# Patient Record
Sex: Female | Born: 1935 | Race: White | Hispanic: No | Smoking: Former smoker
Health system: Southern US, Community
[De-identification: ages and names within clinical notes are randomized; demographics above are authoritative.]

## PROBLEM LIST (undated history)

## (undated) DIAGNOSIS — K219 Gastro-esophageal reflux disease without esophagitis: Secondary | ICD-10-CM

## (undated) DIAGNOSIS — I700 Atherosclerosis of aorta: Secondary | ICD-10-CM

## (undated) DIAGNOSIS — Z7901 Long term (current) use of anticoagulants: Secondary | ICD-10-CM

## (undated) DIAGNOSIS — C3490 Malignant neoplasm of unspecified part of unspecified bronchus or lung: Secondary | ICD-10-CM

## (undated) DIAGNOSIS — I5022 Chronic systolic (congestive) heart failure: Secondary | ICD-10-CM

## (undated) DIAGNOSIS — M810 Age-related osteoporosis without current pathological fracture: Secondary | ICD-10-CM

## (undated) DIAGNOSIS — Z86718 Personal history of other venous thrombosis and embolism: Secondary | ICD-10-CM

## (undated) DIAGNOSIS — I484 Atypical atrial flutter: Secondary | ICD-10-CM

## (undated) DIAGNOSIS — J449 Chronic obstructive pulmonary disease, unspecified: Secondary | ICD-10-CM

## (undated) DIAGNOSIS — S329XXA Fracture of unspecified parts of lumbosacral spine and pelvis, initial encounter for closed fracture: Secondary | ICD-10-CM

## (undated) DIAGNOSIS — M4850XA Collapsed vertebra, not elsewhere classified, site unspecified, initial encounter for fracture: Secondary | ICD-10-CM

## (undated) DIAGNOSIS — G629 Polyneuropathy, unspecified: Secondary | ICD-10-CM

## (undated) DIAGNOSIS — Z9889 Other specified postprocedural states: Secondary | ICD-10-CM

## (undated) DIAGNOSIS — T7840XA Allergy, unspecified, initial encounter: Secondary | ICD-10-CM

## (undated) DIAGNOSIS — Z9289 Personal history of other medical treatment: Secondary | ICD-10-CM

## (undated) DIAGNOSIS — C3491 Malignant neoplasm of unspecified part of right bronchus or lung: Secondary | ICD-10-CM

## (undated) DIAGNOSIS — I255 Ischemic cardiomyopathy: Secondary | ICD-10-CM

## (undated) DIAGNOSIS — I10 Essential (primary) hypertension: Secondary | ICD-10-CM

## (undated) DIAGNOSIS — Z9221 Personal history of antineoplastic chemotherapy: Secondary | ICD-10-CM

## (undated) DIAGNOSIS — J9601 Acute respiratory failure with hypoxia: Secondary | ICD-10-CM

## (undated) DIAGNOSIS — R2689 Other abnormalities of gait and mobility: Secondary | ICD-10-CM

## (undated) DIAGNOSIS — T4145XA Adverse effect of unspecified anesthetic, initial encounter: Secondary | ICD-10-CM

## (undated) DIAGNOSIS — I2510 Atherosclerotic heart disease of native coronary artery without angina pectoris: Secondary | ICD-10-CM

## (undated) DIAGNOSIS — I219 Acute myocardial infarction, unspecified: Secondary | ICD-10-CM

## (undated) DIAGNOSIS — M1990 Unspecified osteoarthritis, unspecified site: Secondary | ICD-10-CM

## (undated) DIAGNOSIS — Z9981 Dependence on supplemental oxygen: Secondary | ICD-10-CM

## (undated) DIAGNOSIS — J189 Pneumonia, unspecified organism: Secondary | ICD-10-CM

## (undated) DIAGNOSIS — I471 Supraventricular tachycardia, unspecified: Secondary | ICD-10-CM

## (undated) DIAGNOSIS — Z923 Personal history of irradiation: Secondary | ICD-10-CM

## (undated) DIAGNOSIS — E039 Hypothyroidism, unspecified: Secondary | ICD-10-CM

## (undated) DIAGNOSIS — I480 Paroxysmal atrial fibrillation: Secondary | ICD-10-CM

## (undated) DIAGNOSIS — E785 Hyperlipidemia, unspecified: Secondary | ICD-10-CM

## (undated) DIAGNOSIS — Z950 Presence of cardiac pacemaker: Secondary | ICD-10-CM

## (undated) DIAGNOSIS — I340 Nonrheumatic mitral (valve) insufficiency: Secondary | ICD-10-CM

## (undated) DIAGNOSIS — T8859XA Other complications of anesthesia, initial encounter: Secondary | ICD-10-CM

## (undated) HISTORY — DX: Malignant neoplasm of unspecified part of right bronchus or lung: C34.91

## (undated) HISTORY — PX: APPENDECTOMY: SHX54

## (undated) HISTORY — PX: ECTOPIC PREGNANCY SURGERY: SHX613

## (undated) HISTORY — DX: Chronic systolic (congestive) heart failure: I50.22

## (undated) HISTORY — DX: Collapsed vertebra, not elsewhere classified, site unspecified, initial encounter for fracture: M48.50XA

## (undated) HISTORY — DX: Malignant neoplasm of unspecified part of unspecified bronchus or lung: C34.90

## (undated) HISTORY — DX: Presence of cardiac pacemaker: Z95.0

## (undated) HISTORY — DX: Hyperlipidemia, unspecified: E78.5

## (undated) HISTORY — DX: Fracture of unspecified parts of lumbosacral spine and pelvis, initial encounter for closed fracture: S32.9XXA

## (undated) HISTORY — PX: VAGINAL HYSTERECTOMY: SUR661

## (undated) HISTORY — DX: Allergy, unspecified, initial encounter: T78.40XA

## (undated) HISTORY — DX: Essential (primary) hypertension: I10

## (undated) HISTORY — DX: Acute respiratory failure with hypoxia: J96.01

## (undated) HISTORY — DX: Personal history of other medical treatment: Z92.89

## (undated) HISTORY — DX: Atherosclerotic heart disease of native coronary artery without angina pectoris: I25.10

## (undated) HISTORY — PX: EYE SURGERY: SHX253

## (undated) HISTORY — DX: Other specified postprocedural states: Z98.890

---

## 2000-08-30 DIAGNOSIS — Z951 Presence of aortocoronary bypass graft: Secondary | ICD-10-CM

## 2000-08-30 DIAGNOSIS — I219 Acute myocardial infarction, unspecified: Secondary | ICD-10-CM

## 2000-08-30 HISTORY — DX: Acute myocardial infarction, unspecified: I21.9

## 2000-08-30 HISTORY — DX: Presence of aortocoronary bypass graft: Z95.1

## 2000-08-30 HISTORY — PX: CORONARY ANGIOPLASTY WITH STENT PLACEMENT: SHX49

## 2000-09-30 HISTORY — PX: CORONARY ARTERY BYPASS GRAFT: SHX141

## 2001-12-28 DIAGNOSIS — I82402 Acute embolism and thrombosis of unspecified deep veins of left lower extremity: Secondary | ICD-10-CM

## 2001-12-28 HISTORY — DX: Acute embolism and thrombosis of unspecified deep veins of left lower extremity: I82.402

## 2009-01-30 ENCOUNTER — Ambulatory Visit: Payer: Self-pay | Admitting: Family Medicine

## 2009-12-28 HISTORY — PX: COLONOSCOPY: SHX174

## 2010-02-12 ENCOUNTER — Ambulatory Visit: Payer: Self-pay | Admitting: Family Medicine

## 2011-03-29 ENCOUNTER — Ambulatory Visit: Payer: Self-pay | Admitting: Family Medicine

## 2011-08-03 DIAGNOSIS — Z950 Presence of cardiac pacemaker: Secondary | ICD-10-CM | POA: Insufficient documentation

## 2011-08-03 DIAGNOSIS — I10 Essential (primary) hypertension: Secondary | ICD-10-CM | POA: Insufficient documentation

## 2011-08-31 DIAGNOSIS — Z9889 Other specified postprocedural states: Secondary | ICD-10-CM

## 2011-08-31 DIAGNOSIS — Z9289 Personal history of other medical treatment: Secondary | ICD-10-CM

## 2011-08-31 HISTORY — DX: Other specified postprocedural states: Z98.890

## 2011-08-31 HISTORY — DX: Personal history of other medical treatment: Z92.89

## 2012-03-30 HISTORY — PX: PACEMAKER INSERTION: SHX728

## 2012-04-07 ENCOUNTER — Ambulatory Visit: Payer: Self-pay | Admitting: Family Medicine

## 2012-04-24 DIAGNOSIS — I219 Acute myocardial infarction, unspecified: Secondary | ICD-10-CM | POA: Insufficient documentation

## 2012-04-24 DIAGNOSIS — J449 Chronic obstructive pulmonary disease, unspecified: Secondary | ICD-10-CM | POA: Insufficient documentation

## 2012-04-24 DIAGNOSIS — I459 Conduction disorder, unspecified: Secondary | ICD-10-CM | POA: Insufficient documentation

## 2013-05-17 ENCOUNTER — Ambulatory Visit: Payer: Self-pay | Admitting: Family Medicine

## 2013-06-07 ENCOUNTER — Ambulatory Visit: Payer: Self-pay | Admitting: Family Medicine

## 2013-06-18 ENCOUNTER — Ambulatory Visit: Payer: Self-pay | Admitting: Physician Assistant

## 2013-06-29 ENCOUNTER — Ambulatory Visit: Payer: Self-pay | Admitting: Physician Assistant

## 2013-07-02 ENCOUNTER — Ambulatory Visit: Payer: Self-pay | Admitting: Physician Assistant

## 2013-07-27 HISTORY — PX: SVT ABLATION: EP1225

## 2013-08-30 DIAGNOSIS — Z9289 Personal history of other medical treatment: Secondary | ICD-10-CM

## 2013-08-30 HISTORY — DX: Personal history of other medical treatment: Z92.89

## 2013-11-16 DIAGNOSIS — I4892 Unspecified atrial flutter: Secondary | ICD-10-CM | POA: Insufficient documentation

## 2014-08-28 ENCOUNTER — Ambulatory Visit: Payer: Self-pay | Admitting: Family Medicine

## 2014-09-09 ENCOUNTER — Ambulatory Visit (INDEPENDENT_AMBULATORY_CARE_PROVIDER_SITE_OTHER): Payer: Medicare Other | Admitting: Internal Medicine

## 2014-09-09 ENCOUNTER — Encounter: Payer: Self-pay | Admitting: Internal Medicine

## 2014-09-09 VITALS — BP 144/80 | HR 84 | Temp 97.8°F | Ht 67.0 in | Wt 168.0 lb

## 2014-09-09 DIAGNOSIS — R0600 Dyspnea, unspecified: Secondary | ICD-10-CM

## 2014-09-09 DIAGNOSIS — R059 Cough, unspecified: Secondary | ICD-10-CM | POA: Insufficient documentation

## 2014-09-09 DIAGNOSIS — R05 Cough: Secondary | ICD-10-CM

## 2014-09-09 MED ORDER — HYDROCODONE-ACETAMINOPHEN 5-325 MG PO TABS: 1.0000 | ORAL_TABLET | Freq: Four times a day (QID) | ORAL | Status: DC | PRN

## 2014-09-09 NOTE — Progress Notes (Signed)
Date: 09/09/2014  MRN# [id number] Jenny Cooper [birth year]  Referring Physician: [name] PMD - Dr. Gayland Curry Jenny Cooper is a 79 y.o. old female seen in consultation for chronic cough  CC:  Chief Complaint  Jenny Cooper presents with  . Pulmonary Consult    Referred by [name] for persistent cough.    HPI:  Jenny Cooper is a pleasant 79 year old female presents today for a evaluation of chronic cough. She is accompanied by her daughter. Previous records, images, labs reviewed. History of cough as stated below: Cough is productive, clear thick to foamy at times, accompanied with coughing spells. There are No known exacerbating factors, not worse in the mornings or laying flat. Cough started - 05/2013 treated with some OTC med, got worst in Summer 2015 (daily), [name] started PPI, with little improvement. Then had allergy skin testing, by [name], which showed no significant allergens, was placed on Astelin and Benzonate, qvar, albuterol. Self referred to [name] in November 2015, had 2 laryngoscopies (1st scope - posterior laryngeal erythema, taken off pantoprazole and place on prilosec, 2nd scope on 08/26/14- resolution of laryngeal erythema, but Jenny Cooper still had cough, even while on prilosec). Jenny Cooper describes the cough as "liquid feeling in thorat" and "throat clearing feeling." Has experienced post tussive enemsis, especially after a coughing spell. Currently only taking Prilosec and Astelin.  No wheezing, only after a coughing spell, and then its more in the throat area per the Jenny Cooper. Jenny Cooper also endorses some dyspnea, mostly after a coughing spell.  PMHX:   Past Medical History  Diagnosis Date  . HTN (hypertension)   . Pacemaker   . Coronary artery disease   . HLD (hyperlipidemia)    Surgical Hx:  Past Surgical History  Procedure Laterality Date  . Appendectomy    . Vaginal hysterectomy    . Cardiac bypass     Family Hx:  Family  History  Problem Relation Age of Onset  . COPD Mother     sister, and brother   Social Hx:   History  Substance Use Topics  . Smoking status: Former Smoker -- 2.00 packs/day for 42 years    Types: Cigarettes    Quit date: 08/30/2000  . Smokeless tobacco: Not on file  . Alcohol Use: 6.0 oz/week    10 Glasses of wine per week     Comment: 2 glasses of wine per day   Medication:   Current Outpatient Rx  Name  Route  Sig  Dispense  Refill  . aspirin 81 MG tablet   Oral   Take 81 mg by mouth daily.         Marland Kitchen atenolol (TENORMIN) 25 MG tablet   Oral   Take 25 mg by mouth daily.         Marland Kitchen atorvastatin (LIPITOR) 20 MG tablet   Oral   Take 20 mg by mouth daily.         Marland Kitchen azelastine (ASTELIN) 0.1 % nasal spray   Each Nare   Place 2 sprays into both nostrils 2 (two) times daily. Use in each nostril as directed         . benzonatate (TESSALON) 200 MG capsule   Oral   Take 200 mg by mouth 3 (three) times daily as needed for cough.         . calcium carbonate (OS-CAL) 600 MG TABS tablet   Oral   Take 600 mg by mouth 2 (two) times daily with a meal.         .  ELIQUIS 5 MG TABS tablet   Oral   Take 1 tablet by mouth daily.      11   . hydrochlorothiazide (MICROZIDE) 12.5 MG capsule   Oral   Take 12.5 mg by mouth daily.         Marland Kitchen levothyroxine (SYNTHROID, LEVOTHROID) 75 MCG tablet   Oral   Take 75 mcg by mouth daily before breakfast.         . losartan (COZAAR) 25 MG tablet   Oral   Take 25 mg by mouth daily.         . Multiple Vitamin (MULTIVITAMIN) tablet   Oral   Take 1 tablet by mouth daily.         Marland Kitchen omeprazole (PRILOSEC) 20 MG capsule   Oral   Take 20 mg by mouth daily.             Allergies:  Lovenox  Review of Systems: Gen:  Denies  fever, sweats, chills HEENT: Denies blurred vision, double vision, ear pain, eye pain, hearing loss, nose bleeds, sore throat Cvc:  No dizziness, chest pain or heaviness Resp:   Cough (mild  production, foamy at times) Gi: Denies swallowing difficulty, stomach pain, nausea or vomiting, diarrhea, constipation, bowel incontinence Gu:  Denies bladder incontinence, burning urine Ext:   No Joint pain, stiffness or swelling Skin: No skin rash, easy bruising or bleeding or hives Endoc:  No polyuria, polydipsia , polyphagia or weight change Psych: No depression, insomnia or hallucinations  Other:  All other systems negative  Physical Examination:   VS: BP 144/80 mmHg  Pulse 84  Temp(Src) 97.8 F (36.6 C) (Oral)  Ht 5\' 7"  (1.702 m)  Wt 168 lb (76.204 kg)  BMI 26.31 kg/m2  SpO2 95%  General Appearance: No distress  Neuro:without focal findings, mental status, speech normal, alert and oriented, cranial nerves 2-12 intact, reflexes normal and symmetric, sensation grossly normal  HEENT: PERRLA, EOM intact, no ptosis, no other lesions noticed; Mallampati 2 Pulmonary: normal breath sounds., diaphragmatic excursion normal.No wheezing, No rales;   Sputum Production:  none CardiovascularNormal S1,S2.  No m/r/g.  Abdominal aorta pulsation normal.    Abdomen: Benign, Soft, non-tender, No masses, hepatosplenomegaly, No lymphadenopathy Renal:  No costovertebral tenderness  GU:  No performed at this time. Endoc: No evident thyromegaly, no signs of acromegaly or Cushing features Skin:   warm, no rashes, no ecchymosis  Extremities: normal, no cyanosis, clubbing, no edema, warm with normal capillary refill. Other findings:   Rad results: (The following images and results were reviewed by Dr. Stevenson Clinch). CXR 02/18/14 1. Stable heart and mediastinum post median sternotomy and mitral valve repair. There is a left-sided pacemaker with right atrial and right ventricular leads. The pacemaker generator has flipped position in comparison to prior. Recommend correlation for possible pacemaker pocket infection. 2. Right apical pleural thickening is unchanged. Lungs and pleural spaces are otherwise within  normal limits without acute abnormality.   ECHO 03/2014 MILD LV DYSFUNCTION  NORMAL RIGHT VENTRICULAR SYSTOLIC FUNCTION VALVULAR REGURGITATION: MILD MR, TRIVIAL PR, MILD TR PROSTHETIC VALVE(S): PROSTHETIC MV RING    Assessment and Plan: Cough The standardized cough guidelines published in Chest by Lissa Morales in 2006 are still the best available and consist of a multiple step process (up to 12!) , not a single office visit,  and are intended  to address this problem logically,  with an alogrithm dependent on response to empiric treatment at  each progressive step  to determine a  specific diagnosis with  minimal addtional testing needed. Therefore if adherence is an issue or can't be accurately verified,  it's very unlikely the standard evaluation and treatment will be successful here.    Furthermore, response to therapy (other than acute cough suppression, which should only be used short term with avoidance of narcotic containing cough syrups if possible), can be a gradual process for which the Jenny Cooper may perceive immediate benefit.  Unlike going to an eye doctor where the best perscription is almost always the first one and is immediately effective, this is almost never the case in the management of chronic cough syndromes. Therefore the Jenny Cooper needs to commit up front to consistently adhere to recommendations  for up to 6 weeks of therapy directed at the likely underlying problem(s) before the response can be reasonably evaluated.  Differential diagnosis includes: COPD, nonasthmatic eosinophilic bronchitis, asthma, recurrent allergy exposure Given her clinical symptoms and prolonged smoking history, COPD is high in the differential. However, will further evaluate obstructive, restrictive disease with pulmonary function testing. Given her recurrence of infections will also further evaluate lung parenchyma with CT chest with contrast. Will also check pulmonary function testing and 6 minute  walk test. For her coughing spells, will give a prescription for hydrocodone (Jenny Cooper educated and advise on usage, dosage, administration of this medication). Jenny Cooper advised to continue with Prilosec and Astelin.     Updated Medication List Outpatient Encounter Prescriptions as of 09/09/2014  Medication Sig  . aspirin 81 MG tablet Take 81 mg by mouth daily.  Marland Kitchen atenolol (TENORMIN) 25 MG tablet Take 25 mg by mouth daily.  Marland Kitchen atorvastatin (LIPITOR) 20 MG tablet Take 20 mg by mouth daily.  Marland Kitchen azelastine (ASTELIN) 0.1 % nasal spray Place 2 sprays into both nostrils 2 (two) times daily. Use in each nostril as directed  . benzonatate (TESSALON) 200 MG capsule Take 200 mg by mouth 3 (three) times daily as needed for cough.  . calcium carbonate (OS-CAL) 600 MG TABS tablet Take 600 mg by mouth 2 (two) times daily with a meal.  . ELIQUIS 5 MG TABS tablet Take 1 tablet by mouth daily.  . hydrochlorothiazide (MICROZIDE) 12.5 MG capsule Take 12.5 mg by mouth daily.  Marland Kitchen levothyroxine (SYNTHROID, LEVOTHROID) 75 MCG tablet Take 75 mcg by mouth daily before breakfast.  . losartan (COZAAR) 25 MG tablet Take 25 mg by mouth daily.  . Multiple Vitamin (MULTIVITAMIN) tablet Take 1 tablet by mouth daily.  Marland Kitchen omeprazole (PRILOSEC) 20 MG capsule Take 20 mg by mouth daily.  Marland Kitchen HYDROcodone-acetaminophen (NORCO/VICODIN) 5-325 MG per tablet Take 1 tablet by mouth every 6 (six) hours as needed (for cough).    Orders for this visit: Orders Placed This Encounter  Procedures  . CT Chest W Contrast    Standing Status: Future     Number of Occurrences:      Standing Expiration Date: 11/08/2015    Order Specific Question:  Reason for Exam (SYMPTOM  OR DIAGNOSIS REQUIRED)    Answer:  cough, shortness of breath    Order Specific Question:  Preferred imaging location?    Answer:  [facility]  . Basic Metabolic Panel (BMET)    Standing Status: Future     Number of Occurrences:      Standing Expiration Date: 09/09/2015     Order Specific Question:  Has the Jenny Cooper fasted?    Answer:  No  . Pulmonary function test    Standing Status: Future     Number of  Occurrences:      Standing Expiration Date: 09/10/2015    Order Specific Question:  Where should this test be performed?    Answer:  [facility]    Order Specific Question:  Full PFT: includes the following: basic spirometry, spirometry pre & post bronchodilator, diffusion capacity (DLCO), lung volumes    Answer:  Full PFT    Order Specific Question:  MIP/MEP    Answer:  No    Order Specific Question:  6 minute walk    Answer:  No    Order Specific Question:  ABG    Answer:  No    Order Specific Question:  Diffusion capacity (DLCO)    Answer:  Yes    Order Specific Question:  Lung volumes    Answer:  Yes    Order Specific Question:  Methacholine challenge    Answer:  No     Thank  you for the consultation and for allowing [facility] to assist in the care of your Jenny Cooper. Our recommendations are noted above.  Please contact us if we can be of further service.   Vilinda Boehringer, MD [facility] Office Number: [phone]

## 2014-09-09 NOTE — Patient Instructions (Signed)
We will get you scheduled for a CT scan, Pulmonary Function Test and a Walk Test. Rx will be given to you for Hydrocodone to help with your cough. Follow up with Dr. Stevenson Clinch in 2 weeks.

## 2014-09-10 NOTE — Assessment & Plan Note (Signed)
The standardized cough guidelines published in Chest by Lissa Morales in 2006 are still the best available and consist of a multiple step process (up to 12!) , not a single office visit,  and are intended  to address this problem logically,  with an alogrithm dependent on response to empiric treatment at  each progressive step  to determine a specific diagnosis with  minimal addtional testing needed. Therefore if adherence is an issue or can't be accurately verified,  it's very unlikely the standard evaluation and treatment will be successful here.    Furthermore, response to therapy (other than acute cough suppression, which should only be used short term with avoidance of narcotic containing cough syrups if possible), can be a gradual process for which the patient may perceive immediate benefit.  Unlike going to an eye doctor where the best perscription is almost always the first one and is immediately effective, this is almost never the case in the management of chronic cough syndromes. Therefore the patient needs to commit up front to consistently adhere to recommendations  for up to 6 weeks of therapy directed at the likely underlying problem(s) before the response can be reasonably evaluated.  Differential diagnosis includes: COPD, nonasthmatic eosinophilic bronchitis, asthma, recurrent allergy exposure Given her clinical symptoms and prolonged smoking history, COPD is high in the differential. However, will further evaluate obstructive, restrictive disease with pulmonary function testing. Given her recurrence of infections will also further evaluate lung parenchyma with CT chest with contrast. Will also check pulmonary function testing and 6 minute walk test. For her coughing spells, will give a prescription for hydrocodone (patient educated and advise on usage, dosage, administration of this medication). Patient advised to continue with Prilosec and Astelin.

## 2014-09-11 ENCOUNTER — Encounter: Payer: Self-pay | Admitting: *Deleted

## 2014-09-12 ENCOUNTER — Telehealth: Payer: Self-pay | Admitting: Internal Medicine

## 2014-09-13 NOTE — Telephone Encounter (Signed)
Called [facility] schedulers and they changed appt to the [facility] office same day 09/17/14@9am  pt aware Joellen Jersey

## 2014-09-17 ENCOUNTER — Other Ambulatory Visit: Payer: Self-pay | Admitting: Internal Medicine

## 2014-09-17 ENCOUNTER — Ambulatory Visit: Payer: Self-pay | Admitting: Internal Medicine

## 2014-09-17 ENCOUNTER — Telehealth: Payer: Self-pay | Admitting: Internal Medicine

## 2014-09-17 DIAGNOSIS — R918 Other nonspecific abnormal finding of lung field: Secondary | ICD-10-CM

## 2014-09-17 NOTE — Telephone Encounter (Signed)
Called and spoke with patient informed her of the results of her CT Scan - Right lung mass, with possible endobronchial lesion. Plan to perform follow up PET-CT scan. Patient in agreement with above plan.

## 2014-09-17 NOTE — Telephone Encounter (Signed)
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  This causes post obstructive  Inflammation changes in the right upper lobe and superior segment of right lower lobe."  Will forward message to VM

## 2014-09-17 NOTE — Telephone Encounter (Signed)
Called and spoke with patient.  See phone note.

## 2014-09-19 ENCOUNTER — Ambulatory Visit: Payer: Self-pay | Admitting: Oncology

## 2014-09-19 LAB — COMPREHENSIVE METABOLIC PANEL
ALBUMIN: 3.6 g/dL (ref 3.4–5.0)
AST: 29 U/L (ref 15–37)
Alkaline Phosphatase: 79 U/L
Anion Gap: 6 — ABNORMAL LOW (ref 7–16)
BUN: 18 mg/dL (ref 7–18)
Bilirubin,Total: 0.3 mg/dL (ref 0.2–1.0)
CO2: 31 mmol/L (ref 21–32)
Calcium, Total: 9.2 mg/dL (ref 8.5–10.1)
Chloride: 100 mmol/L (ref 98–107)
Creatinine: 0.95 mg/dL (ref 0.60–1.30)
EGFR (Non-African Amer.): >60
GLUCOSE: 104 mg/dL — AB (ref 65–99)
Osmolality: 276 (ref 275–301)
Potassium: 3.6 mmol/L (ref 3.5–5.1)
SGPT (ALT): 19 U/L
Sodium: 137 mmol/L (ref 136–145)
Total Protein: 7.9 g/dL (ref 6.4–8.2)

## 2014-09-19 LAB — CBC CANCER CENTER
BASOS ABS: 0 x10 3/mm (ref 0.0–0.1)
Basophil %: 0.6 %
Eosinophil #: 0.6 x10 3/mm (ref 0.0–0.7)
Eosinophil %: 8.7 %
HCT: 42.2 % (ref 35.0–47.0)
HGB: 13.8 g/dL (ref 12.0–16.0)
Lymphocyte #: 1.4 x10 3/mm (ref 1.0–3.6)
Lymphocyte %: 20.6 %
MCH: 29.4 pg (ref 26.0–34.0)
MCHC: 32.8 g/dL (ref 32.0–36.0)
MCV: 90 fL (ref 80–100)
MONO ABS: 0.9 x10 3/mm (ref 0.2–0.9)
Monocyte %: 13.3 %
NEUTROS ABS: 3.9 x10 3/mm (ref 1.4–6.5)
Neutrophil %: 56.8 %
PLATELETS: 229 x10 3/mm (ref 150–440)
RBC: 4.7 10*6/uL (ref 3.80–5.20)
RDW: 13.7 % (ref 11.5–14.5)
WBC: 6.9 x10 3/mm (ref 3.6–11.0)

## 2014-09-19 LAB — APTT: Activated PTT: 42.6 secs — ABNORMAL HIGH (ref 23.6–35.9)

## 2014-09-19 LAB — PROTIME-INR
INR: 1.1
PROTHROMBIN TIME: 14.2 s (ref 11.5–14.7)

## 2014-09-23 ENCOUNTER — Ambulatory Visit: Payer: Self-pay | Admitting: Internal Medicine

## 2014-09-23 ENCOUNTER — Ambulatory Visit: Payer: Self-pay | Admitting: Oncology

## 2014-09-23 DIAGNOSIS — I213 ST elevation (STEMI) myocardial infarction of unspecified site: Secondary | ICD-10-CM

## 2014-09-24 ENCOUNTER — Encounter: Payer: Self-pay | Admitting: Internal Medicine

## 2014-09-24 DIAGNOSIS — J9601 Acute respiratory failure with hypoxia: Secondary | ICD-10-CM

## 2014-09-24 DIAGNOSIS — R918 Other nonspecific abnormal finding of lung field: Secondary | ICD-10-CM

## 2014-09-25 ENCOUNTER — Ambulatory Visit: Payer: PRIVATE HEALTH INSURANCE

## 2014-09-25 ENCOUNTER — Inpatient Hospital Stay: Payer: Self-pay | Admitting: Specialist

## 2014-09-25 ENCOUNTER — Other Ambulatory Visit: Payer: Self-pay | Admitting: *Deleted

## 2014-09-25 DIAGNOSIS — R059 Cough, unspecified: Secondary | ICD-10-CM

## 2014-09-25 DIAGNOSIS — R0600 Dyspnea, unspecified: Secondary | ICD-10-CM

## 2014-09-25 DIAGNOSIS — R918 Other nonspecific abnormal finding of lung field: Secondary | ICD-10-CM | POA: Diagnosis not present

## 2014-09-25 DIAGNOSIS — J9601 Acute respiratory failure with hypoxia: Secondary | ICD-10-CM | POA: Diagnosis not present

## 2014-09-25 DIAGNOSIS — R05 Cough: Secondary | ICD-10-CM

## 2014-09-26 ENCOUNTER — Ambulatory Visit: Payer: PRIVATE HEALTH INSURANCE | Admitting: Internal Medicine

## 2014-09-29 LAB — BRONCHIAL WASH CULTURE

## 2014-09-30 ENCOUNTER — Ambulatory Visit: Payer: Self-pay | Admitting: Oncology

## 2014-10-02 ENCOUNTER — Telehealth: Payer: Self-pay | Admitting: *Deleted

## 2014-10-02 NOTE — Telephone Encounter (Signed)
Pt needs to reschedule 6 min walk and pft, the f/u appt with Dr. Stevenson Clinch next week.

## 2014-10-04 ENCOUNTER — Telehealth: Payer: Self-pay | Admitting: Internal Medicine

## 2014-10-04 DIAGNOSIS — C3490 Malignant neoplasm of unspecified part of unspecified bronchus or lung: Secondary | ICD-10-CM | POA: Insufficient documentation

## 2014-10-04 DIAGNOSIS — G64 Other disorders of peripheral nervous system: Secondary | ICD-10-CM | POA: Insufficient documentation

## 2014-10-04 DIAGNOSIS — M5412 Radiculopathy, cervical region: Secondary | ICD-10-CM | POA: Insufficient documentation

## 2014-10-04 NOTE — Telephone Encounter (Signed)
atc pt's daughter Celene Skeen, line busy.  Wcb.

## 2014-10-07 NOTE — Telephone Encounter (Signed)
lmtcb x1 

## 2014-10-08 NOTE — Telephone Encounter (Signed)
We will have to schedule for the next available. The 15th is already booked for PFT and SMW.

## 2014-10-08 NOTE — Telephone Encounter (Signed)
lmtcb for daughter 

## 2014-10-08 NOTE — Telephone Encounter (Signed)
° °  Jenny Cooper returned call [phone]

## 2014-10-08 NOTE — Telephone Encounter (Signed)
Per Dr. Stevenson Clinch, he would still like patient to have PFT, SMW sooner the better. I spoke with the patient's daughter and she informed me that her mother will start chemo on 10/15/14. There is scheduling conflict as they would prefer to have it done on Monday 10/14/14. Ria Comment please help me figure this one out. Thanks.

## 2014-10-10 ENCOUNTER — Ambulatory Visit: Payer: Self-pay | Admitting: Vascular Surgery

## 2014-10-10 NOTE — Telephone Encounter (Signed)
Pt has been scheduled for Mon. Feb 15th, 2016.

## 2014-10-14 ENCOUNTER — Ambulatory Visit: Payer: PRIVATE HEALTH INSURANCE

## 2014-10-16 ENCOUNTER — Other Ambulatory Visit: Payer: Self-pay | Admitting: Internal Medicine

## 2014-10-16 ENCOUNTER — Telehealth: Payer: Self-pay | Admitting: *Deleted

## 2014-10-16 DIAGNOSIS — R0602 Shortness of breath: Secondary | ICD-10-CM

## 2014-10-16 NOTE — Telephone Encounter (Signed)
Due to our PFT machine being down in [place]. Patient is scheduled for PFT, SMW at [facility] 10/17/14 10:30. Pts daughter aware of change and will inform her mother.

## 2014-10-17 ENCOUNTER — Ambulatory Visit: Payer: PRIVATE HEALTH INSURANCE

## 2014-10-17 ENCOUNTER — Ambulatory Visit: Payer: Self-pay | Admitting: Internal Medicine

## 2014-10-21 LAB — PULMONARY FUNCTION TEST

## 2014-10-29 ENCOUNTER — Ambulatory Visit
Admit: 2014-10-29 | Disposition: A | Discharge status: Home / Self Care | Payer: Self-pay | Attending: Oncology | Admitting: Oncology

## 2014-11-26 LAB — CBC CANCER CENTER
BASOS ABS: 0 x10 3/mm (ref 0.0–0.1)
Basophil %: 0.6 %
EOS ABS: 0 x10 3/mm (ref 0.0–0.7)
Eosinophil %: 1.3 %
HCT: 34.4 % — ABNORMAL LOW (ref 35.0–47.0)
HGB: 11.4 g/dL — ABNORMAL LOW (ref 12.0–16.0)
Lymphocyte #: 0.4 x10 3/mm — ABNORMAL LOW (ref 1.0–3.6)
Lymphocyte %: 20.4 %
MCH: 29.2 pg (ref 26.0–34.0)
MCHC: 33.1 g/dL (ref 32.0–36.0)
MCV: 88 fL (ref 80–100)
Monocyte #: 0.3 x10 3/mm (ref 0.2–0.9)
Monocyte %: 16.5 %
NEUTROS ABS: 1.2 x10 3/mm — AB (ref 1.4–6.5)
Neutrophil %: 61.2 %
PLATELETS: 155 x10 3/mm (ref 150–440)
RBC: 3.91 10*6/uL (ref 3.80–5.20)
RDW: 15.9 % — ABNORMAL HIGH (ref 11.5–14.5)
WBC: 2 x10 3/mm — CL (ref 3.6–11.0)

## 2014-11-26 LAB — BASIC METABOLIC PANEL
Anion Gap: 2 — ABNORMAL LOW (ref 7–16)
BUN: 20 mg/dL
CREATININE: 0.89 mg/dL
Calcium, Total: 8.5 mg/dL — ABNORMAL LOW
Chloride: 101 mmol/L
Co2: 31 mmol/L
EGFR (African American): >60
EGFR (Non-African Amer.): >60
GLUCOSE: 122 mg/dL — AB
POTASSIUM: 3.4 mmol/L — AB
SODIUM: 134 mmol/L — AB

## 2014-11-29 ENCOUNTER — Ambulatory Visit
Admit: 2014-11-29 | Disposition: A | Discharge status: Home / Self Care | Payer: Self-pay | Attending: Oncology | Admitting: Oncology

## 2014-11-30 LAB — CREATININE, SERUM: Creatine, Serum: 0.89

## 2014-12-02 LAB — BASIC METABOLIC PANEL
Anion Gap: 10 (ref 7–16)
BUN: 37 mg/dL — ABNORMAL HIGH
CHLORIDE: 95 mmol/L — AB
Calcium, Total: 9.7 mg/dL
Co2: 27 mmol/L
Creatinine: 1.37 mg/dL — ABNORMAL HIGH
EGFR (African American): 43 — ABNORMAL LOW
EGFR (Non-African Amer.): 37 — ABNORMAL LOW
Glucose: 167 mg/dL — ABNORMAL HIGH
POTASSIUM: 3.7 mmol/L
SODIUM: 132 mmol/L — AB

## 2014-12-02 LAB — CBC CANCER CENTER
Basophil #: 0 x10 3/mm (ref 0.0–0.1)
Basophil %: 0.8 %
EOS PCT: 0.9 %
Eosinophil #: 0 x10 3/mm (ref 0.0–0.7)
HCT: 37.5 % (ref 35.0–47.0)
HGB: 12.6 g/dL (ref 12.0–16.0)
LYMPHS PCT: 16.4 %
Lymphocyte #: 0.4 x10 3/mm — ABNORMAL LOW (ref 1.0–3.6)
MCH: 29.6 pg (ref 26.0–34.0)
MCHC: 33.6 g/dL (ref 32.0–36.0)
MCV: 88 fL (ref 80–100)
MONO ABS: 0.4 x10 3/mm (ref 0.2–0.9)
Monocyte %: 15.3 %
NEUTROS PCT: 66.6 %
Neutrophil #: 1.8 x10 3/mm (ref 1.4–6.5)
PLATELETS: 166 x10 3/mm (ref 150–440)
RBC: 4.26 10*6/uL (ref 3.80–5.20)
RDW: 16.9 % — ABNORMAL HIGH (ref 11.5–14.5)
WBC: 2.7 x10 3/mm — ABNORMAL LOW (ref 3.6–11.0)

## 2014-12-23 LAB — CYTOLOGY - NON PAP

## 2014-12-24 LAB — COMPREHENSIVE METABOLIC PANEL
ALBUMIN: 3.9 g/dL
ALK PHOS: 65 U/L
Anion Gap: 6 — ABNORMAL LOW (ref 7–16)
BUN: 17 mg/dL
Bilirubin,Total: 0.4 mg/dL
CALCIUM: 9.5 mg/dL
CREATININE: 1 mg/dL
Chloride: 98 mmol/L — ABNORMAL LOW
Co2: 32 mmol/L
EGFR (African American): >60
EGFR (Non-African Amer.): 55 — ABNORMAL LOW
Glucose: 107 mg/dL — ABNORMAL HIGH
POTASSIUM: 3.6 mmol/L
SGOT(AST): 22 U/L
SGPT (ALT): 19 U/L
Sodium: 136 mmol/L
Total Protein: 7.4 g/dL

## 2014-12-24 LAB — CBC CANCER CENTER
BASOS PCT: 0.5 %
Basophil #: 0 x10 3/mm (ref 0.0–0.1)
EOS PCT: 5.7 %
Eosinophil #: 0.2 x10 3/mm (ref 0.0–0.7)
HCT: 36.8 % (ref 35.0–47.0)
HGB: 12.2 g/dL (ref 12.0–16.0)
LYMPHS ABS: 1 x10 3/mm (ref 1.0–3.6)
Lymphocyte %: 28.4 %
MCH: 30.5 pg (ref 26.0–34.0)
MCHC: 33.2 g/dL (ref 32.0–36.0)
MCV: 92 fL (ref 80–100)
MONO ABS: 0.8 x10 3/mm (ref 0.2–0.9)
MONOS PCT: 21.6 %
Neutrophil #: 1.6 x10 3/mm (ref 1.4–6.5)
Neutrophil %: 43.8 %
PLATELETS: 191 x10 3/mm (ref 150–440)
RBC: 4.01 10*6/uL (ref 3.80–5.20)
RDW: 21.9 % — ABNORMAL HIGH (ref 11.5–14.5)
WBC: 3.6 x10 3/mm (ref 3.6–11.0)

## 2014-12-25 ENCOUNTER — Other Ambulatory Visit: Payer: Self-pay | Admitting: Oncology

## 2014-12-25 DIAGNOSIS — C3490 Malignant neoplasm of unspecified part of unspecified bronchus or lung: Secondary | ICD-10-CM

## 2014-12-29 NOTE — Discharge Summary (Signed)
PATIENT NAME:  Jenny Cooper, Jenny Cooper MR#:  [id number] DATE OF BIRTH:  01-Aug-1936  DATE OF ADMISSION:  09/25/2014 DATE OF DISCHARGE:  09/25/2014  DISCHARGE DIAGNOSES: 1. Hypoxia after bronchoscopy.  2. Chronic obstructive pulmonary disease.  3. Coronary artery disease with coronary artery bypass graft.  4. Hypertension.  5. permanent  atrial fibrillation.  6. Gastroesophageal reflux disease.  7. Hyperlipidemia.   DISCHARGE MEDICATIONS: Aspirin 81 mg p.o. daily,atenolol '25mg'$   p.o. daily, atorvastatin 20 mg p.o. at bedtime, calcium carbonate 600 mg p.o. b.i.d., Eliquis 5 mg p.o. daily, hydrochlorothiazide 12.5 mg p.o. daily, levothyroxine 75 mcg p.o. daily, losartan 25 mg p.o. daily, Norco 5/325 one tablet every 6 hours as needed, omeprazole 20 mg p.o. daily.   HOME OXYGEN: Discharged home with 2 liters of oxygen.   FOLLOWUP APPOINTMENTS: Follow up with Dr. Stevenson Clinch in about 10 days, regarding biopsy results. Also, her primary doctor is Dr. Gayland Curry, advised her to follow up with her in 10 days.   CONSULTATIONS: Dr. Stevenson Clinch.  HOSPITAL COURSE: This patient is a 79 year old female patient, who came to the hospital for elective bronchoscopy. The patient has a right upper lobe lung mass, and she had a bronchoscopy done by Dr. Stevenson Clinch. After the bronchoscopy, she had hypoxia, where saturations dropped 70s to 80s on room air. The patient was not on oxygen before. At the same time, she became cyanotic with low oxygenation, so hospitalist was consulted for admission. The patient's was put on 3 liters with saturations of 97%. The patient thought to have acute hypoxia, probably related to mild atelectasis versus chronic obstructive pulmonary disease, and the patient was continued on incentive spirometer, DuoNeb, Combivent. The patient seen by Dr. Stevenson Clinch. She had a lot of sore throats due to bronchoscopy, so she wanted Cepacol lozenges, which we started. The patient was seen by physical therapy. On ambulation,  her saturation dropped, so she continued to require oxygen 2 liters support. On room air, saturations were 84%, and they quickly went up to 92% on 2 liters.   Concerning her hypoxia, we did a CT angiogram of the chest to evaluate for pulmonary embolism and CT angiogram chest was negative for pulmonary embolus. The patient did not have any cough or wheezing. The patient was seen by Dr. Stevenson Clinch. He said he will take care of her biopsy results and also weaning off oxygen in the clinic. The patient discharged home with oxygen 2 liters. The patient did not have any fever. Given the patient's CT chest showed pneumonia, she did not have any fever, and it could be post obstructive pneumonia.   PHYSICAL EXAMINATION ON DISCHARGE: DISCHARGE VITAL SIGNS: Temperature 97.8, heart rate 63, blood pressure 128/68, saturations 94% on 2 liters and 84% on room air with exertion. Discharged home with oxygen.  CARDIOVASCULAR: S1, S2.  LUNGS: Clear to auscultation. No wheeze. No rales.  ABDOMEN: Soft, nontender, nondistended. Bowel sounds present.   The patient was taking Eliquis before for atrial fibrillation, so we advised her to continue Eliquis.   TIME SPENT: More than 30 minutes    ____________________________ Epifanio Lesches, MD sk:mw D: 09/28/2014 09:54:02 ET T: 09/28/2014 19:51:28 ET JOB#: [id number]  cc: Epifanio Lesches, MD, <Dictator> Vilinda Boehringer, MD Floria Raveling. Astrid Divine, MD Epifanio Lesches MD ELECTRONICALLY SIGNED 10/01/2014 13:40

## 2014-12-29 NOTE — Consult Note (Signed)
Reason for Visit: This 79 year old Female patient presents to the clinic for initial evaluation of  lung cancer .   Referred by Dr. Oliva Bustard.  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 Tumor invaded the right mainstem bronchus with narrowing of vascular structures. PET CT confirmed a 1-7/5 cm hyperbolic upper hypermetabolic mass in the right perihilar area contiguous with adjacent hilar structures with underlying right hilar malignant adenopathy.patient underwent and bronchial ultrasound demonstrated a squamous cell carcinoma involving 4R station lymph nodes with extrinsic compression of the right mainstem bronchus. She was [facility] after her bronchitis secondary to hypoxia and currently is on oxygen therapy. She has a past medical history significant for significant coronary artery disease, DVT and pulmonary embolism. She's been seen by medical oncology and surgical oncology and is now referred to radiation oncology for opinion. She specifically denies hemoptysis at this time.  Past Hx:    CAD:    CAD:    HTN:    CABG (Coronary Artery Bypass Graft):   Past, Family and Social History:  Past Medical History positive   Cardiovascular CABG performed; coronary artery disease; hypertension   Respiratory COPD; pulmonary embolism   Past Surgical History appendectomy; fallopian tube removal, hysterectomy   Past Medical History Comments DVT   Family History positive    Family History Comments family history of brother with colon cancer multiple family members with COPD   Social History positive   Social History Comments 40 pack year smoking history quit smoking 15 years prior no EtOH use history   Additional Past Medical and Surgical History seen today accompanied by 2 daughters   Allergies:   Lovenox: Rash  Demerol HCl: Other  Home Meds:  Home Medications: Medication Instructions Status  albuterol-ipratropium 2.5 mg-0.5 mg/3 mL inhalation solution 3 milliliter(s) inhaled 2 times a day Active  Norco 325 mg-5 mg oral tablet 1 tab(s) orally every 6 hours, As Needed Active  Multivitamin - once a day 1   once a day Active  azelastine nasal 2 spray(s) nasal 2 times a day Active  omeprazole 20 mg oral delayed release capsule 1 cap(s) orally once a day (at bedtime) Active  aspirin 81 mg oral tablet 1 tab(s) orally once a day Active  atenolol 25 mg oral tablet 1 tab(s) orally once a day Active  atorvastatin 20 mg oral tablet 1 tab(s) orally once a day (at bedtime) Active  calcium carbonate 600 mg oral tablet  orally 2 times a day with a meal Active  Eliquis 5 mg oral tablet  orally once a day Active  hydrochlorothiazide 12.5 mg oral capsule 1 cap(s) orally once a day Active  levothyroxine 75 mcg (0.075 mg) oral tablet 1 tab(s) orally once a day Active  losartan 25 mg oral tablet 1 tab(s) orally once a day Active   Review of Systems:  General negative   Performance Status (ECOG) 0   Skin negative   Breast negative   Ophthalmologic negative   ENMT negative  Respiratory and Thorax see HPI   Cardiovascular see HPI   Gastrointestinal negative   Genitourinary negative   Musculoskeletal negative   Neurological negative   Psychiatric negative   Hematology/Lymphatics negative   Endocrine negative   Allergic/Immunologic negative   Nursing Notes:  Nursing Vital Signs and Chemo Nursing Nursing Notes: *CC Vital Signs Flowsheet:    29-Jan-16 13:21  Temp Temperature 96.8  Pulse Pulse 83  Respirations Respirations 18  SBP SBP 114  DBP DBP 65  Pain Scale (0-10)  0  Current Weight (kg) (kg) 71.9  Height (cm) centimeters 170  BSA (m2) 1.8   Physical Exam:  General/Skin/HEENT:  General normal   Skin normal   Eyes normal   ENMT normal   Head and Neck normal   Additional PE thin well-developed female on nasal oxygen in NAD. No cervical or subclavicular adenopathy is identified lungs are clear to A&P cardiac examination shows regular rate and rhythm. Abdomen is benign.   Breasts/Resp/CV/GI/GU:  Respiratory and Thorax normal   Cardiovascular normal   Gastrointestinal normal   Genitourinary normal   MS/Neuro/Psych/Lymph:  Musculoskeletal normal   Neurological normal   Lymphatics normal   Other Results:  Radiology Results: CT:    19-Jan-16 09:58, CT Chest With Contrast  CT Chest With Contrast   REASON FOR EXAM:    SOB Cough  COMMENTS:       PROCEDURE: MCT - MCT CHEST WITH CONTRAST  - Sep 17 2014  9:58AM     CLINICAL DATA:  79 year old female with productive cough for the  past year worsening over the past 6 months with shortness of breath.  Prior smoker, quit in 2001. Post CABG and pacemaker placement.  Initial encounter.    EXAM:  CT CHEST WITH CONTRAST    TECHNIQUE:  Multidetector CT imaging of the chest was performed during  intravenous contrast administration.    CONTRAST:  75 cc Isovue 300.    COMPARISON:  None.    FINDINGS:  7.5 x 2.7 x 4 cm mass right hilar region with findings highly  suspicious for malignancy possibly small cell lung cancer invading  the right mainstem bronchus and narrowing right-sided pulmonary  arteries.Postobstructive right upper lobe bronchi consolidation and  peripheral lung parenchymal changes right upper lobe at less so  within the superior segment of the right lower lobe most likely  represent postobstructive inflammation rather than  primary  peripheral lung cancer.  With narrowing of the pulmonary arteries, patient is at risk for  development of pulmonary emboli. Current exam was not performed  utilized pulmonary embolus technique. A small pulmonary embolus  involving early branch of the right lower lobe pulmonary cannot be  excluded. The right upper lobe tubular filling defects have an  appearance more suggestive of secretion filled obstructive bronchi  rather pulmonary emboli.    Bilateral hilar matted lymph nodes. Several small mediastinal lymph  nodes. Spread of tumor to these normal size lymph nodes cannot be  excluded.    Esophagus with mild circumferential thickening may be related to  changes of esophagitis/dysmotility without focal lesion identified.  Post CABG with pacemaker in place with leads in the region of the  right atrium and right ventricle. Marked thinning lateral wall of  the left ventricle calcification consistent with prior infarct.  Coronary artery calcifications.    Atherosclerotic type changes thoracicaorta with ascending thoracic  aorta ectatic measuring up to 3.5 cm.    No osseous destructive lesion.    Visualized upper abdominal  structures without findings to suggest  metastatic disease.     IMPRESSION:  Primary lung malignancy right hilar region with invasion of right  mainstem bronchus and narrowing of vascular structures as detailed  above. This causes postobstructive inflammatory changes in right  upper lobe and superior segment of the right lower lobe.    Matted bilateral hilar lymph nodes. Increased number of normal-sized  mediastinal lymph nodes. Spread of tumor to these normal-sized lymph  nodes is a distinct possibility.    Narrowed right pulmonary artery. A tiny pulmonary embolus within  early branch of right lower lobe pulmonary artery cannot be  excluded.    Prior myocardial infarction with thinning of the lateral wall of the  left ventricle. Pacemaker is in  place.  Atherosclerotic type changes including coronary artery  calcifications and ectatic thoracic aorta.    These results will be called to the ordering clinician or  representative by the Radiologist Assistant, and communication  documented in the PACS or zVision Dashboard.      Electronically Signed    By: Chauncey Cruel M.D.    On: 09/17/2014 12:47         Verified By: Doug Sou, M.D.,  Nuclear Med:    25-Jan-16 12:12, PET/CT Scan Lung Cancer Diagnosis  PET/CT Scan Lung Cancer Diagnosis   REASON FOR EXAM:    Lung mass  COMMENTS:       PROCEDURE: PET - PET/CT DX LUNG CA  - Sep 23 2014 12:12PM     CLINICAL DATA:  Initial treatment strategy for lung mass.    EXAM:  NUCLEAR MEDICINE PET SKULL BASE TO THIGH    TECHNIQUE:  12.5 mCi F-18 FDGwas injected intravenously. Full-ring PET imaging  was performed from the skull base to thigh after the radiotracer. CT  data was obtained and used for attenuation correction and anatomic  localization.  FASTING BLOOD GLUCOSE:  Value: 99 mg/dl    COMPARISON:  Chest CT 09/17/2013.    FINDINGS:  NECK    No hypermetabolic lymph nodes in the neck.    CHEST    In the perihilar aspect of the right upper lobe there is a pleural  based 5.1 x 2.4 cm hypermetabolic (SUVmax = 11.9)JYNW, which appears  to occlude the right upper lobe bronchus. This is associated with  extensive postobstructive changes in the right upper lobe,  predominantly mucous plugging and postobstructive pneumonitis. This  is contiguous with right hilar structures such that underlying right  hilar adenopathy is not excluded. Several nonenlarged mediastinal  and left hilar lymph nodes are noted, which do not demonstrate  hypermetabolism (highly nonspecific). Small amount of  peribronchovascular ground-glass attenuation in the superior segment  of the right lower lobe likely represents some  infection/inflammation. No other suspicious appearing pulmonary  nodules  or masses are noted. Esophagus is unremarkable in  appearance. No pleural effusions. Heart is mildly enlarged left left  atrial dilatation. There is atherosclerosis of the thoracic aorta,  the great vessels of the mediastinum and the coronary arteries,  including calcified atherosclerotic plaque in the left anterior  descending, left circumflex and right coronary arteries. Status post  median sternotomy for mitral annuloplasty, and for CABG. Extensive  myocardial calcifications in the lateral, inferolateral and inferior  wall segments from the mid ventricle to the base, compatible with  old left circumflex coronary artery territory myocardial infarction  with associated dystrophic myocardial calcifications. Some  pericardial calcifications are also noted anterior to the right  ventricle. Left-sided  pacemaker in position with lead tips  terminating in the right atrium and right ventricular apex. Mild  diffuse bronchial thickening with mild centrilobular and paraseptal  emphysema.    ABDOMEN/PELVIS    No abnormal hypermetabolic activity within the liver, pancreas,  adrenal glands, or spleen. No hypermetabolic lymph nodes in the  abdomen or pelvis. Extensive atherosclerosis throughout the  abdominal and pelvic vasculature, without definite aneurysm.  Numerous colonic diverticulae are present, particularly in the  sigmoid colon and descending colon, without surrounding inflammatory  changes to suggest an acute diverticulitis at this time. Status post  hysterectomy.    SKELETON    No focal hypermetabolic activity to suggest skeletal metastasis.     IMPRESSION:  1. 5.1 x 2.4 cm hypermetabolic mass inthe perihilar aspect of the  right upper lobe, contiguous with adjacent hilar structures, with  probable underlying right hilar malignant adenopathy, causing  obstruction of the right upper lobe bronchus with mild  postobstructive changes in the right upper lobe. No other signs  of  metastatic disease elsewhere in the chest, abdomen or pelvis. Based  on these findings, this is favored to reflect T2b, N1, Mx lung  cancer (i.e., likely stage IIB).  2. Atherosclerosis, including 3 vessel coronary artery disease, with  evidence of old left circumflex coronary artery territory myocardial  infarction, as above. Status post median sternotomy for CABG and  mitral annuloplasty.  3. Colonic diverticulosis without evidence of acute diverticulitis  at this time.  4. Additional incidental findings, as above.      Electronically Signed    By: Vinnie Langton M.D.    On: 09/23/2014 13:17         Verified By: Etheleen Mayhew, M.D.,   Relevent Results:   Relevant Scans and Labs PET/CT scan and CT scan reviewed   Assessment and Plan: Impression:   stage IIIa squamous cell carcinoma the right lung hilum in 79 year old female with underlying COPD and multiple cardiac pathology.For concurrent chemoradiation with curative intent. Plan:   this time I have recommended radiation therapy along with concurrent chemotherapy. I will plan on delivering 6000 cGy over 6 weeks using IM RT treatment planning and delivery to spare critical structures such as adjacent esophagus and her heart based on her extensive cardiac history. I believe I'm RT would offer my best dose constraints to normal tissue such as the heart and esophagus. Risks and benefits of treatment including exacerbation of cough, fatigue, alteration of blood counts, possible dysphasia secondary to radiation esophagitis, and possible alteration of blood counts all were explained in detail to the patient and her daughters. They both seem to comprehend my treatment plan well. They do have a second opinion scheduled at [facility] for next week and I have tentatively set up CT simulation the following week.  I would like to take this opportunity for allowing me to participate in the care of your patient..  Fax to  Physician:  Physicians To Recieve Fax: Gayland Curry, MD - [phone] Vilinda Boehringer, MD - [phone].  Electronic Signatures: Armstead Peaks (MD)  (Signed 29-Jan-16 14:01)  Authored: HPI, Diagnosis, Past Hx, PFSH, Allergies, Home Meds, ROS, Nursing Notes, Physical Exam, Other Results, Relevent Results, Encounter Assessment and Plan, Fax to Physician   Last Updated: 29-Jan-16 14:01 by Armstead Peaks (MD)

## 2014-12-29 NOTE — Op Note (Signed)
PATIENT NAME:  Jenny Cooper, Jenny Cooper MR#:  [id number] DATE OF BIRTH:  1936/05/30  DATE OF PROCEDURE:  10/10/2014  PREOPERATIVE DIAGNOSIS:  Lung cancer with poor venous access.   POSTOPERATIVE DIAGNOSIS:  Lung cancer with poor venous access.   PROCEDURES:  1.  Ultrasound guidance for vascular access, right internal jugular vein.  2.  Fluoroscopic guidance for placement of catheter.  3.  Placement of CT compatible Port-A-Cath, right internal jugular vein.   SURGEON:  Algernon Huxley, MD   ANESTHESIA:  Local with moderate conscious sedation.   FLUOROSCOPY TIME:  Less than 1 minute.   CONTRAST:  Zero.   ESTIMATED BLOOD LOSS:  25 mL.  INDICATION FOR PROCEDURE:  A 79 year old female with lung cancer. She needs a Port-A-Cath for chemotherapy and dural venous access. Risks and benefits were discussed. Informed consent was obtained.   DESCRIPTION OF THE PROCEDURE:  The patient was brought to the vascular and interventional radiology suite. The right neck and chest were sterilely prepped and draped, and a sterile surgical field was created. Ultrasound was used to help visualize a patent right internal jugular vein. This was then accessed under direct ultrasound guidance without difficulty with a Seldinger needle and a permanent image was recorded. A J-wire was placed. After skin nick and dilatation, the peel-away sheath was then placed over the wire. I then anesthetized an area under the clavicle approximately 2 fingerbreadths. A transverse incision was created and an inferior pocket was created with electrocautery and blunt dissection. The port was then brought onto the field, placed into the pocket and secured to the chest wall with 2 Prolene sutures. The catheter was connected to the port and tunneled from the subclavicular incision to the access site. Fluoroscopic guidance was used to cut the catheter to an appropriate length. The catheter was then placed through the peel-away sheath and the peel-away  sheath was removed. The catheter tip was parked in excellent location in the cavoatrial junction.  The pocket was then irrigated with antibiotic-impregnated saline and the wound was closed with a running 3-0 Vicryl and a 4-0 Monocryl. The access incision was closed with a single 4-0 Monocryl. The Huber needle was used to withdraw blood and flush the port with heparinized saline. Dermabond was then placed as a dressing. The patient tolerated the procedure well and was taken to the recovery room in stable condition.    ____________________________ Algernon Huxley, MD jsd:mw D: 10/10/2014 12:20:22 ET T: 10/10/2014 16:08:12 ET JOB#: [id number]  cc: Algernon Huxley, MD, <Dictator> Algernon Huxley MD ELECTRONICALLY SIGNED 10/10/2014 16:43

## 2014-12-29 NOTE — H&P (Signed)
PATIENT NAME:  Jenny Cooper, POUNCEY MR#:  [id number] DATE OF BIRTH:  06-10-1936  DATE OF ADMISSION:  09/24/2014  PRIMARY CARE PHYSICIAN: Aquinnah Devin. Astrid Divine, MD  PULMONOLOGIST: Vilinda Boehringer, MD  CHIEF COMPLAINT: Hypoxia, shortness of breath.   HISTORY OF PRESENT ILLNESS: This is a 79 year old female who was brought into the hospital for elective bronchoscopy after being diagnosed a right upper lobe lung mass. Post bronchoscopy, the patient was noted to be quite hypoxic with oxygen saturation in the mid 70s and also up to the low 80s. The patient on ambulation became a little bit cyanotic and became short of breath, and hospitalist services were contacted for further treatment and evaluation.   The patient denies any chest pain. She does admit to shortness of breath, which has progressively gotten worse. She admits to a cough, but no hemoptysis. No nausea. No vomiting. No other associated symptoms.   Due to her worsening hypoxemia and her not being on oxygen at home, hospitalist services were contacted for treatment and evaluation.   REVIEW OF SYSTEMS:  CONSTITUTIONAL: No documented fever. No weight gain. No weight loss.  EYES: No blurred or double vision.  EARS, NOSE, AND THROAT: No tinnitus. No postnasal drip. No redness of the oropharynx.  RESPIRATORY: No cough. No wheeze. No hemoptysis. Positive dyspnea.  CARDIOVASCULAR: No chest pain, no orthopnea, no palpitations, no syncope.  GASTROINTESTINAL: No nausea, no vomiting, no diarrhea, no abdominal pain. No melena or hematochezia.  GENITOURINARY: No dysuria or hematuria.  ENDOCRINE: No polyuria or nocturia. No heat or cold intolerance.  HEMATOLOGIC: No anemia, no bruising, no bleeding.  INTEGUMENTARY: No rashes. No lesions.  MUSCULOSKELETAL: No arthritis. No swelling. No gout.  NEUROLOGIC: No numbness or tingling. No ataxia. No seizure activity.  NEUROPSYCHIATRIC: No anxiety. No insomnia. No ADD.   PAST MEDICAL HISTORY: Consistent with a  right upper lobe lung mass, history of chronic AFib/AFlutter, history of coronary artery disease status post bypass, history of pacemaker, COPD, hypertension, hyperlipidemia, GERD.   ALLERGIES: DEMEROL AND LOVENOX.   SOCIAL HISTORY: Used to be a smoker; quit about 10 years ago. Does have a 40 pack-year smoking history. Also drinks 2 glasses of wine daily. No illicit drug abuse. Lives by herself.   FAMILY HISTORY: The patient's mother and father are both deceased. She cannot recall what her father died from. Mother died from complications of a stroke.   CURRENT MEDICATIONS: Aspirin 81 mg daily, atenolol 25 mg daily, atorvastatin 20 mg daily, azelastine 2 sprays to each nostril daily, calcium carbonate 2 times daily with a meal 600 mg, Eliquis 5 mg daily, hydrochlorothiazide 12.5 mg daily, Synthroid 75 mcg daily, losartan 25 mg daily, multivitamin daily, Norco 5/325 1 tablet q. 6 hours as needed, omeprazole 20 mg daily.   PHYSICAL EXAMINATION: Presently is as follows:  VITAL SIGNS: She is afebrile. Pulse in the 70s, respirations 18, blood pressure 97/76. Saturations are 97% on 3 L nasal cannula.  GENERAL: She is a pleasant -appearing female in no apparent distress.  HEAD, EYES, EARS, NOSE AND THROAT: Atraumatic, normocephalic. Extraocular muscles are intact. Pupils react to light. Sclerae anicteric. No conjunctival injection. No pharyngeal erythema.  NECK: Supple. There is no jugular venous distention. No bruits. No lymphadenopathy or thyromegaly.  HEART: Regular rate and rhythm. No murmurs, no rubs, no clicks.  LUNGS: Clear to auscultation bilaterally. No rales or rhonchi. No wheezes. Prolonged inspiratory and expiratory phase, negative use of accessory muscles. No dullness to percussion.  ABDOMEN: Soft, flat, nontender, nondistended.  Has good bowel sounds. No hepatosplenomegaly appreciated.  EXTREMITIES: No evidence of any cyanosis, clubbing, or peripheral edema. Has +2 pedal and radial pulses  bilaterally.  NEUROLOGICAL: The patient is alert, awake and oriented x 3 with no focal motor or sensory deficits appreciated bilaterally.  SKIN: Moist and warm, with no rashes appreciated.  LYMPHATIC: There is no cervical or axillary lymphadenopathy.   LABORATORY DATA: There are no significant laboratory exams presently.   ASSESSMENT AND PLAN: This is a 79 year old female with history of chronic obstructive pulmonary disease, hypertension, history of coronary artery disease status post bypass, hyperlipidemia, gastroesophageal reflux disease, history of atrial fibrillation, hypothyroidism, recently diagnosed with a right lung mass, came into the hospital or bronchoscopy and biopsy and postprocedure noted to be hypoxic.  1.  Acute hypoxia. The exact etiology of this is unclear; probably related to underlying mild atelectasis versus chronic obstructive pulmonary disease. The patient apparently is not on oxygen at home. I will get a chest x-ray presently, and also repeat one in the morning, start her on some DuoNebs and Combivent, get her an incentive spirometer. We will consult pulmonary. The patient is known to Dr. Stevenson Clinch.  2.  Hypertension, presently hemodynamically stable. Continue atenolol, losartan, hydrochlorothiazide.  3.  Hyperlipidemia. Continue atorvastatin.  4.  Hypothyroidism. Continue Synthroid.  5.  Gastroesophageal reflux disease. Continue Protonix.  6.  History of chronic atrial fibrillation. The patient is on Eliquis. We can resume that in the morning and she had the bronchoscopy done today.   CODE STATUS: The patient is a Full Code.   TIME SPENT: 50 minutes.   ____________________________ Belia Heman. Verdell Carmine, MD vjs:MT D: 09/24/2014 16:14:11 ET T: 09/24/2014 16:33:48 ET JOB#: [id number]  cc: Belia Heman. Verdell Carmine, MD, <Dictator> Henreitta Leber MD ELECTRONICALLY SIGNED 10/12/2014 12:47

## 2015-01-03 ENCOUNTER — Other Ambulatory Visit: Payer: Self-pay | Admitting: *Deleted

## 2015-01-03 DIAGNOSIS — C3490 Malignant neoplasm of unspecified part of unspecified bronchus or lung: Secondary | ICD-10-CM

## 2015-01-03 DIAGNOSIS — C3491 Malignant neoplasm of unspecified part of right bronchus or lung: Secondary | ICD-10-CM

## 2015-01-03 HISTORY — DX: Malignant neoplasm of unspecified part of right bronchus or lung: C34.91

## 2015-01-06 ENCOUNTER — Ambulatory Visit
Admission: RE | Admit: 2015-01-06 | Discharge: 2015-01-06 | Disposition: A | Discharge status: Home / Self Care | Payer: Medicare Other | Source: Ambulatory Visit | Attending: Oncology | Admitting: Oncology

## 2015-01-06 DIAGNOSIS — C3411 Malignant neoplasm of upper lobe, right bronchus or lung: Secondary | ICD-10-CM | POA: Diagnosis present

## 2015-01-06 DIAGNOSIS — C3490 Malignant neoplasm of unspecified part of unspecified bronchus or lung: Secondary | ICD-10-CM

## 2015-01-06 DIAGNOSIS — I2510 Atherosclerotic heart disease of native coronary artery without angina pectoris: Secondary | ICD-10-CM | POA: Insufficient documentation

## 2015-01-06 DIAGNOSIS — I10 Essential (primary) hypertension: Secondary | ICD-10-CM | POA: Diagnosis not present

## 2015-01-06 DIAGNOSIS — J449 Chronic obstructive pulmonary disease, unspecified: Secondary | ICD-10-CM | POA: Insufficient documentation

## 2015-01-06 DIAGNOSIS — Z79899 Other long term (current) drug therapy: Secondary | ICD-10-CM | POA: Diagnosis not present

## 2015-01-06 LAB — GLUCOSE, CAPILLARY: GLUCOSE-CAPILLARY: 114 mg/dL — AB (ref 70–99)

## 2015-01-06 MED ORDER — FLUDEOXYGLUCOSE F - 18 (FDG) INJECTION
12.3800 | Freq: Once | INTRAVENOUS | Status: AC | PRN
  Administered 2015-01-06: 12.38 via INTRAVENOUS

## 2015-01-08 ENCOUNTER — Encounter: Payer: Self-pay | Admitting: Oncology

## 2015-01-08 ENCOUNTER — Inpatient Hospital Stay: Payer: Medicare Other | Attending: Oncology

## 2015-01-08 ENCOUNTER — Inpatient Hospital Stay (HOSPITAL_BASED_OUTPATIENT_CLINIC_OR_DEPARTMENT_OTHER): Payer: Medicare Other | Admitting: Oncology

## 2015-01-08 VITALS — BP 128/66 | HR 64 | Temp 98.2°F | Resp 20 | Ht 67.0 in | Wt 156.0 lb

## 2015-01-08 DIAGNOSIS — Z9221 Personal history of antineoplastic chemotherapy: Secondary | ICD-10-CM | POA: Insufficient documentation

## 2015-01-08 DIAGNOSIS — Z79899 Other long term (current) drug therapy: Secondary | ICD-10-CM | POA: Insufficient documentation

## 2015-01-08 DIAGNOSIS — C3401 Malignant neoplasm of right main bronchus: Secondary | ICD-10-CM

## 2015-01-08 DIAGNOSIS — Z87891 Personal history of nicotine dependence: Secondary | ICD-10-CM

## 2015-01-08 DIAGNOSIS — Z923 Personal history of irradiation: Secondary | ICD-10-CM

## 2015-01-08 DIAGNOSIS — C3411 Malignant neoplasm of upper lobe, right bronchus or lung: Secondary | ICD-10-CM

## 2015-01-08 DIAGNOSIS — I2510 Atherosclerotic heart disease of native coronary artery without angina pectoris: Secondary | ICD-10-CM | POA: Diagnosis not present

## 2015-01-08 DIAGNOSIS — I10 Essential (primary) hypertension: Secondary | ICD-10-CM

## 2015-01-08 DIAGNOSIS — C3490 Malignant neoplasm of unspecified part of unspecified bronchus or lung: Secondary | ICD-10-CM

## 2015-01-08 DIAGNOSIS — G629 Polyneuropathy, unspecified: Secondary | ICD-10-CM

## 2015-01-08 DIAGNOSIS — Z7982 Long term (current) use of aspirin: Secondary | ICD-10-CM | POA: Insufficient documentation

## 2015-01-08 DIAGNOSIS — Z950 Presence of cardiac pacemaker: Secondary | ICD-10-CM | POA: Insufficient documentation

## 2015-01-08 DIAGNOSIS — E785 Hyperlipidemia, unspecified: Secondary | ICD-10-CM | POA: Insufficient documentation

## 2015-01-08 LAB — COMPREHENSIVE METABOLIC PANEL
ALBUMIN: 3.9 g/dL (ref 3.5–5.0)
ALK PHOS: 66 U/L (ref 38–126)
ALT: 15 U/L (ref 14–54)
ANION GAP: 4 — AB (ref 5–15)
AST: 21 U/L (ref 15–41)
BUN: 13 mg/dL (ref 6–20)
CALCIUM: 8.9 mg/dL (ref 8.9–10.3)
CO2: 31 mmol/L (ref 22–32)
CREATININE: 0.8 mg/dL (ref 0.44–1.00)
Chloride: 101 mmol/L (ref 101–111)
GFR calc non Af Amer: >60 mL/min (ref >60)
Glucose, Bld: 96 mg/dL (ref 65–99)
Potassium: 3.5 mmol/L (ref 3.5–5.1)
Sodium: 136 mmol/L (ref 135–145)
Total Bilirubin: 0.3 mg/dL (ref 0.3–1.2)
Total Protein: 7.3 g/dL (ref 6.5–8.1)

## 2015-01-08 LAB — CBC WITH DIFFERENTIAL/PLATELET
BASOS ABS: 0 10*3/uL (ref 0–0.1)
Basophils Relative: 1 %
Eosinophils Absolute: 0.4 10*3/uL (ref 0–0.7)
Eosinophils Relative: 10 %
HCT: 37.8 % (ref 35.0–47.0)
HEMOGLOBIN: 12.3 g/dL (ref 12.0–16.0)
LYMPHS PCT: 26 %
Lymphs Abs: 1 10*3/uL (ref 1.0–3.6)
MCH: 30.5 pg (ref 26.0–34.0)
MCHC: 32.6 g/dL (ref 32.0–36.0)
MCV: 93.6 fL (ref 80.0–100.0)
MONO ABS: 0.6 10*3/uL (ref 0.2–0.9)
Monocytes Relative: 17 %
NEUTROS ABS: 1.7 10*3/uL (ref 1.4–6.5)
NEUTROS PCT: 46 %
PLATELETS: 196 10*3/uL (ref 150–440)
RBC: 4.04 MIL/uL (ref 3.80–5.20)
RDW: 19.5 % — ABNORMAL HIGH (ref 11.5–14.5)
WBC: 3.6 10*3/uL (ref 3.6–11.0)

## 2015-01-11 ENCOUNTER — Encounter: Payer: Self-pay | Admitting: Oncology

## 2015-01-11 DIAGNOSIS — E039 Hypothyroidism, unspecified: Secondary | ICD-10-CM | POA: Insufficient documentation

## 2015-01-11 DIAGNOSIS — I2510 Atherosclerotic heart disease of native coronary artery without angina pectoris: Secondary | ICD-10-CM | POA: Insufficient documentation

## 2015-01-11 NOTE — Progress Notes (Signed)
[place] @ [facility] Telephone:([phone]  Fax:(336) [phone]     Casia Corti OB: [birth year]  MR#: [id number]  ACZ#:[id number]  Patient Care Team: Gayland Curry, MD as PCP - General (Family Medicine)  CHIEF COMPLAINT:  Chief Complaint  Patient presents with  . Follow-up    lung cancer     [place] @ [facility] Telephone:(336) [phone]  Fax:(336) [phone]     Eleftheria Taborn OB: 1936/01/19  MR#: [id number]  SEG#:[id number]  Patient Care Team: Gayland Curry, MD as PCP - General (Family Medicine)  CHIEF COMPLAINT:  Chief Complaint  Patient presents with  . 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   Patient had compression of the right mainstem bronchus because of enlarged lymph node and a mass. Stage is T4 N1 M0 tumor stage IIIa diagnosis in January of 2016 2.started on radiation and chemotherapy from October 21, 2014 3.finished 6 cycles of carboplatinum and Taxol  in March  29 th of 2016 HPI:        Carcinoma of right lung   01/03/2015 Initial Diagnosis Carcinoma of right lung    No flowsheet data found.  INTERVAL HISTORY: 79 year old lady was finished chemoradiation therapy came today further follow-up regarding carcinoma of lung.  Patient has repeat PET scan which shows significant improvement.  Patient did not have any significant side effect of chemotherapy here to discuss further treatment planning.  Cough is improved.  REVIEW OF SYSTEMS:   Gen. status: Patient is a alert.  Not in any acute distress.  Lungs: Dry hacking cough is improved.  No hemoptysis or chest pain HEENT: No soreness in the mouth.  No difficulty swallowing.  Cardiac: Patient had number of cardiac issues including atrial flutter coronary artery disease and pacemaker.  GU: No dysuria or  hematuria.  Skin: No rash.  Neurological system no tingling numbness in upper extremity.  Lower extremity neuropathy has not changed.  Lower extremity no edema musculoskeletal system no bony pain.  Skin no rash or ecchymosis.  As per HPI. Otherwise, a complete review of systems is negatve.  PAST MEDICAL HISTORY: Past Medical History  Diagnosis Date  . HTN (hypertension)   . Pacemaker   . Coronary artery disease   . HLD (hyperlipidemia)   . CAD (coronary artery disease) unknown  . Lung cancer   . History of colonoscopy 2013  . History of mammography, screening 2015  . History of Papanicolaou smear of cervix 2013  . Carcinoma of right lung 01/03/2015    PAST SURGICAL HISTORY: Past Surgical History  Procedure Laterality Date  . Appendectomy    . Vaginal hysterectomy    . Cardiac bypass    . Pacemaker insertion N/A     FAMILY HISTORY Family History  Problem Relation Age of Onset  . COPD Mother     sister, and brother    GYNECOLOGIC HISTORY:  No LMP recorded. Patient is postmenopausal.     ADVANCED DIRECTIVES: Patient does have advanced directive   HEALTH MAINTENANCE: History  Substance Use Topics  . Smoking status: Former Smoker -- 2.00 packs/day for 42 years    Types: Cigarettes    Quit date: 08/30/2000  . Smokeless tobacco: Not on file     Comment: quit smoking in 08/28/2000  . Alcohol Use: 6.0 oz/week  10 Glasses of wine per week     Comment: 2 glasses of wine per day     Allergies  Allergen Reactions  . Lovenox [Enoxaparin Sodium] Itching  . Meperidine Other (See Comments)    Other Reaction: pt does not like how it makes her feel  . Enoxaparin Rash    Other Reaction: OTHER REACTION    Current Outpatient Prescriptions  Medication Sig Dispense Refill  . aspirin 81 MG tablet Take 81 mg by mouth daily.    Marland Kitchen atenolol (TENORMIN) 25 MG tablet Take 25 mg by mouth daily.    Marland Kitchen atorvastatin (LIPITOR) 20 MG tablet Take 1 tablet by mouth daily.    Marland Kitchen azelastine  (ASTELIN) 0.1 % nasal spray Place 2 sprays into both nostrils 2 (two) times daily. Use in each nostril as directed    . calcium carbonate (OS-CAL) 600 MG TABS tablet Take 600 mg by mouth 2 (two) times daily with a meal.    . ELIQUIS 5 MG TABS tablet Take 1 tablet by mouth 2 (two) times daily.   11  . hydrochlorothiazide (MICROZIDE) 12.5 MG capsule Take 12.5 mg by mouth daily.    Marland Kitchen levothyroxine (SYNTHROID, LEVOTHROID) 75 MCG tablet Take 75 mcg by mouth daily before breakfast.    . losartan (COZAAR) 25 MG tablet Take 25 mg by mouth daily.    . Multiple Vitamin (MULTI-VITAMINS) TABS Take 1 tablet by mouth daily.    . [employer] ALLERGY RELIEF 180 MG tablet Take 180 mg by mouth daily.  5  . HYDROcodone-acetaminophen (NORCO/VICODIN) 5-325 MG per tablet Take 1 tablet by mouth every 6 (six) hours as needed (for cough). (Patient not taking: Reported on 01/08/2015) 30 tablet 0  . ipratropium-albuterol (DUONEB) 0.5-2.5 (3) MG/3ML SOLN Inhale 3 mLs into the lungs 2 (two) times daily.  3  . losartan (COZAAR) 25 MG tablet Take 1 tablet by mouth daily.    . Multiple Vitamin (MULTIVITAMIN) tablet Take 1 tablet by mouth daily.    Marland Kitchen omeprazole (PRILOSEC) 20 MG capsule Take 20 mg by mouth daily.    Marland Kitchen PRILOSEC OTC 20 MG tablet Take 2 tablets by mouth daily before supper. 30 minutes before supper  12   No current facility-administered medications for this visit.    OBJECTIVE:  Filed Vitals:   01/08/15 1145  BP: 128/66  Pulse: 64  Temp: 98.2 F (36.8 C)  Resp: 20     Body mass index is 24.43 kg/(m^2).    ECOG FS:1 - Symptomatic but completely ambulatory  PHYSICAL EXAM: GENERAL:  Well developed, well nourished, sitting comfortably in the exam room in no acute distress. MENTAL STATUS:  Alert and oriented to person, place and time. HEAD:   Normocephalic, atraumatic, face symmetric, no Cushingoid features. EYES: eyes.  Pupils equal round and reactive to light and accomodation.  No conjunctivitis or scleral  icterus. ENT:  Oropharynx clear without lesion.  Tongue normal. Mucous membranes moist.  RESPIRATORY:  Clear to auscultation without rales, wheezes or rhonchi. CARDIOVASCULAR:  Regular rate and rhythm without murmur, rub or gallop. . ABDOMEN:  Soft, non-tender, with active bowel sounds, and no hepatosplenomegaly.  No masses. BACK:  No CVA tenderness.  No tenderness on percussion of the back or rib cage. SKIN:  No rashes, ulcers or lesions. EXTREMITIES: No edema, no skin discoloration or tenderness.  No palpable cords. LYMPH NODES: No palpable cervical, supraclavicular, axillary or inguinal adenopathy  NEUROLOGICAL: Unremarkable. PSYCH:  Appropriate.   LAB RESULTS:  Appointment on  01/08/2015  Component Date Value Ref Range Status  . WBC 01/08/2015 3.6  3.6 - 11.0 K/uL Final  . RBC 01/08/2015 4.04  3.80 - 5.20 MIL/uL Final  . Hemoglobin 01/08/2015 12.3  12.0 - 16.0 g/dL Final  . HCT 01/08/2015 37.8  35.0 - 47.0 % Final  . MCV 01/08/2015 93.6  80.0 - 100.0 fL Final  . MCH 01/08/2015 30.5  26.0 - 34.0 pg Final  . MCHC 01/08/2015 32.6  32.0 - 36.0 g/dL Final  . RDW 01/08/2015 19.5* 11.5 - 14.5 % Final  . Platelets 01/08/2015 196  150 - 440 K/uL Final  . Neutrophils Relative % 01/08/2015 46   Final  . Neutro Abs 01/08/2015 1.7  1.4 - 6.5 K/uL Final  . Lymphocytes Relative 01/08/2015 26   Final  . Lymphs Abs 01/08/2015 1.0  1.0 - 3.6 K/uL Final  . Monocytes Relative 01/08/2015 17   Final  . Monocytes Absolute 01/08/2015 0.6  0.2 - 0.9 K/uL Final  . Eosinophils Relative 01/08/2015 10   Final  . Eosinophils Absolute 01/08/2015 0.4  0 - 0.7 K/uL Final  . Basophils Relative 01/08/2015 1   Final  . Basophils Absolute 01/08/2015 0.0  0 - 0.1 K/uL Final  . Sodium 01/08/2015 136  135 - 145 mmol/L Final  . Potassium 01/08/2015 3.5  3.5 - 5.1 mmol/L Final  . Chloride 01/08/2015 101  101 - 111 mmol/L Final  . CO2 01/08/2015 31  22 - 32 mmol/L Final  . Glucose, Bld 01/08/2015 96  65 - 99 mg/dL  Final  . BUN 01/08/2015 13  6 - 20 mg/dL Final  . Creatinine, Ser 01/08/2015 0.80  0.44 - 1.00 mg/dL Final  . Calcium 01/08/2015 8.9  8.9 - 10.3 mg/dL Final  . Total Protein 01/08/2015 7.3  6.5 - 8.1 g/dL Final  . Albumin 01/08/2015 3.9  3.5 - 5.0 g/dL Final  . AST 01/08/2015 21  15 - 41 U/L Final  . ALT 01/08/2015 15  14 - 54 U/L Final  . Alkaline Phosphatase 01/08/2015 66  38 - 126 U/L Final  . Total Bilirubin 01/08/2015 0.3  0.3 - 1.2 mg/dL Final  . GFR calc non Af Amer 01/08/2015 >60  >60 mL/min Final  . GFR calc Af Amer 01/08/2015 >60  >60 mL/min Final   Comment: (NOTE) The eGFR has been calculated using the CKD EPI equation. This calculation has not been validated in all clinical situations. eGFR's persistently <60 mL/min signify possible Chronic Kidney Disease.   Georgiann Hahn gap 01/08/2015 4* 5 - 15 Final  Hospital Outpatient Visit on 01/06/2015  Component Date Value Ref Range Status  . Glucose-Capillary 01/06/2015 114* 70 - 99 mg/dL Final    No results found for: LABCA2 No results found for: CA199 No results found for: CEA No results found for: PSA No results found for: CA125   STUDIES: Nm Pet Image Restag (ps) Skull Base To Thigh  01/06/2015   CLINICAL DATA:  Subsequent treatment strategy for restaging of lung cancer. Right-sided lung biopsy 3/16. Chemotherapy 3/16. Radiation therapy 4/16.  EXAM: NUCLEAR MEDICINE PET SKULL BASE TO THIGH  TECHNIQUE: 12.4 MCi F-18 FDG was injected intravenously. Full-ring PET imaging was performed from the skull base to thigh after the radiotracer. CT data was obtained and used for attenuation correction and anatomic localization.  FASTING BLOOD GLUCOSE:  Value: 114 mg/dl  COMPARISON:  Chest CT of 09/25/2014.  Prior PET of 09/23/2014.  FINDINGS: NECK  No areas of abnormal  hypermetabolism.  CHEST  Marked improvement in appearance of the right perihilar upper lobe and right hilum. At the site of the previously described 5.1 x 2.4 cm mass, there is  hypermetabolism and soft tissue thickening about the medial aspect of the recannulized right upper lobe bronchus and superomedial right hilum. This measures a S.U.V. max of 7.1, including on image 90 of series 3. No well-defined residual measurable mass identified.  No new hypermetabolism within the mediastinum or left hilum.  ABDOMEN/PELVIS  No areas of abnormal hypermetabolism.  SKELETON  No suspicious osseous hypermetabolism. Hypermetabolism about the right rotator cuff is likely degenerative.  CT IMAGES PERFORMED FOR ATTENUATION CORRECTION  No cervical adenopathy.  Left carotid atherosclerosis.  Prior median sternotomy. Pacer. Mild cardiomegaly. Resolved right upper lobe postobstructive pneumonitis.  Normal adrenal glands.  Right greater than left hip osteoarthritis.  IMPRESSION: 1. Marked response to therapy. Mild residual hypermetabolism and soft tissue thickening about the central right upper lobe and right hilum. Although this could be treatment related, residual low volume disease cannot be excluded. 2. No evidence of distant metastasis.   Electronically Signed   By: Abigail Miyamoto M.D.   On: 01/06/2015 12:11       Patient expressed understanding and was in agreement with this plan. She also understands that She can call clinic at any time with any questions, concerns, or complaints.    Carcinoma of right lung   Staging form: Lung, AJCC 7th Edition     Clinical: Stage IIIA (T4, N1, M0) - Unsigned   Forest Gleason, MD   01/11/2015 11:25 AM      No flowsheet data found.  INTERVAL HISTORY: 79 year old lady with a history of right upper lobe squamous cell carcinoma stage IIIa disease status post chemoradiation therapy here for further follow-up revealed CT scan and further planning of treatment.  Dry hacking cough is improved.  No tingling numbness in upper extremity.  Lower extremity tingling numbness is unchanged.  No hemoptysis.  Appetite is gradually improving.  REVIEW OF SYSTEMS:   Gen.  status: Patient is feeling stronger.  HEENT:   As p no soreness in the mouth.  No difficulty in swallowing.  Cardiac: History of atrial flutter pacemaker no significant shortness of breath lungs: History of COPD.  Patient has quit smoking.  Shortness of breath on exertion.  No hemoptysis.  GI: No nausea no vomiting no rectal bleeding.  GU: No dysuria or hematuria.  Musculoskeletal system no bony pain.  [place]: No rash or ecchymosis.  Lower extremity no swelling.er HPI. Otherwise, a complete review of systems is negatve.  PAST MEDICAL HISTORY: Past Medical History  Diagnosis Date  . HTN (hypertension)   . Pacemaker   . Coronary artery disease   . HLD (hyperlipidemia)   . CAD (coronary artery disease) unknown  . Lung cancer   . History of colonoscopy 2013  . History of mammography, screening 2015  . History of Papanicolaou smear of cervix 2013  . Carcinoma of right lung 01/03/2015    PAST SURGICAL HISTORY: Past Surgical History  Procedure Laterality Date  . Appendectomy    . Vaginal hysterectomy    . Cardiac bypass    . Pacemaker insertion N/A     FAMILY HISTORY Family History  Problem Relation Age of Onset  . COPD Mother     sister, and brother    GYNECOLOGIC HISTORY:  No LMP recorded. Patient is postmenopausal.     ADVANCED DIRECTIVES:    HEALTH MAINTENANCE: History  Substance Use Topics  . Smoking status: Former Smoker -- 2.00 packs/day for 42 years    Types: Cigarettes    Quit date: 08/30/2000  . Smokeless tobacco: Not on file     Comment: quit smoking in 08/28/2000  . Alcohol Use: 6.0 oz/week    10 Glasses of wine per week     Comment: 2 glasses of wine per day     Colonoscopy:  PAP:  Bone density:  Lipid panel:  Allergies  Allergen Reactions  . Lovenox [Enoxaparin Sodium] Itching  . Meperidine Other (See Comments)    Other Reaction: pt does not like how it makes her feel  . Enoxaparin Rash    Other Reaction: OTHER REACTION    Current Outpatient  Prescriptions  Medication Sig Dispense Refill  . aspirin 81 MG tablet Take 81 mg by mouth daily.    Marland Kitchen atenolol (TENORMIN) 25 MG tablet Take 25 mg by mouth daily.    Marland Kitchen atorvastatin (LIPITOR) 20 MG tablet Take 1 tablet by mouth daily.    Marland Kitchen azelastine (ASTELIN) 0.1 % nasal spray Place 2 sprays into both nostrils 2 (two) times daily. Use in each nostril as directed    . calcium carbonate (OS-CAL) 600 MG TABS tablet Take 600 mg by mouth 2 (two) times daily with a meal.    . ELIQUIS 5 MG TABS tablet Take 1 tablet by mouth 2 (two) times daily.   11  . hydrochlorothiazide (MICROZIDE) 12.5 MG capsule Take 12.5 mg by mouth daily.    Marland Kitchen levothyroxine (SYNTHROID, LEVOTHROID) 75 MCG tablet Take 75 mcg by mouth daily before breakfast.    . losartan (COZAAR) 25 MG tablet Take 25 mg by mouth daily.    . Multiple Vitamin (MULTI-VITAMINS) TABS Take 1 tablet by mouth daily.    . [employer] ALLERGY RELIEF 180 MG tablet Take 180 mg by mouth daily.  5  . HYDROcodone-acetaminophen (NORCO/VICODIN) 5-325 MG per tablet Take 1 tablet by mouth every 6 (six) hours as needed (for cough). (Patient not taking: Reported on 01/08/2015) 30 tablet 0  . ipratropium-albuterol (DUONEB) 0.5-2.5 (3) MG/3ML SOLN Inhale 3 mLs into the lungs 2 (two) times daily.  3  . losartan (COZAAR) 25 MG tablet Take 1 tablet by mouth daily.    . Multiple Vitamin (MULTIVITAMIN) tablet Take 1 tablet by mouth daily.    Marland Kitchen omeprazole (PRILOSEC) 20 MG capsule Take 20 mg by mouth daily.    Marland Kitchen PRILOSEC OTC 20 MG tablet Take 2 tablets by mouth daily before supper. 30 minutes before supper  12   No current facility-administered medications for this visit.    OBJECTIVE:  Filed Vitals:   01/08/15 1145  BP: 128/66  Pulse: 64  Temp: 98.2 F (36.8 C)  Resp: 20     Body mass index is 24.43 kg/(m^2).    ECOG FS:1 - Symptomatic but completely ambulatory  PHYSICAL EXAM: GENERAL:  Well developed, well nourished, sitting comfortably in the exam room in no acute  distress. MENTAL STATUS:  Alert and oriented to person, place and time. HEAD:  Normocephalic, atraumatic, face symmetric, no Cushingoid features. EYES: .  Pupils equal round and reactive to light and accomodation.  No conjunctivitis or scleral icterus. ENT:  Oropharynx clear without lesion.  Tongue normal. Mucous membranes moist.  RESPIRATORY:  Clear to auscultation without rales, wheezes or rhonchi. CARDIOVASCULAR:  Regular rate and rhythm without murmur, rub or gallop. BREAST:  Right breast without masses, skin changes or nipple discharge.  Left  breast without masses, e. ABDOMEN:  Soft, non-tender, with active bowel sounds, and no hepatosplenomegaly.  No masses. BACK:  No CVA tenderness.  No tenderness on percussion of the back or rib cage. SKIN:  No rashes, ulcers or lesions. EXTREMITIES: No edema, no skin discoloration or tenderness.  No palpable cords. LYMPH NODES: No palpable cervical, supraclavicular, axillary or inguinal adenopathy  NEUROLOGICAL: Unremarkable. PSYCH:  Appropriate.   LAB RESULTS:  Appointment on 01/08/2015  Component Date Value Ref Range Status  . WBC 01/08/2015 3.6  3.6 - 11.0 K/uL Final  . RBC 01/08/2015 4.04  3.80 - 5.20 MIL/uL Final  . Hemoglobin 01/08/2015 12.3  12.0 - 16.0 g/dL Final  . HCT 01/08/2015 37.8  35.0 - 47.0 % Final  . MCV 01/08/2015 93.6  80.0 - 100.0 fL Final  . MCH 01/08/2015 30.5  26.0 - 34.0 pg Final  . MCHC 01/08/2015 32.6  32.0 - 36.0 g/dL Final  . RDW 01/08/2015 19.5* 11.5 - 14.5 % Final  . Platelets 01/08/2015 196  150 - 440 K/uL Final  . Neutrophils Relative % 01/08/2015 46   Final  . Neutro Abs 01/08/2015 1.7  1.4 - 6.5 K/uL Final  . Lymphocytes Relative 01/08/2015 26   Final  . Lymphs Abs 01/08/2015 1.0  1.0 - 3.6 K/uL Final  . Monocytes Relative 01/08/2015 17   Final  . Monocytes Absolute 01/08/2015 0.6  0.2 - 0.9 K/uL Final  . Eosinophils Relative 01/08/2015 10   Final  . Eosinophils Absolute 01/08/2015 0.4  0 - 0.7 K/uL  Final  . Basophils Relative 01/08/2015 1   Final  . Basophils Absolute 01/08/2015 0.0  0 - 0.1 K/uL Final  . Sodium 01/08/2015 136  135 - 145 mmol/L Final  . Potassium 01/08/2015 3.5  3.5 - 5.1 mmol/L Final  . Chloride 01/08/2015 101  101 - 111 mmol/L Final  . CO2 01/08/2015 31  22 - 32 mmol/L Final  . Glucose, Bld 01/08/2015 96  65 - 99 mg/dL Final  . BUN 01/08/2015 13  6 - 20 mg/dL Final  . Creatinine, Ser 01/08/2015 0.80  0.44 - 1.00 mg/dL Final  . Calcium 01/08/2015 8.9  8.9 - 10.3 mg/dL Final  . Total Protein 01/08/2015 7.3  6.5 - 8.1 g/dL Final  . Albumin 01/08/2015 3.9  3.5 - 5.0 g/dL Final  . AST 01/08/2015 21  15 - 41 U/L Final  . ALT 01/08/2015 15  14 - 54 U/L Final  . Alkaline Phosphatase 01/08/2015 66  38 - 126 U/L Final  . Total Bilirubin 01/08/2015 0.3  0.3 - 1.2 mg/dL Final  . GFR calc non Af Amer 01/08/2015 >60  >60 mL/min Final  . GFR calc Af Amer 01/08/2015 >60  >60 mL/min Final   Comment: (NOTE) The eGFR has been calculated using the CKD EPI equation. This calculation has not been validated in all clinical situations. eGFR's persistently <60 mL/min signify possible Chronic Kidney Disease.   Georgiann Hahn gap 01/08/2015 4* 5 - 15 Final  Hospital Outpatient Visit on 01/06/2015  Component Date Value Ref Range Status  . Glucose-Capillary 01/06/2015 114* 70 - 99 mg/dL Final    No results found for: LABCA2 No results found for: CA199 No results found for: CEA No results found for: PSA No results found for: CA125   STUDIES: Nm Pet Image Restag (ps) Skull Base To Thigh  01/06/2015   CLINICAL DATA:  Subsequent treatment strategy for restaging of lung cancer. Right-sided lung biopsy 3/16. Chemotherapy  3/16. Radiation therapy 4/16.  EXAM: NUCLEAR MEDICINE PET SKULL BASE TO THIGH  TECHNIQUE: 12.4 MCi F-18 FDG was injected intravenously. Full-ring PET imaging was performed from the skull base to thigh after the radiotracer. CT data was obtained and used for attenuation  correction and anatomic localization.  FASTING BLOOD GLUCOSE:  Value: 114 mg/dl  COMPARISON:  Chest CT of 09/25/2014.  Prior PET of 09/23/2014.  FINDINGS: NECK  No areas of abnormal hypermetabolism.  CHEST  Marked improvement in appearance of the right perihilar upper lobe and right hilum. At the site of the previously described 5.1 x 2.4 cm mass, there is hypermetabolism and soft tissue thickening about the medial aspect of the recannulized right upper lobe bronchus and superomedial right hilum. This measures a S.U.V. max of 7.1, including on image 90 of series 3. No well-defined residual measurable mass identified.  No new hypermetabolism within the mediastinum or left hilum.  ABDOMEN/PELVIS  No areas of abnormal hypermetabolism.  SKELETON  No suspicious osseous hypermetabolism. Hypermetabolism about the right rotator cuff is likely degenerative.  CT IMAGES PERFORMED FOR ATTENUATION CORRECTION  No cervical adenopathy.  Left carotid atherosclerosis.  Prior median sternotomy. Pacer. Mild cardiomegaly. Resolved right upper lobe postobstructive pneumonitis.  Normal adrenal glands.  Right greater than left hip osteoarthritis.  IMPRESSION: 1. Marked response to therapy. Mild residual hypermetabolism and soft tissue thickening about the central right upper lobe and right hilum. Although this could be treatment related, residual low volume disease cannot be excluded. 2. No evidence of distant metastasis.   Electronically Signed   By: Abigail Miyamoto M.D.   On: 01/06/2015 12:11    ASSESSSquamous cell carcinoma of right upper lobe review of follow-up PET scan so significant response to the treatment. MEDICAL DECISION MAKINGPET SCAN HAS BEEN REVIEWED INDEPENDENTLY AND REVIEWED WITH THE PATIENT.  ALL LAB DATA HAS BEEN REVIEWED.  PATIENT HAD MINIMAL SIDE EFFECT OF CHEMOTHERAPY.  I DISCUSSED PROS AND COns of  2 CYCLES OF CONSULTATION THERAPY PATIENT AND FAMILY WOULD LIKE TO CONSIDER THAT.  Patient expressed understanding  and was in agreement with this plan. She also understands that She can call clinic at any time with any questions, concerns, or complaints.    Carcinoma of right lung   Staging form: Lung, AJCC 7th Edition     Clinical: Stage IIIA (T4, N1, M0) - Unsigned   Forest Gleason, MD   01/11/2015 11:24 AM

## 2015-01-24 ENCOUNTER — Other Ambulatory Visit: Payer: Self-pay | Admitting: *Deleted

## 2015-01-24 DIAGNOSIS — C3491 Malignant neoplasm of unspecified part of right bronchus or lung: Secondary | ICD-10-CM

## 2015-01-29 ENCOUNTER — Inpatient Hospital Stay (HOSPITAL_BASED_OUTPATIENT_CLINIC_OR_DEPARTMENT_OTHER): Payer: Medicare Other | Admitting: Oncology

## 2015-01-29 ENCOUNTER — Inpatient Hospital Stay: Payer: Medicare Other | Attending: Oncology

## 2015-01-29 ENCOUNTER — Inpatient Hospital Stay: Payer: Medicare Other

## 2015-01-29 VITALS — BP 150/90 | HR 87 | Temp 95.5°F | Wt 159.2 lb

## 2015-01-29 DIAGNOSIS — C3401 Malignant neoplasm of right main bronchus: Secondary | ICD-10-CM | POA: Insufficient documentation

## 2015-01-29 DIAGNOSIS — I10 Essential (primary) hypertension: Secondary | ICD-10-CM

## 2015-01-29 DIAGNOSIS — I2510 Atherosclerotic heart disease of native coronary artery without angina pectoris: Secondary | ICD-10-CM

## 2015-01-29 DIAGNOSIS — Z951 Presence of aortocoronary bypass graft: Secondary | ICD-10-CM

## 2015-01-29 DIAGNOSIS — Z79899 Other long term (current) drug therapy: Secondary | ICD-10-CM | POA: Diagnosis not present

## 2015-01-29 DIAGNOSIS — D701 Agranulocytosis secondary to cancer chemotherapy: Secondary | ICD-10-CM | POA: Diagnosis not present

## 2015-01-29 DIAGNOSIS — Z9221 Personal history of antineoplastic chemotherapy: Secondary | ICD-10-CM | POA: Diagnosis not present

## 2015-01-29 DIAGNOSIS — E785 Hyperlipidemia, unspecified: Secondary | ICD-10-CM | POA: Diagnosis not present

## 2015-01-29 DIAGNOSIS — R5383 Other fatigue: Secondary | ICD-10-CM | POA: Diagnosis not present

## 2015-01-29 DIAGNOSIS — R531 Weakness: Secondary | ICD-10-CM | POA: Insufficient documentation

## 2015-01-29 DIAGNOSIS — Z5111 Encounter for antineoplastic chemotherapy: Secondary | ICD-10-CM | POA: Diagnosis not present

## 2015-01-29 DIAGNOSIS — T451X5S Adverse effect of antineoplastic and immunosuppressive drugs, sequela: Secondary | ICD-10-CM | POA: Diagnosis not present

## 2015-01-29 DIAGNOSIS — Z950 Presence of cardiac pacemaker: Secondary | ICD-10-CM | POA: Insufficient documentation

## 2015-01-29 DIAGNOSIS — Z7982 Long term (current) use of aspirin: Secondary | ICD-10-CM | POA: Diagnosis not present

## 2015-01-29 DIAGNOSIS — Z7901 Long term (current) use of anticoagulants: Secondary | ICD-10-CM

## 2015-01-29 DIAGNOSIS — C3491 Malignant neoplasm of unspecified part of right bronchus or lung: Secondary | ICD-10-CM

## 2015-01-29 DIAGNOSIS — G629 Polyneuropathy, unspecified: Secondary | ICD-10-CM | POA: Diagnosis not present

## 2015-01-29 DIAGNOSIS — Z923 Personal history of irradiation: Secondary | ICD-10-CM | POA: Insufficient documentation

## 2015-01-29 DIAGNOSIS — Z87891 Personal history of nicotine dependence: Secondary | ICD-10-CM | POA: Diagnosis not present

## 2015-01-29 DIAGNOSIS — Z9223 Personal history of estrogen therapy: Secondary | ICD-10-CM

## 2015-01-29 LAB — CBC WITH DIFFERENTIAL/PLATELET
Basophils Absolute: 0 10*3/uL (ref 0–0.1)
Basophils Relative: 1 %
Eosinophils Absolute: 0.5 10*3/uL (ref 0–0.7)
Eosinophils Relative: 11 %
HCT: 37.6 % (ref 35.0–47.0)
HEMOGLOBIN: 12.3 g/dL (ref 12.0–16.0)
LYMPHS ABS: 0.9 10*3/uL — AB (ref 1.0–3.6)
Lymphocytes Relative: 22 %
MCH: 30.7 pg (ref 26.0–34.0)
MCHC: 32.7 g/dL (ref 32.0–36.0)
MCV: 93.8 fL (ref 80.0–100.0)
MONO ABS: 0.5 10*3/uL (ref 0.2–0.9)
Monocytes Relative: 13 %
Neutro Abs: 2.2 10*3/uL (ref 1.4–6.5)
Neutrophils Relative %: 53 %
PLATELETS: 163 10*3/uL (ref 150–440)
RBC: 4.01 MIL/uL (ref 3.80–5.20)
RDW: 15.8 % — ABNORMAL HIGH (ref 11.5–14.5)
WBC: 4.1 10*3/uL (ref 3.6–11.0)

## 2015-01-29 LAB — COMPREHENSIVE METABOLIC PANEL
ALBUMIN: 4 g/dL (ref 3.5–5.0)
ALT: 13 U/L — ABNORMAL LOW (ref 14–54)
AST: 21 U/L (ref 15–41)
Alkaline Phosphatase: 65 U/L (ref 38–126)
Anion gap: 5 (ref 5–15)
BUN: 18 mg/dL (ref 6–20)
CO2: 31 mmol/L (ref 22–32)
Calcium: 9.3 mg/dL (ref 8.9–10.3)
Chloride: 103 mmol/L (ref 101–111)
Creatinine, Ser: 0.81 mg/dL (ref 0.44–1.00)
GLUCOSE: 107 mg/dL — AB (ref 65–99)
POTASSIUM: 3.7 mmol/L (ref 3.5–5.1)
SODIUM: 139 mmol/L (ref 135–145)
Total Bilirubin: 0.6 mg/dL (ref 0.3–1.2)
Total Protein: 7.2 g/dL (ref 6.5–8.1)

## 2015-01-29 MED ORDER — SODIUM CHLORIDE 0.9 % IJ SOLN
10.0000 mL | Freq: Once | INTRAMUSCULAR | Status: AC
  Administered 2015-01-29: 10 mL via INTRAVENOUS
  Filled 2015-01-29: qty 10

## 2015-01-29 MED ORDER — HEPARIN SOD (PORK) LOCK FLUSH 100 UNIT/ML IV SOLN
500.0000 [IU] | Freq: Once | INTRAVENOUS | Status: AC
  Administered 2015-01-29: 500 [IU] via INTRAVENOUS
  Filled 2015-01-29: qty 5

## 2015-01-29 MED ORDER — SODIUM CHLORIDE 0.9 % IV SOLN: Freq: Once | INTRAVENOUS | Status: DC

## 2015-01-29 MED ORDER — PALONOSETRON HCL INJECTION 0.25 MG/5ML
0.2500 mg | Freq: Once | INTRAVENOUS | Status: AC
  Administered 2015-01-29: 0.25 mg via INTRAVENOUS
  Filled 2015-01-29: qty 5

## 2015-01-29 MED ORDER — DIPHENHYDRAMINE HCL 50 MG/ML IJ SOLN
50.0000 mg | Freq: Once | INTRAMUSCULAR | Status: AC
  Administered 2015-01-29: 50 mg via INTRAVENOUS
  Filled 2015-01-29: qty 1

## 2015-01-29 MED ORDER — FAMOTIDINE IN NACL 20-0.9 MG/50ML-% IV SOLN
20.0000 mg | Freq: Once | INTRAVENOUS | Status: AC
  Administered 2015-01-29: 20 mg via INTRAVENOUS
  Filled 2015-01-29: qty 50

## 2015-01-29 MED ORDER — SODIUM CHLORIDE 0.9 % IV SOLN
300.0000 mg | Freq: Once | INTRAVENOUS | Status: AC
  Administered 2015-01-29: 300 mg via INTRAVENOUS
  Filled 2015-01-29: qty 30

## 2015-01-29 MED ORDER — PACLITAXEL CHEMO INJECTION 300 MG/50ML
80.0000 mg/m2 | Freq: Once | INTRAVENOUS | Status: AC
  Administered 2015-01-29: 144 mg via INTRAVENOUS
  Filled 2015-01-29: qty 24

## 2015-01-29 MED ORDER — SODIUM CHLORIDE 0.9 % IV SOLN
Freq: Once | INTRAVENOUS | Status: AC
  Administered 2015-01-29: 12:00:00 via INTRAVENOUS
  Filled 2015-01-29: qty 1000

## 2015-01-29 MED ORDER — SODIUM CHLORIDE 0.9 % IV SOLN
Freq: Once | INTRAVENOUS | Status: AC
  Administered 2015-01-29: 13:00:00 via INTRAVENOUS
  Filled 2015-01-29: qty 5

## 2015-01-29 NOTE — Progress Notes (Signed)
Pt former smoker - quit 2002.  Does not have living will.

## 2015-01-31 ENCOUNTER — Other Ambulatory Visit: Payer: Self-pay | Admitting: Oncology

## 2015-01-31 ENCOUNTER — Encounter: Payer: Self-pay | Admitting: Oncology

## 2015-01-31 NOTE — Progress Notes (Signed)
[place] @ [facility] Telephone:(336) [phone]  Fax:(336) [phone]     Jenny Cooper OB: Dec 20, 1935  MR#: [id number]  PYP#:[id number]  Patient Care Team: Gayland Curry, MD as PCP - General (Family Medicine)  CHIEF COMPLAINT:  Chief Complaint  Patient presents with  . 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   Patient had compression of the right mainstem bronchus because of enlarged lymph node and a mass. Stage is T4 N1 M0 tumor stage IIIa diagnosis in January of 2016 2.started on radiation and chemotherapy from October 21, 2014 3.finished 6 cycles of carboplatinum and Taxol  in March  29 th of 2016, As finished concurrent chemoradiation therapy and PET scan shows significant response 4.basins started on consultation chemotherapy with carboplatin and Taxol       Carcinoma of right lung   01/03/2015 Initial Diagnosis Carcinoma of right lung    Epidermoid carcinoma of lung   10/04/2014 Initial Diagnosis Epidermoid carcinoma of lung    Oncology Flowsheet 01/29/2015  Day, Cycle Day 1, Cycle 1  CARBOplatin (PARAPLATIN) IV 300 mg  dexamethasone (DECADRON) IV [ 12 mg ]  fosaprepitant (EMEND) IV [ 150 mg ]  PACLitaxel (TAXOL) IV 80 mg/m2  palonosetron (ALOXI) IV 0.25 mg    INTERVAL HISTORY: 79 year old lady was finished chemoradiation therapy came today further follow-up regarding carcinoma of lung.  Patient has repeat PET scan which shows significant improvement.  Patient did not have any significant side effect of chemotherapy here to discuss further treatment planning.  Cough is improved. January 29, 2015 Patient is here to discuss consolidation chemotherapy.  Overall feeling well.  No chills.  No fever.  She hadnumber of questions which were answered REVIEW OF SYSTEMS:   Gen. status: Patient is a  alert.  Not in any acute distress.  Lungs: Dry hacking cough is improved.  No hemoptysis or chest pain HEENT: No soreness in the mouth.  No difficulty swallowing.  Cardiac: Patient had number of cardiac issues including atrial flutter coronary artery disease and pacemaker.  GU: No dysuria or hematuria.  Skin: No rash.  Neurological system no tingling numbness in upper extremity.  Lower extremity neuropathy has not changed.  Lower extremity no edema musculoskeletal system no bony pain.  Skin no rash or ecchymosis.  As per HPI. Otherwise, a complete review of systems is negatve.  PAST MEDICAL HISTORY: Past Medical History  Diagnosis Date  . HTN (hypertension)   . Pacemaker   . Coronary artery disease   . HLD (hyperlipidemia)   . CAD (coronary artery disease) unknown  . Lung cancer   . History of colonoscopy 2013  . History of mammography, screening 2015  . History of Papanicolaou smear of cervix 2013  . Carcinoma of right lung 01/03/2015    PAST SURGICAL HISTORY: Past Surgical History  Procedure Laterality Date  . Appendectomy    . Vaginal hysterectomy    . Cardiac bypass    . Pacemaker insertion N/A     FAMILY HISTORY Family History  Problem Relation Age of Onset  . COPD Mother     sister, and brother        ADVANCED DIRECTIVES: Patient does have advanced directive   HEALTH MAINTENANCE: History  Substance Use Topics  . Smoking status: Former Smoker -- 2.00 packs/day for 42  years    Types: Cigarettes    Quit date: 08/30/2000  . Smokeless tobacco: Not on file     Comment: quit smoking in 08/28/2000  . Alcohol Use: 6.0 oz/week    10 Glasses of wine per week     Comment: 2 glasses of wine per day     Allergies  Allergen Reactions  . Lovenox [Enoxaparin Sodium] Itching  . Meperidine Other (See Comments)    Other Reaction: pt does not like how it makes her feel  . Enoxaparin Rash    Other Reaction: OTHER REACTION    Current Outpatient Prescriptions  Medication  Sig Dispense Refill  . aspirin 81 MG tablet Take 81 mg by mouth daily.    Marland Kitchen atenolol (TENORMIN) 25 MG tablet Take 25 mg by mouth daily.    Marland Kitchen atorvastatin (LIPITOR) 20 MG tablet Take 1 tablet by mouth daily.    Marland Kitchen azelastine (ASTELIN) 0.1 % nasal spray Place 2 sprays into both nostrils 2 (two) times daily. Use in each nostril as directed    . calcium carbonate (OS-CAL) 600 MG TABS tablet Take 600 mg by mouth 2 (two) times daily with a meal.    . [employer] ALLERGY RELIEF 180 MG tablet Take 180 mg by mouth daily.  5  . ELIQUIS 5 MG TABS tablet Take 1 tablet by mouth 2 (two) times daily.   11  . hydrochlorothiazide (MICROZIDE) 12.5 MG capsule Take 12.5 mg by mouth daily.    Marland Kitchen levothyroxine (SYNTHROID, LEVOTHROID) 75 MCG tablet Take 75 mcg by mouth daily before breakfast.    . losartan (COZAAR) 25 MG tablet Take 25 mg by mouth daily.    Marland Kitchen losartan (COZAAR) 25 MG tablet Take 1 tablet by mouth daily.    . Multiple Vitamin (MULTI-VITAMINS) TABS Take 1 tablet by mouth daily.    Marland Kitchen HYDROcodone-acetaminophen (NORCO/VICODIN) 5-325 MG per tablet Take 1 tablet by mouth every 6 (six) hours as needed (for cough). (Patient not taking: Reported on 01/29/2015) 30 tablet 0  . ipratropium-albuterol (DUONEB) 0.5-2.5 (3) MG/3ML SOLN Inhale 3 mLs into the lungs 2 (two) times daily.  3  . Multiple Vitamin (MULTIVITAMIN) tablet Take 1 tablet by mouth daily.    Marland Kitchen omeprazole (PRILOSEC) 20 MG capsule Take 20 mg by mouth daily.    Marland Kitchen PRILOSEC OTC 20 MG tablet Take 2 tablets by mouth daily before supper. 30 minutes before supper  12   No current facility-administered medications for this visit.    OBJECTIVE:  Filed Vitals:   01/29/15 1043  BP: 150/90  Pulse: 87  Temp: 95.5 F (35.3 C)     Body mass index is 24.92 kg/(m^2).    ECOG FS:1 - Symptomatic but completely ambulatory  PHYSICAL EXAM: GENERAL:  Well developed, well nourished, sitting comfortably in the exam room in no acute distress. MENTAL STATUS:  Alert and oriented  to person, place and time. HEAD:   Normocephalic, atraumatic, face symmetric, no Cushingoid features. EYES: eyes.  Pupils equal round and reactive to light and accomodation.  No conjunctivitis or scleral icterus. ENT:  Oropharynx clear without lesion.  Tongue normal. Mucous membranes moist.  RESPIRATORY:  Clear to auscultation without rales, wheezes or rhonchi. CARDIOVASCULAR:  Regular rate and rhythm without murmur, rub or gallop. . ABDOMEN:  Soft, non-tender, with active bowel sounds, and no hepatosplenomegaly.  No masses. BACK:  No CVA tenderness.  No tenderness on percussion of the back or rib cage. SKIN:  No rashes, ulcers or lesions. EXTREMITIES:  No edema, no skin discoloration or tenderness.  No palpable cords. LYMPH NODES: No palpable cervical, supraclavicular, axillary or inguinal adenopathy  NEUROLOGICAL: Unremarkable. PSYCH:  Appropriate.   LAB RESULTS:  Infusion on 01/29/2015  Component Date Value Ref Range Status  . WBC 01/29/2015 4.1  3.6 - 11.0 K/uL Final   A-LINE DRAW  . RBC 01/29/2015 4.01  3.80 - 5.20 MIL/uL Final  . Hemoglobin 01/29/2015 12.3  12.0 - 16.0 g/dL Final  . HCT 01/29/2015 37.6  35.0 - 47.0 % Final  . MCV 01/29/2015 93.8  80.0 - 100.0 fL Final  . MCH 01/29/2015 30.7  26.0 - 34.0 pg Final  . MCHC 01/29/2015 32.7  32.0 - 36.0 g/dL Final  . RDW 01/29/2015 15.8* 11.5 - 14.5 % Final  . Platelets 01/29/2015 163  150 - 440 K/uL Final  . Neutrophils Relative % 01/29/2015 53   Final  . Neutro Abs 01/29/2015 2.2  1.4 - 6.5 K/uL Final  . Lymphocytes Relative 01/29/2015 22   Final  . Lymphs Abs 01/29/2015 0.9* 1.0 - 3.6 K/uL Final  . Monocytes Relative 01/29/2015 13   Final  . Monocytes Absolute 01/29/2015 0.5  0.2 - 0.9 K/uL Final  . Eosinophils Relative 01/29/2015 11   Final  . Eosinophils Absolute 01/29/2015 0.5  0 - 0.7 K/uL Final  . Basophils Relative 01/29/2015 1   Final  . Basophils Absolute 01/29/2015 0.0  0 - 0.1 K/uL Final  . Sodium 01/29/2015 139   135 - 145 mmol/L Final  . Potassium 01/29/2015 3.7  3.5 - 5.1 mmol/L Final  . Chloride 01/29/2015 103  101 - 111 mmol/L Final  . CO2 01/29/2015 31  22 - 32 mmol/L Final  . Glucose, Bld 01/29/2015 107* 65 - 99 mg/dL Final  . BUN 01/29/2015 18  6 - 20 mg/dL Final  . Creatinine, Ser 01/29/2015 0.81  0.44 - 1.00 mg/dL Final  . Calcium 01/29/2015 9.3  8.9 - 10.3 mg/dL Final  . Total Protein 01/29/2015 7.2  6.5 - 8.1 g/dL Final  . Albumin 01/29/2015 4.0  3.5 - 5.0 g/dL Final  . AST 01/29/2015 21  15 - 41 U/L Final  . ALT 01/29/2015 13* 14 - 54 U/L Final  . Alkaline Phosphatase 01/29/2015 65  38 - 126 U/L Final  . Total Bilirubin 01/29/2015 0.6  0.3 - 1.2 mg/dL Final  . GFR calc non Af Amer 01/29/2015 >60  >60 mL/min Final  . GFR calc Af Amer 01/29/2015 >60  >60 mL/min Final   Comment: (NOTE) The eGFR has been calculated using the CKD EPI equation. This calculation has not been validated in all clinical situations. eGFR's persistently <60 mL/min signify possible Chronic Kidney Disease.   . Anion gap 01/29/2015 5  5 - 15 Final     STUDIES: Nm Pet Image Restag (ps) Skull Base To Thigh  01/06/2015   CLINICAL DATA:  Subsequent treatment strategy for restaging of lung cancer. Right-sided lung biopsy 3/16. Chemotherapy 3/16. Radiation therapy 4/16.  EXAM: NUCLEAR MEDICINE PET SKULL BASE TO THIGH  TECHNIQUE: 12.4 MCi F-18 FDG was injected intravenously. Full-ring PET imaging was performed from the skull base to thigh after the radiotracer. CT data was obtained and used for attenuation correction and anatomic localization.  FASTING BLOOD GLUCOSE:  Value: 114 mg/dl  COMPARISON:  Chest CT of 09/25/2014.  Prior PET of 09/23/2014.  FINDINGS: NECK  No areas of abnormal hypermetabolism.  CHEST  Marked improvement in appearance of the right perihilar upper  lobe and right hilum. At the site of the previously described 5.1 x 2.4 cm mass, there is hypermetabolism and soft tissue thickening about the medial aspect  of the recannulized right upper lobe bronchus and superomedial right hilum. This measures a S.U.V. max of 7.1, including on image 90 of series 3. No well-defined residual measurable mass identified.  No new hypermetabolism within the mediastinum or left hilum.  ABDOMEN/PELVIS  No areas of abnormal hypermetabolism.  SKELETON  No suspicious osseous hypermetabolism. Hypermetabolism about the right rotator cuff is likely degenerative.  CT IMAGES PERFORMED FOR ATTENUATION CORRECTION  No cervical adenopathy.  Left carotid atherosclerosis.  Prior median sternotomy. Pacer. Mild cardiomegaly. Resolved right upper lobe postobstructive pneumonitis.  Normal adrenal glands.  Right greater than left hip osteoarthritis.  IMPRESSION: 1. Marked response to therapy. Mild residual hypermetabolism and soft tissue thickening about the central right upper lobe and right hilum. Although this could be treatment related, residual low volume disease cannot be excluded. 2. No evidence of distant metastasis.   Electronically Signed   By: Abigail Miyamoto M.D.   On: 01/06/2015 12:11      Assessment and plan I reviewed role of consolidation chemotherapy All the side effects of chemotherapy including myelosuppression, alopecia, nausea vomiting fatigue weaInformed consent has been optingkness.  Secondary infection, and   peripheral neuropathy .  Has been discussed in details. Informal consent has been obtained and will be documented by nurses in the chart Intent of chemotherapy   is   Cure Proceed with carboplatinum and Taxol day 1 and 8 schedule x2  All lab data has been reviewed Informed consent has been opting     Forest Gleason, MD   01/31/2015 10:35 AM

## 2015-02-03 ENCOUNTER — Ambulatory Visit: Payer: Medicare Other | Admitting: Radiation Oncology

## 2015-02-05 ENCOUNTER — Ambulatory Visit: Payer: Medicare Other | Admitting: Radiation Oncology

## 2015-02-05 ENCOUNTER — Inpatient Hospital Stay: Payer: Medicare Other

## 2015-02-05 VITALS — BP 112/66 | HR 60 | Temp 96.5°F | Resp 20

## 2015-02-05 DIAGNOSIS — Z5111 Encounter for antineoplastic chemotherapy: Secondary | ICD-10-CM | POA: Diagnosis not present

## 2015-02-05 DIAGNOSIS — C3491 Malignant neoplasm of unspecified part of right bronchus or lung: Secondary | ICD-10-CM

## 2015-02-05 LAB — CBC WITH DIFFERENTIAL/PLATELET
Basophils Absolute: 0 10*3/uL (ref 0–0.1)
Basophils Relative: 1 %
Eosinophils Absolute: 0.3 10*3/uL (ref 0–0.7)
Eosinophils Relative: 12 %
HCT: 36.7 % (ref 35.0–47.0)
Hemoglobin: 11.9 g/dL — ABNORMAL LOW (ref 12.0–16.0)
Lymphocytes Relative: 34 %
Lymphs Abs: 0.9 10*3/uL — ABNORMAL LOW (ref 1.0–3.6)
MCH: 30.4 pg (ref 26.0–34.0)
MCHC: 32.4 g/dL (ref 32.0–36.0)
MCV: 93.8 fL (ref 80.0–100.0)
Monocytes Absolute: 0.2 10*3/uL (ref 0.2–0.9)
Monocytes Relative: 6 %
NEUTROS PCT: 47 %
Neutro Abs: 1.3 10*3/uL — ABNORMAL LOW (ref 1.4–6.5)
PLATELETS: 170 10*3/uL (ref 150–440)
RBC: 3.91 MIL/uL (ref 3.80–5.20)
RDW: 14.6 % — AB (ref 11.5–14.5)
WBC: 2.7 10*3/uL — ABNORMAL LOW (ref 3.6–11.0)

## 2015-02-05 MED ORDER — PACLITAXEL CHEMO INJECTION 300 MG/50ML
80.0000 mg/m2 | Freq: Once | INTRAVENOUS | Status: AC
  Administered 2015-02-05: 144 mg via INTRAVENOUS
  Filled 2015-02-05: qty 24

## 2015-02-05 MED ORDER — DIPHENHYDRAMINE HCL 50 MG/ML IJ SOLN
50.0000 mg | Freq: Once | INTRAMUSCULAR | Status: AC
  Administered 2015-02-05: 50 mg via INTRAVENOUS
  Filled 2015-02-05: qty 1

## 2015-02-05 MED ORDER — SODIUM CHLORIDE 0.9 % IV SOLN
Freq: Once | INTRAVENOUS | Status: AC
  Administered 2015-02-05: 15:00:00 via INTRAVENOUS
  Filled 2015-02-05: qty 8

## 2015-02-05 MED ORDER — SODIUM CHLORIDE 0.9 % IJ SOLN
10.0000 mL | INTRAMUSCULAR | Status: DC | PRN
  Administered 2015-02-05: 10 mL via INTRAVENOUS
  Filled 2015-02-05: qty 10

## 2015-02-05 MED ORDER — FAMOTIDINE IN NACL 20-0.9 MG/50ML-% IV SOLN
20.0000 mg | Freq: Once | INTRAVENOUS | Status: AC
  Administered 2015-02-05: 20 mg via INTRAVENOUS
  Filled 2015-02-05: qty 50

## 2015-02-05 MED ORDER — HEPARIN SOD (PORK) LOCK FLUSH 100 UNIT/ML IV SOLN
500.0000 [IU] | Freq: Once | INTRAVENOUS | Status: AC
  Administered 2015-02-05: 500 [IU] via INTRAVENOUS
  Filled 2015-02-05: qty 5

## 2015-02-05 MED ORDER — SODIUM CHLORIDE 0.9 % IV SOLN
Freq: Once | INTRAVENOUS | Status: AC
  Administered 2015-02-05: 15:00:00 via INTRAVENOUS
  Filled 2015-02-05: qty 250

## 2015-02-12 ENCOUNTER — Encounter: Payer: Self-pay | Admitting: Radiation Oncology

## 2015-02-12 ENCOUNTER — Ambulatory Visit
Admission: RE | Admit: 2015-02-12 | Discharge: 2015-02-12 | Disposition: A | Discharge status: Home / Self Care | Payer: Medicare Other | Source: Ambulatory Visit | Attending: Radiation Oncology | Admitting: Radiation Oncology

## 2015-02-12 VITALS — BP 132/78 | HR 86 | Temp 96.6°F | Resp 18 | Wt 158.7 lb

## 2015-02-12 DIAGNOSIS — C3491 Malignant neoplasm of unspecified part of right bronchus or lung: Secondary | ICD-10-CM

## 2015-02-12 NOTE — Progress Notes (Signed)
Radiation Oncology Follow up Note  Name: Jenny Cooper   Date:   02/12/2015 MRN:  [id number] DOB: 07/31/1936    This 79 y.o. female presents to the clinic today for follow-up for lung cancer stage IIIa (T4 N1 M0) squamous cell carcinoma status post combined modality radiation and chemotherapy now 1 month out completing radiation.  REFERRING PROVIDER: Gayland Curry, MD  HPI: Patient is a 79 year old female now 1 month out having completed radiation therapy to her right chest for stage IIIa squamous cell carcinoma the right lung hilum. She is seen today in routine follow-up and is doing well she is currently on consolidative chemotherapy with carboplatin and Taxol. After completion of radiation approximately one month ago she had a repeat PET CT scan showed excellent response to therapy with only minimal hypermetabolic activity in the right hilum remaining. She symptomatically is doing well specifically denies cough hemoptysis or chest tightness. She is having no dysphasia at this time.  COMPLICATIONS OF TREATMENT: none  FOLLOW UP COMPLIANCE: keeps appointments   PHYSICAL EXAM:  BP 132/78 mmHg  Pulse 86  Temp(Src) 96.6 F (35.9 C)  Resp 18  Wt 158 lb 11.7 oz (72 kg) Well-developed well-nourished patient in NAD. HEENT reveals PERLA, EOMI, discs not visualized.  Oral cavity is clear. No oral mucosal lesions are identified. Neck is clear without evidence of cervical or supraclavicular adenopathy. Lungs are clear to A&P. Cardiac examination is essentially unremarkable with regular rate and rhythm without murmur rub or thrill. Abdomen is benign with no organomegaly or masses noted. Motor sensory and DTR levels are equal and symmetric in the upper and lower extremities. Cranial nerves II through XII are grossly intact. Proprioception is intact. No peripheral adenopathy or edema is identified. No motor or sensory levels are noted. Crude visual fields are within normal range.   RADIOLOGY  RESULTS: PET CT scan is reviewed   PLAN: At the present time she's had a excellent response to chemotherapy and radiation therapy. She's currently on consolidative chemotherapy. I am please were overall progress. I have asked to see her back in 4-5 months for follow-up. She continues close follow-up care with medical oncology and continues with 2 more cycles of chemotherapy. Patient knows to contact my office with any concerns.  I would like to take this opportunity for allowing me to participate in the care of your patient.Armstead Peaks., MD

## 2015-02-19 ENCOUNTER — Inpatient Hospital Stay: Payer: Medicare Other

## 2015-02-19 ENCOUNTER — Inpatient Hospital Stay (HOSPITAL_BASED_OUTPATIENT_CLINIC_OR_DEPARTMENT_OTHER): Payer: Medicare Other | Admitting: Oncology

## 2015-02-19 VITALS — BP 130/83 | HR 88 | Temp 98.8°F | Wt 160.9 lb

## 2015-02-19 DIAGNOSIS — Z9221 Personal history of antineoplastic chemotherapy: Secondary | ICD-10-CM | POA: Diagnosis not present

## 2015-02-19 DIAGNOSIS — Z79899 Other long term (current) drug therapy: Secondary | ICD-10-CM

## 2015-02-19 DIAGNOSIS — C3401 Malignant neoplasm of right main bronchus: Secondary | ICD-10-CM | POA: Diagnosis not present

## 2015-02-19 DIAGNOSIS — C3491 Malignant neoplasm of unspecified part of right bronchus or lung: Secondary | ICD-10-CM

## 2015-02-19 DIAGNOSIS — R531 Weakness: Secondary | ICD-10-CM

## 2015-02-19 DIAGNOSIS — Z923 Personal history of irradiation: Secondary | ICD-10-CM | POA: Diagnosis not present

## 2015-02-19 DIAGNOSIS — Z950 Presence of cardiac pacemaker: Secondary | ICD-10-CM

## 2015-02-19 DIAGNOSIS — D701 Agranulocytosis secondary to cancer chemotherapy: Secondary | ICD-10-CM | POA: Diagnosis not present

## 2015-02-19 DIAGNOSIS — E785 Hyperlipidemia, unspecified: Secondary | ICD-10-CM

## 2015-02-19 DIAGNOSIS — T451X5S Adverse effect of antineoplastic and immunosuppressive drugs, sequela: Secondary | ICD-10-CM

## 2015-02-19 DIAGNOSIS — R5383 Other fatigue: Secondary | ICD-10-CM

## 2015-02-19 DIAGNOSIS — I2510 Atherosclerotic heart disease of native coronary artery without angina pectoris: Secondary | ICD-10-CM

## 2015-02-19 DIAGNOSIS — I10 Essential (primary) hypertension: Secondary | ICD-10-CM

## 2015-02-19 DIAGNOSIS — Z5111 Encounter for antineoplastic chemotherapy: Secondary | ICD-10-CM | POA: Diagnosis not present

## 2015-02-19 LAB — COMPREHENSIVE METABOLIC PANEL
ALBUMIN: 3.7 g/dL (ref 3.5–5.0)
ALK PHOS: 61 U/L (ref 38–126)
ALT: 17 U/L (ref 14–54)
AST: 26 U/L (ref 15–41)
Anion gap: 5 (ref 5–15)
BILIRUBIN TOTAL: 0.5 mg/dL (ref 0.3–1.2)
BUN: 17 mg/dL (ref 6–20)
CO2: 30 mmol/L (ref 22–32)
Calcium: 8.5 mg/dL — ABNORMAL LOW (ref 8.9–10.3)
Chloride: 102 mmol/L (ref 101–111)
Creatinine, Ser: 0.83 mg/dL (ref 0.44–1.00)
GFR calc Af Amer: >60 mL/min (ref >60)
GFR calc non Af Amer: >60 mL/min (ref >60)
Glucose, Bld: 107 mg/dL — ABNORMAL HIGH (ref 65–99)
Potassium: 3.7 mmol/L (ref 3.5–5.1)
Sodium: 137 mmol/L (ref 135–145)
Total Protein: 6.9 g/dL (ref 6.5–8.1)

## 2015-02-19 LAB — CBC WITH DIFFERENTIAL/PLATELET
Basophils Absolute: 0 10*3/uL (ref 0–0.1)
Basophils Relative: 1 %
EOS ABS: 0.1 10*3/uL (ref 0–0.7)
EOS PCT: 2 %
HEMATOCRIT: 35.4 % (ref 35.0–47.0)
Hemoglobin: 11.5 g/dL — ABNORMAL LOW (ref 12.0–16.0)
LYMPHS ABS: 0.6 10*3/uL — AB (ref 1.0–3.6)
LYMPHS PCT: 27 %
MCH: 30.6 pg (ref 26.0–34.0)
MCHC: 32.4 g/dL (ref 32.0–36.0)
MCV: 94.3 fL (ref 80.0–100.0)
Monocytes Absolute: 0.5 10*3/uL (ref 0.2–0.9)
Monocytes Relative: 25 %
Neutro Abs: 0.9 10*3/uL — ABNORMAL LOW (ref 1.4–6.5)
Neutrophils Relative %: 45 %
Platelets: 171 10*3/uL (ref 150–440)
RBC: 3.75 MIL/uL — AB (ref 3.80–5.20)
RDW: 15.3 % — ABNORMAL HIGH (ref 11.5–14.5)
WBC: 2.1 10*3/uL — AB (ref 3.6–11.0)

## 2015-02-19 MED ORDER — HEPARIN SOD (PORK) LOCK FLUSH 100 UNIT/ML IV SOLN
500.0000 [IU] | Freq: Once | INTRAVENOUS | Status: AC
  Administered 2015-02-19: 500 [IU] via INTRAVENOUS
  Filled 2015-02-19: qty 5

## 2015-02-19 MED ORDER — SODIUM CHLORIDE 0.9 % IJ SOLN
10.0000 mL | INTRAMUSCULAR | Status: AC | PRN
  Administered 2015-02-19: 10 mL via INTRAVENOUS
  Filled 2015-02-19: qty 10

## 2015-02-19 NOTE — Progress Notes (Signed)
Patient does not have living will.  Former smoker. 

## 2015-02-26 ENCOUNTER — Inpatient Hospital Stay: Payer: Medicare Other

## 2015-02-26 ENCOUNTER — Encounter: Payer: Self-pay | Admitting: Oncology

## 2015-02-26 ENCOUNTER — Inpatient Hospital Stay (HOSPITAL_BASED_OUTPATIENT_CLINIC_OR_DEPARTMENT_OTHER): Payer: Medicare Other | Admitting: Oncology

## 2015-02-26 ENCOUNTER — Telehealth: Payer: Self-pay

## 2015-02-26 VITALS — BP 124/81 | HR 88 | Temp 94.9°F | Wt 158.5 lb

## 2015-02-26 VITALS — BP 142/74 | HR 86 | Resp 20

## 2015-02-26 DIAGNOSIS — Z79899 Other long term (current) drug therapy: Secondary | ICD-10-CM

## 2015-02-26 DIAGNOSIS — C3401 Malignant neoplasm of right main bronchus: Secondary | ICD-10-CM

## 2015-02-26 DIAGNOSIS — Z9223 Personal history of estrogen therapy: Secondary | ICD-10-CM

## 2015-02-26 DIAGNOSIS — Z9221 Personal history of antineoplastic chemotherapy: Secondary | ICD-10-CM | POA: Diagnosis not present

## 2015-02-26 DIAGNOSIS — I2510 Atherosclerotic heart disease of native coronary artery without angina pectoris: Secondary | ICD-10-CM

## 2015-02-26 DIAGNOSIS — I10 Essential (primary) hypertension: Secondary | ICD-10-CM

## 2015-02-26 DIAGNOSIS — G629 Polyneuropathy, unspecified: Secondary | ICD-10-CM | POA: Diagnosis not present

## 2015-02-26 DIAGNOSIS — C3491 Malignant neoplasm of unspecified part of right bronchus or lung: Secondary | ICD-10-CM

## 2015-02-26 DIAGNOSIS — E785 Hyperlipidemia, unspecified: Secondary | ICD-10-CM

## 2015-02-26 DIAGNOSIS — T451X5S Adverse effect of antineoplastic and immunosuppressive drugs, sequela: Secondary | ICD-10-CM

## 2015-02-26 DIAGNOSIS — Z950 Presence of cardiac pacemaker: Secondary | ICD-10-CM

## 2015-02-26 DIAGNOSIS — Z87891 Personal history of nicotine dependence: Secondary | ICD-10-CM

## 2015-02-26 DIAGNOSIS — Z5111 Encounter for antineoplastic chemotherapy: Secondary | ICD-10-CM | POA: Diagnosis not present

## 2015-02-26 DIAGNOSIS — Z7982 Long term (current) use of aspirin: Secondary | ICD-10-CM

## 2015-02-26 LAB — CBC WITH DIFFERENTIAL/PLATELET
BASOS PCT: 1 %
Basophils Absolute: 0 10*3/uL (ref 0–0.1)
EOS ABS: 0.3 10*3/uL (ref 0–0.7)
Eosinophils Relative: 8 %
HEMATOCRIT: 35.7 % (ref 35.0–47.0)
Hemoglobin: 11.7 g/dL — ABNORMAL LOW (ref 12.0–16.0)
Lymphocytes Relative: 26 %
Lymphs Abs: 0.8 10*3/uL — ABNORMAL LOW (ref 1.0–3.6)
MCH: 30.7 pg (ref 26.0–34.0)
MCHC: 32.9 g/dL (ref 32.0–36.0)
MCV: 93.4 fL (ref 80.0–100.0)
MONO ABS: 0.5 10*3/uL (ref 0.2–0.9)
MONOS PCT: 18 %
Neutro Abs: 1.5 10*3/uL (ref 1.4–6.5)
Neutrophils Relative %: 47 %
Platelets: 198 10*3/uL (ref 150–440)
RBC: 3.82 MIL/uL (ref 3.80–5.20)
RDW: 14.7 % — ABNORMAL HIGH (ref 11.5–14.5)
WBC: 3.1 10*3/uL — ABNORMAL LOW (ref 3.6–11.0)

## 2015-02-26 LAB — CREATININE, SERUM
Creatinine, Ser: 0.98 mg/dL (ref 0.44–1.00)
GFR calc Af Amer: >60 mL/min (ref >60)
GFR, EST NON AFRICAN AMERICAN: 54 mL/min — AB (ref >60)

## 2015-02-26 MED ORDER — HEPARIN SOD (PORK) LOCK FLUSH 100 UNIT/ML IV SOLN
500.0000 [IU] | Freq: Once | INTRAVENOUS | Status: AC | PRN
  Administered 2015-02-26: 500 [IU]
  Filled 2015-02-26: qty 5

## 2015-02-26 MED ORDER — DIPHENHYDRAMINE HCL 50 MG/ML IJ SOLN
50.0000 mg | Freq: Once | INTRAMUSCULAR | Status: AC
  Administered 2015-02-26: 50 mg via INTRAVENOUS
  Filled 2015-02-26: qty 1

## 2015-02-26 MED ORDER — CARBOPLATIN CHEMO INJECTION 450 MG/45ML
390.0000 mg | Freq: Once | INTRAVENOUS | Status: AC
  Administered 2015-02-26: 390 mg via INTRAVENOUS
  Filled 2015-02-26: qty 39

## 2015-02-26 MED ORDER — FAMOTIDINE IN NACL 20-0.9 MG/50ML-% IV SOLN
20.0000 mg | Freq: Once | INTRAVENOUS | Status: AC
  Administered 2015-02-26: 20 mg via INTRAVENOUS
  Filled 2015-02-26: qty 50

## 2015-02-26 MED ORDER — PALONOSETRON HCL INJECTION 0.25 MG/5ML
0.2500 mg | Freq: Once | INTRAVENOUS | Status: AC
  Administered 2015-02-26: 0.25 mg via INTRAVENOUS
  Filled 2015-02-26: qty 5

## 2015-02-26 MED ORDER — PACLITAXEL CHEMO INJECTION 300 MG/50ML
144.0000 mg | Freq: Once | INTRAVENOUS | Status: AC
  Administered 2015-02-26: 144 mg via INTRAVENOUS
  Filled 2015-02-26: qty 24

## 2015-02-26 MED ORDER — SODIUM CHLORIDE 0.9 % IJ SOLN
10.0000 mL | INTRAMUSCULAR | Status: DC | PRN
  Administered 2015-02-26: 10 mL via INTRAVENOUS
  Filled 2015-02-26: qty 10

## 2015-02-26 MED ORDER — SODIUM CHLORIDE 0.9 % IV SOLN
Freq: Once | INTRAVENOUS | Status: AC
  Administered 2015-02-26: 10:00:00 via INTRAVENOUS
  Filled 2015-02-26: qty 1000

## 2015-02-26 MED ORDER — SODIUM CHLORIDE 0.9 % IV SOLN
Freq: Once | INTRAVENOUS | Status: AC
  Administered 2015-02-26: 11:00:00 via INTRAVENOUS
  Filled 2015-02-26: qty 5

## 2015-02-26 NOTE — Progress Notes (Signed)
Patient does not have living will.  Former smoker. 

## 2015-02-26 NOTE — Progress Notes (Signed)
[place] @ [facility] Telephone:(336) [phone]  Fax:(336) [phone]     Jenny Cooper OB: 1936/05/07  MR#: [id number]  WVP#:[id number]  Patient Care Team: Gayland Curry, MD as PCP - General (Family Medicine)  CHIEF COMPLAINT:  Chief Complaint  Patient presents with  . 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   Patient had compression of the right mainstem bronchus because of enlarged lymph node and a mass. Stage is T4 N1 M0 tumor stage IIIa diagnosis in January of 2016 2.started on radiation and chemotherapy from October 21, 2014 3.finished 6 cycles of carboplatinum and Taxol  in March  29 th of 2016, As finished concurrent chemoradiation therapy and PET scan shows significant response 4.basins started on consultation chemotherapy with carboplatin and Taxol       Carcinoma of right lung   01/03/2015 Initial Diagnosis Carcinoma of right lung    Epidermoid carcinoma of lung   10/04/2014 Initial Diagnosis Epidermoid carcinoma of lung    Oncology Flowsheet 01/29/2015 02/05/2015 02/26/2015  Day, Cycle Day 1, Cycle 1 Day 1, Cycle 2 Day 1, Cycle 1  CARBOplatin (PARAPLATIN) IV 300 mg - 390 mg  dexamethasone (DECADRON) IV [ 12 mg ] [ 20 mg ] [ 12 mg ]  fosaprepitant (EMEND) IV [ 150 mg ] - [ 150 mg ]  ondansetron (ZOFRAN) IV - [ 16 mg ] -  PACLitaxel (TAXOL) IV 80 mg/m2 80 mg/m2 144 mg  palonosetron (ALOXI) IV 0.25 mg - 0.25 mg    INTERVAL HISTORY: 79 year old lady was finished chemoradiation therapy came today further follow-up regarding carcinoma of lung.  Patient has repeat PET scan which shows significant improvement.  Patient did not have any significant side effect of chemotherapy here to discuss further treatment planning.  Cough is improved. January 29, 2015 Patient is here to discuss consolidation  chemotherapy.  Overall feeling well.  No chills.  No fever.  She hadnumber of questions which were answered February 26, 2015 Patient is here to initiate second cycle of chemotherapy.  Tolerating treatment very well.  No chills.  No fever.  No nausea.  No vomiting.  No diarrhea.  At his grade 1 neuropathy REVIEW OF SYSTEMS:   Gen. status: Patient is a alert.  Not in any acute distress.  Lungs: Dry hacking cough is improved.  No hemoptysis or chest pain HEENT: No soreness in the mouth.  No difficulty swallowing.  Cardiac: Patient had number of cardiac issues including atrial flutter coronary artery disease and pacemaker.  GU: No dysuria or hematuria.  Skin: No rash.  Neurological system no tingling numbness in upper extremity.  Lower extremity neuropathy has not changed.  Lower extremity no edema musculoskeletal system no bony pain.  Skin no rash or ecchymosis.  As per HPI. Otherwise, a complete review of systems is negatve.  PAST MEDICAL HISTORY: Past Medical History  Diagnosis Date  . HTN (hypertension)   . Pacemaker   . Coronary artery disease   . HLD (hyperlipidemia)   . CAD (coronary artery disease) unknown  . Lung cancer   . History of colonoscopy 2013  . History of mammography, screening 2015  . History of Papanicolaou smear of cervix 2013  . Carcinoma of right lung 01/03/2015    PAST SURGICAL HISTORY: Past Surgical History  Procedure Laterality Date  .  Appendectomy    . Vaginal hysterectomy    . Cardiac bypass    . Pacemaker insertion N/A     FAMILY HISTORY Family History  Problem Relation Age of Onset  . COPD Mother     sister, and brother        ADVANCED DIRECTIVES:Patient does not have any advanced healthcare directive. Information has been given. HEALTH MAINTENANCE: History  Substance Use Topics  . Smoking status: Former Smoker -- 2.00 packs/day for 42 years    Types: Cigarettes    Quit date: 08/30/2000  . Smokeless tobacco: Not on file     Comment: quit  smoking in 08/28/2000  . Alcohol Use: 6.0 oz/week    10 Glasses of wine per week     Comment: 2 glasses of wine per day     Allergies  Allergen Reactions  . Lovenox [Enoxaparin Sodium] Itching  . Meperidine Other (See Comments)    Other Reaction: pt does not like how it makes her feel  . Enoxaparin Rash    Other Reaction: OTHER REACTION    Current Outpatient Prescriptions  Medication Sig Dispense Refill  . aspirin 81 MG tablet Take 81 mg by mouth daily.    Marland Kitchen atenolol (TENORMIN) 25 MG tablet Take 25 mg by mouth daily.    Marland Kitchen atorvastatin (LIPITOR) 20 MG tablet Take by mouth.    Marland Kitchen azelastine (ASTELIN) 0.1 % nasal spray Place 2 sprays into both nostrils 2 (two) times daily. Use in each nostril as directed    . calcium carbonate (OS-CAL) 600 MG TABS tablet Take 600 mg by mouth 2 (two) times daily with a meal.    . [employer] ALLERGY RELIEF 180 MG tablet Take 180 mg by mouth daily.  5  . ELIQUIS 5 MG TABS tablet Take 1 tablet by mouth 2 (two) times daily.   11  . hydrochlorothiazide (MICROZIDE) 12.5 MG capsule Take 12.5 mg by mouth daily.    Marland Kitchen HYDROcodone-acetaminophen (NORCO/VICODIN) 5-325 MG per tablet Take 1 tablet by mouth every 6 (six) hours as needed (for cough). 30 tablet 0  . ipratropium-albuterol (DUONEB) 0.5-2.5 (3) MG/3ML SOLN Inhale 3 mLs into the lungs 2 (two) times daily.  3  . levothyroxine (SYNTHROID, LEVOTHROID) 75 MCG tablet Take 75 mcg by mouth daily before breakfast.    . losartan (COZAAR) 25 MG tablet Take 25 mg by mouth daily.    Marland Kitchen losartan (COZAAR) 25 MG tablet Take 1 tablet by mouth daily.    . Multiple Vitamin (MULTI-VITAMINS) TABS Take 1 tablet by mouth daily.    . Multiple Vitamin (MULTIVITAMIN) tablet Take 1 tablet by mouth daily.    Marland Kitchen omeprazole (PRILOSEC) 20 MG capsule Take 20 mg by mouth daily.    Marland Kitchen PRILOSEC OTC 20 MG tablet Take 2 tablets by mouth daily before supper. 30 minutes before supper  12  . atorvastatin (LIPITOR) 20 MG tablet Take 1 tablet by mouth daily.      No current facility-administered medications for this visit.   Facility-Administered Medications Ordered in Other Visits  Medication Dose Route Frequency Provider Last Rate Last Dose  . sodium chloride 0.9 % injection 10 mL  10 mL Intravenous PRN Forest Gleason, MD   10 mL at 02/19/15 1000    OBJECTIVE:  Filed Vitals:   02/26/15 0927  BP: 124/81  Pulse: 88  Temp: 94.9 F (34.9 C)     Body mass index is 24.82 kg/(m^2).    ECOG FS:1 - Symptomatic but completely ambulatory  PHYSICAL EXAM: GENERAL:  Well developed, well nourished, sitting comfortably in the exam room in no acute distress. MENTAL STATUS:  Alert and oriented to person, place and time. HEAD:   Normocephalic, atraumatic, face symmetric, no Cushingoid features. EYES: eyes.  Pupils equal round and reactive to light and accomodation.  No conjunctivitis or scleral icterus. ENT:  Oropharynx clear without lesion.  Tongue normal. Mucous membranes moist.  RESPIRATORY:  Clear to auscultation without rales, wheezes or rhonchi. CARDIOVASCULAR:  Regular rate and rhythm without murmur, rub or gallop. . ABDOMEN:  Soft, non-tender, with active bowel sounds, and no hepatosplenomegaly.  No masses. BACK:  No CVA tenderness.  No tenderness on percussion of the back or rib cage. SKIN:  No rashes, ulcers or lesions. EXTREMITIES: No edema, no skin discoloration or tenderness.  No palpable cords. LYMPH NODES: No palpable cervical, supraclavicular, axillary or inguinal adenopathy  NEUROLOGICAL: Unremarkable. PSYCH:  Appropriate.   LAB RESULTS:  Infusion on 02/26/2015  Component Date Value Ref Range Status  . WBC 02/26/2015 3.1* 3.6 - 11.0 K/uL Final   A-LINE DRAW  . RBC 02/26/2015 3.82  3.80 - 5.20 MIL/uL Final  . Hemoglobin 02/26/2015 11.7* 12.0 - 16.0 g/dL Final  . HCT 02/26/2015 35.7  35.0 - 47.0 % Final  . MCV 02/26/2015 93.4  80.0 - 100.0 fL Final  . MCH 02/26/2015 30.7  26.0 - 34.0 pg Final  . MCHC 02/26/2015 32.9  32.0 -  36.0 g/dL Final  . RDW 02/26/2015 14.7* 11.5 - 14.5 % Final  . Platelets 02/26/2015 198  150 - 440 K/uL Final  . Neutrophils Relative % 02/26/2015 47   Final  . Neutro Abs 02/26/2015 1.5  1.4 - 6.5 K/uL Final  . Lymphocytes Relative 02/26/2015 26   Final  . Lymphs Abs 02/26/2015 0.8* 1.0 - 3.6 K/uL Final  . Monocytes Relative 02/26/2015 18   Final  . Monocytes Absolute 02/26/2015 0.5  0.2 - 0.9 K/uL Final  . Eosinophils Relative 02/26/2015 8   Final  . Eosinophils Absolute 02/26/2015 0.3  0 - 0.7 K/uL Final  . Basophils Relative 02/26/2015 1   Final  . Basophils Absolute 02/26/2015 0.0  0 - 0.1 K/uL Final  . Creatinine, Ser 02/26/2015 0.98  0.44 - 1.00 mg/dL Final  . GFR calc non Af Amer 02/26/2015 54* >60 mL/min Final  . GFR calc Af Amer 02/26/2015 >60  >60 mL/min Final   Comment: (NOTE) The eGFR has been calculated using the CKD EPI equation. This calculation has not been validated in all clinical situations. eGFR's persistently <60 mL/min signify possible Chronic Kidney Disease.      STUDIES: No results found.    Assessment and plan Proceed with second cycle of chemotherapy Stage III carcinoma of lung with good response to initial induction chemotherapy and radiation therapy On consolidation treatment tolerating treatment very well Grade 1 neuropathy Patient will get day 8 Taxol treatment And then will be reevaluated with PET scan in next few months     Forest Gleason, MD   02/26/2015 7:52 PM

## 2015-02-26 NOTE — Telephone Encounter (Signed)
Carbo AUC changed from AUC 4 to 5

## 2015-03-04 ENCOUNTER — Encounter: Payer: Self-pay | Admitting: Oncology

## 2015-03-04 NOTE — Progress Notes (Signed)
[facility] [phone]  [phone]     Charnae Lill OB: 1935/11/18  MR#: [id number]  RTM#:[id number]  Patient Care Team: Gayland Curry, MD as PCP - General (Family Medicine)  CHIEF COMPLAINT:  Chief Complaint  Patient presents with  . 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   Patient had compression of the right mainstem bronchus because of enlarged lymph node and a mass. Stage is T4 N1 M0 tumor stage IIIa diagnosis in January of 2016 2.started on radiation and chemotherapy from October 21, 2014 3.finished 6 cycles of carboplatinum and Taxol  in March  29 th of 2016, As finished concurrent chemoradiation therapy and PET scan shows significant response 4. started on consolidation chemotherapy with carboplatinum and Taxol 2 cycle        Carcinoma of right lung   01/03/2015 Initial Diagnosis Carcinoma of right lung    Epidermoid carcinoma of lung   10/04/2014 Initial Diagnosis Epidermoid carcinoma of lung    Oncology Flowsheet 01/29/2015 02/05/2015 02/26/2015  Day, Cycle Day 1, Cycle 1 Day 1, Cycle 2 Day 1, Cycle 1  CARBOplatin (PARAPLATIN) IV 300 mg - 390 mg  dexamethasone (DECADRON) IV [ 12 mg ] [ 20 mg ] [ 12 mg ]  fosaprepitant (EMEND) IV [ 150 mg ] - [ 150 mg ]  ondansetron (ZOFRAN) IV - [ 16 mg ] -  PACLitaxel (TAXOL) IV 80 mg/m2 80 mg/m2 144 mg  palonosetron (ALOXI) IV 0.25 mg - 0.25 mg    INTERVAL HISTORY:  79 year old lady came today for initiating second cycle of chemotherapy however patient is myelosuppressive.  No chills.  No fever.  No tingling numbness.   Alopecia no evidence of stomatitis.  No cough or hemoptysis or chest pain REVIEW OF SYSTEMS:    general status: Patient is feeling weak and tired.  No change in a performance status.  No chills.  No  fever. HEENT: Alopecia.  No evidence of stomatitis Lungs: No cough or shortness of breath Cardiac: No chest pain or paroxysmal nocturnal dyspnea GI: No nausea no vomiting no diarrhea no abdominal pain Skin: No rash Lower extremity no swelling Neurological system: No tingling.  No numbness.  No other focal signs Musculoskeletal system no bony pains  PAST MEDICAL HISTORY: Past Medical History  Diagnosis Date  . HTN (hypertension)   . Pacemaker   . Coronary artery disease   . HLD (hyperlipidemia)   . CAD (coronary artery disease) unknown  . Lung cancer   . History of colonoscopy 2013  . History of mammography, screening 2015  . History of Papanicolaou smear of cervix 2013  . Carcinoma of right lung 01/03/2015    PAST SURGICAL HISTORY: Past Surgical History  Procedure Laterality Date  . Appendectomy    . Vaginal hysterectomy    . Cardiac bypass    . Pacemaker insertion N/A     FAMILY HISTORY Family History  Problem Relation Age of Onset  . COPD Mother     sister, and brother    ADVANCED DIRECTIVES:  Patient does not have living will HEALTH MAINTENANCE: History  Substance Use Topics  . Smoking status: Former Smoker -- 2.00 packs/day for 42 years    Types: Cigarettes    Quit date: 08/30/2000  . Smokeless tobacco: Not on file  Comment: quit smoking in 08/28/2000  . Alcohol Use: 6.0 oz/week    10 Glasses of wine per week     Comment: 2 glasses of wine per day      Allergies  Allergen Reactions  . Lovenox [Enoxaparin Sodium] Itching  . Meperidine Other (See Comments)    Other Reaction: pt does not like how it makes her feel  . Enoxaparin Rash    Other Reaction: OTHER REACTION    Current Outpatient Prescriptions  Medication Sig Dispense Refill  . aspirin 81 MG tablet Take 81 mg by mouth daily.    Marland Kitchen atenolol (TENORMIN) 25 MG tablet Take 25 mg by mouth daily.    Marland Kitchen atorvastatin (LIPITOR) 20 MG tablet Take 1 tablet by mouth daily.    Marland Kitchen azelastine (ASTELIN)  0.1 % nasal spray Place 2 sprays into both nostrils 2 (two) times daily. Use in each nostril as directed    . calcium carbonate (OS-CAL) 600 MG TABS tablet Take 600 mg by mouth 2 (two) times daily with a meal.    . [employer] ALLERGY RELIEF 180 MG tablet Take 180 mg by mouth daily.  5  . ELIQUIS 5 MG TABS tablet Take 1 tablet by mouth 2 (two) times daily.   11  . hydrochlorothiazide (MICROZIDE) 12.5 MG capsule Take 12.5 mg by mouth daily.    Marland Kitchen levothyroxine (SYNTHROID, LEVOTHROID) 75 MCG tablet Take 75 mcg by mouth daily before breakfast.    . losartan (COZAAR) 25 MG tablet Take 25 mg by mouth daily.    . Multiple Vitamin (MULTI-VITAMINS) TABS Take 1 tablet by mouth daily.    Marland Kitchen atorvastatin (LIPITOR) 20 MG tablet Take by mouth.    Marland Kitchen HYDROcodone-acetaminophen (NORCO/VICODIN) 5-325 MG per tablet Take 1 tablet by mouth every 6 (six) hours as needed (for cough). 30 tablet 0  . ipratropium-albuterol (DUONEB) 0.5-2.5 (3) MG/3ML SOLN Inhale 3 mLs into the lungs 2 (two) times daily.  3  . losartan (COZAAR) 25 MG tablet Take 1 tablet by mouth daily.    . Multiple Vitamin (MULTIVITAMIN) tablet Take 1 tablet by mouth daily.    Marland Kitchen omeprazole (PRILOSEC) 20 MG capsule Take 20 mg by mouth daily.    Marland Kitchen PRILOSEC OTC 20 MG tablet Take 2 tablets by mouth daily before supper. 30 minutes before supper  12   No current facility-administered medications for this visit.   Facility-Administered Medications Ordered in Other Visits  Medication Dose Route Frequency Provider Last Rate Last Dose  . sodium chloride 0.9 % injection 10 mL  10 mL Intravenous PRN Forest Gleason, MD   10 mL at 02/19/15 1000    OBJECTIVE:  Filed Vitals:   02/19/15 1117  BP: 130/83  Pulse: 88  Temp: 98.8 F (37.1 C)     Body mass index is 25.2 kg/(m^2).    ECOG FS:1 - Symptomatic but completely ambulatory  PHYSICAL EXAM: GENERAL:  Well developed, well nourished, sitting comfortably in the exam room in no acute distress. MENTAL STATUS:  Alert and  oriented to person, place and time. HEAD:alopecia,   Normocephalic, atraumatic, face symmetric, no Cushingoid features. EYES.  Pupils equal round and reactive to light and accomodation.  No conjunctivitis or scleral icterus. ENT:  Oropharynx clear without lesion.  Tongue normal. Mucous membranes moist.  RESPIRATORY:  Clear to auscultation without rales, wheezes or rhonchi. CARDIOVASCULAR:  Regular rate and rhythm without murmur, rub or gallop. BREAST:  Right breast without masses, skin changes or nipple discharge.  Left breast  without masses, skin changes or nipple discharge. ABDOMEN:  Soft, non-tender, with active bowel sounds, and no hepatosplenomegaly.  No masses. BACK:  No CVA tenderness.  No tenderness on percussion of the back or rib cage. SKIN:  No rashes, ulcers or lesions. EXTREMITIES: No edema, no skin discoloration or tenderness.  No palpable cords. LYMPH NODES: No palpable cervical, supraclavicular, axillary or inguinal adenopathy  NEUROLOGICAL: Unremarkable. PSYCH:  Appropriate.   LAB RESULTS:  Infusion on 02/19/2015  Component Date Value Ref Range Status  . WBC 02/19/2015 2.1* 3.6 - 11.0 K/uL Final   A-LINE DRAW  . RBC 02/19/2015 3.75* 3.80 - 5.20 MIL/uL Final  . Hemoglobin 02/19/2015 11.5* 12.0 - 16.0 g/dL Final  . HCT 02/19/2015 35.4  35.0 - 47.0 % Final  . MCV 02/19/2015 94.3  80.0 - 100.0 fL Final  . MCH 02/19/2015 30.6  26.0 - 34.0 pg Final  . MCHC 02/19/2015 32.4  32.0 - 36.0 g/dL Final  . RDW 02/19/2015 15.3* 11.5 - 14.5 % Final  . Platelets 02/19/2015 171  150 - 440 K/uL Final  . Neutrophils Relative % 02/19/2015 45   Final  . Neutro Abs 02/19/2015 0.9* 1.4 - 6.5 K/uL Final  . Lymphocytes Relative 02/19/2015 27   Final  . Lymphs Abs 02/19/2015 0.6* 1.0 - 3.6 K/uL Final  . Monocytes Relative 02/19/2015 25   Final  . Monocytes Absolute 02/19/2015 0.5  0.2 - 0.9 K/uL Final  . Eosinophils Relative 02/19/2015 2   Final  . Eosinophils Absolute 02/19/2015 0.1  0 -  0.7 K/uL Final  . Basophils Relative 02/19/2015 1   Final  . Basophils Absolute 02/19/2015 0.0  0 - 0.1 K/uL Final  . Sodium 02/19/2015 137  135 - 145 mmol/L Final  . Potassium 02/19/2015 3.7  3.5 - 5.1 mmol/L Final  . Chloride 02/19/2015 102  101 - 111 mmol/L Final  . CO2 02/19/2015 30  22 - 32 mmol/L Final  . Glucose, Bld 02/19/2015 107* 65 - 99 mg/dL Final  . BUN 02/19/2015 17  6 - 20 mg/dL Final  . Creatinine, Ser 02/19/2015 0.83  0.44 - 1.00 mg/dL Final  . Calcium 02/19/2015 8.5* 8.9 - 10.3 mg/dL Final  . Total Protein 02/19/2015 6.9  6.5 - 8.1 g/dL Final  . Albumin 02/19/2015 3.7  3.5 - 5.0 g/dL Final  . AST 02/19/2015 26  15 - 41 U/L Final  . ALT 02/19/2015 17  14 - 54 U/L Final  . Alkaline Phosphatase 02/19/2015 61  38 - 126 U/L Final  . Total Bilirubin 02/19/2015 0.5  0.3 - 1.2 mg/dL Final  . GFR calc non Af Amer 02/19/2015 >60  >60 mL/min Final  . GFR calc Af Amer 02/19/2015 >60  >60 mL/min Final   Comment: (NOTE) The eGFR has been calculated using the CKD EPI equation. This calculation has not been validated in all clinical situations. eGFR's persistently <60 mL/min signify possible Chronic Kidney Disease.   . Anion gap 02/19/2015 5  5 - 15 Final       ASSESSMENT: myelo suppression without fevers secondary to chemotherapy Carcinoma of lung stage III status post chemoradiation therapy on consultation therapy  MEDICAL DECISION MAKING:  Patient was advised to call me if spikes I fever or chills Hold chemotherapy Reevaluate patient next week for consideration of second cycle of chemotherapy blood count improves Patient expressed understanding and was in agreement with this plan. She also understands that She can call clinic at any time with any  questions, concerns, or complaints.    Carcinoma of right lung   Staging form: Lung, AJCC 7th Edition     Clinical: Stage IIIA (T4, N1, M0) - Unsigned   Forest Gleason, MD   03/04/2015 8:20 AM

## 2015-03-05 ENCOUNTER — Inpatient Hospital Stay: Payer: Medicare Other | Attending: Oncology

## 2015-03-05 ENCOUNTER — Inpatient Hospital Stay: Payer: Medicare Other

## 2015-03-05 ENCOUNTER — Inpatient Hospital Stay (HOSPITAL_BASED_OUTPATIENT_CLINIC_OR_DEPARTMENT_OTHER): Payer: Medicare Other | Admitting: Oncology

## 2015-03-05 ENCOUNTER — Encounter: Payer: Self-pay | Admitting: Oncology

## 2015-03-05 VITALS — BP 100/63 | HR 90 | Temp 96.7°F | Wt 159.8 lb

## 2015-03-05 DIAGNOSIS — E785 Hyperlipidemia, unspecified: Secondary | ICD-10-CM | POA: Insufficient documentation

## 2015-03-05 DIAGNOSIS — R05 Cough: Secondary | ICD-10-CM

## 2015-03-05 DIAGNOSIS — C3491 Malignant neoplasm of unspecified part of right bronchus or lung: Secondary | ICD-10-CM

## 2015-03-05 DIAGNOSIS — Z5111 Encounter for antineoplastic chemotherapy: Secondary | ICD-10-CM | POA: Diagnosis not present

## 2015-03-05 DIAGNOSIS — Z87891 Personal history of nicotine dependence: Secondary | ICD-10-CM | POA: Diagnosis not present

## 2015-03-05 DIAGNOSIS — C3401 Malignant neoplasm of right main bronchus: Secondary | ICD-10-CM | POA: Insufficient documentation

## 2015-03-05 DIAGNOSIS — Z9221 Personal history of antineoplastic chemotherapy: Secondary | ICD-10-CM

## 2015-03-05 DIAGNOSIS — Z7982 Long term (current) use of aspirin: Secondary | ICD-10-CM | POA: Diagnosis not present

## 2015-03-05 DIAGNOSIS — I2510 Atherosclerotic heart disease of native coronary artery without angina pectoris: Secondary | ICD-10-CM | POA: Diagnosis not present

## 2015-03-05 DIAGNOSIS — G620 Drug-induced polyneuropathy: Secondary | ICD-10-CM | POA: Diagnosis not present

## 2015-03-05 DIAGNOSIS — R0600 Dyspnea, unspecified: Secondary | ICD-10-CM

## 2015-03-05 DIAGNOSIS — Z923 Personal history of irradiation: Secondary | ICD-10-CM

## 2015-03-05 DIAGNOSIS — Z79899 Other long term (current) drug therapy: Secondary | ICD-10-CM | POA: Diagnosis not present

## 2015-03-05 DIAGNOSIS — T451X5S Adverse effect of antineoplastic and immunosuppressive drugs, sequela: Secondary | ICD-10-CM | POA: Diagnosis not present

## 2015-03-05 DIAGNOSIS — Z950 Presence of cardiac pacemaker: Secondary | ICD-10-CM

## 2015-03-05 DIAGNOSIS — I10 Essential (primary) hypertension: Secondary | ICD-10-CM | POA: Diagnosis not present

## 2015-03-05 DIAGNOSIS — R059 Cough, unspecified: Secondary | ICD-10-CM

## 2015-03-05 DIAGNOSIS — G64 Other disorders of peripheral nervous system: Secondary | ICD-10-CM

## 2015-03-05 DIAGNOSIS — E079 Disorder of thyroid, unspecified: Secondary | ICD-10-CM

## 2015-03-05 DIAGNOSIS — I459 Conduction disorder, unspecified: Secondary | ICD-10-CM

## 2015-03-05 DIAGNOSIS — C801 Malignant (primary) neoplasm, unspecified: Secondary | ICD-10-CM | POA: Insufficient documentation

## 2015-03-05 LAB — CBC WITH DIFFERENTIAL/PLATELET
BASOS ABS: 0 10*3/uL (ref 0–0.1)
BASOS PCT: 1 %
EOS ABS: 0.2 10*3/uL (ref 0–0.7)
Eosinophils Relative: 7 %
HEMATOCRIT: 34.5 % — AB (ref 35.0–47.0)
HEMOGLOBIN: 11.4 g/dL — AB (ref 12.0–16.0)
Lymphocytes Relative: 34 %
Lymphs Abs: 1 10*3/uL (ref 1.0–3.6)
MCH: 30.9 pg (ref 26.0–34.0)
MCHC: 33.1 g/dL (ref 32.0–36.0)
MCV: 93.4 fL (ref 80.0–100.0)
Monocytes Absolute: 0.2 10*3/uL (ref 0.2–0.9)
Monocytes Relative: 7 %
Neutro Abs: 1.5 10*3/uL (ref 1.4–6.5)
Neutrophils Relative %: 51 %
Platelets: 224 10*3/uL (ref 150–440)
RBC: 3.7 MIL/uL — ABNORMAL LOW (ref 3.80–5.20)
RDW: 15 % — AB (ref 11.5–14.5)
WBC: 2.9 10*3/uL — ABNORMAL LOW (ref 3.6–11.0)

## 2015-03-05 MED ORDER — DEXAMETHASONE SODIUM PHOSPHATE 100 MG/10ML IJ SOLN
Freq: Once | INTRAMUSCULAR | Status: AC
  Administered 2015-03-05: 17:00:00 via INTRAVENOUS
  Filled 2015-03-05: qty 8

## 2015-03-05 MED ORDER — PACLITAXEL CHEMO INJECTION 300 MG/50ML
144.0000 mg | Freq: Once | INTRAVENOUS | Status: AC
  Administered 2015-03-05: 144 mg via INTRAVENOUS
  Filled 2015-03-05: qty 24

## 2015-03-05 MED ORDER — SODIUM CHLORIDE 0.9 % IJ SOLN
10.0000 mL | INTRAMUSCULAR | Status: DC | PRN
  Administered 2015-03-05: 10 mL via INTRAVENOUS
  Filled 2015-03-05: qty 10

## 2015-03-05 MED ORDER — SODIUM CHLORIDE 0.9 % IV SOLN
Freq: Once | INTRAVENOUS | Status: AC
  Administered 2015-03-05: 16:00:00 via INTRAVENOUS
  Filled 2015-03-05: qty 1000

## 2015-03-05 MED ORDER — DIPHENHYDRAMINE HCL 50 MG/ML IJ SOLN
50.0000 mg | Freq: Once | INTRAMUSCULAR | Status: AC
  Administered 2015-03-05: 50 mg via INTRAVENOUS
  Filled 2015-03-05: qty 1

## 2015-03-05 MED ORDER — FAMOTIDINE IN NACL 20-0.9 MG/50ML-% IV SOLN
20.0000 mg | Freq: Once | INTRAVENOUS | Status: AC
  Administered 2015-03-05: 20 mg via INTRAVENOUS
  Filled 2015-03-05: qty 50

## 2015-03-05 MED ORDER — HEPARIN SOD (PORK) LOCK FLUSH 100 UNIT/ML IV SOLN
500.0000 [IU] | Freq: Once | INTRAVENOUS | Status: AC
  Administered 2015-03-05: 500 [IU] via INTRAVENOUS
  Filled 2015-03-05: qty 5

## 2015-03-05 NOTE — Progress Notes (Signed)
[facility] @ [facility] Telephone:(336) [phone]  Fax:(336) [phone]     Jenny Cooper OB: 07/28/1936  MR#: [id number]  SPQ#:[id number]  Patient Care Team: Gayland Curry, MD as PCP - General (Family Medicine)  CHIEF COMPLAINT:  Chief Complaint  Patient presents with  . 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   Patient had compression of the right mainstem bronchus because of enlarged lymph node and a mass. Stage is T4 N1 M0 tumor stage IIIa diagnosis in January of 2016 2.started on radiation and chemotherapy from October 21, 2014 3.finished 6 cycles of carboplatinum and Taxol  in March  29 th of 2016, As finished concurrent chemoradiation therapy and PET scan shows significant response 4. started on consolidation chemotherapy with carboplatinum and Taxol 2 cycle   Patient finished 2 cycles of consultation therapy on March 06, 2015     Carcinoma of right lung    Epidermoid carcinoma of lung   10/04/2014 Initial Diagnosis Epidermoid carcinoma of lung    Oncology Flowsheet 01/29/2015 02/05/2015 02/26/2015 03/05/2015  Day, Cycle Day 1, Cycle 1 Day 1, Cycle 2 Day 1, Cycle 1 Day 1, Cycle 1  CARBOplatin (PARAPLATIN) IV 300 mg - 390 mg -  dexamethasone (DECADRON) IV [ 12 mg ] [ 20 mg ] [ 12 mg ] [ 20 mg ]  fosaprepitant (EMEND) IV [ 150 mg ] - [ 150 mg ] -  ondansetron (ZOFRAN) IV - [ 16 mg ] - [ 16 mg ]  PACLitaxel (TAXOL) IV 80 mg/m2 80 mg/m2 144 mg 144 mg  palonosetron (ALOXI) IV 0.25 mg - 0.25 mg -    INTERVAL HISTORY: 79 year old lady was finished chemoradiation therapy came today further follow-up regarding carcinoma of lung.  Patient has repeat PET scan which shows significant improvement.  Patient did not have any significant side effect of chemotherapy here to discuss further treatment planning.   Cough is improved. January 29, 2015 Patient is here to discuss consolidation chemotherapy.  Overall feeling well.  No chills.  No fever.  She hadnumber of questions which were answered February 26, 2015 Patient is here to initiate second cycle of chemotherapy.  Tolerating treatment very well.  No chills.  No fever.  No nausea.  No vomiting.  No diarrhea.  At his grade 1 neuropathy July 6 th 2016 Patient is here for day   2 nd  chemotherapy second cycle.  No chills no fever.  No nausea.  No vomiting. Tingling numbness persists. REVIEW OF SYSTEMS:    general status: Patient is feeling weak and tired.  No change in a performance status.  No chills.  No fever. HEENT: Alopecia.  No evidence of stomatitis Lungs: No cough or shortness of breath Cardiac: No chest pain or paroxysmal nocturnal dyspnea GI: No nausea no vomiting no diarrhea no abdominal pain Skin: No rash Lower extremity no swelling Neurological system: Grade 1 neuropathy  No other focal signs  Musculoskeletal system no bony pains  As per HPI. Otherwise, a complete review of systems is negatve.  PAST MEDICAL HISTORY: Past Medical History  Diagnosis Date  . HTN (hypertension)   . Pacemaker   . Coronary artery disease   . HLD (hyperlipidemia)   . CAD (coronary artery disease) unknown  . Lung cancer   . History of colonoscopy  2013  . History of mammography, screening 2015  . History of Papanicolaou smear of cervix 2013  . Carcinoma of right lung 01/03/2015    PAST SURGICAL HISTORY: Past Surgical History  Procedure Laterality Date  . Appendectomy    . Vaginal hysterectomy    . Cardiac bypass    . Pacemaker insertion N/A     FAMILY HISTORY Family History  Problem Relation Age of Onset  . COPD Mother     sister, and brother        ADVANCED DIRECTIVES:Patient does not have any advanced healthcare directive. Information has been given. HEALTH MAINTENANCE: History  Substance Use Topics  . Smoking status: Former Smoker  -- 2.00 packs/day for 42 years    Types: Cigarettes    Quit date: 08/30/2000  . Smokeless tobacco: Not on file     Comment: quit smoking in 08/28/2000  . Alcohol Use: 6.0 oz/week    10 Glasses of wine per week     Comment: 2 glasses of wine per day     Allergies  Allergen Reactions  . Lovenox [Enoxaparin Sodium] Itching  . Meperidine Other (See Comments)    Other Reaction: pt does not like how it makes her feel  . Enoxaparin Rash    Other Reaction: OTHER REACTION    Current Outpatient Prescriptions  Medication Sig Dispense Refill  . aspirin 81 MG tablet Take 81 mg by mouth daily.    Marland Kitchen atenolol (TENORMIN) 25 MG tablet Take 25 mg by mouth daily.    Marland Kitchen atorvastatin (LIPITOR) 20 MG tablet Take by mouth.    Marland Kitchen azelastine (ASTELIN) 0.1 % nasal spray Place 2 sprays into both nostrils 2 (two) times daily. Use in each nostril as directed    . calcium carbonate (OS-CAL) 600 MG TABS tablet Take 600 mg by mouth 2 (two) times daily with a meal.    . [employer] ALLERGY RELIEF 180 MG tablet Take 180 mg by mouth daily.  5  . ELIQUIS 5 MG TABS tablet Take 1 tablet by mouth 2 (two) times daily.   11  . hydrochlorothiazide (MICROZIDE) 12.5 MG capsule Take 12.5 mg by mouth daily.    Marland Kitchen HYDROcodone-acetaminophen (NORCO/VICODIN) 5-325 MG per tablet Take 1 tablet by mouth every 6 (six) hours as needed (for cough). 30 tablet 0  . ipratropium-albuterol (DUONEB) 0.5-2.5 (3) MG/3ML SOLN Inhale 3 mLs into the lungs 2 (two) times daily.  3  . levothyroxine (SYNTHROID, LEVOTHROID) 75 MCG tablet Take 75 mcg by mouth daily before breakfast.    . losartan (COZAAR) 25 MG tablet Take 25 mg by mouth daily.    Marland Kitchen losartan (COZAAR) 25 MG tablet Take 1 tablet by mouth daily.    . Multiple Vitamin (MULTI-VITAMINS) TABS Take 1 tablet by mouth daily.    . Multiple Vitamin (MULTIVITAMIN) tablet Take 1 tablet by mouth daily.    Marland Kitchen omeprazole (PRILOSEC) 20 MG capsule Take 20 mg by mouth daily.    Marland Kitchen PRILOSEC OTC 20 MG tablet Take 2  tablets by mouth daily before supper. 30 minutes before supper  12  . atorvastatin (LIPITOR) 20 MG tablet Take 1 tablet by mouth daily.     No current facility-administered medications for this visit.   Facility-Administered Medications Ordered in Other Visits  Medication Dose Route Frequency Provider Last Rate Last Dose  . heparin lock flush 100 unit/mL  500 Units Intravenous Once Forest Gleason, MD      . PACLitaxel (TAXOL) 144 mg in dextrose 5 %  250 mL chemo infusion (</= '80mg'$ /m2)  144 mg Intravenous Once Forest Gleason, MD 274 mL/hr at 03/05/15 1641 144 mg at 03/05/15 1641  . sodium chloride 0.9 % injection 10 mL  10 mL Intravenous PRN Forest Gleason, MD   10 mL at 02/19/15 1000  . sodium chloride 0.9 % injection 10 mL  10 mL Intravenous PRN Forest Gleason, MD   10 mL at 03/05/15 1351    OBJECTIVE:  Filed Vitals:   03/05/15 1441  BP: 100/63  Pulse: 90  Temp: 96.7 F (35.9 C)     Body mass index is 25.03 kg/(m^2).    ECOG FS:1 - Symptomatic but completely ambulatory  PHYSICAL EXAM: GENERAL:  Well developed, well nourished, sitting comfortably in the exam room in no acute distress. MENTAL STATUS:  Alert and oriented to person, place and time. HEAD: alopecia Normocephalic, atraumatic, face symmetric, no Cushingoid features. EYES: eyes.  Pupils equal round and reactive to light and accomodation.  No conjunctivitis or scleral icterus. ENT:  Oropharynx clear without lesion.  Tongue normal. Mucous membranes moist.  RESPIRATORY:  Clear to auscultation without rales, wheezes or rhonchi. CARDIOVASCULAR:  Regular rate and rhythm without murmur, rub or gallop. . ABDOMEN:  Soft, non-tender, with active bowel sounds, and no hepatosplenomegaly.  No masses. BACK:  No CVA tenderness.  No tenderness on percussion of the back or rib cage. SKIN:  No rashes, ulcers or lesions. EXTREMITIES: No edema, no skin discoloration or tenderness.  No palpable cords. LYMPH NODES: No palpable cervical,  supraclavicular, axillary or inguinal adenopathy  NEUROLOGICAL: Unremarkable. PSYCH:  Appropriate.   LAB RESULTS:  Infusion on 03/05/2015  Component Date Value Ref Range Status  . WBC 03/05/2015 2.9* 3.6 - 11.0 K/uL Final   A-LINE DRAW  . RBC 03/05/2015 3.70* 3.80 - 5.20 MIL/uL Final  . Hemoglobin 03/05/2015 11.4* 12.0 - 16.0 g/dL Final  . HCT 03/05/2015 34.5* 35.0 - 47.0 % Final  . MCV 03/05/2015 93.4  80.0 - 100.0 fL Final  . MCH 03/05/2015 30.9  26.0 - 34.0 pg Final  . MCHC 03/05/2015 33.1  32.0 - 36.0 g/dL Final  . RDW 03/05/2015 15.0* 11.5 - 14.5 % Final  . Platelets 03/05/2015 224  150 - 440 K/uL Final  . Neutrophils Relative % 03/05/2015 51   Final  . Neutro Abs 03/05/2015 1.5  1.4 - 6.5 K/uL Final  . Lymphocytes Relative 03/05/2015 34   Final  . Lymphs Abs 03/05/2015 1.0  1.0 - 3.6 K/uL Final  . Monocytes Relative 03/05/2015 7   Final  . Monocytes Absolute 03/05/2015 0.2  0.2 - 0.9 K/uL Final  . Eosinophils Relative 03/05/2015 7   Final  . Eosinophils Absolute 03/05/2015 0.2  0 - 0.7 K/uL Final  . Basophils Relative 03/05/2015 1   Final  . Basophils Absolute 03/05/2015 0.0  0 - 0.1 K/uL Final       Assessment and plan Proceed with second cycle of chemotherapy Stage III carcinoma of lung with good response to initial induction chemotherapy and radiation therapy Will finish consultation chemotherapy with day 8 Taxol chemotherapy. All lab data has been reviewed. The side effect was  grade 1 neuropathy Patient would be evaluated by our survivorship clinic.  Plan would be to repeat PET scan in next few months and follow patient accordingly   To nccn guide lines 1.  History physical and chest CT scan with contrast 6-12 months for 2 years then history physical and noncontrast enhanced CT scan annually 2.  Smoking cesarean assessment each visit 3.  Immunization with influenza vaccine annually.  Herpes zoster vaccine.  And pneumococcal vaccination with appropriate  revaccination 4.  Maintain healthy weight and of physical activity consume healthy diet limit consumption of alcohol Routine blood pressure checkup cholesterol checkup and glucose monitoring.   Forest Gleason, MD   03/05/2015 5:25 PM

## 2015-03-05 NOTE — Progress Notes (Signed)
Patient does not have living will.  Former smoker. 

## 2015-04-01 ENCOUNTER — Encounter: Payer: Self-pay | Admitting: *Deleted

## 2015-04-01 ENCOUNTER — Inpatient Hospital Stay (HOSPITAL_COMMUNITY)
Admit: 2015-04-01 | Discharge: 2015-04-01 | Disposition: A | Discharge status: Home / Self Care | Payer: Medicare Other | Attending: Internal Medicine | Admitting: Internal Medicine

## 2015-04-01 ENCOUNTER — Inpatient Hospital Stay
Admission: EM | Admit: 2015-04-01 | Discharge: 2015-04-07 | DRG: 871 | Disposition: A | Discharge status: Home Health | Payer: Medicare Other | Attending: Internal Medicine | Admitting: Internal Medicine

## 2015-04-01 ENCOUNTER — Emergency Department: Payer: Medicare Other

## 2015-04-01 DIAGNOSIS — Z9861 Coronary angioplasty status: Secondary | ICD-10-CM | POA: Diagnosis not present

## 2015-04-01 DIAGNOSIS — J449 Chronic obstructive pulmonary disease, unspecified: Secondary | ICD-10-CM | POA: Diagnosis present

## 2015-04-01 DIAGNOSIS — Z86718 Personal history of other venous thrombosis and embolism: Secondary | ICD-10-CM

## 2015-04-01 DIAGNOSIS — I25810 Atherosclerosis of coronary artery bypass graft(s) without angina pectoris: Secondary | ICD-10-CM | POA: Diagnosis present

## 2015-04-01 DIAGNOSIS — I429 Cardiomyopathy, unspecified: Secondary | ICD-10-CM | POA: Diagnosis present

## 2015-04-01 DIAGNOSIS — E039 Hypothyroidism, unspecified: Secondary | ICD-10-CM | POA: Diagnosis present

## 2015-04-01 DIAGNOSIS — IMO0001 Reserved for inherently not codable concepts without codable children: Secondary | ICD-10-CM | POA: Insufficient documentation

## 2015-04-01 DIAGNOSIS — I481 Persistent atrial fibrillation: Secondary | ICD-10-CM | POA: Diagnosis not present

## 2015-04-01 DIAGNOSIS — Z79899 Other long term (current) drug therapy: Secondary | ICD-10-CM | POA: Diagnosis not present

## 2015-04-01 DIAGNOSIS — I10 Essential (primary) hypertension: Secondary | ICD-10-CM | POA: Diagnosis present

## 2015-04-01 DIAGNOSIS — Z7901 Long term (current) use of anticoagulants: Secondary | ICD-10-CM

## 2015-04-01 DIAGNOSIS — Z885 Allergy status to narcotic agent status: Secondary | ICD-10-CM | POA: Diagnosis not present

## 2015-04-01 DIAGNOSIS — E785 Hyperlipidemia, unspecified: Secondary | ICD-10-CM | POA: Diagnosis present

## 2015-04-01 DIAGNOSIS — R0602 Shortness of breath: Secondary | ICD-10-CM

## 2015-04-01 DIAGNOSIS — J9602 Acute respiratory failure with hypercapnia: Secondary | ICD-10-CM | POA: Diagnosis present

## 2015-04-01 DIAGNOSIS — I252 Old myocardial infarction: Secondary | ICD-10-CM

## 2015-04-01 DIAGNOSIS — I471 Supraventricular tachycardia: Secondary | ICD-10-CM | POA: Diagnosis present

## 2015-04-01 DIAGNOSIS — C3491 Malignant neoplasm of unspecified part of right bronchus or lung: Secondary | ICD-10-CM | POA: Diagnosis present

## 2015-04-01 DIAGNOSIS — T7840XA Allergy, unspecified, initial encounter: Secondary | ICD-10-CM | POA: Diagnosis not present

## 2015-04-01 DIAGNOSIS — J9601 Acute respiratory failure with hypoxia: Secondary | ICD-10-CM | POA: Diagnosis present

## 2015-04-01 DIAGNOSIS — I480 Paroxysmal atrial fibrillation: Secondary | ICD-10-CM | POA: Diagnosis present

## 2015-04-01 DIAGNOSIS — J189 Pneumonia, unspecified organism: Secondary | ICD-10-CM | POA: Diagnosis present

## 2015-04-01 DIAGNOSIS — F419 Anxiety disorder, unspecified: Secondary | ICD-10-CM | POA: Diagnosis present

## 2015-04-01 DIAGNOSIS — Z888 Allergy status to other drugs, medicaments and biological substances status: Secondary | ICD-10-CM

## 2015-04-01 DIAGNOSIS — Z951 Presence of aortocoronary bypass graft: Secondary | ICD-10-CM

## 2015-04-01 DIAGNOSIS — Z87891 Personal history of nicotine dependence: Secondary | ICD-10-CM | POA: Diagnosis not present

## 2015-04-01 DIAGNOSIS — I248 Other forms of acute ischemic heart disease: Secondary | ICD-10-CM | POA: Diagnosis present

## 2015-04-01 DIAGNOSIS — C3401 Malignant neoplasm of right main bronchus: Secondary | ICD-10-CM | POA: Diagnosis not present

## 2015-04-01 DIAGNOSIS — I214 Non-ST elevation (NSTEMI) myocardial infarction: Secondary | ICD-10-CM | POA: Insufficient documentation

## 2015-04-01 DIAGNOSIS — I340 Nonrheumatic mitral (valve) insufficiency: Secondary | ICD-10-CM | POA: Diagnosis not present

## 2015-04-01 DIAGNOSIS — Z952 Presence of prosthetic heart valve: Secondary | ICD-10-CM

## 2015-04-01 DIAGNOSIS — A419 Sepsis, unspecified organism: Secondary | ICD-10-CM | POA: Diagnosis present

## 2015-04-01 DIAGNOSIS — Z7982 Long term (current) use of aspirin: Secondary | ICD-10-CM | POA: Diagnosis not present

## 2015-04-01 DIAGNOSIS — G64 Other disorders of peripheral nervous system: Secondary | ICD-10-CM | POA: Diagnosis present

## 2015-04-01 DIAGNOSIS — I482 Chronic atrial fibrillation: Secondary | ICD-10-CM | POA: Diagnosis not present

## 2015-04-01 DIAGNOSIS — Z950 Presence of cardiac pacemaker: Secondary | ICD-10-CM

## 2015-04-01 HISTORY — DX: Personal history of other venous thrombosis and embolism: Z86.718

## 2015-04-01 HISTORY — DX: Chronic obstructive pulmonary disease, unspecified: J44.9

## 2015-04-01 HISTORY — DX: Nonrheumatic mitral (valve) insufficiency: I34.0

## 2015-04-01 HISTORY — DX: Paroxysmal atrial fibrillation: I48.0

## 2015-04-01 HISTORY — DX: Atypical atrial flutter: I48.4

## 2015-04-01 LAB — COMPREHENSIVE METABOLIC PANEL
ALT: 16 U/L (ref 14–54)
ANION GAP: 13 (ref 5–15)
AST: 30 U/L (ref 15–41)
Albumin: 4 g/dL (ref 3.5–5.0)
Alkaline Phosphatase: 72 U/L (ref 38–126)
BUN: 22 mg/dL — AB (ref 6–20)
CALCIUM: 9 mg/dL (ref 8.9–10.3)
CHLORIDE: 98 mmol/L — AB (ref 101–111)
CO2: 28 mmol/L (ref 22–32)
Creatinine, Ser: 1.11 mg/dL — ABNORMAL HIGH (ref 0.44–1.00)
GFR calc Af Amer: 54 mL/min — ABNORMAL LOW (ref >60)
GFR, EST NON AFRICAN AMERICAN: 47 mL/min — AB (ref >60)
Glucose, Bld: 327 mg/dL — ABNORMAL HIGH (ref 65–99)
Potassium: 3.8 mmol/L (ref 3.5–5.1)
Sodium: 139 mmol/L (ref 135–145)
Total Bilirubin: 0.6 mg/dL (ref 0.3–1.2)
Total Protein: 7.4 g/dL (ref 6.5–8.1)

## 2015-04-01 LAB — BLOOD GAS, VENOUS
ACID-BASE EXCESS: 1 mmol/L (ref 0.0–3.0)
Acid-base deficit: 2.8 mmol/L — ABNORMAL HIGH (ref 0.0–2.0)
Bicarbonate: 31.7 mEq/L — ABNORMAL HIGH (ref 21.0–28.0)
Bicarbonate: 24.1 mEq/L (ref 21.0–28.0)
FIO2: 1
FIO2: 0.75
MODE: POSITIVE
PATIENT TEMPERATURE: 37
PCO2 VEN: 49 mmHg (ref 44.0–60.0)
PEEP/CPAP: 5 cmH2O
PO2 VEN: 19 mmHg — AB (ref 30.0–45.0)
Patient temperature: 37
RATE: 8 resp/min
pCO2, Ven: 83 mmHg (ref 44.0–60.0)
pH, Ven: 7.19 — CL (ref 7.320–7.430)
pH, Ven: 7.3 — ABNORMAL LOW (ref 7.320–7.430)

## 2015-04-01 LAB — CBC WITH DIFFERENTIAL/PLATELET
BASOS PCT: 0 %
Basophils Absolute: 0 10*3/uL (ref 0–0.1)
Eosinophils Absolute: 0.3 10*3/uL (ref 0–0.7)
Eosinophils Relative: 3 %
HEMATOCRIT: 38.1 % (ref 35.0–47.0)
Hemoglobin: 12.2 g/dL (ref 12.0–16.0)
LYMPHS PCT: 16 %
Lymphs Abs: 1.6 10*3/uL (ref 1.0–3.6)
MCH: 30.5 pg (ref 26.0–34.0)
MCHC: 32.1 g/dL (ref 32.0–36.0)
MCV: 94.9 fL (ref 80.0–100.0)
MONO ABS: 1 10*3/uL — AB (ref 0.2–0.9)
Monocytes Relative: 10 %
Neutro Abs: 7.1 10*3/uL — ABNORMAL HIGH (ref 1.4–6.5)
Neutrophils Relative %: 71 %
Platelets: 296 10*3/uL (ref 150–440)
RBC: 4.01 MIL/uL (ref 3.80–5.20)
RDW: 17.3 % — ABNORMAL HIGH (ref 11.5–14.5)
WBC: 10.1 10*3/uL (ref 3.6–11.0)

## 2015-04-01 LAB — TROPONIN I
Troponin I: 0.48 ng/mL — ABNORMAL HIGH (ref <0.031)
Troponin I: 1.83 ng/mL — ABNORMAL HIGH (ref <0.031)

## 2015-04-01 LAB — PROTIME-INR
INR: 1.54
Prothrombin Time: 18.7 seconds — ABNORMAL HIGH (ref 11.4–15.0)

## 2015-04-01 LAB — URINALYSIS COMPLETE WITH MICROSCOPIC (ARMC ONLY)
BILIRUBIN URINE: NEGATIVE
Glucose, UA: 50 mg/dL — AB
Hgb urine dipstick: NEGATIVE
Ketones, ur: NEGATIVE mg/dL
Leukocytes, UA: NEGATIVE
Nitrite: NEGATIVE
PH: 5 (ref 5.0–8.0)
PROTEIN: 30 mg/dL — AB
SPECIFIC GRAVITY, URINE: 1.018 (ref 1.005–1.030)

## 2015-04-01 LAB — CULTURE, BLOOD (ROUTINE X 2)

## 2015-04-01 LAB — LACTIC ACID, PLASMA
LACTIC ACID, VENOUS: 4.1 mmol/L — AB (ref 0.5–2.0)
LACTIC ACID, VENOUS: 4.2 mmol/L — AB (ref 0.5–2.0)
LACTIC ACID, VENOUS: 5.6 mmol/L — AB (ref 0.5–2.0)
LACTIC ACID, VENOUS: 5.1 mmol/L — AB (ref 0.5–2.0)
Lactic Acid, Venous: 3.9 mmol/L (ref 0.5–2.0)

## 2015-04-01 LAB — LIPASE, BLOOD: Lipase: 18 U/L — ABNORMAL LOW (ref 22–51)

## 2015-04-01 LAB — MRSA PCR SCREENING: MRSA by PCR: NEGATIVE

## 2015-04-01 LAB — APTT: aPTT: 32 seconds (ref 24–36)

## 2015-04-01 LAB — GLUCOSE, CAPILLARY: Glucose-Capillary: 200 mg/dL — ABNORMAL HIGH (ref 65–99)

## 2015-04-01 MED ORDER — ONDANSETRON HCL 4 MG/2ML IJ SOLN
INTRAMUSCULAR | Status: AC
  Administered 2015-04-01: 4 mg
  Filled 2015-04-01: qty 4

## 2015-04-01 MED ORDER — HEPARIN (PORCINE) IN NACL 100-0.45 UNIT/ML-% IJ SOLN
950.0000 [IU]/h | INTRAMUSCULAR | Status: DC
  Administered 2015-04-02: 850 [IU]/h via INTRAVENOUS
  Filled 2015-04-01 (×2): qty 250

## 2015-04-01 MED ORDER — LEVOFLOXACIN IN D5W 750 MG/150ML IV SOLN
750.0000 mg | Freq: Once | INTRAVENOUS | Status: AC
  Administered 2015-04-01: 750 mg via INTRAVENOUS
  Filled 2015-04-01: qty 150

## 2015-04-01 MED ORDER — NOREPINEPHRINE BITARTRATE 1 MG/ML IV SOLN: 5.0000 ug/min | INTRAVENOUS | Status: DC

## 2015-04-01 MED ORDER — SODIUM CHLORIDE 0.9 % IV BOLUS (SEPSIS)
1000.0000 mL | Freq: Once | INTRAVENOUS | Status: AC
  Administered 2015-04-01: 1000 mL via INTRAVENOUS

## 2015-04-01 MED ORDER — VANCOMYCIN HCL IN DEXTROSE 1-5 GM/200ML-% IV SOLN: 1000.0000 mg | INTRAVENOUS | Status: DC

## 2015-04-01 MED ORDER — AZELASTINE HCL 0.1 % NA SOLN
2.0000 | Freq: Two times a day (BID) | NASAL | Status: DC
  Administered 2015-04-01 – 2015-04-07 (×11): 2 via NASAL
  Filled 2015-04-01: qty 30

## 2015-04-01 MED ORDER — LORATADINE 10 MG PO TABS
10.0000 mg | ORAL_TABLET | Freq: Every day | ORAL | Status: DC
  Administered 2015-04-01 – 2015-04-07 (×7): 10 mg via ORAL
  Filled 2015-04-01 (×7): qty 1

## 2015-04-01 MED ORDER — HYDROCHLOROTHIAZIDE 12.5 MG PO CAPS
12.5000 mg | ORAL_CAPSULE | Freq: Every day | ORAL | Status: DC
  Filled 2015-04-01 (×2): qty 1

## 2015-04-01 MED ORDER — CHLORHEXIDINE GLUCONATE 0.12 % MT SOLN
15.0000 mL | Freq: Two times a day (BID) | OROMUCOSAL | Status: DC
  Administered 2015-04-01 – 2015-04-07 (×8): 15 mL via OROMUCOSAL
  Filled 2015-04-01: qty 15

## 2015-04-01 MED ORDER — IPRATROPIUM-ALBUTEROL 0.5-2.5 (3) MG/3ML IN SOLN
3.0000 mL | RESPIRATORY_TRACT | Status: DC
  Administered 2015-04-01 – 2015-04-04 (×17): 3 mL via RESPIRATORY_TRACT
  Filled 2015-04-01 (×17): qty 3

## 2015-04-01 MED ORDER — SODIUM CHLORIDE 0.9 % IV SOLN
INTRAVENOUS | Status: DC
  Administered 2015-04-01 – 2015-04-02 (×2): via INTRAVENOUS

## 2015-04-01 MED ORDER — CETYLPYRIDINIUM CHLORIDE 0.05 % MT LIQD
7.0000 mL | Freq: Two times a day (BID) | OROMUCOSAL | Status: DC
  Administered 2015-04-01 – 2015-04-07 (×4): 7 mL via OROMUCOSAL

## 2015-04-01 MED ORDER — PANTOPRAZOLE SODIUM 40 MG PO TBEC
40.0000 mg | DELAYED_RELEASE_TABLET | Freq: Every day | ORAL | Status: DC
  Administered 2015-04-01 – 2015-04-07 (×7): 40 mg via ORAL
  Filled 2015-04-01 (×7): qty 1

## 2015-04-01 MED ORDER — PIPERACILLIN-TAZOBACTAM 3.375 G IVPB
3.3750 g | Freq: Once | INTRAVENOUS | Status: AC
  Administered 2015-04-01: 3.375 g via INTRAVENOUS
  Filled 2015-04-01: qty 50

## 2015-04-01 MED ORDER — METHYLPREDNISOLONE SODIUM SUCC 40 MG IJ SOLR
40.0000 mg | INTRAMUSCULAR | Status: DC
  Administered 2015-04-01 – 2015-04-03 (×3): 40 mg via INTRAVENOUS
  Filled 2015-04-01 (×3): qty 1

## 2015-04-01 MED ORDER — VANCOMYCIN HCL IN DEXTROSE 1-5 GM/200ML-% IV SOLN
1000.0000 mg | INTRAVENOUS | Status: DC
  Administered 2015-04-02: 1000 mg via INTRAVENOUS
  Filled 2015-04-01: qty 200

## 2015-04-01 MED ORDER — BUDESONIDE 0.5 MG/2ML IN SUSP
0.5000 mg | Freq: Two times a day (BID) | RESPIRATORY_TRACT | Status: DC
  Administered 2015-04-01 – 2015-04-07 (×10): 0.5 mg via RESPIRATORY_TRACT
  Filled 2015-04-01 (×12): qty 2

## 2015-04-01 MED ORDER — ATENOLOL 25 MG PO TABS
25.0000 mg | ORAL_TABLET | Freq: Every day | ORAL | Status: DC
  Filled 2015-04-01 (×2): qty 1

## 2015-04-01 MED ORDER — PIPERACILLIN-TAZOBACTAM 3.375 G IVPB
3.3750 g | Freq: Three times a day (TID) | INTRAVENOUS | Status: DC
Start: 2015-04-01 — End: 2015-04-07
  Administered 2015-04-01 – 2015-04-07 (×18): 3.375 g via INTRAVENOUS
  Filled 2015-04-01 (×25): qty 50

## 2015-04-01 MED ORDER — LOSARTAN POTASSIUM 50 MG PO TABS
25.0000 mg | ORAL_TABLET | Freq: Every day | ORAL | Status: DC
  Filled 2015-04-01: qty 2
  Filled 2015-04-01: qty 1

## 2015-04-01 MED ORDER — ATORVASTATIN CALCIUM 20 MG PO TABS
20.0000 mg | ORAL_TABLET | Freq: Every day | ORAL | Status: DC
  Administered 2015-04-01 – 2015-04-07 (×7): 20 mg via ORAL
  Filled 2015-04-01 (×7): qty 1

## 2015-04-01 MED ORDER — IPRATROPIUM-ALBUTEROL 0.5-2.5 (3) MG/3ML IN SOLN
RESPIRATORY_TRACT | Status: AC
  Administered 2015-04-01: 3 mL via RESPIRATORY_TRACT
  Filled 2015-04-01: qty 9

## 2015-04-01 MED ORDER — APIXABAN 5 MG PO TABS
5.0000 mg | ORAL_TABLET | Freq: Two times a day (BID) | ORAL | Status: DC
  Administered 2015-04-01 (×2): 5 mg via ORAL
  Filled 2015-04-01 (×2): qty 1

## 2015-04-01 MED ORDER — IPRATROPIUM-ALBUTEROL 0.5-2.5 (3) MG/3ML IN SOLN: 3.0000 mL | Freq: Two times a day (BID) | RESPIRATORY_TRACT | Status: DC

## 2015-04-01 MED ORDER — IPRATROPIUM-ALBUTEROL 0.5-2.5 (3) MG/3ML IN SOLN
3.0000 mL | RESPIRATORY_TRACT | Status: AC
  Administered 2015-04-01 (×3): 3 mL via RESPIRATORY_TRACT

## 2015-04-01 MED ORDER — DEXTROSE 5 % IV SOLN
1.0000 g | INTRAVENOUS | Status: DC
  Administered 2015-04-01: 1 g via INTRAVENOUS
  Filled 2015-04-01: qty 10

## 2015-04-01 MED ORDER — VANCOMYCIN HCL IN DEXTROSE 1-5 GM/200ML-% IV SOLN
1000.0000 mg | Freq: Once | INTRAVENOUS | Status: AC
  Administered 2015-04-01: 1000 mg via INTRAVENOUS
  Filled 2015-04-01 (×2): qty 200

## 2015-04-01 MED ORDER — VANCOMYCIN HCL IN DEXTROSE 1-5 GM/200ML-% IV SOLN
1000.0000 mg | Freq: Once | INTRAVENOUS | Status: AC
  Administered 2015-04-01: 1000 mg via INTRAVENOUS
  Filled 2015-04-01: qty 200

## 2015-04-01 MED ORDER — LEVOTHYROXINE SODIUM 75 MCG PO TABS
75.0000 ug | ORAL_TABLET | Freq: Every day | ORAL | Status: DC
  Administered 2015-04-02 – 2015-04-07 (×6): 75 ug via ORAL
  Filled 2015-04-01 (×6): qty 1

## 2015-04-01 MED ORDER — CALCIUM CARBONATE-VITAMIN D 500-200 MG-UNIT PO TABS
1.0000 | ORAL_TABLET | Freq: Two times a day (BID) | ORAL | Status: DC
  Administered 2015-04-01 – 2015-04-07 (×12): 1 via ORAL
  Filled 2015-04-01 (×12): qty 1

## 2015-04-01 MED ORDER — DEXTROSE 5 % IV SOLN
500.0000 mg | INTRAVENOUS | Status: DC
  Filled 2015-04-01: qty 500

## 2015-04-01 MED ORDER — PIPERACILLIN SOD-TAZOBACTAM SO 3.375 (3-0.375) G IV SOLR
INTRAVENOUS | Status: AC
  Filled 2015-04-01: qty 3.38

## 2015-04-01 MED ORDER — NOREPINEPHRINE 4 MG/250ML-% IV SOLN: 5.0000 ug/min | INTRAVENOUS | Status: DC

## 2015-04-01 MED ORDER — ASPIRIN 81 MG PO CHEW
81.0000 mg | CHEWABLE_TABLET | Freq: Every day | ORAL | Status: DC
  Administered 2015-04-01 – 2015-04-07 (×7): 81 mg via ORAL
  Filled 2015-04-01 (×7): qty 1

## 2015-04-01 NOTE — H&P (Addendum)
[place] [facility] [place] NAME: Jenny Cooper    MR#:  [id number]  DATE OF BIRTH:  Dec 25, 1935  DATE OF ADMISSION:  04/01/2015  PRIMARY CARE PHYSICIAN: Gayland Curry, MD   REQUESTING/REFERRING PHYSICIAN: Brenton Grills, MD  CHIEF COMPLAINT:   Chief Complaint  Patient presents with  . Shortness of Breath  Cough HISTORY OF PRESENT ILLNESS:  Jenny Cooper  is a 79 y.o. female with a known history of Lung cancer followed by Dr. Oliva Bustard is being admitted for post obstructive pneumonia with possible sepsis. Patient has been having shortness of breath and coughing for last 2-3 weeks.  She went to see Dr. Elvera Bicker at [facility] and was given prescription for Z-Pak about 2 weeks ago, she responded well initially, but last Friday she started feeling worse.she emailed her primary care physician but did not get any response till now and was feeling worse so decided to come here to the emergency department. While in the ED, she underwent chest x-ray which showed bibasilar pneumonia. she is being admitted for further evaluation and management. She was feeling cold and clammy over last couple days.  She had a cough with lots of clear mucus, also has nausea but no vomiting.  Last chemotherapy was in July 6 in next one is due on August 15.  She woke up around 4 AM with not able to catch breath, tried all her inhalers without much benefit.  Called her daughter at 74 AM, who brought her here to the emergency department. While in the emergency department, she was placed on BiPAP for respiratory failure. PAST MEDICAL HISTORY:   Past Medical History  Diagnosis Date  . HTN (hypertension)   . Pacemaker   . Coronary artery disease   . HLD (hyperlipidemia)   . CAD (coronary artery disease) unknown  . Lung cancer   . History of colonoscopy 2013  . History of mammography, screening 2015  . History of Papanicolaou smear of cervix 2013  . Carcinoma of right  lung 01/03/2015   PAST SURGICAL HISTORY:   Past Surgical History  Procedure Laterality Date  . Appendectomy    . Vaginal hysterectomy    . Cardiac bypass    . Pacemaker insertion N/A    SOCIAL HISTORY:   History  Substance Use Topics  . Smoking status: Former Smoker -- 2.00 packs/day for 42 years    Types: Cigarettes    Quit date: 08/30/2000  . Smokeless tobacco: Not on file     Comment: quit smoking in 08/28/2000  . Alcohol Use: 6.0 oz/week    10 Glasses of wine per week     Comment: 2 glasses of wine per day   FAMILY HISTORY:   Family History  Problem Relation Age of Onset  . COPD Mother     sister, and brother   DRUG ALLERGIES:   Allergies  Allergen Reactions  . Lovenox [Enoxaparin Sodium] Itching  . Meperidine Other (See Comments)    Other Reaction: pt does not like how it makes her feel   REVIEW OF SYSTEMS:   Review of Systems  Constitutional: Positive for malaise/fatigue. Negative for fever, weight loss and diaphoresis.  HENT: Negative for ear discharge, ear pain, hearing loss, nosebleeds, sore throat and tinnitus.   Eyes: Negative for blurred vision and pain.  Respiratory: Positive for cough, sputum production and shortness of breath. Negative for hemoptysis and wheezing.   Cardiovascular: Negative for chest pain, palpitations, orthopnea and leg swelling.  Gastrointestinal: Negative for heartburn, nausea, vomiting, abdominal pain, diarrhea, constipation and blood in stool.  Genitourinary: Negative for dysuria, urgency and frequency.  Musculoskeletal: Negative for myalgias and back pain.  Skin: Negative for itching and rash.  Neurological: Positive for weakness. Negative for dizziness, tingling, tremors, focal weakness, seizures and headaches.  Psychiatric/Behavioral: Negative for depression. The patient is not nervous/anxious.    MEDICATIONS AT HOME:   Prior to Admission medications   Medication Sig Start Date End Date Taking? Authorizing Provider   apixaban (ELIQUIS) 5 MG TABS tablet Take 5 mg by mouth 2 (two) times daily.   Yes Historical Provider, MD  aspirin 81 MG tablet Take 81 mg by mouth daily.   Yes Historical Provider, MD  atenolol (TENORMIN) 25 MG tablet Take 25 mg by mouth daily.   Yes Historical Provider, MD  atorvastatin (LIPITOR) 20 MG tablet Take 20 mg by mouth daily.   Yes Historical Provider, MD  azelastine (ASTELIN) 0.1 % nasal spray Place 2 sprays into both nostrils 2 (two) times daily. Use in each nostril as directed   Yes Historical Provider, MD  calcium carbonate (OS-CAL) 600 MG TABS tablet Take 600 mg by mouth 2 (two) times daily with a meal.   Yes Historical Provider, MD  fexofenadine ([employer] ALLERGY RELIEF) 180 MG tablet Take 180 mg by mouth daily.   Yes Historical Provider, MD  hydrochlorothiazide (MICROZIDE) 12.5 MG capsule Take 12.5 mg by mouth daily.   Yes Historical Provider, MD  ipratropium-albuterol (DUONEB) 0.5-2.5 (3) MG/3ML SOLN Take 3 mLs by nebulization 2 (two) times daily.   Yes Historical Provider, MD  levothyroxine (SYNTHROID, LEVOTHROID) 75 MCG tablet Take 75 mcg by mouth daily before breakfast.   Yes Historical Provider, MD  losartan (COZAAR) 25 MG tablet Take 25 mg by mouth daily.   Yes Historical Provider, MD  omeprazole (PRILOSEC) 20 MG capsule Take 20 mg by mouth daily.   Yes Historical Provider, MD   VITAL SIGNS:  Blood pressure 106/73, pulse 87, temperature 98 F (36.7 C), temperature source Oral, resp. rate 22, height '5\' 7"'$  (1.702 m), weight 70.308 kg (155 lb), SpO2 100 %. PHYSICAL EXAMINATION:  Physical Exam  Constitutional: She is oriented to person, place, and time. She appears unhealthy. She appears toxic. She has a sickly appearance.  acute respiratory distress acquiring BiPAP  HENT:  Head: Normocephalic and atraumatic.  Eyes: Conjunctivae and EOM are normal. Pupils are equal, round, and reactive to light.  Neck: Normal range of motion. Neck supple. No tracheal deviation present. No  thyromegaly present.  Cardiovascular: Normal rate, regular rhythm and normal heart sounds.   Pulmonary/Chest: She is in respiratory distress (rhonchi at the bases bilaterally). She has no wheezes. She has rales. She exhibits no tenderness.  Requiring BiPAP  Abdominal: Soft. Bowel sounds are normal. She exhibits no distension. There is no tenderness.  Musculoskeletal: Normal range of motion.  Neurological: She is alert and oriented to person, place, and time. No cranial nerve deficit.  Skin: Skin is warm and dry. No rash noted.  Psychiatric: Mood and affect normal.   LABORATORY PANEL:   CBC  Recent Labs Lab 04/01/15 0822  WBC 10.1  HGB 12.2  HCT 38.1  PLT 296   ------------------------------------------------------------------------------------------------------------------  Chemistries   Recent Labs Lab 04/01/15 0822  NA 139  K 3.8  CL 98*  CO2 28  GLUCOSE 327*  BUN 22*  CREATININE 1.11*  CALCIUM 9.0  AST 30  ALT 16  ALKPHOS 72  BILITOT 0.6  Cardiac Enzymes  Recent Labs Lab 04/01/15 1210  TROPONINI 1.83*   RADIOLOGY:  Dg Chest Port 1 View  04/01/2015   CLINICAL DATA:  Respiratory distress. History of lung cancer on chemotherapy  EXAM: PORTABLE CHEST - 1 VIEW  COMPARISON:  09/25/2014  FINDINGS: Port-A-Cath tip in the SVC. 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 Dual lead pacemaker unchanged.  Progression of bilateral airspace disease in the lower lobes, right greater than left. There is extensive airspace disease on the right. This is concerning for recurrent neoplasm. Differential does include pneumonia. Small bilateral effusions.  IMPRESSION: Bibasilar airspace disease right greater than left has progressed significantly from the prior study. This is worrisome for recurrent carcinoma however pneumonia could also have this appearance. If the patient does not have current symptoms of pneumonia, CT chest with contrast recommended for further evaluation.    Electronically Signed   By: Franchot Gallo M.D.   On: 04/01/2015 08:40   IMPRESSION AND PLAN:   * Acute hypoxic respiratory failure: blood gas showing pH of 7.19 requiring BiPAP in the emergency department with a good response and pH improving to 7.30.  This is likely due to underlying pneumonia.  We will admit her to stepdown unit  * Suspected sepsis: with hypotension, elevated lactic acid, tachypnea and fever.  Broad-spectrum IV antibiotics, blood and sputum culture.  * Post-obstructive Pneumonia: Start IV Vanco + Zosyn + Levaquin. Blood c/s, Sputum c/s. Consider Pulmo c/s. We will obtain CT scan of the chest in the morning  * Non-ST elevation MI: troponin peak at 1.83, likely due to supply demand ischemia, consult cardiology, obtain 2-D echo, start her on heparin drip and aspirin.  * lung cancer: followed by Dr. Oliva Bustard.  Last chemotherapy on July 6.  Next one is August 15.  Case discussed with Dr. Oliva Bustard who will see her while here.  * hypothyroidism: continue Synthroid   All the records are reviewed and case discussed with ED provider. Management plans discussed with the patient, family (her daughter Coreen at bedside, her cell number is [phone]) and they are in agreement.  CODE STATUS: Full Code  TOTAL TIME (Critical Care) TAKING CARE OF THIS PATIENT: 55 minutes.    [facility], Damoni Causby M.D on 04/01/2015 at 4:04 PM  Between 7am to 6pm - Pager - [phone]  After 6pm go to [email or web address] - password [email or web address] [place] [facility]  Office  [phone]  CC: Primary care physician; Gayland Curry, MD

## 2015-04-01 NOTE — ED Notes (Signed)
Pt is undergoing chemotherapy for lung cancer, EMS called out for increased shortness of breath, O2 sat on RA 84%, pt arrived via EMS with NRB O2 sat 95 %, pt pale in color, complaining of nausea and air hunger

## 2015-04-01 NOTE — ED Notes (Signed)
Pt placed on BiPAP by [facility], pt tolerating well, O2 sat 100%

## 2015-04-01 NOTE — ED Notes (Signed)
CRITICAL VALUE ALERT  Critical value received:  Troponin 0.48  Date of notification:  04/01/15  Time of notification:  0913  Critical value read back:Yes.    Nurse who received alert:  Sharee Pimple  MD notified (1st page):  [phone]  Time of first page:    MD notified (2nd page):  Time of second page:  Responding MD:  Joni Fears  Time MD responded:

## 2015-04-01 NOTE — ED Provider Notes (Signed)
[facility] Emergency Department Provider Note  ____________________________________________  Time seen: 8:05 AM on arrival by EMS  I have reviewed the triage vital signs and the nursing notes.   HISTORY  Chief Complaint Shortness of Breath    HPI Jenny Cooper is a 79 y.o. female undergoing chemotherapy for lung cancer who called EMS for worsening shortness of breath. Her oxygen saturation was 84% on room air and she was placed on nonrebreather by EMS which increased the oxygen saturation to 93-95%. Patient complains of nausea and severe shortness of breath. Has a cough but nonproductive. Denies fever or chills. Last chemotherapy 1 week ago. Denies chest pain.     Past Medical History  Diagnosis Date  . HTN (hypertension)   . Pacemaker   . Coronary artery disease   . HLD (hyperlipidemia)   . CAD (coronary artery disease) unknown  . Lung cancer   . History of colonoscopy 2013  . History of mammography, screening 2015  . History of Papanicolaou smear of cervix 2013  . Carcinoma of right lung 01/03/2015    Patient Active Problem List   Diagnosis Date Noted  . Cancer 03/05/2015  . Arteriosclerosis of coronary artery 01/11/2015  . Disease of thyroid gland 01/11/2015  . Carcinoma of right lung 01/03/2015  . Disorder of peripheral nervous system 10/04/2014  . 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 Cough 09/09/2014  . Dyspnea 09/09/2014  . Atrial flutter 11/16/2013  . CAFL (chronic airflow limitation) 04/24/2012  . Cardiac conduction disorder 04/24/2012  . Heart attack 04/24/2012  . BP (high blood pressure) 08/03/2011  . Artificial cardiac pacemaker 08/03/2011    Past Surgical History  Procedure Laterality Date  . Appendectomy    . Vaginal hysterectomy    . Cardiac bypass    . Pacemaker insertion N/A     Current Outpatient Rx  Name  Route  Sig  Dispense  Refill  . apixaban (ELIQUIS) 5 MG TABS tablet   Oral   Take 5 mg by  mouth 2 (two) times daily.         Marland Kitchen aspirin 81 MG tablet   Oral   Take 81 mg by mouth daily.         Marland Kitchen atenolol (TENORMIN) 25 MG tablet   Oral   Take 25 mg by mouth daily.         Marland Kitchen atorvastatin (LIPITOR) 20 MG tablet   Oral   Take 20 mg by mouth daily.         Marland Kitchen azelastine (ASTELIN) 0.1 % nasal spray   Each Nare   Place 2 sprays into both nostrils 2 (two) times daily. Use in each nostril as directed         . calcium carbonate (OS-CAL) 600 MG TABS tablet   Oral   Take 600 mg by mouth 2 (two) times daily with a meal.         . fexofenadine ([employer] ALLERGY RELIEF) 180 MG tablet   Oral   Take 180 mg by mouth daily.         . hydrochlorothiazide (MICROZIDE) 12.5 MG capsule   Oral   Take 12.5 mg by mouth daily.         Marland Kitchen ipratropium-albuterol (DUONEB) 0.5-2.5 (3) MG/3ML SOLN   Nebulization   Take 3 mLs by nebulization 2 (two) times daily.         Marland Kitchen levothyroxine (SYNTHROID, LEVOTHROID) 75 MCG tablet   Oral   Take 75  mcg by mouth daily before breakfast.         . losartan (COZAAR) 25 MG tablet   Oral   Take 25 mg by mouth daily.         Marland Kitchen omeprazole (PRILOSEC) 20 MG capsule   Oral   Take 20 mg by mouth daily.           Allergies Lovenox and Meperidine  Family History  Problem Relation Age of Onset  . COPD Mother     sister, and brother    Social History History  Substance Use Topics  . Smoking status: Former Smoker -- 2.00 packs/day for 42 years    Types: Cigarettes    Quit date: 08/30/2000  . Smokeless tobacco: Not on file     Comment: quit smoking in 08/28/2000  . Alcohol Use: 6.0 oz/week    10 Glasses of wine per week     Comment: 2 glasses of wine per day    Review of Systems  Constitutional: No fever or chills. No weight changes Eyes:No blurry vision or double vision.  ENT: No sore throat. Cardiovascular: No chest pain. Respiratory: positive dyspnea and nonproductive coughgh. Gastrointestinal: Negative for abdominal  pain, vomiting and diarrhea.  No BRBPR or melena.positive nausea Genitourinary: Negative for dysuria, urinary retention, bloody urine, or difficulty urinating. Musculoskeletal: Negative for back pain. No joint swelling or pain. Skin: Negative for rash. Neurological: Negative for headaches, focal weakness or numbness. Psychiatric:No anxiety or depression.   Endocrine:No hot/cold intolerance, changes in energy, or sleep difficulty.  10-point ROS otherwise negative.  ____________________________________________   PHYSICAL EXAM:  VITAL SIGNS: ED Triage Vitals  Enc Vitals Group     BP --      Pulse --      Resp --      Temp --      Temp src --      SpO2 --      Weight --      Height --      Head Cir --      Peak Flow --      Pain Score --      Pain Loc --      Pain Edu? --      Excl. in [place]? --      Constitutional: Alert and oriented. Severe respiratorydistress Eyes: No scleral icterus. positiveconjunctival pallor. PERRL. EOMI ENT   Head: Normocephalic and atraumatic.   Nose: No congestion/rhinnorhea. No septal hematoma   Mouth/Throat: dry mucous membranes, no pharyngeal erythema. No peritonsillar mass. No uvula shift.   Neck: No stridor. No SubQ emphysema. No meningismus.no JVD Hematological/Lymphatic/Immunilogical: No cervical lymphadenopathy. Cardiovascular: RRR. Normal and symmetric distal pulses are present in all extremities. No murmurs, rubs, or gallops. Respiratory: tachypnea with coarse wheezy breath sounds in all fields, diminished at the right base, accessory muscle use, splinting sitting upright. Gastrointestinal: Soft and nontender. No distention. There is no CVA tenderness.  No rebound, rigidity, or guarding. Genitourinary: deferred Musculoskeletal: Nontender with normal range of motion in all extremities. No joint effusions.  No lower extremity tenderness.  No edema. Neurologic:   Normal speech and language.  CN 2-10 normal. Motor grossly  intact. No pronator drift.  Normal gait. No gross focal neurologic deficits are appreciated.  Skin:  Skin is warm, dry and intact. No rash noted.  No petechiae, purpura, or bullae.pallor,poor skin turgor Psychiatric: Mood and affect are normal. Speech and behavior are normal. Patient exhibits appropriate insight and judgment.  ____________________________________________  LABS (pertinent positives/negatives) (all labs ordered are listed, but only abnormal results are displayed) Labs Reviewed  COMPREHENSIVE METABOLIC PANEL - Abnormal; Notable for the following:    Chloride 98 (*)    Glucose, Bld 327 (*)    BUN 22 (*)    Creatinine, Ser 1.11 (*)    GFR calc non Af Amer 47 (*)    GFR calc Af Amer 54 (*)    All other components within normal limits  LIPASE, BLOOD - Abnormal; Notable for the following:    Lipase 18 (*)    All other components within normal limits  TROPONIN I - Abnormal; Notable for the following:    Troponin I 0.48 (*)    All other components within normal limits  CBC WITH DIFFERENTIAL/PLATELET - Abnormal; Notable for the following:    RDW 17.3 (*)    Neutro Abs 7.1 (*)    Monocytes Absolute 1.0 (*)    All other components within normal limits  LACTIC ACID, PLASMA - Abnormal; Notable for the following:    Lactic Acid, Venous 3.9 (*)    All other components within normal limits  BLOOD GAS, VENOUS - Abnormal; Notable for the following:    pH, Ven 7.19 (*)    pCO2, Ven 83 (*)    pO2, Ven 19.0 (*)    Bicarbonate 31.7 (*)    All other components within normal limits  CULTURE, BLOOD (ROUTINE X 2)  CULTURE, BLOOD (ROUTINE X 2)  LACTIC ACID, PLASMA  URINALYSIS COMPLETEWITH MICROSCOPIC ([facility] ONLY)   ____________________________________________   EKG  Interpreted by me Sinus tachycardia rate 110, normal axis and intervals, normal QRS and ST segments and T-wave  ____________________________________________    RADIOLOGY  Chest x-ray interpreted by me and  radiology report reviewed, reveals bilateral opacities consistent with pneumonia right greater than left.  ____________________________________________   PROCEDURES CRITICAL CARE Performed by: Joni Fears, Deserai Cansler   Total critical care time: 35 minutes  Critical care time was exclusive of separately billable procedures and treating other patients.  Critical care was necessary to treat or prevent imminent or life-threatening deterioration.  Critical care was time spent personally by me on the following activities: development of treatment plan with patient and/or surrogate as well as nursing, discussions with consultants, evaluation of patient's response to treatment, examination of patient, obtaining history from patient or surrogate, ordering and performing treatments and interventions, ordering and review of laboratory studies, ordering and review of radiographic studies, pulse oximetry and re-evaluation of patient's condition.  ____________________________________________   INITIAL IMPRESSION / ASSESSMENT AND PLAN / ED COURSE  Pertinent labs & imaging results that were available during my care of the patient were reviewed by me and considered in my medical decision making (see chart for details).  On initial evaluation the patient had severe nausea and adequate oxygenation with nonrebreather, so we kept on a nonrebreather and decided not to use BiPAP at that time pending workup.  At 8:35 AM, oxygen saturation was 88% on the nonrebreather despite nebulizer treatments. The nausea was improved and the patient no longer felt like she would vomit, so BiPAP was initiated.  ----------------------------------------- 10:08 AM on 04/01/2015 ----------------------------------------- Chest x-ray is consistent with bilateral pneumonia which explains hypercapnia and hypercarbia.  Additionally the patient has a lactic acid of 3.9, so the pH is 7.19 is likely a mixed acidosis and respiratory and  metabolic.she does have an elevated troponin of 0.48, however I have low suspicion for ACS based on her symptoms. This appears to  be due to the acidosis and stress of sepsis from bilateral pneumonia. Her breathing feels much better and she looks much more comfortable on BiPAP, and with the findings so far a low suspicion for submassive PE as a cause for her symptoms was suspicion for carditis mediastinitis or thoracic aortic dissection. No evidence of pneumothorax. Hospitalist paged for admission.   ____________________________________________   FINAL CLINICAL IMPRESSION(S) / ED DIAGNOSES  Final diagnoses:  Bilateral pneumonia  Sepsis, due to unspecified organism  Acute respiratory failure with hypoxia and hypercapnia      Carrie Mew, MD 04/01/15 1010

## 2015-04-01 NOTE — Progress Notes (Signed)
ANTIBIOTIC CONSULT NOTE - INITIAL  Pharmacy Consult for Vancomycin Indication: pneumonia  Allergies  Allergen Reactions  . Lovenox [Enoxaparin Sodium] Itching  . Meperidine Other (See Comments)    Other Reaction: pt does not like how it makes her feel    Patient Measurements: Height: '5\' 7"'$  (170.2 cm) Weight: 155 lb (70.308 kg) IBW/kg (Calculated) : 61.6 Adjusted Body Weight: 65.1 kg  Vital Signs: Temp: 98 F (36.7 C) (08/02 1350) Temp Source: Oral (08/02 1350) BP: 106/73 mmHg (08/02 1400) Pulse Rate: 87 (08/02 1400) Intake/Output from previous day:   Intake/Output from this shift:    Labs:  Recent Labs  04/01/15 0822  WBC 10.1  HGB 12.2  PLT 296  CREATININE 1.11*   Estimated Creatinine Clearance: 41.3 mL/min (by C-G formula based on Cr of 1.11). No results for input(s): VANCOTROUGH, VANCOPEAK, VANCORANDOM, GENTTROUGH, GENTPEAK, GENTRANDOM, TOBRATROUGH, TOBRAPEAK, TOBRARND, AMIKACINPEAK, AMIKACINTROU, AMIKACIN in the last 72 hours.   Microbiology: No results found for this or any previous visit (from the past 720 hour(s)).  Medical History: Past Medical History  Diagnosis Date  . HTN (hypertension)   . Pacemaker   . Coronary artery disease   . HLD (hyperlipidemia)   . CAD (coronary artery disease) unknown  . Lung cancer   . History of colonoscopy 2013  . History of mammography, screening 2015  . History of Papanicolaou smear of cervix 2013  . Carcinoma of right lung 01/03/2015    Medications:  Scheduled:  . apixaban  5 mg Oral BID  . aspirin  81 mg Oral Daily  . atenolol  25 mg Oral Daily  . atorvastatin  20 mg Oral Daily  . azelastine  2 spray Each Nare BID  . [START ON 04/02/2015] azithromycin  500 mg Intravenous Q24H  . budesonide (PULMICORT) nebulizer solution  0.5 mg Nebulization BID  . calcium-vitamin D  1 tablet Oral BID WC  . cefTRIAXone (ROCEPHIN)  IV  1 g Intravenous Q24H  . hydrochlorothiazide  12.5 mg Oral Daily  . ipratropium-albuterol   3 mL Nebulization Q4H  . [START ON 04/02/2015] levothyroxine  75 mcg Oral QAC breakfast  . loratadine  10 mg Oral Daily  . losartan  25 mg Oral Daily  . methylPREDNISolone (SOLU-MEDROL) injection  40 mg Intravenous Q24H  . pantoprazole  40 mg Oral Daily  . vancomycin  1,000 mg Intravenous Once  . [START ON 04/02/2015] vancomycin  1,000 mg Intravenous Q24H   Infusions:  . sodium chloride    . norepinephrine     Assessment: Patient is a 79 yo female admitted with sepsis/pneumonia.  No H/P at this time.   SCr: 1.11, est CrCl~41.3 mL/min, ke: 0.039, t1/2: 17.8 h, Vd: 45.5 L  Goal of Therapy:  Vancomycin trough level 15-20 mcg/ml  Plan:  Patient with history of breast cancer receiving chemo.  Patient has orders for Rochepin 1 gm IV q24h and Azithromycin 500 mg IV q24h.  Will start patient on Vancomycin 1 gm IV q24h with stacked dose ~11 hours after first dose for stacked dosing.Trough ordered prior to 4th dose on 8/5. Measure antibiotic drug levels at steady state Follow up culture results   Pharmacy will continue to follow.  Michiah Masse G 04/01/2015,3:09 PM

## 2015-04-01 NOTE — Progress Notes (Signed)
ANTIBIOTIC CONSULT NOTE - INITIAL  Pharmacy Consult for Vancomycin Indication: pneumonia  Allergies  Allergen Reactions  . Lovenox [Enoxaparin Sodium] Itching  . Meperidine Other (See Comments)    Other Reaction: pt does not like how it makes her feel    Patient Measurements: Height: '5\' 7"'$  (170.2 cm) Weight: 155 lb (70.308 kg) IBW/kg (Calculated) : 61.6 Adjusted Body Weight: 65.1 kg  Vital Signs: Temp: 98 F (36.7 C) (08/02 1350) Temp Source: Oral (08/02 1350) BP: 106/73 mmHg (08/02 1400) Pulse Rate: 87 (08/02 1400) Intake/Output from previous day:   Intake/Output from this shift:    Labs:  Recent Labs  04/01/15 0822  WBC 10.1  HGB 12.2  PLT 296  CREATININE 1.11*   Estimated Creatinine Clearance: 41.3 mL/min (by C-G formula based on Cr of 1.11). No results for input(s): VANCOTROUGH, VANCOPEAK, VANCORANDOM, GENTTROUGH, GENTPEAK, GENTRANDOM, TOBRATROUGH, TOBRAPEAK, TOBRARND, AMIKACINPEAK, AMIKACINTROU, AMIKACIN in the last 72 hours.   Microbiology: Recent Results (from the past 720 hour(s))  Culture, blood (routine x 2) Call MD if unable to obtain prior to antibiotics being given     Status: None   Collection Time: 04/01/15  2:25 PM  Result Value Ref Range Status   Specimen Description Blood  Final   Special Requests NONE  Final   Report Status 04/01/2015 FINAL  Final  Culture, blood (routine x 2) Call MD if unable to obtain prior to antibiotics being given     Status: None   Collection Time: 04/01/15  2:51 PM  Result Value Ref Range Status   Specimen Description Blood  Final   Special Requests NONE  Final   Report Status 04/01/2015 FINAL  Final    Medical History: Past Medical History  Diagnosis Date  . HTN (hypertension)   . Pacemaker   . Coronary artery disease   . HLD (hyperlipidemia)   . CAD (coronary artery disease) unknown  . Lung cancer   . History of colonoscopy 2013  . History of mammography, screening 2015  . History of Papanicolaou smear  of cervix 2013  . Carcinoma of right lung 01/03/2015    Medications:  Scheduled:  . apixaban  5 mg Oral BID  . aspirin  81 mg Oral Daily  . atenolol  25 mg Oral Daily  . atorvastatin  20 mg Oral Daily  . azelastine  2 spray Each Nare BID  . budesonide (PULMICORT) nebulizer solution  0.5 mg Nebulization BID  . calcium-vitamin D  1 tablet Oral BID WC  . hydrochlorothiazide  12.5 mg Oral Daily  . ipratropium-albuterol  3 mL Nebulization Q4H  . [START ON 04/02/2015] levothyroxine  75 mcg Oral QAC breakfast  . loratadine  10 mg Oral Daily  . losartan  25 mg Oral Daily  . methylPREDNISolone (SOLU-MEDROL) injection  40 mg Intravenous Q24H  . pantoprazole  40 mg Oral Daily  . piperacillin-tazobactam (ZOSYN)  IV  3.375 g Intravenous 3 times per day  . vancomycin  1,000 mg Intravenous Once  . [START ON 04/02/2015] vancomycin  1,000 mg Intravenous Q24H   Infusions:  . sodium chloride    . norepinephrine     Assessment: Patient is a 79 yo female admitted with sepsis/pneumonia.  No H/P at this time.   SCr: 1.11, est CrCl~41.3 mL/min, ke: 0.039, t1/2: 17.8 h, Vd: 45.5 L  Goal of Therapy:  Vancomycin trough level 15-20 mcg/ml  Plan:  Patient with history of breast cancer receiving chemo.     1. Vancomycin 1 gm  IV q24h with stacked dose ~11 hours after first dose for stacked dosing.Trough ordered prior to 4th dose on 8/5.  2. Will start Zosyn 3.375 gm IV q8h per EI protocol based on renal function.   Measure antibiotic drug levels at steady state Follow up culture results   Pharmacy will continue to follow.  Johnathyn Viscomi G 04/01/2015,3:41 PM

## 2015-04-01 NOTE — Progress Notes (Signed)
Pt currently off bipap and on 3LNC.  Sats are sustained in mid 90's.  Will continue to monitor.

## 2015-04-01 NOTE — Progress Notes (Signed)
ANTICOAGULATION CONSULT NOTE - Initial Consult  Pharmacy Consult for Heparin Indication: chest pain/ACS  Allergies  Allergen Reactions  . Lovenox [Enoxaparin Sodium] Itching  . Meperidine Other (See Comments)    Other Reaction: pt does not like how it makes her feel    Patient Measurements: Height: '5\' 7"'$  (170.2 cm) Weight: 155 lb (70.308 kg) IBW/kg (Calculated) : 61.6 Heparin Dosing Weight: 70.3 kg  Vital Signs: Temp: 98.6 F (37 C) (08/02 1700) Temp Source: Oral (08/02 1700) BP: 106/73 mmHg (08/02 1400) Pulse Rate: 87 (08/02 1400)  Labs:  Recent Labs  04/01/15 0822 04/01/15 1210  HGB 12.2  --   HCT 38.1  --   PLT 296  --   CREATININE 1.11*  --   TROPONINI 0.48* 1.83*    Estimated Creatinine Clearance: 41.3 mL/min (by C-G formula based on Cr of 1.11).   Medical History: Past Medical History  Diagnosis Date  . HTN (hypertension)   . Pacemaker   . Coronary artery disease   . HLD (hyperlipidemia)   . CAD (coronary artery disease) unknown  . Lung cancer   . History of colonoscopy 2013  . History of mammography, screening 2015  . History of Papanicolaou smear of cervix 2013  . Carcinoma of right lung 01/03/2015    Assessment: 79 yo female with NSTEMI starting heparin drip. Pt last took dose of apixaban at 1649. Spoke with admitting MD, pt is fully anticoagulated at this time with apixaban, will start heparin at 5 am (~12 h after last apixaban dose).  Hgb 12.2, Plt 296 INR 1.54, aPTT 32  Goal of Therapy:  Heparin level 0.3-0.7 units/ml Monitor platelets by anticoagulation protocol: Yes   Plan:  Will start heparin drip with no bolus, start drip at 850 units/hr (=8.5 ml/hr) at 8/3 0500 APTT, heparin level in 8h - 8/3 1300 CBC tomorrow    Rocky Morel 04/01/2015,6:02 PM

## 2015-04-01 NOTE — ED Notes (Signed)
O2 sat 83% on 6L , Non-rebreather placed on pt, MD notified,

## 2015-04-02 ENCOUNTER — Inpatient Hospital Stay: Payer: Medicare Other

## 2015-04-02 ENCOUNTER — Encounter: Payer: Self-pay | Admitting: Physician Assistant

## 2015-04-02 DIAGNOSIS — J189 Pneumonia, unspecified organism: Secondary | ICD-10-CM | POA: Insufficient documentation

## 2015-04-02 DIAGNOSIS — J449 Chronic obstructive pulmonary disease, unspecified: Secondary | ICD-10-CM

## 2015-04-02 DIAGNOSIS — J9602 Acute respiratory failure with hypercapnia: Secondary | ICD-10-CM

## 2015-04-02 DIAGNOSIS — Z9221 Personal history of antineoplastic chemotherapy: Secondary | ICD-10-CM

## 2015-04-02 DIAGNOSIS — Z950 Presence of cardiac pacemaker: Secondary | ICD-10-CM

## 2015-04-02 DIAGNOSIS — C3401 Malignant neoplasm of right main bronchus: Secondary | ICD-10-CM

## 2015-04-02 DIAGNOSIS — I10 Essential (primary) hypertension: Secondary | ICD-10-CM

## 2015-04-02 DIAGNOSIS — A419 Sepsis, unspecified organism: Principal | ICD-10-CM

## 2015-04-02 DIAGNOSIS — Z923 Personal history of irradiation: Secondary | ICD-10-CM

## 2015-04-02 DIAGNOSIS — J9601 Acute respiratory failure with hypoxia: Secondary | ICD-10-CM

## 2015-04-02 DIAGNOSIS — I25810 Atherosclerosis of coronary artery bypass graft(s) without angina pectoris: Secondary | ICD-10-CM

## 2015-04-02 DIAGNOSIS — I2510 Atherosclerotic heart disease of native coronary artery without angina pectoris: Secondary | ICD-10-CM

## 2015-04-02 DIAGNOSIS — I214 Non-ST elevation (NSTEMI) myocardial infarction: Secondary | ICD-10-CM

## 2015-04-02 DIAGNOSIS — R5383 Other fatigue: Secondary | ICD-10-CM

## 2015-04-02 DIAGNOSIS — R531 Weakness: Secondary | ICD-10-CM

## 2015-04-02 LAB — CBC
HCT: 30.8 % — ABNORMAL LOW (ref 35.0–47.0)
Hemoglobin: 10.3 g/dL — ABNORMAL LOW (ref 12.0–16.0)
MCH: 31.2 pg (ref 26.0–34.0)
MCHC: 33.5 g/dL (ref 32.0–36.0)
MCV: 93.4 fL (ref 80.0–100.0)
PLATELETS: 210 10*3/uL (ref 150–440)
RBC: 3.3 MIL/uL — ABNORMAL LOW (ref 3.80–5.20)
RDW: 17 % — AB (ref 11.5–14.5)
WBC: 9.9 10*3/uL (ref 3.6–11.0)

## 2015-04-02 LAB — GLUCOSE, CAPILLARY: Glucose-Capillary: 204 mg/dL — ABNORMAL HIGH (ref 65–99)

## 2015-04-02 LAB — APTT
APTT: 52 s — AB (ref 24–36)
APTT: 60 s — AB (ref 24–36)

## 2015-04-02 LAB — HIV ANTIBODY (ROUTINE TESTING W REFLEX): HIV Screen 4th Generation wRfx: NONREACTIVE

## 2015-04-02 LAB — HEPARIN LEVEL (UNFRACTIONATED)
HEPARIN UNFRACTIONATED: 2.86 [IU]/mL — AB (ref 0.30–0.70)
Heparin Unfractionated: >3.6 IU/mL — ABNORMAL HIGH (ref 0.30–0.70)

## 2015-04-02 MED ORDER — HEPARIN (PORCINE) IN NACL 100-0.45 UNIT/ML-% IJ SOLN
950.0000 [IU]/h | INTRAMUSCULAR | Status: DC
  Administered 2015-04-03: 950 [IU]/h via INTRAVENOUS
  Filled 2015-04-02 (×2): qty 250

## 2015-04-02 NOTE — Progress Notes (Signed)
Rechecked FSBS.  Found to be at 204.  Informed Dr Lavetta Nielsen of Blood glucose readings throughout the day.  No new orders received.  Will continue to monitor.

## 2015-04-02 NOTE — Progress Notes (Signed)
[facility]                                                                                                                                                                                            Patient Demographics   Jenny Cooper, is a 79 y.o. female, DOB - 1936-08-21, YIF:[id number]  Admit date - 04/01/2015   Admitting Physician Max Sane, MD  Outpatient Primary MD for the patient is ALDRIDGE,Reyana, MD   LOS - 1  Subjective: Patient's shortness of breath is improved off BiPAP currently on 4 L of oxygen     Review of Systems:   CONSTITUTIONAL: No documented fever. Positive fatigue, weakness. No weight gain, no weight loss.  EYES: No blurry or double vision.  ENT: No tinnitus. No postnasal drip. No redness of the oropharynx.  RESPIRATORY: Dry cough, no wheeze, no hemoptysis. Positive dyspnea.  CARDIOVASCULAR: No chest pain. No orthopnea. No palpitations. No syncope.  GASTROINTESTINAL: No nausea, no vomiting or diarrhea. No abdominal pain. No melena or hematochezia.  GENITOURINARY: No dysuria or hematuria.  ENDOCRINE: No polyuria or nocturia. No heat or cold intolerance.  HEMATOLOGY: No anemia. No bruising. No bleeding.  INTEGUMENTARY: No rashes. No lesions.  MUSCULOSKELETAL: No arthritis. No swelling. No gout.  NEUROLOGIC: No numbness, tingling, or ataxia. No seizure-type activity.  PSYCHIATRIC: No anxiety. No insomnia. No ADD.    Vitals:   Filed Vitals:   04/02/15 0800 04/02/15 0900 04/02/15 1000 04/02/15 1100  BP: 110/61 108/51 114/66 113/92  Pulse: 80 89 86 90  Temp:      TempSrc:      Resp: '20 21 17 26  '$ Height:      Weight:      SpO2: 96% 97% 97% 99%    Wt Readings from Last 3 Encounters:  04/01/15 70.308 kg (155 lb)  03/05/15 72.5 kg (159 lb 13.3 oz)  02/26/15 71.9 kg (158 lb 8.2 oz)     Intake/Output Summary (Last 24 hours) at 04/02/15 1346 Last data filed at 04/02/15 1100  Gross per 24 hour  Intake  451.81 ml  Output   1300 ml  Net -848.19 ml    Physical Exam:   GENERAL: Pleasant-appearing in no apparent distress.  HEAD, EYES, EARS, NOSE AND THROAT: Atraumatic, normocephalic. Extraocular muscles are intact. Pupils equal and reactive to light. Sclerae anicteric. No conjunctival injection. No oro-pharyngeal erythema.  NECK: Supple. There is no jugular venous distention. No bruits, no lymphadenopathy, no thyromegaly.  HEART: Regular rate and rhythm,. No murmurs, no rubs, no clicks.  LUNGS: Bilateral rhonchi, no wheezing  ABDOMEN: Soft, flat,  nontender, nondistended. Has good bowel sounds. No hepatosplenomegaly appreciated.  EXTREMITIES: No evidence of any cyanosis, clubbing, or peripheral edema.  +2 pedal and radial pulses bilaterally.  NEUROLOGIC: The patient is alert, awake, and oriented x3 with no focal motor or sensory deficits appreciated bilaterally.  SKIN: Moist and warm with no rashes appreciated.  Psych: Not anxious, depressed LN: No inguinal LN enlargement    Antibiotics   Anti-infectives    Start     Dose/Rate Route Frequency Ordered Stop   04/02/15 2030  vancomycin (VANCOCIN) IVPB 1000 mg/200 mL premix  Status:  Discontinued     1,000 mg 200 mL/hr over 60 Minutes Intravenous Every 24 hours 04/01/15 1448 04/01/15 1448   04/02/15 2030  vancomycin (VANCOCIN) IVPB 1000 mg/200 mL premix     1,000 mg 200 mL/hr over 60 Minutes Intravenous Every 24 hours 04/01/15 1448     04/02/15 0800  azithromycin (ZITHROMAX) 500 mg in dextrose 5 % 250 mL IVPB  Status:  Discontinued     500 mg 250 mL/hr over 60 Minutes Intravenous Every 24 hours 04/01/15 1419 04/01/15 1540   04/01/15 2030  vancomycin (VANCOCIN) IVPB 1000 mg/200 mL premix     1,000 mg 200 mL/hr over 60 Minutes Intravenous  Once 04/01/15 1448 04/01/15 2345   04/01/15 1545  piperacillin-tazobactam (ZOSYN) IVPB 3.375 g     3.375 g 12.5 mL/hr over 240 Minutes Intravenous 3 times per day 04/01/15 1540     04/01/15 1500   cefTRIAXone (ROCEPHIN) 1 g in dextrose 5 % 50 mL IVPB  Status:  Discontinued     1 g 100 mL/hr over 30 Minutes Intravenous Every 24 hours 04/01/15 1419 04/01/15 1540   04/01/15 1033  piperacillin-tazobactam (ZOSYN) 3.375 (3-0.375) G injection    Comments:  COTRONE, JILL: cabinet override      04/01/15 1033 04/01/15 1033   04/01/15 0930  vancomycin (VANCOCIN) IVPB 1000 mg/200 mL premix     1,000 mg 200 mL/hr over 60 Minutes Intravenous  Once 04/01/15 0918 04/01/15 1026   04/01/15 0930  levofloxacin (LEVAQUIN) IVPB 750 mg     750 mg 100 mL/hr over 90 Minutes Intravenous  Once 04/01/15 0918 04/01/15 1051   04/01/15 0930  piperacillin-tazobactam (ZOSYN) IVPB 3.375 g     3.375 g 100 mL/hr over 30 Minutes Intravenous  Once 04/01/15 0918 04/01/15 1102      Medications   Scheduled Meds: . antiseptic oral rinse  7 mL Mouth Rinse q12n4p  . aspirin  81 mg Oral Daily  . atorvastatin  20 mg Oral Daily  . azelastine  2 spray Each Nare BID  . budesonide (PULMICORT) nebulizer solution  0.5 mg Nebulization BID  . calcium-vitamin D  1 tablet Oral BID WC  . chlorhexidine  15 mL Mouth Rinse BID  . ipratropium-albuterol  3 mL Nebulization Q4H  . levothyroxine  75 mcg Oral QAC breakfast  . loratadine  10 mg Oral Daily  . methylPREDNISolone (SOLU-MEDROL) injection  40 mg Intravenous Q24H  . pantoprazole  40 mg Oral Daily  . piperacillin-tazobactam (ZOSYN)  IV  3.375 g Intravenous 3 times per day  . vancomycin  1,000 mg Intravenous Q24H   Continuous Infusions: . heparin 850 Units/hr (04/02/15 0509)  . norepinephrine     PRN Meds:.   Data Review:   Micro Results Recent Results (from the past 240 hour(s))  Culture, blood (routine x 2)     Status: None (Preliminary result)   Collection Time: 04/01/15  8:22  AM  Result Value Ref Range Status   Specimen Description BLOOD RIGHT HAND  Final   Special Requests   Final    BOTTLES DRAWN AEROBIC AND ANAEROBIC 2CC ANAEROBIC, 5CC AEROBIC   Culture NO  GROWTH 1 DAY  Final   Report Status PENDING  Incomplete  Culture, blood (routine x 2)     Status: None (Preliminary result)   Collection Time: 04/01/15  8:27 AM  Result Value Ref Range Status   Specimen Description BLOOD RIGHT HAND  Final   Special Requests   Final    BOTTLES DRAWN AEROBIC AND ANAEROBIC 3CC ANAEROBIC,5CC AEROBIC   Culture NO GROWTH 1 DAY  Final   Report Status PENDING  Incomplete  MRSA PCR Screening     Status: None   Collection Time: 04/01/15  2:04 PM  Result Value Ref Range Status   MRSA by PCR NEGATIVE NEGATIVE Final    Comment:        The GeneXpert MRSA Assay (FDA approved for NASAL specimens only), is one component of a comprehensive MRSA colonization surveillance program. It is not intended to diagnose MRSA infection nor to guide or monitor treatment for MRSA infections.   Culture, blood (routine x 2) Call MD if unable to obtain prior to antibiotics being given     Status: None   Collection Time: 04/01/15  2:25 PM  Result Value Ref Range Status   Specimen Description Blood  Final   Special Requests NONE  Final   Report Status 04/01/2015 FINAL  Final  Culture, blood (routine x 2) Call MD if unable to obtain prior to antibiotics being given     Status: None   Collection Time: 04/01/15  2:51 PM  Result Value Ref Range Status   Specimen Description Blood  Final   Special Requests NONE  Final   Report Status 04/01/2015 FINAL  Final    Radiology Reports Ct Chest Wo Contrast  04/02/2015   CLINICAL DATA:  History of lung cancer. Postobstructive pneumonia, possible sepsis. Shortness of breath and cough for last 2-3 weeks.  EXAM: CT CHEST WITHOUT CONTRAST  TECHNIQUE: Multidetector CT imaging of the chest was performed following the standard protocol without IV contrast.  COMPARISON:  Chest CT 09/25/2014, 09/17/2014.  FINDINGS: There are small bilateral pleural effusions. Previously seen central right lung mass involving the right mainstem bronchus is difficult to  separate from adjacent vasculature on this noncontrast study. There are right perihilar airspace opacities. This could reflect pneumonia. There is mild narrowing of the right mainstem bronchus and adjacent airways without complete obstruction. Ground-glass opacities are noted in the left lower lobe and lingula, also likely inflammatory.  Mild underlying COPD. Heart is mildly enlarged. Pacer wires noted in the right heart. Prior mitral valve replacement. Coronary artery and aortic calcifications. No evidence of aortic aneurysm.  No visible mediastinal or axillary adenopathy.  Chest wall soft tissues are unremarkable. Imaging into the upper abdomen shows no acute findings.  IMPRESSION: Mild narrowing of the right mainstem bronchus distally and adjacent airways. It is difficult to determine scratch head is difficult to evaluate central mass seen on prior CT given the lack of intravenous contrast. There are airspace opacities in the right perihilar region including right lower lobe and right middle lobe most compatible with pneumonia.  Patchy ground-glass opacities throughout the left lung, also likely infectious/ inflammatory.  Small bilateral pleural effusions.   Electronically Signed   By: Rolm Baptise M.D.   On: 04/02/2015 10:02   Dg  Chest Port 1 View  04/01/2015   CLINICAL DATA:  Respiratory distress. History of lung cancer on chemotherapy  EXAM: PORTABLE CHEST - 1 VIEW  COMPARISON:  09/25/2014  FINDINGS: Port-A-Cath tip in the SVC. 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 Dual lead pacemaker unchanged.  Progression of bilateral airspace disease in the lower lobes, right greater than left. There is extensive airspace disease on the right. This is concerning for recurrent neoplasm. Differential does include pneumonia. Small bilateral effusions.  IMPRESSION: Bibasilar airspace disease right greater than left has progressed significantly from the prior study. This is worrisome for recurrent carcinoma however  pneumonia could also have this appearance. If the patient does not have current symptoms of pneumonia, CT chest with contrast recommended for further evaluation.   Electronically Signed   By: Franchot Gallo M.D.   On: 04/01/2015 08:40     CBC  Recent Labs Lab 04/01/15 0822 04/02/15 1254  WBC 10.1 9.9  HGB 12.2 10.3*  HCT 38.1 30.8*  PLT 296 210  MCV 94.9 93.4  MCH 30.5 31.2  MCHC 32.1 33.5  RDW 17.3* 17.0*  LYMPHSABS 1.6  --   MONOABS 1.0*  --   EOSABS 0.3  --   BASOSABS 0.0  --     Chemistries   Recent Labs Lab 04/01/15 0822  NA 139  K 3.8  CL 98*  CO2 28  GLUCOSE 327*  BUN 22*  CREATININE 1.11*  CALCIUM 9.0  AST 30  ALT 16  ALKPHOS 72  BILITOT 0.6   ------------------------------------------------------------------------------------------------------------------ estimated creatinine clearance is 41.3 mL/min (by C-G formula based on Cr of 1.11). ------------------------------------------------------------------------------------------------------------------ No results for input(s): HGBA1C in the last 72 hours. ------------------------------------------------------------------------------------------------------------------ No results for input(s): CHOL, HDL, LDLCALC, TRIG, CHOLHDL, LDLDIRECT in the last 72 hours. ------------------------------------------------------------------------------------------------------------------ No results for input(s): TSH, T4TOTAL, T3FREE, THYROIDAB in the last 72 hours.  Invalid input(s): FREET3 ------------------------------------------------------------------------------------------------------------------ No results for input(s): VITAMINB12, FOLATE, FERRITIN, TIBC, IRON, RETICCTPCT in the last 72 hours.  Coagulation profile  Recent Labs Lab 04/01/15 2044  INR 1.54    No results for input(s): DDIMER in the last 72 hours.  Cardiac Enzymes  Recent Labs Lab 04/01/15 0822 04/01/15 1210  TROPONINI 0.48* 1.83*    ------------------------------------------------------------------------------------------------------------------ Invalid input(s): POCBNP    Assessment & Plan   Active Problems:   * Acute hypoxic respiratory failure:  Due to bilateral as well as postobstructive pneumonia continue vancomycin and Zosyn  * Suspected sepsis: with hypotension, elevated lactic acid, tachypnea and fever. Broad-spectrum IV antibiotics, blood and sputum culture.  * Post-obstructive Pneumonia: Continue IV Vanco and Zosyn  *Elevated troponin possible demand ischemia appreciate cardiology input continue heparin drip, patient chronically on Eliquis history of DVT  * lung cancer: followed by Dr. Oliva Bustard.He is consult that he'll come evaluate the patient  * hypothyroidism: continue Synthroid  *Hyperlipidemia continue atorvastatin  * Hypothyroidism continue Synthroid     Code Status Orders        Start     Ordered   04/01/15 1420  Full code   Continuous     04/01/15 1419           Consults  cardiology DVT Prophylaxis  heparin  Lab Results  Component Value Date   PLT 210 04/02/2015     Time Spent in minutes   35 minutes   Transfer patient to the floor   Dustin Flock M.D on 04/02/2015 at 1:46 PM  Between 7am to 6pm - Pager - [phone]  After  6pm go to [email or web address] - password [email or web address] [facility]   Office  [phone]

## 2015-04-02 NOTE — Consult Note (Signed)
[place] @ [facility] [phone]  Fax:(336) [place] OB: June 15, 1936  MR#: [id number]  SHF#:[id number]  Patient Care Team: Gayland Curry, MD as PCP - General (Family Medicine)  CHIEF COMPLAINT:  Chief Complaint  Patient presents with  . Shortness of Breath    VISIT DIAGNOSIS:     ICD-9-CM ICD-10-CM   1. Bilateral pneumonia 486 J18.9   2. Sepsis, due to unspecified organism 038.9 A41.9    995.91    3. Acute respiratory failure with hypoxia and hypercapnia 518.81 J96.01   4. 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   Patient had compression of the right mainstem bronchus because of enlarged lymph node and a mass. Stage is T4 N1 M0 tumor stage IIIa diagnosis in January of 2016 2.started on radiation and chemotherapy from October 21, 2014 3.finished 6 cycles of carboplatinum and Taxol  in March  29 th of 2016, As finished concurrent chemoradiation therapy and PET scan shows significant response 4. started on consolidation chemotherapy with carboplatinum and Taxol 2 cycle   Patient finished 2 cycles of consultation therapy on March 06, 2015     Carcinoma of right lung    Epidermoid carcinoma of lung   10/04/2014 Initial Diagnosis Epidermoid carcinoma of lung    Oncology Flowsheet 01/29/2015 02/05/2015 02/26/2015 03/05/2015 04/01/2015 04/02/2015  Day, Cycle Day 1, Cycle 1 Day 1, Cycle 2 Day 1, Cycle 1 Day 1, Cycle 1 - -  CARBOplatin (PARAPLATIN) IV 300 mg - 390 mg - - -  dexamethasone (DECADRON) IV [ 12 mg ] [ 20 mg ] [ 12 mg ] [ 20 mg ] - -  fosaprepitant (EMEND) IV [ 150 mg ] - [ 150 mg ] - - -  methylPREDNISolone sodium succinate 40 mg/mL (SOLU-MEDROL) IV - - - - 40 mg 40 mg  ondansetron (ZOFRAN) IJ - - - - - -   ondansetron (ZOFRAN) IV - [ 16 mg ] - [ 16 mg ] - -  PACLitaxel (TAXOL) IV 80 mg/m2 80 mg/m2 144 mg 144 mg - -  palonosetron (ALOXI) IV 0.25 mg - 0.25 mg - - -    INTERVAL HISTORY:  79 year old lady started having chills fever cough increasing shortness of breath or pedal of 2 weeks.  Patient tried to cut it get in touch with primary care physician.  Gradually patient's condition got worse and patient was admitted in the hospital through emergency room. After IV antibiotics and bronchodilator therapy patient has been doing reasonably good this morning. CT scan was done which has been reviewed independently sows right lower lobe infiltrate,  no lung nodules Ace and has been seen by cardiologist has patient was on chronic anticoagulation therapy for atrial fibrillation REVIEW OF SYSTEMS:   Gen. status: Patient is feeling weak and tired. Lungs: Increasing cough yellowish expectoration fever for last 2 weeks off and on. Cardiac: History of atrial flutter GI: No nausea no vomiting no diarrhea appetite has been stable. Neurological system: No headache no dizziness. Skin: No rash Lower extremity no edema GU: No dysuria hematuria.  As per HPI. Otherwise, a complete review of systems is negatve.  PAST MEDICAL HISTORY: Past Medical History  Diagnosis Date  . HTN (  hypertension)   . Pacemaker     a. MDT 2002; b. generator replacement 2013; c. followed by Dr. Omelia Blackwater, MD  . HLD (hyperlipidemia)   . CAD (coronary artery disease)     a. s/p MI x 2 in 2002 s/p PCI x 2 in 2002; b. s/p 2v CABG 2002; c. stress echo 07/2004 w/ evidence of pos & inf infarct & no evidence of ischemia; d. 4/08 dipyridamold scan w/ multiple areas of infarct, no ischemia, EF 49%     . Lung cancer   . History of colonoscopy 2013  . History of mammography, screening 2015  . History of Papanicolaou smear of cervix 2013  . Carcinoma of right lung 01/03/2015    a. followed by Dr. Oliva Bustard  . Mitral regurgitation     a. s/p  mitral ring placement 09/2000; b. echo 09/2010: EF 50%, inf HK, post AK, mild MR, prosthetic mitral valve ring w/ peak gradient of 10 mmHg; b. echo 2/13: EF 50%, mild MR/TR     . History of blood clots     12/2001  . Atypical atrial flutter     a. s/p ablation 07/27/2013  . PAF (paroxysmal atrial fibrillation)     a. on Eliquis   . COPD (chronic obstructive pulmonary disease)     PAST SURGICAL HISTORY: Past Surgical History  Procedure Laterality Date  . Appendectomy    . Vaginal hysterectomy    . Cardiac bypass    . Pacemaker insertion N/A     FAMILY HISTORY Family History  Problem Relation Age of Onset  . COPD Mother     sister, and brother        ADVANCED DIRECTIVES:    HEALTH MAINTENANCE: History  Substance Use Topics  . Smoking status: Former Smoker -- 2.00 packs/day for 42 years    Types: Cigarettes    Quit date: 08/30/2000  . Smokeless tobacco: Not on file     Comment: quit smoking in 08/28/2000  . Alcohol Use: 6.0 oz/week    10 Glasses of wine per week     Comment: 2 glasses of wine per day      Allergies  Allergen Reactions  . Lovenox [Enoxaparin Sodium] Itching  . Meperidine Other (See Comments)    Other Reaction: pt does not like how it makes her feel    Current Facility-Administered Medications  Medication Dose Route Frequency Provider Last Rate Last Dose  . antiseptic oral rinse (CPC / CETYLPYRIDINIUM CHLORIDE 0.05%) solution 7 mL  7 mL Mouth Rinse q12n4p Vipul Shah, MD   7 mL at 04/02/15 1200  . aspirin chewable tablet 81 mg  81 mg Oral Daily Max Sane, MD   81 mg at 04/02/15 1047  . atorvastatin (LIPITOR) tablet 20 mg  20 mg Oral Daily Max Sane, MD   20 mg at 04/02/15 1047  . azelastine (ASTELIN) 0.1 % nasal spray 2 spray  2 spray Each Nare BID Max Sane, MD   2 spray at 04/02/15 1046  . budesonide (PULMICORT) nebulizer solution 0.5 mg  0.5 mg Nebulization BID Flora Lipps, MD   0.5 mg at 04/02/15 0832  . calcium-vitamin D (OSCAL WITH D)  500-200 MG-UNIT per tablet 1 tablet  1 tablet Oral BID WC Max Sane, MD   1 tablet at 04/02/15 0851  . chlorhexidine (PERIDEX) 0.12 % solution 15 mL  15 mL Mouth Rinse BID Vipul Shah, MD   15 mL at 04/02/15 0800  . heparin ADULT infusion 100 units/mL (  25000 units/250 mL)  950 Units/hr Intravenous Continuous Dustin Flock, MD 9.5 mL/hr at 04/02/15 1517 950 Units/hr at 04/02/15 1517  . ipratropium-albuterol (DUONEB) 0.5-2.5 (3) MG/3ML nebulizer solution 3 mL  3 mL Nebulization Q4H Flora Lipps, MD   3 mL at 04/02/15 1608  . levothyroxine (SYNTHROID, LEVOTHROID) tablet 75 mcg  75 mcg Oral QAC breakfast Max Sane, MD   75 mcg at 04/02/15 0851  . loratadine (CLARITIN) tablet 10 mg  10 mg Oral Daily Max Sane, MD   10 mg at 04/02/15 1047  . methylPREDNISolone sodium succinate (SOLU-MEDROL) 40 mg/mL injection 40 mg  40 mg Intravenous Q24H Flora Lipps, MD   40 mg at 04/02/15 1517  . norepinephrine (LEVOPHED) 72m in D5W 2569mpremix infusion  5-50 mcg/min Intravenous Titrated ViMax SaneMD   5 mcg/min at 04/01/15 1633  . pantoprazole (PROTONIX) EC tablet 40 mg  40 mg Oral Daily ViMax SaneMD   40 mg at 04/02/15 1047  . piperacillin-tazobactam (ZOSYN) IVPB 3.375 g  3.375 g Intravenous 3 times per day ViMax SaneMD   3.375 g at 04/02/15 1306  . vancomycin (VANCOCIN) IVPB 1000 mg/200 mL premix  1,000 mg Intravenous Q24H ViMax SaneMD       Facility-Administered Medications Ordered in Other Encounters  Medication Dose Route Frequency Provider Last Rate Last Dose  . sodium chloride 0.9 % injection 10 mL  10 mL Intravenous PRN JaForest GleasonMD   10 mL at 02/19/15 1000    OBJECTIVE: PHYSICAL EXAM:   Filed Vitals:   04/02/15 1600  BP: 96/64  Pulse: 81  Temp:   Resp: 21     Body mass index is 24.27 kg/(m^2).    ECOG FS:2 - Symptomatic, <50% confined to bed  LAB RESULTS:  Admission on 04/01/2015  Component Date Value Ref Range Status  . Sodium 04/01/2015 139  135 - 145 mmol/L Final  . Potassium  04/01/2015 3.8  3.5 - 5.1 mmol/L Final  . Chloride 04/01/2015 98* 101 - 111 mmol/L Final  . CO2 04/01/2015 28  22 - 32 mmol/L Final  . Glucose, Bld 04/01/2015 327* 65 - 99 mg/dL Final  . BUN 04/01/2015 22* 6 - 20 mg/dL Final  . Creatinine, Ser 04/01/2015 1.11* 0.44 - 1.00 mg/dL Final  . Calcium 04/01/2015 9.0  8.9 - 10.3 mg/dL Final  . Total Protein 04/01/2015 7.4  6.5 - 8.1 g/dL Final  . Albumin 04/01/2015 4.0  3.5 - 5.0 g/dL Final  . AST 04/01/2015 30  15 - 41 U/L Final  . ALT 04/01/2015 16  14 - 54 U/L Final  . Alkaline Phosphatase 04/01/2015 72  38 - 126 U/L Final  . Total Bilirubin 04/01/2015 0.6  0.3 - 1.2 mg/dL Final  . GFR calc non Af Amer 04/01/2015 47* >60 mL/min Final  . GFR calc Af Amer 04/01/2015 54* >60 mL/min Final   Comment: (NOTE) The eGFR has been calculated using the CKD EPI equation. This calculation has not been validated in all clinical situations. eGFR's persistently <60 mL/min signify possible Chronic Kidney Disease.   . Anion gap 04/01/2015 13  5 - 15 Final  . Lipase 04/01/2015 18* 22 - 51 U/L Final  . Troponin I 04/01/2015 0.48* <0.031 ng/mL Final   Comment: READ BACK AND VERIFIED TO JILL COTRONE ON 04/01/15 AT 0910 BY QSD        PERSISTENTLY INCREASED TROPONIN VALUES IN THE RANGE OF 0.04-0.49 ng/mL CAN BE SEEN IN:       -  UNSTABLE ANGINA       -CONGESTIVE HEART FAILURE       -MYOCARDITIS       -CHEST TRAUMA       -ARRYHTHMIAS       -LATE PRESENTING MYOCARDIAL INFARCTION       -COPD   CLINICAL FOLLOW-UP RECOMMENDED.   . WBC 04/01/2015 10.1  3.6 - 11.0 K/uL Final  . RBC 04/01/2015 4.01  3.80 - 5.20 MIL/uL Final  . Hemoglobin 04/01/2015 12.2  12.0 - 16.0 g/dL Final  . HCT 04/01/2015 38.1  35.0 - 47.0 % Final  . MCV 04/01/2015 94.9  80.0 - 100.0 fL Final  . MCH 04/01/2015 30.5  26.0 - 34.0 pg Final  . MCHC 04/01/2015 32.1  32.0 - 36.0 g/dL Final  . RDW 04/01/2015 17.3* 11.5 - 14.5 % Final  . Platelets 04/01/2015 296  150 - 440 K/uL Final  .  Neutrophils Relative % 04/01/2015 71   Final  . Neutro Abs 04/01/2015 7.1* 1.4 - 6.5 K/uL Final  . Lymphocytes Relative 04/01/2015 16   Final  . Lymphs Abs 04/01/2015 1.6  1.0 - 3.6 K/uL Final  . Monocytes Relative 04/01/2015 10   Final  . Monocytes Absolute 04/01/2015 1.0* 0.2 - 0.9 K/uL Final  . Eosinophils Relative 04/01/2015 3   Final  . Eosinophils Absolute 04/01/2015 0.3  0 - 0.7 K/uL Final  . Basophils Relative 04/01/2015 0   Final  . Basophils Absolute 04/01/2015 0.0  0 - 0.1 K/uL Final  . Lactic Acid, Venous 04/01/2015 3.9* 0.5 - 2.0 mmol/L Final   Comment: CRITICAL RESULT CALLED TO, READ BACK BY AND VERIFIED WITH JILL COTRANE ON 04/01/15 AT 0916 BY QSD   . Lactic Acid, Venous 04/01/2015 4.1* 0.5 - 2.0 mmol/L Final   Comment: CRITICAL RESULT CALLED TO, READ BACK BY AND VERIFIED WITH JILL COTRONE AT 1255 ON 04/01/15 BY QSD RESULTS VERIFIED BY REPEAT TESTING   . Specimen Description 04/01/2015 BLOOD RIGHT HAND   Final  . Special Requests 04/01/2015 BOTTLES DRAWN AEROBIC AND ANAEROBIC 2CC ANAEROBIC, 5CC AEROBIC   Final  . Culture 04/01/2015 NO GROWTH 1 DAY   Final  . Report Status 04/01/2015 PENDING   Incomplete  . Specimen Description 04/01/2015 BLOOD RIGHT HAND   Final  . Special Requests 04/01/2015 BOTTLES DRAWN AEROBIC AND ANAEROBIC 3CC ANAEROBIC,5CC AEROBIC   Final  . Culture 04/01/2015 NO GROWTH 1 DAY   Final  . Report Status 04/01/2015 PENDING   Incomplete  . FIO2 04/01/2015 1.00   Final  . Delivery systems 04/01/2015 NON-REBREATHER OXYGEN MASK   Final  . pH, Ven 04/01/2015 7.19* 7.320 - 7.430 Final   Comment: CRITICAL RESULT CALLED TO, READ BACK BY AND VERIFIED WITH:  critical value called to dr Joni Fears at 0900, 04/01/2015   . pCO2, Ven 04/01/2015 83* 44.0 - 60.0 mmHg Final   Comment: CRITICAL RESULT CALLED TO, READ BACK BY AND VERIFIED WITH:  critical value called to dr Joni Fears at 0900, 04/01/2015   . pO2, Ven 04/01/2015 19.0* 30.0 - 45.0 mmHg Final  . Bicarbonate  04/01/2015 31.7* 21.0 - 28.0 mEq/L Final  . Acid-Base Excess 04/01/2015 1.0  0.0 - 3.0 mmol/L Final  . Patient temperature 04/01/2015 37.0   Final  . Collection site 04/01/2015 VENOUS   Final  . Sample type 04/01/2015 VENOUS   Final  . Color, Urine 04/01/2015 YELLOW* YELLOW Final  . APPearance 04/01/2015 HAZY* CLEAR Final  . Glucose, UA 04/01/2015 50* NEGATIVE mg/dL Final  .  Bilirubin Urine 04/01/2015 NEGATIVE  NEGATIVE Final  . Ketones, ur 04/01/2015 NEGATIVE  NEGATIVE mg/dL Final  . Specific Gravity, Urine 04/01/2015 1.018  1.005 - 1.030 Final  . Hgb urine dipstick 04/01/2015 NEGATIVE  NEGATIVE Final  . pH 04/01/2015 5.0  5.0 - 8.0 Final  . Protein, ur 04/01/2015 30* NEGATIVE mg/dL Final  . Nitrite 04/01/2015 NEGATIVE  NEGATIVE Final  . Leukocytes, UA 04/01/2015 NEGATIVE  NEGATIVE Final  . RBC / HPF 04/01/2015 0-5  0 - 5 RBC/hpf Final  . WBC, UA 04/01/2015 0-5  0 - 5 WBC/hpf Final  . Bacteria, UA 04/01/2015 RARE* NONE SEEN Final  . Squamous Epithelial / LPF 04/01/2015 6-30* NONE SEEN Final  . Mucous 04/01/2015 PRESENT   Final  . Hyaline Casts, UA 04/01/2015 PRESENT   Final  . FIO2 04/01/2015 0.75   Final  . Mode 04/01/2015 BILEVEL POSITIVE AIRWAY PRESSURE   Final  . LHR 04/01/2015 8   Final  . Peep/cpap 04/01/2015 5.0   Final  . pH, Ven 04/01/2015 7.30* 7.320 - 7.430 Final  . pCO2, Ven 04/01/2015 49  44.0 - 60.0 mmHg Final  . Bicarbonate 04/01/2015 24.1  21.0 - 28.0 mEq/L Final  . Acid-base deficit 04/01/2015 2.8* 0.0 - 2.0 mmol/L Final  . Patient temperature 04/01/2015 37.0   Final  . Collection site 04/01/2015 VEIN   Final  . Sample type 04/01/2015 VEIN   Final  . Lactic Acid, Venous 04/01/2015 4.2* 0.5 - 2.0 mmol/L Final   Comment: CRITICAL RESULT CALLED TO, READ BACK BY AND VERIFIED WITH  CHERYL SMITH AT 5621 04/01/15 SDR RESULTS VERIFIED BY REPEAT TESTING   . Troponin I 04/01/2015 1.83* <0.031 ng/mL Final   Comment: RESULTS PREVIOUSLY CALLED TO JILL COTRONE AT 0910 ON  04/01/15 BY QSD...MLZ        POSSIBLE MYOCARDIAL ISCHEMIA. SERIAL TESTING RECOMMENDED.   Marland Kitchen MRSA by PCR 04/01/2015 NEGATIVE  NEGATIVE Final   Comment:        The GeneXpert MRSA Assay (FDA approved for NASAL specimens only), is one component of a comprehensive MRSA colonization surveillance program. It is not intended to diagnose MRSA infection nor to guide or monitor treatment for MRSA infections.   Marland Kitchen HIV Screen 4th Generation wRfx 04/01/2015 Non Reactive  Non Reactive Final   Comment: (NOTE) Performed At: [facility] [place], [place] [id number] Lindon Romp MD NG:[id number]   . Specimen Description 04/01/2015 Blood   Final  . Special Requests 04/01/2015 NONE   Final  . Report Status 04/01/2015 04/01/2015 FINAL   Final  . Specimen Description 04/01/2015 Blood   Final  . Special Requests 04/01/2015 NONE   Final  . Report Status 04/01/2015 04/01/2015 FINAL   Final  . Lactic Acid, Venous 04/01/2015 5.6* 0.5 - 2.0 mmol/L Final   Comment: CRITICAL RESULT CALLED TO, READ BACK BY AND VERIFIED WITH  DELL HOPKINS AT 2040 04/01/15 SDR RESULTS VERIFIED BY REPEAT TESTING   . Lactic Acid, Venous 04/01/2015 5.1* 0.5 - 2.0 mmol/L Final   Comment: CRITICAL RESULT CALLED TO, READ BACK BY AND VERIFIED WITH MICHELLE WILLIAMS _0  04/01/15 BY AJO   . Glucose-Capillary 04/01/2015 200* 65 - 99 mg/dL Final  . aPTT 04/01/2015 32  24 - 36 seconds Final  . Prothrombin Time 04/01/2015 18.7* 11.4 - 15.0 seconds Final  . INR 04/01/2015 1.54   Final  . Heparin Unfractionated 04/01/2015 >3.60* 0.30 - 0.70 IU/mL Final   Comment: RESULTS CONFIRMED BY MANUAL DILUTION  IF HEPARIN RESULTS ARE BELOW EXPECTED VALUES, AND PATIENT DOSAGE HAS BEEN CONFIRMED, SUGGEST FOLLOW UP TESTING OF ANTITHROMBIN III LEVELS.   Marland Kitchen aPTT 04/02/2015 52* 24 - 36 seconds Final   Comment:        IF BASELINE aPTT IS ELEVATED, SUGGEST PATIENT RISK ASSESSMENT BE USED TO DETERMINE  APPROPRIATE ANTICOAGULANT THERAPY.   . Heparin Unfractionated 04/02/2015 2.86* 0.30 - 0.70 IU/mL Final   Comment: RESULTS CONFIRMED BY MANUAL DILUTION        IF HEPARIN RESULTS ARE BELOW EXPECTED VALUES, AND PATIENT DOSAGE HAS BEEN CONFIRMED, SUGGEST FOLLOW UP TESTING OF ANTITHROMBIN III LEVELS.   . WBC 04/02/2015 9.9  3.6 - 11.0 K/uL Final  . RBC 04/02/2015 3.30* 3.80 - 5.20 MIL/uL Final  . Hemoglobin 04/02/2015 10.3* 12.0 - 16.0 g/dL Final  . HCT 04/02/2015 30.8* 35.0 - 47.0 % Final  . MCV 04/02/2015 93.4  80.0 - 100.0 fL Final  . MCH 04/02/2015 31.2  26.0 - 34.0 pg Final  . MCHC 04/02/2015 33.5  32.0 - 36.0 g/dL Final  . RDW 04/02/2015 17.0* 11.5 - 14.5 % Final  . Platelets 04/02/2015 210  150 - 440 K/uL Final  . Glucose-Capillary 04/02/2015 204* 65 - 99 mg/dL Final     STUDIES: Ct Chest Wo Contrast  04/02/2015   CLINICAL DATA:  History of lung cancer. Postobstructive pneumonia, possible sepsis. Shortness of breath and cough for last 2-3 weeks.  EXAM: CT CHEST WITHOUT CONTRAST  TECHNIQUE: Multidetector CT imaging of the chest was performed following the standard protocol without IV contrast.  COMPARISON:  Chest CT 09/25/2014, 09/17/2014.  FINDINGS: There are small bilateral pleural effusions. Previously seen central right lung mass involving the right mainstem bronchus is difficult to separate from adjacent vasculature on this noncontrast study. There are right perihilar airspace opacities. This could reflect pneumonia. There is mild narrowing of the right mainstem bronchus and adjacent airways without complete obstruction. Ground-glass opacities are noted in the left lower lobe and lingula, also likely inflammatory.  Mild underlying COPD. Heart is mildly enlarged. Pacer wires noted in the right heart. Prior mitral valve replacement. Coronary artery and aortic calcifications. No evidence of aortic aneurysm.  No visible mediastinal or axillary adenopathy.  Chest wall soft tissues are  unremarkable. Imaging into the upper abdomen shows no acute findings.  IMPRESSION: Mild narrowing of the right mainstem bronchus distally and adjacent airways. It is difficult to determine scratch head is difficult to evaluate central mass seen on prior CT given the lack of intravenous contrast. There are airspace opacities in the right perihilar region including right lower lobe and right middle lobe most compatible with pneumonia.  Patchy ground-glass opacities throughout the left lung, also likely infectious/ inflammatory.  Small bilateral pleural effusions.   Electronically Signed   By: Rolm Baptise M.D.   On: 04/02/2015 10:02   Dg Chest Port 1 View  04/01/2015   CLINICAL DATA:  Respiratory distress. History of lung cancer on chemotherapy  EXAM: PORTABLE CHEST - 1 VIEW  COMPARISON:  09/25/2014  FINDINGS: Port-A-Cath tip in the SVC. 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 Dual lead pacemaker unchanged.  Progression of bilateral airspace disease in the lower lobes, right greater than left. There is extensive airspace disease on the right. This is concerning for recurrent neoplasm. Differential does include pneumonia. Small bilateral effusions.  IMPRESSION: Bibasilar airspace disease right greater than left has progressed significantly from the prior study. This is worrisome for recurrent carcinoma however pneumonia could also  have this appearance. If the patient does not have current symptoms of pneumonia, CT chest with contrast recommended for further evaluation.   Electronically Signed   By: Franchot Gallo M.D.   On: 04/01/2015 08:40    ASSESSMENT:  Acute right lower lobe infiltrate with symptoms suggestive of pneumonia Patient is being treated with IV antibiotics CT scan has been reviewed there is no evidence of recurrent disease Patient has a previous history of carcinoma of lung T4 N1 M0 tumor stage IIIA treated with radiation and chemotherapy.  Last chemotherapy was in June of 2016 no further  chemotherapy has been planned at present time Continue intravenous antibiotics and broncho  dilator therapy  Plan  Continue IV antibiotics. .  Reevaluate patient in next few weeks Carcinoma of right lung   Staging form: Lung, AJCC 7th Edition     Clinical: Stage IIIA (T4, N1, M0) - Unsigned   Forest Gleason, MD   04/02/2015 4:16 PM

## 2015-04-02 NOTE — Progress Notes (Signed)
   04/02/15 1124  Clinical Encounter Type  Visited With Patient and family together  Visit Type Initial  Consult/Referral To Chaplain  Spiritual Encounters  Spiritual Needs Emotional  Stress Factors  Patient Stress Factors None identified  Family Stress Factors None identified  Chaplain rounded in the unit and offered a compassionate presence and introduction to patient and family member. Patient did not have a request or need at the time.  Chaplain Sharene Krikorian A.Clorene Nerio Ext. [phone]

## 2015-04-02 NOTE — Progress Notes (Signed)
ANTICOAGULATION CONSULT NOTE - Follow Up Consult  Pharmacy Consult for Heparin Indication: chest pain/ACS  Allergies  Allergen Reactions  . Lovenox [Enoxaparin Sodium] Itching  . Meperidine Other (See Comments)    Other Reaction: pt does not like how it makes her feel    Patient Measurements: Height: '5\' 7"'$  (170.2 cm) Weight: 155 lb (70.308 kg) IBW/kg (Calculated) : 61.6 Heparin Dosing Weight: 70.3 kg  Vital Signs: Temp: 98.5 F (36.9 C) (08/03 0730) Temp Source: Oral (08/03 0730) BP: 114/63 mmHg (08/03 1200) Pulse Rate: 87 (08/03 1200)  Labs:  Recent Labs  04/01/15 0822 04/01/15 1210 04/01/15 2042 04/01/15 2044 04/02/15 1254  HGB 12.2  --   --   --  10.3*  HCT 38.1  --   --   --  30.8*  PLT 296  --   --   --  210  APTT  --   --   --  32 52*  LABPROT  --   --   --  18.7*  --   INR  --   --   --  1.54  --   HEPARINUNFRC  --   --  >3.60*  --  2.86*  CREATININE 1.11*  --   --   --   --   TROPONINI 0.48* 1.83*  --   --   --     Estimated Creatinine Clearance: 41.3 mL/min (by C-G formula based on Cr of 1.11).   Medical History: Past Medical History  Diagnosis Date  . HTN (hypertension)   . Pacemaker     a. MDT 2002; b. generator replacement 2013; c. followed by Dr. Omelia Blackwater, MD  . HLD (hyperlipidemia)   . CAD (coronary artery disease)     a. s/p MI x 2 in 2002 s/p PCI x 2 in 2002; b. s/p 2v CABG 2002; c. stress echo 07/2004 w/ evidence of pos & inf infarct & no evidence of ischemia; d. 4/08 dipyridamold scan w/ multiple areas of infarct, no ischemia, EF 49%     . Lung cancer   . History of colonoscopy 2013  . History of mammography, screening 2015  . History of Papanicolaou smear of cervix 2013  . Carcinoma of right lung 01/03/2015    a. followed by Dr. Oliva Bustard  . Mitral regurgitation     a. s/p mitral ring placement 09/2000; b. echo 09/2010: EF 50%, inf HK, post AK, mild MR, prosthetic mitral valve ring w/ peak gradient of 10 mmHg; b. echo 2/13: EF 50%, mild MR/TR      . History of blood clots     12/2001  . Atypical atrial flutter     a. s/p ablation 07/27/2013  . PAF (paroxysmal atrial fibrillation)     a. on Eliquis   . COPD (chronic obstructive pulmonary disease)     Assessment: 79 yo female with NSTEMI starting heparin drip. Pt last took dose of apixaban at 1649. Spoke with admitting MD, pt is fully anticoagulated at this time with apixaban, will start heparin at 5 am (~12 h after last apixaban dose).  Hgb 12.2, Plt 296 INR 1.54, aPTT 32  APTT at 1254: 52 HL at 1254: 2.86   Goal of Therapy:  Heparin level 0.3-0.7 units/ml Monitor platelets by anticoagulation protocol: Yes  APTT goal: 68-109   Plan:  HL elevated due to prior apixaban use.  Will dose heparin drip using aPTT levels.  Will increase drip rate to 9.5 mL/hr based on aPTT of 52 and  recheck aPTT in 6 hours.  Will recheck HL and CBC in AM.  Once HL and aPTT correlate, may start using HL to assess dosing.   Pharmacy will continue to follow.  Ademola Vert G 04/02/2015,3:01 PM

## 2015-04-02 NOTE — Progress Notes (Signed)
RN notified Dr Ulysees Barns that patient is to go for CT today, MD states that patient can go to [facility] without RN.

## 2015-04-02 NOTE — Progress Notes (Signed)
RN notify Dr Ulysees Barns to clarify if MD wants patient to be on off unit telemetry as patient has cardiology consulted.  MD states "yes, order off unit telemetry"

## 2015-04-02 NOTE — Consult Note (Signed)
Cardiology Consultation Note  Patient ID: Jenny Cooper, MRN: [id number], DOB/AGE: 1936/02/19 79 y.o. Admit date: 04/01/2015   Date of Consult: 04/02/2015 Primary Physician: Gayland Curry, MD Primary Cardiologist: [facility] Primary Electrophysiologist: [facility]   Chief Complaint: SOB Reason for Consult: Elevated troponin   HPI: 79 y.o. female with h/o CAD s/p remote history of MI in 2002 s/p 2 vessel CABG post stenting in 2002, history of mitral valve repair in 2002 secondary to mitral regurgitation, history of PAF s/p prior TEE/DCCV on Eliquis, atypical atrial flutter s/p ablation on 07/27/2013 s/p MDT PPM, carcinoma of right lung, COPD, HTN, and HLD who presented to [facility] on 8/2 with increased SOB and was found to have PNA, possibly post obstructive and elevated troponin.   She has known CAD as above with history of MI x 2 in January of 2002. She underwent PCI x 2 at this time. Per her report she did not tolerate this well which led to her 2 vessel CABG at that time. She underwent stress echo in 2005 that showed evidence of posterior and inferior infarct and no evidence of ischemia. She last underwent ischemic evaluation in 11/2006 via dipyridamole stress test that showed multiple areas of infarct without ischemia. EF 49%. She has undergone multiple echoes over the years that have shown stable EF. Echo in 2012 showed an EF 50%, inferior HK, posteriro AK, mild MR with prosthetic mitral valve ring with peak gradient of 10 mm Hg. Echo in 2013 showed an EF of 50% with mild MR/TR. She has known PAF, previously on warfarin, but has been changed to Eliquis by per primary treating team. She has undergone TEE/DCCV in 2013 for her PAF. In 2014 she was diagnosed with atypical atrial flutter and underwent ablation. She most recently underwent PPM generator change in 2013.   She was diagnosed with stage IIIa squamous cell lung cancer of the right lung hilum in January 2016. At her last follow up with Dr. Oliva Bustard on  03/05/2015 she had finished concurrent chemoradiation with carboplatinum and Taxol and PET scan showed significant response. She was to proceed with 2nd cycle of therapy, be evaluated by suvivorship clinic and have a follow up PET scan in the next few months.   She had seen her PCP for possible bronchitis and was treated with a Zpack. Unfortunately, her symptoms persisted and ultimately got worse causing her to present to [facility] on 8/2. She complained of increase SOB, cough that was productive of green to yellow sputum, nausea, and vomiting. No chest pain, diaphoresis, presyncope, or syncope. No paresthesias. No edema. Symptoms are similar to her prior MI in 2002, except in 2002 she had bilateral upper extremity paraesthesias.   Upon her arrival she was found to be hypoxic with O2 sats in the low 90s, ultimately requiring BiPAP. CXR showed bibasilar airspace disease right greater than left has progressed significantly from the prior study, worrisome for recurrent carcinoma however pneumonia could also have this appearance. CT chest has been ordered. Troponin was found to be 0.48-->1.83. Blood cultures negative to date. Lactic acid peaked at 5.6-->5.1. WBC is quite elevated for her at 10.1 (baseline around 2.9). Echo showed EF 30-35%, severe anterior and infero/posterior wall HK. Left ventricular function parameters were normal. mild to moderate MR. Left atrium was mildly dilated. RV systolic function was normal. Mild to moderate TR. PASP was moderately to severely elevated at 60 mm Hg. She was started on heparin gtt, vancomycin, and Zosyn. She is currently of BiPAP and on nasal  cannula.     Past Medical History  Diagnosis Date  . HTN (hypertension)   . Pacemaker     a. MDT 2002; b. generator replacement 2013; c. followed by Dr. Omelia Blackwater, MD  . HLD (hyperlipidemia)   . CAD (coronary artery disease)     a. s/p MI x 2 in 2002 s/p PCI x 2 in 2002; b. s/p 2v CABG 2002; c. stress echo 07/2004 w/ evidence of pos &  inf infarct & no evidence of ischemia; d. 4/08 dipyridamold scan w/ multiple areas of infarct, no ischemia, EF 49%     . Lung cancer   . History of colonoscopy 2013  . History of mammography, screening 2015  . History of Papanicolaou smear of cervix 2013  . Carcinoma of right lung 01/03/2015    a. followed by Dr. Oliva Bustard  . Mitral regurgitation     a. s/p mitral ring placement 09/2000; b. echo 09/2010: EF 50%, inf HK, post AK, mild MR, prosthetic mitral valve ring w/ peak gradient of 10 mmHg; b. echo 2/13: EF 50%, mild MR/TR     . History of blood clots     12/2001  . Atypical atrial flutter     a. s/p ablation 07/27/2013  . PAF (paroxysmal atrial fibrillation)     a. on Eliquis   . COPD (chronic obstructive pulmonary disease)       Most Recent Cardiac Studies: Echo 03/2014:  ECHOCARDIOGRAPHIC DESCRIPTIONS ----------------------------------------------- AORTIC ROOT Size: DILATED Dissection: INDETERM FOR DISSECTION AORTIC VALVE Leaflets: Tricuspid Morphology: Normal Mobility: Fully Mobile LEFT VENTRICLE Anterior: Normal Size: Normal Lateral: Normal Contraction: REGIONALLY IMPAIRED Septal: Normal Closest EF: 50% (Estimated) Apical: Normal LV masses: No Masses Inferior: HYPOCONTRACTILE LVH: None Posterior: AKINETIC Dias.FxClass: N/A MITRAL VALVE Leaflets: Normal Mobility: PARTIALLY MOBILE Morphology: PROSTHETIC RING LEFT ATRIUM Size: Normal LA masses: No masses Normal IAS MAIN PA Size: Not seen PULMONIC VALVE Morphology: Normal Mobility: Fully Mobile RIGHT VENTRICLE Size: Normal Free wall: Normal Contraction: Normal RV masses: CATHETER IN RV TRICUSPID VALVE Leaflets: Normal Mobility: Fully mobile Morphology: Normal RIGHT ATRIUM Size: Normal RA Other: None RA masses: CATHETER IN RA PERICARDIUM Fluid: No effusion INFERIOR VENACAVA Size: Normal Normal respiratory collapse DOPPLER ECHO and OTHER SPECIAL PROCEDURES ------------------------------------ Aortic: No AR No  AS Mitral: MILD MR PROSTHETIC MV RING 1.6 m/s peak vel 10 mmHg peak grad 4 mmHg mean grad MV Inflow E Vel.= nm* cm/s MV Annulus E'Vel.= nm* cm/s E/E'Ratio= nm* Tricuspid: MILD TR No TS 2.5 m/s peak TR vel 28 mmHg peak RV pressure Pulmonary: TRIVIAL PR No PS Other:  INTERPRETATION --------------------------------------------------------------- MILD LV DYSFUNCTION (See above) NORMAL RIGHT VENTRICULAR SYSTOLIC FUNCTION VALVULAR REGURGITATION: MILD MR, TRIVIAL PR, MILD TR PROSTHETIC VALVE(S): PROSTHETIC MV RING Compared with prior Echo study on 10/04/2011: LVEF APPEARS SIMILAR   Echo 04/01/2015:  Study Conclusions  - Left ventricle: The cavity size was normal. Systolic function was moderately to severely reduced. The estimated ejection fraction was in the range of 30% to 35%. Severe anterior and infero/posterior wall hypokinesis. Left ventricular diastolic function parameters were normal. - Mitral valve: There was mild to moderate regurgitation. - Left atrium: The atrium was mildly dilated. - Right ventricle: Systolic function was normal. - Tricuspid valve: There was mild-moderate regurgitation. - Pulmonary arteries: Systolic pressure was moderate to severely elevated. PA peak pressure: 60 mm Hg (S).    Surgical History:  Past Surgical History  Procedure Laterality Date  . Appendectomy    . Vaginal hysterectomy    . Cardiac  bypass    . Pacemaker insertion N/A      Home Meds: Prior to Admission medications   Medication Sig Start Date End Date Taking? Authorizing Provider  apixaban (ELIQUIS) 5 MG TABS tablet Take 5 mg by mouth 2 (two) times daily.   Yes Historical Provider, MD  aspirin 81 MG tablet Take 81 mg by mouth daily.   Yes Historical Provider, MD  atenolol (TENORMIN) 25 MG tablet Take 25 mg by mouth daily.   Yes Historical Provider, MD  atorvastatin (LIPITOR) 20 MG tablet Take 20 mg by mouth daily.   Yes Historical Provider, MD  azelastine (ASTELIN) 0.1 %  nasal spray Place 2 sprays into both nostrils 2 (two) times daily. Use in each nostril as directed   Yes Historical Provider, MD  calcium carbonate (OS-CAL) 600 MG TABS tablet Take 600 mg by mouth 2 (two) times daily with a meal.   Yes Historical Provider, MD  fexofenadine ([employer] ALLERGY RELIEF) 180 MG tablet Take 180 mg by mouth daily.   Yes Historical Provider, MD  hydrochlorothiazide (MICROZIDE) 12.5 MG capsule Take 12.5 mg by mouth daily.   Yes Historical Provider, MD  ipratropium-albuterol (DUONEB) 0.5-2.5 (3) MG/3ML SOLN Take 3 mLs by nebulization 2 (two) times daily.   Yes Historical Provider, MD  levothyroxine (SYNTHROID, LEVOTHROID) 75 MCG tablet Take 75 mcg by mouth daily before breakfast.   Yes Historical Provider, MD  losartan (COZAAR) 25 MG tablet Take 25 mg by mouth daily.   Yes Historical Provider, MD  omeprazole (PRILOSEC) 20 MG capsule Take 20 mg by mouth daily.   Yes Historical Provider, MD    Inpatient Medications:  . antiseptic oral rinse  7 mL Mouth Rinse q12n4p  . aspirin  81 mg Oral Daily  . atenolol  25 mg Oral Daily  . atorvastatin  20 mg Oral Daily  . azelastine  2 spray Each Nare BID  . budesonide (PULMICORT) nebulizer solution  0.5 mg Nebulization BID  . calcium-vitamin D  1 tablet Oral BID WC  . chlorhexidine  15 mL Mouth Rinse BID  . hydrochlorothiazide  12.5 mg Oral Daily  . ipratropium-albuterol  3 mL Nebulization Q4H  . levothyroxine  75 mcg Oral QAC breakfast  . loratadine  10 mg Oral Daily  . losartan  25 mg Oral Daily  . methylPREDNISolone (SOLU-MEDROL) injection  40 mg Intravenous Q24H  . pantoprazole  40 mg Oral Daily  . piperacillin-tazobactam (ZOSYN)  IV  3.375 g Intravenous 3 times per day  . vancomycin  1,000 mg Intravenous Q24H   . sodium chloride 125 mL/hr at 04/02/15 0605  . heparin 850 Units/hr (04/02/15 0509)  . norepinephrine      Allergies:  Allergies  Allergen Reactions  . Lovenox [Enoxaparin Sodium] Itching  . Meperidine Other  (See Comments)    Other Reaction: pt does not like how it makes her feel    History   Social History  . Marital Status: Widowed    Spouse Name: N/A  . Number of Children: N/A  . Years of Education: N/A   Occupational History  . store clerk   . book-keeper    Social History Main Topics  . Smoking status: Former Smoker -- 2.00 packs/day for 42 years    Types: Cigarettes    Quit date: 08/30/2000  . Smokeless tobacco: Not on file     Comment: quit smoking in 08/28/2000  . Alcohol Use: 6.0 oz/week    10 Glasses of wine per week  Comment: 2 glasses of wine per day  . Drug Use: No  . Sexual Activity: Not on file   Other Topics Concern  . Not on file   Social History Narrative     Family History  Problem Relation Age of Onset  . COPD Mother     sister, and brother     Review of Systems: Review of Systems  Constitutional: Positive for weight loss and malaise/fatigue. Negative for fever, chills and diaphoresis.  HENT: Negative for congestion.   Eyes: Negative for blurred vision, discharge and redness.  Respiratory: Positive for cough, sputum production, shortness of breath and wheezing. Negative for hemoptysis.        Green to yellow sputum   Cardiovascular: Negative for chest pain, palpitations, orthopnea, claudication, leg swelling and PND.  Gastrointestinal: Positive for nausea and vomiting. Negative for heartburn, abdominal pain, blood in stool and melena.  Genitourinary: Negative for hematuria.  Musculoskeletal: Positive for myalgias. Negative for falls.  Skin: Negative for itching and rash.  Neurological: Positive for weakness. Negative for dizziness, sensory change, speech change, focal weakness and headaches.  Endo/Heme/Allergies: Does not bruise/bleed easily.  Psychiatric/Behavioral: The patient is not nervous/anxious.   All other systems reviewed and are negative.   Labs:  Recent Labs  04/01/15 0822 04/01/15 1210  TROPONINI 0.48* 1.83*   Lab  Results  Component Value Date   WBC 10.1 04/01/2015   HGB 12.2 04/01/2015   HCT 38.1 04/01/2015   MCV 94.9 04/01/2015   PLT 296 04/01/2015     Recent Labs Lab 04/01/15 0822  NA 139  K 3.8  CL 98*  CO2 28  BUN 22*  CREATININE 1.11*  CALCIUM 9.0  PROT 7.4  BILITOT 0.6  ALKPHOS 72  ALT 16  AST 30  GLUCOSE 327*   No results found for: CHOL, HDL, LDLCALC, TRIG No results found for: DDIMER  Radiology/Studies:  Dg Chest Port 1 View  04/01/2015   CLINICAL DATA:  Respiratory distress. History of lung cancer on chemotherapy  EXAM: PORTABLE CHEST - 1 VIEW  COMPARISON:  09/25/2014  FINDINGS: Port-A-Cath tip in the SVC. 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 Dual lead pacemaker unchanged.  Progression of bilateral airspace disease in the lower lobes, right greater than left. There is extensive airspace disease on the right. This is concerning for recurrent neoplasm. Differential does include pneumonia. Small bilateral effusions.  IMPRESSION: Bibasilar airspace disease right greater than left has progressed significantly from the prior study. This is worrisome for recurrent carcinoma however pneumonia could also have this appearance. If the patient does not have current symptoms of pneumonia, CT chest with contrast recommended for further evaluation.   Electronically Signed   By: Franchot Gallo M.D.   On: 04/01/2015 08:40    EKG: sinus tachycardia, 110 bpm, wandering baseline, inferolateral TWI   Weights: Filed Weights   04/01/15 0810  Weight: 155 lb (70.308 kg)     Physical Exam: Blood pressure 109/59, pulse 83, temperature 98.5 F (36.9 C), temperature source Oral, resp. rate 16, height '5\' 7"'$  (1.702 m), weight 155 lb (70.308 kg), SpO2 95 %. Body mass index is 24.27 kg/(m^2). General: Well developed, well nourished, in no acute distress. Head: Normocephalic, atraumatic, sclera non-icteric, no xanthomas, nares are without discharge.  Neck: Negative for carotid bruits. JVD not  elevated. Lungs: Coarse breath sounds bilaterally. Breathing is unlabored. Heart: RRR with S1 S2. II/VI systolic murmurs at apex. No rubs, or gallops appreciated. Abdomen: Soft, non-tender, non-distended with normoactive  bowel sounds. No hepatomegaly. No rebound/guarding. No obvious abdominal masses. Msk:  Strength and tone appear normal for age. Extremities: No clubbing or cyanosis. No edema.  Distal pedal pulses are 2+ and equal bilaterally. Neuro: Alert and oriented X 3. No facial asymmetry. No focal deficit. Moves all extremities spontaneously. Psych:  Responds to questions appropriately with a normal affect.    Assessment and Plan:  79 y.o. female with h/o CAD s/p remote history of MI in 2002 s/p 2 vessel CABG post stenting in 2002, history of mitral valve repair in 2002 secondary to mitral regurgitation, history of PAF s/p prior TEE/DCCV on Eliquis, atypical atrial flutter s/p ablation on 07/27/2013 s/p MDT PPM, carcinoma of right lung, COPD, HTN, and HLD who presented to [facility] on 8/2 with increased SOB and was found to have PNA, possibly post obstructive and elevated troponin.  1. Elevated troponin: -0.48-->1.83, continue to trend until peaks and trends down -Uncertain if this elevation is secondary to acute ACS vs supply demand ischemia in the setting of her acute respiratory failure 2/2 possible post obstructive pneumonia. Though given changes in echo with newly depressed EF to 30-35% with new wall motion abnormalities along anterior wall when compared to prior studies would have to strongly consider ischemia  -Continue heparin gtt at this time until troponin trend down/48-72 hour treatment  -Would need ischemic evaluation by cardiac catheterization once stable from infection standpoint, perhaps this could be done as an outpatient standpoint, pending how she does, by her primary team  -She is hesitant to move forward with PCI, should this be needed, attempted to explain this to her -Never  with angina, currently without angina   2. CAD s/p CABG: -Stress test in 2008 without ischemia  -Echo as above -Continue heparin gtt as above -Continue aspirin 81 -Once heparin can be stopped would restart Eliquis for her PAF -Continue atenolol 25 mg daily, losartan 25 mg, and Lipitor 20 mg  3. PAF s/p remote TEE/DCCV and atypical atrial flutter s/p ablation 07/27/2013: -Currently in sinus -On heparin gtt as above -Restart Eliquis once heparin gtt has been discontinued -Continue atenolol  -K+ 3.8 -Check TSH  4. Acute respiratory failure with hypoxia and sepsis: -She is tachypneic, tachycardic, and WBC baseline is 2.9-3.1 (currently at 10.1, which is a significant leukocytosis for her given her chemoradiation)   -Secondary to likely postobstructive pneumonia  -Off BiPAP -Continue inhalers, steroids, and ABX per IM -CT chest pending to further evaluate for recurrence of mass  5. HTN:  -Continue meds as above  6. HLD: -Lipitor   Signed, Christell Faith, PA-C Pager: [phone] 04/02/2015, 9:32 AM

## 2015-04-02 NOTE — Progress Notes (Signed)
ANTIBIOTIC CONSULT NOTE - FOLLOW UP  Pharmacy Consult for Vancomycin/Zosyn Dosing Indication: pneumonia  Allergies  Allergen Reactions  . Lovenox [Enoxaparin Sodium] Itching  . Meperidine Other (See Comments)    Other Reaction: pt does not like how it makes her feel    Patient Measurements: Height: '5\' 7"'$  (170.2 cm) Weight: 155 lb (70.308 kg) IBW/kg (Calculated) : 61.6 Adjusted Body Weight: 65.1 kg  Vital Signs: Temp: 98.6 F (37 C) (08/03 1953) Temp Source: Oral (08/03 1953) BP: 96/64 mmHg (08/03 1600) Pulse Rate: 81 (08/03 1600) Intake/Output from previous day: 08/02 0701 - 08/03 0700 In: 402.1 [I.V.:302.1; IV Piggyback:100] Out: 1300 [Urine:1300] Intake/Output from this shift:    Labs:  Recent Labs  04/01/15 0822 04/02/15 1254  WBC 10.1 9.9  HGB 12.2 10.3*  PLT 296 210  CREATININE 1.11*  --    Estimated Creatinine Clearance: 41.3 mL/min (by C-G formula based on Cr of 1.11). No results for input(s): VANCOTROUGH, VANCOPEAK, VANCORANDOM, GENTTROUGH, GENTPEAK, GENTRANDOM, TOBRATROUGH, TOBRAPEAK, TOBRARND, AMIKACINPEAK, AMIKACINTROU, AMIKACIN in the last 72 hours.   Microbiology: Recent Results (from the past 720 hour(s))  Culture, blood (routine x 2)     Status: None (Preliminary result)   Collection Time: 04/01/15  8:22 AM  Result Value Ref Range Status   Specimen Description BLOOD RIGHT HAND  Final   Special Requests   Final    BOTTLES DRAWN AEROBIC AND ANAEROBIC 2CC ANAEROBIC, 5CC AEROBIC   Culture NO GROWTH 1 DAY  Final   Report Status PENDING  Incomplete  Culture, blood (routine x 2)     Status: None (Preliminary result)   Collection Time: 04/01/15  8:27 AM  Result Value Ref Range Status   Specimen Description BLOOD RIGHT HAND  Final   Special Requests   Final    BOTTLES DRAWN AEROBIC AND ANAEROBIC 3CC ANAEROBIC,5CC AEROBIC   Culture NO GROWTH 1 DAY  Final   Report Status PENDING  Incomplete  MRSA PCR Screening     Status: None   Collection Time:  04/01/15  2:04 PM  Result Value Ref Range Status   MRSA by PCR NEGATIVE NEGATIVE Final    Comment:        The GeneXpert MRSA Assay (FDA approved for NASAL specimens only), is one component of a comprehensive MRSA colonization surveillance program. It is not intended to diagnose MRSA infection nor to guide or monitor treatment for MRSA infections.   Culture, blood (routine x 2) Call MD if unable to obtain prior to antibiotics being given     Status: None   Collection Time: 04/01/15  2:25 PM  Result Value Ref Range Status   Specimen Description Blood  Final   Special Requests NONE  Final   Report Status 04/01/2015 FINAL  Final  Culture, blood (routine x 2) Call MD if unable to obtain prior to antibiotics being given     Status: None   Collection Time: 04/01/15  2:51 PM  Result Value Ref Range Status   Specimen Description Blood  Final   Special Requests NONE  Final   Report Status 04/01/2015 FINAL  Final    Medical History: Past Medical History  Diagnosis Date  . HTN (hypertension)   . Pacemaker     a. MDT 2002; b. generator replacement 2013; c. followed by Dr. Omelia Blackwater, MD  . HLD (hyperlipidemia)   . CAD (coronary artery disease)     a. s/p MI x 2 in 2002 s/p PCI x 2 in 2002; b. s/p  2v CABG 2002; c. stress echo 07/2004 w/ evidence of pos & inf infarct & no evidence of ischemia; d. 4/08 dipyridamold scan w/ multiple areas of infarct, no ischemia, EF 49%     . Lung cancer   . History of colonoscopy 2013  . History of mammography, screening 2015  . History of Papanicolaou smear of cervix 2013  . Carcinoma of right lung 01/03/2015    a. followed by Dr. Oliva Bustard  . Mitral regurgitation     a. s/p mitral ring placement 09/2000; b. echo 09/2010: EF 50%, inf HK, post AK, mild MR, prosthetic mitral valve ring w/ peak gradient of 10 mmHg; b. echo 2/13: EF 50%, mild MR/TR     . History of blood clots     12/2001  . Atypical atrial flutter     a. s/p ablation 07/27/2013  . PAF (paroxysmal  atrial fibrillation)     a. on Eliquis   . COPD (chronic obstructive pulmonary disease)     Medications:  Scheduled:  . antiseptic oral rinse  7 mL Mouth Rinse q12n4p  . aspirin  81 mg Oral Daily  . atorvastatin  20 mg Oral Daily  . azelastine  2 spray Each Nare BID  . budesonide (PULMICORT) nebulizer solution  0.5 mg Nebulization BID  . calcium-vitamin D  1 tablet Oral BID WC  . chlorhexidine  15 mL Mouth Rinse BID  . ipratropium-albuterol  3 mL Nebulization Q4H  . levothyroxine  75 mcg Oral QAC breakfast  . loratadine  10 mg Oral Daily  . methylPREDNISolone (SOLU-MEDROL) injection  40 mg Intravenous Q24H  . pantoprazole  40 mg Oral Daily  . piperacillin-tazobactam (ZOSYN)  IV  3.375 g Intravenous 3 times per day  . vancomycin  1,000 mg Intravenous Q24H   Infusions:  . heparin 950 Units/hr (04/02/15 1517)  . norepinephrine     Assessment: Patient is a 79 yo female admitted with sepsis/pneumonia.  No H/P at this time.   SCr: 1.11, est CrCl~41.3 mL/min, ke: 0.039, t1/2: 17.8 h, Vd: 45.5 L  Goal of Therapy:  Vancomycin trough level 15-20 mcg/ml  Plan:  Patient with history of breast cancer receiving chemo.     1. Continue Vancomycin 1 gm IV q24h.Trough ordered prior to 4th dose on 8/5.  2. Will continue Zosyn 3.375 gm IV q8h per EI protocol based on renal function.   Will order SCr in AM to monitor for toxicity and assess need for dosing change.   Measure antibiotic drug levels at steady state Follow up culture results   Pharmacy will continue to follow.  Randye Treichler G 04/02/2015,9:29 PM

## 2015-04-02 NOTE — Progress Notes (Addendum)
ANTICOAGULATION CONSULT NOTE - Follow Up Consult  Pharmacy Consult for Heparin Indication: chest pain/ACS  Allergies  Allergen Reactions  . Lovenox [Enoxaparin Sodium] Itching  . Meperidine Other (See Comments)    Other Reaction: pt does not like how it makes her feel    Patient Measurements: Height: '5\' 7"'$  (170.2 cm) Weight: 155 lb (70.308 kg) IBW/kg (Calculated) : 61.6 Heparin Dosing Weight: 70.3 kg  Vital Signs: Temp: 98.6 F (37 C) (08/03 1953) Temp Source: Oral (08/03 1953) BP: 96/64 mmHg (08/03 1600) Pulse Rate: 81 (08/03 1600)  Labs:  Recent Labs  04/01/15 0822 04/01/15 1210 04/01/15 2042 04/01/15 2044 04/02/15 1254 04/02/15 2110  HGB 12.2  --   --   --  10.3*  --   HCT 38.1  --   --   --  30.8*  --   PLT 296  --   --   --  210  --   APTT  --   --   --  32 52* 60*  LABPROT  --   --   --  18.7*  --   --   INR  --   --   --  1.54  --   --   HEPARINUNFRC  --   --  >3.60*  --  2.86*  --   CREATININE 1.11*  --   --   --   --   --   TROPONINI 0.48* 1.83*  --   --   --   --     Estimated Creatinine Clearance: 41.3 mL/min (by C-G formula based on Cr of 1.11).   Medical History: Past Medical History  Diagnosis Date  . HTN (hypertension)   . Pacemaker     a. MDT 2002; b. generator replacement 2013; c. followed by Dr. Omelia Blackwater, MD  . HLD (hyperlipidemia)   . CAD (coronary artery disease)     a. s/p MI x 2 in 2002 s/p PCI x 2 in 2002; b. s/p 2v CABG 2002; c. stress echo 07/2004 w/ evidence of pos & inf infarct & no evidence of ischemia; d. 4/08 dipyridamold scan w/ multiple areas of infarct, no ischemia, EF 49%     . Lung cancer   . History of colonoscopy 2013  . History of mammography, screening 2015  . History of Papanicolaou smear of cervix 2013  . Carcinoma of right lung 01/03/2015    a. followed by Dr. Oliva Bustard  . Mitral regurgitation     a. s/p mitral ring placement 09/2000; b. echo 09/2010: EF 50%, inf HK, post AK, mild MR, prosthetic mitral valve ring w/  peak gradient of 10 mmHg; b. echo 2/13: EF 50%, mild MR/TR     . History of blood clots     12/2001  . Atypical atrial flutter     a. s/p ablation 07/27/2013  . PAF (paroxysmal atrial fibrillation)     a. on Eliquis   . COPD (chronic obstructive pulmonary disease)     Assessment: 79 yo female with NSTEMI starting heparin drip. Pt last took dose of apixaban at 1649. Spoke with admitting MD, pt is fully anticoagulated at this time with apixaban, will start heparin at 5 am (~12 h after last apixaban dose).  Hgb 12.2, Plt 296 INR 1.54, aPTT 32  APTT at 1254: 52 HL at 1254: 2.86   Goal of Therapy:  Heparin level 0.3-0.7 units/ml Monitor platelets by anticoagulation protocol: Yes  APTT goal: 68-109   Plan:  HL elevated  due to prior apixaban use.  Will dose heparin drip using aPTT levels.  Will increase drip rate to 9.5 mL/hr based on aPTT of 52 and recheck aPTT in 6 hours.  Will recheck HL and CBC in AM.  Once HL and aPTT correlate, may start using HL to assess dosing.   8/3:  APTT @ 21:30 = 60 Will increase Heparin drip rate to 1050 units/hr and recheck aPTT 6 hrs after rate change on 8/4 @ 4:00.   Maritta Kief D 04/02/2015,9:59 PM

## 2015-04-03 DIAGNOSIS — I471 Supraventricular tachycardia: Secondary | ICD-10-CM | POA: Insufficient documentation

## 2015-04-03 LAB — STREPTOCOCCUS PNEUMONIAE AG (CSF): Streptococcus Pneumoniae Ag: NEGATIVE

## 2015-04-03 LAB — LEGIONELLA PNEUMOPHILA SEROGP 1 UR AG: L. pneumophila Serogp 1 Ur Ag: NEGATIVE

## 2015-04-03 LAB — CBC
HCT: 30.5 % — ABNORMAL LOW (ref 35.0–47.0)
Hemoglobin: 9.9 g/dL — ABNORMAL LOW (ref 12.0–16.0)
MCH: 30.5 pg (ref 26.0–34.0)
MCHC: 32.5 g/dL (ref 32.0–36.0)
MCV: 93.8 fL (ref 80.0–100.0)
PLATELETS: 204 10*3/uL (ref 150–440)
RBC: 3.26 MIL/uL — AB (ref 3.80–5.20)
RDW: 17.7 % — AB (ref 11.5–14.5)
WBC: 9 10*3/uL (ref 3.6–11.0)

## 2015-04-03 LAB — APTT
aPTT: 114 seconds — ABNORMAL HIGH (ref 24–36)
aPTT: 25 seconds (ref 24–36)

## 2015-04-03 LAB — CREATININE, SERUM
Creatinine, Ser: 0.87 mg/dL (ref 0.44–1.00)
GFR calc Af Amer: >60 mL/min (ref >60)

## 2015-04-03 LAB — TROPONIN I: TROPONIN I: 0.47 ng/mL — AB (ref <0.031)

## 2015-04-03 LAB — HEPARIN LEVEL (UNFRACTIONATED): HEPARIN UNFRACTIONATED: 1.4 [IU]/mL — AB (ref 0.30–0.70)

## 2015-04-03 LAB — TSH: TSH: 0.679 u[IU]/mL (ref 0.350–4.500)

## 2015-04-03 MED ORDER — VANCOMYCIN HCL IN DEXTROSE 1-5 GM/200ML-% IV SOLN
1000.0000 mg | INTRAVENOUS | Status: DC
  Administered 2015-04-03 – 2015-04-05 (×3): 1000 mg via INTRAVENOUS
  Filled 2015-04-03 (×5): qty 200

## 2015-04-03 MED ORDER — APIXABAN 5 MG PO TABS
5.0000 mg | ORAL_TABLET | Freq: Two times a day (BID) | ORAL | Status: DC
  Administered 2015-04-03 – 2015-04-07 (×9): 5 mg via ORAL
  Filled 2015-04-03 (×9): qty 1

## 2015-04-03 MED ORDER — ATENOLOL 25 MG PO TABS
25.0000 mg | ORAL_TABLET | Freq: Every day | ORAL | Status: DC
  Administered 2015-04-03 – 2015-04-05 (×3): 25 mg via ORAL
  Filled 2015-04-03 (×3): qty 1

## 2015-04-03 MED ORDER — ACETAMINOPHEN 325 MG PO TABS
650.0000 mg | ORAL_TABLET | Freq: Four times a day (QID) | ORAL | Status: DC | PRN
Start: 2015-04-03 — End: 2015-04-07
  Administered 2015-04-03 – 2015-04-06 (×6): 650 mg via ORAL
  Filled 2015-04-03 (×6): qty 2

## 2015-04-03 MED ORDER — DOCUSATE SODIUM 100 MG PO CAPS
100.0000 mg | ORAL_CAPSULE | Freq: Two times a day (BID) | ORAL | Status: DC
  Administered 2015-04-03 – 2015-04-06 (×7): 100 mg via ORAL
  Filled 2015-04-03 (×8): qty 1

## 2015-04-03 NOTE — Progress Notes (Signed)
   04/03/15 0930  Clinical Encounter Type  Visited With Patient and family together  Visit Type Follow-up  Consult/Referral To Chaplain  Spiritual Encounters  Spiritual Needs Emotional  Visited patient & daughter. Provided pastoral care & prayer. Patient in good spirits and appreciates visits. Chap. Monterio Bob G. De Motte

## 2015-04-03 NOTE — Progress Notes (Signed)
Patient with back pain, and complaints of not having bowel movement in 2 days. Page MD, who stated he will enter orders for both.

## 2015-04-03 NOTE — Progress Notes (Signed)
ANTIBIOTIC CONSULT NOTE - FOLLOW UP  Pharmacy Consult for Vancomycin/Zosyn Dosing Indication: pneumonia  Allergies  Allergen Reactions  . Lovenox [Enoxaparin Sodium] Itching  . Meperidine Other (See Comments)    Other Reaction: pt does not like how it makes her feel    Patient Measurements: Height: '5\' 7"'$  (170.2 cm) Weight: 168 lb 10.4 oz (76.5 kg) IBW/kg (Calculated) : 61.6 Adjusted Body Weight: 67.5  kg  Vital Signs: BP: 107/66 mmHg (08/04 0000) Pulse Rate: 81 (08/04 0700) Intake/Output from previous day: 08/03 0701 - 08/04 0700 In: 49.7 [I.V.:49.7] Out: 1700 [Urine:1700] Intake/Output from this shift:    Labs:  Recent Labs  04/01/15 0822 04/02/15 1254 04/03/15 0643  WBC 10.1 9.9 9.0  HGB 12.2 10.3* 9.9*  PLT 296 210 204  CREATININE 1.11*  --  0.87   Estimated Creatinine Clearance: 57.8 mL/min (by C-G formula based on Cr of 0.87). No results for input(s): VANCOTROUGH, VANCOPEAK, VANCORANDOM, GENTTROUGH, GENTPEAK, GENTRANDOM, TOBRATROUGH, TOBRAPEAK, TOBRARND, AMIKACINPEAK, AMIKACINTROU, AMIKACIN in the last 72 hours.   Microbiology: Recent Results (from the past 720 hour(s))  Culture, blood (routine x 2)     Status: None (Preliminary result)   Collection Time: 04/01/15  8:22 AM  Result Value Ref Range Status   Specimen Description BLOOD RIGHT HAND  Final   Special Requests   Final    BOTTLES DRAWN AEROBIC AND ANAEROBIC 2CC ANAEROBIC, 5CC AEROBIC   Culture NO GROWTH 1 DAY  Final   Report Status PENDING  Incomplete  Culture, blood (routine x 2)     Status: None (Preliminary result)   Collection Time: 04/01/15  8:27 AM  Result Value Ref Range Status   Specimen Description BLOOD RIGHT HAND  Final   Special Requests   Final    BOTTLES DRAWN AEROBIC AND ANAEROBIC 3CC ANAEROBIC,5CC AEROBIC   Culture NO GROWTH 1 DAY  Final   Report Status PENDING  Incomplete  MRSA PCR Screening     Status: None   Collection Time: 04/01/15  2:04 PM  Result Value Ref Range Status    MRSA by PCR NEGATIVE NEGATIVE Final    Comment:        The GeneXpert MRSA Assay (FDA approved for NASAL specimens only), is one component of a comprehensive MRSA colonization surveillance program. It is not intended to diagnose MRSA infection nor to guide or monitor treatment for MRSA infections.   Culture, blood (routine x 2) Call MD if unable to obtain prior to antibiotics being given     Status: None   Collection Time: 04/01/15  2:25 PM  Result Value Ref Range Status   Specimen Description Blood  Final   Special Requests NONE  Final   Report Status 04/01/2015 FINAL  Final  Culture, blood (routine x 2) Call MD if unable to obtain prior to antibiotics being given     Status: None   Collection Time: 04/01/15  2:51 PM  Result Value Ref Range Status   Specimen Description Blood  Final   Special Requests NONE  Final   Report Status 04/01/2015 FINAL  Final    Medical History: Past Medical History  Diagnosis Date  . HTN (hypertension)   . Pacemaker     a. MDT 2002; b. generator replacement 2013; c. followed by Dr. Omelia Blackwater, MD  . HLD (hyperlipidemia)   . CAD (coronary artery disease)     a. s/p MI x 2 in 2002 s/p PCI x 2 in 2002; b. s/p 2v CABG 2002; c. stress  echo 07/2004 w/ evidence of pos & inf infarct & no evidence of ischemia; d. 4/08 dipyridamold scan w/ multiple areas of infarct, no ischemia, EF 49%     . Lung cancer   . History of colonoscopy 2013  . History of mammography, screening 2015  . History of Papanicolaou smear of cervix 2013  . Carcinoma of right lung 01/03/2015    a. followed by Dr. Oliva Bustard  . Mitral regurgitation     a. s/p mitral ring placement 09/2000; b. echo 09/2010: EF 50%, inf HK, post AK, mild MR, prosthetic mitral valve ring w/ peak gradient of 10 mmHg; b. echo 2/13: EF 50%, mild MR/TR     . History of blood clots     12/2001  . Atypical atrial flutter     a. s/p ablation 07/27/2013  . PAF (paroxysmal atrial fibrillation)     a. on Eliquis   . COPD  (chronic obstructive pulmonary disease)     Medications:  Scheduled:  . antiseptic oral rinse  7 mL Mouth Rinse q12n4p  . aspirin  81 mg Oral Daily  . atorvastatin  20 mg Oral Daily  . azelastine  2 spray Each Nare BID  . budesonide (PULMICORT) nebulizer solution  0.5 mg Nebulization BID  . calcium-vitamin D  1 tablet Oral BID WC  . chlorhexidine  15 mL Mouth Rinse BID  . ipratropium-albuterol  3 mL Nebulization Q4H  . levothyroxine  75 mcg Oral QAC breakfast  . loratadine  10 mg Oral Daily  . methylPREDNISolone (SOLU-MEDROL) injection  40 mg Intravenous Q24H  . pantoprazole  40 mg Oral Daily  . piperacillin-tazobactam (ZOSYN)  IV  3.375 g Intravenous 3 times per day  . vancomycin  1,000 mg Intravenous Q18H   Infusions:  . heparin 1,050 Units/hr (04/02/15 2202)  . norepinephrine     Assessment: Patient is a 79 yo female admitted with sepsis/pneumonia.  Patient started empirically on Vancomycin and Zosyn IV for bilateral as well as postobstructive pneumonia. Renal function improved from admission  SCr: 0.87, est CrCl~57.8 mL/min, ke: 0.052, t1/2: 13.3 h, Vd: 47.6 L  Goal of Therapy:  Vancomycin trough level 15-20 mcg/ml  Plan:  Patient with history of breast cancer receiving chemo.     1. Will transition patient to Vancomycin 1 gm IV q18h based on improvement in renal function.  Trough ordered prior to 4th dose on 8/6 at 0130.  2. Will continue Zosyn 3.375 gm IV q8h per EI protocol based on renal function.   Follow cultures/sensitivities  Pharmacy will continue to follow.  Chadd Tollison G 04/03/2015,8:04 AM

## 2015-04-03 NOTE — Care Management Important Message (Signed)
Important Message  Patient Details  Name: Jenny Cooper MRN: [id number] Date of Birth: August 12, 1936   Medicare Important Message Given:  Yes-second notification given    Juliann Pulse A Allmond 04/03/2015, 10:46 AM

## 2015-04-03 NOTE — Progress Notes (Signed)
ANTICOAGULATION CONSULT NOTE - Follow Up Consult  Pharmacy Consult for Heparin Indication: chest pain/ACS  Allergies  Allergen Reactions  . Lovenox [Enoxaparin Sodium] Itching  . Meperidine Other (See Comments)    Other Reaction: pt does not like how it makes her feel    Patient Measurements: Height: '5\' 7"'$  (170.2 cm) Weight: 168 lb 10.4 oz (76.5 kg) IBW/kg (Calculated) : 61.6 Heparin Dosing Weight: 70.3 kg  Vital Signs: BP: 107/66 mmHg (08/04 0000) Pulse Rate: 81 (08/04 0700)  Labs:  Recent Labs  04/01/15 0822 04/01/15 1210 04/01/15 2042  04/01/15 2044 04/02/15 1254 04/02/15 2110 04/03/15 0643  HGB 12.2  --   --   --   --  10.3*  --  9.9*  HCT 38.1  --   --   --   --  30.8*  --  30.5*  PLT 296  --   --   --   --  210  --  204  APTT  --   --   --   < > 32 52* 60* 114*  LABPROT  --   --   --   --  18.7*  --   --   --   INR  --   --   --   --  1.54  --   --   --   HEPARINUNFRC  --   --  >3.60*  --   --  2.86*  --   --   CREATININE 1.11*  --   --   --   --   --   --  0.87  TROPONINI 0.48* 1.83*  --   --   --   --   --   --   < > = values in this interval not displayed.  Estimated Creatinine Clearance: 57.8 mL/min (by C-G formula based on Cr of 0.87).   Medical History: Past Medical History  Diagnosis Date  . HTN (hypertension)   . Pacemaker     a. MDT 2002; b. generator replacement 2013; c. followed by Dr. Omelia Blackwater, MD  . HLD (hyperlipidemia)   . CAD (coronary artery disease)     a. s/p MI x 2 in 2002 s/p PCI x 2 in 2002; b. s/p 2v CABG 2002; c. stress echo 07/2004 w/ evidence of pos & inf infarct & no evidence of ischemia; d. 4/08 dipyridamold scan w/ multiple areas of infarct, no ischemia, EF 49%     . Lung cancer   . History of colonoscopy 2013  . History of mammography, screening 2015  . History of Papanicolaou smear of cervix 2013  . Carcinoma of right lung 01/03/2015    a. followed by Dr. Oliva Bustard  . Mitral regurgitation     a. s/p mitral ring placement  09/2000; b. echo 09/2010: EF 50%, inf HK, post AK, mild MR, prosthetic mitral valve ring w/ peak gradient of 10 mmHg; b. echo 2/13: EF 50%, mild MR/TR     . History of blood clots     12/2001  . Atypical atrial flutter     a. s/p ablation 07/27/2013  . PAF (paroxysmal atrial fibrillation)     a. on Eliquis   . COPD (chronic obstructive pulmonary disease)     Assessment: 79 yo female with NSTEMI starting heparin drip. Pt last took dose of apixaban at 1649. Spoke with admitting MD, pt is fully anticoagulated at this time with apixaban, will start heparin at 5 am (~12 h after last apixaban  dose).  Hgb 12.2, Plt 296 INR 1.54, aPTT 32  8/3:APTT at 1254: 52 8/3: HL at 1254: 2.86  8/4: aPTT at 0643: 114 8/4: HL pending    Goal of Therapy:  Heparin level 0.3-0.7 units/ml Monitor platelets by anticoagulation protocol: Yes  APTT goal: 68-109   Plan:  HL elevated due to prior apixaban use.  Will dose heparin drip using aPTT levels.  Will increase drip rate to 9.5 mL/hr based on aPTT of 52 and recheck aPTT in 6 hours.  Will recheck HL and CBC in AM.  Once HL and aPTT correlate, may start using HL to assess dosing.   8/4: aPTT at 0643 of 114.  APTT outside of goal range.  Will decrease heparin drip rate to 950 units/hr (9.5 mL/hr) and recheck an aPTT at 1400.  HL pending for today.  Will order CBC in AM   Alajah Witman G 04/03/2015,7:57 AM

## 2015-04-03 NOTE — Progress Notes (Signed)
Patient: Jenny Cooper / Admit Date: 04/01/2015 / Date of Encounter: 04/03/2015, 8:08 AM   Subjective: Feels well this morning. Troponin pending this AM. CBC trending down to 9.0. SCr 0.87.   Review of Systems: Review of Systems  Constitutional: Positive for weight loss and malaise/fatigue. Negative for fever, chills and diaphoresis.  HENT: Positive for congestion.   Eyes: Negative for blurred vision, discharge and redness.  Respiratory: Positive for cough, sputum production, shortness of breath and wheezing. Negative for hemoptysis.   Cardiovascular: Negative for chest pain, palpitations, orthopnea, claudication, leg swelling and PND.  Gastrointestinal: Negative for heartburn, nausea, vomiting, blood in stool and melena.  Genitourinary: Negative for hematuria.  Musculoskeletal: Negative for myalgias and falls.  Skin: Negative for rash.  Neurological: Positive for weakness. Negative for sensory change, speech change, focal weakness and headaches.  Endo/Heme/Allergies: Does not bruise/bleed easily.  Psychiatric/Behavioral: The patient is nervous/anxious.   All other systems reviewed and are negative.    Objective: Telemetry: NSR, 80's to 90's  Physical Exam: Blood pressure 107/66, pulse 81, temperature 98.6 F (37 C), temperature source Oral, resp. rate 17, height '5\' 7"'$  (1.702 m), weight 168 lb 10.4 oz (76.5 kg), SpO2 96 %. Body mass index is 26.41 kg/(m^2). General: Well developed, well nourished, in no acute distress. Head: Normocephalic, atraumatic, sclera non-icteric, no xanthomas, nares are without discharge. Neck: Negative for carotid bruits. JVP not elevated. Lungs: Improved breath sounds bilaterally. Breathing is unlabored. Heart: RRR S1 S2, II/VI systolic murmurs. No rubs, or gallops.  Abdomen: Soft, non-tender, non-distended with normoactive bowel sounds. No rebound/guarding. Extremities: No clubbing or cyanosis. No edema. Distal pedal pulses are 2+ and equal  bilaterally. Neuro: Alert and oriented X 3. Moves all extremities spontaneously. Psych:  Responds to questions appropriately with a normal affect.   Intake/Output Summary (Last 24 hours) at 04/03/15 0808 Last data filed at 04/03/15 0533  Gross per 24 hour  Intake  49.73 ml  Output   1700 ml  Net -1650.27 ml    Inpatient Medications:  . antiseptic oral rinse  7 mL Mouth Rinse q12n4p  . aspirin  81 mg Oral Daily  . atorvastatin  20 mg Oral Daily  . azelastine  2 spray Each Nare BID  . budesonide (PULMICORT) nebulizer solution  0.5 mg Nebulization BID  . calcium-vitamin D  1 tablet Oral BID WC  . chlorhexidine  15 mL Mouth Rinse BID  . ipratropium-albuterol  3 mL Nebulization Q4H  . levothyroxine  75 mcg Oral QAC breakfast  . loratadine  10 mg Oral Daily  . methylPREDNISolone (SOLU-MEDROL) injection  40 mg Intravenous Q24H  . pantoprazole  40 mg Oral Daily  . piperacillin-tazobactam (ZOSYN)  IV  3.375 g Intravenous 3 times per day  . vancomycin  1,000 mg Intravenous Q18H   Infusions:  . heparin 1,050 Units/hr (04/02/15 2202)  . norepinephrine      Labs:  Recent Labs  04/01/15 0822 04/03/15 0643  NA 139  --   K 3.8  --   CL 98*  --   CO2 28  --   GLUCOSE 327*  --   BUN 22*  --   CREATININE 1.11* 0.87  CALCIUM 9.0  --     Recent Labs  04/01/15 0822  AST 30  ALT 16  ALKPHOS 72  BILITOT 0.6  PROT 7.4  ALBUMIN 4.0    Recent Labs  04/01/15 0822 04/02/15 1254 04/03/15 0643  WBC 10.1 9.9 9.0  NEUTROABS 7.1*  --   --  HGB 12.2 10.3* 9.9*  HCT 38.1 30.8* 30.5*  MCV 94.9 93.4 93.8  PLT 296 210 204    Recent Labs  04/01/15 0822 04/01/15 1210  TROPONINI 0.48* 1.83*   Invalid input(s): POCBNP No results for input(s): HGBA1C in the last 72 hours.   Weights: Filed Weights   04/01/15 0810 04/03/15 0532  Weight: 155 lb (70.308 kg) 168 lb 10.4 oz (76.5 kg)     Radiology/Studies:  Ct Chest Wo Contrast  04/02/2015   CLINICAL DATA:  History of lung  cancer. Postobstructive pneumonia, possible sepsis. Shortness of breath and cough for last 2-3 weeks.  EXAM: CT CHEST WITHOUT CONTRAST  TECHNIQUE: Multidetector CT imaging of the chest was performed following the standard protocol without IV contrast.  COMPARISON:  Chest CT 09/25/2014, 09/17/2014.  FINDINGS: There are small bilateral pleural effusions. Previously seen central right lung mass involving the right mainstem bronchus is difficult to separate from adjacent vasculature on this noncontrast study. There are right perihilar airspace opacities. This could reflect pneumonia. There is mild narrowing of the right mainstem bronchus and adjacent airways without complete obstruction. Ground-glass opacities are noted in the left lower lobe and lingula, also likely inflammatory.  Mild underlying COPD. Heart is mildly enlarged. Pacer wires noted in the right heart. Prior mitral valve replacement. Coronary artery and aortic calcifications. No evidence of aortic aneurysm.  No visible mediastinal or axillary adenopathy.  Chest wall soft tissues are unremarkable. Imaging into the upper abdomen shows no acute findings.  IMPRESSION: Mild narrowing of the right mainstem bronchus distally and adjacent airways. It is difficult to determine scratch head is difficult to evaluate central mass seen on prior CT given the lack of intravenous contrast. There are airspace opacities in the right perihilar region including right lower lobe and right middle lobe most compatible with pneumonia.  Patchy ground-glass opacities throughout the left lung, also likely infectious/ inflammatory.  Small bilateral pleural effusions.   Electronically Signed   By: Rolm Baptise M.D.   On: 04/02/2015 10:02   Dg Chest Port 1 View  04/01/2015   CLINICAL DATA:  Respiratory distress. History of lung cancer on chemotherapy  EXAM: PORTABLE CHEST - 1 VIEW  COMPARISON:  09/25/2014  FINDINGS: Port-A-Cath tip in the SVC. Changes of CABG and mitral valve  replacement. Dual lead pacemaker unchanged.  Progression of bilateral airspace disease in the lower lobes, right greater than left. There is extensive airspace disease on the right. This is concerning for recurrent neoplasm. Differential does include pneumonia. Small bilateral effusions.  IMPRESSION: Bibasilar airspace disease right greater than left has progressed significantly from the prior study. This is worrisome for recurrent carcinoma however pneumonia could also have this appearance. If the patient does not have current symptoms of pneumonia, CT chest with contrast recommended for further evaluation.   Electronically Signed   By: Franchot Gallo M.D.   On: 04/01/2015 08:40     Assessment and Plan  79 y.o. female with h/o CAD s/p remote history of MI in 2002 s/p 2 vessel CABG post stenting in 2002, history of mitral valve repair in 2002 secondary to mitral regurgitation, history of PAF s/p prior TEE/DCCV on Eliquis, atypical atrial flutter s/p ablation on 07/27/2013 s/p MDT PPM, carcinoma of right lung, COPD, HTN, and HLD who presented to [facility] on 8/2 with increased SOB and was found to have PNA, possibly post obstructive and elevated troponin.  1. Elevated troponin: -0.48-->1.83, pending this morning -Uncertain if this elevation is secondary to acute  ACS vs supply demand ischemia in the setting of her acute respiratory failure 2/2 possible post obstructive pneumonia. Though given changes in echo with newly depressed EF to 30-35% with new wall motion abnormalities along anterior wall when compared to prior studies would have to strongly consider ischemia  -Continue heparin gtt at this time until troponin trend down/48-72 hour treatment  -Would need ischemic evaluation by cardiac catheterization once stable from infection standpoint, perhaps this could be done as an outpatient standpoint, pending how she does, by her primary team  -She is hesitant to move forward with PCI, should this be needed,  attempted to explain this to her -Never with angina, currently without angina   2. CAD s/p CABG: -Stress test in 2008 without ischemia  -Echo as above -Continue heparin gtt as above -Continue aspirin 81 -Once heparin can be stopped would restart Eliquis for her PAF, await troponin this AM -Continue atenolol 25 mg daily, losartan 25 mg, and Lipitor 20 mg  3. PAF s/p remote TEE/DCCV and atypical atrial flutter s/p ablation 07/27/2013: -Currently in sinus -On heparin gtt as above -Restart Eliquis once heparin gtt has been discontinued -Continue atenolol  -K+ 3.8 -Check TSH  4. Acute respiratory failure with hypoxia and sepsis: -She is tachypneic, tachycardic, and WBC baseline is 2.9-3.1 (currently at 10.1, which is a significant leukocytosis for her given her chemoradiation)  -Secondary to likely postobstructive pneumonia  -Off BiPAP -Continue inhalers, steroids, and ABX per IM -CT chest pending to further evaluate for recurrence of mass  5. HTN:  -Continue meds as above  6. HLD: -Lipitor    Signed, Christell Faith, PA-C Pager: [phone] 04/03/2015, 8:08 AM

## 2015-04-03 NOTE — Progress Notes (Signed)
[place] [facility] [place]                                                                                                                                                                                            Patient Demographics   Saroya Riccobono, is a 79 y.o. female, DOB - 1935-12-29, CHE:[id number]  Admit date - 04/01/2015   Admitting Physician Max Sane, MD  Outpatient Primary MD for the patient is ALDRIDGE,Brynne, MD   LOS - 2  Subjective: Shortness of breath continues to improve. Denies any chest pains   Review of Systems:   CONSTITUTIONAL: No documented fever. Positive fatigue, weakness. No weight gain, no weight loss.  EYES: No blurry or double vision.  ENT: No tinnitus. No postnasal drip. No redness of the oropharynx.  RESPIRATORY: Dry cough, no wheeze, no hemoptysis. Positive dyspnea.  CARDIOVASCULAR: No chest pain. No orthopnea. No palpitations. No syncope.  GASTROINTESTINAL: No nausea, no vomiting or diarrhea. No abdominal pain. No melena or hematochezia.  GENITOURINARY: No dysuria or hematuria.  ENDOCRINE: No polyuria or nocturia. No heat or cold intolerance.  HEMATOLOGY: No anemia. No bruising. No bleeding.  INTEGUMENTARY: No rashes. No lesions.  MUSCULOSKELETAL: No arthritis. No swelling. No gout.  NEUROLOGIC: No numbness, tingling, or ataxia. No seizure-type activity.  PSYCHIATRIC: No anxiety. No insomnia. No ADD.    Vitals:   Filed Vitals:   04/03/15 1000 04/03/15 1015 04/03/15 1100 04/03/15 1142  BP: 130/77 130/77 134/88   Pulse: 79 93 81   Temp:      TempSrc:      Resp: 20  19   Height:      Weight:      SpO2: 97%  97% 98%    Wt Readings from Last 3 Encounters:  04/03/15 76.5 kg (168 lb 10.4 oz)  03/05/15 72.5 kg (159 lb 13.3 oz)  02/26/15 71.9 kg (158 lb 8.2 oz)     Intake/Output Summary (Last 24 hours) at 04/03/15 1215 Last data filed at 04/03/15 1014  Gross per 24 hour  Intake 572.67 ml  Output   1700  ml  Net -1127.33 ml    Physical Exam:   GENERAL: Pleasant-appearing in no apparent distress.  HEAD, EYES, EARS, NOSE AND THROAT: Atraumatic, normocephalic. Extraocular muscles are intact. Pupils equal and reactive to light. Sclerae anicteric. No conjunctival injection. No oro-pharyngeal erythema.  NECK: Supple. There is no jugular venous distention. No bruits, no lymphadenopathy, no thyromegaly.  HEART: Regular rate and rhythm,. No murmurs, no rubs, no clicks.  LUNGS: Bilateral rhonchi, no wheezing  ABDOMEN: Soft, flat, nontender, nondistended. Has good  bowel sounds. No hepatosplenomegaly appreciated.  EXTREMITIES: No evidence of any cyanosis, clubbing, or peripheral edema.  +2 pedal and radial pulses bilaterally.  NEUROLOGIC: The patient is alert, awake, and oriented x3 with no focal motor or sensory deficits appreciated bilaterally.  SKIN: Moist and warm with no rashes appreciated.  Psych: Not anxious, depressed LN: No inguinal LN enlargement    Antibiotics   Anti-infectives    Start     Dose/Rate Route Frequency Ordered Stop   04/03/15 1400  vancomycin (VANCOCIN) IVPB 1000 mg/200 mL premix     1,000 mg 200 mL/hr over 60 Minutes Intravenous Every 18 hours 04/03/15 0804     04/02/15 2030  vancomycin (VANCOCIN) IVPB 1000 mg/200 mL premix  Status:  Discontinued     1,000 mg 200 mL/hr over 60 Minutes Intravenous Every 24 hours 04/01/15 1448 04/01/15 1448   04/02/15 2030  vancomycin (VANCOCIN) IVPB 1000 mg/200 mL premix  Status:  Discontinued     1,000 mg 200 mL/hr over 60 Minutes Intravenous Every 24 hours 04/01/15 1448 04/03/15 0804   04/02/15 0800  azithromycin (ZITHROMAX) 500 mg in dextrose 5 % 250 mL IVPB  Status:  Discontinued     500 mg 250 mL/hr over 60 Minutes Intravenous Every 24 hours 04/01/15 1419 04/01/15 1540   04/01/15 2030  vancomycin (VANCOCIN) IVPB 1000 mg/200 mL premix     1,000 mg 200 mL/hr over 60 Minutes Intravenous  Once 04/01/15 1448 04/01/15 2345   04/01/15  1545  piperacillin-tazobactam (ZOSYN) IVPB 3.375 g     3.375 g 12.5 mL/hr over 240 Minutes Intravenous 3 times per day 04/01/15 1540     04/01/15 1500  cefTRIAXone (ROCEPHIN) 1 g in dextrose 5 % 50 mL IVPB  Status:  Discontinued     1 g 100 mL/hr over 30 Minutes Intravenous Every 24 hours 04/01/15 1419 04/01/15 1540   04/01/15 1033  piperacillin-tazobactam (ZOSYN) 3.375 (3-0.375) G injection    Comments:  COTRONE, JILL: cabinet override      04/01/15 1033 04/01/15 1033   04/01/15 0930  vancomycin (VANCOCIN) IVPB 1000 mg/200 mL premix     1,000 mg 200 mL/hr over 60 Minutes Intravenous  Once 04/01/15 0918 04/01/15 1026   04/01/15 0930  levofloxacin (LEVAQUIN) IVPB 750 mg     750 mg 100 mL/hr over 90 Minutes Intravenous  Once 04/01/15 0918 04/01/15 1051   04/01/15 0930  piperacillin-tazobactam (ZOSYN) IVPB 3.375 g     3.375 g 100 mL/hr over 30 Minutes Intravenous  Once 04/01/15 0918 04/01/15 1102      Medications   Scheduled Meds: . antiseptic oral rinse  7 mL Mouth Rinse q12n4p  . apixaban  5 mg Oral BID  . aspirin  81 mg Oral Daily  . atenolol  25 mg Oral Daily  . atorvastatin  20 mg Oral Daily  . azelastine  2 spray Each Nare BID  . budesonide (PULMICORT) nebulizer solution  0.5 mg Nebulization BID  . calcium-vitamin D  1 tablet Oral BID WC  . chlorhexidine  15 mL Mouth Rinse BID  . ipratropium-albuterol  3 mL Nebulization Q4H  . levothyroxine  75 mcg Oral QAC breakfast  . loratadine  10 mg Oral Daily  . methylPREDNISolone (SOLU-MEDROL) injection  40 mg Intravenous Q24H  . pantoprazole  40 mg Oral Daily  . piperacillin-tazobactam (ZOSYN)  IV  3.375 g Intravenous 3 times per day  . vancomycin  1,000 mg Intravenous Q18H   Continuous Infusions:   PRN Meds:.  Data Review:   Micro Results Recent Results (from the past 240 hour(s))  Culture, blood (routine x 2)     Status: None (Preliminary result)   Collection Time: 04/01/15  8:22 AM  Result Value Ref Range Status    Specimen Description BLOOD RIGHT HAND  Final   Special Requests   Final    BOTTLES DRAWN AEROBIC AND ANAEROBIC 2CC ANAEROBIC, 5CC AEROBIC   Culture NO GROWTH 2 DAYS  Final   Report Status PENDING  Incomplete  Culture, blood (routine x 2)     Status: None (Preliminary result)   Collection Time: 04/01/15  8:27 AM  Result Value Ref Range Status   Specimen Description BLOOD RIGHT HAND  Final   Special Requests   Final    BOTTLES DRAWN AEROBIC AND ANAEROBIC 3CC ANAEROBIC,5CC AEROBIC   Culture NO GROWTH 2 DAYS  Final   Report Status PENDING  Incomplete  MRSA PCR Screening     Status: None   Collection Time: 04/01/15  2:04 PM  Result Value Ref Range Status   MRSA by PCR NEGATIVE NEGATIVE Final    Comment:        The GeneXpert MRSA Assay (FDA approved for NASAL specimens only), is one component of a comprehensive MRSA colonization surveillance program. It is not intended to diagnose MRSA infection nor to guide or monitor treatment for MRSA infections.   Culture, blood (routine x 2) Call MD if unable to obtain prior to antibiotics being given     Status: None   Collection Time: 04/01/15  2:25 PM  Result Value Ref Range Status   Specimen Description Blood  Final   Special Requests NONE  Final   Report Status 04/01/2015 FINAL  Final  Culture, blood (routine x 2) Call MD if unable to obtain prior to antibiotics being given     Status: None   Collection Time: 04/01/15  2:51 PM  Result Value Ref Range Status   Specimen Description Blood  Final   Special Requests NONE  Final   Report Status 04/01/2015 FINAL  Final    Radiology Reports Ct Chest Wo Contrast  04/02/2015   CLINICAL DATA:  History of lung cancer. Postobstructive pneumonia, possible sepsis. Shortness of breath and cough for last 2-3 weeks.  EXAM: CT CHEST WITHOUT CONTRAST  TECHNIQUE: Multidetector CT imaging of the chest was performed following the standard protocol without IV contrast.  COMPARISON:  Chest CT 09/25/2014,  09/17/2014.  FINDINGS: There are small bilateral pleural effusions. Previously seen central right lung mass involving the right mainstem bronchus is difficult to separate from adjacent vasculature on this noncontrast study. There are right perihilar airspace opacities. This could reflect pneumonia. There is mild narrowing of the right mainstem bronchus and adjacent airways without complete obstruction. Ground-glass opacities are noted in the left lower lobe and lingula, also likely inflammatory.  Mild underlying COPD. Heart is mildly enlarged. Pacer wires noted in the right heart. Prior mitral valve replacement. Coronary artery and aortic calcifications. No evidence of aortic aneurysm.  No visible mediastinal or axillary adenopathy.  Chest wall soft tissues are unremarkable. Imaging into the upper abdomen shows no acute findings.  IMPRESSION: Mild narrowing of the right mainstem bronchus distally and adjacent airways. It is difficult to determine scratch head is difficult to evaluate central mass seen on prior CT given the lack of intravenous contrast. There are airspace opacities in the right perihilar region including right lower lobe and right middle lobe most compatible with pneumonia.  Patchy  ground-glass opacities throughout the left lung, also likely infectious/ inflammatory.  Small bilateral pleural effusions.   Electronically Signed   By: Rolm Baptise M.D.   On: 04/02/2015 10:02   Dg Chest Port 1 View  04/01/2015   CLINICAL DATA:  Respiratory distress. History of lung cancer on chemotherapy  EXAM: PORTABLE CHEST - 1 VIEW  COMPARISON:  09/25/2014  FINDINGS: Port-A-Cath tip in the SVC. 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 Dual lead pacemaker unchanged.  Progression of bilateral airspace disease in the lower lobes, right greater than left. There is extensive airspace disease on the right. This is concerning for recurrent neoplasm. Differential does include pneumonia. Small bilateral effusions.   IMPRESSION: Bibasilar airspace disease right greater than left has progressed significantly from the prior study. This is worrisome for recurrent carcinoma however pneumonia could also have this appearance. If the patient does not have current symptoms of pneumonia, CT chest with contrast recommended for further evaluation.   Electronically Signed   By: Franchot Gallo M.D.   On: 04/01/2015 08:40     CBC  Recent Labs Lab 04/01/15 0822 04/02/15 1254 04/03/15 0643  WBC 10.1 9.9 9.0  HGB 12.2 10.3* 9.9*  HCT 38.1 30.8* 30.5*  PLT 296 210 204  MCV 94.9 93.4 93.8  MCH 30.5 31.2 30.5  MCHC 32.1 33.5 32.5  RDW 17.3* 17.0* 17.7*  LYMPHSABS 1.6  --   --   MONOABS 1.0*  --   --   EOSABS 0.3  --   --   BASOSABS 0.0  --   --     Chemistries   Recent Labs Lab 04/01/15 0822 04/03/15 0643  NA 139  --   K 3.8  --   CL 98*  --   CO2 28  --   GLUCOSE 327*  --   BUN 22*  --   CREATININE 1.11* 0.87  CALCIUM 9.0  --   AST 30  --   ALT 16  --   ALKPHOS 72  --   BILITOT 0.6  --    ------------------------------------------------------------------------------------------------------------------ estimated creatinine clearance is 57.8 mL/min (by C-G formula based on Cr of 0.87). ------------------------------------------------------------------------------------------------------------------ No results for input(s): HGBA1C in the last 72 hours. ------------------------------------------------------------------------------------------------------------------ No results for input(s): CHOL, HDL, LDLCALC, TRIG, CHOLHDL, LDLDIRECT in the last 72 hours. ------------------------------------------------------------------------------------------------------------------  Recent Labs  04/03/15 0643  TSH 0.679   ------------------------------------------------------------------------------------------------------------------ No results for input(s): VITAMINB12, FOLATE, FERRITIN, TIBC, IRON,  RETICCTPCT in the last 72 hours.  Coagulation profile  Recent Labs Lab 04/01/15 2044  INR 1.54    No results for input(s): DDIMER in the last 72 hours.  Cardiac Enzymes  Recent Labs Lab 04/01/15 0822 04/01/15 1210 04/03/15 0643  TROPONINI 0.48* 1.83* 0.47*   ------------------------------------------------------------------------------------------------------------------ Invalid input(s): POCBNP    Assessment & Plan   Active Problems:   * Acute hypoxic respiratory failure:  Due to bilateral  pneumonia continue vancomycin and Zosyn  * Suspected sepsis: with hypotension, elevated lactic acid, tachypnea and fever. Broad-spectrum IV antibiotics, blood cultures negative  *Possible non-ST MI; due to suppressed EF on echocardiogram will need ischemic workup per cardiology  * History of DVT heparin has been discontinued Eliquis restarted  * lung cancer: followed by Dr. Oliva Bustard. Seen by Dr. Oliva Bustard who feels that there is no recurrence of the cancer  * hypothyroidism: continue Synthroid  *Hyperlipidemia continue atorvastatin  * Hypothyroidism continue Synthroid     Code Status Orders        Start  Ordered   04/01/15 1420  Full code   Continuous     04/01/15 1419           Consults  cardiology DVT Prophylaxis  heparin  Lab Results  Component Value Date   PLT 204 04/03/2015     Time Spent in minutes   35 minutes   Transfer patient to the floor   Dustin Flock M.D on 04/03/2015 at 12:15 PM  Between 7am to 6pm - Pager - [phone]  After 6pm go to [email or web address] - password [email or web address] [facility]   Office  [phone]

## 2015-04-04 DIAGNOSIS — IMO0001 Reserved for inherently not codable concepts without codable children: Secondary | ICD-10-CM | POA: Insufficient documentation

## 2015-04-04 LAB — CBC
HCT: 30.8 % — ABNORMAL LOW (ref 35.0–47.0)
Hemoglobin: 10.2 g/dL — ABNORMAL LOW (ref 12.0–16.0)
MCH: 31.1 pg (ref 26.0–34.0)
MCHC: 33 g/dL (ref 32.0–36.0)
MCV: 94 fL (ref 80.0–100.0)
Platelets: 192 10*3/uL (ref 150–440)
RBC: 3.28 MIL/uL — ABNORMAL LOW (ref 3.80–5.20)
RDW: 17.7 % — ABNORMAL HIGH (ref 11.5–14.5)
WBC: 6.7 10*3/uL (ref 3.6–11.0)

## 2015-04-04 LAB — GLUCOSE, CAPILLARY
GLUCOSE-CAPILLARY: 133 mg/dL — AB (ref 65–99)
Glucose-Capillary: 113 mg/dL — ABNORMAL HIGH (ref 65–99)
Glucose-Capillary: 150 mg/dL — ABNORMAL HIGH (ref 65–99)

## 2015-04-04 MED ORDER — INSULIN ASPART 100 UNIT/ML ~~LOC~~ SOLN
0.0000 [IU] | Freq: Three times a day (TID) | SUBCUTANEOUS | Status: DC
  Administered 2015-04-04: 1 [IU] via SUBCUTANEOUS

## 2015-04-04 MED ORDER — INSULIN ASPART 100 UNIT/ML ~~LOC~~ SOLN
0.0000 [IU] | Freq: Three times a day (TID) | SUBCUTANEOUS | Status: DC
  Filled 2015-04-04: qty 2

## 2015-04-04 MED ORDER — IPRATROPIUM-ALBUTEROL 0.5-2.5 (3) MG/3ML IN SOLN
3.0000 mL | Freq: Four times a day (QID) | RESPIRATORY_TRACT | Status: DC
  Administered 2015-04-04 – 2015-04-07 (×10): 3 mL via RESPIRATORY_TRACT
  Filled 2015-04-04 (×11): qty 3

## 2015-04-04 MED ORDER — PREDNISONE 20 MG PO TABS
20.0000 mg | ORAL_TABLET | Freq: Once | ORAL | Status: AC
  Administered 2015-04-04: 20 mg via ORAL
  Filled 2015-04-04: qty 1

## 2015-04-04 MED ORDER — SODIUM CHLORIDE 0.9 % IJ SOLN
3.0000 mL | INTRAMUSCULAR | Status: DC | PRN
  Administered 2015-04-05 – 2015-04-07 (×3): 3 mL via INTRAVENOUS
  Filled 2015-04-04 (×3): qty 10

## 2015-04-04 NOTE — Progress Notes (Signed)
[place] at [facility]                                                                                                                                                                                            Patient Demographics   Jenny Cooper, is a 79 y.o. female, DOB - 08/12/1936, ZWC:[id number]  Admit date - 04/01/2015   Admitting Physician Max Sane, MD  Outpatient Primary MD for the patient is ALDRIDGE,Kela, MD   LOS - 3  Subjective:  Patient's breathing slowly continues to improve. She complains of some erythema in her right cheek   Review of Systems:   CONSTITUTIONAL: No documented fever. Positive fatigue, weakness. No weight gain, no weight loss.  EYES: No blurry or double vision.  ENT: No tinnitus. No postnasal drip. No redness of the oropharynx.  RESPIRATORY: Dry cough, no wheeze, no hemoptysis. Positive dyspnea.  CARDIOVASCULAR: No chest pain. No orthopnea. No palpitations. No syncope.  GASTROINTESTINAL: No nausea, no vomiting or diarrhea. No abdominal pain. No melena or hematochezia.  GENITOURINARY: No dysuria or hematuria.  ENDOCRINE: No polyuria or nocturia. No heat or cold intolerance.  HEMATOLOGY: No anemia. No bruising. No bleeding.  INTEGUMENTARY: No rashes. No lesions.  MUSCULOSKELETAL: No arthritis. No swelling. No gout.  NEUROLOGIC: No numbness, tingling, or ataxia. No seizure-type activity.  PSYCHIATRIC: No anxiety. No insomnia. No ADD.    Vitals:   Filed Vitals:   04/04/15 1000 04/04/15 1100 04/04/15 1155 04/04/15 1206  BP:    121/79  Pulse: 76 149  74  Temp:    98.2 F (36.8 C)  TempSrc:      Resp: '16 19  19  '$ Height:      Weight:      SpO2: 99% 93% 94% 97%    Wt Readings from Last 3 Encounters:  04/03/15 76.5 kg (168 lb 10.4 oz)  03/05/15 72.5 kg (159 lb 13.3 oz)  02/26/15 71.9 kg (158 lb 8.2 oz)     Intake/Output Summary (Last 24 hours) at 04/04/15 1219 Last data filed at 04/04/15 1100  Gross  per 24 hour  Intake    450 ml  Output   1780 ml  Net  -1330 ml    Physical Exam:   GENERAL: Pleasant-appearing in no apparent distress.  HEAD, EYES, EARS, NOSE AND THROAT: Atraumatic, normocephalic. Extraocular muscles are intact. Pupils equal and reactive to light. Sclerae anicteric. No conjunctival injection. No oro-pharyngeal erythema.  NECK: Supple. There is no jugular venous distention. No bruits, no lymphadenopathy, no thyromegaly.  HEART: Regular rate and rhythm,. No murmurs, no rubs, no clicks.  LUNGS: Bilateral rhonchi, no wheezing  ABDOMEN: Soft, flat, nontender, nondistended. Has good bowel sounds. No hepatosplenomegaly appreciated.  EXTREMITIES: No evidence of any cyanosis, clubbing, or peripheral edema.  +2 pedal and radial pulses bilaterally.  NEUROLOGIC: The patient is alert, awake, and oriented x3 with no focal motor or sensory deficits appreciated bilaterally.  SKIN: Moist and warm with no rashes appreciated. Right sided cheek erythema Psych: Not anxious, depressed LN: No inguinal LN enlargement    Antibiotics   Anti-infectives    Start     Dose/Rate Route Frequency Ordered Stop   04/03/15 1400  vancomycin (VANCOCIN) IVPB 1000 mg/200 mL premix     1,000 mg 200 mL/hr over 60 Minutes Intravenous Every 18 hours 04/03/15 0804     04/02/15 2030  vancomycin (VANCOCIN) IVPB 1000 mg/200 mL premix  Status:  Discontinued     1,000 mg 200 mL/hr over 60 Minutes Intravenous Every 24 hours 04/01/15 1448 04/01/15 1448   04/02/15 2030  vancomycin (VANCOCIN) IVPB 1000 mg/200 mL premix  Status:  Discontinued     1,000 mg 200 mL/hr over 60 Minutes Intravenous Every 24 hours 04/01/15 1448 04/03/15 0804   04/02/15 0800  azithromycin (ZITHROMAX) 500 mg in dextrose 5 % 250 mL IVPB  Status:  Discontinued     500 mg 250 mL/hr over 60 Minutes Intravenous Every 24 hours 04/01/15 1419 04/01/15 1540   04/01/15 2030  vancomycin (VANCOCIN) IVPB 1000 mg/200 mL premix     1,000 mg 200 mL/hr  over 60 Minutes Intravenous  Once 04/01/15 1448 04/01/15 2345   04/01/15 1545  piperacillin-tazobactam (ZOSYN) IVPB 3.375 g     3.375 g 12.5 mL/hr over 240 Minutes Intravenous 3 times per day 04/01/15 1540     04/01/15 1500  cefTRIAXone (ROCEPHIN) 1 g in dextrose 5 % 50 mL IVPB  Status:  Discontinued     1 g 100 mL/hr over 30 Minutes Intravenous Every 24 hours 04/01/15 1419 04/01/15 1540   04/01/15 1033  piperacillin-tazobactam (ZOSYN) 3.375 (3-0.375) G injection    Comments:  COTRONE, JILL: cabinet override      04/01/15 1033 04/01/15 1033   04/01/15 0930  vancomycin (VANCOCIN) IVPB 1000 mg/200 mL premix     1,000 mg 200 mL/hr over 60 Minutes Intravenous  Once 04/01/15 0918 04/01/15 1026   04/01/15 0930  levofloxacin (LEVAQUIN) IVPB 750 mg     750 mg 100 mL/hr over 90 Minutes Intravenous  Once 04/01/15 0918 04/01/15 1051   04/01/15 0930  piperacillin-tazobactam (ZOSYN) IVPB 3.375 g     3.375 g 100 mL/hr over 30 Minutes Intravenous  Once 04/01/15 0918 04/01/15 1102      Medications   Scheduled Meds: . antiseptic oral rinse  7 mL Mouth Rinse q12n4p  . apixaban  5 mg Oral BID  . aspirin  81 mg Oral Daily  . atenolol  25 mg Oral Daily  . atorvastatin  20 mg Oral Daily  . azelastine  2 spray Each Nare BID  . budesonide (PULMICORT) nebulizer solution  0.5 mg Nebulization BID  . calcium-vitamin D  1 tablet Oral BID WC  . chlorhexidine  15 mL Mouth Rinse BID  . docusate sodium  100 mg Oral BID  . insulin aspart  0-15 Units Subcutaneous TID WC  . insulin aspart  0-9 Units Subcutaneous TID WC  . ipratropium-albuterol  3 mL Nebulization Q4H  . levothyroxine  75 mcg Oral QAC breakfast  . loratadine  10 mg Oral Daily  . methylPREDNISolone (  SOLU-MEDROL) injection  40 mg Intravenous Q24H  . pantoprazole  40 mg Oral Daily  . piperacillin-tazobactam (ZOSYN)  IV  3.375 g Intravenous 3 times per day  . vancomycin  1,000 mg Intravenous Q18H   Continuous Infusions:   PRN Meds:.   Data  Review:   Micro Results Recent Results (from the past 240 hour(s))  Culture, blood (routine x 2)     Status: None (Preliminary result)   Collection Time: 04/01/15  8:22 AM  Result Value Ref Range Status   Specimen Description BLOOD RIGHT HAND  Final   Special Requests   Final    BOTTLES DRAWN AEROBIC AND ANAEROBIC 2CC ANAEROBIC, 5CC AEROBIC   Culture NO GROWTH 3 DAYS  Final   Report Status PENDING  Incomplete  Culture, blood (routine x 2)     Status: None (Preliminary result)   Collection Time: 04/01/15  8:27 AM  Result Value Ref Range Status   Specimen Description BLOOD RIGHT HAND  Final   Special Requests   Final    BOTTLES DRAWN AEROBIC AND ANAEROBIC 3CC ANAEROBIC,5CC AEROBIC   Culture NO GROWTH 3 DAYS  Final   Report Status PENDING  Incomplete  MRSA PCR Screening     Status: None   Collection Time: 04/01/15  2:04 PM  Result Value Ref Range Status   MRSA by PCR NEGATIVE NEGATIVE Final    Comment:        The GeneXpert MRSA Assay (FDA approved for NASAL specimens only), is one component of a comprehensive MRSA colonization surveillance program. It is not intended to diagnose MRSA infection nor to guide or monitor treatment for MRSA infections.   Culture, blood (routine x 2) Call MD if unable to obtain prior to antibiotics being given     Status: None   Collection Time: 04/01/15  2:25 PM  Result Value Ref Range Status   Specimen Description Blood  Final   Special Requests NONE  Final   Report Status 04/01/2015 FINAL  Final  Culture, blood (routine x 2) Call MD if unable to obtain prior to antibiotics being given     Status: None   Collection Time: 04/01/15  2:51 PM  Result Value Ref Range Status   Specimen Description Blood  Final   Special Requests NONE  Final   Report Status 04/01/2015 FINAL  Final  Streptococcus Pneumoniae Ag     Status: None   Collection Time: 04/02/15 12:21 AM  Result Value Ref Range Status   Specimen Source Urine  Final   Streptococcus  Pneumoniae Ag Negative Negative Final   Body Fld Culture, Sterile Not Indicated  Final   Org ID Not indicated.  Final   Please Note: Comment  Final    Comment: (NOTE) College of American Pathologists guidelines require a culture to be performed on CSF specimens negative by bacterial antigen testing (CAP MIC.22550). Performed At: [facility] [place], [place] [id number] Lindon Romp MD YW:[id number]    Source of Sample URINE, RANDOM  Final    Radiology Reports Ct Chest Wo Contrast  04/02/2015   CLINICAL DATA:  History of lung cancer. Postobstructive pneumonia, possible sepsis. Shortness of breath and cough for last 2-3 weeks.  EXAM: CT CHEST WITHOUT CONTRAST  TECHNIQUE: Multidetector CT imaging of the chest was performed following the standard protocol without IV contrast.  COMPARISON:  Chest CT 09/25/2014, 09/17/2014.  FINDINGS: There are small bilateral pleural effusions. Previously seen central right lung mass involving the right mainstem  bronchus is difficult to separate from adjacent vasculature on this noncontrast study. There are right perihilar airspace opacities. This could reflect pneumonia. There is mild narrowing of the right mainstem bronchus and adjacent airways without complete obstruction. Ground-glass opacities are noted in the left lower lobe and lingula, also likely inflammatory.  Mild underlying COPD. Heart is mildly enlarged. Pacer wires noted in the right heart. Prior mitral valve replacement. Coronary artery and aortic calcifications. No evidence of aortic aneurysm.  No visible mediastinal or axillary adenopathy.  Chest wall soft tissues are unremarkable. Imaging into the upper abdomen shows no acute findings.  IMPRESSION: Mild narrowing of the right mainstem bronchus distally and adjacent airways. It is difficult to determine scratch head is difficult to evaluate central mass seen on prior CT given the lack of intravenous contrast. There are airspace  opacities in the right perihilar region including right lower lobe and right middle lobe most compatible with pneumonia.  Patchy ground-glass opacities throughout the left lung, also likely infectious/ inflammatory.  Small bilateral pleural effusions.   Electronically Signed   By: Rolm Baptise M.D.   On: 04/02/2015 10:02   Dg Chest Port 1 View  04/01/2015   CLINICAL DATA:  Respiratory distress. History of lung cancer on chemotherapy  EXAM: PORTABLE CHEST - 1 VIEW  COMPARISON:  09/25/2014  FINDINGS: Port-A-Cath tip in the SVC. 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 Dual lead pacemaker unchanged.  Progression of bilateral airspace disease in the lower lobes, right greater than left. There is extensive airspace disease on the right. This is concerning for recurrent neoplasm. Differential does include pneumonia. Small bilateral effusions.  IMPRESSION: Bibasilar airspace disease right greater than left has progressed significantly from the prior study. This is worrisome for recurrent carcinoma however pneumonia could also have this appearance. If the patient does not have current symptoms of pneumonia, CT chest with contrast recommended for further evaluation.   Electronically Signed   By: Franchot Gallo M.D.   On: 04/01/2015 08:40     CBC  Recent Labs Lab 04/01/15 0822 04/02/15 1254 04/03/15 0643 04/04/15 0309  WBC 10.1 9.9 9.0 6.7  HGB 12.2 10.3* 9.9* 10.2*  HCT 38.1 30.8* 30.5* 30.8*  PLT 296 210 204 192  MCV 94.9 93.4 93.8 94.0  MCH 30.5 31.2 30.5 31.1  MCHC 32.1 33.5 32.5 33.0  RDW 17.3* 17.0* 17.7* 17.7*  LYMPHSABS 1.6  --   --   --   MONOABS 1.0*  --   --   --   EOSABS 0.3  --   --   --   BASOSABS 0.0  --   --   --     Chemistries   Recent Labs Lab 04/01/15 0822 04/03/15 0643  NA 139  --   K 3.8  --   CL 98*  --   CO2 28  --   GLUCOSE 327*  --   BUN 22*  --   CREATININE 1.11* 0.87  CALCIUM 9.0  --   AST 30  --   ALT 16  --   ALKPHOS 72  --   BILITOT 0.6  --     ------------------------------------------------------------------------------------------------------------------ estimated creatinine clearance is 57.8 mL/min (by C-G formula based on Cr of 0.87). ------------------------------------------------------------------------------------------------------------------ No results for input(s): HGBA1C in the last 72 hours. ------------------------------------------------------------------------------------------------------------------ No results for input(s): CHOL, HDL, LDLCALC, TRIG, CHOLHDL, LDLDIRECT in the last 72 hours. ------------------------------------------------------------------------------------------------------------------  Recent Labs  04/03/15 0643  TSH 0.679   ------------------------------------------------------------------------------------------------------------------ No results for  input(s): VITAMINB12, FOLATE, FERRITIN, TIBC, IRON, RETICCTPCT in the last 72 hours.  Coagulation profile  Recent Labs Lab 04/01/15 2044  INR 1.54    No results for input(s): DDIMER in the last 72 hours.  Cardiac Enzymes  Recent Labs Lab 04/01/15 0822 04/01/15 1210 04/03/15 0643  TROPONINI 0.48* 1.83* 0.47*   ------------------------------------------------------------------------------------------------------------------ Invalid input(s): POCBNP    Assessment & Plan   Active Problems:   * Acute hypoxic respiratory failure:  Due to bilateral  pneumonia continue vancomycin and Zosyn repeat chest x-ray in the a.m.  * Suspected sepsis: with hypotension, elevated lactic acid, tachypnea and fever. Broad-spectrum IV antibiotics, blood cultures negative  *Possible non-ST MI; due to suppressed EF on echocardiogram will need ischemic workup per cardiology likely a as outpatient  * History of DVT heparin has been discontinued Eliquis restarted  * lung cancer: followed by Dr. Oliva Bustard. Seen by Dr. Oliva Bustard who feels that there is  no recurrence of the cancer  * hypothyroidism: continue Synthroid  *Hyperlipidemia continue atorvastatin  * Erythema involving her cheek possible allergic reaction by mouth prednisone 1,      Code Status Orders        Start     Ordered   04/01/15 1420  Full code   Continuous     04/01/15 1419           Consults  cardiology DVT Prophylaxis  heparin  Lab Results  Component Value Date   PLT 192 04/04/2015     Time Spent in minutes   35 minutes   Transfer patient to the floor   Dustin Flock M.D on 04/04/2015 at 12:19 PM  Between 7am to 6pm - Pager - [phone]  After 6pm go to [email or web address] - password [email or web address] [place] [facility]   Office  [phone]

## 2015-04-04 NOTE — Care Management Note (Signed)
Case Management Note  Patient Details  Name: Verne Cove MRN: [id number] Date of Birth: [birth year]  Subjective/Objective:                  Met with patient and her daughter Asencion Partridge regarding O2 and discharge planning. Prior to this admission patient was driving and still works. She is currently weak but better than she was when she came in per [place]. She listed Dr. Jeb Levering as her primary physician. She is new to O2 but had it after lung surgery through [facility]. She agrees to home O2 if needed again through [facility].  Action/Plan: List of home health care agencies provided to daughter. Referral called to Will with [facility] to monitor for home O2. O2 assessment needed to see if patient qualifies for home O2. Patient considering home health.   Expected Discharge Date:                  Expected Discharge Plan:     In-House Referral:     Discharge planning Services  CM Consult  Post Acute Care Choice:  Home Health Choice offered to:  Patient, Adult Children  DME Arranged:  Oxygen DME Agency:     HH Arranged:    [place] Agency:     Status of Service:  In process, will continue to follow  Medicare Important Message Given:  Yes-second notification given Date Medicare IM Given:    Medicare IM give by:    Date Additional Medicare IM Given:    Additional Medicare Important Message give by:     If discussed at [place] of Stay Meetings, dates discussed:    Additional Comments:  Marshell Garfinkel, RN 04/04/2015, 11:07 AM

## 2015-04-04 NOTE — Progress Notes (Signed)
Patient: Jenny Cooper / Admit Date: 04/01/2015 / Date of Encounter: 04/04/2015, 8:58 AM   Subjective: Feels well this morning, once to ambulate, On low-flow nasal cannula oxygen. Still with cough," nothing has broken up yet" Back is hurting, wants to get out of bed. Troponin 0.48 she prefers cardiac catheterization as an outpatient.    Review of Systems: Review of Systems  Constitutional: Positive for malaise/fatigue. Negative for fever, chills and diaphoresis.  Eyes: Negative for blurred vision, discharge and redness.  Respiratory: Positive for cough, sputum production, shortness of breath and wheezing. Negative for hemoptysis.   Cardiovascular: Negative for chest pain, palpitations, orthopnea, claudication, leg swelling and PND.  Gastrointestinal: Negative for heartburn, nausea, vomiting, blood in stool and melena.  Genitourinary: Negative for hematuria.  Musculoskeletal: Negative for myalgias and falls.  Skin: Negative for rash.  Neurological: Positive for weakness. Negative for sensory change, speech change, focal weakness and headaches.  Endo/Heme/Allergies: Does not bruise/bleed easily.  Psychiatric/Behavioral: Negative.   All other systems reviewed and are negative.    Objective: Telemetry: NSR, 80's to 90's  Physical Exam: Blood pressure 99/81, pulse 67, temperature 97.7 F (36.5 C), temperature source Oral, resp. rate 16, height '5\' 7"'$  (1.702 m), weight 168 lb 10.4 oz (76.5 kg), SpO2 99 %. Body mass index is 26.41 kg/(m^2). General: Well developed, well nourished, in no acute distress. Head: Normocephalic, atraumatic, sclera non-icteric, no xanthomas, nares are without discharge. Neck: Negative for carotid bruits. JVP not elevated. Lungs: Expiratory wheezing,  Heart: RRR S1 S2, II/VI systolic murmurs. No rubs, or gallops.  Abdomen: Soft, non-tender, non-distended with normoactive bowel sounds. No rebound/guarding. Extremities: No clubbing or cyanosis. No edema.  Distal pedal pulses are 2+ and equal bilaterally. Neuro: Alert and oriented X 3. Moves all extremities spontaneously. Psych:  Responds to questions appropriately with a normal affect.   Intake/Output Summary (Last 24 hours) at 04/04/15 0858 Last data filed at 04/04/15 0600  Gross per 24 hour  Intake  532.8 ml  Output   1280 ml  Net -747.2 ml    Inpatient Medications:  . antiseptic oral rinse  7 mL Mouth Rinse q12n4p  . apixaban  5 mg Oral BID  . aspirin  81 mg Oral Daily  . atenolol  25 mg Oral Daily  . atorvastatin  20 mg Oral Daily  . azelastine  2 spray Each Nare BID  . budesonide (PULMICORT) nebulizer solution  0.5 mg Nebulization BID  . calcium-vitamin D  1 tablet Oral BID WC  . chlorhexidine  15 mL Mouth Rinse BID  . docusate sodium  100 mg Oral BID  . ipratropium-albuterol  3 mL Nebulization Q4H  . levothyroxine  75 mcg Oral QAC breakfast  . loratadine  10 mg Oral Daily  . methylPREDNISolone (SOLU-MEDROL) injection  40 mg Intravenous Q24H  . pantoprazole  40 mg Oral Daily  . piperacillin-tazobactam (ZOSYN)  IV  3.375 g Intravenous 3 times per day  . vancomycin  1,000 mg Intravenous Q18H   Infusions:     Labs:  Recent Labs  04/03/15 0643  CREATININE 0.87   No results for input(s): AST, ALT, ALKPHOS, BILITOT, PROT, ALBUMIN in the last 72 hours.  Recent Labs  04/03/15 0643 04/04/15 0309  WBC 9.0 6.7  HGB 9.9* 10.2*  HCT 30.5* 30.8*  MCV 93.8 94.0  PLT 204 192    Recent Labs  04/01/15 1210 04/03/15 0643  TROPONINI 1.83* 0.47*   Invalid input(s): POCBNP No results for input(s): HGBA1C  in the last 72 hours.   Weights: Filed Weights   04/01/15 0810 04/03/15 0532  Weight: 155 lb (70.308 kg) 168 lb 10.4 oz (76.5 kg)     Radiology/Studies:  Ct Chest Wo Contrast  04/02/2015     IMPRESSION: Mild narrowing of the right mainstem bronchus distally and adjacent airways. It is difficult to determine scratch head is difficult to evaluate central mass seen  on prior CT given the lack of intravenous contrast. There are airspace opacities in the right perihilar region including right lower lobe and right middle lobe most compatible with pneumonia.  Patchy ground-glass opacities throughout the left lung, also likely infectious/ inflammatory.  Small bilateral pleural effusions.   Electronically Signed   By: Rolm Baptise M.D.   On: 04/02/2015 10:02   Dg Chest Port 1 View  04/01/2015    IMPRESSION: Bibasilar airspace disease right greater than left has progressed significantly from the prior study. This is worrisome for recurrent carcinoma however pneumonia could also have this appearance. If the patient does not have current symptoms of pneumonia, CT chest with contrast recommended for further evaluation.   Electronically Signed   By: Franchot Gallo M.D.   On: 04/01/2015 08:40     Assessment and Plan  79 y.o. female with h/o CAD s/p remote history of MI in 2002 s/p 2 vessel CABG post stenting in 2002, history of mitral valve repair in 2002 secondary to mitral regurgitation, history of PAF s/p prior TEE/DCCV on Eliquis, atypical atrial flutter s/p ablation on 07/27/2013 s/p MDT PPM, carcinoma of right lung, COPD, HTN, and HLD who presented to [facility] 8[facility]2 with increased SOB and was found to have PNA, possibly post obstructive and elevated troponin.  1. Elevated troponin: -0.48-->1.83--->   >2, now 3.61 -Uncertain if this elevation is secondary to acute ACS vs supply demand ischemia in the setting of her acute respiratory failure 2/2 possible post obstructive pneumonia.   -- changes in echo with newly depressed EF to 30-35% with new wall motion abnormalities in  anterior wall when compared to prior studies   -off heparin gtt  -Will plan for  ischemic evaluation by cardiac catheterization, patient prefers to wait until she is able to lay flat, improved coughing, likely as an outpatient in one or 2 weeks    2. CAD s/p CABG: -Stress test in 2008 without ischemia   -Echo as above -Continue aspirin 81 -back on Eliquis for her PAF, h/o DVT -Continue atenolol 25 mg daily, and Lipitor 20 mg  3. PAF s/p remote TEE/DCCV and atypical atrial flutter s/p ablation 07/27/2013: -Currently in sinus, tele with no arrhythmia Eliquis  -Continue atenolol as BP tolerates  4. Acute respiratory failure with hypoxia and sepsis: -She is tachypneic, tachycardic, and WBC baseline is 2.9-3.1 (currently at 10.1, which is a significant leukocytosis for her given her chemoradiation)  -Secondary to likely postobstructive pneumonia  -Off BiPAP -Continue inhalers, steroids, and ABX per IM  5. HTN:  -Continue meds as above, hold arb for low BP  6. HLD: -Lipitor    Signed, Esmond Plants, [facility] 04/04/2015, 8:58 AM

## 2015-04-04 NOTE — Progress Notes (Signed)
Patient alert and oriented, rested comfortably during night. 3L n/c, SATs WNL. Stool softener and Tylenol administered for back pain. Up to [facility], good UOP. PIV x 2, abx infusing.

## 2015-04-05 ENCOUNTER — Inpatient Hospital Stay: Payer: Medicare Other

## 2015-04-05 DIAGNOSIS — I481 Persistent atrial fibrillation: Secondary | ICD-10-CM

## 2015-04-05 LAB — BASIC METABOLIC PANEL
Anion gap: 7 (ref 5–15)
BUN: 23 mg/dL — ABNORMAL HIGH (ref 6–20)
CALCIUM: 8.8 mg/dL — AB (ref 8.9–10.3)
CO2: 29 mmol/L (ref 22–32)
Chloride: 105 mmol/L (ref 101–111)
Creatinine, Ser: 0.79 mg/dL (ref 0.44–1.00)
GFR calc Af Amer: >60 mL/min (ref >60)
GFR calc non Af Amer: >60 mL/min (ref >60)
Glucose, Bld: 137 mg/dL — ABNORMAL HIGH (ref 65–99)
Potassium: 4.1 mmol/L (ref 3.5–5.1)
Sodium: 141 mmol/L (ref 135–145)

## 2015-04-05 LAB — CBC
HCT: 32.6 % — ABNORMAL LOW (ref 35.0–47.0)
HEMOGLOBIN: 11 g/dL — AB (ref 12.0–16.0)
MCH: 31.6 pg (ref 26.0–34.0)
MCHC: 33.7 g/dL (ref 32.0–36.0)
MCV: 93.7 fL (ref 80.0–100.0)
Platelets: 214 10*3/uL (ref 150–440)
RBC: 3.48 MIL/uL — AB (ref 3.80–5.20)
RDW: 18.1 % — ABNORMAL HIGH (ref 11.5–14.5)
WBC: 5.8 10*3/uL (ref 3.6–11.0)

## 2015-04-05 LAB — GLUCOSE, CAPILLARY
GLUCOSE-CAPILLARY: 100 mg/dL — AB (ref 65–99)
GLUCOSE-CAPILLARY: 109 mg/dL — AB (ref 65–99)
GLUCOSE-CAPILLARY: 118 mg/dL — AB (ref 65–99)
Glucose-Capillary: 110 mg/dL — ABNORMAL HIGH (ref 65–99)

## 2015-04-05 LAB — VANCOMYCIN, TROUGH: Vancomycin Tr: 13 ug/mL (ref 10–20)

## 2015-04-05 MED ORDER — LEVOFLOXACIN 500 MG PO TABS
500.0000 mg | ORAL_TABLET | Freq: Every day | ORAL | Status: DC
  Administered 2015-04-05 – 2015-04-07 (×3): 500 mg via ORAL
  Filled 2015-04-05 (×3): qty 1

## 2015-04-05 MED ORDER — METOPROLOL SUCCINATE ER 25 MG PO TB24
25.0000 mg | ORAL_TABLET | Freq: Two times a day (BID) | ORAL | Status: DC
  Administered 2015-04-05 – 2015-04-07 (×5): 25 mg via ORAL
  Filled 2015-04-05 (×5): qty 1

## 2015-04-05 NOTE — Progress Notes (Signed)
Patient Name: Jenny Cooper      SUBJECTIVE:>>79 y.o. female with h/o CAD s/p remote history of MI in 2002 s/p 2 vessel CABG post stenting in 2002, history of mitral valve repair in 2002 secondary to mitral regurgitation, history of PAF s/p prior TEE/DCCV on Eliquis, atypical atrial flutter s/p ablation on 07/27/2013 s/p MDT PPM, carcinoma of right lung, COPD, HTN, and HLD who presented to [facility] on 8/2 with increased SOB and was found to have PNA, possibly post obstructive and elevated troponin.  Past Medical History  Diagnosis Date  . HTN (hypertension)   . Pacemaker     a. MDT 2002; b. generator replacement 2013; c. followed by Dr. Omelia Blackwater, MD  . HLD (hyperlipidemia)   . CAD (coronary artery disease)     a. s/p MI x 2 in 2002 s/p PCI x 2 in 2002; b. s/p 2v CABG 2002; c. stress echo 07/2004 w/ evidence of pos & inf infarct & no evidence of ischemia; d. 4/08 dipyridamold scan w/ multiple areas of infarct, no ischemia, EF 49%     . Lung cancer   . History of colonoscopy 2013  . History of mammography, screening 2015  . History of Papanicolaou smear of cervix 2013  . Carcinoma of right lung 01/03/2015    a. followed by Dr. Oliva Bustard  . Mitral regurgitation     a. s/p mitral ring placement 09/2000; b. echo 09/2010: EF 50%, inf HK, post AK, mild MR, prosthetic mitral valve ring w/ peak gradient of 10 mmHg; b. echo 2/13: EF 50%, mild MR/TR     . History of blood clots     12/2001  . Atypical atrial flutter     a. s/p ablation 07/27/2013  . PAF (paroxysmal atrial fibrillation)     a. on Eliquis   . COPD (chronic obstructive pulmonary disease)     Scheduled Meds:  Scheduled Meds: . antiseptic oral rinse  7 mL Mouth Rinse q12n4p  . apixaban  5 mg Oral BID  . aspirin  81 mg Oral Daily  . atenolol  25 mg Oral Daily  . atorvastatin  20 mg Oral Daily  . azelastine  2 spray Each Nare BID  . budesonide (PULMICORT) nebulizer solution  0.5 mg Nebulization BID  . calcium-vitamin D   1 tablet Oral BID WC  . chlorhexidine  15 mL Mouth Rinse BID  . docusate sodium  100 mg Oral BID  . insulin aspart  0-15 Units Subcutaneous TID WC  . ipratropium-albuterol  3 mL Nebulization Q6H  . levofloxacin  500 mg Oral Daily  . levothyroxine  75 mcg Oral QAC breakfast  . loratadine  10 mg Oral Daily  . pantoprazole  40 mg Oral Daily  . piperacillin-tazobactam (ZOSYN)  IV  3.375 g Intravenous 3 times per day   Continuous Infusions:  acetaminophen, sodium chloride    PHYSICAL EXAM Filed Vitals:   04/05/15 0642 04/05/15 0759 04/05/15 1141 04/05/15 1349  BP: 135/92  121/96   Pulse: 104  102   Temp: 97.5 F (36.4 C)  98.7 F (37.1 C)   TempSrc: Oral     Resp: 23  20   Height:      Weight:      SpO2: 96% 95% 95% 94%    Well developed and nourished in no acute distress HENT normal Neck supple with JVP-flat Carotids brisk and full without bruits Clear Irregularly irregular rate and rhythm with controlled ventricular response,  no murmurs or gallops Abd-soft with active BS without hepatomegaly No Clubbing cyanosis edema Skin-warm and dry A & Oriented  Grossly normal sensory and motor function  TELEMETRY: Reviewed telemetry pt in *afib     Intake/Output Summary (Last 24 hours) at 04/05/15 1427 Last data filed at 04/05/15 1409  Gross per 24 hour  Intake    950 ml  Output    800 ml  Net    150 ml    LABS: Basic Metabolic Panel:  Recent Labs Lab 04/01/15 0822 04/03/15 0643 04/05/15 0135  NA 139  --  141  K 3.8  --  4.1  CL 98*  --  105  CO2 28  --  29  GLUCOSE 327*  --  137*  BUN 22*  --  23*  CREATININE 1.11* 0.87 0.79  CALCIUM 9.0  --  8.8*   Cardiac Enzymes:  Recent Labs  04/03/15 0643  TROPONINI 0.47*   CBC:  Recent Labs Lab 04/01/15 0822 04/02/15 1254 04/03/15 0643 04/04/15 0309 04/05/15 0135  WBC 10.1 9.9 9.0 6.7 5.8  NEUTROABS 7.1*  --   --   --   --   HGB 12.2 10.3* 9.9* 10.2* 11.0*  HCT 38.1 30.8* 30.5* 30.8* 32.6*  MCV 94.9  93.4 93.8 94.0 93.7  PLT 296 210 204 192 214    Recent Labs  04/03/15 0643  TSH 0.679   Anemia Panel:    ASSESSMENT AND PLAN:  +  Tn CAD prior CABG LV dysfucntion new PAF with hx of ablation of atypical flutter Pneumonia and sepsis post obstructive pattern Atrial fibrilaltion  Tel looks like afib  Will do ECG   Plan is outpt cath Tn is hard to interpret, and would be reasonable with sepsis syndrome to reevaluate LV function once infection is resolved as this could also be due to sepsis  Continue apixoban and reasonable for short term to use ASA presuming that this was ACS which is not at all clear as outlined by Dr Gilda Crease and TG  Would use metoprolol with LV dusfunction  And as BP allows resume ARB for low EF ( prob tomorrow)  Think Pulm consult would be valuable with hx of postobstructive process     Signed, Virl Axe MD  04/05/2015

## 2015-04-05 NOTE — Progress Notes (Signed)
Patient ID: Jenny Cooper, female   DOB: [birth year], 79 y.o.   MRN: [id number] [facility] PROGRESS NOTE  PCP: Gayland Curry, MD  HPI/Subjective: Patient not feeling well at all. She is having shortness of breath with limited activity. She does not wear oxygen at home. Still with cough nonproductive.  Objective: Filed Vitals:   04/05/15 0642  BP: 135/92  Pulse: 104  Temp: 97.5 F (36.4 C)  Resp: 23    Intake/Output Summary (Last 24 hours) at 04/05/15 1027 Last data filed at 04/05/15 1002  Gross per 24 hour  Intake   1140 ml  Output    500 ml  Net    640 ml   Filed Weights   04/01/15 0810 04/03/15 0532  Weight: 70.308 kg (155 lb) 76.5 kg (168 lb 10.4 oz)    ROS: Review of Systems  Constitutional: Negative for fever and chills.  Eyes: Negative for blurred vision.  Respiratory: Positive for cough and shortness of breath. Negative for sputum production.   Cardiovascular: Negative for chest pain.  Gastrointestinal: Negative for nausea, vomiting, abdominal pain, diarrhea and constipation.  Genitourinary: Negative for dysuria.  Musculoskeletal: Positive for back pain. Negative for joint pain.  Neurological: Negative for dizziness and headaches.   Exam: Physical Exam  Constitutional: She is oriented to person, place, and time.  HENT:  Nose: No mucosal edema.  Mouth/Throat: No oropharyngeal exudate or posterior oropharyngeal edema.  Eyes: Conjunctivae, EOM and lids are normal. Pupils are equal, round, and reactive to light.  Neck: No JVD present. Carotid bruit is not present. No edema present. No thyroid mass and no thyromegaly present.  Cardiovascular: S1 normal and S2 normal.  An irregularly irregular rhythm present. Tachycardia present.  Exam reveals no gallop.   Murmur heard.  Systolic murmur is present with a grade of 2/6  Pulses:      Dorsalis pedis pulses are 2+ on the right side, and 2+ on the left side.  Respiratory: No respiratory  distress. She has wheezes in the left upper field, the left middle field and the left lower field. She has no rhonchi. She has no rales.  GI: Soft. Bowel sounds are normal. There is no tenderness.  Musculoskeletal:       Right ankle: She exhibits no swelling.       Left ankle: She exhibits no swelling.  Lymphadenopathy:    She has no cervical adenopathy.  Neurological: She is alert and oriented to person, place, and time. No cranial nerve deficit.  Skin: Skin is warm. No rash noted. Nails show no clubbing.  Psychiatric: She has a normal mood and affect.    Data Reviewed: Basic Metabolic Panel:  Recent Labs Lab 04/01/15 0822 04/03/15 0643 04/05/15 0135  NA 139  --  141  K 3.8  --  4.1  CL 98*  --  105  CO2 28  --  29  GLUCOSE 327*  --  137*  BUN 22*  --  23*  CREATININE 1.11* 0.87 0.79  CALCIUM 9.0  --  8.8*   Liver Function Tests:  Recent Labs Lab 04/01/15 0822  AST 30  ALT 16  ALKPHOS 72  BILITOT 0.6  PROT 7.4  ALBUMIN 4.0    Recent Labs Lab 04/01/15 0822  LIPASE 18*   CBC:  Recent Labs Lab 04/01/15 0822 04/02/15 1254 04/03/15 0643 04/04/15 0309 04/05/15 0135  WBC 10.1 9.9 9.0 6.7 5.8  NEUTROABS 7.1*  --   --   --   --  HGB 12.2 10.3* 9.9* 10.2* 11.0*  HCT 38.1 30.8* 30.5* 30.8* 32.6*  MCV 94.9 93.4 93.8 94.0 93.7  PLT 296 210 204 192 214   Cardiac Enzymes:  Recent Labs Lab 04/01/15 0822 04/01/15 1210 04/03/15 0643  TROPONINI 0.48* 1.83* 0.47*    CBG:  Recent Labs Lab 04/02/15 0038 04/04/15 1210 04/04/15 1638 04/04/15 2030 04/05/15 0751  GLUCAP 204* 113* 133* 150* 100*    Studies: Dg Chest 2 View  04/05/2015   CLINICAL DATA:  79 year old female with history of cough and shortness of breath.  EXAM: CHEST  2 VIEW  COMPARISON:  Chest x-ray 04/01/2015.  FINDINGS: Right internal jugular single-lumen porta cath with tip terminating in the distal superior vena cava. Left-sided pacemaker in position with lead tips projecting over the  expected location of the right atrium and right ventricular apex. Status post median sternotomy for CABG and mitral valve annuloplasty. Moderate right and small left pleural effusion. Patchy areas of airspace consolidation are noted throughout the right mid to lower lung and in the left lower lung, concerning for multilobar pneumonia. Overall, these are improved compared to prior study 04/01/2015. Small bilateral pleural effusions. No evidence of pulmonary edema. Heart size is normal. Right hilar fullness is similar to the prior examination. Upper mediastinal contours are slightly distorted by patient's rotation to the left. Atherosclerosis in the thoracic aorta.  IMPRESSION: 1. Improving multilobar pneumonia. 2. Small bilateral pleural effusions. 3. Persistent right hilar fullness, similar to the prior study, which may relate in part to scarring from prior radiation therapy. 4. Postoperative changes and support apparatus, as above.   Electronically Signed   By: Vinnie Langton M.D.   On: 04/05/2015 10:07    Scheduled Meds: . antiseptic oral rinse  7 mL Mouth Rinse q12n4p  . apixaban  5 mg Oral BID  . aspirin  81 mg Oral Daily  . atenolol  25 mg Oral Daily  . atorvastatin  20 mg Oral Daily  . azelastine  2 spray Each Nare BID  . budesonide (PULMICORT) nebulizer solution  0.5 mg Nebulization BID  . calcium-vitamin D  1 tablet Oral BID WC  . chlorhexidine  15 mL Mouth Rinse BID  . docusate sodium  100 mg Oral BID  . insulin aspart  0-15 Units Subcutaneous TID WC  . ipratropium-albuterol  3 mL Nebulization Q6H  . levofloxacin  500 mg Oral Daily  . levothyroxine  75 mcg Oral QAC breakfast  . loratadine  10 mg Oral Daily  . pantoprazole  40 mg Oral Daily  . piperacillin-tazobactam (ZOSYN)  IV  3.375 g Intravenous 3 times per day    Assessment/Plan:  1. Acute respiratory failure with hypoxia. Patient currently on 2 L of oxygen,  she does not wear oxygen at home. I will check pulse ox on room air  in the a.m. 2. Clinical sepsis, multi lobar pneumonia bilaterally. Patient on Zosyn and vancomycin. Since blood cultures are negative I will DC the vancomycin. We do need atypical coverage so I will start Levaquin. 3. Elevated troponin- likely demand ischemia secondary to acute respiratory failure sepsis and multi lobar pneumonia. Cardiology to do outpatient testing. 4. History of lung cancer.- Patient states that she is in remission. Last chemotherapy July 6 of last month. 5. Atrial fibrillation- rate controlled on atenolol. Anticoagulated with Eliquis. 6. History of CHF no signs currently. 7. Hyperlipidemia unspecified on atorvastatin. 8. Hypothyroidism unspecified continue levothyroxine.  Code Status:     Code Status Orders  Start     Ordered   04/01/15 1420  Full code   Continuous     04/01/15 1419     Disposition Plan: Home once breathing better  Consultants:  Dr. Rockey Situ cardiology  Time spent: 28 minutes  Loletha Grayer  [facility]

## 2015-04-05 NOTE — Progress Notes (Signed)
Pt is refusing Bipap at this time. She states that she doesn't need it.

## 2015-04-05 NOTE — Progress Notes (Signed)
Pt request not to wear Bipap tonight.

## 2015-04-05 NOTE — Plan of Care (Signed)
Problem: Phase I Progression Outcomes Goal: Pain controlled with appropriate interventions Outcome: Progressing Reports chronic back pain with relief offered with prn Tylenol. Goal: OOB as tolerated unless otherwise ordered Outcome: Progressing Up to bathroom, DOE on room air.  BSC encouraged while on oxygen.

## 2015-04-06 DIAGNOSIS — I482 Chronic atrial fibrillation: Secondary | ICD-10-CM

## 2015-04-06 LAB — GLUCOSE, CAPILLARY
GLUCOSE-CAPILLARY: 114 mg/dL — AB (ref 65–99)
GLUCOSE-CAPILLARY: 113 mg/dL — AB (ref 65–99)
Glucose-Capillary: 110 mg/dL — ABNORMAL HIGH (ref 65–99)
Glucose-Capillary: 124 mg/dL — ABNORMAL HIGH (ref 65–99)

## 2015-04-06 LAB — EXPECTORATED SPUTUM ASSESSMENT W REFEX TO RESP CULTURE

## 2015-04-06 LAB — EXPECTORATED SPUTUM ASSESSMENT W GRAM STAIN, RFLX TO RESP C

## 2015-04-06 MED ORDER — ALPRAZOLAM 0.25 MG PO TABS: 0.2500 mg | ORAL_TABLET | Freq: Three times a day (TID) | ORAL | Status: DC | PRN

## 2015-04-06 MED ORDER — LOSARTAN POTASSIUM 25 MG PO TABS
25.0000 mg | ORAL_TABLET | Freq: Every day | ORAL | Status: DC
  Administered 2015-04-06 – 2015-04-07 (×2): 25 mg via ORAL
  Filled 2015-04-06 (×2): qty 1

## 2015-04-06 NOTE — Progress Notes (Signed)
SATURATION QUALIFICATIONS: (This note is used to comply with regulatory documentation for home oxygen)  Patient Saturations on Room Air at Rest = 88%  Patient Saturations on Room Air while Ambulating = 00%  Patient Saturations on 2 Liters of oxygen while Ambulating = 95%  Please briefly explain why patient needs home oxygen: metastatic lung cancer ( unable to assess room air While ambulating per pt unable to tolerate)

## 2015-04-06 NOTE — Progress Notes (Signed)
Patient Name: Jenny Cooper      SUBJECTIVE:>>79 y.o. female with h/o CAD s/p remote history of MI in 2002 s/p 2 vessel CABG post stenting in 2002, history of mitral valve repair in 2002 secondary to mitral regurgitation, history of PAF s/p prior TEE/DCCV on Eliquis, atypical atrial flutter s/p ablation on 07/27/2013 s/p MDT PPM, carcinoma of right lung, COPD, HTN, and HLD who presented to [facility] on 8/2 with increased SOB and was found to have PNA, possibly post obstructive and elevated troponin.   still sob butfeels much better this afternoon  Past Medical History  Diagnosis Date  . HTN (hypertension)   . Pacemaker     a. MDT 2002; b. generator replacement 2013; c. followed by Dr. Omelia Blackwater, MD  . HLD (hyperlipidemia)   . CAD (coronary artery disease)     a. s/p MI x 2 in 2002 s/p PCI x 2 in 2002; b. s/p 2v CABG 2002; c. stress echo 07/2004 w/ evidence of pos & inf infarct & no evidence of ischemia; d. 4/08 dipyridamold scan w/ multiple areas of infarct, no ischemia, EF 49%     . Lung cancer   . History of colonoscopy 2013  . History of mammography, screening 2015  . History of Papanicolaou smear of cervix 2013  . Carcinoma of right lung 01/03/2015    a. followed by Dr. Oliva Bustard  . Mitral regurgitation     a. s/p mitral ring placement 09/2000; b. echo 09/2010: EF 50%, inf HK, post AK, mild MR, prosthetic mitral valve ring w/ peak gradient of 10 mmHg; b. echo 2/13: EF 50%, mild MR/TR     . History of blood clots     12/2001  . Atypical atrial flutter     a. s/p ablation 07/27/2013  . PAF (paroxysmal atrial fibrillation)     a. on Eliquis   . COPD (chronic obstructive pulmonary disease)     Scheduled Meds:  Scheduled Meds: . antiseptic oral rinse  7 mL Mouth Rinse q12n4p  . apixaban  5 mg Oral BID  . aspirin  81 mg Oral Daily  . atorvastatin  20 mg Oral Daily  . azelastine  2 spray Each Nare BID  . budesonide (PULMICORT) nebulizer solution  0.5 mg Nebulization BID  .  calcium-vitamin D  1 tablet Oral BID WC  . chlorhexidine  15 mL Mouth Rinse BID  . docusate sodium  100 mg Oral BID  . insulin aspart  0-15 Units Subcutaneous TID WC  . ipratropium-albuterol  3 mL Nebulization Q6H  . levofloxacin  500 mg Oral Daily  . levothyroxine  75 mcg Oral QAC breakfast  . loratadine  10 mg Oral Daily  . metoprolol succinate  25 mg Oral BID  . pantoprazole  40 mg Oral Daily  . piperacillin-tazobactam (ZOSYN)  IV  3.375 g Intravenous 3 times per day   Continuous Infusions:  acetaminophen, ALPRAZolam, sodium chloride    PHYSICAL EXAM Filed Vitals:   04/06/15 0506 04/06/15 0754 04/06/15 1038 04/06/15 1157  BP: 129/92   115/88  Pulse: 101   106  Temp: 98 F (36.7 C)   98.4 F (36.9 C)  TempSrc: Oral   Oral  Resp: 28   16  Height:      Weight:      SpO2: 94% 88% 95% 96%    Well developed and nourished in no acute distress HENT normal Neck supple with JVP-flat Carotids brisk and full without  bruits Clear Irregularly irregular rate and rhythm with controlled ventricular response, no murmurs or gallops Abd-soft with active BS without hepatomegaly No Clubbing cyanosis edema Skin-warm and dry A & Oriented  Grossly normal sensory and motor function  TELEMETRY: Reviewed telemetry pt in *afib     Intake/Output Summary (Last 24 hours) at 04/06/15 1349 Last data filed at 04/06/15 1321  Gross per 24 hour  Intake    870 ml  Output   2200 ml  Net  -1330 ml    LABS: Basic Metabolic Panel:  Recent Labs Lab 04/01/15 0822 04/03/15 0643 04/05/15 0135  NA 139  --  141  K 3.8  --  4.1  CL 98*  --  105  CO2 28  --  29  GLUCOSE 327*  --  137*  BUN 22*  --  23*  CREATININE 1.11* 0.87 0.79  CALCIUM 9.0  --  8.8*   Cardiac Enzymes: No results for input(s): CKTOTAL, CKMB, CKMBINDEX, TROPONINI in the last 72 hours. CBC:  Recent Labs Lab 04/01/15 0822 04/02/15 1254 04/03/15 0643 04/04/15 0309 04/05/15 0135  WBC 10.1 9.9 9.0 6.7 5.8  NEUTROABS  7.1*  --   --   --   --   HGB 12.2 10.3* 9.9* 10.2* 11.0*  HCT 38.1 30.8* 30.5* 30.8* 32.6*  MCV 94.9 93.4 93.8 94.0 93.7  PLT 296 210 204 192 214   No results for input(s): TSH, T4TOTAL, T3FREE, THYROIDAB in the last 72 hours.  Invalid input(s): FREET3 Anemia Panel:    ASSESSMENT AND PLAN:  +  Tn CAD prior CABG LV dysfucntion new PAF with hx of ablation of atypical flutter Pneumonia and sepsis post obstructive pattern Atrial fibrilaltion  Tel looks like afib  Will do ECG   Plan is outpt cath>  Tn is hard to interpret, and would be reasonable with sepsis syndrome to reevaluate LV function once infection is resolved as both + Tn and LV dysfunction could also be due to sepsis  Continue apixoban and reasonable for short term to use ASA presuming that this was ACS which is not at all clear as outlined by Dr Gilda Crease and TG  Using  metoprolol with LV dusfunction  will resume losartan 25  Think Pulm consult would be valuable with hx of postobstructive process esp given hx of lung CA  Would call on Monday  I was not able to find [place] on call over the weekend     Signed, Virl Axe MD  04/06/2015

## 2015-04-06 NOTE — Plan of Care (Signed)
Problem: Phase II Progression Outcomes Goal: Wean O2 if indicated Outcome: Not Progressing Pt cont to be short of breath

## 2015-04-06 NOTE — Progress Notes (Signed)
Patient ID: Jenny Cooper, female   DOB: 1935-09-15, 79 y.o.   MRN: [id number] [facility] PROGRESS NOTE  PCP: Gayland Curry, MD  HPI/Subjective: Patient still with shortness of breath and cough. States that she wakes up in the middle of night with shortness of breath which then eases off. She feels like she needs more oxygen.  Objective: Filed Vitals:   04/06/15 1157  BP: 115/88  Pulse: 106  Temp: 98.4 F (36.9 C)  Resp: 16    Filed Weights   04/01/15 0810 04/03/15 0532  Weight: 70.308 kg (155 lb) 76.5 kg (168 lb 10.4 oz)    ROS: Review of Systems  Constitutional: Negative for fever and chills.  Eyes: Negative for blurred vision.  Respiratory: Positive for cough and shortness of breath. Negative for sputum production.   Cardiovascular: Negative for chest pain.  Gastrointestinal: Positive for constipation. Negative for nausea, vomiting, abdominal pain and diarrhea.  Genitourinary: Negative for dysuria.  Musculoskeletal: Positive for back pain. Negative for joint pain.  Neurological: Negative for dizziness and headaches.   Exam: Physical Exam  Constitutional: She is oriented to person, place, and time.  HENT:  Nose: No mucosal edema.  Mouth/Throat: No oropharyngeal exudate or posterior oropharyngeal edema.  Eyes: Conjunctivae, EOM and lids are normal. Pupils are equal, round, and reactive to light.  Neck: No JVD present. Carotid bruit is not present. No edema present. No thyroid mass and no thyromegaly present.  Cardiovascular: S1 normal and S2 normal.  An irregularly irregular rhythm present. Exam reveals no gallop.   Murmur heard.  Systolic murmur is present with a grade of 2/6  Pulses:      Dorsalis pedis pulses are 2+ on the right side, and 2+ on the left side.  Respiratory: No respiratory distress. She has wheezes in the right lower field and the left lower field. She has no rhonchi. She has no rales.  GI: Soft. Bowel sounds are normal.  There is no tenderness.  Musculoskeletal:       Right ankle: She exhibits no swelling.       Left ankle: She exhibits no swelling.  Lymphadenopathy:    She has no cervical adenopathy.  Neurological: She is alert and oriented to person, place, and time. No cranial nerve deficit.  Skin: Skin is warm. No rash noted. Nails show no clubbing.  Psychiatric: She has a normal mood and affect.    Data Reviewed: Basic Metabolic Panel:  Recent Labs Lab 04/01/15 0822 04/03/15 0643 04/05/15 0135  NA 139  --  141  K 3.8  --  4.1  CL 98*  --  105  CO2 28  --  29  GLUCOSE 327*  --  137*  BUN 22*  --  23*  CREATININE 1.11* 0.87 0.79  CALCIUM 9.0  --  8.8*   Liver Function Tests:  Recent Labs Lab 04/01/15 0822  AST 30  ALT 16  ALKPHOS 72  BILITOT 0.6  PROT 7.4  ALBUMIN 4.0    Recent Labs Lab 04/01/15 0822  LIPASE 18*   CBC:  Recent Labs Lab 04/01/15 0822 04/02/15 1254 04/03/15 0643 04/04/15 0309 04/05/15 0135  WBC 10.1 9.9 9.0 6.7 5.8  NEUTROABS 7.1*  --   --   --   --   HGB 12.2 10.3* 9.9* 10.2* 11.0*  HCT 38.1 30.8* 30.5* 30.8* 32.6*  MCV 94.9 93.4 93.8 94.0 93.7  PLT 296 210 204 192 214   Studies: Dg Chest 2 View  04/05/2015  CLINICAL DATA:  79 year old female with history of cough and shortness of breath.  EXAM: CHEST  2 VIEW  COMPARISON:  Chest x-ray 04/01/2015.  FINDINGS: Right internal jugular single-lumen porta cath with tip terminating in the distal superior vena cava. Left-sided pacemaker in position with lead tips projecting over the expected location of the right atrium and right ventricular apex. Status post median sternotomy for CABG and mitral valve annuloplasty. Moderate right and small left pleural effusion. Patchy areas of airspace consolidation are noted throughout the right mid to lower lung and in the left lower lung, concerning for multilobar pneumonia. Overall, these are improved compared to prior study 04/01/2015. Small bilateral pleural effusions.  No evidence of pulmonary edema. Heart size is normal. Right hilar fullness is similar to the prior examination. Upper mediastinal contours are slightly distorted by patient's rotation to the left. Atherosclerosis in the thoracic aorta.  IMPRESSION: 1. Improving multilobar pneumonia. 2. Small bilateral pleural effusions. 3. Persistent right hilar fullness, similar to the prior study, which may relate in part to scarring from prior radiation therapy. 4. Postoperative changes and support apparatus, as above.   Electronically Signed   By: Vinnie Langton M.D.   On: 04/05/2015 10:07    Scheduled Meds: . antiseptic oral rinse  7 mL Mouth Rinse q12n4p  . apixaban  5 mg Oral BID  . aspirin  81 mg Oral Daily  . atorvastatin  20 mg Oral Daily  . azelastine  2 spray Each Nare BID  . budesonide (PULMICORT) nebulizer solution  0.5 mg Nebulization BID  . calcium-vitamin D  1 tablet Oral BID WC  . chlorhexidine  15 mL Mouth Rinse BID  . docusate sodium  100 mg Oral BID  . insulin aspart  0-15 Units Subcutaneous TID WC  . ipratropium-albuterol  3 mL Nebulization Q6H  . levofloxacin  500 mg Oral Daily  . levothyroxine  75 mcg Oral QAC breakfast  . loratadine  10 mg Oral Daily  . metoprolol succinate  25 mg Oral BID  . pantoprazole  40 mg Oral Daily  . piperacillin-tazobactam (ZOSYN)  IV  3.375 g Intravenous 3 times per day    Assessment/Plan:  1. Acute respiratory failure with hypoxia. Patient currently on 2 L of oxygen,  she does not wear oxygen at home. Patient's pulse ox dropped down to 88% off the oxygen with limited movement. 2. Clinical sepsis, multi lobar pneumonia bilaterally. Patient on Zosyn and Levaquin. Off steroids. 3. Elevated troponin- likely demand ischemia secondary to acute respiratory failure sepsis and multi lobar pneumonia. Cardiology to do outpatient testing. 4. History of lung cancer.- Patient states that she is in remission. Last chemotherapy July 6 of last month. 5. Atrial  fibrillation- rate controlled on atenolol. Anticoagulated with Eliquis. 6. History of CHF no signs currently. 7. Hyperlipidemia unspecified on atorvastatin. 8. Hypothyroidism unspecified continue levothyroxine. 9. Possible anxiety- when necessary Xanax ordered  Code Status:     Code Status Orders        Start     Ordered   04/01/15 1420  Full code   Continuous     04/01/15 1419     Disposition Plan: Home once able to get off oxygen  Consultants:  Dr. Rockey Situ cardiology  Time spent: 23 minutes  Loletha Grayer  [facility]

## 2015-04-07 ENCOUNTER — Telehealth: Payer: Self-pay

## 2015-04-07 LAB — CULTURE, BLOOD (ROUTINE X 2)
CULTURE: NO GROWTH
CULTURE: NO GROWTH

## 2015-04-07 LAB — GLUCOSE, CAPILLARY
Glucose-Capillary: 104 mg/dL — ABNORMAL HIGH (ref 65–99)
Glucose-Capillary: 113 mg/dL — ABNORMAL HIGH (ref 65–99)

## 2015-04-07 MED ORDER — IPRATROPIUM-ALBUTEROL 0.5-2.5 (3) MG/3ML IN SOLN: 3.0000 mL | Freq: Four times a day (QID) | RESPIRATORY_TRACT | Status: DC | PRN

## 2015-04-07 MED ORDER — LEVOFLOXACIN 500 MG PO TABS: 500.0000 mg | ORAL_TABLET | Freq: Every day | ORAL | Status: DC

## 2015-04-07 MED ORDER — METOPROLOL SUCCINATE ER 25 MG PO TB24: 25.0000 mg | ORAL_TABLET | Freq: Two times a day (BID) | ORAL | Status: DC

## 2015-04-07 NOTE — Evaluation (Signed)
Physical Therapy Evaluation Patient Details Name: Jenny Cooper MRN: [id number] DOB: December 23, 1935 Today's Date: 04/07/2015   History of Present Illness  presented to ER secondary to cough, SOB; admitted with sepsis related to post-obstructive PNA (requiring BiPAP upon arrival).  Currently on RA, maintaining sats >91% at rest and with exertion.  Clinical Impression  Upon evaluation, patient alert and oriented to basic information; follows all commands and demonstrates good insight/safety awareness.  Bilat UE/LE strength and ROM grossly WFL; history of chronic neuropathy noted in all distal extremities.  Able to complete sit/stand and basic transfers without assist device, sup; gait (220') without assist device, cga/close sup.  No overt buckling, LOB or safety concern; but does demonstrate mod impairment in cardiopulmonary endurance (as noted by BORG 5/10 after above gait; requiring two rest periods to complete distance).  Verbally reviewed need for activity pacing/energy conservation; patient voiced understanding. Able to complete full session on RA with sats >91% both at rest and with activity. Would benefit from skilled PT to address above deficits and promote optimal return to PLOF; Recommend transition to [facility] upon discharge from acute hospitalization for home safety assessment, cardiopulmonary endurance monitoring/training.  Patient/family aware of recommendations and in agreement with plan.     Follow Up Recommendations Home health PT    Equipment Recommendations       Recommendations for Other Services       Precautions / Restrictions Precautions Precautions: Fall Restrictions Weight Bearing Restrictions: No      Mobility  Bed Mobility               General bed mobility comments: seated in recliner beginning/end of session  Transfers Overall transfer level: Needs assistance   Transfers: Sit to/from Stand Sit to Stand: Supervision         General transfer  comment: requires UE support to complete  Ambulation/Gait Ambulation/Gait assistance: Min guard;Supervision Ambulation Distance (Feet): 220 Feet Assistive device: None     Gait velocity interpretation:  (10 walk time, 7-8 seconds) General Gait Details: reciprocal stepping with fair step height/length, fair cadence and overall gait speed.  able to complete simple dynamic gait components without LOB.  Does require two standing rest periods to complete distance due to fatigue.  Stairs Stairs: Yes Stairs assistance: Min guard Stair Management: One rail Right Number of Stairs: 6 General stair comments: self-selecting step to gait pattern with fair/good control and stability with use of single rail; prefers rail and unilateral HHA when descending  Wheelchair Mobility    Modified Rankin (Stroke Patients Only)       Balance Overall balance assessment: Needs assistance Sitting-balance support: No upper extremity supported;Feet supported Sitting balance-Leahy Scale: Good     Standing balance support: No upper extremity supported Standing balance-Leahy Scale: Fair                               Pertinent Vitals/Pain Pain Assessment: No/denies pain    Home Living Family/patient expects to be discharged to:: Private residence Living Arrangements: Children Available Help at Discharge: Family Type of Home: House Home Access: Stairs to enter Entrance Stairs-Rails: Right Entrance Stairs-Number of Steps: 3 Home Layout: Two level;Able to live on main level with bedroom/bathroom        Prior Function Level of Independence: Independent         Comments: Indep with household and limited community mobility without assist device.  Does endorse two falls in previous six months  Hand Dominance        Extremity/Trunk Assessment   Upper Extremity Assessment: Overall WFL for tasks assessed (chronic neuropathy elbows distally)           Lower Extremity  Assessment: Overall WFL for tasks assessed (chronic neuropathy knees distally)         Communication   Communication: No difficulties  Cognition Arousal/Alertness: Awake/alert Behavior During Therapy: WFL for tasks assessed/performed Overall Cognitive Status: Within Functional Limits for tasks assessed                      General Comments General comments (skin integrity, edema, etc.): R chest port    Exercises Other Exercises Other Exercises: Educated on home safety modifications, need for activity pacing/energy conservation; patient voiced understanding. (8 minutes)      Assessment/Plan    PT Assessment Patient needs continued PT services  PT Diagnosis Generalized weakness   PT Problem List Decreased activity tolerance;Decreased balance;Decreased mobility;Decreased knowledge of precautions;Cardiopulmonary status limiting activity  PT Treatment Interventions Gait training;Stair training;Functional mobility training;Therapeutic activities;Therapeutic exercise;Balance training;Patient/family education   PT Goals (Current goals can be found in the Care Plan section) Acute Rehab PT Goals Patient Stated Goal: "to go home" PT Goal Formulation: With patient/family Time For Goal Achievement: 04/21/15 Potential to Achieve Goals: Good    Frequency Min 2X/week   Barriers to discharge Decreased caregiver support      Co-evaluation               End of Session Equipment Utilized During Treatment: Gait belt Activity Tolerance: Patient tolerated treatment well Patient left: in chair;with call bell/phone within reach;with family/visitor present Nurse Communication: Mobility status         Time: 1040-1101 PT Time Calculation (min) (ACUTE ONLY): 21 min   Charges:   PT Evaluation $Initial PT Evaluation Tier I: 1 Procedure PT Treatments $Therapeutic Activity: 8-22 mins   PT G Codes:        Normal Recinos H. Owens Shark, PT, DPT, NCS 04/07/2015, 11:52  AM [phone]

## 2015-04-07 NOTE — Progress Notes (Signed)
Assessment completed.  SL lt fa flushes well.  No distress on 2LO2 per [place].  Cardiac monitor in place, pt denies chest pain.  Lungs with expiratory wheezes scattered bil.  Denies need at this time.  CB in reach, SR up x 2.

## 2015-04-07 NOTE — Discharge Summary (Signed)
Jenny Cooper NAME: Jenny Cooper    MR#:  [id number]  DATE OF BIRTH:  1936-03-09  DATE OF ADMISSION:  04/01/2015 ADMITTING PHYSICIAN: Max Sane, MD  DATE OF DISCHARGE: 04/07/2015  PRIMARY CARE PHYSICIAN: Gayland Curry, MD    ADMISSION DIAGNOSIS:  Bilateral pneumonia [J18.9] Acute respiratory failure with hypoxia and hypercapnia [J96.01] Sepsis, due to unspecified organism [A41.9]  DISCHARGE DIAGNOSIS:  Active Problems:   Sepsis   Acute respiratory failure with hypoxia and hypercapnia   Bilateral pneumonia   Coronary artery disease involving coronary bypass graft of native heart without angina pectoris   NSTEMI (non-ST elevated myocardial infarction)   Paroxysmal supraventricular tachycardia   Blood poisoning   SECONDARY DIAGNOSIS:   Past Medical History  Diagnosis Date  . HTN (hypertension)   . Pacemaker     a. MDT 2002; b. generator replacement 2013; c. followed by Dr. Omelia Blackwater, MD  . HLD (hyperlipidemia)   . CAD (coronary artery disease)     a. s/p MI x 2 in 2002 s/p PCI x 2 in 2002; b. s/p 2v CABG 2002; c. stress echo 07/2004 w/ evidence of pos & inf infarct & no evidence of ischemia; d. 4/08 dipyridamold scan w/ multiple areas of infarct, no ischemia, EF 49%     . Lung cancer   . History of colonoscopy 2013  . History of mammography, screening 2015  . History of Papanicolaou smear of cervix 2013  . Carcinoma of right lung 01/03/2015    a. followed by Dr. Oliva Bustard  . Mitral regurgitation     a. s/p mitral ring placement 09/2000; b. echo 09/2010: EF 50%, inf HK, post AK, mild MR, prosthetic mitral valve ring w/ peak gradient of 10 mmHg; b. echo 2/13: EF 50%, mild MR/TR     . History of blood clots     12/2001  . Atypical atrial flutter     a. s/p ablation 07/27/2013  . PAF (paroxysmal atrial fibrillation)     a. on Eliquis   . COPD (chronic obstructive pulmonary disease)     HOSPITAL COURSE:   1. Acute  respiratory failure with hypoxia. The patient was on oxygen the entire hospital course. On the day of discharge pulse ox stayed above 90% after ambulation and was stable for discharge home. 2. Clinical sepsis, pneumonia bilaterally. The patient was initially started on triple antibiotics then kept on Zosyn and vancomycin. When I saw her on 8/6 she was still not feeling well. I DC'd the vancomycin since cultures were negative and I added Levaquin. She started feeling better once the Levaquin was added. And I will send her home on a complete course of Levaquin. 3. Acute myocardial infarction. Non-STEMI. In my opinion this is likely more demand ischemia from the acute respiratory failure and clinical sepsis. Continue aspirin and metoprolol and follow-up with cardiology as outpatient for consideration of cardiac catheterization. 4. Proximal atrial fibrillation rate controlled on metoprolol and anticoagulated with Eliquis. 5. Hyperlipidemia unspecified continue atorvastatin. 6. History of lung cancer follow-up with Dr. Maryfrances Bunnell as outpatient 7. History of cardiomyopathy and CAD.  DISCHARGE CONDITIONS:   Satisfactory  CONSULTS OBTAINED:  Treatment Team:  Max Sane, MD Minna Merritts, MD Forest Gleason, MD  DRUG ALLERGIES:   Allergies  Allergen Reactions  . Lovenox [Enoxaparin Sodium] Itching  . Meperidine Other (See Comments)    Other Reaction: pt does not like how it makes her feel    DISCHARGE MEDICATIONS:  Current Discharge Medication List    START taking these medications   Details  levofloxacin (LEVAQUIN) 500 MG tablet Take 1 tablet (500 mg total) by mouth daily. Qty: 7 tablet, Refills: 0    metoprolol succinate (TOPROL-XL) 25 MG 24 hr tablet Take 1 tablet (25 mg total) by mouth 2 (two) times daily. Qty: 60 tablet, Refills: 0      CONTINUE these medications which have CHANGED   Details  ipratropium-albuterol (DUONEB) 0.5-2.5 (3) MG/3ML SOLN Take 3 mLs by nebulization every 6  (six) hours as needed. Qty: 360 mL, Refills: 2      CONTINUE these medications which have NOT CHANGED   Details  apixaban (ELIQUIS) 5 MG TABS tablet Take 5 mg by mouth 2 (two) times daily.    aspirin 81 MG tablet Take 81 mg by mouth daily.    atorvastatin (LIPITOR) 20 MG tablet Take 20 mg by mouth daily.    azelastine (ASTELIN) 0.1 % nasal spray Place 2 sprays into both nostrils 2 (two) times daily. Use in each nostril as directed    calcium carbonate (OS-CAL) 600 MG TABS tablet Take 600 mg by mouth 2 (two) times daily with a meal.    fexofenadine ([facility] ALLERGY RELIEF) 180 MG tablet Take 180 mg by mouth daily.    levothyroxine (SYNTHROID, LEVOTHROID) 75 MCG tablet Take 75 mcg by mouth daily before breakfast.    losartan (COZAAR) 25 MG tablet Take 25 mg by mouth daily.    omeprazole (PRILOSEC) 20 MG capsule Take 20 mg by mouth daily.      STOP taking these medications     atenolol (TENORMIN) 25 MG tablet      hydrochlorothiazide (MICROZIDE) 12.5 MG capsule          DISCHARGE INSTRUCTIONS:   Follow-up with [name] 1 week Follow-up with Dr. Rockey Situ one week  If you experience worsening of your admission symptoms, develop shortness of breath, life threatening emergency, suicidal or homicidal thoughts you must seek medical attention immediately by calling 911 or calling your MD immediately  if symptoms less severe.  You Must read complete instructions/literature along with all the possible adverse reactions/side effects for all the Medicines you take and that have been prescribed to you. Take any new Medicines after you have completely understood and accept all the possible adverse reactions/side effects.   Please note  You were cared for by a hospitalist during your hospital stay. If you have any questions about your discharge medications or the care you received while you were in the hospital after you are discharged, you can call the unit and asked to speak with the  hospitalist on call if the hospitalist that took care of you is not available. Once you are discharged, your primary care physician will handle any further medical issues. Please note that NO REFILLS for any discharge medications will be authorized once you are discharged, as it is imperative that you return to your primary care physician (or establish a relationship with a primary care physician if you do not have one) for your aftercare needs so that they can reassess your need for medications and monitor your lab values.    Today   CHIEF COMPLAINT:   Chief Complaint  Patient presents with  . Shortness of Breath    HISTORY OF PRESENT ILLNESS:  Jenny Cooper  is a 79 y.o. female presented with shortness of breath and was found to have bilateral pneumonia.   VITAL SIGNS:  Blood pressure 116/71,  pulse 103, temperature 97.5 F (36.4 C), temperature source Oral, resp. rate 18, height '5\' 7"'$  (1.702 m), weight 76.5 kg (168 lb 10.4 oz), SpO2 94 %.   PHYSICAL EXAMINATION:  GENERAL:  79 y.o.-year-old patient lying in the bed with no acute distress.  EYES: Pupils equal, round, reactive to light and accommodation. No scleral icterus. Extraocular muscles intact.  HEENT: Head atraumatic, normocephalic. Oropharynx and nasopharynx clear.  NECK:  Supple, no jugular venous distention. No thyroid enlargement, no tenderness.  LUNGS: Decreased breath sounds bilaterally, no wheezing, rales,rhonchi or crepitation. No use of accessory muscles of respiration.  CARDIOVASCULAR: S1, S2 tachycardia. No murmurs, rubs, or gallops.  ABDOMEN: Soft, non-tender, non-distended. Bowel sounds present. No organomegaly or mass.  EXTREMITIES: No pedal edema, cyanosis, or clubbing.  NEUROLOGIC: Cranial nerves II through XII are intact. Muscle strength 5/5 in all extremities. Sensation intact. Gait not checked.  PSYCHIATRIC: The patient is alert and oriented x 3.  SKIN: No obvious rash, lesion, or ulcer.   DATA REVIEW:    CBC  Recent Labs Lab 04/05/15 0135  WBC 5.8  HGB 11.0*  HCT 32.6*  PLT 214    Chemistries   Recent Labs Lab 04/01/15 0822  04/05/15 0135  NA 139  --  141  K 3.8  --  4.1  CL 98*  --  105  CO2 28  --  29  GLUCOSE 327*  --  137*  BUN 22*  --  23*  CREATININE 1.11*  < > 0.79  CALCIUM 9.0  --  8.8*  AST 30  --   --   ALT 16  --   --   ALKPHOS 72  --   --   BILITOT 0.6  --   --   < > = values in this interval not displayed.  Management plans discussed with the patient, family and they are in agreement.  CODE STATUS:     Code Status Orders        Start     Ordered   04/01/15 1420  Full code   Continuous     04/01/15 1419      TOTAL TIME TAKING CARE OF THIS PATIENT: 40 minutes.    Loletha Grayer M.D on 04/07/2015 at 10:20 AM  Between 7am to 6pm - Pager - [phone]  After 6pm go to [email or web address] - password [email or web address] [facility]  Office  [phone]  CC: Primary care physician; Gayland Curry, MD

## 2015-04-07 NOTE — Care Management (Addendum)
Per nursing and Dr. Leslye Peer patient's O2 sat at rest found to be 86% on room air.  Please see RN flowsheet

## 2015-04-07 NOTE — Progress Notes (Signed)
Pt discharged to home via wc.  Instructions and rx given to pt.  Questions answered.  No distress.  

## 2015-04-07 NOTE — Discharge Instructions (Signed)
Pneumonia, Adult  Pneumonia is an infection of the lungs. It may be caused by a germ (virus or bacteria). Some types of pneumonia can spread easily from person to person. This can happen when you cough or sneeze.  HOME CARE   Only take medicine as told by your doctor.   Take your medicine (antibiotics) as told. Finish it even if you start to feel better.   Do not smoke.   You may use a vaporizer or humidifier in your room. This can help loosen thick spit (mucus).   Sleep so you are almost sitting up (semi-upright). This helps reduce coughing.   Rest.  A shot (vaccine) can help prevent pneumonia. Shots are often advised for:   People over 65 years old.   Patients on chemotherapy.   People with long-term (chronic) lung problems.   People with immune system problems.  GET HELP RIGHT AWAY IF:    You are getting worse.   You cannot control your cough, and you are losing sleep.   You cough up blood.   Your pain gets worse, even with medicine.   You have a fever.   Any of your problems are getting worse, not better.   You have shortness of breath or chest pain.  MAKE SURE YOU:    Understand these instructions.   Will watch your condition.   Will get help right away if you are not doing well or get worse.  Document Released: 02/02/2008 Document Revised: 11/08/2011 Document Reviewed: 11/06/2010  ExitCare Patient Information 2015 [employer]. This information is not intended to replace advice given to you by your health care provider. Make sure you discuss any questions you have with your health care provider.

## 2015-04-07 NOTE — Care Management Note (Signed)
Case Management Note  Patient Details  Name: Zelina Jimerson MRN: [id number] Date of Birth: [birth year]  Subjective/Objective:                  Patient from home with daughter.  Patient states that she [facility] [place], and is able to obtain all of her medications.  Patient has transportation to all of her appointments.  Patient to be discharged with Home O2.  Will from [facility] notified.  PT recommends for home health PT.  List of agencies provided to patient.  [facility] chosen for PT services.  Corene Cornea from [facility] notified.  CM signing off   Action/Plan:   Expected Discharge Date:                  Expected Discharge Plan:  [place]  In-House Referral:     Discharge planning Services  CM Consult  Post Acute Care Choice:  Home Health Choice offered to:  Patient, Adult Children  DME Arranged:  Oxygen DME Agency:  [place] Arranged:  RN, PT, Nurse's Aide [place] Agency:     Status of Service:  Completed, signed off  Medicare Important Message Given:  Yes-third notification given Date Medicare IM Given:    Medicare IM give by:    Date Additional Medicare IM Given:    Additional Medicare Important Message give by:     If discussed at [place] of Stay Meetings, dates discussed:    Additional Comments:  Beverly Sessions, RN 04/07/2015, 12:59 PM

## 2015-04-07 NOTE — Plan of Care (Signed)
Problem: Phase III Progression Outcomes Goal: O2 sats > or equal to 93% on room air Outcome: Not Progressing Patient pulse ox drops below 90% without oxygen

## 2015-04-07 NOTE — Progress Notes (Signed)
O2 sat sitting started at 86 then progressed to 90.  Ambulated pt in hallway, O2 sat 91-94% while ambulating.  Back to chair, O2 sat 90-91 at rest.

## 2015-04-07 NOTE — Telephone Encounter (Signed)
Patient contacted regarding discharge from [facility] on 04/07/15.  Patient understands to follow up with provider Christell Faith on 8/16 at 1:30pm at [facility] office. Patient understands discharge instructions?  Patient understands medications and regiment?  Patient understands to bring all medications to this visit?   Left message for pt with CB number if any questions. Reviewed TCM questions.

## 2015-04-07 NOTE — Care Management Important Message (Signed)
Important Message  Patient Details  Name: Jenny Cooper MRN: [id number] Date of Birth: 09-Dec-1935   Medicare Important Message Given:  Yes-third notification given    Juliann Pulse A Allmond 04/07/2015, 10:45 AM

## 2015-04-09 ENCOUNTER — Telehealth: Payer: Self-pay

## 2015-04-09 LAB — CULTURE, RESPIRATORY W GRAM STAIN: Culture: NORMAL

## 2015-04-09 LAB — CULTURE, RESPIRATORY

## 2015-04-09 NOTE — Telephone Encounter (Signed)
Pt states that Prilosec is on her list to be taken, and wants to know if she should still take this. Please advise

## 2015-04-09 NOTE — Telephone Encounter (Signed)
S/w pt who states she use to take prilosec but hasn't taken it in "quite some time". States they did give it to her in the hospital. Asking if she needs to continue taking it as it was listed as a medication on discharge paperwork.  Informed pt that since she has not been seen in our office yet, we can not advise. Suggested she contact MD that prescribed prilosec. Can discuss with Jenny Cooper at 8/16 appt. Pt verbalized understanding with no further questions.

## 2015-04-14 ENCOUNTER — Inpatient Hospital Stay: Payer: Medicare Other | Attending: Oncology

## 2015-04-14 ENCOUNTER — Inpatient Hospital Stay (HOSPITAL_BASED_OUTPATIENT_CLINIC_OR_DEPARTMENT_OTHER): Payer: Medicare Other | Admitting: Oncology

## 2015-04-14 VITALS — BP 112/4 | HR 80 | Temp 97.6°F | Resp 18 | Wt 160.2 lb

## 2015-04-14 DIAGNOSIS — C3491 Malignant neoplasm of unspecified part of right bronchus or lung: Secondary | ICD-10-CM

## 2015-04-14 DIAGNOSIS — Z8701 Personal history of pneumonia (recurrent): Secondary | ICD-10-CM

## 2015-04-14 DIAGNOSIS — Z950 Presence of cardiac pacemaker: Secondary | ICD-10-CM | POA: Diagnosis not present

## 2015-04-14 DIAGNOSIS — I480 Paroxysmal atrial fibrillation: Secondary | ICD-10-CM | POA: Insufficient documentation

## 2015-04-14 DIAGNOSIS — Z7901 Long term (current) use of anticoagulants: Secondary | ICD-10-CM | POA: Insufficient documentation

## 2015-04-14 DIAGNOSIS — Z87891 Personal history of nicotine dependence: Secondary | ICD-10-CM | POA: Diagnosis not present

## 2015-04-14 DIAGNOSIS — Z79899 Other long term (current) drug therapy: Secondary | ICD-10-CM | POA: Insufficient documentation

## 2015-04-14 DIAGNOSIS — Z923 Personal history of irradiation: Secondary | ICD-10-CM

## 2015-04-14 DIAGNOSIS — I110 Hypertensive heart disease with heart failure: Secondary | ICD-10-CM | POA: Insufficient documentation

## 2015-04-14 DIAGNOSIS — Z9221 Personal history of antineoplastic chemotherapy: Secondary | ICD-10-CM | POA: Insufficient documentation

## 2015-04-14 DIAGNOSIS — I2510 Atherosclerotic heart disease of native coronary artery without angina pectoris: Secondary | ICD-10-CM

## 2015-04-14 DIAGNOSIS — J449 Chronic obstructive pulmonary disease, unspecified: Secondary | ICD-10-CM | POA: Diagnosis not present

## 2015-04-14 DIAGNOSIS — I5022 Chronic systolic (congestive) heart failure: Secondary | ICD-10-CM | POA: Diagnosis not present

## 2015-04-14 DIAGNOSIS — Z7982 Long term (current) use of aspirin: Secondary | ICD-10-CM | POA: Insufficient documentation

## 2015-04-14 LAB — CBC WITH DIFFERENTIAL/PLATELET
Basophils Absolute: 0 10*3/uL (ref 0–0.1)
Basophils Relative: 1 %
EOS PCT: 3 %
Eosinophils Absolute: 0.1 10*3/uL (ref 0–0.7)
HCT: 33.5 % — ABNORMAL LOW (ref 35.0–47.0)
Hemoglobin: 10.9 g/dL — ABNORMAL LOW (ref 12.0–16.0)
LYMPHS ABS: 0.6 10*3/uL — AB (ref 1.0–3.6)
Lymphocytes Relative: 13 %
MCH: 30.2 pg (ref 26.0–34.0)
MCHC: 32.6 g/dL (ref 32.0–36.0)
MCV: 92.7 fL (ref 80.0–100.0)
Monocytes Absolute: 0.7 10*3/uL (ref 0.2–0.9)
Monocytes Relative: 15 %
Neutro Abs: 3.2 10*3/uL (ref 1.4–6.5)
Neutrophils Relative %: 68 %
Platelets: 204 10*3/uL (ref 150–440)
RBC: 3.62 MIL/uL — ABNORMAL LOW (ref 3.80–5.20)
RDW: 17.6 % — ABNORMAL HIGH (ref 11.5–14.5)
WBC: 4.6 10*3/uL (ref 3.6–11.0)

## 2015-04-14 LAB — COMPREHENSIVE METABOLIC PANEL
ALT: 34 U/L (ref 14–54)
ANION GAP: 7 (ref 5–15)
AST: 33 U/L (ref 15–41)
Albumin: 3.5 g/dL (ref 3.5–5.0)
Alkaline Phosphatase: 67 U/L (ref 38–126)
BUN: 18 mg/dL (ref 6–20)
CO2: 28 mmol/L (ref 22–32)
Calcium: 9.1 mg/dL (ref 8.9–10.3)
Chloride: 103 mmol/L (ref 101–111)
Creatinine, Ser: 0.96 mg/dL (ref 0.44–1.00)
GFR calc non Af Amer: 56 mL/min — ABNORMAL LOW (ref >60)
Glucose, Bld: 114 mg/dL — ABNORMAL HIGH (ref 65–99)
POTASSIUM: 3.8 mmol/L (ref 3.5–5.1)
Sodium: 138 mmol/L (ref 135–145)
Total Bilirubin: 0.8 mg/dL (ref 0.3–1.2)
Total Protein: 6.8 g/dL (ref 6.5–8.1)

## 2015-04-14 LAB — MAGNESIUM: Magnesium: 1.9 mg/dL (ref 1.7–2.4)

## 2015-04-14 NOTE — Progress Notes (Signed)
Patient does not have living will.  Former smoker.  Patient d/c from hospital one week ago for pneumonia. Doing much better.

## 2015-04-15 ENCOUNTER — Ambulatory Visit (INDEPENDENT_AMBULATORY_CARE_PROVIDER_SITE_OTHER): Payer: Medicare Other | Admitting: Physician Assistant

## 2015-04-15 ENCOUNTER — Encounter: Payer: Self-pay | Admitting: Physician Assistant

## 2015-04-15 VITALS — BP 110/54 | HR 69 | Ht 67.0 in | Wt 159.5 lb

## 2015-04-15 DIAGNOSIS — I340 Nonrheumatic mitral (valve) insufficiency: Secondary | ICD-10-CM | POA: Insufficient documentation

## 2015-04-15 DIAGNOSIS — I2510 Atherosclerotic heart disease of native coronary artery without angina pectoris: Secondary | ICD-10-CM | POA: Diagnosis not present

## 2015-04-15 DIAGNOSIS — I471 Supraventricular tachycardia: Secondary | ICD-10-CM | POA: Diagnosis not present

## 2015-04-15 DIAGNOSIS — I5023 Acute on chronic systolic (congestive) heart failure: Secondary | ICD-10-CM | POA: Insufficient documentation

## 2015-04-15 DIAGNOSIS — I4892 Unspecified atrial flutter: Secondary | ICD-10-CM

## 2015-04-15 DIAGNOSIS — I10 Essential (primary) hypertension: Secondary | ICD-10-CM

## 2015-04-15 DIAGNOSIS — I25810 Atherosclerosis of coronary artery bypass graft(s) without angina pectoris: Secondary | ICD-10-CM

## 2015-04-15 DIAGNOSIS — I480 Paroxysmal atrial fibrillation: Secondary | ICD-10-CM

## 2015-04-15 DIAGNOSIS — E785 Hyperlipidemia, unspecified: Secondary | ICD-10-CM

## 2015-04-15 DIAGNOSIS — C3491 Malignant neoplasm of unspecified part of right bronchus or lung: Secondary | ICD-10-CM

## 2015-04-15 DIAGNOSIS — I5022 Chronic systolic (congestive) heart failure: Secondary | ICD-10-CM | POA: Insufficient documentation

## 2015-04-15 DIAGNOSIS — R0602 Shortness of breath: Secondary | ICD-10-CM

## 2015-04-15 MED ORDER — SACUBITRIL-VALSARTAN 24-26 MG PO TABS: 1.0000 | ORAL_TABLET | Freq: Two times a day (BID) | ORAL | Status: DC

## 2015-04-15 NOTE — Progress Notes (Signed)
Cardiology Hospital Follow Up Note:   Date of Encounter: 04/15/2015  ID: Jenny Cooper, DOB 1935/09/03, MRN [id number]  PCP: Gayland Curry, MD Primary Cardiologist: Dr. Rockey Situ, MD  Chief Complaint  Patient presents with  . other    Follow up from [facility]; pneumonia & A-fib. Pt. c/o shortness of breath at times.  Meds reviewed by the patient verbally.      HPI:  79 year old female with history of CAD s/p remote history of MI in 2002 s/p 2 vessel CABG post stenting in 2002, history of mitral valve repair in 2002 secondary to mitral regurgitation, history of PAF s/p prior TEE/DCCV on Eliquis, atypical atrial flutter s/p ablation on 07/27/2013 s/p MDT PPM, carcinoma of right lung, COPD, HTN, and HLD who presented to [facility] on 8/2 with increased SOB and was found to have PNA, possibly post obstructive and elevated troponin.   She has known CAD as above with history of MI x 2 in January of 2002. She underwent PCI x 2 at this time. Per her report she did not tolerate this well which led to her 2 vessel CABG at that time. She underwent stress echo in 2005 that showed evidence of posterior and inferior infarct and no evidence of ischemia. She last underwent ischemic evaluation in 11/2006 via dipyridamole stress test that showed multiple areas of infarct without ischemia. EF 49%. She has undergone multiple echoes over the years that have shown stable EF. Echo in 2012 showed an EF 50%, inferior HK, posteriro AK, mild MR with prosthetic mitral valve ring with peak gradient of 10 mm Hg. Echo in 2013 showed an EF of 50% with mild MR/TR. She has known PAF, previously on warfarin, but has been changed to Eliquis by per primary treating team. She has undergone TEE/DCCV in 2013 for her PAF. In 2014 she was diagnosed with atypical atrial flutter and underwent ablation. She most recently underwent PPM generator change in 2013.   She was diagnosed with stage IIIa squamous cell lung cancer of the right lung  hilum in January 2016. At her last follow up with Dr. Oliva Bustard on 03/05/2015 she had finished concurrent chemoradiation with carboplatinum and Taxol and PET scan showed significant response. She was to proceed with 2nd cycle of therapy, be evaluated by suvivorship clinic and have a follow up PET scan in the next few months.   She had seen her PCP for possible bronchitis and was treated with a Zpack. Unfortunately, her symptoms persisted and ultimately got worse causing her to present to [facility] on 8/2. She complained of increase SOB, cough that was productive of green to yellow sputum, nausea, and vomiting. No chest pain, diaphoresis, presyncope, or syncope. No paresthesias. No edema. Symptoms are similar to her prior MI in 2002, except in 2002 she had bilateral upper extremity paraesthesias.   Upon her arrival she was found to be hypoxic with O2 sats in the low 90s, ultimately requiring BiPAP. CXR showed bibasilar airspace disease right greater than left has progressed significantly from the prior study, worrisome for recurrent carcinoma however pneumonia could also have this appearance. CT chest has been ordered. Troponin was found to be 0.48-->1.83. WBC is quite elevated for her at 10.1 (baseline around 2.9). Echo showed EF 30-35%, severe anterior and infero/posterior wall HK. Left ventricular function parameters were normal, mild to moderate MR. Left atrium was mildly dilated. RV systolic function was normal. Mild to moderate TR. PASP was moderately to severely elevated at 60 mm Hg. She was  started on heparin gtt, vancomycin, and Zosyn. She responded well to treatment and was discharged on Levaquin. She was restarted on her Eliquis 5 mg bid for her PAF along with aspirin 81 mg for presumed possible ACS with planned outpatient cardiac cath.  She is feeling better today since her discharge. She does continue to have a cough when laying supine that is productive of green sputum, though this is improving. Weight is  stable. No orthopnea, lower extremity edema, or early satiety. Energy is coming back. No chest pain, nausea, vomiting, palpitations, presyncope, or syncope. She is tolerating all of her medications as directed.     Past Medical History  Diagnosis Date  . HTN (hypertension)   . Pacemaker     a. MDT 2002; b. generator replacement 2013; c. followed by Dr. Omelia [place], MD  . HLD (hyperlipidemia)   . CAD (coronary artery disease)     a. s/p MI x 2 in 2002 s/p PCI x 2 in 2002; b. s/p 2v CABG 2002; c. stress echo 07/2004 w/ evidence of pos & inf infarct & no evidence of ischemia; d. 4/08 dipyridamold scan w/ multiple areas of infarct, no ischemia, EF 49%     . Lung cancer   . History of colonoscopy 2013  . History of mammography, screening 2015  . History of Papanicolaou smear of cervix 2013  . Carcinoma of right lung 01/03/2015    a. followed by Dr. Oliva Bustard  . Mitral regurgitation     a. s/p mitral ring placement 09/2000; b. echo 09/2010: EF 50%, inf HK, post AK, mild MR, prosthetic mitral valve ring w/ peak gradient of 10 mmHg; b. echo 2/13: EF 50%, mild MR/TR     . History of blood clots     12/2001  . Atypical atrial flutter     a. s/p ablation 07/27/2013  . PAF (paroxysmal atrial fibrillation)     a. on Eliquis   . COPD (chronic obstructive pulmonary disease)   . Chronic systolic CHF (congestive heart failure)     a. echo 03/2015: EF 30-35%, sev ant/inf/pos HK, in mild to mod MR  : Past Surgical History  Procedure Laterality Date  . Appendectomy    . Vaginal hysterectomy    . Cardiac bypass    . Pacemaker insertion N/A   : Family History  Problem Relation Age of Onset  . COPD Mother     sister, and brother  :  reports that she quit smoking about 14 years ago. Her smoking use included Cigarettes. She has a 84 pack-year smoking history. She does not have any smokeless tobacco history on file. She reports that she drinks about 6.0 oz of alcohol per week. She reports that she does not use  illicit drugs.:   Allergies:  Allergies  Allergen Reactions  . Lovenox [Enoxaparin Sodium] Itching  . Meperidine Other (See Comments)    Other Reaction: pt does not like how it makes her feel     Home Medications:  Current Outpatient Prescriptions  Medication Sig Dispense Refill  . apixaban (ELIQUIS) 5 MG TABS tablet Take 5 mg by mouth 2 (two) times daily.    Marland Kitchen aspirin 81 MG tablet Take 81 mg by mouth daily.    Marland Kitchen atorvastatin (LIPITOR) 20 MG tablet Take 20 mg by mouth daily.    Marland Kitchen azelastine (ASTELIN) 0.1 % nasal spray Place 2 sprays into both nostrils 2 (two) times daily. Use in each nostril as directed    . calcium carbonate (  OS-CAL) 600 MG TABS tablet Take 600 mg by mouth 2 (two) times daily with a meal.    . fexofenadine ([employer] ALLERGY RELIEF) 180 MG tablet Take 180 mg by mouth daily.    Marland Kitchen ipratropium-albuterol (DUONEB) 0.5-2.5 (3) MG/3ML SOLN Take 3 mLs by nebulization every 6 (six) hours as needed. 360 mL 2  . levothyroxine (SYNTHROID, LEVOTHROID) 75 MCG tablet Take 75 mcg by mouth daily before breakfast.    . metoprolol succinate (TOPROL-XL) 25 MG 24 hr tablet Take 1 tablet (25 mg total) by mouth 2 (two) times daily. 60 tablet 0  . omeprazole (PRILOSEC) 20 MG capsule Take 20 mg by mouth daily.    . sacubitril-valsartan (ENTRESTO) 24-26 MG Take 1 tablet by mouth 2 (two) times daily. 60 tablet 5   No current facility-administered medications for this visit.   Facility-Administered Medications Ordered in Other Visits  Medication Dose Route Frequency Provider Last Rate Last Dose  . sodium chloride 0.9 % injection 10 mL  10 mL Intravenous PRN Forest Gleason, MD   10 mL at 02/19/15 1000     Review of Systems:  Review of Systems  Constitutional: Positive for malaise/fatigue. Negative for fever, chills, weight loss and diaphoresis.  HENT: Negative for congestion.   Eyes: Negative for discharge and redness.  Respiratory: Positive for cough, sputum production and shortness of  breath. Negative for hemoptysis and wheezing.   Cardiovascular: Negative for chest pain, palpitations, orthopnea, claudication, leg swelling and PND.  Gastrointestinal: Negative for heartburn, nausea, vomiting, abdominal pain, blood in stool and melena.  Genitourinary: Negative for hematuria.  Musculoskeletal: Negative for myalgias and falls.  Skin: Negative for rash.  Neurological: Positive for weakness. Negative for dizziness, sensory change, speech change, focal weakness and headaches.  Endo/Heme/Allergies: Does not bruise/bleed easily.  Psychiatric/Behavioral: The patient is not nervous/anxious.   All other systems reviewed and are negative.    Physical Exam:  Blood pressure 110/54, pulse 69, height '5\' 7"'$  (1.702 m), weight 159 lb 8 oz (72.349 kg). BMI: Body mass index is 24.98 kg/(m^2). General: Pleasant, NAD. Psych: Normal affect. Responds to questions with normal affect.  Neuro: Alert and oriented X 3. Moves all extremities spontaneously. HEENT: Normocephalic, atraumatic. EOM intact bilaterally. Sclera anicteric.  Neck: Trachea midline. Supple without bruits or JVD. Lungs:  Respirations regular and unlabored, CTA bilaterally without wheezing, crackles, or rhonchi.  Heart: Irregular, normal s3, s4. No murmurs, rubs, or gallops.  Abdomen: Soft, non-tender, non-distended, BS + x 4.  Extremities: No clubbing, cyanosis or edema. DP/PT/Radials 2+ and equal bilaterally.   Accessory Clinical Findings:  EKG: NSR with abnormal P axis, 69 bpm, anterolateral TWI  Recent Labs: 04/03/2015: TSH 0.679 04/14/2015: ALT 34; BUN 18; Creatinine, Ser 0.96; Hemoglobin 10.9*; Magnesium 1.9; Platelets 204; Potassium 3.8; Sodium 138  No results found for: CHOL, TRIG, HDL, CHOLHDL, VLDL, LDLCALC, LDLDIRECT  Weights: Wt Readings from Last 3 Encounters:  04/15/15 159 lb 8 oz (72.349 kg)  04/14/15 160 lb 4 oz (72.689 kg)  04/03/15 168 lb 10.4 oz (76.5 kg)    Estimated Creatinine Clearance: 47.7  mL/min (by C-G formula based on Cr of 0.96).   Other studies Reviewed: Additional studies/ records that were reviewed today include: [facility] hospitalization.  Assessment & Plan:  1. CAD s/p CABG as above: -Currently without symptoms of angina -Advised patient she would need cardiac cath at this point to evaluate newly depressed EF coupled with new anterior wall motion abnormality and anterolateral EKG changes -She declines cardiac catheterization at  this time stating last time this was done it led to bypass surgery 2/2 "stent rejection" -Discussed the risks of postponing cardiac catheterization with the patient in detail  -Will pursue initial ischemic evaluation at this time with Lexiscan Myoview to evaluate for high risk ischemia along the anterior myocardium with follow up cardiac catheterization  -Continue Toprol XL 25 mg bid and Lipitor 20 mg -Continue aspirin 81 mg (this is dual therapy with Eliquis 5 mg bid for her Afib)  2. Chronic systolic CHF: -EF 01-60% as above -Euvolemic today -Start Entresto 24/26 mg bid with planned titration as BP allows -Stop losartan (not on ACEi at this time) -Continue Toprol XL 25 mg bid  3. PAF s/p remote TEE/DCCV and atypical atrial flutter s/p ablation 07/27/2013: -CHADSVASc at least 6 (CHF, HTN, age x 2, vascular disease, female), giving her an estimated annual stroke risk of 9.8% -Continue Eliquis 5 mg bid -Will need to hold Eliquis 48 hours prior to cardiac cath, when/if she decides to have this -Continue Toprol XL 25 mg bid -Schedule event monitor to evaluate for increased Afib burden given SOB, though this is likely 2/2 her chronic systolic CHF  4. Sinus node dysfunction: -Status post change-out MDT dual chamber PPM 04/24/2012, S/N: [id number] -Original implant date 10/12/2000 -Get established with EP  5. History of acute respiratory failure with hypoxia and sepsis: -Resolved -Continue nebs prn -Mucinex per PCP  6. History of mitral valve  repair with ring placement in 2002: -Echo 03/2015 showed mild to moderate mitral regurgitation  -Recommend follow up echo in 6 months given her history of repair    7. HTN: -Controlled -Entresto and Toprol XL as above   8. HLD: -Lipitor 20 mg   9. Lung carcinoma: -Followed by oncology -Perhaps this is playing a role in her SOB as well    Dispo: -Follow up 1 month  Current medicines are reviewed at length with the patient today.  The patient did not have any concerns regarding medicines.   Christell Faith, PA-C [address] [place], [place] [address] [phone] [place] 04/15/2015, 7:27 PM

## 2015-04-15 NOTE — Patient Instructions (Signed)
Medication Instructions:  Your physician has recommended you make the following change in your medication:  STOP taking losartan START taking entresto 24/26 twice per day   Labwork: none  Testing/Procedures: Your physician has requested that you have a lexiscan myoview.  [place]  Your caregiver has ordered a Stress Test with nuclear imaging. The purpose of this test is to evaluate the blood supply to your heart muscle. This procedure is referred to as a "Non-Invasive Stress Test." This is because other than having an IV started in your vein, nothing is inserted or "invades" your body. Cardiac stress tests are done to find areas of poor blood flow to the heart by determining the extent of coronary artery disease (CAD). Some patients exercise on a treadmill, which naturally increases the blood flow to your heart, while others who are  unable to walk on a treadmill due to physical limitations have a pharmacologic/chemical stress agent called Lexiscan . This medicine will mimic walking on a treadmill by temporarily increasing your coronary blood flow.   Please note: these test may take anywhere between 2-4 hours to complete  PLEASE REPORT TO [place] TO GO  Date of Procedure: Thursday, August 18, 7:30am Arrival Time for Procedure: 7:15am  Instructions regarding medication:     __xx__:  Hold metoprolol the night before procedure and morning of procedure   PLEASE NOTIFY THE OFFICE AT LEAST 24 HOURS IN ADVANCE IF YOU ARE UNABLE TO KEEP YOUR APPOINTMENT.  [phone] AND  PLEASE NOTIFY NUCLEAR MEDICINE AT [facility] AT LEAST 24 HOURS IN ADVANCE IF YOU ARE UNABLE TO KEEP YOUR APPOINTMENT. [phone]  How to prepare for your Myoview test:   Do not eat or drink after midnight  No caffeine for 24 hours prior to test  No smoking 24 hours prior to test.  Your medication may be taken with water.  If your doctor  stopped a medication because of this test, do not take that medication.  Ladies, please do not wear dresses.  Skirts or pants are appropriate. Please wear a short sleeve shirt.  No perfume, cologne or lotion.  Wear comfortable walking shoes. No heels!          Your physician has recommended that you wear a holter monitor. Holter monitors are medical devices that record the heart's electrical activity. Doctors most often use these monitors to diagnose arrhythmias. Arrhythmias are problems with the speed or rhythm of the heartbeat. The monitor is a small, portable device. You can wear one while you do your normal daily activities. This is usually used to diagnose what is causing palpitations/syncope (passing out).    Follow-Up: Your physician recommends that you schedule a follow-up appointment in: one month with Christell Faith, PA-C   Any Other Special Instructions Will Be Listed Below (If Applicable).  Nuclear Medicine Exam A nuclear medicine exam is a safe and painless imaging test. It helps to detect and diagnose disease in the body as well as provide information about organ function and structure.  Nuclear scans are most often done of the:  Lungs.  Heart.  Thyroid gland.  Bones.  Abdomen. HOW A NUCLEAR MEDICINE EXAM WORKS A nuclear medicine exam works by using a radioactive tracer. The material is given either by an IV (intravenous) injection or it may be swallowed. After the tracer is in the body, it is absorbed by your body's organs. A large scanning machine that uses a  special camera detects the radioactivity in your body. A computerized image is then formed regarding the area of concern. The small amounts of radioactive material used in a nuclear medicine exam are found to be medically safe. However, because radioactive material is used, this test is not done if you are pregnant or nursing.  BEFORE THE PROCEDURE  If available, bring previous imaging studies such as x-rays,  etc. with you to the exam.  Arrive early for your exam. PROCEDURE  An IV may be started before the exam begins.  Depending on the type of examination, will lie on a table or sit in a chair during the exam.  The nuclear medicine exam will take about 30 to 60 minutes to complete. AFTER THE PROCEDURE  After your scan is completed, the image(s) will be evaluated by a specialist. It is important that you follow up with your caregiver to find out your test results.  You may return to your regular activity as instructed by your caregiver. SEEK IMMEDIATE MEDICAL CARE IF: You have shortness of breath or difficulty breathing. MAKE SURE YOU:   Understand these instructions.  Will watch your condition.  Will get help right away if you are not doing well or get worse. Document Released: 09/23/2004 Document Revised: 11/08/2011 Document Reviewed: 11/07/2008 [facility] Patient Information 2015 [place], [place]. This information is not intended to replace advice given to you by your health care provider. Make sure you discuss any questions you have with your health care provider. Cardiac Event Monitoring A cardiac event monitor is a small recording device used to help detect abnormal heart rhythms (arrhythmias). The monitor is used to record heart rhythm when noticeable symptoms such as the following occur:  Fast heartbeats (palpitations), such as heart racing or fluttering.  Dizziness.  Fainting or light-headedness.  Unexplained weakness. The monitor is wired to two electrodes placed on your chest. Electrodes are flat, sticky disks that attach to your skin. The monitor can be worn for up to 30 days. You will wear the monitor at all times, except when bathing.  HOW TO USE YOUR CARDIAC EVENT MONITOR A technician will prepare your chest for the electrode placement. The technician will show you how to place the electrodes, how to work the monitor, and how to replace the batteries. Take time to  practice using the monitor before you leave the office. Make sure you understand how to send the information from the monitor to your health care provider. This requires a telephone with a landline, not a cell phone. You need to:  Wear your monitor at all times, except when you are in water:  Do not get the monitor wet.  Take the monitor off when bathing. Do not swim or use a hot tub with it on.  Keep your skin clean. Do not put body lotion or moisturizer on your chest.  Change the electrodes daily or any time they stop sticking to your skin. You might need to use tape to keep them on.  It is possible that your skin under the electrodes could become irritated. To keep this from happening, try to put the electrodes in slightly different places on your chest. However, they must remain in the area under your left breast and in the upper right section of your chest.  Make sure the monitor is safely clipped to your clothing or in a location close to your body that your health care provider recommends.  Press the button to record when you feel symptoms of  heart trouble, such as dizziness, weakness, light-headedness, palpitations, thumping, shortness of breath, unexplained weakness, or a fluttering or racing heart. The monitor is always on and records what happened slightly before you pressed the button, so do not worry about being too late to get good information.  Keep a diary of your activities, such as walking, doing chores, and taking medicine. It is especially important to note what you were doing when you pushed the button to record your symptoms. This will help your health care provider determine what might be contributing to your symptoms. The information stored in your monitor will be reviewed by your health care provider alongside your diary entries.  Send the recorded information as recommended by your health care provider. It is important to understand that it will take some time for your  health care provider to process the results.  Change the batteries as recommended by your health care provider. SEEK IMMEDIATE MEDICAL CARE IF:   You have chest pain.  You have extreme difficulty breathing or shortness of breath.  You develop a very fast heartbeat that persists.  You develop dizziness that does not go away.  You faint or constantly feel you are about to faint. Document Released: 05/25/2008 Document Revised: 12/31/2013 Document Reviewed: 02/12/2013 [facility] Patient Information 2015 [place], [place]. This information is not intended to replace advice given to you by your health care provider. Make sure you discuss any questions you have with your health care provider.

## 2015-04-17 ENCOUNTER — Encounter
Admission: RE | Admit: 2015-04-17 | Discharge: 2015-04-17 | Disposition: A | Discharge status: Home / Self Care | Payer: Medicare Other | Source: Ambulatory Visit | Attending: Physician Assistant | Admitting: Physician Assistant

## 2015-04-17 DIAGNOSIS — R938 Abnormal findings on diagnostic imaging of other specified body structures: Secondary | ICD-10-CM | POA: Insufficient documentation

## 2015-04-17 DIAGNOSIS — I471 Supraventricular tachycardia: Secondary | ICD-10-CM | POA: Diagnosis present

## 2015-04-17 DIAGNOSIS — I252 Old myocardial infarction: Secondary | ICD-10-CM | POA: Diagnosis not present

## 2015-04-17 DIAGNOSIS — R0602 Shortness of breath: Secondary | ICD-10-CM

## 2015-04-17 MED ORDER — REGADENOSON 0.4 MG/5ML IV SOLN
0.4000 mg | Freq: Once | INTRAVENOUS | Status: AC
  Administered 2015-04-17: 0.4 mg via INTRAVENOUS

## 2015-04-17 MED ORDER — TECHNETIUM TC 99M SESTAMIBI - CARDIOLITE
30.0000 | Freq: Once | INTRAVENOUS | Status: AC | PRN
  Administered 2015-04-17: 08:00:00 30.63 via INTRAVENOUS

## 2015-04-17 MED ORDER — TECHNETIUM TC 99M SESTAMIBI - CARDIOLITE
10.0000 | Freq: Once | INTRAVENOUS | Status: AC | PRN
Start: 2015-04-17 — End: 2015-04-17
  Administered 2015-04-17: 13.63 via INTRAVENOUS

## 2015-04-18 ENCOUNTER — Telehealth: Payer: Self-pay | Admitting: *Deleted

## 2015-04-18 ENCOUNTER — Other Ambulatory Visit: Payer: Self-pay

## 2015-04-18 DIAGNOSIS — Z01812 Encounter for preprocedural laboratory examination: Secondary | ICD-10-CM

## 2015-04-18 LAB — NM MYOCAR MULTI W/SPECT W/WALL MOTION / EF
LV dias vol: 91 mL
LV sys vol: 55 mL
Peak HR: 111 {beats}/min
Percent HR: 77 %
Rest HR: 91 {beats}/min
SDS: 1
SRS: 11
SSS: 12

## 2015-04-18 NOTE — Telephone Encounter (Signed)
S/w pt who states she is agreeable to cardiac cath 8/29, 12:30 Pt verbalizes understanding to have labs drawn at hospital next week. Indicates she will pick up envelope from [place] with information regarding cath. Pt had no further questions.

## 2015-04-18 NOTE — Telephone Encounter (Signed)
Wants to know if she can r/s her heart cath to 04/28/15.

## 2015-04-18 NOTE — Patient Instructions (Signed)
Your physician has requested that you have a cardiac catheterization. Cardiac catheterization is used to diagnose and/or treat various heart conditions. Doctors may recommend this procedure for a number of different reasons. The most common reason is to evaluate chest pain. Chest pain can be a symptom of coronary artery disease (CAD), and cardiac catheterization can show whether plaque is narrowing or blocking your heart's arteries. This procedure is also used to evaluate the valves, as well as measure the blood flow and oxygen levels in different parts of your heart.  Please follow instruction sheet, as given.  [facility] Cardiac Cath Instructions   You are scheduled for a Cardiac Cath on: Monday, August 29, 12:30pm  Please arrive at  11:30am on the day of your procedure  Do not eat/drink anything after midnight  Someone will need to drive you home  It is recommended someone be with you for the first 24 hours after your procedure  Wear clothes that are easy to get on/off and wear slip on shoes if possible    Medications bring a current list of all medications with you   _xx__ Do not take Eliquis for two days prior to your cath.  Day of your procedure: Arrive at the [facility] entrance.  Free valet service is available.  After entering the [place] please check-in at the registration desk (1st desk on your right) to receive your armband. After receiving your armband someone will escort you to the cardiac cath/special procedures waiting area.  The usual length of stay after your procedure is about 2 to 3 hours.  This can vary.  If you have any questions, please call our office at [phone], or you may call the cardiac cath lab at [facility] directly at [phone]

## 2015-04-19 ENCOUNTER — Ambulatory Visit (INDEPENDENT_AMBULATORY_CARE_PROVIDER_SITE_OTHER): Payer: Medicare Other

## 2015-04-19 DIAGNOSIS — I4892 Unspecified atrial flutter: Secondary | ICD-10-CM | POA: Diagnosis not present

## 2015-04-22 ENCOUNTER — Telehealth: Payer: Self-pay | Admitting: *Deleted

## 2015-04-22 ENCOUNTER — Telehealth: Payer: Self-pay

## 2015-04-22 NOTE — Telephone Encounter (Signed)
S/w Sarah at [employer] (Medicare) on 8/19 regarding prior auth for entresto. Pt approved $198 co-pay Requested Tiering Exception Jenny Cooper states she will send request to their pharmacy.  S/w pt pharmacy today who states co-pay is now $45

## 2015-04-22 NOTE — Telephone Encounter (Signed)
Pt calling asking when should she go get her labs done for cath that is on Monday. Pt is asking since she lives [place] and wants to know if she can it done [place] at [place].  Please advise.

## 2015-04-23 ENCOUNTER — Other Ambulatory Visit: Payer: Self-pay | Admitting: Physician Assistant

## 2015-04-23 NOTE — Telephone Encounter (Signed)
S/w son Jenny Cooper regarding labs in [place]. Informed son that [facility] has a [facility] that they may go to for pre-cath labs. If any questions, call us when they they get to [place]. Son verbalized understanding with no further questions.

## 2015-04-24 ENCOUNTER — Other Ambulatory Visit: Payer: Self-pay

## 2015-04-24 ENCOUNTER — Telehealth: Payer: Self-pay

## 2015-04-24 DIAGNOSIS — Z01812 Encounter for preprocedural laboratory examination: Secondary | ICD-10-CM

## 2015-04-24 LAB — PROTIME-INR
INR: 1.2 (ref 0.8–1.2)
Prothrombin Time: 11.9 s (ref 9.1–12.0)

## 2015-04-24 LAB — CBC WITH DIFFERENTIAL/PLATELET
BASOS: 1 %
Basophils Absolute: 0 10*3/uL (ref 0.0–0.2)
EOS (ABSOLUTE): 0.5 10*3/uL — AB (ref 0.0–0.4)
EOS: 14 %
HEMATOCRIT: 38.2 % (ref 34.0–46.6)
Hemoglobin: 11.9 g/dL (ref 11.1–15.9)
IMMATURE GRANS (ABS): 0 10*3/uL (ref 0.0–0.1)
IMMATURE GRANULOCYTES: 0 %
Lymphocytes Absolute: 0.6 10*3/uL — ABNORMAL LOW (ref 0.7–3.1)
Lymphs: 19 %
MCH: 29.5 pg (ref 26.6–33.0)
MCHC: 31.2 g/dL — ABNORMAL LOW (ref 31.5–35.7)
MCV: 95 fL (ref 79–97)
MONOS ABS: 0.5 10*3/uL (ref 0.1–0.9)
Monocytes: 14 %
Neutrophils Absolute: 1.8 10*3/uL (ref 1.4–7.0)
Neutrophils: 52 %
PLATELETS: 263 10*3/uL (ref 150–379)
RBC: 4.03 x10E6/uL (ref 3.77–5.28)
RDW: 16.6 % — AB (ref 12.3–15.4)
WBC: 3.5 10*3/uL (ref 3.4–10.8)

## 2015-04-24 LAB — BASIC METABOLIC PANEL
BUN / CREAT RATIO: 14 (ref 11–26)
BUN: 11 mg/dL (ref 8–27)
CALCIUM: 9.2 mg/dL (ref 8.7–10.3)
CHLORIDE: 100 mmol/L (ref 97–108)
CO2: 25 mmol/L (ref 18–29)
Creatinine, Ser: 0.79 mg/dL (ref 0.57–1.00)
GFR, EST AFRICAN AMERICAN: 84 mL/min/{1.73_m2} (ref >59)
GFR, EST NON AFRICAN AMERICAN: 72 mL/min/{1.73_m2} (ref >59)
Glucose: 105 mg/dL — ABNORMAL HIGH (ref 65–99)
Potassium: 4.2 mmol/L (ref 3.5–5.2)
Sodium: 144 mmol/L (ref 134–144)

## 2015-04-24 NOTE — Telephone Encounter (Signed)
S/w pt regarding Cardiac Cath 8/29  Pt verbalized understanding to arrive at 11:30, [place] and to hold eliquis 2 days prior. Reviewed all instructions. Pt had no further questions.

## 2015-04-26 ENCOUNTER — Encounter: Payer: Self-pay | Admitting: Oncology

## 2015-04-26 NOTE — Progress Notes (Signed)
[place] @ [facility] [phone]  [phone]     Jenny Cooper OB: 1936-08-03  MR#: [id number]  MIW#:[id number]  Patient Care Team: Gayland Curry, MD as PCP - General (Family Medicine)  CHIEF COMPLAINT:  Chief Complaint  Patient presents with  . 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   Patient had compression of the right mainstem bronchus because of enlarged lymph node and a mass. Stage is T4 N1 M0 tumor stage IIIa diagnosis in January of 2016 2.started on radiation and chemotherapy from October 21, 2014 3.finished 6 cycles of carboplatinum and Taxol  in March  29 th of 2016, As finished concurrent chemoradiation therapy and PET scan shows significant response 4. started on consolidation chemotherapy with carboplatinum and Taxol 2 cycle   Patient finished 2 cycles of consultation therapy on March 06, 2015   5.  Atrial fibrillation diagnosis in August of 2016 on  eloquis     Oncology Flowsheet 02/05/2015 02/26/2015 03/05/2015 04/01/2015 04/02/2015 04/03/2015 04/04/2015  Day, Cycle Day 1, Cycle 2 Day 1, Cycle 1 Day 1, Cycle 1 - - - -  CARBOplatin (PARAPLATIN) IV - 390 mg - - - - -  dexamethasone (DECADRON) IV [ 20 mg ] [ 12 mg ] [ 20 mg ] - - - -  fosaprepitant (EMEND) IV - [ 150 mg ] - - - - -  methylPREDNISolone sodium succinate 40 mg/mL (SOLU-MEDROL) IV - - - 40 mg 40 mg 40 mg -  ondansetron (ZOFRAN) IJ - - - - - - -  ondansetron (ZOFRAN) IV [ 16 mg ] - [ 16 mg ] - - - -  PACLitaxel (TAXOL) IV 80 mg/m2 144 mg 144 mg - - - -  palonosetron (ALOXI) IV - 0.25 mg - - - - -  predniSONE (DELTASONE) PO - - - - - - 20 mg    INTERVAL HISTORY: 79 year old lady was finished chemoradiation therapy came today further follow-up regarding carcinoma of lung.   August 2 016 Patient is here for  further follow-up.  Was admitted in the hospital with pneumonia from which patient is gradually recovering.  On antibiotic which she is finishing up in next few days.  Cough and shortness of breath and fever has improved.  Previous CT scan during hospitalization has been reviewed.  No chest pain.  No hemoptysis.  Appetite has been gradually getting better. REVIEW OF SYSTEMS:    general status: Patient is feeling weak and tired.  No change in a performance status.  No chills.  No fever. HEENT: Alopecia.  No evidence of stomatitis Lungs: No cough or shortness of breath Cardiac: No chest pain or paroxysmal nocturnal dyspnea GI: No nausea no vomiting no diarrhea no abdominal pain Skin: No rash Lower extremity no swelling Neurological system: Grade 1 neuropathy  No other focal signs  Musculoskeletal system no bony pains  As per HPI. Otherwise, a complete review of systems is negatve.  PAST MEDICAL HISTORY: Past Medical History  Diagnosis Date  . HTN (hypertension)   . Pacemaker     a. MDT 2002; b. generator replacement 2013; c. followed by Dr. Omelia Blackwater, MD  . HLD (hyperlipidemia)   . CAD (coronary artery disease)     a. s/p MI x 2 in 2002 s/p PCI x 2 in  2002; b. s/p 2v CABG 2002; c. stress echo 07/2004 w/ evidence of pos & inf infarct & no evidence of ischemia; d. 4/08 dipyridamold scan w/ multiple areas of infarct, no ischemia, EF 49%     . Lung cancer   . History of colonoscopy 2013  . History of mammography, screening 2015  . History of Papanicolaou smear of cervix 2013  . Carcinoma of right lung 01/03/2015    a. followed by Dr. Oliva Bustard  . Mitral regurgitation     a. s/p mitral ring placement 09/2000; b. echo 09/2010: EF 50%, inf HK, post AK, mild MR, prosthetic mitral valve ring w/ peak gradient of 10 mmHg; b. echo 2/13: EF 50%, mild MR/TR     . History of blood clots     12/2001  . Atypical atrial flutter     a. s/p ablation 07/27/2013  . PAF (paroxysmal atrial fibrillation)     a. on  Eliquis   . COPD (chronic obstructive pulmonary disease)   . Chronic systolic CHF (congestive heart failure)     a. echo 03/2015: EF 30-35%, sev ant/inf/pos HK, in mild to mod MR    PAST SURGICAL HISTORY: Past Surgical History  Procedure Laterality Date  . Appendectomy    . Vaginal hysterectomy    . Cardiac bypass    . Pacemaker insertion N/A     FAMILY HISTORY Family History  Problem Relation Age of Onset  . COPD Mother     sister, and brother        ADVANCED DIRECTIVES:Patient does not have any advanced healthcare directive. Information has been given. HEALTH MAINTENANCE: Social History  Substance Use Topics  . Smoking status: Former Smoker -- 2.00 packs/day for 42 years    Types: Cigarettes    Quit date: 08/30/2000  . Smokeless tobacco: None     Comment: quit smoking in 08/28/2000  . Alcohol Use: 6.0 oz/week    10 Glasses of wine per week     Comment: 2 glasses of wine per day     Allergies  Allergen Reactions  . Lovenox [Enoxaparin Sodium] Itching  . Meperidine Other (See Comments)    Other Reaction: pt does not like how it makes her feel    Current Outpatient Prescriptions  Medication Sig Dispense Refill  . apixaban (ELIQUIS) 5 MG TABS tablet Take 5 mg by mouth 2 (two) times daily.    Marland Kitchen aspirin 81 MG tablet Take 81 mg by mouth daily.    Marland Kitchen atorvastatin (LIPITOR) 20 MG tablet Take 20 mg by mouth daily.    Marland Kitchen azelastine (ASTELIN) 0.1 % nasal spray Place 2 sprays into both nostrils 2 (two) times daily. Use in each nostril as directed    . calcium carbonate (OS-CAL) 600 MG TABS tablet Take 600 mg by mouth 2 (two) times daily with a meal.    . fexofenadine ([employer] ALLERGY RELIEF) 180 MG tablet Take 180 mg by mouth daily.    Marland Kitchen ipratropium-albuterol (DUONEB) 0.5-2.5 (3) MG/3ML SOLN Take 3 mLs by nebulization every 6 (six) hours as needed. 360 mL 2  . levothyroxine (SYNTHROID, LEVOTHROID) 75 MCG tablet Take 75 mcg by mouth daily before breakfast.    . metoprolol  succinate (TOPROL-XL) 25 MG 24 hr tablet Take 1 tablet (25 mg total) by mouth 2 (two) times daily. 60 tablet 0  . omeprazole (PRILOSEC) 20 MG capsule Take 20 mg by mouth daily.    Marland Kitchen levofloxacin (LEVAQUIN) 500 MG tablet Take 500 mg by mouth  daily.  0  . sacubitril-valsartan (ENTRESTO) 24-26 MG Take 1 tablet by mouth 2 (two) times daily. 60 tablet 5   No current facility-administered medications for this visit.   Facility-Administered Medications Ordered in Other Visits  Medication Dose Route Frequency Provider Last Rate Last Dose  . sodium chloride 0.9 % injection 10 mL  10 mL Intravenous PRN Forest Gleason, MD   10 mL at 02/19/15 1000    OBJECTIVE:  Filed Vitals:   04/14/15 1442  BP: 112/4  Pulse: 80  Temp: 97.6 F (36.4 C)  Resp: 18     Body mass index is 25.09 kg/(m^2).    ECOG FS:1 - Symptomatic but completely ambulatory  PHYSICAL EXAM: GENERAL:  Well developed, well nourished, sitting comfortably in the exam room in no acute distress. MENTAL STATUS:  Alert and oriented to person, place and time. HEAD: alopecia Normocephalic, atraumatic, face symmetric, no Cushingoid features. EYES: eyes.  Pupils equal round and reactive to light and accomodation.  No conjunctivitis or scleral icterus. ENT:  Oropharynx clear without lesion.  Tongue normal. Mucous membranes moist.  RESPIRATORY:  Clear to auscultation without rales, wheezes or rhonchi. CARDIOVASCULAR:  Regular rate and rhythm without murmur, rub or gallop. . ABDOMEN:  Soft, non-tender, with active bowel sounds, and no hepatosplenomegaly.  No masses. BACK:  No CVA tenderness.  No tenderness on percussion of the back or rib cage. SKIN:  No rashes, ulcers or lesions. EXTREMITIES: No edema, no skin discoloration or tenderness.  No palpable cords. LYMPH NODES: No palpable cervical, supraclavicular, axillary or inguinal adenopathy  NEUROLOGICAL: Unremarkable. PSYCH:  Appropriate.   LAB RESULTS:  Appointment on 04/14/2015    Component Date Value Ref Range Status  . WBC 04/14/2015 4.6  3.6 - 11.0 K/uL Final  . RBC 04/14/2015 3.62* 3.80 - 5.20 MIL/uL Final  . Hemoglobin 04/14/2015 10.9* 12.0 - 16.0 g/dL Final  . HCT 04/14/2015 33.5* 35.0 - 47.0 % Final  . MCV 04/14/2015 92.7  80.0 - 100.0 fL Final  . MCH 04/14/2015 30.2  26.0 - 34.0 pg Final  . MCHC 04/14/2015 32.6  32.0 - 36.0 g/dL Final  . RDW 04/14/2015 17.6* 11.5 - 14.5 % Final  . Platelets 04/14/2015 204  150 - 440 K/uL Final  . Neutrophils Relative % 04/14/2015 68   Final  . Neutro Abs 04/14/2015 3.2  1.4 - 6.5 K/uL Final  . Lymphocytes Relative 04/14/2015 13   Final  . Lymphs Abs 04/14/2015 0.6* 1.0 - 3.6 K/uL Final  . Monocytes Relative 04/14/2015 15   Final  . Monocytes Absolute 04/14/2015 0.7  0.2 - 0.9 K/uL Final  . Eosinophils Relative 04/14/2015 3   Final  . Eosinophils Absolute 04/14/2015 0.1  0 - 0.7 K/uL Final  . Basophils Relative 04/14/2015 1   Final  . Basophils Absolute 04/14/2015 0.0  0 - 0.1 K/uL Final  . Sodium 04/14/2015 138  135 - 145 mmol/L Final  . Potassium 04/14/2015 3.8  3.5 - 5.1 mmol/L Final  . Chloride 04/14/2015 103  101 - 111 mmol/L Final  . CO2 04/14/2015 28  22 - 32 mmol/L Final  . Glucose, Bld 04/14/2015 114* 65 - 99 mg/dL Final  . BUN 04/14/2015 18  6 - 20 mg/dL Final  . Creatinine, Ser 04/14/2015 0.96  0.44 - 1.00 mg/dL Final  . Calcium 04/14/2015 9.1  8.9 - 10.3 mg/dL Final  . Total Protein 04/14/2015 6.8  6.5 - 8.1 g/dL Final  . Albumin 04/14/2015 3.5  3.5 -  5.0 g/dL Final  . AST 04/14/2015 33  15 - 41 U/L Final  . ALT 04/14/2015 34  14 - 54 U/L Final  . Alkaline Phosphatase 04/14/2015 67  38 - 126 U/L Final  . Total Bilirubin 04/14/2015 0.8  0.3 - 1.2 mg/dL Final  . GFR calc non Af Amer 04/14/2015 56* >60 mL/min Final  . GFR calc Af Amer 04/14/2015 >60  >60 mL/min Final   Comment: (NOTE) The eGFR has been calculated using the CKD EPI equation. This calculation has not been validated in all clinical  situations. eGFR's persistently <60 mL/min signify possible Chronic Kidney Disease.   . Anion gap 04/14/2015 7  5 - 15 Final  . Magnesium 04/14/2015 1.9  1.7 - 2.4 mg/dL Final       Assessment and plan Proceed with second cycle of chemotherapy Stage III carcinoma of lung with good response to initial induction chemotherapy and radiation therapy   Recently patient was admitted in the hospital with pneumonia from the patient is now recovering. Patient will finish antibiotic course Repeat PET scan would be done in next few months for reassessment Patient was advised to call me if spikes any high fever all develops any cough with expectoration for early intervention with antibiotics  During hospital stay patient has developed atrial fibrillation on   eloquis All lab data has been reviewed. The side effect was  grade 1 neuropathy Patient would be evaluated by our survivorship clinic.  Plan would be to repeat PET scan in next few months and follow patient accordingly   To nccn guide lines 1.  History physical and chest CT scan with contrast 6-12 months for 2 years then history physical and noncontrast enhanced CT scan annually 2.  Smoking cesarean assessment each visit 3.  Immunization with influenza vaccine annually.  Herpes zoster vaccine.  And pneumococcal vaccination with appropriate revaccination 4.  Maintain healthy weight and of physical activity consume healthy diet limit consumption of alcohol Routine blood pressure checkup cholesterol checkup and glucose monitoring.   Forest Gleason, MD   04/26/2015 12:37 PM

## 2015-04-28 ENCOUNTER — Encounter: Payer: Self-pay | Admitting: *Deleted

## 2015-04-28 ENCOUNTER — Encounter
Admission: RE | Disposition: A | Discharge status: Home / Self Care | Payer: Self-pay | Source: Ambulatory Visit | Attending: Cardiovascular Disease

## 2015-04-28 ENCOUNTER — Ambulatory Visit
Admission: RE | Admit: 2015-04-28 | Discharge: 2015-04-28 | Disposition: A | Discharge status: Home / Self Care | Payer: Medicare Other | Source: Ambulatory Visit | Attending: Cardiovascular Disease | Admitting: Cardiovascular Disease

## 2015-04-28 DIAGNOSIS — Z7901 Long term (current) use of anticoagulants: Secondary | ICD-10-CM | POA: Diagnosis not present

## 2015-04-28 DIAGNOSIS — I10 Essential (primary) hypertension: Secondary | ICD-10-CM | POA: Insufficient documentation

## 2015-04-28 DIAGNOSIS — I252 Old myocardial infarction: Secondary | ICD-10-CM | POA: Insufficient documentation

## 2015-04-28 DIAGNOSIS — J449 Chronic obstructive pulmonary disease, unspecified: Secondary | ICD-10-CM | POA: Insufficient documentation

## 2015-04-28 DIAGNOSIS — E785 Hyperlipidemia, unspecified: Secondary | ICD-10-CM | POA: Insufficient documentation

## 2015-04-28 DIAGNOSIS — C3491 Malignant neoplasm of unspecified part of right bronchus or lung: Secondary | ICD-10-CM | POA: Diagnosis not present

## 2015-04-28 DIAGNOSIS — I259 Chronic ischemic heart disease, unspecified: Secondary | ICD-10-CM | POA: Insufficient documentation

## 2015-04-28 DIAGNOSIS — I255 Ischemic cardiomyopathy: Secondary | ICD-10-CM | POA: Diagnosis not present

## 2015-04-28 DIAGNOSIS — R079 Chest pain, unspecified: Secondary | ICD-10-CM | POA: Diagnosis present

## 2015-04-28 DIAGNOSIS — I480 Paroxysmal atrial fibrillation: Secondary | ICD-10-CM | POA: Insufficient documentation

## 2015-04-28 DIAGNOSIS — Z7982 Long term (current) use of aspirin: Secondary | ICD-10-CM | POA: Diagnosis not present

## 2015-04-28 DIAGNOSIS — Z950 Presence of cardiac pacemaker: Secondary | ICD-10-CM | POA: Diagnosis not present

## 2015-04-28 DIAGNOSIS — I495 Sick sinus syndrome: Secondary | ICD-10-CM | POA: Diagnosis not present

## 2015-04-28 DIAGNOSIS — I484 Atypical atrial flutter: Secondary | ICD-10-CM | POA: Insufficient documentation

## 2015-04-28 DIAGNOSIS — I340 Nonrheumatic mitral (valve) insufficiency: Secondary | ICD-10-CM | POA: Insufficient documentation

## 2015-04-28 DIAGNOSIS — R9439 Abnormal result of other cardiovascular function study: Secondary | ICD-10-CM | POA: Diagnosis not present

## 2015-04-28 DIAGNOSIS — Z951 Presence of aortocoronary bypass graft: Secondary | ICD-10-CM | POA: Insufficient documentation

## 2015-04-28 DIAGNOSIS — R931 Abnormal findings on diagnostic imaging of heart and coronary circulation: Secondary | ICD-10-CM | POA: Diagnosis present

## 2015-04-28 DIAGNOSIS — I2510 Atherosclerotic heart disease of native coronary artery without angina pectoris: Secondary | ICD-10-CM | POA: Diagnosis not present

## 2015-04-28 DIAGNOSIS — I5022 Chronic systolic (congestive) heart failure: Secondary | ICD-10-CM | POA: Diagnosis not present

## 2015-04-28 DIAGNOSIS — Z952 Presence of prosthetic heart valve: Secondary | ICD-10-CM | POA: Insufficient documentation

## 2015-04-28 DIAGNOSIS — Z955 Presence of coronary angioplasty implant and graft: Secondary | ICD-10-CM | POA: Diagnosis not present

## 2015-04-28 HISTORY — PX: CARDIAC CATHETERIZATION: SHX172

## 2015-04-28 SURGERY — LEFT HEART CATH AND CORONARY ANGIOGRAPHY
Anesthesia: Moderate Sedation

## 2015-04-28 MED ORDER — SODIUM CHLORIDE 0.9 % IJ SOLN: 3.0000 mL | Freq: Two times a day (BID) | INTRAMUSCULAR | Status: DC

## 2015-04-28 MED ORDER — SODIUM CHLORIDE 0.9 % WEIGHT BASED INFUSION: 1.0000 mL/kg/h | INTRAVENOUS | Status: DC

## 2015-04-28 MED ORDER — SODIUM CHLORIDE 0.9 % IV SOLN: 250.0000 mL | INTRAVENOUS | Status: DC | PRN

## 2015-04-28 MED ORDER — SODIUM CHLORIDE 0.9 % IV SOLN
INTRAVENOUS | Status: DC
Start: 2015-04-28 — End: 2015-04-28
  Administered 2015-04-28: 13:00:00 via INTRAVENOUS

## 2015-04-28 MED ORDER — MIDAZOLAM HCL 2 MG/2ML IJ SOLN
INTRAMUSCULAR | Status: DC | PRN
  Administered 2015-04-28: 1 mg via INTRAVENOUS

## 2015-04-28 MED ORDER — HEPARIN SODIUM (PORCINE) 1000 UNIT/ML IJ SOLN
INTRAMUSCULAR | Status: DC | PRN
  Administered 2015-04-28: 4000 [IU] via INTRAVENOUS

## 2015-04-28 MED ORDER — IOHEXOL 300 MG/ML  SOLN
INTRAMUSCULAR | Status: DC | PRN
  Administered 2015-04-28: 10 mL via INTRA_ARTERIAL
  Administered 2015-04-28: 75 mL via INTRA_ARTERIAL

## 2015-04-28 MED ORDER — SODIUM CHLORIDE 0.9 % IJ SOLN: 3.0000 mL | INTRAMUSCULAR | Status: DC | PRN

## 2015-04-28 MED ORDER — FENTANYL CITRATE (PF) 100 MCG/2ML IJ SOLN
INTRAMUSCULAR | Status: DC | PRN
  Administered 2015-04-28: 50 ug via INTRAVENOUS

## 2015-04-28 MED ORDER — VERAPAMIL HCL 2.5 MG/ML IV SOLN
INTRAVENOUS | Status: DC | PRN
  Administered 2015-04-28: 2.5 mg via INTRA_ARTERIAL

## 2015-04-28 MED ORDER — VERAPAMIL HCL 2.5 MG/ML IV SOLN
INTRAVENOUS | Status: AC
  Filled 2015-04-28: qty 2

## 2015-04-28 MED ORDER — HEPARIN (PORCINE) IN NACL 2-0.9 UNIT/ML-% IJ SOLN
INTRAMUSCULAR | Status: AC
  Filled 2015-04-28: qty 1000

## 2015-04-28 MED ORDER — FENTANYL CITRATE (PF) 100 MCG/2ML IJ SOLN
INTRAMUSCULAR | Status: AC
  Filled 2015-04-28: qty 2

## 2015-04-28 MED ORDER — MIDAZOLAM HCL 2 MG/2ML IJ SOLN
INTRAMUSCULAR | Status: AC
  Filled 2015-04-28: qty 2

## 2015-04-28 MED ORDER — HEPARIN SOD (PORK) LOCK FLUSH 100 UNIT/ML IV SOLN
INTRAVENOUS | Status: AC
  Filled 2015-04-28: qty 5

## 2015-04-28 MED ORDER — HEPARIN SODIUM (PORCINE) 1000 UNIT/ML IJ SOLN
INTRAMUSCULAR | Status: AC
  Filled 2015-04-28: qty 1

## 2015-04-28 SURGICAL SUPPLY — 10 items
CATH 5F 110X4 TIG (CATHETERS) ×3 IMPLANT
CATH INFINITI 5 FR MPA2 (CATHETERS) ×3 IMPLANT
CATH INFINITI 5FR ANG PIGTAIL (CATHETERS) ×3 IMPLANT
CATH INFINITI JR4 5F (CATHETERS) ×3 IMPLANT
DEVICE RAD TR BAND REGULAR (VASCULAR PRODUCTS) ×3 IMPLANT
GLIDESHEATH SLEND SS 6F .021 (SHEATH) ×3 IMPLANT
KIT MANI 3VAL PERCEP (MISCELLANEOUS) ×3 IMPLANT
PACK CARDIAC CATH (CUSTOM PROCEDURE TRAY) ×3 IMPLANT
WIRE HITORQ VERSACORE ST 145CM (WIRE) ×3 IMPLANT
WIRE SAFE-T 1.5MM-J .035X260CM (WIRE) ×3 IMPLANT

## 2015-04-28 NOTE — OR Nursing (Signed)
aRRIVED FROM CATH LAB, SAT IS 85-90, PLACED IN o2 AT 2 LITERS WITH IMPROVEMENT  Of sats 90-95%. Seen by Dr. Fletcher Anon.

## 2015-04-28 NOTE — H&P (View-Only) (Signed)
Cardiology Hospital Follow Up Note:   Date of Encounter: 04/15/2015  ID: Jenny Cooper, DOB 07-01-1936, MRN [id number]  PCP: Gayland Curry, MD Primary Cardiologist: Dr. Rockey Situ, MD  Chief Complaint  Patient presents with  . other    Follow up from [facility]; pneumonia & A-fib. Pt. c/o shortness of breath at times.  Meds reviewed by the patient verbally.      HPI:  79 year old female with history of CAD s/p remote history of MI in 2002 s/p 2 vessel CABG post stenting in 2002, history of mitral valve repair in 2002 secondary to mitral regurgitation, history of PAF s/p prior TEE/DCCV on Eliquis, atypical atrial flutter s/p ablation on 07/27/2013 s/p MDT PPM, carcinoma of right lung, COPD, HTN, and HLD who presented to [facility] on 8/2 with increased SOB and was found to have PNA, possibly post obstructive and elevated troponin.   She has known CAD as above with history of MI x 2 in January of 2002. She underwent PCI x 2 at this time. Per her report she did not tolerate this well which led to her 2 vessel CABG at that time. She underwent stress echo in 2005 that showed evidence of posterior and inferior infarct and no evidence of ischemia. She last underwent ischemic evaluation in 11/2006 via dipyridamole stress test that showed multiple areas of infarct without ischemia. EF 49%. She has undergone multiple echoes over the years that have shown stable EF. Echo in 2012 showed an EF 50%, inferior HK, posteriro AK, mild MR with prosthetic mitral valve ring with peak gradient of 10 mm Hg. Echo in 2013 showed an EF of 50% with mild MR/TR. She has known PAF, previously on warfarin, but has been changed to Eliquis by per primary treating team. She has undergone TEE/DCCV in 2013 for her PAF. In 2014 she was diagnosed with atypical atrial flutter and underwent ablation. She most recently underwent PPM generator change in 2013.   She was diagnosed with stage IIIa squamous cell lung cancer of the right lung  hilum in January 2016. At her last follow up with Dr. Oliva Bustard on 03/05/2015 she had finished concurrent chemoradiation with carboplatinum and Taxol and PET scan showed significant response. She was to proceed with 2nd cycle of therapy, be evaluated by suvivorship clinic and have a follow up PET scan in the next few months.   She had seen her PCP for possible bronchitis and was treated with a Zpack. Unfortunately, her symptoms persisted and ultimately got worse causing her to present to [facility] on 8/2. She complained of increase SOB, cough that was productive of green to yellow sputum, nausea, and vomiting. No chest pain, diaphoresis, presyncope, or syncope. No paresthesias. No edema. Symptoms are similar to her prior MI in 2002, except in 2002 she had bilateral upper extremity paraesthesias.   Upon her arrival she was found to be hypoxic with O2 sats in the low 90s, ultimately requiring BiPAP. CXR showed bibasilar airspace disease right greater than left has progressed significantly from the prior study, worrisome for recurrent carcinoma however pneumonia could also have this appearance. CT chest has been ordered. Troponin was found to be 0.48-->1.83. WBC is quite elevated for her at 10.1 (baseline around 2.9). Echo showed EF 30-35%, severe anterior and infero/posterior wall HK. Left ventricular function parameters were normal, mild to moderate MR. Left atrium was mildly dilated. RV systolic function was normal. Mild to moderate TR. PASP was moderately to severely elevated at 60 mm Hg. She was  started on heparin gtt, vancomycin, and Zosyn. She responded well to treatment and was discharged on Levaquin. She was restarted on her Eliquis 5 mg bid for her PAF along with aspirin 81 mg for presumed possible ACS with planned outpatient cardiac cath.  She is feeling better today since her discharge. She does continue to have a cough when laying supine that is productive of green sputum, though this is improving. Weight is  stable. No orthopnea, lower extremity edema, or early satiety. Energy is coming back. No chest pain, nausea, vomiting, palpitations, presyncope, or syncope. She is tolerating all of her medications as directed.     Past Medical History  Diagnosis Date  . HTN (hypertension)   . Pacemaker     a. MDT 2002; b. generator replacement 2013; c. followed by Dr. Omelia Blackwater, MD  . HLD (hyperlipidemia)   . CAD (coronary artery disease)     a. s/p MI x 2 in 2002 s/p PCI x 2 in 2002; b. s/p 2v CABG 2002; c. stress echo 07/2004 w/ evidence of pos & inf infarct & no evidence of ischemia; d. 4/08 dipyridamold scan w/ multiple areas of infarct, no ischemia, EF 49%     . Lung cancer   . History of colonoscopy 2013  . History of mammography, screening 2015  . History of Papanicolaou smear of cervix 2013  . Carcinoma of right lung 01/03/2015    a. followed by Dr. Oliva Bustard  . Mitral regurgitation     a. s/p mitral ring placement 09/2000; b. echo 09/2010: EF 50%, inf HK, post AK, mild MR, prosthetic mitral valve ring w/ peak gradient of 10 mmHg; b. echo 2/13: EF 50%, mild MR/TR     . History of blood clots     12/2001  . Atypical atrial flutter     a. s/p ablation 07/27/2013  . PAF (paroxysmal atrial fibrillation)     a. on Eliquis   . COPD (chronic obstructive pulmonary disease)   . Chronic systolic CHF (congestive heart failure)     a. echo 03/2015: EF 30-35%, sev ant/inf/pos HK, in mild to mod MR  : Past Surgical History  Procedure Laterality Date  . Appendectomy    . Vaginal hysterectomy    . Cardiac bypass    . Pacemaker insertion N/A   : Family History  Problem Relation Age of Onset  . COPD Mother     sister, and brother  :  reports that she quit smoking about 14 years ago. Her smoking use included Cigarettes. She has a 84 pack-year smoking history. She does not have any smokeless tobacco history on file. She reports that she drinks about 6.0 oz of alcohol per week. She reports that she does not use  illicit drugs.:   Allergies:  Allergies  Allergen Reactions  . Lovenox [Enoxaparin Sodium] Itching  . Meperidine Other (See Comments)    Other Reaction: pt does not like how it makes her feel     Home Medications:  Current Outpatient Prescriptions  Medication Sig Dispense Refill  . apixaban (ELIQUIS) 5 MG TABS tablet Take 5 mg by mouth 2 (two) times daily.    Marland Kitchen aspirin 81 MG tablet Take 81 mg by mouth daily.    Marland Kitchen atorvastatin (LIPITOR) 20 MG tablet Take 20 mg by mouth daily.    Marland Kitchen azelastine (ASTELIN) 0.1 % nasal spray Place 2 sprays into both nostrils 2 (two) times daily. Use in each nostril as directed    . calcium carbonate (  OS-CAL) 600 MG TABS tablet Take 600 mg by mouth 2 (two) times daily with a meal.    . fexofenadine ([employer] ALLERGY RELIEF) 180 MG tablet Take 180 mg by mouth daily.    Marland Kitchen ipratropium-albuterol (DUONEB) 0.5-2.5 (3) MG/3ML SOLN Take 3 mLs by nebulization every 6 (six) hours as needed. 360 mL 2  . levothyroxine (SYNTHROID, LEVOTHROID) 75 MCG tablet Take 75 mcg by mouth daily before breakfast.    . metoprolol succinate (TOPROL-XL) 25 MG 24 hr tablet Take 1 tablet (25 mg total) by mouth 2 (two) times daily. 60 tablet 0  . omeprazole (PRILOSEC) 20 MG capsule Take 20 mg by mouth daily.    . sacubitril-valsartan (ENTRESTO) 24-26 MG Take 1 tablet by mouth 2 (two) times daily. 60 tablet 5   No current facility-administered medications for this visit.   Facility-Administered Medications Ordered in Other Visits  Medication Dose Route Frequency Provider Last Rate Last Dose  . sodium chloride 0.9 % injection 10 mL  10 mL Intravenous PRN Forest Gleason, MD   10 mL at 02/19/15 1000     Review of Systems:  Review of Systems  Constitutional: Positive for malaise/fatigue. Negative for fever, chills, weight loss and diaphoresis.  HENT: Negative for congestion.   Eyes: Negative for discharge and redness.  Respiratory: Positive for cough, sputum production and shortness of  breath. Negative for hemoptysis and wheezing.   Cardiovascular: Negative for chest pain, palpitations, orthopnea, claudication, leg swelling and PND.  Gastrointestinal: Negative for heartburn, nausea, vomiting, abdominal pain, blood in stool and melena.  Genitourinary: Negative for hematuria.  Musculoskeletal: Negative for myalgias and falls.  Skin: Negative for rash.  Neurological: Positive for weakness. Negative for dizziness, sensory change, speech change, focal weakness and headaches.  Endo/Heme/Allergies: Does not bruise/bleed easily.  Psychiatric/Behavioral: The patient is not nervous/anxious.   All other systems reviewed and are negative.    Physical Exam:  Blood pressure 110/54, pulse 69, height '5\' 7"'$  (1.702 m), weight 159 lb 8 oz (72.349 kg). BMI: Body mass index is 24.98 kg/(m^2). General: Pleasant, NAD. Psych: Normal affect. Responds to questions with normal affect.  Neuro: Alert and oriented X 3. Moves all extremities spontaneously. HEENT: Normocephalic, atraumatic. EOM intact bilaterally. Sclera anicteric.  Neck: Trachea midline. Supple without bruits or JVD. Lungs:  Respirations regular and unlabored, CTA bilaterally without wheezing, crackles, or rhonchi.  Heart: Irregular, normal s3, s4. No murmurs, rubs, or gallops.  Abdomen: Soft, non-tender, non-distended, BS + x 4.  Extremities: No clubbing, cyanosis or edema. DP/PT/Radials 2+ and equal bilaterally.   Accessory Clinical Findings:  EKG: NSR with abnormal P axis, 69 bpm, anterolateral TWI  Recent Labs: 04/03/2015: TSH 0.679 04/14/2015: ALT 34; BUN 18; Creatinine, Ser 0.96; Hemoglobin 10.9*; Magnesium 1.9; Platelets 204; Potassium 3.8; Sodium 138  No results found for: CHOL, TRIG, HDL, CHOLHDL, VLDL, LDLCALC, LDLDIRECT  Weights: Wt Readings from Last 3 Encounters:  04/15/15 159 lb 8 oz (72.349 kg)  04/14/15 160 lb 4 oz (72.689 kg)  04/03/15 168 lb 10.4 oz (76.5 kg)    Estimated Creatinine Clearance: 47.7  mL/min (by C-G formula based on Cr of 0.96).   Other studies Reviewed: Additional studies/ records that were reviewed today include: [facility] hospitalization.  Assessment & Plan:  1. CAD s/p CABG as above: -Currently without symptoms of angina -Advised patient she would need cardiac cath at this point to evaluate newly depressed EF coupled with new anterior wall motion abnormality and anterolateral EKG changes -She declines cardiac catheterization at  this time stating last time this was done it led to bypass surgery 2/2 "stent rejection" -Discussed the risks of postponing cardiac catheterization with the patient in detail  -Will pursue initial ischemic evaluation at this time with Lexiscan Myoview to evaluate for high risk ischemia along the anterior myocardium with follow up cardiac catheterization  -Continue Toprol XL 25 mg bid and Lipitor 20 mg -Continue aspirin 81 mg (this is dual therapy with Eliquis 5 mg bid for her Afib)  2. Chronic systolic CHF: -EF 78-93% as above -Euvolemic today -Start Entresto 24/26 mg bid with planned titration as BP allows -Stop losartan (not on ACEi at this time) -Continue Toprol XL 25 mg bid  3. PAF s/p remote TEE/DCCV and atypical atrial flutter s/p ablation 07/27/2013: -CHADSVASc at least 6 (CHF, HTN, age x 2, vascular disease, female), giving her an estimated annual stroke risk of 9.8% -Continue Eliquis 5 mg bid -Will need to hold Eliquis 48 hours prior to cardiac cath, when/if she decides to have this -Continue Toprol XL 25 mg bid -Schedule event monitor to evaluate for increased Afib burden given SOB, though this is likely 2/2 her chronic systolic CHF  4. Sinus node dysfunction: -Status post change-out MDT dual chamber PPM 04/24/2012, S/N: [id number] H -Original implant date 10/12/2000 -Get established with EP  5. History of acute respiratory failure with hypoxia and sepsis: -Resolved -Continue nebs prn -Mucinex per PCP  6. History of mitral valve  repair with ring placement in 2002: -Echo 03/2015 showed mild to moderate mitral regurgitation  -Recommend follow up echo in 6 months given her history of repair    7. HTN: -Controlled -Entresto and Toprol XL as above   8. HLD: -Lipitor 20 mg   9. Lung carcinoma: -Followed by oncology -Perhaps this is playing a role in her SOB as well    Dispo: -Follow up 1 month  Current medicines are reviewed at length with the patient today.  The patient did not have any concerns regarding medicines.   Christell Faith, PA-C [address] [place], [place] [address] [phone] [place] 04/15/2015, 7:27 PM

## 2015-04-28 NOTE — Interval H&P Note (Signed)
History and Physical Interval Note:  04/28/2015 12:46 PM  Jenny Cooper  has presented today for surgery, with the diagnosis of Chest pain  The various methods of treatment have been discussed with the patient and family. After consideration of risks, benefits and other options for treatment, the patient has consented to  Procedure(s): Left Heart Cath (N/A) as a surgical intervention .  The patient's history has been reviewed, patient examined, no change in status, stable for surgery.  I have reviewed the patient's chart and labs.  Questions were answered to the patient's satisfaction.     Kathlyn Sacramento

## 2015-04-28 NOTE — Discharge Instructions (Signed)
Resume Eliquis tomorrow am. Stop Losartan                                                                   Keep right wrist elevated on pillow above the heart for today.  Watch right wrist for evidence of bleeding or hematoma.. If bleeding or hematoma noted, hold pressure over the site for at least 15 minutes and notify the physician.  No blending or flexing of the wrist--no lifting for the remainder of the day or for 2 weeks after your procedure. Notify the physician for evidence of infection or if you get a temperature.

## 2015-04-29 ENCOUNTER — Telehealth: Payer: Self-pay

## 2015-04-29 ENCOUNTER — Encounter: Payer: Self-pay | Admitting: Cardiovascular Disease

## 2015-04-29 NOTE — Telephone Encounter (Signed)
S/w pt who states HCTZ is on her medication list however, she thinks someone told her not to take it but could not remember who it was. HCTZ ordered by Dr. Manuella Ghazi during 8/2 hospital admission. At 8/16 OV, meds reviewed by Christell Faith and instructed to continue current medications. Instructed pt to continue current medications. Pt verbalized understanding with no further questions.

## 2015-04-30 ENCOUNTER — Encounter: Payer: Self-pay | Admitting: Physician Assistant

## 2015-04-30 ENCOUNTER — Inpatient Hospital Stay: Payer: Medicare Other

## 2015-05-02 ENCOUNTER — Telehealth: Payer: Self-pay

## 2015-05-02 NOTE — Telephone Encounter (Signed)
Jenny Mu, PA-C  Georgiana Shore, RN           I received a message from this patient that HCTZ had appeared on her medication list. She wanted to know why and by who. I had not continued this medication at her last office visit. It was not on her list when I saw her. Please remove it from her medication list.   Thanks.      S/w pt who states Ryan responded to her MyChart

## 2015-05-08 ENCOUNTER — Encounter: Payer: Self-pay | Admitting: Oncology

## 2015-05-08 ENCOUNTER — Other Ambulatory Visit: Payer: Self-pay | Admitting: Oncology

## 2015-05-08 DIAGNOSIS — C3491 Malignant neoplasm of unspecified part of right bronchus or lung: Secondary | ICD-10-CM

## 2015-05-08 MED ORDER — METOPROLOL SUCCINATE ER 25 MG PO TB24: 25.0000 mg | ORAL_TABLET | Freq: Two times a day (BID) | ORAL | Status: DC

## 2015-05-08 NOTE — Telephone Encounter (Signed)
Refill for metoprolol sent to pharmacy

## 2015-05-09 ENCOUNTER — Other Ambulatory Visit: Payer: Self-pay

## 2015-05-09 ENCOUNTER — Telehealth: Payer: Self-pay | Admitting: Cardiovascular Disease

## 2015-05-09 DIAGNOSIS — C3491 Malignant neoplasm of unspecified part of right bronchus or lung: Secondary | ICD-10-CM

## 2015-05-09 MED ORDER — METOPROLOL SUCCINATE ER 50 MG PO TB24: 50.0000 mg | ORAL_TABLET | Freq: Two times a day (BID) | ORAL | Status: DC

## 2015-05-09 MED ORDER — METOPROLOL SUCCINATE ER 25 MG PO TB24: 25.0000 mg | ORAL_TABLET | Freq: Two times a day (BID) | ORAL | Status: DC

## 2015-05-09 NOTE — Telephone Encounter (Signed)
Returning call for results please call If calling 12-4 today call  [phone]

## 2015-05-09 NOTE — Telephone Encounter (Signed)
S/w patient See result note

## 2015-05-09 NOTE — Addendum Note (Signed)
Addended by: Telford Nab on: 05/09/2015 08:04 AM   Modules accepted: Orders

## 2015-05-19 ENCOUNTER — Encounter: Payer: Self-pay | Admitting: Physician Assistant

## 2015-05-19 ENCOUNTER — Ambulatory Visit (INDEPENDENT_AMBULATORY_CARE_PROVIDER_SITE_OTHER): Payer: Medicare Other | Admitting: Physician Assistant

## 2015-05-19 VITALS — BP 112/68 | HR 64 | Ht 67.0 in | Wt 153.5 lb

## 2015-05-19 DIAGNOSIS — Z950 Presence of cardiac pacemaker: Secondary | ICD-10-CM

## 2015-05-19 DIAGNOSIS — I5022 Chronic systolic (congestive) heart failure: Secondary | ICD-10-CM | POA: Diagnosis not present

## 2015-05-19 DIAGNOSIS — I495 Sick sinus syndrome: Secondary | ICD-10-CM

## 2015-05-19 DIAGNOSIS — I340 Nonrheumatic mitral (valve) insufficiency: Secondary | ICD-10-CM

## 2015-05-19 DIAGNOSIS — I480 Paroxysmal atrial fibrillation: Secondary | ICD-10-CM

## 2015-05-19 DIAGNOSIS — I4892 Unspecified atrial flutter: Secondary | ICD-10-CM | POA: Diagnosis not present

## 2015-05-19 DIAGNOSIS — I2510 Atherosclerotic heart disease of native coronary artery without angina pectoris: Secondary | ICD-10-CM | POA: Diagnosis not present

## 2015-05-19 DIAGNOSIS — E785 Hyperlipidemia, unspecified: Secondary | ICD-10-CM

## 2015-05-19 DIAGNOSIS — I25810 Atherosclerosis of coronary artery bypass graft(s) without angina pectoris: Secondary | ICD-10-CM

## 2015-05-19 DIAGNOSIS — I10 Essential (primary) hypertension: Secondary | ICD-10-CM

## 2015-05-19 DIAGNOSIS — C801 Malignant (primary) neoplasm, unspecified: Secondary | ICD-10-CM

## 2015-05-19 MED ORDER — METOPROLOL SUCCINATE ER 25 MG PO TB24: 25.0000 mg | ORAL_TABLET | Freq: Every day | ORAL | Status: DC

## 2015-05-19 MED ORDER — IPRATROPIUM BROMIDE 0.03 % NA SOLN: 2.0000 | Freq: Two times a day (BID) | NASAL | Status: DC

## 2015-05-19 MED ORDER — CETIRIZINE HCL 10 MG PO CAPS: 1.0000 | ORAL_CAPSULE | ORAL | Status: DC

## 2015-05-19 NOTE — Progress Notes (Signed)
Cardiology Office Note:  Date of Encounter: 05/19/2015  ID: Jenny Cooper, DOB 03/06/1936, MRN [id number]  PCP:  Forest Gleason, MD Primary Cardiologist:  Dr. Rockey Situ, MD  Chief Complaint  Patient presents with  . other    1 month follow up. Meds reviewed by the patient verbally. Pt. cut back metoprolol to 25 mg twice a day due to decreased BP.     HPI:  79 year old female with history of CAD s/p remote history of MI in 2002 s/p 2 vessel CABG post stenting in 2002, history of mitral valve repair in 2002 secondary to mitral regurgitation, history of PAF s/p prior TEE/DCCV on Eliquis, atypical atrial flutter s/p ablation on 07/27/2013 s/p MDT PPM, carcinoma of right lung, COPD, HTN, and HLD who presented to [facility] on 8/2 with increased SOB and was found to have PNA, possibly post obstructive and elevated troponin.   She has known CAD as above with history of MI x 2 in January of 2002. She underwent PCI x 2 at this time. Per her report she did not tolerate this well which led to her 2 vessel CABG at that time. She underwent stress echo in 2005 that showed evidence of posterior and inferior infarct and no evidence of ischemia. She last underwent ischemic evaluation in 11/2006 via dipyridamole stress test that showed multiple areas of infarct without ischemia. EF 49%. She has undergone multiple echoes over the years that have shown stable EF. Echo in 2012 showed an EF 50%, inferior HK, posteriro AK, mild MR with prosthetic mitral valve ring with peak gradient of 10 mm Hg. Echo in 2013 showed an EF of 50% with mild MR/TR. She has known PAF, previously on warfarin, but has been changed to Eliquis by per primary treating team. She has undergone TEE/DCCV in 2013 for her PAF. In 2014 she was diagnosed with atypical atrial flutter and underwent ablation. She most recently underwent PPM generator change in 2013.   She was diagnosed with stage IIIa squamous cell lung cancer of the right lung hilum in  January 2016. At her last follow up with Dr. Oliva Bustard on 03/05/2015 she had finished concurrent chemoradiation with carboplatinum and Taxol and PET scan showed significant response. She was to proceed with 2nd cycle of therapy, be evaluated by suvivorship clinic and have a follow up PET scan in the next few months.   She had seen her PCP for possible bronchitis and was treated with a Zpack. Unfortunately, her symptoms persisted and ultimately got worse causing her to present to [facility] on 8/2. She complained of increase SOB, cough that was productive of green to yellow sputum, nausea, and vomiting. No chest pain, diaphoresis, presyncope, or syncope. No paresthesias. No edema. Symptoms are similar to her prior MI in 2002, except in 2002 she had bilateral upper extremity paraesthesias.   She required BiPAP for a short time upon her arrival. CXR showed bibasilar airspace disease right greater than left has progressed significantly from the prior study, worrisome for recurrent carcinoma however pneumonia could also have this appearance. CT chest has been ordered. Troponin was found to be 0.48-->1.83. WBC is quite elevated for her at 10.1 (baseline around 2.9). Echo showed EF 30-35%, severe anterior and infero/posterior wall HK. Left ventricular function parameters were normal, mild to moderate MR. Left atrium was mildly dilated. RV systolic function was normal. Mild to moderate TR. PASP was moderately to severely elevated at 60 mm Hg. She was started on heparin gtt, vancomycin, and Zosyn.  She responded well to treatment and was discharged on Levaquin. She was restarted on her Eliquis 5 mg bid for her PAF along with aspirin 81 mg for presumed possible ACS with planned outpatient cardiac cath.   In hospital follow up on 8/16 she was feeling better, though still had a slight cough. She did not want to move forward with cardiac cath at that time given her reported prior history with caardiac caths. She did agree to move  forward with a Lexiscan Myoview that showed >>>. Given the abnormal stress test she underwent cardiac cath on 04/28/2015 that showed 3 vessel CAD with patent SVG to LAD, occluded SVG to OM, native RCA had severe ISR in the mid-segment and was occluded distally at the sites of previously placed stents. There were left to right collaterals. Moderately reduced LVSF with EF of 35-40%. Mildly elevated LVEDP. There were no good targets for revascularization. It was recommended she proceed with medical therapy. She wore a cardiac event monitor to evaluate her Afib burden that showed 50% Afib burden with a peak heart rate of 116, mostly rate controlled. Given this finding her Toprol was titrated up on 9/6 to 50 mg bid.   She continues to feel good at this time. She notes some post nasal drip and an occasional cough that has been present for >2 years now. She will be seeing her oncologist in 1.5 weeks to discuss this and for repeat cancer imaging. No chest pain, SOB, palpitations, diaphoresis, nausea, vomiting, presyncope, or syncope. She was unable to to tolerate the titration of Toprol XL 50 mg bid secondary to soft BP in the 31'S systolic, thus she went back down to 25 mg bid. She continues to take all of her medications daily without issues.      Past Medical History  Diagnosis Date  . HTN (hypertension)   . Pacemaker     a. MDT 2002; b. generator replacement 2013; c. followed by Dr. Omelia Blackwater, MD  . HLD (hyperlipidemia)   . CAD (coronary artery disease)     a. s/p MI x 2 in 2002 s/p PCI x 2 in 2002; b. s/p 2v CABG 2002; c. stress echo 07/2004 w/ evi of pos & inf infarct & no evi of ischemia; d. 4/08 dipyridamole scan w/ multiple areas of infarct, no ischemia, EF 49%; e. cath 04/28/15 3v CAD, med Rx rec, no targets for revasc, LM lum irregs, pLAD 30%, 100%, ost-pLCx 60%, [name] 99%, OM2 100%, p-mRCA 90%, [name] 100% L-R collats, VG-mLAD irregs, VG-OM2 oc  . Lung cancer   . History of colonoscopy 2013  . History  of mammography, screening 2015  . History of Papanicolaou smear of cervix 2013  . Carcinoma of right lung 01/03/2015    a. followed by Dr. Oliva Bustard  . Mitral regurgitation     a. s/p mitral ring placement 09/2000; b. echo 09/2010: EF 50%, inf HK, post AK, mild MR, prosthetic mitral valve ring w/ peak gradient of 10 mmHg; b. echo 2/13: EF 50%, mild MR/TR     . History of blood clots     12/2001  . Atypical atrial flutter     a. s/p ablation 07/27/2013  . PAF (paroxysmal atrial fibrillation)     a. on Eliquis   . COPD (chronic obstructive pulmonary disease)   . Chronic systolic CHF (congestive heart failure)     a. echo 03/2015: EF 30-35%, sev ant/inf/pos HK, in mild to mod MR  :  Past Surgical History  Procedure Laterality Date  . Appendectomy    . Vaginal hysterectomy    . Cardiac bypass    . Pacemaker insertion N/A   . Cardiac catheterization N/A 04/28/2015    Procedure: Left Heart Cath and Coronary Angiography;  Surgeon: Wellington Hampshire, MD;  Location: [facility];  Service: Cardiovascular;  Laterality: N/A;  :  Social History:  The patient  reports that she quit smoking about 14 years ago. Her smoking use included Cigarettes. She has a 84 pack-year smoking history. She does not have any smokeless tobacco history on file. She reports that she drinks about 6.0 oz of alcohol per week. She reports that she does not use illicit drugs.   Family History  Problem Relation Age of Onset  . COPD Mother     sister, and brother     Allergies:  Allergies  Allergen Reactions  . Lovenox [Enoxaparin Sodium] Itching  . Meperidine Other (See Comments)    Other Reaction: pt does not like how it makes her feel     Home Medications:  Current Outpatient Prescriptions  Medication Sig Dispense Refill  . apixaban (ELIQUIS) 5 MG TABS tablet Take 5 mg by mouth 2 (two) times daily.    Marland Kitchen atorvastatin (LIPITOR) 20 MG tablet Take 20 mg by mouth daily.    Marland Kitchen azelastine (ASTELIN) 0.1 % nasal  spray Place 2 sprays into both nostrils 2 (two) times daily. Use in each nostril as directed    . calcium carbonate (OS-CAL) 600 MG TABS tablet Take 600 mg by mouth 2 (two) times daily with a meal.    . fexofenadine ([employer] ALLERGY RELIEF) 180 MG tablet Take 180 mg by mouth daily.    Marland Kitchen ipratropium-albuterol (DUONEB) 0.5-2.5 (3) MG/3ML SOLN Take 3 mLs by nebulization every 6 (six) hours as needed. 360 mL 2  . levothyroxine (SYNTHROID, LEVOTHROID) 75 MCG tablet Take 75 mcg by mouth daily before breakfast.    . metoprolol succinate (TOPROL-XL) 25 MG 24 hr tablet Take 25 mg by mouth 2 (two) times daily.    Marland Kitchen omeprazole (PRILOSEC) 20 MG capsule Take 20 mg by mouth daily.    . sacubitril-valsartan (ENTRESTO) 24-26 MG Take 1 tablet by mouth 2 (two) times daily. 60 tablet 5   No current facility-administered medications for this visit.   Facility-Administered Medications Ordered in Other Visits  Medication Dose Route Frequency Provider Last Rate Last Dose  . sodium chloride 0.9 % injection 10 mL  10 mL Intravenous PRN Forest Gleason, MD   10 mL at 02/19/15 1000     Review of Systems:  Review of Systems  Constitutional: Negative for fever, chills, weight loss, malaise/fatigue and diaphoresis.  HENT: Negative for congestion.        Post nasal drip  Eyes: Negative for discharge and redness.  Respiratory: Positive for shortness of breath. Negative for cough, hemoptysis, sputum production and wheezing.        Occasional white sputum   Cardiovascular: Negative for chest pain, palpitations, orthopnea, claudication, leg swelling and PND.  Gastrointestinal: Negative for nausea, vomiting, blood in stool and melena.  Musculoskeletal: Negative for Denetria Luevanos.  Skin: Negative for rash.  Neurological: Negative for sensory change, speech change, focal weakness, weakness and headaches.  Endo/Heme/Allergies: Does not bruise/bleed easily.  Psychiatric/Behavioral: The patient is not nervous/anxious.   All other systems  reviewed and are negative.    Physical Exam:  Blood pressure 112/68, pulse 64, height '5\' 7"'$  (1.702 m), weight 153 lb 8 oz (  69.627 kg). BMI: Body mass index is 24.04 kg/(m^2). General: Pleasant, NAD. Psych: Normal affect. Responds to questions with normal affect.  Neuro: Alert and oriented X 3. Moves all extremities spontaneously. HEENT: Normocephalic, atraumatic. EOM intact. Sclera anicteric.  Neck: Trachea midline. Supple without bruits or JVD. Lungs:  Respirations regular and unlabored. CTA bilaterally without wheezing, crackles, or rhonchi.  Heart: RRR, normal s3, s4. No murmurs, rubs, or gallops.  Abdomen: Soft, non-tender, non-distended, BS + x 4.  Extremities: No clubbing, cyanosis or edema. DP/PT/Radials 2+ and equal bilaterally.   Accessory Clinical Findings:  EKG: Paced, 64 bpm  Recent Labs: 04/03/2015: TSH 0.679 04/14/2015: ALT 34; Hemoglobin 10.9*; Magnesium 1.9; Platelets 204 04/23/2015: BUN 11; Creatinine, Ser 0.79; Potassium 4.2; Sodium 144  No results found for requested labs within last 365 days.  CrCl cannot be calculated (Patient has no serum creatinine result on file.).  Weights: Wt Readings from Last 3 Encounters:  05/19/15 153 lb 8 oz (69.627 kg)  04/28/15 159 lb (72.122 kg)  04/15/15 159 lb 8 oz (72.349 kg)    Other studies Reviewed: Additional studies/ records that were reviewed today include: prior office notes.  Assessment & Plan:  1. CAD s/p CABG s/p PCI: -No symptoms concerning for angina -Recent cardiac cath 04/28/2015 with 3 vessel disease, to be managed medically given no targets for revascularization. This was discussed in detail with the patient -Continue Eliquis 5 mg in place of aspirin  -Continue Toprol XL and Lipitor  2. PAF s/p remote TEE/DCCV and atypical atrial flutter s/p ablation 07/27/2013: -Recent event monitor showed 50% Afib burden. She would likely do better with decreased Afib burden given her cardiomyopathy. Unfortunately, her  soft BP has precluded full titration of Toprol XL to 50 mg bid as she had to go back down to 25 mg bid secondary to soft BP -Will have patient take Toprol XL 25 mg q AM and 50 mg q PM in an effort to decrease her Afib burden along with increasing her beta blocker load  -Her increased Afib burden certainly could be playing a role in her chronic systolic CHF, along with her ischemic heart disease  -Alternative option could be to use amiodarone if unable to tolerate increased dose of beta blocker  -Continue Eliquis 5 mg bid -CHADSVASc at least 6 (CHF, HTN, age x 2, vascular disease, female), giving her an estimated annual stroke risk of 9.8%  3. Ischemic cardiomyopathy/chronic systolic CHF: -EF 10-93% by echo in early August and 35-40% by LV gram in late August -She appears euvolemic today -Continue Entresto 24/26 bid, unable to titrate at this time given the soft BP and need for decreased Afib burden and titration of beta blocker as above  4. Sinus node dysfunction: -Status post change-out MDT dual chamber PPM 04/24/2012, S/N: [id number] H -Original implant date 10/12/2000 -Get established with EP  5. History of mitral valve repair with ring placement in 2002: -Functioning on echo  6. History of acute respiratory failure with hypoxia and sepsis: -Resolved  7. HTN: -Controlled -Continue medications as above  8. HLD: -Continue Lipitor as above   9. Lung carcinoma: -Has follow up appointment with oncology in 1.5 weeks   Dispo: -Follow up 3 months  Current medicines are reviewed at length with the patient today.  The patient did not have any concerns regarding medicines.   Christell Faith, PA-C [facility] [address], [place] [id number] [phone] [facility] 05/19/2015, 1:44 PM

## 2015-05-19 NOTE — Patient Instructions (Addendum)
Medication Instructions:  Your physician has recommended you make the following change in your medication:  STOP taking allegra STOP taking asteline START taking ipratropium/atrovent START taking zyrtec '10mg'$  once per day START taking metoprolol as follows: '25mg'$  in the morning and '50mg'$  in the evening.     Labwork: none  Testing/Procedures: none  Follow-Up: Your physician recommends that you schedule a follow-up appointment in: 3 months with Christell Faith, PA-C   Any Other Special Instructions Will Be Listed Below (If Applicable). Monitor your BP and call us Friday with readings.

## 2015-05-21 ENCOUNTER — Encounter: Payer: Self-pay | Admitting: Physician Assistant

## 2015-05-22 ENCOUNTER — Encounter: Payer: Self-pay | Admitting: Oncology

## 2015-05-23 ENCOUNTER — Telehealth: Payer: Self-pay

## 2015-05-23 ENCOUNTER — Encounter: Payer: Self-pay | Admitting: Physician Assistant

## 2015-05-23 NOTE — Telephone Encounter (Signed)
Forward BP readings to [facility]  Non-Urgent Medical Question  Message [phone]   From  Miyo Aina   To  Rise Mu, [place]   Sent  05/23/2015 11:18 AM     Blood Pressure Readings:  9/19  6:40PM  97/41  PULSE  82  9/20  9:23AM  85/45        75      5:30PM  102/58       77  9/21  9:20AM  106/61       78       5:15PM  114/65       89  9/22  9:25AM  114/63       87      6:15PM  117/50       82  9/23  9:10AM  119/56       89   WooHoo!!  Terri Skains

## 2015-05-23 NOTE — Telephone Encounter (Signed)
BP and pulse look good, especially towards the last several readings. If this is on the dosing of Toprol XL 25 mg q AM and 50 mg q PM continue that as she is tolerating it well.

## 2015-05-23 NOTE — Telephone Encounter (Signed)
Responded to pt via MyChart

## 2015-05-26 ENCOUNTER — Inpatient Hospital Stay: Payer: Medicare Other

## 2015-05-26 ENCOUNTER — Ambulatory Visit
Admission: RE | Admit: 2015-05-26 | Discharge: 2015-05-26 | Disposition: A | Discharge status: Home / Self Care | Payer: Medicare Other | Source: Ambulatory Visit | Attending: Oncology | Admitting: Oncology

## 2015-05-26 ENCOUNTER — Inpatient Hospital Stay: Payer: Medicare Other | Attending: Oncology

## 2015-05-26 DIAGNOSIS — R531 Weakness: Secondary | ICD-10-CM | POA: Insufficient documentation

## 2015-05-26 DIAGNOSIS — Z7901 Long term (current) use of anticoagulants: Secondary | ICD-10-CM | POA: Diagnosis not present

## 2015-05-26 DIAGNOSIS — Z923 Personal history of irradiation: Secondary | ICD-10-CM | POA: Diagnosis not present

## 2015-05-26 DIAGNOSIS — Z08 Encounter for follow-up examination after completed treatment for malignant neoplasm: Secondary | ICD-10-CM | POA: Diagnosis not present

## 2015-05-26 DIAGNOSIS — R5383 Other fatigue: Secondary | ICD-10-CM | POA: Insufficient documentation

## 2015-05-26 DIAGNOSIS — Z9861 Coronary angioplasty status: Secondary | ICD-10-CM | POA: Diagnosis not present

## 2015-05-26 DIAGNOSIS — I700 Atherosclerosis of aorta: Secondary | ICD-10-CM | POA: Insufficient documentation

## 2015-05-26 DIAGNOSIS — Z9221 Personal history of antineoplastic chemotherapy: Secondary | ICD-10-CM | POA: Diagnosis not present

## 2015-05-26 DIAGNOSIS — Z950 Presence of cardiac pacemaker: Secondary | ICD-10-CM | POA: Insufficient documentation

## 2015-05-26 DIAGNOSIS — I480 Paroxysmal atrial fibrillation: Secondary | ICD-10-CM | POA: Diagnosis not present

## 2015-05-26 DIAGNOSIS — Z951 Presence of aortocoronary bypass graft: Secondary | ICD-10-CM | POA: Insufficient documentation

## 2015-05-26 DIAGNOSIS — Z79899 Other long term (current) drug therapy: Secondary | ICD-10-CM | POA: Diagnosis not present

## 2015-05-26 DIAGNOSIS — Z8701 Personal history of pneumonia (recurrent): Secondary | ICD-10-CM | POA: Diagnosis not present

## 2015-05-26 DIAGNOSIS — J90 Pleural effusion, not elsewhere classified: Secondary | ICD-10-CM | POA: Insufficient documentation

## 2015-05-26 DIAGNOSIS — I10 Essential (primary) hypertension: Secondary | ICD-10-CM | POA: Insufficient documentation

## 2015-05-26 DIAGNOSIS — J449 Chronic obstructive pulmonary disease, unspecified: Secondary | ICD-10-CM | POA: Insufficient documentation

## 2015-05-26 DIAGNOSIS — I252 Old myocardial infarction: Secondary | ICD-10-CM | POA: Insufficient documentation

## 2015-05-26 DIAGNOSIS — Z23 Encounter for immunization: Secondary | ICD-10-CM | POA: Insufficient documentation

## 2015-05-26 DIAGNOSIS — C3491 Malignant neoplasm of unspecified part of right bronchus or lung: Secondary | ICD-10-CM

## 2015-05-26 DIAGNOSIS — Z85118 Personal history of other malignant neoplasm of bronchus and lung: Secondary | ICD-10-CM | POA: Insufficient documentation

## 2015-05-26 DIAGNOSIS — E785 Hyperlipidemia, unspecified: Secondary | ICD-10-CM | POA: Diagnosis not present

## 2015-05-26 DIAGNOSIS — Z87891 Personal history of nicotine dependence: Secondary | ICD-10-CM | POA: Diagnosis not present

## 2015-05-26 LAB — CBC WITH DIFFERENTIAL/PLATELET
Basophils Absolute: 0 10*3/uL (ref 0–0.1)
Basophils Relative: 1 %
EOS ABS: 0.7 10*3/uL (ref 0–0.7)
EOS PCT: 16 %
HCT: 37 % (ref 35.0–47.0)
Hemoglobin: 12.2 g/dL (ref 12.0–16.0)
LYMPHS ABS: 0.7 10*3/uL — AB (ref 1.0–3.6)
Lymphocytes Relative: 18 %
MCH: 28.8 pg (ref 26.0–34.0)
MCHC: 33 g/dL (ref 32.0–36.0)
MCV: 87.5 fL (ref 80.0–100.0)
MONOS PCT: 12 %
Monocytes Absolute: 0.5 10*3/uL (ref 0.2–0.9)
Neutro Abs: 2.3 10*3/uL (ref 1.4–6.5)
Neutrophils Relative %: 53 %
PLATELETS: 164 10*3/uL (ref 150–440)
RBC: 4.23 MIL/uL (ref 3.80–5.20)
RDW: 15.8 % — ABNORMAL HIGH (ref 11.5–14.5)
WBC: 4.2 10*3/uL (ref 3.6–11.0)

## 2015-05-26 LAB — COMPREHENSIVE METABOLIC PANEL
ALK PHOS: 64 U/L (ref 38–126)
ALT: 8 U/L — ABNORMAL LOW (ref 14–54)
ANION GAP: 5 (ref 5–15)
AST: 19 U/L (ref 15–41)
Albumin: 3.7 g/dL (ref 3.5–5.0)
BUN: 18 mg/dL (ref 6–20)
CALCIUM: 8.7 mg/dL — AB (ref 8.9–10.3)
CO2: 28 mmol/L (ref 22–32)
Chloride: 104 mmol/L (ref 101–111)
Creatinine, Ser: 0.9 mg/dL (ref 0.44–1.00)
Glucose, Bld: 112 mg/dL — ABNORMAL HIGH (ref 65–99)
Potassium: 3.7 mmol/L (ref 3.5–5.1)
SODIUM: 137 mmol/L (ref 135–145)
Total Bilirubin: 0.6 mg/dL (ref 0.3–1.2)
Total Protein: 7.4 g/dL (ref 6.5–8.1)

## 2015-05-26 MED ORDER — HEPARIN SOD (PORK) LOCK FLUSH 100 UNIT/ML IV SOLN
500.0000 [IU] | Freq: Once | INTRAVENOUS | Status: AC
Start: 2015-05-26 — End: 2015-05-26
  Administered 2015-05-26: 500 [IU] via INTRAVENOUS

## 2015-05-26 MED ORDER — SODIUM CHLORIDE 0.9 % IJ SOLN
10.0000 mL | Freq: Once | INTRAMUSCULAR | Status: AC
  Administered 2015-05-26: 10 mL via INTRAVENOUS
  Filled 2015-05-26: qty 10

## 2015-05-26 MED ORDER — HEPARIN SOD (PORK) LOCK FLUSH 100 UNIT/ML IV SOLN: 500.0000 [IU] | Freq: Once | INTRAVENOUS | Status: DC

## 2015-05-26 MED ORDER — HEPARIN SOD (PORK) LOCK FLUSH 100 UNIT/ML IV SOLN
INTRAVENOUS | Status: AC
  Filled 2015-05-26: qty 5

## 2015-05-26 MED ORDER — IOHEXOL 300 MG/ML  SOLN
75.0000 mL | Freq: Once | INTRAMUSCULAR | Status: AC | PRN
Start: 2015-05-26 — End: 2015-05-26
  Administered 2015-05-26: 75 mL via INTRAVENOUS

## 2015-05-26 MED ORDER — IOHEXOL 300 MG/ML  SOLN: 75.0000 mL | Freq: Once | INTRAMUSCULAR | Status: DC | PRN

## 2015-05-26 NOTE — Addendum Note (Signed)
Addended by: Oretha Ellis on: 05/26/2015 10:50 AM   Modules accepted: Orders

## 2015-05-28 ENCOUNTER — Encounter: Payer: Self-pay | Admitting: Physician Assistant

## 2015-05-28 ENCOUNTER — Inpatient Hospital Stay: Payer: Medicare Other

## 2015-05-28 ENCOUNTER — Encounter: Payer: Self-pay | Admitting: Oncology

## 2015-05-28 ENCOUNTER — Inpatient Hospital Stay (HOSPITAL_BASED_OUTPATIENT_CLINIC_OR_DEPARTMENT_OTHER): Payer: Medicare Other | Admitting: Oncology

## 2015-05-28 VITALS — BP 127/80 | HR 85 | Temp 95.4°F | Wt 154.5 lb

## 2015-05-28 DIAGNOSIS — Z8701 Personal history of pneumonia (recurrent): Secondary | ICD-10-CM

## 2015-05-28 DIAGNOSIS — E785 Hyperlipidemia, unspecified: Secondary | ICD-10-CM

## 2015-05-28 DIAGNOSIS — Z923 Personal history of irradiation: Secondary | ICD-10-CM

## 2015-05-28 DIAGNOSIS — C3491 Malignant neoplasm of unspecified part of right bronchus or lung: Secondary | ICD-10-CM

## 2015-05-28 DIAGNOSIS — C3490 Malignant neoplasm of unspecified part of unspecified bronchus or lung: Secondary | ICD-10-CM

## 2015-05-28 DIAGNOSIS — R5383 Other fatigue: Secondary | ICD-10-CM

## 2015-05-28 DIAGNOSIS — J449 Chronic obstructive pulmonary disease, unspecified: Secondary | ICD-10-CM

## 2015-05-28 DIAGNOSIS — Z9221 Personal history of antineoplastic chemotherapy: Secondary | ICD-10-CM

## 2015-05-28 DIAGNOSIS — I10 Essential (primary) hypertension: Secondary | ICD-10-CM

## 2015-05-28 DIAGNOSIS — R531 Weakness: Secondary | ICD-10-CM

## 2015-05-28 DIAGNOSIS — I700 Atherosclerosis of aorta: Secondary | ICD-10-CM

## 2015-05-28 DIAGNOSIS — Z950 Presence of cardiac pacemaker: Secondary | ICD-10-CM

## 2015-05-28 DIAGNOSIS — J90 Pleural effusion, not elsewhere classified: Secondary | ICD-10-CM

## 2015-05-28 MED ORDER — INFLUENZA VAC SPLIT QUAD 0.5 ML IM SUSY
0.5000 mL | PREFILLED_SYRINGE | Freq: Once | INTRAMUSCULAR | Status: AC
  Administered 2015-05-28: 0.5 mL via INTRAMUSCULAR

## 2015-05-28 NOTE — Progress Notes (Signed)
[place] @ [facility] Telephone:(336) [phone]  Fax:(336) [phone]     Jenny Cooper OB: June 29, 1936  MR#: [id number]  ZWC#:[id number]  Patient Care Team: Jenny Gleason, MD as PCP - General (Oncology)  CHIEF COMPLAINT:  Chief Complaint  Patient presents with  . 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   Patient had compression of the right mainstem bronchus because of enlarged lymph node and a mass. Stage is T4 N1 M0 tumor stage IIIa diagnosis in January of 2016 2.started on radiation and chemotherapy from October 21, 2014 3.finished 6 cycles of carboplatinum and Taxol  in March  29 th of 2016, As finished concurrent chemoradiation therapy and PET scan shows significant response 4. started on consolidation chemotherapy with carboplatinum and Taxol 2 cycle   Patient finished 2 cycles of consultation therapy on March 06, 2015   5.  Atrial fibrillation diagnosis in August of 2016 on  eloquis     Oncology Flowsheet 02/05/2015 02/26/2015 03/05/2015 04/01/2015 04/02/2015 04/03/2015 04/04/2015  Day, Cycle Day 1, Cycle 2 Day 1, Cycle 1 Day 1, Cycle 1 - - - -  CARBOplatin (PARAPLATIN) IV - 390 mg - - - - -  dexamethasone (DECADRON) IV [ 20 mg ] [ 12 mg ] [ 20 mg ] - - - -  fosaprepitant (EMEND) IV - [ 150 mg ] - - - - -  methylPREDNISolone sodium succinate 40 mg/mL (SOLU-MEDROL) IV - - - 40 mg 40 mg 40 mg -  ondansetron (ZOFRAN) IJ - - - - - - -  ondansetron (ZOFRAN) IV [ 16 mg ] - [ 16 mg ] - - - -  PACLitaxel (TAXOL) IV 80 mg/m2 144 mg 144 mg - - - -  palonosetron (ALOXI) IV - 0.25 mg - - - - -  predniSONE (DELTASONE) PO - - - - - - 20 mg    INTERVAL HISTORY: 79 year old lady was finished chemoradiation therapy came today further follow-up regarding carcinoma of lung.   September, 2016 Patient is here for further  follow-up and treatment consideration.  Had a repeat CT scan done r for follow-up regarding carcinoma of lung and pneumonia.  Patient is feeling better.  No cough no shortness of breath or hemoptysis or chest pain.  Appetite is improving.  Patient also needs to get flu vaccine done   REVIEW OF SYSTEMS:    general status: Patient is feeling weak and tired.. Gradual improvement in the strength or pure of last few days  No change in a performance status.  No chills.  No fever. HEENT: Alopecia.  No evidence of stomatitis Lungs: No cough or shortness of breath Cardiac: No chest pain or paroxysmal nocturnal dyspnea GI: No nausea no vomiting no diarrhea no abdominal pain Skin: No rash Lower extremity no swelling Neurological system: Grade 1 neuropathy  No other focal signs  Musculoskeletal system no bony pains  As per HPI. Otherwise, a complete review of systems is negatve.  PAST MEDICAL HISTORY: Past Medical History  Diagnosis Date  . HTN (hypertension)   . Pacemaker     a. MDT 2002; b. generator replacement 2013; c. followed by Dr. Omelia Blackwater, MD  . HLD (hyperlipidemia)   . CAD (coronary artery disease)     a. s/p MI x 2 in 2002 s/p PCI x 2  in 2002; b. s/p 2v CABG 2002; c. stress echo 07/2004 w/ evi of pos & inf infarct & no evi of ischemia; d. 4/08 dipyridamole scan w/ multiple areas of infarct, no ischemia, EF 49%; e. cath 04/28/15 3v CAD, med Rx rec, no targets for revasc, LM lum irregs, pLAD 30%, 100%, ost-pLCx 60%, [name] 99%, OM2 100%, p-mRCA 90%, [name] 100% L-R collats, VG-mLAD irregs, VG-OM2 oc  . Lung cancer   . History of colonoscopy 2013  . History of mammography, screening 2015  . History of Papanicolaou smear of cervix 2013  . Carcinoma of right lung 01/03/2015    a. followed by Dr. Oliva Cooper  . Mitral regurgitation     a. s/p mitral ring placement 09/2000; b. echo 09/2010: EF 50%, inf HK, post AK, mild MR, prosthetic mitral valve ring w/ peak gradient of 10 mmHg; b. echo 2/13: EF 50%,  mild MR/TR     . History of blood clots     12/2001  . Atypical atrial flutter     a. s/p ablation 07/27/2013  . PAF (paroxysmal atrial fibrillation)     a. on Eliquis   . COPD (chronic obstructive pulmonary disease)   . Chronic systolic CHF (congestive heart failure)     a. echo 03/2015: EF 30-35%, sev ant/inf/pos HK, in mild to mod MR    PAST SURGICAL HISTORY: Past Surgical History  Procedure Laterality Date  . Appendectomy    . Vaginal hysterectomy    . Cardiac bypass    . Pacemaker insertion N/A   . Cardiac catheterization N/A 04/28/2015    Procedure: Left Heart Cath and Coronary Angiography;  Surgeon: Jenny Hampshire, MD;  Location: [place] [facility];  Service: Cardiovascular;  Laterality: N/A;    FAMILY HISTORY Family History  Problem Relation Age of Onset  . COPD Mother     sister, and brother        ADVANCED DIRECTIVES:Patient does not have any advanced healthcare directive. Information has been given. HEALTH MAINTENANCE: Social History  Substance Use Topics  . Smoking status: Former Smoker -- 2.00 packs/day for 42 years    Types: Cigarettes    Quit date: 08/30/2000  . Smokeless tobacco: None     Comment: quit smoking in 08/28/2000  . Alcohol Use: 6.0 oz/week    10 Glasses of wine per week     Comment: 2 glasses of wine per day     Allergies  Allergen Reactions  . Lovenox [Enoxaparin Sodium] Itching  . Meperidine Other (See Comments)    Other Reaction: pt does not like how it makes her feel    Current Outpatient Prescriptions  Medication Sig Dispense Refill  . apixaban (ELIQUIS) 5 MG TABS tablet Take 5 mg by mouth 2 (two) times daily.    Marland Kitchen atorvastatin (LIPITOR) 20 MG tablet Take 20 mg by mouth daily.    . calcium carbonate (OS-CAL) 600 MG TABS tablet Take 600 mg by mouth 2 (two) times daily with a meal.    . Cetirizine HCl 10 MG CAPS Take 1 capsule (10 mg total) by mouth 1 day or 1 dose. 30 capsule 5  . ipratropium (ATROVENT) 0.03 % nasal spray  Place 2 sprays into both nostrils 2 (two) times daily. 30 mL 5  . levothyroxine (SYNTHROID, LEVOTHROID) 75 MCG tablet Take 75 mcg by mouth daily before breakfast.    . metoprolol succinate (TOPROL XL) 25 MG 24 hr tablet Take 1 tablet (25 mg total) by mouth daily. Take  18m every morning and 511mevery evening 90 tablet 5  . omeprazole (PRILOSEC) 20 MG capsule Take 20 mg by mouth daily.    . sacubitril-valsartan (ENTRESTO) 24-26 MG Take 1 tablet by mouth 2 (two) times daily. 60 tablet 5  . azelastine (ASTELIN) 0.1 % nasal spray PLACE 1 SPRAY INTO BOTH NOSTRILS 2 (TWO) TIMES DAILY AS NEEDED.  11  . ipratropium-albuterol (DUONEB) 0.5-2.5 (3) MG/3ML SOLN TAKE 3ML BY NEBULIZER EVERY 6 HOURS AS NEEDED  2   No current facility-administered medications for this visit.   Facility-Administered Medications Ordered in Other Visits  Medication Dose Route Frequency Provider Last Rate Last Dose  . sodium chloride 0.9 % injection 10 mL  10 mL Intravenous PRN JaForest GleasonMD   10 mL at 02/19/15 1000    OBJECTIVE:  Filed Vitals:   05/28/15 0903  BP: 127/80  Pulse: 85  Temp: 95.4 F (35.2 C)     Body mass index is 24.19 kg/(m^2).    ECOG FS:1 - Symptomatic but completely ambulatory  PHYSICAL EXAM: GENERAL:  Well developed, well nourished, sitting comfortably in the exam room in no acute distress. MENTAL STATUS:  Alert and oriented to person, place and time. HEAD: alopecia Normocephalic, atraumatic, face symmetric, no Cushingoid features. EYES: eyes.  Pupils equal round and reactive to light and accomodation.  No conjunctivitis or scleral icterus. ENT:  Oropharynx clear without lesion.  Tongue normal. Mucous membranes moist.  RESPIRATORY:  Clear to auscultation without rales, wheezes or rhonchi. CARDIOVASCULAR:  Regular rate and rhythm without murmur, rub or gallop. . ABDOMEN:  Soft, non-tender, with active bowel sounds, and no hepatosplenomegaly.  No masses. BACK:  No CVA tenderness.  No tenderness  on percussion of the back or rib cage. SKIN:  No rashes, ulcers or lesions. EXTREMITIES: No edema, no skin discoloration or tenderness.  No palpable cords. LYMPH NODES: No palpable cervical, supraclavicular, axillary or inguinal adenopathy  NEUROLOGICAL: Unremarkable. PSYCH:  Appropriate.   LAB RESULTS:  Infusion on 05/26/2015  Component Date Value Ref Range Status  . WBC 05/26/2015 4.2  3.6 - 11.0 K/uL Final   A-LINE DRAW  . RBC 05/26/2015 4.23  3.80 - 5.20 MIL/uL Final  . Hemoglobin 05/26/2015 12.2  12.0 - 16.0 g/dL Final  . HCT 05/26/2015 37.0  35.0 - 47.0 % Final  . MCV 05/26/2015 87.5  80.0 - 100.0 fL Final  . MCH 05/26/2015 28.8  26.0 - 34.0 pg Final  . MCHC 05/26/2015 33.0  32.0 - 36.0 g/dL Final  . RDW 05/26/2015 15.8* 11.5 - 14.5 % Final  . Platelets 05/26/2015 164  150 - 440 K/uL Final  . Neutrophils Relative % 05/26/2015 53   Final  . Neutro Abs 05/26/2015 2.3  1.4 - 6.5 K/uL Final  . Lymphocytes Relative 05/26/2015 18   Final  . Lymphs Abs 05/26/2015 0.7* 1.0 - 3.6 K/uL Final  . Monocytes Relative 05/26/2015 12   Final  . Monocytes Absolute 05/26/2015 0.5  0.2 - 0.9 K/uL Final  . Eosinophils Relative 05/26/2015 16   Final  . Eosinophils Absolute 05/26/2015 0.7  0 - 0.7 K/uL Final  . Basophils Relative 05/26/2015 1   Final  . Basophils Absolute 05/26/2015 0.0  0 - 0.1 K/uL Final  . Sodium 05/26/2015 137  135 - 145 mmol/L Final  . Potassium 05/26/2015 3.7  3.5 - 5.1 mmol/L Final  . Chloride 05/26/2015 104  101 - 111 mmol/L Final  . CO2 05/26/2015 28  22 - 32  mmol/L Final  . Glucose, Bld 05/26/2015 112* 65 - 99 mg/dL Final  . BUN 05/26/2015 18  6 - 20 mg/dL Final  . Creatinine, Ser 05/26/2015 0.90  0.44 - 1.00 mg/dL Final  . Calcium 05/26/2015 8.7* 8.9 - 10.3 mg/dL Final  . Total Protein 05/26/2015 7.4  6.5 - 8.1 g/dL Final  . Albumin 05/26/2015 3.7  3.5 - 5.0 g/dL Final  . AST 05/26/2015 19  15 - 41 U/L Final  . ALT 05/26/2015 8* 14 - 54 U/L Final  . Alkaline  Phosphatase 05/26/2015 64  38 - 126 U/L Final  . Total Bilirubin 05/26/2015 0.6  0.3 - 1.2 mg/dL Final  . GFR calc non Af Amer 05/26/2015 >60  >60 mL/min Final  . GFR calc Af Amer 05/26/2015 >60  >60 mL/min Final   Comment: (NOTE) The eGFR has been calculated using the CKD EPI equation. This calculation has not been validated in all clinical situations. eGFR's persistently <60 mL/min signify possible Chronic Kidney Disease.   . Anion gap 05/26/2015 5  5 - 15 Final   IMPRESSION: 1. Interval decrease in the central right lung lesion when comparing back to 09/25/2014, the last study with intravenous contrast material. 2. Interval near resolution of the patchy bilateral ground-glass attenuation seen previously. 3. Circumferential wall thickening in the midesophagus. This may be related to radiation therapy, but esophagitis could have this appearance. 4. Interval resolution of left pleural effusion with decrease in right pleural effusion. 5. Abdominal aortic atherosclerosis    Assessment and plan Stage III non-small cell carcinoma of lung. Patient is improving.  After recent hospitalization for pneumonia patient's condition has improved. Repeat CT scan has been reviewed independently bilateral patchy infiltrate has resolved.  Decrease in the size of mediastinal and lung mass.  Pleural effusion has completely resolved.  I discussed these findings with the patient. Patient was advised to get flu vaccine in cancer center today Patient was advised to call me if spikes any fever or has cough with expectoration or any other problem return appointment in 3 months or before if there is any problem.  All lab data has been reviewed.  During hospital stay patient has developed atrial fibrillation on   eloquis All lab data has been reviewed. The side effect was  grade 1 neuropathy Patient would be evaluated by our survivorship clinic.  Plan would be to repeat PET scan in next few months and follow  patient accordingly   To nccn guide lines 1.  History physical and chest CT scan with contrast 6-12 months for 2 years then history physical and noncontrast enhanced CT scan annually 2.  Smoking cesarean assessment each visit 3.  Immunization with influenza vaccine annually.  Herpes zoster vaccine.  And pneumococcal vaccination with appropriate revaccination 4.  Maintain healthy weight and of physical activity consume healthy diet limit consumption of alcohol Routine blood pressure checkup cholesterol checkup and glucose monitoring.   Jenny Gleason, MD   05/28/2015 9:29 AM

## 2015-05-28 NOTE — Progress Notes (Signed)
Patient continues to have cough.  Patient does not have living will.  Former smoker.

## 2015-05-30 ENCOUNTER — Encounter: Payer: Self-pay | Admitting: Oncology

## 2015-06-23 ENCOUNTER — Other Ambulatory Visit: Payer: Self-pay | Admitting: *Deleted

## 2015-06-23 ENCOUNTER — Encounter: Payer: Self-pay | Admitting: Oncology

## 2015-06-23 DIAGNOSIS — C3491 Malignant neoplasm of unspecified part of right bronchus or lung: Secondary | ICD-10-CM

## 2015-06-24 ENCOUNTER — Inpatient Hospital Stay: Payer: Medicare Other | Attending: Oncology

## 2015-06-24 ENCOUNTER — Encounter: Payer: Self-pay | Admitting: Oncology

## 2015-06-24 ENCOUNTER — Ambulatory Visit
Admission: RE | Admit: 2015-06-24 | Discharge: 2015-06-24 | Disposition: A | Discharge status: Home / Self Care | Payer: Medicare Other | Source: Ambulatory Visit | Attending: Oncology | Admitting: Oncology

## 2015-06-24 ENCOUNTER — Inpatient Hospital Stay (HOSPITAL_BASED_OUTPATIENT_CLINIC_OR_DEPARTMENT_OTHER): Payer: Medicare Other | Admitting: Oncology

## 2015-06-24 VITALS — BP 117/75 | HR 93 | Temp 97.7°F | Wt 152.8 lb

## 2015-06-24 DIAGNOSIS — Z79899 Other long term (current) drug therapy: Secondary | ICD-10-CM | POA: Insufficient documentation

## 2015-06-24 DIAGNOSIS — C3491 Malignant neoplasm of unspecified part of right bronchus or lung: Secondary | ICD-10-CM

## 2015-06-24 DIAGNOSIS — Z7952 Long term (current) use of systemic steroids: Secondary | ICD-10-CM | POA: Diagnosis not present

## 2015-06-24 DIAGNOSIS — Z7901 Long term (current) use of anticoagulants: Secondary | ICD-10-CM | POA: Insufficient documentation

## 2015-06-24 DIAGNOSIS — I5022 Chronic systolic (congestive) heart failure: Secondary | ICD-10-CM | POA: Diagnosis not present

## 2015-06-24 DIAGNOSIS — Z9221 Personal history of antineoplastic chemotherapy: Secondary | ICD-10-CM | POA: Insufficient documentation

## 2015-06-24 DIAGNOSIS — Z923 Personal history of irradiation: Secondary | ICD-10-CM

## 2015-06-24 DIAGNOSIS — I484 Atypical atrial flutter: Secondary | ICD-10-CM | POA: Insufficient documentation

## 2015-06-24 DIAGNOSIS — I480 Paroxysmal atrial fibrillation: Secondary | ICD-10-CM | POA: Diagnosis not present

## 2015-06-24 DIAGNOSIS — Z950 Presence of cardiac pacemaker: Secondary | ICD-10-CM | POA: Insufficient documentation

## 2015-06-24 DIAGNOSIS — I10 Essential (primary) hypertension: Secondary | ICD-10-CM

## 2015-06-24 DIAGNOSIS — E785 Hyperlipidemia, unspecified: Secondary | ICD-10-CM | POA: Diagnosis not present

## 2015-06-24 DIAGNOSIS — Z9861 Coronary angioplasty status: Secondary | ICD-10-CM | POA: Diagnosis not present

## 2015-06-24 DIAGNOSIS — Z45018 Encounter for adjustment and management of other part of cardiac pacemaker: Secondary | ICD-10-CM | POA: Insufficient documentation

## 2015-06-24 DIAGNOSIS — Z951 Presence of aortocoronary bypass graft: Secondary | ICD-10-CM | POA: Diagnosis not present

## 2015-06-24 DIAGNOSIS — I2510 Atherosclerotic heart disease of native coronary artery without angina pectoris: Secondary | ICD-10-CM

## 2015-06-24 DIAGNOSIS — Z87891 Personal history of nicotine dependence: Secondary | ICD-10-CM | POA: Diagnosis not present

## 2015-06-24 DIAGNOSIS — R5383 Other fatigue: Secondary | ICD-10-CM

## 2015-06-24 DIAGNOSIS — R531 Weakness: Secondary | ICD-10-CM

## 2015-06-24 DIAGNOSIS — R918 Other nonspecific abnormal finding of lung field: Secondary | ICD-10-CM

## 2015-06-24 DIAGNOSIS — I252 Old myocardial infarction: Secondary | ICD-10-CM | POA: Diagnosis not present

## 2015-06-24 DIAGNOSIS — G629 Polyneuropathy, unspecified: Secondary | ICD-10-CM

## 2015-06-24 DIAGNOSIS — J449 Chronic obstructive pulmonary disease, unspecified: Secondary | ICD-10-CM

## 2015-06-24 LAB — CBC WITH DIFFERENTIAL/PLATELET
Basophils Absolute: 0 10*3/uL (ref 0–0.1)
Basophils Relative: 1 %
EOS ABS: 0.3 10*3/uL (ref 0–0.7)
Eosinophils Relative: 5 %
HEMATOCRIT: 35.4 % (ref 35.0–47.0)
HEMOGLOBIN: 11.5 g/dL — AB (ref 12.0–16.0)
LYMPHS ABS: 0.7 10*3/uL — AB (ref 1.0–3.6)
LYMPHS PCT: 12 %
MCH: 27.2 pg (ref 26.0–34.0)
MCHC: 32.5 g/dL (ref 32.0–36.0)
MCV: 83.8 fL (ref 80.0–100.0)
Monocytes Absolute: 0.8 10*3/uL (ref 0.2–0.9)
Monocytes Relative: 13 %
NEUTROS ABS: 4.2 10*3/uL (ref 1.4–6.5)
NEUTROS PCT: 69 %
Platelets: 290 10*3/uL (ref 150–440)
RBC: 4.23 MIL/uL (ref 3.80–5.20)
RDW: 15.7 % — ABNORMAL HIGH (ref 11.5–14.5)
WBC: 6 10*3/uL (ref 3.6–11.0)

## 2015-06-24 LAB — COMPREHENSIVE METABOLIC PANEL
ALK PHOS: 71 U/L (ref 38–126)
ALT: 13 U/L — AB (ref 14–54)
AST: 20 U/L (ref 15–41)
Albumin: 3.4 g/dL — ABNORMAL LOW (ref 3.5–5.0)
Anion gap: 7 (ref 5–15)
BILIRUBIN TOTAL: 0.4 mg/dL (ref 0.3–1.2)
BUN: 14 mg/dL (ref 6–20)
CALCIUM: 8.2 mg/dL — AB (ref 8.9–10.3)
CO2: 28 mmol/L (ref 22–32)
CREATININE: 0.97 mg/dL (ref 0.44–1.00)
Chloride: 99 mmol/L — ABNORMAL LOW (ref 101–111)
GFR calc non Af Amer: 55 mL/min — ABNORMAL LOW (ref >60)
GLUCOSE: 126 mg/dL — AB (ref 65–99)
Potassium: 3.7 mmol/L (ref 3.5–5.1)
SODIUM: 134 mmol/L — AB (ref 135–145)
TOTAL PROTEIN: 7.6 g/dL (ref 6.5–8.1)

## 2015-06-24 MED ORDER — PREDNISONE 5 MG PO TABS: ORAL_TABLET | ORAL | Status: DC

## 2015-06-24 MED ORDER — HYDROCOD POLST-CPM POLST ER 10-8 MG/5ML PO SUER: 5.0000 mL | Freq: Every evening | ORAL | Status: DC | PRN

## 2015-06-24 NOTE — Progress Notes (Signed)
Patient states she continues to have cough that interrupts her sleep.  She coughs until she gags.  This happens about 5 times a night.  Has not been using her nebulizer as much.  States she stays cold.  Also states she feels as though she has aged 79 years.  Just wants to sit all the time.  Afraid her cancer will come back.

## 2015-06-24 NOTE — Progress Notes (Signed)
[place] @ [facility] [phone]) [phone]  Fax:(336) [phone]     Wenda Vanschaick OB: 08-Jul-1936  MR#: [id number]  IWO#:[id number]  Patient Care Team: Forest Gleason, MD as PCP - General (Oncology)  CHIEF COMPLAINT:  Chief Complaint  Patient presents with  . 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   Patient had compression of the right mainstem bronchus because of enlarged lymph node and a mass. Stage is T4 N1 M0 tumor stage IIIa diagnosis in January of 2016 2.started on radiation and chemotherapy from October 21, 2014 3.finished 6 cycles of carboplatinum and Taxol  in March  29 th of 2016, As finished concurrent chemoradiation therapy and PET scan shows significant response 4. started on consolidation chemotherapy with carboplatinum and Taxol 2 cycle   Patient finished 2 cycles of consultation therapy on March 06, 2015   5.  Atrial fibrillation diagnosis in August of 2016 on  eloquis     Oncology Flowsheet 02/05/2015 02/26/2015 03/05/2015 04/01/2015 04/02/2015 04/03/2015 04/04/2015  Day, Cycle Day 1, Cycle 2 Day 1, Cycle 1 Day 1, Cycle 1 - - - -  CARBOplatin (PARAPLATIN) IV - 390 mg - - - - -  dexamethasone (DECADRON) IV [ 20 mg ] [ 12 mg ] [ 20 mg ] - - - -  fosaprepitant (EMEND) IV - [ 150 mg ] - - - - -  methylPREDNISolone sodium succinate 40 mg/mL (SOLU-MEDROL) IV - - - 40 mg 40 mg 40 mg -  ondansetron (ZOFRAN) IJ - - - - - - -  ondansetron (ZOFRAN) IV [ 16 mg ] - [ 16 mg ] - - - -  PACLitaxel (TAXOL) IV 80 mg/m2 144 mg 144 mg - - - -  palonosetron (ALOXI) IV - 0.25 mg - - - - -  predniSONE (DELTASONE) PO - - - - - - 20 mg    INTERVAL HISTORY: 79 year old lady was finished chemoradiation therapy came today further follow-up regarding carcinoma of lung.   Patient continues to have dry hacking cough.   Low-grade fever.  Appetite is gradually improving. Feeling somewhat weak and tired    REVIEW OF SYSTEMS:    general status: Patient is feeling weak and tired.. Gradual improvement in the strength or pure of last few days  No change in a performance status.  No chills.  No fever. HEENT: Alopecia.  No evidence of stomatitis Lungs: No cough or shortness of breath Cardiac: No chest pain or paroxysmal nocturnal dyspnea GI: No nausea no vomiting no diarrhea no abdominal pain Skin: No rash Lower extremity no swelling Neurological system: Grade 1 neuropathy  No other focal signs  Musculoskeletal system no bony pains  As per HPI. Otherwise, a complete review of systems is negatve.  PAST MEDICAL HISTORY: Past Medical History  Diagnosis Date  . HTN (hypertension)   . Pacemaker     a. MDT 2002; b. generator replacement 2013; c. followed by Dr. Omelia Blackwater, MD  . HLD (hyperlipidemia)   . CAD (coronary artery disease)     a. s/p MI x 2 in 2002 s/p PCI x 2 in 2002; b. s/p 2v CABG 2002; c. stress echo 07/2004 w/ evi of pos & inf infarct & no evi of ischemia; d. 4/08 dipyridamole scan w/ multiple areas of infarct, no ischemia, EF 49%;  e. cath 04/28/15 3v CAD, med Rx rec, no targets for revasc, LM lum irregs, pLAD 30%, 100%, ost-pLCx 60%, [name] 99%, OM2 100%, p-mRCA 90%, [name] 100% L-R collats, VG-mLAD irregs, VG-OM2 oc  . Lung cancer ([place])   . History of colonoscopy 2013  . History of mammography, screening 2015  . History of Papanicolaou smear of cervix 2013  . Carcinoma of right lung ([place]) 01/03/2015    a. followed by Dr. Oliva Bustard  . Mitral regurgitation     a. s/p mitral ring placement 09/2000; b. echo 09/2010: EF 50%, inf HK, post AK, mild MR, prosthetic mitral valve ring w/ peak gradient of 10 mmHg; b. echo 2/13: EF 50%, mild MR/TR     . History of blood clots     12/2001  . Atypical atrial flutter ([place])     a. s/p ablation 07/27/2013  . PAF (paroxysmal atrial fibrillation) ([facility])     a. on Eliquis     . COPD (chronic obstructive pulmonary disease) ([place])   . Chronic systolic CHF (congestive heart failure) ([place])     a. echo 03/2015: EF 30-35%, sev ant/inf/pos HK, in mild to mod MR    PAST SURGICAL HISTORY: Past Surgical History  Procedure Laterality Date  . Appendectomy    . Vaginal hysterectomy    . Cardiac bypass    . Pacemaker insertion N/A   . Cardiac catheterization N/A 04/28/2015    Procedure: Left Heart Cath and Coronary Angiography;  Surgeon: Wellington Hampshire, MD;  Location: [place] [facility];  Service: Cardiovascular;  Laterality: N/A;    FAMILY HISTORY Family History  Problem Relation Age of Onset  . COPD Mother     sister, and brother        ADVANCED DIRECTIVES:Patient does not have any advanced healthcare directive. Information has been given. HEALTH MAINTENANCE: Social History  Substance Use Topics  . Smoking status: Former Smoker -- 2.00 packs/day for 42 years    Types: Cigarettes    Quit date: 08/30/2000  . Smokeless tobacco: None     Comment: quit smoking in 08/28/2000  . Alcohol Use: 6.0 oz/week    10 Glasses of wine per week     Comment: 2 glasses of wine per day     Allergies  Allergen Reactions  . Lovenox [Enoxaparin Sodium] Itching  . Meperidine Other (See Comments)    Other Reaction: pt does not like how it makes her feel    Current Outpatient Prescriptions  Medication Sig Dispense Refill  . apixaban (ELIQUIS) 5 MG TABS tablet Take 5 mg by mouth 2 (two) times daily.    Marland Kitchen atorvastatin (LIPITOR) 20 MG tablet Take 20 mg by mouth daily.    . calcium carbonate (OS-CAL) 600 MG TABS tablet Take 600 mg by mouth 2 (two) times daily with a meal.    . Cetirizine HCl 10 MG CAPS Take 1 capsule (10 mg total) by mouth 1 day or 1 dose. 30 capsule 5  . ipratropium (ATROVENT) 0.03 % nasal spray Place 2 sprays into both nostrils 2 (two) times daily. 30 mL 5  . ipratropium-albuterol (DUONEB) 0.5-2.5 (3) MG/3ML SOLN TAKE 3ML BY NEBULIZER EVERY 6 HOURS AS  NEEDED  2  . levothyroxine (SYNTHROID, LEVOTHROID) 75 MCG tablet Take 75 mcg by mouth daily before breakfast.    . metoprolol succinate (TOPROL XL) 25 MG 24 hr tablet Take 1 tablet (25 mg total) by mouth daily. Take 69m every morning and 538mevery evening 90 tablet 5  .  omeprazole (PRILOSEC) 20 MG capsule Take 20 mg by mouth daily.    . sacubitril-valsartan (ENTRESTO) 24-26 MG Take 1 tablet by mouth 2 (two) times daily. 60 tablet 5   No current facility-administered medications for this visit.   Facility-Administered Medications Ordered in Other Visits  Medication Dose Route Frequency Provider Last Rate Last Dose  . sodium chloride 0.9 % injection 10 mL  10 mL Intravenous PRN Forest Gleason, MD   10 mL at 02/19/15 1000    OBJECTIVE:  Filed Vitals:   06/24/15 1149  BP: 117/75  Pulse: 93  Temp: 97.7 F (36.5 C)     Body mass index is 23.92 kg/(m^2).    ECOG FS:1 - Symptomatic but completely ambulatory  PHYSICAL EXAM: GENERAL:  Well developed, well nourished, sitting comfortably in the exam room in no acute distress. MENTAL STATUS:  Alert and oriented to person, place and time. HEAD: alopecia Normocephalic, atraumatic, face symmetric, no Cushingoid features. EYES: eyes.  Pupils equal round and reactive to light and accomodation.  No conjunctivitis or scleral icterus. ENT:  Oropharynx clear without lesion.  Tongue normal. Mucous membranes moist.  RESPIRATORY:  Clear to auscultation without rales, wheezes or rhonchi. CARDIOVASCULAR:  Regular rate and rhythm without murmur, rub or gallop. . ABDOMEN:  Soft, non-tender, with active bowel sounds, and no hepatosplenomegaly.  No masses. BACK:  No CVA tenderness.  No tenderness on percussion of the back or rib cage. SKIN:  No rashes, ulcers or lesions. EXTREMITIES: No edema, no skin discoloration or tenderness.  No palpable cords. LYMPH NODES: No palpable cervical, supraclavicular, axillary or inguinal adenopathy  NEUROLOGICAL:  Unremarkable. PSYCH:  Appropriate.   LAB RESULTS:  Appointment on 06/24/2015  Component Date Value Ref Range Status  . WBC 06/24/2015 6.0  3.6 - 11.0 K/uL Final  . RBC 06/24/2015 4.23  3.80 - 5.20 MIL/uL Final  . Hemoglobin 06/24/2015 11.5* 12.0 - 16.0 g/dL Final  . HCT 06/24/2015 35.4  35.0 - 47.0 % Final  . MCV 06/24/2015 83.8  80.0 - 100.0 fL Final  . MCH 06/24/2015 27.2  26.0 - 34.0 pg Final  . MCHC 06/24/2015 32.5  32.0 - 36.0 g/dL Final  . RDW 06/24/2015 15.7* 11.5 - 14.5 % Final  . Platelets 06/24/2015 290  150 - 440 K/uL Final  . Neutrophils Relative % 06/24/2015 69   Final  . Neutro Abs 06/24/2015 4.2  1.4 - 6.5 K/uL Final  . Lymphocytes Relative 06/24/2015 12   Final  . Lymphs Abs 06/24/2015 0.7* 1.0 - 3.6 K/uL Final  . Monocytes Relative 06/24/2015 13   Final  . Monocytes Absolute 06/24/2015 0.8  0.2 - 0.9 K/uL Final  . Eosinophils Relative 06/24/2015 5   Final  . Eosinophils Absolute 06/24/2015 0.3  0 - 0.7 K/uL Final  . Basophils Relative 06/24/2015 1   Final  . Basophils Absolute 06/24/2015 0.0  0 - 0.1 K/uL Final  . Sodium 06/24/2015 134* 135 - 145 mmol/L Final  . Potassium 06/24/2015 3.7  3.5 - 5.1 mmol/L Final  . Chloride 06/24/2015 99* 101 - 111 mmol/L Final  . CO2 06/24/2015 28  22 - 32 mmol/L Final  . Glucose, Bld 06/24/2015 126* 65 - 99 mg/dL Final  . BUN 06/24/2015 14  6 - 20 mg/dL Final  . Creatinine, Ser 06/24/2015 0.97  0.44 - 1.00 mg/dL Final  . Calcium 06/24/2015 8.2* 8.9 - 10.3 mg/dL Final  . Total Protein 06/24/2015 7.6  6.5 - 8.1 g/dL Final  .  Albumin 06/24/2015 3.4* 3.5 - 5.0 g/dL Final  . AST 06/24/2015 20  15 - 41 U/L Final  . ALT 06/24/2015 13* 14 - 54 U/L Final  . Alkaline Phosphatase 06/24/2015 71  38 - 126 U/L Final  . Total Bilirubin 06/24/2015 0.4  0.3 - 1.2 mg/dL Final  . GFR calc non Af Amer 06/24/2015 55* >60 mL/min Final  . GFR calc Af Amer 06/24/2015 >60  >60 mL/min Final   Comment: (NOTE) The eGFR has been calculated using the  CKD EPI equation. This calculation has not been validated in all clinical situations. eGFR's persistently <60 mL/min signify possible Chronic Kidney Disease.   . Anion gap 06/24/2015 7  5 - 15 Final   IMPRESSION: 1. Interval decrease in the central right lung lesion when comparing back to 09/25/2014, the last study with intravenous contrast material. 2. Interval near resolution of the patchy bilateral ground-glass attenuation seen previously. 3. Circumferential wall thickening in the midesophagus. This may be related to radiation therapy, but esophagitis could have this appearance. 4. Interval resolution of left pleural effusion with decrease in right pleural effusion. 5. Abdominal aortic atherosclerosis June 24, 2015  IMPRESSION: Slight interval increased constant acuity of the perihilar mass. There may be mild subsegmental postobstructive atelectasis in the right middle and inferior right upper lobe. There is no evidence of CHF.  Assessment and plan Stage III non-small cell carcinoma of lung. Patient presented has with symptoms of what appears to be radiation pneumonitis.  Patient has been started on steroid tapering dose Chest x-rays been reviewed independently If there is no improvement in symptoms and repeat CT scan would be done  June 24, 2015 Forest Gleason, MD   06/24/2015 12:36 PM

## 2015-07-04 ENCOUNTER — Encounter: Payer: Self-pay | Admitting: Oncology

## 2015-07-14 ENCOUNTER — Encounter: Payer: Self-pay | Admitting: Radiation Oncology

## 2015-07-14 ENCOUNTER — Inpatient Hospital Stay: Payer: Medicare Other | Attending: Oncology | Admitting: Oncology

## 2015-07-14 ENCOUNTER — Ambulatory Visit
Admission: RE | Admit: 2015-07-14 | Discharge: 2015-07-14 | Disposition: A | Discharge status: Home / Self Care | Payer: Medicare Other | Source: Ambulatory Visit | Attending: Radiation Oncology | Admitting: Radiation Oncology

## 2015-07-14 ENCOUNTER — Encounter: Payer: Self-pay | Admitting: Oncology

## 2015-07-14 ENCOUNTER — Inpatient Hospital Stay: Payer: Medicare Other

## 2015-07-14 VITALS — BP 128/85 | HR 88 | Temp 96.9°F | Wt 154.1 lb

## 2015-07-14 VITALS — BP 132/78 | HR 91 | Temp 96.8°F | Resp 18 | Wt 154.3 lb

## 2015-07-14 DIAGNOSIS — I4891 Unspecified atrial fibrillation: Secondary | ICD-10-CM | POA: Diagnosis not present

## 2015-07-14 DIAGNOSIS — Y842 Radiological procedure and radiotherapy as the cause of abnormal reaction of the patient, or of later complication, without mention of misadventure at the time of the procedure: Secondary | ICD-10-CM | POA: Diagnosis not present

## 2015-07-14 DIAGNOSIS — I252 Old myocardial infarction: Secondary | ICD-10-CM | POA: Diagnosis not present

## 2015-07-14 DIAGNOSIS — E785 Hyperlipidemia, unspecified: Secondary | ICD-10-CM | POA: Diagnosis not present

## 2015-07-14 DIAGNOSIS — J700 Acute pulmonary manifestations due to radiation: Secondary | ICD-10-CM

## 2015-07-14 DIAGNOSIS — F419 Anxiety disorder, unspecified: Secondary | ICD-10-CM | POA: Diagnosis not present

## 2015-07-14 DIAGNOSIS — I700 Atherosclerosis of aorta: Secondary | ICD-10-CM | POA: Diagnosis not present

## 2015-07-14 DIAGNOSIS — Z923 Personal history of irradiation: Secondary | ICD-10-CM | POA: Diagnosis not present

## 2015-07-14 DIAGNOSIS — Z79899 Other long term (current) drug therapy: Secondary | ICD-10-CM | POA: Diagnosis not present

## 2015-07-14 DIAGNOSIS — C3491 Malignant neoplasm of unspecified part of right bronchus or lung: Secondary | ICD-10-CM

## 2015-07-14 DIAGNOSIS — Z7952 Long term (current) use of systemic steroids: Secondary | ICD-10-CM | POA: Diagnosis not present

## 2015-07-14 DIAGNOSIS — J440 Chronic obstructive pulmonary disease with acute lower respiratory infection: Secondary | ICD-10-CM | POA: Diagnosis not present

## 2015-07-14 DIAGNOSIS — J209 Acute bronchitis, unspecified: Secondary | ICD-10-CM | POA: Diagnosis not present

## 2015-07-14 DIAGNOSIS — T451X5S Adverse effect of antineoplastic and immunosuppressive drugs, sequela: Secondary | ICD-10-CM | POA: Diagnosis not present

## 2015-07-14 DIAGNOSIS — Z7901 Long term (current) use of anticoagulants: Secondary | ICD-10-CM | POA: Insufficient documentation

## 2015-07-14 DIAGNOSIS — R531 Weakness: Secondary | ICD-10-CM | POA: Insufficient documentation

## 2015-07-14 DIAGNOSIS — L658 Other specified nonscarring hair loss: Secondary | ICD-10-CM | POA: Insufficient documentation

## 2015-07-14 DIAGNOSIS — I10 Essential (primary) hypertension: Secondary | ICD-10-CM | POA: Insufficient documentation

## 2015-07-14 DIAGNOSIS — G620 Drug-induced polyneuropathy: Secondary | ICD-10-CM

## 2015-07-14 DIAGNOSIS — C771 Secondary and unspecified malignant neoplasm of intrathoracic lymph nodes: Secondary | ICD-10-CM | POA: Diagnosis not present

## 2015-07-14 DIAGNOSIS — Z950 Presence of cardiac pacemaker: Secondary | ICD-10-CM | POA: Diagnosis not present

## 2015-07-14 DIAGNOSIS — Z87891 Personal history of nicotine dependence: Secondary | ICD-10-CM | POA: Diagnosis not present

## 2015-07-14 DIAGNOSIS — Z9221 Personal history of antineoplastic chemotherapy: Secondary | ICD-10-CM | POA: Insufficient documentation

## 2015-07-14 DIAGNOSIS — R5383 Other fatigue: Secondary | ICD-10-CM | POA: Insufficient documentation

## 2015-07-14 LAB — COMPREHENSIVE METABOLIC PANEL
ALK PHOS: 85 U/L (ref 38–126)
ALT: 33 U/L (ref 14–54)
ANION GAP: 8 (ref 5–15)
AST: 24 U/L (ref 15–41)
Albumin: 3.7 g/dL (ref 3.5–5.0)
BILIRUBIN TOTAL: 0.9 mg/dL (ref 0.3–1.2)
BUN: 24 mg/dL — ABNORMAL HIGH (ref 6–20)
CALCIUM: 9 mg/dL (ref 8.9–10.3)
CO2: 30 mmol/L (ref 22–32)
Chloride: 96 mmol/L — ABNORMAL LOW (ref 101–111)
Creatinine, Ser: 0.87 mg/dL (ref 0.44–1.00)
GLUCOSE: 126 mg/dL — AB (ref 65–99)
Potassium: 3.9 mmol/L (ref 3.5–5.1)
Sodium: 134 mmol/L — ABNORMAL LOW (ref 135–145)
TOTAL PROTEIN: 7.5 g/dL (ref 6.5–8.1)

## 2015-07-14 LAB — CBC WITH DIFFERENTIAL/PLATELET
Basophils Absolute: 0 10*3/uL (ref 0–0.1)
Basophils Relative: 0 %
Eosinophils Absolute: 0.2 10*3/uL (ref 0–0.7)
Eosinophils Relative: 2 %
HEMATOCRIT: 42.3 % (ref 35.0–47.0)
HEMOGLOBIN: 13.6 g/dL (ref 12.0–16.0)
LYMPHS ABS: 0.6 10*3/uL — AB (ref 1.0–3.6)
Lymphocytes Relative: 6 %
MCH: 26.9 pg (ref 26.0–34.0)
MCHC: 32.2 g/dL (ref 32.0–36.0)
MCV: 83.6 fL (ref 80.0–100.0)
MONOS PCT: 7 %
Monocytes Absolute: 0.7 10*3/uL (ref 0.2–0.9)
NEUTROS ABS: 9.3 10*3/uL — AB (ref 1.4–6.5)
NEUTROS PCT: 85 %
Platelets: 242 10*3/uL (ref 150–440)
RBC: 5.06 MIL/uL (ref 3.80–5.20)
RDW: 17.8 % — ABNORMAL HIGH (ref 11.5–14.5)
WBC: 10.8 10*3/uL (ref 3.6–11.0)

## 2015-07-14 NOTE — Progress Notes (Signed)
[place] @ [facility] Telephone:([phone]) [phone]  Fax:([phone]) [phone]     Manuella Blackson OB: 01-20-1936  MR#: [id number]  OAC#:[id number]  Patient Care Team: Forest Gleason, MD as PCP - General (Oncology)  CHIEF COMPLAINT:  Chief Complaint  Patient presents with  . 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   Patient had compression of the right mainstem bronchus because of enlarged lymph node and a mass. Stage is T4 N1 M0 tumor stage IIIa diagnosis in January of 2016 2.started on radiation and chemotherapy from October 21, 2014 3.finished 6 cycles of carboplatinum and Taxol  in March  29 th of 2016, As finished concurrent chemoradiation therapy and PET scan shows significant response 4. started on consolidation chemotherapy with carboplatinum and Taxol 2 cycle   Patient finished 2 cycles of consultation therapy on March 06, 2015   5.  Atrial fibrillation diagnosis in August of 2016 on  eloquis     Oncology Flowsheet 02/05/2015 02/26/2015 03/05/2015 04/01/2015 04/02/2015 04/03/2015 04/04/2015  Day, Cycle Day 1, Cycle 2 Day 1, Cycle 1 Day 1, Cycle 1 - - - -  CARBOplatin (PARAPLATIN) IV - 390 mg - - - - -  dexamethasone (DECADRON) IV [ 20 mg ] [ 12 mg ] [ 20 mg ] - - - -  fosaprepitant (EMEND) IV - [ 150 mg ] - - - - -  methylPREDNISolone sodium succinate 40 mg/mL (SOLU-MEDROL) IV - - - 40 mg 40 mg 40 mg -  ondansetron (ZOFRAN) IJ - - - - - - -  ondansetron (ZOFRAN) IV [ 16 mg ] - [ 16 mg ] - - - -  PACLitaxel (TAXOL) IV 80 mg/m2 144 mg 144 mg - - - -  palonosetron (ALOXI) IV - 0.25 mg - - - - -  predniSONE (DELTASONE) PO - - - - - - 20 mg    INTERVAL HISTORY: 79 year old lady was finished chemoradiation therapy came today further follow-up regarding carcinoma of lung.   On prednisone and dry hacking cough is  improved She is here for ongoing evaluation and treatment consideration No chest pain.   Patient still has 3 more days steroid REVIEW OF SYSTEMS:    general status: Patient is feeling weak and tired.. Gradual improvement in the strength or pure of last few days  No change in a performance status.  No chills.  No fever. HEENT: Alopecia.  No evidence of stomatitis Lungs: No cough or shortness of breath Cardiac: No chest pain or paroxysmal nocturnal dyspnea GI: No nausea no vomiting no diarrhea no abdominal pain Skin: No rash Lower extremity no swelling Neurological system: Grade 1 neuropathy  No other focal signs  Musculoskeletal system no bony pains  As per HPI. Otherwise, a complete review of systems is negatve.  PAST MEDICAL HISTORY: Past Medical History  Diagnosis Date  . HTN (hypertension)   . Pacemaker     a. MDT 2002; b. generator replacement 2013; c. followed by Dr. Omelia Blackwater, MD  . HLD (hyperlipidemia)   . CAD (coronary artery disease)     a. s/p MI x 2 in 2002 s/p PCI x 2 in 2002; b. s/p 2v CABG 2002; c. stress echo 07/2004 w/ evi of pos & inf infarct & no evi of ischemia; d. 4/08 dipyridamole scan w/ multiple areas  of infarct, no ischemia, EF 49%; e. cath 04/28/15 3v CAD, med Rx rec, no targets for revasc, LM lum irregs, pLAD 30%, 100%, ost-pLCx 60%, [name] 99%, OM2 100%, p-mRCA 90%, [name] 100% L-R collats, VG-mLAD irregs, VG-OM2 oc  . Lung cancer ([place])   . History of colonoscopy 2013  . History of mammography, screening 2015  . History of Papanicolaou smear of cervix 2013  . Carcinoma of right lung ([place]) 01/03/2015    a. followed by Dr. Oliva Bustard  . Mitral regurgitation     a. s/p mitral ring placement 09/2000; b. echo 09/2010: EF 50%, inf HK, post AK, mild MR, prosthetic mitral valve ring w/ peak gradient of 10 mmHg; b. echo 2/13: EF 50%, mild MR/TR     . History of blood clots     12/2001  . Atypical atrial flutter ([place])     a. s/p ablation 07/27/2013  . PAF (paroxysmal  atrial fibrillation) ([facility])     a. on Eliquis   . COPD (chronic obstructive pulmonary disease) ([place])   . Chronic systolic CHF (congestive heart failure) ([place])     a. echo 03/2015: EF 30-35%, sev ant/inf/pos HK, in mild to mod MR    PAST SURGICAL HISTORY: Past Surgical History  Procedure Laterality Date  . Appendectomy    . Vaginal hysterectomy    . Cardiac bypass    . Pacemaker insertion N/A   . Cardiac catheterization N/A 04/28/2015    Procedure: Left Heart Cath and Coronary Angiography;  Surgeon: Wellington Hampshire, MD;  Location: [place] [facility];  Service: Cardiovascular;  Laterality: N/A;    FAMILY HISTORY Family History  Problem Relation Age of Onset  . COPD Mother     sister, and brother        ADVANCED DIRECTIVES:Patient does not have any advanced healthcare directive. Information has been given. HEALTH MAINTENANCE: Social History  Substance Use Topics  . Smoking status: Former Smoker -- 2.00 packs/day for 42 years    Types: Cigarettes    Quit date: 08/30/2000  . Smokeless tobacco: None     Comment: quit smoking in 08/28/2000  . Alcohol Use: 6.0 oz/week    10 Glasses of wine per week     Comment: 2 glasses of wine per day     Allergies  Allergen Reactions  . Lovenox [Enoxaparin Sodium] Itching  . Meperidine Other (See Comments)    Other Reaction: pt does not like how it makes her feel    Current Outpatient Prescriptions  Medication Sig Dispense Refill  . apixaban (ELIQUIS) 5 MG TABS tablet Take 5 mg by mouth 2 (two) times daily.    Marland Kitchen atorvastatin (LIPITOR) 20 MG tablet Take 20 mg by mouth daily.    . calcium carbonate (OS-CAL) 600 MG TABS tablet Take 600 mg by mouth 2 (two) times daily with a meal.    . Cetirizine HCl 10 MG CAPS Take 1 capsule (10 mg total) by mouth 1 day or 1 dose. 30 capsule 5  . chlorpheniramine-HYDROcodone (TUSSIONEX PENNKINETIC ER) 10-8 MG/5ML SUER Take 5 mLs by mouth at bedtime as needed for cough. 140 mL 0  . ipratropium  (ATROVENT) 0.03 % nasal spray Place 2 sprays into both nostrils 2 (two) times daily. 30 mL 5  . ipratropium-albuterol (DUONEB) 0.5-2.5 (3) MG/3ML SOLN TAKE 3ML BY NEBULIZER EVERY 6 HOURS AS NEEDED  2  . levothyroxine (SYNTHROID, LEVOTHROID) 75 MCG tablet Take 75 mcg by mouth daily before breakfast.    . metoprolol  succinate (TOPROL XL) 25 MG 24 hr tablet Take 1 tablet (25 mg total) by mouth daily. Take 91m every morning and 558mevery evening 90 tablet 5  . omeprazole (PRILOSEC) 20 MG capsule Take 20 mg by mouth daily.    . predniSONE (DELTASONE) 5 MG tablet Start 6064maper by 2.5mg56mily until complete. 150 tablet 0  . sacubitril-valsartan (ENTRESTO) 24-26 MG Take 1 tablet by mouth 2 (two) times daily. 60 tablet 5   No current facility-administered medications for this visit.   Facility-Administered Medications Ordered in Other Visits  Medication Dose Route Frequency Provider Last Rate Last Dose  . sodium chloride 0.9 % injection 10 mL  10 mL Intravenous PRN JanaForest Gleason   10 mL at 02/19/15 1000    OBJECTIVE:  Filed Vitals:   07/14/15 1215  BP: 128/85  Pulse: 88  Temp: 96.9 F (36.1 C)     Body mass index is 24.13 kg/(m^2).    ECOG FS:1 - Symptomatic but completely ambulatory  PHYSICAL EXAM: GENERAL:  Well developed, well nourished, sitting comfortably in the exam room in no acute distress. MENTAL STATUS:  Alert and oriented to person, place and time. HEAD: alopecia Normocephalic, atraumatic, face symmetric, no Cushingoid features. EYES: eyes.  Pupils equal round and reactive to light and accomodation.  No conjunctivitis or scleral icterus. ENT:  Oropharynx clear without lesion.  Tongue normal. Mucous membranes moist.  RESPIRATORY:  Clear to auscultation without rales, wheezes or rhonchi. CARDIOVASCULAR:  Regular rate and rhythm without murmur, rub or gallop. . ABDOMEN:  Soft, non-tender, with active bowel sounds, and no hepatosplenomegaly.  No masses. BACK:  No CVA  tenderness.  No tenderness on percussion of the back or rib cage. SKIN:  No rashes, ulcers or lesions. EXTREMITIES: No edema, no skin discoloration or tenderness.  No palpable cords. LYMPH NODES: No palpable cervical, supraclavicular, axillary or inguinal adenopathy  NEUROLOGICAL: Unremarkable. PSYCH:  Appropriate.   LAB RESULTS:  Appointment on 07/14/2015  Component Date Value Ref Range Status  . WBC 07/14/2015 10.8  3.6 - 11.0 K/uL Final  . RBC 07/14/2015 5.06  3.80 - 5.20 MIL/uL Final  . Hemoglobin 07/14/2015 13.6  12.0 - 16.0 g/dL Final  . HCT 07/14/2015 42.3  35.0 - 47.0 % Final  . MCV 07/14/2015 83.6  80.0 - 100.0 fL Final  . MCH 07/14/2015 26.9  26.0 - 34.0 pg Final  . MCHC 07/14/2015 32.2  32.0 - 36.0 g/dL Final  . RDW 07/14/2015 17.8* 11.5 - 14.5 % Final  . Platelets 07/14/2015 242  150 - 440 K/uL Final  . Neutrophils Relative % 07/14/2015 85   Final  . Neutro Abs 07/14/2015 9.3* 1.4 - 6.5 K/uL Final  . Lymphocytes Relative 07/14/2015 6   Final  . Lymphs Abs 07/14/2015 0.6* 1.0 - 3.6 K/uL Final  . Monocytes Relative 07/14/2015 7   Final  . Monocytes Absolute 07/14/2015 0.7  0.2 - 0.9 K/uL Final  . Eosinophils Relative 07/14/2015 2   Final  . Eosinophils Absolute 07/14/2015 0.2  0 - 0.7 K/uL Final  . Basophils Relative 07/14/2015 0   Final  . Basophils Absolute 07/14/2015 0.0  0 - 0.1 K/uL Final  . Sodium 07/14/2015 134* 135 - 145 mmol/L Final  . Potassium 07/14/2015 3.9  3.5 - 5.1 mmol/L Final  . Chloride 07/14/2015 96* 101 - 111 mmol/L Final  . CO2 07/14/2015 30  22 - 32 mmol/L Final  . Glucose, Bld 07/14/2015 126* 65 - 99 mg/dL  Final  . BUN 07/14/2015 24* 6 - 20 mg/dL Final  . Creatinine, Ser 07/14/2015 0.87  0.44 - 1.00 mg/dL Final  . Calcium 07/14/2015 9.0  8.9 - 10.3 mg/dL Final  . Total Protein 07/14/2015 7.5  6.5 - 8.1 g/dL Final  . Albumin 07/14/2015 3.7  3.5 - 5.0 g/dL Final  . AST 07/14/2015 24  15 - 41 U/L Final  . ALT 07/14/2015 33  14 - 54 U/L Final  .  Alkaline Phosphatase 07/14/2015 85  38 - 126 U/L Final  . Total Bilirubin 07/14/2015 0.9  0.3 - 1.2 mg/dL Final  . GFR calc non Af Amer 07/14/2015 >60  >60 mL/min Final  . GFR calc Af Amer 07/14/2015 >60  >60 mL/min Final   Comment: (NOTE) The eGFR has been calculated using the CKD EPI equation. This calculation has not been validated in all clinical situations. eGFR's persistently <60 mL/min signify possible Chronic Kidney Disease.   . Anion gap 07/14/2015 8  5 - 15 Final   IMPRESSION: 1. Interval decrease in the central right lung lesion when comparing back to 09/25/2014, the last study with intravenous contrast material. 2. Interval near resolution of the patchy bilateral ground-glass attenuation seen previously. 3. Circumferential wall thickening in the midesophagus. This may be related to radiation therapy, but esophagitis could have this appearance. 4. Interval resolution of left pleural effusion with decrease in right pleural effusion. 5. Abdominal aortic atherosclerosis June 24, 2015  IMPRESSION: Slight interval increased constant acuity of the perihilar mass. There may be mild subsegmental postobstructive atelectasis in the right middle and inferior right upper lobe. There is no evidence of CHF.  Assessment and plan Stage III non-small cell carcinoma of lung. Radiation pneumonitis responding to the prednisone taper dose Will repeat another CT scan in February for reevaluation and restaging Patient was instructed to call me if cough continues. Generalized improvement in medical condition All lab data has been reviewed June 24, 2015 Forest Gleason, MD   07/14/2015 12:58 PM

## 2015-07-14 NOTE — Progress Notes (Signed)
Patient c/o sinus drainage and continued cough.

## 2015-07-14 NOTE — Progress Notes (Signed)
Radiation Oncology Follow up Note  Name: Jenny Cooper   Date:   07/14/2015 MRN:  [id number] DOB: 02-04-1936    This 79 y.o. female presents to the clinic today for follow-up for stage IIIa squamous cell carcinoma the right lung hilum now out 6 months.  REFERRING PROVIDER: Gayland Curry, MD  HPI: Patient is a 79 year old female now out 6 months having completed combined modality treatment for stage IIIa (T4 N1 M0) diagnosed in January 2016. She had excellent response by PET PET CT scan was started on consolidative chemotherapy with carboplatinum and Taxol which she finished in July 2016. She is seen today in routine follow-up doing fairly well she still has a persistent cough although it has been improving.Marland Kitchen She was treated with tapering steroid dose. She specifically denies hemoptysis. She had a CT scan back in September showing interval decrease in central right lung lesion.  COMPLICATIONS OF TREATMENT: none  FOLLOW UP COMPLIANCE: keeps appointments   PHYSICAL EXAM:  BP 132/78 mmHg  Pulse 91  Temp(Src) 96.8 F (36 C)  Resp 18  Wt 154 lb 5.2 oz (70 kg) Well-developed well-nourished patient in NAD. HEENT reveals PERLA, EOMI, discs not visualized.  Oral cavity is clear. No oral mucosal lesions are identified. Neck is clear without evidence of cervical or supraclavicular adenopathy. Lungs are clear to A&P. Cardiac examination is essentially unremarkable with regular rate and rhythm without murmur rub or thrill. Abdomen is benign with no organomegaly or masses noted. Motor sensory and DTR levels are equal and symmetric in the upper and lower extremities. Cranial nerves II through XII are grossly intact. Proprioception is intact. No peripheral adenopathy or edema is identified. No motor or sensory levels are noted. Crude visual fields are within normal range.  RADIOLOGY RESULTS: Serial CT scans are reviewed compatible with the above-stated findings  PLAN: I've assured the patient  over time her cough will diminish greatly. Seems to be heading in that direction at this time she's had an excellent result by CT criteria. I am please were overall progress. She continues on Mucinex and other therapies for her persistent cough. I have asked to see her back in 6 months for follow-up. She continues close follow-up care with medical oncology.  I would like to take this opportunity for allowing me to participate in the care of your patient.Armstead Peaks., MD

## 2015-07-28 ENCOUNTER — Encounter: Payer: Self-pay | Admitting: Oncology

## 2015-07-29 ENCOUNTER — Inpatient Hospital Stay (HOSPITAL_BASED_OUTPATIENT_CLINIC_OR_DEPARTMENT_OTHER): Payer: Medicare Other | Admitting: Oncology

## 2015-07-29 ENCOUNTER — Other Ambulatory Visit: Payer: Self-pay | Admitting: *Deleted

## 2015-07-29 ENCOUNTER — Inpatient Hospital Stay: Payer: Medicare Other

## 2015-07-29 VITALS — BP 99/73 | HR 106 | Temp 99.3°F | Wt 154.5 lb

## 2015-07-29 DIAGNOSIS — Z9221 Personal history of antineoplastic chemotherapy: Secondary | ICD-10-CM

## 2015-07-29 DIAGNOSIS — Z87891 Personal history of nicotine dependence: Secondary | ICD-10-CM

## 2015-07-29 DIAGNOSIS — J440 Chronic obstructive pulmonary disease with acute lower respiratory infection: Secondary | ICD-10-CM

## 2015-07-29 DIAGNOSIS — Z923 Personal history of irradiation: Secondary | ICD-10-CM | POA: Diagnosis not present

## 2015-07-29 DIAGNOSIS — J700 Acute pulmonary manifestations due to radiation: Secondary | ICD-10-CM

## 2015-07-29 DIAGNOSIS — R5383 Other fatigue: Secondary | ICD-10-CM

## 2015-07-29 DIAGNOSIS — T451X5S Adverse effect of antineoplastic and immunosuppressive drugs, sequela: Secondary | ICD-10-CM

## 2015-07-29 DIAGNOSIS — C3491 Malignant neoplasm of unspecified part of right bronchus or lung: Secondary | ICD-10-CM

## 2015-07-29 DIAGNOSIS — Y842 Radiological procedure and radiotherapy as the cause of abnormal reaction of the patient, or of later complication, without mention of misadventure at the time of the procedure: Secondary | ICD-10-CM

## 2015-07-29 DIAGNOSIS — G620 Drug-induced polyneuropathy: Secondary | ICD-10-CM

## 2015-07-29 DIAGNOSIS — J209 Acute bronchitis, unspecified: Secondary | ICD-10-CM

## 2015-07-29 DIAGNOSIS — C771 Secondary and unspecified malignant neoplasm of intrathoracic lymph nodes: Secondary | ICD-10-CM | POA: Diagnosis not present

## 2015-07-29 LAB — CBC WITH DIFFERENTIAL/PLATELET
BASOS ABS: 0 10*3/uL (ref 0–0.1)
Basophils Relative: 0 %
EOS PCT: 2 %
Eosinophils Absolute: 0.1 10*3/uL (ref 0–0.7)
HEMATOCRIT: 33.7 % — AB (ref 35.0–47.0)
HEMOGLOBIN: 11 g/dL — AB (ref 12.0–16.0)
LYMPHS ABS: 0.6 10*3/uL — AB (ref 1.0–3.6)
LYMPHS PCT: 10 %
MCH: 26.2 pg (ref 26.0–34.0)
MCHC: 32.5 g/dL (ref 32.0–36.0)
MCV: 80.5 fL (ref 80.0–100.0)
Monocytes Absolute: 0.8 10*3/uL (ref 0.2–0.9)
Monocytes Relative: 14 %
NEUTROS ABS: 4.4 10*3/uL (ref 1.4–6.5)
Neutrophils Relative %: 74 %
Platelets: 298 10*3/uL (ref 150–440)
RBC: 4.19 MIL/uL (ref 3.80–5.20)
RDW: 17.8 % — ABNORMAL HIGH (ref 11.5–14.5)
WBC: 6 10*3/uL (ref 3.6–11.0)

## 2015-07-29 LAB — COMPREHENSIVE METABOLIC PANEL
ALK PHOS: 112 U/L (ref 38–126)
ALT: 69 U/L — AB (ref 14–54)
AST: 70 U/L — AB (ref 15–41)
Albumin: 2.8 g/dL — ABNORMAL LOW (ref 3.5–5.0)
Anion gap: 10 (ref 5–15)
BILIRUBIN TOTAL: 0.4 mg/dL (ref 0.3–1.2)
BUN: 14 mg/dL (ref 6–20)
CALCIUM: 8.7 mg/dL — AB (ref 8.9–10.3)
CHLORIDE: 96 mmol/L — AB (ref 101–111)
CO2: 28 mmol/L (ref 22–32)
CREATININE: 1.03 mg/dL — AB (ref 0.44–1.00)
GFR, EST AFRICAN AMERICAN: 59 mL/min — AB (ref >60)
GFR, EST NON AFRICAN AMERICAN: 51 mL/min — AB (ref >60)
Glucose, Bld: 129 mg/dL — ABNORMAL HIGH (ref 65–99)
Potassium: 3.9 mmol/L (ref 3.5–5.1)
Sodium: 134 mmol/L — ABNORMAL LOW (ref 135–145)
Total Protein: 7.4 g/dL (ref 6.5–8.1)

## 2015-07-29 MED ORDER — FLUTICASONE-SALMETEROL 250-50 MCG/DOSE IN AEPB: 1.0000 | INHALATION_SPRAY | Freq: Two times a day (BID) | RESPIRATORY_TRACT | Status: DC

## 2015-07-29 MED ORDER — PREDNISONE 5 MG PO TABS: ORAL_TABLET | ORAL | Status: DC

## 2015-07-29 NOTE — Progress Notes (Signed)
Patient continues to cough.  States sometimes when she is coughing hard everything goes Byanca Kasper but then she quickly recovers.  BP 99/73.  HR 106

## 2015-08-02 ENCOUNTER — Encounter: Payer: Self-pay | Admitting: Oncology

## 2015-08-02 NOTE — Progress Notes (Signed)
[place] @ [facility] [phone]  [phone]     Jenny Cooper OB: 19-Jun-1936  MR#: [id number]  YEM#:[id number]  Patient Care Team: Forest Gleason, MD as PCP - General (Oncology)  CHIEF COMPLAINT:  Chief Complaint  Patient presents with  . 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   Patient had compression of the right mainstem bronchus because of enlarged lymph node and a mass. Stage is T4 N1 M0 tumor stage IIIa diagnosis in January of 2016 2.started on radiation and chemotherapy from October 21, 2014 3.finished 6 cycles of carboplatinum and Taxol  in March  29 th of 2016, As finished concurrent chemoradiation therapy and PET scan shows significant response 4. started on consolidation chemotherapy with carboplatinum and Taxol 2 cycle   Patient finished 2 cycles of consultation therapy on March 06, 2015   5.  Atrial fibrillation diagnosis in August of 2016 on  eloquis     Oncology Flowsheet 02/05/2015 02/26/2015 03/05/2015 04/01/2015 04/02/2015 04/03/2015 04/04/2015  Day, Cycle Day 1, Cycle 2 Day 1, Cycle 1 Day 1, Cycle 1 - - - -  CARBOplatin (PARAPLATIN) IV - 390 mg - - - - -  dexamethasone (DECADRON) IV [ 20 mg ] [ 12 mg ] [ 20 mg ] - - - -  fosaprepitant (EMEND) IV - [ 150 mg ] - - - - -  methylPREDNISolone sodium succinate 40 mg/mL (SOLU-MEDROL) IV - - - 40 mg 40 mg 40 mg -  ondansetron (ZOFRAN) IJ - - - - - - -  ondansetron (ZOFRAN) IV [ 16 mg ] - [ 16 mg ] - - - -  PACLitaxel (TAXOL) IV 80 mg/m2 144 mg 144 mg - - - -  palonosetron (ALOXI) IV - 0.25 mg - - - - -  predniSONE (DELTASONE) PO - - - - - - 20 mg    INTERVAL HISTORY: 79 year old lady was finished chemoradiation therapy came today further follow-up regarding carcinoma of lung.   Patient was gradually improving however in  last few days started increasing cough yellowish expectoration.  Low-grade fever.  Patient is also making a trip to [place] extremely anxious. Shortness of breath unchanged.  No chest pain.  No hemoptysis. Patient came as an acute add-on   REVIEW OF SYSTEMS:    general status: Patient is feeling weak and tired.. Gradual improvement in the strength or pure of last few days  No change in a performance status.  No chills.  No fever. HEENT: Alopecia, resolving.  No evidence of stomatitis Lungs: Patient started increasing cough yellowish expectoration low-grade fever h Cardiac: No chest pain or paroxysmal nocturnal dyspnea GI: No nausea no vomiting no diarrhea no abdominal pain Skin: No rash Lower extremity no swelling Neurological system: Grade 1 neuropathy  No other focal signs  Musculoskeletal system no bony pains  As per HPI. Otherwise, a complete review of systems is negatve.  PAST MEDICAL HISTORY: Past Medical History  Diagnosis Date  . HTN (hypertension)   . Pacemaker     a. MDT 2002; b. generator replacement 2013; c. followed by Dr. Omelia Blackwater, MD  . HLD (hyperlipidemia)   . CAD (coronary artery disease)     a. s/p MI x 2 in 2002 s/p PCI x 2 in 2002; b. s/p 2v CABG 2002;  c. stress echo 07/2004 w/ evi of pos & inf infarct & no evi of ischemia; d. 4/08 dipyridamole scan w/ multiple areas of infarct, no ischemia, EF 49%; e. cath 04/28/15 3v CAD, med Rx rec, no targets for revasc, LM lum irregs, pLAD 30%, 100%, ost-pLCx 60%, [name] 99%, OM2 100%, p-mRCA 90%, [name] 100% L-R collats, VG-mLAD irregs, VG-OM2 oc  . Lung cancer ([place])   . History of colonoscopy 2013  . History of mammography, screening 2015  . History of Papanicolaou smear of cervix 2013  . Carcinoma of right lung ([place]) 01/03/2015    a. followed by Dr. Oliva Bustard  . Mitral regurgitation     a. s/p mitral ring placement 09/2000; b. echo 09/2010: EF 50%, inf HK, post AK, mild MR, prosthetic mitral valve ring w/ peak gradient of 10  mmHg; b. echo 2/13: EF 50%, mild MR/TR     . History of blood clots     12/2001  . Atypical atrial flutter ([place])     a. s/p ablation 07/27/2013  . PAF (paroxysmal atrial fibrillation) ([facility])     a. on Eliquis   . COPD (chronic obstructive pulmonary disease) ([place])   . Chronic systolic CHF (congestive heart failure) ([place])     a. echo 03/2015: EF 30-35%, sev ant/inf/pos HK, in mild to mod MR    PAST SURGICAL HISTORY: Past Surgical History  Procedure Laterality Date  . Appendectomy    . Vaginal hysterectomy    . Cardiac bypass    . Pacemaker insertion N/A   . Cardiac catheterization N/A 04/28/2015    Procedure: Left Heart Cath and Coronary Angiography;  Surgeon: Wellington Hampshire, MD;  Location: [place] [facility];  Service: Cardiovascular;  Laterality: N/A;    FAMILY HISTORY Family History  Problem Relation Age of Onset  . COPD Mother     sister, and brother        ADVANCED DIRECTIVES:Patient does not have any advanced healthcare directive. Information has been given. HEALTH MAINTENANCE: Social History  Substance Use Topics  . Smoking status: Former Smoker -- 2.00 packs/day for 42 years    Types: Cigarettes    Quit date: 08/30/2000  . Smokeless tobacco: None     Comment: quit smoking in 08/28/2000  . Alcohol Use: 6.0 oz/week    10 Glasses of wine per week     Comment: 2 glasses of wine per day     Allergies  Allergen Reactions  . Lovenox [Enoxaparin Sodium] Itching  . Meperidine Other (See Comments)    Other Reaction: pt does not like how it makes her feel    Current Outpatient Prescriptions  Medication Sig Dispense Refill  . apixaban (ELIQUIS) 5 MG TABS tablet Take 5 mg by mouth 2 (two) times daily.    Marland Kitchen atorvastatin (LIPITOR) 20 MG tablet Take 20 mg by mouth daily.    . calcium carbonate (OS-CAL) 600 MG TABS tablet Take 600 mg by mouth 2 (two) times daily with a meal.    . Cetirizine HCl 10 MG CAPS Take 1 capsule (10 mg total) by mouth 1 day or 1 dose. 30  capsule 5  . chlorpheniramine-HYDROcodone (TUSSIONEX PENNKINETIC ER) 10-8 MG/5ML SUER Take 5 mLs by mouth at bedtime as needed for cough. 140 mL 0  . ipratropium (ATROVENT) 0.03 % nasal spray Place 2 sprays into both nostrils 2 (two) times daily. 30 mL 5  . ipratropium-albuterol (DUONEB) 0.5-2.5 (3) MG/3ML SOLN TAKE 3ML BY NEBULIZER EVERY 6 HOURS AS NEEDED  2  . levothyroxine (SYNTHROID, LEVOTHROID) 75 MCG tablet Take 75 mcg by mouth daily before breakfast.    . metoprolol succinate (TOPROL XL) 25 MG 24 hr tablet Take 1 tablet (25 mg total) by mouth daily. Take 57m every morning and 572mevery evening 90 tablet 5  . omeprazole (PRILOSEC) 20 MG capsule Take 20 mg by mouth daily.    . predniSONE (DELTASONE) 5 MG tablet Start 5064maper by 5mg3mily until complete. 55 tablet 0  . sacubitril-valsartan (ENTRESTO) 24-26 MG Take 1 tablet by mouth 2 (two) times daily. 60 tablet 5  . Fluticasone-Salmeterol (ADVAIR DISKUS) 250-50 MCG/DOSE AEPB Inhale 1 puff into the lungs every 12 (twelve) hours. 60 each 0   No current facility-administered medications for this visit.   Facility-Administered Medications Ordered in Other Visits  Medication Dose Route Frequency Provider Last Rate Last Dose  . sodium chloride 0.9 % injection 10 mL  10 mL Intravenous PRN JanaForest Gleason   10 mL at 02/19/15 1000    OBJECTIVE:  Filed Vitals:   07/29/15 1106  BP: 99/73  Pulse: 106  Temp: 99.3 F (37.4 C)     Body mass index is 24.2 kg/(m^2).    ECOG FS:1 - Symptomatic but completely ambulatory  PHYSICAL EXAM: GENERAL:  Well developed, well nourished, sitting comfortably in the exam room in no acute distress. MENTAL STATUS:  Alert and oriented to person, place and time. HEAD: alopecia Normocephalic, atraumatic, face symmetric, no Cushingoid features. EYES: eyes.  Pupils equal round and reactive to light and accomodation.  No conjunctivitis or scleral icterus. ENT:  Oropharynx clear without lesion.  Tongue normal.  Mucous membranes moist.  RESPIRATORY:  Clear to auscultation without rales, wheezes or rhonchi. CARDIOVASCULAR:  Regular rate and rhythm without murmur, rub or gallop. . ABDOMEN:  Soft, non-tender, with active bowel sounds, and no hepatosplenomegaly.  No masses. BACK:  No CVA tenderness.  No tenderness on percussion of the back or rib cage. SKIN:  No rashes, ulcers or lesions. EXTREMITIES: No edema, no skin discoloration or tenderness.  No palpable cords. LYMPH NODES: No palpable cervical, supraclavicular, axillary or inguinal adenopathy  NEUROLOGICAL: Unremarkable. PSYCH:  Appropriate.   LAB RESULTS:  Appointment on 07/29/2015  Component Date Value Ref Range Status  . WBC 07/29/2015 6.0  3.6 - 11.0 K/uL Final  . RBC 07/29/2015 4.19  3.80 - 5.20 MIL/uL Final  . Hemoglobin 07/29/2015 11.0* 12.0 - 16.0 g/dL Final  . HCT 07/29/2015 33.7* 35.0 - 47.0 % Final  . MCV 07/29/2015 80.5  80.0 - 100.0 fL Final  . MCH 07/29/2015 26.2  26.0 - 34.0 pg Final  . MCHC 07/29/2015 32.5  32.0 - 36.0 g/dL Final  . RDW 07/29/2015 17.8* 11.5 - 14.5 % Final  . Platelets 07/29/2015 298  150 - 440 K/uL Final  . Neutrophils Relative % 07/29/2015 74   Final  . Neutro Abs 07/29/2015 4.4  1.4 - 6.5 K/uL Final  . Lymphocytes Relative 07/29/2015 10   Final  . Lymphs Abs 07/29/2015 0.6* 1.0 - 3.6 K/uL Final  . Monocytes Relative 07/29/2015 14   Final  . Monocytes Absolute 07/29/2015 0.8  0.2 - 0.9 K/uL Final  . Eosinophils Relative 07/29/2015 2   Final  . Eosinophils Absolute 07/29/2015 0.1  0 - 0.7 K/uL Final  . Basophils Relative 07/29/2015 0   Final  . Basophils Absolute 07/29/2015 0.0  0 - 0.1 K/uL Final  . Sodium 07/29/2015 134* 135 - 145 mmol/L Final  .  Potassium 07/29/2015 3.9  3.5 - 5.1 mmol/L Final  . Chloride 07/29/2015 96* 101 - 111 mmol/L Final  . CO2 07/29/2015 28  22 - 32 mmol/L Final  . Glucose, Bld 07/29/2015 129* 65 - 99 mg/dL Final  . BUN 07/29/2015 14  6 - 20 mg/dL Final  . Creatinine,  Ser 07/29/2015 1.03* 0.44 - 1.00 mg/dL Final  . Calcium 07/29/2015 8.7* 8.9 - 10.3 mg/dL Final  . Total Protein 07/29/2015 7.4  6.5 - 8.1 g/dL Final  . Albumin 07/29/2015 2.8* 3.5 - 5.0 g/dL Final  . AST 07/29/2015 70* 15 - 41 U/L Final  . ALT 07/29/2015 69* 14 - 54 U/L Final  . Alkaline Phosphatase 07/29/2015 112  38 - 126 U/L Final  . Total Bilirubin 07/29/2015 0.4  0.3 - 1.2 mg/dL Final  . GFR calc non Af Amer 07/29/2015 51* >60 mL/min Final  . GFR calc Af Amer 07/29/2015 59* >60 mL/min Final   Comment: (NOTE) The eGFR has been calculated using the CKD EPI equation. This calculation has not been validated in all clinical situations. eGFR's persistently <60 mL/min signify possible Chronic Kidney Disease.   . Anion gap 07/29/2015 10  5 - 15 Final   IMPRESSION: 1. Interval decrease in the central right lung lesion when comparing back to 09/25/2014, the last study with intravenous contrast material. 2. Interval near resolution of the patchy bilateral ground-glass attenuation seen previously. 3. Circumferential wall thickening in the midesophagus. This may be related to radiation therapy, but esophagitis could have this appearance. 4. Interval resolution of left pleural effusion with decrease in right pleural effusion. 5. Abdominal aortic atherosclerosis June 24, 2015  IMPRESSION: Slight interval increased constant acuity of the perihilar mass. There may be mild subsegmental postobstructive atelectasis in the right middle and inferior right upper lobe. There is no evidence of CHF.  Assessment and plan Stage III non-small cell carcinoma of lung..  Acute bronchitis.  Patient will be started on steroid and antibiotic. If there is no improvement patient will call me   June 24, 2015 Forest Gleason, MD   08/02/2015 9:58 AM

## 2015-08-18 ENCOUNTER — Ambulatory Visit (INDEPENDENT_AMBULATORY_CARE_PROVIDER_SITE_OTHER): Payer: Medicare Other | Admitting: Cardiovascular Disease

## 2015-08-18 ENCOUNTER — Encounter: Payer: Self-pay | Admitting: Cardiovascular Disease

## 2015-08-18 VITALS — BP 100/60 | HR 97 | Ht 67.0 in | Wt 153.0 lb

## 2015-08-18 DIAGNOSIS — I214 Non-ST elevation (NSTEMI) myocardial infarction: Secondary | ICD-10-CM | POA: Diagnosis not present

## 2015-08-18 DIAGNOSIS — J432 Centrilobular emphysema: Secondary | ICD-10-CM

## 2015-08-18 DIAGNOSIS — I5022 Chronic systolic (congestive) heart failure: Secondary | ICD-10-CM

## 2015-08-18 DIAGNOSIS — I480 Paroxysmal atrial fibrillation: Secondary | ICD-10-CM

## 2015-08-18 DIAGNOSIS — I2510 Atherosclerotic heart disease of native coronary artery without angina pectoris: Secondary | ICD-10-CM | POA: Diagnosis not present

## 2015-08-18 DIAGNOSIS — R05 Cough: Secondary | ICD-10-CM | POA: Diagnosis not present

## 2015-08-18 DIAGNOSIS — E785 Hyperlipidemia, unspecified: Secondary | ICD-10-CM

## 2015-08-18 DIAGNOSIS — I25810 Atherosclerosis of coronary artery bypass graft(s) without angina pectoris: Secondary | ICD-10-CM

## 2015-08-18 DIAGNOSIS — R059 Cough, unspecified: Secondary | ICD-10-CM

## 2015-08-18 DIAGNOSIS — R0602 Shortness of breath: Secondary | ICD-10-CM | POA: Diagnosis not present

## 2015-08-18 NOTE — Assessment & Plan Note (Signed)
Chronic coughing which she reports is debilitating, some sputum production. Likely chronic bronchitis, unable to exclude bronchiectasis. Previously seen by Dr. Stevenson Clinch. She is requesting referral to pulmonary given her symptoms

## 2015-08-18 NOTE — Assessment & Plan Note (Signed)
denies abdominal bloating, leg edema Given her severe lung disease, will be high risk of pulmonary hypertension as was seen on prior echocardiogram. We could consider starting low-dose Lasix daily for any leg edema, abdominal bloating. Consider repeat echocardiogram in follow-up to evaluate right heart pressures

## 2015-08-18 NOTE — Assessment & Plan Note (Signed)
Cardiac catheterization reviewed with the patient, dated August 2016 Medical management recommended

## 2015-08-18 NOTE — Assessment & Plan Note (Signed)
In atrial fibrillation on today's visit, rate relatively well-controlled. Discussed options for restoring normal sinus rhythm. Suspect her underlying lung condition is exacerbating her arrhythmia. We'll hold off on starting antiarrhythmic such as amiodarone. High concern of pulmonary toxicity potentially with little pulmonary reserve. Few other medications for antiarrhythmics that might work for her given underlying coronary artery disease, depressed ejection fraction. Suggest we work on her lungs first and reconsider restoring normal sinus rhythm in follow-up

## 2015-08-18 NOTE — Patient Instructions (Addendum)
You are doing well. No medication changes were made.  You are in atrial fibrillation today  We will place a referral to pulmonary for cough, bronchitis, Dr. Stevenson Clinch  Please call us if you have new issues that need to be addressed before your next appt.  Your physician wants you to follow-up in: 3 months.  You will receive a reminder letter in the mail two months in advance. If you don't receive a letter, please call our office to schedule the follow-up appointment.

## 2015-08-18 NOTE — Assessment & Plan Note (Signed)
Cholesterol is at goal on the current lipid regimen. No changes to the medications were made.  

## 2015-08-18 NOTE — Assessment & Plan Note (Signed)
Currently without symptoms of angina. Severe three-vessel disease on recent cardiac catheterization currentlyNonsmoker, nondiabetic, cholesterol at goal

## 2015-08-18 NOTE — Progress Notes (Signed)
Patient ID: Jenny Cooper, female    DOB: 1936/03/23, 79 y.o.   MRN: [id number]  HPI Comments: 79 y.o. female with h/o CAD s/p remote history of MI in 2002 s/p 2 vessel CABG post stenting in 2002, history of mitral valve repair in 2002 secondary to mitral regurgitation, history of PAF s/p prior TEE/DCCV on Eliquis, atypical atrial flutter s/p ablation on 07/27/2013 s/p MDT PPM, carcinoma of right lung, COPD, HTN, and HLD who presented to [facility] on 04/01/15 with increased SOB and was found to have PNA, possibly post obstructive and elevated troponin. She presents today to the clinic for follow-up of her atrial fibrillation  She reports having some fatigue, chronic cough, sputum production Cough has been worse recently, started approximately 2 years ago CT scan September 2016 showing improvement of her lung cancer  She finished her cancer treatment over the summer 2016, she had chemotherapy and radiation Denies any leg edema, no abdominal swelling, no chest pain symptoms  Declined EKG on today's visit, exam consistent with atrial fibrillation EKG on her last clinic visit showed atrial fibrillation September 2016 EKG August 2016 showed normal sinus rhythm  Other past medical history  TEE/DCCV in 2013 for her PAF. In 2014 she was diagnosed with atypical atrial flutter and underwent ablation. underwent PPM generator change in 2013.    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   finished concurrent chemoradiation with carboplatinum and Taxol and PET scan showed significant response.    present to [facility] on 8/2. She complained of increase SOB, cough that was productive of green to yellow sputum, nausea, and vomiting.   required BiPAP for a short time upon her arrival. CXR showed bibasilar airspace disease right greater than left has progressed significantly from the prior study, worrisome for recurrent carcinoma however pneumonia could also have this  appearance. CT chest has been ordered.   Troponin was found to be 0.48-->1.83.   Echo showed EF 30-35%, severe anterior and infero/posterior wall HK. Left ventricular function parameters were normal, mild to moderate MR. Left atrium was mildly dilated. RV systolic function was normal. Mild to moderate TR. PASP was moderately to severely elevated at 60 mm Hg.    outpatient cardiac cath on 04/28/2015 that showed 3 vessel CAD with patent SVG to LAD, occluded SVG to OM, native RCA had severe ISR in the mid-segment and was occluded distally at the sites of previously placed stents. There were left to right collaterals. Moderately reduced LVSF with EF of 35-40%. Mildly elevated LVEDP. There were no good targets for revascularization. medical therapy.   cardiac event monitor to evaluate her Afib burden that showed 50% Afib burden with a peak heart rate of 116, mostly rate controlled. Given this finding her Toprol was titrated up on 9/6 to 50 mg bid.      Allergies  Allergen Reactions  . Lovenox [Enoxaparin Sodium] Itching  . Meperidine Other (See Comments)    Other Reaction: pt does not like how it makes her feel    Current Outpatient Prescriptions on File Prior to Visit  Medication Sig Dispense Refill  . apixaban (ELIQUIS) 5 MG TABS tablet Take 5 mg by mouth 2 (two) times daily.    Marland Kitchen atorvastatin (LIPITOR) 20 MG tablet Take 20 mg by mouth daily.    . calcium carbonate (OS-CAL) 600 MG TABS tablet Take 600 mg by mouth 2 (two) times daily with a meal.    . Cetirizine HCl 10  MG CAPS Take 1 capsule (10 mg total) by mouth 1 day or 1 dose. 30 capsule 5  . chlorpheniramine-HYDROcodone (TUSSIONEX PENNKINETIC ER) 10-8 MG/5ML SUER Take 5 mLs by mouth at bedtime as needed for cough. 140 mL 0  . Fluticasone-Salmeterol (ADVAIR DISKUS) 250-50 MCG/DOSE AEPB Inhale 1 puff into the lungs every 12 (twelve) hours. 60 each 0  . ipratropium (ATROVENT) 0.03 % nasal spray Place 2 sprays into both nostrils 2 (two) times  daily. 30 mL 5  . ipratropium-albuterol (DUONEB) 0.5-2.5 (3) MG/3ML SOLN TAKE 3ML BY NEBULIZER EVERY 6 HOURS AS NEEDED  2  . levothyroxine (SYNTHROID, LEVOTHROID) 75 MCG tablet Take 75 mcg by mouth daily before breakfast.    . metoprolol succinate (TOPROL XL) 25 MG 24 hr tablet Take 1 tablet (25 mg total) by mouth daily. Take '25mg'$  every morning and '50mg'$  every evening 90 tablet 5  . omeprazole (PRILOSEC) 20 MG capsule Take 20 mg by mouth daily.    . predniSONE (DELTASONE) 5 MG tablet Start '50mg'$  taper by '5mg'$  daily until complete. 55 tablet 0  . sacubitril-valsartan (ENTRESTO) 24-26 MG Take 1 tablet by mouth 2 (two) times daily. 60 tablet 5   Current Facility-Administered Medications on File Prior to Visit  Medication Dose Route Frequency Provider Last Rate Last Dose  . sodium chloride 0.9 % injection 10 mL  10 mL Intravenous PRN Forest Gleason, MD   10 mL at 02/19/15 1000    Past Medical History  Diagnosis Date  . HTN (hypertension)   . Pacemaker     a. MDT 2002; b. generator replacement 2013; c. followed by Dr. Omelia Blackwater, MD  . HLD (hyperlipidemia)   . CAD (coronary artery disease)     a. s/p MI x 2 in 2002 s/p PCI x 2 in 2002; b. s/p 2v CABG 2002; c. stress echo 07/2004 w/ evi of pos & inf infarct & no evi of ischemia; d. 4/08 dipyridamole scan w/ multiple areas of infarct, no ischemia, EF 49%; e. cath 04/28/15 3v CAD, med Rx rec, no targets for revasc, LM lum irregs, pLAD 30%, 100%, ost-pLCx 60%, [name] 99%, OM2 100%, p-mRCA 90%, [name] 100% L-R collats, VG-mLAD irregs, VG-OM2 oc  . Lung cancer ([place])   . History of colonoscopy 2013  . History of mammography, screening 2015  . History of Papanicolaou smear of cervix 2013  . Carcinoma of right lung ([place]) 01/03/2015    a. followed by Dr. Oliva Bustard  . Mitral regurgitation     a. s/p mitral ring placement 09/2000; b. echo 09/2010: EF 50%, inf HK, post AK, mild MR, prosthetic mitral valve ring w/ peak gradient of 10 mmHg; b. echo 2/13: EF 50%, mild MR/TR      . History of blood clots     12/2001  . Atypical atrial flutter ([place])     a. s/p ablation 07/27/2013  . PAF (paroxysmal atrial fibrillation) ([facility])     a. on Eliquis   . COPD (chronic obstructive pulmonary disease) ([place])   . Chronic systolic CHF (congestive heart failure) ([place])     a. echo 03/2015: EF 30-35%, sev ant/inf/pos HK, in mild to mod MR    Past Surgical History  Procedure Laterality Date  . Appendectomy    . Vaginal hysterectomy    . Cardiac bypass    . Pacemaker insertion N/A   . Cardiac catheterization N/A 04/28/2015    Procedure: Left Heart Cath and Coronary Angiography;  Surgeon: Wellington Hampshire, MD;  Location: [facility]  INVASIVE CV LAB;  Service: Cardiovascular;  Laterality: N/A;    Social History  reports that she quit smoking about 14 years ago. Her smoking use included Cigarettes. She has a 84 pack-year smoking history. She does not have any smokeless tobacco history on file. She reports that she drinks about 6.0 oz of alcohol per week. She reports that she does not use illicit drugs.  Family History family history includes COPD in her mother.  Review of Systems  Constitutional: Positive for fatigue.  Respiratory: Positive for cough and shortness of breath.   Cardiovascular: Negative.   Gastrointestinal: Negative.   Musculoskeletal: Negative.   Neurological: Negative.   Hematological: Negative.   Psychiatric/Behavioral: Negative.   All other systems reviewed and are negative.  BP 100/60 mmHg  Pulse 97  Ht '5\' 7"'$  (1.702 m)  Wt 153 lb (69.4 kg)  BMI 23.96 kg/m2  Physical Exam  Constitutional: She is oriented to person, place, and time. She appears well-developed and well-nourished.  HENT:  Head: Normocephalic.  Nose: Nose normal.  Mouth/Throat: Oropharynx is clear and moist.  Eyes: Conjunctivae are normal. Pupils are equal, round, and reactive to light.  Neck: Normal range of motion. Neck supple. No JVD present.  Cardiovascular: Normal rate, regular  rhythm, normal heart sounds and intact distal pulses.  Exam reveals no gallop and no friction rub.   No murmur heard. Pulmonary/Chest: Effort normal. No respiratory distress. She has decreased breath sounds. She has no wheezes. She has rales. She exhibits no tenderness.  Abdominal: Soft. Bowel sounds are normal. She exhibits no distension. There is no tenderness.  Musculoskeletal: Normal range of motion. She exhibits no edema or tenderness.  Lymphadenopathy:    She has no cervical adenopathy.  Neurological: She is alert and oriented to person, place, and time. Coordination normal.  Skin: Skin is warm and dry. No rash noted. No erythema.  Psychiatric: She has a normal mood and affect. Her behavior is normal. Judgment and thought content normal.

## 2015-09-03 ENCOUNTER — Ambulatory Visit: Payer: Medicare Other | Admitting: Oncology

## 2015-09-03 ENCOUNTER — Other Ambulatory Visit: Payer: Medicare Other

## 2015-09-08 ENCOUNTER — Inpatient Hospital Stay: Payer: Medicare Other

## 2015-09-08 ENCOUNTER — Inpatient Hospital Stay: Payer: Medicare Other | Admitting: Oncology

## 2015-09-24 ENCOUNTER — Other Ambulatory Visit: Payer: Medicare Other

## 2015-09-24 ENCOUNTER — Ambulatory Visit: Payer: Medicare Other | Admitting: Oncology

## 2015-09-29 ENCOUNTER — Encounter: Payer: Self-pay | Admitting: Oncology

## 2015-09-29 ENCOUNTER — Inpatient Hospital Stay: Payer: Medicare Other | Attending: Oncology | Admitting: Oncology

## 2015-09-29 ENCOUNTER — Inpatient Hospital Stay: Payer: Medicare Other

## 2015-09-29 VITALS — BP 123/85 | HR 89 | Temp 97.9°F | Resp 18 | Wt 156.3 lb

## 2015-09-29 DIAGNOSIS — R059 Cough, unspecified: Secondary | ICD-10-CM

## 2015-09-29 DIAGNOSIS — E785 Hyperlipidemia, unspecified: Secondary | ICD-10-CM | POA: Diagnosis not present

## 2015-09-29 DIAGNOSIS — Z950 Presence of cardiac pacemaker: Secondary | ICD-10-CM | POA: Insufficient documentation

## 2015-09-29 DIAGNOSIS — Z87891 Personal history of nicotine dependence: Secondary | ICD-10-CM

## 2015-09-29 DIAGNOSIS — Z7952 Long term (current) use of systemic steroids: Secondary | ICD-10-CM | POA: Insufficient documentation

## 2015-09-29 DIAGNOSIS — C771 Secondary and unspecified malignant neoplasm of intrathoracic lymph nodes: Secondary | ICD-10-CM | POA: Diagnosis not present

## 2015-09-29 DIAGNOSIS — F419 Anxiety disorder, unspecified: Secondary | ICD-10-CM | POA: Insufficient documentation

## 2015-09-29 DIAGNOSIS — R05 Cough: Secondary | ICD-10-CM

## 2015-09-29 DIAGNOSIS — T451X5S Adverse effect of antineoplastic and immunosuppressive drugs, sequela: Secondary | ICD-10-CM

## 2015-09-29 DIAGNOSIS — C3491 Malignant neoplasm of unspecified part of right bronchus or lung: Secondary | ICD-10-CM

## 2015-09-29 DIAGNOSIS — I700 Atherosclerosis of aorta: Secondary | ICD-10-CM | POA: Insufficient documentation

## 2015-09-29 DIAGNOSIS — I5022 Chronic systolic (congestive) heart failure: Secondary | ICD-10-CM | POA: Insufficient documentation

## 2015-09-29 DIAGNOSIS — R5383 Other fatigue: Secondary | ICD-10-CM

## 2015-09-29 DIAGNOSIS — R531 Weakness: Secondary | ICD-10-CM

## 2015-09-29 DIAGNOSIS — Z9221 Personal history of antineoplastic chemotherapy: Secondary | ICD-10-CM | POA: Diagnosis not present

## 2015-09-29 DIAGNOSIS — I10 Essential (primary) hypertension: Secondary | ICD-10-CM | POA: Diagnosis not present

## 2015-09-29 DIAGNOSIS — J440 Chronic obstructive pulmonary disease with acute lower respiratory infection: Secondary | ICD-10-CM | POA: Diagnosis not present

## 2015-09-29 DIAGNOSIS — I480 Paroxysmal atrial fibrillation: Secondary | ICD-10-CM | POA: Diagnosis not present

## 2015-09-29 DIAGNOSIS — I2510 Atherosclerotic heart disease of native coronary artery without angina pectoris: Secondary | ICD-10-CM | POA: Insufficient documentation

## 2015-09-29 DIAGNOSIS — J209 Acute bronchitis, unspecified: Secondary | ICD-10-CM

## 2015-09-29 DIAGNOSIS — I484 Atypical atrial flutter: Secondary | ICD-10-CM

## 2015-09-29 DIAGNOSIS — Z7901 Long term (current) use of anticoagulants: Secondary | ICD-10-CM | POA: Insufficient documentation

## 2015-09-29 DIAGNOSIS — Z923 Personal history of irradiation: Secondary | ICD-10-CM

## 2015-09-29 DIAGNOSIS — G620 Drug-induced polyneuropathy: Secondary | ICD-10-CM

## 2015-09-29 DIAGNOSIS — Z79899 Other long term (current) drug therapy: Secondary | ICD-10-CM | POA: Insufficient documentation

## 2015-09-29 LAB — COMPREHENSIVE METABOLIC PANEL
ALBUMIN: 3.5 g/dL (ref 3.5–5.0)
ALK PHOS: 91 U/L (ref 38–126)
ALT: 11 U/L — AB (ref 14–54)
AST: 15 U/L (ref 15–41)
Anion gap: 6 (ref 5–15)
BILIRUBIN TOTAL: 0.9 mg/dL (ref 0.3–1.2)
BUN: 14 mg/dL (ref 6–20)
CALCIUM: 9 mg/dL (ref 8.9–10.3)
CO2: 29 mmol/L (ref 22–32)
CREATININE: 0.83 mg/dL (ref 0.44–1.00)
Chloride: 99 mmol/L — ABNORMAL LOW (ref 101–111)
GFR calc Af Amer: >60 mL/min (ref >60)
GLUCOSE: 128 mg/dL — AB (ref 65–99)
Potassium: 3.6 mmol/L (ref 3.5–5.1)
Sodium: 134 mmol/L — ABNORMAL LOW (ref 135–145)
TOTAL PROTEIN: 7.6 g/dL (ref 6.5–8.1)

## 2015-09-29 LAB — CBC WITH DIFFERENTIAL/PLATELET
BASOS ABS: 0 10*3/uL (ref 0–0.1)
BASOS PCT: 0 %
Eosinophils Absolute: 0.2 10*3/uL (ref 0–0.7)
Eosinophils Relative: 4 %
HEMATOCRIT: 37.5 % (ref 35.0–47.0)
HEMOGLOBIN: 12.4 g/dL (ref 12.0–16.0)
LYMPHS PCT: 12 %
Lymphs Abs: 0.8 10*3/uL — ABNORMAL LOW (ref 1.0–3.6)
MCH: 28 pg (ref 26.0–34.0)
MCHC: 33 g/dL (ref 32.0–36.0)
MCV: 84.7 fL (ref 80.0–100.0)
MONO ABS: 1 10*3/uL — AB (ref 0.2–0.9)
Monocytes Relative: 15 %
NEUTROS ABS: 4.6 10*3/uL (ref 1.4–6.5)
NEUTROS PCT: 69 %
Platelets: 244 10*3/uL (ref 150–440)
RBC: 4.43 MIL/uL (ref 3.80–5.20)
RDW: 20.9 % — AB (ref 11.5–14.5)
WBC: 6.6 10*3/uL (ref 3.6–11.0)

## 2015-09-29 NOTE — Progress Notes (Signed)
Patient states she continues to cough all the time.  She gets extemely SOB .  States she is also having problems with A-fib and will be reevaluated after her visit today and an appointment with Dr. Augustina Mood.

## 2015-09-29 NOTE — Progress Notes (Signed)
[place] @ [facility] Telephone:(336) [phone]  Fax:([phone]     Emelina Hinch OB: 18-May-1936  MR#: [id number]  LOV#:[id number]  Patient Care Team: Forest Gleason, MD as PCP - General (Oncology) Minna Merritts, MD as Consulting Physician (Cardiology)  CHIEF COMPLAINT:  Chief Complaint  Patient presents with  . 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   Patient had compression of the right mainstem bronchus because of enlarged lymph node and a mass. Stage is T4 N1 M0 tumor stage IIIa diagnosis in January of 2016 2.started on radiation and chemotherapy from October 21, 2014 3.finished 6 cycles of carboplatinum and Taxol  in March  29 th of 2016, As finished concurrent chemoradiation therapy and PET scan shows significant response 4. started on consolidation chemotherapy with carboplatinum and Taxol 2 cycle   Patient finished 2 cycles of consultation therapy on March 06, 2015   5.  Atrial fibrillation diagnosis in August of 2016 on  eloquis      INTERVAL HISTORY: 80 year old lady was finished chemoradiation therapy came today further follow-up regarding carcinoma of lung.   Patient was gradually improving however in last few days started increasing cough yellowish expectoration.  Low-grade fever.  Patient is also making a trip to [place] extremely anxious. Patient continues to have increasing shortness of breath and cough . Patient has been referred to pulmonologist for evaluation.  No hemoptysis.  No chest pain.  Appetite has been stable. Patient states she continues to cough all the time. She gets extemely SOB . States she is also having problems with A-fib and will be reevaluated after her visit today and an appointment with Dr. Augustina Mood     REVIEW OF SYSTEMS:    general status: Patient is  feeling weak and tired.. Gradual improvement in the strength or pure of last few days  No change in a performance status.  No chills.  No fever. HEENT: Alopecia, resolving.  No evidence of stomatitis Lungs: Patient started increasing cough , increasing shortness of breath Cardiac: No chest pain or paroxysmal nocturnal dyspnea GI: No nausea no vomiting no diarrhea no abdominal pain Skin: No rash Lower extremity no swelling Neurological system: Grade 1 neuropathy  No other focal signs  Musculoskeletal system no bony pains  As per HPI. Otherwise, a complete review of systems is negatve.  PAST MEDICAL HISTORY: Past Medical History  Diagnosis Date  . HTN (hypertension)   . Pacemaker     a. MDT 2002; b. generator replacement 2013; c. followed by Dr. Omelia Blackwater, MD  . HLD (hyperlipidemia)   . CAD (coronary artery disease)     a. s/p MI x 2 in 2002 s/p PCI x 2 in 2002; b. s/p 2v CABG 2002; c. stress echo 07/2004 w/ evi of pos & inf infarct & no evi of ischemia; d. 4/08 dipyridamole scan w/ multiple areas of infarct, no ischemia, EF 49%; e. cath 04/28/15 3v CAD, med Rx rec, no targets for revasc, LM lum irregs, pLAD 30%, 100%, ost-pLCx 60%, [name] 99%, OM2 100%, p-mRCA 90%, [name] 100% L-R collats, VG-mLAD irregs, VG-OM2 oc  . Lung cancer ([place])   . History of colonoscopy 2013  . History of mammography, screening 2015  . History of Papanicolaou smear of cervix 2013  . Carcinoma of right lung ([place]) 01/03/2015  a. followed by Dr. Oliva Bustard  . Mitral regurgitation     a. s/p mitral ring placement 09/2000; b. echo 09/2010: EF 50%, inf HK, post AK, mild MR, prosthetic mitral valve ring w/ peak gradient of 10 mmHg; b. echo 2/13: EF 50%, mild MR/TR     . History of blood clots     12/2001  . Atypical atrial flutter ([place])     a. s/p ablation 07/27/2013  . PAF (paroxysmal atrial fibrillation) ([facility])     a. on Eliquis   . COPD (chronic obstructive pulmonary disease) ([place])   . Chronic systolic CHF (congestive  heart failure) ([place])     a. echo 03/2015: EF 30-35%, sev ant/inf/pos HK, in mild to mod MR    PAST SURGICAL HISTORY: Past Surgical History  Procedure Laterality Date  . Appendectomy    . Vaginal hysterectomy    . Cardiac bypass    . Pacemaker insertion N/A   . Cardiac catheterization N/A 04/28/2015    Procedure: Left Heart Cath and Coronary Angiography;  Surgeon: Wellington Hampshire, MD;  Location: [place] [facility];  Service: Cardiovascular;  Laterality: N/A;    FAMILY HISTORY Family History  Problem Relation Age of Onset  . COPD Mother     sister, and brother        ADVANCED DIRECTIVES:Patient does not have any advanced healthcare directive. Information has been given. HEALTH MAINTENANCE: Social History  Substance Use Topics  . Smoking status: Former Smoker -- 2.00 packs/day for 42 years    Types: Cigarettes    Quit date: 08/30/2000  . Smokeless tobacco: None     Comment: quit smoking in 08/28/2000  . Alcohol Use: 6.0 oz/week    10 Glasses of wine per week     Comment: 2 glasses of wine per day     Allergies  Allergen Reactions  . Lovenox [Enoxaparin Sodium] Itching  . Meperidine Other (See Comments)    Other Reaction: pt does not like how it makes her feel    Current Outpatient Prescriptions  Medication Sig Dispense Refill  . apixaban (ELIQUIS) 5 MG TABS tablet Take 5 mg by mouth 2 (two) times daily.    Marland Kitchen atorvastatin (LIPITOR) 20 MG tablet Take 20 mg by mouth daily.    . calcium carbonate (OS-CAL) 600 MG TABS tablet Take 600 mg by mouth 2 (two) times daily with a meal.    . Cetirizine HCl 10 MG CAPS Take 1 capsule (10 mg total) by mouth 1 day or 1 dose. 30 capsule 5  . chlorpheniramine-HYDROcodone (TUSSIONEX PENNKINETIC ER) 10-8 MG/5ML SUER Take 5 mLs by mouth at bedtime as needed for cough. 140 mL 0  . Fluticasone-Salmeterol (ADVAIR DISKUS) 250-50 MCG/DOSE AEPB Inhale 1 puff into the lungs every 12 (twelve) hours. 60 each 0  . ipratropium (ATROVENT) 0.03 %  nasal spray Place 2 sprays into both nostrils 2 (two) times daily. 30 mL 5  . ipratropium-albuterol (DUONEB) 0.5-2.5 (3) MG/3ML SOLN TAKE 3ML BY NEBULIZER EVERY 6 HOURS AS NEEDED  2  . levothyroxine (SYNTHROID, LEVOTHROID) 75 MCG tablet Take 75 mcg by mouth daily before breakfast.    . metoprolol succinate (TOPROL XL) 25 MG 24 hr tablet Take 1 tablet (25 mg total) by mouth daily. Take 51m every morning and 556mevery evening 90 tablet 5  . omeprazole (PRILOSEC) 20 MG capsule Take 20 mg by mouth daily.    . predniSONE (DELTASONE) 5 MG tablet Start 5040maper by 5mg87mily until complete.  55 tablet 0  . sacubitril-valsartan (ENTRESTO) 24-26 MG Take 1 tablet by mouth 2 (two) times daily. 60 tablet 5   No current facility-administered medications for this visit.   Facility-Administered Medications Ordered in Other Visits  Medication Dose Route Frequency Provider Last Rate Last Dose  . sodium chloride 0.9 % injection 10 mL  10 mL Intravenous PRN Forest Gleason, MD   10 mL at 02/19/15 1000    OBJECTIVE:  Filed Vitals:   09/29/15 1216  BP: 123/85  Pulse: 89  Temp: 97.9 F (36.6 C)  Resp: 18     Body mass index is 24.48 kg/(m^2).    ECOG FS:1 - Symptomatic but completely ambulatory  PHYSICAL EXAM: GENERAL:  Well developed, well nourished, sitting comfortably in the exam room in no acute distress. MENTAL STATUS:  Alert and oriented to person, place and time. HEAD: alopecia Normocephalic, atraumatic, face symmetric, no Cushingoid features. EYES: eyes.  Pupils equal round and reactive to light and accomodation.  No conjunctivitis or scleral icterus. ENT:  Oropharynx clear without lesion.  Tongue normal. Mucous membranes moist.  RESPIRATORY:  Occasional rhonchi CARDIOVASCULAR:  Regular rate and rhythm without murmur, rub or gallop. . ABDOMEN:  Soft, non-tender, with active bowel sounds, and no hepatosplenomegaly.  No masses. BACK:  No CVA tenderness.  No tenderness on percussion of the back or  rib cage. SKIN:  No rashes, ulcers or lesions. EXTREMITIES: No edema, no skin discoloration or tenderness.  No palpable cords. LYMPH NODES: No palpable cervical, supraclavicular, axillary or inguinal adenopathy  NEUROLOGICAL: Unremarkable. PSYCH:  Appropriate.   LAB RESULTS:  Appointment on 09/29/2015  Component Date Value Ref Range Status  . WBC 09/29/2015 6.6  3.6 - 11.0 K/uL Final  . RBC 09/29/2015 4.43  3.80 - 5.20 MIL/uL Final  . Hemoglobin 09/29/2015 12.4  12.0 - 16.0 g/dL Final  . HCT 09/29/2015 37.5  35.0 - 47.0 % Final  . MCV 09/29/2015 84.7  80.0 - 100.0 fL Final  . MCH 09/29/2015 28.0  26.0 - 34.0 pg Final  . MCHC 09/29/2015 33.0  32.0 - 36.0 g/dL Final  . RDW 09/29/2015 20.9* 11.5 - 14.5 % Final  . Platelets 09/29/2015 244  150 - 440 K/uL Final  . Neutrophils Relative % 09/29/2015 69   Final  . Neutro Abs 09/29/2015 4.6  1.4 - 6.5 K/uL Final  . Lymphocytes Relative 09/29/2015 12   Final  . Lymphs Abs 09/29/2015 0.8* 1.0 - 3.6 K/uL Final  . Monocytes Relative 09/29/2015 15   Final  . Monocytes Absolute 09/29/2015 1.0* 0.2 - 0.9 K/uL Final  . Eosinophils Relative 09/29/2015 4   Final  . Eosinophils Absolute 09/29/2015 0.2  0 - 0.7 K/uL Final  . Basophils Relative 09/29/2015 0   Final  . Basophils Absolute 09/29/2015 0.0  0 - 0.1 K/uL Final  . Sodium 09/29/2015 134* 135 - 145 mmol/L Final  . Potassium 09/29/2015 3.6  3.5 - 5.1 mmol/L Final  . Chloride 09/29/2015 99* 101 - 111 mmol/L Final  . CO2 09/29/2015 29  22 - 32 mmol/L Final  . Glucose, Bld 09/29/2015 128* 65 - 99 mg/dL Final  . BUN 09/29/2015 14  6 - 20 mg/dL Final  . Creatinine, Ser 09/29/2015 0.83  0.44 - 1.00 mg/dL Final  . Calcium 09/29/2015 9.0  8.9 - 10.3 mg/dL Final  . Total Protein 09/29/2015 7.6  6.5 - 8.1 g/dL Final  . Albumin 09/29/2015 3.5  3.5 - 5.0 g/dL Final  . AST  09/29/2015 15  15 - 41 U/L Final  . ALT 09/29/2015 11* 14 - 54 U/L Final  . Alkaline Phosphatase 09/29/2015 91  38 - 126 U/L Final   . Total Bilirubin 09/29/2015 0.9  0.3 - 1.2 mg/dL Final  . GFR calc non Af Amer 09/29/2015 >60  >60 mL/min Final  . GFR calc Af Amer 09/29/2015 >60  >60 mL/min Final   Comment: (NOTE) The eGFR has been calculated using the CKD EPI equation. This calculation has not been validated in all clinical situations. eGFR's persistently <60 mL/min signify possible Chronic Kidney Disease.   . Anion gap 09/29/2015 6  5 - 15 Final   IMPRESSION: 1. Interval decrease in the central right lung lesion when comparing back to 09/25/2014, the last study with intravenous contrast material. 2. Interval near resolution of the patchy bilateral ground-glass attenuation seen previously. 3. Circumferential wall thickening in the midesophagus. This may be related to radiation therapy, but esophagitis could have this appearance. 4. Interval resolution of left pleural effusion with decrease in right pleural effusion. 5. Abdominal aortic atherosclerosis  F.  Assessment and plan Stage III non-small cell carcinoma of lung.Marland Kitchen  He continues to have increasing shortness of breath and cough Repeat PET scan for reevaluation regarding progressive disease If there is no progressive disease as stopped his by PET scan possibility of long-term steroid may be considered. We will await pulmonology's opinion  Acute bronchitis.  Patient will be started on steroid and antibiotic. If there is no improvement patient will call me    Forest Gleason, MD   09/29/2015 4:14 PM

## 2015-10-01 ENCOUNTER — Ambulatory Visit
Admission: RE | Admit: 2015-10-01 | Discharge: 2015-10-01 | Disposition: A | Discharge status: Home / Self Care | Payer: Medicare Other | Source: Ambulatory Visit | Attending: Oncology | Admitting: Oncology

## 2015-10-01 DIAGNOSIS — Z0189 Encounter for other specified special examinations: Secondary | ICD-10-CM | POA: Insufficient documentation

## 2015-10-01 DIAGNOSIS — Z923 Personal history of irradiation: Secondary | ICD-10-CM | POA: Diagnosis not present

## 2015-10-01 DIAGNOSIS — R05 Cough: Secondary | ICD-10-CM | POA: Insufficient documentation

## 2015-10-01 DIAGNOSIS — R590 Localized enlarged lymph nodes: Secondary | ICD-10-CM | POA: Insufficient documentation

## 2015-10-01 DIAGNOSIS — C3491 Malignant neoplasm of unspecified part of right bronchus or lung: Secondary | ICD-10-CM | POA: Diagnosis not present

## 2015-10-01 DIAGNOSIS — R918 Other nonspecific abnormal finding of lung field: Secondary | ICD-10-CM | POA: Diagnosis not present

## 2015-10-01 DIAGNOSIS — R059 Cough, unspecified: Secondary | ICD-10-CM

## 2015-10-01 LAB — GLUCOSE, CAPILLARY: Glucose-Capillary: 131 mg/dL — ABNORMAL HIGH (ref 65–99)

## 2015-10-01 MED ORDER — FLUDEOXYGLUCOSE F - 18 (FDG) INJECTION
12.0000 | Freq: Once | INTRAVENOUS | Status: AC | PRN
  Administered 2015-10-01: 12 via INTRAVENOUS

## 2015-10-02 ENCOUNTER — Encounter: Payer: Self-pay | Admitting: Oncology

## 2015-10-02 ENCOUNTER — Inpatient Hospital Stay: Payer: Medicare Other | Attending: Oncology | Admitting: Oncology

## 2015-10-02 ENCOUNTER — Telehealth: Payer: Self-pay

## 2015-10-02 VITALS — BP 106/68 | HR 102 | Temp 98.2°F | Resp 18 | Wt 156.1 lb

## 2015-10-02 DIAGNOSIS — L658 Other specified nonscarring hair loss: Secondary | ICD-10-CM | POA: Diagnosis not present

## 2015-10-02 DIAGNOSIS — R05 Cough: Secondary | ICD-10-CM | POA: Diagnosis not present

## 2015-10-02 DIAGNOSIS — I252 Old myocardial infarction: Secondary | ICD-10-CM | POA: Diagnosis not present

## 2015-10-02 DIAGNOSIS — Z9221 Personal history of antineoplastic chemotherapy: Secondary | ICD-10-CM | POA: Diagnosis not present

## 2015-10-02 DIAGNOSIS — T451X5S Adverse effect of antineoplastic and immunosuppressive drugs, sequela: Secondary | ICD-10-CM | POA: Diagnosis not present

## 2015-10-02 DIAGNOSIS — Z7901 Long term (current) use of anticoagulants: Secondary | ICD-10-CM | POA: Insufficient documentation

## 2015-10-02 DIAGNOSIS — G620 Drug-induced polyneuropathy: Secondary | ICD-10-CM | POA: Diagnosis not present

## 2015-10-02 DIAGNOSIS — Z7952 Long term (current) use of systemic steroids: Secondary | ICD-10-CM | POA: Diagnosis not present

## 2015-10-02 DIAGNOSIS — Z87891 Personal history of nicotine dependence: Secondary | ICD-10-CM | POA: Diagnosis not present

## 2015-10-02 DIAGNOSIS — Z951 Presence of aortocoronary bypass graft: Secondary | ICD-10-CM | POA: Insufficient documentation

## 2015-10-02 DIAGNOSIS — R531 Weakness: Secondary | ICD-10-CM | POA: Insufficient documentation

## 2015-10-02 DIAGNOSIS — R5383 Other fatigue: Secondary | ICD-10-CM | POA: Insufficient documentation

## 2015-10-02 DIAGNOSIS — F419 Anxiety disorder, unspecified: Secondary | ICD-10-CM | POA: Insufficient documentation

## 2015-10-02 DIAGNOSIS — R509 Fever, unspecified: Secondary | ICD-10-CM | POA: Diagnosis not present

## 2015-10-02 DIAGNOSIS — Z9861 Coronary angioplasty status: Secondary | ICD-10-CM | POA: Insufficient documentation

## 2015-10-02 DIAGNOSIS — Z950 Presence of cardiac pacemaker: Secondary | ICD-10-CM | POA: Insufficient documentation

## 2015-10-02 DIAGNOSIS — I2510 Atherosclerotic heart disease of native coronary artery without angina pectoris: Secondary | ICD-10-CM | POA: Diagnosis not present

## 2015-10-02 DIAGNOSIS — Z79899 Other long term (current) drug therapy: Secondary | ICD-10-CM | POA: Diagnosis not present

## 2015-10-02 DIAGNOSIS — C771 Secondary and unspecified malignant neoplasm of intrathoracic lymph nodes: Secondary | ICD-10-CM | POA: Diagnosis not present

## 2015-10-02 DIAGNOSIS — I5022 Chronic systolic (congestive) heart failure: Secondary | ICD-10-CM | POA: Insufficient documentation

## 2015-10-02 DIAGNOSIS — J449 Chronic obstructive pulmonary disease, unspecified: Secondary | ICD-10-CM | POA: Insufficient documentation

## 2015-10-02 DIAGNOSIS — J440 Chronic obstructive pulmonary disease with acute lower respiratory infection: Secondary | ICD-10-CM | POA: Insufficient documentation

## 2015-10-02 DIAGNOSIS — Z923 Personal history of irradiation: Secondary | ICD-10-CM | POA: Diagnosis not present

## 2015-10-02 DIAGNOSIS — C3401 Malignant neoplasm of right main bronchus: Secondary | ICD-10-CM | POA: Diagnosis present

## 2015-10-02 DIAGNOSIS — I10 Essential (primary) hypertension: Secondary | ICD-10-CM | POA: Insufficient documentation

## 2015-10-02 DIAGNOSIS — I480 Paroxysmal atrial fibrillation: Secondary | ICD-10-CM | POA: Diagnosis not present

## 2015-10-02 DIAGNOSIS — R093 Abnormal sputum: Secondary | ICD-10-CM | POA: Diagnosis not present

## 2015-10-02 DIAGNOSIS — Z952 Presence of prosthetic heart valve: Secondary | ICD-10-CM | POA: Diagnosis not present

## 2015-10-02 DIAGNOSIS — E785 Hyperlipidemia, unspecified: Secondary | ICD-10-CM | POA: Insufficient documentation

## 2015-10-02 DIAGNOSIS — C3491 Malignant neoplasm of unspecified part of right bronchus or lung: Secondary | ICD-10-CM

## 2015-10-02 MED ORDER — DOXYCYCLINE HYCLATE 100 MG PO TABS: 100.0000 mg | ORAL_TABLET | Freq: Two times a day (BID) | ORAL | Status: DC

## 2015-10-02 MED ORDER — PREDNISONE 5 MG PO TABS: ORAL_TABLET | ORAL | Status: DC

## 2015-10-02 MED ORDER — GUAIFENESIN ER 600 MG PO TB12: 600.0000 mg | ORAL_TABLET | Freq: Two times a day (BID) | ORAL | Status: DC

## 2015-10-02 NOTE — Telephone Encounter (Signed)
Misty in Pulmonary called for Dr. Mortimer Fries stating pt having bronch Monday morning. Please have MD advise regarding Eliquis. At last OV, pt in afib. Per verbal from Dr. Rockey Situ, pt needs to take Eliquis through Friday evening. Hold Saturday until after procedure. Have Kasa advise restart date. Advised Misty of [place]'s recommendations who states Dr. Mortimer Fries wants pt off Eliquis sooner.  Misty will speak directly with Dr. Rockey Situ.

## 2015-10-02 NOTE — Progress Notes (Signed)
[place] @ [facility] Telephone:(336) [phone]  Fax:(336) [phone]     Shayne Deerman OB: Oct 29, 1935  MR#: [id number]  JGG#:[id number]  Patient Care Team: Forest Gleason, MD as PCP - General (Oncology) Minna Merritts, MD as Consulting Physician (Cardiology) Vilinda Boehringer, MD as Consulting Physician (Internal Medicine)  CHIEF COMPLAINT:  Chief Complaint  Patient presents with  . 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   Patient had compression of the right mainstem bronchus because of enlarged lymph node and a mass. Stage is T4 N1 M0 tumor stage IIIa diagnosis in January of 2016 2.started on radiation and chemotherapy from October 21, 2014 3.finished 6 cycles of carboplatinum and Taxol  in March  29 th of 2016, As finished concurrent chemoradiation therapy and PET scan shows significant response 4. started on consolidation chemotherapy with carboplatinum and Taxol 2 cycle   Patient finished 2 cycles of consultation therapy on March 06, 2015   5.  Atrial fibrillation diagnosis in August of 2016 on  eloquis      INTERVAL HISTORY: 80 year old lady was finished chemoradiation therapy came today further follow-up regarding carcinoma of lung.   Patient was gradually improving however in last few days started increasing cough yellowish expectoration.  Low-grade fever.  Patient is also making a trip to [place] extremely anxious. Patient continues to have increasing shortness of breath and cough . Patient has been referred to pulmonologist for evaluation.  No hemoptysis.  No chest pain.  Appetite has been stable. Patient states she continues to cough all the time. She gets extemely SOB . States she is also having problems with A-fib and will be reevaluated after her visit today and an appointment with Dr.  Augustina Mood   Patient is here for ongoing evaluation and had a PET scan done continues to shortness of breath. Cough with occasional yellowish expectoration  REVIEW OF SYSTEMS:    general status: Patient is feeling weak and tired.. Gradual improvement in the strength or pure of last few days  No change in a performance status.  No chills.  No fever. HEENT: Alopecia, resolving.  No evidence of stomatitis Lungs: Patient started increasing cough , increasing shortness of breath Cardiac: No chest pain or paroxysmal nocturnal dyspnea GI: No nausea no vomiting no diarrhea no abdominal pain Skin: No rash Lower extremity no swelling Neurological system: Grade 1 neuropathy  No other focal signs  Musculoskeletal system no bony pains  As per HPI. Otherwise, a complete review of systems is negatve.  PAST MEDICAL HISTORY: Past Medical History  Diagnosis Date  . HTN (hypertension)   . Pacemaker     a. MDT 2002; b. generator replacement 2013; c. followed by Dr. Omelia Blackwater, MD  . HLD (hyperlipidemia)   . CAD (coronary artery disease)     a. s/p MI x 2 in 2002 s/p PCI x 2 in 2002; b. s/p 2v CABG 2002; c. stress echo 07/2004 w/ evi of pos & inf infarct & no evi of ischemia; d. 4/08 dipyridamole scan w/ multiple areas of infarct, no ischemia, EF 49%; e. cath 04/28/15 3v CAD, med Rx rec, no targets for revasc, LM lum irregs, pLAD 30%, 100%, ost-pLCx 60%, [name] 99%, OM2 100%, p-mRCA 90%, [name] 100% L-R collats, VG-mLAD irregs, VG-OM2 oc  . Lung cancer ([place])   .  History of colonoscopy 2013  . History of mammography, screening 2015  . History of Papanicolaou smear of cervix 2013  . Carcinoma of right lung ([place]) 01/03/2015    a. followed by Dr. Oliva Bustard  . Mitral regurgitation     a. s/p mitral ring placement 09/2000; b. echo 09/2010: EF 50%, inf HK, post AK, mild MR, prosthetic mitral valve ring w/ peak gradient of 10 mmHg; b. echo 2/13: EF 50%, mild MR/TR     . History of blood clots     12/2001  . Atypical atrial  flutter ([place])     a. s/p ablation 07/27/2013  . PAF (paroxysmal atrial fibrillation) ([facility])     a. on Eliquis   . COPD (chronic obstructive pulmonary disease) ([place])   . Chronic systolic CHF (congestive heart failure) ([place])     a. echo 03/2015: EF 30-35%, sev ant/inf/pos HK, in mild to mod MR    PAST SURGICAL HISTORY: Past Surgical History  Procedure Laterality Date  . Appendectomy    . Vaginal hysterectomy    . Cardiac bypass    . Pacemaker insertion N/A   . Cardiac catheterization N/A 04/28/2015    Procedure: Left Heart Cath and Coronary Angiography;  Surgeon: Wellington Hampshire, MD;  Location: [place] [facility];  Service: Cardiovascular;  Laterality: N/A;    FAMILY HISTORY Family History  Problem Relation Age of Onset  . COPD Mother     sister, and brother        ADVANCED DIRECTIVES:Patient does not have any advanced healthcare directive. Information has been given. HEALTH MAINTENANCE: Social History  Substance Use Topics  . Smoking status: Former Smoker -- 2.00 packs/day for 42 years    Types: Cigarettes    Quit date: 08/30/2000  . Smokeless tobacco: None     Comment: quit smoking in 08/28/2000  . Alcohol Use: 6.0 oz/week    10 Glasses of wine per week     Comment: 2 glasses of wine per day     Allergies  Allergen Reactions  . Lovenox [Enoxaparin Sodium] Itching  . Meperidine Other (See Comments)    Other Reaction: pt does not like how it makes her feel    Current Outpatient Prescriptions  Medication Sig Dispense Refill  . apixaban (ELIQUIS) 5 MG TABS tablet Take 5 mg by mouth 2 (two) times daily.    Marland Kitchen atorvastatin (LIPITOR) 20 MG tablet Take 20 mg by mouth daily.    . calcium carbonate (OS-CAL) 600 MG TABS tablet Take 600 mg by mouth 2 (two) times daily with a meal.    . Cetirizine HCl 10 MG CAPS Take 1 capsule (10 mg total) by mouth 1 day or 1 dose. 30 capsule 5  . chlorpheniramine-HYDROcodone (TUSSIONEX PENNKINETIC ER) 10-8 MG/5ML SUER Take 5 mLs by  mouth at bedtime as needed for cough. 140 mL 0  . Fluticasone-Salmeterol (ADVAIR DISKUS) 250-50 MCG/DOSE AEPB Inhale 1 puff into the lungs every 12 (twelve) hours. 60 each 0  . ipratropium (ATROVENT) 0.03 % nasal spray Place 2 sprays into both nostrils 2 (two) times daily. 30 mL 5  . ipratropium-albuterol (DUONEB) 0.5-2.5 (3) MG/3ML SOLN TAKE 3ML BY NEBULIZER EVERY 6 HOURS AS NEEDED  2  . levothyroxine (SYNTHROID, LEVOTHROID) 75 MCG tablet Take 75 mcg by mouth daily before breakfast.    . metoprolol succinate (TOPROL XL) 25 MG 24 hr tablet Take 1 tablet (25 mg total) by mouth daily. Take '25mg'$  every morning and '50mg'$  every evening 90 tablet  5  . omeprazole (PRILOSEC) 20 MG capsule Take 20 mg by mouth daily.    . predniSONE (DELTASONE) 5 MG tablet Start '50mg'$  taper by '5mg'$  daily until complete. 55 tablet 0  . sacubitril-valsartan (ENTRESTO) 24-26 MG Take 1 tablet by mouth 2 (two) times daily. 60 tablet 5   No current facility-administered medications for this visit.   Facility-Administered Medications Ordered in Other Visits  Medication Dose Route Frequency Provider Last Rate Last Dose  . sodium chloride 0.9 % injection 10 mL  10 mL Intravenous PRN Forest Gleason, MD   10 mL at 02/19/15 1000    OBJECTIVE:  Filed Vitals:   10/02/15 0828  BP: 106/68  Pulse: 102  Temp: 98.2 F (36.8 C)  Resp: 18     Body mass index is 24.44 kg/(m^2).    ECOG FS:1 - Symptomatic but completely ambulatory  PHYSICAL EXAM: GENERAL:  Well developed, well nourished, sitting comfortably in the exam room in no acute distress. MENTAL STATUS:  Alert and oriented to person, place and time. HEAD: alopecia Normocephalic, atraumatic, face symmetric, no Cushingoid features. EYES: eyes.  Pupils equal round and reactive to light and accomodation.  No conjunctivitis or scleral icterus. ENT:  Oropharynx clear without lesion.  Tongue normal. Mucous membranes moist.  RESPIRATORY:  Occasional rhonchi CARDIOVASCULAR:  Regular rate  and rhythm without murmur, rub or gallop. . ABDOMEN:  Soft, non-tender, with active bowel sounds, and no hepatosplenomegaly.  No masses. BACK:  No CVA tenderness.  No tenderness on percussion of the back or rib cage. SKIN:  No rashes, ulcers or lesions. EXTREMITIES: No edema, no skin discoloration or tenderness.  No palpable cords. LYMPH NODES: No palpable cervical, supraclavicular, axillary or inguinal adenopathy  NEUROLOGICAL: Unremarkable. PSYCH:  Appropriate.   LAB RESULTS:  Hospital Outpatient Visit on 10/01/2015  Component Date Value Ref Range Status  . Glucose-Capillary 10/01/2015 131* 65 - 99 mg/dL Final   PET scan has been reviewed independently Lab data has been reviewed.  Assessment and plan Stage III non-small cell carcinoma of lung.Marland Kitchen  PET scan shows multiple pulmonary patchy infiltrate.  Atypical infection versus progressing cancer  We start patient on prednisone 60 mg taper by 5 mg to 0 Doxycycline 100 mg twice a day for 10 days Consult Dr. Stevenson Clinch  for possible bronchoscopy. mucinex   600 mg by mouth twice a day Reevaluate in 2 weeks    Acute bronchitis.  Patient will be started on steroid and antibiotic. If there is no improvement patient will call me    Forest Gleason, MD   10/02/2015 8:47 AM

## 2015-10-02 NOTE — Progress Notes (Signed)
Patient here today for PET results. 

## 2015-10-03 ENCOUNTER — Encounter: Payer: Self-pay | Admitting: *Deleted

## 2015-10-03 ENCOUNTER — Ambulatory Visit
Admission: RE | Admit: 2015-10-03 | Discharge: 2015-10-03 | Disposition: A | Discharge status: Home / Self Care | Payer: Medicare Other | Source: Ambulatory Visit | Attending: Oncology | Admitting: Oncology

## 2015-10-03 ENCOUNTER — Other Ambulatory Visit: Payer: Self-pay | Admitting: Oncology

## 2015-10-03 ENCOUNTER — Other Ambulatory Visit: Payer: Medicare Other | Admitting: *Deleted

## 2015-10-03 DIAGNOSIS — C3490 Malignant neoplasm of unspecified part of unspecified bronchus or lung: Secondary | ICD-10-CM

## 2015-10-03 NOTE — Pre-Procedure Instructions (Signed)
HPI Comments: 80 y.o. female with h/o CAD s/p remote history of MI in 2002 s/p 2 vessel CABG post stenting in 2002, history of mitral valve repair in 2002 secondary to mitral regurgitation, history of PAF s/p prior TEE/DCCV on Eliquis, atypical atrial flutter s/p ablation on 07/27/2013 s/p MDT PPM, carcinoma of right lung, COPD, HTN, and HLD who presented to [facility] on 04/01/15 with increased SOB and was found to have PNA, possibly post obstructive and elevated troponin. She presents today to the clinic for follow-up of her atrial fibrillation  She reports having some fatigue, chronic cough, sputum production Cough has been worse recently, started approximately 2 years ago CT scan September 2016 showing improvement of her lung cancer  She finished her cancer treatment over the summer 2016, she had chemotherapy and radiation Denies any leg edema, no abdominal swelling, no chest pain symptoms  Declined EKG on today's visit, exam consistent with atrial fibrillation EKG on her last clinic visit showed atrial fibrillation September 2016 EKG August 2016 showed normal sinus rhythm  Other past medical history  TEE/DCCV in 2013 for her PAF. In 2014 she was diagnosed with atypical atrial flutter and underwent ablation. underwent PPM generator change in 2013.   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  finished concurrent chemoradiation with carboplatinum and Taxol and PET scan showed significant response.   present to [facility] on 8/2. She complained of increase SOB, cough that was productive of green to yellow sputum, nausea, and vomiting.  required BiPAP for a short time upon her arrival. CXR showed bibasilar airspace disease right greater than left has progressed significantly from the prior study, worrisome for recurrent carcinoma however pneumonia could also have this appearance. CT chest has been ordered.   Troponin was found to be 0.48-->1.83.  Echo showed  EF 30-35%, severe anterior and infero/posterior wall HK. Left ventricular function parameters were normal, mild to moderate MR. Left atrium was mildly dilated. RV systolic function was normal. Mild to moderate TR. PASP was moderately to severely elevated at 60 mm Hg.   outpatient cardiac cath on 04/28/2015 that showed 3 vessel CAD with patent SVG to LAD, occluded SVG to OM, native RCA had severe ISR in the mid-segment and was occluded distally at the sites of previously placed stents. There were left to right collaterals. Moderately reduced LVSF with EF of 35-40%. Mildly elevated LVEDP. There were no good targets for revascularization. medical therapy.   cardiac event monitor to evaluate her Afib burden that showed 50% Afib burden with a peak heart rate of 116, mostly rate controlled. Given this finding her Toprol was titrated up on 9/6 to 50 mg bid.     Allergies  Allergen Reactions  . Lovenox [Enoxaparin Sodium] Itching  . Meperidine Other (See Comments)    Other Reaction: pt does not like how it makes her feel    Current Outpatient Prescriptions on File Prior to Visit  Medication Sig Dispense Refill  . apixaban (ELIQUIS) 5 MG TABS tablet Take 5 mg by mouth 2 (two) times daily.    Marland Kitchen atorvastatin (LIPITOR) 20 MG tablet Take 20 mg by mouth daily.    . calcium carbonate (OS-CAL) 600 MG TABS tablet Take 600 mg by mouth 2 (two) times daily with a meal.    . Cetirizine HCl 10 MG CAPS Take 1 capsule (10 mg total) by mouth 1 day or 1 dose. 30 capsule 5  . chlorpheniramine-HYDROcodone (TUSSIONEX PENNKINETIC ER) 10-8 MG/5ML  SUER Take 5 mLs by mouth at bedtime as needed for cough. 140 mL 0  . Fluticasone-Salmeterol (ADVAIR DISKUS) 250-50 MCG/DOSE AEPB Inhale 1 puff into the lungs every 12 (twelve) hours. 60 each 0  . ipratropium (ATROVENT) 0.03 % nasal spray Place 2 sprays into both nostrils 2 (two) times daily. 30 mL 5  . ipratropium-albuterol  (DUONEB) 0.5-2.5 (3) MG/3ML SOLN TAKE 3ML BY NEBULIZER EVERY 6 HOURS AS NEEDED  2  . levothyroxine (SYNTHROID, LEVOTHROID) 75 MCG tablet Take 75 mcg by mouth daily before breakfast.    . metoprolol succinate (TOPROL XL) 25 MG 24 hr tablet Take 1 tablet (25 mg total) by mouth daily. Take '25mg'$  every morning and '50mg'$  every evening 90 tablet 5  . omeprazole (PRILOSEC) 20 MG capsule Take 20 mg by mouth daily.    . predniSONE (DELTASONE) 5 MG tablet Start '50mg'$  taper by '5mg'$  daily until complete. 55 tablet 0  . sacubitril-valsartan (ENTRESTO) 24-26 MG Take 1 tablet by mouth 2 (two) times daily. 60 tablet 5   Current Facility-Administered Medications on File Prior to Visit  Medication Dose Route Frequency Provider Last Rate Last Dose  . sodium chloride 0.9 % injection 10 mL 10 mL Intravenous PRN Forest Gleason, MD  10 mL at 02/19/15 1000    Past Medical History  Diagnosis Date  . HTN (hypertension)   . Pacemaker     a. MDT 2002; b. generator replacement 2013; c. followed by Dr. Omelia Blackwater, MD  . HLD (hyperlipidemia)   . CAD (coronary artery disease)     a. s/p MI x 2 in 2002 s/p PCI x 2 in 2002; b. s/p 2v CABG 2002; c. stress echo 07/2004 w/ evi of pos & inf infarct & no evi of ischemia; d. 4/08 dipyridamole scan w/ multiple areas of infarct, no ischemia, EF 49%; e. cath 04/28/15 3v CAD, med Rx rec, no targets for revasc, LM lum irregs, pLAD 30%, 100%, ost-pLCx 60%, [name] 99%, OM2 100%, p-mRCA 90%, [name] 100% L-R collats, VG-mLAD irregs, VG-OM2 oc  . Lung cancer ([place])   . History of colonoscopy 2013  . History of mammography, screening 2015  . History of Papanicolaou smear of cervix 2013  . Carcinoma of right lung ([place]) 01/03/2015    a. followed by Dr. Oliva Bustard  . Mitral regurgitation     a. s/p mitral ring placement 09/2000; b. echo 09/2010: EF 50%, inf HK, post AK, mild MR, prosthetic mitral valve ring w/ peak  gradient of 10 mmHg; b. echo 2/13: EF 50%, mild MR/TR   . History of blood clots     12/2001  . Atypical atrial flutter ([place])     a. s/p ablation 07/27/2013  . PAF (paroxysmal atrial fibrillation) ([facility])     a. on Eliquis   . COPD (chronic obstructive pulmonary disease) ([place])   . Chronic systolic CHF (congestive heart failure) ([place])     a. echo 03/2015: EF 30-35%, sev ant/inf/pos HK, in mild to mod MR    Past Surgical History  Procedure Laterality Date  . Appendectomy    . Vaginal hysterectomy    . Cardiac bypass    . Pacemaker insertion N/A   . Cardiac catheterization N/A 04/28/2015    Procedure: Left Heart Cath and Coronary Angiography; Surgeon: Wellington Hampshire, MD; Location: [place] [facility]; Service: Cardiovascular; Laterality: N/A;    Social History  reports that she quit smoking about 14 years ago. Her smoking use included Cigarettes. She has a 84 pack-year  smoking history. She does not have any smokeless tobacco history on file. She reports that she drinks about 6.0 oz of alcohol per week. She reports that she does not use illicit drugs.  Family History family history includes COPD in her mother.  Review of Systems  Constitutional: Positive for fatigue.  Respiratory: Positive for cough and shortness of breath.  Cardiovascular: Negative.  Gastrointestinal: Negative.  Musculoskeletal: Negative.  Neurological: Negative.  Hematological: Negative.  Psychiatric/Behavioral: Negative.  All other systems reviewed and are negative.  BP 100/60 mmHg  Pulse 97  Ht '5\' 7"'$  (1.702 m)  Wt 153 lb (69.4 kg)  BMI 23.96 kg/m2  Physical Exam  Constitutional: She is oriented to person, place, and time. She appears well-developed and well-nourished.  HENT:  Head: Normocephalic.  Nose: Nose normal.  Mouth/Throat: Oropharynx is clear and moist.  Eyes: Conjunctivae are normal. Pupils are equal, round, and reactive  to light.  Neck: Normal range of motion. Neck supple. No JVD present.  Cardiovascular: Normal rate, regular rhythm, normal heart sounds and intact distal pulses. Exam reveals no gallop and no friction rub.  No murmur heard. Pulmonary/Chest: Effort normal. No respiratory distress. She has decreased breath sounds. She has no wheezes. She has rales. She exhibits no tenderness.  Abdominal: Soft. Bowel sounds are normal. She exhibits no distension. There is no tenderness.  Musculoskeletal: Normal range of motion. She exhibits no edema or tenderness.  Lymphadenopathy:   She has no cervical adenopathy.  Neurological: She is alert and oriented to person, place, and time. Coordination normal.  Skin: Skin is warm and dry. No rash noted. No erythema.  Psychiatric: She has a normal mood and affect. Her behavior is normal. Judgment and thought content normal.

## 2015-10-03 NOTE — Patient Instructions (Signed)
  Your procedure is scheduled on: 10-06-15 Report to [place] @ 8:30-PT NOTIFIED OF TIME   Remember: Instructions that are not followed completely may result in serious medical risk, up to and including death, or upon the discretion of your surgeon and anesthesiologist your surgery may need to be rescheduled.    _X___ 1. Do not eat food or drink liquids after midnight. No gum chewing or hard candies.     _X___ 2. No Alcohol for 24 hours before or after surgery.   ____ 3. Bring all medications with you on the day of surgery if instructed.    ____ 4. Notify your doctor if there is any change in your medical condition     (cold, fever, infections).     Do not wear jewelry, make-up, hairpins, clips or nail polish.  Do not wear lotions, powders, or perfumes. You may wear deodorant.  Do not shave 48 hours prior to surgery. Men may shave face and neck.  Do not bring valuables to the hospital.    [facility] is not responsible for any belongings or valuables.               Contacts, dentures or bridgework may not be worn into surgery.  Leave your suitcase in the car. After surgery it may be brought to your room.  For patients admitted to the hospital, discharge time is determined by your  treatment team.   Patients discharged the day of surgery will not be allowed to drive home.   Please read over the following fact sheets that you were given:      _X___ Take these medicines the morning of surgery with A SIP OF WATER:    1. LEVOTHYROXINE  2. PREDNISONE  3. METOPROLOL  4. ENTRESTO  5. PRILOSEC  6. TAKE AN EXTRA PRILOSEC Sunday NIGHT BEFORE BED  ____ Fleet Enema (as directed)   ____ Use CHG Soap as directed  _X___ Use inhalers on the day of surgery-USE NEBULIZER AT [place]   ____ Stop metformin 2 days prior to surgery    ____ Take 1/2 of usual insulin dose the night before surgery and none on the morning of surgery.   _X___  Stop Coumadin/Plavix/aspirin-PT INFORMED BY OFFICE TO STOP ELOQUIS Friday-PT TOOK AM DOSE ON 10-03-15 BUT TOOK NO MORE PER PT  ____ Stop Anti-inflammatories-NO NSAIDS OR ASA PRODUCTS-TYLENOL OK TO TAKE   ____ Stop supplements until after surgery.    ____ Bring C-Pap to the hospital.

## 2015-10-03 NOTE — Pre-Procedure Instructions (Signed)
TAMMY FROM CARDIOPULMONARY CALLED AND SAID THAT PTS CARDIOLOGIST DR ARIDA WANTS PT TO HAVE AN EKG DONE TODAY (10-03-15) I TOLD TAMMY THAT I HAVE NOT DONE PTS PHONE INTERVIEW YET BUT THAT THEY NEED TO CALL PT AND TELL PT TO COME IN IF THAT IS WHAT DR ARIDA WANTS.  I TOLD TAMMY THAT PT CANNOT COME UP TO PAT BECAUSE WE HAVE NO ONE HERE TO REGISTER PT.  TAMMY IS GOING TO CALL SHAWN IN THE [facility]CER CENTER AND SHAWN IS TO CALL PT AND TELL HER SHE NEEDS TO COME IN FOR EKG TODAY

## 2015-10-06 ENCOUNTER — Ambulatory Visit: Payer: Medicare Other | Admitting: Certified Registered"

## 2015-10-06 ENCOUNTER — Encounter: Payer: Self-pay | Admitting: *Deleted

## 2015-10-06 ENCOUNTER — Encounter
Admission: RE | Disposition: A | Discharge status: Home / Self Care | Payer: Self-pay | Source: Ambulatory Visit | Attending: Internal Medicine

## 2015-10-06 ENCOUNTER — Ambulatory Visit: Payer: Medicare Other

## 2015-10-06 ENCOUNTER — Inpatient Hospital Stay
Admission: RE | Admit: 2015-10-06 | Discharge: 2015-10-07 | DRG: 166 | Disposition: A | Discharge status: Home / Self Care | Payer: Medicare Other | Source: Ambulatory Visit | Attending: Internal Medicine | Admitting: Internal Medicine

## 2015-10-06 DIAGNOSIS — G629 Polyneuropathy, unspecified: Secondary | ICD-10-CM | POA: Diagnosis present

## 2015-10-06 DIAGNOSIS — Z950 Presence of cardiac pacemaker: Secondary | ICD-10-CM | POA: Diagnosis not present

## 2015-10-06 DIAGNOSIS — I252 Old myocardial infarction: Secondary | ICD-10-CM

## 2015-10-06 DIAGNOSIS — Z85118 Personal history of other malignant neoplasm of bronchus and lung: Secondary | ICD-10-CM

## 2015-10-06 DIAGNOSIS — Z79899 Other long term (current) drug therapy: Secondary | ICD-10-CM

## 2015-10-06 DIAGNOSIS — J9601 Acute respiratory failure with hypoxia: Secondary | ICD-10-CM | POA: Diagnosis present

## 2015-10-06 DIAGNOSIS — Z87891 Personal history of nicotine dependence: Secondary | ICD-10-CM | POA: Diagnosis not present

## 2015-10-06 DIAGNOSIS — J440 Chronic obstructive pulmonary disease with acute lower respiratory infection: Secondary | ICD-10-CM | POA: Diagnosis present

## 2015-10-06 DIAGNOSIS — K219 Gastro-esophageal reflux disease without esophagitis: Secondary | ICD-10-CM | POA: Diagnosis present

## 2015-10-06 DIAGNOSIS — Z952 Presence of prosthetic heart valve: Secondary | ICD-10-CM | POA: Diagnosis not present

## 2015-10-06 DIAGNOSIS — Z7901 Long term (current) use of anticoagulants: Secondary | ICD-10-CM | POA: Diagnosis not present

## 2015-10-06 DIAGNOSIS — J209 Acute bronchitis, unspecified: Secondary | ICD-10-CM | POA: Diagnosis present

## 2015-10-06 DIAGNOSIS — J189 Pneumonia, unspecified organism: Secondary | ICD-10-CM | POA: Diagnosis present

## 2015-10-06 DIAGNOSIS — I110 Hypertensive heart disease with heart failure: Secondary | ICD-10-CM | POA: Diagnosis present

## 2015-10-06 DIAGNOSIS — Z951 Presence of aortocoronary bypass graft: Secondary | ICD-10-CM

## 2015-10-06 DIAGNOSIS — I5022 Chronic systolic (congestive) heart failure: Secondary | ICD-10-CM | POA: Diagnosis present

## 2015-10-06 DIAGNOSIS — E039 Hypothyroidism, unspecified: Secondary | ICD-10-CM | POA: Diagnosis present

## 2015-10-06 DIAGNOSIS — R918 Other nonspecific abnormal finding of lung field: Secondary | ICD-10-CM | POA: Diagnosis not present

## 2015-10-06 DIAGNOSIS — I480 Paroxysmal atrial fibrillation: Secondary | ICD-10-CM | POA: Diagnosis present

## 2015-10-06 DIAGNOSIS — J441 Chronic obstructive pulmonary disease with (acute) exacerbation: Secondary | ICD-10-CM

## 2015-10-06 DIAGNOSIS — C3491 Malignant neoplasm of unspecified part of right bronchus or lung: Secondary | ICD-10-CM

## 2015-10-06 DIAGNOSIS — Z9861 Coronary angioplasty status: Secondary | ICD-10-CM

## 2015-10-06 DIAGNOSIS — E785 Hyperlipidemia, unspecified: Secondary | ICD-10-CM | POA: Diagnosis present

## 2015-10-06 DIAGNOSIS — R0902 Hypoxemia: Secondary | ICD-10-CM | POA: Diagnosis present

## 2015-10-06 DIAGNOSIS — I2510 Atherosclerotic heart disease of native coronary artery without angina pectoris: Secondary | ICD-10-CM | POA: Diagnosis present

## 2015-10-06 HISTORY — PX: ELECTROMAGNETIC NAVIGATION BROCHOSCOPY: SHX5369

## 2015-10-06 HISTORY — DX: Acute myocardial infarction, unspecified: I21.9

## 2015-10-06 HISTORY — PX: ENDOBRONCHIAL ULTRASOUND: SHX5096

## 2015-10-06 HISTORY — DX: Gastro-esophageal reflux disease without esophagitis: K21.9

## 2015-10-06 HISTORY — DX: Hypothyroidism, unspecified: E03.9

## 2015-10-06 HISTORY — DX: Polyneuropathy, unspecified: G62.9

## 2015-10-06 LAB — BASIC METABOLIC PANEL
Anion gap: 10 (ref 5–15)
BUN: 23 mg/dL — ABNORMAL HIGH (ref 6–20)
CHLORIDE: 105 mmol/L (ref 101–111)
CO2: 24 mmol/L (ref 22–32)
CREATININE: 0.97 mg/dL (ref 0.44–1.00)
Calcium: 9 mg/dL (ref 8.9–10.3)
GFR calc non Af Amer: 55 mL/min — ABNORMAL LOW (ref >60)
Glucose, Bld: 150 mg/dL — ABNORMAL HIGH (ref 65–99)
POTASSIUM: 4.6 mmol/L (ref 3.5–5.1)
SODIUM: 139 mmol/L (ref 135–145)

## 2015-10-06 LAB — CBC
HCT: 33.4 % — ABNORMAL LOW (ref 35.0–47.0)
HEMOGLOBIN: 10.7 g/dL — AB (ref 12.0–16.0)
MCH: 27.2 pg (ref 26.0–34.0)
MCHC: 32.2 g/dL (ref 32.0–36.0)
MCV: 84.7 fL (ref 80.0–100.0)
Platelets: 280 10*3/uL (ref 150–440)
RBC: 3.94 MIL/uL (ref 3.80–5.20)
RDW: 20.2 % — ABNORMAL HIGH (ref 11.5–14.5)
WBC: 6.8 10*3/uL (ref 3.6–11.0)

## 2015-10-06 LAB — MRSA PCR SCREENING: MRSA BY PCR: NEGATIVE

## 2015-10-06 SURGERY — ENDOBRONCHIAL ULTRASOUND (EBUS)
Anesthesia: General

## 2015-10-06 MED ORDER — DEXAMETHASONE SODIUM PHOSPHATE 10 MG/ML IJ SOLN
4.0000 mg | Freq: Once | INTRAMUSCULAR | Status: AC
  Administered 2015-10-06: 4 mg via INTRAVENOUS

## 2015-10-06 MED ORDER — FENTANYL CITRATE (PF) 100 MCG/2ML IJ SOLN
INTRAMUSCULAR | Status: DC | PRN
  Administered 2015-10-06: 100 ug via INTRAVENOUS

## 2015-10-06 MED ORDER — PHENYLEPHRINE HCL 10 MG/ML IJ SOLN
INTRAMUSCULAR | Status: DC | PRN
  Administered 2015-10-06 (×5): 200 ug via INTRAVENOUS
  Administered 2015-10-06: 100 ug via INTRAVENOUS

## 2015-10-06 MED ORDER — MOMETASONE FURO-FORMOTEROL FUM 100-5 MCG/ACT IN AERO
2.0000 | INHALATION_SPRAY | Freq: Two times a day (BID) | RESPIRATORY_TRACT | Status: DC
  Administered 2015-10-06 – 2015-10-07 (×3): 2 via RESPIRATORY_TRACT
  Filled 2015-10-06: qty 8.8

## 2015-10-06 MED ORDER — SUCCINYLCHOLINE CHLORIDE 20 MG/ML IJ SOLN
INTRAMUSCULAR | Status: DC | PRN
  Administered 2015-10-06: 90 mg via INTRAVENOUS

## 2015-10-06 MED ORDER — ONDANSETRON HCL 4 MG/2ML IJ SOLN
INTRAMUSCULAR | Status: DC | PRN
  Administered 2015-10-06: 4 mg via INTRAVENOUS

## 2015-10-06 MED ORDER — LEVOFLOXACIN 500 MG PO TABS
500.0000 mg | ORAL_TABLET | Freq: Every day | ORAL | Status: DC
  Administered 2015-10-06 – 2015-10-07 (×2): 500 mg via ORAL
  Filled 2015-10-06 (×2): qty 1

## 2015-10-06 MED ORDER — ALBUTEROL SULFATE (2.5 MG/3ML) 0.083% IN NEBU
INHALATION_SOLUTION | RESPIRATORY_TRACT | Status: AC
  Filled 2015-10-06: qty 3

## 2015-10-06 MED ORDER — METHYLPREDNISOLONE SODIUM SUCC 40 MG IJ SOLR
20.0000 mg | Freq: Two times a day (BID) | INTRAMUSCULAR | Status: DC
  Administered 2015-10-06 – 2015-10-07 (×2): 20 mg via INTRAVENOUS
  Filled 2015-10-06 (×2): qty 1

## 2015-10-06 MED ORDER — DEXAMETHASONE SODIUM PHOSPHATE 10 MG/ML IJ SOLN
INTRAMUSCULAR | Status: AC
  Administered 2015-10-06: 4 mg via INTRAVENOUS
  Filled 2015-10-06: qty 1

## 2015-10-06 MED ORDER — HYDROMORPHONE HCL 1 MG/ML IJ SOLN: 0.2500 mg | INTRAMUSCULAR | Status: DC | PRN

## 2015-10-06 MED ORDER — METOPROLOL TARTRATE 25 MG PO TABS
25.0000 mg | ORAL_TABLET | Freq: Two times a day (BID) | ORAL | Status: DC
  Administered 2015-10-06 – 2015-10-07 (×2): 25 mg via ORAL
  Filled 2015-10-06 (×2): qty 1

## 2015-10-06 MED ORDER — LACTATED RINGERS IV SOLN
INTRAVENOUS | Status: DC
  Administered 2015-10-06 (×2): via INTRAVENOUS

## 2015-10-06 MED ORDER — BUDESONIDE 0.25 MG/2ML IN SUSP
0.2500 mg | Freq: Two times a day (BID) | RESPIRATORY_TRACT | Status: DC
  Administered 2015-10-07: 0.25 mg via RESPIRATORY_TRACT
  Filled 2015-10-06 (×2): qty 2

## 2015-10-06 MED ORDER — PANTOPRAZOLE SODIUM 40 MG PO TBEC
40.0000 mg | DELAYED_RELEASE_TABLET | Freq: Every day | ORAL | Status: DC
  Administered 2015-10-06 – 2015-10-07 (×2): 40 mg via ORAL
  Filled 2015-10-06 (×2): qty 1

## 2015-10-06 MED ORDER — TIOTROPIUM BROMIDE MONOHYDRATE 18 MCG IN CAPS
18.0000 ug | ORAL_CAPSULE | Freq: Every day | RESPIRATORY_TRACT | Status: DC
  Administered 2015-10-06: 18 ug via RESPIRATORY_TRACT

## 2015-10-06 MED ORDER — METHYLPREDNISOLONE SODIUM SUCC 125 MG IJ SOLR
60.0000 mg | Freq: Once | INTRAMUSCULAR | Status: AC
  Administered 2015-10-06: 60 mg via INTRAVENOUS
  Filled 2015-10-06: qty 2

## 2015-10-06 MED ORDER — ROCURONIUM BROMIDE 100 MG/10ML IV SOLN
INTRAVENOUS | Status: DC | PRN
  Administered 2015-10-06: 30 mg via INTRAVENOUS
  Administered 2015-10-06: 10 mg via INTRAVENOUS

## 2015-10-06 MED ORDER — GUAIFENESIN ER 600 MG PO TB12
600.0000 mg | ORAL_TABLET | Freq: Two times a day (BID) | ORAL | Status: DC
  Administered 2015-10-06 – 2015-10-07 (×2): 600 mg via ORAL
  Filled 2015-10-06 (×2): qty 1

## 2015-10-06 MED ORDER — LEVOTHYROXINE SODIUM 75 MCG PO TABS
75.0000 ug | ORAL_TABLET | Freq: Every day | ORAL | Status: DC
  Administered 2015-10-07: 75 ug via ORAL
  Filled 2015-10-06: qty 1

## 2015-10-06 MED ORDER — APIXABAN 5 MG PO TABS
5.0000 mg | ORAL_TABLET | Freq: Two times a day (BID) | ORAL | Status: DC
Start: 2015-10-06 — End: 2015-10-07
  Administered 2015-10-06 – 2015-10-07 (×2): 5 mg via ORAL
  Filled 2015-10-06 (×2): qty 1

## 2015-10-06 MED ORDER — ATORVASTATIN CALCIUM 20 MG PO TABS
20.0000 mg | ORAL_TABLET | Freq: Every day | ORAL | Status: DC
  Administered 2015-10-06: 20 mg via ORAL
  Filled 2015-10-06: qty 1

## 2015-10-06 MED ORDER — TIOTROPIUM BROMIDE MONOHYDRATE 18 MCG IN CAPS
18.0000 ug | ORAL_CAPSULE | Freq: Every day | RESPIRATORY_TRACT | Status: DC
  Administered 2015-10-06 – 2015-10-07 (×2): 18 ug via RESPIRATORY_TRACT
  Filled 2015-10-06: qty 5

## 2015-10-06 MED ORDER — IPRATROPIUM-ALBUTEROL 0.5-2.5 (3) MG/3ML IN SOLN
3.0000 mL | RESPIRATORY_TRACT | Status: DC
  Administered 2015-10-06 – 2015-10-07 (×6): 3 mL via RESPIRATORY_TRACT
  Filled 2015-10-06 (×7): qty 3

## 2015-10-06 MED ORDER — MIDAZOLAM HCL 2 MG/2ML IJ SOLN
INTRAMUSCULAR | Status: DC | PRN
Start: 2015-10-06 — End: 2015-10-06
  Administered 2015-10-06: 2 mg via INTRAVENOUS

## 2015-10-06 MED ORDER — METOPROLOL SUCCINATE ER 25 MG PO TB24
25.0000 mg | ORAL_TABLET | Freq: Every day | ORAL | Status: DC
  Administered 2015-10-06: 25 mg via ORAL
  Filled 2015-10-06: qty 1

## 2015-10-06 MED ORDER — ALBUTEROL SULFATE (2.5 MG/3ML) 0.083% IN NEBU
2.5000 mg | INHALATION_SOLUTION | Freq: Four times a day (QID) | RESPIRATORY_TRACT | Status: DC | PRN
  Administered 2015-10-06 (×2): 2.5 mg via RESPIRATORY_TRACT

## 2015-10-06 MED ORDER — LORATADINE 10 MG PO TABS
10.0000 mg | ORAL_TABLET | Freq: Every day | ORAL | Status: DC
  Administered 2015-10-06 – 2015-10-07 (×2): 10 mg via ORAL
  Filled 2015-10-06 (×2): qty 1

## 2015-10-06 MED ORDER — ONDANSETRON HCL 4 MG/2ML IJ SOLN: 4.0000 mg | Freq: Once | INTRAMUSCULAR | Status: DC | PRN

## 2015-10-06 MED ORDER — ALBUTEROL SULFATE (2.5 MG/3ML) 0.083% IN NEBU: 2.5000 mg | INHALATION_SOLUTION | RESPIRATORY_TRACT | Status: DC

## 2015-10-06 MED ORDER — ALBUTEROL SULFATE (2.5 MG/3ML) 0.083% IN NEBU
INHALATION_SOLUTION | RESPIRATORY_TRACT | Status: AC
Start: 2015-10-06 — End: 2015-10-06
  Administered 2015-10-06: 2.5 mg via RESPIRATORY_TRACT
  Filled 2015-10-06: qty 3

## 2015-10-06 MED ORDER — IPRATROPIUM-ALBUTEROL 0.5-2.5 (3) MG/3ML IN SOLN: 3.0000 mL | RESPIRATORY_TRACT | Status: DC

## 2015-10-06 MED ORDER — LIDOCAINE HCL (CARDIAC) 20 MG/ML IV SOLN
INTRAVENOUS | Status: DC | PRN
  Administered 2015-10-06: 100 mg via INTRAVENOUS

## 2015-10-06 MED ORDER — PROPOFOL 10 MG/ML IV BOLUS
INTRAVENOUS | Status: DC | PRN
  Administered 2015-10-06: 100 mg via INTRAVENOUS

## 2015-10-06 MED ORDER — SACUBITRIL-VALSARTAN 24-26 MG PO TABS
1.0000 | ORAL_TABLET | Freq: Two times a day (BID) | ORAL | Status: DC
  Administered 2015-10-06 – 2015-10-07 (×2): 1 via ORAL
  Filled 2015-10-06 (×3): qty 1

## 2015-10-06 MED ORDER — CEPASTAT 14.5 MG MT LOZG: 1.0000 | LOZENGE | OROMUCOSAL | Status: DC | PRN

## 2015-10-06 MED ORDER — SUGAMMADEX SODIUM 200 MG/2ML IV SOLN
INTRAVENOUS | Status: DC | PRN
  Administered 2015-10-06: 140 mg via INTRAVENOUS

## 2015-10-06 NOTE — Anesthesia Preprocedure Evaluation (Signed)
Anesthesia Evaluation  Patient identified by MRN, date of birth, ID band Patient awake    Reviewed: Allergy & Precautions, NPO status , Patient's Chart, lab work & pertinent test results  Airway Mallampati: III  TM Distance: >3 FB Neck ROM: Limited    Dental  (+) Upper Dentures, Lower Dentures   Pulmonary shortness of breath and at rest, pneumonia, resolved, COPD,  COPD inhaler, former smoker,  39 pk yr hx, quit 2001.   breath sounds clear to auscultation       Cardiovascular Exercise Tolerance: Poor hypertension, Pt. on medications and Pt. on home beta blockers + CAD, + Past MI, + CABG and +CHF  + dysrhythmias Atrial Fibrillation + pacemaker + Valvular Problems/Murmurs MR  Rhythm:Regular Rate:Normal  Mitral ring repair, multiple MIs, 2 V CABG 2002, on Eliquis, last dose 10/03/15.   Neuro/Psych    GI/Hepatic GERD  Medicated and Controlled,  Endo/Other  Hypothyroidism Treated.  Renal/GU      Musculoskeletal   Abdominal (+)  Abdomen: soft.    Peds  Hematology   Anesthesia Other Findings [place] tumor of the lung--respiratory compromise and spent a night here for low O2 saturations with last bronchoscopy. Quit smoking in 2001. CHF, EF 30-35%.  Reproductive/Obstetrics                             Anesthesia Physical Anesthesia Plan  ASA: IV  Anesthesia Plan: General   Post-op Pain Management:    Induction: Intravenous  Airway Management Planned: Oral ETT  Additional Equipment:   Intra-op Plan:   Post-operative Plan: Extubation in OR  Informed Consent: I have reviewed the patients History and Physical, chart, labs and discussed the procedure including the risks, benefits and alternatives for the proposed anesthesia with the patient or authorized representative who has indicated his/her understanding and acceptance.     Plan Discussed with: CRNA  Anesthesia Plan Comments: (May once  again, need to stay longer for oxygen and respiratory support. However, her tumor is smaller now, and that may help. She is now on 5 mg prednisone OD.)        Anesthesia Quick Evaluation

## 2015-10-06 NOTE — Interval H&P Note (Signed)
History and Physical Interval Note:  10/06/2015 9:45 AM  Jenny Cooper  has presented today for surgery, with the diagnosis of PET POSITIVE ADENOPATHY  The various methods of treatment have been discussed with the patient and family. After consideration of risks, benefits and other options for treatment, the patient has consented to  Procedure(s): ENDOBRONCHIAL ULTRASOUND (N/A) ELECTROMAGNETIC NAVIGATION BRONCHOSCOPY (N/A) as a surgical intervention .  The patient's history has been reviewed, patient examined, no change in status, stable for surgery.  I have reviewed the patient's chart and labs.  Questions were answered to the patient's satisfaction.     Flora Lipps

## 2015-10-06 NOTE — Anesthesia Postprocedure Evaluation (Signed)
Anesthesia Post Note  Patient: Jenny Cooper  Procedure(s) Performed: Procedure(s) (LRB): ENDOBRONCHIAL ULTRASOUND (N/A) ELECTROMAGNETIC NAVIGATION BRONCHOSCOPY (N/A)  Patient location during evaluation: PACU Anesthesia Type: General Level of consciousness: awake and alert Pain management: pain level controlled Vital Signs Assessment: vitals unstable Respiratory status: non-rebreather facemask and respiratory function unstable Cardiovascular status: blood pressure returned to baseline Postop Assessment: no headache Anesthetic complications: no (See below. This was expected.) Comments: As expected, she was unable to maintain oxygen saturations above 90%, just as during her last procedure. She and her daughter were made aware of the likelihood that she would once again be admitted and kept overnight on oxygen and perhaps CPAP or BiPAP as well. Dr Mortimer Fries was in agreement and had our hospitalist come to see her and admit her. RT is coming to set up CPAP. She has also been treated with oxygen via a nonrebreather mask, albuterol and decadron. Her best room air sat was 92%, pre-procedure and she desaturates to the low 80s and upper 70s very easily--with coughing. She had an extensive bronchoscopy with multiple biopsies today, with some bleeding since recently on Eliquis. We also discussed the possibility that she may soon need home oxygen.    Last Vitals:  Filed Vitals:   10/06/15 1340 10/06/15 1356  BP:  103/62  Pulse: 67 91  Temp:    Resp: 20 26    Last Pain:  Filed Vitals:   10/06/15 1358  PainSc: 0-No pain                 Hashir Deleeuw M

## 2015-10-06 NOTE — H&P (View-Only) (Signed)
[facility] @ [facility] [phone]  [phone]     Ramon Brant OB: [birth year]  MR#: [id number]  NOB#:[id number]  Patient Care Team: Forest Gleason, MD as PCP - General (Oncology) Minna Merritts, MD as Consulting Physician (Cardiology) Vilinda Boehringer, MD as Consulting Physician (Internal Medicine)  CHIEF COMPLAINT:  Chief Complaint  Patient presents with  . 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   Patient had compression of the right mainstem bronchus because of enlarged lymph node and a mass. Stage is T4 N1 M0 tumor stage IIIa diagnosis in January of 2016 2.started on radiation and chemotherapy from October 21, 2014 3.finished 6 cycles of carboplatinum and Taxol  in March  29 th of 2016, As finished concurrent chemoradiation therapy and PET scan shows significant response 4. started on consolidation chemotherapy with carboplatinum and Taxol 2 cycle   Patient finished 2 cycles of consultation therapy on March 06, 2015   5.  Atrial fibrillation diagnosis in August of 2016 on  eloquis      INTERVAL HISTORY: 80 year old lady was finished chemoradiation therapy came today further follow-up regarding carcinoma of lung.   Patient was gradually improving however in last few days started increasing cough yellowish expectoration.  Low-grade fever.  Patient is also making a trip to [place] extremely anxious. Patient continues to have increasing shortness of breath and cough . Patient has been referred to pulmonologist for evaluation.  No hemoptysis.  No chest pain.  Appetite has been stable. Patient states she continues to cough all the time. She gets extemely SOB . States she is also having problems with A-fib and will be reevaluated after her visit today and an appointment with Dr.  Augustina Mood   Patient is here for ongoing evaluation and had a PET scan done continues to shortness of breath. Cough with occasional yellowish expectoration  REVIEW OF SYSTEMS:    general status: Patient is feeling weak and tired.. Gradual improvement in the strength or pure of last few days  No change in a performance status.  No chills.  No fever. HEENT: Alopecia, resolving.  No evidence of stomatitis Lungs: Patient started increasing cough , increasing shortness of breath Cardiac: No chest pain or paroxysmal nocturnal dyspnea GI: No nausea no vomiting no diarrhea no abdominal pain Skin: No rash Lower extremity no swelling Neurological system: Grade 1 neuropathy  No other focal signs  Musculoskeletal system no bony pains  As per HPI. Otherwise, a complete review of systems is negatve.  PAST MEDICAL HISTORY: Past Medical History  Diagnosis Date  . HTN (hypertension)   . Pacemaker     a. MDT 2002; b. generator replacement 2013; c. followed by Dr. Omelia Blackwater, MD  . HLD (hyperlipidemia)   . CAD (coronary artery disease)     a. s/p MI x 2 in 2002 s/p PCI x 2 in 2002; b. s/p 2v CABG 2002; c. stress echo 07/2004 w/ evi of pos & inf infarct & no evi of ischemia; d. 4/08 dipyridamole scan w/ multiple areas of infarct, no ischemia, EF 49%; e. cath 04/28/15 3v CAD, med Rx rec, no targets for revasc, LM lum irregs, pLAD 30%, 100%, ost-pLCx 60%, [name] 99%, OM2 100%, p-mRCA 90%, [name] 100% L-R collats, VG-mLAD irregs, VG-OM2 oc  . Lung cancer ([place])   .  History of colonoscopy 2013  . History of mammography, screening 2015  . History of Papanicolaou smear of cervix 2013  . Carcinoma of right lung ([place]) 01/03/2015    a. followed by Dr. Oliva Bustard  . Mitral regurgitation     a. s/p mitral ring placement 09/2000; b. echo 09/2010: EF 50%, inf HK, post AK, mild MR, prosthetic mitral valve ring w/ peak gradient of 10 mmHg; b. echo 2/13: EF 50%, mild MR/TR     . History of blood clots     12/2001  . Atypical atrial  flutter ([place])     a. s/p ablation 07/27/2013  . PAF (paroxysmal atrial fibrillation) ([facility])     a. on Eliquis   . COPD (chronic obstructive pulmonary disease) ([place])   . Chronic systolic CHF (congestive heart failure) ([place])     a. echo 03/2015: EF 30-35%, sev ant/inf/pos HK, in mild to mod MR    PAST SURGICAL HISTORY: Past Surgical History  Procedure Laterality Date  . Appendectomy    . Vaginal hysterectomy    . Cardiac bypass    . Pacemaker insertion N/A   . Cardiac catheterization N/A 04/28/2015    Procedure: Left Heart Cath and Coronary Angiography;  Surgeon: Wellington Hampshire, MD;  Location: [facility];  Service: Cardiovascular;  Laterality: N/A;    FAMILY HISTORY Family History  Problem Relation Age of Onset  . COPD Mother     sister, and brother        ADVANCED DIRECTIVES:Patient does not have any advanced healthcare directive. Information has been given. HEALTH MAINTENANCE: Social History  Substance Use Topics  . Smoking status: Former Smoker -- 2.00 packs/day for 42 years    Types: Cigarettes    Quit date: 08/30/2000  . Smokeless tobacco: None     Comment: quit smoking in 08/28/2000  . Alcohol Use: 6.0 oz/week    10 Glasses of wine per week     Comment: 2 glasses of wine per day     Allergies  Allergen Reactions  . Lovenox [Enoxaparin Sodium] Itching  . Meperidine Other (See Comments)    Other Reaction: pt does not like how it makes her feel    Current Outpatient Prescriptions  Medication Sig Dispense Refill  . apixaban (ELIQUIS) 5 MG TABS tablet Take 5 mg by mouth 2 (two) times daily.    Marland Kitchen atorvastatin (LIPITOR) 20 MG tablet Take 20 mg by mouth daily.    . calcium carbonate (OS-CAL) 600 MG TABS tablet Take 600 mg by mouth 2 (two) times daily with a meal.    . Cetirizine HCl 10 MG CAPS Take 1 capsule (10 mg total) by mouth 1 day or 1 dose. 30 capsule 5  . chlorpheniramine-HYDROcodone (TUSSIONEX PENNKINETIC ER) 10-8 MG/5ML SUER Take 5 mLs by  mouth at bedtime as needed for cough. 140 mL 0  . Fluticasone-Salmeterol (ADVAIR DISKUS) 250-50 MCG/DOSE AEPB Inhale 1 puff into the lungs every 12 (twelve) hours. 60 each 0  . ipratropium (ATROVENT) 0.03 % nasal spray Place 2 sprays into both nostrils 2 (two) times daily. 30 mL 5  . ipratropium-albuterol (DUONEB) 0.5-2.5 (3) MG/3ML SOLN TAKE 3ML BY NEBULIZER EVERY 6 HOURS AS NEEDED  2  . levothyroxine (SYNTHROID, LEVOTHROID) 75 MCG tablet Take 75 mcg by mouth daily before breakfast.    . metoprolol succinate (TOPROL XL) 25 MG 24 hr tablet Take 1 tablet (25 mg total) by mouth daily. Take '25mg'$  every morning and '50mg'$  every evening 90 tablet  5  . omeprazole (PRILOSEC) 20 MG capsule Take 20 mg by mouth daily.    . predniSONE (DELTASONE) 5 MG tablet Start '50mg'$  taper by '5mg'$  daily until complete. 55 tablet 0  . sacubitril-valsartan (ENTRESTO) 24-26 MG Take 1 tablet by mouth 2 (two) times daily. 60 tablet 5   No current facility-administered medications for this visit.   Facility-Administered Medications Ordered in Other Visits  Medication Dose Route Frequency Provider Last Rate Last Dose  . sodium chloride 0.9 % injection 10 mL  10 mL Intravenous PRN Forest Gleason, MD   10 mL at 02/19/15 1000    OBJECTIVE:  Filed Vitals:   10/02/15 0828  BP: 106/68  Pulse: 102  Temp: 98.2 F (36.8 C)  Resp: 18     Body mass index is 24.44 kg/(m^2).    ECOG FS:1 - Symptomatic but completely ambulatory  PHYSICAL EXAM: GENERAL:  Well developed, well nourished, sitting comfortably in the exam room in no acute distress. MENTAL STATUS:  Alert and oriented to person, place and time. HEAD: alopecia Normocephalic, atraumatic, face symmetric, no Cushingoid features. EYES: eyes.  Pupils equal round and reactive to light and accomodation.  No conjunctivitis or scleral icterus. ENT:  Oropharynx clear without lesion.  Tongue normal. Mucous membranes moist.  RESPIRATORY:  Occasional rhonchi CARDIOVASCULAR:  Regular rate  and rhythm without murmur, rub or gallop. . ABDOMEN:  Soft, non-tender, with active bowel sounds, and no hepatosplenomegaly.  No masses. BACK:  No CVA tenderness.  No tenderness on percussion of the back or rib cage. SKIN:  No rashes, ulcers or lesions. EXTREMITIES: No edema, no skin discoloration or tenderness.  No palpable cords. LYMPH NODES: No palpable cervical, supraclavicular, axillary or inguinal adenopathy  NEUROLOGICAL: Unremarkable. PSYCH:  Appropriate.   LAB RESULTS:  Hospital Outpatient Visit on 10/01/2015  Component Date Value Ref Range Status  . Glucose-Capillary 10/01/2015 131* 65 - 99 mg/dL Final   PET scan has been reviewed independently Lab data has been reviewed.  Assessment and plan Stage III non-small cell carcinoma of lung.Marland Kitchen  PET scan shows multiple pulmonary patchy infiltrate.  Atypical infection versus progressing cancer  We start patient on prednisone 60 mg taper by 5 mg to 0 Doxycycline 100 mg twice a day for 10 days Consult Dr. Stevenson Clinch  for possible bronchoscopy. mucinex   600 mg by mouth twice a day Reevaluate in 2 weeks    Acute bronchitis.  Patient will be started on steroid and antibiotic. If there is no improvement patient will call me    Forest Gleason, MD   10/02/2015 8:47 AM

## 2015-10-06 NOTE — Anesthesia Procedure Notes (Signed)
Procedure Name: Intubation Date/Time: 10/06/2015 10:17 AM Performed by: Silvana Newness Pre-anesthesia Checklist: Patient identified, Emergency Drugs available, Suction available, Patient being monitored and Timeout performed Patient Re-evaluated:Patient Re-evaluated prior to inductionOxygen Delivery Method: Circle system utilized Preoxygenation: Pre-oxygenation with 100% oxygen Intubation Type: IV induction Ventilation: Mask ventilation without difficulty Laryngoscope Size: Mac and 3 Grade View: Grade I Tube type: Oral Tube size: 8.5 mm Number of attempts: 1 Airway Equipment and Method: Rigid stylet Placement Confirmation: ETT inserted through vocal cords under direct vision,  positive ETCO2 and breath sounds checked- equal and bilateral Secured at: 20 cm Tube secured with: Tape Dental Injury: Teeth and Oropharynx as per pre-operative assessment

## 2015-10-06 NOTE — Consult Note (Addendum)
[facility] Consultation     Date: 10/06/2015,   MRN# [id number] Jenny Cooper 12/06/1935 Code Status:  Code Status History    Date Active Date Inactive Code Status Order ID Comments User Context   04/28/2015  1:35 PM 04/28/2015  6:58 PM Full Code [id number]  Wellington Hampshire, MD Inpatient   04/01/2015  2:19 PM 04/07/2015  5:36 PM Full Code [id number]  Max Sane, MD Inpatient     [facility] day:'@LENGTHOFSTAYDAYS'$ @ Referring MD: '@ATDPROV'$ @     PCP:      AdmissionWeight: 154 lb (69.854 kg)                 CurrentWeight: 154 lb (69.854 kg) Jenny Cooper is a 80 y.o. old female seen post op for abnormal PET scan     CHIEF COMPLAINT:   hypoxia   HISTORY OF PRESENT ILLNESS  80 yo whit female 80 yo white female with h/o lung cancer seen today for abnormal PET scan with h/o RXT and chemo Post procedure, patient has hypoxia. 1 year ago, patient was admitted for 1-2 days post op having had EBUS She was groggy post op with o2 sat in the 70"s with coughing spells, when she was awake her o2 sat increased to 88% Her basline 02 sat reported as 92%  She is NOT in any distress, no SOB, just sore throat She has ne fevers, chils, no NVD  Home Medication:  No current outpatient prescriptions on file.  Current Medication:   Current facility-administered medications:  .  dexamethasone (DECADRON) injection 4 mg, 4 mg, Intravenous, Once, Amy Rice, MD .  lactated ringers infusion, , Intravenous, Continuous, Gunnar Bulla, MD, Last Rate: 50 mL/hr at 10/06/15 0910  Facility-Administered Medications Ordered in Other Encounters:  .  sodium chloride 0.9 % injection 10 mL, 10 mL, Intravenous, PRN, Forest Gleason, MD, 10 mL at 02/19/15 1000     ALLERGIES   Lovenox and Meperidine     REVIEW OF SYSTEMS   Review of Systems  Constitutional: Negative for fever, chills and weight loss.  HENT: Positive for sore throat.   Eyes: Negative for blurred vision.  Respiratory:  Positive for cough. Negative for shortness of breath and wheezing.   Cardiovascular: Negative for chest pain, palpitations and leg swelling.  Gastrointestinal: Negative for nausea, vomiting and abdominal pain.  Musculoskeletal: Negative.   Skin: Negative for rash.  Neurological: Negative for dizziness, tingling, tremors and headaches.  Endo/Heme/Allergies: Negative.   Psychiatric/Behavioral: The patient is nervous/anxious.   All other systems reviewed and are negative.    VS: BP 117/77 mmHg  Pulse 89  Temp(Src) 97.9 F (36.6 C) (Oral)  Resp 20  Ht 5' 7.5" (1.715 m)  Wt 154 lb (69.854 kg)  BMI 23.75 kg/m2  SpO2 74%     PHYSICAL EXAM   Physical Exam  Constitutional: She is oriented to person, place, and time. She appears well-developed and well-nourished. No distress.  HENT:  Head: Normocephalic and atraumatic.  Mouth/Throat: No oropharyngeal exudate.  Eyes: EOM are normal. Pupils are equal, round, and reactive to light.  Neck: Normal range of motion. Neck supple.  Cardiovascular: Normal rate, regular rhythm and normal heart sounds.   No murmur heard. Pulmonary/Chest: No stridor. No respiratory distress. She has wheezes. She has rales.  Abdominal: Soft. Bowel sounds are normal. She exhibits no distension. There is no tenderness. There is no rebound.  Musculoskeletal: Normal range of motion. She exhibits no edema.  Neurological: She is alert and  oriented to person, place, and time. She displays normal reflexes. Coordination normal.  Skin: Skin is warm. She is not diaphoretic.  Psychiatric: She has a normal mood and affect.         IMAGING    Ct Chest Wo Contrast  10/06/2015  CLINICAL DATA:  80 year old female with cough for the past 2 years. 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 EXAM: CT CHEST WITHOUT CONTRAST TECHNIQUE: Multidetector CT imaging of the chest was performed following the  standard protocol without IV contrast. COMPARISON:  Multiple priors, most recently PET-CT 10/01/2015. FINDINGS: Mediastinum/Lymph Nodes: Heart size is borderline enlarged. Pericardial thickening and calcification anterior to the right ventricle. In addition, there is myocardial thickening involving the inferior and inferolateral wall segments of the left ventricle from the mid ventricle to the base, presumably dystrophic calcification at site of prior left circumflex coronary artery territory myocardial infarction(s). There is atherosclerosis of the thoracic aorta, the great vessels of the mediastinum and the coronary arteries, including calcified atherosclerotic plaque in the left main, left anterior descending, left circumflex and right coronary arteries. There continues to be extensive soft tissue thickening in and around the right hilar region, which is poorly evaluated on today's noncontrast CT examination. This causes severe narrowing at the origin of the right upper lobe bronchus (image 24 of series 2). No other definite mediastinal or left hilar lymphadenopathy noted on today's noncontrast CT examination. Borderline enlarged superior mediastinal lymph node posterolateral to the right side of the trachea (image 7 of series 2), similar to the prior study. Status post median sternotomy for CABG and mitral annuloplasty. Left-sided pacemaker device in position with lead tips terminating in the right atrium and right ventricular apex. Esophagus is unremarkable in appearance. No axillary lymphadenopathy. Right internal jugular single-lumen porta cath with tip terminating in the distal superior vena cava. Lungs/Pleura: Chronic right-sided volume loss and architectural distortion most evident in the perihilar aspect of the right lung, particularly in the right upper lobe, compatible with an area of postradiation mass-like fibrosis. Radiating outward from this central area there are extensive areas of ground-glass  attenuation and septal thickening throughout the right lung, most evident in the superior aspect of the right lower lobe and in the right upper lobe. These findings appear very similar to the recent head CT from 10/01/2015, but have clearly increased compared to prior chest CT 05/26/2015. In addition, there is again multifocal nodular and mass-like areas of airspace consolidation throughout the lungs bilaterally, but most severe in the left lung, largest of which is in the left lower lobe measuring 3.3 x 4.7 cm (image 44 of series 4). These findings appear essentially unchanged compared to the recent PET-CT. Trace right partially loculated pleural effusion the mid right hemithorax is unchanged. No left pleural effusion. Upper Abdomen: Unremarkable. Musculoskeletal/Soft Tissues: Median sternotomy wires. There are no aggressive appearing lytic or blastic lesions noted in the visualized portions of the skeleton. IMPRESSION: 1. The appearance of the chest is very similar to recent PET-CT 10/01/2015, as detailed above. Given that radiation therapy was completed greater than 6 months ago, findings on today's examination are highly unlikely to be a post treatment related change. Given the interval enlargement of the area of chronic architectural distortion in the perihilar aspect of the right upper lobe noted on prior PET-CT which demonstrated some low-level hypermetabolism, the possibility of local recurrence of disease is not excluded. Accordingly, the findings in the perihilar aspect  of the right lung radiating outward could reflect evolving lymphangitic spread of tumor. 2. In addition, the other multifocal nodular and mass-like areas of airspace consolidation throughout the lungs bilaterally are highly concerning for atypical infectious process, potentially fungal pneumonia. Clinical correlation is recommended. The possibility of new metastatic disease throughout the lungs bilaterally is not excluded, but is less  favored given the relatively rapid development between examinations from 05/26/2015 and 10/01/2015. 3. Trace right partially loculated pleural effusion is unchanged. 4. Atherosclerosis, including left main and 3 vessels coronary artery disease. Status post median sternotomy for CABG as well as mitral annuloplasty. Electronically Signed   By: Vinnie Langton M.D.   On: 10/06/2015 09:44   Dg C-arm 1-60 Min-no Report  10/06/2015  CLINICAL DATA: ENB C-ARM 1-60 MINUTES Fluoroscopy was utilized by the requesting physician.  No radiographic interpretation.       ASSESSMENT/PLAN   80 yo white female with h/o Stage 3 Lung Cancer Sq cell cancer with s/p ENB and  EBUS today for abnormal PET scan with post op hypoxia from pneumonitis and underlying COPD  1.oxygen as needed to keep 02 sat 88-92% 2.start IV steorids 3.start albuterol nebs and pulmicort nebs 4 start dulera,spiriva 5.no need for cxr  I have discussed case with Dr. Manuella Ghazi hospitalist for admission  The Patient requires high complexity decision making for assessment and support, frequent evaluation and titration of therapies, application of advanced monitoring technologies and extensive interpretation of multiple databases.    Corrin Parker, M.D.  [facility]  Medical Director [place] Director [facility] Cardio-Pulmonary Department

## 2015-10-06 NOTE — H&P (Signed)
[facility] NAME: Jenny Cooper    MR#:  [id number]  DATE OF BIRTH:  25-Apr-1936  DATE OF ADMISSION:  10/06/2015  PRIMARY CARE PHYSICIAN: Forest Gleason, MD   REQUESTING/REFERRING PHYSICIAN: Kasa  CHIEF COMPLAINT:  No chief complaint on file.   HISTORY OF PRESENT ILLNESS: Jenny Cooper  is a 80 y.o. female with a known history of hypertension, hyperlipidemia, coronary artery disease, lung cancer history: Atrial fibrillation, COPD - she was found to have a new mass in the lung and was scheduled for bronchoscopy by Dr. Mortimer Fries today. Off school. She was hypoxic and having some wheezing so he ordered hospitalist team for admission. On further questioning patient states for last 4 days she'll probably require surgery or Dr.Choksi's office- she was prescribed steroid and doxycycline by him which she is taking for 3 days.  PAST MEDICAL HISTORY:   Past Medical History  Diagnosis Date  . HTN (hypertension)   . Pacemaker     a. MDT 2002; b. generator replacement 2013; c. followed by Dr. Omelia Blackwater, MD  . HLD (hyperlipidemia)   . CAD (coronary artery disease)     a. s/p MI x 2 in 2002 s/p PCI x 2 in 2002; b. s/p 2v CABG 2002; c. stress echo 07/2004 w/ evi of pos & inf infarct & no evi of ischemia; d. 4/08 dipyridamole scan w/ multiple areas of infarct, no ischemia, EF 49%; e. cath 04/28/15 3v CAD, med Rx rec, no targets for revasc, LM lum irregs, pLAD 30%, 100%, ost-pLCx 60%, [name] 99%, OM2 100%, p-mRCA 90%, [name] 100% L-R collats, VG-mLAD irregs, VG-OM2 oc  . Lung cancer ([place])   . History of colonoscopy 2013  . History of mammography, screening 2015  . History of Papanicolaou smear of cervix 2013  . Carcinoma of right lung ([place]) 01/03/2015    a. followed by Dr. Oliva Bustard  . Mitral regurgitation     a. s/p mitral ring placement 09/2000; b. echo 09/2010: EF 50%, inf HK, post AK, mild MR, prosthetic mitral valve ring w/ peak gradient of 10 mmHg; b. echo  2/13: EF 50%, mild MR/TR     . History of blood clots     12/2001  . Atypical atrial flutter ([place])     a. s/p ablation 07/27/2013  . PAF (paroxysmal atrial fibrillation) ([facility])     a. on Eliquis   . COPD (chronic obstructive pulmonary disease) ([place])   . Chronic systolic CHF (congestive heart failure) ([place])     a. echo 03/2015: EF 30-35%, sev ant/inf/pos HK, in mild to mod MR  . Myocardial infarction ([facility])     X 2 (LAST ONE IN 200)  . Hypothyroidism   . GERD (gastroesophageal reflux disease)   . Neuropathy ([place])     PAST SURGICAL HISTORY:  Past Surgical History  Procedure Laterality Date  . Appendectomy    . Vaginal hysterectomy    . Cardiac bypass    . Pacemaker insertion N/A   . Cardiac catheterization N/A 04/28/2015    Procedure: Left Heart Cath and Coronary Angiography;  Surgeon: Wellington Hampshire, MD;  Location: [facility];  Service: Cardiovascular;  Laterality: N/A;    SOCIAL HISTORY:  Social History  Substance Use Topics  . Smoking status: Former Smoker -- 2.00 packs/day for 42 years    Types: Cigarettes    Quit date: 08/30/2000  . Smokeless tobacco: Not on file     Comment: quit  smoking in 08/28/2000  . Alcohol Use: 6.0 oz/week    10 Glasses of wine per week     Comment: 2 glasses of wine per day    FAMILY HISTORY:  Family History  Problem Relation Age of Onset  . COPD Mother     sister, and brother    DRUG ALLERGIES:  Allergies  Allergen Reactions  . Lovenox [Enoxaparin Sodium] Itching  . Meperidine Other (See Comments)    Other Reaction: pt does not like how it makes her feel    REVIEW OF SYSTEMS:   CONSTITUTIONAL: No fever, fatigue or weakness.  EYES: No blurred or double vision.  EARS, NOSE, AND THROAT: No tinnitus or ear pain.  RESPIRATORY: Positive cough, shortness of breath, mild wheezing, no hemoptysis.  CARDIOVASCULAR: No chest pain, orthopnea, edema.  GASTROINTESTINAL: No nausea, vomiting, diarrhea or abdominal pain.   GENITOURINARY: No dysuria, hematuria.  ENDOCRINE: No polyuria, nocturia,  HEMATOLOGY: No anemia, easy bruising or bleeding SKIN: No rash or lesion. MUSCULOSKELETAL: No joint pain or arthritis.   NEUROLOGIC: No tingling, numbness, weakness.  PSYCHIATRY: No anxiety or depression.   MEDICATIONS AT HOME:  Prior to Admission medications   Medication Sig Start Date End Date Taking? Authorizing Provider  apixaban (ELIQUIS) 5 MG TABS tablet Take 5 mg by mouth 2 (two) times daily.   Yes Historical Provider, MD  atorvastatin (LIPITOR) 20 MG tablet Take 20 mg by mouth daily at 6 PM.    Yes Historical Provider, MD  Cetirizine HCl 10 MG CAPS Take 1 capsule (10 mg total) by mouth 1 day or 1 dose. 05/19/15  Yes Ryan M Dunn, PA-C  doxycycline (VIBRA-TABS) 100 MG tablet Take 1 tablet (100 mg total) by mouth 2 (two) times daily. 10/02/15  Yes Forest Gleason, MD  Fluticasone-Salmeterol (ADVAIR DISKUS) 250-50 MCG/DOSE AEPB Inhale 1 puff into the lungs every 12 (twelve) hours. 07/29/15  Yes Forest Gleason, MD  guaiFENesin (MUCINEX) 600 MG 12 hr tablet Take 1 tablet (600 mg total) by mouth 2 (two) times daily. 10/02/15  Yes Forest Gleason, MD  ipratropium-albuterol (DUONEB) 0.5-2.5 (3) MG/3ML SOLN TAKE 3ML BY NEBULIZER EVERY 6 HOURS AS NEEDED 04/07/15  Yes Historical Provider, MD  levothyroxine (SYNTHROID, LEVOTHROID) 75 MCG tablet Take 75 mcg by mouth daily before breakfast.   Yes Historical Provider, MD  metoprolol succinate (TOPROL XL) 25 MG 24 hr tablet Take 1 tablet (25 mg total) by mouth daily. Take '25mg'$  every morning and '50mg'$  every evening 05/19/15  Yes Ryan M Dunn, PA-C  omeprazole (PRILOSEC) 20 MG capsule Take 20 mg by mouth every morning.    Yes Historical Provider, MD  predniSONE (DELTASONE) 5 MG tablet Start '60mg'$  taper by '5mg'$  daily until finished. 10/02/15  Yes Forest Gleason, MD  sacubitril-valsartan (ENTRESTO) 24-26 MG Take 1 tablet by mouth 2 (two) times daily. 04/15/15  Yes Ryan M Dunn, PA-C      PHYSICAL  EXAMINATION:   VITAL SIGNS: Blood pressure 103/79, pulse 25, temperature 97.9 F (36.6 C), temperature source Oral, resp. rate 26, height 5' 7.5" (1.715 m), weight 69.854 kg (154 lb), SpO2 84 %.  GENERAL:  80 y.o.-year-old patient lying in the bed with some acute distress.  EYES: Pupils equal, round, reactive to light and accommodation. No scleral icterus. Extraocular muscles intact.  HEENT: Head atraumatic, normocephalic. Oropharynx and nasopharynx clear.  NECK:  Supple, no jugular venous distention. No thyroid enlargement, no tenderness.  LUNGS: Normal breath sounds bilaterally, expiratory wheezing,no crepitation. Positive use of accessory muscles  of respiration. Requiring supplemental oxygen. CARDIOVASCULAR: S1, S2 normal. No murmurs, rubs, or gallops.  ABDOMEN: Soft, nontender, nondistended. Bowel sounds present. No organomegaly or mass.  EXTREMITIES: No pedal edema, cyanosis, or clubbing.  NEUROLOGIC: Cranial nerves II through XII are intact. Muscle strength 5/5 in all extremities. Sensation intact. Gait not checked.  PSYCHIATRIC: The patient is alert and oriented x 3.  SKIN: No obvious rash, lesion, or ulcer.   LABORATORY PANEL:   CBC No results for input(s): WBC, HGB, HCT, PLT, MCV, MCH, MCHC, RDW, LYMPHSABS, MONOABS, EOSABS, BASOSABS, BANDABS in the last 168 hours.  Invalid input(s): NEUTRABS, BANDSABD ------------------------------------------------------------------------------------------------------------------  Chemistries  No results for input(s): NA, K, CL, CO2, GLUCOSE, BUN, CREATININE, CALCIUM, MG, AST, ALT, ALKPHOS, BILITOT in the last 168 hours.  Invalid input(s): GFRCGP ------------------------------------------------------------------------------------------------------------------ estimated creatinine clearance is 56.3 mL/min (by C-G formula based on Cr of  0.83). ------------------------------------------------------------------------------------------------------------------ No results for input(s): TSH, T4TOTAL, T3FREE, THYROIDAB in the last 72 hours.  Invalid input(s): FREET3   Coagulation profile No results for input(s): INR, PROTIME in the last 168 hours. ------------------------------------------------------------------------------------------------------------------- No results for input(s): DDIMER in the last 72 hours. -------------------------------------------------------------------------------------------------------------------  Cardiac Enzymes No results for input(s): CKMB, TROPONINI, MYOGLOBIN in the last 168 hours.  Invalid input(s): CK ------------------------------------------------------------------------------------------------------------------ Invalid input(s): POCBNP  ---------------------------------------------------------------------------------------------------------------  Urinalysis    Component Value Date/Time   COLORURINE YELLOW* 04/01/2015 1223   APPEARANCEUR HAZY* 04/01/2015 1223   LABSPEC 1.018 04/01/2015 1223   PHURINE 5.0 04/01/2015 1223   GLUCOSEU 50* 04/01/2015 1223   HGBUR NEGATIVE 04/01/2015 1223   BILIRUBINUR NEGATIVE 04/01/2015 1223   KETONESUR NEGATIVE 04/01/2015 1223   PROTEINUR 30* 04/01/2015 1223   NITRITE NEGATIVE 04/01/2015 1223   LEUKOCYTESUR NEGATIVE 04/01/2015 1223     RADIOLOGY: Ct Chest Wo Contrast  10/06/2015  CLINICAL DATA:  80 year old female with cough for the past 2 years. 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 EXAM: CT CHEST WITHOUT CONTRAST TECHNIQUE: Multidetector CT imaging of the chest was performed following the standard protocol without IV contrast. COMPARISON:  Multiple priors, most recently PET-CT 10/01/2015. FINDINGS: Mediastinum/Lymph Nodes: Heart size is borderline enlarged.  Pericardial thickening and calcification anterior to the right ventricle. In addition, there is myocardial thickening involving the inferior and inferolateral wall segments of the left ventricle from the mid ventricle to the base, presumably dystrophic calcification at site of prior left circumflex coronary artery territory myocardial infarction(s). There is atherosclerosis of the thoracic aorta, the great vessels of the mediastinum and the coronary arteries, including calcified atherosclerotic plaque in the left main, left anterior descending, left circumflex and right coronary arteries. There continues to be extensive soft tissue thickening in and around the right hilar region, which is poorly evaluated on today's noncontrast CT examination. This causes severe narrowing at the origin of the right upper lobe bronchus (image 24 of series 2). No other definite mediastinal or left hilar lymphadenopathy noted on today's noncontrast CT examination. Borderline enlarged superior mediastinal lymph node posterolateral to the right side of the trachea (image 7 of series 2), similar to the prior study. Status post median sternotomy for CABG and mitral annuloplasty. Left-sided pacemaker device in position with lead tips terminating in the right atrium and right ventricular apex. Esophagus is unremarkable in appearance. No axillary lymphadenopathy. Right internal jugular single-lumen porta cath with tip terminating in the distal superior vena cava. Lungs/Pleura: Chronic right-sided volume loss and architectural distortion most evident in the perihilar aspect of  the right lung, particularly in the right upper lobe, compatible with an area of postradiation mass-like fibrosis. Radiating outward from this central area there are extensive areas of ground-glass attenuation and septal thickening throughout the right lung, most evident in the superior aspect of the right lower lobe and in the right upper lobe. These findings appear  very similar to the recent head CT from 10/01/2015, but have clearly increased compared to prior chest CT 05/26/2015. In addition, there is again multifocal nodular and mass-like areas of airspace consolidation throughout the lungs bilaterally, but most severe in the left lung, largest of which is in the left lower lobe measuring 3.3 x 4.7 cm (image 44 of series 4). These findings appear essentially unchanged compared to the recent PET-CT. Trace right partially loculated pleural effusion the mid right hemithorax is unchanged. No left pleural effusion. Upper Abdomen: Unremarkable. Musculoskeletal/Soft Tissues: Median sternotomy wires. There are no aggressive appearing lytic or blastic lesions noted in the visualized portions of the skeleton. IMPRESSION: 1. The appearance of the chest is very similar to recent PET-CT 10/01/2015, as detailed above. Given that radiation therapy was completed greater than 6 months ago, findings on today's examination are highly unlikely to be a post treatment related change. Given the interval enlargement of the area of chronic architectural distortion in the perihilar aspect of the right upper lobe noted on prior PET-CT which demonstrated some low-level hypermetabolism, the possibility of local recurrence of disease is not excluded. Accordingly, the findings in the perihilar aspect of the right lung radiating outward could reflect evolving lymphangitic spread of tumor. 2. In addition, the other multifocal nodular and mass-like areas of airspace consolidation throughout the lungs bilaterally are highly concerning for atypical infectious process, potentially fungal pneumonia. Clinical correlation is recommended. The possibility of new metastatic disease throughout the lungs bilaterally is not excluded, but is less favored given the relatively rapid development between examinations from 05/26/2015 and 10/01/2015. 3. Trace right partially loculated pleural effusion is unchanged. 4.  Atherosclerosis, including left main and 3 vessels coronary artery disease. Status post median sternotomy for CABG as well as mitral annuloplasty. Electronically Signed   By: Vinnie Langton M.D.   On: 10/06/2015 09:44   Dg C-arm 1-60 Min-no Report  10/06/2015  CLINICAL DATA: ENB C-ARM 1-60 MINUTES Fluoroscopy was utilized by the requesting physician.  No radiographic interpretation.   IMPRESSION AND PLAN:  * Acute respiratory failure with hypoxia secondary to COPD exacerbation.   Continue supplemental oxygen, give IV steroids and nebulizer therapy.   She was already on oral steroid antibodies as outpatient, I will give Levaquin over here.  * Atrial fibrillation   Rate is controlled, on eliquis, continue.  * Htn   Cont Metoprolol. Entresto.  * Lung mass   Manage per Cancer center.  All the records are reviewed and case discussed with ED provider. Management plans discussed with the patient, family and they are in agreement.  CODE STATUS: Code Status History    Date Active Date Inactive Code Status Order ID Comments User Context   04/28/2015  1:35 PM 04/28/2015  6:58 PM Full Code [id number]  Wellington Hampshire, MD Inpatient   04/01/2015  2:19 PM 04/07/2015  5:36 PM Full Code [id number]  Max Sane, MD Inpatient       TOTAL TIME TAKING CARE OF THIS PATIENT: 50 minutes.    Vaughan Basta M.D on 10/06/2015   Between 7am to 6pm - Pager - [phone]  After 6pm go to [email or web address] - password  [facility]  [facility]  Office  [phone]  CC: Primary care physician; Forest Gleason, MD   Note: This dictation was prepared with Dragon dictation along with smaller phrase technology. Any transcriptional errors that result from this process are unintentional.

## 2015-10-06 NOTE — Transfer of Care (Signed)
Immediate Anesthesia Transfer of Care Note  Patient: Jenny Cooper  Procedure(s) Performed: Procedure(s): ENDOBRONCHIAL ULTRASOUND (N/A) ELECTROMAGNETIC NAVIGATION BRONCHOSCOPY (N/A)  Patient Location: PACU  Anesthesia Type:General  Level of Consciousness: awake, alert , oriented and patient cooperative  Airway & Oxygen Therapy: Patient Spontanous Breathing and non-rebreather face mask  Post-op Assessment: Report given to RN, Post -op Vital signs reviewed and stable and Patient moving all extremities X 4  Post vital signs: Reviewed and stable  Last Vitals:  Filed Vitals:   10/06/15 0852  BP: 127/81  Pulse: 63  Temp: 36.6 C  Resp: 18    Complications: No apparent anesthesia complications

## 2015-10-06 NOTE — Progress Notes (Signed)
Pt is started on Bipap, so will admit to stepdown unit.

## 2015-10-06 NOTE — Op Note (Signed)
PROCEDURE: ENDOBRONCHIAL ULTRASOUND   PROCEDURE DATE: 10/06/2015  TIME:  NAME:  Jenny Cooper  DOB:12-18-1935  MRN: [id number] LOC:  ARPO/None    HOSP DAY: '@LENGTHOFSTAYDAYS'$ @ CODE STATUS:   Code Status History    Date Active Date Inactive Code Status Order ID Comments User Context   04/28/2015  1:35 PM 04/28/2015  6:58 PM Full Code [id number]  Wellington Hampshire, MD Inpatient   04/01/2015  2:19 PM 04/07/2015  5:36 PM Full Code [id number]  Max Sane, MD Inpatient          Indications/Preliminary Diagnosis:   Consent: (Place X beside choice/s below)  The benefits, risks and possible complications of the procedure were        explained to:  _x__ patient  _x__ patient's family  ___ other:___________  who verbalized understanding and gave:  ___ verbal  ___ written  _x__ verbal and written  ___ telephone  ___ other:________ consent.      Unable to obtain consent; procedure performed on emergent basis.     Other:       PRESEDATION ASSESSMENT: History and Physical has been performed. Patient meds and allergies have been reviewed. Presedation airway examination has been performed and documented. Baseline vital signs, sedation score, oxygenation status, and cardiac rhythm were reviewed. Patient was deemed to be in satisfactory condition to undergo the procedure.    PREMEDICATIONS: SEE ANESTHESIOLOGY RECORDS   Airway Prep (Place X beside choice below)   1% Transtracheal Lidocaine Anesthetization 7 cc   Patient prepped per Bronchoscopy Lab Policy       Insertion Route (Place X beside choice below)   Nasal   Oral  x Endotracheal Tube   Tracheostomy   INTRAPROCEDURE MEDICATIONS: SEE ANESTHESIOLOGY RECORDS   PROCEDURE DETAILS: Timeout performed and correct patient, name, & ID confirmed. Following prep per Pulmonary policy, appropriate sedation was administered.  Airway exam proceeded with findings, technical procedures, and specimen collection as noted below. At the end of exam the  scope was withdrawn without incident. Impression and Plan as noted below.   I was able to visulize a large RT hilar density on ultrasound view approx 3x4 CM with areas of blood vessels The underlyiing mucosa was inflammed and edemaotous       TECHNICAL PROCEDURES: (Place X beside choice below)   Procedures  Description    None     Electrocautery     Cryotherapy     Balloon Dilatation     Bronchography     Stent Placement     Therapeutic Aspiration     Laser/Argon Plasma    Brachytherapy Catheter Placement    Foreign Body Removal     SPECIMENS (Sites): (Place X beside choice below)  Specimens Description   No Specimens Obtained     Washings    Lavage    Biopsies   x Fine Needle Aspirates 4   Brushings    Sputum    FINDINGS: large dense RT hilar mass  ESTIMATED BLOOD LOSS: none COMPLICATIONS/RESOLUTION: none       IMPRESSION:POST-PROCEDURE DX: RECOMMENDATION/PLAN:  Follow up pathology reports     Aiman Sonn Patricia Pesa, M.D.  [facility]  Medical Director [place] Director [facility] Department

## 2015-10-06 NOTE — Op Note (Signed)
Electromagnetic Navigation Bronchoscopy: Indication: lung mass  Preoperative Diagnosis:lung mass Post Procedure Diagnosis: lung mass Consent: written/verbal Risks and benefits explained in detail including risk of infection, bleeding, respiratory failure and death.   Hand washing performed prior to starting the procedure.   Type of Anesthesia: see Anesthesiology records .   Procedure Performed:  Virtual Bronchoscopy with Multi-planar Image analysis, 3-D reconstruction of coronal, sagittal and multi-planar images for the purposes of planning real-time bronchoscopy using the i[employer] Electromagnetic Navigation Bronchoscopy System (superDimension)..   Description of Procedure: After obtaining informed consent from the patient, the above sedative and anesthetic measures were carried out, flexible fiberoptic bronchoscope was inserted via an oral bite block. Posterior pharynx was clear. The 2 vocal cords were easily traversed after application of local anesthetic.  The virtual camera was then placed into the central portion of the trachea. The trachea itself was inspected.  The main carina, right and left midstem bronchus and all the segmental and subsegmental airways by virtual bronchoscopy were brieftly inspected.  The camera was directed to standard registration points at the following centers: main carina, right upper lobe bronchus, right lower lobe bronchus, right middle lobe bronchus, left upper lobe bronchus, and the left lower lobe bronchus. This data was transferred to the i-Logic ENB system for real-time bronchoscopy.   Specimans Obtained:The Scope was navigated to the LLL lung mass after several attempt of manual registration  Fine Needle Aspirations 21G times:3  Forceps Biopsy times:6  Triple Needle Brush:2    Fluoroscopy:  Fluoroscopy was utilized during the course of this procedure to assure that biopsies were taken in a safe manner under fluoroscopic guidance with no spot films  required.   Complications:none  Estimated Blood Loss: none  Monitoring:  The patient was monitored with continuous oximetry and received supplemental nasal cannula oxygen throughout the procedure. In addition, serial blood pressure measurements and continuous electrocardiography showed these physiologic parameters to remain tolerable throughout the procedure.   Assessment and Plan/Additional Comments:follow up pathology reports    Corrin Parker, M.D.  [facility]  Medical Director [place] Director [facility] Cardio-Pulmonary Department

## 2015-10-07 ENCOUNTER — Encounter: Payer: Self-pay | Admitting: Internal Medicine

## 2015-10-07 LAB — BASIC METABOLIC PANEL
ANION GAP: 8 (ref 5–15)
BUN: 25 mg/dL — ABNORMAL HIGH (ref 6–20)
CALCIUM: 9.5 mg/dL (ref 8.9–10.3)
CHLORIDE: 102 mmol/L (ref 101–111)
CO2: 29 mmol/L (ref 22–32)
Creatinine, Ser: 1.02 mg/dL — ABNORMAL HIGH (ref 0.44–1.00)
GFR calc Af Amer: 60 mL/min — ABNORMAL LOW (ref >60)
GFR calc non Af Amer: 52 mL/min — ABNORMAL LOW (ref >60)
GLUCOSE: 165 mg/dL — AB (ref 65–99)
Potassium: 4.5 mmol/L (ref 3.5–5.1)
Sodium: 139 mmol/L (ref 135–145)

## 2015-10-07 LAB — CBC
HCT: 33.7 % — ABNORMAL LOW (ref 35.0–47.0)
HEMOGLOBIN: 10.7 g/dL — AB (ref 12.0–16.0)
MCH: 27 pg (ref 26.0–34.0)
MCHC: 31.7 g/dL — AB (ref 32.0–36.0)
MCV: 85.1 fL (ref 80.0–100.0)
Platelets: 266 10*3/uL (ref 150–440)
RBC: 3.96 MIL/uL (ref 3.80–5.20)
RDW: 20.2 % — ABNORMAL HIGH (ref 11.5–14.5)
WBC: 5.6 10*3/uL (ref 3.6–11.0)

## 2015-10-07 LAB — CYTOLOGY - NON PAP

## 2015-10-07 LAB — SURGICAL PATHOLOGY

## 2015-10-07 MED ORDER — TIOTROPIUM BROMIDE MONOHYDRATE 18 MCG IN CAPS: 18.0000 ug | ORAL_CAPSULE | Freq: Every day | RESPIRATORY_TRACT | Status: DC

## 2015-10-07 MED ORDER — CEPASTAT 14.5 MG MT LOZG: 1.0000 | LOZENGE | OROMUCOSAL | Status: DC | PRN

## 2015-10-07 MED ORDER — BUDESONIDE 0.25 MG/2ML IN SUSP: 0.2500 mg | Freq: Two times a day (BID) | RESPIRATORY_TRACT | Status: DC

## 2015-10-07 MED ORDER — CETYLPYRIDINIUM CHLORIDE 0.05 % MT LIQD
7.0000 mL | Freq: Two times a day (BID) | OROMUCOSAL | Status: DC
  Administered 2015-10-07: 7 mL via OROMUCOSAL

## 2015-10-07 MED ORDER — PREDNISONE 5 MG PO TABS
ORAL_TABLET | ORAL | Status: DC
Start: 2015-10-07 — End: 2015-10-09

## 2015-10-07 NOTE — Discharge Summary (Addendum)
[facility] NAME: Jenny Cooper    MR#:  [id number]  DATE OF BIRTH:  August 02, 1936  DATE OF ADMISSION:  10/06/2015 ADMITTING PHYSICIAN: Flora Lipps, MD  DATE OF DISCHARGE: 10/07/2015  PRIMARY CARE PHYSICIAN: Forest Gleason, MD    ADMISSION DIAGNOSIS:  m  Hypoxia  DISCHARGE DIAGNOSIS:  Principal Problem:   COPD exacerbation ([place]) Active Problems:   Lung mass Transient Hypoxia likely due to effect of anesthesia with underlying COPD  SECONDARY DIAGNOSIS:   Past Medical History  Diagnosis Date  . HTN (hypertension)   . Pacemaker     a. MDT 2002; b. generator replacement 2013; c. followed by Dr. Omelia Blackwater, MD  . HLD (hyperlipidemia)   . CAD (coronary artery disease)     a. s/p MI x 2 in 2002 s/p PCI x 2 in 2002; b. s/p 2v CABG 2002; c. stress echo 07/2004 w/ evi of pos & inf infarct & no evi of ischemia; d. 4/08 dipyridamole scan w/ multiple areas of infarct, no ischemia, EF 49%; e. cath 04/28/15 3v CAD, med Rx rec, no targets for revasc, LM lum irregs, pLAD 30%, 100%, ost-pLCx 60%, [name] 99%, OM2 100%, p-mRCA 90%, [name] 100% L-R collats, VG-mLAD irregs, VG-OM2 oc  . Lung cancer ([place])   . History of colonoscopy 2013  . History of mammography, screening 2015  . History of Papanicolaou smear of cervix 2013  . Carcinoma of right lung ([place]) 01/03/2015    a. followed by Dr. Oliva Bustard  . Mitral regurgitation     a. s/p mitral ring placement 09/2000; b. echo 09/2010: EF 50%, inf HK, post AK, mild MR, prosthetic mitral valve ring w/ peak gradient of 10 mmHg; b. echo 2/13: EF 50%, mild MR/TR     . History of blood clots     12/2001  . Atypical atrial flutter ([place])     a. s/p ablation 07/27/2013  . PAF (paroxysmal atrial fibrillation) (H[facility])     a. on Eliquis   . COPD (chronic obstructive pulmonary disease) ([place])   . Chronic systolic CHF (congestive heart failure) ([place])     a. echo 03/2015: EF 30-35%, sev ant/inf/pos HK, in mild to mod MR  .  Myocardial infarction (HCC)     X 2 (LAST ONE IN 200)  . Hypothyroidism   . GERD (gastroesophageal reflux disease)   . Neuropathy [facility])    HOSPITAL COURSE:  80 y.o. female with a known history of hypertension, hyperlipidemia, coronary artery disease, lung cancer history: Atrial fibrillation, COPD - she was found to have a new mass in the lung and was scheduled for bronchoscopy by Dr. Mortimer Fries which was performed on 2/6 after which she was found hypoxic and Hospitalist were called for admission.  Please see Dr Nichola Sizer dictated history and physical for further details.  Patient was placed in ICU as she initially required BiPAP as stepdown status.  She was started on empiric antibiotics and steroid taper.  She responded well to that regimen.  She has been weaned off BiPAP and also nasal cannula oxygen.  Her oxygenation has remained above 90% at rest and also on ambulation without any need of oxygen.  Her daughter was at bedside.  They are agreeable with the discharge plans and she is being discharged in stable condition. DISCHARGE CONDITIONS:   STABLE  CONSULTS OBTAINED:   PULMO - DR KASA  DRUG ALLERGIES:   Allergies  Allergen Reactions  . Lovenox [Enoxaparin Sodium] Itching  .  Meperidine Other (See Comments)    Other Reaction: pt does not like how it makes her feel    DISCHARGE MEDICATIONS:   Current Discharge Medication List    START taking these medications   Details  phenol-menthol (CEPASTAT) 14.5 MG lozenge Place 1 lozenge inside cheek as needed for sore throat. Qty: 100 tablet, Refills: 0    tiotropium (SPIRIVA) 18 MCG inhalation capsule Place 1 capsule (18 mcg total) into inhaler and inhale daily. Qty: 30 capsule, Refills: 12      CONTINUE these medications which have CHANGED   Details  predniSONE (DELTASONE) 5 MG tablet Start '60mg'$  taper by '5mg'$  daily until finished. Qty: 78 tablet, Refills: 0   Associated Diagnoses: Carcinoma of right lung ([place])      CONTINUE these  medications which have NOT CHANGED   Details  apixaban (ELIQUIS) 5 MG TABS tablet Take 5 mg by mouth 2 (two) times daily.    atorvastatin (LIPITOR) 20 MG tablet Take 20 mg by mouth daily at 6 PM.     Cetirizine HCl 10 MG CAPS Take 1 capsule (10 mg total) by mouth 1 day or 1 dose. Qty: 30 capsule, Refills: 5    doxycycline (VIBRA-TABS) 100 MG tablet Take 1 tablet (100 mg total) by mouth 2 (two) times daily. Qty: 20 tablet, Refills: 0   Associated Diagnoses: Carcinoma of right lung (HCC)    Fluticasone-Salmeterol (ADVAIR DISKUS) 250-50 MCG/DOSE AEPB Inhale 1 puff into the lungs every 12 (twelve) hours. Qty: 60 each, Refills: 0   Associated Diagnoses: Carcinoma of right lung (HCC)    guaiFENesin (MUCINEX) 600 MG 12 hr tablet Take 1 tablet (600 mg total) by mouth 2 (two) times daily. Qty: 30 tablet, Refills: 0   Associated Diagnoses: Carcinoma of right lung (HCC)    ipratropium-albuterol (DUONEB) 0.5-2.5 (3) MG/3ML SOLN TAKE 3ML BY NEBULIZER EVERY 6 HOURS AS NEEDED Refills: 2   Associated Diagnoses: Carcinoma of right lung (HCC)    levothyroxine (SYNTHROID, LEVOTHROID) 75 MCG tablet Take 75 mcg by mouth daily before breakfast.    metoprolol succinate (TOPROL XL) 25 MG 24 hr tablet Take 1 tablet (25 mg total) by mouth daily. Take '25mg'$  every morning and '50mg'$  every evening Qty: 90 tablet, Refills: 5    omeprazole (PRILOSEC) 20 MG capsule Take 20 mg by mouth every morning.     sacubitril-valsartan (ENTRESTO) 24-26 MG Take 1 tablet by mouth 2 (two) times daily. Qty: 60 tablet, Refills: 5         DISCHARGE INSTRUCTIONS:    DIET:  Regular diet  DISCHARGE CONDITION:  Good  ACTIVITY:  Activity as tolerated  OXYGEN:  Home Oxygen: No.   Oxygen Delivery: room air  DISCHARGE LOCATION:  home   If you experience worsening of your admission symptoms, develop shortness of breath, life threatening emergency, suicidal or homicidal thoughts you must seek medical attention  immediately by calling 911 or calling your MD immediately  if symptoms less severe.  You Must read complete instructions/literature along with all the possible adverse reactions/side effects for all the Medicines you take and that have been prescribed to you. Take any new Medicines after you have completely understood and accpet all the possible adverse reactions/side effects.   Please note  You were cared for by a hospitalist during your hospital stay. If you have any questions about your discharge medications or the care you received while you were in the hospital after you are discharged, you can call the unit and asked  to speak with the hospitalist on call if the hospitalist that took care of you is not available. Once you are discharged, your primary care physician will handle any further medical issues. Please note that NO REFILLS for any discharge medications will be authorized once you are discharged, as it is imperative that you return to your primary care physician (or establish a relationship with a primary care physician if you do not have one) for your aftercare needs so that they can reassess your need for medications and monitor your lab values.    On the day of Discharge:  VITAL SIGNS:  Blood pressure 118/80, pulse 103, temperature 97.5 F (36.4 C), temperature source Oral, resp. rate 20, height 5' 7.5" (1.715 m), weight 69.854 kg (154 lb), SpO2 92 %.  PHYSICAL EXAMINATION:  GENERAL:  80 y.o.-year-old patient lying in the bed with no acute distress.  EYES: Pupils equal, round, reactive to light and accommodation. No scleral icterus. Extraocular muscles intact.  HEENT: Head atraumatic, normocephalic. Oropharynx and nasopharynx clear.  NECK:  Supple, no jugular venous distention. No thyroid enlargement, no tenderness.  LUNGS: Normal breath sounds bilaterally, no wheezing, rales,rhonchi or crepitation. No use of accessory muscles of respiration.  CARDIOVASCULAR: S1, S2 normal. No  murmurs, rubs, or gallops.  ABDOMEN: Soft, non-tender, non-distended. Bowel sounds present. No organomegaly or mass.  EXTREMITIES: No pedal edema, cyanosis, or clubbing.  NEUROLOGIC: Cranial nerves II through XII are intact. Muscle strength 5/5 in all extremities. Sensation intact. Gait not checked.  PSYCHIATRIC: The patient is alert and oriented x 3.  SKIN: No obvious rash, lesion, or ulcer.  DATA REVIEW:   CBC  Recent Labs Lab 10/07/15 0821  WBC 5.6  HGB 10.7*  HCT 33.7*  PLT 266    Chemistries   Recent Labs Lab 10/07/15 0821  NA 139  K 4.5  CL 102  CO2 29  GLUCOSE 165*  BUN 25*  CREATININE 1.02*  CALCIUM 9.5    Cardiac Enzymes No results for input(s): TROPONINI in the last 168 hours.  Microbiology Results  Results for orders placed or performed during the hospital encounter of 10/06/15  MRSA PCR Screening     Status: None   Collection Time: 10/06/15  4:33 PM  Result Value Ref Range Status   MRSA by PCR NEGATIVE NEGATIVE Final    Comment:        The GeneXpert MRSA Assay (FDA approved for NASAL specimens only), is one component of a comprehensive MRSA colonization surveillance program. It is not intended to diagnose MRSA infection nor to guide or monitor treatment for MRSA infections.     RADIOLOGY:  Ct Chest Wo Contrast  10/06/2015  CLINICAL DATA:  80 year old female with cough for the past 2 years. 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 EXAM: CT CHEST WITHOUT CONTRAST TECHNIQUE: Multidetector CT imaging of the chest was performed following the standard protocol without IV contrast. COMPARISON:  Multiple priors, most recently PET-CT 10/01/2015. FINDINGS: Mediastinum/Lymph Nodes: Heart size is borderline enlarged. Pericardial thickening and calcification anterior to the right ventricle. In addition, there is myocardial thickening involving the inferior and inferolateral wall  segments of the left ventricle from the mid ventricle to the base, presumably dystrophic calcification at site of prior left circumflex coronary artery territory myocardial infarction(s). There is atherosclerosis of the thoracic aorta, the great vessels of the mediastinum and the coronary arteries, including calcified atherosclerotic plaque in the left main, left anterior  descending, left circumflex and right coronary arteries. There continues to be extensive soft tissue thickening in and around the right hilar region, which is poorly evaluated on today's noncontrast CT examination. This causes severe narrowing at the origin of the right upper lobe bronchus (image 24 of series 2). No other definite mediastinal or left hilar lymphadenopathy noted on today's noncontrast CT examination. Borderline enlarged superior mediastinal lymph node posterolateral to the right side of the trachea (image 7 of series 2), similar to the prior study. Status post median sternotomy for CABG and mitral annuloplasty. Left-sided pacemaker device in position with lead tips terminating in the right atrium and right ventricular apex. Esophagus is unremarkable in appearance. No axillary lymphadenopathy. Right internal jugular single-lumen porta cath with tip terminating in the distal superior vena cava. Lungs/Pleura: Chronic right-sided volume loss and architectural distortion most evident in the perihilar aspect of the right lung, particularly in the right upper lobe, compatible with an area of postradiation mass-like fibrosis. Radiating outward from this central area there are extensive areas of ground-glass attenuation and septal thickening throughout the right lung, most evident in the superior aspect of the right lower lobe and in the right upper lobe. These findings appear very similar to the recent head CT from 10/01/2015, but have clearly increased compared to prior chest CT 05/26/2015. In addition, there is again multifocal nodular  and mass-like areas of airspace consolidation throughout the lungs bilaterally, but most severe in the left lung, largest of which is in the left lower lobe measuring 3.3 x 4.7 cm (image 44 of series 4). These findings appear essentially unchanged compared to the recent PET-CT. Trace right partially loculated pleural effusion the mid right hemithorax is unchanged. No left pleural effusion. Upper Abdomen: Unremarkable. Musculoskeletal/Soft Tissues: Median sternotomy wires. There are no aggressive appearing lytic or blastic lesions noted in the visualized portions of the skeleton. IMPRESSION: 1. The appearance of the chest is very similar to recent PET-CT 10/01/2015, as detailed above. Given that radiation therapy was completed greater than 6 months ago, findings on today's examination are highly unlikely to be a post treatment related change. Given the interval enlargement of the area of chronic architectural distortion in the perihilar aspect of the right upper lobe noted on prior PET-CT which demonstrated some low-level hypermetabolism, the possibility of local recurrence of disease is not excluded. Accordingly, the findings in the perihilar aspect of the right lung radiating outward could reflect evolving lymphangitic spread of tumor. 2. In addition, the other multifocal nodular and mass-like areas of airspace consolidation throughout the lungs bilaterally are highly concerning for atypical infectious process, potentially fungal pneumonia. Clinical correlation is recommended. The possibility of new metastatic disease throughout the lungs bilaterally is not excluded, but is less favored given the relatively rapid development between examinations from 05/26/2015 and 10/01/2015. 3. Trace right partially loculated pleural effusion is unchanged. 4. Atherosclerosis, including left main and 3 vessels coronary artery disease. Status post median sternotomy for CABG as well as mitral annuloplasty. Electronically Signed    By: Vinnie Langton M.D.   On: 10/06/2015 09:44   Dg C-arm 1-60 Min-no Report  10/06/2015  CLINICAL DATA: ENB C-ARM 1-60 MINUTES Fluoroscopy was utilized by the requesting physician.  No radiographic interpretation.     Management plans discussed with the patient, family and they are in agreement.  CODE STATUS: Full code  TOTAL TIME TAKING CARE OF THIS PATIENT: 55 minutes.    [facility], Nadege Carriger M.D on 10/07/2015 at 2:01 PM  Between 7am  to 6pm - Pager - [phone]  After 6pm go to [email or web address] - password [email or web address] [place] [facility]  Office  [phone]  CC: Primary care physician; Forest Gleason, MD Flora Lipps, MD  Note: This dictation was prepared with Dragon dictation along with smaller phrase technology. Any transcriptional errors that result from this process are unintentional.

## 2015-10-07 NOTE — Discharge Instructions (Signed)
Chronic Obstructive Pulmonary Disease °Chronic obstructive pulmonary disease (COPD) is a common lung problem. In COPD, the flow of air from the lungs is limited. The way your lungs work will probably never return to normal, but there are things you can do to improve your lungs and make yourself feel better. Your doctor may treat your condition with: °· Medicines. °· Oxygen. °· Lung surgery. °· Changes to your diet. °· Rehabilitation. This may involve a team of specialists. °HOME CARE °· Take all medicines as told by your doctor. °· Avoid medicines or cough syrups that dry up your airway (such as antihistamines) and do not allow you to get rid of thick spit. You do not need to avoid them if told differently by your doctor. °· If you smoke, stop. Smoking makes the problem worse. °· Avoid being around things that make your breathing worse (like smoke, chemicals, and fumes). °· Use oxygen therapy and therapy to help improve your lungs (pulmonary rehabilitation) if told by your doctor. If you need home oxygen therapy, ask your doctor if you should buy a tool to measure your oxygen level (oximeter). °· Avoid people who have a sickness you can catch (contagious). °· Avoid going outside when it is very hot, cold, or humid. °· Eat healthy foods. Eat smaller meals more often. Rest before meals. °· Stay active, but remember to also rest. °· Make sure to get all the shots (vaccines) your doctor recommends. Ask your doctor if you need a pneumonia shot. °· Learn and use tips on how to relax. °· Learn and use tips on how to control your breathing as told by your doctor. Try: °¨ Breathing in (inhaling) through your nose for 1 second. Then, pucker your lips and breath out (exhale) through your lips for 2 seconds. °¨ Putting one hand on your belly (abdomen). Breathe in slowly through your nose for 1 second. Your hand on your belly should move out. Pucker your lips and breathe out slowly through your lips. Your hand on your belly  should move in as you breathe out. °· Learn and use controlled coughing to clear thick spit from your lungs. The steps are: °1. Lean your head a little forward. °2. Breathe in deeply. °3. Try to hold your breath for 3 seconds. °4. Keep your mouth slightly open while coughing 2 times. °5. Spit any thick spit out into a tissue. °6. Rest and do the steps again 1 or 2 times as needed. °GET HELP IF: °· You cough up more thick spit than usual. °· There is a change in the color or thickness of the spit. °· It is harder to breathe than usual. °· Your breathing is faster than usual. °GET HELP RIGHT AWAY IF: °· You have shortness of breath while resting. °· You have shortness of breath that stops you from: °¨ Being able to talk. °¨ Doing normal activities. °· You chest hurts for longer than 5 minutes. °· Your skin color is more blue than usual. °· Your pulse oximeter shows that you have low oxygen for longer than 5 minutes. °MAKE SURE YOU: °· Understand these instructions. °· Will watch your condition. °· Will get help right away if you are not doing well or get worse. °  °This information is not intended to replace advice given to you by your health care provider. Make sure you discuss any questions you have with your health care provider. °  °Document Released: 02/02/2008 Document Revised: 09/06/2014 Document Reviewed: 04/12/2013 °[employer]vier Interactive Patient   Education ©2016 [employer] ° °

## 2015-10-09 ENCOUNTER — Inpatient Hospital Stay (HOSPITAL_BASED_OUTPATIENT_CLINIC_OR_DEPARTMENT_OTHER): Payer: Medicare Other | Admitting: Oncology

## 2015-10-09 VITALS — BP 138/95 | HR 103 | Temp 95.9°F | Wt 161.4 lb

## 2015-10-09 DIAGNOSIS — R05 Cough: Secondary | ICD-10-CM

## 2015-10-09 DIAGNOSIS — Z952 Presence of prosthetic heart valve: Secondary | ICD-10-CM | POA: Diagnosis not present

## 2015-10-09 DIAGNOSIS — F419 Anxiety disorder, unspecified: Secondary | ICD-10-CM | POA: Diagnosis not present

## 2015-10-09 DIAGNOSIS — Z87891 Personal history of nicotine dependence: Secondary | ICD-10-CM | POA: Diagnosis not present

## 2015-10-09 DIAGNOSIS — R531 Weakness: Secondary | ICD-10-CM | POA: Diagnosis not present

## 2015-10-09 DIAGNOSIS — R509 Fever, unspecified: Secondary | ICD-10-CM | POA: Diagnosis not present

## 2015-10-09 DIAGNOSIS — J449 Chronic obstructive pulmonary disease, unspecified: Secondary | ICD-10-CM

## 2015-10-09 DIAGNOSIS — I2510 Atherosclerotic heart disease of native coronary artery without angina pectoris: Secondary | ICD-10-CM | POA: Diagnosis not present

## 2015-10-09 DIAGNOSIS — R059 Cough, unspecified: Secondary | ICD-10-CM

## 2015-10-09 DIAGNOSIS — C771 Secondary and unspecified malignant neoplasm of intrathoracic lymph nodes: Secondary | ICD-10-CM | POA: Diagnosis not present

## 2015-10-09 DIAGNOSIS — Z9861 Coronary angioplasty status: Secondary | ICD-10-CM | POA: Diagnosis not present

## 2015-10-09 DIAGNOSIS — Z7952 Long term (current) use of systemic steroids: Secondary | ICD-10-CM | POA: Diagnosis not present

## 2015-10-09 DIAGNOSIS — I10 Essential (primary) hypertension: Secondary | ICD-10-CM | POA: Diagnosis not present

## 2015-10-09 DIAGNOSIS — I252 Old myocardial infarction: Secondary | ICD-10-CM | POA: Diagnosis not present

## 2015-10-09 DIAGNOSIS — R093 Abnormal sputum: Secondary | ICD-10-CM | POA: Diagnosis not present

## 2015-10-09 DIAGNOSIS — Z950 Presence of cardiac pacemaker: Secondary | ICD-10-CM | POA: Diagnosis not present

## 2015-10-09 DIAGNOSIS — E785 Hyperlipidemia, unspecified: Secondary | ICD-10-CM | POA: Diagnosis not present

## 2015-10-09 DIAGNOSIS — Z951 Presence of aortocoronary bypass graft: Secondary | ICD-10-CM | POA: Diagnosis not present

## 2015-10-09 DIAGNOSIS — I480 Paroxysmal atrial fibrillation: Secondary | ICD-10-CM

## 2015-10-09 DIAGNOSIS — G620 Drug-induced polyneuropathy: Secondary | ICD-10-CM | POA: Diagnosis not present

## 2015-10-09 DIAGNOSIS — J440 Chronic obstructive pulmonary disease with acute lower respiratory infection: Secondary | ICD-10-CM | POA: Diagnosis not present

## 2015-10-09 DIAGNOSIS — C3491 Malignant neoplasm of unspecified part of right bronchus or lung: Secondary | ICD-10-CM

## 2015-10-09 DIAGNOSIS — T451X5S Adverse effect of antineoplastic and immunosuppressive drugs, sequela: Secondary | ICD-10-CM | POA: Diagnosis not present

## 2015-10-09 DIAGNOSIS — L658 Other specified nonscarring hair loss: Secondary | ICD-10-CM | POA: Diagnosis not present

## 2015-10-09 DIAGNOSIS — R0600 Dyspnea, unspecified: Secondary | ICD-10-CM

## 2015-10-09 DIAGNOSIS — Z9221 Personal history of antineoplastic chemotherapy: Secondary | ICD-10-CM | POA: Diagnosis not present

## 2015-10-09 DIAGNOSIS — Z79899 Other long term (current) drug therapy: Secondary | ICD-10-CM | POA: Diagnosis not present

## 2015-10-09 DIAGNOSIS — C3401 Malignant neoplasm of right main bronchus: Secondary | ICD-10-CM | POA: Diagnosis present

## 2015-10-09 DIAGNOSIS — Z923 Personal history of irradiation: Secondary | ICD-10-CM

## 2015-10-09 DIAGNOSIS — Z7901 Long term (current) use of anticoagulants: Secondary | ICD-10-CM | POA: Diagnosis not present

## 2015-10-09 DIAGNOSIS — R5383 Other fatigue: Secondary | ICD-10-CM | POA: Diagnosis not present

## 2015-10-09 DIAGNOSIS — I5022 Chronic systolic (congestive) heart failure: Secondary | ICD-10-CM | POA: Diagnosis not present

## 2015-10-09 MED ORDER — LEVOFLOXACIN 500 MG PO TABS: 500.0000 mg | ORAL_TABLET | Freq: Every day | ORAL | Status: DC

## 2015-10-09 NOTE — Progress Notes (Signed)
Patient here for CT and PET results.  Patient is concerned today because her face is red and she states it feels warm.  Concerned that it is caused by a reaction to her meds.

## 2015-10-10 ENCOUNTER — Encounter: Payer: Self-pay | Admitting: Oncology

## 2015-10-11 ENCOUNTER — Encounter: Payer: Self-pay | Admitting: Oncology

## 2015-10-11 NOTE — Progress Notes (Signed)
[place] @ [facility] [phone]  [phone]     Jenny Cooper OB: 1936-03-08  MR#: [id number]  PYP#:[id number]  Patient Care Team: Jenny Gleason, MD as PCP - General (Oncology) Jenny Merritts, MD as Consulting Physician (Cardiology) Jenny Boehringer, MD as Consulting Physician (Internal Medicine)  CHIEF COMPLAINT:  Chief Complaint  Patient presents with  . 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   Patient had compression of the right mainstem bronchus because of enlarged lymph node and a mass. Stage is T4 N1 M0 tumor stage IIIa diagnosis in January of 2016 2.started on radiation and chemotherapy from October 21, 2014 3.finished 6 cycles of carboplatinum and Taxol  in March  29 th of 2016, As finished concurrent chemoradiation therapy and PET scan shows significant response 4. started on consolidation chemotherapy with carboplatinum and Taxol 2 cycle   Patient finished 2 cycles of consultation therapy on March 06, 2015   5.  Atrial fibrillation diagnosis in August of 2016 on  eloquis 6.  Repeat bronchoscopy was negative for any malignancy      INTERVAL HISTORY: 80 year old lady was finished chemoradiation therapy came today further follow-up regarding carcinoma of lung.   Patient was gradually improving however in last few days started increasing cough yellowish expectoration.  Low-grade fever.  Patient is also making a trip to [place] extremely anxious. Patient continues to have increasing shortness of breath and cough . Patient has been referred to pulmonologist for evaluation.  No hemoptysis.  No chest pain.  Appetite has been stable. Patient states she continues to cough all the time. She gets extemely SOB . States she is also having problems with A-fib and will be  reevaluated after her visit today and an appointment with Jenny Cooper   Patient is here for ongoing evaluation and had a PET scan done continues to shortness of breath. Cough with occasional yellowish expectoration Patient continues to have shortness of breath Patient underwent bronchoscopy and results were negative for any malignancy This and continues to your shortness of breath  REVIEW OF SYSTEMS:    general status: Patient is feeling weak and tired.. Gradual improvement in the strength or pure of last few days  No change in a performance status.  No chills.  No fever. HEENT: Alopecia, resolving.  No evidence of stomatitis Lungs: Patient started increasing cough , increasing shortness of breath Cardiac: No chest pain or paroxysmal nocturnal dyspnea GI: No nausea no vomiting no diarrhea no abdominal pain Skin: No rash Lower extremity no swelling Neurological system: Grade 1 neuropathy  No other focal signs  Musculoskeletal system no bony pains  As per HPI. Otherwise, a complete review of systems is negatve.  PAST MEDICAL HISTORY: Past Medical History  Diagnosis Date  . HTN (hypertension)   . Pacemaker     a. MDT 2002; b. generator replacement 2013; c. followed by Dr. Omelia Blackwater, MD  . HLD (hyperlipidemia)   . CAD (coronary artery disease)     a. s/p MI x 2 in 2002 s/p PCI x 2 in 2002; b. s/p 2v CABG 2002; c. stress echo 07/2004 w/ evi of pos & inf infarct & no evi of ischemia; d. 4/08 dipyridamole scan w/ multiple areas of infarct, no ischemia, EF 49%; e. cath 04/28/15 3v CAD, med Rx rec,  no targets for revasc, LM lum irregs, pLAD 30%, 100%, ost-pLCx 60%, [name] 99%, OM2 100%, p-mRCA 90%, [name] 100% L-R collats, VG-mLAD irregs, VG-OM2 oc  . Lung cancer ([place])   . History of colonoscopy 2013  . History of mammography, screening 2015  . History of Papanicolaou smear of cervix 2013  . Carcinoma of right lung ([place]) 01/03/2015    a. followed by Jenny Cooper  . Mitral regurgitation     a. s/p  mitral ring placement 09/2000; b. echo 09/2010: EF 50%, inf HK, post AK, mild MR, prosthetic mitral valve ring w/ peak gradient of 10 mmHg; b. echo 2/13: EF 50%, mild MR/TR     . History of blood clots     12/2001  . Atypical atrial flutter ([place])     a. s/p ablation 07/27/2013  . PAF (paroxysmal atrial fibrillation) (H[facility])     a. on Eliquis   . COPD (chronic obstructive pulmonary disease) ([place])   . Chronic systolic CHF (congestive heart failure) ([place])     a. echo 03/2015: EF 30-35%, sev ant/inf/pos HK, in mild to mod MR  . Myocardial infarction ([facility])     X 2 (LAST ONE IN 200)  . Hypothyroidism   . GERD (gastroesophageal reflux disease)   . Neuropathy ([place])     PAST SURGICAL HISTORY: Past Surgical History  Procedure Laterality Date  . Appendectomy    . Vaginal hysterectomy    . Cardiac bypass    . Pacemaker insertion N/A   . Cardiac catheterization N/A 04/28/2015    Procedure: Left Heart Cath and Coronary Angiography;  Surgeon: Jenny Hampshire, MD;  Location: [place] [facility];  Service: Cardiovascular;  Laterality: N/A;  . Endobronchial ultrasound N/A 10/06/2015    Procedure: ENDOBRONCHIAL ULTRASOUND;  Surgeon: Jenny Lipps, MD;  Location: [facility];  Service: Cardiopulmonary;  Laterality: N/A;  . Electromagnetic navigation brochoscopy N/A 10/06/2015    Procedure: ELECTROMAGNETIC NAVIGATION BRONCHOSCOPY;  Surgeon: Jenny Lipps, MD;  Location: [facility];  Service: Cardiopulmonary;  Laterality: N/A;    FAMILY HISTORY Family History  Problem Relation Age of Onset  . COPD Mother     sister, and brother        ADVANCED DIRECTIVES:Patient does not have any advanced healthcare directive. Information has been given. HEALTH MAINTENANCE: Social History  Substance Use Topics  . Smoking status: Former Smoker -- 2.00 packs/day for 42 years    Types: Cigarettes    Quit date: 08/30/2000  . Smokeless tobacco: None     Comment: quit smoking in 08/28/2000  . Alcohol Use: 6.0 oz/week    10  Glasses of wine per week     Comment: 2 glasses of wine per day     Allergies  Allergen Reactions  . Lovenox [Enoxaparin Sodium] Itching  . Meperidine Other (See Comments)    Other Reaction: pt does not like how it makes her feel    Current Outpatient Prescriptions  Medication Sig Dispense Refill  . apixaban (ELIQUIS) 5 MG TABS tablet Take 5 mg by mouth 2 (two) times daily.    Marland Kitchen atorvastatin (LIPITOR) 20 MG tablet Take 20 mg by mouth daily at 6 PM.     . Cetirizine HCl 10 MG CAPS Take 1 capsule (10 mg total) by mouth 1 day or 1 dose. 30 capsule 5  . doxycycline (VIBRA-TABS) 100 MG tablet Take 1 tablet (100 mg total) by mouth 2 (two) times daily. 20 tablet 0  . Fluticasone-Salmeterol (ADVAIR DISKUS) 250-50 MCG/DOSE AEPB  Inhale 1 puff into the lungs every 12 (twelve) hours. 60 each 0  . guaiFENesin (MUCINEX) 600 MG 12 hr tablet Take 1 tablet (600 mg total) by mouth 2 (two) times daily. 30 tablet 0  . ipratropium-albuterol (DUONEB) 0.5-2.5 (3) MG/3ML SOLN TAKE 3ML BY NEBULIZER EVERY 6 HOURS AS NEEDED  2  . levothyroxine (SYNTHROID, LEVOTHROID) 75 MCG tablet Take 75 mcg by mouth daily before breakfast.    . metoprolol succinate (TOPROL XL) 25 MG 24 hr tablet Take 1 tablet (25 mg total) by mouth daily. Take 40m every morning and 556mevery evening 90 tablet 5  . omeprazole (PRILOSEC) 20 MG capsule Take 20 mg by mouth every morning.     . phenol-menthol (CEPASTAT) 14.5 MG lozenge Place 1 lozenge inside cheek as needed for sore throat. 100 tablet 0  . sacubitril-valsartan (ENTRESTO) 24-26 MG Take 1 tablet by mouth 2 (two) times daily. 60 tablet 5  . tiotropium (SPIRIVA) 18 MCG inhalation capsule Place 1 capsule (18 mcg total) into inhaler and inhale daily. 30 capsule 12  . levofloxacin (LEVAQUIN) 500 MG tablet Take 1 tablet (500 mg total) by mouth daily. 7 tablet 0   No current facility-administered medications for this visit.   Facility-Administered Medications Ordered in Other Visits    Medication Dose Route Frequency Provider Last Rate Last Dose  . sodium chloride 0.9 % injection 10 mL  10 mL Intravenous PRN JaForest GleasonMD   10 mL at 02/19/15 1000    OBJECTIVE:  Filed Vitals:   10/09/15 0823  BP: 138/95  Pulse: 103  Temp: 95.9 F (35.5 C)     Body mass index is 24.89 kg/(m^2).    ECOG FS:1 - Symptomatic but completely ambulatory  PHYSICAL EXAM: GENERAL:  Well developed, well nourished, sitting comfortably in the exam room in no acute distress. MENTAL STATUS:  Alert and oriented to person, place and time. HEAD: alopecia Normocephalic, atraumatic, face symmetric, no Cushingoid features. EYES: eyes.  Pupils equal round and reactive to light and accomodation.  No conjunctivitis or scleral icterus. ENT:  Oropharynx clear without lesion.  Tongue normal. Mucous membranes moist.  RESPIRATORY:  Occasional rhonchi CARDIOVASCULAR:  Regular rate and rhythm without murmur, rub or gallop. . ABDOMEN:  Soft, non-tender, with active bowel sounds, and no hepatosplenomegaly.  No masses. BACK:  No CVA tenderness.  No tenderness on percussion of the back or rib cage. SKIN:  No rashes, ulcers or lesions. EXTREMITIES: No edema, no skin discoloration or tenderness.  No palpable cords. LYMPH NODES: No palpable cervical, supraclavicular, axillary or inguinal adenopathy  NEUROLOGICAL: Unremarkable. PSYCH:  Appropriate.   LAB RESULTS:  Admission on 10/06/2015, Discharged on 10/07/2015  Component Date Value Ref Range Status  . CYTOLOGY - NON GYN 10/06/2015    Final                   Value:Cytology - Non PAP CASE: [id number] PATIENT: Jenny Cooper Non-Gyn Cytology Report     SPECIMEN SUBMITTED: A. Lung, left lower lobe mass, brushing  CLINICAL HISTORY: History of squamous cell carcinoma right hilum, status post chemo / radiation. Now with LLL lung lesion and persistent soft tissue density right hilum  PRE-OPERATIVE DIAGNOSIS: LLL mass  POST-OPERATIVE  DIAGNOSIS: Same as pre-op     DIAGNOSIS: A. LUNG MASS, LEFT LOWER LOBE; BRUSHING: - BRONCHIAL CELLS AND MACROPHAGES. - NEGATIVE FOR MALIGNANCY.  Comment: Please see concurrent cases [id number] and [id number], [id number].   GROSS DESCRIPTION: A. Site: Left lower lobe mass  Procedure: ENB Cytotechnologist: Georgina Snell, Micheline Rough Specimen(s) collected: 3 Diff Quik stained slides      Volume: 30 mL      Description: Clear fluid with 3 collection brushes      Submitted for:ThinPrep  Final Diagnosis performed by Quay Burow, MD.  Electronically signed 10/07/2015 2:47:25PM    The elec                         tronic signature indicates that the named Attending Pathologist has evaluated the specimen  Technical component performed at [facility], [address] [place], [place] [address] Lab: [phone] Dir: Darrick Penna. Evette Doffing, MD  Professional component performed at [facility], [facility], [place], [place], [place] [address] Lab: [phone] Dir: Dellia Nims. Rubinas, MD    . CYTOLOGY - NON GYN 10/06/2015    Final                   Value:Cytology - Non PAP CASE: [id number] PATIENT: Jenny Cooper Non-Gyn Cytology Report     SPECIMEN SUBMITTED: A. FNA, right hilar mass  CLINICAL HISTORY: History of squamous cell carcinoma right hilum, status post chemo / radiation. Now with LLL lung lesion and persistent soft tissue density right hilum  PRE-OPERATIVE DIAGNOSIS: Right hilar tissue density  POST-OPERATIVE DIAGNOSIS: Large dense right hilar mass     DIAGNOSIS: A. HILAR MASS, RIGHT; EBUS ASSISTED FNA: - BLOOD CLOT WITH OCCASIONAL BRONCHIAL CELLS AND MACROPHAGES. - NEGATIVE FOR MALIGNANCY.  Comment: Please see concurrent cases [id number], [id number], [id number].   GROSS DESCRIPTION: A. Site: Right hilar mass Procedure: EBUS Cytotechnologist: Jeanelle Malling Zyretta Gordon Specimen(s) collected: 3 Diff Quik stained slides 3 Pap stained slides       Volume: 30 mL      Description: Pink red fluid with large amount of pale tan to red soft material      Submitted for:           ThinPrep                                    Cell block(s): 3  Final Diagnosis performed by Quay Burow, MD.  Electronically signed 10/07/2015 2:54:19PM    The electronic signature indicates that the named Attending Pathologist has evaluated the specimen  Technical component performed at [facility], [address], [place], [place] [address] Lab: [phone] Dir: Darrick Penna. Evette Doffing, MD  Professional component performed at [facility], [facility], [place], [place], [place] [address] Lab: [phone] Dir: Dellia Nims. Reuel Derby, MD    . SURGICAL PATHOLOGY 10/06/2015    Final                   Value:Surgical Pathology CASE: [id number] PATIENT: Jenny Cooper Surgical Pathology Report     SPECIMEN SUBMITTED: A. Lung, left lower lobe mass, biopsy  CLINICAL HISTORY: History of squamous cell carcinoma right hilum, status post chemo / radiation. Now with LLL lung lesion and persistent soft tissue density right hilum  PRE-OPERATIVE DIAGNOSIS: LLL lung mass  POST-OPERATIVE DIAGNOSIS: Same as pre-op     DIAGNOSIS: A. LUNG MASS, LEFT LOWER LOBE; ENB ASSISTED FORCEPS BIOPSY: - NEGATIVE FOR MALIGNANCY. - SMALL FRAGMENTS OF BLOOD CLOT, BENIGN BRONCHIAL MUCOSA, AND CARTILAGE.  Comment: See concurrent cases [id number], and 35.  GROSS DESCRIPTION: A. Site: Left lower lobe mass Procedure: ENB Cytotechnologist: Micheline Rough, Ashlee Howze Specimen(s) collected: 1 Diff  Quik stained touch prep slide Tissue fragment(s): 4  Size: From 0.1 x 0.1 up to 0.5 x 0.1 cm  Description: Pink red cylindrically shaped tissue fragments and blood clot  Entirely su                         bmitted in 2 cassette(s).   Final Diagnosis performed by Quay Burow, MD.  Electronically signed 10/07/2015 2:41:48PM    The electronic signature  indicates that the named Attending Pathologist has evaluated the specimen  Technical component performed at [facility], [address], [place], [place] [address] Lab: [phone] Dir: Darrick Penna. Evette Doffing, MD  Professional component performed at [facility], [facility], [place], [place], [place] [address] Lab: [phone] Dir: Dellia Nims. Rubinas, MD    . CYTOLOGY - NON GYN 10/06/2015    Final                   Value:Cytology - Non PAP CASE: [id number] PATIENT: Jenny Cooper Non-Gyn Cytology Report     SPECIMEN SUBMITTED: A. FNA, lung, left lower lobe mass  CLINICAL HISTORY: History of squamous cell carcinoma right hilum, status post chemo / radiation. Now with LLL lung lesion and persistent soft tissue density right hilum  PRE-OPERATIVE DIAGNOSIS: LLL mass  POST-OPERATIVE DIAGNOSIS: Same as pre-op     DIAGNOSIS: A. LUNG MASS, LEFT LOWER LOBE; ENB ASSISTED FNA: - NEGATIVE FOR MALIGNANCY. - BRONCHIAL CELLS AND MACROPHAGES.  Comment: Please see concurrent cases [id number], [id number] and 34.   GROSS DESCRIPTION: A. Site: Single needle aspirate left lower lobe mass Procedure: ENB Cytotechnologist: Micheline Rough, Ashley Howze Specimen(s) collected: 3 Diff Quik stained slides 3 Pap stained slides      Volume: 30 mL      Description: Pink red fluid with a small amount of red soft material      Submitted for:           ThinPrep           Cell block(s): 1                            Final Diagnosis performed by Quay Burow, MD.  Electronically signed 10/07/2015 3:05:50PM    The electronic signature indicates that the named Attending Pathologist has evaluated the specimen  Technical component performed at [facility], [address] [place], [place] [address] Lab: [phone] Dir: Darrick Penna. Evette Doffing, MD  Professional component performed at [facility], [facility], [place], [place], [place] [address] Lab:  [phone] Dir: Dellia Nims. Rubinas, MD    . WBC 10/06/2015 6.8  3.6 - 11.0 K/uL Final  . RBC 10/06/2015 3.94  3.80 - 5.20 MIL/uL Final  . Hemoglobin 10/06/2015 10.7* 12.0 - 16.0 g/dL Final  . HCT 10/06/2015 33.4* 35.0 - 47.0 % Final  . MCV 10/06/2015 84.7  80.0 - 100.0 fL Final  . MCH 10/06/2015 27.2  26.0 - 34.0 pg Final  . MCHC 10/06/2015 32.2  32.0 - 36.0 g/dL Final  . RDW 10/06/2015 20.2* 11.5 - 14.5 % Final  . Platelets 10/06/2015 280  150 - 440 K/uL Final  . Sodium 10/06/2015 139  135 - 145 mmol/L Final  . Potassium 10/06/2015 4.6  3.5 - 5.1 mmol/L Final  . Chloride 10/06/2015 105  101 - 111 mmol/L Final  . CO2 10/06/2015 24  22 - 32 mmol/L Final  . Glucose, Bld 10/06/2015 150* 65 - 99  mg/dL Final  . BUN 10/06/2015 23* 6 - 20 mg/dL Final  . Creatinine, Ser 10/06/2015 0.97  0.44 - 1.00 mg/dL Final  . Calcium 10/06/2015 9.0  8.9 - 10.3 mg/dL Final  . GFR calc non Af Amer 10/06/2015 55* >60 mL/min Final  . GFR calc Af Amer 10/06/2015 >60  >60 mL/min Final   Comment: (NOTE) The eGFR has been calculated using the CKD EPI equation. This calculation has not been validated in all clinical situations. eGFR's persistently <60 mL/min signify possible Chronic Kidney Disease.   . Anion gap 10/06/2015 10  5 - 15 Final  . MRSA by PCR 10/06/2015 NEGATIVE  NEGATIVE Final   Comment:        The GeneXpert MRSA Assay (FDA approved for NASAL specimens only), is one component of a comprehensive MRSA colonization surveillance program. It is not intended to diagnose MRSA infection nor to guide or monitor treatment for MRSA infections.   . Sodium 10/07/2015 139  135 - 145 mmol/L Final  . Potassium 10/07/2015 4.5  3.5 - 5.1 mmol/L Final  . Chloride 10/07/2015 102  101 - 111 mmol/L Final  . CO2 10/07/2015 29  22 - 32 mmol/L Final  . Glucose, Bld 10/07/2015 165* 65 - 99 mg/dL Final  . BUN 10/07/2015 25* 6 - 20 mg/dL Final  . Creatinine, Ser 10/07/2015 1.02* 0.44 - 1.00 mg/dL Final  . Calcium  10/07/2015 9.5  8.9 - 10.3 mg/dL Final  . GFR calc non Af Amer 10/07/2015 52* >60 mL/min Final  . GFR calc Af Amer 10/07/2015 60* >60 mL/min Final   Comment: (NOTE) The eGFR has been calculated using the CKD EPI equation. This calculation has not been validated in all clinical situations. eGFR's persistently <60 mL/min signify possible Chronic Kidney Disease.   . Anion gap 10/07/2015 8  5 - 15 Final  . WBC 10/07/2015 5.6  3.6 - 11.0 K/uL Final  . RBC 10/07/2015 3.96  3.80 - 5.20 MIL/uL Final  . Hemoglobin 10/07/2015 10.7* 12.0 - 16.0 g/dL Final  . HCT 10/07/2015 33.7* 35.0 - 47.0 % Final  . MCV 10/07/2015 85.1  80.0 - 100.0 fL Final  . MCH 10/07/2015 27.0  26.0 - 34.0 pg Final  . MCHC 10/07/2015 31.7* 32.0 - 36.0 g/dL Final  . RDW 10/07/2015 20.2* 11.5 - 14.5 % Final  . Platelets 10/07/2015 266  150 - 440 K/uL Final   PET scan has been reviewed independently Lab data has been reviewed.  Assessment and plan Stage III non-small cell carcinoma of lung.Marland Kitchen  PET scan shows multiple pulmonary patchy infiltrate.  Atypical infection versus progressing cancer Bronchoscopy was negative for any malignancy cultures are pending The patient has been started on doxycycline followed by Levaquin Will be  referred to a pulmonologist for evaluation 6. Case was discussed in  tumor conference  Patient  is off prednisone    Jenny Gleason, MD   10/11/2015 11:33 AM

## 2015-10-13 ENCOUNTER — Ambulatory Visit: Payer: Medicare Other

## 2015-10-13 ENCOUNTER — Other Ambulatory Visit: Payer: Self-pay | Admitting: Oncology

## 2015-10-13 ENCOUNTER — Encounter: Payer: Self-pay | Admitting: Oncology

## 2015-10-14 ENCOUNTER — Other Ambulatory Visit: Payer: Self-pay | Admitting: *Deleted

## 2015-10-14 DIAGNOSIS — R059 Cough, unspecified: Secondary | ICD-10-CM

## 2015-10-14 DIAGNOSIS — R05 Cough: Secondary | ICD-10-CM

## 2015-10-14 DIAGNOSIS — R0600 Dyspnea, unspecified: Secondary | ICD-10-CM

## 2015-10-14 DIAGNOSIS — C3491 Malignant neoplasm of unspecified part of right bronchus or lung: Secondary | ICD-10-CM

## 2015-10-14 DIAGNOSIS — R918 Other nonspecific abnormal finding of lung field: Secondary | ICD-10-CM

## 2015-10-14 MED ORDER — LEVOFLOXACIN 500 MG PO TABS: 500.0000 mg | ORAL_TABLET | Freq: Every day | ORAL | Status: DC

## 2015-10-16 ENCOUNTER — Other Ambulatory Visit: Payer: Medicare Other

## 2015-10-16 ENCOUNTER — Ambulatory Visit: Payer: Medicare Other | Admitting: Oncology

## 2015-10-16 ENCOUNTER — Ambulatory Visit: Payer: Medicare Other

## 2015-10-17 ENCOUNTER — Other Ambulatory Visit: Payer: Self-pay | Admitting: *Deleted

## 2015-10-17 DIAGNOSIS — C3491 Malignant neoplasm of unspecified part of right bronchus or lung: Secondary | ICD-10-CM

## 2015-10-20 ENCOUNTER — Other Ambulatory Visit: Payer: Medicare Other

## 2015-10-20 ENCOUNTER — Ambulatory Visit: Payer: Medicare Other | Admitting: Oncology

## 2015-10-21 ENCOUNTER — Encounter: Payer: Self-pay | Admitting: Internal Medicine

## 2015-10-21 ENCOUNTER — Inpatient Hospital Stay: Payer: Medicare Other

## 2015-10-21 ENCOUNTER — Ambulatory Visit (INDEPENDENT_AMBULATORY_CARE_PROVIDER_SITE_OTHER): Payer: Medicare Other | Admitting: Internal Medicine

## 2015-10-21 VITALS — BP 132/70 | HR 116 | Ht 67.0 in | Wt 153.2 lb

## 2015-10-21 DIAGNOSIS — Z7689 Persons encountering health services in other specified circumstances: Secondary | ICD-10-CM

## 2015-10-21 DIAGNOSIS — J984 Other disorders of lung: Secondary | ICD-10-CM

## 2015-10-21 DIAGNOSIS — R059 Cough, unspecified: Secondary | ICD-10-CM

## 2015-10-21 DIAGNOSIS — R918 Other nonspecific abnormal finding of lung field: Secondary | ICD-10-CM | POA: Diagnosis not present

## 2015-10-21 DIAGNOSIS — J432 Centrilobular emphysema: Secondary | ICD-10-CM | POA: Diagnosis not present

## 2015-10-21 DIAGNOSIS — J700 Acute pulmonary manifestations due to radiation: Secondary | ICD-10-CM

## 2015-10-21 DIAGNOSIS — R05 Cough: Secondary | ICD-10-CM

## 2015-10-21 DIAGNOSIS — Z7189 Other specified counseling: Secondary | ICD-10-CM

## 2015-10-21 DIAGNOSIS — C3491 Malignant neoplasm of unspecified part of right bronchus or lung: Secondary | ICD-10-CM

## 2015-10-21 MED ORDER — PREDNISONE 20 MG PO TABS: ORAL_TABLET | ORAL | Status: DC

## 2015-10-21 MED ORDER — AMOXICILLIN-POT CLAVULANATE 500-125 MG PO TABS: 1.0000 | ORAL_TABLET | Freq: Three times a day (TID) | ORAL | Status: DC

## 2015-10-21 MED ORDER — HYDROCODONE-GUAIFENESIN 2.5-200 MG/5ML PO SOLN: 5.0000 mL | Freq: Two times a day (BID) | ORAL | Status: DC

## 2015-10-21 NOTE — Assessment & Plan Note (Signed)
CT images reviewed, there are right upper lobe changes, that has a temporal relationship to recent radiation. Her last radiation dose was August 2016, since then has required 3 subsequent prednisone tapers. I discussed prolonged steroid use with the patient and her son today, they're in agreement with it. She will need close monitoring of her blood glucose level, bone density,  etc. Patient will be also referred to a primary care physician.  Plan: -Prednisone 40 mg daily for the next 2 weeks, then reduce to 30 mg daily until follow-up visit

## 2015-10-21 NOTE — Patient Instructions (Signed)
Follow up with Dr. Stevenson Clinch in: 6 weeks - For now stop Advair, Spiriva, Levaquin, doxycycline, Celexa. -Prednisone 40 mg daily-take with breakfast for 3 weeks, then on starting on 11/11/2015 start taking prednisone 30 mg daily until follow-up visit. -Repeat CT chest with contrast for shortness of breath, history of lung cancer, left lower lobe mass prior to follow-up visit -Augmentin 500/1 25-1 tab daily for 6 weeks. If diarrhea develops and is continuous for 3 or more days then stop Augmentin and inform us immediately. -Hydrocodone/guaifenesin (2.'5mg'$ /'200mg'$ )/82m - 1 teaspoon as needed for cough every 12 hours. Dispense 60 mL, refill 1. -Continue to avoid any forms of tobacco or noxious substances -We'll [facility] you to [facility] with a PMD. -Given your radiation pneumonitis and chronic cough with history of cancer, you will be on chronic prednisone use for 3 months or possibly longer, requiring close monitoring of your blood glucose levels, and bone density, which a primary physician can assist with.

## 2015-10-21 NOTE — Progress Notes (Signed)
Date: 10/22/2015  MRN# [id number] Jenny Cooper February 14, 1936  PMD - Dr. Gayland Curry Jenny Cooper is a 80 y.o. old female seen in follow up for new LLL mass.   CC:  Chief Complaint  Patient presents with  . pulmonary consult    pt. states breathing has wrosen since hosp. c/o SOB. dry cough sometimes prod. clear in color. wheezing. occ. chest pain/tightness.    Synopsis - 80 year old female first evaluated by pulmonary in early 2016 for chronic cough, found to have right hilar mass, even this of mass and pathology specimens positive for squamous cell, stage IIIa. Now status post chemoradiation, recently found to have left lower lobe mass, with chronic cough, chronic antibiotics. Biopsy, EBUS, ENB, of left lower lobe mass negative for malignancy  Events since last clinic visit: Patient presents today for follow-up visit of right hilar mass, chronic cough, new left lower lobe mass. 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 Repeat CT in January 2017 showed a new left lower lobe mass, she had bronchoscopy with FNA, and ENB of left lower lobe mass, and even this of right hilum again. This workup was subsequently negative as of February 2017. She continues to have chronic cough intermittent sputum production, intermittent cycles of cough/spells.  After her bronchoscopy in February 2017 she had a one night overnight stay at the hospital for hypoxia which subsequently acutely resolved.  Review of chart shows that she's had 2-3 cycles of prednisone tapers for various reasons including chronic cough, post obstructive pneumonia, suspected radiation pneumonitis. Further review of chart shows bronchoscopy in [place] was only with biopsies and no  BAL. Review of her chart and review her images were discussed with the patient during today's visit, she was also accompanied by her son.   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    Patient had compression of the right mainstem bronchus because of enlarged lymph node and a mass. Stage is T4 N1 M0 tumor stage IIIa diagnosis in January of 2016 2.started on radiation and chemotherapy from October 21, 2014 3.finished 6 cycles of carboplatinum and Taxol  in March  29 th of 2016, As finished concurrent chemoradiation therapy and PET scan shows significant response 4. started on consolidation chemotherapy with carboplatinum and Taxol 2 cycle    Patient finished 2 cycles of consultation therapy on March 06, 2015    5.  Atrial fibrillation diagnosis in August of 2016 on  eloquis 6.  Repeat bronchoscopy was negative for any malignancy      PMHX:   Past Medical History  Diagnosis Date  . HTN (hypertension)   . Pacemaker     a. MDT 2002; b. generator replacement 2013; c. followed by Dr. Omelia Blackwater, MD  . HLD (hyperlipidemia)   . CAD (coronary artery disease)     a. s/p MI x 2 in 2002 s/p PCI x 2 in 2002; b. s/p 2v CABG 2002; c. stress echo 07/2004 w/ evi of pos & inf infarct & no evi of ischemia; d. 4/08 dipyridamole scan w/ multiple areas of infarct, no ischemia, EF 49%; e. cath 04/28/15 3v CAD, med Rx rec, no  targets for revasc, LM lum irregs, pLAD 30%, 100%, ost-pLCx 60%, [name] 99%, OM2 100%, p-mRCA 90%, [name] 100% L-R collats, VG-mLAD irregs, VG-OM2 oc  . Lung cancer ([place])   . History of colonoscopy 2013  . History of mammography, screening 2015  . History of Papanicolaou smear of cervix 2013  . Carcinoma of right lung ([place]) 01/03/2015    a. followed by Dr. Oliva Bustard  . Mitral regurgitation     a. s/p mitral ring placement 09/2000; b. echo  09/2010: EF 50%, inf HK, post AK, mild MR, prosthetic mitral valve ring w/ peak gradient of 10 mmHg; b. echo 2/13: EF 50%, mild MR/TR     . History of blood clots     12/2001  . Atypical atrial flutter ([place])     a. s/p ablation 07/27/2013  . PAF (paroxysmal atrial fibrillation) ([facility])     a. on Eliquis   . COPD (chronic obstructive pulmonary disease) ([place])   . Chronic systolic CHF (congestive heart failure) ([place])     a. echo 03/2015: EF 30-35%, sev ant/inf/pos HK, in mild to mod MR  . Myocardial infarction ([facility])     X 2 (LAST ONE IN 200)  . Hypothyroidism   . GERD (gastroesophageal reflux disease)   . Neuropathy [facility])    Surgical Hx:  Past Surgical History  Procedure Laterality Date  . Appendectomy    . Vaginal hysterectomy    . Cardiac bypass    . Pacemaker insertion N/A   . Cardiac catheterization N/A 04/28/2015    Procedure: Left Heart Cath and Coronary Angiography;  Surgeon: Wellington Hampshire, MD;  Location: [facility];  Service: Cardiovascular;  Laterality: N/A;  . Endobronchial ultrasound N/A 10/06/2015    Procedure: ENDOBRONCHIAL ULTRASOUND;  Surgeon: Flora Lipps, MD;  Location: [facility];  Service: Cardiopulmonary;  Laterality: N/A;  . Electromagnetic navigation brochoscopy N/A 10/06/2015    Procedure: ELECTROMAGNETIC NAVIGATION BRONCHOSCOPY;  Surgeon: Flora Lipps, MD;  Location: [facility];  Service: Cardiopulmonary;  Laterality: N/A;   Family Hx:  Family History  Problem Relation Age of Onset  . COPD Mother     sister, and brother   Social Hx:   Social History  Substance Use Topics  . Smoking status: Former Smoker -- 2.00 packs/day for 42 years    Types: Cigarettes    Quit date: 08/30/2000  . Smokeless tobacco: None     Comment: quit smoking in 08/28/2000  . Alcohol Use: 6.0 oz/week    10 Glasses of wine per week     Comment: 2 glasses of wine per day   Medication:   Current Outpatient Rx  Name  Route  Sig  Dispense  Refill  . apixaban (ELIQUIS) 5 MG TABS  tablet   Oral   Take 5 mg by mouth 2 (two) times daily.         Marland Kitchen atorvastatin (LIPITOR) 20 MG tablet   Oral   Take 20 mg by mouth daily at 6 PM.          . Cetirizine HCl 10 MG CAPS   Oral   Take 1 capsule (10 mg total) by mouth 1 day or 1 dose.   30 capsule   5   . ipratropium-albuterol (DUONEB) 0.5-2.5 (3) MG/3ML SOLN      TAKE 3ML BY NEBULIZER EVERY 6 HOURS AS NEEDED      2   . levothyroxine (SYNTHROID, LEVOTHROID) 75 MCG tablet   Oral   Take 75 mcg  by mouth daily before breakfast.         . metoprolol succinate (TOPROL XL) 25 MG 24 hr tablet   Oral   Take 1 tablet (25 mg total) by mouth daily. Take '25mg'$  every morning and '50mg'$  every evening   90 tablet   5   . omeprazole (PRILOSEC) 20 MG capsule   Oral   Take 20 mg by mouth every morning.          . phenol-menthol (CEPASTAT) 14.5 MG lozenge   Buccal   Place 1 lozenge inside cheek as needed for sore throat.   100 tablet   0   . sacubitril-valsartan (ENTRESTO) 24-26 MG   Oral   Take 1 tablet by mouth 2 (two) times daily.   60 tablet   5   . amoxicillin-clavulanate (AUGMENTIN) 500-125 MG tablet   Oral   Take 1 tablet (500 mg total) by mouth 3 (three) times daily.   45 tablet   0   . HYDROcodone-GuaiFENesin 2.5-200 MG/5ML SOLN   Oral   Take 5 mLs by mouth 2 times daily at 12 noon and 4 pm.   60 mL   0   . predniSONE (DELTASONE) 20 MG tablet      Take '40mg'$  for 3 wk then starting on 11/11/15 start taking '30mg'$  until follow up   82 tablet   0       Allergies:  Lovenox and Meperidine  Review of Systems: Gen:  Denies  fever, sweats, chills HEENT: Denies blurred vision, double vision, ear pain, eye pain, hearing loss, nose bleeds, sore throat Cvc:  No dizziness, chest pain or heaviness Resp:   Cough - intermittent brownish production.  Gi: Denies swallowing difficulty, stomach pain, nausea or vomiting, diarrhea, constipation, bowel incontinence Gu:  Denies bladder incontinence, burning  urine Ext:   No Joint pain, stiffness or swelling Skin: No skin rash, easy bruising or bleeding or hives Endoc:  No polyuria, polydipsia , polyphagia or weight change Psych: No depression, insomnia or hallucinations  Other:  All other systems negative  Physical Examination:   VS: BP 132/70 mmHg  Pulse 116  Ht '5\' 7"'$  (1.702 m)  Wt 153 lb 3.2 oz (69.491 kg)  BMI 23.99 kg/m2  SpO2 90%  General Appearance: No distress  Neuro:without focal findings, mental status, speech normal, alert and oriented, cranial nerves 2-12 intact, reflexes normal and symmetric, sensation grossly normal  HEENT: PERRLA, EOM intact, no ptosis, no other lesions noticed; Mallampati 2 Pulmonary: coarse upper airway sounds, dec BS at the left bases, fine rales at the RUL, diaphragmatic excursion normal.No wheezing,    Sputum Production:  none CardiovascularNormal S1,S2.  No m/r/g.  Abdominal aorta pulsation normal.    Abdomen: Benign, Soft, non-tender, No masses, hepatosplenomegaly, No lymphadenopathy Renal:  No costovertebral tenderness  GU:  No performed at this time. Endoc: No evident thyromegaly, no signs of acromegaly or Cushing features Skin:   warm, no rashes, no ecchymosis  Extremities: normal, no cyanosis, clubbing, no edema, warm with normal capillary refill. Other findings:   Rad results: (The following images and results were reviewed by Dr. Stevenson Clinch on 10/21/15). CXR 02/18/14 1. Stable heart and mediastinum post median sternotomy and mitral valve repair. There is a left-sided pacemaker with right atrial and right ventricular leads. The pacemaker generator has flipped position in comparison to prior. Recommend correlation for possible pacemaker pocket infection. 2. Right apical pleural thickening is unchanged. Lungs and pleural spaces are otherwise within normal limits without  acute abnormality.   ECHO 03/2014 MILD LV DYSFUNCTION  NORMAL RIGHT VENTRICULAR SYSTOLIC FUNCTION VALVULAR REGURGITATION: MILD  MR, TRIVIAL PR, MILD TR PROSTHETIC VALVE(S): PROSTHETIC MV RING   PET Scan 10/01/15 CLINICAL DATA:  Subsequent treatment strategy for right-sided lung cancer, restaging. Cough.   EXAM: NUCLEAR MEDICINE PET SKULL BASE TO THIGH   TECHNIQUE: 12.0 mCi F-18 FDG was injected intravenously. Full-ring PET imaging was performed from the skull base to thigh after the radiotracer. CT data was obtained and used for attenuation correction and anatomic localization.   FASTING BLOOD GLUCOSE:  Value: 131 mg/dl   COMPARISON:  Chest CT 05/26/2015.  Most recent PET of 01/06/2015.   FINDINGS: NECK   No areas of abnormal hypermetabolism.   CHEST   9 mm node at the thoracic inlet is new since the prior PET and demonstrates hypermetabolism at a S.U.V. max of 3.0.   A right paratracheal node measures 11 mm and a S.U.V. max of 4.4 on image 61/series 3. This is significantly enlarged since the prior PET.   The hypermetabolism detailed on prior PET in the right hilum is no longer identified. There is more diffuse low-level hypermetabolism within the right perihilar lung with concurrent consolidation and bronchiectasis. This measures a S.U.V. max of 5.6, including on image 79/series 3.   Multiple bilateral areas of ground-glass and soft tissue density are new since 05/29/2015, correspond to areas of hypermetabolism. An example area of consolidation in the left lower lobe measures a S.U.V. max of 7.9 on image 108/series 3.   ABDOMEN/PELVIS   No abnormal nodal activity within the abdomen or pelvis. Rectal hypermetabolism is without CT correlate and measures a S.U.V. max of 7.8, favored to be physiologic.   SKELETON   No abnormal marrow activity.   CT IMAGES PERFORMED FOR ATTENUATION CORRECTION   No cervical adenopathy. Carotid atherosclerosis. Right Port-A-Cath which terminates at the low SVC. Pacer. Circumferential right-sided pleural thickening is new since 05/29/2015. Trace right  pleural fluid is similar. Mild cardiomegaly. Centrilobular emphysema. Normal adrenal glands. Moderate right hip osteoarthritis. Osteopenia.   IMPRESSION: 1. Right perihilar pulmonary consolidation with hypermetabolism, likely due to evolving radiation change. 2. Multi focal areas of bilateral pulmonary airspace and soft tissue opacity with hypermetabolism. distribution not typical of radiation change. Considerations include infection or even drug toxicity. 3. Developing high mediastinal adenopathy. This is suspicious for metastatic disease. Given the appearance of the lungs, a reactive/infectious etiology is possible but felt less likely. 4. No evidence of extrathoracic metastatic disease. 5. Rectal hypermetabolism is favored to be physiologic. Correlate with symptoms of colitis, felt unlikely.   CT Chest 10/05/14 CLINICAL DATA:  80 year old female with cough for the past 2 years. 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   EXAM: CT CHEST WITHOUT CONTRAST   TECHNIQUE: Multidetector CT imaging of the chest was performed following the standard protocol without IV contrast.   COMPARISON:  Multiple priors, most recently PET-CT 10/01/2015.   FINDINGS: Mediastinum/Lymph Nodes: Heart size is borderline enlarged. Pericardial thickening and calcification anterior to the right ventricle. In addition, there is myocardial thickening involving the inferior and inferolateral wall segments of the left ventricle from the mid ventricle to the base, presumably dystrophic calcification at site of prior left circumflex coronary artery territory myocardial infarction(s). There is atherosclerosis of the thoracic aorta, the great vessels of the mediastinum and the coronary arteries, including calcified atherosclerotic plaque in the left main, left anterior descending, left  circumflex and right coronary arteries. There  continues to be extensive soft tissue thickening in and around the right hilar region, which is poorly evaluated on today's noncontrast CT examination. This causes severe narrowing at the origin of the right upper lobe bronchus (image 24 of series 2). No other definite mediastinal or left hilar lymphadenopathy noted on today's noncontrast CT examination. Borderline enlarged superior mediastinal lymph node posterolateral to the right side of the trachea (image 7 of series 2), similar to the prior study. Status post median sternotomy for CABG and mitral annuloplasty. Left-sided pacemaker device in position with lead tips terminating in the right atrium and right ventricular apex. Esophagus is unremarkable in appearance. No axillary lymphadenopathy. Right internal jugular single-lumen porta cath with tip terminating in the distal superior vena cava.   Lungs/Pleura: Chronic right-sided volume loss and architectural distortion most evident in the perihilar aspect of the right lung, particularly in the right upper lobe, compatible with an area of postradiation mass-like fibrosis. Radiating outward from this central area there are extensive areas of ground-glass attenuation and septal thickening throughout the right lung, most evident in the superior aspect of the right lower lobe and in the right upper lobe. These findings appear very similar to the recent head CT from 10/01/2015, but have clearly increased compared to prior chest CT 05/26/2015. In addition, there is again multifocal nodular and mass-like areas of airspace consolidation throughout the lungs bilaterally, but most severe in the left lung, largest of which is in the left lower lobe measuring 3.3 x 4.7 cm (image 44 of series 4). These findings appear essentially unchanged compared to the recent PET-CT. Trace right partially loculated pleural effusion the mid right hemithorax is unchanged. No left pleural effusion.   Upper  Abdomen: Unremarkable.   Musculoskeletal/Soft Tissues: Median sternotomy wires. There are no aggressive appearing lytic or blastic lesions noted in the visualized portions of the skeleton.   IMPRESSION: 1. The appearance of the chest is very similar to recent PET-CT 10/01/2015, as detailed above. Given that radiation therapy was completed greater than 6 months ago, findings on today's examination are highly unlikely to be a post treatment related change. Given the interval enlargement of the area of chronic architectural distortion in the perihilar aspect of the right upper lobe noted on prior PET-CT which demonstrated some low-level hypermetabolism, the possibility of local recurrence of disease is not excluded. Accordingly, the findings in the perihilar aspect of the right lung radiating outward could reflect evolving lymphangitic spread of tumor. 2. In addition, the other multifocal nodular and mass-like areas of airspace consolidation throughout the lungs bilaterally are highly concerning for atypical infectious process, potentially fungal pneumonia. Clinical correlation is recommended. The possibility of new metastatic disease throughout the lungs bilaterally is not excluded, but is less favored given the relatively rapid development between examinations from 05/26/2015 and 10/01/2015. 3. Trace right partially loculated pleural effusion is unchanged. 4. Atherosclerosis, including left main and 3 vessels coronary artery disease. Status post median sternotomy for CABG as well as mitral annuloplasty.  ECHO 03/2014 MILD LV DYSFUNCTION  NORMAL RIGHT VENTRICULAR SYSTOLIC FUNCTION VALVULAR REGURGITATION: MILD MR, TRIVIAL PR, MILD TR PROSTHETIC VALVE(S): PROSTHETIC MV RING    Assessment and Plan: 80 year old female past medical history of chronic cough, COPD, stage IIIa non-small cell lung cancer, status post chemoradiation, seen in follow-up visit for recurrent cough, suspected  postradiation pneumonitis/fibrosis, new left lower lobe mass    Chronic obstructive pulmonary disease ([place]) Patient was formerly a nonsmoker, subsequently diagnosed  with stage III non-small cell lung cancer. Over the last year has been on intermittent doses of Advair, Spiriva, patient not consistent with use. At this time she does have components of radiation pneumonitis/fibrosis, we'll hold her inhalers due to intermittent use and financial issues. While patient should be on a maintenance inhaler, given her lung findings of continued right hilar enlargement, new left lower lobe mass, suspected radiation pneumonitis, she'll be in a chronic inflammatory state, and will subsequently be placed on prednisone. In addition, dry powder inhaler can cause worsening cough and bronchospasms, therefore will give a trial of inhaler cessation for the next 6-8 weeks.  Plan: -Patient will be on prednisone tapering doses over the next 6-8 weeks -Rescue inhaler, albuterol, as needed for shortness of breath, wheezing, persistent coughing spells  Radiation pneumonitis ([facility]) CT images reviewed, there are right upper lobe changes, that has a temporal relationship to recent radiation. Her last radiation dose was August 2016, since then has required 3 subsequent prednisone tapers. I discussed prolonged steroid use with the patient and her son today, they're in agreement with it. She will need close monitoring of her blood glucose level, bone density,  etc. Patient will be also referred to a primary care physician.  Plan: -Prednisone 40 mg daily for the next 2 weeks, then reduce to 30 mg daily until follow-up visit   Mass of lower lobe of left lung left lower lobe measuring 3.3 x 4.7 cm  CT scan chest 10/06/2015 Workup for this include recent bronchoscopy with ENB of left lower lobe mass and EBUS FNA of the right hilum - current path is negative for both Other reasons includes postobstructive pneumonia, rounded  pneumonia, atelectasis.  Plan: -Given negative pathology from recent workup from bronchoscopy, we'll treat as pneumonia, -We'll treat as postobstructive, severe aspiration, pneumonia - we'll give Augmentin 500 mg, 1 tab daily for 6 weeks and then repeat CT chest with contrast -If symptoms and/or masses not improving on follow-up CAT scan will plan for repeat bronchoscopy with BAL, this was discussed with the patient and her son, who are both in agreement  ADDENDUM: - I have spoken with Dr. Oliva Bustard, Oncology, about this patient case and management.  He states that given negative malignancy from the bronchoscopy, he previously scheduled patient for a CT guided biospy of the LLL mass, and would like to continue with this approach.  I agreed with the CT guided biopsy (which will happen this week), along with continued plan as above to treat as pneumonia, pending pathology results from the CT guided biopsy.   Cough Multifactorial: Postradiation pneumonitis, fibrosis, architectural distortion, anatomical distortion of the right mainstem, right hilum inflammation, left lower lobe mass, COPD,  Plan: -Hold current inhalers which include Spiriva, and Advair -Treat radiation pneumonitis/fibrosis with prednisone -Treat left lower lobe mass is pneumonia with Augmentin -Hydrocodone as needed 1 teaspoon twice a day, as needed DuoNeb's -Repeat CT with contrast in 6 weeks    Updated Medication List Outpatient Encounter Prescriptions as of 10/21/2015  Medication Sig  . apixaban (ELIQUIS) 5 MG TABS tablet Take 5 mg by mouth 2 (two) times daily.  Marland Kitchen atorvastatin (LIPITOR) 20 MG tablet Take 20 mg by mouth daily at 6 PM.   . Cetirizine HCl 10 MG CAPS Take 1 capsule (10 mg total) by mouth 1 day or 1 dose.  Marland Kitchen ipratropium-albuterol (DUONEB) 0.5-2.5 (3) MG/3ML SOLN TAKE 3ML BY NEBULIZER EVERY 6 HOURS AS NEEDED  . levothyroxine (SYNTHROID, LEVOTHROID) 75 MCG tablet Take 75  mcg by mouth daily before breakfast.  .  metoprolol succinate (TOPROL XL) 25 MG 24 hr tablet Take 1 tablet (25 mg total) by mouth daily. Take '25mg'$  every morning and '50mg'$  every evening  . omeprazole (PRILOSEC) 20 MG capsule Take 20 mg by mouth every morning.   . phenol-menthol (CEPASTAT) 14.5 MG lozenge Place 1 lozenge inside cheek as needed for sore throat.  . sacubitril-valsartan (ENTRESTO) 24-26 MG Take 1 tablet by mouth 2 (two) times daily.  . [DISCONTINUED] predniSONE (DELTASONE) 10 MG tablet Take 10 mg by mouth daily with breakfast.  . amoxicillin-clavulanate (AUGMENTIN) 500-125 MG tablet Take 1 tablet (500 mg total) by mouth 3 (three) times daily.  Marland Kitchen HYDROcodone-GuaiFENesin 2.5-200 MG/5ML SOLN Take 5 mLs by mouth 2 times daily at 12 noon and 4 pm.  . predniSONE (DELTASONE) 20 MG tablet Take '40mg'$  for 3 wk then starting on 11/11/15 start taking '30mg'$  until follow up  . [DISCONTINUED] doxycycline (VIBRA-TABS) 100 MG tablet Take 1 tablet (100 mg total) by mouth 2 (two) times daily. (Patient not taking: Reported on 10/21/2015)  . [DISCONTINUED] Fluticasone-Salmeterol (ADVAIR DISKUS) 250-50 MCG/DOSE AEPB Inhale 1 puff into the lungs every 12 (twelve) hours. (Patient not taking: Reported on 10/21/2015)  . [DISCONTINUED] levofloxacin (LEVAQUIN) 500 MG tablet Take 1 tablet (500 mg total) by mouth daily. (Patient not taking: Reported on 10/21/2015)  . [DISCONTINUED] MUCINEX 600 MG 12 hr tablet TAKE 1 TABLET (600 MG TOTAL) BY MOUTH 2 (TWO) TIMES DAILY. (Patient not taking: Reported on 10/21/2015)  . [DISCONTINUED] tiotropium (SPIRIVA) 18 MCG inhalation capsule Place 1 capsule (18 mcg total) into inhaler and inhale daily. (Patient not taking: Reported on 10/21/2015)   Facility-Administered Encounter Medications as of 10/21/2015  Medication  . sodium chloride 0.9 % injection 10 mL    Orders for this visit: Orders Placed This Encounter  Procedures  . CT Chest W Contrast    Standing Status: Future     Number of Occurrences:      Standing Expiration  Date: 12/18/2016    Order Specific Question:  If indicated for the ordered procedure, I authorize the administration of contrast media per Radiology protocol    Answer:  Yes    Order Specific Question:  Reason for Exam (SYMPTOM  OR DIAGNOSIS REQUIRED)    Answer:  sob, history of lung cancer    Order Specific Question:  Preferred imaging location?    Answer:  [facility]  . Basic metabolic panel    Standing Status: Future     Number of Occurrences:      Standing Expiration Date: 10/20/2016  . Ambulatory referral to Internal Medicine    Referral Priority:  Routine    Referral Type:  Consultation    Referral Reason:  Specialty Services Required    Requested Specialty:  Internal Medicine    Number of Visits Requested:  1     Thank  you for the consultation and for allowing [facility] to assist in the care of your patient. Our recommendations are noted above.  Please contact us if we can be of further service.   Vilinda Boehringer, MD [facility] Office Number: [phone]

## 2015-10-21 NOTE — Assessment & Plan Note (Addendum)
Patient was formerly a nonsmoker, subsequently diagnosed with stage III non-small cell lung cancer. Over the last year has been on intermittent doses of Advair, Spiriva, patient not consistent with use. At this time she does have components of radiation pneumonitis/fibrosis, we'll hold her inhalers due to intermittent use and financial issues. While patient should be on a maintenance inhaler, given her lung findings of continued right hilar enlargement, new left lower lobe mass, suspected radiation pneumonitis, she'll be in a chronic inflammatory state, and will subsequently be placed on prednisone. In addition, dry powder inhaler can cause worsening cough and bronchospasms, therefore will give a trial of inhaler cessation for the next 6-8 weeks.  Plan: -Patient will be on prednisone tapering doses over the next 6-8 weeks -Rescue inhaler, albuterol, as needed for shortness of breath, wheezing, persistent coughing spells

## 2015-10-21 NOTE — Assessment & Plan Note (Addendum)
left lower lobe measuring 3.3 x 4.7 cm  CT scan chest 10/06/2015 Workup for this include recent bronchoscopy with ENB of left lower lobe mass and EBUS FNA of the right hilum - current path is negative for both Other reasons includes postobstructive pneumonia, rounded pneumonia, atelectasis.  Plan: -Given negative pathology from recent workup from bronchoscopy, we'll treat as pneumonia, -We'll treat as postobstructive, severe aspiration, pneumonia - we'll give Augmentin 500 mg, 1 tab daily for 6 weeks and then repeat CT chest with contrast -If symptoms and/or masses not improving on follow-up CAT scan will plan for repeat bronchoscopy with BAL, this was discussed with the patient and her son, who are both in agreement  ADDENDUM: - I have spoken with Dr. Oliva Bustard, Oncology, about this patient case and management.  He states that given negative malignancy from the bronchoscopy, he previously scheduled patient for a CT guided biospy of the LLL mass, and would like to continue with this approach.  I agreed with the CT guided biopsy (which will happen this week), along with continued plan as above to treat as pneumonia, pending pathology results from the CT guided biopsy.

## 2015-10-21 NOTE — Assessment & Plan Note (Signed)
Multifactorial: Postradiation pneumonitis, fibrosis, architectural distortion, anatomical distortion of the right mainstem, right hilum inflammation, left lower lobe mass, COPD,  Plan: -Hold current inhalers which include Spiriva, and Advair -Treat radiation pneumonitis/fibrosis with prednisone -Treat left lower lobe mass is pneumonia with Augmentin -Hydrocodone as needed 1 teaspoon twice a day, as needed DuoNeb's -Repeat CT with contrast in 6 weeks

## 2015-10-23 ENCOUNTER — Encounter: Payer: Self-pay | Admitting: Oncology

## 2015-10-23 ENCOUNTER — Inpatient Hospital Stay (HOSPITAL_BASED_OUTPATIENT_CLINIC_OR_DEPARTMENT_OTHER): Payer: Medicare Other | Admitting: Oncology

## 2015-10-23 ENCOUNTER — Telehealth: Payer: Self-pay | Admitting: Internal Medicine

## 2015-10-23 VITALS — BP 94/65 | HR 112 | Temp 98.0°F | Resp 18 | Wt 153.4 lb

## 2015-10-23 DIAGNOSIS — C3401 Malignant neoplasm of right main bronchus: Secondary | ICD-10-CM

## 2015-10-23 DIAGNOSIS — C771 Secondary and unspecified malignant neoplasm of intrathoracic lymph nodes: Secondary | ICD-10-CM | POA: Diagnosis not present

## 2015-10-23 DIAGNOSIS — J440 Chronic obstructive pulmonary disease with acute lower respiratory infection: Secondary | ICD-10-CM

## 2015-10-23 DIAGNOSIS — R05 Cough: Secondary | ICD-10-CM

## 2015-10-23 DIAGNOSIS — Z87891 Personal history of nicotine dependence: Secondary | ICD-10-CM

## 2015-10-23 DIAGNOSIS — R531 Weakness: Secondary | ICD-10-CM

## 2015-10-23 DIAGNOSIS — R093 Abnormal sputum: Secondary | ICD-10-CM

## 2015-10-23 DIAGNOSIS — R0602 Shortness of breath: Secondary | ICD-10-CM

## 2015-10-23 DIAGNOSIS — R509 Fever, unspecified: Secondary | ICD-10-CM

## 2015-10-23 DIAGNOSIS — Z9221 Personal history of antineoplastic chemotherapy: Secondary | ICD-10-CM | POA: Diagnosis not present

## 2015-10-23 DIAGNOSIS — C3491 Malignant neoplasm of unspecified part of right bronchus or lung: Secondary | ICD-10-CM

## 2015-10-23 DIAGNOSIS — Z923 Personal history of irradiation: Secondary | ICD-10-CM

## 2015-10-23 DIAGNOSIS — J449 Chronic obstructive pulmonary disease, unspecified: Secondary | ICD-10-CM

## 2015-10-23 DIAGNOSIS — R5383 Other fatigue: Secondary | ICD-10-CM

## 2015-10-23 LAB — PAN-ANCA
Atypical P-ANCA titer: <1:20 {titer}
P-ANCA: <1:20 {titer}

## 2015-10-23 MED ORDER — HYDROCOD POLST-CPM POLST ER 10-8 MG/5ML PO SUER: 5.0000 mL | Freq: Three times a day (TID) | ORAL | Status: DC | PRN

## 2015-10-23 NOTE — Progress Notes (Signed)
[place] @ [facility] Telephone:([phone]) [phone]  Fax:(336) [phone]     Eara Burruel OB: 07/28/1936  MR#: [id number]  OZD#:[id number]  Patient Care Team: Forest Gleason, MD as PCP - General (Oncology) Minna Merritts, MD as Consulting Physician (Cardiology) Vilinda Boehringer, MD as Consulting Physician (Internal Medicine)  CHIEF COMPLAINT:  Chief Complaint  Patient presents with  . 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   Patient had compression of the right mainstem bronchus because of enlarged lymph node and a mass. Stage is T4 N1 M0 tumor stage IIIa diagnosis in January of 2016 2.started on radiation and chemotherapy from October 21, 2014 3.finished 6 cycles of carboplatinum and Taxol  in March  29 th of 2016, As finished concurrent chemoradiation therapy and PET scan shows significant response 4. started on consolidation chemotherapy with carboplatinum and Taxol 2 cycle   Patient finished 2 cycles of consultation therapy on March 06, 2015   5.  Atrial fibrillation diagnosis in August of 2016 on  eloquis 6.  Repeat bronchoscopy was negative for any malignancy      INTERVAL HISTORY: 80 year old lady was finished chemoradiation therapy came today further follow-up regarding carcinoma of lung.   Patient was gradually improving however in last few days started increasing cough yellowish expectoration.  Low-grade fever.  Patient is also making a trip to [place] extremely anxious. Patient continues to have increasing shortness of breath and cough . Patient has been referred to pulmonologist for evaluation.  No hemoptysis.  No chest pain.  Appetite has been stable.  RI had then was seen by pulmonologist Dr. Stevenson Clinch. .  He started patient on 50 mg of prednisone gradually tapering dose and amoxicillin.   Patient has not started that medicine yet.  Patient originally was scheduled for biopsy on 27th of February but family today expressed their strong desire not to pursue biopsy at present time.   Increasing shortness of breath.  Feeling weak and tired. REVIEW OF SYSTEMS:    general status: Patient is feeling weak and tired.. Gradual improvement in the strength or pure of last few days  No change in a performance status.  No chills.  No fever. HEENT: Alopecia, resolving.  No evidence of stomatitis Lungs: Patient started increasing cough , increasing shortness of breath Cardiac: No chest pain or paroxysmal nocturnal dyspnea GI: No nausea no vomiting no diarrhea no abdominal pain Skin: No rash Lower extremity no swelling Neurological system: Grade 1 neuropathy  No other focal signs  Musculoskeletal system no bony pains  As per HPI. Otherwise, a complete review of systems is negatve.  PAST MEDICAL HISTORY: Past Medical History  Diagnosis Date  . HTN (hypertension)   . Pacemaker     a. MDT 2002; b. generator replacement 2013; c. followed by Dr. Omelia Blackwater, MD  . HLD (hyperlipidemia)   . CAD (coronary artery disease)     a. s/p MI x 2 in 2002 s/p PCI x 2 in 2002; b. s/p 2v CABG 2002; c. stress echo 07/2004 w/ evi of pos & inf infarct & no evi of ischemia; d. 4/08 dipyridamole scan w/ multiple areas of infarct, no ischemia, EF 49%; e. cath 04/28/15 3v CAD, med Rx rec, no targets for revasc, LM lum irregs, pLAD 30%, 100%, ost-pLCx 60%, [name] 99%, OM2 100%, p-mRCA  90%, [name] 100% L-R collats, VG-mLAD irregs, VG-OM2 oc  . Lung cancer ([place])   . History of colonoscopy 2013  . History of mammography, screening 2015  . History of Papanicolaou smear of cervix 2013  . Carcinoma of right lung ([place]) 01/03/2015    a. followed by Dr. Oliva Bustard  . Mitral regurgitation     a. s/p mitral ring placement 09/2000; b. echo 09/2010: EF 50%, inf HK, post AK, mild MR, prosthetic mitral valve ring w/ peak gradient of 10 mmHg;  b. echo 2/13: EF 50%, mild MR/TR     . History of blood clots     12/2001  . Atypical atrial flutter ([place])     a. s/p ablation 07/27/2013  . PAF (paroxysmal atrial fibrillation) ([facility])     a. on Eliquis   . COPD (chronic obstructive pulmonary disease) ([place])   . Chronic systolic CHF (congestive heart failure) ([name])     a. echo 03/2015: EF 30-35%, sev ant/inf/pos HK, in mild to mod MR  . Myocardial infarction ([facility])     X 2 (LAST ONE IN 200)  . Hypothyroidism   . GERD (gastroesophageal reflux disease)   . Neuropathy ([place])     PAST SURGICAL HISTORY: Past Surgical History  Procedure Laterality Date  . Appendectomy    . Vaginal hysterectomy    . Cardiac bypass    . Pacemaker insertion N/A   . Cardiac catheterization N/A 04/28/2015    Procedure: Left Heart Cath and Coronary Angiography;  Surgeon: Wellington Hampshire, MD;  Location: [facility];  Service: Cardiovascular;  Laterality: N/A;  . Endobronchial ultrasound N/A 10/06/2015    Procedure: ENDOBRONCHIAL ULTRASOUND;  Surgeon: Flora Lipps, MD;  Location: [facility];  Service: Cardiopulmonary;  Laterality: N/A;  . Electromagnetic navigation brochoscopy N/A 10/06/2015    Procedure: ELECTROMAGNETIC NAVIGATION BRONCHOSCOPY;  Surgeon: Flora Lipps, MD;  Location: [facility];  Service: Cardiopulmonary;  Laterality: N/A;    FAMILY HISTORY Family History  Problem Relation Age of Onset  . COPD Mother     sister, and brother        ADVANCED DIRECTIVES:Patient does not have any advanced healthcare directive. Information has been given. HEALTH MAINTENANCE: Social History  Substance Use Topics  . Smoking status: Former Smoker -- 2.00 packs/day for 42 years    Types: Cigarettes    Quit date: 08/30/2000  . Smokeless tobacco: None     Comment: quit smoking in 08/28/2000  . Alcohol Use: 6.0 oz/week    10 Glasses of wine per week     Comment: 2 glasses of wine per day     Allergies  Allergen Reactions  . Lovenox [Enoxaparin Sodium]  Itching  . Meperidine Other (See Comments)    Other Reaction: pt does not like how it makes her feel    Current Outpatient Prescriptions  Medication Sig Dispense Refill  . amoxicillin-clavulanate (AUGMENTIN) 500-125 MG tablet Take 1 tablet (500 mg total) by mouth 3 (three) times daily. 45 tablet 0  . apixaban (ELIQUIS) 5 MG TABS tablet Take 5 mg by mouth 2 (two) times daily.    Marland Kitchen atorvastatin (LIPITOR) 20 MG tablet Take 20 mg by mouth daily at 6 PM.     . Cetirizine HCl 10 MG CAPS Take 1 capsule (10 mg total) by mouth 1 day or 1 dose. 30 capsule 5  . ipratropium-albuterol (DUONEB) 0.5-2.5 (3) MG/3ML SOLN TAKE 3ML BY NEBULIZER EVERY 6 HOURS AS NEEDED  2  . levothyroxine (SYNTHROID, LEVOTHROID) 75  MCG tablet Take 75 mcg by mouth daily before breakfast.    . metoprolol succinate (TOPROL XL) 25 MG 24 hr tablet Take 1 tablet (25 mg total) by mouth daily. Take 13m every morning and 565mevery evening 90 tablet 5  . omeprazole (PRILOSEC) 20 MG capsule Take 20 mg by mouth every morning.     . phenol-menthol (CEPASTAT) 14.5 MG lozenge Place 1 lozenge inside cheek as needed for sore throat. 100 tablet 0  . predniSONE (DELTASONE) 20 MG tablet Take 4061mor 3 wk then starting on 11/11/15 start taking 33m27mtil follow up 82 tablet 0  . sacubitril-valsartan (ENTRESTO) 24-26 MG Take 1 tablet by mouth 2 (two) times daily. 60 tablet 5  . chlorpheniramine-HYDROcodone (TUSSIONEX PENNKINETIC ER) 10-8 MG/5ML SUER Take 5 mLs by mouth 3 (three) times daily as needed for cough. 140 mL 0  . levofloxacin (LEVAQUIN) 500 MG tablet TAKE 1 TABLET (500 MG TOTAL) BY MOUTH DAILY.  0  . MUCINEX 600 MG 12 hr tablet TAKE 1 TABLET (600 MG TOTAL) BY MOUTH 2 (TWO) TIMES DAILY.  0   No current facility-administered medications for this visit.   Facility-Administered Medications Ordered in Other Visits  Medication Dose Route Frequency Provider Last Rate Last Dose  . sodium chloride 0.9 % injection 10 mL  10 mL Intravenous PRN  JanaForest Gleason   10 mL at 02/19/15 1000    OBJECTIVE:  Filed Vitals:   10/23/15 0919  BP: 94/65  Pulse: 112  Temp: 98 F (36.7 C)  Resp: 18     Body mass index is 24.03 kg/(m^2).    ECOG FS:1 - Symptomatic but completely ambulatory  PHYSICAL EXAM: GENERAL:  Patient's condition has been gradually declining.  Has increasing shortness of breath.  Feeling more and more weak and tired. MENTAL STATUS:  Alert and oriented to person, place and time. HEAD: alopecia Normocephalic, atraumatic, face symmetric, no Cushingoid features. EYES: eyes.  Pupils equal round and reactive to light and accomodation.  No conjunctivitis or scleral icterus. ENT:  Oropharynx clear without lesion.  Tongue normal. Mucous membranes moist.  RESPIRATORY:  Occasional rhonchi CARDIOVASCULAR:  Regular rate and rhythm without murmur, rub or gallop. . ABDOMEN:  Soft, non-tender, with active bowel sounds, and no hepatosplenomegaly.  No masses. BACK:  No CVA tenderness.  No tenderness on percussion of the back or rib cage. SKIN:  No rashes, ulcers or lesions. EXTREMITIES: No edema, no skin discoloration or tenderness.  No palpable cords. LYMPH NODES: No palpable cervical, supraclavicular, axillary or inguinal adenopathy  NEUROLOGICAL: Unremarkable. PSYCH:  Appropriate.   LAB RESULTS:  No visits with results within 3 Day(s) from this visit. Latest known visit with results is:  Admission on 10/06/2015, Discharged on 10/07/2015  Component Date Value Ref Range Status  . CYTOLOGY - NON GYN 10/06/2015    Final                   Value:Cytology - Non PAP CASE: [id number] PATIENT: Shali Inglis Non-Gyn Cytology Report     SPECIMEN SUBMITTED: A. Lung, left lower lobe mass, brushing  CLINICAL HISTORY: History of squamous cell carcinoma right hilum, status post chemo / radiation. Now with LLL lung lesion and persistent soft tissue density right hilum  PRE-OPERATIVE DIAGNOSIS: LLL mass  POST-OPERATIVE  DIAGNOSIS: Same as pre-op     DIAGNOSIS: A. LUNG MASS, LEFT LOWER LOBE; BRUSHING: - BRONCHIAL CELLS AND MACROPHAGES. - NEGATIVE FOR MALIGNANCY.  Comment: Please see concurrent cases [id number] and  35, [id number].   GROSS DESCRIPTION: A. Site: Left lower lobe mass Procedure: ENB Cytotechnologist: Georgina Snell, Zyretta Gordon Specimen(s) collected: 3 Diff Quik stained slides      Volume: 30 mL      Description: Clear fluid with 3 collection brushes      Submitted for:ThinPrep  Final Diagnosis performed by Quay Burow, MD.  Electronically signed 10/07/2015 2:47:25PM    The elec                         tronic signature indicates that the named Attending Pathologist has evaluated the specimen  Technical component performed at [facility], [address], [name], [place] [address] Lab: [phone] Dir: Darrick Penna. Evette Doffing, MD  Professional component performed at [facility], [facility], [place], [place], [place] [address] Lab: [phone] Dir: Dellia Nims. Rubinas, MD    . CYTOLOGY - NON GYN 10/06/2015    Final                   Value:Cytology - Non PAP CASE: [id number] PATIENT: Kelsie Mahar Non-Gyn Cytology Report     SPECIMEN SUBMITTED: A. FNA, right hilar mass  CLINICAL HISTORY: History of squamous cell carcinoma right hilum, status post chemo / radiation. Now with LLL lung lesion and persistent soft tissue density right hilum  PRE-OPERATIVE DIAGNOSIS: Right hilar tissue density  POST-OPERATIVE DIAGNOSIS: Large dense right hilar mass     DIAGNOSIS: A. HILAR MASS, RIGHT; EBUS ASSISTED FNA: - BLOOD CLOT WITH OCCASIONAL BRONCHIAL CELLS AND MACROPHAGES. - NEGATIVE FOR MALIGNANCY.  Comment: Please see concurrent cases [id number], [id number], 35.   GROSS DESCRIPTION: A. Site: Right hilar mass Procedure: EBUS Cytotechnologist: Jeanelle Malling Zyretta Gordon Specimen(s) collected: 3 Diff Quik stained slides 3 Pap stained slides       Volume: 30 mL      Description: Pink red fluid with large amount of pale tan to red soft material      Submitted for:           ThinPrep                                    Cell block(s): 3  Final Diagnosis performed by Quay Burow, MD.  Electronically signed 10/07/2015 2:54:19PM    The electronic signature indicates that the named Attending Pathologist has evaluated the specimen  Technical component performed at [facility], [address] [place], [place] [address] Lab: [phone] Dir: Darrick Penna. Evette Doffing, MD  Professional component performed at [facility], [facility], [place], [place], [place] [address] Lab: [phone] Dir: Dellia Nims. Reuel Derby, MD    . SURGICAL PATHOLOGY 10/06/2015    Final                   Value:Surgical Pathology CASE: [id number] PATIENT: Terri Skains Surgical Pathology Report     SPECIMEN SUBMITTED: A. Lung, left lower lobe mass, biopsy  CLINICAL HISTORY: History of squamous cell carcinoma right hilum, status post chemo / radiation. Now with LLL lung lesion and persistent soft tissue density right hilum  PRE-OPERATIVE DIAGNOSIS: LLL lung mass  POST-OPERATIVE DIAGNOSIS: Same as pre-op     DIAGNOSIS: A. LUNG MASS, LEFT LOWER LOBE; ENB ASSISTED FORCEPS BIOPSY: - NEGATIVE FOR MALIGNANCY. - SMALL FRAGMENTS OF BLOOD CLOT, BENIGN BRONCHIAL MUCOSA, AND CARTILAGE.  Comment: See concurrent cases [id number], and 35.  GROSS DESCRIPTION: A. Site: Left lower lobe  mass Procedure: ENB Cytotechnologist: Micheline Rough, Ashlee Howze Specimen(s) collected: 1 Diff Quik stained touch prep slide Tissue fragment(s): 4  Size: From 0.1 x 0.1 up to 0.5 x 0.1 cm  Description: Pink red cylindrically shaped tissue fragments and blood clot  Entirely su                         bmitted in 2 cassette(s).   Final Diagnosis performed by Quay Burow, MD.  Electronically signed 10/07/2015 2:41:48PM    The electronic signature  indicates that the named Attending Pathologist has evaluated the specimen  Technical component performed at [facility], [address], [place], [place] [address] Lab: [phone] Dir: Darrick Penna. Evette Doffing, MD  Professional component performed at [facility], [facility], [place], [place], [place] [address] Lab: [phone] Dir: Dellia Nims. Rubinas, MD    . CYTOLOGY - NON GYN 10/06/2015    Final                   Value:Cytology - Non PAP CASE: [id number] PATIENT: Shametra Fairley Non-Gyn Cytology Report     SPECIMEN SUBMITTED: A. FNA, lung, left lower lobe mass  CLINICAL HISTORY: History of squamous cell carcinoma right hilum, status post chemo / radiation. Now with LLL lung lesion and persistent soft tissue density right hilum  PRE-OPERATIVE DIAGNOSIS: LLL mass  POST-OPERATIVE DIAGNOSIS: Same as pre-op     DIAGNOSIS: A. LUNG MASS, LEFT LOWER LOBE; ENB ASSISTED FNA: - NEGATIVE FOR MALIGNANCY. - BRONCHIAL CELLS AND MACROPHAGES.  Comment: Please see concurrent cases [id number], [id number] and 34.   GROSS DESCRIPTION: A. Site: Single needle aspirate left lower lobe mass Procedure: ENB Cytotechnologist: Micheline Rough, Ashley Howze Specimen(s) collected: 3 Diff Quik stained slides 3 Pap stained slides      Volume: 30 mL      Description: Pink red fluid with a small amount of red soft material      Submitted for:           ThinPrep           Cell block(s): 1                            Final Diagnosis performed by Quay Burow, MD.  Electronically signed 10/07/2015 3:05:50PM    The electronic signature indicates that the named Attending Pathologist has evaluated the specimen  Technical component performed at [facility], [address] [place], [place] [address] Lab: [phone] Dir: Darrick Penna. Evette Doffing, MD  Professional component performed at [facility], [facility], [place], [place], [place] [address] Lab:  [phone] Dir: Dellia Nims. Rubinas, MD    . WBC 10/06/2015 6.8  3.6 - 11.0 K/uL Final  . RBC 10/06/2015 3.94  3.80 - 5.20 MIL/uL Final  . Hemoglobin 10/06/2015 10.7* 12.0 - 16.0 g/dL Final  . HCT 10/06/2015 33.4* 35.0 - 47.0 % Final  . MCV 10/06/2015 84.7  80.0 - 100.0 fL Final  . MCH 10/06/2015 27.2  26.0 - 34.0 pg Final  . MCHC 10/06/2015 32.2  32.0 - 36.0 g/dL Final  . RDW 10/06/2015 20.2* 11.5 - 14.5 % Final  . Platelets 10/06/2015 280  150 - 440 K/uL Final  . Sodium 10/06/2015 139  135 - 145 mmol/L Final  . Potassium 10/06/2015 4.6  3.5 - 5.1 mmol/L Final  . Chloride 10/06/2015 105  101 - 111 mmol/L Final  . CO2 10/06/2015 24  22 -  32 mmol/L Final  . Glucose, Bld 10/06/2015 150* 65 - 99 mg/dL Final  . BUN 10/06/2015 23* 6 - 20 mg/dL Final  . Creatinine, Ser 10/06/2015 0.97  0.44 - 1.00 mg/dL Final  . Calcium 10/06/2015 9.0  8.9 - 10.3 mg/dL Final  . GFR calc non Af Amer 10/06/2015 55* >60 mL/min Final  . GFR calc Af Amer 10/06/2015 >60  >60 mL/min Final   Comment: (NOTE) The eGFR has been calculated using the CKD EPI equation. This calculation has not been validated in all clinical situations. eGFR's persistently <60 mL/min signify possible Chronic Kidney Disease.   . Anion gap 10/06/2015 10  5 - 15 Final  . MRSA by PCR 10/06/2015 NEGATIVE  NEGATIVE Final   Comment:        The GeneXpert MRSA Assay (FDA approved for NASAL specimens only), is one component of a comprehensive MRSA colonization surveillance program. It is not intended to diagnose MRSA infection nor to guide or monitor treatment for MRSA infections.   . Sodium 10/07/2015 139  135 - 145 mmol/L Final  . Potassium 10/07/2015 4.5  3.5 - 5.1 mmol/L Final  . Chloride 10/07/2015 102  101 - 111 mmol/L Final  . CO2 10/07/2015 29  22 - 32 mmol/L Final  . Glucose, Bld 10/07/2015 165* 65 - 99 mg/dL Final  . BUN 10/07/2015 25* 6 - 20 mg/dL Final  . Creatinine, Ser 10/07/2015 1.02* 0.44 - 1.00 mg/dL Final  . Calcium  10/07/2015 9.5  8.9 - 10.3 mg/dL Final  . GFR calc non Af Amer 10/07/2015 52* >60 mL/min Final  . GFR calc Af Amer 10/07/2015 60* >60 mL/min Final   Comment: (NOTE) The eGFR has been calculated using the CKD EPI equation. This calculation has not been validated in all clinical situations. eGFR's persistently <60 mL/min signify possible Chronic Kidney Disease.   . Anion gap 10/07/2015 8  5 - 15 Final  . WBC 10/07/2015 5.6  3.6 - 11.0 K/uL Final  . RBC 10/07/2015 3.96  3.80 - 5.20 MIL/uL Final  . Hemoglobin 10/07/2015 10.7* 12.0 - 16.0 g/dL Final  . HCT 10/07/2015 33.7* 35.0 - 47.0 % Final  . MCV 10/07/2015 85.1  80.0 - 100.0 fL Final  . MCH 10/07/2015 27.0  26.0 - 34.0 pg Final  . MCHC 10/07/2015 31.7* 32.0 - 36.0 g/dL Final  . RDW 10/07/2015 20.2* 11.5 - 14.5 % Final  . Platelets 10/07/2015 266  150 - 440 K/uL Final   PET scan has been reviewed independently Lab data has been reviewed.  Assessment and plan Stage III non-small cell carcinoma of lung..  At patient's request we reviewed again PET scan with patient and her family. Dr. Was present.  At present time following options had been discussed. 1. Continue suggestion as per Dr. Stevenson Clinch  Of starting prednisone and amoxicillin and repeat CT scan in next few weeks 2. High suspicion of recurrent disease consider patient for the biopsy of the left lower lobe mass  Patient desires to wait for 2 more weeks and a repeat CT scan shows progressive disease then would proceed with biopsy.  A stability of typical infection, fungal infection, pneumonitis with unknown specific cause versus recurrent and progressive disease are the differential diagnosis and that has been discussed with the patient and family in detail.  The scan in 4 weeks and reevaluate patient  Discussed that situation with pulmonologist and he is in agreement with that  Total duration of visit was 25 minutes.  50% or more time was spent in counseling patient and family  regarding prognosis and options of treatment and available resources  Patient  is off prednisone    Forest Gleason, MD   10/23/2015 4:31 PM

## 2015-10-23 NOTE — Telephone Encounter (Signed)
Pharmacy states this medication has been d/c'd since 2005. Please advise on another medication.

## 2015-10-23 NOTE — Telephone Encounter (Signed)
Pt daughter is calling stating we have them a prescription onHYDROcodone-GuaiFENesin  The pharmacy they went to told them that is not a medication and does not exist Would like some clarification.  Please advise

## 2015-10-23 NOTE — Telephone Encounter (Signed)
Spoke with pt and informed I have a different rx for her to pick up. Nothing further needed.

## 2015-10-23 NOTE — Telephone Encounter (Signed)
Tussionex Pennkinetic ('8mg'$ /'10mg'$ )/78m 1 teaspoon TID prn severe cough Dispense - 1 bottle Refill - 0

## 2015-10-23 NOTE — Progress Notes (Signed)
Patient saw Dr. Stevenson Clinch yesterday.  Was started on prednisone, Augmentin and guifenison cough medication.  States she is here to discuss next treatment plan with MD.

## 2015-10-24 IMAGING — CR DG CHEST 2V
1 series · 2 of 2 positions shown · non-contrast
Comparison: Chest x-ray 04/01/2015.

CLINICAL DATA: 79-year-old female with history of cough and
shortness of breath.

EXAM:
CHEST  2 VIEW

[Series 1: dg chest 2 view · 0.14mm/px · 2 of 2 slices shown]
[im 1/2]
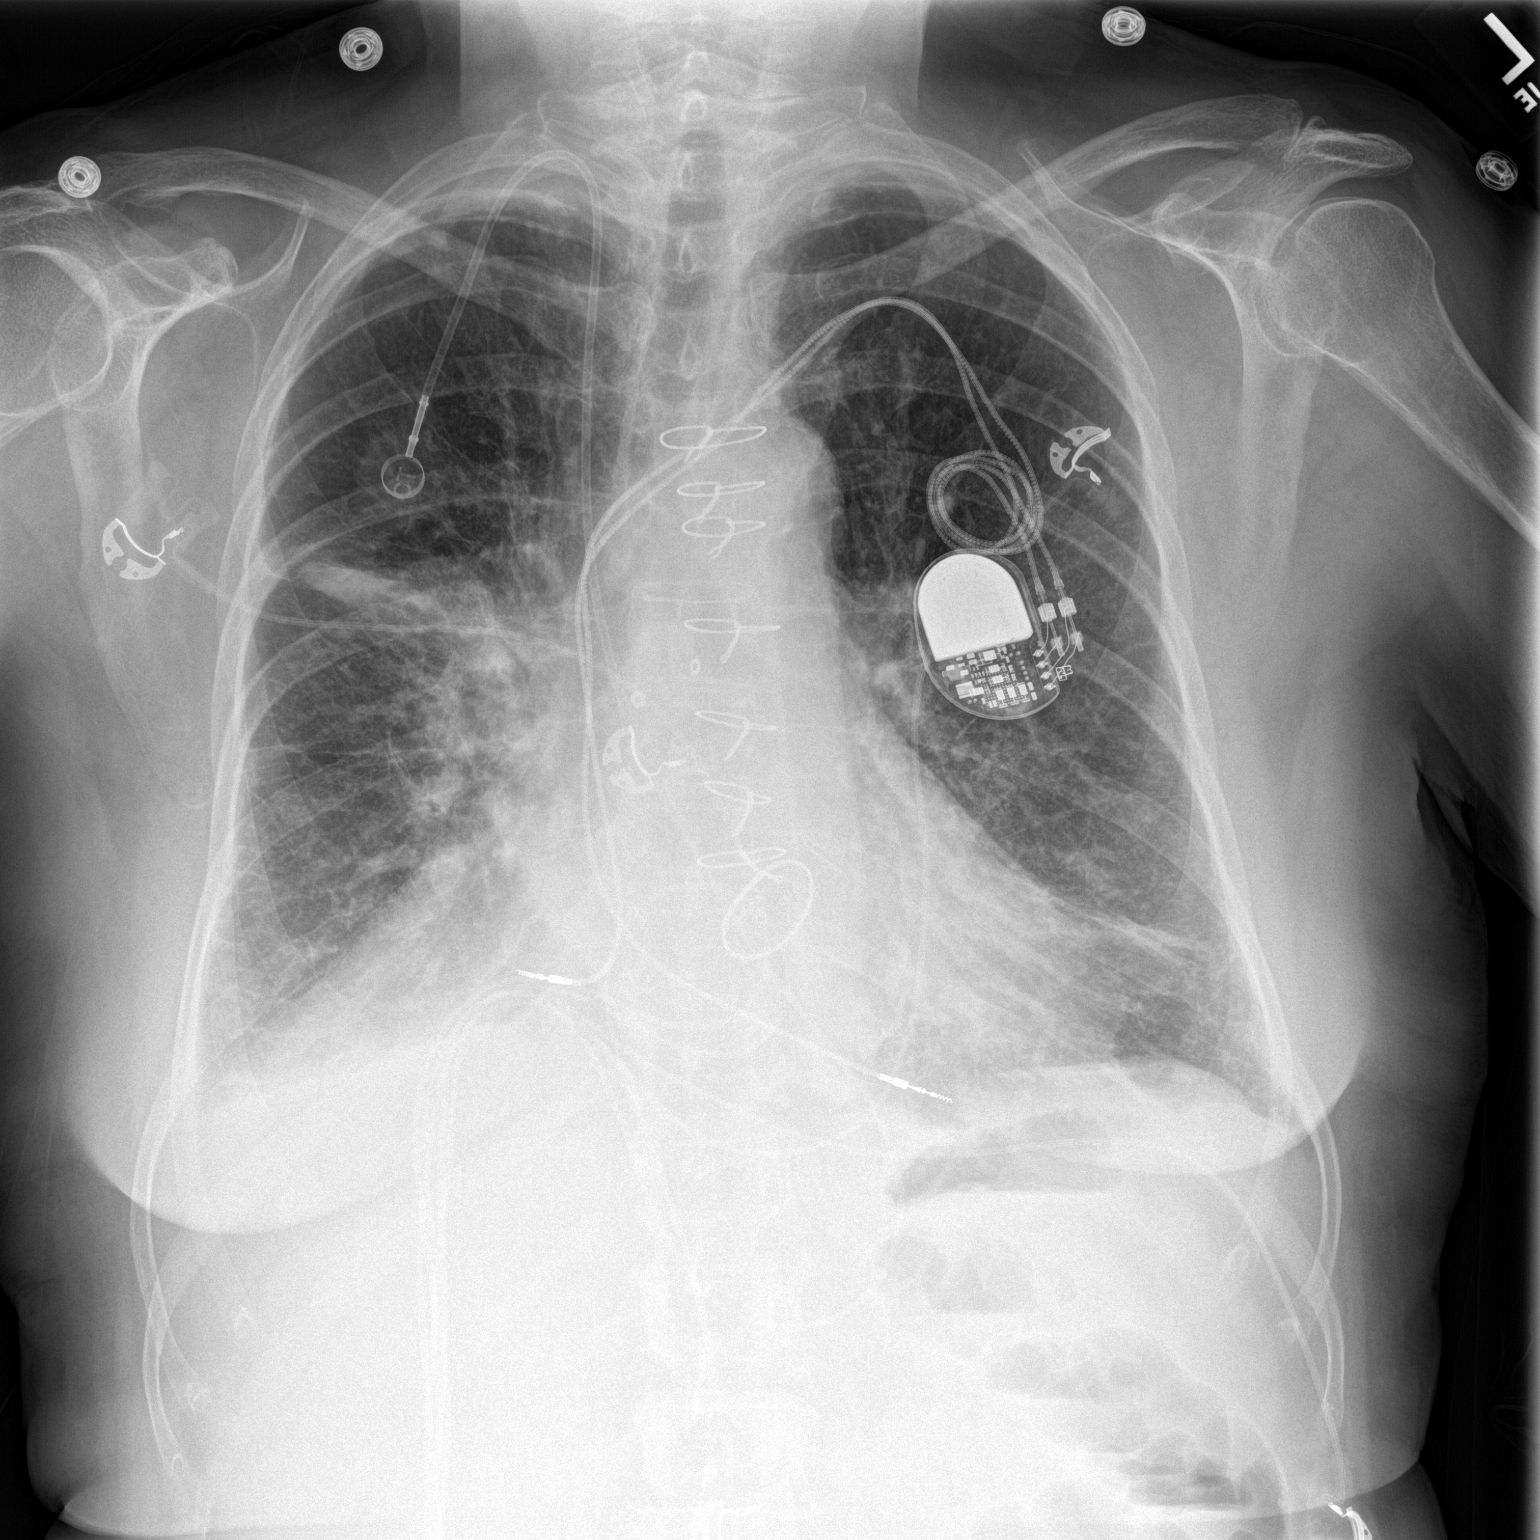
[im 2/2]
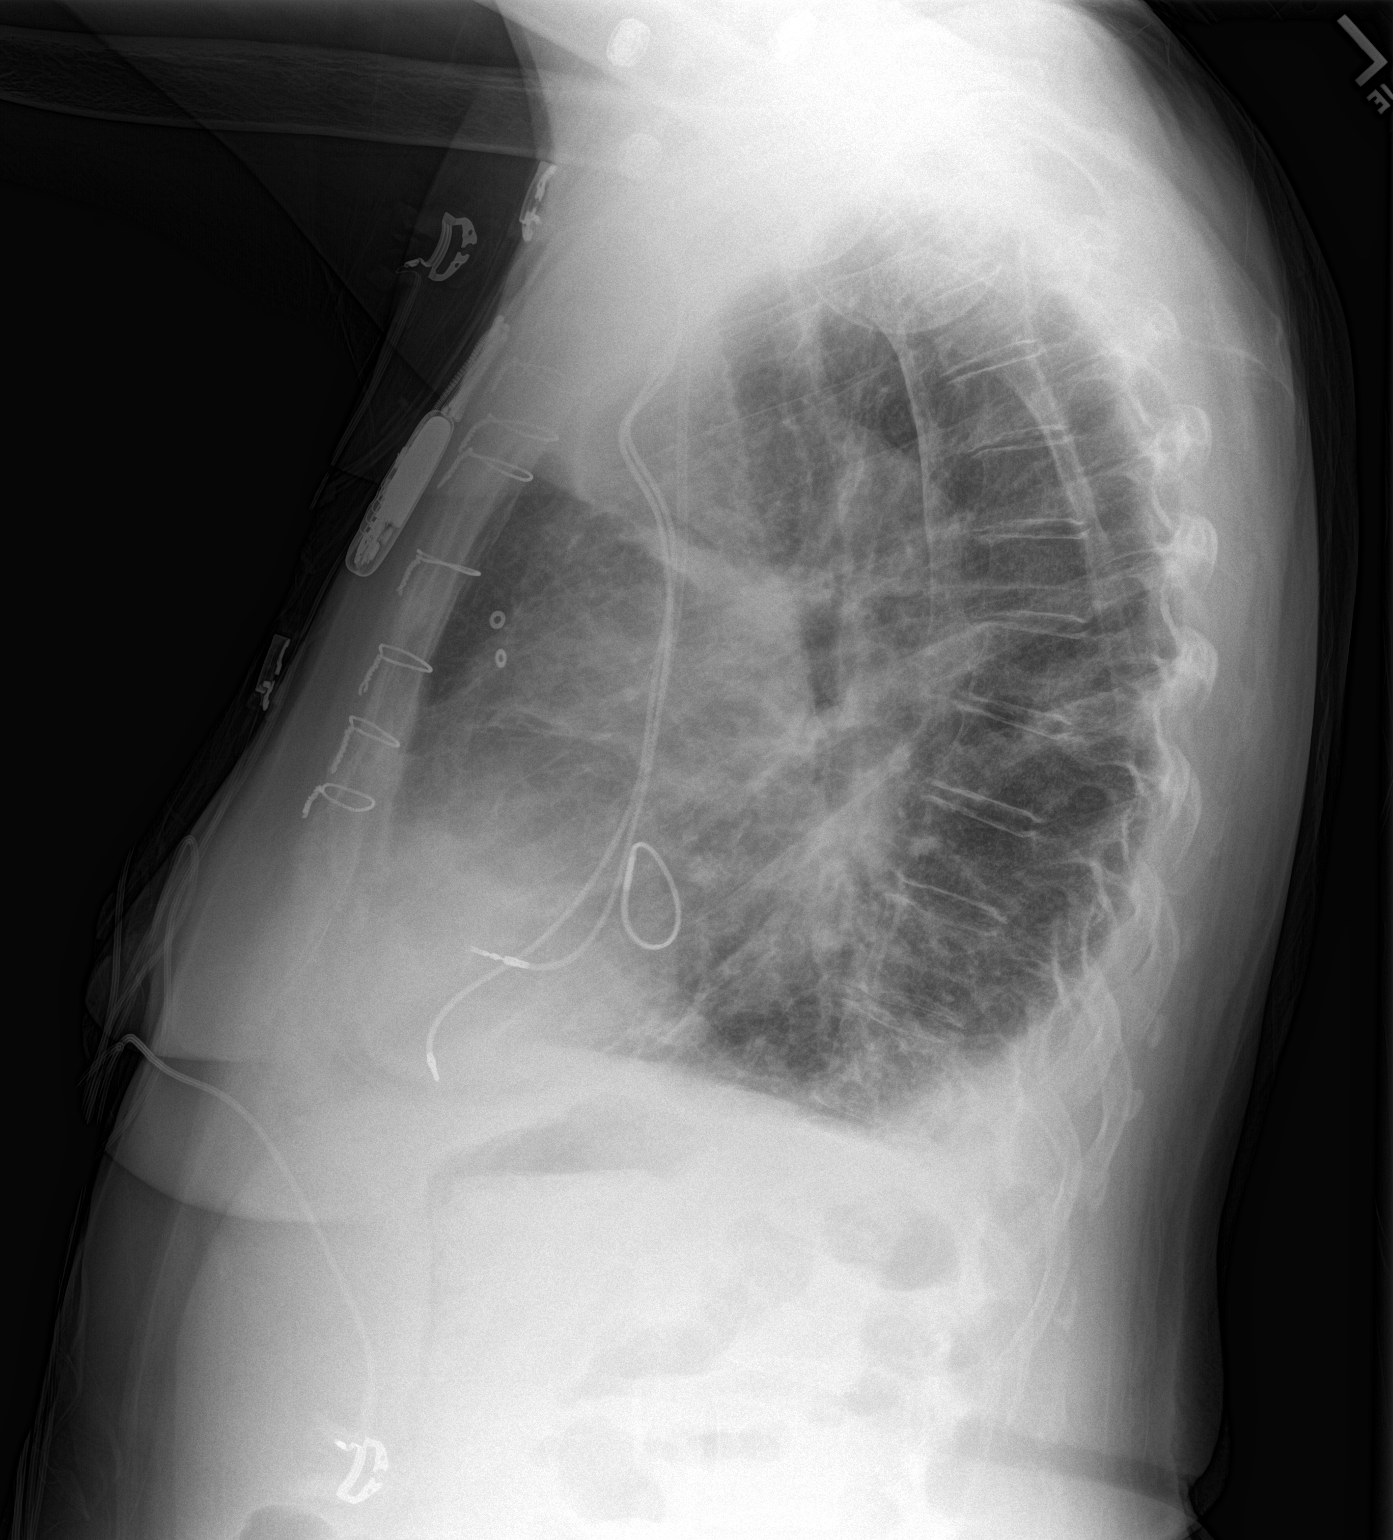

[2 of 2 positions shown; findings below may reference images not displayed]

FINDINGS: Right internal jugular single-lumen porta cath with tip terminating
in the distal superior vena cava. Left-sided pacemaker in position
with lead tips projecting over the expected location of the right
atrium and right ventricular apex. Status post median sternotomy for
CABG and mitral valve annuloplasty. Moderate right and small left
pleural effusion. Patchy areas of airspace consolidation are noted
throughout the right mid to lower lung and in the left lower lung,
concerning for multilobar pneumonia. Overall, these are improved
compared to prior study 04/01/2015. Small bilateral pleural
effusions. No evidence of pulmonary edema. Heart size is normal.
Right hilar fullness is similar to the prior examination. Upper
mediastinal contours are slightly distorted by patient's rotation to
the left. Atherosclerosis in the thoracic aorta.
IMPRESSION: 1. Improving multilobar pneumonia.
2. Small bilateral pleural effusions.
3. Persistent right hilar fullness, similar to the prior study,
which may relate in part to scarring from prior radiation therapy.
4. Postoperative changes and support apparatus, as above.

## 2015-10-27 ENCOUNTER — Ambulatory Visit: Payer: Medicare Other

## 2015-11-01 ENCOUNTER — Other Ambulatory Visit: Payer: Self-pay | Admitting: Physician Assistant

## 2015-11-01 ENCOUNTER — Encounter: Payer: Self-pay | Admitting: Oncology

## 2015-11-03 MED ORDER — OMEPRAZOLE 20 MG PO CPDR: 20.0000 mg | DELAYED_RELEASE_CAPSULE | ORAL | Status: DC

## 2015-11-10 ENCOUNTER — Ambulatory Visit
Admission: RE | Admit: 2015-11-10 | Discharge: 2015-11-10 | Disposition: A | Discharge status: Home / Self Care | Payer: Medicare Other | Source: Ambulatory Visit | Attending: Oncology | Admitting: Oncology

## 2015-11-10 DIAGNOSIS — R918 Other nonspecific abnormal finding of lung field: Secondary | ICD-10-CM | POA: Diagnosis not present

## 2015-11-10 DIAGNOSIS — Z923 Personal history of irradiation: Secondary | ICD-10-CM | POA: Diagnosis not present

## 2015-11-10 DIAGNOSIS — C3491 Malignant neoplasm of unspecified part of right bronchus or lung: Secondary | ICD-10-CM

## 2015-11-10 DIAGNOSIS — R0602 Shortness of breath: Secondary | ICD-10-CM | POA: Insufficient documentation

## 2015-11-11 ENCOUNTER — Inpatient Hospital Stay: Payer: Medicare Other | Attending: Oncology | Admitting: Oncology

## 2015-11-11 VITALS — BP 133/75 | HR 93 | Temp 96.9°F | Resp 18 | Wt 156.1 lb

## 2015-11-11 DIAGNOSIS — C771 Secondary and unspecified malignant neoplasm of intrathoracic lymph nodes: Secondary | ICD-10-CM | POA: Diagnosis not present

## 2015-11-11 DIAGNOSIS — Z7952 Long term (current) use of systemic steroids: Secondary | ICD-10-CM | POA: Diagnosis not present

## 2015-11-11 DIAGNOSIS — R05 Cough: Secondary | ICD-10-CM

## 2015-11-11 DIAGNOSIS — I5022 Chronic systolic (congestive) heart failure: Secondary | ICD-10-CM | POA: Insufficient documentation

## 2015-11-11 DIAGNOSIS — G620 Drug-induced polyneuropathy: Secondary | ICD-10-CM

## 2015-11-11 DIAGNOSIS — Z7901 Long term (current) use of anticoagulants: Secondary | ICD-10-CM | POA: Insufficient documentation

## 2015-11-11 DIAGNOSIS — C3491 Malignant neoplasm of unspecified part of right bronchus or lung: Secondary | ICD-10-CM

## 2015-11-11 DIAGNOSIS — Z923 Personal history of irradiation: Secondary | ICD-10-CM

## 2015-11-11 DIAGNOSIS — I2510 Atherosclerotic heart disease of native coronary artery without angina pectoris: Secondary | ICD-10-CM | POA: Diagnosis not present

## 2015-11-11 DIAGNOSIS — R5383 Other fatigue: Secondary | ICD-10-CM | POA: Diagnosis not present

## 2015-11-11 DIAGNOSIS — E039 Hypothyroidism, unspecified: Secondary | ICD-10-CM | POA: Diagnosis not present

## 2015-11-11 DIAGNOSIS — J449 Chronic obstructive pulmonary disease, unspecified: Secondary | ICD-10-CM

## 2015-11-11 DIAGNOSIS — I10 Essential (primary) hypertension: Secondary | ICD-10-CM | POA: Insufficient documentation

## 2015-11-11 DIAGNOSIS — Z950 Presence of cardiac pacemaker: Secondary | ICD-10-CM | POA: Insufficient documentation

## 2015-11-11 DIAGNOSIS — R531 Weakness: Secondary | ICD-10-CM | POA: Diagnosis not present

## 2015-11-11 DIAGNOSIS — I484 Atypical atrial flutter: Secondary | ICD-10-CM | POA: Diagnosis not present

## 2015-11-11 DIAGNOSIS — K219 Gastro-esophageal reflux disease without esophagitis: Secondary | ICD-10-CM | POA: Diagnosis not present

## 2015-11-11 DIAGNOSIS — E785 Hyperlipidemia, unspecified: Secondary | ICD-10-CM | POA: Insufficient documentation

## 2015-11-11 DIAGNOSIS — I252 Old myocardial infarction: Secondary | ICD-10-CM | POA: Diagnosis not present

## 2015-11-11 DIAGNOSIS — Z79899 Other long term (current) drug therapy: Secondary | ICD-10-CM | POA: Insufficient documentation

## 2015-11-11 DIAGNOSIS — T451X5S Adverse effect of antineoplastic and immunosuppressive drugs, sequela: Secondary | ICD-10-CM | POA: Diagnosis not present

## 2015-11-11 DIAGNOSIS — R0602 Shortness of breath: Secondary | ICD-10-CM | POA: Diagnosis not present

## 2015-11-11 DIAGNOSIS — C3401 Malignant neoplasm of right main bronchus: Secondary | ICD-10-CM

## 2015-11-11 DIAGNOSIS — Z9221 Personal history of antineoplastic chemotherapy: Secondary | ICD-10-CM | POA: Diagnosis not present

## 2015-11-11 DIAGNOSIS — I480 Paroxysmal atrial fibrillation: Secondary | ICD-10-CM | POA: Diagnosis not present

## 2015-11-11 DIAGNOSIS — Z87891 Personal history of nicotine dependence: Secondary | ICD-10-CM | POA: Insufficient documentation

## 2015-11-15 ENCOUNTER — Encounter: Payer: Self-pay | Admitting: Oncology

## 2015-11-15 NOTE — Progress Notes (Signed)
[place] @ [facility] Telephone:([phone]) [phone]  [phone]     Jenny Cooper OB: 06-20-1936  MR#: [id number]  HQI#:[id number]  Patient Care Team: Forest Gleason, MD as PCP - General (Oncology) Minna Merritts, MD as Consulting Physician (Cardiology) Vilinda Boehringer, MD as Consulting Physician (Internal Medicine)  CHIEF COMPLAINT:  Chief Complaint  Patient presents with  . 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   Patient had compression of the right mainstem bronchus because of enlarged lymph node and a mass. Stage is T4 N1 M0 tumor stage IIIa diagnosis in January of 2016 2.started on radiation and chemotherapy from October 21, 2014 3.finished 6 cycles of carboplatinum and Taxol  in March  29 th of 2016, As finished concurrent chemoradiation therapy and PET scan shows significant response 4. started on consolidation chemotherapy with carboplatinum and Taxol 2 cycle   Patient finished 2 cycles of consultation therapy on March 06, 2015   5.  Atrial fibrillation diagnosis in August of 2016 on  eloquis 6.  Repeat bronchoscopy was negative for any malignancy      INTERVAL HISTORY: 80 year old lady was finished chemoradiation therapy came today further follow-up regarding carcinoma of lung.     Increasing shortness of breath.  Feeling weak and tired.. Patient is on prednisone with slow taper.  Had a repeat CT scan.  Cough and shortness of breath is improved. Patient is here for ongoing evaluation and treatment consideration REVIEW OF SYSTEMS:    general status: Patient is feeling stronger.  Cough and shortness change in a performance status.  No chills.  No fever. HEENT: Alopecia, resolving.  No evidence of stomatitis Lungs: Patient started increasing cough , increasing shortness of  breath Cardiac: No chest pain or paroxysmal nocturnal dyspnea GI: No nausea no vomiting no diarrhea no abdominal pain Skin: No rash Lower extremity no swelling Neurological system: Grade 1 neuropathy  No other focal signs  Musculoskeletal system no bony pains  As per HPI. Otherwise, a complete review of systems is negatve.  PAST MEDICAL HISTORY: Past Medical History  Diagnosis Date  . HTN (hypertension)   . Pacemaker     a. MDT 2002; b. generator replacement 2013; c. followed by Dr. Omelia Blackwater, MD  . HLD (hyperlipidemia)   . CAD (coronary artery disease)     a. s/p MI x 2 in 2002 s/p PCI x 2 in 2002; b. s/p 2v CABG 2002; c. stress echo 07/2004 w/ evi of pos & inf infarct & no evi of ischemia; d. 4/08 dipyridamole scan w/ multiple areas of infarct, no ischemia, EF 49%; e. cath 04/28/15 3v CAD, med Rx rec, no targets for revasc, LM lum irregs, pLAD 30%, 100%, ost-pLCx 60%, [name] 99%, OM2 100%, p-mRCA 90%, [name] 100% L-R collats, VG-mLAD irregs, VG-OM2 oc  . Lung cancer ([place])   . History of colonoscopy 2013  . History of mammography, screening 2015  . History of Papanicolaou smear of cervix 2013  . Carcinoma of right lung ([place]) 01/03/2015    a. followed by Dr. Oliva Bustard  . Mitral regurgitation     a. s/p mitral ring placement 09/2000; b. echo 09/2010: EF 50%, inf HK, post AK, mild MR, prosthetic mitral valve ring w/ peak gradient of 10 mmHg; b. echo 2/13: EF 50%, mild MR/TR     .  History of blood clots     12/2001  . Atypical atrial flutter ([place])     a. s/p ablation 07/27/2013  . PAF (paroxysmal atrial fibrillation) (HCC)     a. on Eliquis   . COPD (chronic obstructive pulmonary disease) ([place])   . Chronic systolic CHF (congestive heart failure) ([place])     a. echo 03/2015: EF 30-35%, sev ant/inf/pos HK, in mild to mod MR  . Myocardial infarction (HCC)     X 2 (LAST ONE IN 200)  . Hypothyroidism   . GERD (gastroesophageal reflux disease)   . Neuropathy ([place])     PAST SURGICAL HISTORY: Past  Surgical History  Procedure Laterality Date  . Appendectomy    . Vaginal hysterectomy    . Cardiac bypass    . Pacemaker insertion N/A   . Cardiac catheterization N/A 04/28/2015    Procedure: Left Heart Cath and Coronary Angiography;  Surgeon: Wellington Hampshire, MD;  Location: [facility];  Service: Cardiovascular;  Laterality: N/A;  . Endobronchial ultrasound N/A 10/06/2015    Procedure: ENDOBRONCHIAL ULTRASOUND;  Surgeon: Flora Lipps, MD;  Location: [facility];  Service: Cardiopulmonary;  Laterality: N/A;  . Electromagnetic navigation brochoscopy N/A 10/06/2015    Procedure: ELECTROMAGNETIC NAVIGATION BRONCHOSCOPY;  Surgeon: Flora Lipps, MD;  Location: [facility];  Service: Cardiopulmonary;  Laterality: N/A;    FAMILY HISTORY Family History  Problem Relation Age of Onset  . COPD Mother     sister, and brother        ADVANCED DIRECTIVES:Patient does not have any advanced healthcare directive. Information has been given. HEALTH MAINTENANCE: Social History  Substance Use Topics  . Smoking status: Former Smoker -- 2.00 packs/day for 42 years    Types: Cigarettes    Quit date: 08/30/2000  . Smokeless tobacco: None     Comment: quit smoking in 08/28/2000  . Alcohol Use: 6.0 oz/week    10 Glasses of wine per week     Comment: 2 glasses of wine per day     Allergies  Allergen Reactions  . Lovenox [Enoxaparin Sodium] Itching  . Meperidine Other (See Comments)    Other Reaction: pt does not like how it makes her feel    Current Outpatient Prescriptions  Medication Sig Dispense Refill  . apixaban (ELIQUIS) 5 MG TABS tablet Take 5 mg by mouth 2 (two) times daily.    Marland Kitchen atorvastatin (LIPITOR) 20 MG tablet Take 20 mg by mouth daily at 6 PM.     . Cetirizine HCl 10 MG CAPS Take 1 capsule (10 mg total) by mouth 1 day or 1 dose. 30 capsule 5  . chlorpheniramine-HYDROcodone (TUSSIONEX PENNKINETIC ER) 10-8 MG/5ML SUER Take 5 mLs by mouth 3 (three) times daily as needed for cough. 140  mL 0  . ENTRESTO 24-26 MG TAKE 1 TABLET BY MOUTH TWICE A DAY 60 tablet 5  . ipratropium-albuterol (DUONEB) 0.5-2.5 (3) MG/3ML SOLN TAKE 3ML BY NEBULIZER EVERY 6 HOURS AS NEEDED  2  . levothyroxine (SYNTHROID, LEVOTHROID) 75 MCG tablet Take 75 mcg by mouth daily before breakfast.    . metoprolol succinate (TOPROL XL) 25 MG 24 hr tablet Take 1 tablet (25 mg total) by mouth daily. Take '25mg'$  every morning and '50mg'$  every evening 90 tablet 5  . MUCINEX 600 MG 12 hr tablet TAKE 1 TABLET (600 MG TOTAL) BY MOUTH 2 (TWO) TIMES DAILY.  0  . omeprazole (PRILOSEC) 20 MG capsule Take 1 capsule (20 mg total) by mouth every  morning. 30 capsule 6  . predniSONE (DELTASONE) 20 MG tablet Take '40mg'$  for 3 wk then starting on 11/11/15 start taking '30mg'$  until follow up 82 tablet 0  . phenol-menthol (CEPASTAT) 14.5 MG lozenge Place 1 lozenge inside cheek as needed for sore throat. 100 tablet 0   No current facility-administered medications for this visit.   Facility-Administered Medications Ordered in Other Visits  Medication Dose Route Frequency Provider Last Rate Last Dose  . sodium chloride 0.9 % injection 10 mL  10 mL Intravenous PRN Forest Gleason, MD   10 mL at 02/19/15 1000    OBJECTIVE:  Filed Vitals:   11/11/15 1030  BP: 133/75  Pulse: 93  Temp: 96.9 F (36.1 C)  Resp: 18     Body mass index is 24.44 kg/(m^2).    ECOG FS:1 - Symptomatic but completely ambulatory  PHYSICAL EXAM: GENERAL:  Patient's condition has been gradually declining.  Has increasing shortness of breath.  Feeling more and more weak and tired. MENTAL STATUS:  Alert and oriented to person, place and time. HEAD: alopecia Normocephalic, atraumatic, face symmetric, no Cushingoid features. EYES: eyes.  Pupils equal round and reactive to light and accomodation.  No conjunctivitis or scleral icterus. ENT:  Oropharynx clear without lesion.  Tongue normal. Mucous membranes moist.  RESPIRATORY:  Occasional rhonchi CARDIOVASCULAR:  Regular  rate and rhythm without murmur, rub or gallop. . ABDOMEN:  Soft, non-tender, with active bowel sounds, and no hepatosplenomegaly.  No masses. BACK:  No CVA tenderness.  No tenderness on percussion of the back or rib cage. SKIN:  No rashes, ulcers or lesions. EXTREMITIES: No edema, no skin discoloration or tenderness.  No palpable cords. LYMPH NODES: No palpable cervical, supraclavicular, axillary or inguinal adenopathy  NEUROLOGICAL: Unremarkable. PSYCH:  Appropriate.   LAB RESULTS:  No visits with results within 3 Day(s) from this visit. Latest known visit with results is:  Appointment on 10/21/2015  Component Date Value Ref Range Status  . Myeloperoxidase Abs 10/21/2015 <9.0  0.0 - 9.0 U/mL Final  . ANCA Proteinase 3 10/21/2015 <3.5  0.0 - 3.5 U/mL Final  . C-ANCA 10/21/2015 <1:20  Neg:<1:20 titer Final  . P-ANCA 10/21/2015 <1:20  Neg:<1:20 titer Final   Comment: (NOTE) The presence of positive fluorescence exhibiting P-ANCA or C-ANCA patterns alone is not specific for the diagnosis of Wegener's Granulomatosis (WG) or microscopic polyangiitis. Decisions about treatment should not be based solely on ANCA IFA results.  The International ANCA Group Consensus recommends follow up testing of positive sera with both PR-3 and MPO-ANCA enzyme immunoassays. As many as 5% serum samples are positive only by EIA. Ref. AM J Clin Pathol 1999;111:507-513.   Marland Kitchen Atypical P-ANCA titer 10/21/2015 <1:20  Neg:<1:20 titer Final   Comment: (NOTE) The atypical pANCA pattern has been observed in a significant percentage of patients with ulcerative colitis, primary sclerosing cholangitis and autoimmune hepatitis. Performed At: [facility] [place], [place] [id number] Lindon Romp MD [id number]    PET scan has been reviewed independently Lab data has been reviewed.  Assessment and plan Stage III non-small cell carcinoma of lung.. Patient is on slow taper  prednisone CT scan as being repeated and has been reviewed independently. There is significant improvement.  By x-ray as well as symptomatically.  Recent pelvic CT scan in next few weeks and will be evaluated by pulmonologist    Forest Gleason, MD   11/15/2015 4:37 PM

## 2015-11-16 ENCOUNTER — Encounter: Payer: Self-pay | Admitting: Internal Medicine

## 2015-11-17 ENCOUNTER — Ambulatory Visit (INDEPENDENT_AMBULATORY_CARE_PROVIDER_SITE_OTHER): Payer: Medicare Other | Admitting: Cardiovascular Disease

## 2015-11-17 ENCOUNTER — Ambulatory Visit (INDEPENDENT_AMBULATORY_CARE_PROVIDER_SITE_OTHER): Payer: Medicare Other | Admitting: Internal Medicine

## 2015-11-17 ENCOUNTER — Ambulatory Visit: Payer: Medicare Other | Admitting: Family Medicine

## 2015-11-17 ENCOUNTER — Encounter: Payer: Self-pay | Admitting: Cardiovascular Disease

## 2015-11-17 ENCOUNTER — Encounter: Payer: Self-pay | Admitting: Internal Medicine

## 2015-11-17 VITALS — BP 110/64 | HR 85 | Ht 67.0 in | Wt 160.0 lb

## 2015-11-17 VITALS — BP 120/80 | HR 79 | Ht 67.0 in | Wt 158.0 lb

## 2015-11-17 DIAGNOSIS — C3491 Malignant neoplasm of unspecified part of right bronchus or lung: Secondary | ICD-10-CM

## 2015-11-17 DIAGNOSIS — I25810 Atherosclerosis of coronary artery bypass graft(s) without angina pectoris: Secondary | ICD-10-CM

## 2015-11-17 DIAGNOSIS — E039 Hypothyroidism, unspecified: Secondary | ICD-10-CM

## 2015-11-17 DIAGNOSIS — I10 Essential (primary) hypertension: Secondary | ICD-10-CM | POA: Diagnosis not present

## 2015-11-17 DIAGNOSIS — I480 Paroxysmal atrial fibrillation: Secondary | ICD-10-CM | POA: Diagnosis not present

## 2015-11-17 DIAGNOSIS — R739 Hyperglycemia, unspecified: Secondary | ICD-10-CM | POA: Diagnosis not present

## 2015-11-17 DIAGNOSIS — I5022 Chronic systolic (congestive) heart failure: Secondary | ICD-10-CM

## 2015-11-17 DIAGNOSIS — I481 Persistent atrial fibrillation: Secondary | ICD-10-CM | POA: Diagnosis not present

## 2015-11-17 DIAGNOSIS — J438 Other emphysema: Secondary | ICD-10-CM | POA: Diagnosis not present

## 2015-11-17 DIAGNOSIS — E118 Type 2 diabetes mellitus with unspecified complications: Secondary | ICD-10-CM

## 2015-11-17 DIAGNOSIS — Z86010 Personal history of colonic polyps: Secondary | ICD-10-CM | POA: Diagnosis not present

## 2015-11-17 DIAGNOSIS — I214 Non-ST elevation (NSTEMI) myocardial infarction: Secondary | ICD-10-CM | POA: Diagnosis not present

## 2015-11-17 DIAGNOSIS — M5412 Radiculopathy, cervical region: Secondary | ICD-10-CM | POA: Diagnosis not present

## 2015-11-17 DIAGNOSIS — Z860101 Personal history of adenomatous and serrated colon polyps: Secondary | ICD-10-CM

## 2015-11-17 DIAGNOSIS — E785 Hyperlipidemia, unspecified: Secondary | ICD-10-CM

## 2015-11-17 DIAGNOSIS — E119 Type 2 diabetes mellitus without complications: Secondary | ICD-10-CM | POA: Insufficient documentation

## 2015-11-17 DIAGNOSIS — I4819 Other persistent atrial fibrillation: Secondary | ICD-10-CM

## 2015-11-17 HISTORY — DX: Type 2 diabetes mellitus with unspecified complications: E11.8

## 2015-11-17 NOTE — Progress Notes (Signed)
Date:  11/17/2015   Name:  Jenny Cooper   DOB:  1936-08-15   MRN:  [id number]   Chief Complaint: New Evaluation New patient here to establish with new PCP.  Previously seen by [name] but they did not agree on several issues.  Thyroid Problem Presents for follow-up visit. Patient reports no diaphoresis, fatigue or palpitations. The symptoms have been stable. Past treatments include levothyroxine. The treatment provided significant relief.   Pneumonitis - Pulmonary MD is not sure the cause - most likely radiation pneumonitis. She had a severe cough with sputum with blood.  She was started on prednisone at high dose with plans for a slow taper.  She is doing much better and will be on prednisone for several more months.  There is concern for elevated blood sugars - last random was 165 last month.  She admits to eating more sweets.  CAD - patient is followed by Cardiology.  She was seen today and doing well.  Medications are stable. She is s/p PTCA and CABG in 2002.  Atrial Fibrillation - chronic - diagnosed about 6 months ago when she was hospitalized with respiratory issues.  She has been rate controlled with anticoagulation with Eliquis.  She denies bleeding.  Change in Bowel habits - over the past several months she has noticed a decrease in caliber of stool.  There has been no blood or mucus.  No rectal pain or swelling. She had a colonoscopy about 5 years ago - she recalls a few benign polyps but is unsure what type.  She is anxious and feels that she needs to have another colonoscopy.  Lung cancer - Diagnosed Jan 2016 with Squamous Cell CA of the right lung. She underwent XRT and Chemotherapy; completed chemotherapy in July, 2016. Since then developed pulmonary symptoms, possible new left lower lung mass and shortness of breath.  She was prescribed several courses of steroid and antibiotics with minimal improvement.  Finally settled on a prolonged course of steroids with a  slow taper and is improving.  Recent CT chest showed resolution of the left lower lung mass.   Upper Extremity Neuropathy - this has been present for several years.  Work up determined cause to be a pinched nerve in her neck and shoulder.  No surgery recommended.  She is dealing with the symptoms.  Review of Systems  Constitutional: Negative for fever, chills, diaphoresis, fatigue and unexpected weight change.  Eyes: Negative for visual disturbance.  Respiratory: Positive for cough, chest tightness, shortness of breath and wheezing.   Cardiovascular: Negative for chest pain, palpitations and leg swelling.  Gastrointestinal: Negative for abdominal pain, blood in stool and anal bleeding.       Change in bowel habits - decreased stool caliber  Endocrine: Positive for polyphagia (since being on prednisone - craving sweets).  Genitourinary: Negative for dysuria and hematuria.  Musculoskeletal: Negative for joint swelling and gait problem.  Skin: Negative for rash and wound.  Neurological: Positive for numbness. Negative for dizziness and headaches.  Psychiatric/Behavioral: Negative for dysphoric mood.    Patient Active Problem List   Diagnosis Date Noted  . Hyperglycemia 11/17/2015  . Radiation pneumonitis ([place]) 10/21/2015  . Mass of lower lobe of left lung 10/21/2015  . COPD exacerbation ([place]) 10/06/2015  . Chronic systolic CHF (congestive heart failure) ([place])   . PAF (paroxysmal atrial fibrillation) ([place])   . Mitral regurgitation   . HLD (hyperlipidemia)   . Paroxysmal supraventricular tachycardia ([place])   .  Acute respiratory failure with hypoxia and hypercapnia (HCC)   . Coronary artery disease involving coronary bypass graft of native heart without angina pectoris   . NSTEMI (non-ST elevated myocardial infarction) ([place])   . Arteriosclerosis of coronary artery 01/11/2015  . Hypothyroidism (acquired) 01/11/2015  . Carcinoma of right lung ([place]) 01/03/2015  . Disorder of peripheral  nervous system ([place]) 10/04/2014  . 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 Cervical radiculopathy, chronic 10/04/2014  . Squamous cell carcinoma of lung ([place]) 10/04/2014  . Cough 09/09/2014  . Dyspnea 09/09/2014  . CAFL (chronic airflow limitation) ([place]) 04/24/2012  . Chronic obstructive pulmonary disease ([place]) 04/24/2012  . Myocardial infarction ([place]) 04/24/2012  . Hypertension 08/03/2011  . Artificial cardiac pacemaker 08/03/2011    Prior to Admission medications   Medication Sig Start Date End Date Taking? Authorizing Provider  apixaban (ELIQUIS) 5 MG TABS tablet Take 5 mg by mouth 2 (two) times daily.   Yes Historical Provider, MD  atorvastatin (LIPITOR) 20 MG tablet Take 20 mg by mouth daily at 6 PM.    Yes Historical Provider, MD  Cetirizine HCl 10 MG CAPS Take 1 capsule (10 mg total) by mouth 1 day or 1 dose. 05/19/15  Yes Ryan M Dunn, PA-C  ENTRESTO 24-26 MG TAKE 1 TABLET BY MOUTH TWICE A DAY 11/03/15  Yes Minna Merritts, MD  ipratropium-albuterol (DUONEB) 0.5-2.5 (3) MG/3ML SOLN TAKE 3ML BY NEBULIZER EVERY 6 HOURS AS NEEDED 04/07/15  Yes Historical Provider, MD  levothyroxine (SYNTHROID, LEVOTHROID) 75 MCG tablet Take 75 mcg by mouth daily before breakfast.   Yes Historical Provider, MD  omeprazole (PRILOSEC) 20 MG capsule Take 1 capsule (20 mg total) by mouth every morning. 11/03/15  Yes Forest Gleason, MD  predniSONE (DELTASONE) 20 MG tablet Take '40mg'$  for 3 wk then starting on 11/11/15 start taking '30mg'$  until follow up 10/21/15 10/20/16 Yes Vishal Mungal, MD  metoprolol succinate (TOPROL-XL) 25 MG 24 hr tablet Take 25 mg by mouth 2 (two) times daily. Reported on 11/17/2015    Historical Provider, MD    Allergies  Allergen Reactions  . Lovenox [Enoxaparin Sodium] Itching  . Meperidine Other (See Comments)    Other Reaction: pt does not like how it makes her feel    Past Surgical History  Procedure Laterality Date  . Appendectomy    . Vaginal hysterectomy      partial -  left ovary remains  . Coronary artery bypass graft  09/2000  . Pacemaker insertion  03/2012  . Cardiac catheterization N/A 04/28/2015    Procedure: Left Heart Cath and Coronary Angiography;  Surgeon: Wellington Hampshire, MD;  Location: [facility];  Service: Cardiovascular;  Laterality: N/A;  . Endobronchial ultrasound N/A 10/06/2015    Procedure: ENDOBRONCHIAL ULTRASOUND;  Surgeon: Flora Lipps, MD;  Location: [facility];  Service: Cardiopulmonary;  Laterality: N/A;  . Electromagnetic navigation brochoscopy N/A 10/06/2015    Procedure: ELECTROMAGNETIC NAVIGATION BRONCHOSCOPY;  Surgeon: Flora Lipps, MD;  Location: [facility];  Service: Cardiopulmonary;  Laterality: N/A;  . Colonoscopy  12/2009    2 small tubular adenomas    Social History  Substance Use Topics  . Smoking status: Former Smoker -- 2.00 packs/day for 42 years    Types: Cigarettes    Quit date: 08/30/2000  . Smokeless tobacco: None     Comment: quit smoking in 08/28/2000  . Alcohol Use: 6.0 oz/week    10 Glasses of wine per week     Comment: 2 glasses  of wine per day     Medication list has been reviewed and updated.   Physical Exam  Constitutional: She is oriented to person, place, and time. She appears well-developed. No distress.  HENT:  Head: Normocephalic and atraumatic.  Neck: No thyromegaly present.  Cardiovascular: Normal rate, normal heart sounds and normal pulses.  An irregularly irregular rhythm present.  Pulmonary/Chest: Effort normal. No respiratory distress. She has decreased breath sounds. She has wheezes in the right lower field. She has no rhonchi.  Abdominal: Soft. Normal appearance and bowel sounds are normal. There is no hepatosplenomegaly. There is no tenderness.  Genitourinary: Rectum normal. Rectal exam shows no external hemorrhoid, no fissure, no mass and no tenderness. Right adnexum displays no mass, no tenderness and no fullness. Left adnexum displays no mass, no tenderness and no fullness.    Musculoskeletal: Normal range of motion.  Lymphadenopathy:    She has no cervical adenopathy.  Neurological: She is alert and oriented to person, place, and time. She has normal reflexes.  Skin: Skin is warm and dry. No rash noted.  Psychiatric: She has a normal mood and affect. Her speech is normal and behavior is normal. Thought content normal.  Nursing note and vitals reviewed.   BP 110/64 mmHg  Pulse 85  Ht '5\' 7"'$  (1.702 m)  Wt 160 lb (72.576 kg)  BMI 25.05 kg/m2  SpO2 96%  Assessment and Plan: 1. Hyperglycemia Likely due to prolonged prednisone use Will check labs and advise Reduce intake of sweets and carbs - Hemoglobin Y1E - Basic metabolic panel  2. PAF (paroxysmal atrial fibrillation) (HCC) On Eliquis and beta blocker with good rate control  3. Essential hypertension controlled  4. Other emphysema ([place]) Continue duo-nebs and Pulmonary follow up  5. Hypothyroidism (acquired)  supplemented  6. Carcinoma of right lung ([place]) Now with likely radiation pneumonitis Continue slow prednisone taper per Pulmonary Continue omeprazole as long as on prednisone then stop  7. Peripheral nerve disease ([place]) Stable; no treatment currently  8. Coronary artery disease involving coronary bypass graft of native heart without angina pectoris Stable disease  9. Chronic systolic CHF (congestive heart failure) (HCC) Stable disease at present  10. Hx of adenomatous colonic polyps 2 small polyps in 2011 - tubular adenomas Normal rectal exam and pelvic today Recommended 5 yr follow up, however I do not recommend coming off Eliquis at this time - would like to defer for another 6 months.  Halina Maidens, MD [facility]  11/17/2015

## 2015-11-17 NOTE — Patient Instructions (Addendum)
You are doing well. No medication changes were made.  Research price of xarelto (one a day), pradaxa (twice a day)  Walk as much as tolerated  Please call us if you have new issues that need to be addressed before your next appt.  Your physician wants you to follow-up in: 3 months.  You will receive a reminder letter in the mail two months in advance. If you don't receive a letter, please call our office to schedule the follow-up appointment.

## 2015-11-17 NOTE — Progress Notes (Signed)
Patient ID: Jenny Cooper, female    DOB: [birth year], 80 y.o.   MRN: [id number]  HPI Comments: 80 y.o. female with h/o CAD s/p remote history of MI in 2002 s/p 2 vessel CABG post stenting in 2002, history of mitral valve repair in 2002 secondary to mitral regurgitation, history of PAF s/p prior TEE/DCCV on Eliquis, atypical atrial flutter s/p ablation on 07/27/2013 s/p MDT PPM, carcinoma of right lung, COPD, HTN, and HLD who presented to [facility] on 04/01/15 with increased SOB and was found to have PNA, possibly post obstructive and elevated troponin. She presents today to the clinic for follow-up of her atrial fibrillation  In follow-up today, she reports that her breathing is slowly improving on prednisone taper She has been on 40 mg of prednisone for at least 2-3 weeks, now down to 30 mg daily Her cough has improved Unclear to what extent the atrial fibrillation is affecting her breathing Much less sputum production She is being managed by primary care and Dr. Stevenson Clinch Denies any leg edema, no abdominal swelling, no chest pain symptoms  CT scan September 2016 showing improvement of her lung cancer She finished her cancer treatment over the summer 2016, she had chemotherapy and radiation  EKG on today's visit shows atrial fibrillation, paced complexes, rate 78 bpm, details discussed with her Declined EKG On her last clinic visit, exam consistent with atrial fibrillation EKG on her last clinic visit showed atrial fibrillation September 2016 EKG August 2016 showed normal sinus rhythm  Other past medical history TEE/DCCV in 2013 for her PAF. In 2014 she was diagnosed with atypical atrial flutter and underwent ablation. underwent PPM generator change in 2013.    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   finished concurrent chemoradiation with carboplatinum and Taxol and PET scan showed significant response.    present to [facility] on 8/2. She complained of  increase SOB, cough that was productive of green to yellow sputum, nausea, and vomiting.   required BiPAP for a short time upon her arrival. CXR showed bibasilar airspace disease right greater than left has progressed significantly from the prior study, worrisome for recurrent carcinoma however pneumonia could also have this appearance. CT chest has been ordered.   Troponin was found to be 0.48-->1.83.   Echo showed EF 30-35%, severe anterior and infero/posterior wall HK. Left ventricular function parameters were normal, mild to moderate MR. Left atrium was mildly dilated. RV systolic function was normal. Mild to moderate TR. PASP was moderately to severely elevated at 60 mm Hg.    outpatient cardiac cath on 04/28/2015 that showed 3 vessel CAD with patent SVG to LAD, occluded SVG to OM, native RCA had severe ISR in the mid-segment and was occluded distally at the sites of previously placed stents. There were left to right collaterals. Moderately reduced LVSF with EF of 35-40%. Mildly elevated LVEDP. There were no good targets for revascularization. medical therapy.   cardiac event monitor to evaluate her Afib burden that showed 50% Afib burden with a peak heart rate of 116, mostly rate controlled. Given this finding her Toprol was titrated up on 9/6 to 50 mg bid.      Allergies  Allergen Reactions  . Lovenox [Enoxaparin Sodium] Itching  . Meperidine Other (See Comments)    Other Reaction: pt does not like how it makes her feel    Current Outpatient Prescriptions on File Prior to Visit  Medication Sig Dispense Refill  .  apixaban (ELIQUIS) 5 MG TABS tablet Take 5 mg by mouth 2 (two) times daily.    Marland Kitchen atorvastatin (LIPITOR) 20 MG tablet Take 20 mg by mouth daily at 6 PM.     . Cetirizine HCl 10 MG CAPS Take 1 capsule (10 mg total) by mouth 1 day or 1 dose. 30 capsule 5  . ENTRESTO 24-26 MG TAKE 1 TABLET BY MOUTH TWICE A DAY 60 tablet 5  . ipratropium-albuterol (DUONEB) 0.5-2.5 (3) MG/3ML SOLN  TAKE 3ML BY NEBULIZER EVERY 6 HOURS AS NEEDED  2  . levothyroxine (SYNTHROID, LEVOTHROID) 75 MCG tablet Take 75 mcg by mouth daily before breakfast.    . omeprazole (PRILOSEC) 20 MG capsule Take 1 capsule (20 mg total) by mouth every morning. 30 capsule 6  . predniSONE (DELTASONE) 20 MG tablet Take '40mg'$  for 3 wk then starting on 11/11/15 start taking '30mg'$  until follow up 82 tablet 0   Current Facility-Administered Medications on File Prior to Visit  Medication Dose Route Frequency Provider Last Rate Last Dose  . sodium chloride 0.9 % injection 10 mL  10 mL Intravenous PRN Forest Gleason, MD   10 mL at 02/19/15 1000    Past Medical History  Diagnosis Date  . HTN (hypertension)   . Pacemaker     a. MDT 2002; b. generator replacement 2013; c. followed by Dr. Omelia Blackwater, MD  . HLD (hyperlipidemia)   . CAD (coronary artery disease)     a. s/p MI x 2 in 2002 s/p PCI x 2 in 2002; b. s/p 2v CABG 2002; c. stress echo 07/2004 w/ evi of pos & inf infarct & no evi of ischemia; d. 4/08 dipyridamole scan w/ multiple areas of infarct, no ischemia, EF 49%; e. cath 04/28/15 3v CAD, med Rx rec, no targets for revasc, LM lum irregs, pLAD 30%, 100%, ost-pLCx 60%, [name] 99%, OM2 100%, p-mRCA 90%, [name] 100% L-R collats, VG-mLAD irregs, VG-OM2 oc  . Lung cancer ([place])   . History of colonoscopy 2013  . History of mammography, screening 2015  . History of Papanicolaou smear of cervix 2013  . Carcinoma of right lung ([place]) 01/03/2015    a. followed by Dr. Oliva Bustard  . Mitral regurgitation     a. s/p mitral ring placement 09/2000; b. echo 09/2010: EF 50%, inf HK, post AK, mild MR, prosthetic mitral valve ring w/ peak gradient of 10 mmHg; b. echo 2/13: EF 50%, mild MR/TR     . History of blood clots     12/2001  . Atypical atrial flutter ([place])     a. s/p ablation 07/27/2013  . PAF (paroxysmal atrial fibrillation) ([facility])     a. on Eliquis   . COPD (chronic obstructive pulmonary disease) ([place])   . Chronic systolic CHF  (congestive heart failure) ([place])     a. echo 03/2015: EF 30-35%, sev ant/inf/pos HK, in mild to mod MR  . Myocardial infarction ([facility])     X 2 (LAST ONE IN 200)  . Hypothyroidism   . GERD (gastroesophageal reflux disease)   . Neuropathy [facility])     Past Surgical History  Procedure Laterality Date  . Appendectomy    . Vaginal hysterectomy    . Coronary artery bypass graft  09/2000  . Pacemaker insertion  03/2012  . Cardiac catheterization N/A 04/28/2015    Procedure: Left Heart Cath and Coronary Angiography;  Surgeon: Wellington Hampshire, MD;  Location: [place] [facility];  Service: Cardiovascular;  Laterality: N/A;  . Endobronchial  ultrasound N/A 10/06/2015    Procedure: ENDOBRONCHIAL ULTRASOUND;  Surgeon: Flora Lipps, MD;  Location: [facility] OR[facility];  Service: Cardiopulmonary;  Laterality: N/A;  . Electromagnetic navigation brochoscopy N/A 10/06/2015    Procedure: ELECTROMAGNETIC NAVIGATION BRONCHOSCOPY;  Surgeon: Flora Lipps, MD;  Location: [facility] ORS;  Service: Cardiopulmonary;  Laterality: N/A;    Social History  reports that she quit smoking about 15 years ago. Her smoking use included Cigarettes. She has a 84 pack-year smoking history. She does not have any smokeless tobacco history on file. She reports that she drinks about 6.0 oz of alcohol per week. She reports that she does not use illicit drugs.  Family History family history includes COPD in her mother; Hypertension in her brother and sister; Stroke in her maternal grandmother. There is no history of Heart attack.  Review of Systems  Constitutional: Negative.   Respiratory: Positive for shortness of breath.   Cardiovascular: Negative.   Gastrointestinal: Negative.   Musculoskeletal: Negative.   Neurological: Negative.   Hematological: Negative.   Psychiatric/Behavioral: Negative.   All other systems reviewed and are negative.  BP 120/80 mmHg  Pulse 79  Ht '5\' 7"'$  (1.702 m)  Wt 158 lb (71.668 kg)  BMI 24.74 kg/m2  Physical Exam   Constitutional: She is oriented to person, place, and time. She appears well-developed and well-nourished.  HENT:  Head: Normocephalic.  Nose: Nose normal.  Mouth/Throat: Oropharynx is clear and moist.  Eyes: Conjunctivae are normal. Pupils are equal, round, and reactive to light.  Neck: Normal range of motion. Neck supple. No JVD present.  Cardiovascular: Normal rate, regular rhythm, normal heart sounds and intact distal pulses.  Exam reveals no gallop and no friction rub.   No murmur heard. Pulmonary/Chest: Effort normal. No respiratory distress. She has decreased breath sounds. She has no wheezes. She has rales. She exhibits no tenderness.  Abdominal: Soft. Bowel sounds are normal. She exhibits no distension. There is no tenderness.  Musculoskeletal: Normal range of motion. She exhibits no edema or tenderness.  Lymphadenopathy:    She has no cervical adenopathy.  Neurological: She is alert and oriented to person, place, and time. Coordination normal.  Skin: Skin is warm and dry. No rash noted. No erythema.  Psychiatric: She has a normal mood and affect. Her behavior is normal. Judgment and thought content normal.

## 2015-11-17 NOTE — Assessment & Plan Note (Signed)
Persistent atrial fibrillation, She does not want to try to restore normal sinus rhythm at this time as she is improving Breathing dramatically better on prednisone Recommended she call us if she feels otherwise, Would probably need to meet with Dr. Caryl Comes to discuss antiarrhythmics at that time

## 2015-11-17 NOTE — Assessment & Plan Note (Signed)
Cholesterol is at goal on the current lipid regimen. No changes to the medications were made.    Total encounter time more than 25 minutes  Greater than 50% was spent in counseling and coordination of care with the patient  

## 2015-11-17 NOTE — Assessment & Plan Note (Signed)
Blood pressure is well controlled on today's visit. No changes made to the medications. 

## 2015-11-17 NOTE — Assessment & Plan Note (Signed)
Currently with no symptoms of angina. No further workup at this time. Continue current medication regimen. 

## 2015-11-17 NOTE — Assessment & Plan Note (Signed)
Denies any angina, no further workup at this time

## 2015-11-17 NOTE — Assessment & Plan Note (Signed)
Reports that Lasix was discontinued. Review of lab work shows borderline elevated BUN and creatinine She appears relatively euvolemic on today's visit Previous echocardiogram with moderately elevated right heart pressures, though in the setting of severe underlying lung disease. We'll monitor symptoms for now, we'll continue current doses of her medications. In the future could increase the dose of entresto

## 2015-11-18 ENCOUNTER — Telehealth: Payer: Self-pay

## 2015-11-18 LAB — HEMOGLOBIN A1C
ESTIMATED AVERAGE GLUCOSE: 143 mg/dL
Hgb A1c MFr Bld: 6.6 % — ABNORMAL HIGH (ref 4.8–5.6)

## 2015-11-18 LAB — BASIC METABOLIC PANEL
BUN / CREAT RATIO: 34 — AB (ref 11–26)
BUN: 30 mg/dL — AB (ref 8–27)
CALCIUM: 9.4 mg/dL (ref 8.7–10.3)
CHLORIDE: 96 mmol/L (ref 96–106)
CO2: 27 mmol/L (ref 18–29)
Creatinine, Ser: 0.87 mg/dL (ref 0.57–1.00)
GFR calc Af Amer: 74 mL/min/{1.73_m2} (ref >59)
GFR calc non Af Amer: 64 mL/min/{1.73_m2} (ref >59)
GLUCOSE: 120 mg/dL — AB (ref 65–99)
Potassium: 5.2 mmol/L (ref 3.5–5.2)
Sodium: 139 mmol/L (ref 134–144)

## 2015-11-18 NOTE — Telephone Encounter (Signed)
Left message for patient to call back  

## 2015-11-18 NOTE — Telephone Encounter (Signed)
-----   Message from Glean Hess, MD sent at 11/18/2015  8:02 AM EDT ----- Labs show that blood sugar is in the early diabetic range.  For now, cut back on all sweets and carbohydrates.  I do not think you need medication at this time. Please schedule a follow up appointment with me in 6 weeks to recheck. I also received the colonoscopy report from 2011 - they removed 2 small adenomatous polyps - the type that can become cancerous.  You need another colonoscopy but it should be delayed until later this year.

## 2015-11-20 NOTE — Telephone Encounter (Signed)
Left message for patient to call back  

## 2015-11-24 ENCOUNTER — Telehealth: Payer: Self-pay | Admitting: *Deleted

## 2015-11-24 ENCOUNTER — Ambulatory Visit
Admission: RE | Admit: 2015-11-24 | Discharge: 2015-11-24 | Disposition: A | Discharge status: Home / Self Care | Payer: Medicare Other | Source: Ambulatory Visit | Attending: Internal Medicine | Admitting: Internal Medicine

## 2015-11-24 DIAGNOSIS — I2510 Atherosclerotic heart disease of native coronary artery without angina pectoris: Secondary | ICD-10-CM | POA: Insufficient documentation

## 2015-11-24 DIAGNOSIS — R0602 Shortness of breath: Secondary | ICD-10-CM | POA: Insufficient documentation

## 2015-11-24 DIAGNOSIS — J701 Chronic and other pulmonary manifestations due to radiation: Secondary | ICD-10-CM | POA: Insufficient documentation

## 2015-11-24 DIAGNOSIS — Z85118 Personal history of other malignant neoplasm of bronchus and lung: Secondary | ICD-10-CM | POA: Diagnosis present

## 2015-11-24 DIAGNOSIS — R918 Other nonspecific abnormal finding of lung field: Secondary | ICD-10-CM | POA: Insufficient documentation

## 2015-11-24 DIAGNOSIS — I318 Other specified diseases of pericardium: Secondary | ICD-10-CM | POA: Diagnosis not present

## 2015-11-24 DIAGNOSIS — Z951 Presence of aortocoronary bypass graft: Secondary | ICD-10-CM | POA: Insufficient documentation

## 2015-11-24 DIAGNOSIS — J700 Acute pulmonary manifestations due to radiation: Secondary | ICD-10-CM

## 2015-11-24 DIAGNOSIS — I252 Old myocardial infarction: Secondary | ICD-10-CM | POA: Insufficient documentation

## 2015-11-24 MED ORDER — IOPAMIDOL (ISOVUE-300) INJECTION 61%
75.0000 mL | Freq: Once | INTRAVENOUS | Status: AC | PRN
  Administered 2015-11-24: 75 mL via INTRAVENOUS

## 2015-11-24 NOTE — Telephone Encounter (Signed)
-----   Message from Vilinda Boehringer, MD sent at 11/24/2015 11:56 AM EDT ----- Please inform patient that I have reviewed her CT Scan, there is some improvement compared to the last CT, further details to be discussed at follow up visit.   Thank you

## 2015-11-24 NOTE — Telephone Encounter (Signed)
LM on VM for pt to return call in regards to results.

## 2015-11-24 NOTE — Telephone Encounter (Signed)
Pt informed. Nothing further needed. 

## 2015-11-26 NOTE — Telephone Encounter (Signed)
Spoke with patient. Patient advised of all results and verbalized understanding. Will call back with any future questions or concerns. Patient made return visit for 6 weeks. [facility]

## 2015-11-29 ENCOUNTER — Other Ambulatory Visit: Payer: Self-pay | Admitting: Physician Assistant

## 2015-12-01 ENCOUNTER — Encounter: Payer: Self-pay | Admitting: Internal Medicine

## 2015-12-01 ENCOUNTER — Ambulatory Visit (INDEPENDENT_AMBULATORY_CARE_PROVIDER_SITE_OTHER): Payer: Medicare Other | Admitting: Internal Medicine

## 2015-12-01 VITALS — BP 124/72 | HR 96 | Ht 67.5 in | Wt 160.2 lb

## 2015-12-01 DIAGNOSIS — R05 Cough: Secondary | ICD-10-CM | POA: Diagnosis not present

## 2015-12-01 DIAGNOSIS — I25810 Atherosclerosis of coronary artery bypass graft(s) without angina pectoris: Secondary | ICD-10-CM

## 2015-12-01 DIAGNOSIS — J700 Acute pulmonary manifestations due to radiation: Secondary | ICD-10-CM | POA: Diagnosis not present

## 2015-12-01 DIAGNOSIS — J984 Other disorders of lung: Secondary | ICD-10-CM

## 2015-12-01 DIAGNOSIS — R059 Cough, unspecified: Secondary | ICD-10-CM

## 2015-12-01 DIAGNOSIS — R918 Other nonspecific abnormal finding of lung field: Secondary | ICD-10-CM

## 2015-12-01 MED ORDER — AMOXICILLIN-POT CLAVULANATE 500-125 MG PO TABS: 1.0000 | ORAL_TABLET | Freq: Two times a day (BID) | ORAL | Status: DC

## 2015-12-01 MED ORDER — PREDNISONE 10 MG (21) PO TBPK: ORAL_TABLET | ORAL | Status: DC

## 2015-12-01 NOTE — Patient Instructions (Addendum)
Follow up with Dr. Stevenson Clinch in: 6 weeks - Prednisone '20mg'$  for 3 weeks starting tomorrow, then on 12/23/15, start prednisone '15mg'$  daily with breakfast until follow up visit with Dr. Stevenson Clinch - cont with Prilosec daily - '20mg'$  - 1 tab daily prior to breakfast, take with full glass of water - Augmentin tablet (500/125) - 1 tab BID x 14 days - [place] nebulizer - 1 treatment every 8 hrs as need for wheezing/sob/coughing (cyclical) - please discuss with your PMD about neurology follow up/evaluation for right sided neuropathy/numbness - 2 view CXR prior to follow up visit - incentive spirometry 10-15 times per day, daily.

## 2015-12-01 NOTE — Assessment & Plan Note (Addendum)
CT images reviewed, there are right upper lobe changes, that has a temporal relationship to recent radiation which have now stayed the same, but with architectural distortion in the left lung Her last radiation dose was August 2016, since then has required 3 subsequent prednisone tapers. Current CT chest reviewed with patient. 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  Left with nodular and mass-like areas of distortion, which is improved from Feb 2016 CT chest scan.  New basilar GGO areas in the left lung. Will continue with supportive care. Patient has had 3 CT scan in the last 2 months, with gradual improvement, therefore will follow up with CXRs, and perform another CT chest scan in 3 months if symptoms persist.    Plan: -Prednisone 20 mg daily for the next 3 weeks, then on 12/23/15 prednisone '15mg'$  until follow-up visit - repeat CXR prior to follw up visit - Augmentin tablet (500/125) - 1 tab BID x 14 days

## 2015-12-01 NOTE — Progress Notes (Signed)
Date: 12/01/2015  MRN# [id number] Jenny Cooper 15-Apr-1936  PMD - Dr. Gayland Curry Jenny Cooper is a 80 y.o. old female seen in follow up for new LLL mass.   CC:  Chief Complaint  Patient presents with  . Follow-up    pt. states breathing is baseline. c/o SOB, dry cough, wheezing, chest tightness X2-3wk. currently takin '30mg'$  prednisone.   Synopsis - 80 year old female first evaluated by pulmonary in early 2016 for chronic cough, found to have right hilar mass, even this of mass and pathology specimens positive for squamous cell, stage IIIa. Now status post chemoradiation, recently found to have left lower lobe mass, with chronic cough, chronic antibiotics. Biopsy, EBUS, ENB, of left lower lobe mass negative for malignancy  Events since last clinic visit: Patient presents today for follow-up visit of right hilar mass, chronic cough, new left lower lobe mass. At her last visit she was started on a steroid taper (slow), currently on prednisone '30mg'$  daily, taking PPI every other day.Took augmentin rx, 1 tab TID x 4 weeks, this was not the correct rx.  Today with mild cough, sob,mild wheezing. NO chills, no fevers, no sick contacts.  CT chest reviewed with patient in office today.  Has complaints of neuropathy - right hand and foot numbness, along with "tightness"  PMHX:   Past Medical History  Diagnosis Date  . HTN (hypertension)   . Pacemaker     a. MDT 2002; b. generator replacement 2013; c. followed by Dr. Omelia Blackwater, MD  . HLD (hyperlipidemia)   . CAD (coronary artery disease)     a. s/p MI x 2 in 2002 s/p PCI x 2 in 2002; b. s/p 2v CABG 2002; c. stress echo 07/2004 w/ evi of pos & inf infarct & no evi of ischemia; d. 4/08 dipyridamole scan w/ multiple areas of infarct, no ischemia, EF 49%; e. cath 04/28/15 3v CAD, med Rx rec, no targets for revasc, LM lum irregs, pLAD 30%, 100%, ost-pLCx 60%, [name] 99%, OM2 100%, p-mRCA 90%, [name] 100% L-R collats, VG-mLAD irregs, VG-OM2 oc   . Lung cancer ([place])   . History of colonoscopy 2013  . History of mammography, screening 2015  . History of Papanicolaou smear of cervix 2013  . Carcinoma of right lung ([place]) 01/03/2015    a. followed by Dr. Oliva Bustard  . Mitral regurgitation     a. s/p mitral ring placement 09/2000; b. echo 09/2010: EF 50%, inf HK, post AK, mild MR, prosthetic mitral valve ring w/ peak gradient of 10 mmHg; b. echo 2/13: EF 50%, mild MR/TR     . History of blood clots     12/2001  . Atypical atrial flutter ([place])     a. s/p ablation 07/27/2013 followed by Dr. Rockey Situ  . PAF (paroxysmal atrial fibrillation) ([facility])     a. on Eliquis   . COPD (chronic obstructive pulmonary disease) ([place])   . Chronic systolic CHF (congestive heart failure) ([place])     a. echo 03/2015: EF 30-35%, sev ant/inf/pos HK, in mild to mod MR  . Myocardial infarction ([place])     X 2 (LAST ONE IN 2002)  . Hypothyroidism   . GERD (gastroesophageal reflux disease)   . Neuropathy [facility])    Surgical Hx:  Past Surgical History  Procedure Laterality Date  . Appendectomy    . Vaginal hysterectomy      partial - left ovary remains  . Coronary artery bypass graft  09/2000  . Pacemaker insertion  03/2012  .  Cardiac catheterization N/A 04/28/2015    Procedure: Left Heart Cath and Coronary Angiography;  Surgeon: Wellington Hampshire, MD;  Location: [facility];  Service: Cardiovascular;  Laterality: N/A;  . Endobronchial ultrasound N/A 10/06/2015    Procedure: ENDOBRONCHIAL ULTRASOUND;  Surgeon: Flora Lipps, MD;  Location: [facility];  Service: Cardiopulmonary;  Laterality: N/A;  . Electromagnetic navigation brochoscopy N/A 10/06/2015    Procedure: ELECTROMAGNETIC NAVIGATION BRONCHOSCOPY;  Surgeon: Flora Lipps, MD;  Location: [facility];  Service: Cardiopulmonary;  Laterality: N/A;  . Colonoscopy  12/2009    2 small tubular adenomas   Family Hx:  Family History  Problem Relation Age of Onset  . COPD Mother     sister, and brother  . Heart attack Neg Hx    . Stroke Maternal Grandmother   . Hypertension Sister   . Hypertension Brother   . Colon cancer Brother     age 80   Social Hx:   Social History  Substance Use Topics  . Smoking status: Former Smoker -- 2.00 packs/day for 42 years    Types: Cigarettes    Quit date: 08/30/2000  . Smokeless tobacco: None     Comment: quit smoking in 08/28/2000  . Alcohol Use: 6.0 oz/week    10 Glasses of wine per week     Comment: 2 glasses of wine per day   Medication:   Current Outpatient Rx  Name  Route  Sig  Dispense  Refill  . apixaban (ELIQUIS) 5 MG TABS tablet   Oral   Take 5 mg by mouth 2 (two) times daily.         Marland Kitchen atorvastatin (LIPITOR) 20 MG tablet   Oral   Take 20 mg by mouth daily at 6 PM.          . Cetirizine HCl 10 MG CAPS   Oral   Take 1 capsule (10 mg total) by mouth 1 day or 1 dose.   30 capsule   5   . ENTRESTO 24-26 MG      TAKE 1 TABLET BY MOUTH TWICE A DAY   60 tablet   5   . ipratropium-albuterol (DUONEB) 0.5-2.5 (3) MG/3ML SOLN      TAKE 3ML BY NEBULIZER EVERY 6 HOURS AS NEEDED      2   . levothyroxine (SYNTHROID, LEVOTHROID) 75 MCG tablet   Oral   Take 75 mcg by mouth daily before breakfast.         . metoprolol succinate (TOPROL-XL) 25 MG 24 hr tablet      TAKE 1 TABLET BY MOUTH EVERY MORNING AND 2 TABS IN THE EVENING   90 tablet   3   . omeprazole (PRILOSEC) 20 MG capsule   Oral   Take 1 capsule (20 mg total) by mouth every morning.   30 capsule   6   . predniSONE (DELTASONE) 20 MG tablet      Take '40mg'$  for 3 wk then starting on 11/11/15 start taking '30mg'$  until follow up   82 tablet   0   . amoxicillin-clavulanate (AUGMENTIN) 500-125 MG tablet   Oral   Take 1 tablet (500 mg total) by mouth 2 (two) times daily.   28 tablet   0   . predniSONE (STERAPRED UNI-PAK 21 TAB) 10 MG (21) TBPK tablet      Take 2 tabs ('20mg'$ ) for 3 weeks, then  On 12/23/15 1.5tab ('15mg'$ ) until f/u   80 tablet   0  Allergies:  Lovenox and  Meperidine  Review of Systems: Gen:  Denies  fever, sweats, chills HEENT: Denies blurred vision, double vision, ear pain, eye pain, hearing loss, nose bleeds, sore throat Cvc:  No dizziness, chest pain or heaviness Resp:  Sob, mild wheezing, cough - mainly non productive.  Gi: Denies swallowing difficulty, stomach pain, nausea or vomiting, diarrhea, constipation, bowel incontinence Gu:  Denies bladder incontinence, burning urine Ext:   No Joint pain, stiffness or swelling Skin: No skin rash, easy bruising or bleeding or hives Endoc:  No polyuria, polydipsia , polyphagia or weight change Psych: No depression, insomnia or hallucinations  Other:  Right hand and foot numbness  Physical Examination:   VS: BP 124/72 mmHg  Pulse 96  Ht 5' 7.5" (1.715 m)  Wt 160 lb 3.2 oz (72.666 kg)  BMI 24.71 kg/m2  SpO2 92%  General Appearance: No distress  Neuro:without focal findings, mental status, speech normal, alert and oriented, cranial nerves 2-12 intact, reflexes normal and symmetric, sensation grossly normal - right/left hand - sensation intact, strength 5/5 HEENT: PERRLA, EOM intact, no ptosis, no other lesions noticed; Mallampati 2 Pulmonary: coarse upper airway sounds, dec BS at the left bases, good airway movement in the RUL, diaphragmatic excursion normal.No wheezing,    Sputum Production:  none CardiovascularNormal S1,S2.  No m/r/g.  Abdominal aorta pulsation normal.    Abdomen: Benign, Soft, non-tender, No masses, hepatosplenomegaly, No lymphadenopathy Renal:  No costovertebral tenderness  GU:  No performed at this time. Endoc: No evident thyromegaly, no signs of acromegaly or Cushing features Skin:   warm, no rashes, no ecchymosis  Extremities: normal, no cyanosis, clubbing, no edema, warm with normal capillary refill. Other findings:none   Rad results: (The following images and results were reviewed by Dr. Stevenson Clinch on 12/01/2015). CT Chest 11/24/15 IMPRESSION: 1. Posttreatment changes  of paramediastinal radiation fibrosis in the perihilar aspect of the right lung appear very similar to the prior examination. There continues to be multifocal nodular areas of architectural distortion and ground-glass attenuation throughout the lungs bilaterally. Findings in the left lung generally appear less pronounced than the prior study, where as there are increasing areas of ground-glass attenuation in the right lung. These remain nonspecific, and warrant continued attention on future followup studies. Given that the primary lesion was a squamous cell carcinoma, these ground-glass attenuation areas are more likely post treatment related, or of infectious or inflammatory etiology, rather than evidence of metastasis (this appearance would be more typical for metastasis from a primary adenocarcinoma than a squamous cell carcinoma). 2. Atherosclerosis, including 3 vessel coronary artery disease, with evidence of scarring from prior left circumflex coronary artery territory myocardial infarction. Status post median sternotomy for CABG. 3. Small amount of pericardial thickening and calcification again noted. No pericardial effusion. 4. Additional incidental findings, as above   ECHO 03/2014 MILD LV DYSFUNCTION  NORMAL RIGHT VENTRICULAR SYSTOLIC FUNCTION VALVULAR REGURGITATION: MILD MR, TRIVIAL PR, MILD TR PROSTHETIC VALVE(S): PROSTHETIC MV RING    Assessment and Plan: 80 year old female past medical history of chronic cough, COPD, stage IIIa non-small cell lung cancer, status post chemoradiation, seen in follow-up visit for recurrent cough, suspected postradiation pneumonitis/fibrosis, new left lower lobe mass    Radiation pneumonitis ([facility]) CT images reviewed, there are right upper lobe changes, that has a temporal relationship to recent radiation which have now stayed the same, but with architectural distortion in the left lung Her last radiation dose was August 2016, since then  has required 3 subsequent prednisone  tapers. Current CT chest reviewed with patient. 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  Left with nodular and mass-like areas of distortion, which is improved from Feb 2016 CT chest scan.  New basilar GGO areas in the left lung. Will continue with supportive care. Patient has had 3 CT scan in the last 2 months, with gradual improvement, therefore will follow up with CXRs, and perform another CT chest scan in 3 months if symptoms persist.    Plan: -Prednisone 20 mg daily for the next 3 weeks, then on 12/23/15 prednisone '15mg'$  until follow-up visit - repeat CXR prior to follw up visit - Augmentin tablet (500/125) - 1 tab BID x 14 days     Cough Multifactorial: Postradiation pneumonitis, fibrosis, architectural distortion, anatomical distortion of the right mainstem, right hilum inflammation, left lower lobe mass, COPD, Patient will likely have chronic cough, and some level of chronic dyspnea level the persistent post radiation fibrosis in the right lung.    Plan: -Hold current inhalers which include Spiriva, and Advair -Treat radiation pneumonitis/fibrosis with prednisone -Treat left lower lobe mass as pneumonia with Augmentin     Mass of lower lobe of left lung left lower lobe measuring 3.3 x 4.7 cm  CT scan chest 10/06/2015 - now resolved on recent CT Chest scan (11/24/15), there are new areas of GGO on the left base, which I will continue to treat as an inflammatory process with antibiotics and steroids.   Plan: -Given negative pathology from recent workup from bronchoscopy, we'll treat as pneumonia,complete out another 2 weeks of augmentin -We'll treat as postobstructive, severe aspiration, pneumonia - we'll give Augmentin 500 mg, 1 tab daily for another 2 weeks and then repeat CXR prior to follow up. Given 3 CT scan in the last 2 months, will follow up with CXR, and consider repeat Chest in 2-3 months if needed.  Patient in agreement with this plan.         Updated Medication List Outpatient Encounter Prescriptions as of 12/01/2015  Medication Sig  . apixaban (ELIQUIS) 5 MG TABS tablet Take 5 mg by mouth 2 (two) times daily.  Marland Kitchen atorvastatin (LIPITOR) 20 MG tablet Take 20 mg by mouth daily at 6 PM.   . Cetirizine HCl 10 MG CAPS Take 1 capsule (10 mg total) by mouth 1 day or 1 dose.  Marland Kitchen ENTRESTO 24-26 MG TAKE 1 TABLET BY MOUTH TWICE A DAY  . ipratropium-albuterol (DUONEB) 0.5-2.5 (3) MG/3ML SOLN TAKE 3ML BY NEBULIZER EVERY 6 HOURS AS NEEDED  . levothyroxine (SYNTHROID, LEVOTHROID) 75 MCG tablet Take 75 mcg by mouth daily before breakfast.  . metoprolol succinate (TOPROL-XL) 25 MG 24 hr tablet TAKE 1 TABLET BY MOUTH EVERY MORNING AND 2 TABS IN THE EVENING  . omeprazole (PRILOSEC) 20 MG capsule Take 1 capsule (20 mg total) by mouth every morning.  . predniSONE (DELTASONE) 20 MG tablet Take '40mg'$  for 3 wk then starting on 11/11/15 start taking '30mg'$  until follow up  . amoxicillin-clavulanate (AUGMENTIN) 500-125 MG tablet Take 1 tablet (500 mg total) by mouth 2 (two) times daily.  . predniSONE (STERAPRED UNI-PAK 21 TAB) 10 MG (21) TBPK tablet Take 2 tabs ('20mg'$ ) for 3 weeks, then  On 12/23/15 1.5tab ('15mg'$ ) until f/u  . [DISCONTINUED] metoprolol succinate (TOPROL-XL) 25 MG 24 hr tablet Take 25 mg by mouth 2 (two) times daily. Reported on 12/01/2015   Facility-Administered Encounter Medications as of 12/01/2015  Medication  . sodium chloride 0.9 % injection 10 mL  Orders for this visit: Orders Placed This Encounter  Procedures  . DG Chest 2 View    Standing Status: Future     Number of Occurrences:      Standing Expiration Date: 01/30/2017    Order Specific Question:  Reason for Exam (SYMPTOM  OR DIAGNOSIS REQUIRED)    Answer:  radiation PNA    Order Specific Question:  Preferred imaging location?    Answer:  [facility]     Thank  you for the consultation and for allowing [facility] to assist in the care of your patient. Our recommendations are noted above.  Please contact us if we can be of further service.   Vilinda Boehringer, MD [name] Office Number: [phone]

## 2015-12-01 NOTE — Assessment & Plan Note (Signed)
left lower lobe measuring 3.3 x 4.7 cm  CT scan chest 10/06/2015 - now resolved on recent CT Chest scan (11/24/15), there are new areas of GGO on the left base, which I will continue to treat as an inflammatory process with antibiotics and steroids.   Plan: -Given negative pathology from recent workup from bronchoscopy, we'll treat as pneumonia,complete out another 2 weeks of augmentin -We'll treat as postobstructive, severe aspiration, pneumonia - we'll give Augmentin 500 mg, 1 tab daily for another 2 weeks and then repeat CXR prior to follow up. Given 3 CT scan in the last 2 months, will follow up with CXR, and consider repeat Chest in 2-3 months if needed. Patient in agreement with this plan.

## 2015-12-01 NOTE — Assessment & Plan Note (Signed)
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    Plan: -Hold current inhalers which include Spiriva, and Advair -Treat radiation pneumonitis/fibrosis with prednisone -Treat left lower lobe mass as pneumonia with Augmentin

## 2015-12-14 ENCOUNTER — Other Ambulatory Visit: Payer: Self-pay | Admitting: Physician Assistant

## 2015-12-17 ENCOUNTER — Encounter: Payer: Self-pay | Admitting: Internal Medicine

## 2015-12-23 ENCOUNTER — Inpatient Hospital Stay: Payer: Medicare Other | Attending: Oncology | Admitting: Family Medicine

## 2015-12-23 ENCOUNTER — Inpatient Hospital Stay: Payer: Medicare Other

## 2015-12-23 VITALS — BP 124/80 | HR 86 | Temp 97.0°F | Wt 160.8 lb

## 2015-12-23 DIAGNOSIS — I480 Paroxysmal atrial fibrillation: Secondary | ICD-10-CM | POA: Insufficient documentation

## 2015-12-23 DIAGNOSIS — Z87891 Personal history of nicotine dependence: Secondary | ICD-10-CM | POA: Diagnosis not present

## 2015-12-23 DIAGNOSIS — Z8701 Personal history of pneumonia (recurrent): Secondary | ICD-10-CM | POA: Insufficient documentation

## 2015-12-23 DIAGNOSIS — E785 Hyperlipidemia, unspecified: Secondary | ICD-10-CM | POA: Diagnosis not present

## 2015-12-23 DIAGNOSIS — Z7901 Long term (current) use of anticoagulants: Secondary | ICD-10-CM | POA: Insufficient documentation

## 2015-12-23 DIAGNOSIS — R062 Wheezing: Secondary | ICD-10-CM | POA: Diagnosis not present

## 2015-12-23 DIAGNOSIS — E039 Hypothyroidism, unspecified: Secondary | ICD-10-CM | POA: Diagnosis not present

## 2015-12-23 DIAGNOSIS — Z452 Encounter for adjustment and management of vascular access device: Secondary | ICD-10-CM | POA: Diagnosis not present

## 2015-12-23 DIAGNOSIS — R5383 Other fatigue: Secondary | ICD-10-CM | POA: Insufficient documentation

## 2015-12-23 DIAGNOSIS — I252 Old myocardial infarction: Secondary | ICD-10-CM | POA: Insufficient documentation

## 2015-12-23 DIAGNOSIS — C3401 Malignant neoplasm of right main bronchus: Secondary | ICD-10-CM | POA: Diagnosis not present

## 2015-12-23 DIAGNOSIS — Z9221 Personal history of antineoplastic chemotherapy: Secondary | ICD-10-CM | POA: Insufficient documentation

## 2015-12-23 DIAGNOSIS — Z950 Presence of cardiac pacemaker: Secondary | ICD-10-CM | POA: Diagnosis not present

## 2015-12-23 DIAGNOSIS — I5022 Chronic systolic (congestive) heart failure: Secondary | ICD-10-CM | POA: Insufficient documentation

## 2015-12-23 DIAGNOSIS — Z7952 Long term (current) use of systemic steroids: Secondary | ICD-10-CM

## 2015-12-23 DIAGNOSIS — C3491 Malignant neoplasm of unspecified part of right bronchus or lung: Secondary | ICD-10-CM

## 2015-12-23 DIAGNOSIS — C771 Secondary and unspecified malignant neoplasm of intrathoracic lymph nodes: Secondary | ICD-10-CM | POA: Insufficient documentation

## 2015-12-23 DIAGNOSIS — I2510 Atherosclerotic heart disease of native coronary artery without angina pectoris: Secondary | ICD-10-CM | POA: Diagnosis not present

## 2015-12-23 DIAGNOSIS — I10 Essential (primary) hypertension: Secondary | ICD-10-CM | POA: Diagnosis not present

## 2015-12-23 DIAGNOSIS — Z95828 Presence of other vascular implants and grafts: Secondary | ICD-10-CM

## 2015-12-23 DIAGNOSIS — Z79899 Other long term (current) drug therapy: Secondary | ICD-10-CM | POA: Diagnosis not present

## 2015-12-23 DIAGNOSIS — R05 Cough: Secondary | ICD-10-CM

## 2015-12-23 DIAGNOSIS — J449 Chronic obstructive pulmonary disease, unspecified: Secondary | ICD-10-CM | POA: Insufficient documentation

## 2015-12-23 DIAGNOSIS — K219 Gastro-esophageal reflux disease without esophagitis: Secondary | ICD-10-CM | POA: Insufficient documentation

## 2015-12-23 DIAGNOSIS — I340 Nonrheumatic mitral (valve) insufficiency: Secondary | ICD-10-CM | POA: Diagnosis not present

## 2015-12-23 DIAGNOSIS — G629 Polyneuropathy, unspecified: Secondary | ICD-10-CM | POA: Insufficient documentation

## 2015-12-23 DIAGNOSIS — Z923 Personal history of irradiation: Secondary | ICD-10-CM

## 2015-12-23 LAB — CBC WITH DIFFERENTIAL/PLATELET
BASOS PCT: 0 %
Basophils Absolute: 0 10*3/uL (ref 0–0.1)
EOS ABS: 0 10*3/uL (ref 0–0.7)
EOS PCT: 0 %
HCT: 42.3 % (ref 35.0–47.0)
Hemoglobin: 13.9 g/dL (ref 12.0–16.0)
LYMPHS ABS: 0.8 10*3/uL — AB (ref 1.0–3.6)
Lymphocytes Relative: 11 %
MCH: 28.5 pg (ref 26.0–34.0)
MCHC: 32.9 g/dL (ref 32.0–36.0)
MCV: 86.6 fL (ref 80.0–100.0)
Monocytes Absolute: 0.6 10*3/uL (ref 0.2–0.9)
Monocytes Relative: 9 %
NEUTROS PCT: 80 %
Neutro Abs: 5.6 10*3/uL (ref 1.4–6.5)
PLATELETS: 211 10*3/uL (ref 150–440)
RBC: 4.88 MIL/uL (ref 3.80–5.20)
RDW: 20.1 % — ABNORMAL HIGH (ref 11.5–14.5)
WBC: 6.9 10*3/uL (ref 3.6–11.0)

## 2015-12-23 LAB — COMPREHENSIVE METABOLIC PANEL
ALBUMIN: 3.9 g/dL (ref 3.5–5.0)
ALT: 32 U/L (ref 14–54)
ANION GAP: 8 (ref 5–15)
AST: 26 U/L (ref 15–41)
Alkaline Phosphatase: 88 U/L (ref 38–126)
BUN: 22 mg/dL — ABNORMAL HIGH (ref 6–20)
CHLORIDE: 102 mmol/L (ref 101–111)
CO2: 29 mmol/L (ref 22–32)
CREATININE: 0.9 mg/dL (ref 0.44–1.00)
Calcium: 9.6 mg/dL (ref 8.9–10.3)
GFR calc non Af Amer: >60 mL/min (ref >60)
GLUCOSE: 154 mg/dL — AB (ref 65–99)
Potassium: 5.5 mmol/L — ABNORMAL HIGH (ref 3.5–5.1)
SODIUM: 139 mmol/L (ref 135–145)
Total Bilirubin: 0.7 mg/dL (ref 0.3–1.2)
Total Protein: 7.5 g/dL (ref 6.5–8.1)

## 2015-12-23 MED ORDER — HEPARIN SOD (PORK) LOCK FLUSH 100 UNIT/ML IV SOLN
500.0000 [IU] | Freq: Once | INTRAVENOUS | Status: AC
  Administered 2015-12-23: 500 [IU] via INTRAVENOUS

## 2015-12-23 MED ORDER — SODIUM CHLORIDE 0.9% FLUSH
10.0000 mL | INTRAVENOUS | Status: DC | PRN
  Administered 2015-12-23: 10 mL via INTRAVENOUS
  Filled 2015-12-23: qty 10

## 2015-12-23 NOTE — Progress Notes (Signed)
[facility]  [phone]  [phone]     Jenny Cooper DOB: 03/07/1936  MR#: [id number]  IEP#:[id number]  Patient Care Team: Glean Hess, MD as PCP - General (Family Medicine) Minna Merritts, MD as Consulting Physician (Cardiology) Vilinda Boehringer, MD as Consulting Physician (Pulmonary Disease)  CHIEF COMPLAINT:  Chief Complaint  Patient presents with  . carcinoma of right lung    INTERVAL HISTORY:   Patient is here for further follow-up regarding pneumonia and carcinoma of lung. Patient is closely followed by Dr. Stevenson Clinch and he has started tapering patient's prednisone dosing. She is currently on 15 mg of prednisone daily and is due for her next tapering dose in approximately 3-3-1/2 weeks. She was last seen by Dr. Oliva Bustard on  11/15/2015. CT scan at that time showed significant improvement. Patient also reports dramatic improvement in symptoms. She continues with some fatigue, cough, slight wheezing but overall feels 95% better.  REVIEW OF SYSTEMS:   Review of Systems  Constitutional: Negative for fever, chills, weight loss, malaise/fatigue and diaphoresis.  HENT: Positive for congestion.   Eyes: Negative.   Respiratory: Positive for cough and wheezing. Negative for hemoptysis, sputum production and shortness of breath.   Cardiovascular: Negative for chest pain, palpitations, orthopnea, claudication, leg swelling and PND.  Gastrointestinal: Negative for heartburn, nausea, vomiting, abdominal pain, diarrhea, constipation, blood in stool and melena.  Genitourinary: Negative.   Musculoskeletal: Negative.   Skin: Negative.   Neurological: Negative for dizziness, tingling, focal weakness, seizures and weakness.  Endo/Heme/Allergies: Does not bruise/bleed easily.  Psychiatric/Behavioral: Negative for depression. The patient is not nervous/anxious and does not have insomnia.     As per HPI. Otherwise, a complete review of systems is  negatve.  ONCOLOGY HISTORY: Oncology History   50. 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 Biopsy from hilar area and lymph node station 4R was positive for squamous cell carcinoma.  Patient had compression of the right mainstem bronchus because of enlarged lymph node and a mass. Stage is T4 N1 M0 tumor stage IIIa diagnosis in January of 2016 2. Started on radiation and chemotherapy from October 21, 2014 3. Finished 6 cycles of carboplatinum and Taxol  in March  29 th of 2016,  PET scan shows significant response  4. Started on consolidation chemotherapy.  Patient finished 2 cycles on July 2016 of carboplatin and Taxol. 5. Atrial fibrillation diagnosis in August of 2016 on eloquis 6. Repeat bronchoscopy was negative for any malignancy  In February 2017.     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 HTN (hypertension)   . Pacemaker     a. MDT 2002; b. generator replacement 2013; c. followed by Dr. Omelia Blackwater, MD  . HLD (hyperlipidemia)   . CAD (coronary artery disease)     a. s/p MI x 2 in 2002 s/p PCI x 2 in 2002; b. s/p 2v CABG 2002; c. stress echo 07/2004 w/ evi of pos & inf infarct & no evi of ischemia; d. 4/08 dipyridamole scan w/ multiple areas of infarct, no ischemia, EF 49%; e. cath 04/28/15 3v CAD, med Rx rec, no targets for revasc, LM lum irregs, pLAD 30%, 100%, ost-pLCx 60%, [name] 99%, OM2 100%, p-mRCA 90%, [name] 100% L-R collats, VG-mLAD irregs, VG-OM2 oc  . Lung cancer (  [place])   . History of colonoscopy 2013  . History of mammography, screening 2015  . History of Papanicolaou smear of cervix 2013  . Carcinoma of right lung ([place]) 01/03/2015    a. followed by Dr. Oliva Bustard  . Mitral regurgitation     a. s/p mitral ring placement 09/2000; b. echo 09/2010: EF 50%, inf HK, post AK, mild MR,  prosthetic mitral valve ring w/ peak gradient of 10 mmHg; b. echo 2/13: EF 50%, mild MR/TR     . History of blood clots     12/2001  . Atypical atrial flutter ([place])     a. s/p ablation 07/27/2013 followed by Dr. Rockey Situ  . PAF (paroxysmal atrial fibrillation) ([facility])     a. on Eliquis   . COPD (chronic obstructive pulmonary disease) ([place])   . Chronic systolic CHF (congestive heart failure) ([place])     a. echo 03/2015: EF 30-35%, sev ant/inf/pos HK, in mild to mod MR  . Myocardial infarction ([employer] [place] [employer])     X 2 (LAST ONE IN 2002)  . Hypothyroidism   . GERD (gastroesophageal reflux disease)   . Neuropathy ([place])     PAST SURGICAL HISTORY: Past Surgical History  Procedure Laterality Date  . Appendectomy    . Vaginal hysterectomy      partial - left ovary remains  . Coronary artery bypass graft  09/2000  . Pacemaker insertion  03/2012  . Cardiac catheterization N/A 04/28/2015    Procedure: Left Heart Cath and Coronary Angiography;  Surgeon: Wellington Hampshire, MD;  Location: [facility];  Service: Cardiovascular;  Laterality: N/A;  . Endobronchial ultrasound N/A 10/06/2015    Procedure: ENDOBRONCHIAL ULTRASOUND;  Surgeon: Flora Lipps, MD;  Location: [facility];  Service: Cardiopulmonary;  Laterality: N/A;  . Electromagnetic navigation brochoscopy N/A 10/06/2015    Procedure: ELECTROMAGNETIC NAVIGATION BRONCHOSCOPY;  Surgeon: Flora Lipps, MD;  Location: [facility];  Service: Cardiopulmonary;  Laterality: N/A;  . Colonoscopy  12/2009    2 small tubular adenomas    FAMILY HISTORY Family History  Problem Relation Age of Onset  . COPD Mother     sister, and brother  . Heart attack Neg Hx   . Stroke Maternal Grandmother   . Hypertension Sister   . Hypertension Brother   . Colon cancer Brother     age 65    GYNECOLOGIC HISTORY:  No LMP recorded. Patient has had a hysterectomy.     ADVANCED DIRECTIVES:    HEALTH MAINTENANCE: Social History  Substance Use Topics  . Smoking status: Former  Smoker -- 2.00 packs/day for 42 years    Types: Cigarettes    Quit date: 08/30/2000  . Smokeless tobacco: Not on file     Comment: quit smoking in 08/28/2000  . Alcohol Use: 6.0 oz/week    10 Glasses of wine per week     Comment: 2 glasses of wine per day     Allergies  Allergen Reactions  . Lovenox [Enoxaparin Sodium] Itching  . Meperidine Other (See Comments)    Other Reaction: pt does not like how it makes her feel    Current Outpatient Prescriptions  Medication Sig Dispense Refill  . apixaban (ELIQUIS) 5 MG TABS tablet Take 5 mg by mouth 2 (two) times daily.    Marland Kitchen atorvastatin (LIPITOR) 20 MG tablet Take 20 mg by mouth daily at 6 PM.     . Cetirizine HCl 10 MG CAPS Take 1 capsule (10 mg total) by mouth 1 day or  1 dose. 30 capsule 5  . ENTRESTO 24-26 MG TAKE 1 TABLET BY MOUTH TWICE A DAY 60 tablet 5  . ipratropium-albuterol (DUONEB) 0.5-2.5 (3) MG/3ML SOLN TAKE 3ML BY NEBULIZER EVERY 6 HOURS AS NEEDED  2  . levothyroxine (SYNTHROID, LEVOTHROID) 75 MCG tablet Take 75 mcg by mouth daily before breakfast.    . metoprolol succinate (TOPROL-XL) 25 MG 24 hr tablet TAKE 1 TABLET BY MOUTH EVERY MORNING AND 2 TABS IN THE EVENING 90 tablet 3  . omeprazole (PRILOSEC) 20 MG capsule Take 1 capsule (20 mg total) by mouth every morning. 30 capsule 6  . predniSONE (DELTASONE) 20 MG tablet Take 54m for 3 wk then starting on 11/11/15 start taking 337muntil follow up 82 tablet 0  . predniSONE (STERAPRED UNI-PAK 21 TAB) 10 MG (21) TBPK tablet Take 2 tabs (2050mfor 3 weeks, then  On 12/23/15 1.5tab (63m28mntil f/u 80 tablet 0   No current facility-administered medications for this visit.   Facility-Administered Medications Ordered in Other Visits  Medication Dose Route Frequency Provider Last Rate Last Dose  . sodium chloride 0.9 % injection 10 mL  10 mL Intravenous PRN JanaForest Gleason   10 mL at 02/19/15 1000    OBJECTIVE: BP 124/80 mmHg  Pulse 86  Temp(Src) 97 F (36.1 C) (Tympanic)  Wt  160 lb 13.2 oz (72.95 kg)   Body mass index is 24.8 kg/(m^2).    ECOG FS:1 - Symptomatic but completely ambulatory  General: Well-developed, well-nourished, no acute distress. Eyes: Pink conjunctiva, anicteric sclera. HEENT: Normocephalic, moist mucous membranes, clear oropharnyx. Lungs:  Right upper lobe, right lower lobe, and left upper lobe inspiratory wheezing. Heart: Regular rate and rhythm. No rubs, murmurs, or gallops. Musculoskeletal: No edema, cyanosis, or clubbing. Neuro: Alert, answering all questions appropriately. Cranial nerves grossly intact. Skin: No rashes or petechiae noted. Psych: Normal affect. Lymphatics: No cervical, clavicular LAD.   LAB RESULTS:  Appointment on 12/23/2015  Component Date Value Ref Range Status  . WBC 12/23/2015 6.9  3.6 - 11.0 K/uL Final  . RBC 12/23/2015 4.88  3.80 - 5.20 MIL/uL Final  . Hemoglobin 12/23/2015 13.9  12.0 - 16.0 g/dL Final  . HCT 12/23/2015 42.3  35.0 - 47.0 % Final  . MCV 12/23/2015 86.6  80.0 - 100.0 fL Final  . MCH 12/23/2015 28.5  26.0 - 34.0 pg Final  . MCHC 12/23/2015 32.9  32.0 - 36.0 g/dL Final  . RDW 12/23/2015 20.1* 11.5 - 14.5 % Final  . Platelets 12/23/2015 211  150 - 440 K/uL Final  . Neutrophils Relative % 12/23/2015 80   Final  . Neutro Abs 12/23/2015 5.6  1.4 - 6.5 K/uL Final  . Lymphocytes Relative 12/23/2015 11   Final  . Lymphs Abs 12/23/2015 0.8* 1.0 - 3.6 K/uL Final  . Monocytes Relative 12/23/2015 9   Final  . Monocytes Absolute 12/23/2015 0.6  0.2 - 0.9 K/uL Final  . Eosinophils Relative 12/23/2015 0   Final  . Eosinophils Absolute 12/23/2015 0.0  0 - 0.7 K/uL Final  . Basophils Relative 12/23/2015 0   Final  . Basophils Absolute 12/23/2015 0.0  0 - 0.1 K/uL Final  . Sodium 12/23/2015 139  135 - 145 mmol/L Final  . Potassium 12/23/2015 5.5* 3.5 - 5.1 mmol/L Final  . Chloride 12/23/2015 102  101 - 111 mmol/L Final  . CO2 12/23/2015 29  22 - 32 mmol/L Final  . Glucose, Bld 12/23/2015 154* 65 - 99  mg/dL Final  .  BUN 12/23/2015 22* 6 - 20 mg/dL Final  . Creatinine, Ser 12/23/2015 0.90  0.44 - 1.00 mg/dL Final  . Calcium 12/23/2015 9.6  8.9 - 10.3 mg/dL Final  . Total Protein 12/23/2015 7.5  6.5 - 8.1 g/dL Final  . Albumin 12/23/2015 3.9  3.5 - 5.0 g/dL Final  . AST 12/23/2015 26  15 - 41 U/L Final  . ALT 12/23/2015 32  14 - 54 U/L Final  . Alkaline Phosphatase 12/23/2015 88  38 - 126 U/L Final  . Total Bilirubin 12/23/2015 0.7  0.3 - 1.2 mg/dL Final  . GFR calc non Af Amer 12/23/2015 >60  >60 mL/min Final  . GFR calc Af Amer 12/23/2015 >60  >60 mL/min Final   Comment: (NOTE) The eGFR has been calculated using the CKD EPI equation. This calculation has not been validated in all clinical situations. eGFR's persistently <60 mL/min signify possible Chronic Kidney Disease.   . Anion gap 12/23/2015 8  5 - 15 Final    STUDIES: No results found.  ASSESSMENT:   Non-small cell carcinoma of the lung, stage III.  PLAN:    1. Non-small cell carcinoma of lung. Patient continues to slowly taper off of prednisone with pulmonologist. Overall patient reports feeling greatly improved. She is due to see Dr. Stevenson Clinch  again in 3-3-1/2 weeks.  Overall she continues to improve  Symptomatically and most recent CT scan also shows significant improvement. Patient will return to our clinic in approximately 1 month for further evaluation.  We'll also go ahead and schedule port flushes as outpatient.  Patient expressed understanding and was in agreement with this plan. She also understands that She can call clinic at any time with any questions, concerns, or complaints.   Dr. Oliva Bustard was available for consultation and review of plan of care for this patient.  Carcinoma of right lung ([place])   Staging form: Lung, AJCC 7th Edition     Clinical: Stage IIIA (T4, N1, M0) - Unsigned   Evlyn Kanner, NP   12/23/2015 4:32 PM

## 2015-12-23 NOTE — Progress Notes (Signed)
Patient here for follow up for lung CA. Patient has had pnuemonia this winter and has persistant cough.

## 2016-01-05 ENCOUNTER — Ambulatory Visit (INDEPENDENT_AMBULATORY_CARE_PROVIDER_SITE_OTHER): Payer: Medicare Other | Admitting: Internal Medicine

## 2016-01-05 ENCOUNTER — Encounter: Payer: Self-pay | Admitting: Internal Medicine

## 2016-01-05 VITALS — BP 107/78 | HR 84 | Temp 98.0°F | Resp 16 | Ht 67.0 in | Wt 160.8 lb

## 2016-01-05 DIAGNOSIS — J700 Acute pulmonary manifestations due to radiation: Secondary | ICD-10-CM

## 2016-01-05 DIAGNOSIS — I10 Essential (primary) hypertension: Secondary | ICD-10-CM

## 2016-01-05 DIAGNOSIS — I25810 Atherosclerosis of coronary artery bypass graft(s) without angina pectoris: Secondary | ICD-10-CM | POA: Diagnosis not present

## 2016-01-05 DIAGNOSIS — E119 Type 2 diabetes mellitus without complications: Secondary | ICD-10-CM

## 2016-01-05 DIAGNOSIS — G64 Other disorders of peripheral nervous system: Secondary | ICD-10-CM | POA: Diagnosis not present

## 2016-01-05 NOTE — Patient Instructions (Signed)
Begin a B Complex multi-vitamin daily  Wrist splints - cock-up or carpal tunnel splints - wear while sleeping at night.

## 2016-01-05 NOTE — Progress Notes (Signed)
Date:  01/05/2016   Name:  Jenny Cooper   DOB:  [birth year]   MRN:  [id number]   Chief Complaint: Blood Sugar Problem Diabetes She presents for her initial diabetic visit. She has type 2 diabetes mellitus. Pertinent negatives for hypoglycemia include no dizziness, headaches, seizures or tremors. Pertinent negatives for diabetes include no chest pain, no fatigue, no polyphagia and no weakness (but difficulty with fine motor skills). Current diabetic treatment includes diet. Her weight is stable.  Blood sugar spiked most likely from on-going prednisone therapy for radilation induced pneumonitis.  Last visit dietary changes only were recommended as initial therapy. She is down to 15 mg per day of prednisone.  She now eating less sweets and her weight is stable.  Lab Results  Component Value Date   HGBA1C 6.6* 11/17/2015   Neuropathy - She complains of numbness and tingling in both arms and in the lateral left leg. On the right the numbness begins in her shoulder against on the outside of her arm to her hand and fingers. On the right arm and numbness starts at her elbow and goes down to her hand. 2014 she had a CT myelogram which showed some degenerative disc disease at the C-spine, mild bone spurring but no disc bulge to explain her symptoms. 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 She denies pain and is not interested in pain medication. She does have some difficulty with fine motor skills and difficult task like removing jar lids but she has learned to compensate. Her pulmonologist had suggested that she see a neurologist and so she is here to discuss that with me today. She is not particularly interested in further testing since it is not painful. She does have a history of carpal tunnel release on the right and may have one at home.  CT cervical spine post myelogram with contrast protocol4/09/2012  [place]  Result Narrative    Post-myelogram CT of the cervical spine, November 29, 2012.  Comparison: None.  Technique: Thin section axial CT images were obtained through the cervical spine from skull base through T2 following the administration of intrathecal contrast material (see separate procedural dictation). These images were reformatted in the coronal and sagittal planes to aid in diagnosis. This examination was interpreted using multiplanar reconstructions.  This rendering was performed because of the indication(s) for the examinations.  Indication(s) include bony anatomy. If multiplanar reconstructions had not been performed, the likelihood of detecting an abnormality relevant to the patient's condition would have been substantially decreased.  Findings: Normal alignment and lordosis of the cervical spine with no evidence of listhesis. No static subluxations noted. Appropriate prevertebral body heights with vertebral disc height loss at multiple levels, most notable at C5-C6 and C6-C7.  Multilevel degenerative disc disease throughout the cervical spine most notable along the lower cervical spine with small anterior posterior osteophytes. No fractures are seen.  C1-C2: No disc bulge. Neuroforamina widely patent, bilaterally. No facet arthropathy. No spinal canal stenosis.  C2-C3: No disc bulge. Neuroforamina widely patent, bilaterally. Mild bilateral facet arthropathy. No spinal canal stenosis.  C3-C4: No disc bulge.  Severe bilateral facet arthropathy with severe left neuroforaminal narrowing. No spinal canal stenosis.  C4-C5: No disc bulge. Mild bilateral facet arthropathy causing mild neuroforaminal narrowing bilaterally. No spinal canal stenosis.  C5-C6:  Small posterior disc osteophyte complex that abuts the spinal canal with no canal stenosis. Neuroforamina widely patent, bilaterally. Mild bilateral facet arthropathy.  C6-C7:  Small posterior disc osteophyte complex that abuts the  spinal canal with no canal stenosis. Neuroforamina widely patent, bilaterally. Mild to moderate bilateral facet arthropathy.  C7-T1:  Small posterior disc osteophyte complex that abuts the spinal canal with no canal stenosis.  Neuroforamina widely patent, bilaterally. Mild bilateral facet arthropathy.  T1-T2:  No disc bulge.  Neuroforamina widely patent, bilaterally.  No spinal canal stenosis.  Evaluation of the regional soft tissues and lung apices are unremarkable. Partial visualization of ICD.  Skull base is unremarkable.   Impression: Mild multilevel degenerative disease of the cervical spine with no canal stenosis; Mild to moderate scattered bilateral facet arthropathy most severe at C3-C4 with severe left neuroforaminal narrowing.   I have reviewed the images and concur with the above findings.      Review of Systems  Constitutional: Negative for fatigue and unexpected weight change.  Eyes: Negative for visual disturbance.  Respiratory: Positive for cough, chest tightness, shortness of breath and wheezing.   Cardiovascular: Negative for chest pain, palpitations and leg swelling.  Gastrointestinal: Negative for abdominal pain, blood in stool and anal bleeding.       Change in bowel habits - decreased stool caliber  Endocrine: Negative for polyphagia.  Genitourinary: Negative for dysuria and hematuria.  Musculoskeletal: Positive for neck stiffness. Negative for joint swelling, gait problem and neck pain.  Skin: Negative for rash and wound.  Neurological: Positive for numbness. Negative for dizziness, tremors, seizures, weakness (but difficulty with fine motor skills) and headaches.  Psychiatric/Behavioral: Negative for dysphoric mood.    Patient Active Problem List   Diagnosis Date Noted  . DM type 2, goal A1c below 7 11/17/2015  . Hx of adenomatous colonic polyps 11/17/2015  . Radiation pneumonitis ([place]) 10/21/2015  . Mass of lower lobe of left lung 10/21/2015  .  COPD exacerbation ([place]) 10/06/2015  . Chronic systolic CHF (congestive heart failure) ([place])   . PAF (paroxysmal atrial fibrillation) ([place])   . Mitral regurgitation   . HLD (hyperlipidemia)   . Paroxysmal supraventricular tachycardia ([place])   . Acute respiratory failure with hypoxia and hypercapnia ([facility])   . Coronary artery disease involving coronary bypass graft of native heart without angina pectoris   . NSTEMI (non-ST elevated myocardial infarction) ([place])   . Arteriosclerosis of coronary artery 01/11/2015  . Hypothyroidism (acquired) 01/11/2015  . Carcinoma of right lung ([place]) 01/03/2015  . Disorder of peripheral nervous system ([place]) 10/04/2014  . 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 Cervical radiculopathy, chronic 10/04/2014  . Squamous cell carcinoma of lung ([place]) 10/04/2014  . Cough 09/09/2014  . Dyspnea 09/09/2014  . CAFL (chronic airflow limitation) ([place]) 04/24/2012  . Chronic obstructive pulmonary disease ([place]) 04/24/2012  . Myocardial infarction ([place]) 04/24/2012  . Hypertension 08/03/2011  . Artificial cardiac pacemaker 08/03/2011    Prior to Admission medications   Medication Sig Start Date End Date Taking? Authorizing Provider  apixaban (ELIQUIS) 5 MG TABS tablet Take 5 mg by mouth 2 (two) times daily.   Yes Historical Provider, MD  atorvastatin (LIPITOR) 20 MG tablet Take 20 mg by mouth daily at 6 PM.    Yes Historical Provider, MD  Cetirizine HCl 10 MG CAPS Take 1 capsule (10 mg total) by mouth 1 day or 1 dose. 05/19/15  Yes Ryan M Dunn, PA-C  ENTRESTO 24-26 MG TAKE 1 TABLET BY MOUTH TWICE A DAY 11/03/15  Yes Minna Merritts, MD  ipratropium-albuterol (DUONEB) 0.5-2.5 (3) MG/3ML SOLN TAKE 3ML BY NEBULIZER EVERY 6 HOURS  AS NEEDED 04/07/15  Yes Historical Provider, MD  levothyroxine (SYNTHROID, LEVOTHROID) 75 MCG tablet Take 75 mcg by mouth daily before breakfast.   Yes Historical Provider, MD  metoprolol succinate (TOPROL-XL) 25 MG 24 hr tablet TAKE 1 TABLET BY  MOUTH EVERY MORNING AND 2 TABS IN THE EVENING 12/01/15  Yes Minna Merritts, MD  omeprazole (PRILOSEC) 20 MG capsule Take 1 capsule (20 mg total) by mouth every morning. 11/03/15  Yes Forest Gleason, MD  prednisoLONE (ORAPRED ODT) 15 MG disintegrating tablet Take 15 mg by mouth daily.   Yes Historical Provider, MD    Allergies  Allergen Reactions  . Lovenox [Enoxaparin Sodium] Itching  . Meperidine Other (See Comments)    Other Reaction: pt does not like how it makes her feel    Past Surgical History  Procedure Laterality Date  . Appendectomy    . Vaginal hysterectomy      partial - left ovary remains  . Coronary artery bypass graft  09/2000  . Pacemaker insertion  03/2012  . Cardiac catheterization N/A 04/28/2015    Procedure: Left Heart Cath and Coronary Angiography;  Surgeon: Wellington Hampshire, MD;  Location: [facility];  Service: Cardiovascular;  Laterality: N/A;  . Endobronchial ultrasound N/A 10/06/2015    Procedure: ENDOBRONCHIAL ULTRASOUND;  Surgeon: Flora Lipps, MD;  Location: [facility];  Service: Cardiopulmonary;  Laterality: N/A;  . Electromagnetic navigation brochoscopy N/A 10/06/2015    Procedure: ELECTROMAGNETIC NAVIGATION BRONCHOSCOPY;  Surgeon: Flora Lipps, MD;  Location: [facility];  Service: Cardiopulmonary;  Laterality: N/A;  . Colonoscopy  12/2009    2 small tubular adenomas    Social History  Substance Use Topics  . Smoking status: Former Smoker -- 2.00 packs/day for 42 years    Types: Cigarettes    Quit date: 08/30/2000  . Smokeless tobacco: None     Comment: quit smoking in 08/28/2000  . Alcohol Use: 6.0 oz/week    10 Glasses of wine per week     Comment: 2 glasses of wine per day    Medication list has been reviewed and updated.  Physical Exam  Constitutional: She appears well-developed and well-nourished.  Neck: Normal range of motion. No thyromegaly present.  Cardiovascular: Normal rate and normal heart sounds.  An irregularly irregular rhythm present.   Pulmonary/Chest: Effort normal. No accessory muscle usage. No respiratory distress. She has no decreased breath sounds. She has wheezes. She has no rales. She exhibits no tenderness.  Musculoskeletal: She exhibits no edema or tenderness.       Cervical back: She exhibits decreased range of motion. She exhibits no tenderness.  Phalen's sign positive bilaterally Grip 5/5  Lymphadenopathy:    She has no cervical adenopathy.  Neurological: She has normal strength.  Reflex Scores:      Bicep reflexes are 0 on the right side and 0 on the left side. Decreased sensation along lateral left arm from shoulder to hand Decreased sensation along lateral right arm from elbow to hand Numbness in hands increases with CTS maneuvers  Psychiatric: She has a normal mood and affect. Her speech is normal.  Nursing note and vitals reviewed.   BP 107/78 mmHg  Pulse 84  Temp(Src) 98 F (36.7 C) (Oral)  Resp 16  Ht '5\' 7"'$  (1.702 m)  Wt 160 lb 12.8 oz (72.938 kg)  BMI 25.18 kg/m2  SpO2 97%  Assessment and Plan: 1. DM type 2, goal A1c below 7 Continue low carb diet; will advise regarding treatment when  labs return - Hemoglobin A1c  2. 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 Radiation pneumonitis ([facility]) Improving with slow prednisone taper  4. Essential hypertension controlled   Halina Maidens, MD [facility]  01/05/2016

## 2016-01-06 LAB — HEMOGLOBIN A1C
ESTIMATED AVERAGE GLUCOSE: 169 mg/dL
Hgb A1c MFr Bld: 7.5 % — ABNORMAL HIGH (ref 4.8–5.6)

## 2016-01-14 ENCOUNTER — Ambulatory Visit (INDEPENDENT_AMBULATORY_CARE_PROVIDER_SITE_OTHER): Payer: Medicare Other | Admitting: Internal Medicine

## 2016-01-14 ENCOUNTER — Encounter: Payer: Self-pay | Admitting: Internal Medicine

## 2016-01-14 VITALS — BP 144/90 | HR 96 | Ht 67.5 in | Wt 161.0 lb

## 2016-01-14 DIAGNOSIS — I25810 Atherosclerosis of coronary artery bypass graft(s) without angina pectoris: Secondary | ICD-10-CM

## 2016-01-14 DIAGNOSIS — J438 Other emphysema: Secondary | ICD-10-CM

## 2016-01-14 DIAGNOSIS — R059 Cough, unspecified: Secondary | ICD-10-CM

## 2016-01-14 DIAGNOSIS — J700 Acute pulmonary manifestations due to radiation: Secondary | ICD-10-CM | POA: Diagnosis not present

## 2016-01-14 DIAGNOSIS — R05 Cough: Secondary | ICD-10-CM | POA: Diagnosis not present

## 2016-01-14 DIAGNOSIS — R918 Other nonspecific abnormal finding of lung field: Secondary | ICD-10-CM

## 2016-01-14 DIAGNOSIS — J984 Other disorders of lung: Secondary | ICD-10-CM | POA: Diagnosis not present

## 2016-01-14 MED ORDER — PREDNISONE 5 MG PO TABS: ORAL_TABLET | ORAL | Status: DC

## 2016-01-14 NOTE — Assessment & Plan Note (Signed)
left lower lobe measuring 3.3 x 4.7 cm  CT scan chest 10/06/2015 - now resolved on recent CT Chest scan (11/24/15), there are new areas of GGO on the left base, which I will continue to treat as an inflammatory process with antibiotics and steroids.   Plan: -Given negative pathology from recent workup from bronchoscopy, will follow up with CXR, she has completed a course of Augmentin since her last visit.  -Given 3 CT scan in the last 6 months, will follow up with CXR, and consider repeat Chest in 2-3 months if needed. Patient in agreement with this plan.

## 2016-01-14 NOTE — Progress Notes (Signed)
Date: 01/14/2016  MRN# [id number] Jenny Cooper [birth year]  PMD - Dr. Gayland Curry Jenny Cooper is a 80 y.o. old female seen in follow up for new LLL mass.   CC:  Chief Complaint  Patient presents with  . Follow-up    pt states breathing has improved since last OV. c/o cont SOB, cont wheezing, occ prod cough clear in color X2.5w   Synopsis - 80 year old female first evaluated by pulmonary in early 2016 for chronic cough, found to have right hilar mass, even this of mass and pathology specimens positive for squamous cell, stage IIIa. Now status post chemoradiation, recently found to have left lower lobe mass, with chronic cough, chronic antibiotics. Biopsy, EBUS, ENB, of left lower lobe mass negative for malignancy  Events since last clinic visit: Patient resents today for follow-up visit of chronic cough, along with postradiation fibrosis. She states since her last visit she's been on 50 mg of prednisone daily, cough had improved up until about 2.5 weeks ago and is now slowly improving again. She describes her cough is worse in the morning, but clear sputum production, and she still taking Prilosec every morning. She denies any fever, chills. She admits to runny nose She says that her cough clears up during the day. No significant worsening of shortness of breath, today she states she is able to walk to the office from parking, which was difficult in the past. Overall with good clinical improvement in her cough and respiratory status.  PMHX:   Past Medical History  Diagnosis Date  . HTN (hypertension)   . Pacemaker     a. MDT 2002; b. generator replacement 2013; c. followed by Dr. Omelia Blackwater, MD  . HLD (hyperlipidemia)   . CAD (coronary artery disease)     a. s/p MI x 2 in 2002 s/p PCI x 2 in 2002; b. s/p 2v CABG 2002; c. stress echo 07/2004 w/ evi of pos & inf infarct & no evi of ischemia; d. 4/08 dipyridamole scan w/ multiple areas of infarct, no ischemia, EF 49%; e.  cath 04/28/15 3v CAD, med Rx rec, no targets for revasc, LM lum irregs, pLAD 30%, 100%, ost-pLCx 60%, [name] 99%, OM2 100%, p-mRCA 90%, [name] 100% L-R collats, VG-mLAD irregs, VG-OM2 oc  . Lung cancer ([place])   . History of colonoscopy 2013  . History of mammography, screening 2015  . History of Papanicolaou smear of cervix 2013  . Carcinoma of right lung ([place]) 01/03/2015    a. followed by Dr. Oliva Bustard  . Mitral regurgitation     a. s/p mitral ring placement 09/2000; b. echo 09/2010: EF 50%, inf HK, post AK, mild MR, prosthetic mitral valve ring w/ peak gradient of 10 mmHg; b. echo 2/13: EF 50%, mild MR/TR     . History of blood clots     12/2001  . Atypical atrial flutter ([place])     a. s/p ablation 07/27/2013 followed by Dr. Rockey Situ  . PAF (paroxysmal atrial fibrillation) ([facility])     a. on Eliquis   . COPD (chronic obstructive pulmonary disease) ([place])   . Chronic systolic CHF (congestive heart failure) ([place])     a. echo 03/2015: EF 30-35%, sev ant/inf/pos HK, in mild to mod MR  . Myocardial infarction ([place])     X 2 (LAST ONE IN 2002)  . Hypothyroidism   . GERD (gastroesophageal reflux disease)   . Neuropathy [facility])    Surgical Hx:  Past Surgical History  Procedure Laterality Date  .  Appendectomy    . Vaginal hysterectomy      partial - left ovary remains  . Coronary artery bypass graft  09/2000  . Pacemaker insertion  03/2012  . Cardiac catheterization N/A 04/28/2015    Procedure: Left Heart Cath and Coronary Angiography;  Surgeon: Wellington Hampshire, MD;  Location: [facility];  Service: Cardiovascular;  Laterality: N/A;  . Endobronchial ultrasound N/A 10/06/2015    Procedure: ENDOBRONCHIAL ULTRASOUND;  Surgeon: Flora Lipps, MD;  Location: [facility];  Service: Cardiopulmonary;  Laterality: N/A;  . Electromagnetic navigation brochoscopy N/A 10/06/2015    Procedure: ELECTROMAGNETIC NAVIGATION BRONCHOSCOPY;  Surgeon: Flora Lipps, MD;  Location: [facility];  Service: Cardiopulmonary;  Laterality: N/A;   . Colonoscopy  12/2009    2 small tubular adenomas   Family Hx:  Family History  Problem Relation Age of Onset  . COPD Mother     sister, and brother  . Heart attack Neg Hx   . Stroke Maternal Grandmother   . Hypertension Sister   . Hypertension Brother   . Colon cancer Brother     age 35   Social Hx:   Social History  Substance Use Topics  . Smoking status: Former Smoker -- 2.00 packs/day for 42 years    Types: Cigarettes    Quit date: 08/30/2000  . Smokeless tobacco: None     Comment: quit smoking in 08/28/2000  . Alcohol Use: 6.0 oz/week    10 Glasses of wine per week     Comment: 2 glasses of wine per day   Medication:   Current Outpatient Rx  Name  Route  Sig  Dispense  Refill  . apixaban (ELIQUIS) 5 MG TABS tablet   Oral   Take 5 mg by mouth 2 (two) times daily.         Marland Kitchen atorvastatin (LIPITOR) 20 MG tablet   Oral   Take 20 mg by mouth daily at 6 PM.          . Cetirizine HCl 10 MG CAPS   Oral   Take 1 capsule (10 mg total) by mouth 1 day or 1 dose.   30 capsule   5   . ENTRESTO 24-26 MG      TAKE 1 TABLET BY MOUTH TWICE A DAY   60 tablet   5   . ipratropium-albuterol (DUONEB) 0.5-2.5 (3) MG/3ML SOLN      TAKE 3ML BY NEBULIZER EVERY 6 HOURS AS NEEDED      2   . levothyroxine (SYNTHROID, LEVOTHROID) 75 MCG tablet   Oral   Take 75 mcg by mouth daily before breakfast.         . metoprolol succinate (TOPROL-XL) 25 MG 24 hr tablet      TAKE 1 TABLET BY MOUTH EVERY MORNING AND 2 TABS IN THE EVENING   90 tablet   3   . omeprazole (PRILOSEC) 20 MG capsule   Oral   Take 1 capsule (20 mg total) by mouth every morning.   30 capsule   6   . prednisoLONE (ORAPRED ODT) 15 MG disintegrating tablet   Oral   Take 15 mg by mouth daily.             Allergies:  Lovenox and Meperidine  Review of Systems: Gen:  Denies  fever, sweats, chills HEENT: Denies blurred vision, double vision, ear pain, eye pain, hearing loss, nose bleeds, sore  throat. Mild runny nose Cvc:  No dizziness, chest pain or  heaviness Resp:  Sob, mild wheezing, cough - mainly non productive.  Gi: Denies swallowing difficulty, stomach pain, nausea or vomiting, diarrhea, constipation, bowel incontinence Gu:  Denies bladder incontinence, burning urine Ext:   No Joint pain, stiffness or swelling Skin: No skin rash, easy bruising or bleeding or hives Endoc:  No polyuria, polydipsia , polyphagia or weight change Psych: No depression, insomnia or hallucinations  Other:  Right hand and foot numbness  Physical Examination:   VS: BP 144/90 mmHg  Pulse 96  Ht 5' 7.5" (1.715 m)  Wt 161 lb (73.029 kg)  BMI 24.83 kg/m2  SpO2 93%  General Appearance: No distress  Neuro:without focal findings, mental status, speech normal, alert and oriented, cranial nerves 2-12 intact, reflexes normal and symmetric, sensation grossly normal - right/left hand - sensation intact, strength 5/5 HEENT: PERRLA, EOM intact, no ptosis, no other lesions noticed; Mallampati 2 Pulmonary: coarse upper airway sounds, dec BS at the left bases, good airway movement in the RUL, diaphragmatic excursion normal.slight R sided exp wheezes,    Sputum Production:  none CardiovascularNormal S1,S2.  No m/r/g.  Abdominal aorta pulsation normal.    Abdomen: Benign, Soft, non-tender, No masses, hepatosplenomegaly, No lymphadenopathy Renal:  No costovertebral tenderness  GU:  No performed at this time. Endoc: No evident thyromegaly, no signs of acromegaly or Cushing features Skin:   warm, no rashes, no ecchymosis  Extremities: normal, no cyanosis, clubbing, no edema, warm with normal capillary refill. Other findings:none   Rad results: (The following images and results were reviewed by Dr. Stevenson Clinch on 01/14/2016). CT Chest 11/24/15 IMPRESSION: 1. Posttreatment changes of paramediastinal radiation fibrosis in the perihilar aspect of the right lung appear very similar to the prior examination. There continues  to be multifocal nodular areas of architectural distortion and ground-glass attenuation throughout the lungs bilaterally. Findings in the left lung generally appear less pronounced than the prior study, where as there are increasing areas of ground-glass attenuation in the right lung. These remain nonspecific, and warrant continued attention on future followup studies. Given that the primary lesion was a squamous cell carcinoma, these ground-glass attenuation areas are more likely post treatment related, or of infectious or inflammatory etiology, rather than evidence of metastasis (this appearance would be more typical for metastasis from a primary adenocarcinoma than a squamous cell carcinoma). 2. Atherosclerosis, including 3 vessel coronary artery disease, with evidence of scarring from prior left circumflex coronary artery territory myocardial infarction. Status post median sternotomy for CABG. 3. Small amount of pericardial thickening and calcification again noted. No pericardial effusion. 4. Additional incidental findings, as above   ECHO 03/2014 MILD LV DYSFUNCTION  NORMAL RIGHT VENTRICULAR SYSTOLIC FUNCTION VALVULAR REGURGITATION: MILD MR, TRIVIAL PR, MILD TR PROSTHETIC VALVE(S): PROSTHETIC MV RING    Assessment and Plan: 80 year old female past medical history of chronic cough, COPD, stage IIIa non-small cell lung cancer, status post chemoradiation, seen in follow-up visit for recurrent cough, suspected postradiation pneumonitis/fibrosis.     Radiation pneumonitis [facility]) CT images reviewed, there are right upper lobe changes, that has a temporal relationship to recent radiation which have now stayed the same, but with architectural distortion in the left lung Her last radiation dose was August 2016, since then has required 3 subsequent prednisone tapers. Current CT chest reviewed with patient. 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  Left  with nodular and mass-like areas of distortion, which is improved from Feb 2016 CT chest scan.  New basilar GGO areas in the left  lung. Will continue with supportive care. Patient has had 3 CT scan in the last 6 months, with gradual improvement, therefore will follow up with CXRs, and perform another CT chest scan in 3 months if symptoms persist.    Plan: -Prednisone 15 mg daily until 01/21/2016, and then on 525/17 prednisone '10mg'$  until follow-up visit - repeat CXR prior to follw up visit       Cough Multifactorial: Postradiation pneumonitis, fibrosis, architectural distortion, anatomical distortion of the right mainstem, right hilum inflammation, left lower lobe mass, COPD, Patient will likely have chronic cough, and some level of chronic dyspnea level the persistent post radiation fibrosis in the right lung.  Had a mild exacerbation, but is now improving.  Advised to continue with allergy regiment - restart nasal saline rinses, or flonase  Plan: -Hold current inhalers which include Spiriva, and Advair -Treat radiation pneumonitis/fibrosis with prednisone - restart nasal saline rinses (2-3 times per week), or flonase       Mass of lower lobe of left lung left lower lobe measuring 3.3 x 4.7 cm  CT scan chest 10/06/2015 - now resolved on recent CT Chest scan (11/24/15), there are new areas of GGO on the left base, which I will continue to treat as an inflammatory process with antibiotics and steroids.   Plan: -Given negative pathology from recent workup from bronchoscopy, will follow up with CXR, she has completed a course of Augmentin since her last visit.  -Given 3 CT scan in the last 6 months, will follow up with CXR, and consider repeat Chest in 2-3 months if needed. Patient in agreement with this plan.           Updated Medication List Outpatient Encounter Prescriptions as of 01/14/2016  Medication Sig  . apixaban (ELIQUIS) 5 MG TABS tablet Take 5 mg by mouth 2 (two)  times daily.  Marland Kitchen atorvastatin (LIPITOR) 20 MG tablet Take 20 mg by mouth daily at 6 PM.   . Cetirizine HCl 10 MG CAPS Take 1 capsule (10 mg total) by mouth 1 day or 1 dose.  Marland Kitchen ENTRESTO 24-26 MG TAKE 1 TABLET BY MOUTH TWICE A DAY  . ipratropium-albuterol (DUONEB) 0.5-2.5 (3) MG/3ML SOLN TAKE 3ML BY NEBULIZER EVERY 6 HOURS AS NEEDED  . levothyroxine (SYNTHROID, LEVOTHROID) 75 MCG tablet Take 75 mcg by mouth daily before breakfast.  . metoprolol succinate (TOPROL-XL) 25 MG 24 hr tablet TAKE 1 TABLET BY MOUTH EVERY MORNING AND 2 TABS IN THE EVENING  . omeprazole (PRILOSEC) 20 MG capsule Take 1 capsule (20 mg total) by mouth every morning.  . prednisoLONE (ORAPRED ODT) 15 MG disintegrating tablet Take 15 mg by mouth daily.   Facility-Administered Encounter Medications as of 01/14/2016  Medication  . sodium chloride 0.9 % injection 10 mL    Orders for this visit: No orders of the defined types were placed in this encounter.     Thank  you for the consultation and for allowing [facility] to assist in the care of your patient. Our recommendations are noted above.  Please contact us if we can be of further service.   Vilinda Boehringer, MD [facility] Office Number: [phone]

## 2016-01-14 NOTE — Patient Instructions (Addendum)
Follow up with Dr. Stevenson Clinch in: 6 weeks - Prednisone '15mg'$  until 01/21/16, then on 01/22/16, start prednisone '10mg'$  daily with breakfast until follow up visit with Dr. Stevenson Clinch - cont with Prilosec daily - '20mg'$  - 1 tab daily prior to breakfast, take with full glass of water - [place] nebulizer - 1 treatment every 8 hrs as need for wheezing/sob/coughing (cyclical) - 2 view CXR prior to follow up visit - incentive spirometry 10-15 times per day, daily.  - may use nasal saline rinse 2-3 times per week for allergies, or can use OTC nasal allergy spray (flonase, etc).

## 2016-01-14 NOTE — Assessment & Plan Note (Signed)
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  Had a mild exacerbation, but is now improving.  Advised to continue with allergy regiment - restart nasal saline rinses, or flonase  Plan: -Hold current inhalers which include Spiriva, and Advair -Treat radiation pneumonitis/fibrosis with prednisone - restart nasal saline rinses (2-3 times per week), or flonase

## 2016-01-14 NOTE — Assessment & Plan Note (Signed)
CT images reviewed, there are right upper lobe changes, that has a temporal relationship to recent radiation which have now stayed the same, but with architectural distortion in the left lung Her last radiation dose was August 2016, since then has required 3 subsequent prednisone tapers. Current CT chest reviewed with patient. 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  Left with nodular and mass-like areas of distortion, which is improved from Feb 2016 CT chest scan.  New basilar GGO areas in the left lung. Will continue with supportive care. Patient has had 3 CT scan in the last 6 months, with gradual improvement, therefore will follow up with CXRs, and perform another CT chest scan in 3 months if symptoms persist.    Plan: -Prednisone 15 mg daily until 01/21/2016, and then on 525/17 prednisone '10mg'$  until follow-up visit - repeat CXR prior to follw up visit

## 2016-01-19 ENCOUNTER — Encounter: Payer: Self-pay | Admitting: Radiation Oncology

## 2016-01-19 ENCOUNTER — Ambulatory Visit
Admission: RE | Admit: 2016-01-19 | Discharge: 2016-01-19 | Disposition: A | Discharge status: Home / Self Care | Payer: Medicare Other | Source: Ambulatory Visit | Attending: Radiation Oncology | Admitting: Radiation Oncology

## 2016-01-19 ENCOUNTER — Ambulatory Visit
Admission: RE | Admit: 2016-01-19 | Discharge: 2016-01-19 | Disposition: A | Discharge status: Home / Self Care | Payer: Medicare Other | Source: Ambulatory Visit | Attending: Internal Medicine | Admitting: Internal Medicine

## 2016-01-19 ENCOUNTER — Other Ambulatory Visit: Payer: Self-pay

## 2016-01-19 VITALS — BP 117/83 | HR 88 | Temp 94.5°F | Resp 20 | Wt 163.3 lb

## 2016-01-19 DIAGNOSIS — R0600 Dyspnea, unspecified: Secondary | ICD-10-CM

## 2016-01-19 DIAGNOSIS — Z952 Presence of prosthetic heart valve: Secondary | ICD-10-CM | POA: Insufficient documentation

## 2016-01-19 DIAGNOSIS — C3491 Malignant neoplasm of unspecified part of right bronchus or lung: Secondary | ICD-10-CM

## 2016-01-19 NOTE — Progress Notes (Signed)
Radiation Oncology Follow up Note  Name: Jenny Cooper   Date:   01/19/2016 MRN:  [id number] DOB: October 26, 1935    This 80 y.o. female presents to the clinic today for follow-up for stage IIIa squamous cell carcinoma the right lung hilum now out 1 year. Status post concurrent chemoradiation  REFERRING PROVIDER: Gayland Curry, MD  HPI: Patient is a 80 year old female now out 1 year having completed combined modality treatment with chemotherapy and radiation for stage IIIa (T4 N1 M0) squamous cell carcinoma of the right lung hilum. Seen today in routine follow-up she is doing fairly well.Marland Kitchen She's been followed by pulmonary medicine for probable pneumonia with groundglass opacities of the left lung base which is been treated with antibiotics and steroids as an inflammatory process. Also recently have bronchoscopy which was negative. Most recent plain film of the chest showed interstitial changes consistent with radiation which have improved from prior examination with no new infiltrate identified. She has a slight nonproductive cough no hemoptysis or chest tightness.  COMPLICATIONS OF TREATMENT: none  FOLLOW UP COMPLIANCE: keeps appointments   PHYSICAL EXAM:  BP 117/83 mmHg  Pulse 88  Temp(Src) 94.5 F (34.7 C)  Resp 20  Wt 163 lb 4 oz (74.05 kg) Well-developed well-nourished patient in NAD. HEENT reveals PERLA, EOMI, discs not visualized.  Oral cavity is clear. No oral mucosal lesions are identified. Neck is clear without evidence of cervical or supraclavicular adenopathy. Lungs are clear to A&P. Cardiac examination is essentially unremarkable with regular rate and rhythm without murmur rub or thrill. Abdomen is benign with no organomegaly or masses noted. Motor sensory and DTR levels are equal and symmetric in the upper and lower extremities. Cranial nerves II through XII are grossly intact. Proprioception is intact. No peripheral adenopathy or edema is identified. No motor or sensory  levels are noted. Crude visual fields are within normal range.  RADIOLOGY RESULTS: CT scans and plain films are reviewed  PLAN: Present time patient is doing well. She or he has follow-up CT scans and PET/CT scans ordered and follow-up care the next several weeks with medical oncology. She also continues close follow-up care with pulmonary service. I'm please were overall progress. I have asked to see her back in 6 months for follow-up. I've asked her to copy an 80 films are being performed for my review. Patient is to call sooner with any concerns.  I would like to take this opportunity for allowing me to participate in the care of your patient.Armstead Peaks., MD

## 2016-01-21 ENCOUNTER — Encounter: Payer: Self-pay | Admitting: Oncology

## 2016-01-21 ENCOUNTER — Inpatient Hospital Stay: Payer: Medicare Other | Attending: Oncology | Admitting: Oncology

## 2016-01-21 VITALS — BP 112/75 | HR 103 | Temp 95.8°F | Resp 18 | Wt 163.4 lb

## 2016-01-21 DIAGNOSIS — I2510 Atherosclerotic heart disease of native coronary artery without angina pectoris: Secondary | ICD-10-CM | POA: Diagnosis not present

## 2016-01-21 DIAGNOSIS — I10 Essential (primary) hypertension: Secondary | ICD-10-CM | POA: Diagnosis not present

## 2016-01-21 DIAGNOSIS — I4891 Unspecified atrial fibrillation: Secondary | ICD-10-CM | POA: Diagnosis not present

## 2016-01-21 DIAGNOSIS — I252 Old myocardial infarction: Secondary | ICD-10-CM | POA: Diagnosis not present

## 2016-01-21 DIAGNOSIS — Z7952 Long term (current) use of systemic steroids: Secondary | ICD-10-CM | POA: Insufficient documentation

## 2016-01-21 DIAGNOSIS — Z9221 Personal history of antineoplastic chemotherapy: Secondary | ICD-10-CM | POA: Diagnosis not present

## 2016-01-21 DIAGNOSIS — G620 Drug-induced polyneuropathy: Secondary | ICD-10-CM | POA: Diagnosis not present

## 2016-01-21 DIAGNOSIS — C3401 Malignant neoplasm of right main bronchus: Secondary | ICD-10-CM | POA: Insufficient documentation

## 2016-01-21 DIAGNOSIS — C771 Secondary and unspecified malignant neoplasm of intrathoracic lymph nodes: Secondary | ICD-10-CM | POA: Diagnosis not present

## 2016-01-21 DIAGNOSIS — Z79899 Other long term (current) drug therapy: Secondary | ICD-10-CM | POA: Insufficient documentation

## 2016-01-21 DIAGNOSIS — Z923 Personal history of irradiation: Secondary | ICD-10-CM | POA: Diagnosis not present

## 2016-01-21 DIAGNOSIS — Z87891 Personal history of nicotine dependence: Secondary | ICD-10-CM | POA: Insufficient documentation

## 2016-01-21 DIAGNOSIS — E785 Hyperlipidemia, unspecified: Secondary | ICD-10-CM | POA: Diagnosis not present

## 2016-01-21 DIAGNOSIS — C3491 Malignant neoplasm of unspecified part of right bronchus or lung: Secondary | ICD-10-CM

## 2016-01-21 DIAGNOSIS — R5383 Other fatigue: Secondary | ICD-10-CM | POA: Diagnosis not present

## 2016-01-21 DIAGNOSIS — Z7901 Long term (current) use of anticoagulants: Secondary | ICD-10-CM | POA: Insufficient documentation

## 2016-01-21 DIAGNOSIS — Z800 Family history of malignant neoplasm of digestive organs: Secondary | ICD-10-CM | POA: Insufficient documentation

## 2016-01-21 DIAGNOSIS — E039 Hypothyroidism, unspecified: Secondary | ICD-10-CM | POA: Diagnosis not present

## 2016-01-21 DIAGNOSIS — R05 Cough: Secondary | ICD-10-CM | POA: Insufficient documentation

## 2016-01-21 DIAGNOSIS — J449 Chronic obstructive pulmonary disease, unspecified: Secondary | ICD-10-CM | POA: Insufficient documentation

## 2016-01-21 DIAGNOSIS — R531 Weakness: Secondary | ICD-10-CM

## 2016-01-21 DIAGNOSIS — T451X5S Adverse effect of antineoplastic and immunosuppressive drugs, sequela: Secondary | ICD-10-CM | POA: Diagnosis not present

## 2016-01-21 DIAGNOSIS — Z951 Presence of aortocoronary bypass graft: Secondary | ICD-10-CM | POA: Insufficient documentation

## 2016-01-21 NOTE — Progress Notes (Signed)
Jenny Cooper @ [facility] Telephone:([phone]) [phone]  Fax:([phone]) [phone]     Rhegan Trunnell OB: 1935-09-18  MR#: [id number]  WLN#:[id number]  Patient Care Team: Glean Hess, MD as PCP - General (Family Medicine) Minna Merritts, MD as Consulting Physician (Cardiology) Vilinda Boehringer, MD as Consulting Physician (Pulmonary Disease)  CHIEF COMPLAINT:  Chief Complaint  Patient presents with  . 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   Patient had compression of the right mainstem bronchus because of enlarged lymph node and a mass. Stage is T4 N1 M0 tumor stage IIIa diagnosis in January of 2016 2.started on radiation and chemotherapy from October 21, 2014 3.finished 6 cycles of carboplatinum and Taxol  in March  29 th of 2016, As finished concurrent chemoradiation therapy and PET scan shows significant response 4. started on consolidation chemotherapy with carboplatinum and Taxol 2 cycle   Patient finished 2 cycles of consultation therapy on March 06, 2015   5.  Atrial fibrillation diagnosis in August of 2016 on  eloquis 6.  Repeat bronchoscopy was negative for any malignancy      INTERVAL HISTORY: 80 year old lady was finished chemoradiation therapy came today further follow-up regarding carcinoma of lung.     Increasing shortness of breath.  Feeling weak and tired.. Patient is on prednisone with slow taper.  Had a repeat CT scan.  Cough and shortness of breath is improved. Patient is here for ongoing evaluation and treatment consideration  Patient is here for ongoing evaluation and treatment consideration.  Shortness of breath is improved. Repeat CT scan scheduled by pulmonologist REVIEW OF SYSTEMS:    general status: Patient is feeling stronger.  Cough and shortness change in a performance status.   No chills.  No fever. HEENT: Alopecia, resolving.  No evidence of stomatitis Lungs: Patient started increasing cough , increasing shortness of breath Cardiac: No chest pain or paroxysmal nocturnal dyspnea GI: No nausea no vomiting no diarrhea no abdominal pain Skin: No rash Lower extremity no swelling Neurological system: Grade 1 neuropathy  No other focal signs  Musculoskeletal system no bony pains  As per HPI. Otherwise, a complete review of systems is negatve.  PAST MEDICAL HISTORY: Past Medical History  Diagnosis Date  . HTN (hypertension)   . Pacemaker     a. MDT 2002; b. generator replacement 2013; c. followed by Dr. Omelia Blackwater, MD  . HLD (hyperlipidemia)   . CAD (coronary artery disease)     a. s/p MI x 2 in 2002 s/p PCI x 2 in 2002; b. s/p 2v CABG 2002; c. stress echo 07/2004 w/ evi of pos & inf infarct & no evi of ischemia; d. 4/08 dipyridamole scan w/ multiple areas of infarct, no ischemia, EF 49%; e. cath 04/28/15 3v CAD, med Rx rec, no targets for revasc, LM lum irregs, pLAD 30%, 100%, ost-pLCx 60%, [name] 99%, OM2 100%, p-mRCA 90%, [name] 100% L-R collats, VG-mLAD irregs, VG-OM2 oc  . Lung cancer ([place])   . History of colonoscopy 2013  . History of mammography, screening 2015  . History of Papanicolaou smear of cervix 2013  . Carcinoma of right lung ([place]) 01/03/2015    a. followed by Dr. Oliva Bustard  . Mitral regurgitation     a. s/p mitral ring placement 09/2000; b. echo 09/2010: EF 50%, inf HK,  post AK, mild MR, prosthetic mitral valve ring w/ peak gradient of 10 mmHg; b. echo 2/13: EF 50%, mild MR/TR     . History of blood clots     12/2001  . Atypical atrial flutter ([place])     a. s/p ablation 07/27/2013 followed by Dr. Rockey Situ  . PAF (paroxysmal atrial fibrillation) ([facility])     a. on Eliquis   . COPD (chronic obstructive pulmonary disease) ([place])   . Chronic systolic CHF (congestive heart failure) ([place])     a. echo 03/2015: EF 30-35%, sev ant/inf/pos HK, in mild to mod MR  .  Myocardial infarction ([place])     X 2 (LAST ONE IN 2002)  . Hypothyroidism   . GERD (gastroesophageal reflux disease)   . Neuropathy ([place])     PAST SURGICAL HISTORY: Past Surgical History  Procedure Laterality Date  . Appendectomy    . Vaginal hysterectomy      partial - left ovary remains  . Coronary artery bypass graft  09/2000  . Pacemaker insertion  03/2012  . Cardiac catheterization N/A 04/28/2015    Procedure: Left Heart Cath and Coronary Angiography;  Surgeon: Wellington Hampshire, MD;  Location: [place] [facility];  Service: Cardiovascular;  Laterality: N/A;  . Endobronchial ultrasound N/A 10/06/2015    Procedure: ENDOBRONCHIAL ULTRASOUND;  Surgeon: Flora Lipps, MD;  Location: [facility];  Service: Cardiopulmonary;  Laterality: N/A;  . Electromagnetic navigation brochoscopy N/A 10/06/2015    Procedure: ELECTROMAGNETIC NAVIGATION BRONCHOSCOPY;  Surgeon: Flora Lipps, MD;  Location: [facility];  Service: Cardiopulmonary;  Laterality: N/A;  . Colonoscopy  12/2009    2 small tubular adenomas    FAMILY HISTORY Family History  Problem Relation Age of Onset  . COPD Mother     sister, and brother  . Heart attack Neg Hx   . Stroke Maternal Grandmother   . Hypertension Sister   . Hypertension Brother   . Colon cancer Brother     age 61        [place] does not have any advanced healthcare directive. Information has been given. HEALTH MAINTENANCE: Social History  Substance Use Topics  . Smoking status: Former Smoker -- 2.00 packs/day for 42 years    Types: Cigarettes    Quit date: 08/30/2000  . Smokeless tobacco: None     Comment: quit smoking in 08/28/2000  . Alcohol Use: 6.0 oz/week    10 Glasses of wine per week     Comment: 2 glasses of wine per day     Allergies  Allergen Reactions  . Lovenox [Enoxaparin Sodium] Itching  . Meperidine Other (See Comments)    Other Reaction: pt does not like how it makes her feel    Current Outpatient Prescriptions    Medication Sig Dispense Refill  . apixaban (ELIQUIS) 5 MG TABS tablet Take 5 mg by mouth 2 (two) times daily.    Marland Kitchen atorvastatin (LIPITOR) 20 MG tablet Take 20 mg by mouth daily at 6 PM.     . azelastine (ASTELIN) 0.1 % nasal spray Place into the nose.    Marland Kitchen Cetirizine HCl 10 MG CAPS Take 1 capsule (10 mg total) by mouth 1 day or 1 dose. 30 capsule 5  . ENTRESTO 24-26 MG TAKE 1 TABLET BY MOUTH TWICE A DAY 60 tablet 5  . ipratropium-albuterol (DUONEB) 0.5-2.5 (3) MG/3ML SOLN TAKE 3ML BY NEBULIZER EVERY 6 HOURS AS NEEDED  2  . levothyroxine (SYNTHROID, LEVOTHROID) 75 MCG tablet Take 75 mcg  by mouth daily before breakfast.    . metoprolol succinate (TOPROL-XL) 25 MG 24 hr tablet TAKE 1 TABLET BY MOUTH EVERY MORNING AND 2 TABS IN THE EVENING 90 tablet 3  . omeprazole (PRILOSEC) 20 MG capsule Take 1 capsule (20 mg total) by mouth every morning. 30 capsule 6  . prednisoLONE (ORAPRED ODT) 15 MG disintegrating tablet Take 15 mg by mouth daily.    . predniSONE (DELTASONE) 5 MG tablet Take 3tabs ('15mg'$ ) until 01/21/16 starting 01/22/16 take 2 tabs ('10mg'$ ) 100 tablet 0  . vitamin B-12 (CYANOCOBALAMIN) 1000 MCG tablet Take 1,000 mcg by mouth daily.     No current facility-administered medications for this visit.   Facility-Administered Medications Ordered in Other Visits  Medication Dose Route Frequency Provider Last Rate Last Dose  . sodium chloride 0.9 % injection 10 mL  10 mL Intravenous PRN Forest Gleason, MD   10 mL at 02/19/15 1000    OBJECTIVE:  Filed Vitals:   01/21/16 0920  BP: 112/75  Pulse: 103  Temp: 95.8 F (35.4 C)  Resp: 18     Body mass index is 25.19 kg/(m^2).    ECOG FS:1 - Symptomatic but completely ambulatory  PHYSICAL EXAM: GENERAL:  Patient's condition has been gradually declining.  Has increasing shortness of breath.  Feeling more and more weak and tired. MENTAL STATUS:  Alert and oriented to person, place and time. HEAD: alopecia Normocephalic, atraumatic, face symmetric, no  Cushingoid features. EYES: eyes.  Pupils equal round and reactive to light and accomodation.  No conjunctivitis or scleral icterus. ENT:  Oropharynx clear without lesion.  Tongue normal. Mucous membranes moist.  RESPIRATORY:  Occasional rhonchi CARDIOVASCULAR:  Regular rate and rhythm without murmur, rub or gallop. . ABDOMEN:  Soft, non-tender, with active bowel sounds, and no hepatosplenomegaly.  No masses. BACK:  No CVA tenderness.  No tenderness on percussion of the back or rib cage. SKIN:  No rashes, ulcers or lesions. EXTREMITIES: No edema, no skin discoloration or tenderness.  No palpable cords. LYMPH NODES: No palpable cervical, supraclavicular, axillary or inguinal adenopathy  NEUROLOGICAL: Unremarkable. PSYCH:  Appropriate.   LAB RESULTS:  No visits with results within 3 Day(s) from this visit. Latest known visit with results is:  Office Visit on 01/05/2016  Component Date Value Ref Range Status  . Hgb A1c MFr Bld 01/05/2016 7.5* 4.8 - 5.6 % Final   Comment:          Pre-diabetes: 5.7 - 6.4          Diabetes: >6.4          Glycemic control for adults with diabetes: <7.0   . Est. average glucose Bld gHb Est-m* 01/05/2016 169   Final    Lab data has been reviewed.  Assessment and plan Stage III non-small cell carcinoma of lung.. Patient is on slow taper prednisone CT scan as being repeated and has been reviewed independently. There is significant improvement.  By x-ray as well as symptomatically.  Recent pelvic CT scan in next few weeks and will be evaluated by pulmonologist States has been reviewed independently a CT scan has been scheduled in 4 months for restaging.  Patient was advised to call us if he develops any new symptoms.     Forest Gleason, MD   01/21/2016 9:55 AM

## 2016-01-26 ENCOUNTER — Encounter: Payer: Self-pay | Admitting: Oncology

## 2016-01-28 ENCOUNTER — Encounter: Payer: Self-pay | Admitting: Cardiovascular Disease

## 2016-02-09 ENCOUNTER — Other Ambulatory Visit: Payer: Self-pay | Admitting: Physician Assistant

## 2016-02-13 ENCOUNTER — Encounter: Payer: Self-pay | Admitting: Physician Assistant

## 2016-02-16 ENCOUNTER — Ambulatory Visit (INDEPENDENT_AMBULATORY_CARE_PROVIDER_SITE_OTHER): Payer: Medicare Other | Admitting: Cardiovascular Disease

## 2016-02-16 ENCOUNTER — Inpatient Hospital Stay: Payer: Medicare Other | Attending: Internal Medicine

## 2016-02-16 ENCOUNTER — Encounter: Payer: Self-pay | Admitting: Cardiovascular Disease

## 2016-02-16 VITALS — BP 126/80 | HR 75 | Ht 67.5 in | Wt 165.8 lb

## 2016-02-16 DIAGNOSIS — E785 Hyperlipidemia, unspecified: Secondary | ICD-10-CM

## 2016-02-16 DIAGNOSIS — C771 Secondary and unspecified malignant neoplasm of intrathoracic lymph nodes: Secondary | ICD-10-CM | POA: Insufficient documentation

## 2016-02-16 DIAGNOSIS — I4819 Other persistent atrial fibrillation: Secondary | ICD-10-CM

## 2016-02-16 DIAGNOSIS — I5022 Chronic systolic (congestive) heart failure: Secondary | ICD-10-CM | POA: Diagnosis not present

## 2016-02-16 DIAGNOSIS — Z923 Personal history of irradiation: Secondary | ICD-10-CM | POA: Insufficient documentation

## 2016-02-16 DIAGNOSIS — I25810 Atherosclerosis of coronary artery bypass graft(s) without angina pectoris: Secondary | ICD-10-CM

## 2016-02-16 DIAGNOSIS — C3401 Malignant neoplasm of right main bronchus: Secondary | ICD-10-CM | POA: Diagnosis present

## 2016-02-16 DIAGNOSIS — J441 Chronic obstructive pulmonary disease with (acute) exacerbation: Secondary | ICD-10-CM

## 2016-02-16 DIAGNOSIS — I10 Essential (primary) hypertension: Secondary | ICD-10-CM

## 2016-02-16 DIAGNOSIS — E119 Type 2 diabetes mellitus without complications: Secondary | ICD-10-CM

## 2016-02-16 DIAGNOSIS — Z9221 Personal history of antineoplastic chemotherapy: Secondary | ICD-10-CM | POA: Diagnosis not present

## 2016-02-16 DIAGNOSIS — Z452 Encounter for adjustment and management of vascular access device: Secondary | ICD-10-CM | POA: Insufficient documentation

## 2016-02-16 DIAGNOSIS — I481 Persistent atrial fibrillation: Secondary | ICD-10-CM

## 2016-02-16 DIAGNOSIS — Z95828 Presence of other vascular implants and grafts: Secondary | ICD-10-CM

## 2016-02-16 MED ORDER — FUROSEMIDE 20 MG PO TABS: 20.0000 mg | ORAL_TABLET | Freq: Every day | ORAL | Status: DC | PRN

## 2016-02-16 MED ORDER — SODIUM CHLORIDE 0.9% FLUSH
10.0000 mL | INTRAVENOUS | Status: AC | PRN
Start: 2016-02-16 — End: ?
  Administered 2016-02-16: 10 mL via INTRAVENOUS
  Filled 2016-02-16: qty 10

## 2016-02-16 MED ORDER — HEPARIN SOD (PORK) LOCK FLUSH 100 UNIT/ML IV SOLN
500.0000 [IU] | Freq: Once | INTRAVENOUS | Status: AC
  Administered 2016-02-16: 500 [IU] via INTRAVENOUS

## 2016-02-16 NOTE — Patient Instructions (Signed)
You are doing well. No medication changes were made.  We will check lipids today  Please call us if you have new issues that need to be addressed before your next appt.  Your physician wants you to follow-up in: 6 months.  You will receive a reminder letter in the mail two months in advance. If you don't receive a letter, please call our office to schedule the follow-up appointment.

## 2016-02-16 NOTE — Progress Notes (Signed)
Patient ID: Jenny Cooper, female   DOB: 1935-11-26, 80 y.o.   MRN: [id number] Cardiology Office Note  Date:  02/16/2016   ID:  Jenny Cooper, Jenny Cooper 1936-07-18, MRN [id number]  PCP:  Halina Maidens, MD   Chief Complaint  Patient presents with  . Follow-up    no cp, patient has had sob and swelling in her feet an dankles. no other complaints.    HPI:  80 y.o. female with h/o CAD s/p remote history of MI in 2002 s/p 2 vessel CABG post stenting in 2002, history of mitral valve repair in 2002 secondary to mitral regurgitation, history of PAF s/p prior TEE/DCCV on Eliquis, atypical atrial flutter s/p ablation on 07/27/2013 s/p MDT PPM, carcinoma of right lung, COPD, HTN, and HLD who presented to [facility] on [facility]15 with increased SOB and was found to have PNA, possibly post obstructive and elevated troponin. She presents today to the clinic for follow-up of her atrial fibrillation Quit smoking in 2001   breathing is slowly improving on prednisone taper She has been on 10 mg of prednisone , managed by Dr. Eber Jones in her face, hot flash Unclear to what extent the atrial fibrillation is affecting her breathing She is being managed by primary care and Dr. Stevenson Clinch Denies any leg edema, no abdominal swelling, no chest pain symptoms Swelling from predisone She does report having a cough when she lays on her right side, several recent episodes of shortness of breath when supine  CT scan September 2016 showing improvement of her lung cancer She finished her cancer treatment over the summer 2016, she had chemotherapy and radiation  EKG on today's visit shows paced rhythm, ventricular rate 75 bpm, underlying atrial fibrillation EKG August 2016 showed normal sinus rhythm  Other past medical history TEE/DCCV in 2013 for her PAF. In 2014 she was diagnosed with atypical atrial flutter and underwent ablation. underwent PPM generator change in 2013.   stage IIIa squamous cell lung cancer of  the right lung hilum in January 2016.  finished concurrent chemoradiation with carboplatinum and Taxol and PET scan showed significant response.   present to [facility] on 8[facility]. She complained of increase SOB, cough that was productive of green to yellow sputum, nausea, and vomiting.  required BiPAP for a short time upon her arrival. CXR showed bibasilar airspace disease right greater than left has progressed significantly from the prior study, worrisome for recurrent carcinoma however pneumonia could also have this appearance. CT chest has been ordered.   Troponin was found to be 0.48-->1.83.  Echo showed EF 30-35%, severe anterior and infero/posterior wall HK. Left ventricular function parameters were normal, mild to moderate MR. Left atrium was mildly dilated. RV systolic function was normal. Mild to moderate TR. PASP was moderately to severely elevated at 60 mm Hg.   outpatient cardiac cath on 04/28/2015 that showed 3 vessel CAD with patent SVG to LAD, occluded SVG to OM, native RCA had severe ISR in the mid-segment and was occluded distally at the sites of previously placed stents. There were left to right collaterals. Moderately reduced LVSF with EF of 35-40%. Mildly elevated LVEDP. There were no good targets for revascularization. medical therapy.   cardiac event monitor to evaluate her Afib burden that showed 50% Afib burden with a peak heart rate of 116, mostly rate controlled. Given this finding her Toprol was titrated up on 9/6 to 50 mg bid.   PMH:   has a past medical history of HTN (hypertension); Pacemaker; HLD (  hyperlipidemia); CAD (coronary artery disease); Lung cancer ([place]); History of colonoscopy (2013); History of mammography, screening (2015); History of Papanicolaou smear of cervix (2013); Carcinoma of right lung ([place]) (01/03/2015); Mitral regurgitation; History of blood clots; Atypical atrial flutter ([facility]); PAF (paroxysmal atrial fibrillation) ([place]); COPD (chronic obstructive  pulmonary disease) ([place]); Chronic systolic CHF (congestive heart failure) ([place]); Myocardial infarction ([place]); Hypothyroidism; GERD (gastroesophageal reflux disease); and Neuropathy ().  PSH:    Past Surgical History  Procedure Laterality Date  . Appendectomy    . Vaginal hysterectomy      partial - left ovary remains  . Coronary artery bypass graft  09/2000  . Pacemaker insertion  03/2012  . Cardiac catheterization N/A 04/28/2015    Procedure: Left Heart Cath and Coronary Angiography;  Surgeon: Wellington Hampshire, MD;  Location: [facility];  Service: Cardiovascular;  Laterality: N/A;  . Endobronchial ultrasound N/A 10/06/2015    Procedure: ENDOBRONCHIAL ULTRASOUND;  Surgeon: Flora Lipps, MD;  Location: [facility];  Service: Cardiopulmonary;  Laterality: N/A;  . Electromagnetic navigation brochoscopy N/A 10/06/2015    Procedure: ELECTROMAGNETIC NAVIGATION BRONCHOSCOPY;  Surgeon: Flora Lipps, MD;  Location: [facility];  Service: Cardiopulmonary;  Laterality: N/A;  . Colonoscopy  12/2009    2 small tubular adenomas    Current Outpatient Prescriptions  Medication Sig Dispense Refill  . apixaban (ELIQUIS) 5 MG TABS tablet Take 5 mg by mouth 2 (two) times daily.    Marland Kitchen atorvastatin (LIPITOR) 20 MG tablet Take 20 mg by mouth daily at 6 PM.     . azelastine (ASTELIN) 0.1 % nasal spray Place into the nose.    Marland Kitchen Cetirizine HCl 10 MG CAPS Take 1 capsule (10 mg total) by mouth 1 day or 1 dose. 30 capsule 5  . ENTRESTO 24-26 MG TAKE 1 TABLET BY MOUTH TWICE A DAY 60 tablet 5  . ipratropium-albuterol (DUONEB) 0.5-2.5 (3) MG/3ML SOLN TAKE 3ML BY NEBULIZER EVERY 6 HOURS AS NEEDED  2  . levothyroxine (SYNTHROID, LEVOTHROID) 75 MCG tablet Take 75 mcg by mouth daily before breakfast.    . metoprolol succinate (TOPROL-XL) 25 MG 24 hr tablet TAKE 1 TABLET BY MOUTH EVERY MORNING AND 2 TABS IN THE EVENING 90 tablet 3  . omeprazole (PRILOSEC) 20 MG capsule Take 1 capsule (20 mg total) by mouth every morning. 30  capsule 6  . predniSONE (DELTASONE) 5 MG tablet Take 3tabs ('15mg'$ ) until 01/21/16 starting 01/22/16 take 2 tabs ('10mg'$ ) 100 tablet 0  . vitamin B-12 (CYANOCOBALAMIN) 1000 MCG tablet Take 1,000 mcg by mouth daily.    . furosemide (LASIX) 20 MG tablet Take 1 tablet (20 mg total) by mouth daily as needed. 30 tablet 3   No current facility-administered medications for this visit.   Facility-Administered Medications Ordered in Other Visits  Medication Dose Route Frequency Provider Last Rate Last Dose  . sodium chloride 0.9 % injection 10 mL  10 mL Intravenous PRN Forest Gleason, MD   10 mL at 02/19/15 1000  . sodium chloride flush (NS) 0.9 % injection 10 mL  10 mL Intravenous PRN Cammie Sickle, MD   10 mL at 02/16/16 1048     Allergies:   Lovenox and Meperidine   Social History:  The patient  reports that she quit smoking about 15 years ago. Her smoking use included Cigarettes. She has a 84 pack-year smoking history. She does not have any smokeless tobacco history on file. She reports that she drinks about 6.0 oz of alcohol per week.  She reports that she does not use illicit drugs.   Family History:   family history includes COPD in her mother; Colon cancer in her brother; Hypertension in her brother and sister; Stroke in her maternal grandmother. There is no history of Heart attack.    Review of Systems: Review of Systems  Constitutional:       Weight gain, facial puffiness  Respiratory: Negative.   Cardiovascular: Negative.   Gastrointestinal: Negative.   Musculoskeletal: Negative.   Neurological: Negative.   Psychiatric/Behavioral: Negative.   All other systems reviewed and are negative.    PHYSICAL EXAM: VS:  BP 126/80 mmHg  Pulse 75  Ht 5' 7.5" (1.715 m)  Wt 165 lb 12.8 oz (75.206 kg)  BMI 25.57 kg/m2 , BMI Body mass index is 25.57 kg/(m^2). GEN: Well nourished, well developed, in no acute distress HEENT: normal Neck: no JVD, carotid bruits, or masses Cardiac: RRR; no  murmurs, rubs, or gallops,no edema  Respiratory:  clear to auscultation bilaterally, normal work of breathing GI: soft, nontender, nondistended, + BS MS: no deformity or atrophy Skin: warm and dry, no rash Neuro:  Strength and sensation are intact Psych: euthymic mood, full affect    Recent Labs: 04/03/2015: TSH 0.679 04/14/2015: Magnesium 1.9 12/23/2015: ALT 32; BUN 22*; Creatinine, Ser 0.90; Hemoglobin 13.9; Platelets 211; Potassium 5.5*; Sodium 139    Lipid Panel No results found for: CHOL, HDL, LDLCALC, TRIG    Wt Readings from Last 3 Encounters:  02/16/16 165 lb 12.8 oz (75.206 kg)  01/21/16 163 lb 5.8 oz (74.1 kg)  01/19/16 163 lb 4 oz (74.05 kg)       ASSESSMENT AND PLAN:  Essential hypertension - Plan: EKG 12-Lead Blood pressure is well controlled on today's visit. No changes made to the medications.  Persistent atrial fibrillation ([place]) - Plan: EKG 12-Lead Relatively asymptomatic atrial fibrillation, maintained on anticoagulation  Hyperlipidemia - Plan: Lipid Profile Encouraged her to stay on her Lipitor  Chronic systolic CHF (congestive heart failure) ([place]) I'm concerned some of her shortness of breath and cough when supine is secondary to fluid This was discussed with her in detail, recommended she take Lasix very sparingly 2 or 3 times per week No potassium needed as potassium is elevated  DM type 2, goal A1c below 7 Recent worsening of her hemoglobin A1c up to 7.5 likely from weight gain. Discussed a aggressive diet Likely some contribution from her prednisone  COPD exacerbation ([place]) She reports slow steady progress, she is eager 2, prednisone Followed by pulmonary   Total encounter time more than 25 minutes  Greater than 50% was spent in counseling and coordination of care with the patient   Disposition:   F/U  6 months   Orders Placed This Encounter  Procedures  . Lipid Profile  . EKG 12-Lead     Signed, Esmond Plants, M.D.,  Ph.D. 02/16/2016  [place], [place]

## 2016-02-17 LAB — LIPID PANEL
Chol/HDL Ratio: 2.3 ratio units (ref 0.0–4.4)
Cholesterol, Total: 128 mg/dL (ref 100–199)
HDL: 56 mg/dL (ref >39)
LDL Calculated: 45 mg/dL (ref 0–99)
Triglycerides: 133 mg/dL (ref 0–149)
VLDL CHOLESTEROL CAL: 27 mg/dL (ref 5–40)

## 2016-02-24 ENCOUNTER — Ambulatory Visit (INDEPENDENT_AMBULATORY_CARE_PROVIDER_SITE_OTHER): Payer: Medicare Other | Admitting: Internal Medicine

## 2016-02-24 VITALS — BP 130/72 | HR 95 | Ht 68.0 in | Wt 165.2 lb

## 2016-02-24 DIAGNOSIS — I25810 Atherosclerosis of coronary artery bypass graft(s) without angina pectoris: Secondary | ICD-10-CM | POA: Diagnosis not present

## 2016-02-24 DIAGNOSIS — R05 Cough: Secondary | ICD-10-CM

## 2016-02-24 DIAGNOSIS — J700 Acute pulmonary manifestations due to radiation: Secondary | ICD-10-CM

## 2016-02-24 DIAGNOSIS — R059 Cough, unspecified: Secondary | ICD-10-CM

## 2016-02-24 DIAGNOSIS — J438 Other emphysema: Secondary | ICD-10-CM | POA: Diagnosis not present

## 2016-02-24 MED ORDER — ALBUTEROL SULFATE HFA 108 (90 BASE) MCG/ACT IN AERS: 2.0000 | INHALATION_SPRAY | Freq: Four times a day (QID) | RESPIRATORY_TRACT | Status: DC | PRN

## 2016-02-24 MED ORDER — FLUTICASONE FUROATE-VILANTEROL 100-25 MCG/INH IN AEPB: 1.0000 | INHALATION_SPRAY | Freq: Every day | RESPIRATORY_TRACT | Status: AC

## 2016-02-24 NOTE — Progress Notes (Signed)
Patient ID: Jenny Cooper, female   DOB: 05-09-1936, 80 y.o.   MRN: [id number]  Patient seen in the office today and instructed on use of BREO ELLIPTA.  Patient expressed understanding and demonstrated technique.

## 2016-02-24 NOTE — Patient Instructions (Addendum)
Follow up with Dr. Stevenson Clinch in:4 weeks - Prednisone '5mg'$  daily until follow up visit - we will give you a sample of Breo 100/25 - 1 puff daily in the AM, -gargle and rinse after each use.  - start wean off daily use of nebulizer - albuterol inhaler - 2puff every 3-4 hours as needed for shortness of breath\wheezing\recurrent cough - use your flonase at night time.  - in office spirometry prior to follow up visit.

## 2016-02-24 NOTE — Assessment & Plan Note (Addendum)
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  Had a mild exacerbation, but is now improving.  Advised to continue with allergy regiment - restart nasal saline rinses, or flonase at night  Plan: -Hold current inhalers which include Spiriva, and Advair -Treat radiation pneumonitis/fibrosis with prednisone - restart nasal saline rinses (2-3 times per week), or flonase at night time -We will start her on Breo 100/25 trial for one month, 1 puff daily gargle and rinse after each use

## 2016-02-24 NOTE — Assessment & Plan Note (Addendum)
Patient was formerly a nonsmoker, subsequently diagnosed with stage III non-small cell lung cancer. Over the last year has been on intermittent doses of Advair, Spiriva, patient not consistent with use. At this time she does have components of radiation pneumonitis/fibrosis, we'll hold her inhalers due to intermittent use and financial issues. While patient should be on a maintenance inhaler, given her lung findings of continued right hilar enlargement, new left lower lobe mass, suspected radiation pneumonitis, she'll be in a chronic inflammatory state, and will subsequently be placed on prednisone. In addition, dry powder inhaler can cause worsening cough and bronchospasms, therefore will give a trial of inhaler cessation for the next 6-8 weeks.  Plan: -Patient will be on prednisone tapering doses over the next 6-8 weeks, 5 mg onto follow-up in one month -Rescue inhaler, albuterol, as needed for shortness of breath, wheezing, persistent coughing spells -We will start her on Breo 100/25 trial for one month, 1 puff daily gargle and rinse after each use

## 2016-02-24 NOTE — Assessment & Plan Note (Addendum)
CT images reviewed, there are right upper lobe changes, that has a temporal relationship to recent radiation which have now stayed the same, but with architectural distortion in the left lung Her last radiation dose was August 2016, since then has required 3 subsequent prednisone tapers. Current CT chest reviewed with patient. 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  Left with nodular and mass-like areas of distortion, which is improved from Feb 2016 CT chest scan.  New basilar GGO areas in the left lung. Will continue with supportive care. Patient has had 3 CT scan in the last 6 months, with gradual improvement, therefore will follow up with CXRs, and perform another CT chest scan in 3 months if symptoms persist.    Plan: -Prednisone 5 mg daily until follow-up visit, will then consider 2.5 mg over 2 weeks and then stop -We will start her on Breo 100/25 trial for one month, 1 puff daily gargle and rinse after each use

## 2016-02-24 NOTE — Progress Notes (Signed)
Date: 02/24/2016  MRN# [id number] Jenny Cooper 1936/01/22  PMD - Dr. Gayland Curry Jenny Cooper is a 80 y.o. old female seen in follow up for new LLL mass.   CC:  Chief Complaint  Patient presents with  . Follow-up    pt states breathing is doing well since last OV. c/o nasal congestion, clearing of throat.    Synopsis - 80 year old female first evaluated by pulmonary in early 2016 for chronic cough, found to have right hilar mass, biopsy of mass and pathology specimens positive for squamous cell, stage IIIa. Now status post chemoradiation, recently found to have left lower lobe mass, with chronic cough, chronic antibiotics. Biopsy, EBUS, ENB, of left lower lobe mass negative for malignancy  Events since last clinic visit: Patient resents today for follow-up visit of chronic cough, along with postradiation fibrosis. Patient states since her last visit, she is to using Flonase mostly in the morning, she noticed that her cough is worse in the morning and congestion, with improvement during the day. She still has postnasal drip. She has not been using Spiriva or Advair since her last visit per our recommendations. She has been using albuterol nebulizer 1 treatment daily for shortness of breath especially with exertion. Overall she does have clinical improvement and is feeling much better Today we plan to wean her steroids  PMHX:   Past Medical History  Diagnosis Date  . HTN (hypertension)   . Pacemaker     a. MDT 2002; b. generator replacement 2013; c. followed by Dr. Omelia Blackwater, MD  . HLD (hyperlipidemia)   . CAD (coronary artery disease)     a. s/p MI x 2 in 2002 s/p PCI x 2 in 2002; b. s/p 2v CABG 2002; c. stress echo 07/2004 w/ evi of pos & inf infarct & no evi of ischemia; d. 4/08 dipyridamole scan w/ multiple areas of infarct, no ischemia, EF 49%; e. cath 04/28/15 3v CAD, med Rx rec, no targets for revasc, LM lum irregs, pLAD 30%, 100%, ost-pLCx 60%, [name] 99%, OM2 100%,  p-mRCA 90%, [name] 100% L-R collats, VG-mLAD irregs, VG-OM2 oc  . Lung cancer ([place])   . History of colonoscopy 2013  . History of mammography, screening 2015  . History of Papanicolaou smear of cervix 2013  . Carcinoma of right lung ([place]) 01/03/2015    a. followed by Dr. Oliva Bustard  . Mitral regurgitation     a. s/p mitral ring placement 09/2000; b. echo 09/2010: EF 50%, inf HK, post AK, mild MR, prosthetic mitral valve ring w/ peak gradient of 10 mmHg; b. echo 2/13: EF 50%, mild MR/TR     . History of blood clots     12/2001  . Atypical atrial flutter ([place])     a. s/p ablation 07/27/2013 followed by Dr. Rockey Situ  . PAF (paroxysmal atrial fibrillation) (H[facility])     a. on Eliquis   . COPD (chronic obstructive pulmonary disease) ([place])   . Chronic systolic CHF (congestive heart failure) ([place])     a. echo 03/2015: EF 30-35%, sev ant/inf/pos HK, in mild to mod MR  . Myocardial infarction ([place])     X 2 (LAST ONE IN 2002)  . Hypothyroidism   . GERD (gastroesophageal reflux disease)   . Neuropathy [facility])    Surgical Hx:  Past Surgical History  Procedure Laterality Date  . Appendectomy    . Vaginal hysterectomy      partial - left ovary remains  . Coronary artery bypass graft  09/2000  . Pacemaker insertion  03/2012  . Cardiac catheterization N/A 04/28/2015    Procedure: Left Heart Cath and Coronary Angiography;  Surgeon: Wellington Hampshire, MD;  Location: [place] [facility];  Service: Cardiovascular;  Laterality: N/A;  . Endobronchial ultrasound N/A 10/06/2015    Procedure: ENDOBRONCHIAL ULTRASOUND;  Surgeon: Flora Lipps, MD;  Location: [facility] ORS;  Service: Cardiopulmonary;  Laterality: N/A;  . Electromagnetic navigation brochoscopy N/A 10/06/2015    Procedure: ELECTROMAGNETIC NAVIGATION BRONCHOSCOPY;  Surgeon: Flora Lipps, MD;  Location: [facility] ORS;  Service: Cardiopulmonary;  Laterality: N/A;  . Colonoscopy  12/2009    2 small tubular adenomas   Family Hx:  Family History  Problem Relation Age of Onset  .  COPD Mother     sister, and brother  . Heart attack Neg Hx   . Stroke Maternal Grandmother   . Hypertension Sister   . Hypertension Brother   . Colon cancer Brother     age 72   Social Hx:   Social History  Substance Use Topics  . Smoking status: Former Smoker -- 2.00 packs/day for 42 years    Types: Cigarettes    Quit date: 08/30/2000  . Smokeless tobacco: Not on file     Comment: quit smoking in 08/28/2000  . Alcohol Use: 6.0 oz/week    10 Glasses of wine per week     Comment: 2 glasses of wine per day   Medication:   Current Outpatient Rx  Name  Route  Sig  Dispense  Refill  . apixaban (ELIQUIS) 5 MG TABS tablet   Oral   Take 5 mg by mouth 2 (two) times daily.         Marland Kitchen atorvastatin (LIPITOR) 20 MG tablet   Oral   Take 20 mg by mouth daily at 6 PM.          . azelastine (ASTELIN) 0.1 % nasal spray   Nasal   Place into the nose.         Marland Kitchen Cetirizine HCl 10 MG CAPS   Oral   Take 1 capsule (10 mg total) by mouth 1 day or 1 dose.   30 capsule   5   . ENTRESTO 24-26 MG      TAKE 1 TABLET BY MOUTH TWICE A DAY   60 tablet   5   . furosemide (LASIX) 20 MG tablet   Oral   Take 1 tablet (20 mg total) by mouth daily as needed.   30 tablet   3   . ipratropium-albuterol (DUONEB) 0.5-2.5 (3) MG/3ML SOLN      TAKE 3ML BY NEBULIZER EVERY 6 HOURS AS NEEDED      2   . levothyroxine (SYNTHROID, LEVOTHROID) 75 MCG tablet   Oral   Take 75 mcg by mouth daily before breakfast.         . metoprolol succinate (TOPROL-XL) 25 MG 24 hr tablet      TAKE 1 TABLET BY MOUTH EVERY MORNING AND 2 TABS IN THE EVENING   90 tablet   3   . omeprazole (PRILOSEC) 20 MG capsule   Oral   Take 1 capsule (20 mg total) by mouth every morning.   30 capsule   6   . predniSONE (DELTASONE) 5 MG tablet      Take 3tabs ('15mg'$ ) until 01/21/16 starting 01/22/16 take 2 tabs ('10mg'$ )   100 tablet   0   . vitamin B-12 (CYANOCOBALAMIN) 1000 MCG tablet   Oral  Take 1,000 mcg by mouth  daily.         Marland Kitchen albuterol (PROVENTIL HFA;VENTOLIN HFA) 108 (90 Base) MCG/ACT inhaler   Inhalation   Inhale 2 puffs into the lungs every 6 (six) hours as needed for wheezing or shortness of breath.   1 Inhaler   2   . fluticasone furoate-vilanterol (BREO ELLIPTA) 100-25 MCG/INH AEPB   Inhalation   Inhale 1 puff into the lungs daily.   14 each   0       Allergies:  Lovenox and Meperidine  Review of Systems: Gen:  Denies  fever, sweats, chills HEENT: Denies blurred vision, double vision, ear pain, eye pain, hearing loss, nose bleeds, sore throat. Mild runny nose Cvc:  No dizziness, chest pain or heaviness Resp:  Sob, mild wheezing, cough - mainly non productive.  Gi: Denies swallowing difficulty, stomach pain, nausea or vomiting, diarrhea, constipation, bowel incontinence Gu:  Denies bladder incontinence, burning urine Ext:   No Joint pain, stiffness or swelling Skin: No skin rash, easy bruising or bleeding or hives Endoc:  No polyuria, polydipsia , polyphagia or weight change Psych: No depression, insomnia or hallucinations  Other:  Right hand and foot numbness  Physical Examination:   VS: BP 130/72 mmHg  Pulse 95  Ht '5\' 8"'$  (1.727 m)  Wt 165 lb 3.2 oz (74.934 kg)  BMI 25.12 kg/m2  SpO2 96%  General Appearance: No distress  Neuro:without focal findings, mental status, speech normal, alert and oriented, cranial nerves 2-12 intact, reflexes normal and symmetric, sensation grossly normal - right/left hand - sensation intact, strength 5/5 HEENT: PERRLA, EOM intact, no ptosis, no other lesions noticed; Mallampati 2 Pulmonary: coarse upper airway sounds, dec BS at the left bases, good airway movement in the RUL, diaphragmatic excursion normal.slight R sided exp wheezes (baseline),    Sputum Production:  none CardiovascularNormal S1,S2.  No m/r/g.  Abdominal aorta pulsation normal.    Abdomen: Benign, Soft, non-tender, No masses, hepatosplenomegaly, No lymphadenopathy Renal:   No costovertebral tenderness  GU:  No performed at this time. Endoc: No evident thyromegaly, no signs of acromegaly or Cushing features Skin:   warm, no rashes, no ecchymosis  Extremities: normal, no cyanosis, clubbing, no edema, warm with normal capillary refill. Other findings:none   Rad results: (The following images and results were reviewed by Dr. Stevenson Clinch on 02/24/2016). CXR 01/19/16  FINDINGS: Port-A-Cath noted with tip projected over the superior vena cava. Cardiac pacer with lead tips over the right atrium right ventricle. Prior CABG and cardiac valve replacement. Cardiomegaly with normal pulmonary vascularity. Right perihilar interstitial infiltrate/radiation changes have improved.  IMPRESSION: 1. Port-A-Cath in stable position. Cardiac pacer stable position. Prior CABG and cardiac valve replacement. Mild cardiomegaly .  2. Right perihilar interstitial changes consistent with radiation therapy have improved from prior exam. No new infiltrate identified. No focal lesion identified.   ECHO 03/2014 MILD LV DYSFUNCTION  NORMAL RIGHT VENTRICULAR SYSTOLIC FUNCTION VALVULAR REGURGITATION: MILD MR, TRIVIAL PR, MILD TR PROSTHETIC VALVE(S): PROSTHETIC MV RING    Assessment and Plan: 80 year old female past medical history of chronic cough, COPD, stage IIIa non-small cell lung cancer, status post chemoradiation, seen in follow-up visit for recurrent cough, suspected postradiation pneumonitis/fibrosis.     Radiation pneumonitis [facility]) CT images reviewed, there are right upper lobe changes, that has a temporal relationship to recent radiation which have now stayed the same, but with architectural distortion in the left lung Her last radiation dose was August 2016, since then has required  3 subsequent prednisone tapers. Current CT chest reviewed with patient. 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  Left with nodular and mass-like areas of  distortion, which is improved from Feb 2016 CT chest scan.  New basilar GGO areas in the left lung. Will continue with supportive care. Patient has had 3 CT scan in the last 6 months, with gradual improvement, therefore will follow up with CXRs, and perform another CT chest scan in 3 months if symptoms persist.    Plan: -Prednisone 5 mg daily until follow-up visit, will then consider 2.5 mg over 2 weeks and then stop -We will start her on Breo 100/25 trial for one month, 1 puff daily gargle and rinse after each use          Chronic obstructive pulmonary disease ([place]) Patient was formerly a nonsmoker, subsequently diagnosed with stage III non-small cell lung cancer. Over the last year has been on intermittent doses of Advair, Spiriva, patient not consistent with use. At this time she does have components of radiation pneumonitis/fibrosis, we'll hold her inhalers due to intermittent use and financial issues. While patient should be on a maintenance inhaler, given her lung findings of continued right hilar enlargement, new left lower lobe mass, suspected radiation pneumonitis, she'll be in a chronic inflammatory state, and will subsequently be placed on prednisone. In addition, dry powder inhaler can cause worsening cough and bronchospasms, therefore will give a trial of inhaler cessation for the next 6-8 weeks.  Plan: -Patient will be on prednisone tapering doses over the next 6-8 weeks, 5 mg onto follow-up in one month -Rescue inhaler, albuterol, as needed for shortness of breath, wheezing, persistent coughing spells -We will start her on Breo 100/25 trial for one month, 1 puff daily gargle and rinse after each use    Cough Multifactorial: Postradiation pneumonitis, fibrosis, architectural distortion, anatomical distortion of the right mainstem, right hilum inflammation, left lower lobe mass, COPD, Patient will likely have chronic cough, and some level of chronic dyspnea level the  persistent post radiation fibrosis in the right lung.  Had a mild exacerbation, but is now improving.  Advised to continue with allergy regiment - restart nasal saline rinses, or flonase at night  Plan: -Hold current inhalers which include Spiriva, and Advair -Treat radiation pneumonitis/fibrosis with prednisone - restart nasal saline rinses (2-3 times per week), or flonase at night time -We will start her on Breo 100/25 trial for one month, 1 puff daily gargle and rinse after each use            Updated Medication List Outpatient Encounter Prescriptions as of 02/24/2016  Medication Sig  . apixaban (ELIQUIS) 5 MG TABS tablet Take 5 mg by mouth 2 (two) times daily.  Marland Kitchen atorvastatin (LIPITOR) 20 MG tablet Take 20 mg by mouth daily at 6 PM.   . azelastine (ASTELIN) 0.1 % nasal spray Place into the nose.  Marland Kitchen Cetirizine HCl 10 MG CAPS Take 1 capsule (10 mg total) by mouth 1 day or 1 dose.  Marland Kitchen ENTRESTO 24-26 MG TAKE 1 TABLET BY MOUTH TWICE A DAY  . furosemide (LASIX) 20 MG tablet Take 1 tablet (20 mg total) by mouth daily as needed.  Marland Kitchen ipratropium-albuterol (DUONEB) 0.5-2.5 (3) MG/3ML SOLN TAKE 3ML BY NEBULIZER EVERY 6 HOURS AS NEEDED  . levothyroxine (SYNTHROID, LEVOTHROID) 75 MCG tablet Take 75 mcg by mouth daily before breakfast.  . metoprolol succinate (TOPROL-XL) 25 MG 24 hr tablet TAKE 1 TABLET BY MOUTH EVERY  MORNING AND 2 TABS IN THE EVENING  . omeprazole (PRILOSEC) 20 MG capsule Take 1 capsule (20 mg total) by mouth every morning.  . predniSONE (DELTASONE) 5 MG tablet Take 3tabs ('15mg'$ ) until 01/21/16 starting 01/22/16 take 2 tabs ('10mg'$ )  . vitamin B-12 (CYANOCOBALAMIN) 1000 MCG tablet Take 1,000 mcg by mouth daily.  Marland Kitchen albuterol (PROVENTIL HFA;VENTOLIN HFA) 108 (90 Base) MCG/ACT inhaler Inhale 2 puffs into the lungs every 6 (six) hours as needed for wheezing or shortness of breath.  . fluticasone furoate-vilanterol (BREO ELLIPTA) 100-25 MCG/INH AEPB Inhale 1 puff into the lungs daily.    Facility-Administered Encounter Medications as of 02/24/2016  Medication  . sodium chloride 0.9 % injection 10 mL  . sodium chloride flush (NS) 0.9 % injection 10 mL    Orders for this visit: No orders of the defined types were placed in this encounter.     Thank  you for the consultation and for allowing [facility] to assist in the care of your patient. Our recommendations are noted above.  Please contact us if we can be of further service.   Vilinda Boehringer, MD [facility] Office Number: [phone]

## 2016-03-10 ENCOUNTER — Encounter: Payer: Self-pay | Admitting: Internal Medicine

## 2016-03-10 ENCOUNTER — Ambulatory Visit (INDEPENDENT_AMBULATORY_CARE_PROVIDER_SITE_OTHER): Payer: Medicare Other | Admitting: Internal Medicine

## 2016-03-10 VITALS — BP 116/82 | HR 102 | Temp 98.2°F | Resp 16 | Ht 68.0 in | Wt 166.4 lb

## 2016-03-10 DIAGNOSIS — C3491 Malignant neoplasm of unspecified part of right bronchus or lung: Secondary | ICD-10-CM

## 2016-03-10 DIAGNOSIS — H6123 Impacted cerumen, bilateral: Secondary | ICD-10-CM

## 2016-03-10 DIAGNOSIS — J700 Acute pulmonary manifestations due to radiation: Secondary | ICD-10-CM

## 2016-03-10 DIAGNOSIS — E039 Hypothyroidism, unspecified: Secondary | ICD-10-CM | POA: Diagnosis not present

## 2016-03-10 DIAGNOSIS — I480 Paroxysmal atrial fibrillation: Secondary | ICD-10-CM | POA: Diagnosis not present

## 2016-03-10 DIAGNOSIS — I25810 Atherosclerosis of coronary artery bypass graft(s) without angina pectoris: Secondary | ICD-10-CM

## 2016-03-10 NOTE — Progress Notes (Signed)
Date:  03/10/2016   Name:  Ismahan Lippman   DOB:  22-May-1936   MRN:  [id number]   Chief Complaint: Ear Pain; Shortness of Breath; Fatigue; and Cough Shortness of Breath This is a chronic problem. Episode onset: due to radiation pneumonitis  Associated symptoms include ear pain, headaches and wheezing. Pertinent negatives include no abdominal pain, chest pain, fever or leg swelling. Risk factors: Has known radiation-induced pneumonitis followed by pulmonary. Treatments tried: Prednisone dose recently decreased to 5 mg per day. Dulera added to her inhaled regimen.  Cough This is a chronic problem. Associated symptoms include ear pain, headaches, shortness of breath and wheezing. Pertinent negatives include no chest pain or fever.  Otalgia  There is pain in the right ear. This is a new problem. The problem occurs every few minutes. Progression since onset: feels like a bug moving around. Associated symptoms include coughing and headaches. Pertinent negatives include no abdominal pain, ear discharge or hearing loss.  Thyroid Problem Presents for follow-up visit. Symptoms include diaphoresis, fatigue, palpitations and weight gain (due to prednisone). Past treatments include levothyroxine. Improvement on treatment: has not had thyroid checked in several years.      Review of Systems  Constitutional: Positive for weight gain (due to prednisone), diaphoresis, fatigue and unexpected weight change. Negative for fever.  HENT: Positive for ear pain. Negative for ear discharge and hearing loss.   Eyes: Positive for visual disturbance.  Respiratory: Positive for cough, chest tightness, shortness of breath and wheezing.   Cardiovascular: Positive for palpitations. Negative for chest pain and leg swelling.  Gastrointestinal: Negative for abdominal pain.  Skin: Positive for color change.  Neurological: Positive for light-headedness and headaches. Negative for dizziness and syncope.    Psychiatric/Behavioral: Positive for decreased concentration. Negative for dysphoric mood.    Patient Active Problem List   Diagnosis Date Noted  . DM type 2, goal A1c below 7 11/17/2015  . Hx of adenomatous colonic polyps 11/17/2015  . Radiation pneumonitis ([place]) 10/21/2015  . Mass of lower lobe of left lung 10/21/2015  . COPD exacerbation ([place]) 10/06/2015  . Chronic systolic CHF (congestive heart failure) ([place])   . PAF (paroxysmal atrial fibrillation) ([place])   . Mitral regurgitation   . HLD (hyperlipidemia)   . Paroxysmal supraventricular tachycardia ([place])   . Acute respiratory failure with hypoxia and hypercapnia (HCC)   . Coronary artery disease involving coronary bypass graft of native heart without angina pectoris   . NSTEMI (non-ST elevated myocardial infarction) ([place])   . Arteriosclerosis of coronary artery 01/11/2015  . Hypothyroidism (acquired) 01/11/2015  . Carcinoma of right lung ([place]) 01/03/2015  . Disorder of peripheral nervous system ([place]) 10/04/2014  . 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 Cervical radiculopathy, chronic 10/04/2014  . Squamous cell carcinoma of lung ([place]) 10/04/2014  . Malignant neoplasm of unspecified part of right bronchus or lung ([place]) 10/04/2014  . Cough 09/09/2014  . Dyspnea 09/09/2014  . Atrial flutter ([place]) 11/16/2013  . CAFL (chronic airflow limitation) ([place]) 04/24/2012  . Chronic obstructive pulmonary disease ([place]) 04/24/2012  . Myocardial infarction ([place]) 04/24/2012  . Hypertension 08/03/2011  . Artificial cardiac pacemaker 08/03/2011    Prior to Admission medications   Medication Sig Start Date End Date Taking? Authorizing Provider  albuterol (PROVENTIL HFA;VENTOLIN HFA) 108 (90 Base) MCG/ACT inhaler Inhale 2 puffs into the lungs every 6 (six) hours as needed for wheezing or shortness of breath. 02/24/16  Yes Vishal Mungal, MD  apixaban (ELIQUIS) 5 MG  TABS tablet Take 5 mg by mouth 2 (two) times daily.   Yes Historical  Provider, MD  atorvastatin (LIPITOR) 20 MG tablet Take 20 mg by mouth daily at 6 PM.    Yes Historical Provider, MD  azelastine (ASTELIN) 0.1 % nasal spray Place into the nose. 03/19/15  Yes Historical Provider, MD  Cetirizine HCl 10 MG CAPS Take 1 capsule (10 mg total) by mouth 1 day or 1 dose. 05/19/15  Yes Ryan M Dunn, PA-C  ENTRESTO 24-26 MG TAKE 1 TABLET BY MOUTH TWICE A DAY 11/03/15  Yes Minna Merritts, MD  furosemide (LASIX) 20 MG tablet Take 1 tablet (20 mg total) by mouth daily as needed. 02/16/16  Yes Minna Merritts, MD  ipratropium-albuterol (DUONEB) 0.5-2.5 (3) MG/3ML SOLN TAKE 3ML BY NEBULIZER EVERY 6 HOURS AS NEEDED 04/07/15  Yes Historical Provider, MD  levothyroxine (SYNTHROID, LEVOTHROID) 75 MCG tablet Take 75 mcg by mouth daily before breakfast.   Yes Historical Provider, MD  metoprolol succinate (TOPROL-XL) 25 MG 24 hr tablet TAKE 1 TABLET BY MOUTH EVERY MORNING AND 2 TABS IN THE EVENING 12/01/15  Yes Minna Merritts, MD  omeprazole (PRILOSEC) 20 MG capsule Take 1 capsule (20 mg total) by mouth every morning. 11/03/15  Yes Forest Gleason, MD  predniSONE (DELTASONE) 5 MG tablet Take 3tabs ('15mg'$ ) until 01/21/16 starting 01/22/16 take 2 tabs ('10mg'$ ) 01/14/16  Yes Vishal Mungal, MD  vitamin B-12 (CYANOCOBALAMIN) 1000 MCG tablet Take 1,000 mcg by mouth daily.   Yes Historical Provider, MD    Allergies  Allergen Reactions  . Lovenox [Enoxaparin Sodium] Itching  . Meperidine Other (See Comments)    Other Reaction: pt does not like how it makes her feel    Past Surgical History  Procedure Laterality Date  . Appendectomy    . Vaginal hysterectomy      partial - left ovary remains  . Coronary artery bypass graft  09/2000  . Pacemaker insertion  03/2012  . Cardiac catheterization N/A 04/28/2015    Procedure: Left Heart Cath and Coronary Angiography;  Surgeon: Wellington Hampshire, MD;  Location: [place] [facility];  Service: Cardiovascular;  Laterality: N/A;  . Endobronchial ultrasound N/A  10/06/2015    Procedure: ENDOBRONCHIAL ULTRASOUND;  Surgeon: Flora Lipps, MD;  Location: [facility] ORS;  Service: Cardiopulmonary;  Laterality: N/A;  . Electromagnetic navigation brochoscopy N/A 10/06/2015    Procedure: ELECTROMAGNETIC NAVIGATION BRONCHOSCOPY;  Surgeon: Flora Lipps, MD;  Location: [facility] ORS;  Service: Cardiopulmonary;  Laterality: N/A;  . Colonoscopy  12/2009    2 small tubular adenomas    Social History  Substance Use Topics  . Smoking status: Former Smoker -- 2.00 packs/day for 42 years    Types: Cigarettes    Quit date: 08/30/2000  . Smokeless tobacco: None     Comment: quit smoking in 08/28/2000  . Alcohol Use: 6.0 oz/week    10 Glasses of wine per week     Comment: 2 glasses of wine per day     Medication list has been reviewed and updated.   Physical Exam  Constitutional: She is oriented to person, place, and time. She appears well-developed. No distress.  HENT:  Head: Normocephalic and atraumatic.  Nose: Right sinus exhibits no maxillary sinus tenderness and no frontal sinus tenderness. Left sinus exhibits no maxillary sinus tenderness and no frontal sinus tenderness.  Mouth/Throat: Oropharynx is clear and moist. No posterior oropharyngeal erythema.  Excessive cerumen bilaterally - attempted to remove with flushing and curette with  no success.  Cardiovascular: Normal rate and regular rhythm.  Exam reveals distant heart sounds.   Pulmonary/Chest: Effort normal. No respiratory distress. She has decreased breath sounds. She has wheezes.  Musculoskeletal: Normal range of motion.  Neurological: She is alert and oriented to person, place, and time.  Skin: Skin is warm and dry. Ecchymosis noted. No rash noted.  Psychiatric: She has a normal mood and affect. Her behavior is normal. Thought content normal.    BP 116/82 mmHg  Pulse 102  Temp(Src) 98.2 F (36.8 C) (Oral)  Resp 16  Ht '5\' 8"'$  (1.727 m)  Wt 166 lb 6.4 oz (75.479 kg)  BMI 25.31 kg/m2  SpO2  95%  Assessment and Plan: 1. Cerumen impaction, bilateral Recommend ENT evaluation  2. Hypothyroidism (acquired) Continue supplement - TSH  3. PAF (paroxysmal atrial fibrillation) ([place]) Followed by cardiology - on Eliquis  4. Radiation pneumonitis (H[facility]) Slightly worsening DOE Continue prednisone and Dulera  5. Malignant neoplasm of unspecified part of right bronchus or lung ([place]) S/p XRT   Halina Maidens, MD [facility]  03/10/2016

## 2016-03-11 LAB — TSH: TSH: 1.84 u[IU]/mL (ref 0.450–4.500)

## 2016-03-20 ENCOUNTER — Encounter: Payer: Self-pay | Admitting: Internal Medicine

## 2016-03-21 ENCOUNTER — Other Ambulatory Visit: Payer: Self-pay | Admitting: Internal Medicine

## 2016-03-21 MED ORDER — ATORVASTATIN CALCIUM 20 MG PO TABS
20.0000 mg | ORAL_TABLET | Freq: Every day | ORAL | 5 refills | Status: DC
Start: 2016-03-21 — End: 2016-10-26

## 2016-03-22 ENCOUNTER — Ambulatory Visit (INDEPENDENT_AMBULATORY_CARE_PROVIDER_SITE_OTHER): Payer: Medicare Other | Admitting: Internal Medicine

## 2016-03-22 ENCOUNTER — Encounter: Payer: Self-pay | Admitting: Internal Medicine

## 2016-03-22 VITALS — BP 132/66 | HR 97 | Ht 68.0 in | Wt 164.0 lb

## 2016-03-22 DIAGNOSIS — R05 Cough: Secondary | ICD-10-CM

## 2016-03-22 DIAGNOSIS — J700 Acute pulmonary manifestations due to radiation: Secondary | ICD-10-CM | POA: Diagnosis not present

## 2016-03-22 DIAGNOSIS — J438 Other emphysema: Secondary | ICD-10-CM

## 2016-03-22 DIAGNOSIS — I25810 Atherosclerosis of coronary artery bypass graft(s) without angina pectoris: Secondary | ICD-10-CM | POA: Diagnosis not present

## 2016-03-22 DIAGNOSIS — R059 Cough, unspecified: Secondary | ICD-10-CM

## 2016-03-22 DIAGNOSIS — R0600 Dyspnea, unspecified: Secondary | ICD-10-CM | POA: Diagnosis not present

## 2016-03-22 MED ORDER — FLUTICASONE FUROATE-VILANTEROL 100-25 MCG/INH IN AEPB: 1.0000 | INHALATION_SPRAY | Freq: Every day | RESPIRATORY_TRACT | 5 refills | Status: DC

## 2016-03-22 MED ORDER — FLUTICASONE FUROATE-VILANTEROL 100-25 MCG/INH IN AEPB: 1.0000 | INHALATION_SPRAY | Freq: Every day | RESPIRATORY_TRACT | 0 refills | Status: AC

## 2016-03-22 NOTE — Assessment & Plan Note (Signed)
Patient was formerly a nonsmoker, subsequently diagnosed with stage III non-small cell lung cancer. Over the last year has been on intermittent doses of Advair, Spiriva, patient not consistent with use. At this time she does have components of radiation pneumonitis/fibrosis, we'll hold her inhalers due to intermittent use and financial issues. While patient should be on a maintenance inhaler, given her lung findings of continued right hilar enlargement, new left lower lobe mass, suspected radiation pneumonitis, she'll be in a chronic inflammatory state, and will subsequently be placed on prednisone. Had a trial of 1 month of breo with mild improvement, will continue with this.   Plan: -Patient will be on prednisone tapering doses over the next 6-8 weeks, see plan for radiation fibrosis -Rescue inhaler, albuterol, as needed for shortness of breath, wheezing, persistent coughing spells -cont with  Breo 100/25, 1 puff daily gargle and rinse after each use

## 2016-03-22 NOTE — Progress Notes (Signed)
Date: 03/22/2016  MRN# [id number] Jenny Cooper [birth year]  PMD - Dr. Gayland Curry Jenny Cooper is a 80 y.o. old female seen in follow up for new LLL mass.   CC:  Chief Complaint  Patient presents with  . Follow-up    4 wk rov.  Pt states she has not improved since last ov- c/o prod cough with clear mucus, sob with any exertion. Pt given Breo samples last ov, noticed no difference in her breathing.     Synopsis - 80 year old female first evaluated by pulmonary in early 2016 for chronic cough, found to have right hilar mass, biopsy of mass and pathology specimens positive for squamous cell, stage IIIa. Now status post chemoradiation, recently found to have left lower lobe mass, with chronic cough, chronic antibiotics. Biopsy, EBUS, ENB, of left lower lobe mass negative for malignancy  Events since last clinic visit: Patient presents today for follow-up visit of chronic cough, along with postradiation fibrosis. Patient states since her last visit, she is to using Flonase mostly in the morning, she noticed that her cough is worse in the morning and congestion, with improvement during the day. She still has postnasal drip. She has been on a trial of Breo which held her symptom.  She has been using albuterol nebulizer 1 treatment daily for shortness of breath especially with exertion. Overall she does have clinical stability,but not worst.  At last visit her steroids were tapered to '5mg'$  daily, but since then her symptoms have not improved.   PMHX:   Past Medical History:  Diagnosis Date  . Atypical atrial flutter ([place])    a. s/p ablation 07/27/2013 followed by Dr. Rockey Situ  . CAD (coronary artery disease)    a. s/p MI x 2 in 2002 s/p PCI x 2 in 2002; b. s/p 2v CABG 2002; c. stress echo 07/2004 w/ evi of pos & inf infarct & no evi of ischemia; d. 4/08 dipyridamole scan w/ multiple areas of infarct, no ischemia, EF 49%; e. cath 04/28/15 3v CAD, med Rx rec, no targets for revasc,  LM lum irregs, pLAD 30%, 100%, ost-pLCx 60%, [name] 99%, OM2 100%, p-mRCA 90%, [name] 100% L-R collats, VG-mLAD irregs, VG-OM2 oc  . Carcinoma of right lung ([place]) 01/03/2015   a. followed by Dr. Oliva Bustard  . Chronic systolic CHF (congestive heart failure) ([place])    a. echo 03/2015: EF 30-35%, sev ant/inf/pos HK, in mild to mod MR  . COPD (chronic obstructive pulmonary disease) ([place])   . GERD (gastroesophageal reflux disease)   . History of blood clots    12/2001  . History of colonoscopy 2013  . History of mammography, screening 2015  . History of Papanicolaou smear of cervix 2013  . HLD (hyperlipidemia)   . HTN (hypertension)   . Hypothyroidism   . Lung cancer ([place])   . Mitral regurgitation    a. s/p mitral ring placement 09/2000; b. echo 09/2010: EF 50%, inf HK, post AK, mild MR, prosthetic mitral valve ring w/ peak gradient of 10 mmHg; b. echo 2/13: EF 50%, mild MR/TR     . Myocardial infarction ([place])    X 2 (LAST ONE IN 2002)  . Neuropathy ([place])   . Pacemaker    a. MDT 2002; b. generator replacement 2013; c. followed by Dr. Omelia Blackwater, MD  . PAF (paroxysmal atrial fibrillation) [facility])    a. on Eliquis    Surgical Hx:  Past Surgical History:  Procedure Laterality Date  . APPENDECTOMY    .  CARDIAC CATHETERIZATION N/A 04/28/2015   Procedure: Left Heart Cath and Coronary Angiography;  Surgeon: Wellington Hampshire, MD;  Location: [facility];  Service: Cardiovascular;  Laterality: N/A;  . COLONOSCOPY  12/2009   2 small tubular adenomas  . CORONARY ARTERY BYPASS GRAFT  09/2000  . ELECTROMAGNETIC NAVIGATION BROCHOSCOPY N/A 10/06/2015   Procedure: ELECTROMAGNETIC NAVIGATION BRONCHOSCOPY;  Surgeon: Flora Lipps, MD;  Location: [facility];  Service: Cardiopulmonary;  Laterality: N/A;  . ENDOBRONCHIAL ULTRASOUND N/A 10/06/2015   Procedure: ENDOBRONCHIAL ULTRASOUND;  Surgeon: Flora Lipps, MD;  Location: [facility];  Service: Cardiopulmonary;  Laterality: N/A;  . PACEMAKER INSERTION  03/2012  . VAGINAL  HYSTERECTOMY     partial - left ovary remains   Family Hx:  Family History  Problem Relation Age of Onset  . COPD Mother     sister, and brother  . Heart attack Neg Hx   . Stroke Maternal Grandmother   . Hypertension Sister   . Hypertension Brother   . Colon cancer Brother     age 26   Social Hx:   Social History  Substance Use Topics  . Smoking status: Former Smoker    Packs/day: 2.00    Years: 42.00    Types: Cigarettes    Quit date: 08/30/2000  . Smokeless tobacco: Not on file     Comment: quit smoking in 08/28/2000  . Alcohol use 6.0 oz/week    10 Glasses of wine per week     Comment: 2 glasses of wine per day   Medication:   Current Outpatient Rx  . Order #: [id number] Class: Normal  . Order #: [id number] Class: Historical Med  . Order #: [id number] Class: Normal  . Order #: [id number] Class: Normal  . Order #: [id number] Class: Normal  . Order #: [id number] Class: Normal  . Order #: [id number] Class: Historical Med  . Order #: [id number] Class: Normal  . Order #: [id number] Class: Normal  . Order #: [id number] Class: Normal  . Order #: [id number] Class: Historical Med  . Order #: [id number] Class: Sample  . Order #: [id number] Class: Normal      Allergies:  Lovenox [enoxaparin sodium] and Meperidine  Review of Systems: Gen:  Denies  fever, sweats, chills HEENT: Denies blurred vision, double vision, ear pain, eye pain, hearing loss, nose bleeds, sore throat. Mild runny nose Cvc:  No dizziness, chest pain or heaviness Resp:  Sob, mild wheezing, cough - mainly non productive.  Gi: Denies swallowing difficulty, stomach pain, nausea or vomiting, diarrhea, constipation, bowel incontinence Gu:  Denies bladder incontinence, burning urine Ext:   No Joint pain, stiffness or swelling Skin: No skin rash, easy bruising or bleeding or hives Endoc:  No polyuria, polydipsia , polyphagia or weight change Psych: No depression, insomnia or hallucinations  Other:  Right hand and foot  numbness  Physical Examination:   VS: BP 132/66 (BP Location: Left Arm, Cuff Size: Normal)   Pulse 97   Ht '5\' 8"'$  (1.727 m)   Wt 164 lb (74.4 kg)   SpO2 96%   BMI 24.94 kg/m   General Appearance: No distress  Neuro:without focal findings, mental status, speech normal, alert and oriented, cranial nerves 2-12 intact, reflexes normal and symmetric, sensation grossly normal - right/left hand - sensation intact, strength 5/5 HEENT: PERRLA, EOM intact, no ptosis, no other lesions noticed; Mallampati 2 Pulmonary: coarse upper airway sounds, dec BS at the left bases, good airway movement in the RUL, diaphragmatic excursion normal.slight R sided exp wheezes (baseline),    Sputum Production:  none CardiovascularNormal  S1,S2.  No m/r/g.  Abdominal aorta pulsation normal.    Abdomen: Benign, Soft, non-tender, No masses, hepatosplenomegaly, No lymphadenopathy Renal:  No costovertebral tenderness  GU:  No performed at this time. Endoc: No evident thyromegaly, no signs of acromegaly or Cushing features Skin:   warm, no rashes, no ecchymosis  Extremities: normal, no cyanosis, clubbing, no edema, warm with normal capillary refill. Other findings:none   Rad results: (The following images and results were reviewed by Dr. Stevenson Clinch on 03/22/2016). CXR 01/19/16  FINDINGS: Port-A-Cath noted with tip projected over the superior vena cava. Cardiac pacer with lead tips over the right atrium right ventricle. Prior CABG and cardiac valve replacement. Cardiomegaly with normal pulmonary vascularity. Right perihilar interstitial infiltrate/radiation changes have improved.  IMPRESSION: 1. Port-A-Cath in stable position. Cardiac pacer stable position. Prior CABG and cardiac valve replacement. Mild cardiomegaly .  2. Right perihilar interstitial changes consistent with radiation therapy have improved from prior exam. No new infiltrate identified. No focal lesion identified.   ECHO 03/2014 MILD LV DYSFUNCTION   NORMAL RIGHT VENTRICULAR SYSTOLIC FUNCTION VALVULAR REGURGITATION: MILD MR, TRIVIAL PR, MILD TR PROSTHETIC VALVE(S): PROSTHETIC MV RING    Assessment and Plan: 80 year old female past medical history of chronic cough, COPD, stage IIIa non-small cell lung cancer, status post chemoradiation, seen in follow-up visit for recurrent cough, suspected postradiation pneumonitis/fibrosis.     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  Had a mild exacerbation, but is now improving.  Advised to continue with allergy regiment - restart nasal saline rinses, or flonase at night  Plan: -Treat radiation pneumonitis/fibrosis with prednisone - cont nasal saline rinses (2-3 times per week), or flonase at night time -Cont Breo 100/25, 1 puff daily gargle and rinse after each use          Radiation pneumonitis [facility]) CT images reviewed, there are right upper lobe changes, that has a temporal relationship to recent radiation which have now stayed the same, but with architectural distortion in the left lung Her last radiation dose was August 2016, since then has required 3 subsequent prednisone tapers. Current CT chest reviewed with patient. 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  Left with nodular and mass-like areas of distortion, which is improved from Feb 2016 CT chest scan.  New basilar GGO areas in the left lung. Will continue with supportive care. Patient has had 3 CT scan in the last 6 months, with gradual improvement, therefore will follow up with CXRs. Today with mild exacerbation of cough and radiation pneumonitis  Plan: -Prednisone 10 mg daily for the next 1 month, then start 5 mg  daily until follow up visit - keep CT Chest appt in Sept - cont with nasal saline rinses, breo, and PRN albuterol           Chronic obstructive pulmonary disease ([place]) Patient was formerly a nonsmoker, subsequently diagnosed with stage III non-small cell lung cancer. Over the last year has been on intermittent doses of Advair, Spiriva, patient not consistent with use. At this time she does have components of radiation pneumonitis/fibrosis, we'll hold her inhalers due to intermittent use and financial issues. While patient should be on a maintenance inhaler, given her lung findings of continued right hilar enlargement, new left lower lobe mass, suspected radiation  pneumonitis, she'll be in a chronic inflammatory state, and will subsequently be placed on prednisone. Had a trial of 1 month of breo with mild improvement, will continue with this.   Plan: -Patient will be on prednisone tapering doses over the next 6-8 weeks, see plan for radiation fibrosis -Rescue inhaler, albuterol, as needed for shortness of breath, wheezing, persistent coughing spells -cont with  Breo 100/25, 1 puff daily gargle and rinse after each use     Updated Medication List Outpatient Encounter Prescriptions as of 03/22/2016  Medication Sig  . albuterol (PROVENTIL HFA;VENTOLIN HFA) 108 (90 Base) MCG/ACT inhaler Inhale 2 puffs into the lungs every 6 (six) hours as needed for wheezing or shortness of breath.  Marland Kitchen apixaban (ELIQUIS) 5 MG TABS tablet Take 5 mg by mouth 2 (two) times daily.  Marland Kitchen atorvastatin (LIPITOR) 20 MG tablet Take 1 tablet (20 mg total) by mouth at bedtime.  . Cetirizine HCl 10 MG CAPS Take 1 capsule (10 mg total) by mouth 1 day or 1 dose.  Marland Kitchen ENTRESTO 24-26 MG TAKE 1 TABLET BY MOUTH TWICE A DAY  . fluticasone furoate-vilanterol (BREO ELLIPTA) 100-25 MCG/INH AEPB Inhale 1 puff into the lungs daily.  Marland Kitchen levothyroxine (SYNTHROID, LEVOTHROID) 75 MCG tablet Take 75 mcg by mouth daily before breakfast.  .  metoprolol succinate (TOPROL-XL) 25 MG 24 hr tablet TAKE 1 TABLET BY MOUTH EVERY MORNING AND 2 TABS IN THE EVENING  . omeprazole (PRILOSEC) 20 MG capsule Take 1 capsule (20 mg total) by mouth every morning.  . predniSONE (DELTASONE) 5 MG tablet Take 3tabs ('15mg'$ ) until 01/21/16 starting 01/22/16 take 2 tabs ('10mg'$ ) (Patient taking differently: Take 5 mg by mouth daily with breakfast. )  . vitamin B-12 (CYANOCOBALAMIN) 1000 MCG tablet Take 1,000 mcg by mouth daily.  . [DISCONTINUED] fluticasone furoate-vilanterol (BREO ELLIPTA) 100-25 MCG/INH AEPB Inhale 1 puff into the lungs daily.  . fluticasone furoate-vilanterol (BREO ELLIPTA) 100-25 MCG/INH AEPB Inhale 1 puff into the lungs daily.  . furosemide (LASIX) 20 MG tablet Take 1 tablet (20 mg total) by mouth daily as needed. (Patient not taking: Reported on 03/22/2016)  . [DISCONTINUED] azelastine (ASTELIN) 0.1 % nasal spray Place into the nose.  . [DISCONTINUED] ipratropium-albuterol (DUONEB) 0.5-2.5 (3) MG/3ML SOLN TAKE 3ML BY NEBULIZER EVERY 6 HOURS AS NEEDED   Facility-Administered Encounter Medications as of 03/22/2016  Medication  . sodium chloride 0.9 % injection 10 mL  . sodium chloride flush (NS) 0.9 % injection 10 mL    Orders for this visit: Orders Placed This Encounter  Procedures  . Spirometry with Graph    Standing Status:   Future    Standing Expiration Date:   03/22/2017    Order Specific Question:   Where should this test be performed?    Answer:   [facility]  . Spirometry with Graph    Order Specific Question:   Where should this test be performed?    Answer:   [facility]     Thank  you for the consultation and for allowing [facility] to assist in the care of your patient. Our recommendations are noted above.  Please contact us if we can be of further service.   Vilinda Boehringer, MD [facility] Office Number: [phone]

## 2016-03-22 NOTE — Patient Instructions (Signed)
Follow up with Dr. Stevenson Clinch in:2 months after scheduled CT chest scan -Prednisone 10 mg daily for the next 1 month, then start 5 mg daily until follow up visit - keep CT Chest appt in Sept - cont with nasal saline rinses, breo, and PRN albuterol

## 2016-03-22 NOTE — Assessment & Plan Note (Signed)
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  Had a mild exacerbation, but is now improving.  Advised to continue with allergy regiment - restart nasal saline rinses, or flonase at night  Plan: -Treat radiation pneumonitis/fibrosis with prednisone - cont nasal saline rinses (2-3 times per week), or flonase at night time -Cont Breo 100/25, 1 puff daily gargle and rinse after each use

## 2016-03-22 NOTE — Assessment & Plan Note (Signed)
CT images reviewed, there are right upper lobe changes, that has a temporal relationship to recent radiation which have now stayed the same, but with architectural distortion in the left lung Her last radiation dose was August 2016, since then has required 3 subsequent prednisone tapers. Current CT chest reviewed with patient. 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  Left with nodular and mass-like areas of distortion, which is improved from Feb 2016 CT chest scan.  New basilar GGO areas in the left lung. Will continue with supportive care. Patient has had 3 CT scan in the last 6 months, with gradual improvement, therefore will follow up with CXRs. Today with mild exacerbation of cough and radiation pneumonitis  Plan: -Prednisone 10 mg daily for the next 1 month, then start 5 mg daily until follow up visit - keep CT Chest appt in Sept - cont with nasal saline rinses, breo, and PRN albuterol

## 2016-03-29 ENCOUNTER — Other Ambulatory Visit: Payer: Self-pay | Admitting: Cardiovascular Disease

## 2016-04-05 ENCOUNTER — Other Ambulatory Visit: Payer: Self-pay | Admitting: *Deleted

## 2016-04-05 MED ORDER — PREDNISONE 5 MG PO TABS: ORAL_TABLET | ORAL | 0 refills | Status: DC

## 2016-04-12 ENCOUNTER — Inpatient Hospital Stay: Payer: Medicare Other | Attending: Internal Medicine

## 2016-04-12 DIAGNOSIS — Z923 Personal history of irradiation: Secondary | ICD-10-CM | POA: Insufficient documentation

## 2016-04-12 DIAGNOSIS — Z9221 Personal history of antineoplastic chemotherapy: Secondary | ICD-10-CM | POA: Insufficient documentation

## 2016-04-12 DIAGNOSIS — C771 Secondary and unspecified malignant neoplasm of intrathoracic lymph nodes: Secondary | ICD-10-CM | POA: Diagnosis not present

## 2016-04-12 DIAGNOSIS — C3401 Malignant neoplasm of right main bronchus: Secondary | ICD-10-CM | POA: Insufficient documentation

## 2016-04-12 DIAGNOSIS — Z95828 Presence of other vascular implants and grafts: Secondary | ICD-10-CM

## 2016-04-12 MED ORDER — SODIUM CHLORIDE 0.9% FLUSH
10.0000 mL | INTRAVENOUS | Status: DC | PRN
  Administered 2016-04-12: 10 mL via INTRAVENOUS
  Filled 2016-04-12: qty 10

## 2016-04-12 MED ORDER — HEPARIN SOD (PORK) LOCK FLUSH 100 UNIT/ML IV SOLN
500.0000 [IU] | Freq: Once | INTRAVENOUS | Status: AC
  Administered 2016-04-12: 500 [IU] via INTRAVENOUS

## 2016-04-13 ENCOUNTER — Inpatient Hospital Stay: Payer: Medicare Other

## 2016-04-29 DIAGNOSIS — I429 Cardiomyopathy, unspecified: Secondary | ICD-10-CM | POA: Insufficient documentation

## 2016-04-30 HISTORY — PX: ABLATION: SHX5711

## 2016-05-01 ENCOUNTER — Other Ambulatory Visit: Payer: Self-pay | Admitting: Cardiovascular Disease

## 2016-05-10 DIAGNOSIS — I4891 Unspecified atrial fibrillation: Secondary | ICD-10-CM | POA: Insufficient documentation

## 2016-05-10 HISTORY — PX: CARDIAC ELECTROPHYSIOLOGY STUDY AND ABLATION: SHX1294

## 2016-05-17 ENCOUNTER — Encounter: Payer: Self-pay | Admitting: Internal Medicine

## 2016-05-18 ENCOUNTER — Telehealth: Payer: Self-pay | Admitting: Internal Medicine

## 2016-05-18 NOTE — Telephone Encounter (Signed)
PATIENT REQUESTED A COPY OF RELEASE FORM BE SENT TO HER VIA EMAIL .

## 2016-05-24 ENCOUNTER — Other Ambulatory Visit: Payer: Self-pay

## 2016-05-24 ENCOUNTER — Ambulatory Visit: Payer: Medicare Other

## 2016-05-24 DIAGNOSIS — C3490 Malignant neoplasm of unspecified part of unspecified bronchus or lung: Secondary | ICD-10-CM

## 2016-05-25 ENCOUNTER — Inpatient Hospital Stay: Payer: Medicare Other | Attending: Internal Medicine | Admitting: Internal Medicine

## 2016-05-25 ENCOUNTER — Inpatient Hospital Stay: Payer: Medicare Other

## 2016-05-25 ENCOUNTER — Encounter: Payer: Self-pay | Admitting: Internal Medicine

## 2016-05-25 DIAGNOSIS — K219 Gastro-esophageal reflux disease without esophagitis: Secondary | ICD-10-CM | POA: Insufficient documentation

## 2016-05-25 DIAGNOSIS — Z85118 Personal history of other malignant neoplasm of bronchus and lung: Secondary | ICD-10-CM | POA: Insufficient documentation

## 2016-05-25 DIAGNOSIS — Z800 Family history of malignant neoplasm of digestive organs: Secondary | ICD-10-CM | POA: Insufficient documentation

## 2016-05-25 DIAGNOSIS — Y842 Radiological procedure and radiotherapy as the cause of abnormal reaction of the patient, or of later complication, without mention of misadventure at the time of the procedure: Secondary | ICD-10-CM | POA: Diagnosis not present

## 2016-05-25 DIAGNOSIS — R05 Cough: Secondary | ICD-10-CM | POA: Insufficient documentation

## 2016-05-25 DIAGNOSIS — Z7952 Long term (current) use of systemic steroids: Secondary | ICD-10-CM | POA: Diagnosis not present

## 2016-05-25 DIAGNOSIS — Z9221 Personal history of antineoplastic chemotherapy: Secondary | ICD-10-CM

## 2016-05-25 DIAGNOSIS — I110 Hypertensive heart disease with heart failure: Secondary | ICD-10-CM | POA: Diagnosis not present

## 2016-05-25 DIAGNOSIS — Z923 Personal history of irradiation: Secondary | ICD-10-CM | POA: Insufficient documentation

## 2016-05-25 DIAGNOSIS — Z87891 Personal history of nicotine dependence: Secondary | ICD-10-CM | POA: Diagnosis not present

## 2016-05-25 DIAGNOSIS — I509 Heart failure, unspecified: Secondary | ICD-10-CM | POA: Insufficient documentation

## 2016-05-25 DIAGNOSIS — Z951 Presence of aortocoronary bypass graft: Secondary | ICD-10-CM | POA: Diagnosis not present

## 2016-05-25 DIAGNOSIS — I252 Old myocardial infarction: Secondary | ICD-10-CM | POA: Diagnosis not present

## 2016-05-25 DIAGNOSIS — Z79899 Other long term (current) drug therapy: Secondary | ICD-10-CM | POA: Insufficient documentation

## 2016-05-25 DIAGNOSIS — Z7901 Long term (current) use of anticoagulants: Secondary | ICD-10-CM | POA: Insufficient documentation

## 2016-05-25 DIAGNOSIS — I484 Atypical atrial flutter: Secondary | ICD-10-CM | POA: Diagnosis not present

## 2016-05-25 DIAGNOSIS — I480 Paroxysmal atrial fibrillation: Secondary | ICD-10-CM | POA: Insufficient documentation

## 2016-05-25 DIAGNOSIS — J700 Acute pulmonary manifestations due to radiation: Secondary | ICD-10-CM | POA: Diagnosis not present

## 2016-05-25 DIAGNOSIS — I2510 Atherosclerotic heart disease of native coronary artery without angina pectoris: Secondary | ICD-10-CM | POA: Diagnosis not present

## 2016-05-25 DIAGNOSIS — Z950 Presence of cardiac pacemaker: Secondary | ICD-10-CM | POA: Diagnosis not present

## 2016-05-25 DIAGNOSIS — E039 Hypothyroidism, unspecified: Secondary | ICD-10-CM | POA: Insufficient documentation

## 2016-05-25 DIAGNOSIS — E785 Hyperlipidemia, unspecified: Secondary | ICD-10-CM | POA: Diagnosis not present

## 2016-05-25 DIAGNOSIS — C3490 Malignant neoplasm of unspecified part of unspecified bronchus or lung: Secondary | ICD-10-CM

## 2016-05-25 DIAGNOSIS — C3402 Malignant neoplasm of left main bronchus: Secondary | ICD-10-CM | POA: Diagnosis present

## 2016-05-25 DIAGNOSIS — J449 Chronic obstructive pulmonary disease, unspecified: Secondary | ICD-10-CM | POA: Insufficient documentation

## 2016-05-25 DIAGNOSIS — G629 Polyneuropathy, unspecified: Secondary | ICD-10-CM | POA: Insufficient documentation

## 2016-05-25 LAB — COMPREHENSIVE METABOLIC PANEL WITH GFR
ALT: 24 U/L (ref 14–54)
AST: 24 U/L (ref 15–41)
Albumin: 3.9 g/dL (ref 3.5–5.0)
Alkaline Phosphatase: 126 U/L (ref 38–126)
Anion gap: 10 (ref 5–15)
BUN: 23 mg/dL — ABNORMAL HIGH (ref 6–20)
CO2: 28 mmol/L (ref 22–32)
Calcium: 9.4 mg/dL (ref 8.9–10.3)
Chloride: 97 mmol/L — ABNORMAL LOW (ref 101–111)
Creatinine, Ser: 1.14 mg/dL — ABNORMAL HIGH (ref 0.44–1.00)
GFR calc Af Amer: 52 mL/min — ABNORMAL LOW
GFR calc non Af Amer: 45 mL/min — ABNORMAL LOW
Glucose, Bld: 126 mg/dL — ABNORMAL HIGH (ref 65–99)
Potassium: 4.6 mmol/L (ref 3.5–5.1)
Sodium: 135 mmol/L (ref 135–145)
Total Bilirubin: 0.9 mg/dL (ref 0.3–1.2)
Total Protein: 7.6 g/dL (ref 6.5–8.1)

## 2016-05-25 LAB — CBC WITH DIFFERENTIAL/PLATELET
BASOS ABS: 0 10*3/uL (ref 0–0.1)
BASOS PCT: 1 %
Eosinophils Absolute: 0.2 10*3/uL (ref 0–0.7)
Eosinophils Relative: 2 %
HEMATOCRIT: 43.3 % (ref 35.0–47.0)
HEMOGLOBIN: 14.1 g/dL (ref 12.0–16.0)
Lymphocytes Relative: 17 %
Lymphs Abs: 1.2 10*3/uL (ref 1.0–3.6)
MCH: 29.6 pg (ref 26.0–34.0)
MCHC: 32.4 g/dL (ref 32.0–36.0)
MCV: 91.2 fL (ref 80.0–100.0)
MONOS PCT: 11 %
Monocytes Absolute: 0.8 10*3/uL (ref 0.2–0.9)
NEUTROS ABS: 4.6 10*3/uL (ref 1.4–6.5)
NEUTROS PCT: 69 %
Platelets: 236 10*3/uL (ref 150–440)
RBC: 4.75 MIL/uL (ref 3.80–5.20)
RDW: 15.9 % — ABNORMAL HIGH (ref 11.5–14.5)
WBC: 6.7 10*3/uL (ref 3.6–11.0)

## 2016-05-25 LAB — MAGNESIUM: Magnesium: 2.1 mg/dL (ref 1.7–2.4)

## 2016-05-25 NOTE — Progress Notes (Signed)
Cardiac ablation Sept 11th.  CT scan prior to ablation.  No concerns.

## 2016-05-25 NOTE — Assessment & Plan Note (Addendum)
Stage III lung cancer s/p chemo-RT- 2016. Status post recent bronchoscopy in February 2017-negative for malignancy. Most recent CT scan- September 7 at [facility]- hilar mass noted; with ground glass opacities.  # ? Radiation pneumonitis/COPD- currently on prednisone. Follows up with dr.Mungal.   # Repeat CT scan in 75month/ labs- MD follow up.

## 2016-05-25 NOTE — Progress Notes (Signed)
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 Biopsy from hilar area and lymph node station 4R was positive for squamous cell carcinoma.  Patient had compression of the right mainstem bronchus because of enlarged lymph node and a mass; [ T4 N1 M0 tumor stage IIIa ].  2. Started on radiation and chemotherapy from October 21, 2014 3. Finished 6 cycles of carboplatinum and Taxol  in March  29 th of 2016,  PET scan shows significant response  4. Started on consolidation chemotherapy.  Patient finished 2 cycles on July 2016 of carboplatin and Taxol. 5. Atrial fibrillation diagnosis in August of 2016 on eloquis 6. Repeat bronchoscopy was negative for any malignancy  In February 2017.  # SEP 7th 2017- CT [facility]- 5cm hilar mass; bil Ground glass opacities.   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 I reviewed the patient's prior charts/pertinent labs/imaging in detail; findings are summarized above.     INTERVAL HISTORY:  Jenny Cooper 80 y.o.  female pleasant patient above history of Stage III squamous cell lung cancer status post chemoradiation approximately 1 year ago is here for follow-up.  Patient interim had a cardiac catheter; Pacemaker placement at [facility].  Patient has chronic cough-  attributed to pneumonitis from radiation/ currently on prednisone. She follows up with pulmonary; has a follow-up appointment tomorrow. Her last CT scan was on September 7th [facility]. Patient is chronic mild cough. Chronic mild shortness of breath. Not any worse.  REVIEW OF SYSTEMS:  A complete 10 point review of system is done which is negative except mentioned above/history of present illness.   PAST MEDICAL HISTORY :  Past Medical History:  Diagnosis Date  . Atypical atrial flutter ([place])    a. s/p ablation 07/27/2013 followed by Dr. Rockey Situ  . CAD (coronary artery disease)    a. s/p MI x 2 in 2002 s/p PCI x 2 in 2002; b. s/p 2v CABG 2002; c. stress echo 07/2004 w/ evi of pos & inf infarct & no evi of ischemia; d. 4/08 dipyridamole scan w/ multiple areas of infarct, no ischemia, EF 49%; e. cath 04/28/15 3v CAD, med Rx rec, no targets for revasc, LM lum irregs, pLAD 30%, 100%, ost-pLCx 60%, [name] 99%, OM2 100%, p-mRCA 90%, [name] 100% L-R collats, VG-mLAD irregs, VG-OM2 oc  . Carcinoma of right lung ([place]) 01/03/2015   a. followed by Dr. Oliva Bustard  . Chronic systolic CHF (congestive heart failure) ([place])    a. echo 03/2015: EF 30-35%, sev ant/inf/pos HK, in mild to mod MR  . COPD (chronic obstructive pulmonary disease) ([place])   . GERD (gastroesophageal reflux disease)   . History of blood clots    12/2001  . History of colonoscopy 2013  .  History of mammography, screening 2015  . History of Papanicolaou smear of cervix 2013  . HLD (hyperlipidemia)   . HTN (hypertension)   . Hypothyroidism   . Lung cancer ([place])   . Mitral regurgitation    a. s/p mitral ring placement 09/2000; b. echo 09/2010: EF 50%, inf HK, post AK, mild MR, prosthetic mitral valve ring w/ peak gradient of 10 mmHg; b. echo 2/13: EF 50%, mild MR/TR     . Myocardial infarction ([place])    X 2 (LAST ONE IN 2002)  . Neuropathy ([place])   . Pacemaker    a. MDT 2002; b. generator replacement 2013; c. followed by Dr. Omelia Blackwater, MD  . PAF  (paroxysmal atrial fibrillation) [facility])    a. on Eliquis     PAST SURGICAL HISTORY :   Past Surgical History:  Procedure Laterality Date  . APPENDECTOMY    . CARDIAC CATHETERIZATION N/A 04/28/2015   Procedure: Left Heart Cath and Coronary Angiography;  Surgeon: Wellington Hampshire, MD;  Location: [place] [facility];  Service: Cardiovascular;  Laterality: N/A;  . COLONOSCOPY  12/2009   2 small tubular adenomas  . CORONARY ARTERY BYPASS GRAFT  09/2000  . ELECTROMAGNETIC NAVIGATION BROCHOSCOPY N/A 10/06/2015   Procedure: ELECTROMAGNETIC NAVIGATION BRONCHOSCOPY;  Surgeon: Flora Lipps, MD;  Location: [facility] ORS;  Service: Cardiopulmonary;  Laterality: N/A;  . ENDOBRONCHIAL ULTRASOUND N/A 10/06/2015   Procedure: ENDOBRONCHIAL ULTRASOUND;  Surgeon: Flora Lipps, MD;  Location: [facility] ORS;  Service: Cardiopulmonary;  Laterality: N/A;  . PACEMAKER INSERTION  03/2012  . VAGINAL HYSTERECTOMY     partial - left ovary remains    FAMILY HISTORY :   Family History  Problem Relation Age of Onset  . COPD Mother     sister, and brother  . Stroke Maternal Grandmother   . Hypertension Sister   . Hypertension Brother   . Colon cancer Brother     age 59  . Heart attack Neg Hx     SOCIAL HISTORY:   Social History  Substance Use Topics  . Smoking status: Former Smoker    Packs/day: 2.00    Years: 42.00    Types: Cigarettes    Quit date: 08/30/2000  . Smokeless tobacco: Never Used     Comment: quit smoking in 08/28/2000  . Alcohol use 6.0 oz/week    10 Glasses of wine per week     Comment: 2 glasses of wine per day    ALLERGIES:  is allergic to lovenox [enoxaparin sodium] and meperidine.  MEDICATIONS:  Current Outpatient Prescriptions  Medication Sig Dispense Refill  . albuterol (PROVENTIL HFA;VENTOLIN HFA) 108 (90 Base) MCG/ACT inhaler Inhale 2 puffs into the lungs every 6 (six) hours as needed for wheezing or shortness of breath. 1 Inhaler 2  . apixaban (ELIQUIS) 5 MG TABS tablet Take 5 mg by mouth 2  (two) times daily.    Marland Kitchen atorvastatin (LIPITOR) 20 MG tablet Take 1 tablet (20 mg total) by mouth at bedtime. 30 tablet 5  . Cetirizine HCl 10 MG CAPS Take 1 capsule (10 mg total) by mouth 1 day or 1 dose. 30 capsule 5  . ENTRESTO 24-26 MG TAKE 1 TABLET BY MOUTH TWICE A DAY 60 tablet 3  . fluticasone furoate-vilanterol (BREO ELLIPTA) 100-25 MCG/INH AEPB Inhale 1 puff into the lungs daily. 60 each 5  . furosemide (LASIX) 20 MG tablet Take 1 tablet (20 mg total) by mouth daily as needed. 30 tablet 3  . levothyroxine (SYNTHROID, LEVOTHROID) 75  MCG tablet Take 75 mcg by mouth daily before breakfast.    . metoprolol succinate (TOPROL-XL) 25 MG 24 hr tablet TAKE 1 TABLET BY MOUTH EVERY MORNING AND 2 TABS IN THE EVENING 90 tablet 3  . omeprazole (PRILOSEC) 20 MG capsule Take 1 capsule (20 mg total) by mouth every morning. 30 capsule 6  . predniSONE (DELTASONE) 5 MG tablet Take 2 tabs ('10mg'$ ) daily until 04/22/16 then take 1 tab ('5mg'$ ) until followup appointment 100 tablet 0  . vitamin B-12 (CYANOCOBALAMIN) 1000 MCG tablet Take 1,000 mcg by mouth daily.     No current facility-administered medications for this visit.    Facility-Administered Medications Ordered in Other Visits  Medication Dose Route Frequency Provider Last Rate Last Dose  . sodium chloride 0.9 % injection 10 mL  10 mL Intravenous PRN Forest Gleason, MD   10 mL at 02/19/15 1000  . sodium chloride flush (NS) 0.9 % injection 10 mL  10 mL Intravenous PRN Cammie Sickle, MD   10 mL at 02/16/16 1048    PHYSICAL EXAMINATION: ECOG PERFORMANCE STATUS: 0 - Asymptomatic  BP 126/88 (BP Location: Right Arm, Patient Position: Sitting)   Pulse 83   Temp (!) 95.3 F (35.2 C) (Tympanic)   Resp (!) 22   Ht '5\' 8"'$  (1.727 m)   Wt 162 lb 7.7 oz (73.7 kg)   BMI 24.70 kg/m   Filed Weights   05/25/16 1003  Weight: 162 lb 7.7 oz (73.7 kg)    GENERAL: Well-nourished well-developed; Alert, no distress and comfortable.   Alone.  EYES: no pallor or  icterus OROPHARYNX: no thrush or ulceration; good dentition  NECK: supple, no masses felt LYMPH:  no palpable lymphadenopathy in the cervical, axillary or inguinal regions LUNGS: Decreased breath sounds to auscultation and  No wheeze or crackles HEART/CVS: regular rate & rhythm and no murmurs; No lower extremity edema ABDOMEN:abdomen soft, non-tender and normal bowel sounds Musculoskeletal:no cyanosis of digits and no clubbing  PSYCH: alert & oriented x 3 with fluent speech NEURO: no focal motor/sensory deficits SKIN:  no rashes or significant lesions  LABORATORY DATA:  I have reviewed the data as listed    Component Value Date/Time   NA 135 05/25/2016 0951   NA 139 11/17/2015 1526   NA 136 12/24/2014 1457   K 4.6 05/25/2016 0951   K 3.6 12/24/2014 1457   CL 97 (L) 05/25/2016 0951   CL 98 (L) 12/24/2014 1457   CO2 28 05/25/2016 0951   CO2 32 12/24/2014 1457   GLUCOSE 126 (H) 05/25/2016 0951   GLUCOSE 107 (H) 12/24/2014 1457   BUN 23 (H) 05/25/2016 0951   BUN 30 (H) 11/17/2015 1526   BUN 17 12/24/2014 1457   CREATININE 1.14 (H) 05/25/2016 0951   CREATININE 1.00 12/24/2014 1457   CALCIUM 9.4 05/25/2016 0951   CALCIUM 9.5 12/24/2014 1457   PROT 7.6 05/25/2016 0951   PROT 7.4 12/24/2014 1457   ALBUMIN 3.9 05/25/2016 0951   ALBUMIN 3.9 12/24/2014 1457   AST 24 05/25/2016 0951   AST 22 12/24/2014 1457   ALT 24 05/25/2016 0951   ALT 19 12/24/2014 1457   ALKPHOS 126 05/25/2016 0951   ALKPHOS 65 12/24/2014 1457   BILITOT 0.9 05/25/2016 0951   BILITOT 0.4 12/24/2014 1457   GFRNONAA 45 (L) 05/25/2016 0951   GFRNONAA 55 (L) 12/24/2014 1457   GFRAA 52 (L) 05/25/2016 0951   GFRAA >60 12/24/2014 1457    No results found for: SPEP, UPEP  Lab Results  Component Value Date   WBC 6.7 05/25/2016   NEUTROABS 4.6 05/25/2016   HGB 14.1 05/25/2016   HCT 43.3 05/25/2016   MCV 91.2 05/25/2016   PLT 236 05/25/2016      Chemistry      Component Value Date/Time   NA 135  05/25/2016 0951   NA 139 11/17/2015 1526   NA 136 12/24/2014 1457   K 4.6 05/25/2016 0951   K 3.6 12/24/2014 1457   CL 97 (L) 05/25/2016 0951   CL 98 (L) 12/24/2014 1457   CO2 28 05/25/2016 0951   CO2 32 12/24/2014 1457   BUN 23 (H) 05/25/2016 0951   BUN 30 (H) 11/17/2015 1526   BUN 17 12/24/2014 1457   CREATININE 1.14 (H) 05/25/2016 0951   CREATININE 1.00 12/24/2014 1457      Component Value Date/Time   CALCIUM 9.4 05/25/2016 0951   CALCIUM 9.5 12/24/2014 1457   ALKPHOS 126 05/25/2016 0951   ALKPHOS 65 12/24/2014 1457   AST 24 05/25/2016 0951   AST 22 12/24/2014 1457   ALT 24 05/25/2016 0951   ALT 19 12/24/2014 1457   BILITOT 0.9 05/25/2016 0951   BILITOT 0.4 12/24/2014 1457       RADIOGRAPHIC STUDIES: I have personally reviewed the radiological images as listed and agreed with the findings in the report. No results found.   ASSESSMENT & PLAN:  Cancer of hilus of left lung ([place]) Stage III lung cancer s/p chemo-RT- 2016. Status post recent bronchoscopy in February 2017-negative for malignancy. Most recent CT scan- September 7 at [facility]- hilar mass noted; with ground glass opacities.  # ? Radiation pneumonitis/COPD- currently on prednisone. Follows up with dr.Mungal.   # Repeat CT scan in 90month/ labs- MD follow up.    Orders Placed This Encounter  Procedures  . CT CHEST W CONTRAST    Standing Status:   Future    Standing Expiration Date:   07/25/2017    Order Specific Question:   Reason for Exam (SYMPTOM  OR DIAGNOSIS REQUIRED)    Answer:   lung cancer s/p chemo-RT    Order Specific Question:   Preferred imaging location?    Answer:   [facility]  . CBC with Differential    Standing Status:   Future    Standing Expiration Date:   05/25/2017  . Comprehensive metabolic panel    Standing Status:   Future    Standing Expiration Date:   05/25/2017   All questions were answered. The patient knows to call the clinic with any problems, questions or concerns.       GCammie Sickle MD 05/25/2016 1:50 PM

## 2016-05-26 ENCOUNTER — Encounter: Payer: Self-pay | Admitting: Internal Medicine

## 2016-05-26 ENCOUNTER — Ambulatory Visit (INDEPENDENT_AMBULATORY_CARE_PROVIDER_SITE_OTHER): Payer: Medicare Other | Admitting: Internal Medicine

## 2016-05-26 VITALS — BP 130/82 | HR 100 | Ht 68.0 in | Wt 162.0 lb

## 2016-05-26 DIAGNOSIS — R05 Cough: Secondary | ICD-10-CM

## 2016-05-26 DIAGNOSIS — I25810 Atherosclerosis of coronary artery bypass graft(s) without angina pectoris: Secondary | ICD-10-CM | POA: Diagnosis not present

## 2016-05-26 DIAGNOSIS — J700 Acute pulmonary manifestations due to radiation: Secondary | ICD-10-CM

## 2016-05-26 DIAGNOSIS — S329XXA Fracture of unspecified parts of lumbosacral spine and pelvis, initial encounter for closed fracture: Secondary | ICD-10-CM

## 2016-05-26 DIAGNOSIS — R059 Cough, unspecified: Secondary | ICD-10-CM

## 2016-05-26 HISTORY — DX: Fracture of unspecified parts of lumbosacral spine and pelvis, initial encounter for closed fracture: S32.9XXA

## 2016-05-26 MED ORDER — PREDNISONE 10 MG PO TABS: 10.0000 mg | ORAL_TABLET | Freq: Every day | ORAL | 0 refills | Status: DC

## 2016-05-26 MED ORDER — PREDNISONE 5 MG PO TABS: 5.0000 mg | ORAL_TABLET | Freq: Every day | ORAL | 0 refills | Status: DC

## 2016-05-26 NOTE — Patient Instructions (Signed)
Follow up with Dr. Stevenson Clinch in:3 months - hold Breo for the next 1-2 months, if symptoms return then restart breo - Prednisone 10 mg, 1 tab po daily with breakfast starting October 1st to October 31.  Starting Nov 1st, take prednisone '5mg'$  daily with breakfast until follow up visit. - flonase 1 actuation to each nostril as needed BID for nasal drainage. Do not use for more than 3 consecutive days.  - cont with exercise as tolerated

## 2016-05-26 NOTE — Assessment & Plan Note (Signed)
Recent CT Chest from [facility] reviewed, imaging not available only report: -Multifocal consolidative opacities visualized throughout the lungs. Differential considerations include multifocal infection, drug toxicity, and/or multifocal neoplasm. Correlation with prior imaging is recommended. - Trace pulmonary edema. Moderate right pleural effusion and small left pleural effusion.   Plan: -Prednisone 15 mg daily until September 30, then October 1-October 31 10 mg of prednisone daily then November 1 until follow-up 5 mg of prednisone daily -Hold Breo since it is not providing much clinical benefit, if symptoms return she is advised to restart Breo -The majority of her symptoms at this time are related to postnasal drip, restart Flonase -Continue with exercise as tolerated

## 2016-05-26 NOTE — Assessment & Plan Note (Signed)
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   Today, the majority of her symptoms are from nasal congestion and postnasal drip, she does not have any significant wheezing on lung examination nor decreased airflow on my examination   Advised to continue with allergy regiment - restart nasal saline rinses, and flonase.  Plan: -Treat radiation pneumonitis/fibrosis with prednisone - cont nasal saline rinses (2-3 times per week) -Restart Flonase in the mornings and/or evenings, one actuation per nostril, do not use for more than 3 days consecutively

## 2016-05-26 NOTE — Progress Notes (Signed)
Date: 05/26/2016  MRN# [id number] Jenny Cooper 07/23/1936  PMD - Dr. Gayland Cooper Jenny Cooper is a 80 y.o. old female seen in follow up for new LLL mass.   CC:  Chief Complaint  Patient presents with  . Follow-up    21morov. pt states breathing has improved. c/o slight sob with exterion, mild dry cough at times prod with clear & occ wheezing.    Synopsis - 80year old female first evaluated by pulmonary in early 2016 for chronic cough, found to have right hilar mass, biopsy of mass and pathology specimens positive for squamous cell, stage IIIa. Now status post chemoradiation, recently found to have left lower lobe mass, with chronic cough, chronic antibiotics. Biopsy, EBUS, ENB, of left lower lobe mass negative for malignancy  Events since last clinic visit: Patient presents today for follow-up visit of chronic cough, along with postradiation fibrosis. Patient states since her last visit,  she noticed that her cough is worse in the morning and congestion, with improvement during the day. She still has postnasal drip. She has been on a trial of Breo, which she does not believe is helping and would like to hold it for now. At last visit her steroids were tapered to '5mg'$  daily, but she continued with Prednisone '15mg'$  daily. Since her last visit she has also had an abalation for A.fib, and her breathing is much better.  [facility]  PMHX:   Past Medical History:  Diagnosis Date  . Atypical atrial flutter ([facility]    a. s/p ablation 07/27/2013 followed by Dr. GRockey Situ . CAD (coronary artery disease)    a. s/p MI x 2 in 2002 s/p PCI x 2 in 2002; b. s/p 2v CABG 2002; c. stress echo 07/2004 w/ evi of pos & inf infarct & no evi of ischemia; d. 4/08 dipyridamole scan w/ multiple areas of infarct, no ischemia, EF 49%; e. cath 04/28/15 3v CAD, med Rx rec, no targets for revasc, LM lum irregs, pLAD 30%, 100%, ost-pLCx 60%, [name] 99%,  OM2 100%, p-mRCA 90%, [name] 100% L-R collats, VG-mLAD irregs, VG-OM2 oc  . Carcinoma of right lung ([facility] 01/03/2015   a. followed by Dr. COliva Bustard . Chronic systolic CHF (congestive heart failure) ([facility]    a. echo 03/2015: EF 30-35%, sev ant/inf/pos HK, in mild to mod MR  . COPD (chronic obstructive pulmonary disease) ([facility]   . GERD (gastroesophageal reflux disease)   . History of blood clots    12/2001  . History of colonoscopy 2013  . History of mammography, screening 2015  . History of Papanicolaou smear of cervix 2013  . HLD (hyperlipidemia)   . HTN (hypertension)   . Hypothyroidism   . Lung cancer ([facility]   . Mitral regurgitation    a. s/p mitral ring placement 09/2000; b. echo 09/2010: EF 50%, inf HK, post AK, mild MR, prosthetic mitral valve ring w/ peak gradient of 10 mmHg; b. echo 2/13: EF 50%, mild MR/TR     . [place]ocardial infarction ([facility]    X 2 (LAST ONE IN 2002)  . Neuropathy ([facility]   . Pacemaker    a. MDT 2002; b. generator replacement 2013; c. followed by Dr. BOmelia Blackwater MD  . PAF (paroxysmal atrial fibrillation) ([facility]    a. on Eliquis    Surgical Hx:  Past Surgical History:  Procedure Laterality Date  . APPENDECTOMY    . CARDIAC CATHETERIZATION N/A 04/28/2015  Procedure: Left Heart Cath and Coronary Angiography;  Surgeon: Wellington Hampshire, MD;  Location: [place] [facility];  Service: Cardiovascular;  Laterality: N/A;  . COLONOSCOPY  12/2009   2 small tubular adenomas  . CORONARY ARTERY BYPASS GRAFT  09/2000  . ELECTROMAGNETIC NAVIGATION BROCHOSCOPY N/A 10/06/2015   Procedure: ELECTROMAGNETIC NAVIGATION BRONCHOSCOPY;  Surgeon: Flora Lipps, MD;  Location: [facility] ORS;  Service: Cardiopulmonary;  Laterality: N/A;  . ENDOBRONCHIAL ULTRASOUND N/A 10/06/2015   Procedure: ENDOBRONCHIAL ULTRASOUND;  Surgeon: Flora Lipps, MD;  Location: [facility] OR[facility];  Service: Cardiopulmonary;  Laterality: N/A;  . PACEMAKER INSERTION  03/2012  . VAGINAL HYSTERECTOMY     partial - left ovary remains   Family  Hx:  Family History  Problem Relation Age of Onset  . COPD Mother     sister, and brother  . Stroke Maternal Grandmother   . Hypertension Sister   . Hypertension Brother   . Colon cancer Brother     age 47  . Heart attack Neg Hx    Social Hx:   Social History  Substance Use Topics  . Smoking status: Former Smoker    Packs/day: 2.00    Years: 42.00    Types: Cigarettes    Quit date: 08/30/2000  . Smokeless tobacco: Never Used     Comment: quit smoking in 08/28/2000  . Alcohol use 6.0 oz/week    10 Glasses of wine per week     Comment: 2 glasses of wine per day   Medication:   Current Outpatient Rx  . Order #: [id number] Class: Normal  . Order #: [id number] Class: Historical Med  . Order #: [id number] Class: Normal  . Order #: [id number] Class: Normal  . Order #: [id number] Class: Normal  . Order #: [id number] Class: Normal  . Order #: [id number] Class: Normal  . Order #: [id number] Class: Historical Med  . Order #: [id number] Class: Normal  . Order #: [id number] Class: Normal  . Order #: [id number] Class: Normal  . Order #: [id number] Class: Historical Med      Allergies:  Lovenox [enoxaparin sodium] and Meperidine  Review of Systems: Gen:  Denies  fever, sweats, chills HEENT: Denies blurred vision, double vision, ear pain, eye pain, hearing loss, nose bleeds, sore throat. Mild runny nose mostly in the mornings Cvc:  No dizziness, chest pain or heaviness Resp:  Sob, mild wheezing, cough - mainly non productive.  Gi: Denies swallowing difficulty, stomach pain, nausea or vomiting, diarrhea, constipation, bowel incontinence Gu:  Denies bladder incontinence, burning urine Ext:   No Joint pain, stiffness or swelling Skin: No skin rash, easy bruising or bleeding or hives Endoc:  No polyuria, polydipsia , polyphagia or weight change Psych: No depression, insomnia or hallucinations  Other:  Right hand and foot numbness  Physical Examination:   VS: BP 130/82 (BP Location: Left Arm, Cuff  Size: Normal)   Pulse 100   Ht '5\' 8"'$  (1.727 m)   Wt 162 lb (73.5 kg)   SpO2 97%   BMI 24.63 kg/m   General Appearance: No distress  Neuro:without focal findings, mental status, speech normal, alert and oriented, cranial nerves 2-12 intact, reflexes normal and symmetric, sensation grossly normal - right/left hand - sensation intact, strength 5/5 HEENT: PERRLA, EOM intact, no ptosis, no other lesions noticed; Mallampati 2 Pulmonary: coarse upper airway sounds,no wheezing noted on exam today, good airway entry throughout    Sputum Production:  none CardiovascularNormal S1,S2.  No m/r/g.  Abdominal aorta pulsation normal.    Abdomen: Benign, Soft, non-tender, No masses, hepatosplenomegaly, No  lymphadenopathy Renal:  No costovertebral tenderness  GU:  No performed at this time. Endoc: No evident thyromegaly, no signs of acromegaly or Cushing features Skin:   warm, no rashes, no ecchymosis  Extremities: normal, no cyanosis, clubbing, no edema, warm with normal capillary refill. Other findings:none   Rad results: (The following images and results were reviewed by Dr. Stevenson Clinch on 05/26/2016). CT Chest 05/06/2016 ([facility]) Technique:Cardiac CT Angiogram, Pulmonary Vein Protocol.Multi-detector high pitch acquisition following intravenous administration ofIsoVue 370 contrast without adverse reaction. Multiplanar reformats were performed at a dedicated workstation per protocol.   Pulmonary Vein Findings:  2 pulmonary veins drain the left lung. The right lung is drained by 4 pulmonary veins including a right upper lobe pulmonary vein, right middle lobe pulmonary vein, right lower lobe pulmonary vein, and a small accessory right upper lobe pulmonary vein. No filling defect visualized within the left atrial appendage. Surgical changes of CABG and mitral valve replacement. Left subclavian approach cardiac pacing device with leads terminating within the right atrium anteriorly and within the  right ventricular apex.  Chest Findings:   Central airways are patent. Narrowing of the right upper lobe bronchus by extensive right hilar soft tissue is visualized. Large right perihilar soft tissue mass. Some borderline enlarged mediastinal lymph nodes are visualized. Right hilar soft tissue mass measures roughly 7.7 cm, series 3 image 55. There is local associated volume loss, interstitial thickening, and traction bronchiectasis. Multifocal consolidative opacities visualized throughout the lungs including anteriorly within the right upper lobe, posteriorly within the right lower lobe, and peripherally throughout the left lower lobe. Moderate right pleural effusion. Small left pleural effusion. Extensive mosaic attenuation visualized throughout the pulmonary parenchyma. Interlobular septal thickening visualized with lungs most prominently seen towards the lung bases. Background of apical predominant centrilobular emphysema.  Impression:  1. 2 pulmonary veins drain the left lung. 4 pulmonary veins drain the right lung including a right upper lobe pulmonary vein which is narrowed by extensive hilar soft tissue, a right middle lobe pulmonary vein, a right lower lobe pulmonary vein, and a small accessory right upper lobe pulmonary vein and adjacent to the ostium of the right lower lobe pulmonary vein. 2. No filling defects visualized within the left atrial appendage. 3. Right hilar soft tissue mass consistent with patient's history of neoplasm. It is unclear if this lesion represents residual/recurrent disease. Correlation with prior imaging is recommended. 4. Multifocal consolidative opacities visualized throughout the lungs. Differential considerations include multifocal infection, drug toxicity, and/or multifocal neoplasm. Correlation with prior imaging is recommended. 5. Trace pulmonary edema. Moderate right pleural effusion and small left pleural effusion.    ECHO  03/2014 MILD LV DYSFUNCTION  NORMAL RIGHT VENTRICULAR SYSTOLIC FUNCTION VALVULAR REGURGITATION: MILD MR, TRIVIAL PR, MILD TR PROSTHETIC VALVE(S): PROSTHETIC MV RING    Assessment and Plan: 80 year old female past medical history of chronic cough, COPD, stage IIIa non-small cell lung cancer, status post chemoradiation, seen in follow-up visit for recurrent cough, suspected postradiation pneumonitis/fibrosis.     Radiation pneumonitis [facility]) Recent CT Chest from [facility] reviewed, imaging not available only report: -Multifocal consolidative opacities visualized throughout the lungs. Differential considerations include multifocal infection, drug toxicity, and/or multifocal neoplasm. Correlation with prior imaging is recommended. - Trace pulmonary edema. Moderate right pleural effusion and small left pleural effusion.   Plan: -Prednisone 15 mg daily until September 30, then October 1-October 31 10 mg of prednisone daily then November 1 until follow-up 5 mg of prednisone daily -Hold Breo since it is not providing much clinical  benefit, if symptoms return she is advised to restart Breo -The majority of her symptoms at this time are related to postnasal drip, restart Flonase -Continue with exercise as tolerated          Cough Multifactorial: Postradiation pneumonitis, fibrosis, architectural distortion, anatomical distortion of the right mainstem, right hilum inflammation, left lower lobe mass, COPD, Patient will likely have chronic cough, and some level of chronic dyspnea level the persistent post radiation fibrosis in the right lung.   Today, the majority of her symptoms are from nasal congestion and postnasal drip, she does not have any significant wheezing on lung examination nor decreased airflow on my examination   Advised to continue with allergy regiment - restart nasal saline rinses, and flonase.  Plan: -Treat radiation pneumonitis/fibrosis with prednisone - cont nasal  saline rinses (2-3 times per week) -Restart Flonase in the mornings and/or evenings, one actuation per nostril, do not use for more than 3 days consecutively           Updated Medication List Outpatient Encounter Prescriptions as of 05/26/2016  Medication Sig  . albuterol (PROVENTIL HFA;VENTOLIN HFA) 108 (90 Base) MCG/ACT inhaler Inhale 2 puffs into the lungs every 6 (six) hours as needed for wheezing or shortness of breath.  Marland Kitchen apixaban (ELIQUIS) 5 MG TABS tablet Take 5 mg by mouth 2 (two) times daily.  Marland Kitchen atorvastatin (LIPITOR) 20 MG tablet Take 1 tablet (20 mg total) by mouth at bedtime.  . Cetirizine HCl 10 MG CAPS Take 1 capsule (10 mg total) by mouth 1 day or 1 dose.  Marland Kitchen ENTRESTO 24-26 MG TAKE 1 TABLET BY MOUTH TWICE A DAY  . fluticasone furoate-vilanterol (BREO ELLIPTA) 100-25 MCG/INH AEPB Inhale 1 puff into the lungs daily.  . furosemide (LASIX) 20 MG tablet Take 1 tablet (20 mg total) by mouth daily as needed.  Marland Kitchen levothyroxine (SYNTHROID, LEVOTHROID) 75 MCG tablet Take 75 mcg by mouth daily before breakfast.  . metoprolol succinate (TOPROL-XL) 25 MG 24 hr tablet TAKE 1 TABLET BY MOUTH EVERY MORNING AND 2 TABS IN THE EVENING  . omeprazole (PRILOSEC) 20 MG capsule Take 1 capsule (20 mg total) by mouth every morning.  . predniSONE (DELTASONE) 5 MG tablet Take 2 tabs ('10mg'$ ) daily until 04/22/16 then take 1 tab ('5mg'$ ) until followup appointment  . vitamin B-12 (CYANOCOBALAMIN) 1000 MCG tablet Take 1,000 mcg by mouth daily.   Facility-Administered Encounter Medications as of 05/26/2016  Medication  . sodium chloride 0.9 % injection 10 mL  . sodium chloride flush (NS) 0.9 % injection 10 mL    Orders for this visit: No orders of the defined types were placed in this encounter.    Thank  you for the consultation and for allowing [facility] to assist in the care of your patient. Our recommendations are noted above.  Please contact us if we can be of further  service.   Vilinda Boehringer, MD [facility] Office Number: [phone]

## 2016-05-27 ENCOUNTER — Emergency Department: Payer: Medicare Other

## 2016-05-27 ENCOUNTER — Emergency Department
Admission: EM | Admit: 2016-05-27 | Discharge: 2016-05-27 | Disposition: A | Discharge status: Home / Self Care | Payer: Medicare Other | Attending: Emergency Medicine | Admitting: Emergency Medicine

## 2016-05-27 ENCOUNTER — Encounter: Payer: Self-pay | Admitting: Emergency Medicine

## 2016-05-27 ENCOUNTER — Other Ambulatory Visit: Payer: Self-pay

## 2016-05-27 DIAGNOSIS — I110 Hypertensive heart disease with heart failure: Secondary | ICD-10-CM | POA: Insufficient documentation

## 2016-05-27 DIAGNOSIS — S32591A Other specified fracture of right pubis, initial encounter for closed fracture: Secondary | ICD-10-CM | POA: Diagnosis not present

## 2016-05-27 DIAGNOSIS — Z950 Presence of cardiac pacemaker: Secondary | ICD-10-CM | POA: Diagnosis not present

## 2016-05-27 DIAGNOSIS — W19XXXA Unspecified fall, initial encounter: Secondary | ICD-10-CM

## 2016-05-27 DIAGNOSIS — S299XXA Unspecified injury of thorax, initial encounter: Secondary | ICD-10-CM

## 2016-05-27 DIAGNOSIS — W1839XA Other fall on same level, initial encounter: Secondary | ICD-10-CM | POA: Insufficient documentation

## 2016-05-27 DIAGNOSIS — Z79899 Other long term (current) drug therapy: Secondary | ICD-10-CM | POA: Insufficient documentation

## 2016-05-27 DIAGNOSIS — Y92000 Kitchen of unspecified non-institutional (private) residence as  the place of occurrence of the external cause: Secondary | ICD-10-CM | POA: Insufficient documentation

## 2016-05-27 DIAGNOSIS — Z85118 Personal history of other malignant neoplasm of bronchus and lung: Secondary | ICD-10-CM | POA: Diagnosis not present

## 2016-05-27 DIAGNOSIS — Y999 Unspecified external cause status: Secondary | ICD-10-CM | POA: Insufficient documentation

## 2016-05-27 DIAGNOSIS — R55 Syncope and collapse: Secondary | ICD-10-CM | POA: Insufficient documentation

## 2016-05-27 DIAGNOSIS — I252 Old myocardial infarction: Secondary | ICD-10-CM | POA: Insufficient documentation

## 2016-05-27 DIAGNOSIS — Z87891 Personal history of nicotine dependence: Secondary | ICD-10-CM | POA: Insufficient documentation

## 2016-05-27 DIAGNOSIS — N390 Urinary tract infection, site not specified: Secondary | ICD-10-CM | POA: Diagnosis not present

## 2016-05-27 DIAGNOSIS — S79911A Unspecified injury of right hip, initial encounter: Secondary | ICD-10-CM | POA: Diagnosis present

## 2016-05-27 DIAGNOSIS — Y9389 Activity, other specified: Secondary | ICD-10-CM | POA: Insufficient documentation

## 2016-05-27 DIAGNOSIS — E119 Type 2 diabetes mellitus without complications: Secondary | ICD-10-CM | POA: Insufficient documentation

## 2016-05-27 DIAGNOSIS — I2510 Atherosclerotic heart disease of native coronary artery without angina pectoris: Secondary | ICD-10-CM | POA: Insufficient documentation

## 2016-05-27 DIAGNOSIS — E039 Hypothyroidism, unspecified: Secondary | ICD-10-CM | POA: Diagnosis not present

## 2016-05-27 DIAGNOSIS — I5022 Chronic systolic (congestive) heart failure: Secondary | ICD-10-CM | POA: Insufficient documentation

## 2016-05-27 DIAGNOSIS — J449 Chronic obstructive pulmonary disease, unspecified: Secondary | ICD-10-CM | POA: Insufficient documentation

## 2016-05-27 HISTORY — DX: Other specified fracture of right pubis, initial encounter for closed fracture: S32.591A

## 2016-05-27 LAB — URINALYSIS COMPLETE WITH MICROSCOPIC (ARMC ONLY)
BILIRUBIN URINE: NEGATIVE
Bacteria, UA: NONE SEEN
Glucose, UA: NEGATIVE mg/dL
KETONES UR: NEGATIVE mg/dL
NITRITE: POSITIVE — AB
PH: 7 (ref 5.0–8.0)
Protein, ur: NEGATIVE mg/dL
Specific Gravity, Urine: 1.009 (ref 1.005–1.030)

## 2016-05-27 LAB — BASIC METABOLIC PANEL
Anion gap: 9 (ref 5–15)
BUN: 20 mg/dL (ref 6–20)
CO2: 26 mmol/L (ref 22–32)
Calcium: 9.5 mg/dL (ref 8.9–10.3)
Chloride: 101 mmol/L (ref 101–111)
Creatinine, Ser: 0.81 mg/dL (ref 0.44–1.00)
Glucose, Bld: 144 mg/dL — ABNORMAL HIGH (ref 65–99)
POTASSIUM: 4.1 mmol/L (ref 3.5–5.1)
SODIUM: 136 mmol/L (ref 135–145)

## 2016-05-27 LAB — CBC
HCT: 41.9 % (ref 35.0–47.0)
Hemoglobin: 14.4 g/dL (ref 12.0–16.0)
MCH: 30.9 pg (ref 26.0–34.0)
MCHC: 34.4 g/dL (ref 32.0–36.0)
MCV: 89.7 fL (ref 80.0–100.0)
PLATELETS: 190 10*3/uL (ref 150–440)
RBC: 4.67 MIL/uL (ref 3.80–5.20)
RDW: 15.9 % — ABNORMAL HIGH (ref 11.5–14.5)
WBC: 7.1 10*3/uL (ref 3.6–11.0)

## 2016-05-27 LAB — TROPONIN I
TROPONIN I: 0.03 ng/mL — AB (ref <0.03)
Troponin I: <0.03 ng/mL (ref <0.03)

## 2016-05-27 MED ORDER — CEPHALEXIN 500 MG PO CAPS
500.0000 mg | ORAL_CAPSULE | Freq: Once | ORAL | Status: AC
  Administered 2016-05-27: 500 mg via ORAL
  Filled 2016-05-27: qty 1

## 2016-05-27 MED ORDER — IOPAMIDOL (ISOVUE-300) INJECTION 61%
75.0000 mL | Freq: Once | INTRAVENOUS | Status: AC | PRN
  Administered 2016-05-27: 75 mL via INTRAVENOUS

## 2016-05-27 MED ORDER — CEPHALEXIN 500 MG PO CAPS: 500.0000 mg | ORAL_CAPSULE | Freq: Four times a day (QID) | ORAL | 0 refills | Status: AC

## 2016-05-27 MED ORDER — SENNA 8.6 MG PO TABS: 1.0000 | ORAL_TABLET | Freq: Every day | ORAL | 0 refills | Status: DC

## 2016-05-27 MED ORDER — OXYCODONE HCL 5 MG PO TABS
5.0000 mg | ORAL_TABLET | Freq: Once | ORAL | Status: AC
  Administered 2016-05-27: 5 mg via ORAL
  Filled 2016-05-27: qty 1

## 2016-05-27 MED ORDER — OXYCODONE HCL 5 MG PO TABS: 5.0000 mg | ORAL_TABLET | Freq: Three times a day (TID) | ORAL | 0 refills | Status: DC | PRN

## 2016-05-27 MED ORDER — FENTANYL CITRATE (PF) 100 MCG/2ML IJ SOLN
25.0000 ug | Freq: Once | INTRAMUSCULAR | Status: AC
  Administered 2016-05-27: 25 ug via INTRAVENOUS
  Filled 2016-05-27: qty 2

## 2016-05-27 MED ORDER — ACETAMINOPHEN 500 MG PO TABS
1000.0000 mg | ORAL_TABLET | Freq: Once | ORAL | Status: AC
Start: 2016-05-27 — End: 2016-05-27
  Administered 2016-05-27: 1000 mg via ORAL
  Filled 2016-05-27: qty 2

## 2016-05-27 NOTE — Discharge Instructions (Signed)
Take antibiotics as prescribed for urinary tract infection. Drink plenty of fluids to help clear the infection.  Pain control: Take tylenol '1000mg'$  every 8 hours. Take '5mg'$  of oxycodone every 6 hours for breakthrough pain. If you need the oxycodone make sure to take one senokot as well to prevent constipation.  Do not drink alcohol, drive or participate in any other potentially dangerous activities while taking this medication as it may make you sleepy. Do not take this medication with any other sedating medications, either prescription or over-the-counter.

## 2016-05-27 NOTE — ED Provider Notes (Addendum)
[facility] Emergency Department Provider Note  ____________________________________________  Time seen: Approximately 11:31 AM  I have reviewed the triage vital signs and the nursing notes.   HISTORY  Chief Complaint Fall and Hip Pain   HPI Jenny Cooper is a 80 y.o. female history of atrial flutter status post ablation 2 weeks ago on Eliquis, s/p pacemaker, CAD status post CABG and multiple stents, CHF, lung cancer, COPD, hypertension, hyperlipidemia who presents for evaluation of right hip pain status post fall. Patient reports that she was reaching out into her cup board in the kitchen to grab a cup Day she remembers she is on the floor. Her son was in the next room and heard a loud noise. He found her on the floor and started talking to her. He said that for about 5-6 seconds she did not respond to him but then immediately started answering questions. There was no seizure-like activity, no urinary or bowel loss, she was not confused. She was complaining of severe pain in her right hip and was unable to bear weight. Son went in to bring her to the hospital however patient wanted to sleep and see if she felt better the next day. This morning she continues to have severe pain on her right hip that is only present when she tries to bear weight which prompted her to come to the emergency room. Patient denies hitting her head, denies a headache, changes in vision, chest pain, shortness of breath, abdominal pain, nausea, vomiting. Patient reports that laying in bed she has no pain in her hip. She is also complaining of bilateral flank pain since the fall yesterday. Patient is also complaining of dysuria that has been going on for 1-2 weeks however she does report no flank pain prior to the fall, no chills or fever, no suprapubic pain.  Past Medical History:  Diagnosis Date  . Atypical atrial flutter ([place])    a. s/p ablation 07/27/2013 followed by Dr. Rockey Situ  .  CAD (coronary artery disease)    a. s/p MI x 2 in 2002 s/p PCI x 2 in 2002; b. s/p 2v CABG 2002; c. stress echo 07/2004 w/ evi of pos & inf infarct & no evi of ischemia; d. 4/08 dipyridamole scan w/ multiple areas of infarct, no ischemia, EF 49%; e. cath 04/28/15 3v CAD, med Rx rec, no targets for revasc, LM lum irregs, pLAD 30%, 100%, ost-pLCx 60%, [name] 99%, OM2 100%, p-mRCA 90%, [name] 100% L-R collats, VG-mLAD irregs, VG-OM2 oc  . Carcinoma of right lung ([place]) 01/03/2015   a. followed by Dr. Oliva Bustard  . Chronic systolic CHF (congestive heart failure) ([place])    a. echo 03/2015: EF 30-35%, sev ant/inf/pos HK, in mild to mod MR  . COPD (chronic obstructive pulmonary disease) ([place])   . GERD (gastroesophageal reflux disease)   . History of blood clots    12/2001  . History of colonoscopy 2013  . History of mammography, screening 2015  . History of Papanicolaou smear of cervix 2013  . HLD (hyperlipidemia)   . HTN (hypertension)   . Hypothyroidism   . Lung cancer ([place])   . Mitral regurgitation    a. s/p mitral ring placement 09/2000; b. echo 09/2010: EF 50%, inf HK, post AK, mild MR, prosthetic mitral valve ring w/ peak gradient of 10 mmHg; b. echo 2/13: EF 50%, mild MR/TR     . Myocardial infarction ([place])    X 2 (LAST ONE IN 2002)  .  Neuropathy ([place])   . Pacemaker    a. MDT 2002; b. generator replacement 2013; c. followed by Dr. Omelia Blackwater, MD  . PAF (paroxysmal atrial fibrillation) [facility])    a. on Eliquis     Patient Active Problem List   Diagnosis Date Noted  . Cancer of hilus of left lung ([place]) 05/25/2016  . DM type 2, goal A1c below 7 11/17/2015  . Hx of adenomatous colonic polyps 11/17/2015  . Radiation pneumonitis ([place]) 10/21/2015  . Mass of lower lobe of left lung 10/21/2015  . COPD exacerbation ([place]) 10/06/2015  . Chronic systolic CHF (congestive heart failure) ([place])   . PAF (paroxysmal atrial fibrillation) ([place])   . Mitral regurgitation   . HLD (hyperlipidemia)   . Paroxysmal  supraventricular tachycardia ([place])   . Acute respiratory failure with hypoxia and hypercapnia (H[facility])   . Coronary artery disease involving coronary bypass graft of native heart without angina pectoris   . NSTEMI (non-ST elevated myocardial infarction) ([place])   . Arteriosclerosis of coronary artery 01/11/2015  . Hypothyroidism (acquired) 01/11/2015  . Carcinoma of right lung ([place]) 01/03/2015  . Disorder of peripheral nervous system ([place]) 10/04/2014  . 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 Cervical radiculopathy, chronic 10/04/2014  . Squamous cell carcinoma of lung ([place]) 10/04/2014  . Malignant neoplasm of unspecified part of right bronchus or lung ([place]) 10/04/2014  . Cough 09/09/2014  . Dyspnea 09/09/2014  . Atrial flutter ([place]) 11/16/2013  . CAFL (chronic airflow limitation) ([place]) 04/24/2012  . Chronic obstructive pulmonary disease ([place]) 04/24/2012  . Myocardial infarction ([place]) 04/24/2012  . Hypertension 08/03/2011  . Artificial cardiac pacemaker 08/03/2011    Past Surgical History:  Procedure Laterality Date  . APPENDECTOMY    . CARDIAC CATHETERIZATION N/A 04/28/2015   Procedure: Left Heart Cath and Coronary Angiography;  Surgeon: Wellington Hampshire, MD;  Location: [facility];  Service: Cardiovascular;  Laterality: N/A;  . COLONOSCOPY  12/2009   2 small tubular adenomas  . CORONARY ARTERY BYPASS GRAFT  09/2000  . ELECTROMAGNETIC NAVIGATION BROCHOSCOPY N/A 10/06/2015   Procedure: ELECTROMAGNETIC NAVIGATION BRONCHOSCOPY;  Surgeon: Flora Lipps, MD;  Location: [facility] ORS;  Service: Cardiopulmonary;  Laterality: N/A;  . ENDOBRONCHIAL ULTRASOUND N/A 10/06/2015   Procedure: ENDOBRONCHIAL ULTRASOUND;  Surgeon: Flora Lipps, MD;  Location: [facility] ORS;  Service: Cardiopulmonary;  Laterality: N/A;  . PACEMAKER INSERTION  03/2012  . VAGINAL HYSTERECTOMY     partial - left ovary remains    Prior to Admission medications   Medication Sig Start Date End Date Taking? Authorizing  Provider  apixaban (ELIQUIS) 5 MG TABS tablet Take 5 mg by mouth 2 (two) times daily.   Yes Historical Provider, MD  atorvastatin (LIPITOR) 20 MG tablet Take 1 tablet (20 mg total) by mouth at bedtime. 03/21/16  Yes Glean Hess, MD  Cetirizine HCl 10 MG CAPS Take 1 capsule (10 mg total) by mouth 1 day or 1 dose. 05/19/15  Yes Ryan M Dunn, PA-C  ENTRESTO 24-26 MG TAKE 1 TABLET BY MOUTH TWICE A DAY 05/04/16  Yes Minna Merritts, MD  furosemide (LASIX) 20 MG tablet Take 1 tablet (20 mg total) by mouth daily as needed. Patient taking differently: Take 20 mg by mouth as needed.  02/16/16  Yes Minna Merritts, MD  levothyroxine (SYNTHROID, LEVOTHROID) 75 MCG tablet Take 75 mcg by mouth daily before breakfast.   Yes Historical Provider, MD  metoprolol succinate (TOPROL-XL) 25 MG 24 hr tablet TAKE 1 TABLET BY  MOUTH EVERY MORNING AND 2 TABS IN THE EVENING 03/29/16  Yes Minna Merritts, MD  omeprazole (PRILOSEC) 20 MG capsule Take 1 capsule (20 mg total) by mouth every morning. 11/03/15  Yes Forest Gleason, MD  predniSONE (DELTASONE) 10 MG tablet Take 1 tablet (10 mg total) by mouth daily with breakfast. I tablet starting 05-30-16  Until 06-29-16 05/26/16  Yes Vishal Mungal, MD  vitamin B-12 (CYANOCOBALAMIN) 1000 MCG tablet Take 1,000 mcg by mouth daily.   Yes Historical Provider, MD  albuterol (PROVENTIL HFA;VENTOLIN HFA) 108 (90 Base) MCG/ACT inhaler Inhale 2 puffs into the lungs every 6 (six) hours as needed for wheezing or shortness of breath. Patient not taking: Reported on 05/27/2016 02/24/16   Vilinda Boehringer, MD  cephALEXin (KEFLEX) 500 MG capsule Take 1 capsule (500 mg total) by mouth 4 (four) times daily. 05/27/16 06/03/16  Rudene Re, MD  fluticasone furoate-vilanterol (BREO ELLIPTA) 100-25 MCG/INH AEPB Inhale 1 puff into the lungs daily. Patient not taking: Reported on 05/27/2016 03/22/16   Vilinda Boehringer, MD  oxyCODONE (ROXICODONE) 5 MG immediate release tablet Take 1 tablet (5 mg total) by mouth every  8 (eight) hours as needed. 05/27/16 05/27/17  Rudene Re, MD  predniSONE (DELTASONE) 5 MG tablet Take 2 tabs ('10mg'$ ) daily until 04/22/16 then take 1 tab ('5mg'$ ) until followup appointment Patient not taking: Reported on 05/27/2016 04/05/16   Vilinda Boehringer, MD  predniSONE (DELTASONE) 5 MG tablet Take 1 tablet (5 mg total) by mouth daily with breakfast. 1 tablet starting 06-30-16 until follow up visit Patient not taking: Reported on 05/27/2016 05/26/16   Vilinda Boehringer, MD  senna (SENOKOT) 8.6 MG TABS tablet Take 1 tablet (8.6 mg total) by mouth daily. 05/27/16   Rudene Re, MD    Allergies Lovenox [enoxaparin sodium] and Meperidine  Family History  Problem Relation Age of Onset  . COPD Mother     sister, and brother  . Stroke Maternal Grandmother   . Hypertension Sister   . Hypertension Brother   . Colon cancer Brother     age 57  . Heart attack Neg Hx     Social History Social History  Substance Use Topics  . Smoking status: Former Smoker    Packs/day: 2.00    Years: 42.00    Types: Cigarettes    Quit date: 08/30/2000  . Smokeless tobacco: Never Used     Comment: quit smoking in 08/28/2000  . Alcohol use 6.0 oz/week    10 Glasses of wine per week     Comment: 2 glasses of wine per day    Review of Systems Constitutional: Negative for fever. Eyes: Negative for visual changes. ENT: Negative for facial injury or neck injury Cardiovascular: Negative for chest injury. Respiratory: Negative for shortness of breath. Negative for chest wall injury. Gastrointestinal: Negative for abdominal pain or injury. Genitourinary: Negative for dysuria. + b/l flank pain Musculoskeletal: Negative for back injury, negative for arm or leg pain. + R hip pain Skin: Negative for laceration/abrasions. Neurological: Negative for head injury.   ____________________________________________   PHYSICAL EXAM:  VITAL SIGNS: ED Triage Vitals  Enc Vitals Group     BP 05/27/16 1058 (!) 159/76      Pulse Rate 05/27/16 1058 (!) 58     Resp 05/27/16 1058 16     Temp 05/27/16 1058 98.2 F (36.8 C)     Temp Source 05/27/16 1058 Oral     SpO2 05/27/16 1058 (!) 89 %     Weight 05/27/16  1058 161 lb (73 kg)     Height 05/27/16 1058 '5\' 8"'$  (1.727 m)     Head Circumference --      Peak Flow --      Pain Score 05/27/16 1059 6     Pain Loc --      Pain Edu? --      Excl. in [place]? --     Constitutional: Alert and oriented. No acute distress. Does not appear intoxicated. HEENT Head: Normocephalic and atraumatic. Face: No facial bony tenderness. Stable midface Ears: No hemotympanum bilaterally. No Battle sign Eyes: No eye injury. PERRL. No raccoon eyes Nose: Nontender. No epistaxis. No rhinorrhea Mouth/Throat: Mucous membranes are moist. No oropharyngeal blood. No dental injury. Airway patent without stridor. Normal voice. Neck: no C-collar in place. No midline c-spine tenderness.  Cardiovascular: Normal rate, regular rhythm. Normal and symmetric distal pulses are present in all extremities. Pulmonary/Chest: moderate ttp over the left upper chest wall with no bruising or deformities. Normal respiratory effort. Breath sounds are normal. No crepitus.  Abdominal: Soft, nontender, non distended. ttp over the bilateral flank Musculoskeletal: Tenderness with external rotation of the right hip, no tenderness over the hip joint, Nontender with normal full range of motion in all other extremities. No deformities. No thoracic or lumbar midline spinal tenderness. Pelvis is stable. Skin: Skin is warm, dry and intact. No abrasions or contutions. Psychiatric: Speech and behavior are appropriate. Neurological: Normal speech and language. Moves all extremities to command. No gross focal neurologic deficits are appreciated.  Glascow Coma Score: 4 - Opens eyes on own 6 - Follows simple motor commands 5 - Alert and oriented GCS: 15   ____________________________________________   LABS (all labs ordered  are listed, but only abnormal results are displayed)  Labs Reviewed  TROPONIN I - Abnormal; Notable for the following:       Result Value   Troponin I 0.03 (*)    All other components within normal limits  CBC - Abnormal; Notable for the following:    RDW 15.9 (*)    All other components within normal limits  BASIC METABOLIC PANEL - Abnormal; Notable for the following:    Glucose, Bld 144 (*)    All other components within normal limits  URINALYSIS COMPLETEWITH MICROSCOPIC ([facility] ONLY) - Abnormal; Notable for the following:    Color, Urine YELLOW (*)    APPearance HAZY (*)    Hgb urine dipstick 1+ (*)    Nitrite POSITIVE (*)    Leukocytes, UA 3+ (*)    Squamous Epithelial / LPF 0-5 (*)    All other components within normal limits  URINE CULTURE  TROPONIN I   ____________________________________________  EKG  ED ECG REPORT I, Rudene Re, the attending physician, personally viewed and interpreted this ECG.  Atrial paced rhythm, rate of 60, normal QTC, normal axis, no ST elevations or depressions.  ____________________________________________  RADIOLOGY  HEAD CT: No acute intracranial abnormalities. No skull fracture.  CERVICAL CT: No fracture or acute abnormality.  CT c/a/p: Nondisplaced fractures of right superior and inferior pubic rami. No other traumatic injury identified within the chest, abdomen, or pelvis.  Increased tiny right pleural effusion and right lower lobe atelectasis. Stable post radiation changes in central right lung.  New 1.5 cm posterior left lower lobe nodular density, with differential diagnosis including carcinoma as well as infectious or inflammatory etiologies. Recommend short-term followup by chest CT in 3 months. ____________________________________________   PROCEDURES  Procedure(s) performed: None Procedures Critical Care performed:  None ____________________________________________   INITIAL IMPRESSION / ASSESSMENT  AND PLAN / ED COURSE  80 y.o. female history of atrial flutter status post ablation 2 weeks ago on Eliquis, s/p pacemaker, CAD status post CABG and multiple stents, CHF, lung cancer, COPD, hypertension, hyperlipidemia who presents for evaluation of right hip pain status post syncope last night. Patient's description of the episode to me is more concerning for cardiac syncope with no preceding symptoms especially since patient is postoperative day 14 from an ablation. She does have a pacemaker so we will try to reach out to [employer] for a read of her pacemaker when this episode happened last night. We'll watch patient on telemetry. Will pursue CT head and cervical spine and since patient has significant tenderness on her rib cage on the left and bilateral flanks will pursue CT chest abdomen pelvis that we'll also evaluate her hip and pelvis. Will check labs, EKG, troponin, UA. Will treat pain with tylenol PO '1000mg'$  and oxycodone '5mg'$  PO.  Clinical Course  Comment By Time  Pacer was interrogated and did not show any evidence of arrhythmias or bradycardia. However patient pacer is programmed to only the tach arrhythmias that happened for more than 5 beats with a rate of greater than 180 beats per minute. Patient has remained stable with no further episodes of syncope here or arrhythmia on telemetry. Her second troponin is pending at this time. She does have a urinary tract infection for which I started her on Keflex. Patient also found to have pubic rami fractures. Plan is to attempt to ambulate with a walker and his pain is well controlled she will go home. I have discussed patient with her cardiologist Dr. Rockey Situ who is in agreement with the plan and will order a 30 day Holter monitor to be delivered by [employer] to patient's home to monitor her for any further episodes if they happen.  Rudene Re, MD 09/28 1443  2nd troponin negative. Patient remains stable with no arrhythmias. Pain well controlled.  Ambulated with walker which she will take home. Will dc home with close f/u with PCP. Rudene Re, MD 09/28 [phone]   Patient given walker for home.  Pertinent labs & imaging results that were available during my care of the patient were reviewed by me and considered in my medical decision making (see chart for details).    ____________________________________________   FINAL CLINICAL IMPRESSION(S) / ED DIAGNOSES  Final diagnoses:  Fall, initial encounter  Closed fracture of multiple pubic rami, right, initial encounter ([place])  Syncope, unspecified syncope type  UTI (lower urinary tract infection)      NEW MEDICATIONS STARTED DURING THIS VISIT:  New Prescriptions   CEPHALEXIN (KEFLEX) 500 MG CAPSULE    Take 1 capsule (500 mg total) by mouth 4 (four) times daily.   OXYCODONE (ROXICODONE) 5 MG IMMEDIATE RELEASE TABLET    Take 1 tablet (5 mg total) by mouth every 8 (eight) hours as needed.   SENNA (SENOKOT) 8.6 MG TABS TABLET    Take 1 tablet (8.6 mg total) by mouth daily.     Note:  This document was prepared using Dragon voice recognition software and may include unintentional dictation errors.    Rudene Re, MD 05/27/16 [place], MD 05/27/16 [phone]

## 2016-05-27 NOTE — ED Triage Notes (Signed)
Per EMS, patient fell at home approx. 9pm last night due to getting off balance. Denies getting dizzy, SOB, or n/v.  Patient fell flat on face and hurt right hip and left cheek.  Patient is on blood thinners.  Hx of lung cancer and atrial fib.  Recently had an ablation. BP 590B systolic, HR 82, 31PE. Unable to bear weight on right side.  No indication of swelling, deformity, or change in limb length.

## 2016-05-27 NOTE — ED Notes (Signed)
Pt states no pain at the moment and refusing pain medication at the time.

## 2016-05-28 ENCOUNTER — Telehealth: Payer: Self-pay | Admitting: Cardiovascular Disease

## 2016-05-28 DIAGNOSIS — R55 Syncope and collapse: Secondary | ICD-10-CM

## 2016-05-28 NOTE — Telephone Encounter (Signed)
Spoke w/ pt.  Advised her that Dr. Rockey Situ requests a 30 day event monitor for pt after her visit to the ED yesterday for syncope.  She is agreeable to this and requests that [place], as she does not want something w/ wires and her children can help her w/ it.  She is appreciative and will call back w/ any questions or concerns.

## 2016-05-29 LAB — URINE CULTURE

## 2016-05-30 ENCOUNTER — Encounter: Payer: Self-pay | Admitting: Internal Medicine

## 2016-05-30 DIAGNOSIS — S32591A Other specified fracture of right pubis, initial encounter for closed fracture: Secondary | ICD-10-CM | POA: Insufficient documentation

## 2016-05-30 HISTORY — DX: Other specified fracture of right pubis, initial encounter for closed fracture: S32.591A

## 2016-05-31 ENCOUNTER — Encounter: Payer: Self-pay | Admitting: Internal Medicine

## 2016-05-31 ENCOUNTER — Ambulatory Visit (INDEPENDENT_AMBULATORY_CARE_PROVIDER_SITE_OTHER): Payer: Medicare Other | Admitting: Internal Medicine

## 2016-05-31 VITALS — BP 112/76 | HR 62 | Resp 16 | Ht 68.0 in | Wt 164.8 lb

## 2016-05-31 DIAGNOSIS — N3000 Acute cystitis without hematuria: Secondary | ICD-10-CM | POA: Diagnosis not present

## 2016-05-31 DIAGNOSIS — S32591A Other specified fracture of right pubis, initial encounter for closed fracture: Secondary | ICD-10-CM

## 2016-05-31 DIAGNOSIS — I25810 Atherosclerosis of coronary artery bypass graft(s) without angina pectoris: Secondary | ICD-10-CM | POA: Diagnosis not present

## 2016-05-31 DIAGNOSIS — Z23 Encounter for immunization: Secondary | ICD-10-CM

## 2016-05-31 MED ORDER — OXYCODONE HCL 5 MG PO TABS: 2.5000 mg | ORAL_TABLET | Freq: Three times a day (TID) | ORAL | 0 refills | Status: DC | PRN

## 2016-05-31 NOTE — Patient Instructions (Signed)
Begin stool softener with laxative for constipation due to pain medications.

## 2016-05-31 NOTE — Progress Notes (Signed)
Date:  05/31/2016   Name:  Jenny Cooper   DOB:  01-Jul-1936   MRN:  [id number]   Chief Complaint: Fall (Had injury on 05/26/2016 went to ER 05/27/2016 for evaluation of pelvic pain .) UTI - dx'd at ER and felt to be cause of the fall.  Now on Keflex qid and improving. Pelvic fracture - improving with oxycodone and tylenol.  Getting around at home with a walker. She has someone with her around-the-clock at this time. She feels that her pain is improving. She is having some constipation for which she has only taken a stool softener. She would like to start cutting back on the pain medication and wonders how much Tylenol is safe to take.  UA reviewed - culture positive for E coli pan-sensitive.  Pelvic CT reviewed - non-displaced right inf and sup pubic rami fractures.  Review of Systems  Constitutional: Negative for chills and fatigue.  Respiratory: Negative for cough, chest tightness and stridor.   Cardiovascular: Negative for chest pain, palpitations and leg swelling.  Gastrointestinal: Positive for constipation. Negative for abdominal distention, abdominal pain, blood in stool, nausea and vomiting.  Genitourinary: Positive for pelvic pain. Negative for difficulty urinating and dysuria.  Musculoskeletal: Positive for arthralgias, gait problem and myalgias.  Skin: Positive for color change (bruise from IV).  Neurological: Negative for dizziness and headaches.  Psychiatric/Behavioral: Negative for dysphoric mood and sleep disturbance.    Patient Active Problem List   Diagnosis Date Noted  . Fracture of multiple pubic rami, right, closed, initial encounter ([place]) 05/30/2016  . Cancer of hilus of left lung ([place]) 05/25/2016  . Atrial fibrillation with RVR ([place]) 05/10/2016  . Cardiomyopathy ([place]) 04/29/2016  . DM type 2, goal A1c below 7 11/17/2015  . Hx of adenomatous colonic polyps 11/17/2015  . Radiation pneumonitis ([place]) 10/21/2015  . Mass of lower lobe of left lung 10/21/2015   . COPD exacerbation ([place]) 10/06/2015  . Chronic systolic CHF (congestive heart failure) ([place])   . PAF (paroxysmal atrial fibrillation) ([place])   . Mitral regurgitation   . HLD (hyperlipidemia)   . Paroxysmal supraventricular tachycardia ([place])   . Acute respiratory failure with hypoxia and hypercapnia (HCC)   . Coronary artery disease involving coronary bypass graft of native heart without angina pectoris   . NSTEMI (non-ST elevated myocardial infarction) ([place])   . Arteriosclerosis of coronary artery 01/11/2015  . Hypothyroidism (acquired) 01/11/2015  . Carcinoma of right lung ([place]) 01/03/2015  . Disorder of peripheral nervous system ([place]) 10/04/2014  . 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 Cervical radiculopathy, chronic 10/04/2014  . Squamous cell carcinoma of lung ([place]) 10/04/2014  . Malignant neoplasm of unspecified part of right bronchus or lung ([place]) 10/04/2014  . Cough 09/09/2014  . Dyspnea 09/09/2014  . Atrial flutter ([place]) 11/16/2013  . CAFL (chronic airflow limitation) ([place]) 04/24/2012  . Chronic obstructive pulmonary disease ([place]) 04/24/2012  . Myocardial infarction 04/24/2012  . Hypertension 08/03/2011  . Artificial cardiac pacemaker 08/03/2011    Prior to Admission medications   Medication Sig Start Date End Date Taking? Authorizing Provider  acetaminophen (TYLENOL) 500 MG tablet Take 500 mg by mouth every 6 (six) hours as needed.   Yes Historical Provider, MD  albuterol (PROVENTIL HFA;VENTOLIN HFA) 108 (90 Base) MCG/ACT inhaler Inhale 2 puffs into the lungs every 6 (six) hours as needed for wheezing or shortness of breath. 02/24/16  Yes Vishal Mungal, MD  apixaban (ELIQUIS) 5 MG TABS tablet Take 5 mg  by mouth 2 (two) times daily.   Yes Historical Provider, MD  atorvastatin (LIPITOR) 20 MG tablet Take 1 tablet (20 mg total) by mouth at bedtime. 03/21/16  Yes Glean Hess, MD  cephALEXin (KEFLEX) 500 MG capsule Take 1 capsule (500 mg total) by mouth 4 (four)  times daily. 05/27/16 06/03/16 Yes Rudene Re, MD  Cetirizine HCl 10 MG CAPS Take 1 capsule (10 mg total) by mouth 1 day or 1 dose. 05/19/15  Yes Ryan M Dunn, PA-C  ENTRESTO 24-26 MG TAKE 1 TABLET BY MOUTH TWICE A DAY 05/04/16  Yes Minna Merritts, MD  furosemide (LASIX) 20 MG tablet Take 1 tablet (20 mg total) by mouth daily as needed. Patient taking differently: Take 20 mg by mouth as needed.  02/16/16  Yes Minna Merritts, MD  levothyroxine (SYNTHROID, LEVOTHROID) 75 MCG tablet Take 75 mcg by mouth daily before breakfast.   Yes Historical Provider, MD  metoprolol succinate (TOPROL-XL) 25 MG 24 hr tablet TAKE 1 TABLET BY MOUTH EVERY MORNING AND 2 TABS IN THE EVENING 03/29/16  Yes Minna Merritts, MD  omeprazole (PRILOSEC) 20 MG capsule Take 1 capsule (20 mg total) by mouth every morning. 11/03/15  Yes Forest Gleason, MD  oxyCODONE (ROXICODONE) 5 MG immediate release tablet Take 1 tablet (5 mg total) by mouth every 8 (eight) hours as needed. 05/27/16 05/27/17 Yes Rudene Re, MD  predniSONE (DELTASONE) 10 MG tablet Take 1 tablet (10 mg total) by mouth daily with breakfast. I tablet starting 05-30-16  Until 06-29-16 05/26/16  Yes Vishal Mungal, MD  predniSONE (DELTASONE) 5 MG tablet Take 1 tablet (5 mg total) by mouth daily with breakfast. 1 tablet starting 06-30-16 until follow up visit 05/26/16  Yes Vishal Mungal, MD  senna (SENOKOT) 8.6 MG TABS tablet Take 1 tablet (8.6 mg total) by mouth daily. 05/27/16  Yes Rudene Re, MD  vitamin B-12 (CYANOCOBALAMIN) 1000 MCG tablet Take 1,000 mcg by mouth daily.   Yes Historical Provider, MD    Allergies  Allergen Reactions  . Lovenox [Enoxaparin Sodium] Itching  . Meperidine Other (See Comments)    Other Reaction: pt does not like how it makes her feel    Past Surgical History:  Procedure Laterality Date  . APPENDECTOMY    . CARDIAC CATHETERIZATION N/A 04/28/2015   Procedure: Left Heart Cath and Coronary Angiography;  Surgeon: Wellington Hampshire, MD;  Location: [facility];  Service: Cardiovascular;  Laterality: N/A;  . COLONOSCOPY  12/2009   2 small tubular adenomas  . CORONARY ARTERY BYPASS GRAFT  09/2000  . ELECTROMAGNETIC NAVIGATION BROCHOSCOPY N/A 10/06/2015   Procedure: ELECTROMAGNETIC NAVIGATION BRONCHOSCOPY;  Surgeon: Flora Lipps, MD;  Location: [facility];  Service: Cardiopulmonary;  Laterality: N/A;  . ENDOBRONCHIAL ULTRASOUND N/A 10/06/2015   Procedure: ENDOBRONCHIAL ULTRASOUND;  Surgeon: Flora Lipps, MD;  Location: [facility];  Service: Cardiopulmonary;  Laterality: N/A;  . PACEMAKER INSERTION  03/2012  . VAGINAL HYSTERECTOMY     partial - left ovary remains    Social History  Substance Use Topics  . Smoking status: Former Smoker    Packs/day: 2.00    Years: 42.00    Types: Cigarettes    Quit date: 08/30/2000  . Smokeless tobacco: Never Used     Comment: quit smoking in 08/28/2000  . Alcohol use 6.0 oz/week    10 Glasses of wine per week     Comment: 2 glasses of wine per day     Medication list has  been reviewed and updated.   Physical Exam  Constitutional: She is oriented to person, place, and time. She appears well-developed and well-nourished.  Neck: Normal range of motion. Neck supple.  Cardiovascular: Normal rate, regular rhythm and normal heart sounds.   Pulmonary/Chest: Effort normal and breath sounds normal.  Abdominal: Soft. Bowel sounds are normal. There is no tenderness.  Musculoskeletal: She exhibits tenderness. She exhibits no edema.       Right hip: She exhibits decreased range of motion, decreased strength and tenderness.       Left hip: She exhibits normal range of motion, normal strength and no tenderness.  Neurological: She is alert and oriented to person, place, and time.    BP 112/76   Pulse 62   Resp 16   Ht '5\' 8"'$  (1.727 m)   Wt 164 lb 12.8 oz (74.8 kg)   SpO2 95%   BMI 25.06 kg/m   Assessment and Plan: 1. Fracture of multiple pubic rami, right, closed, initial  encounter ([place]) Begin Calcium plus D 1200 mg per day Xray in one month Taper oxycodone as tolerated - Ambulatory referral to [facility] - oxyCODONE (ROXICODONE) 5 MG immediate release tablet; Take 0.5-1 tablets (2.5-5 mg total) by mouth every 8 (eight) hours as needed.  Dispense: 20 tablet; Refill: 0  2. Need for influenza vaccination - Flu Vaccine QUAD 36+ mos IM  3. Acute cystitis without hematuria Resolving - complete full course of antibiotics   Halina Maidens, MD [facility]  05/31/2016

## 2016-06-03 ENCOUNTER — Encounter (INDEPENDENT_AMBULATORY_CARE_PROVIDER_SITE_OTHER): Payer: Medicare Other

## 2016-06-03 ENCOUNTER — Telehealth: Payer: Self-pay

## 2016-06-03 DIAGNOSIS — R55 Syncope and collapse: Secondary | ICD-10-CM | POA: Diagnosis not present

## 2016-06-03 NOTE — Telephone Encounter (Signed)
Patient requests refill Levothyroxine sent to [facility].

## 2016-06-04 ENCOUNTER — Other Ambulatory Visit: Payer: Self-pay | Admitting: Internal Medicine

## 2016-06-04 MED ORDER — LEVOTHYROXINE SODIUM 75 MCG PO TABS: 75.0000 ug | ORAL_TABLET | Freq: Every day | ORAL | 5 refills | Status: DC

## 2016-06-07 ENCOUNTER — Inpatient Hospital Stay: Payer: Medicare Other | Attending: Internal Medicine

## 2016-06-08 ENCOUNTER — Inpatient Hospital Stay: Payer: Medicare Other

## 2016-06-15 ENCOUNTER — Encounter (INDEPENDENT_AMBULATORY_CARE_PROVIDER_SITE_OTHER): Payer: Medicare Other | Admitting: Internal Medicine

## 2016-06-15 DIAGNOSIS — J431 Panlobular emphysema: Secondary | ICD-10-CM

## 2016-06-15 DIAGNOSIS — I5022 Chronic systolic (congestive) heart failure: Secondary | ICD-10-CM

## 2016-06-15 DIAGNOSIS — E119 Type 2 diabetes mellitus without complications: Secondary | ICD-10-CM

## 2016-06-15 DIAGNOSIS — S32591A Other specified fracture of right pubis, initial encounter for closed fracture: Secondary | ICD-10-CM | POA: Diagnosis not present

## 2016-06-15 DIAGNOSIS — I4891 Unspecified atrial fibrillation: Secondary | ICD-10-CM

## 2016-06-15 DIAGNOSIS — I10 Essential (primary) hypertension: Secondary | ICD-10-CM

## 2016-06-15 DIAGNOSIS — E039 Hypothyroidism, unspecified: Secondary | ICD-10-CM

## 2016-06-15 NOTE — Progress Notes (Signed)
Received home health orders from [facility]. Orders for new certification with start of care 06/04/16 through 08/02/16. Orders are reviewed, signed, and faxed.

## 2016-06-22 ENCOUNTER — Encounter: Payer: Self-pay | Admitting: Cardiovascular Disease

## 2016-06-29 ENCOUNTER — Encounter: Payer: Self-pay | Admitting: Internal Medicine

## 2016-06-29 ENCOUNTER — Ambulatory Visit
Admission: RE | Admit: 2016-06-29 | Discharge: 2016-06-29 | Disposition: A | Discharge status: Home / Self Care | Payer: Medicare Other | Source: Ambulatory Visit | Attending: Internal Medicine | Admitting: Internal Medicine

## 2016-06-29 ENCOUNTER — Ambulatory Visit (INDEPENDENT_AMBULATORY_CARE_PROVIDER_SITE_OTHER): Payer: Medicare Other | Admitting: Internal Medicine

## 2016-06-29 VITALS — BP 118/77 | HR 67 | Resp 16 | Ht 68.0 in | Wt 168.0 lb

## 2016-06-29 DIAGNOSIS — I25810 Atherosclerosis of coronary artery bypass graft(s) without angina pectoris: Secondary | ICD-10-CM | POA: Diagnosis not present

## 2016-06-29 DIAGNOSIS — S32591D Other specified fracture of right pubis, subsequent encounter for fracture with routine healing: Secondary | ICD-10-CM | POA: Insufficient documentation

## 2016-06-29 DIAGNOSIS — S32591A Other specified fracture of right pubis, initial encounter for closed fracture: Secondary | ICD-10-CM

## 2016-06-29 DIAGNOSIS — C3491 Malignant neoplasm of unspecified part of right bronchus or lung: Secondary | ICD-10-CM | POA: Diagnosis not present

## 2016-06-29 DIAGNOSIS — I480 Paroxysmal atrial fibrillation: Secondary | ICD-10-CM | POA: Diagnosis not present

## 2016-06-29 DIAGNOSIS — E119 Type 2 diabetes mellitus without complications: Secondary | ICD-10-CM | POA: Diagnosis not present

## 2016-06-29 NOTE — Progress Notes (Signed)
Date:  06/29/2016   Name:  Jenny Cooper   DOB:  1936-08-14   MRN:  [id number]   Chief Complaint: Hypertension and Pubic Remi Fracture (PT is coming to the home twice a week and she is doing great. ) Hypertension  This is a chronic problem. The problem is unchanged. The problem is controlled. Associated symptoms include shortness of breath. Pertinent negatives include no chest pain or palpitations. Past treatments include beta blockers and diuretics. The current treatment provides significant improvement.  Diabetes  She presents for her follow-up diabetic visit. She has type 2 diabetes mellitus. Her disease course has been fluctuating. Pertinent negatives for hypoglycemia include no nervousness/anxiousness or tremors. Pertinent negatives for diabetes include no chest pain, no fatigue and no weakness.   Pelvic Fracture - improving.  Still doing PTx.  She is getting around with a walker.  She has intermittent pain in the posterior right buttock and sometimes in the groin.  She is taking only tylenol as needed.  CV - hx MI and Parox Afib.  No recent sx.  No chest pain or dizziness.  No rapid pulse. Seeing Dr. Rockey Situ regularly.  Review of Systems  Constitutional: Negative for chills, fatigue and fever.  Respiratory: Positive for shortness of breath. Negative for cough, chest tightness and wheezing.   Cardiovascular: Positive for leg swelling. Negative for chest pain and palpitations.  Gastrointestinal: Negative for abdominal pain and constipation.  Genitourinary: Positive for pelvic pain. Negative for dysuria and hematuria.  Musculoskeletal: Positive for arthralgias and gait problem.  Neurological: Positive for numbness (intermittently in feet -). Negative for tremors and weakness.  Hematological: Negative for adenopathy.  Psychiatric/Behavioral: Negative for dysphoric mood. The patient is not nervous/anxious.     Patient Active Problem List   Diagnosis Date Noted  . Fracture of  multiple pubic rami, right, closed, initial encounter ([place]) 05/30/2016  . Cancer of hilus of left lung ([place]) 05/25/2016  . Cardiomyopathy ([place]) 04/29/2016  . Type 2 diabetes mellitus with hemoglobin A1c goal of less than 7.0% ([place]) 11/17/2015  . Hx of adenomatous colonic polyps 11/17/2015  . Radiation pneumonitis ([place]) 10/21/2015  . Chronic systolic CHF (congestive heart failure) ([place])   . PAF (paroxysmal atrial fibrillation) ([place])   . Mitral regurgitation   . HLD (hyperlipidemia)   . Paroxysmal supraventricular tachycardia ([place])   . Coronary artery disease involving coronary bypass graft of native heart without angina pectoris   . NSTEMI (non-ST elevated myocardial infarction) ([place])   . Arteriosclerosis of coronary artery 01/11/2015  . Hypothyroidism (acquired) 01/11/2015  . Carcinoma of right lung ([place]) 01/03/2015  . Disorder of peripheral nervous system ([place]) 10/04/2014  . Cervical radiculopathy, chronic 10/04/2014  . Squamous cell carcinoma of lung ([place]) 10/04/2014  . Atrial flutter ([place]) 11/16/2013  . Chronic obstructive pulmonary disease ([place]) 04/24/2012  . Hypertension 08/03/2011  . Artificial cardiac pacemaker 08/03/2011    Prior to Admission medications   Medication Sig Start Date End Date Taking? Authorizing Provider  acetaminophen (TYLENOL) 500 MG tablet Take 500 mg by mouth every 6 (six) hours as needed.   Yes Historical Provider, MD  albuterol (PROVENTIL HFA;VENTOLIN HFA) 108 (90 Base) MCG/ACT inhaler Inhale 2 puffs into the lungs every 6 (six) hours as needed for wheezing or shortness of breath. 02/24/16  Yes Vishal Mungal, MD  apixaban (ELIQUIS) 5 MG TABS tablet Take 5 mg by mouth 2 (two) times daily.   Yes Historical Provider, MD  atorvastatin (LIPITOR) 20 MG tablet Take 1  tablet (20 mg total) by mouth at bedtime. 03/21/16  Yes Glean Hess, MD  Cetirizine HCl 10 MG CAPS Take 1 capsule (10 mg total) by mouth 1 day or 1 dose. 05/19/15  Yes Ryan M Dunn, PA-C  ENTRESTO  24-26 MG TAKE 1 TABLET BY MOUTH TWICE A DAY 05/04/16  Yes Minna Merritts, MD  furosemide (LASIX) 20 MG tablet Take 1 tablet (20 mg total) by mouth daily as needed. Patient taking differently: Take 20 mg by mouth as needed.  02/16/16  Yes Minna Merritts, MD  levothyroxine (SYNTHROID, LEVOTHROID) 75 MCG tablet Take 1 tablet (75 mcg total) by mouth daily before breakfast. 06/04/16  Yes Glean Hess, MD  metoprolol succinate (TOPROL-XL) 25 MG 24 hr tablet TAKE 1 TABLET BY MOUTH EVERY MORNING AND 2 TABS IN THE EVENING 03/29/16  Yes Minna Merritts, MD  omeprazole (PRILOSEC) 20 MG capsule Take 1 capsule (20 mg total) by mouth every morning. 11/03/15  Yes Forest Gleason, MD  oxyCODONE (ROXICODONE) 5 MG immediate release tablet Take 0.5-1 tablets (2.5-5 mg total) by mouth every 8 (eight) hours as needed. 05/31/16 05/31/17 Yes Glean Hess, MD  predniSONE (DELTASONE) 10 MG tablet Take 1 tablet (10 mg total) by mouth daily with breakfast. I tablet starting 05-30-16  Until 06-29-16 05/26/16  Yes Vishal Mungal, MD  predniSONE (DELTASONE) 5 MG tablet Take 1 tablet (5 mg total) by mouth daily with breakfast. 1 tablet starting 06-30-16 until follow up visit 05/26/16  Yes Vishal Mungal, MD  senna (SENOKOT) 8.6 MG TABS tablet Take 1 tablet (8.6 mg total) by mouth daily. 05/27/16  Yes Rudene Re, MD  vitamin B-12 (CYANOCOBALAMIN) 1000 MCG tablet Take 1,000 mcg by mouth daily.   Yes Historical Provider, MD    Allergies  Allergen Reactions  . Lovenox [Enoxaparin Sodium] Itching  . Meperidine Other (See Comments)    Other Reaction: pt does not like how it makes her feel    Past Surgical History:  Procedure Laterality Date  . APPENDECTOMY    . CARDIAC CATHETERIZATION N/A 04/28/2015   Procedure: Left Heart Cath and Coronary Angiography;  Surgeon: Wellington Hampshire, MD;  Location: [facility];  Service: Cardiovascular;  Laterality: N/A;  . COLONOSCOPY  12/2009   2 small tubular adenomas  . CORONARY  ARTERY BYPASS GRAFT  09/2000  . ELECTROMAGNETIC NAVIGATION BROCHOSCOPY N/A 10/06/2015   Procedure: ELECTROMAGNETIC NAVIGATION BRONCHOSCOPY;  Surgeon: Flora Lipps, MD;  Location: [facility];  Service: Cardiopulmonary;  Laterality: N/A;  . ENDOBRONCHIAL ULTRASOUND N/A 10/06/2015   Procedure: ENDOBRONCHIAL ULTRASOUND;  Surgeon: Flora Lipps, MD;  Location: [facility];  Service: Cardiopulmonary;  Laterality: N/A;  . PACEMAKER INSERTION  03/2012  . VAGINAL HYSTERECTOMY     partial - left ovary remains    Social History  Substance Use Topics  . Smoking status: Former Smoker    Packs/day: 2.00    Years: 42.00    Types: Cigarettes    Quit date: 08/30/2000  . Smokeless tobacco: Never Used     Comment: quit smoking in 08/28/2000  . Alcohol use 6.0 oz/week    10 Glasses of wine per week     Comment: 2 glasses of wine per day     Medication list has been reviewed and updated.   Physical Exam  Constitutional: She is oriented to person, place, and time. She appears well-developed. No distress.  HENT:  Head: Normocephalic and atraumatic.  Neck: Normal range of motion. No thyromegaly present.  Cardiovascular: Normal rate, regular rhythm and normal heart sounds.   Pulmonary/Chest: Effort normal and breath sounds normal. No respiratory distress. She has no wheezes.  Musculoskeletal: She exhibits edema (trace ankle edema).       Right hip: She exhibits decreased range of motion.       Lumbar back: She exhibits tenderness.  Neurological: She is alert and oriented to person, place, and time. She has normal strength.  Skin: Skin is warm and dry. No rash noted.  Psychiatric: She has a normal mood and affect. Her speech is normal and behavior is normal. Thought content normal.  Nursing note and vitals reviewed.   BP 118/77   Pulse 67   Resp 16   Ht '5\' 8"'$  (1.727 m)   Wt 168 lb (76.2 kg)   SpO2 98%   BMI 25.54 kg/m   Assessment and Plan: 1. Fracture of multiple pubic rami, right, closed, initial  encounter ([place]) Improving with PTx Will get Xray to demonstrate healing - DG Pelvis Comp Min 3V; Future  2. Type 2 diabetes mellitus with hemoglobin A1c goal of less than 7.0% (HCC) Continue diet control - Hemoglobin A1c  3. Carcinoma of right lung ([place]) Followed by Oncology and Pulmonary Tapering prednisone  4. PAF (paroxysmal atrial fibrillation) ([place]) No recent change in status   Halina Maidens, MD [facility]  06/29/2016

## 2016-06-30 LAB — HEMOGLOBIN A1C
Est. average glucose Bld gHb Est-mCnc: 134 mg/dL
Hgb A1c MFr Bld: 6.3 % — ABNORMAL HIGH (ref 4.8–5.6)

## 2016-07-06 ENCOUNTER — Other Ambulatory Visit: Payer: Self-pay

## 2016-07-06 DIAGNOSIS — R55 Syncope and collapse: Secondary | ICD-10-CM

## 2016-07-24 ENCOUNTER — Other Ambulatory Visit: Payer: Self-pay | Admitting: Cardiovascular Disease

## 2016-07-27 ENCOUNTER — Telehealth: Payer: Self-pay | Admitting: Cardiovascular Disease

## 2016-07-27 NOTE — Telephone Encounter (Signed)
-----   Message from Stana Bunting, RN sent at 07/19/2016  8:33 AM EST ----- Can we set her up an appt to go over her results? She is also due for 6 month f/u. Thank you!

## 2016-07-27 NOTE — Telephone Encounter (Signed)
Lmov for patient to call back and schedule an appointment

## 2016-08-01 ENCOUNTER — Other Ambulatory Visit: Payer: Self-pay | Admitting: Oncology

## 2016-08-02 ENCOUNTER — Ambulatory Visit
Admission: RE | Admit: 2016-08-02 | Discharge: 2016-08-02 | Disposition: A | Discharge status: Home / Self Care | Payer: Medicare Other | Source: Ambulatory Visit | Attending: Radiation Oncology | Admitting: Radiation Oncology

## 2016-08-02 ENCOUNTER — Encounter: Payer: Self-pay | Admitting: Radiation Oncology

## 2016-08-02 ENCOUNTER — Inpatient Hospital Stay: Payer: Medicare Other | Attending: Internal Medicine

## 2016-08-02 VITALS — BP 124/68 | HR 70 | Temp 96.7°F | Resp 22 | Wt 171.3 lb

## 2016-08-02 DIAGNOSIS — I480 Paroxysmal atrial fibrillation: Secondary | ICD-10-CM | POA: Insufficient documentation

## 2016-08-02 DIAGNOSIS — R918 Other nonspecific abnormal finding of lung field: Secondary | ICD-10-CM | POA: Insufficient documentation

## 2016-08-02 DIAGNOSIS — I110 Hypertensive heart disease with heart failure: Secondary | ICD-10-CM | POA: Insufficient documentation

## 2016-08-02 DIAGNOSIS — Z800 Family history of malignant neoplasm of digestive organs: Secondary | ICD-10-CM | POA: Insufficient documentation

## 2016-08-02 DIAGNOSIS — I4892 Unspecified atrial flutter: Secondary | ICD-10-CM | POA: Insufficient documentation

## 2016-08-02 DIAGNOSIS — I499 Cardiac arrhythmia, unspecified: Secondary | ICD-10-CM | POA: Insufficient documentation

## 2016-08-02 DIAGNOSIS — C3402 Malignant neoplasm of left main bronchus: Secondary | ICD-10-CM | POA: Insufficient documentation

## 2016-08-02 DIAGNOSIS — Z923 Personal history of irradiation: Secondary | ICD-10-CM | POA: Insufficient documentation

## 2016-08-02 DIAGNOSIS — Z951 Presence of aortocoronary bypass graft: Secondary | ICD-10-CM | POA: Insufficient documentation

## 2016-08-02 DIAGNOSIS — Z9181 History of falling: Secondary | ICD-10-CM | POA: Diagnosis not present

## 2016-08-02 DIAGNOSIS — I252 Old myocardial infarction: Secondary | ICD-10-CM | POA: Insufficient documentation

## 2016-08-02 DIAGNOSIS — Y842 Radiological procedure and radiotherapy as the cause of abnormal reaction of the patient, or of later complication, without mention of misadventure at the time of the procedure: Secondary | ICD-10-CM | POA: Insufficient documentation

## 2016-08-02 DIAGNOSIS — Z7952 Long term (current) use of systemic steroids: Secondary | ICD-10-CM | POA: Diagnosis not present

## 2016-08-02 DIAGNOSIS — I509 Heart failure, unspecified: Secondary | ICD-10-CM | POA: Insufficient documentation

## 2016-08-02 DIAGNOSIS — Z8781 Personal history of (healed) traumatic fracture: Secondary | ICD-10-CM | POA: Insufficient documentation

## 2016-08-02 DIAGNOSIS — C3491 Malignant neoplasm of unspecified part of right bronchus or lung: Secondary | ICD-10-CM

## 2016-08-02 DIAGNOSIS — J449 Chronic obstructive pulmonary disease, unspecified: Secondary | ICD-10-CM | POA: Insufficient documentation

## 2016-08-02 DIAGNOSIS — Z79899 Other long term (current) drug therapy: Secondary | ICD-10-CM | POA: Insufficient documentation

## 2016-08-02 DIAGNOSIS — C3401 Malignant neoplasm of right main bronchus: Secondary | ICD-10-CM | POA: Insufficient documentation

## 2016-08-02 DIAGNOSIS — I2510 Atherosclerotic heart disease of native coronary artery without angina pectoris: Secondary | ICD-10-CM | POA: Diagnosis not present

## 2016-08-02 DIAGNOSIS — Z85118 Personal history of other malignant neoplasm of bronchus and lung: Secondary | ICD-10-CM | POA: Insufficient documentation

## 2016-08-02 DIAGNOSIS — Z950 Presence of cardiac pacemaker: Secondary | ICD-10-CM | POA: Insufficient documentation

## 2016-08-02 DIAGNOSIS — Z7901 Long term (current) use of anticoagulants: Secondary | ICD-10-CM | POA: Insufficient documentation

## 2016-08-02 DIAGNOSIS — J700 Acute pulmonary manifestations due to radiation: Secondary | ICD-10-CM | POA: Insufficient documentation

## 2016-08-02 DIAGNOSIS — Z87891 Personal history of nicotine dependence: Secondary | ICD-10-CM | POA: Insufficient documentation

## 2016-08-02 DIAGNOSIS — K219 Gastro-esophageal reflux disease without esophagitis: Secondary | ICD-10-CM | POA: Insufficient documentation

## 2016-08-02 DIAGNOSIS — Z9221 Personal history of antineoplastic chemotherapy: Secondary | ICD-10-CM | POA: Insufficient documentation

## 2016-08-02 DIAGNOSIS — Z452 Encounter for adjustment and management of vascular access device: Secondary | ICD-10-CM | POA: Diagnosis not present

## 2016-08-02 MED ORDER — HEPARIN SOD (PORK) LOCK FLUSH 100 UNIT/ML IV SOLN
INTRAVENOUS | Status: AC
  Filled 2016-08-02: qty 5

## 2016-08-02 MED ORDER — HEPARIN SOD (PORK) LOCK FLUSH 100 UNIT/ML IV SOLN
500.0000 [IU] | Freq: Once | INTRAVENOUS | Status: AC
  Administered 2016-08-02: 500 [IU] via INTRAVENOUS

## 2016-08-02 MED ORDER — SODIUM CHLORIDE 0.9% FLUSH
10.0000 mL | Freq: Once | INTRAVENOUS | Status: AC
  Administered 2016-08-02: 10 mL via INTRAVENOUS
  Filled 2016-08-02: qty 10

## 2016-08-02 NOTE — Progress Notes (Signed)
Radiation Oncology Follow up Note  Name: Jenny Cooper   Date:   08/02/2016 MRN:  [id number] DOB: [birth year]    This 80 y.o. 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  REFERRING PROVIDER: Gayland Curry, MD  HPI: Patient is a 80 year old female now out a year and a half having completed combined modality treatment with chemotherapy and radiation therapy for left hilum stage IIIa squamous cell carcinoma. She seen today in routine follow-up is doing fairly well. She's had I ablation of her heart arrhythmia in the recent last year as well as fraction her pelvis on a traumatic fall. She specifically denies cough hemoptysis or chest tightness.. She had a CT scan of her chest back in September showing nondisplaced fractures of right superior inferior rami stable right age radiation changes in the right lung hilum and a new 1.5 cm area of density in the left lower lobe. That is being followed up with a CT scan in the next 2 months.  COMPLICATIONS OF TREATMENT: none  FOLLOW UP COMPLIANCE: keeps appointments   PHYSICAL EXAM:  BP 124/68   Pulse 70   Temp (!) 96.7 F (35.9 C)   Resp (!) 22   Wt 171 lb 4.8 oz (77.7 kg)   BMI 26.05 kg/m  Well-developed well-nourished patient in NAD. HEENT reveals PERLA, EOMI, discs not visualized.  Oral cavity is clear. No oral mucosal lesions are identified. Neck is clear without evidence of cervical or supraclavicular adenopathy. Lungs are clear to A&P. Cardiac examination is essentially unremarkable with regular rate and rhythm without murmur rub or thrill. Abdomen is benign with no organomegaly or masses noted. Motor sensory and DTR levels are equal and symmetric in the upper and lower extremities. Cranial nerves II through XII are grossly intact. Proprioception is intact. No peripheral adenopathy or edema is identified. No motor or  sensory levels are noted. Crude visual fields are within normal range.  RADIOLOGY RESULTS: Most recent CT scans reviewed and compatible with the above-stated findings  PLAN: At the present time she is doing well. Will follow-up on her new lesion the left lower lobe which certainly may be infectious in nature and may resolve over time. We can always offer SB RT to that area should be proven malignancy. I've otherwise asked to see her back in 6 months for follow-up. I've asked her copy me a report of her recent CT scan when it is performed. Otherwise patient knows to call with any concerns.  I would like to take this opportunity to thank you for allowing me to participate in the care of your patient.Armstead Peaks., MD

## 2016-08-03 ENCOUNTER — Inpatient Hospital Stay: Payer: Medicare Other

## 2016-08-04 ENCOUNTER — Other Ambulatory Visit: Payer: Self-pay | Admitting: Cardiovascular Disease

## 2016-08-06 DIAGNOSIS — M4850XA Collapsed vertebra, not elsewhere classified, site unspecified, initial encounter for fracture: Secondary | ICD-10-CM

## 2016-08-06 HISTORY — DX: Collapsed vertebra, not elsewhere classified, site unspecified, initial encounter for fracture: M48.50XA

## 2016-08-13 ENCOUNTER — Ambulatory Visit
Admission: RE | Admit: 2016-08-13 | Discharge: 2016-08-13 | Disposition: A | Discharge status: Home / Self Care | Payer: Medicare Other | Source: Ambulatory Visit | Attending: Internal Medicine | Admitting: Internal Medicine

## 2016-08-13 ENCOUNTER — Ambulatory Visit (INDEPENDENT_AMBULATORY_CARE_PROVIDER_SITE_OTHER): Payer: Medicare Other | Admitting: Internal Medicine

## 2016-08-13 ENCOUNTER — Other Ambulatory Visit: Payer: Self-pay | Admitting: Internal Medicine

## 2016-08-13 VITALS — BP 112/86 | HR 88 | Temp 97.4°F | Ht 68.0 in | Wt 169.0 lb

## 2016-08-13 DIAGNOSIS — M545 Low back pain, unspecified: Secondary | ICD-10-CM

## 2016-08-13 DIAGNOSIS — I25810 Atherosclerosis of coronary artery bypass graft(s) without angina pectoris: Secondary | ICD-10-CM | POA: Diagnosis not present

## 2016-08-13 DIAGNOSIS — R937 Abnormal findings on diagnostic imaging of other parts of musculoskeletal system: Secondary | ICD-10-CM | POA: Diagnosis not present

## 2016-08-13 DIAGNOSIS — N3001 Acute cystitis with hematuria: Secondary | ICD-10-CM

## 2016-08-13 HISTORY — DX: Low back pain, unspecified: M54.50

## 2016-08-13 LAB — POCT URINALYSIS DIPSTICK
BILIRUBIN UA: NEGATIVE
Glucose, UA: NEGATIVE
KETONES UA: NEGATIVE
Nitrite, UA: POSITIVE
PH UA: 5
PROTEIN UA: NEGATIVE
Spec Grav, UA: 1.02
Urobilinogen, UA: 0.2

## 2016-08-13 MED ORDER — OXYCODONE HCL 5 MG PO TABS: 5.0000 mg | ORAL_TABLET | Freq: Three times a day (TID) | ORAL | 0 refills | Status: DC | PRN

## 2016-08-13 MED ORDER — SULFAMETHOXAZOLE-TRIMETHOPRIM 800-160 MG PO TABS: 1.0000 | ORAL_TABLET | Freq: Two times a day (BID) | ORAL | 0 refills | Status: DC

## 2016-08-13 NOTE — Progress Notes (Signed)
Date:  08/13/2016   Name:  Jenny Cooper   DOB:  [birth year]   MRN:  [id number]   Chief Complaint: Urinary Tract Infection and Back Pain Urinary Tract Infection   This is a new problem. The current episode started in the past 7 days. The problem occurs every urination. The problem has been unchanged. The patient is experiencing no pain. Associated symptoms include flank pain. Pertinent negatives include no chills, hematuria or urgency.  Back Pain  This is a new problem. The current episode started in the past 7 days. The problem occurs constantly. The problem has been gradually worsening since onset. The pain is present in the lumbar spine. The pain does not radiate. The symptoms are aggravated by lying down and standing. Associated symptoms include abdominal pain, dysuria and weakness. Pertinent negatives include no chest pain, fever, headaches or numbness.  Back pain acute last week while standing at the sink brushing her teeth.  Pain sharp and burning at times, other times deep aching.  Only thing that has helped is ice, sitting and oxycodone 5 mg.    Review of Systems  Constitutional: Negative for chills, fatigue and fever.  Respiratory: Negative for cough, chest tightness and shortness of breath.   Cardiovascular: Negative for chest pain and palpitations.  Gastrointestinal: Positive for abdominal pain.  Genitourinary: Positive for dysuria and flank pain. Negative for hematuria and urgency.  Musculoskeletal: Positive for back pain and myalgias.  Neurological: Positive for weakness. Negative for numbness and headaches.    Patient Active Problem List   Diagnosis Date Noted  . Fracture of multiple pubic rami, right, closed, initial encounter ([place]) 05/30/2016  . Cancer of hilus of left lung ([place]) 05/25/2016  . Cardiomyopathy ([place]) 04/29/2016  . Type 2 diabetes mellitus with hemoglobin A1c goal of less than 7.0% ([place]) 11/17/2015  . Hx of adenomatous colonic polyps 11/17/2015    . Radiation pneumonitis ([place]) 10/21/2015  . Chronic systolic CHF (congestive heart failure) ([place])   . PAF (paroxysmal atrial fibrillation) ([place])   . Mitral regurgitation   . HLD (hyperlipidemia)   . Paroxysmal supraventricular tachycardia ([place])   . Coronary artery disease involving coronary bypass graft of native heart without angina pectoris   . NSTEMI (non-ST elevated myocardial infarction) ([place])   . Arteriosclerosis of coronary artery 01/11/2015  . Hypothyroidism (acquired) 01/11/2015  . Carcinoma of right lung ([place]) 01/03/2015  . Disorder of peripheral nervous system ([place]) 10/04/2014  . Cervical radiculopathy, chronic 10/04/2014  . Squamous cell carcinoma of lung ([place]) 10/04/2014  . Atrial flutter ([place]) 11/16/2013  . Chronic obstructive pulmonary disease ([place]) 04/24/2012  . Hypertension 08/03/2011  . Artificial cardiac pacemaker 08/03/2011    Prior to Admission medications   Medication Sig Start Date End Date Taking? Authorizing Provider  apixaban (ELIQUIS) 5 MG TABS tablet Take by mouth. 08/04/16  Yes Historical Provider, MD  acetaminophen (TYLENOL) 500 MG tablet Take 500 mg by mouth every 6 (six) hours as needed.    Historical Provider, MD  albuterol (PROVENTIL HFA;VENTOLIN HFA) 108 (90 Base) MCG/ACT inhaler Inhale 2 puffs into the lungs every 6 (six) hours as needed for wheezing or shortness of breath. Patient not taking: Reported on 08/02/2016 02/24/16   Vilinda Boehringer, MD  apixaban (ELIQUIS) 5 MG TABS tablet Take 5 mg by mouth 2 (two) times daily.    Historical Provider, MD  atorvastatin (LIPITOR) 20 MG tablet Take 1 tablet (20 mg total) by mouth at bedtime. 03/21/16   Glean Hess,  MD  Cetirizine HCl 10 MG CAPS Take 1 capsule (10 mg total) by mouth 1 day or 1 dose. 05/19/15   Ryan M Dunn, PA-C  ENTRESTO 24-26 MG TAKE 1 TABLET BY MOUTH TWICE A DAY 05/04/16   Minna Merritts, MD  furosemide (LASIX) 20 MG tablet Take 1 tablet (20 mg total) by mouth daily as needed. Patient taking  differently: Take 20 mg by mouth as needed.  02/16/16   Minna Merritts, MD  levothyroxine (SYNTHROID, LEVOTHROID) 75 MCG tablet Take 1 tablet (75 mcg total) by mouth daily before breakfast. 06/04/16   Glean Hess, MD  metoprolol succinate (TOPROL-XL) 25 MG 24 hr tablet TAKE 1 TABLET BY MOUTH EVERY MORNING AND 2 TABS IN THE EVENING 07/27/16   Minna Merritts, MD  omeprazole (PRILOSEC) 20 MG capsule TAKE 1 CAPSULE (20 MG TOTAL) BY MOUTH EVERY MORNING. 08/02/16   Cammie Sickle, MD  oxyCODONE (ROXICODONE) 5 MG immediate release tablet Take 0.5-1 tablets (2.5-5 mg total) by mouth every 8 (eight) hours as needed. Patient not taking: Reported on 08/02/2016 05/31/16 05/31/17  Glean Hess, MD  predniSONE (DELTASONE) 10 MG tablet Take 1 tablet (10 mg total) by mouth daily with breakfast. I tablet starting 05-30-16  Until 06-29-16 Patient not taking: Reported on 08/02/2016 05/26/16   Vilinda Boehringer, MD  predniSONE (DELTASONE) 5 MG tablet Take 1 tablet (5 mg total) by mouth daily with breakfast. 1 tablet starting 06-30-16 until follow up visit 05/26/16   Vilinda Boehringer, MD  senna (SENOKOT) 8.6 MG TABS tablet Take 1 tablet (8.6 mg total) by mouth daily. 05/27/16   Rudene Re, MD  vitamin B-12 (CYANOCOBALAMIN) 1000 MCG tablet Take 1,000 mcg by mouth daily.    Historical Provider, MD    Allergies  Allergen Reactions  . Lovenox [Enoxaparin Sodium] Itching  . Meperidine Other (See Comments)    Other Reaction: pt does not like how it makes her feel    Past Surgical History:  Procedure Laterality Date  . APPENDECTOMY    . CARDIAC CATHETERIZATION N/A 04/28/2015   Procedure: Left Heart Cath and Coronary Angiography;  Surgeon: Wellington Hampshire, MD;  Location: [place] [facility];  Service: Cardiovascular;  Laterality: N/A;  . COLONOSCOPY  12/2009   2 small tubular adenomas  . CORONARY ARTERY BYPASS GRAFT  09/2000  . ELECTROMAGNETIC NAVIGATION BROCHOSCOPY N/A 10/06/2015   Procedure: ELECTROMAGNETIC  NAVIGATION BRONCHOSCOPY;  Surgeon: Flora Lipps, MD;  Location: [facility] OR[facility];  Service: Cardiopulmonary;  Laterality: N/A;  . ENDOBRONCHIAL ULTRASOUND N/A 10/06/2015   Procedure: ENDOBRONCHIAL ULTRASOUND;  Surgeon: Flora Lipps, MD;  Location: [facility] ORS;  Service: Cardiopulmonary;  Laterality: N/A;  . PACEMAKER INSERTION  03/2012  . VAGINAL HYSTERECTOMY     partial - left ovary remains    Social History  Substance Use Topics  . Smoking status: Former Smoker    Packs/day: 2.00    Years: 42.00    Types: Cigarettes    Quit date: 08/30/2000  . Smokeless tobacco: Never Used     Comment: quit smoking in 08/28/2000  . Alcohol use 6.0 oz/week    10 Glasses of wine per week     Comment: 2 glasses of wine per day     Medication list has been reviewed and updated.   Physical Exam  Constitutional: She appears well-developed and well-nourished. No distress (seen sitting in wheel chair).  Cardiovascular: Normal rate, regular rhythm and normal heart sounds.   Pulmonary/Chest: Effort normal and breath sounds  normal. No respiratory distress.  Abdominal: Soft. Bowel sounds are normal. There is tenderness in the suprapubic area. There is no rebound, no guarding and no CVA tenderness.  Musculoskeletal:       Lumbar back: She exhibits tenderness and bony tenderness. She exhibits no spasm.  Psychiatric: She has a normal mood and affect.  Nursing note and vitals reviewed.   BP 112/86   Pulse 88   Temp 97.4 F (36.3 C)   Ht '5\' 8"'$  (1.727 m)   Wt 169 lb (76.7 kg)   SpO2 98%   BMI 25.70 kg/m   Assessment and Plan: 1. Acute cystitis with hematuria Will treat empirically until culture returns - POCT urinalysis dipstick - Urine culture - sulfamethoxazole-trimethoprim (BACTRIM DS,SEPTRA DS) 800-160 MG tablet; Take 1 tablet by mouth 2 (two) times daily.  Dispense: 20 tablet; Refill: 0  2. Acute midline low back pain without sciatica Suspect compression fracture May need MRI and consideration of  kyphoplasty - DG Lumbar Spine Complete; Future - oxyCODONE (ROXICODONE) 5 MG immediate release tablet; Take 1 tablet (5 mg total) by mouth every 8 (eight) hours as needed.  Dispense: 30 tablet; Refill: 0   Halina Maidens, MD [facility]  08/13/2016

## 2016-08-16 ENCOUNTER — Inpatient Hospital Stay: Payer: Medicare Other

## 2016-08-16 ENCOUNTER — Ambulatory Visit
Admission: RE | Admit: 2016-08-16 | Discharge: 2016-08-16 | Disposition: A | Discharge status: Home / Self Care | Payer: Medicare Other | Source: Ambulatory Visit | Attending: Internal Medicine | Admitting: Internal Medicine

## 2016-08-16 ENCOUNTER — Other Ambulatory Visit: Payer: Self-pay

## 2016-08-16 DIAGNOSIS — J439 Emphysema, unspecified: Secondary | ICD-10-CM | POA: Insufficient documentation

## 2016-08-16 DIAGNOSIS — Z951 Presence of aortocoronary bypass graft: Secondary | ICD-10-CM | POA: Insufficient documentation

## 2016-08-16 DIAGNOSIS — M4856XA Collapsed vertebra, not elsewhere classified, lumbar region, initial encounter for fracture: Secondary | ICD-10-CM | POA: Insufficient documentation

## 2016-08-16 DIAGNOSIS — I2510 Atherosclerotic heart disease of native coronary artery without angina pectoris: Secondary | ICD-10-CM | POA: Diagnosis not present

## 2016-08-16 DIAGNOSIS — I700 Atherosclerosis of aorta: Secondary | ICD-10-CM | POA: Insufficient documentation

## 2016-08-16 DIAGNOSIS — J701 Chronic and other pulmonary manifestations due to radiation: Secondary | ICD-10-CM | POA: Insufficient documentation

## 2016-08-16 DIAGNOSIS — C3402 Malignant neoplasm of left main bronchus: Secondary | ICD-10-CM | POA: Insufficient documentation

## 2016-08-16 LAB — COMPREHENSIVE METABOLIC PANEL
ALT: 11 U/L — ABNORMAL LOW (ref 14–54)
AST: 18 U/L (ref 15–41)
Albumin: 3.8 g/dL (ref 3.5–5.0)
Alkaline Phosphatase: 78 U/L (ref 38–126)
Anion gap: 8 (ref 5–15)
BILIRUBIN TOTAL: 0.4 mg/dL (ref 0.3–1.2)
BUN: 17 mg/dL (ref 6–20)
CHLORIDE: 101 mmol/L (ref 101–111)
CO2: 27 mmol/L (ref 22–32)
Calcium: 9.3 mg/dL (ref 8.9–10.3)
Creatinine, Ser: 1.32 mg/dL — ABNORMAL HIGH (ref 0.44–1.00)
GFR, EST AFRICAN AMERICAN: 44 mL/min — AB (ref >60)
GFR, EST NON AFRICAN AMERICAN: 38 mL/min — AB (ref >60)
Glucose, Bld: 103 mg/dL — ABNORMAL HIGH (ref 65–99)
POTASSIUM: 4.5 mmol/L (ref 3.5–5.1)
Sodium: 136 mmol/L (ref 135–145)
TOTAL PROTEIN: 7.2 g/dL (ref 6.5–8.1)

## 2016-08-16 LAB — CBC WITH DIFFERENTIAL/PLATELET
Basophils Absolute: 0.1 10*3/uL (ref 0–0.1)
Basophils Relative: 1 %
EOS PCT: 9 %
Eosinophils Absolute: 0.6 10*3/uL (ref 0–0.7)
HEMATOCRIT: 38.8 % (ref 35.0–47.0)
Hemoglobin: 12.6 g/dL (ref 12.0–16.0)
LYMPHS PCT: 10 %
Lymphs Abs: 0.7 10*3/uL — ABNORMAL LOW (ref 1.0–3.6)
MCH: 30.1 pg (ref 26.0–34.0)
MCHC: 32.5 g/dL (ref 32.0–36.0)
MCV: 92.5 fL (ref 80.0–100.0)
MONO ABS: 0.6 10*3/uL (ref 0.2–0.9)
MONOS PCT: 9 %
NEUTROS ABS: 5 10*3/uL (ref 1.4–6.5)
Neutrophils Relative %: 71 %
PLATELETS: 221 10*3/uL (ref 150–440)
RBC: 4.2 MIL/uL (ref 3.80–5.20)
RDW: 16.1 % — AB (ref 11.5–14.5)
WBC: 7 10*3/uL (ref 3.6–11.0)

## 2016-08-16 MED ORDER — IOPAMIDOL (ISOVUE-300) INJECTION 61%
75.0000 mL | Freq: Once | INTRAVENOUS | Status: AC | PRN
  Administered 2016-08-16: 60 mL via INTRAVENOUS

## 2016-08-17 ENCOUNTER — Other Ambulatory Visit: Payer: Medicare Other

## 2016-08-17 ENCOUNTER — Inpatient Hospital Stay (HOSPITAL_BASED_OUTPATIENT_CLINIC_OR_DEPARTMENT_OTHER): Payer: Medicare Other | Admitting: Internal Medicine

## 2016-08-17 VITALS — BP 107/70 | HR 61 | Temp 97.2°F | Wt 170.5 lb

## 2016-08-17 DIAGNOSIS — Z9221 Personal history of antineoplastic chemotherapy: Secondary | ICD-10-CM | POA: Diagnosis not present

## 2016-08-17 DIAGNOSIS — Z79899 Other long term (current) drug therapy: Secondary | ICD-10-CM

## 2016-08-17 DIAGNOSIS — Z7901 Long term (current) use of anticoagulants: Secondary | ICD-10-CM

## 2016-08-17 DIAGNOSIS — Z85118 Personal history of other malignant neoplasm of bronchus and lung: Secondary | ICD-10-CM

## 2016-08-17 DIAGNOSIS — Z7952 Long term (current) use of systemic steroids: Secondary | ICD-10-CM

## 2016-08-17 DIAGNOSIS — I110 Hypertensive heart disease with heart failure: Secondary | ICD-10-CM

## 2016-08-17 DIAGNOSIS — C3402 Malignant neoplasm of left main bronchus: Secondary | ICD-10-CM

## 2016-08-17 DIAGNOSIS — I2510 Atherosclerotic heart disease of native coronary artery without angina pectoris: Secondary | ICD-10-CM

## 2016-08-17 DIAGNOSIS — I4892 Unspecified atrial flutter: Secondary | ICD-10-CM

## 2016-08-17 DIAGNOSIS — J449 Chronic obstructive pulmonary disease, unspecified: Secondary | ICD-10-CM

## 2016-08-17 DIAGNOSIS — Z800 Family history of malignant neoplasm of digestive organs: Secondary | ICD-10-CM

## 2016-08-17 DIAGNOSIS — Z951 Presence of aortocoronary bypass graft: Secondary | ICD-10-CM

## 2016-08-17 DIAGNOSIS — I480 Paroxysmal atrial fibrillation: Secondary | ICD-10-CM

## 2016-08-17 DIAGNOSIS — K219 Gastro-esophageal reflux disease without esophagitis: Secondary | ICD-10-CM

## 2016-08-17 DIAGNOSIS — I509 Heart failure, unspecified: Secondary | ICD-10-CM

## 2016-08-17 DIAGNOSIS — J700 Acute pulmonary manifestations due to radiation: Secondary | ICD-10-CM

## 2016-08-17 DIAGNOSIS — Y842 Radiological procedure and radiotherapy as the cause of abnormal reaction of the patient, or of later complication, without mention of misadventure at the time of the procedure: Secondary | ICD-10-CM

## 2016-08-17 DIAGNOSIS — Z87891 Personal history of nicotine dependence: Secondary | ICD-10-CM

## 2016-08-17 DIAGNOSIS — I252 Old myocardial infarction: Secondary | ICD-10-CM

## 2016-08-17 DIAGNOSIS — Z923 Personal history of irradiation: Secondary | ICD-10-CM

## 2016-08-17 DIAGNOSIS — Z950 Presence of cardiac pacemaker: Secondary | ICD-10-CM

## 2016-08-17 LAB — URINE CULTURE

## 2016-08-17 NOTE — Progress Notes (Signed)
[facility] OFFICE PROGRESS NOTE  Patient Care Team: Glean Hess, MD as PCP - General (Family Medicine) Minna Merritts, MD as Consulting Physician (Cardiology) Vilinda Boehringer, MD as Consulting Physician (Pulmonary Disease) Cammie Sickle, MD as Consulting Physician (Internal Medicine)  Carcinoma of right lung [facility])   Staging form: Lung, AJCC 7th Edition   - Clinical: Stage IIIA (T4, N1, M0) - Unsigned   Oncology History   JAN 2016-  IIIa squamous cell carcinoma of the right lung hilum. Biopsy from hilar area and lymph node station 4R was positive for squamous cell carcinoma.  Patient had compression of the right mainstem bronchus because of enlarged lymph node and a mass; [ T4 N1 M0 tumor stage IIIa ].  2. Started on radiation and chemotherapy from October 21, 2014 3. Finished 6 cycles of carboplatinum and Taxol  in March  29 th of 2016,  PET scan shows significant response  4. Started on consolidation chemotherapy.  Patient finished 2 cycles on July 2016 of carboplatin and Taxol. 5. Atrial fibrillation diagnosis in August of 2016 on eloquis 6. Repeat bronchoscopy was negative for any malignancy  In February 2017.  # SEP 7th 2017- CT [facility]- 5cm hilar mass; bil Ground glass opacities.   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  female pleasant patient above history of Stage III squamous cell lung cancer status post chemoradiation; Here to review the results of the restaging CAT scan.  Patient continues on prednisone 5 mg a day. Otherwise denies any worsening shortness of breath. Mild chronic cough. No fever no chills. She is awaiting a follow-up appointment with pulmonary next month.  Denies any  difficulty swallowing or pain with swallowing.  REVIEW OF SYSTEMS:  A complete 10 point review of system is done which is negative except mentioned above/history of present illness.   PAST MEDICAL HISTORY :  Past Medical History:  Diagnosis Date  . Atypical atrial flutter ([place])    a. s/p ablation 07/27/2013 followed by Dr. Rockey Situ  . CAD (coronary artery disease)    a. s/p MI x 2 in 2002 s/p PCI x 2 in 2002; b. s/p 2v CABG 2002; c. stress echo 07/2004 w/ evi of pos & inf infarct & no evi of ischemia; d. 4/08 dipyridamole scan w/ multiple areas of infarct, no ischemia, EF 49%; e. cath 04/28/15 3v CAD, med Rx rec, no targets for revasc, LM lum irregs, pLAD 30%, 100%, ost-pLCx 60%, [name] 99%, OM2 100%, p-mRCA 90%, [name] 100% L-R collats, VG-mLAD irregs, VG-OM2 oc  . Carcinoma of right lung ([place]) 01/03/2015   a. followed by Dr. Oliva Bustard  . Chronic systolic CHF (congestive heart failure) ([place])    a. echo 03/2015: EF 30-35%, sev ant/inf/pos HK, in mild to mod MR  . COPD (chronic obstructive pulmonary disease) ([place])   . GERD (gastroesophageal reflux disease)   . History of blood clots    12/2001  . History of colonoscopy 2013  . History of mammography, screening 2015  . History of Papanicolaou smear of cervix 2013  . HLD (hyperlipidemia)   . HTN (hypertension)   . Hypothyroidism   . Lung cancer ([place])   .  Mitral regurgitation    a. s/p mitral ring placement 09/2000; b. echo 09/2010: EF 50%, inf HK, post AK, mild MR, prosthetic mitral valve ring w/ peak gradient of 10 mmHg; b. echo 2/13: EF 50%, mild MR/TR     . Myocardial infarction    X 2 (LAST ONE IN 2002)  . Neuropathy ([place])   . Pacemaker    a. MDT 2002; b. generator replacement 2013; c. followed by Dr. Omelia Blackwater, MD  . PAF (paroxysmal atrial fibrillation) [facility])    a. on Eliquis     PAST SURGICAL HISTORY :   Past Surgical History:  Procedure Laterality Date  . APPENDECTOMY    . CARDIAC CATHETERIZATION N/A 04/28/2015   Procedure: Left Heart  Cath and Coronary Angiography;  Surgeon: Wellington Hampshire, MD;  Location: [facility];  Service: Cardiovascular;  Laterality: N/A;  . COLONOSCOPY  12/2009   2 small tubular adenomas  . CORONARY ARTERY BYPASS GRAFT  09/2000  . ELECTROMAGNETIC NAVIGATION BROCHOSCOPY N/A 10/06/2015   Procedure: ELECTROMAGNETIC NAVIGATION BRONCHOSCOPY;  Surgeon: Flora Lipps, MD;  Location: [facility] ORS;  Service: Cardiopulmonary;  Laterality: N/A;  . ENDOBRONCHIAL ULTRASOUND N/A 10/06/2015   Procedure: ENDOBRONCHIAL ULTRASOUND;  Surgeon: Flora Lipps, MD;  Location: [facility] ORS;  Service: Cardiopulmonary;  Laterality: N/A;  . PACEMAKER INSERTION  03/2012  . VAGINAL HYSTERECTOMY     partial - left ovary remains    FAMILY HISTORY :   Family History  Problem Relation Age of Onset  . COPD Mother     sister, and brother  . Stroke Maternal Grandmother   . Hypertension Sister   . Hypertension Brother   . Colon cancer Brother     age 31  . Heart attack Neg Hx     SOCIAL HISTORY:   Social History  Substance Use Topics  . Smoking status: Former Smoker    Packs/day: 2.00    Years: 42.00    Types: Cigarettes    Quit date: 08/30/2000  . Smokeless tobacco: Never Used     Comment: quit smoking in 08/28/2000  . Alcohol use 6.0 oz/week    10 Glasses of wine per week     Comment: 2 glasses of wine per day    ALLERGIES:  is allergic to lovenox [enoxaparin sodium] and meperidine.  MEDICATIONS:  Current Outpatient Prescriptions  Medication Sig Dispense Refill  . acetaminophen (TYLENOL) 500 MG tablet Take 500 mg by mouth every 6 (six) hours as needed.    Marland Kitchen apixaban (ELIQUIS) 5 MG TABS tablet Take 5 mg by mouth 2 (two) times daily.    Marland Kitchen atorvastatin (LIPITOR) 20 MG tablet Take 1 tablet (20 mg total) by mouth at bedtime. 30 tablet 5  . Calcium Carb-Cholecalciferol (CALCIUM 600-D PO) Take by mouth.    . Cetirizine HCl 10 MG CAPS Take 1 capsule (10 mg total) by mouth 1 day or 1 dose. 30 capsule 5  . ENTRESTO 24-26 MG  TAKE 1 TABLET BY MOUTH TWICE A DAY 60 tablet 3  . furosemide (LASIX) 20 MG tablet Take 1 tablet (20 mg total) by mouth daily as needed. (Patient taking differently: Take 20 mg by mouth as needed. ) 30 tablet 3  . levothyroxine (SYNTHROID, LEVOTHROID) 75 MCG tablet Take 1 tablet (75 mcg total) by mouth daily before breakfast. 30 tablet 5  . metoprolol succinate (TOPROL-XL) 25 MG 24 hr tablet TAKE 1 TABLET BY MOUTH EVERY MORNING AND 2 TABS IN THE EVENING 90 tablet 3  . omeprazole (  PRILOSEC) 20 MG capsule TAKE 1 CAPSULE (20 MG TOTAL) BY MOUTH EVERY MORNING. 30 capsule 6  . oxyCODONE (ROXICODONE) 5 MG immediate release tablet Take 1 tablet (5 mg total) by mouth every 8 (eight) hours as needed. 30 tablet 0  . predniSONE (DELTASONE) 5 MG tablet Take 1 tablet (5 mg total) by mouth daily with breakfast. 1 tablet starting 06-30-16 until follow up visit 60 tablet 0  . senna (SENOKOT) 8.6 MG TABS tablet Take 1 tablet (8.6 mg total) by mouth daily. 30 each 0  . sulfamethoxazole-trimethoprim (BACTRIM DS,SEPTRA DS) 800-160 MG tablet Take 1 tablet by mouth 2 (two) times daily. 20 tablet 0  . vitamin B-12 (CYANOCOBALAMIN) 1000 MCG tablet Take 1,000 mcg by mouth daily.    Marland Kitchen albuterol (PROVENTIL HFA;VENTOLIN HFA) 108 (90 Base) MCG/ACT inhaler Inhale 2 puffs into the lungs every 6 (six) hours as needed for wheezing or shortness of breath. (Patient not taking: Reported on 08/17/2016) 1 Inhaler 2   No current facility-administered medications for this visit.    Facility-Administered Medications Ordered in Other Visits  Medication Dose Route Frequency Provider Last Rate Last Dose  . sodium chloride 0.9 % injection 10 mL  10 mL Intravenous PRN Forest Gleason, MD   10 mL at 02/19/15 1000  . sodium chloride flush (NS) 0.9 % injection 10 mL  10 mL Intravenous PRN Cammie Sickle, MD   10 mL at 02/16/16 1048    PHYSICAL EXAMINATION: ECOG PERFORMANCE STATUS: 0 - Asymptomatic  BP 107/70 (BP Location: Left Arm, Patient  Position: Sitting)   Pulse 61   Temp 97.2 F (36.2 C) (Tympanic)   Wt 170 lb 8.4 oz (77.3 kg)   BMI 25.93 kg/m   Filed Weights   08/17/16 0855  Weight: 170 lb 8.4 oz (77.3 kg)    GENERAL: Well-nourished well-developed; Alert, no distress and comfortable.  Accompanied by son. EYES: no pallor or icterus OROPHARYNX: no thrush or ulceration; good dentition  NECK: supple, no masses felt LYMPH:  no palpable lymphadenopathy in the cervical, axillary or inguinal regions LUNGS: Decreased breath sounds to auscultation and  No wheeze or crackles HEART/CVS: regular rate & rhythm and no murmurs; No lower extremity edema ABDOMEN:abdomen soft, non-tender and normal bowel sounds Musculoskeletal:no cyanosis of digits and no clubbing  PSYCH: alert & oriented x 3 with fluent speech NEURO: no focal motor/sensory deficits SKIN:  no rashes or significant lesions  LABORATORY DATA:  I have reviewed the data as listed    Component Value Date/Time   NA 136 08/16/2016 0945   NA 139 11/17/2015 1526   NA 136 12/24/2014 1457   K 4.5 08/16/2016 0945   K 3.6 12/24/2014 1457   CL 101 08/16/2016 0945   CL 98 (L) 12/24/2014 1457   CO2 27 08/16/2016 0945   CO2 32 12/24/2014 1457   GLUCOSE 103 (H) 08/16/2016 0945   GLUCOSE 107 (H) 12/24/2014 1457   BUN 17 08/16/2016 0945   BUN 30 (H) 11/17/2015 1526   BUN 17 12/24/2014 1457   CREATININE 1.32 (H) 08/16/2016 0945   CREATININE 1.00 12/24/2014 1457   CALCIUM 9.3 08/16/2016 0945   CALCIUM 9.5 12/24/2014 1457   PROT 7.2 08/16/2016 0945   PROT 7.4 12/24/2014 1457   ALBUMIN 3.8 08/16/2016 0945   ALBUMIN 3.9 12/24/2014 1457   AST 18 08/16/2016 0945   AST 22 12/24/2014 1457   ALT 11 (L) 08/16/2016 0945   ALT 19 12/24/2014 1457   ALKPHOS 78 08/16/2016 0945  ALKPHOS 65 12/24/2014 1457   BILITOT 0.4 08/16/2016 0945   BILITOT 0.4 12/24/2014 1457   GFRNONAA 38 (L) 08/16/2016 0945   GFRNONAA 55 (L) 12/24/2014 1457   GFRAA 44 (L) 08/16/2016 0945   GFRAA  >60 12/24/2014 1457    No results found for: SPEP, UPEP  Lab Results  Component Value Date   WBC 7.0 08/16/2016   NEUTROABS 5.0 08/16/2016   HGB 12.6 08/16/2016   HCT 38.8 08/16/2016   MCV 92.5 08/16/2016   PLT 221 08/16/2016      Chemistry      Component Value Date/Time   NA 136 08/16/2016 0945   NA 139 11/17/2015 1526   NA 136 12/24/2014 1457   K 4.5 08/16/2016 0945   K 3.6 12/24/2014 1457   CL 101 08/16/2016 0945   CL 98 (L) 12/24/2014 1457   CO2 27 08/16/2016 0945   CO2 32 12/24/2014 1457   BUN 17 08/16/2016 0945   BUN 30 (H) 11/17/2015 1526   BUN 17 12/24/2014 1457   CREATININE 1.32 (H) 08/16/2016 0945   CREATININE 1.00 12/24/2014 1457      Component Value Date/Time   CALCIUM 9.3 08/16/2016 0945   CALCIUM 9.5 12/24/2014 1457   ALKPHOS 78 08/16/2016 0945   ALKPHOS 65 12/24/2014 1457   AST 18 08/16/2016 0945   AST 22 12/24/2014 1457   ALT 11 (L) 08/16/2016 0945   ALT 19 12/24/2014 1457   BILITOT 0.4 08/16/2016 0945   BILITOT 0.4 12/24/2014 1457       RADIOGRAPHIC STUDIES: I have personally reviewed the radiological images as listed and agreed with the findings in the report. Ct Chest W Contrast  Result Date: 08/16/2016 CLINICAL DATA:  Central right upper lobe lung cancer diagnosed January 2016 status chemoradiation therapy, presenting for restaging. EXAM: CT CHEST WITH CONTRAST TECHNIQUE: Multidetector CT imaging of the chest was performed during intravenous contrast administration. CONTRAST:  67m ISOVUE-300 IOPAMIDOL (ISOVUE-300) INJECTION 61% COMPARISON:  05/27/2016 chest CT. FINDINGS: Cardiovascular: Normal heart size. No significant pericardial fluid/thickening. Right internal jugular MediPort terminates in the lower third of the superior vena cava. Two lead left subclavian pacemaker is noted with lead tips in the right atrium and right ventricular apex. Mitral annuloplasty ring is in place. Left anterior descending, left circumflex and right coronary  atherosclerosis status post CABG with ascending aortic bypass graft. Atherosclerotic nonaneurysmal thoracic aorta. Normal caliber pulmonary arteries. No central pulmonary emboli. Mediastinum/Nodes: No discrete thyroid nodules. Mild circumferential wall thickening in the mid thoracic esophagus appears stable. No esophageal fluid levels or dilatation. No axillary adenopathy. Mildly enlarged 1.0 cm posterior right paratracheal node (series 2/image 8) is stable back to at least 10/01/2015. No new pathologically enlarged mediastinal lymph nodes. No left hilar adenopathy. Lungs/Pleura: No pneumothorax. Stable trace right pleural effusion. No left pleural effusion. Mild-to-moderate centrilobular emphysema with mild diffuse bronchial wall thickening. Previously described nodular posterior left lower lobe opacity on the 05/27/2016 chest CT study has resolved. No acute consolidative airspace disease or new significant pulmonary nodules. Irregular sharply marginated consolidation in the parahilar right lung with associated volume loss and distortion is unchanged and consistent with radiation fibrosis, and is confluent with the right hilum. Upper abdomen: Unremarkable. Musculoskeletal: Sternotomy wires appear aligned and intact. Mild thoracic spondylosis. There are mild superior T11 and L2 vertebral compression fractures, which are new since 05/27/2016. No aggressive appearing focal osseous lesions are noted. IMPRESSION: 1. Stable radiation fibrosis in the parahilar right lung. No findings to suggest local  tumor recurrence. 2. No definite findings of metastatic disease in the chest. Stable solitary mildly enlarged right upper posterior paratracheal lymph node, for which 10 month stability has been demonstrated, suggesting a benign etiology. 3. Previously described nodular left lower lobe opacity has resolved, consistent with resolved infectious or inflammatory opacity. 4. Stable trace right pleural effusion. 5. New mild T11 and  L2 vertebral compression fractures. No associated focal osseous lesions by CT. 6. Stable mild circumferential wall thickening in the mid thoracic esophagus, probably treatment related. 7. Aortic atherosclerosis. Three-vessel coronary atherosclerosis status post CABG. 8. Mild to moderate emphysema with mild diffuse bronchial wall thickening, suggesting COPD. Electronically Signed   By: Ilona Sorrel M.D.   On: 08/16/2016 15:20     ASSESSMENT & PLAN:  Cancer of hilus of left lung ([place]) Stage III lung cancer s/p chemo-RT- 2016. Status post recent bronchoscopy in February 2017-negative for malignancy. Most recent CT scan- DEC 2017- NED; radiation changes.  # ? Radiation pneumonitis/COPD- currently on prednisone 5 mg/day. Awaiting follow up in [place] in Jan 2018.   # T11/ L2- compression fractures? Recommend BMD per PCP; and bisphophonates.  # Elevated creatinine- 1.3 ? Bactrim. Not on diuretics.   # Follow up in 4 months/ labs; no scan.    Orders Placed This Encounter  Procedures  . CBC with Differential    Standing Status:   Future    Standing Expiration Date:   02/15/2017  . Basic metabolic panel    Standing Status:   Future    Standing Expiration Date:   02/15/2017   All questions were answered. The patient knows to call the clinic with any problems, questions or concerns.      Cammie Sickle, MD 08/17/2016 3:30 PM

## 2016-08-17 NOTE — Progress Notes (Signed)
Patient here today for follow up Cancer of hilus of left lung.  Patient c/o back pain today, pt states she has a compression fracture in lower back

## 2016-08-17 NOTE — Assessment & Plan Note (Addendum)
Stage III lung cancer s/p chemo-RT- 2016. Status post recent bronchoscopy in February 2017-negative for malignancy. Most recent CT scan- DEC 2017- NED; radiation changes.  # ? Radiation pneumonitis/COPD- currently on prednisone 5 mg/day. Awaiting follow up in [place] in Jan 2018.   # T11/ L2- compression fractures? Recommend BMD per PCP; and bisphophonates.  # Elevated creatinine- 1.3 ? Bactrim. Not on diuretics.   # Follow up in 4 months/ labs; no scan.

## 2016-08-20 ENCOUNTER — Telehealth: Payer: Self-pay

## 2016-08-20 NOTE — Telephone Encounter (Signed)
Jenny Boehringer, MD  Cammie Sickle, MD  Cc: Shon Hale, CMA        Hi Jenny Cooper,  I have reviewed the CT Chest, good clearing, however still with some scarring.  I would continue with Prednisone, '5mg'$ , for now, it is a small dose.  We will setup her up to see Dr. Mortimer Fries in early January, he will probably stop it by then if she has not further cough/sob.   Thanks  Vishal .    lmtcb X1

## 2016-08-24 ENCOUNTER — Other Ambulatory Visit: Payer: Self-pay | Admitting: Nurse Practitioner

## 2016-08-24 NOTE — Telephone Encounter (Signed)
lmtcb X 2

## 2016-08-31 ENCOUNTER — Other Ambulatory Visit: Payer: Self-pay | Admitting: Cardiovascular Disease

## 2016-08-31 ENCOUNTER — Other Ambulatory Visit: Payer: Self-pay | Admitting: Internal Medicine

## 2016-08-31 ENCOUNTER — Telehealth: Payer: Self-pay | Admitting: Internal Medicine

## 2016-08-31 DIAGNOSIS — M545 Low back pain, unspecified: Secondary | ICD-10-CM

## 2016-08-31 DIAGNOSIS — S32020G Wedge compression fracture of second lumbar vertebra, subsequent encounter for fracture with delayed healing: Secondary | ICD-10-CM

## 2016-08-31 DIAGNOSIS — S32020A Wedge compression fracture of second lumbar vertebra, initial encounter for closed fracture: Secondary | ICD-10-CM | POA: Insufficient documentation

## 2016-08-31 MED ORDER — OXYCODONE HCL 5 MG PO TABS: 5.0000 mg | ORAL_TABLET | Freq: Three times a day (TID) | ORAL | 0 refills | Status: DC | PRN

## 2016-08-31 NOTE — Telephone Encounter (Signed)
Pt called need refill Rx. Oxycodone. I ask pt how she taking the medication: 1 tablet (5 mg total) by mouth every 8 (eight) hours. And the pain is getting worse.

## 2016-09-01 ENCOUNTER — Encounter: Payer: Self-pay | Admitting: Cardiovascular Disease

## 2016-09-01 ENCOUNTER — Ambulatory Visit (INDEPENDENT_AMBULATORY_CARE_PROVIDER_SITE_OTHER): Payer: Medicare Other | Admitting: Cardiovascular Disease

## 2016-09-01 ENCOUNTER — Other Ambulatory Visit: Payer: Self-pay | Admitting: Internal Medicine

## 2016-09-01 VITALS — BP 110/76 | HR 61 | Ht 68.0 in | Wt 165.5 lb

## 2016-09-01 DIAGNOSIS — I214 Non-ST elevation (NSTEMI) myocardial infarction: Secondary | ICD-10-CM | POA: Diagnosis not present

## 2016-09-01 DIAGNOSIS — I5022 Chronic systolic (congestive) heart failure: Secondary | ICD-10-CM

## 2016-09-01 DIAGNOSIS — I480 Paroxysmal atrial fibrillation: Secondary | ICD-10-CM | POA: Diagnosis not present

## 2016-09-01 DIAGNOSIS — J431 Panlobular emphysema: Secondary | ICD-10-CM

## 2016-09-01 DIAGNOSIS — I10 Essential (primary) hypertension: Secondary | ICD-10-CM | POA: Diagnosis not present

## 2016-09-01 DIAGNOSIS — I483 Typical atrial flutter: Secondary | ICD-10-CM

## 2016-09-01 DIAGNOSIS — S32020G Wedge compression fracture of second lumbar vertebra, subsequent encounter for fracture with delayed healing: Secondary | ICD-10-CM

## 2016-09-01 DIAGNOSIS — S32591A Other specified fracture of right pubis, initial encounter for closed fracture: Secondary | ICD-10-CM

## 2016-09-01 DIAGNOSIS — C3491 Malignant neoplasm of unspecified part of right bronchus or lung: Secondary | ICD-10-CM

## 2016-09-01 NOTE — Patient Instructions (Signed)

## 2016-09-01 NOTE — Telephone Encounter (Signed)
Called pt back prescription is ready to pick up and Dr. Army Melia referral  To [facility] orthopedic.

## 2016-09-01 NOTE — Telephone Encounter (Signed)
Please let patient know that prescription is ready for pick up and referral to [facility] has been placed.

## 2016-09-01 NOTE — Progress Notes (Signed)
Patient ID: Jenny Cooper, female   DOB: Mar 18, 1936, 81 y.o.   MRN: [id number] Cardiology Office Note  Date:  09/01/2016   ID:  Jenny, Cooper Feb 21, 1936, MRN [id number]  PCP:  Jenny Maidens, MD   Chief Complaint  Patient presents with  . other     6 month fu post cardiac monitor. Pt c/o pain from recent fractures. Reviewed meds with pt verbally.    HPI:  81 y.o. female with h/o CAD s/p remote history of MI in 2002 s/p 2 vessel CABG post stenting in 2002, history of mitral valve repair in 2002 secondary to mitral regurgitation, history of PAF s/p prior TEE/DCCV on Eliquis, atypical atrial flutter s/p ablation on 07/27/2013 s/p MDT PPM,  COPD, HTN, and HLD who presented to [facility] on 04/01/15 with increased SOB and was found to have PNA, possibly post obstructive and elevated troponin. She presents today to the clinic for follow-up of her atrial fibrillation and coronary artery disease Quit smoking in 2001 carcinoma of right lung, has completed chemotherapy  In follow-up today, she reports that she underwent atrial fibrillation and typical atrial flutter ablation on 9/11/2017at [facility]  Notes reviewed from EP at [facility], all indications are that this was successful On pacer download in follow-up was having no arrhythmia She denies any tachycardia or palpitations concerning for atrial fibrillation or flutter  Her biggest concern is recent falls, and chronic back pain from collapsed vertebrae Suffered pelvic fracture after fall in September 2017 Stumbled in the kitchen. Following the trauma, she completed physical therapy Son presents with her today, reports that she may have got dizzy when in the kitchen, exact details unclear  Conditioning has declined in general, increasing leg weakness Does not present today with cane or walker  Denies any chest pain concerning for angina No orthostasis symptoms. She is not check her blood pressure at home  EKG on today's visit shows probably  a paced rhythm rate 61 bpm, nonspecific T-wave abnormality anterior precordial leads, inferior leads  Other past medical history reviewed Previously on prednisone for shortness of breath, managed by pulmonary  CT scan September 2016 showing improvement of her lung cancer She finished her cancer treatment over the summer 2016, she had chemotherapy and radiation  TEE/DCCV in 2013 for her PAF. In 2014 she was diagnosed with atypical atrial flutter and underwent ablation. underwent PPM generator change in 2013.   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  finished concurrent chemoradiation with carboplatinum and Taxol and PET scan showed significant response.   [facility]. She complained of increase SOB, cough that was productive of green to yellow sputum, nausea, and vomiting.  required BiPAP for a short time upon her arrival. CXR showed bibasilar airspace disease right greater than left has progressed significantly from the prior study, worrisome for recurrent carcinoma however pneumonia could also have this appearance. CT chest has been ordered.   Troponin was found to be 0.48-->1.83.  Echo showed EF 30-35%, severe anterior and infero/posterior wall HK. Left ventricular function parameters were normal, mild to moderate MR. Left atrium was mildly dilated. RV systolic function was normal. Mild to moderate TR. PASP was moderately to severely elevated at 60 mm Hg.   outpatient cardiac cath on 04/28/2015 that showed 3 vessel CAD with patent SVG to LAD, occluded SVG to OM, native RCA had severe ISR in the mid-segment and was occluded distally at the sites of previously placed stents.  There were left to right collaterals. Moderately reduced LVSF with EF of 35-40%. Mildly elevated LVEDP. There were no good targets for revascularization. medical therapy.   cardiac event monitor to evaluate her Afib burden that showed 50% Afib burden with a peak  heart rate of 116, mostly rate controlled. Given this finding her Toprol was titrated up on 9/6 to 50 mg bid.   PMH:   has a past medical history of Atypical atrial flutter ([place]); CAD (coronary artery disease); Carcinoma of right lung ([place]) (01/03/2015); Chronic systolic CHF (congestive heart failure) (Jenny Cooper); Compressed spine fracture (Jenny Cooper) (08/06/2016); COPD (chronic obstructive pulmonary disease) ([place]); Fractured pelvis (Jenny Cooper) (05/26/2016); GERD (gastroesophageal reflux disease); History of blood clots; History of colonoscopy (2013); History of mammography, screening (2015); History of Papanicolaou smear of cervix (2013); HLD (hyperlipidemia); HTN (hypertension); Hypothyroidism; Lung cancer (Jenny Cooper); Mitral regurgitation; Myocardial infarction; Neuropathy ([place]); Pacemaker; and PAF (paroxysmal atrial fibrillation) (Jenny Cooper).  PSH:    Past Surgical History:  Procedure Laterality Date  . ABLATION  04/2016   [facility]  . APPENDECTOMY    . CARDIAC CATHETERIZATION N/A 04/28/2015   Procedure: Left Heart Cath and Coronary Angiography;  Surgeon: Wellington Hampshire, MD;  Location: [place] [facility];  Service: Cardiovascular;  Laterality: N/A;  . COLONOSCOPY  12/2009   2 small tubular adenomas  . CORONARY ARTERY BYPASS GRAFT  09/2000  . ELECTROMAGNETIC NAVIGATION BROCHOSCOPY N/A 10/06/2015   Procedure: ELECTROMAGNETIC NAVIGATION BRONCHOSCOPY;  Surgeon: Flora Lipps, MD;  Location: [facility] ORS;  Service: Cardiopulmonary;  Laterality: N/A;  . ENDOBRONCHIAL ULTRASOUND N/A 10/06/2015   Procedure: ENDOBRONCHIAL ULTRASOUND;  Surgeon: Flora Lipps, MD;  Location: [facility] ORS;  Service: Cardiopulmonary;  Laterality: N/A;  . PACEMAKER INSERTION  03/2012  . VAGINAL HYSTERECTOMY     partial - left ovary remains    Current Outpatient Prescriptions  Medication Sig Dispense Refill  . acetaminophen (TYLENOL) 500 MG tablet Take 500 mg by mouth every 6 (six) hours as needed.    Marland Kitchen apixaban (ELIQUIS) 5 MG TABS tablet Take 5 mg by mouth 2 (two)  times daily.    Marland Kitchen atorvastatin (LIPITOR) 20 MG tablet Take 1 tablet (20 mg total) by mouth at bedtime. 30 tablet 5  . Calcium Carb-Cholecalciferol (CALCIUM 600-D PO) Take by mouth.    . Cetirizine HCl 10 MG CAPS Take 1 capsule (10 mg total) by mouth 1 day or 1 dose. 30 capsule 5  . ENTRESTO 24-26 MG TAKE 1 TABLET BY MOUTH TWICE A DAY 60 tablet 3  . furosemide (LASIX) 20 MG tablet Take 1 tablet (20 mg total) by mouth daily as needed. (Patient taking differently: Take 20 mg by mouth as needed. ) 30 tablet 3  . levothyroxine (SYNTHROID, LEVOTHROID) 75 MCG tablet Take 1 tablet (75 mcg total) by mouth daily before breakfast. 30 tablet 5  . metoprolol succinate (TOPROL-XL) 25 MG 24 hr tablet TAKE 1 TABLET BY MOUTH EVERY MORNING AND 2 TABS IN THE EVENING 90 tablet 3  . omeprazole (PRILOSEC) 20 MG capsule TAKE 1 CAPSULE (20 MG TOTAL) BY MOUTH EVERY MORNING. 30 capsule 6  . oxyCODONE (ROXICODONE) 5 MG immediate release tablet Take 1 tablet (5 mg total) by mouth every 8 (eight) hours as needed. 30 tablet 0  . predniSONE (DELTASONE) 5 MG tablet Take 1 tablet (5 mg total) by mouth daily with breakfast. 1 tablet starting 06-30-16 until follow up visit 60 tablet 0  . senna (SENOKOT) 8.6 MG TABS tablet Take 1 tablet (8.6 mg total) by mouth  daily. (Patient taking differently: Take 1 tablet by mouth daily as needed. ) 30 each 0   No current facility-administered medications for this visit.    Facility-Administered Medications Ordered in Other Visits  Medication Dose Route Frequency Provider Last Rate Last Dose  . sodium chloride 0.9 % injection 10 mL  10 mL Intravenous PRN Jenny Gleason, MD   10 mL at 02/19/15 1000  . sodium chloride flush (NS) 0.9 % injection 10 mL  10 mL Intravenous PRN Cammie Sickle, MD   10 mL at 02/16/16 1048     Allergies:   Lovenox [enoxaparin sodium] and Meperidine   Social History:  The patient  reports that she quit smoking about 16 years ago. Her smoking use included  Cigarettes. She has a 84.00 pack-year smoking history. She has never used smokeless tobacco. She reports that she drinks about 6.0 oz of alcohol per week . She reports that she does not use drugs.   Family History:   family history includes COPD in her mother; Colon cancer in her brother; Hypertension in her brother and sister; Stroke in her maternal grandmother.    Review of Systems: Review of Systems  Respiratory: Negative.   Cardiovascular: Negative.   Gastrointestinal: Negative.   Musculoskeletal: Positive for back pain.       Leg weakness  Neurological: Negative.   Psychiatric/Behavioral: Negative.   All other systems reviewed and are negative.    PHYSICAL EXAM: VS:  BP 110/76 (BP Location: Left Arm, Patient Position: Sitting, Cuff Size: Normal)   Pulse 61   Ht '5\' 8"'$  (1.727 m)   Wt 165 lb 8 oz (75.1 kg)   BMI 25.16 kg/m  , BMI Body mass index is 25.16 kg/m. GEN:  in no acute distress , frail HEENT: normal  Neck: no JVD, carotid bruits, or masses Cardiac: RRR; no murmurs, rubs, or gallops,no edema  Respiratory:  Mildly decreased breath sounds throughout, normal work of breathing GI: soft, nontender, nondistended, + BS MS: no deformity or atrophy  Skin: warm and dry, no rash Neuro:  Strength and sensation are intact Psych: euthymic mood, full affect    Recent Labs: 03/10/2016: TSH 1.840 05/25/2016: Magnesium 2.1 08/16/2016: ALT 11; BUN 17; Creatinine, Ser 1.32; Hemoglobin 12.6; Platelets 221; Potassium 4.5; Sodium 136    Lipid Panel Lab Results  Component Value Date   CHOL 128 02/16/2016   HDL 56 02/16/2016   LDLCALC 45 02/16/2016   TRIG 133 02/16/2016      Wt Readings from Last 3 Encounters:  09/01/16 165 lb 8 oz (75.1 kg)  08/17/16 170 lb 8.4 oz (77.3 kg)  08/13/16 169 lb (76.7 kg)       ASSESSMENT AND PLAN:  Essential hypertension -  Blood pressure is well controlled on today's visit. No changes made to the medications.  Paroxysmal atrial  fibrillation (HCC) - Recent ablation [facility] On anticoagulation, will monitor in the setting of recent falls  Hyperlipidemia - Encouraged her to stay on her Lipitor  Chronic systolic CHF (congestive heart failure) ([place]) Appears euvolemic on today's visit She is taking Lasix only as needed for ankle swelling  DM type 2, goal A1c below 7 Continue aggressive diet Unable to exercise  COPD exacerbation (HCC) Previously treated with prednisone Followed by pulmonary  Chronic back pain Completed physical therapy, scheduled to see orthopedics  Pelvic fracture Slow recovery, improved pain in the past 3 months   Total encounter time more than 25 minutes  Greater than 50% was  spent in counseling and coordination of care with the patient   Disposition:   F/U  6 months   Orders Placed This Encounter  Procedures  . EKG 12-Lead     Signed, Esmond Plants, M.D., Ph.D. 09/01/2016  [place], [place]

## 2016-09-03 ENCOUNTER — Other Ambulatory Visit: Payer: Self-pay | Admitting: Internal Medicine

## 2016-09-03 DIAGNOSIS — N3001 Acute cystitis with hematuria: Secondary | ICD-10-CM

## 2016-09-07 NOTE — Telephone Encounter (Signed)
Pt states she has already spoke with someone and that the appt with DK has been scheduled. Nothing further needed.

## 2016-09-08 ENCOUNTER — Encounter: Payer: Self-pay | Admitting: Internal Medicine

## 2016-09-08 ENCOUNTER — Ambulatory Visit (INDEPENDENT_AMBULATORY_CARE_PROVIDER_SITE_OTHER): Payer: Medicare Other | Admitting: Internal Medicine

## 2016-09-08 VITALS — BP 110/72 | HR 71 | Wt 166.0 lb

## 2016-09-08 DIAGNOSIS — R0602 Shortness of breath: Secondary | ICD-10-CM

## 2016-09-08 NOTE — Patient Instructions (Signed)
STOP PREDNISONE FOLLOW UP ORTHOPEDIC SURGEON INCENTIVE SPIROMETRY 10-15 times per day

## 2016-09-08 NOTE — Progress Notes (Signed)
Date: 09/08/2016  MRN# [id number] Jenny Cooper Jul 07, 1936  PMD - Dr. Gayland Curry Jenny Cooper is a 81 y.o. old female seen in follow up for new LLL mass.   CC:  Chief Complaint  Patient presents with  . Follow-up    SOB w/activity: SOB better since ablasion 04/2016; no chest tightness/pain;   Synopsis - 81 year old female first evaluated by pulmonary in early 2016 for chronic cough, found to have right hilar mass, biopsy of mass and pathology specimens positive for squamous cell, stage IIIa.  Now status post chemoradiation, recently found to have left lower lobe mass, with chronic cough, chronic antibiotics. Biopsy, EBUS, ENB, of left lower lobe mass negative for malignancy  Events since last clinic visit: Patient presents today for follow-up visit of chronic cough, along with postradiation fibrosis. Patient states since her last visit, she is doing well, no cough, SOB and DOE seems to be at baseline She has been on a trial of Breo, which she does not believe is helping and would like to hold it for now. At last visit her steroids were tapered to '5mg'$  daily,she has now down to 2.5 mg and would like to stop completely Since her last visit she has also had an abalation for A.fib, and her breathing is much better.  She also had a CTA Pulm Venous study and chest performed at [facility].  +back pain has L2, T11 and T12 spinal  fractures   PMHX:   Past Medical History:  Diagnosis Date  . Atypical atrial flutter ([place])    a. s/p ablation 07/27/2013 followed by Dr. Rockey Situ  . CAD (coronary artery disease)    a. s/p MI x 2 in 2002 s/p PCI x 2 in 2002; b. s/p 2v CABG 2002; c. stress echo 07/2004 w/ evi of pos & inf infarct & no evi of ischemia; d. 4/08 dipyridamole scan w/ multiple areas of infarct, no ischemia, EF 49%; e. cath 04/28/15 3v CAD, med Rx rec, no targets for revasc, LM lum irregs, pLAD 30%, 100%, ost-pLCx 60%, [name] 99%, OM2 100%, p-mRCA 90%, [name] 100% L-R collats,  VG-mLAD irregs, VG-OM2 oc  . Carcinoma of right lung ([place]) 01/03/2015   a. followed by Dr. Oliva Bustard  . Chronic systolic CHF (congestive heart failure) ([place])    a. echo 03/2015: EF 30-35%, sev ant/inf/pos HK, in mild to mod MR  . Compressed spine fracture ([place]) 08/06/2016   lumbar 2, t11, t12  . COPD (chronic obstructive pulmonary disease) ([place])   . Fractured pelvis ([place]) 05/26/2016   2 places  . GERD (gastroesophageal reflux disease)   . History of blood clots    12/2001  . History of colonoscopy 2013  . History of mammography, screening 2015  . History of Papanicolaou smear of cervix 2013  . HLD (hyperlipidemia)   . HTN (hypertension)   . Hypothyroidism   . Lung cancer ([place])   . Mitral regurgitation    a. s/p mitral ring placement 09/2000; b. echo 09/2010: EF 50%, inf HK, post AK, mild MR, prosthetic mitral valve ring w/ peak gradient of 10 mmHg; b. echo 2/13: EF 50%, mild MR/TR     . Myocardial infarction    X 2 (LAST ONE IN 2002)  . Neuropathy ([place])   . Pacemaker    a. MDT 2002; b. generator replacement 2013; c. followed by Dr. Omelia Blackwater, MD  . PAF (paroxysmal atrial fibrillation) [facility])    a. on Eliquis    Surgical Hx:  Past Surgical  History:  Procedure Laterality Date  . ABLATION  04/2016   [facility]  . APPENDECTOMY    . CARDIAC CATHETERIZATION N/A 04/28/2015   Procedure: Left Heart Cath and Coronary Angiography;  Surgeon: Wellington Hampshire, MD;  Location: [place] [facility];  Service: Cardiovascular;  Laterality: N/A;  . COLONOSCOPY  12/2009   2 small tubular adenomas  . CORONARY ARTERY BYPASS GRAFT  09/2000  . ELECTROMAGNETIC NAVIGATION BROCHOSCOPY N/A 10/06/2015   Procedure: ELECTROMAGNETIC NAVIGATION BRONCHOSCOPY;  Surgeon: Flora Lipps, MD;  Location: [facility] ORS;  Service: Cardiopulmonary;  Laterality: N/A;  . ENDOBRONCHIAL ULTRASOUND N/A 10/06/2015   Procedure: ENDOBRONCHIAL ULTRASOUND;  Surgeon: Flora Lipps, MD;  Location: [facility] ORS;  Service: Cardiopulmonary;  Laterality: N/A;  .  PACEMAKER INSERTION  03/2012  . VAGINAL HYSTERECTOMY     partial - left ovary remains   Family Hx:  Family History  Problem Relation Age of Onset  . COPD Mother     sister, and brother  . Stroke Maternal Grandmother   . Hypertension Sister   . Hypertension Brother   . Colon cancer Brother     age 109  . Heart attack Neg Hx    Social Hx:   Social History  Substance Use Topics  . Smoking status: Former Smoker    Packs/day: 2.00    Years: 42.00    Types: Cigarettes    Quit date: 08/30/2000  . Smokeless tobacco: Never Used     Comment: quit smoking in 08/28/2000  . Alcohol use 6.0 oz/week    10 Glasses of wine per week     Comment: 2 glasses of wine per day   Medication:   Current Outpatient Rx  . Order #: [id number] Class: Historical Med  . Order #: [id number] Class: Historical Med  . Order #: [id number] Class: Normal  . Order #: [id number] Class: Historical Med  . Order #: [id number] Class: Normal  . Order #: [id number] Class: Normal  . Order #: [id number] Class: Normal  . Order #: [id number] Class: Normal  . Order #: [id number] Class: Normal  . Order #: [id number] Class: Normal  . Order #: [id number] Class: Print  . Order #: [id number] Class: Normal  . Order #: [id number] Class: Print      Allergies:  Lovenox [enoxaparin sodium] and Meperidine  Review of Systems: Gen:  Denies  fever, sweats, chills HEENT: Denies blurred vision, double vision, ear pain, eye pain, hearing loss, nose bleeds, sore throat. Mild runny nose mostly in the mornings Cvc:  No dizziness, chest pain or heaviness Resp:  Sob, mild wheezing, cough - mainly non productive.  Gi: Denies swallowing difficulty, stomach pain, nausea or vomiting, diarrhea, constipation, bowel incontinence +back pain has L2, T11 and T12 spinal  fractures Other:  Right hand and foot numbness  Physical Examination:   VS: BP 110/72 (BP Location: Left Arm, Cuff Size: Normal)   Pulse 71   Wt 166 lb (75.3 kg)   SpO2 95%   BMI 25.24 kg/m    General Appearance: No distress  Neuro:without focal findings, mental status, speech normal, alert and oriented, cranial nerves 2-12 intact, reflexes normal and symmetric, sensation grossly normal - right/left hand - sensation intact, strength 5/5 HEENT: PERRLA, EOM intact, no ptosis, no other lesions noticed; Mallampati 2 Pulmonary: coarse upper airway sounds,no wheezing noted on exam today, good airway entry throughout    Sputum Production:  none CardiovascularNormal S1,S2.  No m/r/g.  Abdominal aorta pulsation normal.    Abdomen: Benign, Soft, non-tender, No masses, hepatosplenomegaly, No lymphadenopathy Renal:  No costovertebral tenderness  GU:  No performed at this time. Endoc: No evident thyromegaly, no signs of acromegaly or Cushing features Skin:   warm, no rashes, no ecchymosis  Extremities: normal, no cyanosis, clubbing, no edema, warm with normal capillary refill. Other findings:none   Rad results: (The following images and results were reviewed by Dr. Stevenson Clinch on 09/08/2016). CT Chest 05/06/2016 ([facility]) Technique:Cardiac CT Angiogram, Pulmonary Vein Protocol.Multi-detector high pitch acquisition following intravenous administration ofIsoVue 370 contrast without adverse reaction. Multiplanar reformats were performed at a dedicated workstation per protocol.   Pulmonary Vein Findings:  2 pulmonary veins drain the left lung. The right lung is drained by 4 pulmonary veins including a right upper lobe pulmonary vein, right middle lobe pulmonary vein, right lower lobe pulmonary vein, and a small accessory right upper lobe pulmonary vein. No filling defect visualized within the left atrial appendage. Surgical changes of CABG and mitral valve replacement. Left subclavian approach cardiac pacing device with leads terminating within the right atrium anteriorly and within the right ventricular apex.  Chest Findings:   Central airways are patent. Narrowing of the right upper lobe  bronchus by extensive right hilar soft tissue is visualized. Large right perihilar soft tissue mass. Some borderline enlarged mediastinal lymph nodes are visualized. Right hilar soft tissue mass measures roughly 7.7 cm, series 3 image 55. There is local associated volume loss, interstitial thickening, and traction bronchiectasis. Multifocal consolidative opacities visualized throughout the lungs including anteriorly within the right upper lobe, posteriorly within the right lower lobe, and peripherally throughout the left lower lobe. Moderate right pleural effusion. Small left pleural effusion. Extensive mosaic attenuation visualized throughout the pulmonary parenchyma. Interlobular septal thickening visualized with lungs most prominently seen towards the lung bases. Background of apical predominant centrilobular emphysema.  Impression:  1. 2 pulmonary veins drain the left lung. 4 pulmonary veins drain the right lung including a right upper lobe pulmonary vein which is narrowed by extensive hilar soft tissue, a right middle lobe pulmonary vein, a right lower lobe pulmonary vein, and a small accessory right upper lobe pulmonary vein and adjacent to the ostium of the right lower lobe pulmonary vein. 2. No filling defects visualized within the left atrial appendage. 3. Right hilar soft tissue mass consistent with patient's history of neoplasm. It is unclear if this lesion represents residual/recurrent disease. Correlation with prior imaging is recommended. 4. Multifocal consolidative opacities visualized throughout the lungs. Differential considerations include multifocal infection, drug toxicity, and/or multifocal neoplasm. Correlation with prior imaging is recommended. 5. Trace pulmonary edema. Moderate right pleural effusion and small left pleural effusion.    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   Radiation pneumonitis [facility]) Recent CT Chest from [facility] reviewed, imaging not available only report: -Multifocal consolidative opacities visualized throughout the lungs. Differential considerations include multifocal infection, drug toxicity, and/or multifocal neoplasm. Correlation with prior imaging is recommended. - Trace pulmonary edema. Moderate right pleural effusion and small left pleural effusion.   Plan: - wean off prednisone -Hold Breo since it is not providing much clinical benefit, if symptoms return she is advised to restart Breo -The majority of her symptoms at this time are related to postnasal drip, restart Flonase -Continue with exercise as tolerated  Cough Multifactorial: Postradiation pneumonitis, fibrosis, architectural distortion, anatomical distortion of the right mainstem, right hilum inflammation, left lower lobe mass, COPD, Patient will likely have chronic cough, and some level of chronic dyspnea level the persistent post radiation fibrosis in the right lung.   Today, the majority of her symptoms are from nasal congestion and postnasal drip, she does not have any significant wheezing on lung examination nor decreased airflow on my examination   Advised to continue with allergy regiment - restart nasal saline rinses, and flonase.  Plan: - cont nasal saline rinses (2-3 times per week) -Restart Flonase in the mornings and/or evenings, one actuation per nostril, do not use for more than 3 days consecutively    Follow up in 3 months   I have personally obtained a history, examined the patient, evaluated Pertinent laboratory and RadioGraphic/imaging results, and   formulated the assessment and plan   The Patient requires high complexity decision making for assessment and support, frequent evaluation and titration of therapies.  Patient/Family are satisfied with Plan of action and management. All questions answered  Corrin Parker, M.D.  [facility]  Medical Director [place] Director [facility] Cardio-Pulmonary Department

## 2016-09-09 ENCOUNTER — Telehealth: Payer: Self-pay | Admitting: Internal Medicine

## 2016-09-09 ENCOUNTER — Other Ambulatory Visit: Payer: Self-pay | Admitting: Internal Medicine

## 2016-09-09 DIAGNOSIS — M545 Low back pain, unspecified: Secondary | ICD-10-CM

## 2016-09-09 MED ORDER — OXYCODONE HCL 5 MG PO TABS: 5.0000 mg | ORAL_TABLET | Freq: Three times a day (TID) | ORAL | 0 refills | Status: DC | PRN

## 2016-09-09 NOTE — Telephone Encounter (Signed)
Pt called needed refill on her Rx.Oxycodone until seeing her orthopedic doctor on 09-16-2016

## 2016-09-10 NOTE — Telephone Encounter (Signed)
Called pt to let her sending her medication to the pharmacy

## 2016-09-23 ENCOUNTER — Encounter: Payer: Self-pay | Admitting: Internal Medicine

## 2016-09-23 ENCOUNTER — Ambulatory Visit (INDEPENDENT_AMBULATORY_CARE_PROVIDER_SITE_OTHER): Payer: Medicare Other | Admitting: Internal Medicine

## 2016-09-23 VITALS — BP 108/72 | HR 66 | Temp 97.4°F | Ht 68.0 in | Wt 165.0 lb

## 2016-09-23 DIAGNOSIS — N3000 Acute cystitis without hematuria: Secondary | ICD-10-CM | POA: Diagnosis not present

## 2016-09-23 DIAGNOSIS — S32020G Wedge compression fracture of second lumbar vertebra, subsequent encounter for fracture with delayed healing: Secondary | ICD-10-CM

## 2016-09-23 LAB — POC URINALYSIS WITH MICROSCOPIC (NON AUTO)MANUAL RESULT
BILIRUBIN UA: NEGATIVE
CRYSTALS: 0
EPITHELIAL CELLS, URINE PER MICROSCOPY: 2
GLUCOSE UA: NEGATIVE
Ketones, UA: NEGATIVE
Leukocytes, UA: NEGATIVE
Mucus, UA: 0
Nitrite, UA: NEGATIVE
Protein, UA: NEGATIVE
RBC: 3 M/uL — AB (ref 4.04–5.48)
Spec Grav, UA: <=1.005
UROBILINOGEN UA: 0.2
WBC Casts, UA: 5
pH, UA: 5

## 2016-09-23 MED ORDER — SULFAMETHOXAZOLE-TRIMETHOPRIM 800-160 MG PO TABS: 1.0000 | ORAL_TABLET | Freq: Two times a day (BID) | ORAL | 0 refills | Status: DC

## 2016-09-23 NOTE — Progress Notes (Signed)
Date:  09/23/2016   Name:  Jenny Cooper   DOB:  1936/03/13   MRN:  [id number]   Chief Complaint: Urinary Tract Infection Urinary Tract Infection   This is a recurrent problem. The current episode started in the past 7 days. The problem has been gradually worsening (odor and cloudy). Associated symptoms include frequency. Pertinent negatives include no chills, flank pain or hematuria. She has tried increased fluids for the symptoms. Her past medical history is significant for recurrent UTIs. one month ago E Coli pan sens  Back Pain  This is a new problem. The current episode started more than 1 month ago (compression fractures). The problem occurs constantly. The problem has been gradually worsening since onset. The pain is present in the thoracic spine. The quality of the pain is described as aching and stabbing. Pertinent negatives include no abdominal pain, chest pain, fever or pelvic pain.  Kyphoplasty is scheduled for next week. Pain control is adequate on percocet.    Review of Systems  Constitutional: Negative for chills, fatigue and fever.  Respiratory: Positive for shortness of breath. Negative for cough and chest tightness.   Cardiovascular: Negative for chest pain and palpitations.  Gastrointestinal: Negative for abdominal pain and constipation.  Endocrine: Negative for polyuria.  Genitourinary: Positive for frequency. Negative for difficulty urinating, flank pain, hematuria and pelvic pain.  Musculoskeletal: Positive for back pain and myalgias.  Skin: Negative for color change and rash.    Patient Active Problem List   Diagnosis Date Noted  . Compression fracture of L2 lumbar vertebra ([place]) 08/31/2016  . Acute midline low back pain without sciatica 08/13/2016  . Fracture of multiple pubic rami, right, closed, initial encounter ([place]) 05/30/2016  . Cancer of hilus of left lung ([place]) 05/25/2016  . Cardiomyopathy ([place]) 04/29/2016  . Type 2 diabetes mellitus with  hemoglobin A1c goal of less than 7.0% ([place]) 11/17/2015  . Hx of adenomatous colonic polyps 11/17/2015  . Radiation pneumonitis ([place]) 10/21/2015  . Chronic systolic CHF (congestive heart failure) ([place])   . Mitral regurgitation   . HLD (hyperlipidemia)   . Paroxysmal supraventricular tachycardia ([place])   . Coronary artery disease involving coronary bypass graft of native heart without angina pectoris   . NSTEMI (non-ST elevated myocardial infarction) ([place])   . Arteriosclerosis of coronary artery 01/11/2015  . Hypothyroidism (acquired) 01/11/2015  . Carcinoma of right lung ([place]) 01/03/2015  . Disorder of peripheral nervous system ([place]) 10/04/2014  . Cervical radiculopathy, chronic 10/04/2014  . Squamous cell carcinoma of lung ([place]) 10/04/2014  . Atrial flutter ([place]) 11/16/2013  . Chronic obstructive pulmonary disease ([place]) 04/24/2012  . Hypertension 08/03/2011  . Artificial cardiac pacemaker 08/03/2011    Prior to Admission medications   Medication Sig Start Date End Date Taking? Authorizing Provider  acetaminophen (TYLENOL) 500 MG tablet Take 1,000 mg by mouth every 6 (six) hours.    Yes Historical Provider, MD  apixaban (ELIQUIS) 5 MG TABS tablet Take 5 mg by mouth 2 (two) times daily.   Yes Historical Provider, MD  atorvastatin (LIPITOR) 20 MG tablet Take 1 tablet (20 mg total) by mouth at bedtime. 03/21/16  Yes Glean Hess, MD  Calcium Carb-Cholecalciferol (CALCIUM 600-D PO) Take 2 tablets by mouth every morning.    Yes Historical Provider, MD  Cetirizine HCl 10 MG CAPS Take 1 capsule (10 mg total) by mouth 1 day or 1 dose. Patient taking differently: Take 1 capsule by mouth daily.  05/19/15  Yes Areta Haber  Dunn, PA-C  ENTRESTO 24-26 MG TAKE 1 TABLET BY MOUTH TWICE A DAY 08/31/16  Yes Minna Merritts, MD  furosemide (LASIX) 20 MG tablet Take 1 tablet (20 mg total) by mouth daily as needed. Patient taking differently: Take 20 mg by mouth as needed.  02/16/16  Yes Minna Merritts, MD    levothyroxine (SYNTHROID, LEVOTHROID) 75 MCG tablet Take 1 tablet (75 mcg total) by mouth daily before breakfast. 06/04/16  Yes Glean Hess, MD  metoprolol succinate (TOPROL-XL) 25 MG 24 hr tablet TAKE 1 TABLET BY MOUTH EVERY MORNING AND 2 TABS IN THE EVENING 07/27/16  Yes Minna Merritts, MD  omeprazole (PRILOSEC) 20 MG capsule TAKE 1 CAPSULE (20 MG TOTAL) BY MOUTH EVERY MORNING. 08/02/16  Yes Cammie Sickle, MD  oxyCODONE (ROXICODONE) 5 MG immediate release tablet Take 1 tablet (5 mg total) by mouth every 8 (eight) hours as needed. 09/09/16 09/09/17 Yes Glean Hess, MD  senna (SENOKOT) 8.6 MG TABS tablet Take 1 tablet (8.6 mg total) by mouth daily. Patient taking differently: Take 1 tablet by mouth daily as needed.  05/27/16  Yes Rudene Re, MD    Allergies  Allergen Reactions  . Lovenox [Enoxaparin Sodium] Itching  . Meperidine Other (See Comments)    Other Reaction: pt does not like how it makes her feel    Past Surgical History:  Procedure Laterality Date  . ABLATION  04/2016   [facility]  . APPENDECTOMY    . CARDIAC CATHETERIZATION N/A 04/28/2015   Procedure: Left Heart Cath and Coronary Angiography;  Surgeon: Wellington Hampshire, MD;  Location: [place] [facility];  Service: Cardiovascular;  Laterality: N/A;  . COLONOSCOPY  12/2009   2 small tubular adenomas  . CORONARY ARTERY BYPASS GRAFT  09/2000  . ELECTROMAGNETIC NAVIGATION BROCHOSCOPY N/A 10/06/2015   Procedure: ELECTROMAGNETIC NAVIGATION BRONCHOSCOPY;  Surgeon: Flora Lipps, MD;  Location: [facility] ORS;  Service: Cardiopulmonary;  Laterality: N/A;  . ENDOBRONCHIAL ULTRASOUND N/A 10/06/2015   Procedure: ENDOBRONCHIAL ULTRASOUND;  Surgeon: Flora Lipps, MD;  Location: [facility] ORS;  Service: Cardiopulmonary;  Laterality: N/A;  . PACEMAKER INSERTION  03/2012  . VAGINAL HYSTERECTOMY     partial - left ovary remains    Social History  Substance Use Topics  . Smoking status: Former Smoker    Packs/day: 2.00    Years: 42.00     Types: Cigarettes    Quit date: 08/30/2000  . Smokeless tobacco: Never Used     Comment: quit smoking in 08/28/2000  . Alcohol use 6.0 oz/week    10 Glasses of wine per week     Comment: 2 glasses of wine per day     Medication list has been reviewed and updated.   Physical Exam  Constitutional: She is oriented to person, place, and time. She appears well-developed. No distress.  HENT:  Head: Normocephalic and atraumatic.  Cardiovascular: Normal rate, regular rhythm and normal heart sounds.   Pulmonary/Chest: Effort normal and breath sounds normal. No respiratory distress.  Abdominal: Soft. Bowel sounds are normal. There is no tenderness.  Musculoskeletal: Normal range of motion.  Neurological: She is alert and oriented to person, place, and time.  Skin: Skin is warm and dry. No rash noted.  Psychiatric: She has a normal mood and affect. Her behavior is normal. Thought content normal.  Nursing note and vitals reviewed.   BP 108/72   Pulse 66   Temp 97.4 F (36.3 C)   Ht '5\' 8"'$  (1.727 m)  Wt 165 lb (74.8 kg)   SpO2 94%   BMI 25.09 kg/m   Assessment and Plan: 1. Acute cystitis without hematuria - POC urinalysis w microscopic (non auto) - sulfamethoxazole-trimethoprim (BACTRIM DS,SEPTRA DS) 800-160 MG tablet; Take 1 tablet by mouth 2 (two) times daily.  Dispense: 10 tablet; Refill: 0  2. Closed compression fracture of L2 lumbar vertebra with delayed healing, subsequent encounter Proceed with kyphoplasty Return afterwards for discussion of DEXA and osteoporosis treatment   Halina Maidens, MD [facility]  09/23/2016

## 2016-09-24 ENCOUNTER — Encounter
Admission: RE | Admit: 2016-09-24 | Discharge: 2016-09-24 | Disposition: A | Discharge status: Home / Self Care | Payer: Medicare Other | Source: Ambulatory Visit | Attending: Orthopedic Surgery | Admitting: Orthopedic Surgery

## 2016-09-24 DIAGNOSIS — M4854XA Collapsed vertebra, not elsewhere classified, thoracic region, initial encounter for fracture: Secondary | ICD-10-CM | POA: Diagnosis not present

## 2016-09-24 DIAGNOSIS — Z01818 Encounter for other preprocedural examination: Secondary | ICD-10-CM | POA: Insufficient documentation

## 2016-09-24 HISTORY — DX: Adverse effect of unspecified anesthetic, initial encounter: T41.45XA

## 2016-09-24 HISTORY — DX: Other complications of anesthesia, initial encounter: T88.59XA

## 2016-09-24 LAB — SURGICAL PCR SCREEN
MRSA, PCR: NEGATIVE
STAPHYLOCOCCUS AUREUS: NEGATIVE

## 2016-09-24 NOTE — Pre-Procedure Instructions (Signed)
Most recent cardiac and pulmonology notes reviewed with/by Dr Randa Lynn. No new orders.

## 2016-09-24 NOTE — Patient Instructions (Signed)
Your procedure is scheduled on: Tuesday 09/28/16 Report to [facility]. 2ND FLOOR MEDICAL MALL ENTRANCE. To find out your arrival time please call [phone] between 1PM - 3PM on Monday 09/27/16.  Remember: Instructions that are not followed completely may result in serious medical risk, up to and including death, or upon the discretion of your surgeon and anesthesiologist your surgery may need to be rescheduled.    __X__ 1. Do not eat food or drink liquids after midnight. No gum chewing or hard candies.     __X__ 2. No Alcohol for 24 hours before or after surgery.   ____ 3. Bring all medications with you on the day of surgery if instructed.    __X__ 4. Notify your doctor if there is any change in your medical condition     (cold, fever, infections).             __X___5. No smoking within 24 hours of your surgery.     Do not wear jewelry, make-up, hairpins, clips or nail polish.  Do not wear lotions, powders, or perfumes.   Do not shave 48 hours prior to surgery. Men may shave face and neck.  Do not bring valuables to the hospital.    [facility] is not responsible for any belongings or valuables.               Contacts, dentures or bridgework may not be worn into surgery.  Leave your suitcase in the car. After surgery it may be brought to your room.  For patients admitted to the hospital, discharge time is determined by your                treatment team.   Patients discharged the day of surgery will not be allowed to drive home.   Please read over the following fact sheets that you were given:   Pain Booklet and MRSA Information   __X__ Take these medicines the morning of surgery with A SIP OF WATER:    1. LEVOTHYROXINE  2. METOPROLOL  3. OMEPRAZOLE  4. OXYCODONE IF NEEDED  5. ENTRESTO  6.  ____ Fleet Enema (as directed)   __X__ Use CHG Soap as directed  ____ Use inhalers on the day of surgery  ____ Stop metformin 2 days prior to surgery    ____ Take 1/2 of usual  insulin dose the night before surgery and none on the morning of surgery.   __X__ Stop Coumadin/Plavix/aspirin on CONTACT YOUR DOCTOR REGARDING ABILITY TO STOP ELIQUIS  __X__ Stop Anti-inflammatories such as Advil, Aleve, Ibuprofen, Motrin, Naproxen, Naprosyn, Goodies,powder, or aspirin products.  OK to take Tylenol.   ____ Stop supplements until after surgery.    ____ Bring C-Pap to the hospital.

## 2016-09-27 ENCOUNTER — Inpatient Hospital Stay: Payer: Medicare Other | Attending: Internal Medicine

## 2016-09-28 ENCOUNTER — Ambulatory Visit: Payer: Medicare Other

## 2016-09-28 ENCOUNTER — Ambulatory Visit: Payer: Medicare Other | Admitting: Anesthesiology

## 2016-09-28 ENCOUNTER — Encounter
Admission: RE | Disposition: A | Discharge status: Home / Self Care | Payer: Self-pay | Source: Ambulatory Visit | Attending: Orthopedic Surgery

## 2016-09-28 ENCOUNTER — Inpatient Hospital Stay: Payer: Medicare Other

## 2016-09-28 ENCOUNTER — Ambulatory Visit
Admission: RE | Admit: 2016-09-28 | Discharge: 2016-09-28 | Disposition: A | Discharge status: Home / Self Care | Payer: Medicare Other | Source: Ambulatory Visit | Attending: Orthopedic Surgery | Admitting: Orthopedic Surgery

## 2016-09-28 ENCOUNTER — Encounter: Payer: Self-pay | Admitting: *Deleted

## 2016-09-28 DIAGNOSIS — E785 Hyperlipidemia, unspecified: Secondary | ICD-10-CM | POA: Insufficient documentation

## 2016-09-28 DIAGNOSIS — Z825 Family history of asthma and other chronic lower respiratory diseases: Secondary | ICD-10-CM | POA: Insufficient documentation

## 2016-09-28 DIAGNOSIS — I110 Hypertensive heart disease with heart failure: Secondary | ICD-10-CM | POA: Diagnosis not present

## 2016-09-28 DIAGNOSIS — M4846XA Fatigue fracture of vertebra, lumbar region, initial encounter for fracture: Secondary | ICD-10-CM | POA: Insufficient documentation

## 2016-09-28 DIAGNOSIS — Z885 Allergy status to narcotic agent status: Secondary | ICD-10-CM | POA: Diagnosis not present

## 2016-09-28 DIAGNOSIS — Z888 Allergy status to other drugs, medicaments and biological substances status: Secondary | ICD-10-CM | POA: Insufficient documentation

## 2016-09-28 DIAGNOSIS — I5022 Chronic systolic (congestive) heart failure: Secondary | ICD-10-CM | POA: Diagnosis not present

## 2016-09-28 DIAGNOSIS — Z823 Family history of stroke: Secondary | ICD-10-CM | POA: Insufficient documentation

## 2016-09-28 DIAGNOSIS — Z950 Presence of cardiac pacemaker: Secondary | ICD-10-CM | POA: Diagnosis not present

## 2016-09-28 DIAGNOSIS — Z85118 Personal history of other malignant neoplasm of bronchus and lung: Secondary | ICD-10-CM | POA: Diagnosis not present

## 2016-09-28 DIAGNOSIS — M4854XA Collapsed vertebra, not elsewhere classified, thoracic region, initial encounter for fracture: Secondary | ICD-10-CM | POA: Diagnosis present

## 2016-09-28 DIAGNOSIS — Z8262 Family history of osteoporosis: Secondary | ICD-10-CM | POA: Diagnosis not present

## 2016-09-28 DIAGNOSIS — E119 Type 2 diabetes mellitus without complications: Secondary | ICD-10-CM | POA: Diagnosis not present

## 2016-09-28 DIAGNOSIS — I4891 Unspecified atrial fibrillation: Secondary | ICD-10-CM | POA: Diagnosis not present

## 2016-09-28 DIAGNOSIS — Z951 Presence of aortocoronary bypass graft: Secondary | ICD-10-CM | POA: Diagnosis not present

## 2016-09-28 DIAGNOSIS — Z86718 Personal history of other venous thrombosis and embolism: Secondary | ICD-10-CM | POA: Diagnosis not present

## 2016-09-28 DIAGNOSIS — E039 Hypothyroidism, unspecified: Secondary | ICD-10-CM | POA: Diagnosis not present

## 2016-09-28 DIAGNOSIS — M4844XA Fatigue fracture of vertebra, thoracic region, initial encounter for fracture: Secondary | ICD-10-CM | POA: Diagnosis not present

## 2016-09-28 DIAGNOSIS — M1990 Unspecified osteoarthritis, unspecified site: Secondary | ICD-10-CM | POA: Insufficient documentation

## 2016-09-28 DIAGNOSIS — I2510 Atherosclerotic heart disease of native coronary artery without angina pectoris: Secondary | ICD-10-CM | POA: Insufficient documentation

## 2016-09-28 DIAGNOSIS — Z7901 Long term (current) use of anticoagulants: Secondary | ICD-10-CM | POA: Diagnosis not present

## 2016-09-28 DIAGNOSIS — Z79899 Other long term (current) drug therapy: Secondary | ICD-10-CM | POA: Insufficient documentation

## 2016-09-28 DIAGNOSIS — Z8249 Family history of ischemic heart disease and other diseases of the circulatory system: Secondary | ICD-10-CM | POA: Diagnosis not present

## 2016-09-28 DIAGNOSIS — J309 Allergic rhinitis, unspecified: Secondary | ICD-10-CM | POA: Diagnosis not present

## 2016-09-28 DIAGNOSIS — Z90710 Acquired absence of both cervix and uterus: Secondary | ICD-10-CM | POA: Insufficient documentation

## 2016-09-28 DIAGNOSIS — Z419 Encounter for procedure for purposes other than remedying health state, unspecified: Secondary | ICD-10-CM

## 2016-09-28 HISTORY — PX: KYPHOPLASTY: SHX5884

## 2016-09-28 SURGERY — KYPHOPLASTY
Anesthesia: Monitor Anesthesia Care | Wound class: Clean

## 2016-09-28 MED ORDER — PHENYLEPHRINE HCL 10 MG/ML IJ SOLN
INTRAMUSCULAR | Status: DC | PRN
  Administered 2016-09-28: 100 ug via INTRAVENOUS

## 2016-09-28 MED ORDER — LIDOCAINE HCL 1 % IJ SOLN
INTRAMUSCULAR | Status: DC | PRN
  Administered 2016-09-28: 60 mL

## 2016-09-28 MED ORDER — MIDAZOLAM HCL 2 MG/2ML IJ SOLN
INTRAMUSCULAR | Status: AC
  Filled 2016-09-28: qty 2

## 2016-09-28 MED ORDER — ONDANSETRON HCL 4 MG/2ML IJ SOLN
INTRAMUSCULAR | Status: DC | PRN
  Administered 2016-09-28: 4 mg via INTRAVENOUS

## 2016-09-28 MED ORDER — MIDAZOLAM HCL 2 MG/2ML IJ SOLN
INTRAMUSCULAR | Status: DC | PRN
  Administered 2016-09-28 (×2): 1 mg via INTRAVENOUS

## 2016-09-28 MED ORDER — FENTANYL CITRATE (PF) 100 MCG/2ML IJ SOLN: 25.0000 ug | INTRAMUSCULAR | Status: DC | PRN

## 2016-09-28 MED ORDER — LIDOCAINE HCL (PF) 1 % IJ SOLN
INTRAMUSCULAR | Status: AC
  Filled 2016-09-28: qty 30

## 2016-09-28 MED ORDER — HYDROCODONE-ACETAMINOPHEN 5-325 MG PO TABS: 1.0000 | ORAL_TABLET | ORAL | 0 refills | Status: DC | PRN

## 2016-09-28 MED ORDER — DEXAMETHASONE SODIUM PHOSPHATE 10 MG/ML IJ SOLN
INTRAMUSCULAR | Status: DC | PRN
  Administered 2016-09-28: 5 mg via INTRAVENOUS

## 2016-09-28 MED ORDER — IOPAMIDOL (ISOVUE-M 200) INJECTION 41%
INTRAMUSCULAR | Status: DC | PRN
  Administered 2016-09-28: 50 mL

## 2016-09-28 MED ORDER — CEFAZOLIN SODIUM-DEXTROSE 2-4 GM/100ML-% IV SOLN
2.0000 g | Freq: Once | INTRAVENOUS | Status: AC
  Administered 2016-09-28: 2 g via INTRAVENOUS

## 2016-09-28 MED ORDER — FENTANYL CITRATE (PF) 100 MCG/2ML IJ SOLN
INTRAMUSCULAR | Status: AC
  Filled 2016-09-28: qty 2

## 2016-09-28 MED ORDER — EPINEPHRINE PF 1 MG/ML IJ SOLN
INTRAMUSCULAR | Status: AC
  Filled 2016-09-28: qty 1

## 2016-09-28 MED ORDER — DEXAMETHASONE SODIUM PHOSPHATE 10 MG/ML IJ SOLN
INTRAMUSCULAR | Status: AC
  Filled 2016-09-28: qty 1

## 2016-09-28 MED ORDER — PROPOFOL 10 MG/ML IV BOLUS
INTRAVENOUS | Status: AC
  Filled 2016-09-28: qty 20

## 2016-09-28 MED ORDER — BUPIVACAINE HCL (PF) 0.5 % IJ SOLN
INTRAMUSCULAR | Status: AC
  Filled 2016-09-28: qty 30

## 2016-09-28 MED ORDER — PROPOFOL 500 MG/50ML IV EMUL
INTRAVENOUS | Status: AC
  Filled 2016-09-28: qty 50

## 2016-09-28 MED ORDER — LACTATED RINGERS IV SOLN
INTRAVENOUS | Status: DC
  Administered 2016-09-28: 12:00:00 via INTRAVENOUS

## 2016-09-28 MED ORDER — IPRATROPIUM-ALBUTEROL 0.5-2.5 (3) MG/3ML IN SOLN: 3.0000 mL | RESPIRATORY_TRACT | Status: DC

## 2016-09-28 MED ORDER — CEFAZOLIN SODIUM-DEXTROSE 2-4 GM/100ML-% IV SOLN
INTRAVENOUS | Status: AC
  Filled 2016-09-28: qty 100

## 2016-09-28 MED ORDER — IPRATROPIUM-ALBUTEROL 0.5-2.5 (3) MG/3ML IN SOLN
RESPIRATORY_TRACT | Status: AC
  Administered 2016-09-28: 3 mL via RESPIRATORY_TRACT
  Filled 2016-09-28: qty 3

## 2016-09-28 MED ORDER — FENTANYL CITRATE (PF) 100 MCG/2ML IJ SOLN
INTRAMUSCULAR | Status: DC | PRN
  Administered 2016-09-28 (×2): 50 ug via INTRAVENOUS

## 2016-09-28 MED ORDER — IOPAMIDOL (ISOVUE-M 200) INJECTION 41%
INTRAMUSCULAR | Status: AC
  Filled 2016-09-28: qty 10

## 2016-09-28 MED ORDER — IPRATROPIUM-ALBUTEROL 0.5-2.5 (3) MG/3ML IN SOLN
3.0000 mL | Freq: Once | RESPIRATORY_TRACT | Status: AC
  Administered 2016-09-28: 3 mL via RESPIRATORY_TRACT

## 2016-09-28 MED ORDER — LIDOCAINE HCL 2 % EX GEL
CUTANEOUS | Status: DC | PRN
  Administered 2016-09-28: 1

## 2016-09-28 MED ORDER — PROPOFOL 10 MG/ML IV BOLUS
INTRAVENOUS | Status: DC | PRN
  Administered 2016-09-28: 60 mg via INTRAVENOUS

## 2016-09-28 MED ORDER — BUPIVACAINE-EPINEPHRINE (PF) 0.5% -1:200000 IJ SOLN
INTRAMUSCULAR | Status: DC | PRN
  Administered 2016-09-28: 30 mL via PERINEURAL

## 2016-09-28 MED ORDER — ONDANSETRON HCL 4 MG/2ML IJ SOLN
INTRAMUSCULAR | Status: AC
  Filled 2016-09-28: qty 2

## 2016-09-28 MED ORDER — FAMOTIDINE 20 MG PO TABS
ORAL_TABLET | ORAL | Status: AC
  Filled 2016-09-28: qty 1

## 2016-09-28 MED ORDER — ONDANSETRON HCL 4 MG/2ML IJ SOLN: 4.0000 mg | Freq: Once | INTRAMUSCULAR | Status: DC | PRN

## 2016-09-28 SURGICAL SUPPLY — 17 items
CEMENT BONE KYPHON CDS (Cement) ×2 IMPLANT
DEVICE BIOPSY BONE KYPHX (INSTRUMENTS) ×4 IMPLANT
DRAPE C-ARM XRAY 36X54 (DRAPES) ×2 IMPLANT
DRAPE INCISE IOBAN 66X45 STRL (DRAPES) ×2 IMPLANT
DURAPREP 26ML APPLICATOR (WOUND CARE) ×2 IMPLANT
GLOVE SURG SYN 9.0  PF PI (GLOVE) ×1
GLOVE SURG SYN 9.0 PF PI (GLOVE) ×1 IMPLANT
GOWN SRG 2XL LVL 4 RGLN SLV (GOWNS) ×1 IMPLANT
GOWN STRL NON-REIN 2XL LVL4 (GOWNS) ×1
GOWN STRL REUS W/ TWL LRG LVL3 (GOWN DISPOSABLE) ×1 IMPLANT
GOWN STRL REUS W/TWL LRG LVL3 (GOWN DISPOSABLE) ×1
LIQUID BAND (GAUZE/BANDAGES/DRESSINGS) ×2 IMPLANT
PACK KYPHOPLASTY (MISCELLANEOUS) ×2 IMPLANT
STRAP SAFETY BODY (MISCELLANEOUS) ×2 IMPLANT
SYR 30ML LL (SYRINGE) ×2 IMPLANT
TRAY KYPHOPAK 15/3 EXPRESS 1ST (MISCELLANEOUS) ×2 IMPLANT
TRAY KYPHOPAK 20/3 EXPRESS 1ST (MISCELLANEOUS) ×2 IMPLANT

## 2016-09-28 NOTE — OR Nursing (Addendum)
Patient O2 to normal level per patient 88-96 fluctuates. Dr Ronelle Nigh aware. Patient denies feeling SOB. Encourage patient to practice deep breathing and continue incentive spirometer. Daughter is staying with patient overnight. Patient and daughter instructed regarding symptoms and when to seek treatment. Understanding verbalized.

## 2016-09-28 NOTE — Anesthesia Preprocedure Evaluation (Signed)
Anesthesia Evaluation  Patient identified by MRN, date of birth, ID band Patient awake    Reviewed: Allergy & Precautions, NPO status , Patient's Chart, lab work & pertinent test results, reviewed documented beta blocker date and time   Airway Mallampati: II  TM Distance: >3 FB     Dental  (+) Chipped   Pulmonary COPD, former smoker,           Cardiovascular hypertension, Pt. on medications and Pt. on home beta blockers + CAD, + Past MI, + CABG and +CHF  + pacemaker      Neuro/Psych  Neuromuscular disease    GI/Hepatic   Endo/Other  diabetes, Type 2  Renal/GU      Musculoskeletal   Abdominal   Peds  Hematology   Anesthesia Other Findings Chronically low O2 saturations in the 91% range. Lung ca. CHF with EF  35%. Mitral valve replaced. Hx of AF with ablation. Hx of SVT.  Reproductive/Obstetrics                             Anesthesia Physical Anesthesia Plan  ASA: III  Anesthesia Plan: MAC   Post-op Pain Management:    Induction:   Airway Management Planned:   Additional Equipment:   Intra-op Plan:   Post-operative Plan:   Informed Consent: I have reviewed the patients History and Physical, chart, labs and discussed the procedure including the risks, benefits and alternatives for the proposed anesthesia with the patient or authorized representative who has indicated his/her understanding and acceptance.     Plan Discussed with: CRNA  Anesthesia Plan Comments:         Anesthesia Quick Evaluation

## 2016-09-28 NOTE — Anesthesia Post-op Follow-up Note (Cosign Needed)
Anesthesia QCDR form completed.        

## 2016-09-28 NOTE — Transfer of Care (Signed)
Immediate Anesthesia Transfer of Care Note  Patient: Jenny Cooper  Procedure(s) Performed: Procedure(s): KYPHOPLASTY (N/A)  Patient Location: PACU  Anesthesia Type:General  Level of Consciousness: sedated  Airway & Oxygen Therapy: Patient Spontanous Breathing and Patient connected to face mask oxygen  Post-op Assessment: Report given to RN and Post -op Vital signs reviewed and stable  Post vital signs: Reviewed and stable  Last Vitals:  Vitals:   09/28/16 1126  BP: (!) 112/57  Pulse: 68  Resp: 16  Temp: 37.3 C    Last Pain:  Vitals:   09/28/16 1126  TempSrc: Oral  PainSc: 4          Complications: No apparent anesthesia complications

## 2016-09-28 NOTE — Anesthesia Postprocedure Evaluation (Signed)
Anesthesia Post Note  Patient: Jenny Cooper  Procedure(s) Performed: Procedure(s) (LRB): KYPHOPLASTY (N/A)  Patient location during evaluation: PACU Anesthesia Type: MAC Level of consciousness: awake and alert Pain management: pain level controlled Vital Signs Assessment: post-procedure vital signs reviewed and stable Respiratory status: spontaneous breathing, nonlabored ventilation, respiratory function stable and patient connected to nasal cannula oxygen Cardiovascular status: blood pressure returned to baseline and stable Postop Assessment: no signs of nausea or vomiting Anesthetic complications: no     Last Vitals:  Vitals:   09/28/16 1619 09/28/16 1634  BP: (!) 92/51 (!) 90/45  Pulse: 69 (!) 124  Resp: 19 (!) 8  Temp:      Last Pain:  Vitals:   09/28/16 1519  TempSrc:   PainSc: [name]

## 2016-09-28 NOTE — Discharge Instructions (Addendum)
Minimize activity today and resume normal activities tomorrow.  Remove Band-Aid's on Thursday, okay to shower after that.  Resume eliquis tomorrow morning.  AMBULATORY SURGERY  DISCHARGE INSTRUCTIONS   1) The drugs that you were given will stay in your system until tomorrow so for the next 24 hours you should not:  A) Drive an automobile B) Make any legal decisions C) Drink any alcoholic beverage   2) You may resume regular meals tomorrow.  Today it is better to start with liquids and gradually work up to solid foods.  You may eat anything you prefer, but it is better to start with liquids, then soup and crackers, and gradually work up to solid foods.   3) Please notify your doctor immediately if you have any unusual bleeding, trouble breathing, redness and pain at the surgery site, drainage, fever, or pain not relieved by medication.    4) Additional Instructions:        Please contact your physician with any problems or Same Day Surgery at [phone], Monday through Friday 6 am to 4 pm, or [place] at [facility] number at [phone].

## 2016-09-28 NOTE — Op Note (Signed)
09/28/2016  3:13 PM  PATIENT:  Jenny Cooper  81 y.o. female  PRE-OPERATIVE DIAGNOSIS:  COMPRESSION FRACTURE OF THORACIC SPINE NON TRAUMATIC T 11, T12 and lumbar 2  POST-OPERATIVE DIAGNOSIS:  COMPRESSION FRACTURE OF THORACIC SPINE NON same  PROCEDURE:  Procedure(s): KYPHOPLASTY (N/A) T11, T12, L2  SURGEON: Laurene Footman, MD  ASSISTANTS: None  ANESTHESIA:   local and MAC  EBL:  Total I/O In: 700 [I.V.:700] Out: 0   BLOOD ADMINISTERED:none  DRAINS: none   LOCAL MEDICATIONS USED:  MARCAINE    and XYLOCAINE   SPECIMEN:  Source of Specimen:  T 11 and L2 vertebral body  DISPOSITION OF SPECIMEN:  PATHOLOGY  COUNTS:  YES  TOURNIQUET:  * No tourniquets in log *  IMPLANTS: Bone cement  DICTATION: .Dragon Dictation patient brought the operating room and after adequate anesthesia was obtained patient was placed prone. AP and lateral imaging was obtained and good visualization of the affected levels was obtained in both AP and lateral projections. After patient identification and timeout procedures were completed, and local asthenic was infiltrated subcutaneously on the left at T11 on the right at T12 and L2. The back was then prepped and draped in sterile fashion and repeat timeout procedure carried out. Spinal needle was then used to get local down to the pedicle at each of the affected levels on the above mentioned sides. Small incisions were made and then starting at T 11 on the left trocar advanced in a para pedicular approach. With biopsy obtained at the T11 level drilling was carried out and balloons inserted and inflated with about a cc and a half fill. The identical procedures carried out on the right at T12 but no biopsy was obtained on attempt. Going down to the L2 the procedure carried out as well on the right side with a extrapedicular approach with biopsy obtained. Care was taken on all levels to make sure that the medial wall of the pedicle was not breached on the AP view  until the trocar was in the vertebral body. Drilling and balloon inflation was carried out at each level with approximately 2 cc at T 12 and L2. Cement was then mixed and was the appropriate consistency going from L2 up the levels were filled with small amount extravasation at T12 into the T11-T12 disc space otherwise there is no extravasation and good fill in the midline at both thoracic and lumbar levels. After adequate fill and sit allowing the cement to set trochars removed and Dermabond used to close the skin permanent C-arm views were obtained. Band-Aids were applied over the Dermabond after it had dried    PLAN OF CARE: Discharge after PACU to home   PATIENT DISPOSITION:  PACU - hemodynamically stable.

## 2016-09-28 NOTE — Anesthesia Procedure Notes (Signed)
Procedure Name: MAC Date/Time: 09/28/2016 2:04 PM Performed by: Kennon Holter Pre-anesthesia Checklist: Patient identified, Emergency Drugs available, Suction available, Patient being monitored and Timeout performed Patient Re-evaluated:Patient Re-evaluated prior to inductionOxygen Delivery Method: Simple face mask Preoxygenation: Pre-oxygenation with 100% oxygen Ventilation: Nasal airway inserted- appropriate to patient size Placement Confirmation: positive ETCO2 Comments: #6 nasal airway inserted to right nare.

## 2016-09-28 NOTE — H&P (Signed)
Reviewed paper H+P, will be scanned into chart. No changes noted.  

## 2016-09-29 ENCOUNTER — Encounter: Payer: Self-pay | Admitting: Orthopedic Surgery

## 2016-09-29 ENCOUNTER — Encounter: Payer: Self-pay | Admitting: Internal Medicine

## 2016-10-01 LAB — SURGICAL PATHOLOGY

## 2016-10-04 ENCOUNTER — Inpatient Hospital Stay: Payer: Medicare Other | Attending: Internal Medicine

## 2016-10-04 DIAGNOSIS — Z452 Encounter for adjustment and management of vascular access device: Secondary | ICD-10-CM | POA: Diagnosis not present

## 2016-10-04 DIAGNOSIS — C3402 Malignant neoplasm of left main bronchus: Secondary | ICD-10-CM | POA: Diagnosis present

## 2016-10-04 DIAGNOSIS — Z95828 Presence of other vascular implants and grafts: Secondary | ICD-10-CM

## 2016-10-04 MED ORDER — HEPARIN SOD (PORK) LOCK FLUSH 100 UNIT/ML IV SOLN
500.0000 [IU] | Freq: Once | INTRAVENOUS | Status: AC
  Administered 2016-10-04: 500 [IU] via INTRAVENOUS
  Filled 2016-10-04: qty 5

## 2016-10-04 MED ORDER — SODIUM CHLORIDE 0.9% FLUSH
10.0000 mL | INTRAVENOUS | Status: DC | PRN
  Administered 2016-10-04: 10 mL via INTRAVENOUS
  Filled 2016-10-04: qty 10

## 2016-10-11 ENCOUNTER — Telehealth: Payer: Self-pay | Admitting: Internal Medicine

## 2016-10-11 MED ORDER — PREDNISONE 20 MG PO TABS: 40.0000 mg | ORAL_TABLET | Freq: Every day | ORAL | 0 refills | Status: DC

## 2016-10-11 NOTE — Telephone Encounter (Signed)
Pt daughter calling stating since we took patient off the medication she is having more issues with her breathing  More SOB  Would like some advise on this please advise.

## 2016-10-11 NOTE — Telephone Encounter (Signed)
Spoke with daughter and she states pt had a kyphoplasty on 09/28/16 and she was given breath tx prior to surgery. States after surgery pt's sats were low but eventually came back up. States pt is using Ventolin approx 2-3 times daily. States she feels her mom needs to be seen and possibly go back on Prednisone. Please advise.

## 2016-10-11 NOTE — Telephone Encounter (Signed)
Informed pt's daughter of response and sent RX to pharmacy. Nothing further needed.

## 2016-10-11 NOTE — Telephone Encounter (Signed)
Prednisone 40 mg daily for 7 days 

## 2016-10-26 ENCOUNTER — Other Ambulatory Visit: Payer: Self-pay | Admitting: Internal Medicine

## 2016-11-22 ENCOUNTER — Other Ambulatory Visit: Payer: Self-pay | Admitting: Internal Medicine

## 2016-11-22 ENCOUNTER — Other Ambulatory Visit: Payer: Self-pay | Admitting: Cardiovascular Disease

## 2016-12-14 ENCOUNTER — Other Ambulatory Visit: Payer: Medicare Other

## 2016-12-14 ENCOUNTER — Inpatient Hospital Stay (HOSPITAL_BASED_OUTPATIENT_CLINIC_OR_DEPARTMENT_OTHER): Payer: Medicare Other | Admitting: Internal Medicine

## 2016-12-14 ENCOUNTER — Inpatient Hospital Stay: Payer: Medicare Other | Attending: Internal Medicine

## 2016-12-14 DIAGNOSIS — Z7901 Long term (current) use of anticoagulants: Secondary | ICD-10-CM

## 2016-12-14 DIAGNOSIS — C771 Secondary and unspecified malignant neoplasm of intrathoracic lymph nodes: Secondary | ICD-10-CM

## 2016-12-14 DIAGNOSIS — I2510 Atherosclerotic heart disease of native coronary artery without angina pectoris: Secondary | ICD-10-CM | POA: Diagnosis not present

## 2016-12-14 DIAGNOSIS — Z950 Presence of cardiac pacemaker: Secondary | ICD-10-CM | POA: Diagnosis not present

## 2016-12-14 DIAGNOSIS — E785 Hyperlipidemia, unspecified: Secondary | ICD-10-CM | POA: Insufficient documentation

## 2016-12-14 DIAGNOSIS — I480 Paroxysmal atrial fibrillation: Secondary | ICD-10-CM

## 2016-12-14 DIAGNOSIS — Z800 Family history of malignant neoplasm of digestive organs: Secondary | ICD-10-CM | POA: Insufficient documentation

## 2016-12-14 DIAGNOSIS — Z923 Personal history of irradiation: Secondary | ICD-10-CM

## 2016-12-14 DIAGNOSIS — E039 Hypothyroidism, unspecified: Secondary | ICD-10-CM | POA: Diagnosis not present

## 2016-12-14 DIAGNOSIS — I252 Old myocardial infarction: Secondary | ICD-10-CM

## 2016-12-14 DIAGNOSIS — Z9221 Personal history of antineoplastic chemotherapy: Secondary | ICD-10-CM | POA: Insufficient documentation

## 2016-12-14 DIAGNOSIS — K219 Gastro-esophageal reflux disease without esophagitis: Secondary | ICD-10-CM | POA: Insufficient documentation

## 2016-12-14 DIAGNOSIS — J449 Chronic obstructive pulmonary disease, unspecified: Secondary | ICD-10-CM | POA: Insufficient documentation

## 2016-12-14 DIAGNOSIS — C3401 Malignant neoplasm of right main bronchus: Secondary | ICD-10-CM | POA: Insufficient documentation

## 2016-12-14 DIAGNOSIS — Z79899 Other long term (current) drug therapy: Secondary | ICD-10-CM

## 2016-12-14 DIAGNOSIS — Z86718 Personal history of other venous thrombosis and embolism: Secondary | ICD-10-CM | POA: Diagnosis not present

## 2016-12-14 DIAGNOSIS — I509 Heart failure, unspecified: Secondary | ICD-10-CM | POA: Insufficient documentation

## 2016-12-14 DIAGNOSIS — Z87891 Personal history of nicotine dependence: Secondary | ICD-10-CM | POA: Diagnosis not present

## 2016-12-14 DIAGNOSIS — I340 Nonrheumatic mitral (valve) insufficiency: Secondary | ICD-10-CM | POA: Insufficient documentation

## 2016-12-14 DIAGNOSIS — Z7952 Long term (current) use of systemic steroids: Secondary | ICD-10-CM | POA: Diagnosis not present

## 2016-12-14 DIAGNOSIS — I110 Hypertensive heart disease with heart failure: Secondary | ICD-10-CM | POA: Insufficient documentation

## 2016-12-14 DIAGNOSIS — C3402 Malignant neoplasm of left main bronchus: Secondary | ICD-10-CM

## 2016-12-14 LAB — CBC WITH DIFFERENTIAL/PLATELET
BASOS ABS: 0.1 10*3/uL (ref 0–0.1)
Basophils Relative: 1 %
Eosinophils Absolute: 1 10*3/uL — ABNORMAL HIGH (ref 0–0.7)
Eosinophils Relative: 18 %
HEMATOCRIT: 46.2 % (ref 35.0–47.0)
HEMOGLOBIN: 15 g/dL (ref 12.0–16.0)
LYMPHS PCT: 21 %
Lymphs Abs: 1.1 10*3/uL (ref 1.0–3.6)
MCH: 29.3 pg (ref 26.0–34.0)
MCHC: 32.5 g/dL (ref 32.0–36.0)
MCV: 90.1 fL (ref 80.0–100.0)
MONO ABS: 0.6 10*3/uL (ref 0.2–0.9)
MONOS PCT: 12 %
NEUTROS ABS: 2.5 10*3/uL (ref 1.4–6.5)
NEUTROS PCT: 48 %
Platelets: 227 10*3/uL (ref 150–440)
RBC: 5.13 MIL/uL (ref 3.80–5.20)
RDW: 15 % — AB (ref 11.5–14.5)
WBC: 5.3 10*3/uL (ref 3.6–11.0)

## 2016-12-14 LAB — BASIC METABOLIC PANEL
ANION GAP: 7 (ref 5–15)
BUN: 8 mg/dL (ref 6–20)
CALCIUM: 9.3 mg/dL (ref 8.9–10.3)
CO2: 30 mmol/L (ref 22–32)
Chloride: 99 mmol/L — ABNORMAL LOW (ref 101–111)
Creatinine, Ser: 0.91 mg/dL (ref 0.44–1.00)
GFR calc Af Amer: >60 mL/min (ref >60)
GFR, EST NON AFRICAN AMERICAN: 59 mL/min — AB (ref >60)
GLUCOSE: 122 mg/dL — AB (ref 65–99)
POTASSIUM: 4 mmol/L (ref 3.5–5.1)
Sodium: 136 mmol/L (ref 135–145)

## 2016-12-14 NOTE — Assessment & Plan Note (Addendum)
Stage III lung cancer s/p chemo-RT- 2016. Status post recent bronchoscopy in February 2017-negative for malignancy. Most recent CT scan- DEC 2017- NED; radiation changes.  However, given the worsening shortness of breath [pul ox after walking ~90s]- with history of lung cancer reasonable to get a CT of chest with  Contrast now. Has appt with Dr.Kasa in May 2018.   # ? Radiation pneumonitis/COPD- currently s/p prednisone [last feb 2018].    # Follow up in 3 months/ labs; await on CT scan ordered for today.

## 2016-12-14 NOTE — Progress Notes (Signed)
Here for follow up  ADD pt was 90% on RA-resting.Had c/o of frequent SOB

## 2016-12-14 NOTE — Progress Notes (Signed)
[place] OFFICE PROGRESS NOTE  Patient Care Team: Glean Hess, MD as PCP - General (Family Medicine) Minna Merritts, MD as Consulting Physician (Cardiology) Vilinda Boehringer, MD (Inactive) as Consulting Physician (Pulmonary Disease) Cammie Sickle, MD as Consulting Physician (Internal Medicine)  Cancer Staging No matching staging information was found for the patient.    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 Biopsy from hilar area and lymph node station 4R was positive for squamous cell carcinoma.  Patient had compression of the right mainstem bronchus because of enlarged lymph node and a mass; [ T4 N1 M0 tumor stage IIIa ].  2. Started on radiation and chemotherapy from October 21, 2014 3. Finished 6 cycles of carboplatinum and Taxol  in March  29 th of 2016,  PET scan shows significant response  4. Started on consolidation chemotherapy.  Patient finished 2 cycles on July 2016 of carboplatin and Taxol. 5. Atrial fibrillation diagnosis in August of 2016 on eloquis 6. Repeat bronchoscopy was negative for any malignancy  In February 2017.  # SEP 7th 2017- CT [facility]- 5cm hilar mass; bil Ground glass opacities.   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  female pleasant patient above history of Stage III squamous cell lung cancer status post chemoradiation-  Currently on surveillance s here for follow-p.   Patient continues on prednisone 5 mg a day. Otherwise denies any worsening shortness of breath. Mild chronic cough. No fever no chills. She is awaiting a follow-up appointment with pulmonary next month.  Denies any difficulty swallowing or pain with swallowing.  REVIEW OF SYSTEMS:  A complete  10 point review of system is done which is negative except mentioned above/history of present illness.   PAST MEDICAL HISTORY :  Past Medical History:  Diagnosis Date  . Atypical atrial flutter ([place])    a. s/p ablation 07/27/2013 followed by Dr. Rockey Situ  . CAD (coronary artery disease)    a. s/p MI x 2 in 2002 s/p PCI x 2 in 2002; b. s/p 2v CABG 2002; c. stress echo 07/2004 w/ evi of pos & inf infarct & no evi of ischemia; d. 4/08 dipyridamole scan w/ multiple areas of infarct, no ischemia, EF 49%; e. cath 04/28/15 3v CAD, med Rx rec, no targets for revasc, LM lum irregs, pLAD 30%, 100%, ost-pLCx 60%, [name] 99%, OM2 100%, p-mRCA 90%, [name] 100% L-R collats, VG-mLAD irregs, VG-OM2 oc  . Carcinoma of right lung ([place]) 01/03/2015   a. followed by Dr. Oliva Bustard  . Chronic systolic CHF (congestive heart failure) ([place])    a. echo 03/2015: EF 30-35%, sev ant/inf/pos HK, in mild to mod MR  . Complication of anesthesia    more recently patient oxygen levels do not rebound as quickly  . Compressed spine fracture ([place]) 08/06/2016   lumbar 2, t11, t12  . COPD (chronic obstructive pulmonary disease) ([place])   . Fractured pelvis ([place]) 05/26/2016   2 places  . GERD (gastroesophageal reflux disease)   . History of blood clots    12/2001  . History of colonoscopy 2013  . History of mammography, screening 2015  . History of Papanicolaou smear of cervix  2013  . HLD (hyperlipidemia)   . HTN (hypertension)   . Hypothyroidism   . Lung cancer ([place])   . Mitral regurgitation    a. s/p mitral ring placement 09/2000; b. echo 09/2010: EF 50%, inf HK, post AK, mild MR, prosthetic mitral valve ring w/ peak gradient of 10 mmHg; b. echo 2/13: EF 50%, mild MR/TR     . Myocardial infarction    X 2 (LAST ONE IN 2002)  . Neuropathy ([place])   . Pacemaker    a. MDT 2002; b. generator replacement 2013; c. followed by Dr. Omelia Blackwater, MD  . PAF (paroxysmal atrial fibrillation) [facility])    a. on Eliquis     PAST SURGICAL HISTORY :   Past  Surgical History:  Procedure Laterality Date  . ABLATION  04/2016   [facility]  . APPENDECTOMY    . CARDIAC CATHETERIZATION N/A 04/28/2015   Procedure: Left Heart Cath and Coronary Angiography;  Surgeon: Wellington Hampshire, MD;  Location: [facility];  Service: Cardiovascular;  Laterality: N/A;  . COLONOSCOPY  12/2009   2 small tubular adenomas  . CORONARY ARTERY BYPASS GRAFT  09/2000  . ECTOPIC PREGNANCY SURGERY    . ELECTROMAGNETIC NAVIGATION BROCHOSCOPY N/A 10/06/2015   Procedure: ELECTROMAGNETIC NAVIGATION BRONCHOSCOPY;  Surgeon: Flora Lipps, MD;  Location: [facility];  Service: Cardiopulmonary;  Laterality: N/A;  . ENDOBRONCHIAL ULTRASOUND N/A 10/06/2015   Procedure: ENDOBRONCHIAL ULTRASOUND;  Surgeon: Flora Lipps, MD;  Location: [facility] OR[facility];  Service: Cardiopulmonary;  Laterality: N/A;  . KYPHOPLASTY N/A 09/28/2016   Procedure: KYPHOPLASTY;  Surgeon: Hessie Knows, MD;  Location: [facility] ORS;  Service: Orthopedics;  Laterality: N/A;  . PACEMAKER INSERTION  03/2012  . VAGINAL HYSTERECTOMY     partial - left ovary remains    FAMILY HISTORY :   Family History  Problem Relation Age of Onset  . COPD Mother     sister, and brother  . Stroke Maternal Grandmother   . Hypertension Sister   . Hypertension Brother   . Colon cancer Brother     age 72  . Heart attack Neg Hx     SOCIAL HISTORY:   Social History  Substance Use Topics  . Smoking status: Former Smoker    Packs/day: 2.00    Years: 42.00    Types: Cigarettes    Quit date: 08/30/2000  . Smokeless tobacco: Never Used     Comment: quit smoking in 08/28/2000  . Alcohol use 6.0 oz/week    10 Glasses of wine per week     Comment: 2 glasses of wine per day    ALLERGIES:  is allergic to lovenox [enoxaparin sodium] and meperidine.  MEDICATIONS:  Current Outpatient Prescriptions  Medication Sig Dispense Refill  . acetaminophen (TYLENOL) 500 MG tablet Take 1,000 mg by mouth every 6 (six) hours.     Marland Kitchen apixaban (ELIQUIS) 5 MG TABS tablet  Take by mouth.    Marland Kitchen atorvastatin (LIPITOR) 20 MG tablet TAKE 1 TABLET (20 MG TOTAL) BY MOUTH AT BEDTIME. 30 tablet 5  . Calcium Carb-Cholecalciferol (CALCIUM 600-D PO) Take 2 tablets by mouth every morning.     . Cetirizine HCl 10 MG CAPS Take 1 capsule (10 mg total) by mouth 1 day or 1 dose. (Patient taking differently: Take 1 capsule by mouth daily. ) 30 capsule 5  . ENTRESTO 24-26 MG TAKE 1 TABLET BY MOUTH TWICE A DAY 60 tablet 3  . levothyroxine (SYNTHROID, LEVOTHROID) 75 MCG tablet TAKE 1 TABLET (75 MCG  TOTAL) BY MOUTH DAILY BEFORE BREAKFAST. 30 tablet 5  . metoprolol succinate (TOPROL-XL) 25 MG 24 hr tablet TAKE 1 TABLET BY MOUTH EVERY MORNING AND 2 TABS IN THE EVENING 90 tablet 3  . omeprazole (PRILOSEC) 20 MG capsule TAKE 1 CAPSULE (20 MG TOTAL) BY MOUTH EVERY MORNING. 30 capsule 6  . furosemide (LASIX) 20 MG tablet Take 1 tablet (20 mg total) by mouth daily as needed. (Patient not taking: Reported on 12/14/2016) 30 tablet 3  . HYDROcodone-acetaminophen (NORCO) 5-325 MG tablet Take 1 tablet by mouth every 4 (four) hours as needed for moderate pain. (Patient not taking: Reported on 12/14/2016) 30 tablet 0  . oxyCODONE (ROXICODONE) 5 MG immediate release tablet Take 1 tablet (5 mg total) by mouth every 8 (eight) hours as needed. (Patient not taking: Reported on 12/14/2016) 30 tablet 0  . senna (SENOKOT) 8.6 MG TABS tablet Take 1 tablet (8.6 mg total) by mouth daily. (Patient not taking: Reported on 12/14/2016) 30 each 0   No current facility-administered medications for this visit.    Facility-Administered Medications Ordered in Other Visits  Medication Dose Route Frequency Provider Last Rate Last Dose  . sodium chloride 0.9 % injection 10 mL  10 mL Intravenous PRN Forest Gleason, MD   10 mL at 02/19/15 1000  . sodium chloride flush (NS) 0.9 % injection 10 mL  10 mL Intravenous PRN Cammie Sickle, MD   10 mL at 02/16/16 1048    PHYSICAL EXAMINATION: ECOG PERFORMANCE STATUS: 0 -  Asymptomatic  BP 133/82 (BP Location: Right Arm, Patient Position: Sitting)   Pulse 82   Temp (!) 95.1 F (35.1 C) (Tympanic)   Resp 18   Wt 160 lb 2.6 oz (72.7 kg)   BMI 24.35 kg/m   Filed Weights   12/14/16 1148  Weight: 160 lb 2.6 oz (72.7 kg)    GENERAL: Well-nourished well-developed; Alert, no distress and comfortable.  Accompanied by son. EYES: no pallor or icterus OROPHARYNX: no thrush or ulceration; good dentition  NECK: supple, no masses felt LYMPH:  no palpable lymphadenopathy in the cervical, axillary or inguinal regions LUNGS: Decreased breath sounds to auscultation and  No wheeze or crackles HEART/CVS: regular rate & rhythm and no murmurs; No lower extremity edema ABDOMEN:abdomen soft, non-tender and normal bowel sounds Musculoskeletal:no cyanosis of digits and no clubbing  PSYCH: alert & oriented x 3 with fluent speech NEURO: no focal motor/sensory deficits SKIN:  no rashes or significant lesions  LABORATORY DATA:  I have reviewed the data as listed    Component Value Date/Time   NA 136 12/14/2016 1134   NA 139 11/17/2015 1526   NA 136 12/24/2014 1457   K 4.0 12/14/2016 1134   K 3.6 12/24/2014 1457   CL 99 (L) 12/14/2016 1134   CL 98 (L) 12/24/2014 1457   CO2 30 12/14/2016 1134   CO2 32 12/24/2014 1457   GLUCOSE 122 (H) 12/14/2016 1134   GLUCOSE 107 (H) 12/24/2014 1457   BUN 8 12/14/2016 1134   BUN 30 (H) 11/17/2015 1526   BUN 17 12/24/2014 1457   CREATININE 0.91 12/14/2016 1134   CREATININE 1.00 12/24/2014 1457   CALCIUM 9.3 12/14/2016 1134   CALCIUM 9.5 12/24/2014 1457   PROT 7.2 08/16/2016 0945   PROT 7.4 12/24/2014 1457   ALBUMIN 3.8 08/16/2016 0945   ALBUMIN 3.9 12/24/2014 1457   AST 18 08/16/2016 0945   AST 22 12/24/2014 1457   ALT 11 (L) 08/16/2016 0945   ALT 19  12/24/2014 1457   ALKPHOS 78 08/16/2016 0945   ALKPHOS 65 12/24/2014 1457   BILITOT 0.4 08/16/2016 0945   BILITOT 0.4 12/24/2014 1457   GFRNONAA 59 (L) 12/14/2016 1134    GFRNONAA 55 (L) 12/24/2014 1457   GFRAA >60 12/14/2016 1134   GFRAA >60 12/24/2014 1457    No results found for: SPEP, UPEP  Lab Results  Component Value Date   WBC 5.3 12/14/2016   NEUTROABS 2.5 12/14/2016   HGB 15.0 12/14/2016   HCT 46.2 12/14/2016   MCV 90.1 12/14/2016   PLT 227 12/14/2016      Chemistry      Component Value Date/Time   NA 136 12/14/2016 1134   NA 139 11/17/2015 1526   NA 136 12/24/2014 1457   K 4.0 12/14/2016 1134   K 3.6 12/24/2014 1457   CL 99 (L) 12/14/2016 1134   CL 98 (L) 12/24/2014 1457   CO2 30 12/14/2016 1134   CO2 32 12/24/2014 1457   BUN 8 12/14/2016 1134   BUN 30 (H) 11/17/2015 1526   BUN 17 12/24/2014 1457   CREATININE 0.91 12/14/2016 1134   CREATININE 1.00 12/24/2014 1457      Component Value Date/Time   CALCIUM 9.3 12/14/2016 1134   CALCIUM 9.5 12/24/2014 1457   ALKPHOS 78 08/16/2016 0945   ALKPHOS 65 12/24/2014 1457   AST 18 08/16/2016 0945   AST 22 12/24/2014 1457   ALT 11 (L) 08/16/2016 0945   ALT 19 12/24/2014 1457   BILITOT 0.4 08/16/2016 0945   BILITOT 0.4 12/24/2014 1457       RADIOGRAPHIC STUDIES: I have personally reviewed the radiological images as listed and agreed with the findings in the report. No results found.   ASSESSMENT & PLAN:  Cancer of hilus of right lung ([place]) Stage III lung cancer s/p chemo-RT- 2016. Status post recent bronchoscopy in February 2017-negative for malignancy. Most recent CT scan- DEC 2017- NED; radiation changes.  However, given the worsening shortness of breath [pul ox after walking ~90s]- with history of lung cancer reasonable to get a CT of chest with  Contrast now. Has appt with Dr.Kasa in May 2018.   # ? Radiation pneumonitis/COPD- currently s/p prednisone [last feb 2018].    # Follow up in 3 months/ labs; await on CT scan ordered for today.    Orders Placed This Encounter  Procedures  . CT CHEST W CONTRAST    Standing Status:   Future    Number of Occurrences:   1     Standing Expiration Date:   02/13/2018    Order Specific Question:   Reason for Exam (SYMPTOM  OR DIAGNOSIS REQUIRED)    Answer:   lung cancer; shortness of breath    Order Specific Question:   Preferred imaging location?    Answer:   [facility]  . CBC with Differential    Standing Status:   Future    Standing Expiration Date:   12/14/2017  . Basic metabolic panel    Standing Status:   Future    Standing Expiration Date:   12/14/2017   All questions were answered. The patient knows to call the clinic with any problems, questions or concerns.      Cammie Sickle, MD 12/23/2016 4:38 PM

## 2016-12-17 ENCOUNTER — Ambulatory Visit
Admission: RE | Admit: 2016-12-17 | Discharge: 2016-12-17 | Disposition: A | Discharge status: Home / Self Care | Payer: Medicare Other | Source: Ambulatory Visit | Attending: Internal Medicine | Admitting: Internal Medicine

## 2016-12-17 DIAGNOSIS — C3401 Malignant neoplasm of right main bronchus: Secondary | ICD-10-CM | POA: Insufficient documentation

## 2016-12-17 DIAGNOSIS — R911 Solitary pulmonary nodule: Secondary | ICD-10-CM | POA: Insufficient documentation

## 2016-12-17 DIAGNOSIS — J90 Pleural effusion, not elsewhere classified: Secondary | ICD-10-CM | POA: Diagnosis not present

## 2016-12-17 MED ORDER — IOPAMIDOL (ISOVUE-300) INJECTION 61%
75.0000 mL | Freq: Once | INTRAVENOUS | Status: AC | PRN
  Administered 2016-12-17: 75 mL via INTRAVENOUS

## 2016-12-23 ENCOUNTER — Telehealth: Payer: Self-pay | Admitting: Internal Medicine

## 2016-12-23 DIAGNOSIS — J90 Pleural effusion, not elsewhere classified: Secondary | ICD-10-CM

## 2016-12-23 NOTE — Telephone Encounter (Signed)
Left message to call us back re: results of the CT scan.   H/J- please inform pt that she needs to have fluid built up around the right lung; needs fluid taken off; I have ordered thora/labs; please have it done asap.

## 2016-12-24 ENCOUNTER — Ambulatory Visit
Admission: RE | Admit: 2016-12-24 | Discharge: 2016-12-24 | Disposition: A | Discharge status: Home / Self Care | Payer: Medicare Other | Source: Ambulatory Visit | Attending: Interventional Radiology | Admitting: Interventional Radiology

## 2016-12-24 ENCOUNTER — Ambulatory Visit
Admission: RE | Admit: 2016-12-24 | Discharge: 2016-12-24 | Disposition: A | Discharge status: Home / Self Care | Payer: Medicare Other | Source: Ambulatory Visit | Attending: Internal Medicine | Admitting: Internal Medicine

## 2016-12-24 DIAGNOSIS — J90 Pleural effusion, not elsewhere classified: Secondary | ICD-10-CM | POA: Diagnosis not present

## 2016-12-24 DIAGNOSIS — Z9889 Other specified postprocedural states: Secondary | ICD-10-CM | POA: Diagnosis not present

## 2016-12-24 HISTORY — PX: OTHER SURGICAL HISTORY: SHX169

## 2016-12-24 LAB — PROTEIN, PLEURAL OR PERITONEAL FLUID: Total protein, fluid: 3.5 g/dL

## 2016-12-24 LAB — LACTATE DEHYDROGENASE, PLEURAL OR PERITONEAL FLUID: LD, Fluid: 98 U/L — ABNORMAL HIGH (ref 3–23)

## 2016-12-24 LAB — BODY FLUID CELL COUNT WITH DIFFERENTIAL
EOS FL: 1 %
Lymphs, Fluid: 86 %
Monocyte-Macrophage-Serous Fluid: 5 %
Neutrophil Count, Fluid: 8 %
WBC FLUID: 1525 uL

## 2016-12-24 LAB — GLUCOSE, PLEURAL OR PERITONEAL FLUID: Glucose, Fluid: 106 mg/dL

## 2016-12-24 NOTE — Telephone Encounter (Signed)
RN Reached patient at [phone]. Explained the need for thoracentesis. Pt states that she will contact her daughter to see if a ride can be arranged today. Otherwise, she is not able to schedule the procedure on Monday-due to other scheduling conflicts.  I have left a msg with Pamala Hurry in scheduling for urgent thoracentesis scheduling.

## 2016-12-24 NOTE — Telephone Encounter (Signed)
Jenny Cooper in scheduling contacted nurse back in cancer center. Procedure can be be performed this afternoon. Please have pt in medical mall by 3pm.  I contacted pt back at 1420- she is on her way to medical mall.

## 2016-12-24 NOTE — Procedures (Signed)
[place] right thoracentesis No complication No blood loss. See complete dictation in [facility]. CXR to follow

## 2016-12-26 LAB — PH, BODY FLUID: pH, Body Fluid: 7.8

## 2016-12-27 ENCOUNTER — Telehealth: Payer: Self-pay | Admitting: Internal Medicine

## 2016-12-27 ENCOUNTER — Encounter: Payer: Self-pay | Admitting: Internal Medicine

## 2016-12-27 ENCOUNTER — Ambulatory Visit (INDEPENDENT_AMBULATORY_CARE_PROVIDER_SITE_OTHER): Payer: Medicare Other | Admitting: Internal Medicine

## 2016-12-27 VITALS — BP 102/60 | HR 81 | Ht 68.0 in | Wt 155.8 lb

## 2016-12-27 DIAGNOSIS — E119 Type 2 diabetes mellitus without complications: Secondary | ICD-10-CM | POA: Diagnosis not present

## 2016-12-27 DIAGNOSIS — Z1231 Encounter for screening mammogram for malignant neoplasm of breast: Secondary | ICD-10-CM | POA: Diagnosis not present

## 2016-12-27 DIAGNOSIS — I483 Typical atrial flutter: Secondary | ICD-10-CM

## 2016-12-27 DIAGNOSIS — C3401 Malignant neoplasm of right main bronchus: Secondary | ICD-10-CM

## 2016-12-27 DIAGNOSIS — M810 Age-related osteoporosis without current pathological fracture: Secondary | ICD-10-CM

## 2016-12-27 DIAGNOSIS — I10 Essential (primary) hypertension: Secondary | ICD-10-CM

## 2016-12-27 LAB — CYTOLOGY - NON PAP

## 2016-12-27 NOTE — Telephone Encounter (Signed)
Colette please add as requested below

## 2016-12-27 NOTE — Progress Notes (Signed)
Date:  12/27/2016   Name:  Jenny Cooper   DOB:  30-Jun-1936   MRN:  [id number]   Chief Complaint: Diabetes and Hypertension Diabetes  She presents for her follow-up diabetic visit. She has type 2 diabetes mellitus. Her disease course has been stable. Pertinent negatives for hypoglycemia include no headaches or tremors. Pertinent negatives for diabetes include no chest pain, no fatigue, no polydipsia and no polyuria. Symptoms are stable. Her weight is stable.  Hypertension  This is a chronic problem. The problem is unchanged. The problem is controlled. Associated symptoms include shortness of breath. Pertinent negatives include no chest pain, headaches or palpitations.  Recently had thoracentesis of one liter of fluid.  She is breathing much easier as a result.  The final testing on the fluid is not back yet. Osteoporosis - had kyphoplasty this past January.  She has not had a follow up DEXA.  Lab Results  Component Value Date   HGBA1C 6.3 (H) 06/29/2016     Review of Systems  Constitutional: Negative for appetite change, fatigue, fever and unexpected weight change.  HENT: Negative for tinnitus and trouble swallowing.   Eyes: Negative for visual disturbance.  Respiratory: Positive for shortness of breath. Negative for cough and chest tightness.   Cardiovascular: Negative for chest pain, palpitations and leg swelling.  Gastrointestinal: Negative for abdominal pain.  Endocrine: Negative for polydipsia and polyuria.  Genitourinary: Negative for dysuria and hematuria.  Musculoskeletal: Negative for arthralgias.  Neurological: Positive for numbness. Negative for tremors and headaches.  Psychiatric/Behavioral: Negative for dysphoric mood.    Patient Active Problem List   Diagnosis Date Noted  . Pleural effusion, right 12/23/2016  . Cancer of hilus of right lung ([place]) 12/14/2016  . Compression fracture of L2 lumbar vertebra ([place]) 08/31/2016  . Acute midline low back pain without  sciatica 08/13/2016  . Fracture of multiple pubic rami, right, closed, initial encounter ([place]) 05/30/2016  . Cardiomyopathy ([place]) 04/29/2016  . Type 2 diabetes mellitus with hemoglobin A1c goal of less than 7.0% ([place]) 11/17/2015  . Hx of adenomatous colonic polyps 11/17/2015  . Radiation pneumonitis ([place]) 10/21/2015  . Chronic systolic CHF (congestive heart failure) ([place])   . Mitral regurgitation   . HLD (hyperlipidemia)   . Paroxysmal supraventricular tachycardia ([place])   . Coronary artery disease involving coronary bypass graft of native heart without angina pectoris   . NSTEMI (non-ST elevated myocardial infarction) ([place])   . Arteriosclerosis of coronary artery 01/11/2015  . Hypothyroidism (acquired) 01/11/2015  . Disorder of peripheral nervous system 10/04/2014  . Cervical radiculopathy, chronic 10/04/2014  . Squamous cell carcinoma of lung ([place]) 10/04/2014  . Atrial flutter ([place]) 11/16/2013  . Chronic obstructive pulmonary disease ([place]) 04/24/2012  . Hypertension 08/03/2011  . Artificial cardiac pacemaker 08/03/2011    Prior to Admission medications   Medication Sig Start Date End Date Taking? Authorizing Provider  acetaminophen (TYLENOL) 500 MG tablet Take 1,000 mg by mouth every 6 (six) hours.    Yes Historical Provider, MD  apixaban (ELIQUIS) 5 MG TABS tablet Take by mouth. 08/04/16  Yes Historical Provider, MD  atorvastatin (LIPITOR) 20 MG tablet TAKE 1 TABLET (20 MG TOTAL) BY MOUTH AT BEDTIME. 10/26/16  Yes Glean Hess, MD  Calcium Carb-Cholecalciferol (CALCIUM 600-D PO) Take 2 tablets by mouth every morning.    Yes Historical Provider, MD  Cetirizine HCl 10 MG CAPS Take 1 capsule (10 mg total) by mouth 1 day or 1 dose. Patient taking differently: Take 1  capsule by mouth daily.  05/19/15  Yes Ryan M Dunn, PA-C  ENTRESTO 24-26 MG TAKE 1 TABLET BY MOUTH TWICE A DAY 08/31/16  Yes Minna Merritts, MD  furosemide (LASIX) 20 MG tablet Take 1 tablet (20 mg total) by mouth daily as  needed. 02/16/16  Yes Minna Merritts, MD  levothyroxine (SYNTHROID, LEVOTHROID) 75 MCG tablet TAKE 1 TABLET (75 MCG TOTAL) BY MOUTH DAILY BEFORE BREAKFAST. 11/22/16  Yes Glean Hess, MD  metoprolol succinate (TOPROL-XL) 25 MG 24 hr tablet TAKE 1 TABLET BY MOUTH EVERY MORNING AND 2 TABS IN THE EVENING 11/22/16  Yes Minna Merritts, MD  omeprazole (PRILOSEC) 20 MG capsule TAKE 1 CAPSULE (20 MG TOTAL) BY MOUTH EVERY MORNING. 08/02/16  Yes Cammie Sickle, MD  senna (SENOKOT) 8.6 MG TABS tablet Take 1 tablet (8.6 mg total) by mouth daily. 05/27/16  Yes Rudene Re, MD    Allergies  Allergen Reactions  . Lovenox [Enoxaparin Sodium] Itching  . Meperidine Other (See Comments)    Other Reaction: pt does not like how it makes her feel    Past Surgical History:  Procedure Laterality Date  . ABLATION  04/2016   [facility]  . APPENDECTOMY    . CARDIAC CATHETERIZATION N/A 04/28/2015   Procedure: Left Heart Cath and Coronary Angiography;  Surgeon: Wellington Hampshire, MD;  Location: [facility];  Service: Cardiovascular;  Laterality: N/A;  . COLONOSCOPY  12/2009   2 small tubular adenomas  . CORONARY ARTERY BYPASS GRAFT  09/2000  . ECTOPIC PREGNANCY SURGERY    . ELECTROMAGNETIC NAVIGATION BROCHOSCOPY N/A 10/06/2015   Procedure: ELECTROMAGNETIC NAVIGATION BRONCHOSCOPY;  Surgeon: Flora Lipps, MD;  Location: [facility];  Service: Cardiopulmonary;  Laterality: N/A;  . ENDOBRONCHIAL ULTRASOUND N/A 10/06/2015   Procedure: ENDOBRONCHIAL ULTRASOUND;  Surgeon: Flora Lipps, MD;  Location: [facility];  Service: Cardiopulmonary;  Laterality: N/A;  . KYPHOPLASTY N/A 09/28/2016   Procedure: KYPHOPLASTY;  Surgeon: Hessie Knows, MD;  Location: [facility];  Service: Orthopedics;  Laterality: N/A;  . PACEMAKER INSERTION  03/2012  . thorocentesis  12/24/2016  . VAGINAL HYSTERECTOMY     partial - left ovary remains    Social History  Substance Use Topics  . Smoking status: Former Smoker    Packs/day: 2.00     Years: 42.00    Types: Cigarettes    Quit date: 08/30/2000  . Smokeless tobacco: Never Used     Comment: quit smoking in 08/28/2000  . Alcohol use 6.0 oz/week    10 Glasses of wine per week     Comment: 2 glasses of wine per day     Medication list has been reviewed and updated.   Physical Exam  Constitutional: She is oriented to person, place, and time. She appears well-developed. No distress.  HENT:  Head: Normocephalic and atraumatic.  Cardiovascular: S1 normal and intact distal pulses.  An irregularly irregular rhythm present.  No murmur heard. Pulmonary/Chest: Effort normal. No respiratory distress. She has no decreased breath sounds. She has no wheezes.  Musculoskeletal: Normal range of motion.  Neurological: She is alert and oriented to person, place, and time.  Skin: Skin is warm and dry. No rash noted.  Psychiatric: She has a normal mood and affect. Her speech is normal and behavior is normal. Thought content normal.  Nursing note and vitals reviewed.   BP 102/60 (BP Location: Left Arm, Patient Position: Sitting, Cuff Size: Normal)   Pulse 81   Ht '5\' 8"'$  (1.727 m)  Wt 155 lb 12.8 oz (70.7 kg)   SpO2 97%   BMI 23.69 kg/m   Assessment and Plan: 1. Type 2 diabetes mellitus with hemoglobin A1c goal of less than 7.0% (HCC) Doing well - - Hemoglobin A1c  2. Typical atrial flutter (HCC) Rate controlled  3. Essential hypertension controlled  4. Encounter for screening mammogram for breast cancer - MM DIGITAL SCREENING BILATERAL; Future  5. Osteoporosis without current pathological fracture, unspecified osteoporosis type - DG Bone Density; Future  6. Cancer of hilus of right lung ([place]) Followed by Oncology Pleural fluid analysis pending  No orders of the defined types were placed in this encounter.   Halina Maidens, MD [facility]  12/27/2016

## 2016-12-27 NOTE — Telephone Encounter (Signed)
I recommend follow up tomorrow in [place]/ may 1st in am to discuss the results of the CT scan/next plan of care.

## 2016-12-27 NOTE — Patient Instructions (Signed)
Schedule Diabetic eye exam  Health Maintenance  Topic Date Due  . FOOT EXAM  01/24/1946  . OPHTHALMOLOGY EXAM  [birth year]24/1946  . URINE MICROALBUMIN  01/24/1948  . PNA vac Low Risk Adult (2 of 2 - PCV13) 08/02/2012  . COLONOSCOPY  11/25/2014  . MAMMOGRAM  07/01/2016  . HEMOGLOBIN A1C  12/27/2016  . TETANUS/TDAP  08/30/2017 (Originally 01/23/1957)  . INFLUENZA VACCINE  03/30/2017  . DEXA SCAN  Addressed

## 2016-12-28 ENCOUNTER — Inpatient Hospital Stay: Payer: Medicare Other | Attending: Internal Medicine | Admitting: Internal Medicine

## 2016-12-28 VITALS — BP 114/70 | HR 118 | Temp 97.4°F | Wt 156.9 lb

## 2016-12-28 DIAGNOSIS — I252 Old myocardial infarction: Secondary | ICD-10-CM

## 2016-12-28 DIAGNOSIS — R0602 Shortness of breath: Secondary | ICD-10-CM

## 2016-12-28 DIAGNOSIS — Y842 Radiological procedure and radiotherapy as the cause of abnormal reaction of the patient, or of later complication, without mention of misadventure at the time of the procedure: Secondary | ICD-10-CM | POA: Diagnosis not present

## 2016-12-28 DIAGNOSIS — C3401 Malignant neoplasm of right main bronchus: Secondary | ICD-10-CM

## 2016-12-28 DIAGNOSIS — J449 Chronic obstructive pulmonary disease, unspecified: Secondary | ICD-10-CM | POA: Diagnosis not present

## 2016-12-28 DIAGNOSIS — Z87891 Personal history of nicotine dependence: Secondary | ICD-10-CM

## 2016-12-28 DIAGNOSIS — I509 Heart failure, unspecified: Secondary | ICD-10-CM | POA: Diagnosis not present

## 2016-12-28 DIAGNOSIS — E785 Hyperlipidemia, unspecified: Secondary | ICD-10-CM

## 2016-12-28 DIAGNOSIS — R600 Localized edema: Secondary | ICD-10-CM | POA: Insufficient documentation

## 2016-12-28 DIAGNOSIS — T380X5A Adverse effect of glucocorticoids and synthetic analogues, initial encounter: Secondary | ICD-10-CM | POA: Insufficient documentation

## 2016-12-28 DIAGNOSIS — Z9221 Personal history of antineoplastic chemotherapy: Secondary | ICD-10-CM | POA: Diagnosis not present

## 2016-12-28 DIAGNOSIS — C771 Secondary and unspecified malignant neoplasm of intrathoracic lymph nodes: Secondary | ICD-10-CM | POA: Diagnosis not present

## 2016-12-28 DIAGNOSIS — B370 Candidal stomatitis: Secondary | ICD-10-CM | POA: Insufficient documentation

## 2016-12-28 DIAGNOSIS — Z923 Personal history of irradiation: Secondary | ICD-10-CM

## 2016-12-28 DIAGNOSIS — Z7952 Long term (current) use of systemic steroids: Secondary | ICD-10-CM | POA: Insufficient documentation

## 2016-12-28 DIAGNOSIS — K219 Gastro-esophageal reflux disease without esophagitis: Secondary | ICD-10-CM

## 2016-12-28 DIAGNOSIS — I480 Paroxysmal atrial fibrillation: Secondary | ICD-10-CM

## 2016-12-28 DIAGNOSIS — J700 Acute pulmonary manifestations due to radiation: Secondary | ICD-10-CM

## 2016-12-28 DIAGNOSIS — J90 Pleural effusion, not elsewhere classified: Secondary | ICD-10-CM | POA: Diagnosis not present

## 2016-12-28 DIAGNOSIS — Z951 Presence of aortocoronary bypass graft: Secondary | ICD-10-CM | POA: Diagnosis not present

## 2016-12-28 DIAGNOSIS — Z952 Presence of prosthetic heart valve: Secondary | ICD-10-CM | POA: Insufficient documentation

## 2016-12-28 DIAGNOSIS — E039 Hypothyroidism, unspecified: Secondary | ICD-10-CM

## 2016-12-28 DIAGNOSIS — Z86718 Personal history of other venous thrombosis and embolism: Secondary | ICD-10-CM

## 2016-12-28 DIAGNOSIS — I110 Hypertensive heart disease with heart failure: Secondary | ICD-10-CM

## 2016-12-28 DIAGNOSIS — Z79899 Other long term (current) drug therapy: Secondary | ICD-10-CM

## 2016-12-28 DIAGNOSIS — I2510 Atherosclerotic heart disease of native coronary artery without angina pectoris: Secondary | ICD-10-CM | POA: Diagnosis not present

## 2016-12-28 DIAGNOSIS — Z7901 Long term (current) use of anticoagulants: Secondary | ICD-10-CM | POA: Diagnosis not present

## 2016-12-28 DIAGNOSIS — Z809 Family history of malignant neoplasm, unspecified: Secondary | ICD-10-CM | POA: Insufficient documentation

## 2016-12-28 LAB — BODY FLUID CULTURE: CULTURE: NO GROWTH

## 2016-12-28 LAB — HEMOGLOBIN A1C
ESTIMATED AVERAGE GLUCOSE: 105 mg/dL
HEMOGLOBIN A1C: 5.3 % (ref 4.8–5.6)

## 2016-12-28 MED ORDER — PREDNISONE 20 MG PO TABS: 20.0000 mg | ORAL_TABLET | Freq: Every day | ORAL | 0 refills | Status: DC

## 2016-12-28 NOTE — Progress Notes (Signed)
Patient here today for follow up.  Patient states no new concerns today  

## 2016-12-28 NOTE — Assessment & Plan Note (Addendum)
Stage III squamous cell lung cancer s/p chemo-RT- 2016. Status post bronchoscopy in February 2017-negative for malignancy; However- April 20th CT scan- large right sided pleural effusion [see discussion below]; Stable- Right hilar mass.  # Given the pleural effusion [C discussion below]. ? Recurrence of malignancy- recommend PET scan for further evaluation. Will order Foundation 1 testing.   # Right pleural effusion- status post thoracentesis with symptomatic improvement-; appears exudative [however LDH is low]. No evidence of malignancy noted on the cytology. Positive for reactive/ inflammatory cells in the cytology. Question related to history of radiation pneumonitis. Recommend prednisone 20 mg once a day [poor tolerance to higher doses]. PET scan as discussed above. Marland Kitchen   # ? Radiation pneumonitis/COPD- currently s/p prednisone [last feb 2018].   # will follow up with me few days after the PET scan asap. Patient has appointment with Dr. Jenell Milliner in mid-May.   # I reviewed the blood work- with the patient in detail; also reviewed the imaging independently [as summarized above]; and with the patient in detail.

## 2016-12-28 NOTE — Progress Notes (Signed)
[facility] OFFICE PROGRESS NOTE  Patient Care Team: Glean Hess, MD as PCP - General (Internal Medicine) Minna Merritts, MD as Consulting Physician (Cardiology) Vilinda Boehringer, MD (Inactive) as Consulting Physician (Pulmonary Disease) Cammie Sickle, MD as Consulting Physician (Internal Medicine)  Cancer Staging No matching staging information was found for the patient.    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 Biopsy from hilar area and lymph node station 4R was positive for squamous cell carcinoma.  Patient had compression of the right mainstem bronchus because of enlarged lymph node and a mass; [ T4 N1 M0 tumor stage IIIa ].  2. Started on radiation and chemotherapy from October 21, 2014 3. Finished 6 cycles of carboplatinum and Taxol  in March  29 th of 2016,  PET scan shows significant response  4. Started on consolidation chemotherapy.  Patient finished 2 cycles on July 2016 of carboplatin and Taxol. 5. Atrial fibrillation diagnosis in August of 2016 on eloquis 6. Repeat bronchoscopy was negative for any malignancy  In February 2017.  # SEP 7th 2017- CT [facility]- 5cm hilar mass; bil Ground glass opacities.   # Radiation Pneumonitis [Dr.Mungal] on Prednisone  # May 1st 2018-ordered F one testing.       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  female pleasant patient above history of Stage III squamous cell lung cancer status post chemoradiation-- Currently on surveillance/ is here to review the results of her CT scan that was ordered for worsening shortness of breath.  Patient was noted to have a history of pleural effusion on the right side- needing thoracentesis. 1 L of pleural fluid was drained. Patient feels significant symptomatic  improvement of her shortness of breath post thoracentesis. She continues to have mild cough. This is improving. She denies any fevers or chills.. She is awaiting a follow-up appointment with pulmonary middle of this month.   REVIEW OF SYSTEMS:  A complete 10 point review of system is done which is negative except mentioned above/history of present illness.   PAST MEDICAL HISTORY :  Past Medical History:  Diagnosis Date  . Atypical atrial flutter ([place])    a. s/p ablation 07/27/2013 followed by Dr. Rockey Situ  . CAD (coronary artery disease)    a. s/p MI x 2 in 2002 s/p PCI x 2 in 2002; b. s/p 2v CABG 2002; c. stress echo 07/2004 w/ evi of pos & inf infarct & no evi of ischemia; d. 4/08 dipyridamole scan w/ multiple areas of infarct, no ischemia, EF 49%; e. cath 04/28/15 3v CAD, med Rx rec, no targets for revasc, LM lum irregs, pLAD 30%, 100%, ost-pLCx 60%, [name] 99%, OM2 100%, p-mRCA 90%, [name] 100% L-R collats, VG-mLAD irregs, VG-OM2 oc  . Carcinoma of right lung ([place]) 01/03/2015   a. followed by Dr. Oliva Bustard  . Chronic systolic CHF (congestive heart failure) ([place])    a. echo 03/2015: EF 30-35%, sev ant/inf/pos HK, in mild to mod MR  . Complication of anesthesia    more recently patient oxygen levels do not rebound as quickly  . Compressed spine fracture ([place]) 08/06/2016   lumbar 2, t11, t12  . COPD (chronic obstructive pulmonary disease) ([place])   . Fractured pelvis (  [place]) 05/26/2016   2 places  . GERD (gastroesophageal reflux disease)   . History of blood clots    12/2001  . History of colonoscopy 2013  . History of mammography, screening 2015  . History of Papanicolaou smear of cervix 2013  . HLD (hyperlipidemia)   . HTN (hypertension)   . Hypothyroidism   . Lung cancer ([place])   . Mitral regurgitation    a. s/p mitral ring placement 09/2000; b. echo 09/2010: EF 50%, inf HK, post AK, mild MR, prosthetic mitral valve ring w/ peak gradient of 10 mmHg; b. echo 2/13: EF 50%, mild MR/TR     .  Myocardial infarction ([place])    X 2 (LAST ONE IN 2002)  . Neuropathy   . Pacemaker    a. MDT 2002; b. generator replacement 2013; c. followed by Dr. Omelia [place], MD  . PAF (paroxysmal atrial fibrillation) [facility])    a. on Eliquis     PAST SURGICAL HISTORY :   Past Surgical History:  Procedure Laterality Date  . ABLATION  04/2016   [facility]  . APPENDECTOMY    . CARDIAC CATHETERIZATION N/A 04/28/2015   Procedure: Left Heart Cath and Coronary Angiography;  Surgeon: Wellington Hampshire, MD;  Location: [facility];  Service: Cardiovascular;  Laterality: N/A;  . COLONOSCOPY  12/2009   2 small tubular adenomas  . CORONARY ARTERY BYPASS GRAFT  09/2000  . ECTOPIC PREGNANCY SURGERY    . ELECTROMAGNETIC NAVIGATION BROCHOSCOPY N/A 10/06/2015   Procedure: ELECTROMAGNETIC NAVIGATION BRONCHOSCOPY;  Surgeon: Flora Lipps, MD;  Location: [facility];  Service: Cardiopulmonary;  Laterality: N/A;  . ENDOBRONCHIAL ULTRASOUND N/A 10/06/2015   Procedure: ENDOBRONCHIAL ULTRASOUND;  Surgeon: Flora Lipps, MD;  Location: [facility];  Service: Cardiopulmonary;  Laterality: N/A;  . KYPHOPLASTY N/A 09/28/2016   Procedure: KYPHOPLASTY;  Surgeon: Hessie Knows, MD;  Location: [facility];  Service: Orthopedics;  Laterality: N/A;  . PACEMAKER INSERTION  03/2012  . thorocentesis  12/24/2016  . VAGINAL HYSTERECTOMY     partial - left ovary remains    FAMILY HISTORY :   Family History  Problem Relation Age of Onset  . COPD Mother     sister, and brother  . Stroke Maternal Grandmother   . Hypertension Sister   . Hypertension Brother   . Colon cancer Brother     age 71  . Heart attack Neg Hx     SOCIAL HISTORY:   Social History  Substance Use Topics  . Smoking status: Former Smoker    Packs/day: 2.00    Years: 42.00    Types: Cigarettes    Quit date: 08/30/2000  . Smokeless tobacco: Never Used     Comment: quit smoking in 08/28/2000  . Alcohol use 6.0 oz/week    10 Glasses of wine per week     Comment: 2 glasses of wine  per day    ALLERGIES:  is allergic to lovenox [enoxaparin sodium] and meperidine.  MEDICATIONS:  Current Outpatient Prescriptions  Medication Sig Dispense Refill  . acetaminophen (TYLENOL) 500 MG tablet Take 1,000 mg by mouth every 6 (six) hours.     Marland Kitchen apixaban (ELIQUIS) 5 MG TABS tablet Take by mouth.    Marland Kitchen atorvastatin (LIPITOR) 20 MG tablet TAKE 1 TABLET (20 MG TOTAL) BY MOUTH AT BEDTIME. 30 tablet 5  . Calcium Carb-Cholecalciferol (CALCIUM 600-D PO) Take 2 tablets by mouth every morning.     . Cetirizine HCl 10 MG CAPS Take 1 capsule (10 mg total)  by mouth 1 day or 1 dose. (Patient taking differently: Take 1 capsule by mouth daily. ) 30 capsule 5  . ENTRESTO 24-26 MG TAKE 1 TABLET BY MOUTH TWICE A DAY 60 tablet 3  . furosemide (LASIX) 20 MG tablet Take 1 tablet (20 mg total) by mouth daily as needed. 30 tablet 3  . levothyroxine (SYNTHROID, LEVOTHROID) 75 MCG tablet TAKE 1 TABLET (75 MCG TOTAL) BY MOUTH DAILY BEFORE BREAKFAST. 30 tablet 5  . metoprolol succinate (TOPROL-XL) 25 MG 24 hr tablet TAKE 1 TABLET BY MOUTH EVERY MORNING AND 2 TABS IN THE EVENING 90 tablet 3  . omeprazole (PRILOSEC) 20 MG capsule TAKE 1 CAPSULE (20 MG TOTAL) BY MOUTH EVERY MORNING. 30 capsule 6  . senna (SENOKOT) 8.6 MG TABS tablet Take 1 tablet (8.6 mg total) by mouth daily. 30 each 0  . predniSONE (DELTASONE) 20 MG tablet Take 1 tablet (20 mg total) by mouth daily with breakfast. Once a day with food 30 tablet 0   No current facility-administered medications for this visit.    Facility-Administered Medications Ordered in Other Visits  Medication Dose Route Frequency Provider Last Rate Last Dose  . sodium chloride 0.9 % injection 10 mL  10 mL Intravenous PRN Forest Gleason, MD   10 mL at 02/19/15 1000  . sodium chloride flush (NS) 0.9 % injection 10 mL  10 mL Intravenous PRN Cammie Sickle, MD   10 mL at 02/16/16 1048    PHYSICAL EXAMINATION: ECOG PERFORMANCE STATUS: 0 - Asymptomatic  BP 114/70 (BP  Location: Left Arm, Patient Position: Sitting)   Pulse (!) 118   Temp 97.4 F (36.3 C) (Tympanic)   Wt 156 lb 13.7 oz (71.2 kg)   SpO2 97%   BMI 23.85 kg/m   Filed Weights   12/28/16 1155  Weight: 156 lb 13.7 oz (71.2 kg)    GENERAL: Well-nourished well-developed; Alert, no distress and comfortable. She is alone.  EYES: no pallor or icterus OROPHARYNX: no thrush or ulceration; good dentition  NECK: supple, no masses felt LYMPH:  no palpable lymphadenopathy in the cervical, axillary or inguinal regions LUNGS: Decreased breath sounds to auscultation R>L No wheeze or crackles HEART/CVS: regular rate & rhythm and no murmurs; No lower extremity edema ABDOMEN:abdomen soft, non-tender and normal bowel sounds Musculoskeletal:no cyanosis of digits and no clubbing  PSYCH: alert & oriented x 3 with fluent speech NEURO: no focal motor/sensory deficits SKIN:  no rashes or significant lesions  LABORATORY DATA:  I have reviewed the data as listed    Component Value Date/Time   NA 136 12/14/2016 1134   NA 139 11/17/2015 1526   NA 136 12/24/2014 1457   K 4.0 12/14/2016 1134   K 3.6 12/24/2014 1457   CL 99 (L) 12/14/2016 1134   CL 98 (L) 12/24/2014 1457   CO2 30 12/14/2016 1134   CO2 32 12/24/2014 1457   GLUCOSE 122 (H) 12/14/2016 1134   GLUCOSE 107 (H) 12/24/2014 1457   BUN 8 12/14/2016 1134   BUN 30 (H) 11/17/2015 1526   BUN 17 12/24/2014 1457   CREATININE 0.91 12/14/2016 1134   CREATININE 1.00 12/24/2014 1457   CALCIUM 9.3 12/14/2016 1134   CALCIUM 9.5 12/24/2014 1457   PROT 7.2 08/16/2016 0945   PROT 7.4 12/24/2014 1457   ALBUMIN 3.8 08/16/2016 0945   ALBUMIN 3.9 12/24/2014 1457   AST 18 08/16/2016 0945   AST 22 12/24/2014 1457   ALT 11 (L) 08/16/2016 0945   ALT 19  12/24/2014 1457   ALKPHOS 78 08/16/2016 0945   ALKPHOS 65 12/24/2014 1457   BILITOT 0.4 08/16/2016 0945   BILITOT 0.4 12/24/2014 1457   GFRNONAA 59 (L) 12/14/2016 1134   GFRNONAA 55 (L) 12/24/2014 1457    GFRAA >60 12/14/2016 1134   GFRAA >60 12/24/2014 1457    No results found for: SPEP, UPEP  Lab Results  Component Value Date   WBC 5.3 12/14/2016   NEUTROABS 2.5 12/14/2016   HGB 15.0 12/14/2016   HCT 46.2 12/14/2016   MCV 90.1 12/14/2016   PLT 227 12/14/2016      Chemistry      Component Value Date/Time   NA 136 12/14/2016 1134   NA 139 11/17/2015 1526   NA 136 12/24/2014 1457   K 4.0 12/14/2016 1134   K 3.6 12/24/2014 1457   CL 99 (L) 12/14/2016 1134   CL 98 (L) 12/24/2014 1457   CO2 30 12/14/2016 1134   CO2 32 12/24/2014 1457   BUN 8 12/14/2016 1134   BUN 30 (H) 11/17/2015 1526   BUN 17 12/24/2014 1457   CREATININE 0.91 12/14/2016 1134   CREATININE 1.00 12/24/2014 1457      Component Value Date/Time   CALCIUM 9.3 12/14/2016 1134   CALCIUM 9.5 12/24/2014 1457   ALKPHOS 78 08/16/2016 0945   ALKPHOS 65 12/24/2014 1457   AST 18 08/16/2016 0945   AST 22 12/24/2014 1457   ALT 11 (L) 08/16/2016 0945   ALT 19 12/24/2014 1457   BILITOT 0.4 08/16/2016 0945   BILITOT 0.4 12/24/2014 1457       RADIOGRAPHIC STUDIES: I have personally reviewed the radiological images as listed and agreed with the findings in the report. No results found.   ASSESSMENT & PLAN:  Cancer of hilus of right lung ([place]) Stage III squamous cell lung cancer s/p chemo-RT- 2016. Status post bronchoscopy in February 2017-negative for malignancy; However- April 20th CT scan- large right sided pleural effusion [see discussion below]; Stable- Right hilar mass.  # Given the pleural effusion [C discussion below]. ? Recurrence of malignancy- recommend PET scan for further evaluation. Will order Foundation 1 testing.   # Right pleural effusion- status post thoracentesis with symptomatic improvement-; appears exudative [however LDH is low]. No evidence of malignancy noted on the cytology. Positive for reactive/ inflammatory cells in the cytology. Question related to history of radiation pneumonitis.  Recommend prednisone 20 mg once a day [poor tolerance to higher doses]. PET scan as discussed above. Marland Kitchen   # ? Radiation pneumonitis/COPD- currently s/p prednisone [last feb 2018].   # will follow up with me few days after the PET scan asap. Patient has appointment with Dr. Jenell Milliner in mid-May.   # I reviewed the blood work- with the patient in detail; also reviewed the imaging independently [as summarized above]; and with the patient in detail.    Orders Placed This Encounter  Procedures  . NM PET Image Restag (PS) Skull Base To Thigh    Standing Status:   Future    Standing Expiration Date:   02/27/2018    Order Specific Question:   Reason for Exam (SYMPTOM  OR DIAGNOSIS REQUIRED)    Answer:   lung cancer    Order Specific Question:   Preferred imaging location?    Answer:   [facility]   All questions were answered. The patient knows to call the clinic with any problems, questions or concerns.      Cammie Sickle, MD 12/28/2016 1:08  PM

## 2016-12-30 ENCOUNTER — Encounter
Admission: RE | Admit: 2016-12-30 | Discharge: 2016-12-30 | Disposition: A | Discharge status: Home / Self Care | Payer: Medicare Other | Source: Ambulatory Visit | Attending: Internal Medicine | Admitting: Internal Medicine

## 2016-12-30 DIAGNOSIS — C3401 Malignant neoplasm of right main bronchus: Secondary | ICD-10-CM | POA: Diagnosis not present

## 2016-12-30 LAB — GLUCOSE, CAPILLARY: Glucose-Capillary: 101 mg/dL — ABNORMAL HIGH (ref 65–99)

## 2016-12-30 MED ORDER — FLUDEOXYGLUCOSE F - 18 (FDG) INJECTION
12.0000 | Freq: Once | INTRAVENOUS | Status: AC | PRN
  Administered 2016-12-30: 12 via INTRAVENOUS

## 2016-12-31 ENCOUNTER — Encounter: Payer: Self-pay | Admitting: *Deleted

## 2017-01-04 ENCOUNTER — Inpatient Hospital Stay (HOSPITAL_BASED_OUTPATIENT_CLINIC_OR_DEPARTMENT_OTHER): Payer: Medicare Other | Admitting: Internal Medicine

## 2017-01-04 VITALS — BP 135/84 | HR 71 | Temp 96.5°F | Wt 157.5 lb

## 2017-01-04 DIAGNOSIS — Z809 Family history of malignant neoplasm, unspecified: Secondary | ICD-10-CM

## 2017-01-04 DIAGNOSIS — R0602 Shortness of breath: Secondary | ICD-10-CM

## 2017-01-04 DIAGNOSIS — J700 Acute pulmonary manifestations due to radiation: Secondary | ICD-10-CM | POA: Diagnosis not present

## 2017-01-04 DIAGNOSIS — C771 Secondary and unspecified malignant neoplasm of intrathoracic lymph nodes: Secondary | ICD-10-CM | POA: Diagnosis not present

## 2017-01-04 DIAGNOSIS — Z923 Personal history of irradiation: Secondary | ICD-10-CM

## 2017-01-04 DIAGNOSIS — Z9221 Personal history of antineoplastic chemotherapy: Secondary | ICD-10-CM | POA: Diagnosis not present

## 2017-01-04 DIAGNOSIS — I480 Paroxysmal atrial fibrillation: Secondary | ICD-10-CM

## 2017-01-04 DIAGNOSIS — Z7952 Long term (current) use of systemic steroids: Secondary | ICD-10-CM

## 2017-01-04 DIAGNOSIS — Z87891 Personal history of nicotine dependence: Secondary | ICD-10-CM

## 2017-01-04 DIAGNOSIS — Z7901 Long term (current) use of anticoagulants: Secondary | ICD-10-CM

## 2017-01-04 DIAGNOSIS — J449 Chronic obstructive pulmonary disease, unspecified: Secondary | ICD-10-CM | POA: Diagnosis not present

## 2017-01-04 DIAGNOSIS — Y842 Radiological procedure and radiotherapy as the cause of abnormal reaction of the patient, or of later complication, without mention of misadventure at the time of the procedure: Secondary | ICD-10-CM | POA: Diagnosis not present

## 2017-01-04 DIAGNOSIS — I509 Heart failure, unspecified: Secondary | ICD-10-CM

## 2017-01-04 DIAGNOSIS — R600 Localized edema: Secondary | ICD-10-CM

## 2017-01-04 DIAGNOSIS — Z86718 Personal history of other venous thrombosis and embolism: Secondary | ICD-10-CM

## 2017-01-04 DIAGNOSIS — E039 Hypothyroidism, unspecified: Secondary | ICD-10-CM

## 2017-01-04 DIAGNOSIS — K219 Gastro-esophageal reflux disease without esophagitis: Secondary | ICD-10-CM

## 2017-01-04 DIAGNOSIS — I252 Old myocardial infarction: Secondary | ICD-10-CM

## 2017-01-04 DIAGNOSIS — I2510 Atherosclerotic heart disease of native coronary artery without angina pectoris: Secondary | ICD-10-CM

## 2017-01-04 DIAGNOSIS — C3401 Malignant neoplasm of right main bronchus: Secondary | ICD-10-CM

## 2017-01-04 DIAGNOSIS — E785 Hyperlipidemia, unspecified: Secondary | ICD-10-CM

## 2017-01-04 DIAGNOSIS — J90 Pleural effusion, not elsewhere classified: Secondary | ICD-10-CM

## 2017-01-04 DIAGNOSIS — I110 Hypertensive heart disease with heart failure: Secondary | ICD-10-CM

## 2017-01-04 DIAGNOSIS — Z79899 Other long term (current) drug therapy: Secondary | ICD-10-CM

## 2017-01-04 MED ORDER — PREDNISONE 20 MG PO TABS: ORAL_TABLET | ORAL | 0 refills | Status: DC

## 2017-01-04 NOTE — Progress Notes (Signed)
Patient here today for follow up.  Patient c/o of worsening SOB

## 2017-01-04 NOTE — Assessment & Plan Note (Addendum)
Stage III squamous cell lung cancer s/p chemo-RT- 2016. Status post bronchoscopy in February 2017-negative for malignancy; However- April 20th CT scan- large right sided pleural effusion [see discussion below]; Stable- Right hilar mass. May 5th PET scan shows no significant concerns for recurrent malignancy; shows radiation changes; pleural effusion [moderate-C discussion below]  # Right pleural effusion- status post thoracentesis with symptomatic improvement-; appears exudative [however LDH is low]. No evidence of malignancy noted on the cytology. Positive for reactive/ inflammatory cells in the cytology. Question related to history of radiation pneumonitis. Discussed with patient that this test be drained again if she gets overtly symptomatic. Also increase the dose of prednisone to 40 mg a day for 2 weeks and then 1 once a day. Also await evaluation with Dr.Kasa next week.  Clinically less likely CHF   # Afib s/p PPM [Dr.Gollan]- next week appt.   # follow up in 3 weeks/ no labs. Call us if difficulty breathing getting worse we will need to get a chest x-ray/ ultrasound thoracentesis.   # I reviewed the blood work- with the patient in detail; also reviewed the imaging independently [as summarized above]; and with the patient in detail.   CC: Dr. Rockey Situ; Mortimer Fries.

## 2017-01-04 NOTE — Progress Notes (Signed)
[place] OFFICE PROGRESS NOTE  Patient Care Team: Glean Hess, MD as PCP - General (Internal Medicine) Rockey Situ Kathlene November, MD as Consulting Physician (Cardiology) Vilinda Boehringer, MD (Inactive) as Consulting Physician (Pulmonary Disease) Cammie Sickle, MD as Consulting Physician (Internal Medicine)  Cancer Staging No matching staging information was found for the patient.    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 Biopsy from hilar area and lymph node station 4R was positive for squamous cell carcinoma.  Patient had compression of the right mainstem bronchus because of enlarged lymph node and a mass; [ T4 N1 M0 tumor stage IIIa ].  2. Started on radiation and chemotherapy from October 21, 2014 3. Finished 6 cycles of carboplatinum and Taxol  in March  29 th of 2016,  PET scan shows significant response  4. Started on consolidation chemotherapy.  Patient finished 2 cycles on July 2016 of carboplatin and Taxol. 5. Atrial fibrillation diagnosis in August of 2016 on eloquis 6. Repeat bronchoscopy was negative for any malignancy  In February 2017.  # SEP 7th 2017- CT [facility]- 5cm hilar mass; bil Ground glass opacities.   # Radiation Pneumonitis [Dr.Mungal] on Prednisone  # May 1st 2018-ordered F one testing.       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  female pleasant patient above history of Stage III squamous cell lung cancer status post chemoradiation [finished treatment July 2016]; The most recent right pleural effusion status post thoracentesis approximately 2 weeks ago. Patient is here to review the results of the PET scan that was ordered for further evaluation of her lung malignancy. Patient was started on prednisone 20 mg a day approximately  1 week ago.  Patient notes to have slight worsening shortness of breath especially with exertion over the last 1 week or so. Mild cough. Otherwise no unusual hemoptysis. Intermittent swelling in the legs. Not persistent. History of A. Fib- with paroxysmal elevated heart rate. No dizzy spells or syncopal episodes.  REVIEW OF SYSTEMS:  A complete 10 point review of system is done which is negative except mentioned above/history of present illness.   PAST MEDICAL HISTORY :  Past Medical History:  Diagnosis Date  . Atypical atrial flutter ([place])    a. s/p ablation 07/27/2013 followed by Dr. Rockey Situ  . CAD (coronary artery disease)    a. s/p MI x 2 in 2002 s/p PCI x 2 in 2002; b. s/p 2v CABG 2002; c. stress echo 07/2004 w/ evi of pos & inf infarct & no evi of ischemia; d. 4/08 dipyridamole scan w/ multiple areas of infarct, no ischemia, EF 49%; e. cath 04/28/15 3v CAD, med Rx rec, no targets for revasc, LM lum irregs, pLAD 30%, 100%, ost-pLCx 60%, [name] 99%, OM2 100%, p-mRCA 90%, [name] 100% L-R collats, VG-mLAD irregs, VG-OM2 oc  . Carcinoma of right lung ([place]) 01/03/2015   a. followed by Dr. Oliva Bustard  . Chronic systolic CHF (congestive heart failure) ([place])    a. echo 03/2015: EF 30-35%, sev ant/inf/pos HK, in mild to mod MR  . Complication of anesthesia    more recently patient oxygen levels do not rebound as quickly  . Compressed spine fracture ([place]) 08/06/2016   lumbar 2, t11,  t12  . COPD (chronic obstructive pulmonary disease) ([place])   . Fractured pelvis ([place]) 05/26/2016   2 places  . GERD (gastroesophageal reflux disease)   . History of blood clots    12/2001  . History of colonoscopy 2013  . History of mammography, screening 2015  . History of Papanicolaou smear of cervix 2013  . HLD (hyperlipidemia)   . HTN (hypertension)   . Hypothyroidism   . Lung cancer ([place])   . Mitral regurgitation    a. s/p mitral ring placement 09/2000; b. echo 09/2010: EF 50%, inf HK, post AK, mild MR, prosthetic  mitral valve ring w/ peak gradient of 10 mmHg; b. echo 2/13: EF 50%, mild MR/TR     . Myocardial infarction ([place])    X 2 (LAST ONE IN 2002)  . Neuropathy   . Pacemaker    a. MDT 2002; b. generator replacement 2013; c. followed by Dr. Omelia Blackwater, MD  . PAF (paroxysmal atrial fibrillation) [facility])    a. on Eliquis     PAST SURGICAL HISTORY :   Past Surgical History:  Procedure Laterality Date  . ABLATION  04/2016   [facility]  . APPENDECTOMY    . CARDIAC CATHETERIZATION N/A 04/28/2015   Procedure: Left Heart Cath and Coronary Angiography;  Surgeon: Wellington Hampshire, MD;  Location: [place];  Service: Cardiovascular;  Laterality: N/A;  . COLONOSCOPY  12/2009   2 small tubular adenomas  . CORONARY ARTERY BYPASS GRAFT  09/2000  . ECTOPIC PREGNANCY SURGERY    . ELECTROMAGNETIC NAVIGATION BROCHOSCOPY N/A 10/06/2015   Procedure: ELECTROMAGNETIC NAVIGATION BRONCHOSCOPY;  Surgeon: Flora Lipps, MD;  Location: [facility];  Service: Cardiopulmonary;  Laterality: N/A;  . ENDOBRONCHIAL ULTRASOUND N/A 10/06/2015   Procedure: ENDOBRONCHIAL ULTRASOUND;  Surgeon: Flora Lipps, MD;  Location: [facility];  Service: Cardiopulmonary;  Laterality: N/A;  . KYPHOPLASTY N/A 09/28/2016   Procedure: KYPHOPLASTY;  Surgeon: Hessie Knows, MD;  Location: [facility] OR[facility];  Service: Orthopedics;  Laterality: N/A;  . PACEMAKER INSERTION  03/2012  . thorocentesis  12/24/2016  . VAGINAL HYSTERECTOMY     partial - left ovary remains    FAMILY HISTORY :   Family History  Problem Relation Age of Onset  . COPD Mother     sister, and brother  . Stroke Maternal Grandmother   . Hypertension Sister   . Hypertension Brother   . Colon cancer Brother     age 74  . Heart attack Neg Hx     SOCIAL HISTORY:   Social History  Substance Use Topics  . Smoking status: Former Smoker    Packs/day: 2.00    Years: 42.00    Types: Cigarettes    Quit date: 08/30/2000  . Smokeless tobacco: Never Used     Comment: quit smoking in 08/28/2000  .  Alcohol use 6.0 oz/week    10 Glasses of wine per week     Comment: 2 glasses of wine per day    ALLERGIES:  is allergic to lovenox [enoxaparin sodium] and meperidine.  MEDICATIONS:  Current Outpatient Prescriptions  Medication Sig Dispense Refill  . acetaminophen (TYLENOL) 500 MG tablet Take 1,000 mg by mouth every 6 (six) hours.     Marland Kitchen apixaban (ELIQUIS) 5 MG TABS tablet Take 5 mg by mouth 2 (two) times daily.     Marland Kitchen atorvastatin (LIPITOR) 20 MG tablet TAKE 1 TABLET (20 MG TOTAL) BY MOUTH AT BEDTIME. 30 tablet 5  . Calcium Carb-Cholecalciferol (CALCIUM 600-D PO) Take 2  tablets by mouth every morning.     . Cetirizine HCl 10 MG CAPS Take 1 capsule (10 mg total) by mouth 1 day or 1 dose. (Patient taking differently: Take 1 capsule by mouth daily. ) 30 capsule 5  . ENTRESTO 24-26 MG TAKE 1 TABLET BY MOUTH TWICE A DAY 60 tablet 3  . furosemide (LASIX) 20 MG tablet Take 1 tablet (20 mg total) by mouth daily as needed. 30 tablet 3  . levothyroxine (SYNTHROID, LEVOTHROID) 75 MCG tablet TAKE 1 TABLET (75 MCG TOTAL) BY MOUTH DAILY BEFORE BREAKFAST. 30 tablet 5  . metoprolol succinate (TOPROL-XL) 25 MG 24 hr tablet TAKE 1 TABLET BY MOUTH EVERY MORNING AND 2 TABS IN THE EVENING 90 tablet 3  . omeprazole (PRILOSEC) 20 MG capsule TAKE 1 CAPSULE (20 MG TOTAL) BY MOUTH EVERY MORNING. 30 capsule 6  . predniSONE (DELTASONE) 20 MG tablet Take 2 tablets once a day x2 weeks; and then once a day; take with food 40 tablet 0  . senna (SENOKOT) 8.6 MG TABS tablet Take 1 tablet (8.6 mg total) by mouth daily. 30 each 0   No current facility-administered medications for this visit.    Facility-Administered Medications Ordered in Other Visits  Medication Dose Route Frequency Provider Last Rate Last Dose  . sodium chloride 0.9 % injection 10 mL  10 mL Intravenous PRN Forest Gleason, MD   10 mL at 02/19/15 1000  . sodium chloride flush (NS) 0.9 % injection 10 mL  10 mL Intravenous PRN Cammie Sickle, MD   10 mL  at 02/16/16 1048    PHYSICAL EXAMINATION: ECOG PERFORMANCE STATUS: 0 - Asymptomatic  BP 135/84 (BP Location: Right Arm, Patient Position: Sitting)   Pulse 71   Temp (!) 96.5 F (35.8 C)   Wt 157 lb 8.3 oz (71.5 kg)   SpO2 98%   BMI 23.95 kg/m   Filed Weights   01/04/17 1059  Weight: 157 lb 8.3 oz (71.5 kg)    GENERAL: Well-nourished well-developed; Alert, no distress and comfortable. She is alone.  EYES: no pallor or icterus OROPHARYNX: no thrush or ulceration; good dentition  NECK: supple, no masses felt LYMPH:  no palpable lymphadenopathy in the cervical, axillary or inguinal regions LUNGS: Decreased breath sounds to auscultation R>L No wheeze or crackles HEART/CVS: regular rate & rhythm and no murmurs; No lower extremity edema ABDOMEN:abdomen soft, non-tender and normal bowel sounds Musculoskeletal:no cyanosis of digits and no clubbing  PSYCH: alert & oriented x 3 with fluent speech NEURO: no focal motor/sensory deficits SKIN:  no rashes or significant lesions  LABORATORY DATA:  I have reviewed the data as listed    Component Value Date/Time   NA 136 12/14/2016 1134   NA 139 11/17/2015 1526   NA 136 12/24/2014 1457   K 4.0 12/14/2016 1134   K 3.6 12/24/2014 1457   CL 99 (L) 12/14/2016 1134   CL 98 (L) 12/24/2014 1457   CO2 30 12/14/2016 1134   CO2 32 12/24/2014 1457   GLUCOSE 122 (H) 12/14/2016 1134   GLUCOSE 107 (H) 12/24/2014 1457   BUN 8 12/14/2016 1134   BUN 30 (H) 11/17/2015 1526   BUN 17 12/24/2014 1457   CREATININE 0.91 12/14/2016 1134   CREATININE 1.00 12/24/2014 1457   CALCIUM 9.3 12/14/2016 1134   CALCIUM 9.5 12/24/2014 1457   PROT 7.2 08/16/2016 0945   PROT 7.4 12/24/2014 1457   ALBUMIN 3.8 08/16/2016 0945   ALBUMIN 3.9 12/24/2014 1457   AST  18 08/16/2016 0945   AST 22 12/24/2014 1457   ALT 11 (L) 08/16/2016 0945   ALT 19 12/24/2014 1457   ALKPHOS 78 08/16/2016 0945   ALKPHOS 65 12/24/2014 1457   BILITOT 0.4 08/16/2016 0945   BILITOT 0.4  12/24/2014 1457   GFRNONAA 59 (L) 12/14/2016 1134   GFRNONAA 55 (L) 12/24/2014 1457   GFRAA >60 12/14/2016 1134   GFRAA >60 12/24/2014 1457    No results found for: SPEP, UPEP  Lab Results  Component Value Date   WBC 5.3 12/14/2016   NEUTROABS 2.5 12/14/2016   HGB 15.0 12/14/2016   HCT 46.2 12/14/2016   MCV 90.1 12/14/2016   PLT 227 12/14/2016      Chemistry      Component Value Date/Time   NA 136 12/14/2016 1134   NA 139 11/17/2015 1526   NA 136 12/24/2014 1457   K 4.0 12/14/2016 1134   K 3.6 12/24/2014 1457   CL 99 (L) 12/14/2016 1134   CL 98 (L) 12/24/2014 1457   CO2 30 12/14/2016 1134   CO2 32 12/24/2014 1457   BUN 8 12/14/2016 1134   BUN 30 (H) 11/17/2015 1526   BUN 17 12/24/2014 1457   CREATININE 0.91 12/14/2016 1134   CREATININE 1.00 12/24/2014 1457      Component Value Date/Time   CALCIUM 9.3 12/14/2016 1134   CALCIUM 9.5 12/24/2014 1457   ALKPHOS 78 08/16/2016 0945   ALKPHOS 65 12/24/2014 1457   AST 18 08/16/2016 0945   AST 22 12/24/2014 1457   ALT 11 (L) 08/16/2016 0945   ALT 19 12/24/2014 1457   BILITOT 0.4 08/16/2016 0945   BILITOT 0.4 12/24/2014 1457       RADIOGRAPHIC STUDIES: I have personally reviewed the radiological images as listed and agreed with the findings in the report. No results found.   ASSESSMENT & PLAN:  Cancer of hilus of right lung ([place]) Stage III squamous cell lung cancer s/p chemo-RT- 2016. Status post bronchoscopy in February 2017-negative for malignancy; However- April 20th CT scan- large right sided pleural effusion [see discussion below]; Stable- Right hilar mass. May 5th PET scan shows no significant concerns for recurrent malignancy; shows radiation changes; pleural effusion [moderate-C discussion below]  # Right pleural effusion- status post thoracentesis with symptomatic improvement-; appears exudative [however LDH is low]. No evidence of malignancy noted on the cytology. Positive for reactive/ inflammatory cells in  the cytology. Question related to history of radiation pneumonitis. Discussed with patient that this test be drained again if she gets overtly symptomatic. Also increase the dose of prednisone to 40 mg a day for 2 weeks and then 1 once a day. Also await evaluation with Dr.Kasa next week.  Clinically less likely CHF   # Afib s/p PPM [Dr.Gollan]- next week appt.   # follow up in 3 weeks/ no labs. Call us if difficulty breathing getting worse we will need to get a chest x-ray/ ultrasound thoracentesis.   # I reviewed the blood work- with the patient in detail; also reviewed the imaging independently [as summarized above]; and with the patient in detail.   CC: Dr. Rockey Situ; Mortimer Fries.    No orders of the defined types were placed in this encounter.  All questions were answered. The patient knows to call the clinic with any problems, questions or concerns.      Cammie Sickle, MD 01/04/2017 1:30 PM

## 2017-01-06 ENCOUNTER — Ambulatory Visit: Payer: Medicare Other

## 2017-01-06 ENCOUNTER — Other Ambulatory Visit: Payer: Medicare Other

## 2017-01-08 ENCOUNTER — Other Ambulatory Visit: Payer: Self-pay | Admitting: Cardiovascular Disease

## 2017-01-08 ENCOUNTER — Encounter: Payer: Self-pay | Admitting: Cardiovascular Disease

## 2017-01-10 ENCOUNTER — Ambulatory Visit
Admission: RE | Admit: 2017-01-10 | Discharge: 2017-01-10 | Disposition: A | Discharge status: Home / Self Care | Payer: Medicare Other | Source: Ambulatory Visit | Attending: Internal Medicine | Admitting: Internal Medicine

## 2017-01-10 ENCOUNTER — Other Ambulatory Visit: Payer: Self-pay

## 2017-01-10 ENCOUNTER — Other Ambulatory Visit: Payer: Self-pay | Admitting: Internal Medicine

## 2017-01-10 DIAGNOSIS — Z1231 Encounter for screening mammogram for malignant neoplasm of breast: Secondary | ICD-10-CM | POA: Diagnosis present

## 2017-01-10 DIAGNOSIS — M810 Age-related osteoporosis without current pathological fracture: Secondary | ICD-10-CM

## 2017-01-10 DIAGNOSIS — R928 Other abnormal and inconclusive findings on diagnostic imaging of breast: Secondary | ICD-10-CM

## 2017-01-10 DIAGNOSIS — E2839 Other primary ovarian failure: Secondary | ICD-10-CM | POA: Diagnosis present

## 2017-01-10 DIAGNOSIS — M85851 Other specified disorders of bone density and structure, right thigh: Secondary | ICD-10-CM | POA: Insufficient documentation

## 2017-01-10 HISTORY — DX: Personal history of antineoplastic chemotherapy: Z92.21

## 2017-01-10 HISTORY — DX: Personal history of irradiation: Z92.3

## 2017-01-10 MED ORDER — SACUBITRIL-VALSARTAN 24-26 MG PO TABS: 1.0000 | ORAL_TABLET | Freq: Two times a day (BID) | ORAL | 6 refills | Status: DC

## 2017-01-10 NOTE — Telephone Encounter (Signed)
Can you please review for refill? I see you sent in the last Rx but I'm not seeing the patient is followed here. I see [facility].

## 2017-01-11 ENCOUNTER — Encounter: Payer: Self-pay | Admitting: Internal Medicine

## 2017-01-12 ENCOUNTER — Encounter: Payer: Self-pay | Admitting: Cardiovascular Disease

## 2017-01-13 ENCOUNTER — Ambulatory Visit (INDEPENDENT_AMBULATORY_CARE_PROVIDER_SITE_OTHER): Payer: Medicare Other | Admitting: Internal Medicine

## 2017-01-13 ENCOUNTER — Encounter: Payer: Self-pay | Admitting: Internal Medicine

## 2017-01-13 VITALS — BP 120/80 | HR 77 | Resp 16 | Ht 68.0 in | Wt 155.0 lb

## 2017-01-13 DIAGNOSIS — R0600 Dyspnea, unspecified: Secondary | ICD-10-CM

## 2017-01-13 DIAGNOSIS — J449 Chronic obstructive pulmonary disease, unspecified: Secondary | ICD-10-CM

## 2017-01-13 MED ORDER — UMECLIDINIUM BROMIDE 62.5 MCG/INH IN AEPB: 1.0000 | INHALATION_SPRAY | Freq: Every day | RESPIRATORY_TRACT | 0 refills | Status: DC

## 2017-01-13 MED ORDER — FLUTICASONE FUROATE-VILANTEROL 200-25 MCG/INH IN AEPB: 1.0000 | INHALATION_SPRAY | Freq: Every day | RESPIRATORY_TRACT | 0 refills | Status: DC

## 2017-01-13 MED ORDER — UMECLIDINIUM BROMIDE 62.5 MCG/INH IN AEPB: 1.0000 | INHALATION_SPRAY | Freq: Every day | RESPIRATORY_TRACT | 5 refills | Status: DC

## 2017-01-13 MED ORDER — FLUTICASONE FUROATE-VILANTEROL 200-25 MCG/INH IN AEPB: 1.0000 | INHALATION_SPRAY | Freq: Every day | RESPIRATORY_TRACT | 5 refills | Status: DC

## 2017-01-13 NOTE — Addendum Note (Signed)
Addended by: Devona Konig on: 01/13/2017 10:29 AM   Modules accepted: Orders

## 2017-01-13 NOTE — Addendum Note (Signed)
Addended by: Devona Konig on: 01/13/2017 10:54 AM   Modules accepted: Orders

## 2017-01-13 NOTE — Patient Instructions (Signed)
Check 6MWT and check ONO Breo 200 and Incruse

## 2017-01-13 NOTE — Progress Notes (Signed)
Date: 01/13/2017  MRN# [id number] Jenny Cooper [birth year]  PMD - Dr. Gayland Curry Jenny Cooper is a 81 y.o. old female seen in follow up for new LLL mass.   CC:  Chief Complaint  Patient presents with  . Shortness of Breath    Pt states dyspnea worse since Monday. She does have some wheeze, cough with sl. mucus (pale yellow).   Synopsis - 81 year old female first evaluated by pulmonary in early 2016 for chronic cough, found to have right hilar mass, biopsy of mass and pathology specimens positive for squamous cell, stage IIIa. Now status post chemoradiation, recently found to have left lower lobe mass, with chronic cough, chronic antibiotics. Biopsy, EBUS, ENB, of left lower lobe mass negative for malignancy  Events since last clinic visit: Patient presents today for follow-up visit of chronic cough, along with postradiation fibrosis. Has moderate COPD on PFT's 2 years ago Ratio 57% FEV1 58%  Patient states since her last visit,  she noticed that her cough is worse in the morning and congestion, with improvement during the day. She still has postnasal drip.  On prednisone 40 mg now and willtaper to 20 mg when she sees Oncology On steroids for radiation fibrosis Since her last visit she has also had an abalation for A.fib Uses albuterol as needed Has chronic SOB and DOE  Allergies:  Lovenox [enoxaparin sodium] and Meperidine  Review of Systems: Gen:  Denies  fever, sweats, chills HEENT: Denies blurred vision, double vision, ear pain, eye pain, hearing loss, nose bleeds, sore throat. Mild runny nose mostly in the mornings Cvc:  No dizziness, chest pain or heaviness Resp:  Sob, mild wheezing, cough - mainly non productive.  Gi: Denies swallowing difficulty, stomach pain, nausea or vomiting, diarrhea, constipation, bowel incontinence Other:  Right hand and foot numbness  Physical Examination:   VS: BP 120/80 (BP Location: Right Arm, Cuff Size: Normal)   Pulse 77   Resp  16   Ht '5\' 8"'$  (1.727 m)   Wt 155 lb (70.3 kg)   SpO2 92%   BMI 23.57 kg/m   General Appearance: No distress  Neuro:without focal findings, mental status, speech normal, alert and oriented, cranial nerves 2-12 intact, reflexes normal and symmetric, sensation grossly normal - right/left hand - sensation intact, strength 5/5 HEENT: PERRLA, EOM intact, no ptosis, no other lesions noticed; Mallampati 2 Pulmonary: coarse upper airway sounds,no wheezing noted on exam today, good airway entry throughout    Sputum Production:  none CardiovascularNormal S1,S2.  No m/r/g.  Abdominal aorta pulsation normal.       ECHO 03/2014 MILD LV DYSFUNCTION  NORMAL RIGHT VENTRICULAR SYSTOLIC FUNCTION VALVULAR REGURGITATION: MILD MR, TRIVIAL PR, MILD TR PROSTHETIC VALVE(S): PROSTHETIC MV RING    Assessment and Plan: 81 year old female past medical history of chronic cough, moderate Gold stage B COPD, stage IIIa non-small cell lung cancer, status post chemoradiation, seen in follow-up visit for recurrent cough, suspected postradiation pneumonitis/fibrosis with allergic rhinitis with underlying;ying afib with CHF     Radiation pneumonitis (HCC) Currently on 40 mg prednisone and will taper to 20 mg prednisone next week and follow up oncology  Cough Multifactorial: Postradiation pneumonitis, fibrosis, architectural distortion, anatomical distortion of the right mainstem, right hilum inflammation, left lower lobe mass, COPD, Patient will likely have chronic cough, and some level of chronic dyspnea level the persistent post radiation fibrosis in the right lung.   COPD -will start BREo 200 and start Incruse and assess resp status Check 6MWT and  check ONO   CHF afib  continue anticoagulation Follow up cardiology  Follow up in 1 month  Patient satisfied with Plan of action and management. All questions answered  Corrin Parker, M.D.  [facility]  Medical Director  [place] Director [facility] Cardio-Pulmonary Department

## 2017-01-13 NOTE — Progress Notes (Signed)
68 

## 2017-01-17 IMAGING — CR DG PELVIS 3+V JUDET
3 series · 4 of 4 positions shown · non-contrast
Comparison: CT 05/27/2016

CLINICAL DATA: Right pelvic fracture 05/26/2016.  Follow-up.

EXAM:
JUDET PELVIS - 3+ VIEW

[Series 1: pelvis ap · 0.14mm/px · 2 of 2 slices shown]
[im 1/2]
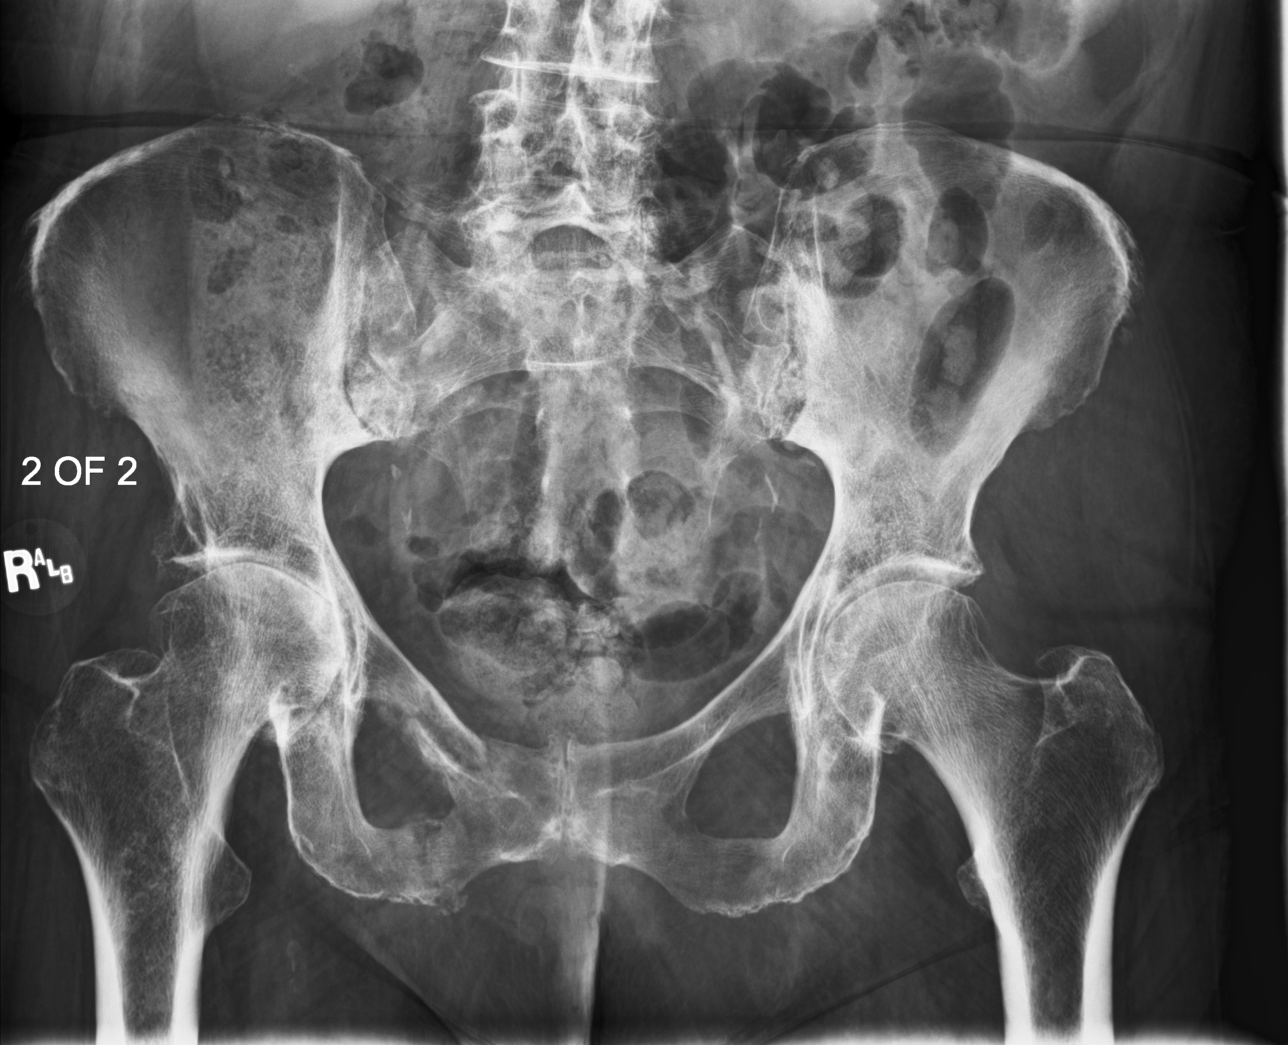
[im 2/2]
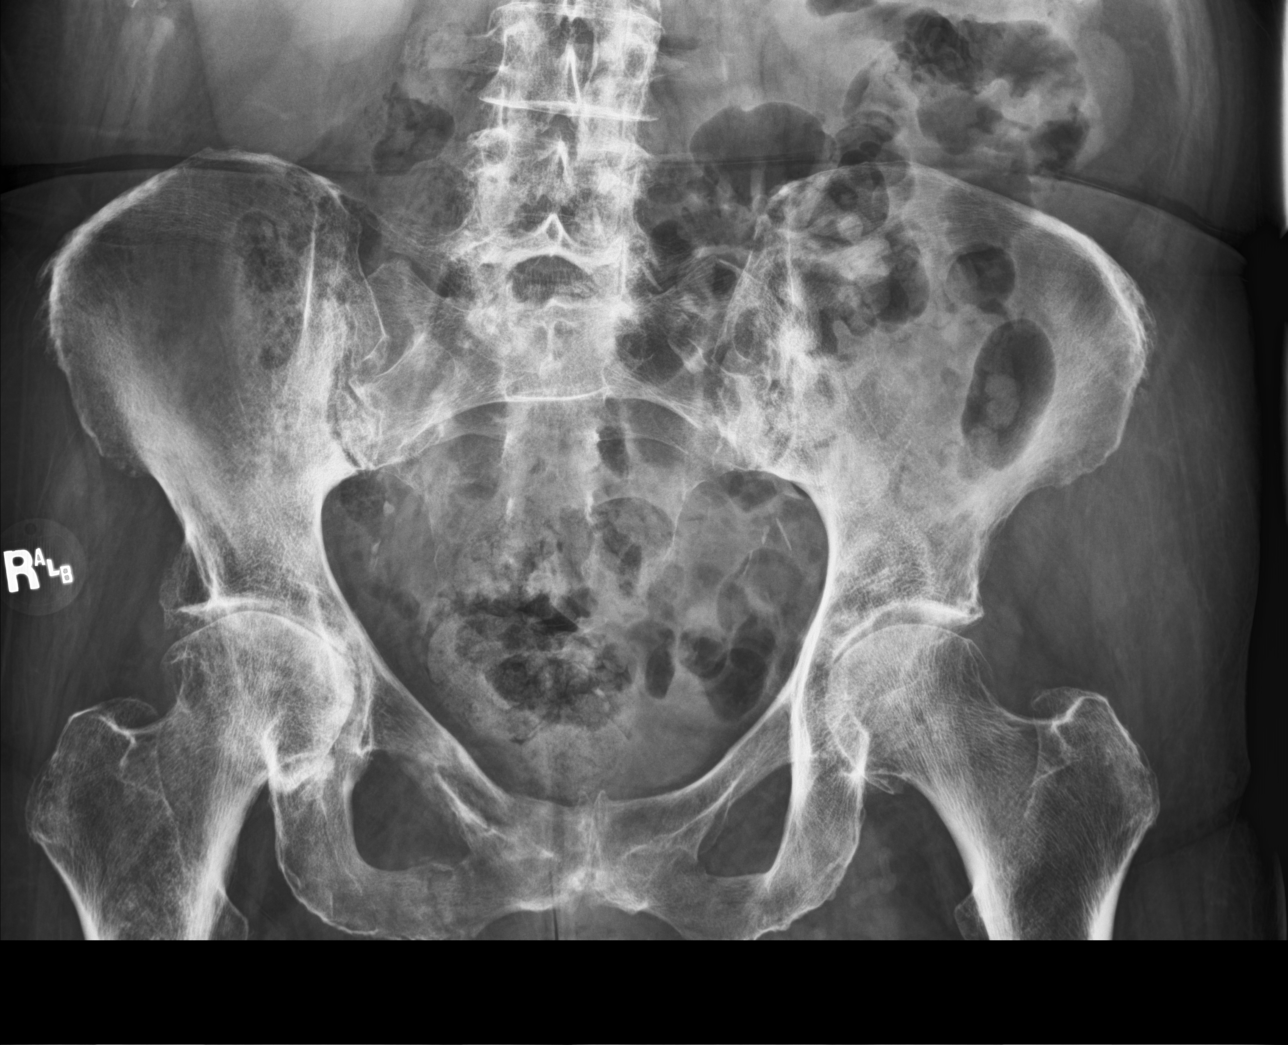

[pelvis obl (1 of 2)]
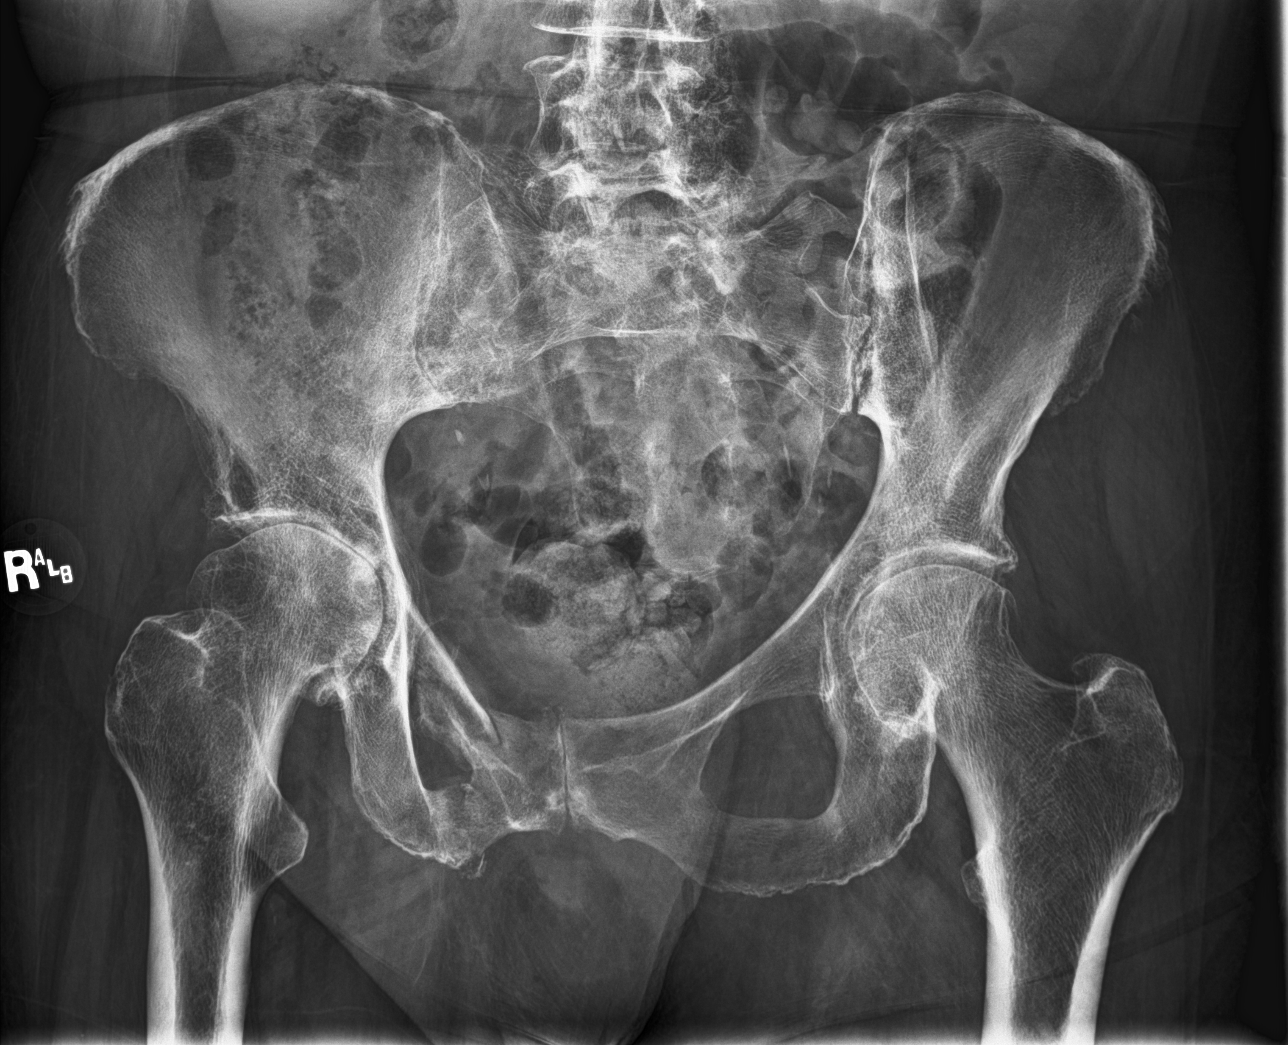

[pelvis obl (2 of 2)]
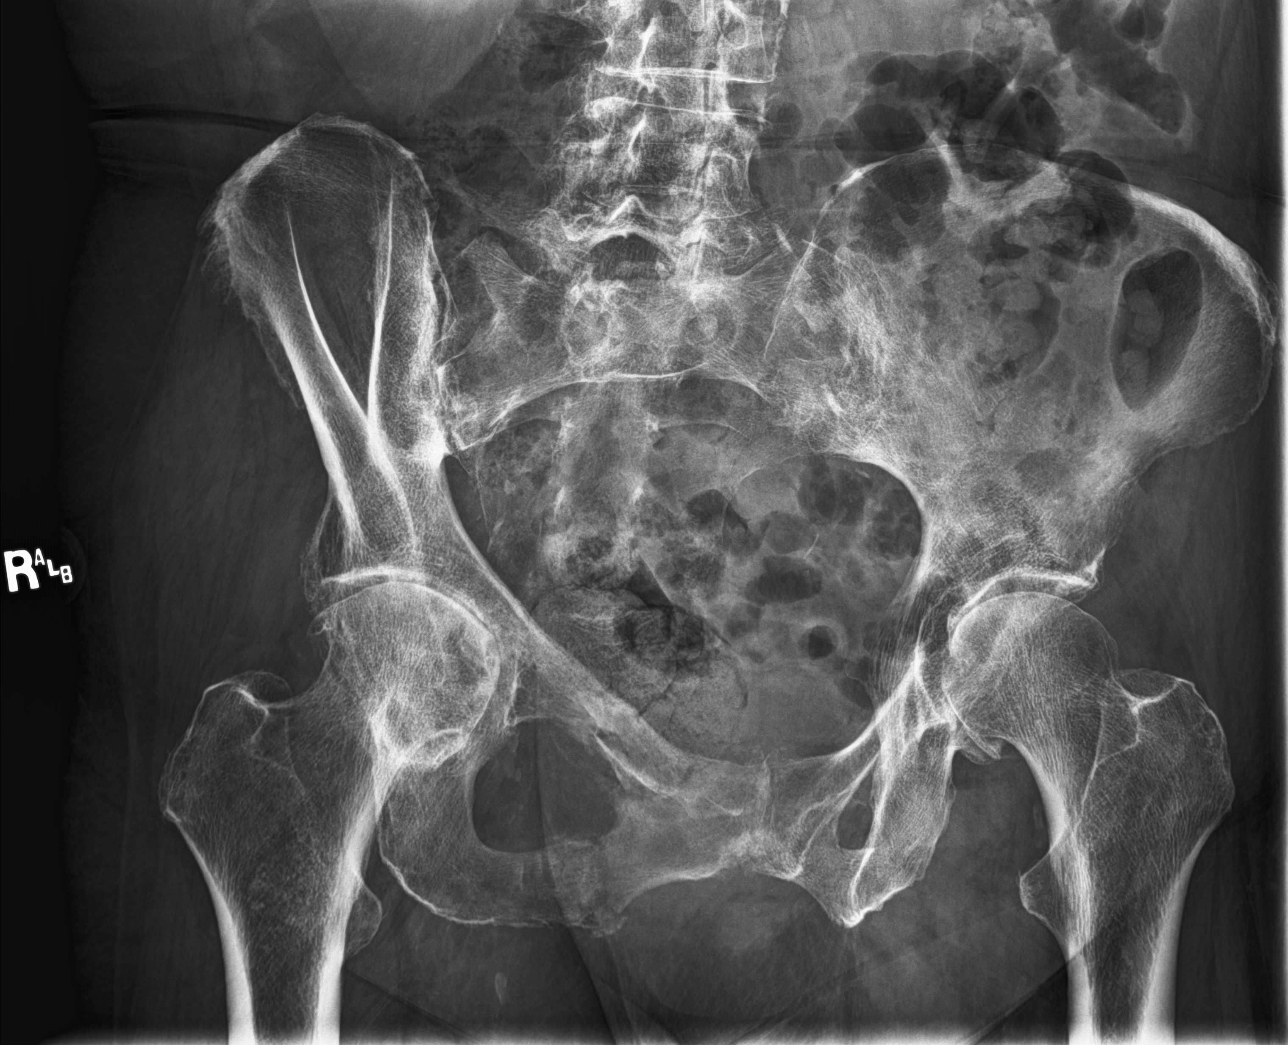

[4 of 4 positions shown; findings below may reference images not displayed]

FINDINGS: Healing fractures noted through the right inferior and superior
pubic rami. The inferior pubic ramus fracture line remains partially
evident.

Advanced degenerative changes in the hips bilaterally.

No acute fracture, subluxation or dislocation.
IMPRESSION: Healing right superior and inferior pubic rami fractures as above.
No acute bony abnormality.

## 2017-01-20 ENCOUNTER — Ambulatory Visit (INDEPENDENT_AMBULATORY_CARE_PROVIDER_SITE_OTHER): Payer: Medicare Other | Admitting: *Deleted

## 2017-01-20 DIAGNOSIS — J449 Chronic obstructive pulmonary disease, unspecified: Secondary | ICD-10-CM

## 2017-01-20 NOTE — Progress Notes (Signed)
SMW performed today.  SIX MIN WALK 01/20/2017  Medications eliquis, calcium, Zyrtec, breo, levothyroxine,metoprolol, prilosec, prednisone, entresto, incruse  Supplimental Oxygen during Test? (L/min) No  Laps 5  Partial Lap (in Meters) 24  Baseline BP (sitting) 126/74  Baseline Heartrate 82  Baseline Dyspnea (Borg Scale) 3  Baseline Fatigue (Borg Scale) 9  Baseline SPO2 94  BP (sitting) 128/70  Heartrate 101  Dyspnea (Borg Scale) 10  Fatigue (Borg Scale) 10  SPO2 99  BP (sitting) 130/80  Heartrate 91  SPO2 99  Stopped or Paused before Six Minutes No  Distance Completed 264  Tech Comments: pt stated that she had burning in her chest during and after the walk. pt walked at a slow pace due to having back surgery a few months ago.

## 2017-01-21 ENCOUNTER — Ambulatory Visit
Admission: RE | Admit: 2017-01-21 | Discharge: 2017-01-21 | Disposition: A | Discharge status: Home / Self Care | Payer: Medicare Other | Source: Ambulatory Visit | Attending: Internal Medicine | Admitting: Internal Medicine

## 2017-01-21 DIAGNOSIS — R928 Other abnormal and inconclusive findings on diagnostic imaging of breast: Secondary | ICD-10-CM | POA: Diagnosis present

## 2017-01-25 ENCOUNTER — Ambulatory Visit
Admission: RE | Admit: 2017-01-25 | Discharge: 2017-01-25 | Disposition: A | Discharge status: Home / Self Care | Payer: Medicare Other | Source: Ambulatory Visit | Attending: Internal Medicine | Admitting: Internal Medicine

## 2017-01-25 ENCOUNTER — Inpatient Hospital Stay (HOSPITAL_BASED_OUTPATIENT_CLINIC_OR_DEPARTMENT_OTHER): Payer: Medicare Other | Admitting: Internal Medicine

## 2017-01-25 VITALS — BP 134/89 | HR 73 | Temp 97.5°F | Wt 157.0 lb

## 2017-01-25 DIAGNOSIS — Z7952 Long term (current) use of systemic steroids: Secondary | ICD-10-CM

## 2017-01-25 DIAGNOSIS — Z85118 Personal history of other malignant neoplasm of bronchus and lung: Secondary | ICD-10-CM | POA: Diagnosis not present

## 2017-01-25 DIAGNOSIS — Y842 Radiological procedure and radiotherapy as the cause of abnormal reaction of the patient, or of later complication, without mention of misadventure at the time of the procedure: Secondary | ICD-10-CM | POA: Diagnosis not present

## 2017-01-25 DIAGNOSIS — J449 Chronic obstructive pulmonary disease, unspecified: Secondary | ICD-10-CM | POA: Diagnosis not present

## 2017-01-25 DIAGNOSIS — E785 Hyperlipidemia, unspecified: Secondary | ICD-10-CM

## 2017-01-25 DIAGNOSIS — B370 Candidal stomatitis: Secondary | ICD-10-CM

## 2017-01-25 DIAGNOSIS — I252 Old myocardial infarction: Secondary | ICD-10-CM

## 2017-01-25 DIAGNOSIS — J984 Other disorders of lung: Secondary | ICD-10-CM | POA: Diagnosis not present

## 2017-01-25 DIAGNOSIS — J90 Pleural effusion, not elsewhere classified: Secondary | ICD-10-CM | POA: Diagnosis not present

## 2017-01-25 DIAGNOSIS — Z809 Family history of malignant neoplasm, unspecified: Secondary | ICD-10-CM

## 2017-01-25 DIAGNOSIS — I2510 Atherosclerotic heart disease of native coronary artery without angina pectoris: Secondary | ICD-10-CM

## 2017-01-25 DIAGNOSIS — Z9889 Other specified postprocedural states: Secondary | ICD-10-CM | POA: Insufficient documentation

## 2017-01-25 DIAGNOSIS — Z951 Presence of aortocoronary bypass graft: Secondary | ICD-10-CM

## 2017-01-25 DIAGNOSIS — T380X5A Adverse effect of glucocorticoids and synthetic analogues, initial encounter: Secondary | ICD-10-CM

## 2017-01-25 DIAGNOSIS — J700 Acute pulmonary manifestations due to radiation: Secondary | ICD-10-CM

## 2017-01-25 DIAGNOSIS — I110 Hypertensive heart disease with heart failure: Secondary | ICD-10-CM

## 2017-01-25 DIAGNOSIS — Z79899 Other long term (current) drug therapy: Secondary | ICD-10-CM

## 2017-01-25 DIAGNOSIS — Z87891 Personal history of nicotine dependence: Secondary | ICD-10-CM | POA: Diagnosis not present

## 2017-01-25 DIAGNOSIS — Z952 Presence of prosthetic heart valve: Secondary | ICD-10-CM

## 2017-01-25 DIAGNOSIS — C3401 Malignant neoplasm of right main bronchus: Secondary | ICD-10-CM

## 2017-01-25 DIAGNOSIS — Z86718 Personal history of other venous thrombosis and embolism: Secondary | ICD-10-CM

## 2017-01-25 DIAGNOSIS — Z923 Personal history of irradiation: Secondary | ICD-10-CM | POA: Diagnosis not present

## 2017-01-25 DIAGNOSIS — I480 Paroxysmal atrial fibrillation: Secondary | ICD-10-CM

## 2017-01-25 DIAGNOSIS — C771 Secondary and unspecified malignant neoplasm of intrathoracic lymph nodes: Secondary | ICD-10-CM | POA: Diagnosis not present

## 2017-01-25 DIAGNOSIS — Z9221 Personal history of antineoplastic chemotherapy: Secondary | ICD-10-CM | POA: Diagnosis not present

## 2017-01-25 DIAGNOSIS — I509 Heart failure, unspecified: Secondary | ICD-10-CM

## 2017-01-25 DIAGNOSIS — Z7901 Long term (current) use of anticoagulants: Secondary | ICD-10-CM

## 2017-01-25 DIAGNOSIS — K219 Gastro-esophageal reflux disease without esophagitis: Secondary | ICD-10-CM

## 2017-01-25 DIAGNOSIS — D039 Melanoma in situ, unspecified: Secondary | ICD-10-CM

## 2017-01-25 MED ORDER — NYSTATIN 100000 UNIT/ML MT SUSP: 5.0000 mL | Freq: Four times a day (QID) | OROMUCOSAL | 0 refills | Status: DC

## 2017-01-25 NOTE — Progress Notes (Signed)
[place] OFFICE PROGRESS NOTE  Patient Care Team: Glean Hess, MD as PCP - General (Internal Medicine) Rockey Situ Kathlene November, MD as Consulting Physician (Cardiology) Vilinda Boehringer, MD (Inactive) as Consulting Physician (Pulmonary Disease) Cammie Sickle, MD as Consulting Physician (Internal Medicine)  Cancer Staging No matching staging information was found for the patient.    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 Biopsy from hilar area and lymph node station 4R was positive for squamous cell carcinoma.  Patient had compression of the right mainstem bronchus because of enlarged lymph node and a mass; [ T4 N1 M0 tumor stage IIIa ].  2. Started on radiation and chemotherapy from October 21, 2014 3. Finished 6 cycles of carboplatinum and Taxol  in March  29 th of 2016,  PET scan shows significant response  4. Started on consolidation chemotherapy.  Patient finished 2 cycles on July 2016 of carboplatin and Taxol. 5. Atrial fibrillation diagnosis in August of 2016 on eloquis 6. Repeat bronchoscopy was negative for any malignancy  In February 2017.  # SEP 7th 2017- CT [facility]- 5cm hilar mass; bil Ground glass opacities.   # Radiation Pneumonitis [Dr.Mungal] on Prednisone  # May 1st 2018-ordered F one testing.       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  female pleasant patient above history of Stage III squamous cell lung cancer status post chemoradiation [finished treatment July 2016]; With a recurrent right-sided pleural effusion status post thoracentesis; without any convincing evidence on recurrence on the PET scan in May 2018 is here for follow-up.  Interim patient was evaluated by pulmonary- currently on Breo; with significant improvement of  her breathing. She does not get significantly short of breath with exertion. She is interested in coming off prednisone. She is currently on 20 of prednisone mg/day.   REVIEW OF SYSTEMS:  A complete 10 point review of system is done which is negative except mentioned above/history of present illness.   PAST MEDICAL HISTORY :  Past Medical History:  Diagnosis Date  . Atypical atrial flutter ([place])    a. s/p ablation 07/27/2013 followed by Dr. Rockey Situ  . CAD (coronary artery disease)    a. s/p MI x 2 in 2002 s/p PCI x 2 in 2002; b. s/p 2v CABG 2002; c. stress echo 07/2004 w/ evi of pos & inf infarct & no evi of ischemia; d. 4/08 dipyridamole scan w/ multiple areas of infarct, no ischemia, EF 49%; e. cath 04/28/15 3v CAD, med Rx rec, no targets for revasc, LM lum irregs, pLAD 30%, 100%, ost-pLCx 60%, [name] 99%, OM2 100%, p-mRCA 90%, [name] 100% L-R collats, VG-mLAD irregs, VG-OM2 oc  . Carcinoma of right lung ([place]) 01/03/2015   a. followed by Dr. Oliva Bustard  . Chronic systolic CHF (congestive heart failure) ([place])    a. echo 03/2015: EF 30-35%, sev ant/inf/pos HK, in mild to mod MR  . Complication of anesthesia    more recently patient oxygen levels do not rebound as quickly  . Compressed spine fracture ([place]) 08/06/2016   lumbar 2, t11, t12  . COPD (chronic obstructive pulmonary disease) ([place])   . Fractured pelvis ([place]) 05/26/2016   2 places  . GERD (gastroesophageal reflux disease)   .  History of blood clots    12/2001  . History of colonoscopy 2013  . History of mammography, screening 2015  . History of Papanicolaou smear of cervix 2013  . HLD (hyperlipidemia)   . HTN (hypertension)   . Hypothyroidism   . Lung cancer ([place])   . Mitral regurgitation    a. s/p mitral ring placement 09/2000; b. echo 09/2010: EF 50%, inf HK, post AK, mild MR, prosthetic mitral valve ring w/ peak gradient of 10 mmHg; b. echo 2/13: EF 50%, mild MR/TR     . Myocardial infarction ([place])    X 2 (LAST ONE IN 2002)  .  Neuropathy   . Pacemaker    a. MDT 2002; b. generator replacement 2013; c. followed by Dr. Omelia Blackwater, MD  . PAF (paroxysmal atrial fibrillation) ([place])    a. on Eliquis   . Personal history of chemotherapy   . Personal history of radiation therapy     PAST SURGICAL HISTORY :   Past Surgical History:  Procedure Laterality Date  . ABLATION  04/2016   [facility]  . APPENDECTOMY    . CARDIAC CATHETERIZATION N/A 04/28/2015   Procedure: Left Heart Cath and Coronary Angiography;  Surgeon: Wellington Hampshire, MD;  Location: [facility];  Service: Cardiovascular;  Laterality: N/A;  . COLONOSCOPY  12/2009   2 small tubular adenomas  . CORONARY ARTERY BYPASS GRAFT  09/2000  . ECTOPIC PREGNANCY SURGERY    . ELECTROMAGNETIC NAVIGATION BROCHOSCOPY N/A 10/06/2015   Procedure: ELECTROMAGNETIC NAVIGATION BRONCHOSCOPY;  Surgeon: Flora Lipps, MD;  Location: [facility];  Service: Cardiopulmonary;  Laterality: N/A;  . ENDOBRONCHIAL ULTRASOUND N/A 10/06/2015   Procedure: ENDOBRONCHIAL ULTRASOUND;  Surgeon: Flora Lipps, MD;  Location: [facility];  Service: Cardiopulmonary;  Laterality: N/A;  . KYPHOPLASTY N/A 09/28/2016   Procedure: KYPHOPLASTY;  Surgeon: Hessie Knows, MD;  Location: [facility];  Service: Orthopedics;  Laterality: N/A;  . PACEMAKER INSERTION  03/2012  . thorocentesis  12/24/2016  . VAGINAL HYSTERECTOMY     partial - left ovary remains    FAMILY HISTORY :   Family History  Problem Relation Age of Onset  . COPD Mother        sister, and brother  . Stroke Maternal Grandmother   . Hypertension Sister   . Hypertension Brother   . Colon cancer Brother        age 71  . Heart attack Neg Hx   . Breast cancer Neg Hx     SOCIAL HISTORY:   Social History  Substance Use Topics  . Smoking status: Former Smoker    Packs/day: 2.00    Years: 42.00    Types: Cigarettes    Quit date: 08/30/2000  . Smokeless tobacco: Never Used     Comment: quit smoking in 08/28/2000  . Alcohol use 6.0 oz/week    10  Glasses of wine per week     Comment: 2 glasses of wine per day    ALLERGIES:  is allergic to lovenox [enoxaparin sodium] and meperidine.  MEDICATIONS:  Current Outpatient Prescriptions  Medication Sig Dispense Refill  . acetaminophen (TYLENOL) 500 MG tablet Take 1,000 mg by mouth every 6 (six) hours.     Marland Kitchen apixaban (ELIQUIS) 5 MG TABS tablet Take 5 mg by mouth 2 (two) times daily.     Marland Kitchen atorvastatin (LIPITOR) 20 MG tablet TAKE 1 TABLET (20 MG TOTAL) BY MOUTH AT BEDTIME. 30 tablet 5  . Calcium Carb-Cholecalciferol (CALCIUM 600-D PO) Take 2 tablets  by mouth every morning.     . Cetirizine HCl 10 MG CAPS Take 1 capsule (10 mg total) by mouth 1 day or 1 dose. (Patient taking differently: Take 1 capsule by mouth daily. ) 30 capsule 5  . fluticasone furoate-vilanterol (BREO ELLIPTA) 200-25 MCG/INH AEPB Inhale 1 puff into the lungs daily. 1 each 5  . furosemide (LASIX) 20 MG tablet Take 1 tablet (20 mg total) by mouth daily as needed. 30 tablet 3  . levothyroxine (SYNTHROID, LEVOTHROID) 75 MCG tablet TAKE 1 TABLET (75 MCG TOTAL) BY MOUTH DAILY BEFORE BREAKFAST. 30 tablet 5  . metoprolol succinate (TOPROL-XL) 25 MG 24 hr tablet TAKE 1 TABLET BY MOUTH EVERY MORNING AND 2 TABS IN THE EVENING 90 tablet 3  . omeprazole (PRILOSEC) 20 MG capsule TAKE 1 CAPSULE (20 MG TOTAL) BY MOUTH EVERY MORNING. 30 capsule 6  . predniSONE (DELTASONE) 20 MG tablet Take 2 tablets once a day x2 weeks; and then once a day; take with food 40 tablet 0  . sacubitril-valsartan (ENTRESTO) 24-26 MG Take 1 tablet by mouth 2 (two) times daily. 60 tablet 6  . senna (SENOKOT) 8.6 MG TABS tablet Take 1 tablet (8.6 mg total) by mouth daily. 30 each 0  . umeclidinium bromide (INCRUSE ELLIPTA) 62.5 MCG/INH AEPB Inhale 1 puff into the lungs daily. 1 each 5  . nystatin (MYCOSTATIN) 100000 UNIT/ML suspension Take 5 mLs (500,000 Units total) by mouth 4 (four) times daily. 60 mL 0   No current facility-administered medications for this  visit.    Facility-Administered Medications Ordered in Other Visits  Medication Dose Route Frequency Provider Last Rate Last Dose  . sodium chloride 0.9 % injection 10 mL  10 mL Intravenous PRN Forest Gleason, MD   10 mL at 02/19/15 1000  . sodium chloride flush (NS) 0.9 % injection 10 mL  10 mL Intravenous PRN Cammie Sickle, MD   10 mL at 02/16/16 1048    PHYSICAL EXAMINATION: ECOG PERFORMANCE STATUS: 0 - Asymptomatic  BP 134/89 (BP Location: Right Arm, Patient Position: Sitting)   Pulse 73   Temp 97.5 F (36.4 C) (Tympanic)   Wt 156 lb 15.5 oz (71.2 kg)   BMI 23.87 kg/m   Filed Weights   01/25/17 1454  Weight: 156 lb 15.5 oz (71.2 kg)    GENERAL: Well-nourished well-developed; Alert, no distress and comfortable. She is alone.  EYES: no pallor or icterus OROPHARYNX: POSITIVE for thrush. no ulceration; good dentition  NECK: supple, no masses felt LYMPH:  no palpable lymphadenopathy in the cervical, axillary or inguinal regions LUNGS: Decreased breath sounds to auscultation at bases.  No wheeze or crackles HEART/CVS: regular rate & rhythm and no murmurs; No lower extremity edema ABDOMEN:abdomen soft, non-tender and normal bowel sounds Musculoskeletal:no cyanosis of digits and no clubbing  PSYCH: alert & oriented x 3 with fluent speech NEURO: no focal motor/sensory deficits SKIN:  no rashes or significant lesions  LABORATORY DATA:  I have reviewed the data as listed    Component Value Date/Time   NA 136 12/14/2016 1134   NA 139 11/17/2015 1526   NA 136 12/24/2014 1457   K 4.0 12/14/2016 1134   K 3.6 12/24/2014 1457   CL 99 (L) 12/14/2016 1134   CL 98 (L) 12/24/2014 1457   CO2 30 12/14/2016 1134   CO2 32 12/24/2014 1457   GLUCOSE 122 (H) 12/14/2016 1134   GLUCOSE 107 (H) 12/24/2014 1457   BUN 8 12/14/2016 1134   BUN 30 (  H) 11/17/2015 1526   BUN 17 12/24/2014 1457   CREATININE 0.91 12/14/2016 1134   CREATININE 1.00 12/24/2014 1457   CALCIUM 9.3 12/14/2016  1134   CALCIUM 9.5 12/24/2014 1457   PROT 7.2 08/16/2016 0945   PROT 7.4 12/24/2014 1457   ALBUMIN 3.8 08/16/2016 0945   ALBUMIN 3.9 12/24/2014 1457   AST 18 08/16/2016 0945   AST 22 12/24/2014 1457   ALT 11 (L) 08/16/2016 0945   ALT 19 12/24/2014 1457   ALKPHOS 78 08/16/2016 0945   ALKPHOS 65 12/24/2014 1457   BILITOT 0.4 08/16/2016 0945   BILITOT 0.4 12/24/2014 1457   GFRNONAA 59 (L) 12/14/2016 1134   GFRNONAA 55 (L) 12/24/2014 1457   GFRAA >60 12/14/2016 1134   GFRAA >60 12/24/2014 1457    No results found for: SPEP, UPEP  Lab Results  Component Value Date   WBC 5.3 12/14/2016   NEUTROABS 2.5 12/14/2016   HGB 15.0 12/14/2016   HCT 46.2 12/14/2016   MCV 90.1 12/14/2016   PLT 227 12/14/2016      Chemistry      Component Value Date/Time   NA 136 12/14/2016 1134   NA 139 11/17/2015 1526   NA 136 12/24/2014 1457   K 4.0 12/14/2016 1134   K 3.6 12/24/2014 1457   CL 99 (L) 12/14/2016 1134   CL 98 (L) 12/24/2014 1457   CO2 30 12/14/2016 1134   CO2 32 12/24/2014 1457   BUN 8 12/14/2016 1134   BUN 30 (H) 11/17/2015 1526   BUN 17 12/24/2014 1457   CREATININE 0.91 12/14/2016 1134   CREATININE 1.00 12/24/2014 1457      Component Value Date/Time   CALCIUM 9.3 12/14/2016 1134   CALCIUM 9.5 12/24/2014 1457   ALKPHOS 78 08/16/2016 0945   ALKPHOS 65 12/24/2014 1457   AST 18 08/16/2016 0945   AST 22 12/24/2014 1457   ALT 11 (L) 08/16/2016 0945   ALT 19 12/24/2014 1457   BILITOT 0.4 08/16/2016 0945   BILITOT 0.4 12/24/2014 1457       RADIOGRAPHIC STUDIES: I have personally reviewed the radiological images as listed and agreed with the findings in the report. Dg Chest 2 View  Result Date: 01/25/2017 CLINICAL DATA:  History of lung cancer treated with chemotherapy and radiation in 2016. Follow-up. EXAM: CHEST  2 VIEW COMPARISON:  12/24/2016 FINDINGS: Power port is unchanged with the tip in the SVC above the right atrium. Dual lead pacemaker appears the same. Previous  mitral valve replacement and CABG. Heart size remains normal. The left lung remains clear. Chronic scarring in the right hilar and perihilar region appear the same. No evidence of developing mass density, infiltrate, collapse or effusion. Multiple previous spinal augmentation procedures. No evidence new fracture or other focal bone finding. IMPRESSION: Stable exam. Postoperative scarring in the right hilar and perihilar region. No evidence of residual or recurrent disease by radiography. Electronically Signed   By: Nelson Chimes M.D.   On: 01/25/2017 16:00     ASSESSMENT & PLAN:  Cancer of hilus of right lung (HCC) Stage III squamous cell lung cancer s/p chemo-RT- 2016. Status post bronchoscopy in February 2017-negative for malignancy; However- April 20th CT scan- large right sided pleural effusion [see discussion below]; Stable- Right hilar mass. May 5th PET scan shows no significant concerns for recurrent malignancy; shows radiation changes; pleural effusion [moderate-C discussion below].  # Reviewed by Dr. Mortimer Fries- no obvious evidence of progression/recurrence of malignancy at this time.   #  Right pleural effusion- status post thoracentesis with symptomatic improvement; LDH normal; likely transudative as per Dr.Kasa. ? CHF. Taper off prednisone or the course of 2 weeks.  # Oral thrush-sec to steroids- recommend nystatin.   # Afib s/p PPM [Dr.Gollan].   # taper steroids over 2 weeks; CXR- neg for any effusion. Will inform pt of results.   # follow up with me in 2 months/labs.   CC: Dr. Rockey Situ; Mortimer Fries.    Orders Placed This Encounter  Procedures  . DG Chest 2 View    Standing Status:   Future    Number of Occurrences:   1    Standing Expiration Date:   03/27/2018    Order Specific Question:   Reason for Exam (SYMPTOM  OR DIAGNOSIS REQUIRED)    Answer:   Hx of right pleural effusion; Hx of lung cancer    Order Specific Question:   Preferred imaging location?    Answer:   [facility]  .  CBC with Differential/Platelet    Standing Status:   Future    Standing Expiration Date:   01/25/2018  . Basic metabolic panel    Standing Status:   Future    Standing Expiration Date:   01/25/2018   All questions were answered. The patient knows to call the clinic with any problems, questions or concerns.      Cammie Sickle, MD 01/25/2017 4:54 PM

## 2017-01-25 NOTE — Assessment & Plan Note (Addendum)
Stage III squamous cell lung cancer s/p chemo-RT- 2016. Status post bronchoscopy in February 2017-negative for malignancy; However- April 20th CT scan- large right sided pleural effusion [see discussion below]; Stable- Right hilar mass. May 5th PET scan shows no significant concerns for recurrent malignancy; shows radiation changes; pleural effusion [moderate-C discussion below].  # Reviewed by Dr. Mortimer Fries- no obvious evidence of progression/recurrence of malignancy at this time.   # Right pleural effusion- status post thoracentesis with symptomatic improvement; LDH normal; likely transudative as per Dr.Kasa. ? CHF. Taper off prednisone or the course of 2 weeks.  # Oral thrush-sec to steroids- recommend nystatin.   # Afib s/p PPM [Dr.Gollan].   # taper steroids over 2 weeks; CXR- neg for any effusion. Will inform pt of results.   # follow up with me in 2 months/labs.   CC: Dr. Rockey Situ; Mortimer Fries.

## 2017-01-26 ENCOUNTER — Encounter: Payer: Self-pay | Admitting: Internal Medicine

## 2017-01-26 DIAGNOSIS — R0600 Dyspnea, unspecified: Secondary | ICD-10-CM

## 2017-01-27 ENCOUNTER — Encounter: Payer: Self-pay | Admitting: Internal Medicine

## 2017-02-01 ENCOUNTER — Telehealth: Payer: Self-pay | Admitting: *Deleted

## 2017-02-01 DIAGNOSIS — J449 Chronic obstructive pulmonary disease, unspecified: Secondary | ICD-10-CM

## 2017-02-01 NOTE — Telephone Encounter (Signed)
Pt informed of results of ONO. Order placed. Nothing further needed.

## 2017-02-01 NOTE — Telephone Encounter (Signed)
Pt returning our call °Please call back ° °

## 2017-02-01 NOTE — Telephone Encounter (Signed)
LMOVM for pt to return call for ONO results. Pt needs 2L QHS.

## 2017-02-03 ENCOUNTER — Encounter: Payer: Self-pay | Admitting: Internal Medicine

## 2017-02-04 ENCOUNTER — Ambulatory Visit: Payer: Medicare Other | Admitting: Radiation Oncology

## 2017-02-04 ENCOUNTER — Encounter: Payer: Self-pay | Admitting: Internal Medicine

## 2017-02-09 ENCOUNTER — Ambulatory Visit (INDEPENDENT_AMBULATORY_CARE_PROVIDER_SITE_OTHER): Payer: Medicare Other | Admitting: Internal Medicine

## 2017-02-09 ENCOUNTER — Encounter: Payer: Self-pay | Admitting: Internal Medicine

## 2017-02-09 VITALS — BP 118/68 | HR 80 | Resp 16 | Ht 68.0 in | Wt 156.0 lb

## 2017-02-09 DIAGNOSIS — J9611 Chronic respiratory failure with hypoxia: Secondary | ICD-10-CM

## 2017-02-09 MED ORDER — UMECLIDINIUM BROMIDE 62.5 MCG/INH IN AEPB: 1.0000 | INHALATION_SPRAY | Freq: Every day | RESPIRATORY_TRACT | 0 refills | Status: DC

## 2017-02-09 NOTE — Progress Notes (Signed)
Date: 02/09/2017  MRN# [id number] Jenny Cooper Jan 24, 1936  PMD - Dr. Gayland Curry Jenny Cooper is a 81 y.o. old female seen in follow up for new LLL mass.   CC:  Chief Complaint  Patient presents with  . COPD    pt on 2 L02 at night   Synopsis - 81 year old female first evaluated by pulmonary in early 2016 for chronic cough, found to have right hilar mass, biopsy of mass and pathology specimens positive for squamous cell, stage IIIa. Now status post chemoradiation, recently found to have left lower lobe mass, with chronic cough, chronic antibiotics. Biopsy, EBUS, ENB, of left lower lobe mass negative for malignancy  Events since last clinic visit: Patient presents today for follow-up visit of chronic cough, along with postradiation fibrosis. Has moderate COPD on PFT's 2 years ago Ratio 57% FEV1 58%   02/09/2017 OV Patient doing well since last visit her shortness of breath and chest congestion have resolved Patient is on prednisone 10 mg every other day as per oncology Patient respirations symptoms improved with BREO 200 andINCRUSE  On steroids for radiation fibrosis Since her last visit she has also had an abalation for A.fib Uses albuterol as needed Has chronic SOB and DOE Her 6 minute walk test was within normal limits Her ONO was positive for hypoxia and is on 2 L nasal cannula at night  Allergies:  Lovenox [enoxaparin sodium] and Meperidine  Review of Systems: Gen:  Denies  fever, sweats, chills HEENT: Denies blurred vision, double vision, ear pain, eye pain, hearing loss, nose bleeds, sore throat. Mild runny nose mostly in the mornings Cvc:  No dizziness, chest pain or heaviness Resp:  Sob, mild wheezing, cough - mainly non productive.  Gi: Denies swallowing difficulty, stomach pain, nausea or vomiting, diarrhea, constipation, bowel incontinence Other:  Right hand and foot numbness  Physical Examination:   VS: BP 118/68 (BP Location: Right Arm, Patient  Position: Sitting, Cuff Size: Normal)   Pulse 80   Resp 16   Ht 5\' 8"  (1.727 m)   Wt 156 lb (70.8 kg)   SpO2 95%   BMI 23.72 kg/m   General Appearance: No distress  Neuro:without focal findings, mental status, speech normal, alert and oriented, cranial nerves 2-12 intact, reflexes normal and symmetric, sensation grossly normal - right/left hand - sensation intact, strength 5/5 HEENT: PERRLA, EOM intact, no ptosis, no other lesions noticed; Mallampati 2 Pulmonary: coarse upper airway sounds,no wheezing noted on exam today, good airway entry throughout    Sputum Production:  none CardiovascularNormal S1,S2.  No m/r/g.  Abdominal aorta pulsation normal.       ECHO 03/2014 MILD LV DYSFUNCTION  NORMAL RIGHT VENTRICULAR SYSTOLIC FUNCTION VALVULAR REGURGITATION: MILD MR, TRIVIAL PR, MILD TR PROSTHETIC VALVE(S): PROSTHETIC MV RING    Assessment and Plan: 81 year old female past medical history of chronic cough, moderate Gold stage B COPD, stage IIIa non-small cell lung cancer, status post chemoradiation, seen in follow-up visit for recurrent cough, suspected postradiation pneumonitis/fibrosis with allergic rhinitis with underlying afib with CHF with chronic hypoxic respiratory failure     Radiation pneumonitis (HCC) Currently on 10 mg prednisone every other day as per oncology   Cough Multifactorial: Postradiation pneumonitis, fibrosis, architectural distortion, anatomical distortion of the right mainstem, right hilum inflammation, left lower lobe mass, COPD, Patient will likely have chronic cough, and some level of chronic dyspnea level the persistent post radiation fibrosis in the right lung.   COPD -will stop BREO  -will continue  Incruse and assess resp status in 6 months   Chronic hypoxic respiratory failure -Continue 2 L oxygen nasal cannula at nighttime  CHF afib  continue anticoagulation Follow up cardiology  Follow up in 6 months  Patient satisfied with Plan of action  and management. All questions answered  Corrin Parker, M.D.  [facility]  Medical Director [place] Director [facility] Cardio-Pulmonary Department

## 2017-02-09 NOTE — Patient Instructions (Signed)
STOP BREO CONTINUE INCRUSE  CONTINUE OXYGEN AS PRESCRIBED

## 2017-02-09 NOTE — Addendum Note (Signed)
Addended by: Devona Konig on: 02/09/2017 10:09 AM   Modules accepted: Orders

## 2017-03-05 DIAGNOSIS — I480 Paroxysmal atrial fibrillation: Secondary | ICD-10-CM | POA: Insufficient documentation

## 2017-03-05 DIAGNOSIS — R296 Repeated falls: Secondary | ICD-10-CM | POA: Insufficient documentation

## 2017-03-05 NOTE — Progress Notes (Signed)
Patient ID: Jenny Cooper, female   DOB: 01/02/1936, 81 y.o.   MRN: [id number] Cardiology Office Note  Date:  03/07/2017   ID:  Jessenya, Berdan [birth year], MRN [id number]  PCP:  Glean Hess, MD   Chief Complaint  Patient presents with  . other    6 month f/u no complaints today. Meds reviewed verbally with pt.    HPI:  81 y.o. female with h/o  CAD  s/p remote history of MI in 2002  2 vessel CABG post stenting in 2002,  mitral valve repair in 2002 secondary to mitral regurgitation,  PAF s/p prior TEE/DCCV on Eliquis,  atypical atrial flutter s/p ablation on 07/27/2013  atrial fibrillation and typical atrial flutter ablation on [facility]/11/2017at [facility] s/p MDT PPM,   COPD,  HTN,   HLD  [facility] on 04/01/15 with increased SOB, PNA, elevated troponin.  Quit smoking in 2001 carcinoma of right lung, has completed chemotherapy  falls, and chronic back pain from collapsed vertebrae Suffered pelvic fracture after fall in September 2017 She presents today to the clinic for follow-up of her atrial fibrillation and CAD  Recent procedures and office visit with [facility] EP Cardioversion at the end of May 2018 for atrial flutter Since that time she has felt relatively well, recent vacation in [place] the past week Reports that she did not feel right at times but unable to put her finger on close going on  She is surprised to appreciate her heart rate is elevated today 130 bpm She uses her apple watch and phone to monitor heart rate Using her watch, heart rate reading 130 bpm which is consistent with our EKG Review of the past several days shows what appears to be paroxysmal tachycardia, heart rates sometimes reaching up to 130 Again she is relatively asymptomatic from these tachycardic episodes  She denies any lightheadedness, shortness of breath, orthostasis or dizziness She reports that she has been compliant with her anticoagulation  Conditioning has declined in general, increasing  leg weakness Does not present today with cane or walker  EKG on today's visit shows atrial flutter with ventricular rate 130 bpm  Other past medical history reviewed Previously on prednisone for shortness of breath, managed by pulmonary  CT scan September 2016 showing improvement of her lung cancer She finished her cancer treatment over the summer 2016, she had chemotherapy and radiation  TEE/DCCV in 2013 for her PAF. In 2014 she was diagnosed with atypical atrial flutter and underwent ablation. underwent PPM generator change in 2013.   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  finished concurrent chemoradiation with carboplatinum and Taxol and PET scan showed significant response.   present to [facility] on 8/2. She complained of increase SOB, cough that was productive of green to yellow sputum, nausea, and vomiting.  required BiPAP for a short time upon her arrival. CXR showed bibasilar airspace disease right greater than left has progressed significantly from the prior study, worrisome for recurrent carcinoma however pneumonia could also have this appearance. CT chest has been ordered.   Troponin was found to be 0.48-->1.83.  Echo showed EF 30-35%, severe anterior and infero/posterior wall HK. Left ventricular function parameters were normal, mild to moderate MR. Left atrium was mildly dilated. RV systolic function was normal. Mild to moderate TR. PASP was moderately to severely elevated at 60 mm Hg.   outpatient cardiac cath on 04/28/2015 that showed 3 vessel CAD with patent SVG to LAD, occluded  SVG to OM, native RCA had severe ISR in the mid-segment and was occluded distally at the sites of previously placed stents. There were left to right collaterals. Moderately reduced LVSF with EF of 35-40%. Mildly elevated LVEDP. There were no good targets for revascularization. medical therapy.   cardiac event monitor to evaluate her Afib burden that showed  50% Afib burden with a peak heart rate of 116, mostly rate controlled. Given this finding her Toprol was titrated up on 9/6 to 50 mg bid.   PMH:   has a past medical history of Atypical atrial flutter ([place]); CAD (coronary artery disease); Carcinoma of right lung ([place]) (01/03/2015); Chronic systolic CHF (congestive heart failure) ([place]); Complication of anesthesia; Compressed spine fracture ([place]) (08/06/2016); COPD (chronic obstructive pulmonary disease) ([place]); Fractured pelvis ([place]) (05/26/2016); GERD (gastroesophageal reflux disease); History of blood clots; History of colonoscopy (2013); History of mammography, screening (2015); History of Papanicolaou smear of cervix (2013); HLD (hyperlipidemia); HTN (hypertension); Hypothyroidism; Lung cancer ([place]); Mitral regurgitation; Myocardial infarction [facility]); Neuropathy; Pacemaker; PAF (paroxysmal atrial fibrillation) ([place]); Personal history of chemotherapy; and Personal history of radiation therapy.  PSH:    Past Surgical History:  Procedure Laterality Date  . ABLATION  04/2016   [facility]  . APPENDECTOMY    . CARDIAC CATHETERIZATION N/A 04/28/2015   Procedure: Left Heart Cath and Coronary Angiography;  Surgeon: Wellington Hampshire, MD;  Location: [place] [facility];  Service: Cardiovascular;  Laterality: N/A;  . COLONOSCOPY  12/2009   2 small tubular adenomas  . CORONARY ARTERY BYPASS GRAFT  09/2000  . ECTOPIC PREGNANCY SURGERY    . ELECTROMAGNETIC NAVIGATION BROCHOSCOPY N/A 10/06/2015   Procedure: ELECTROMAGNETIC NAVIGATION BRONCHOSCOPY;  Surgeon: Flora Lipps, MD;  Location: [facility] ORS;  Service: Cardiopulmonary;  Laterality: N/A;  . ENDOBRONCHIAL ULTRASOUND N/A 10/06/2015   Procedure: ENDOBRONCHIAL ULTRASOUND;  Surgeon: Flora Lipps, MD;  Location: [facility] ORS;  Service: Cardiopulmonary;  Laterality: N/A;  . KYPHOPLASTY N/A 09/28/2016   Procedure: KYPHOPLASTY;  Surgeon: Hessie Knows, MD;  Location: [facility] ORS;  Service: Orthopedics;  Laterality: N/A;  . PACEMAKER  INSERTION  03/2012  . thorocentesis  12/24/2016  . VAGINAL HYSTERECTOMY     partial - left ovary remains    Current Outpatient Prescriptions  Medication Sig Dispense Refill  . acetaminophen (TYLENOL) 500 MG tablet Take 1,000 mg by mouth every 6 (six) hours.     Marland Kitchen apixaban (ELIQUIS) 5 MG TABS tablet Take 5 mg by mouth 2 (two) times daily.     Marland Kitchen atorvastatin (LIPITOR) 20 MG tablet TAKE 1 TABLET (20 MG TOTAL) BY MOUTH AT BEDTIME. 30 tablet 5  . Calcium Carb-Cholecalciferol (CALCIUM 600-D PO) Take 2 tablets by mouth every morning.     . Cetirizine HCl 10 MG CAPS Take 1 capsule (10 mg total) by mouth 1 day or 1 dose. (Patient taking differently: Take 1 capsule by mouth daily. ) 30 capsule 5  . furosemide (LASIX) 20 MG tablet Take 1 tablet (20 mg total) by mouth daily as needed. 30 tablet 3  . levothyroxine (SYNTHROID, LEVOTHROID) 75 MCG tablet TAKE 1 TABLET (75 MCG TOTAL) BY MOUTH DAILY BEFORE BREAKFAST. 30 tablet 5  . metoprolol succinate (TOPROL-XL) 25 MG 24 hr tablet TAKE 1 TABLET BY MOUTH EVERY MORNING AND 2 TABS IN THE EVENING 90 tablet 3  . omeprazole (PRILOSEC) 20 MG capsule TAKE 1 CAPSULE (20 MG TOTAL) BY MOUTH EVERY MORNING. (Patient taking differently: Take 20 mg by mouth every other day. ) 30 capsule 6  .  sacubitril-valsartan (ENTRESTO) 24-26 MG Take 1 tablet by mouth 2 (two) times daily. 60 tablet 6  . senna (SENOKOT) 8.6 MG TABS tablet Take 1 tablet (8.6 mg total) by mouth daily. 30 each 0  . umeclidinium bromide (INCRUSE ELLIPTA) 62.5 MCG/INH AEPB Inhale 1 puff into the lungs daily. 1 each 0  . amiodarone (PACERONE) 200 MG tablet Take 1 tablet (200 mg total) by mouth as directed. Take 2 tablets by mouth twice daily for 5 days, then 1 tablet by mouth twice daily 180 tablet 3   No current facility-administered medications for this visit.    Facility-Administered Medications Ordered in Other Visits  Medication Dose Route Frequency Provider Last Rate Last Dose  . sodium chloride 0.9 %  injection 10 mL  10 mL Intravenous PRN Forest Gleason, MD   10 mL at 02/19/15 1000  . sodium chloride flush (NS) 0.9 % injection 10 mL  10 mL Intravenous PRN Cammie Sickle, MD   10 mL at 02/16/16 1048     Allergies:   Lovenox [enoxaparin sodium] and Meperidine   Social History:  The patient  reports that she quit smoking about 16 years ago. Her smoking use included Cigarettes. She has a 84.00 pack-year smoking history. She has never used smokeless tobacco. She reports that she drinks about 6.0 oz of alcohol per week . She reports that she does not use drugs.   Family History:   family history includes COPD in her mother; Colon cancer in her brother; Hypertension in her brother and sister; Stroke in her maternal grandmother.    Review of Systems: Review of Systems  Constitutional: Positive for malaise/fatigue.  Respiratory: Negative.   Cardiovascular: Negative.   Gastrointestinal: Negative.   Musculoskeletal: Positive for back pain.       Leg weakness  Neurological: Negative.   Psychiatric/Behavioral: Negative.   All other systems reviewed and are negative.    PHYSICAL EXAM: VS:  BP 104/80 (BP Location: Left Arm, Patient Position: Sitting, Cuff Size: Normal)   Pulse (!) 131   Ht 5\' 6"  (1.676 m)   Wt 162 lb 4 oz (73.6 kg)   BMI 26.19 kg/m  , BMI Body mass index is 26.19 kg/m. GEN:  in no acute distress , frail HEENT: normal  Neck: no JVD, carotid bruits, or masses Cardiac: Regular, rapid, no murmurs, rubs, or gallops,no edema  Respiratory:  Mildly decreased breath sounds throughout, normal work of breathing GI: soft, nontender, nondistended, + BS MS: no deformity or atrophy  Skin: warm and dry, no rash Neuro:  Strength and sensation are intact Psych: euthymic mood, full affect    Recent Labs: 03/10/2016: TSH 1.840 05/25/2016: Magnesium 2.1 08/16/2016: ALT 11 12/14/2016: BUN 8; Creatinine, Ser 0.91; Hemoglobin 15.0; Platelets 227; Potassium 4.0; Sodium 136     Lipid Panel Lab Results  Component Value Date   CHOL 128 02/16/2016   HDL 56 02/16/2016   LDLCALC 45 02/16/2016   TRIG 133 02/16/2016      Wt Readings from Last 3 Encounters:  03/07/17 162 lb 4 oz (73.6 kg)  03/07/17 162 lb 2.4 oz (73.5 kg)  02/09/17 156 lb (70.8 kg)       ASSESSMENT AND PLAN:  Essential hypertension -  Blood pressure is well controlled on today's visit. No changes made to the medications.  Paroxysmal atrial fibrillation (HCC) - Recent ablation at [facility] On anticoagulation  Atrial flutter Noted on EKG today Case discussed on the phone with Dr. Glennon Mac, EP at [facility] We  will recommend she start amiodarone 400 mg twice a day for 5 days then down to 200 mg twice a day If she is not willing to start the antiarrhythmic, we would recommend cardioversion with follow-up at [facility] for consideration of starting Tikosyn as an inpatient We will recommend a event monitor (with real-time tracking) to allow observation of her rate and arrhythmia burden. These results can be relayed to the [place] team  Hyperlipidemia - Encouraged her to stay on her Lipitor  Chronic systolic CHF (congestive heart failure) ([place]) Appears euvolemic but suggested she may want to take extra Lasix given several pound weight gain over the past month, especially in the setting of her underlying arrhythmia with rapid rate Suggested she moderate her fluid intake  DM type 2, goal A1c below 7 Continue aggressive diet Unable to exercise  COPD exacerbation ([place]) Previously treated with prednisone Followed by pulmonary She reports stable symptoms  Chronic back pain Completed physical therapy, scheduled to see orthopedics   Total encounter time more than 45 minutes  Greater than 50% was spent in counseling and coordination of care with the patient   Disposition:   F/U  6 months   Orders Placed This Encounter  Procedures  . Cardiac event monitor  . EKG 12-Lead     Signed, Esmond Plants,  M.D., Ph.D. 03/07/2017  [place], [place]

## 2017-03-07 ENCOUNTER — Encounter: Payer: Self-pay | Admitting: Radiation Oncology

## 2017-03-07 ENCOUNTER — Ambulatory Visit
Admission: RE | Admit: 2017-03-07 | Discharge: 2017-03-07 | Disposition: A | Discharge status: Home / Self Care | Payer: Medicare Other | Source: Ambulatory Visit | Attending: Radiation Oncology | Admitting: Radiation Oncology

## 2017-03-07 ENCOUNTER — Ambulatory Visit (INDEPENDENT_AMBULATORY_CARE_PROVIDER_SITE_OTHER): Payer: Medicare Other | Admitting: Cardiovascular Disease

## 2017-03-07 ENCOUNTER — Encounter: Payer: Self-pay | Admitting: Cardiovascular Disease

## 2017-03-07 VITALS — BP 104/80 | HR 131 | Ht 66.0 in | Wt 162.2 lb

## 2017-03-07 VITALS — BP 114/78 | HR 110 | Temp 96.5°F | Resp 22 | Wt 162.1 lb

## 2017-03-07 DIAGNOSIS — Z85118 Personal history of other malignant neoplasm of bronchus and lung: Secondary | ICD-10-CM | POA: Insufficient documentation

## 2017-03-07 DIAGNOSIS — Z9981 Dependence on supplemental oxygen: Secondary | ICD-10-CM | POA: Diagnosis not present

## 2017-03-07 DIAGNOSIS — J432 Centrilobular emphysema: Secondary | ICD-10-CM

## 2017-03-07 DIAGNOSIS — I483 Typical atrial flutter: Secondary | ICD-10-CM

## 2017-03-07 DIAGNOSIS — E119 Type 2 diabetes mellitus without complications: Secondary | ICD-10-CM

## 2017-03-07 DIAGNOSIS — I2510 Atherosclerotic heart disease of native coronary artery without angina pectoris: Secondary | ICD-10-CM

## 2017-03-07 DIAGNOSIS — R296 Repeated falls: Secondary | ICD-10-CM | POA: Diagnosis not present

## 2017-03-07 DIAGNOSIS — J90 Pleural effusion, not elsewhere classified: Secondary | ICD-10-CM | POA: Diagnosis not present

## 2017-03-07 DIAGNOSIS — I25810 Atherosclerosis of coronary artery bypass graft(s) without angina pectoris: Secondary | ICD-10-CM

## 2017-03-07 DIAGNOSIS — Z923 Personal history of irradiation: Secondary | ICD-10-CM | POA: Diagnosis not present

## 2017-03-07 DIAGNOSIS — C3491 Malignant neoplasm of unspecified part of right bronchus or lung: Secondary | ICD-10-CM

## 2017-03-07 DIAGNOSIS — I480 Paroxysmal atrial fibrillation: Secondary | ICD-10-CM | POA: Diagnosis not present

## 2017-03-07 DIAGNOSIS — E782 Mixed hyperlipidemia: Secondary | ICD-10-CM | POA: Diagnosis not present

## 2017-03-07 MED ORDER — AMIODARONE HCL 200 MG PO TABS: 200.0000 mg | ORAL_TABLET | Freq: Two times a day (BID) | ORAL | 3 refills | Status: DC

## 2017-03-07 MED ORDER — AMIODARONE HCL 200 MG PO TABS: 200.0000 mg | ORAL_TABLET | ORAL | 3 refills | Status: DC

## 2017-03-07 NOTE — Patient Instructions (Addendum)
  We will fax EKG to Dr. Glennon Mac and we have called him Options include cardioversion, And/or repeat ablation  Medication Instructions:   Please increase your lasix maybe daily or every other day for weight gain  Labwork:  No new labs needed  Testing/Procedures:  30 day event monitor   Follow-Up: It was a pleasure seeing you in the office today. Please call us if you have new issues that need to be addressed before your next appt.  [phone]  Your physician wants you to follow-up in:  1 month   If you need a refill on your cardiac medications before your next appointment, please call your pharmacy.

## 2017-03-07 NOTE — Progress Notes (Signed)
Radiation Oncology Follow up Note  Name: Jenny Cooper   Date:   03/07/2017 MRN:  [id number] DOB: [birth year]    This 81 y.o. female presents to the clinic today for a 2 year follow-up status post concurrent chemoradiation for stage IIIa squamous cell carcinoma the right lung hilum.  REFERRING PROVIDER: Glean Hess, MD  HPI: Patient is a 81 year old female now out 2 years having completed radiation therapy with concurrent chemotherapy for stage IIIa squamous cell carcinoma of the right lung hilum. She seen today in routine follow-up and is doing fairly well she has had a pelvic fracture secondary to traumatic fall as well some compression or compression fractures in her spine. She is recently been put on nasal oxygen at home. She specifically denies cough hemoptysis or chest tightness.. She has developed a right-sided pleural effusion. PET CT back in May showed no significant concerns for recurrent malignancy pleural effusion as discussed above.  COMPLICATIONS OF TREATMENT: none  FOLLOW UP COMPLIANCE: keeps appointments   PHYSICAL EXAM:  BP 114/78   Pulse (!) 110   Temp (!) 96.5 F (35.8 C)   Resp (!) 22   Wt 162 lb 2.4 oz (73.5 kg)   BMI 24.65 kg/m  Well-developed well-nourished patient in NAD. HEENT reveals PERLA, EOMI, discs not visualized.  Oral cavity is clear. No oral mucosal lesions are identified. Neck is clear without evidence of cervical or supraclavicular adenopathy. Lungs are clear to A&P. Cardiac examination is essentially unremarkable with regular rate and rhythm without murmur rub or thrill. Abdomen is benign with no organomegaly or masses noted. Motor sensory and DTR levels are equal and symmetric in the upper and lower extremities. Cranial nerves II through XII are grossly intact. Proprioception is intact. No peripheral adenopathy or edema is identified. No motor or sensory levels are noted. Crude visual fields are within normal range.  RADIOLOGY RESULTS: PET CT  scan and chest x-rays reviewed compatible above-stated findings  PLAN: Present time she continues to do well with no evidence of progressive or recurrent disease. Somewhat concerning about her pleural effusion. She continues on nasal oxygen at night. I have asked to see her back in 1 year for follow-up. She continues close follow-up care with medical oncology.  I would like to take this opportunity to thank you for allowing me to participate in the care of your patient.Armstead Peaks., MD

## 2017-03-08 ENCOUNTER — Telehealth: Payer: Self-pay | Admitting: Cardiovascular Disease

## 2017-03-08 NOTE — Telephone Encounter (Signed)
Pt was advised to call every day with HR   Today was 84-110

## 2017-03-08 NOTE — Telephone Encounter (Signed)
Pt called to report HR ranging 84-110bpm today before taking amiodarone. She continues to take metoprolol 25mg  in the AM and 50mg  in the PM. States Dr. Rockey Situ told her to call every day and report her heart rate. She will continue to take medications as prescribed. Will route to MD

## 2017-03-09 NOTE — Telephone Encounter (Signed)
Pt calling today with her HR readings  87-102  She also states she will start tomorrow and send them through to mychart.

## 2017-03-10 ENCOUNTER — Encounter: Payer: Self-pay | Admitting: Cardiovascular Disease

## 2017-03-10 NOTE — Telephone Encounter (Signed)
Good numbers so far Would stay on amiodarone as we schedule 400 BID 5 days then 200 mg po BID until she sees EP doctor at [facility]

## 2017-03-11 NOTE — Telephone Encounter (Signed)
Spoke w/ pt.  Advised her of Dr. Donivan Scull recommendation.  She is agreeable and appreciative of the call.

## 2017-03-11 NOTE — Telephone Encounter (Signed)
Left message for pt to call back  °

## 2017-03-12 ENCOUNTER — Encounter (INDEPENDENT_AMBULATORY_CARE_PROVIDER_SITE_OTHER): Payer: Medicare Other

## 2017-03-12 DIAGNOSIS — I2510 Atherosclerotic heart disease of native coronary artery without angina pectoris: Secondary | ICD-10-CM | POA: Diagnosis not present

## 2017-03-12 DIAGNOSIS — J432 Centrilobular emphysema: Secondary | ICD-10-CM

## 2017-03-12 DIAGNOSIS — I25810 Atherosclerosis of coronary artery bypass graft(s) without angina pectoris: Secondary | ICD-10-CM | POA: Diagnosis not present

## 2017-03-12 DIAGNOSIS — I480 Paroxysmal atrial fibrillation: Secondary | ICD-10-CM | POA: Diagnosis not present

## 2017-03-12 DIAGNOSIS — I483 Typical atrial flutter: Secondary | ICD-10-CM

## 2017-03-12 DIAGNOSIS — E782 Mixed hyperlipidemia: Secondary | ICD-10-CM | POA: Diagnosis not present

## 2017-03-12 DIAGNOSIS — E119 Type 2 diabetes mellitus without complications: Secondary | ICD-10-CM

## 2017-03-12 DIAGNOSIS — R296 Repeated falls: Secondary | ICD-10-CM

## 2017-03-15 ENCOUNTER — Ambulatory Visit: Payer: Medicare Other | Admitting: Radiation Oncology

## 2017-03-15 ENCOUNTER — Inpatient Hospital Stay: Payer: Medicare Other | Attending: Internal Medicine | Admitting: Internal Medicine

## 2017-03-15 ENCOUNTER — Inpatient Hospital Stay: Payer: Medicare Other

## 2017-03-15 ENCOUNTER — Other Ambulatory Visit: Payer: Self-pay | Admitting: Internal Medicine

## 2017-03-15 ENCOUNTER — Ambulatory Visit
Admission: RE | Admit: 2017-03-15 | Discharge: 2017-03-15 | Disposition: A | Discharge status: Home / Self Care | Payer: Medicare Other | Source: Ambulatory Visit | Attending: Internal Medicine | Admitting: Internal Medicine

## 2017-03-15 VITALS — BP 99/62 | HR 79 | Temp 98.0°F | Resp 16 | Wt 163.9 lb

## 2017-03-15 DIAGNOSIS — I110 Hypertensive heart disease with heart failure: Secondary | ICD-10-CM | POA: Insufficient documentation

## 2017-03-15 DIAGNOSIS — Z87891 Personal history of nicotine dependence: Secondary | ICD-10-CM

## 2017-03-15 DIAGNOSIS — Z951 Presence of aortocoronary bypass graft: Secondary | ICD-10-CM | POA: Diagnosis not present

## 2017-03-15 DIAGNOSIS — Z7901 Long term (current) use of anticoagulants: Secondary | ICD-10-CM | POA: Diagnosis not present

## 2017-03-15 DIAGNOSIS — E039 Hypothyroidism, unspecified: Secondary | ICD-10-CM | POA: Diagnosis not present

## 2017-03-15 DIAGNOSIS — Z923 Personal history of irradiation: Secondary | ICD-10-CM | POA: Diagnosis not present

## 2017-03-15 DIAGNOSIS — C3401 Malignant neoplasm of right main bronchus: Secondary | ICD-10-CM

## 2017-03-15 DIAGNOSIS — M545 Low back pain, unspecified: Secondary | ICD-10-CM

## 2017-03-15 DIAGNOSIS — J700 Acute pulmonary manifestations due to radiation: Secondary | ICD-10-CM | POA: Insufficient documentation

## 2017-03-15 DIAGNOSIS — E785 Hyperlipidemia, unspecified: Secondary | ICD-10-CM | POA: Insufficient documentation

## 2017-03-15 DIAGNOSIS — M546 Pain in thoracic spine: Secondary | ICD-10-CM | POA: Insufficient documentation

## 2017-03-15 DIAGNOSIS — Z800 Family history of malignant neoplasm of digestive organs: Secondary | ICD-10-CM | POA: Diagnosis not present

## 2017-03-15 DIAGNOSIS — I5022 Chronic systolic (congestive) heart failure: Secondary | ICD-10-CM | POA: Diagnosis not present

## 2017-03-15 DIAGNOSIS — Z9221 Personal history of antineoplastic chemotherapy: Secondary | ICD-10-CM | POA: Diagnosis not present

## 2017-03-15 DIAGNOSIS — I700 Atherosclerosis of aorta: Secondary | ICD-10-CM | POA: Insufficient documentation

## 2017-03-15 DIAGNOSIS — M4317 Spondylolisthesis, lumbosacral region: Secondary | ICD-10-CM | POA: Insufficient documentation

## 2017-03-15 DIAGNOSIS — Z86718 Personal history of other venous thrombosis and embolism: Secondary | ICD-10-CM | POA: Insufficient documentation

## 2017-03-15 DIAGNOSIS — M47897 Other spondylosis, lumbosacral region: Secondary | ICD-10-CM | POA: Diagnosis not present

## 2017-03-15 DIAGNOSIS — Z87311 Personal history of (healed) other pathological fracture: Secondary | ICD-10-CM | POA: Insufficient documentation

## 2017-03-15 DIAGNOSIS — Z9889 Other specified postprocedural states: Secondary | ICD-10-CM | POA: Diagnosis not present

## 2017-03-15 DIAGNOSIS — Z79899 Other long term (current) drug therapy: Secondary | ICD-10-CM | POA: Insufficient documentation

## 2017-03-15 DIAGNOSIS — J449 Chronic obstructive pulmonary disease, unspecified: Secondary | ICD-10-CM | POA: Diagnosis not present

## 2017-03-15 DIAGNOSIS — J90 Pleural effusion, not elsewhere classified: Secondary | ICD-10-CM | POA: Insufficient documentation

## 2017-03-15 DIAGNOSIS — K219 Gastro-esophageal reflux disease without esophagitis: Secondary | ICD-10-CM | POA: Diagnosis not present

## 2017-03-15 DIAGNOSIS — I252 Old myocardial infarction: Secondary | ICD-10-CM | POA: Diagnosis not present

## 2017-03-15 DIAGNOSIS — I480 Paroxysmal atrial fibrillation: Secondary | ICD-10-CM | POA: Insufficient documentation

## 2017-03-15 DIAGNOSIS — Z950 Presence of cardiac pacemaker: Secondary | ICD-10-CM | POA: Diagnosis not present

## 2017-03-15 DIAGNOSIS — I2510 Atherosclerotic heart disease of native coronary artery without angina pectoris: Secondary | ICD-10-CM | POA: Insufficient documentation

## 2017-03-15 DIAGNOSIS — R918 Other nonspecific abnormal finding of lung field: Secondary | ICD-10-CM | POA: Insufficient documentation

## 2017-03-15 LAB — CBC WITH DIFFERENTIAL/PLATELET
BASOS ABS: 0 10*3/uL (ref 0–0.1)
Basophils Relative: 1 %
Eosinophils Absolute: 0.4 10*3/uL (ref 0–0.7)
Eosinophils Relative: 8 %
HCT: 41.5 % (ref 35.0–47.0)
Hemoglobin: 13.9 g/dL (ref 12.0–16.0)
Lymphocytes Relative: 19 %
Lymphs Abs: 1 10*3/uL (ref 1.0–3.6)
MCH: 29.6 pg (ref 26.0–34.0)
MCHC: 33.5 g/dL (ref 32.0–36.0)
MCV: 88.4 fL (ref 80.0–100.0)
MONO ABS: 0.7 10*3/uL (ref 0.2–0.9)
MONOS PCT: 14 %
Neutro Abs: 3 10*3/uL (ref 1.4–6.5)
Neutrophils Relative %: 58 %
PLATELETS: 232 10*3/uL (ref 150–440)
RBC: 4.7 MIL/uL (ref 3.80–5.20)
RDW: 17.8 % — AB (ref 11.5–14.5)
WBC: 5.2 10*3/uL (ref 3.6–11.0)

## 2017-03-15 LAB — BASIC METABOLIC PANEL
ANION GAP: 8 (ref 5–15)
BUN: 13 mg/dL (ref 6–20)
CALCIUM: 9.2 mg/dL (ref 8.9–10.3)
CO2: 32 mmol/L (ref 22–32)
CREATININE: 1.12 mg/dL — AB (ref 0.44–1.00)
Chloride: 98 mmol/L — ABNORMAL LOW (ref 101–111)
GFR calc Af Amer: 53 mL/min — ABNORMAL LOW (ref >60)
GFR, EST NON AFRICAN AMERICAN: 45 mL/min — AB (ref >60)
GLUCOSE: 136 mg/dL — AB (ref 65–99)
Potassium: 4 mmol/L (ref 3.5–5.1)
Sodium: 138 mmol/L (ref 135–145)

## 2017-03-15 NOTE — Assessment & Plan Note (Addendum)
Stage III squamous cell lung cancer s/p chemo-RT- 2016. Status post bronchoscopy in February 2017-negative for malignancy; May 5th PET scan shows no significant concerns for recurrent malignancy; shows radiation changes; pleural effusion [moderate-C discussion below].  # Recent Right pleural effusion [may 2018]- status post thoracentesis with symptomatic improvement;likely from CHF.  # Worsening shortness of breath- question worsening pleural effusion.; Question CHF. Recommend a chest x-ray ASAP. If abnormal would recommend thoracentesis/ increasing Lasix.  # Worsening of thoracolumbar pain- history of compression fractures. Reviewed the recent PET scan no evidence of active disease in May 2018. Recommend x-rays ASAP  # Afib s/p PPM [Dr.Gollan]; currently wearing a monitor.   # follow up with me in 2 months/ no labs. We'll call patients with the x-ray results/next plan of care.   CC: Dr. Rockey Situ; Jenny Cooper.

## 2017-03-15 NOTE — Progress Notes (Signed)
Jenny Cooper OFFICE PROGRESS NOTE  Patient Care Team: Glean Hess, MD as PCP - General (Internal Medicine) Rockey Situ Kathlene November, MD as Consulting Physician (Cardiology) Vilinda Boehringer, MD (Inactive) as Consulting Physician (Pulmonary Disease) Cammie Sickle, MD as Consulting Physician (Internal Medicine)  Cancer Staging No matching staging information was found for the patient.    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 Biopsy from hilar area and lymph node station 4R was positive for squamous cell carcinoma.  Patient had compression of the right mainstem bronchus because of enlarged lymph node and a mass; [ T4 N1 M0 tumor stage IIIa ].  2. Started on radiation and chemotherapy from October 21, 2014 3. Finished 6 cycles of carboplatinum and Taxol  in March  29 th of 2016,  PET scan shows significant response  4. Started on consolidation chemotherapy.  Patient finished 2 cycles on July 2016 of carboplatin and Taxol. 5. Atrial fibrillation diagnosis in August of 2016 on eloquis 6. Repeat bronchoscopy was negative for any malignancy  In February 2017.  # SEP 7th 2017- CT [facility]- 5cm hilar mass; bil Ground glass opacities.   # Radiation Pneumonitis [Dr.Mungal] on Prednisone  # May 1st 2018- F one- No targettable mutations; Int-TMB; ? 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  female pleasant patient above history of Stage III squamous cell lung cancer status post chemoradiation [finished treatment July 2016]; With a recurrent right-sided pleural effusion status post thoracentesis [may 2018]; without any convincing evidence on recurrence on the PET scan is here for follow-up.  Patient complains of worsening shortness of breath again in the last  1-2 weeks. Worse with exertion. Nonproductive cough. She is currently off prednisone. She also contains a worsening pain in the lower thoracic/lumbar region. No radiation. Denies any weakness in the legs. Positive for orthopnea no swelling in the legs. Positive for 6 pound weight gain in the last 2 months. Patient on Lasix  In the interim patient is also currently on Holter monitor- thru cardiology for A. fib.  REVIEW OF SYSTEMS:  A complete 10 point review of system is done which is negative except mentioned above/history of present illness.   PAST MEDICAL HISTORY :  Past Medical History:  Diagnosis Date  . Atypical atrial flutter ([place])    a. s/p ablation 07/27/2013 followed by Dr. Rockey Situ  . CAD (coronary artery disease)    a. s/p MI x 2 in 2002 s/p PCI x 2 in 2002; b. s/p 2v CABG 2002; c. stress echo 07/2004 w/ evi of pos & inf infarct & no evi of ischemia; d. 4/08 dipyridamole scan w/ multiple areas of infarct, no ischemia, EF 49%; e. cath 04/28/15 3v CAD, med Rx rec, no targets for revasc, LM lum irregs, pLAD 30%, 100%, ost-pLCx 60%, [name] 99%, OM2 100%, p-mRCA 90%, [name] 100% L-R collats, VG-mLAD irregs, VG-OM2 oc  . Carcinoma of right lung ([place]) 01/03/2015   a. followed by Dr. Oliva Bustard  . Chronic systolic CHF (congestive heart failure) ([place])    a. echo 03/2015: EF 30-35%, sev ant/inf/pos HK, in mild to mod MR  . Complication of anesthesia    more recently patient oxygen levels do not rebound  as quickly  . Compressed spine fracture ([place]) 08/06/2016   lumbar 2, t11, t12  . COPD (chronic obstructive pulmonary disease) ([place])   . Fractured pelvis ([place]) 05/26/2016   2 places  . GERD (gastroesophageal reflux disease)   . History of blood clots    12/2001  . History of colonoscopy 2013  . History of mammography, screening 2015  . History of Papanicolaou smear of cervix 2013  . HLD (hyperlipidemia)   . HTN (hypertension)   . Hypothyroidism   . Lung cancer ([place])   . Mitral regurgitation    a.  s/p mitral ring placement 09/2000; b. echo 09/2010: EF 50%, inf HK, post AK, mild MR, prosthetic mitral valve ring w/ peak gradient of 10 mmHg; b. echo 2/13: EF 50%, mild MR/TR     . Myocardial infarction ([place])    X 2 (LAST ONE IN 2002)  . Neuropathy   . Pacemaker    a. MDT 2002; b. generator replacement 2013; c. followed by Dr. Omelia Blackwater, MD  . PAF (paroxysmal atrial fibrillation) ([place])    a. on Eliquis   . Personal history of chemotherapy   . Personal history of radiation therapy     PAST SURGICAL HISTORY :   Past Surgical History:  Procedure Laterality Date  . ABLATION  04/2016   [facility]  . APPENDECTOMY    . CARDIAC CATHETERIZATION N/A 04/28/2015   Procedure: Left Heart Cath and Coronary Angiography;  Surgeon: Wellington Hampshire, MD;  Location: [place] [facility];  Service: Cardiovascular;  Laterality: N/A;  . COLONOSCOPY  12/2009   2 small tubular adenomas  . CORONARY ARTERY BYPASS GRAFT  09/2000  . ECTOPIC PREGNANCY SURGERY    . ELECTROMAGNETIC NAVIGATION BROCHOSCOPY N/A 10/06/2015   Procedure: ELECTROMAGNETIC NAVIGATION BRONCHOSCOPY;  Surgeon: Flora Lipps, MD;  Location: [facility];  Service: Cardiopulmonary;  Laterality: N/A;  . ENDOBRONCHIAL ULTRASOUND N/A 10/06/2015   Procedure: ENDOBRONCHIAL ULTRASOUND;  Surgeon: Flora Lipps, MD;  Location: [facility];  Service: Cardiopulmonary;  Laterality: N/A;  . KYPHOPLASTY N/A 09/28/2016   Procedure: KYPHOPLASTY;  Surgeon: Hessie Knows, MD;  Location: [facility];  Service: Orthopedics;  Laterality: N/A;  . PACEMAKER INSERTION  03/2012  . thorocentesis  12/24/2016  . VAGINAL HYSTERECTOMY     partial - left ovary remains    FAMILY HISTORY :   Family History  Problem Relation Age of Onset  . COPD Mother        sister, and brother  . Stroke Maternal Grandmother   . Hypertension Sister   . Hypertension Brother   . Colon cancer Brother        age 32  . Heart attack Neg Hx   . Breast cancer Neg Hx     SOCIAL HISTORY:   Social History  Substance  Use Topics  . Smoking status: Former Smoker    Packs/day: 2.00    Years: 42.00    Types: Cigarettes    Quit date: 08/30/2000  . Smokeless tobacco: Never Used     Comment: quit smoking in 08/28/2000  . Alcohol use 6.0 oz/week    10 Glasses of wine per week     Comment: 2 glasses of wine per day    ALLERGIES:  is allergic to lovenox [enoxaparin sodium] and meperidine.  MEDICATIONS:  Current Outpatient Prescriptions  Medication Sig Dispense Refill  . acetaminophen (TYLENOL) 500 MG tablet Take 1,000 mg by mouth every 6 (six) hours.     Marland Kitchen amiodarone (PACERONE) 200 MG tablet  Take 1 tablet (200 mg total) by mouth as directed. Take 2 tablets by mouth twice daily for 5 days, then 1 tablet by mouth twice daily 180 tablet 3  . apixaban (ELIQUIS) 5 MG TABS tablet Take 5 mg by mouth 2 (two) times daily.     Marland Kitchen atorvastatin (LIPITOR) 20 MG tablet TAKE 1 TABLET (20 MG TOTAL) BY MOUTH AT BEDTIME. 30 tablet 5  . Calcium Carb-Cholecalciferol (CALCIUM 600-D PO) Take 2 tablets by mouth every morning.     . Cetirizine HCl 10 MG CAPS Take 1 capsule (10 mg total) by mouth 1 day or 1 dose. (Patient taking differently: Take 1 capsule by mouth daily. ) 30 capsule 5  . furosemide (LASIX) 20 MG tablet Take 1 tablet (20 mg total) by mouth daily as needed. 30 tablet 3  . levothyroxine (SYNTHROID, LEVOTHROID) 75 MCG tablet TAKE 1 TABLET (75 MCG TOTAL) BY MOUTH DAILY BEFORE BREAKFAST. 30 tablet 5  . metoprolol succinate (TOPROL-XL) 25 MG 24 hr tablet TAKE 1 TABLET BY MOUTH EVERY MORNING AND 2 TABS IN THE EVENING 90 tablet 3  . omeprazole (PRILOSEC) 20 MG capsule TAKE 1 CAPSULE (20 MG TOTAL) BY MOUTH EVERY MORNING. (Patient taking differently: Take 20 mg by mouth every other day. ) 30 capsule 6  . sacubitril-valsartan (ENTRESTO) 24-26 MG Take 1 tablet by mouth 2 (two) times daily. 60 tablet 6  . umeclidinium bromide (INCRUSE ELLIPTA) 62.5 MCG/INH AEPB Inhale 1 puff into the lungs daily. 1 each 0  . senna (SENOKOT) 8.6 MG  TABS tablet Take 1 tablet (8.6 mg total) by mouth daily. (Patient not taking: Reported on 03/15/2017) 30 each 0   No current facility-administered medications for this visit.    Facility-Administered Medications Ordered in Other Visits  Medication Dose Route Frequency Provider Last Rate Last Dose  . sodium chloride 0.9 % injection 10 mL  10 mL Intravenous PRN Forest Gleason, MD   10 mL at 02/19/15 1000  . sodium chloride flush (NS) 0.9 % injection 10 mL  10 mL Intravenous PRN Cammie Sickle, MD   10 mL at 02/16/16 1048    PHYSICAL EXAMINATION: ECOG PERFORMANCE STATUS: 0 - Asymptomatic  BP 99/62 (BP Location: Right Arm)   Pulse 79   Temp 98 F (36.7 C) (Tympanic)   Resp 16   Wt 163 lb 14.6 oz (74.3 kg)   SpO2 92% Comment: from 92% to 95%  BMI 26.46 kg/m   Filed Weights   03/15/17 1124  Weight: 163 lb 14.6 oz (74.3 kg)    GENERAL: Well-nourished well-developed; Alert, no distress and comfortable. She is alone.  EYES: no pallor or icterus OROPHARYNX: POSITIVE for thrush. no ulceration; good dentition  NECK: supple, no masses felt LYMPH:  no palpable lymphadenopathy in the cervical, axillary or inguinal regions LUNGS: Decreased breath sounds to auscultation at bases.  No wheeze or crackles HEART/CVS: regular rate & rhythm and no murmurs; No lower extremity edema ABDOMEN:abdomen soft, non-tender and normal bowel sounds Musculoskeletal:no cyanosis of digits and no clubbing  PSYCH: alert & oriented x 3 with fluent speech NEURO: no focal motor/sensory deficits SKIN:  no rashes or significant lesions  LABORATORY DATA:  I have reviewed the data as listed    Component Value Date/Time   NA 138 03/15/2017 1058   NA 139 11/17/2015 1526   NA 136 12/24/2014 1457   K 4.0 03/15/2017 1058   K 3.6 12/24/2014 1457   CL 98 (L) 03/15/2017 1058   CL  98 (L) 12/24/2014 1457   CO2 32 03/15/2017 1058   CO2 32 12/24/2014 1457   GLUCOSE 136 (H) 03/15/2017 1058   GLUCOSE 107 (H)  12/24/2014 1457   BUN 13 03/15/2017 1058   BUN 30 (H) 11/17/2015 1526   BUN 17 12/24/2014 1457   CREATININE 1.12 (H) 03/15/2017 1058   CREATININE 1.00 12/24/2014 1457   CALCIUM 9.2 03/15/2017 1058   CALCIUM 9.5 12/24/2014 1457   PROT 7.2 08/16/2016 0945   PROT 7.4 12/24/2014 1457   ALBUMIN 3.8 08/16/2016 0945   ALBUMIN 3.9 12/24/2014 1457   AST 18 08/16/2016 0945   AST 22 12/24/2014 1457   ALT 11 (L) 08/16/2016 0945   ALT 19 12/24/2014 1457   ALKPHOS 78 08/16/2016 0945   ALKPHOS 65 12/24/2014 1457   BILITOT 0.4 08/16/2016 0945   BILITOT 0.4 12/24/2014 1457   GFRNONAA 45 (L) 03/15/2017 1058   GFRNONAA 55 (L) 12/24/2014 1457   GFRAA 53 (L) 03/15/2017 1058   GFRAA >60 12/24/2014 1457    No results found for: SPEP, UPEP  Lab Results  Component Value Date   WBC 5.2 03/15/2017   NEUTROABS 3.0 03/15/2017   HGB 13.9 03/15/2017   HCT 41.5 03/15/2017   MCV 88.4 03/15/2017   PLT 232 03/15/2017      Chemistry      Component Value Date/Time   NA 138 03/15/2017 1058   NA 139 11/17/2015 1526   NA 136 12/24/2014 1457   K 4.0 03/15/2017 1058   K 3.6 12/24/2014 1457   CL 98 (L) 03/15/2017 1058   CL 98 (L) 12/24/2014 1457   CO2 32 03/15/2017 1058   CO2 32 12/24/2014 1457   BUN 13 03/15/2017 1058   BUN 30 (H) 11/17/2015 1526   BUN 17 12/24/2014 1457   CREATININE 1.12 (H) 03/15/2017 1058   CREATININE 1.00 12/24/2014 1457      Component Value Date/Time   CALCIUM 9.2 03/15/2017 1058   CALCIUM 9.5 12/24/2014 1457   ALKPHOS 78 08/16/2016 0945   ALKPHOS 65 12/24/2014 1457   AST 18 08/16/2016 0945   AST 22 12/24/2014 1457   ALT 11 (L) 08/16/2016 0945   ALT 19 12/24/2014 1457   BILITOT 0.4 08/16/2016 0945   BILITOT 0.4 12/24/2014 1457       RADIOGRAPHIC STUDIES: I have personally reviewed the radiological images as listed and agreed with the findings in the report. No results found.   ASSESSMENT & PLAN:  Cancer of hilus of right lung ([place]) Stage III squamous cell lung  cancer s/p chemo-RT- 2016. Status post bronchoscopy in February 2017-negative for malignancy; May 5th PET scan shows no significant concerns for recurrent malignancy; shows radiation changes; pleural effusion [moderate-C discussion below].  # Recent Right pleural effusion [may 2018]- status post thoracentesis with symptomatic improvement;likely from CHF.  # Worsening shortness of breath- question worsening pleural effusion.; Question CHF. Recommend a chest x-ray ASAP. If abnormal would recommend thoracentesis/ increasing Lasix.  # Worsening of thoracolumbar pain- history of compression fractures. Reviewed the recent PET scan no evidence of active disease in May 2018. Recommend x-rays ASAP  # Afib s/p PPM [Dr.Gollan]; currently wearing a monitor.   # follow up with me in 2 months/ no labs. We'll call patients with the x-ray results/next plan of care.   CC: Dr. Rockey Situ; Mortimer Fries.    Orders Placed This Encounter  Procedures  . DG Chest 2 View    Standing Status:   Future  Standing Expiration Date:   05/15/2018    Order Specific Question:   Reason for Exam (SYMPTOM  OR DIAGNOSIS REQUIRED)    Answer:   shortness of breath    Order Specific Question:   Preferred imaging location?    Answer:   [facility]  . DG Thoracolumabar Spine    Standing Status:   Future    Standing Expiration Date:   03/15/2018    Order Specific Question:   Reason for Exam (SYMPTOM  OR DIAGNOSIS REQUIRED)    Answer:   back pain    Order Specific Question:   Preferred imaging location?    Answer:   [facility]    Order Specific Question:   Radiology Contrast Protocol - do NOT remove file path    Answer:   [email or web address]   All questions were answered. The patient knows to call the clinic with any problems, questions or concerns.      Cammie Sickle, MD 03/15/2017 11:58 AM

## 2017-03-16 ENCOUNTER — Telehealth: Payer: Self-pay | Admitting: *Deleted

## 2017-03-16 NOTE — Telephone Encounter (Signed)
-----   Message from Cammie Sickle, MD sent at 03/15/2017  8:39 PM EDT ----- Please inform patient that her chest x-ray/back x-rays- showed no acute problem to explain her shortness of breath or back pain. Recommend- follow-up with pulmonary for difficulty breathing. Back pain question musculoskeletal -Recommend Tylenol every 6-8 hours for 3-4 days.

## 2017-03-16 NOTE — Telephone Encounter (Signed)
Spoke with patient. Patient made aware of md recommendations and cxr results

## 2017-03-19 ENCOUNTER — Other Ambulatory Visit: Payer: Self-pay | Admitting: Internal Medicine

## 2017-03-21 ENCOUNTER — Other Ambulatory Visit: Payer: Self-pay | Admitting: Cardiovascular Disease

## 2017-03-22 ENCOUNTER — Ambulatory Visit: Payer: Medicare Other | Admitting: Internal Medicine

## 2017-03-28 ENCOUNTER — Encounter: Payer: Self-pay | Admitting: Cardiovascular Disease

## 2017-03-31 ENCOUNTER — Encounter: Payer: Self-pay | Admitting: Internal Medicine

## 2017-04-01 ENCOUNTER — Other Ambulatory Visit: Payer: Self-pay | Admitting: *Deleted

## 2017-04-01 MED ORDER — PREDNISONE 10 MG PO TABS: 10.0000 mg | ORAL_TABLET | Freq: Every day | ORAL | 0 refills | Status: DC

## 2017-04-06 ENCOUNTER — Encounter: Payer: Self-pay | Admitting: *Deleted

## 2017-04-06 ENCOUNTER — Inpatient Hospital Stay
Admission: EM | Admit: 2017-04-06 | Discharge: 2017-04-08 | DRG: 291 | Disposition: A | Discharge status: Home / Self Care | Payer: Medicare Other | Attending: Internal Medicine | Admitting: Internal Medicine

## 2017-04-06 ENCOUNTER — Emergency Department: Payer: Medicare Other

## 2017-04-06 ENCOUNTER — Inpatient Hospital Stay (HOSPITAL_COMMUNITY)
Admit: 2017-04-06 | Discharge: 2017-04-06 | Disposition: A | Discharge status: Home / Self Care | Payer: Medicare Other | Attending: Physician Assistant | Admitting: Physician Assistant

## 2017-04-06 DIAGNOSIS — R0603 Acute respiratory distress: Secondary | ICD-10-CM | POA: Diagnosis present

## 2017-04-06 DIAGNOSIS — I252 Old myocardial infarction: Secondary | ICD-10-CM | POA: Diagnosis not present

## 2017-04-06 DIAGNOSIS — Z9221 Personal history of antineoplastic chemotherapy: Secondary | ICD-10-CM

## 2017-04-06 DIAGNOSIS — N179 Acute kidney failure, unspecified: Secondary | ICD-10-CM | POA: Diagnosis present

## 2017-04-06 DIAGNOSIS — Z85118 Personal history of other malignant neoplasm of bronchus and lung: Secondary | ICD-10-CM

## 2017-04-06 DIAGNOSIS — Z7952 Long term (current) use of systemic steroids: Secondary | ICD-10-CM

## 2017-04-06 DIAGNOSIS — I340 Nonrheumatic mitral (valve) insufficiency: Secondary | ICD-10-CM | POA: Diagnosis present

## 2017-04-06 DIAGNOSIS — I25810 Atherosclerosis of coronary artery bypass graft(s) without angina pectoris: Secondary | ICD-10-CM | POA: Diagnosis present

## 2017-04-06 DIAGNOSIS — J441 Chronic obstructive pulmonary disease with (acute) exacerbation: Secondary | ICD-10-CM | POA: Diagnosis present

## 2017-04-06 DIAGNOSIS — I5022 Chronic systolic (congestive) heart failure: Secondary | ICD-10-CM | POA: Diagnosis present

## 2017-04-06 DIAGNOSIS — Z9981 Dependence on supplemental oxygen: Secondary | ICD-10-CM

## 2017-04-06 DIAGNOSIS — Z86718 Personal history of other venous thrombosis and embolism: Secondary | ICD-10-CM

## 2017-04-06 DIAGNOSIS — Z951 Presence of aortocoronary bypass graft: Secondary | ICD-10-CM

## 2017-04-06 DIAGNOSIS — I495 Sick sinus syndrome: Secondary | ICD-10-CM

## 2017-04-06 DIAGNOSIS — Z79899 Other long term (current) drug therapy: Secondary | ICD-10-CM

## 2017-04-06 DIAGNOSIS — I484 Atypical atrial flutter: Secondary | ICD-10-CM | POA: Diagnosis present

## 2017-04-06 DIAGNOSIS — Z800 Family history of malignant neoplasm of digestive organs: Secondary | ICD-10-CM

## 2017-04-06 DIAGNOSIS — Z950 Presence of cardiac pacemaker: Secondary | ICD-10-CM | POA: Diagnosis present

## 2017-04-06 DIAGNOSIS — I110 Hypertensive heart disease with heart failure: Principal | ICD-10-CM | POA: Diagnosis present

## 2017-04-06 DIAGNOSIS — Z923 Personal history of irradiation: Secondary | ICD-10-CM

## 2017-04-06 DIAGNOSIS — E872 Acidosis: Secondary | ICD-10-CM | POA: Diagnosis present

## 2017-04-06 DIAGNOSIS — Z7901 Long term (current) use of anticoagulants: Secondary | ICD-10-CM

## 2017-04-06 DIAGNOSIS — Z825 Family history of asthma and other chronic lower respiratory diseases: Secondary | ICD-10-CM

## 2017-04-06 DIAGNOSIS — Z87891 Personal history of nicotine dependence: Secondary | ICD-10-CM

## 2017-04-06 DIAGNOSIS — J810 Acute pulmonary edema: Secondary | ICD-10-CM

## 2017-04-06 DIAGNOSIS — J9622 Acute and chronic respiratory failure with hypercapnia: Secondary | ICD-10-CM | POA: Diagnosis present

## 2017-04-06 DIAGNOSIS — T380X5A Adverse effect of glucocorticoids and synthetic analogues, initial encounter: Secondary | ICD-10-CM | POA: Diagnosis present

## 2017-04-06 DIAGNOSIS — J9621 Acute and chronic respiratory failure with hypoxia: Secondary | ICD-10-CM | POA: Diagnosis present

## 2017-04-06 DIAGNOSIS — I480 Paroxysmal atrial fibrillation: Secondary | ICD-10-CM | POA: Diagnosis present

## 2017-04-06 DIAGNOSIS — Z952 Presence of prosthetic heart valve: Secondary | ICD-10-CM | POA: Diagnosis not present

## 2017-04-06 DIAGNOSIS — E876 Hypokalemia: Secondary | ICD-10-CM | POA: Diagnosis present

## 2017-04-06 DIAGNOSIS — Z8249 Family history of ischemic heart disease and other diseases of the circulatory system: Secondary | ICD-10-CM

## 2017-04-06 DIAGNOSIS — E785 Hyperlipidemia, unspecified: Secondary | ICD-10-CM | POA: Diagnosis present

## 2017-04-06 DIAGNOSIS — I4892 Unspecified atrial flutter: Secondary | ICD-10-CM | POA: Diagnosis present

## 2017-04-06 DIAGNOSIS — I429 Cardiomyopathy, unspecified: Secondary | ICD-10-CM | POA: Diagnosis present

## 2017-04-06 DIAGNOSIS — C3490 Malignant neoplasm of unspecified part of unspecified bronchus or lung: Secondary | ICD-10-CM | POA: Diagnosis present

## 2017-04-06 DIAGNOSIS — E039 Hypothyroidism, unspecified: Secondary | ICD-10-CM | POA: Diagnosis present

## 2017-04-06 DIAGNOSIS — I5023 Acute on chronic systolic (congestive) heart failure: Secondary | ICD-10-CM

## 2017-04-06 DIAGNOSIS — I959 Hypotension, unspecified: Secondary | ICD-10-CM | POA: Diagnosis present

## 2017-04-06 DIAGNOSIS — E860 Dehydration: Secondary | ICD-10-CM | POA: Diagnosis present

## 2017-04-06 DIAGNOSIS — I361 Nonrheumatic tricuspid (valve) insufficiency: Secondary | ICD-10-CM | POA: Diagnosis not present

## 2017-04-06 DIAGNOSIS — R739 Hyperglycemia, unspecified: Secondary | ICD-10-CM | POA: Diagnosis present

## 2017-04-06 DIAGNOSIS — I2510 Atherosclerotic heart disease of native coronary artery without angina pectoris: Secondary | ICD-10-CM | POA: Diagnosis present

## 2017-04-06 DIAGNOSIS — J449 Chronic obstructive pulmonary disease, unspecified: Secondary | ICD-10-CM | POA: Diagnosis present

## 2017-04-06 DIAGNOSIS — K219 Gastro-esophageal reflux disease without esophagitis: Secondary | ICD-10-CM | POA: Diagnosis present

## 2017-04-06 DIAGNOSIS — Z9861 Coronary angioplasty status: Secondary | ICD-10-CM

## 2017-04-06 DIAGNOSIS — I4430 Unspecified atrioventricular block: Secondary | ICD-10-CM | POA: Diagnosis present

## 2017-04-06 LAB — BASIC METABOLIC PANEL
ANION GAP: 12 (ref 5–15)
BUN: 22 mg/dL — AB (ref 6–20)
CHLORIDE: 97 mmol/L — AB (ref 101–111)
CO2: 30 mmol/L (ref 22–32)
Calcium: 9.3 mg/dL (ref 8.9–10.3)
Creatinine, Ser: 1.3 mg/dL — ABNORMAL HIGH (ref 0.44–1.00)
GFR calc Af Amer: 44 mL/min — ABNORMAL LOW (ref >60)
GFR calc non Af Amer: 38 mL/min — ABNORMAL LOW (ref >60)
Glucose, Bld: 178 mg/dL — ABNORMAL HIGH (ref 65–99)
POTASSIUM: 3.6 mmol/L (ref 3.5–5.1)
SODIUM: 139 mmol/L (ref 135–145)

## 2017-04-06 LAB — BRAIN NATRIURETIC PEPTIDE: B NATRIURETIC PEPTIDE 5: 1022 pg/mL — AB (ref 0.0–100.0)

## 2017-04-06 LAB — GLUCOSE, CAPILLARY: Glucose-Capillary: 172 mg/dL — ABNORMAL HIGH (ref 65–99)

## 2017-04-06 LAB — CBC WITH DIFFERENTIAL/PLATELET
Basophils Absolute: 0 10*3/uL (ref 0–0.1)
Basophils Relative: 0 %
Eosinophils Absolute: 0.2 10*3/uL (ref 0–0.7)
Eosinophils Relative: 2 %
HCT: 41.8 % (ref 35.0–47.0)
HEMOGLOBIN: 13.6 g/dL (ref 12.0–16.0)
LYMPHS ABS: 0.9 10*3/uL — AB (ref 1.0–3.6)
LYMPHS PCT: 9 %
MCH: 29.9 pg (ref 26.0–34.0)
MCHC: 32.6 g/dL (ref 32.0–36.0)
MCV: 91.8 fL (ref 80.0–100.0)
Monocytes Absolute: 0.8 10*3/uL (ref 0.2–0.9)
Monocytes Relative: 9 %
NEUTROS PCT: 80 %
Neutro Abs: 7.7 10*3/uL — ABNORMAL HIGH (ref 1.4–6.5)
Platelets: 255 10*3/uL (ref 150–440)
RBC: 4.55 MIL/uL (ref 3.80–5.20)
RDW: 18 % — ABNORMAL HIGH (ref 11.5–14.5)
WBC: 9.7 10*3/uL (ref 3.6–11.0)

## 2017-04-06 LAB — LACTIC ACID, PLASMA
LACTIC ACID, VENOUS: 2.2 mmol/L — AB (ref 0.5–1.9)
LACTIC ACID, VENOUS: 1.4 mmol/L (ref 0.5–1.9)

## 2017-04-06 LAB — TROPONIN I: Troponin I: <0.03 ng/mL (ref <0.03)

## 2017-04-06 LAB — MAGNESIUM: Magnesium: 1.8 mg/dL (ref 1.7–2.4)

## 2017-04-06 LAB — MRSA PCR SCREENING: MRSA BY PCR: NEGATIVE

## 2017-04-06 MED ORDER — ACETAMINOPHEN 325 MG PO TABS: 650.0000 mg | ORAL_TABLET | Freq: Four times a day (QID) | ORAL | Status: DC | PRN

## 2017-04-06 MED ORDER — SODIUM CHLORIDE 0.9% FLUSH
3.0000 mL | Freq: Two times a day (BID) | INTRAVENOUS | Status: DC
  Administered 2017-04-06 – 2017-04-08 (×3): 3 mL via INTRAVENOUS

## 2017-04-06 MED ORDER — FUROSEMIDE 10 MG/ML IJ SOLN
40.0000 mg | Freq: Once | INTRAMUSCULAR | Status: AC
  Administered 2017-04-06: 40 mg via INTRAVENOUS
  Filled 2017-04-06: qty 4

## 2017-04-06 MED ORDER — METHYLPREDNISOLONE SODIUM SUCC 40 MG IJ SOLR
40.0000 mg | Freq: Two times a day (BID) | INTRAMUSCULAR | Status: DC
  Administered 2017-04-06 – 2017-04-08 (×4): 40 mg via INTRAVENOUS
  Filled 2017-04-06 (×4): qty 1

## 2017-04-06 MED ORDER — ACETAMINOPHEN 650 MG RE SUPP
650.0000 mg | Freq: Four times a day (QID) | RECTAL | Status: DC | PRN
Start: 2017-04-06 — End: 2017-04-08

## 2017-04-06 MED ORDER — SENNOSIDES-DOCUSATE SODIUM 8.6-50 MG PO TABS: 1.0000 | ORAL_TABLET | Freq: Every evening | ORAL | Status: DC | PRN

## 2017-04-06 MED ORDER — IPRATROPIUM-ALBUTEROL 0.5-2.5 (3) MG/3ML IN SOLN
3.0000 mL | Freq: Once | RESPIRATORY_TRACT | Status: AC
  Administered 2017-04-06: 3 mL via RESPIRATORY_TRACT
  Filled 2017-04-06: qty 6

## 2017-04-06 MED ORDER — LORATADINE 10 MG PO TABS
10.0000 mg | ORAL_TABLET | Freq: Every day | ORAL | Status: DC
  Administered 2017-04-07 – 2017-04-08 (×2): 10 mg via ORAL
  Filled 2017-04-06 (×2): qty 1

## 2017-04-06 MED ORDER — SACUBITRIL-VALSARTAN 24-26 MG PO TABS
1.0000 | ORAL_TABLET | Freq: Two times a day (BID) | ORAL | Status: DC
  Administered 2017-04-06 – 2017-04-07 (×3): 1 via ORAL
  Filled 2017-04-06 (×4): qty 1

## 2017-04-06 MED ORDER — ATORVASTATIN CALCIUM 20 MG PO TABS
20.0000 mg | ORAL_TABLET | Freq: Every day | ORAL | Status: DC
  Administered 2017-04-06 – 2017-04-07 (×2): 20 mg via ORAL
  Filled 2017-04-06 (×2): qty 1

## 2017-04-06 MED ORDER — ONDANSETRON HCL 4 MG PO TABS: 4.0000 mg | ORAL_TABLET | Freq: Four times a day (QID) | ORAL | Status: DC | PRN

## 2017-04-06 MED ORDER — SODIUM CHLORIDE 0.9 % IV SOLN: 250.0000 mL | INTRAVENOUS | Status: DC | PRN

## 2017-04-06 MED ORDER — ONDANSETRON HCL 4 MG/2ML IJ SOLN: 4.0000 mg | Freq: Four times a day (QID) | INTRAMUSCULAR | Status: DC | PRN

## 2017-04-06 MED ORDER — METHYLPREDNISOLONE SODIUM SUCC 125 MG IJ SOLR: 60.0000 mg | Freq: Two times a day (BID) | INTRAMUSCULAR | Status: DC

## 2017-04-06 MED ORDER — ALBUTEROL SULFATE (2.5 MG/3ML) 0.083% IN NEBU
2.5000 mg | INHALATION_SOLUTION | Freq: Four times a day (QID) | RESPIRATORY_TRACT | Status: DC
  Administered 2017-04-06 – 2017-04-08 (×6): 2.5 mg via RESPIRATORY_TRACT
  Filled 2017-04-06 (×7): qty 3

## 2017-04-06 MED ORDER — LEVOTHYROXINE SODIUM 50 MCG PO TABS
75.0000 ug | ORAL_TABLET | Freq: Every day | ORAL | Status: DC
  Administered 2017-04-07: 09:00:00 75 ug via ORAL
  Filled 2017-04-06: qty 1

## 2017-04-06 MED ORDER — SODIUM CHLORIDE 0.9% FLUSH: 3.0000 mL | INTRAVENOUS | Status: DC | PRN

## 2017-04-06 MED ORDER — ALBUTEROL SULFATE (2.5 MG/3ML) 0.083% IN NEBU
2.5000 mg | INHALATION_SOLUTION | RESPIRATORY_TRACT | Status: DC | PRN
  Administered 2017-04-06 – 2017-04-08 (×3): 2.5 mg via RESPIRATORY_TRACT
  Filled 2017-04-06 (×2): qty 3

## 2017-04-06 MED ORDER — PANTOPRAZOLE SODIUM 40 MG PO TBEC
40.0000 mg | DELAYED_RELEASE_TABLET | Freq: Every day | ORAL | Status: DC
  Administered 2017-04-07 – 2017-04-08 (×2): 40 mg via ORAL
  Filled 2017-04-06 (×2): qty 1

## 2017-04-06 MED ORDER — UMECLIDINIUM BROMIDE 62.5 MCG/INH IN AEPB
1.0000 | INHALATION_SPRAY | Freq: Every day | RESPIRATORY_TRACT | Status: DC
  Administered 2017-04-08: 1 via RESPIRATORY_TRACT
  Filled 2017-04-06: qty 7

## 2017-04-06 MED ORDER — APIXABAN 5 MG PO TABS
5.0000 mg | ORAL_TABLET | Freq: Two times a day (BID) | ORAL | Status: DC
Start: 2017-04-06 — End: 2017-04-08
  Administered 2017-04-06 – 2017-04-08 (×4): 5 mg via ORAL
  Filled 2017-04-06 (×4): qty 1

## 2017-04-06 MED ORDER — SENNA 8.6 MG PO TABS
1.0000 | ORAL_TABLET | Freq: Every day | ORAL | Status: DC | PRN
  Administered 2017-04-08: 8.6 mg via ORAL
  Filled 2017-04-06: qty 1

## 2017-04-06 MED ORDER — AMIODARONE HCL 200 MG PO TABS
200.0000 mg | ORAL_TABLET | Freq: Two times a day (BID) | ORAL | Status: DC
  Administered 2017-04-06 – 2017-04-08 (×4): 200 mg via ORAL
  Filled 2017-04-06 (×4): qty 1

## 2017-04-06 MED ORDER — BUDESONIDE 0.25 MG/2ML IN SUSP: 0.2500 mg | Freq: Two times a day (BID) | RESPIRATORY_TRACT | Status: DC

## 2017-04-06 MED ORDER — BISACODYL 5 MG PO TBEC: 5.0000 mg | DELAYED_RELEASE_TABLET | Freq: Every day | ORAL | Status: DC | PRN

## 2017-04-06 MED ORDER — METOPROLOL SUCCINATE ER 50 MG PO TB24
50.0000 mg | ORAL_TABLET | Freq: Every day | ORAL | Status: DC
  Administered 2017-04-07: 50 mg via ORAL
  Filled 2017-04-06: qty 1

## 2017-04-06 MED ORDER — ORAL CARE MOUTH RINSE
15.0000 mL | Freq: Two times a day (BID) | OROMUCOSAL | Status: DC
  Administered 2017-04-07: 15 mL via OROMUCOSAL

## 2017-04-06 MED ORDER — FUROSEMIDE 10 MG/ML IJ SOLN
40.0000 mg | Freq: Two times a day (BID) | INTRAMUSCULAR | Status: DC
  Administered 2017-04-06 – 2017-04-08 (×4): 40 mg via INTRAVENOUS
  Filled 2017-04-06 (×4): qty 4

## 2017-04-06 NOTE — Consult Note (Signed)
Name: Jenny Cooper MRN: [id number] DOB: [birth year]    ADMISSION DATE:  04/06/2017 CONSULTATION DATE: 04/06/2017  REFERRING MD : Dr. Bridgett Larsson   CHIEF COMPLAINT: Shortness of Breath   BRIEF PATIENT DESCRIPTION:  81 year old female admitted 08/8 with acute hypoxic hypercapnic respiratory failure secondary to CHF exacerbation and AECOPD requiring continuous BiPAP.  SIGNIFICANT EVENTS  08/8-Pt admitted to the Stepdown Unit   STUDIES:  None   HISTORY OF PRESENT ILLNESS:   This is a 81 year old female with a past medical history of chronic atrial fibrillation, sick sinus syndrome, pacemaker, cardiomyopathy with an EF of 45%, MI, neuropathy, mitral regurgitation, hypothyroidism, hypertension, hyperlipidemia, GERD, COPD, compressed spinal fracture, chronic systolic CHF, right lung carcinoma (dx 2016 followed by Dr. Verdene Rio in remission s/p chemo and radiation), blood clots, CAD, atrial flutter status post ablation in 2014.  She [facility] ER 08/8 via EMS with complaints of shortness of breath onset of symptoms 2 days prior to presentation. Upon EMS arrival patient was placed on CPAP, given 2 duoneb treatments, and IV Solu-Medrol 125 mg.  In the ER due to continued shortness of breath patient placed on continuous BiPAP cxr revealing pulmonary edema, therefore she was given 40 mg IV Lasix. She was subsequently admitted to the stepdown unit by hospitalist team for further workup and treatment PCCM consulted.  PAST MEDICAL HISTORY :   has a past medical history of Atypical atrial flutter ([place]); CAD (coronary artery disease); Carcinoma of right lung ([place]) (01/03/2015); Chronic systolic CHF (congestive heart failure) ([place]); Complication of anesthesia; Compressed spine fracture ([place]) (08/06/2016); COPD (chronic obstructive pulmonary disease) ([place]); Fractured pelvis ([place]) (05/26/2016); GERD (gastroesophageal reflux disease); History of blood clots; History of colonoscopy (2013); History of mammography,  screening (2015); History of Papanicolaou smear of cervix (2013); HLD (hyperlipidemia); HTN (hypertension); Hypothyroidism; Lung cancer ([place]); Mitral regurgitation; Myocardial infarction [facility]); Neuropathy; Pacemaker; PAF (paroxysmal atrial fibrillation) ([place]); Personal history of chemotherapy; and Personal history of radiation therapy.  has a past surgical history that includes Appendectomy; Vaginal hysterectomy; Coronary artery bypass graft (09/2000); Pacemaker insertion (03/2012); Cardiac catheterization (N/A, 04/28/2015); Endobronchial ultrasound (N/A, 10/06/2015); Electormagnetic navigation bronchoscopy (N/A, 10/06/2015); Colonoscopy (12/2009); Ablation (04/2016); Ectopic pregnancy surgery; Kyphoplasty (N/A, 09/28/2016); and thorocentesis (12/24/2016). Prior to Admission medications   Medication Sig Start Date End Date Taking? Authorizing Provider  amiodarone (PACERONE) 200 MG tablet Take 1 tablet (200 mg total) by mouth as directed. Take 2 tablets by mouth twice daily for 5 days, then 1 tablet by mouth twice daily 03/07/17  Yes Gollan, Kathlene November, MD  apixaban (ELIQUIS) 5 MG TABS tablet Take 5 mg by mouth 2 (two) times daily.  08/04/16  Yes [provider]  atorvastatin (LIPITOR) 20 MG tablet TAKE 1 TABLET (20 MG TOTAL) BY MOUTH AT BEDTIME. 10/26/16  Yes Glean Hess, MD  Calcium Carb-Cholecalciferol (CALCIUM 600-D PO) Take 2 tablets by mouth every morning.    Yes [provider]  Cetirizine HCl 10 MG CAPS Take 1 capsule (10 mg total) by mouth 1 day or 1 dose. Patient taking differently: Take 1 capsule by mouth daily.  05/19/15  Yes Dunn, Areta Haber, PA-C  furosemide (LASIX) 20 MG tablet TAKE 1 TABLET (20 MG TOTAL) BY MOUTH DAILY AS NEEDED. 03/21/17  Yes Gollan, Kathlene November, MD  levothyroxine (SYNTHROID, LEVOTHROID) 75 MCG tablet TAKE 1 TABLET (75 MCG TOTAL) BY MOUTH DAILY BEFORE BREAKFAST. 11/22/16  Yes Glean Hess, MD  metoprolol succinate (TOPROL-XL) 25 MG 24 hr tablet TAKE 1 TABLET BY  MOUTH EVERY MORNING AND 2 TABS IN THE EVENING 03/21/17  Yes Gollan, Kathlene November, MD  omeprazole (PRILOSEC) 20 MG capsule TAKE 1 CAPSULE (20 MG TOTAL) BY MOUTH EVERY MORNING. 03/21/17  Yes Cammie Sickle, MD  predniSONE (DELTASONE) 10 MG tablet Take 1 tablet (10 mg total) by mouth daily with breakfast. 04/01/17  Yes Kasa, [place], MD  sacubitril-valsartan (ENTRESTO) 24-26 MG Take 1 tablet by mouth 2 (two) times daily. 01/10/17  Yes Minna Merritts, MD  senna (SENOKOT) 8.6 MG TABS tablet Take 1 tablet (8.6 mg total) by mouth daily. 05/27/16  Yes Veronese, [place], MD  umeclidinium bromide (INCRUSE ELLIPTA) 62.5 MCG/INH AEPB Inhale 1 puff into the lungs daily. 02/09/17  Yes Flora Lipps, MD  acetaminophen (TYLENOL) 500 MG tablet Take 1,000 mg by mouth every 6 (six) hours.     [provider]   Allergies  Allergen Reactions  . Lovenox [Enoxaparin Sodium] Itching  . Meperidine Other (See Comments)    Other Reaction: pt does not like how it makes her feel    FAMILY HISTORY:  family history includes COPD in her mother; Colon cancer in her brother; Hypertension in her brother and sister; Stroke in her maternal grandmother. SOCIAL HISTORY:  reports that she quit smoking about 16 years ago. Her smoking use included Cigarettes. She has a 84.00 pack-year smoking history. She has never used smokeless tobacco. She reports that she drinks about 6.0 oz of alcohol per week . She reports that she does not use drugs.  REVIEW OF SYSTEMS: Positives in BOLD   Constitutional: Negative for fever, chills, weight loss, malaise/fatigue and diaphoresis.  HENT: Negative for hearing loss, ear pain, nosebleeds, congestion, sore throat, neck pain, tinnitus and ear discharge.   Eyes: Negative for blurred vision, double vision, photophobia, pain, discharge and redness.  Respiratory: cough, hemoptysis, sputum production, shortness of breath, wheezing and stridor.   Cardiovascular: Negative for chest pain,  palpitations, orthopnea, claudication, leg swelling and PND.  Gastrointestinal: Negative for heartburn, nausea, vomiting, abdominal pain, diarrhea, constipation, blood in stool and melena.  Genitourinary: Negative for dysuria, urgency, frequency, hematuria and flank pain.  Musculoskeletal: Negative for myalgias, back pain, joint pain and falls.  Skin: Negative for itching and rash.  Neurological: Negative for dizziness, tingling, tremors, sensory change, speech change, focal weakness, seizures, loss of consciousness, weakness and headaches.  Endo/Heme/Allergies: Negative for environmental allergies and polydipsia. Does not bruise/bleed easily.  SUBJECTIVE:  Pt states her breathing has improved significantly she is currently on 3L O2 via nasal canula.  VITAL SIGNS: Pulse Rate:  [102-115] 115 (08/08 1200) Resp:  [17-24] 24 (08/08 1200) BP: (102-150)/(77-101) 102/77 (08/08 1200) SpO2:  [99 %-100 %] 100 % (08/08 1200) Weight:  [72.6 kg (160 lb)] 72.6 kg (160 lb) (08/08 1057)  PHYSICAL EXAMINATION: General: Well-developed, well-nourished Caucasian female, NAD Neuro: Alert and oriented, follows commands HEENT: Supple, no JVD Cardiovascular: Irregular irregular, no M/R/G Lungs: Diminished throughout, even, nonlabored; no wheezes, rales, rhonchi, or crackles Abdomen: Positive bowel sounds 4, soft, nontender, nondistended Musculoskeletal: Normal bulk and tone, no edema Skin: Intact no rashes or lesions   Recent Labs Lab 04/06/17 1057  NA 139  K 3.6  CL 97*  CO2 30  BUN 22*  CREATININE 1.30*  GLUCOSE 178*    Recent Labs Lab 04/06/17 1057  HGB 13.6  HCT 41.8  WBC 9.7  PLT 255   Dg Chest Port 1 View  Result Date: 04/06/2017 CLINICAL DATA:  Difficulty breathing since yesterday, worse today.  EXAM: PORTABLE CHEST 1 VIEW COMPARISON:  PA and lateral chest 03/15/2017. FINDINGS: There are new bilateral interstitial opacities. Small right effusion is unchanged. No pneumothorax. Heart  size is normal. Pacing device and Port-A-Cath are again seen. The patient is status post mitral valve repair. IMPRESSION: New bilateral interstitial opacities likely due to pulmonary edema. No change in a small right pleural effusion. Electronically Signed   By: Inge Rise M.D.   On: 04/06/2017 11:17    ASSESSMENT / PLAN: Acute on chronic hypoxic hypercapnic respiratory failure secondary to pulmonary edema and AECOPD Acute renal failure Mild lactic acidosis Hx: Carcinoma of right lung, Chronic systolic CHF, Pacemaker, Atrial fibrillation/Aflutter (chronic eliquis) P: Supplemental O2 to maintain O2 sats 88% to 92% Continue bronchodilator therapy  IV steroids wean as tolerated Repeat chest x-ray in the a.m. IV Lasix 40 mg every 12 hours Echo pending Cardiology consulted appreciate input Trend BMP Replace electrolytes as indicated Monitor urinary output Daily weights Trend lactic acid Trend WBC and monitor fever curve Continue outpatient eliquis, amiodarone, atorvastatin, and metoprolol Monitor for signs and symptoms of bleeding  -Patient stable for transfer to telemetry unit 04/06/17  Marda Stalker, [place] Pager [phone] (please enter 7 digits) PCCM Consult Pager [phone] (please enter 7 digits)

## 2017-04-06 NOTE — ED Triage Notes (Signed)
Pt here from home via [facility] with c/o difficulty breathing since yesterday, reports worse today. Pt arrived on CPAP, was given 2 duonebs and 125mg  of solumedrol en route. Pt with hx of lung cancer (last treatment last year).

## 2017-04-06 NOTE — ED Notes (Signed)
Informed RN bed ready  1410

## 2017-04-06 NOTE — Progress Notes (Signed)
Patient moved to room 254 by bed on tele monitor and o2 with Clyde Canterbury, RN and patient's daughter at side with no distress noted.

## 2017-04-06 NOTE — ED Notes (Signed)
Pt using female urinary external catheter.

## 2017-04-06 NOTE — H&P (Addendum)
[facility] NAME: Jenny Cooper    MR#:  [id number]  DATE OF BIRTH:  1935/10/23  DATE OF ADMISSION:  04/06/2017  PRIMARY CARE PHYSICIAN: Glean Hess, MD   REQUESTING/REFERRING PHYSICIAN: Earleen Newport, MD  CHIEF COMPLAINT:   Chief Complaint  Patient presents with  . Shortness of Breath   Worsening shortness breath for the past 2 days. HISTORY OF PRESENT ILLNESS:  Jenny Cooper  is a 81 y.o. female with a known history of Chronic systolic CHF, COPD, chronic respiratory failure on home oxygen 2 L, CAD, A. fib, lung cancer and hypertension. The patient presently ED with the above chief complaints. She has had worsening shortness of breath, wheezing and a cough for the past 2 days. These symptoms got even worse this morning. She denies any fever or chills, no orthopnea, nocturnal dyspnea or leg edema. But she gained weight 10 pounds in the past 6 weeks. She is hypoxia and put on BiPAP in the ED. Chest x-ray show pulmonary edema. She is treated with IV Solu-Medrol and Lasix in the ED.  PAST MEDICAL HISTORY:   Past Medical History:  Diagnosis Date  . Atypical atrial flutter ([place])    a. s/p ablation 07/27/2013 followed by Dr. Rockey Situ  . CAD (coronary artery disease)    a. s/p MI x 2 in 2002 s/p PCI x 2 in 2002; b. s/p 2v CABG 2002; c. stress echo 07/2004 w/ evi of pos & inf infarct & no evi of ischemia; d. 4/08 dipyridamole scan w/ multiple areas of infarct, no ischemia, EF 49%; e. cath 04/28/15 3v CAD, med Rx rec, no targets for revasc, LM lum irregs, pLAD 30%, 100%, ost-pLCx 60%, [name] 99%, OM2 100%, p-mRCA 90%, [name] 100% L-R collats, VG-mLAD irregs, VG-OM2 oc  . Carcinoma of right lung ([place]) 01/03/2015   a. followed by Dr. Oliva Bustard  . Chronic systolic CHF (congestive heart failure) ([place])    a. echo 03/2015: EF 30-35%, sev ant/inf/pos HK, in mild to mod MR  . Complication of anesthesia    more recently patient oxygen levels  do not rebound as quickly  . Compressed spine fracture ([place]) 08/06/2016   lumbar 2, t11, t12  . COPD (chronic obstructive pulmonary disease) ([place])   . Fractured pelvis ([place]) 05/26/2016   2 places  . GERD (gastroesophageal reflux disease)   . History of blood clots    12/2001  . History of colonoscopy 2013  . History of mammography, screening 2015  . History of Papanicolaou smear of cervix 2013  . HLD (hyperlipidemia)   . HTN (hypertension)   . Hypothyroidism   . Lung cancer ([place])   . Mitral regurgitation    a. s/p mitral ring placement 09/2000; b. echo 09/2010: EF 50%, inf HK, post AK, mild MR, prosthetic mitral valve ring w/ peak gradient of 10 mmHg; b. echo 2/13: EF 50%, mild MR/TR     . Myocardial infarction ([place])    X 2 (LAST ONE IN 2002)  . Neuropathy   . Pacemaker    a. MDT 2002; b. generator replacement 2013; c. followed by Dr. Omelia Blackwater, MD  . PAF (paroxysmal atrial fibrillation) ([place])    a. on Eliquis   . Personal history of chemotherapy   . Personal history of radiation therapy     PAST SURGICAL HISTORY:   Past Surgical History:  Procedure Laterality Date  . ABLATION  04/2016   [facility]  . APPENDECTOMY    .  CARDIAC CATHETERIZATION N/A 04/28/2015   Procedure: Left Heart Cath and Coronary Angiography;  Surgeon: Wellington Hampshire, MD;  Location: [facility];  Service: Cardiovascular;  Laterality: N/A;  . COLONOSCOPY  12/2009   2 small tubular adenomas  . CORONARY ARTERY BYPASS GRAFT  09/2000  . ECTOPIC PREGNANCY SURGERY    . ELECTROMAGNETIC NAVIGATION BROCHOSCOPY N/A 10/06/2015   Procedure: ELECTROMAGNETIC NAVIGATION BRONCHOSCOPY;  Surgeon: Flora Lipps, MD;  Location: [facility];  Service: Cardiopulmonary;  Laterality: N/A;  . ENDOBRONCHIAL ULTRASOUND N/A 10/06/2015   Procedure: ENDOBRONCHIAL ULTRASOUND;  Surgeon: Flora Lipps, MD;  Location: [facility];  Service: Cardiopulmonary;  Laterality: N/A;  . KYPHOPLASTY N/A 09/28/2016   Procedure: KYPHOPLASTY;  Surgeon: [place]ie Knows,  MD;  Location: [facility];  Service: Orthopedics;  Laterality: N/A;  . PACEMAKER INSERTION  03/2012  . thorocentesis  12/24/2016  . VAGINAL HYSTERECTOMY     partial - left ovary remains    SOCIAL HISTORY:   Social History  Substance Use Topics  . Smoking status: Former Smoker    Packs/day: 2.00    Years: 42.00    Types: Cigarettes    Quit date: 08/30/2000  . Smokeless tobacco: Never Used     Comment: quit smoking in 08/28/2000  . Alcohol use 6.0 oz/week    10 Glasses of wine per week     Comment: 2 glasses of wine per day    FAMILY HISTORY:   Family History  Problem Relation Age of Onset  . COPD Mother        sister, and brother  . Stroke Maternal Grandmother   . Hypertension Sister   . Hypertension Brother   . Colon cancer Brother        age 16  . Heart attack Neg Hx   . Breast cancer Neg Hx     DRUG ALLERGIES:   Allergies  Allergen Reactions  . Lovenox [Enoxaparin Sodium] Itching  . Meperidine Other (See Comments)    Other Reaction: pt does not like how it makes her feel    REVIEW OF SYSTEMS:   Review of Systems  Constitutional: Positive for malaise/fatigue. Negative for chills and fever.  HENT: Negative for sore throat.   Eyes: Negative for blurred vision and double vision.  Respiratory: Positive for cough, sputum production, shortness of breath and wheezing. Negative for hemoptysis and stridor.   Cardiovascular: Negative for chest pain, palpitations, orthopnea and leg swelling.  Gastrointestinal: Negative for abdominal pain, blood in stool, diarrhea, melena, nausea and vomiting.  Genitourinary: Negative for dysuria, flank pain and hematuria.  Musculoskeletal: Negative for back pain and joint pain.  Skin: Negative for rash.  Neurological: Positive for weakness. Negative for dizziness, sensory change, focal weakness, seizures, loss of consciousness and headaches.  Endo/Heme/Allergies: Negative for polydipsia.  Psychiatric/Behavioral: Negative for depression.  The patient is not nervous/anxious.     MEDICATIONS AT HOME:   Prior to Admission medications   Medication Sig Start Date End Date Taking? Authorizing Provider  amiodarone (PACERONE) 200 MG tablet Take 1 tablet (200 mg total) by mouth as directed. Take 2 tablets by mouth twice daily for 5 days, then 1 tablet by mouth twice daily 03/07/17  Yes Gollan, Kathlene November, MD  apixaban (ELIQUIS) 5 MG TABS tablet Take 5 mg by mouth 2 (two) times daily.  08/04/16  Yes [provider]  atorvastatin (LIPITOR) 20 MG tablet TAKE 1 TABLET (20 MG TOTAL) BY MOUTH AT BEDTIME. 10/26/16  Yes Glean Hess, MD  Calcium  Carb-Cholecalciferol (CALCIUM 600-D PO) Take 2 tablets by mouth every morning.    Yes [provider]  Cetirizine HCl 10 MG CAPS Take 1 capsule (10 mg total) by mouth 1 day or 1 dose. Patient taking differently: Take 1 capsule by mouth daily.  05/19/15  Yes Dunn, Areta Haber, PA-C  furosemide (LASIX) 20 MG tablet TAKE 1 TABLET (20 MG TOTAL) BY MOUTH DAILY AS NEEDED. 03/21/17  Yes Gollan, Kathlene November, MD  levothyroxine (SYNTHROID, LEVOTHROID) 75 MCG tablet TAKE 1 TABLET (75 MCG TOTAL) BY MOUTH DAILY BEFORE BREAKFAST. 11/22/16  Yes Glean Hess, MD  metoprolol succinate (TOPROL-XL) 25 MG 24 hr tablet TAKE 1 TABLET BY MOUTH EVERY MORNING AND 2 TABS IN THE EVENING 03/21/17  Yes Gollan, Kathlene November, MD  omeprazole (PRILOSEC) 20 MG capsule TAKE 1 CAPSULE (20 MG TOTAL) BY MOUTH EVERY MORNING. 03/21/17  Yes Cammie Sickle, MD  predniSONE (DELTASONE) 10 MG tablet Take 1 tablet (10 mg total) by mouth daily with breakfast. 04/01/17  Yes Kasa, Maretta Bees, MD  sacubitril-valsartan (ENTRESTO) 24-26 MG Take 1 tablet by mouth 2 (two) times daily. 01/10/17  Yes Minna Merritts, MD  senna (SENOKOT) 8.6 MG TABS tablet Take 1 tablet (8.6 mg total) by mouth daily. 05/27/16  Yes Veronese, [place], MD  umeclidinium bromide (INCRUSE ELLIPTA) 62.5 MCG/INH AEPB Inhale 1 puff into the lungs daily. 02/09/17  Yes Flora Lipps, MD  acetaminophen (TYLENOL) 500 MG tablet Take 1,000 mg by mouth every 6 (six) hours.     [provider]      VITAL SIGNS:  Blood pressure 102/77, pulse (!) 115, resp. rate (!) 24, height 5\' 6"  (1.676 m), weight 160 lb (72.6 kg), SpO2 100 %.  PHYSICAL EXAMINATION:  Physical Exam  GENERAL:  81 y.o.-year-old patient lying in the bed with BIPAP. EYES: Pupils equal, round, reactive to light and accommodation. No scleral icterus. Extraocular muscles intact.  HEENT: Head atraumatic, normocephalic. Oropharynx and nasopharynx clear.  NECK:  Supple, no jugular venous distention. No thyroid enlargement, no tenderness.  LUNGS: Normal breath sounds bilaterally, no wheezing, but has rales, no rhonchi or crepitation. No use of accessory muscles of respiration.  CARDIOVASCULAR: S1, S2 normal. No murmurs, rubs, or gallops.  ABDOMEN: Soft, nontender, nondistended. Bowel sounds present. No organomegaly or mass.  EXTREMITIES: No pedal edema, cyanosis, or clubbing.  NEUROLOGIC: Cranial nerves II through XII are intact. Muscle strength 4/5 in all extremities. Sensation intact. Gait not checked.  PSYCHIATRIC: The patient is alert and oriented x 3.  SKIN: No obvious rash, lesion, or ulcer.   LABORATORY PANEL:   CBC  Recent Labs Lab 04/06/17 1057  WBC 9.7  HGB 13.6  HCT 41.8  PLT 255   ------------------------------------------------------------------------------------------------------------------  Chemistries   Recent Labs Lab 04/06/17 1057  NA 139  K 3.6  CL 97*  CO2 30  GLUCOSE 178*  BUN 22*  CREATININE 1.30*  CALCIUM 9.3   ------------------------------------------------------------------------------------------------------------------  Cardiac Enzymes  Recent Labs Lab 04/06/17 1057  TROPONINI <0.03   ------------------------------------------------------------------------------------------------------------------  RADIOLOGY:  Dg Chest Port 1  View  Result Date: 04/06/2017 CLINICAL DATA:  Difficulty breathing since yesterday, worse today. EXAM: PORTABLE CHEST 1 VIEW COMPARISON:  PA and lateral chest 03/15/2017. FINDINGS: There are new bilateral interstitial opacities. Small right effusion is unchanged. No pneumothorax. Heart size is normal. Pacing device and Port-A-Cath are again seen. The patient is status post mitral valve repair. IMPRESSION: New bilateral interstitial opacities likely due to pulmonary edema. No change in  a small right pleural effusion. Electronically Signed   By: Inge Rise M.D.   On: 04/06/2017 11:17      IMPRESSION AND PLAN:   Chronic respiratory failure with hypoxia and hypercapnia due to acute on chronic systolic CHF and COPD exacerbation. The patient will be admitted to stepdown unit. Continue BiPAP, IV Solu-Medrol, albuterol every 6 hours, Robitussin when necessary.  Acute on chronic systolic CHF ejection fraction 30-35%. Start Lasix 40 mg IV every 12 hours, continue Entesto, start CHF protocol, cardiology consult from Dr. Rockey Situ. The patient got  echocardiogram in 1 months in [place].  COPD exacerbation. Start IV Solu-Medrol and albuterol every 6 hours.  History of chronic A. fib, tachycardia due to above. Continue amiodarone, Toprol and Eliquis.  Acute renal failure. Due to dehydration. Follow up BMP while on Lasix. Lactic acidosis. Due to above. Follow-up lactic acid level. Hypertension. Continue lasix, Toprol and Entresto.  I discussed these Dr. Mortimer Fries and ICU NP Hinton Dyer. All the records are reviewed and case discussed with ED provider. Management plans discussed with the patient, her son and they are in agreement.  CODE STATUS: full code.  TOTAL CRITICAL TIME TAKING CARE OF THIS PATIENT: 58 minutes.    Demetrios Loll M.D on 04/06/2017 at 1:00 PM  Between 7am to 6pm - Pager - [phone]  After 6pm go to [email or web address] - Proofreader  [facility]  Office   [phone]  CC: Primary care physician; Glean Hess, MD   Note: This dictation was prepared with Dragon dictation along with smaller phrase technology. Any transcriptional errors that result from this process are unintentional.

## 2017-04-06 NOTE — Consult Note (Signed)
Cardiology Consultation Note  Patient ID: Jenny Cooper, MRN: [id number], DOB/AGE: [id number] 79 y.o. Admit date: 04/06/2017   Date of Consult: 04/06/2017 Primary Physician: Glean Hess, MD Primary Cardiologist: Dr. Rockey Situ, MD Requesting Physician: Dr. Bridgett Larsson, MD  Chief Complaint: SOB Reason for Consult: Acute on chronic systolic CHF  HPI: Jenny Cooper is a 81 y.o. female who is being seen today for the evaluation of acute on chronic systolic CHF at the request of Dr. Bridgett Larsson, MD. Patient has a h/o CAD s/p remote history of MI in 2002 s/p 2 vessel CABG post stenting in 2002, history of mitral valve ring in 2002 secondary to mitral regurgitation, chronic systolic CHF with EF of 89% by echo in 01/2017 (prior EF as low as 30%), history of PAF s/p prior TEE/DCCV on Eliquis, atypical atrial flutter s/p ablation on 07/27/2013, followed by repeat typical atrial flutter and Afib ablation in 04/2016, pleural effusion in 12/2016 in the stting of prednisone and Afib s/p DCCV 01/07/17, sinus node dysfunction, s/p MDT PPM in 2002 with most recent generator change in 2013, prior tobacco abuse quitting in 2001, chronic respiratory failure on 2 L oxygen via nasal cannula at home, carcinoma of right lung s/p chemo and radiation in 2016, COPD, HTN, and HLD who presented to [facility] on 8/8 with increased SOB, weight gain, and lower extremity swelling for the past 5-6 weeks worse over the past 24-48 hours.   Patient is followed by Dr. Rockey Situ for general cardiology in [place] EP for her atrial flutter/Afib. Last outpatient cardiac catheter on 04/28/2015 showed three-vessel CAD with patent SVG to LAD, occluded SVG to OM, native RCA had severe ISR in the midsegment and was occluded distally at sites of previously placed stents. There were left-to-right collaterals. Moderately reduced LV SFA with EF 35-40%. Mild elevated LVEDP. There were no good targets for revascularization. Medical therapy was advised. Most recent echocardiogram  performed at [facility] and 01/2017 showed EF 40%, mild LVH mild aortic insufficiency, mild mitral regurgitation, trivial pulmonic regurgitation, mild tricuspid regurgitation prosthetic mitral valve ring was noted, moderate RV systolic dysfunction. Patient was most recently seen by [place] on 03/07/17 and noted to be in atrial flutter with RVR with heart rates in the 130s bpm. Case was discussed with EP at [facility] and she was started on amiodarone loading. She was also given an event monitor to allow for observation of her rate and arrhythmia burden. Weight at that visit was noted to be 162 pounds.  Patient began to note gradual weight gain approximately 6 weeks ago with associated lower extremity swelling. She continued to take Lasix 20 mg once daily as she never noticed a sudden increase in weight over night of greater than 3 pounds. This was associated with decreased appetite though no early satiety. She rarely would note palpitations though for the most part was asymptomatic from a palpitation standpoint. No chest pain. No fevers or chills. No myalgias. No orthopnea. She has had a mild cough that has been productive of clear sputum. She has not missed any doses of her medications and reportedly well-controlled blood pressure at home. She does not eat out at restaurants and watches her salt intake. She does not drink more than 2 L of fluid daily. On 8/7 she began to notice to shortness of breath was significantly worse though tolerable. On the morning of 8/8 when the patient took off her home oxygen, got out of bed, and went to the restroom she noticed she was significantly more  short of breath and felt very weak. No associated palpitations. She reported an approximate 10 pound weight gain over the past 5-6 weeks with a dry weight of approximately 150 pounds per her report and a weight this morning of 160 pounds. Because of her shortness of breath she contacted EMS.  En route to [facility] patient was placed on CPAP, given to  DuoNeb's and 125 mg of Solu-Medrol by EMS. Upon her arrival she was noted to have a BP of 150/101, heart rate 104 bpm, oxygen saturation 99% on BiPAP, weight 160 pound. Labs showed initial troponin less than 0.03, BNP 1022, lactic acid 2.2, serum creatinine 1.30 with a baseline ranging between 0.9-1.3, potassium 3.6, white blood cell count 9.7, hemoglobin 13.6, platelet count 255, chest x-ray showed new bilateral interstitial opacities favoring pulmonary edema without change and small right pleural effusion, EKG nonacute as below. In the ED patient was given IV Lasix 40 mg 1 and DuoNeb. Currently she remains in atrial flutter with variable AV block with heart rate controlled in the 80s to 90s BPM. She remains on BiPAP. She has minimal urine output noted via Foley catheter. No chest pain.    Past Medical History:  Diagnosis Date  . Atypical atrial flutter ([place])    a. s/p ablation 07/27/2013 followed by Dr. Rockey Situ  . CAD (coronary artery disease)    a. s/p MI x 2 in 2002 s/p PCI x 2 in 2002; b. s/p 2v CABG 2002; c. stress echo 07/2004 w/ evi of pos & inf infarct & no evi of ischemia; d. 4/08 dipyridamole scan w/ multiple areas of infarct, no ischemia, EF 49%; e. cath 04/28/15 3v CAD, med Rx rec, no targets for revasc, LM lum irregs, pLAD 30%, 100%, ost-pLCx 60%, [name] 99%, OM2 100%, p-mRCA 90%, [name] 100% L-R collats, VG-mLAD irregs, VG-OM2 oc  . Carcinoma of right lung ([place]) 01/03/2015   a. followed by Dr. Oliva Bustard  . Chronic systolic CHF (congestive heart failure) ([place])    a. echo 03/2015: EF 30-35%, sev ant/inf/pos HK, in mild to mod MR  . Complication of anesthesia    more recently patient oxygen levels do not rebound as quickly  . Compressed spine fracture ([place]) 08/06/2016   lumbar 2, t11, t12  . COPD (chronic obstructive pulmonary disease) ([place])   . Fractured pelvis ([place]) 05/26/2016   2 places  . GERD (gastroesophageal reflux disease)   . History of blood clots    12/2001  . History of  colonoscopy 2013  . History of mammography, screening 2015  . History of Papanicolaou smear of cervix 2013  . HLD (hyperlipidemia)   . HTN (hypertension)   . Hypothyroidism   . Lung cancer ([place])   . Mitral regurgitation    a. s/p mitral ring placement 09/2000; b. echo 09/2010: EF 50%, inf HK, post AK, mild MR, prosthetic mitral valve ring w/ peak gradient of 10 mmHg; b. echo 2/13: EF 50%, mild MR/TR     . Myocardial infarction ([place])    X 2 (LAST ONE IN 2002)  . Neuropathy   . Pacemaker    a. MDT 2002; b. generator replacement 2013; c. followed by Dr. Omelia Blackwater, MD  . PAF (paroxysmal atrial fibrillation) ([place])    a. on Eliquis   . Personal history of chemotherapy   . Personal history of radiation therapy       Most Recent Cardiac Studies: As above    Surgical History:  Past Surgical History:  Procedure Laterality Date  .  ABLATION  04/2016   [facility]  . APPENDECTOMY    . CARDIAC CATHETERIZATION N/A 04/28/2015   Procedure: Left Heart Cath and Coronary Angiography;  Surgeon: Wellington Hampshire, MD;  Location: [facility];  Service: Cardiovascular;  Laterality: N/A;  . COLONOSCOPY  12/2009   2 small tubular adenomas  . CORONARY ARTERY BYPASS GRAFT  09/2000  . ECTOPIC PREGNANCY SURGERY    . ELECTROMAGNETIC NAVIGATION BROCHOSCOPY N/A 10/06/2015   Procedure: ELECTROMAGNETIC NAVIGATION BRONCHOSCOPY;  Surgeon: Flora Lipps, MD;  Location: [facility] OR[facility];  Service: Cardiopulmonary;  Laterality: N/A;  . ENDOBRONCHIAL ULTRASOUND N/A 10/06/2015   Procedure: ENDOBRONCHIAL ULTRASOUND;  Surgeon: Flora Lipps, MD;  Location: [facility] ORS;  Service: Cardiopulmonary;  Laterality: N/A;  . KYPHOPLASTY N/A 09/28/2016   Procedure: KYPHOPLASTY;  Surgeon: Hessie Knows, MD;  Location: [facility] ORS;  Service: Orthopedics;  Laterality: N/A;  . PACEMAKER INSERTION  03/2012  . thorocentesis  12/24/2016  . VAGINAL HYSTERECTOMY     partial - left ovary remains     Home Meds: Prior to Admission medications   Medication Sig  Start Date End Date Taking? Authorizing Provider  amiodarone (PACERONE) 200 MG tablet Take 1 tablet (200 mg total) by mouth as directed. Take 2 tablets by mouth twice daily for 5 days, then 1 tablet by mouth twice daily Patient taking differently: Take 200 mg by mouth 2 (two) times daily. Take 2 tablets by mouth twice daily for 5 days, then 1 tablet by mouth twice daily 03/07/17  Yes Gollan, Kathlene November, MD  apixaban (ELIQUIS) 5 MG TABS tablet Take 5 mg by mouth 2 (two) times daily.  08/04/16  Yes [provider]  atorvastatin (LIPITOR) 20 MG tablet TAKE 1 TABLET (20 MG TOTAL) BY MOUTH AT BEDTIME. 10/26/16  Yes Glean Hess, MD  Calcium Carb-Cholecalciferol (CALCIUM 600-D PO) Take 2 tablets by mouth every morning.    Yes [provider]  Cetirizine HCl 10 MG CAPS Take 1 capsule (10 mg total) by mouth 1 day or 1 dose. Patient taking differently: Take 1 capsule by mouth daily.  05/19/15  Yes Neville Pauls, Areta Haber, PA-C  furosemide (LASIX) 20 MG tablet TAKE 1 TABLET (20 MG TOTAL) BY MOUTH DAILY AS NEEDED. 03/21/17  Yes Gollan, Kathlene November, MD  levothyroxine (SYNTHROID, LEVOTHROID) 75 MCG tablet TAKE 1 TABLET (75 MCG TOTAL) BY MOUTH DAILY BEFORE BREAKFAST. 11/22/16  Yes Glean Hess, MD  metoprolol succinate (TOPROL-XL) 25 MG 24 hr tablet TAKE 1 TABLET BY MOUTH EVERY MORNING AND 2 TABS IN THE EVENING 03/21/17  Yes Gollan, Kathlene November, MD  omeprazole (PRILOSEC) 20 MG capsule TAKE 1 CAPSULE (20 MG TOTAL) BY MOUTH EVERY MORNING. 03/21/17  Yes Cammie Sickle, MD  predniSONE (DELTASONE) 10 MG tablet Take 1 tablet (10 mg total) by mouth daily with breakfast. 04/01/17  Yes Kasa, Maretta Bees, MD  sacubitril-valsartan (ENTRESTO) 24-26 MG Take 1 tablet by mouth 2 (two) times daily. 01/10/17  Yes Minna Merritts, MD  senna (SENOKOT) 8.6 MG TABS tablet Take 1 tablet (8.6 mg total) by mouth daily. 05/27/16  Yes Veronese, [place], MD  umeclidinium bromide (INCRUSE ELLIPTA) 62.5 MCG/INH AEPB Inhale 1 puff into the  lungs daily. 02/09/17  Yes Flora Lipps, MD  acetaminophen (TYLENOL) 500 MG tablet Take 1,000 mg by mouth every 6 (six) hours.     [provider]    Inpatient Medications:     Allergies:  Allergies  Allergen Reactions  . Lovenox [Enoxaparin Sodium] Itching  . Meperidine  Other (See Comments)    Other Reaction: pt does not like how it makes her feel    Social History   Social History  . Marital status: Widowed    Spouse name: N/A  . Number of children: N/A  . Years of education: N/A   Occupational History  . store clerk   . book-keeper    Social History Main Topics  . Smoking status: Former Smoker    Packs/day: 2.00    Years: 42.00    Types: Cigarettes    Quit date: 08/30/2000  . Smokeless tobacco: Never Used     Comment: quit smoking in 08/28/2000  . Alcohol use 6.0 oz/week    10 Glasses of wine per week     Comment: 2 glasses of wine per day  . Drug use: No  . Sexual activity: Not on file   Other Topics Concern  . Not on file   Social History Narrative  . No narrative on file     Family History  Problem Relation Age of Onset  . COPD Mother        sister, and brother  . Stroke Maternal Grandmother   . Hypertension Sister   . Hypertension Brother   . Colon cancer Brother        age 35  . Heart attack Neg Hx   . Breast cancer Neg Hx      Review of Systems: Review of Systems  Constitutional: Positive for malaise/fatigue. Negative for chills, diaphoresis, fever and weight loss.  HENT: Negative for congestion.   Eyes: Negative for discharge and redness.  Respiratory: Positive for cough, sputum production and shortness of breath. Negative for hemoptysis and wheezing.        Clear sputum  Cardiovascular: Positive for palpitations and leg swelling. Negative for chest pain, orthopnea, claudication and PND.  Gastrointestinal: Negative for abdominal pain, blood in stool, heartburn, melena, nausea and vomiting.  Genitourinary: Negative for hematuria.    Musculoskeletal: Negative for falls and myalgias.  Skin: Negative for rash.  Neurological: Positive for weakness. Negative for dizziness, tingling, tremors, sensory change, speech change, focal weakness and loss of consciousness.  Endo/Heme/Allergies: Does not bruise/bleed easily.  Psychiatric/Behavioral: Negative for substance abuse. The patient is not nervous/anxious.   All other systems reviewed and are negative.   Labs:  Recent Labs  04/06/17 1057  TROPONINI <0.03   Lab Results  Component Value Date   WBC 9.7 04/06/2017   HGB 13.6 04/06/2017   HCT 41.8 04/06/2017   MCV 91.8 04/06/2017   PLT 255 04/06/2017     Recent Labs Lab 04/06/17 1057  NA 139  K 3.6  CL 97*  CO2 30  BUN 22*  CREATININE 1.30*  CALCIUM 9.3  GLUCOSE 178*   Lab Results  Component Value Date   CHOL 128 02/16/2016   HDL 56 02/16/2016   LDLCALC 45 02/16/2016   TRIG 133 02/16/2016   No results found for: DDIMER  Radiology/Studies:  Dg Chest Port 1 View  Result Date: 04/06/2017 IMPRESSION: New bilateral interstitial opacities likely due to pulmonary edema. No change in a small right pleural effusion. Electronically Signed   By: Inge Rise M.D.   On: 04/06/2017 11:17    EKG: Interpreted by me showed: Atrial flutter with RVR, heart rate 103 bpm, rare PVC, nonspecific ST-T changes Telemetry: Interpreted by me showed: Afib/flutter, 90s bpm  Weights: Filed Weights   04/06/17 1057  Weight: 160 lb (72.6 kg)     Physical  Exam: Blood pressure 110/80, pulse 92, resp. rate (!) 22, height 5\' 6"  (1.676 m), weight 160 lb (72.6 kg), SpO2 100 %. Body mass index is 25.82 kg/m. General: Well developed, well nourished, in no acute distress.Wearing BiPAP. Head: Normocephalic, atraumatic, sclera non-icteric, no xanthomas, nares are without discharge.  Neck: Negative for carotid bruits. JVD elevated approximately 6-8 cm. Lungs: Diminished breath sounds bilaterally with basilar crackles right greater  than left. Breathing is unlabored. Heart: RRR with S1 S2. II/VI systolic murmur, no rubs, or gallops appreciated. Abdomen: Soft, non-tender, non-distended with normoactive bowel sounds. No hepatomegaly. No rebound/guarding. No obvious abdominal masses. Msk:  Strength and tone appear normal for age. Extremities: No clubbing or cyanosis. Trace bilateral pedal edema. Distal pedal pulses are 2+ and equal bilaterally. Neuro: Alert and oriented X 3. No facial asymmetry. No focal deficit. Moves all extremities spontaneously. Psych:  Responds to questions appropriately with a normal affect.    Assessment and Plan:  Principal Problem:   Acute on chronic respiratory failure with hypoxia (HCC) Active Problems:   Coronary artery disease involving coronary bypass graft of native heart without angina pectoris   Acute on chronic systolic (congestive) heart failure (HCC)   Atrial flutter (HCC)   PAF (paroxysmal atrial fibrillation) (HCC)   Chronic obstructive pulmonary disease ([place])   Lung cancer ([place])   Sinus node dysfunction ([place])   Pacemaker    1. Acute on chronic respiratory failure: -Currently requiring BiPAP -Wean as able -Likely in the setting of tachycardia mediated cardiomyopathy -Less likely infection at this time -Rule out ischemia  2. Acute on chronic systolic CHF/ICM: -She does appear volume overloaded on exam -Likely in the setting of tachycardia mediated cardiomyopathy -Check transthoracic echocardiogram to evaluate for reduction in ejection fraction -Agree with IV diuresis with Lasix with KCl repletion -Continue Toprol -May need to hold Entresto given hypotension, monitor -Defer initiation of spironolactone to primary cardiologist -Strict I's and O's and daily weights  3. Atrial flutter with RVR: -Remains in atrial flutter with variable AV block at this time -Ventricular rate well controlled in the 80s to 90s bpm -She does report at home heart rate readings greater than  200 bpm, we'll have our office investigate to see if any alarms were triggered -Outpatient event monitoring system remains at home, recommend family bring this to our office for evaluation -Continue Toprol for rate control -It appears amiodarone has been unsuccessful in preventing these tachyarrhythmias -There is concern that her acute on chronic respiratory failure is in the setting of acute on chronic systolic CHF exacerbated by tachycardia mediated cardiomyopathy -Would benefit from EP evaluation while inpatient -For now, continue amiodarone, consider reloading if ventricular rate becomes uncontrolled -Continue Eliquis  4. CAD as above: -No angina -Continue to cycle troponin to rule out -Check transthoracic echocardiogram as above -On Eliquis in place of ASA -Continue Toprol-XL and Lipitor -No plans for inpatient ischemic vibration and less troponins trend positive or patient has new echo findings suggestive of ischemia  5. Valvular heart disease: -Recent echocardiogram in 01/2017 at [facility] as above -TTE pending  6. Lung carcinoma status post chemotherapy/radiation: -Per IM  7. COPD: -Does not appear to be active exacerbation at this time -Per IM  8. Hypertension: -Controlled to mildly hypotensive in the ED -Would recommend holding Entresto for systolic blood pressure less than 110 mmHg  9. AKI: -Monitor with diuresis as above   Signed, Christell Faith, PA-C [facility] Pager: [phone] 04/06/2017, 1:50 PM

## 2017-04-06 NOTE — ED Provider Notes (Signed)
[facility] Emergency Department Provider Note       Time seen: ----------------------------------------- 10:57 AM on 04/06/2017 -----------------------------------------  Level V caveat: History/ROS limited by altered mental status   I have reviewed the triage vital signs and the nursing notes.   HISTORY   Chief Complaint Shortness of Breath    HPI Jenny Cooper is a 81 y.o. female who presents to the ED for shortness of breath. Reportedly she has a history of lung cancer and has had worsening shortness of breath onset this morning. Patient prior to arrival received 2 DuoNeb as well as IV Solu-Medrol was placed on CPAP by EMS. She typically takes 2 L of nasal cannula oxygen all the time. She is not having any pain and has not had symptoms of pneumonia.   Past Medical History:  Diagnosis Date  . Atypical atrial flutter ([place])    a. s/p ablation 07/27/2013 followed by Dr. Rockey Situ  . CAD (coronary artery disease)    a. s/p MI x 2 in 2002 s/p PCI x 2 in 2002; b. s/p 2v CABG 2002; c. stress echo 07/2004 w/ evi of pos & inf infarct & no evi of ischemia; d. 4/08 dipyridamole scan w/ multiple areas of infarct, no ischemia, EF 49%; e. cath 04/28/15 3v CAD, med Rx rec, no targets for revasc, LM lum irregs, pLAD 30%, 100%, ost-pLCx 60%, [name] 99%, OM2 100%, p-mRCA 90%, [name] 100% L-R collats, VG-mLAD irregs, VG-OM2 oc  . Carcinoma of right lung ([place]) 01/03/2015   a. followed by Dr. Oliva Bustard  . Chronic systolic CHF (congestive heart failure) ([place])    a. echo 03/2015: EF 30-35%, sev ant/inf/pos HK, in mild to mod MR  . Complication of anesthesia    more recently patient oxygen levels do not rebound as quickly  . Compressed spine fracture ([place]) 08/06/2016   lumbar 2, t11, t12  . COPD (chronic obstructive pulmonary disease) ([place])   . Fractured pelvis ([place]) 05/26/2016   2 places  . GERD (gastroesophageal reflux disease)   . History of blood clots    12/2001  . History  of colonoscopy 2013  . History of mammography, screening 2015  . History of Papanicolaou smear of cervix 2013  . HLD (hyperlipidemia)   . HTN (hypertension)   . Hypothyroidism   . Lung cancer ([place])   . Mitral regurgitation    a. s/p mitral ring placement 09/2000; b. echo 09/2010: EF 50%, inf HK, post AK, mild MR, prosthetic mitral valve ring w/ peak gradient of 10 mmHg; b. echo 2/13: EF 50%, mild MR/TR     . Myocardial infarction ([place])    X 2 (LAST ONE IN 2002)  . Neuropathy   . Pacemaker    a. MDT 2002; b. generator replacement 2013; c. followed by Dr. Omelia Blackwater, MD  . PAF (paroxysmal atrial fibrillation) ([place])    a. on Eliquis   . Personal history of chemotherapy   . Personal history of radiation therapy     Patient Active Problem List   Diagnosis Date Noted  . Back pain of thoracolumbar region 03/15/2017  . Paroxysmal atrial fibrillation ([place]) 03/05/2017  . Falls frequently 03/05/2017  . Pleural effusion, right 12/23/2016  . Cancer of hilus of right lung ([place]) 12/14/2016  . Compression fracture of L2 lumbar vertebra ([place]) 08/31/2016  . Acute midline low back pain without sciatica 08/13/2016  . Fracture of multiple pubic rami, right, closed, initial encounter ([place]) 05/30/2016  . Cardiomyopathy ([place]) 04/29/2016  .  Type 2 diabetes mellitus with hemoglobin A1c goal of less than 7.0% ([place]) 11/17/2015  . Hx of adenomatous colonic polyps 11/17/2015  . Radiation pneumonitis ([place]) 10/21/2015  . Chronic systolic CHF (congestive heart failure) ([place])   . Mitral regurgitation   . HLD (hyperlipidemia)   . Paroxysmal supraventricular tachycardia ([place])   . Coronary artery disease involving coronary bypass graft of native heart without angina pectoris   . NSTEMI (non-ST elevated myocardial infarction) ([place])   . Arteriosclerosis of coronary artery 01/11/2015  . Hypothyroidism (acquired) 01/11/2015  . Disorder of peripheral nervous system 10/04/2014  . Cervical radiculopathy, chronic  10/04/2014  . Atrial flutter ([place]) 11/16/2013  . Chronic obstructive pulmonary disease ([place]) 04/24/2012  . Hypertension 08/03/2011  . Artificial cardiac pacemaker 08/03/2011    Past Surgical History:  Procedure Laterality Date  . ABLATION  04/2016   [facility]  . APPENDECTOMY    . CARDIAC CATHETERIZATION N/A 04/28/2015   Procedure: Left Heart Cath and Coronary Angiography;  Surgeon: Wellington Hampshire, MD;  Location: [facility];  Service: Cardiovascular;  Laterality: N/A;  . COLONOSCOPY  12/2009   2 small tubular adenomas  . CORONARY ARTERY BYPASS GRAFT  09/2000  . ECTOPIC PREGNANCY SURGERY    . ELECTROMAGNETIC NAVIGATION BROCHOSCOPY N/A 10/06/2015   Procedure: ELECTROMAGNETIC NAVIGATION BRONCHOSCOPY;  Surgeon: Flora Lipps, MD;  Location: [facility] ORS;  Service: Cardiopulmonary;  Laterality: N/A;  . ENDOBRONCHIAL ULTRASOUND N/A 10/06/2015   Procedure: ENDOBRONCHIAL ULTRASOUND;  Surgeon: Flora Lipps, MD;  Location: [facility] ORS;  Service: Cardiopulmonary;  Laterality: N/A;  . KYPHOPLASTY N/A 09/28/2016   Procedure: KYPHOPLASTY;  Surgeon: Hessie Knows, MD;  Location: [facility] ORS;  Service: Orthopedics;  Laterality: N/A;  . PACEMAKER INSERTION  03/2012  . thorocentesis  12/24/2016  . VAGINAL HYSTERECTOMY     partial - left ovary remains    Allergies Lovenox [enoxaparin sodium] and Meperidine  Social History Social History  Substance Use Topics  . Smoking status: Former Smoker    Packs/day: 2.00    Years: 42.00    Types: Cigarettes    Quit date: 08/30/2000  . Smokeless tobacco: Never Used     Comment: quit smoking in 08/28/2000  . Alcohol use 6.0 oz/week    10 Glasses of wine per week     Comment: 2 glasses of wine per day    Review of Systems Constitutional: Negative for fever. Cardiovascular: Negative for chest pain. Respiratory: Positive shortness of breath Gastrointestinal: Negative for abdominal pain, vomiting and diarrhea. Musculoskeletal: Negative for back pain. Skin: Negative  for rash. Neurological: Negative for headaches, focal weakness or numbness.  All systems negative/normal/unremarkable except as stated in the HPI  ____________________________________________   PHYSICAL EXAM:  VITAL SIGNS: ED Triage Vitals [04/06/17 1057]  Enc Vitals Group     BP      Pulse      Resp      Temp      Temp src      SpO2      Weight      Height      Head Circumference      Peak Flow      Pain Score 0     Pain Loc      Pain Edu?      Excl. in [place]?     Constitutional: Alert and oriented. Mild distress on CPAP Eyes: Conjunctivae are normal. Normal extraocular movements. ENT   Head: Normocephalic and atraumatic.   Nose: No congestion/rhinnorhea.   Mouth/Throat:  Mucous membranes are moist.   Neck: No stridor. Cardiovascular: Normal rate, regular rhythm. No murmurs, rubs, or gallops. Respiratory: Tachypnea with wheezing and diminished breath sounds on the right Gastrointestinal: Soft and nontender. Normal bowel sounds Musculoskeletal: Nontender with normal range of motion in extremities. No lower extremity tenderness nor edema. Neurologic:  Normal speech and language. No gross focal neurologic deficits are appreciated.  Skin:  Skin is warm, dry and intact. Mottled appearance to the skin with some cyanosis Psychiatric: Mood and affect are normal. Speech and behavior are normal.  ____________________________________________  EKG: Interpreted by me.Atrial fibrillation with a rate of 103 bpm, normal QRS size, PVC, normal QT, normal axis, T-wave flattening  ____________________________________________  ED COURSE:  Pertinent labs & imaging results that were available during my care of the patient were reviewed by me and considered in my medical decision making (see chart for details). Patient presents for respiratory distress, we will assess with labs and imaging as indicated.   Procedures ____________________________________________   LABS  (pertinent positives/negatives)  Labs Reviewed  CBC WITH DIFFERENTIAL/PLATELET - Abnormal; Notable for the following:       Result Value   RDW 18.0 (*)    Neutro Abs 7.7 (*)    Lymphs Abs 0.9 (*)    All other components within normal limits  BASIC METABOLIC PANEL - Abnormal; Notable for the following:    Chloride 97 (*)    Glucose, Bld 178 (*)    BUN 22 (*)    Creatinine, Ser 1.30 (*)    GFR calc non Af Amer 38 (*)    GFR calc Af Amer 44 (*)    All other components within normal limits  BRAIN NATRIURETIC PEPTIDE - Abnormal; Notable for the following:    B Natriuretic Peptide 1,022.0 (*)    All other components within normal limits  LACTIC ACID, PLASMA - Abnormal; Notable for the following:    Lactic Acid, Venous 2.2 (*)    All other components within normal limits  BLOOD GAS, VENOUS - Abnormal; Notable for the following:    pCO2, Ven 70 (*)    Bicarbonate 36.1 (*)    Acid-Base Excess 7.3 (*)    All other components within normal limits  CULTURE, BLOOD (ROUTINE X 2)  CULTURE, BLOOD (ROUTINE X 2)  TROPONIN I  LACTIC ACID, PLASMA   CRITICAL CARE Performed by: Earleen Newport   Total critical care time: 30 minutes  Critical care time was exclusive of separately billable procedures and treating other patients.  Critical care was necessary to treat or prevent imminent or life-threatening deterioration.  Critical care was time spent personally by me on the following activities: development of treatment plan with patient and/or surrogate as well as nursing, discussions with consultants, evaluation of patient's response to treatment, examination of patient, obtaining history from patient or surrogate, ordering and performing treatments and interventions, ordering and review of laboratory studies, ordering and review of radiographic studies, pulse oximetry and re-evaluation of patient's condition.  RADIOLOGY Images were viewed by me  Chest x-ray IMPRESSION: New bilateral  interstitial opacities likely due to pulmonary edema.  No change in a small right pleural effusion. ____________________________________________  FINAL ASSESSMENT AND PLAN  Respiratory distress, CHF exacerbation, COPD exacerbation  Plan: Patient's labs and imaging were dictated above. Patient had presented for shortness of breath and was transferred from CPAP to BiPAP. She was found to be retaining significant amounts of carbon dioxide but also showed evidence of CHF. This is likely a  combination of both. We did give additional IV Lasix and currently she is diuresing. I will discuss with the hospitalist for admission.   Earleen Newport, MD   Note: This note was generated in part or whole with voice recognition software. Voice recognition is usually quite accurate but there are transcription errors that can and very often do occur. I apologize for any typographical errors that were not detected and corrected.     Earleen Newport, MD 04/06/17 1225

## 2017-04-06 NOTE — Progress Notes (Signed)
Report called to Amy, RN on 2A.  Patient has been A&Ox4.  Afib per cardiac monitor.  No respiratory distress on 3L nasal cannula.

## 2017-04-06 NOTE — Progress Notes (Signed)
Patient arrived from CCU.  VSS.  Patient afib on monitor.  No complaints of pain.

## 2017-04-07 LAB — ECHOCARDIOGRAM COMPLETE
Height: 66 in
WEIGHTICAEL: 2560 [oz_av]

## 2017-04-07 LAB — BASIC METABOLIC PANEL
Anion gap: 11 (ref 5–15)
BUN: 26 mg/dL — AB (ref 6–20)
CALCIUM: 9.2 mg/dL (ref 8.9–10.3)
CO2: 29 mmol/L (ref 22–32)
CREATININE: 1.16 mg/dL — AB (ref 0.44–1.00)
Chloride: 97 mmol/L — ABNORMAL LOW (ref 101–111)
GFR calc Af Amer: 51 mL/min — ABNORMAL LOW (ref >60)
GFR calc non Af Amer: 44 mL/min — ABNORMAL LOW (ref >60)
GLUCOSE: 201 mg/dL — AB (ref 65–99)
Potassium: 3.3 mmol/L — ABNORMAL LOW (ref 3.5–5.1)
Sodium: 137 mmol/L (ref 135–145)

## 2017-04-07 LAB — CBC
HCT: 36.6 % (ref 35.0–47.0)
HEMOGLOBIN: 12.3 g/dL (ref 12.0–16.0)
MCH: 30.4 pg (ref 26.0–34.0)
MCHC: 33.7 g/dL (ref 32.0–36.0)
MCV: 90.2 fL (ref 80.0–100.0)
PLATELETS: 209 10*3/uL (ref 150–440)
RBC: 4.06 MIL/uL (ref 3.80–5.20)
RDW: 17.8 % — AB (ref 11.5–14.5)
WBC: 5.7 10*3/uL (ref 3.6–11.0)

## 2017-04-07 LAB — LACTIC ACID, PLASMA: LACTIC ACID, VENOUS: 2.9 mmol/L — AB (ref 0.5–1.9)

## 2017-04-07 MED ORDER — SODIUM CHLORIDE 0.9 % IV SOLN: 250.0000 mL | INTRAVENOUS | Status: DC

## 2017-04-07 MED ORDER — METOPROLOL SUCCINATE ER 25 MG PO TB24: 25.0000 mg | ORAL_TABLET | Freq: Every day | ORAL | Status: DC

## 2017-04-07 MED ORDER — SODIUM CHLORIDE 0.9% FLUSH
3.0000 mL | Freq: Two times a day (BID) | INTRAVENOUS | Status: DC
  Administered 2017-04-07 – 2017-04-08 (×3): 3 mL via INTRAVENOUS

## 2017-04-07 MED ORDER — SODIUM CHLORIDE 0.9 % IV BOLUS (SEPSIS)
500.0000 mL | Freq: Once | INTRAVENOUS | Status: AC
  Administered 2017-04-07: 500 mL via INTRAVENOUS

## 2017-04-07 MED ORDER — POTASSIUM CHLORIDE CRYS ER 20 MEQ PO TBCR
40.0000 meq | EXTENDED_RELEASE_TABLET | Freq: Once | ORAL | Status: AC
  Administered 2017-04-07: 40 meq via ORAL
  Filled 2017-04-07: qty 2

## 2017-04-07 MED ORDER — SODIUM CHLORIDE 0.9% FLUSH: 3.0000 mL | INTRAVENOUS | Status: DC | PRN

## 2017-04-07 NOTE — Consult Note (Addendum)
1215 - 1300  Patient admitted to hospital for Acute on Chronic CHF/Respiratory Failure.  BNP 1,022 on admission.  Troponin < 0.03.  Discussed dx of Acute on Chronic HF with patient and current EF of 30 - 35%  Patient informed me her previous EF was 40%.  Booklet, "Living Better with Heart Failure"  Given and reviewed with patient.  Patient stated she was not aware you could have fluid in your abdomen until now.  She thought she was just gaining weight in her middle section.  Patient also describes fluid around her neck at times.  Patient explained to this RN that the doctors are thinking her afib contributed to this exacerbation.  Patient stated she does weigh herself everyday; takes meds as prescribed; does her house work/chores and drives herself to appointments; follows a low sodium diet.  Patient stated she does have limitations with her activity given SOB and back issues.  Patient informed me she wears oxygen at home at night only.  Patient currently using oxygen via [place].  Patient informed me she does not smoke.  Reviewed the different types of medications that are prescribed for heart failure with patient.  Discussed Cardiac Rehab.  Informed patient per Medicare Guidelines she qualifies for Cardiac Rehab with dx of CHF and would be able to participate once she has been out of the hospital and stable for 6 weeks.  Initially, patient was not interested at all.  As we talked more about the program patient became more interested and stated she would talk with her daughters about this.  She stated she was unaware of this program at [facility]  [facility] Cardiac Rehab brochure given to patient.  Circled the phone number for patient so she can call if she has any questions.  Also informed patient she has an appointment in the HF Clinic on 04/14/2017 at 11:40 a.m.  I explained the role of the HF Clinic and stressed that this is an additional resource and DOES NOT replace her cardiologist.  Patient appreciative of my time and  information  Diane Joya Gaskins RN, BSN, [facility] Cardiovascular and Pulmonary Nurse Navigator

## 2017-04-07 NOTE — Progress Notes (Signed)
Progress Note  Patient Name: Jenny Cooper Date of Encounter: 04/07/2017  Primary Cardiologist: Johnny Bridge, MD / Seward Speck, MD (EP - [facility])  Subjective   Breathing improved, though not yet @ baseline.  Abd still feels a little firm.  Remains in aflutter - rate in 80's.  Inpatient Medications    Scheduled Meds: . albuterol  2.5 mg Nebulization Q6H  . amiodarone  200 mg Oral BID  . apixaban  5 mg Oral BID  . atorvastatin  20 mg Oral QHS  . furosemide  40 mg Intravenous Q12H  . levothyroxine  75 mcg Oral QAC breakfast  . loratadine  10 mg Oral Daily  . mouth rinse  15 mL Mouth Rinse BID  . methylPREDNISolone (SOLU-MEDROL) injection  40 mg Intravenous Q12H  . metoprolol succinate  50 mg Oral QHS  . pantoprazole  40 mg Oral Daily  . potassium chloride  40 mEq Oral Once  . sacubitril-valsartan  1 tablet Oral BID  . sodium chloride flush  3 mL Intravenous Q12H  . umeclidinium bromide  1 puff Inhalation Daily   Continuous Infusions: . sodium chloride     PRN Meds: sodium chloride, acetaminophen **OR** acetaminophen, albuterol, bisacodyl, ondansetron **OR** ondansetron (ZOFRAN) IV, senna, senna-docusate, sodium chloride flush   Vital Signs    Vitals:   04/07/17 0236 04/07/17 0439 04/07/17 0732 04/07/17 0830  BP:  118/61  121/70  Pulse:  (!) 101  95  Resp:  (!) 22  20  Temp:  97.8 F (36.6 C)  97.7 F (36.5 C)  TempSrc:  Oral  Oral  SpO2: 95% 92% 93% 94%  Weight:  159 lb 9.6 oz (72.4 kg)    Height:        Intake/Output Summary (Last 24 hours) at 04/07/17 1023 Last data filed at 04/07/17 0955  Gross per 24 hour  Intake              720 ml  Output             2200 ml  Net            -1480 ml   Filed Weights   04/06/17 1057 04/07/17 0439  Weight: 160 lb (72.6 kg) 159 lb 9.6 oz (72.4 kg)    Physical Exam   GEN: Well nourished, well developed, in no acute distress.  HEENT: Grossly normal.  Neck: Supple, JVP ~ 12 cm, no carotid bruits, or masses. Cardiac: IR,  IR, no murmurs, rubs, or gallops. No clubbing, cyanosis, edema.  Radials/DP/PT 2+ and equal bilaterally.  Respiratory:  Respirations regular and unlabored, clear to auscultation bilaterally. GI: Soft, semi-firm, nondistended, BS + x 4. MS: no deformity or atrophy. Skin: warm and dry, no rash. Neuro:  Strength and sensation are intact. Psych: AAOx3.  Normal affect.  Labs    Chemistry Recent Labs Lab 04/06/17 1057 04/07/17 0539  NA 139 137  K 3.6 3.3*  CL 97* 97*  CO2 30 29  GLUCOSE 178* 201*  BUN 22* 26*  CREATININE 1.30* 1.16*  CALCIUM 9.3 9.2  GFRNONAA 38* 44*  GFRAA 44* 51*  ANIONGAP 12 11     Hematology Recent Labs Lab 04/06/17 1057 04/07/17 0539  WBC 9.7 5.7  RBC 4.55 4.06  HGB 13.6 12.3  HCT 41.8 36.6  MCV 91.8 90.2  MCH 29.9 30.4  MCHC 32.6 33.7  RDW 18.0* 17.8*  PLT 255 209    Cardiac Enzymes Recent Labs Lab 04/06/17 1057  TROPONINI <  0.03     BNP Recent Labs Lab 04/06/17 1057  BNP 1,022.0*      Radiology    Dg Chest Port 1 View  Result Date: 04/06/2017 CLINICAL DATA:  Difficulty breathing since yesterday, worse today. EXAM: PORTABLE CHEST 1 VIEW COMPARISON:  PA and lateral chest 03/15/2017. FINDINGS: There are new bilateral interstitial opacities. Small right effusion is unchanged. No pneumothorax. Heart size is normal. Pacing device and Port-A-Cath are again seen. The patient is status post mitral valve repair. IMPRESSION: New bilateral interstitial opacities likely due to pulmonary edema. No change in a small right pleural effusion. Electronically Signed   By: Inge Rise M.D.   On: 04/06/2017 11:17    Telemetry    Aflutter - atypical, 80's - Personally Reviewed  Cardiac Studies   2D Echocardiogram 8.9.2018 - pending  Patient Profile   81 year old woman with history of chronic systolic heart failure, coronary artery disease status post 2 vessel CABG, mitral regurgitation status post repair, paroxysmal atrial fibrillation/atypical  flutter followed at [facility], and sinus node dysfunction status post pacemaker, who was admitted with dyspnea and volume overload.  Assessment & Plan    1.  Acute on chronic systolic CHF/ICM: prior h/o LV dysfxn with EF of 40% by echo 01/2017 @ [facility].  Noted to be in rapid atrial flutter @ clinic visit with Dr. Rockey Situ in July.  Placed on amio @ that time. Progressive DOE, abd girth, and wt gain @ home since.  She presented to [facility] with dyspnea req BiPAP and volume overload/pulmonary edema.  Minus 1.48L since admission and wt is down 1 lb.  Dry wt appears to be closer to 156-157. She is feeling better but still with mild volume overload, JVD, and abd distension.  Cont IV lasix today.  Cont  blocker, entresto.  I suspect rapid rates and loss of atrial kick have been driving volume excess.  2.  PAF/Flutter: As above, she has been developing CHF Ss since recurrent aflutter noted.  Placed on Amio in July.  Rates stable in the 80's.  Cont eliquis. She reports compliance with oral anticoagulation.  Will likely need DCCV prior to discharge.  Will d/w Dr. Fletcher Anon.  3.  CAD: s/p prior CABG with cath 03/2015 revealing native 3VD and patent VG  LAD, occluded VG  OM, and severe ISR in the mid RCA w/ distal RCA occlusion (L  R collats present).  She has been medically managed since.  Despite progressive dyspnea and CHF, troponin was nl on admission.  Cont  blocker, statin.  No asa in setting of eliquis.  4.  Valvular Heart Dzs: Known mild AI/MR/TR, and triv PR.  MV ring placed @ time of CABG.  F/u echo pending.  5.  COPD: H/o COPD with O2 @ night.  Not wheezing today.  Presentation more likely consistent with CHF.  6.  Essential HTN:  Stable on  blocker and entresto.  7.  AKI:  Creat improved.  Follow.  Signed, Murray Hodgkins, NP  04/07/2017, 10:23 AM

## 2017-04-07 NOTE — Progress Notes (Addendum)
[place] at [place] NAME: Jenny Cooper    MR#:  [id number]  DATE OF BIRTH:  1936-03-19  SUBJECTIVE:   Patient reports that shortness of breath has improved. Still has some wheezing.  REVIEW OF SYSTEMS:    Review of Systems  Constitutional: Negative for fever, chills weight loss HENT: Negative for ear pain, nosebleeds, congestion, facial swelling, rhinorrhea, neck pain, neck stiffness and ear discharge.   Respiratory: Positive shortness of breath cough and wheezing Cardiovascular: Negative for chest pain, palpitations and leg swelling.  Gastrointestinal: Negative for heartburn, abdominal pain, vomiting, diarrhea or consitpation Genitourinary: Negative for dysuria, urgency, frequency, hematuria Musculoskeletal: Negative for back pain or joint pain Neurological: Negative for dizziness, seizures, syncope, focal weakness,  numbness and headaches.  Hematological: Does not bruise/bleed easily.  Psychiatric/Behavioral: Negative for hallucinations, confusion, dysphoric mood    Tolerating Diet: yes      DRUG ALLERGIES:   Allergies  Allergen Reactions  . Lovenox [Enoxaparin Sodium] Itching  . Meperidine Other (See Comments)    Other Reaction: pt does not like how it makes her feel    VITALS:  Blood pressure 121/70, pulse 95, temperature 97.7 F (36.5 C), temperature source Oral, resp. rate 20, height 5\' 6"  (1.676 m), weight 72.4 kg (159 lb 9.6 oz), SpO2 94 %.  PHYSICAL EXAMINATION:  Constitutional: Appears well-developed and well-nourished. No distress. HENT: Normocephalic. Marland Kitchen Oropharynx is clear and moist.  Eyes: Conjunctivae and EOM are normal. PERRLA, no scleral icterus.  Neck: Normal ROM. Neck supple. No JVD. No tracheal deviation. CVS: irr, irr tachycardia ++3/6 SEM NO  gallops, no carotid bruit.  Pulmonary: Effort normal, bilateral wheezing with crackles noted at bases Abdominal: Soft. BS +,  no distension, tenderness, rebound  or guarding.  Musculoskeletal: Normal range of motion. No edema and no tenderness.  Neuro: Alert. CN 2-12 grossly intact. No focal deficits. Skin: Skin is warm and dry. No rash noted. Psychiatric: Normal mood and affect.      LABORATORY PANEL:   CBC  Recent Labs Lab 04/07/17 0539  WBC 5.7  HGB 12.3  HCT 36.6  PLT 209   ------------------------------------------------------------------------------------------------------------------  Chemistries   Recent Labs Lab 04/06/17 1058 04/07/17 0539  NA  --  137  K  --  3.3*  CL  --  97*  CO2  --  29  GLUCOSE  --  201*  BUN  --  26*  CREATININE  --  1.16*  CALCIUM  --  9.2  MG 1.8  --    ------------------------------------------------------------------------------------------------------------------  Cardiac Enzymes  Recent Labs Lab 04/06/17 1057  TROPONINI <0.03   ------------------------------------------------------------------------------------------------------------------  RADIOLOGY:  Dg Chest Port 1 View  Result Date: 04/06/2017 CLINICAL DATA:  Difficulty breathing since yesterday, worse today. EXAM: PORTABLE CHEST 1 VIEW COMPARISON:  PA and lateral chest 03/15/2017. FINDINGS: There are new bilateral interstitial opacities. Small right effusion is unchanged. No pneumothorax. Heart size is normal. Pacing device and Port-A-Cath are again seen. The patient is status post mitral valve repair. IMPRESSION: New bilateral interstitial opacities likely due to pulmonary edema. No change in a small right pleural effusion. Electronically Signed   By: Inge Rise M.D.   On: 04/06/2017 11:17     ASSESSMENT AND PLAN:   81 year old female with history of chronic systolic heart failure, CAD status post 2 vessel CABG, atrial flutter/fibrillation who presented with shortness of breath.  1. Acute on chronic hypoxic respiratory failure in the setting of tachycardia mediated  cardiomyopathy, pulmonary edema Patient has been  weaned off BiPAP and no nasal cannula. She wears 2 L of oxygen at night  2. Acute on chronic systolic congestive heart failure in the setting of tachycardia mediated cardiomyopathy Echocardiogram results pending Cardiology consultation appreciated Continue Lasix Monitor intake and output with daily weight Continue Entresto  3. Atrial flutter/fibrillation: Plan for cardioversion today Continue amiodarone, metoprolol and Eliquis  4. Hypothyroid: Continue Synthroid  5. Acute COPD exacerbation: Wean steroids as tolerated Continue nebs  6. CAD: Continue atorvastatin and metoprolol  7. Hypokalemia: Replete and recheck in a.m.  8. Elevated blood sugars from steroids A1c in April was 5.3  Management plans discussed with the patient and family and they are in agreement.  CODE STATUS: full  TOTAL TIME TAKING CARE OF THIS PATIENT: 30 minutes.     POSSIBLE D/C 2 days, DEPENDING ON CLINICAL CONDITION.   Jassiah Viviano M.D on 04/07/2017 at 8:37 AM  Between 7am to 6pm - Pager - [phone] After 6pm go to [email or web address] - password [email or web address] [facility]  Office  [phone]  CC: Primary care physician; Glean Hess, MD  Note: This dictation was prepared with Dragon dictation along with smaller phrase technology. Any transcriptional errors that result from this process are unintentional.

## 2017-04-07 NOTE — Care Management (Addendum)
CM consult for CHF screen. Admitted with exacerbation of CHF.  Initially placed in ICU stepdown due to need for continuous bipap. Patient has nocturnal oxygen with [facility].  Confirms she does not have portable tanks for travel.  Independent in all adls, denies issues accessing medical care, obtaining medications or with transportation.  Current with her PCP.  Has access to scales.  Heart Failure referral consideration. Requested exertional room air sat because reported during progression patient had resting room air sat of 90%.  She declines need for any home health follow up.  She will consider Heart Failure Clinic referral.

## 2017-04-07 NOTE — Progress Notes (Signed)
Patient remains alert and oriented, denies any pain at this time, assisted to bathroom as needed, patient continue to have dyspnea, expiratory wheezing , VSS encouraged patient to IS as per order .

## 2017-04-08 ENCOUNTER — Encounter: Payer: Self-pay | Admitting: Anesthesiology

## 2017-04-08 ENCOUNTER — Encounter
Admission: EM | Disposition: A | Discharge status: Home / Self Care | Payer: Self-pay | Source: Home / Self Care | Attending: Internal Medicine

## 2017-04-08 ENCOUNTER — Inpatient Hospital Stay: Payer: Medicare Other | Admitting: Certified Registered"

## 2017-04-08 DIAGNOSIS — I4892 Unspecified atrial flutter: Secondary | ICD-10-CM

## 2017-04-08 HISTORY — PX: CARDIOVERSION: EP1203

## 2017-04-08 LAB — BASIC METABOLIC PANEL
Anion gap: 10 (ref 5–15)
BUN: 29 mg/dL — AB (ref 6–20)
CALCIUM: 9.1 mg/dL (ref 8.9–10.3)
CO2: 31 mmol/L (ref 22–32)
CREATININE: 0.98 mg/dL (ref 0.44–1.00)
Chloride: 100 mmol/L — ABNORMAL LOW (ref 101–111)
GFR, EST NON AFRICAN AMERICAN: 53 mL/min — AB (ref >60)
Glucose, Bld: 148 mg/dL — ABNORMAL HIGH (ref 65–99)
Potassium: 4.2 mmol/L (ref 3.5–5.1)
SODIUM: 141 mmol/L (ref 135–145)

## 2017-04-08 LAB — LACTIC ACID, PLASMA: LACTIC ACID, VENOUS: 2.4 mmol/L — AB (ref 0.5–1.9)

## 2017-04-08 SURGERY — CARDIOVERSION (CATH LAB)
Anesthesia: General

## 2017-04-08 MED ORDER — PREDNISONE 20 MG PO TABS: 40.0000 mg | ORAL_TABLET | Freq: Every day | ORAL | Status: DC

## 2017-04-08 MED ORDER — IPRATROPIUM-ALBUTEROL 0.5-2.5 (3) MG/3ML IN SOLN
RESPIRATORY_TRACT | Status: AC
  Filled 2017-04-08: qty 3

## 2017-04-08 MED ORDER — PROPOFOL 10 MG/ML IV BOLUS
INTRAVENOUS | Status: DC | PRN
  Administered 2017-04-08: 10 mg via INTRAVENOUS
  Administered 2017-04-08: 40 mg via INTRAVENOUS

## 2017-04-08 MED ORDER — ALBUTEROL SULFATE (2.5 MG/3ML) 0.083% IN NEBU: 2.5000 mg | INHALATION_SOLUTION | Freq: Four times a day (QID) | RESPIRATORY_TRACT | Status: DC | PRN

## 2017-04-08 MED ORDER — FUROSEMIDE 40 MG PO TABS: 40.0000 mg | ORAL_TABLET | Freq: Two times a day (BID) | ORAL | 0 refills | Status: DC

## 2017-04-08 MED ORDER — FUROSEMIDE 40 MG PO TABS
40.0000 mg | ORAL_TABLET | Freq: Every day | ORAL | 1 refills | Status: DC
Start: 2017-04-08 — End: 2017-04-08

## 2017-04-08 MED ORDER — IPRATROPIUM-ALBUTEROL 0.5-2.5 (3) MG/3ML IN SOLN
3.0000 mL | Freq: Once | RESPIRATORY_TRACT | Status: AC
  Administered 2017-04-08: 3 mL via RESPIRATORY_TRACT

## 2017-04-08 MED ORDER — ALBUTEROL SULFATE (2.5 MG/3ML) 0.083% IN NEBU: 2.5000 mg | INHALATION_SOLUTION | RESPIRATORY_TRACT | Status: DC | PRN

## 2017-04-08 NOTE — Anesthesia Postprocedure Evaluation (Signed)
Anesthesia Post Note  Patient: Jenny Cooper  Procedure(s) Performed: Procedure(s) (LRB): CARDIOVERSION (N/A)  Patient location during evaluation: PACU Anesthesia Type: General Level of consciousness: awake and alert and oriented Pain management: pain level controlled Vital Signs Assessment: post-procedure vital signs reviewed and stable Respiratory status: spontaneous breathing Cardiovascular status: blood pressure returned to baseline Anesthetic complications: no     Last Vitals:  Vitals:   04/08/17 0859 04/08/17 1125  BP: 102/63   Pulse: 60 61  Resp: (!) 21   Temp: 36.5 C   SpO2: 94% 100%    Last Pain:  Vitals:   04/08/17 0917  TempSrc:   PainSc: 0-No pain                 Kani Chauvin

## 2017-04-08 NOTE — Care Management Important Message (Signed)
Important Message  Patient Details  Name: Jenny Cooper MRN: [id number] Date of Birth: [birth year]   Medicare Important Message Given:  N/A - LOS <3 / Initial given by admissions    Katrina Stack, RN 04/08/2017, 9:47 AM

## 2017-04-08 NOTE — Care Management (Signed)
Successful cardioversion today.  Anticipate discharge.  Declines home health. Requested exertional room air sat prior to discharge and communicate results to CM.  If there is no need to set up home 02, there will be no further needs for CM

## 2017-04-08 NOTE — Progress Notes (Signed)
Spoke with dr. Fletcher Anon regarding am dose of amiodarone. Made md aware 200mg  dose was not given prior to cardioversion. Per dr Fletcher Anon okay to give dose now

## 2017-04-08 NOTE — Anesthesia Preprocedure Evaluation (Addendum)
Anesthesia Evaluation  Patient identified by MRN, date of birth, ID band Patient awake    Reviewed: Allergy & Precautions, NPO status , Patient's Chart, lab work & pertinent test results, reviewed documented beta blocker date and time   Airway Mallampati: III  TM Distance: <3 FB     Dental  (+) Chipped   Pulmonary COPD, former smoker,           Cardiovascular hypertension, Pt. on medications and Pt. on home beta blockers + CAD, + Past MI, + CABG and +CHF  + dysrhythmias Atrial Fibrillation + pacemaker      Neuro/Psych  Neuromuscular disease negative psych ROS   GI/Hepatic GERD  ,  Endo/Other  diabetes, Type 2Hypothyroidism   Renal/GU      Musculoskeletal   Abdominal   Peds  Hematology   Anesthesia Other Findings Chronically low O2 saturations in the 91% range. Lung ca. CHF with EF  35%. Mitral valve replaced. Hx of AF with ablation. Hx of SVT.  Reproductive/Obstetrics                            Anesthesia Physical  Anesthesia Plan  ASA: III  Anesthesia Plan: General   Post-op Pain Management:    Induction: Intravenous  PONV Risk Score and Plan:   Airway Management Planned: Nasal Cannula  Additional Equipment:   Intra-op Plan:   Post-operative Plan:   Informed Consent: I have reviewed the patients History and Physical, chart, labs and discussed the procedure including the risks, benefits and alternatives for the proposed anesthesia with the patient or authorized representative who has indicated his/her understanding and acceptance.   Dental advisory given  Plan Discussed with: CRNA and Surgeon  Anesthesia Plan Comments:         Anesthesia Quick Evaluation

## 2017-04-08 NOTE — Progress Notes (Signed)
SATURATION QUALIFICATIONS: (This note is used to comply with regulatory documentation for home oxygen)  Patient Saturations on Room Air at Rest =90%  Patient Saturations on Room Air while Ambulating = 86%  Patient Saturations on 2 Liters of oxygen while Ambulating = 92%  Please briefly explain why patient needs home oxygen:

## 2017-04-08 NOTE — Progress Notes (Signed)
TEXT PAGED DR Bridgett Larsson TO MAKE AWARE LACTIC ACID IS 2.4

## 2017-04-08 NOTE — Progress Notes (Signed)
Dr. Bridgett Larsson returned paged no new orders regarding lactic acid of 2.4

## 2017-04-08 NOTE — Anesthesia Procedure Notes (Signed)
Performed by: Rawlins Stuard Pre-anesthesia Checklist: Patient identified, Emergency Drugs available, Suction available, Patient being monitored and Timeout performed Patient Re-evaluated:Patient Re-evaluated prior to induction Oxygen Delivery Method: Nasal cannula Preoxygenation: Pre-oxygenation with 100% oxygen Induction Type: IV induction       

## 2017-04-08 NOTE — Care Management (Signed)
Did  qualify for continuous oxygen. Informed patient and agreeable to have CM call [facility] as this agency provides patient's nocturnal oxygen.   Notified [facility] and portable tank brought to patient's room

## 2017-04-08 NOTE — Transfer of Care (Signed)
Immediate Anesthesia Transfer of Care Note  Patient: Jenny Cooper  Procedure(s) Performed: Procedure(s): CARDIOVERSION (N/A)  Patient Location: PACU  Anesthesia Type:General  Level of Consciousness: sedated and responds to stimulation  Airway & Oxygen Therapy: Patient Spontanous Breathing and Patient connected to nasal cannula oxygen  Post-op Assessment: Report given to RN and Post -op Vital signs reviewed and stable  Post vital signs: Reviewed and stable  Last Vitals:  Vitals:   04/08/17 0812 04/08/17 0813  BP: 113/74 113/74  Pulse: 80 80  Resp: 19 18  Temp:    SpO2: 91% 94%    Last Pain:  Vitals:   04/08/17 0703  TempSrc: Oral  PainSc:          Complications: No apparent anesthesia complications

## 2017-04-08 NOTE — CV Procedure (Signed)
Cardioversion note: A standard informed consent was obtained. Timeout was performed. The pads were placed in the anterior posterior fashion. The patient was given propofol by the anesthesia team.  Successful cardioversion was performed with a 100 J. The patient converted to sinus v- paced rhythm. Pre-and post EKGs were reviewed. The patient tolerated the procedure with no immediate complications.  Recommendations: Continue same medications . Interrogate pacemaker.

## 2017-04-08 NOTE — Discharge Instructions (Addendum)
Heart Failure Clinic appointment on April 14 2017 at 11:40am with Darylene Price, [place]. Please call [phone] to reschedule.   Home O2  2L.  Information on my medicine - ELIQUIS (apixaban)  This medication education was reviewed with me or my healthcare representative as part of my discharge preparation.  The pharmacist that spoke with me during my hospital stay was:  Ramond Dial, [facility]  Why was Eliquis prescribed for you? Eliquis was prescribed for you to reduce the risk of a blood clot forming that can cause a stroke if you have a medical condition called atrial fibrillation (a type of irregular heartbeat).  What do You need to know about Eliquis ? Take your Eliquis TWICE DAILY - one tablet in the morning and one tablet in the evening with or without food. If you have difficulty swallowing the tablet whole please discuss with your pharmacist how to take the medication safely.  Take Eliquis exactly as prescribed by your doctor and DO NOT stop taking Eliquis without talking to the doctor who prescribed the medication.  Stopping may increase your risk of developing a stroke.  Refill your prescription before you run out.  After discharge, you should have regular check-up appointments with your healthcare provider that is prescribing your Eliquis.  In the future your dose may need to be changed if your kidney function or weight changes by a significant amount or as you get older.  What do you do if you miss a dose? If you miss a dose, take it as soon as you remember on the same day and resume taking twice daily.  Do not take more than one dose of ELIQUIS at the same time to make up a missed dose.  Important Safety Information A possible side effect of Eliquis is bleeding. You should call your healthcare provider right away if you experience any of the following: ? Bleeding from an injury or your nose that does not stop. ? Unusual colored urine (red or dark brown) or unusual  colored stools (red or black). ? Unusual bruising for unknown reasons. ? A serious fall or if you hit your head (even if there is no bleeding).  Some medicines may interact with Eliquis and might increase your risk of bleeding or clotting while on Eliquis. To help avoid this, consult your healthcare provider or pharmacist prior to using any new prescription or non-prescription medications, including herbals, vitamins, non-steroidal anti-inflammatory drugs (NSAIDs) and supplements.  This website has more information on Eliquis (apixaban): http://www.eliquis.com/eliquis/home

## 2017-04-08 NOTE — Progress Notes (Signed)
Discharge instructions along with home medication list and follow up gone over with patient and family. Patient and family verbalized that they understood instructions.pacemaker interrogated. Personal 02 tank delivered and at bedside. Patient to be discharged home on 2l continuous. Iv removed x2, telemetry removed nsr prior to removal. One printed prescription for lasix given to patient. No distres noted.

## 2017-04-08 NOTE — Consult Note (Signed)
Followed up with patient today.  One of patient's daughters at bedside. Patient sitting up on the side of bed talking with daughter. Patient stated she is feeling better since having had the cardioversion procedure this morning.  Patient informed me she is very interested in Cardiac Rehab and would like for me to obtain order from MD.  Patient and daughter understand patient has to be out of the hospital and stable on home medications for heart failure for six weeks before starting the Cardiac Rehab program.  Will plan on calling the patient in 4 weeks to set up Orientation to Cardiac Rehab appointment.  Patient and daughter are in agreement with plan.  Patient for possible discharge today.  Order entered for Cardiac Rehab per protocol and awaiting co-signature by Dr. Rockey Situ.

## 2017-04-08 NOTE — Discharge Summary (Signed)
[place] [facility] [place] NAME: Jenny Cooper    MR#:  [id number]  DATE OF BIRTH:  08-Jul-1936  DATE OF ADMISSION:  04/06/2017   ADMITTING PHYSICIAN: Demetrios Loll, MD  DATE OF DISCHARGE: 04/08/2017 PRIMARY CARE PHYSICIAN: Glean Hess, MD   ADMISSION DIAGNOSIS:  Acute respiratory distress [R06.03] Acute pulmonary edema (HCC) [J81.0] COPD exacerbation (HCC) [W09.8] Chronic systolic CHF (congestive heart failure) (HCC) [I50.22] Acute on chronic respiratory failure with hypoxia (HCC) [J96.21] Chronic obstructive pulmonary disease, unspecified COPD type ([place]) [J44.9] DISCHARGE DIAGNOSIS:  Principal Problem:   Acute on chronic respiratory failure with hypoxia (HCC) Active Problems:   Pacemaker   Coronary artery disease involving coronary bypass graft of native heart without angina pectoris   Acute on chronic systolic heart failure (HCC)   Chronic obstructive pulmonary disease (HCC)   Atrial flutter (HCC)   Lung cancer (HCC)   PAF (paroxysmal atrial fibrillation) (HCC)   Sinus node dysfunction ([place])  SECONDARY DIAGNOSIS:   Past Medical History:  Diagnosis Date  . Atypical atrial flutter ([place])    a. s/p ablation 07/27/2013 followed by Dr. Rockey Situ  . CAD (coronary artery disease)    a. s/p MI x 2 in 2002 s/p PCI x 2 in 2002; b. s/p 2v CABG 2002; c. stress echo 07/2004 w/ evi of pos & inf infarct & no evi of ischemia; d. 4/08 dipyridamole scan w/ multiple areas of infarct, no ischemia, EF 49%; e. cath 04/28/15 3v CAD, med Rx rec, no targets for revasc, LM lum irregs, pLAD 30%, 100%, ost-pLCx 60%, [name] 99%, OM2 100%, p-mRCA 90%, [name] 100% L-R collats, VG-mLAD irregs, VG-OM2 oc  . Carcinoma of right lung ([place]) 01/03/2015   a. followed by Dr. Oliva Bustard  . Chronic systolic CHF (congestive heart failure) ([place])    a. echo 03/2015: EF 30-35%, sev ant/inf/pos HK, in mild to mod MR  . Complication of anesthesia    more recently patient oxygen levels do  not rebound as quickly  . Compressed spine fracture ([place]) 08/06/2016   lumbar 2, t11, t12  . COPD (chronic obstructive pulmonary disease) ([place])   . Fractured pelvis ([place]) 05/26/2016   2 places  . GERD (gastroesophageal reflux disease)   . History of blood clots    12/2001  . History of colonoscopy 2013  . History of mammography, screening 2015  . History of Papanicolaou smear of cervix 2013  . HLD (hyperlipidemia)   . HTN (hypertension)   . Hypothyroidism   . Lung cancer ([place])   . Mitral regurgitation    a. s/p mitral ring placement 09/2000; b. echo 09/2010: EF 50%, inf HK, post AK, mild MR, prosthetic mitral valve ring w/ peak gradient of 10 mmHg; b. echo 2/13: EF 50%, mild MR/TR     . Myocardial infarction ([place])    X 2 (LAST ONE IN 2002)  . Neuropathy   . Pacemaker    a. MDT 2002; b. generator replacement 2013; c. followed by Dr. Omelia Blackwater, MD  . PAF (paroxysmal atrial fibrillation) ([place])    a. on Eliquis   . Personal history of chemotherapy   . Personal history of radiation therapy    HOSPITAL COURSE:   81 year old female with history of chronic systolic heart failure, CAD status post 2 vessel CABG, atrial flutter/fibrillation who presented with shortness of breath.  1. Acute on chronic hypoxic respiratory failure in the setting of tachycardia mediated cardiomyopathy, pulmonary edema Patient was weaned off  BiPAP and on nasal cannula. She wears 2 L of oxygen at night, now she needs O2 [place] 2L at home.  2. Acute on chronic systolic congestive heart failure in the setting of tachycardia mediated cardiomyopathy Echocardiogram: LV EF: 30% -   35%. She is on IV Lasix 40 mg twice a day. Changed to by mouth 40 mg twice a day for 7 days then cardiologist will adjust dose as outpatient per cardiologist. Continue Entresto  3. Atrial flutter/fibrillation: s/p cardioversion this am. Back to normal sinus rhythm. Continue amiodarone, metoprolol and Eliquis  4. Hypothyroid: Continue  Synthroid  5. Acute COPD exacerbation: Wean steroids to home prednisone. Continue nebs  6. CAD: Continue atorvastatin and metoprolol  7. Hypokalemia: Improved  8. Elevated blood sugars from steroids A1c in April was 5.3  DISCHARGE CONDITIONS:  Stable, discharge to home today. CONSULTS OBTAINED:  Treatment Team:  Nelva Bush, MD Minna Merritts, MD DRUG ALLERGIES:   Allergies  Allergen Reactions  . Lovenox [Enoxaparin Sodium] Itching  . Meperidine Other (See Comments)    Other Reaction: pt does not like how it makes her feel   DISCHARGE MEDICATIONS:   Allergies as of 04/08/2017      Reactions   Lovenox [enoxaparin Sodium] Itching   Meperidine Other (See Comments)   Other Reaction: pt does not like how it makes her feel      Medication List    TAKE these medications   acetaminophen 500 MG tablet Commonly known as:  TYLENOL Take 1,000 mg by mouth every 6 (six) hours.   amiodarone 200 MG tablet Commonly known as:  PACERONE Take 1 tablet (200 mg total) by mouth as directed. Take 2 tablets by mouth twice daily for 5 days, then 1 tablet by mouth twice daily What changed:  when to take this  additional instructions   apixaban 5 MG Tabs tablet Commonly known as:  ELIQUIS Take 5 mg by mouth 2 (two) times daily.   atorvastatin 20 MG tablet Commonly known as:  LIPITOR TAKE 1 TABLET (20 MG TOTAL) BY MOUTH AT BEDTIME.   CALCIUM 600-D PO Take 2 tablets by mouth every morning.   Cetirizine HCl 10 MG Caps Take 1 capsule (10 mg total) by mouth 1 day or 1 dose. What changed:  when to take this   furosemide 40 MG tablet Commonly known as:  LASIX Take 1 tablet (40 mg total) by mouth 2 (two) times daily. What changed:  medication strength  how much to take  when to take this  reasons to take this   levothyroxine 75 MCG tablet Commonly known as:  SYNTHROID, LEVOTHROID TAKE 1 TABLET (75 MCG TOTAL) BY MOUTH DAILY BEFORE BREAKFAST.   metoprolol  succinate 25 MG 24 hr tablet Commonly known as:  TOPROL-XL TAKE 1 TABLET BY MOUTH EVERY MORNING AND 2 TABS IN THE EVENING   omeprazole 20 MG capsule Commonly known as:  PRILOSEC TAKE 1 CAPSULE (20 MG TOTAL) BY MOUTH EVERY MORNING.   predniSONE 10 MG tablet Commonly known as:  DELTASONE Take 1 tablet (10 mg total) by mouth daily with breakfast.   sacubitril-valsartan 24-26 MG Commonly known as:  ENTRESTO Take 1 tablet by mouth 2 (two) times daily.   senna 8.6 MG Tabs tablet Commonly known as:  SENOKOT Take 1 tablet (8.6 mg total) by mouth daily.   umeclidinium bromide 62.5 MCG/INH Aepb Commonly known as:  INCRUSE ELLIPTA Inhale 1 puff into the lungs daily.  Durable Medical Equipment        Start     Ordered   04/08/17 1142  For home use only DME oxygen  Once    Question Answer Comment  Mode or (Route) Nasal cannula   Liters per Minute 2   Frequency Continuous (stationary and portable oxygen unit needed)   Oxygen conserving device Yes   Oxygen delivery system Gas      04/08/17 1141       DISCHARGE INSTRUCTIONS:  See AVS.  If you experience worsening of your admission symptoms, develop shortness of breath, life threatening emergency, suicidal or homicidal thoughts you must seek medical attention immediately by calling 911 or calling your MD immediately  if symptoms less severe.  You Must read complete instructions/literature along with all the possible adverse reactions/side effects for all the Medicines you take and that have been prescribed to you. Take any new Medicines after you have completely understood and accpet all the possible adverse reactions/side effects.   Please note  You were cared for by a hospitalist during your hospital stay. If you have any questions about your discharge medications or the care you received while you were in the hospital after you are discharged, you can call the unit and asked to speak with the hospitalist on call if  the hospitalist that took care of you is not available. Once you are discharged, your primary care physician will handle any further medical issues. Please note that NO REFILLS for any discharge medications will be authorized once you are discharged, as it is imperative that you return to your primary care physician (or establish a relationship with a primary care physician if you do not have one) for your aftercare needs so that they can reassess your need for medications and monitor your lab values.    On the day of Discharge:  VITAL SIGNS:  Blood pressure 102/63, pulse 61, temperature 97.7 F (36.5 C), temperature source Oral, resp. rate (!) 21, height 5\' 6"  (1.676 m), weight 165 lb (74.8 kg), SpO2 92 %. PHYSICAL EXAMINATION:  GENERAL:  81 y.o.-year-old patient lying in the bed with no acute distress.  EYES: Pupils equal, round, reactive to light and accommodation. No scleral icterus. Extraocular muscles intact.  HEENT: Head atraumatic, normocephalic. Oropharynx and nasopharynx clear.  NECK:  Supple, no jugular venous distention. No thyroid enlargement, no tenderness.  LUNGS: Normal breath sounds bilaterally, no wheezing, rales,rhonchi or crepitation. No use of accessory muscles of respiration.  CARDIOVASCULAR: S1, S2 normal. No murmurs, rubs, or gallops.  ABDOMEN: Soft, non-tender, non-distended. Bowel sounds present. No organomegaly or mass.  EXTREMITIES: No pedal edema, cyanosis, or clubbing.  NEUROLOGIC: Cranial nerves II through XII are intact. Muscle strength 5/5 in all extremities. Sensation intact. Gait not checked.  PSYCHIATRIC: The patient is alert and oriented x 3.  SKIN: No obvious rash, lesion, or ulcer.  DATA REVIEW:   CBC  Recent Labs Lab 04/07/17 0539  WBC 5.7  HGB 12.3  HCT 36.6  PLT 209    Chemistries   Recent Labs Lab 04/06/17 1058  04/08/17 0601  NA  --   < > 141  K  --   < > 4.2  CL  --   < > 100*  CO2  --   < > 31  GLUCOSE  --   < > 148*  BUN  --    < > 29*  CREATININE  --   < > 0.98  CALCIUM  --   < >  9.1  MG 1.8  --   --   < > = values in this interval not displayed.   Microbiology Results  Results for orders placed or performed during the hospital encounter of 04/06/17  Blood culture (routine x 2)     Status: None (Preliminary result)   Collection Time: 04/06/17 10:58 AM  Result Value Ref Range Status   Specimen Description BLOOD RIGHT ANTECUBITAL  Final   Special Requests   Final    BOTTLES DRAWN AEROBIC AND ANAEROBIC Blood Culture adequate volume   Culture NO GROWTH 2 DAYS  Final   Report Status PENDING  Incomplete  Blood culture (routine x 2)     Status: None (Preliminary result)   Collection Time: 04/06/17 11:08 AM  Result Value Ref Range Status   Specimen Description BLOOD BLOOD LEFT FOREARM  Final   Special Requests   Final    BOTTLES DRAWN AEROBIC AND ANAEROBIC Blood Culture results may not be optimal due to an inadequate volume of blood received in culture bottles   Culture NO GROWTH 2 DAYS  Final   Report Status PENDING  Incomplete  MRSA PCR Screening     Status: None   Collection Time: 04/06/17  2:40 PM  Result Value Ref Range Status   MRSA by PCR NEGATIVE NEGATIVE Final    Comment:        The GeneXpert MRSA Assay (FDA approved for NASAL specimens only), is one component of a comprehensive MRSA colonization surveillance program. It is not intended to diagnose MRSA infection nor to guide or monitor treatment for MRSA infections.     RADIOLOGY:  No results found.   Management plans discussed with the patient, family and they are in agreement.  CODE STATUS: Full Code   TOTAL TIME TAKING CARE OF THIS PATIENT: 35 minutes.    Demetrios Loll M.D on 04/08/2017 at 2:20 PM  Between 7am to 6pm - Pager - [phone]  After 6pm go to [email or web address] - Proofreader  [employer] [facility]  Hospitalists  Office  [phone]  CC: Primary care physician; Glean Hess, MD   Note: This dictation  was prepared with Dragon dictation along with smaller phrase technology. Any transcriptional errors that result from this process are unintentional.

## 2017-04-08 NOTE — Anesthesia Post-op Follow-up Note (Signed)
Anesthesia QCDR form completed.        

## 2017-04-08 NOTE — Progress Notes (Signed)
Spoke with dr. Bridgett Larsson regarding bp of 102/63 and scheduled entresto and metoprolol per md hold dose this am is blood pressure increases can give later today

## 2017-04-11 ENCOUNTER — Encounter: Payer: Self-pay | Admitting: Cardiovascular Disease

## 2017-04-11 LAB — CULTURE, BLOOD (ROUTINE X 2)
CULTURE: NO GROWTH
Culture: NO GROWTH
SPECIAL REQUESTS: ADEQUATE

## 2017-04-13 LAB — BLOOD GAS, VENOUS
Acid-Base Excess: 7.3 mmol/L — ABNORMAL HIGH (ref 0.0–2.0)
BICARBONATE: 36.1 mmol/L — AB (ref 20.0–28.0)
PH VEN: 7.32 (ref 7.250–7.430)
Patient temperature: 37
pCO2, Ven: 70 mmHg — ABNORMAL HIGH (ref 44.0–60.0)
pO2, Ven: UNDETERMINED mmHg (ref 32.0–45.0)

## 2017-04-14 ENCOUNTER — Encounter: Payer: Self-pay | Admitting: Family

## 2017-04-14 ENCOUNTER — Ambulatory Visit: Payer: Medicare Other | Attending: Family | Admitting: Family

## 2017-04-14 VITALS — BP 100/81 | HR 74 | Resp 18 | Ht 66.0 in | Wt 159.2 lb

## 2017-04-14 DIAGNOSIS — Z90711 Acquired absence of uterus with remaining cervical stump: Secondary | ICD-10-CM | POA: Insufficient documentation

## 2017-04-14 DIAGNOSIS — I25810 Atherosclerosis of coronary artery bypass graft(s) without angina pectoris: Secondary | ICD-10-CM | POA: Diagnosis not present

## 2017-04-14 DIAGNOSIS — Z923 Personal history of irradiation: Secondary | ICD-10-CM | POA: Diagnosis not present

## 2017-04-14 DIAGNOSIS — I5042 Chronic combined systolic (congestive) and diastolic (congestive) heart failure: Secondary | ICD-10-CM | POA: Insufficient documentation

## 2017-04-14 DIAGNOSIS — E039 Hypothyroidism, unspecified: Secondary | ICD-10-CM | POA: Diagnosis not present

## 2017-04-14 DIAGNOSIS — E785 Hyperlipidemia, unspecified: Secondary | ICD-10-CM | POA: Insufficient documentation

## 2017-04-14 DIAGNOSIS — Z888 Allergy status to other drugs, medicaments and biological substances status: Secondary | ICD-10-CM | POA: Diagnosis not present

## 2017-04-14 DIAGNOSIS — Z7902 Long term (current) use of antithrombotics/antiplatelets: Secondary | ICD-10-CM | POA: Insufficient documentation

## 2017-04-14 DIAGNOSIS — Z86718 Personal history of other venous thrombosis and embolism: Secondary | ICD-10-CM | POA: Diagnosis not present

## 2017-04-14 DIAGNOSIS — J449 Chronic obstructive pulmonary disease, unspecified: Secondary | ICD-10-CM | POA: Diagnosis not present

## 2017-04-14 DIAGNOSIS — Z952 Presence of prosthetic heart valve: Secondary | ICD-10-CM | POA: Diagnosis not present

## 2017-04-14 DIAGNOSIS — I484 Atypical atrial flutter: Secondary | ICD-10-CM | POA: Diagnosis not present

## 2017-04-14 DIAGNOSIS — I252 Old myocardial infarction: Secondary | ICD-10-CM | POA: Insufficient documentation

## 2017-04-14 DIAGNOSIS — G629 Polyneuropathy, unspecified: Secondary | ICD-10-CM | POA: Insufficient documentation

## 2017-04-14 DIAGNOSIS — Z9221 Personal history of antineoplastic chemotherapy: Secondary | ICD-10-CM | POA: Diagnosis not present

## 2017-04-14 DIAGNOSIS — I110 Hypertensive heart disease with heart failure: Secondary | ICD-10-CM | POA: Insufficient documentation

## 2017-04-14 DIAGNOSIS — Z85118 Personal history of other malignant neoplasm of bronchus and lung: Secondary | ICD-10-CM | POA: Insufficient documentation

## 2017-04-14 DIAGNOSIS — Z9889 Other specified postprocedural states: Secondary | ICD-10-CM | POA: Insufficient documentation

## 2017-04-14 DIAGNOSIS — I480 Paroxysmal atrial fibrillation: Secondary | ICD-10-CM | POA: Insufficient documentation

## 2017-04-14 DIAGNOSIS — K219 Gastro-esophageal reflux disease without esophagitis: Secondary | ICD-10-CM | POA: Insufficient documentation

## 2017-04-14 DIAGNOSIS — Z951 Presence of aortocoronary bypass graft: Secondary | ICD-10-CM | POA: Diagnosis not present

## 2017-04-14 DIAGNOSIS — Z955 Presence of coronary angioplasty implant and graft: Secondary | ICD-10-CM | POA: Diagnosis not present

## 2017-04-14 DIAGNOSIS — I5022 Chronic systolic (congestive) heart failure: Secondary | ICD-10-CM | POA: Insufficient documentation

## 2017-04-14 DIAGNOSIS — Z8249 Family history of ischemic heart disease and other diseases of the circulatory system: Secondary | ICD-10-CM | POA: Insufficient documentation

## 2017-04-14 DIAGNOSIS — Z950 Presence of cardiac pacemaker: Secondary | ICD-10-CM | POA: Insufficient documentation

## 2017-04-14 DIAGNOSIS — Z87891 Personal history of nicotine dependence: Secondary | ICD-10-CM | POA: Diagnosis not present

## 2017-04-14 DIAGNOSIS — I10 Essential (primary) hypertension: Secondary | ICD-10-CM

## 2017-04-14 DIAGNOSIS — Z87828 Personal history of other (healed) physical injury and trauma: Secondary | ICD-10-CM | POA: Insufficient documentation

## 2017-04-14 DIAGNOSIS — Z836 Family history of other diseases of the respiratory system: Secondary | ICD-10-CM | POA: Insufficient documentation

## 2017-04-14 DIAGNOSIS — Z823 Family history of stroke: Secondary | ICD-10-CM | POA: Insufficient documentation

## 2017-04-14 DIAGNOSIS — Z800 Family history of malignant neoplasm of digestive organs: Secondary | ICD-10-CM | POA: Insufficient documentation

## 2017-04-14 NOTE — Patient Instructions (Signed)
Continue weighing daily and call for an overnight weight gain of > 2 pounds or a weekly weight gain of >5 pounds. 

## 2017-04-14 NOTE — Progress Notes (Signed)
Patient ID: Jenny Cooper, female    DOB: Sep 08, 1935, 81 y.o.   MRN: [id number]  HPI  Jenny Cooper is a 81 y/o female with a history of atrial fibrillation (cardioversion), MI, lung cancer, hypothyroidism, HTN, hyperlipidemia, blood clots, GERD, COPD, spinal compression, previous tobacco use and chronic heart failure.   Echo done 04/06/17 reviewed and shows an EF of 30-35% along with trivial AR, moderate MR and moderately elevated PA pressure of 50 mm Hg.  Cardiac catheterization done 04/28/15 shows severe underlying three-vessel CAD with occluded SVG likely to OM. No good options for revascularization. Native RCA is occluded with left-to-right collaterals. Recommended optimizing medical therapy.   Admitted 04/06/17 due to pulmonary edema. Initially needed bipap and then transitioned to nasal cannula. Given IV diuretics initially and then transitioned to oral diuretics. Cardiology consult obtained. Cardioverted and maintained in NSR. Discharged home after 2 days.   She presents today for her initial visit with a chief complaint of minimal shortness of breath upon moderate exertion. She describes this as chronic in nature having been present for several months with improvement since hospital discharge. Denies fatigue, chest pain, edema or weight gain.  Past Medical History:  Diagnosis Date  . Atypical atrial flutter ([place])    a. s/p ablation 07/27/2013 followed by Dr. Rockey Cooper  . CAD (coronary artery disease)    a. s/p MI x 2 in 2002 s/p PCI x 2 in 2002; b. s/p 2v CABG 2002; c. stress echo 07/2004 w/ evi of pos & inf infarct & no evi of ischemia; d. 4/08 dipyridamole scan w/ multiple areas of infarct, no ischemia, EF 49%; e. cath 04/28/15 3v CAD, med Rx rec, no targets for revasc, LM lum irregs, pLAD 30%, 100%, ost-pLCx 60%, [name] 99%, OM2 100%, p-mRCA 90%, [name] 100% L-R collats, VG-mLAD irregs, VG-OM2 oc  . Carcinoma of right lung ([place]) 01/03/2015   a. followed by Dr. Oliva Bustard  . Chronic systolic CHF  (congestive heart failure) ([place])    a. echo 03/2015: EF 30-35%, sev ant/inf/pos HK, in mild to mod MR  . Complication of anesthesia    more recently patient oxygen levels do not rebound as quickly  . Compressed spine fracture ([place]) 08/06/2016   lumbar 2, t11, t12  . COPD (chronic obstructive pulmonary disease) ([place])   . Fractured pelvis ([place]) 05/26/2016   2 places  . GERD (gastroesophageal reflux disease)   . History of blood clots    12/2001  . History of colonoscopy 2013  . History of mammography, screening 2015  . History of Papanicolaou smear of cervix 2013  . HLD (hyperlipidemia)   . HTN (hypertension)   . Hypothyroidism   . Lung cancer ([place])   . Mitral regurgitation    a. s/p mitral ring placement 09/2000; b. echo 09/2010: EF 50%, inf HK, post AK, mild MR, prosthetic mitral valve ring w/ peak gradient of 10 mmHg; b. echo 2/13: EF 50%, mild MR/TR     . Myocardial infarction ([place])    X 2 (LAST ONE IN 2002)  . Neuropathy   . Pacemaker    a. MDT 2002; b. generator replacement 2013; c. followed by Dr. Omelia Blackwater, MD  . PAF (paroxysmal atrial fibrillation) ([place])    a. on Eliquis   . Personal history of chemotherapy   . Personal history of radiation therapy    Past Surgical History:  Procedure Laterality Date  . ABLATION  04/2016   [facility]  . APPENDECTOMY    . CARDIAC CATHETERIZATION  N/A 04/28/2015   Procedure: Left Heart Cath and Coronary Angiography;  Surgeon: Wellington Hampshire, MD;  Location: [place] [facility];  Service: Cardiovascular;  Laterality: N/A;  . CARDIOVERSION N/A 04/08/2017   Procedure: CARDIOVERSION;  Surgeon: Wellington Hampshire, MD;  Location: [facility] ORS;  Service: Cardiovascular;  Laterality: N/A;  . COLONOSCOPY  12/2009   2 small tubular adenomas  . CORONARY ARTERY BYPASS GRAFT  09/2000  . ECTOPIC PREGNANCY SURGERY    . ELECTROMAGNETIC NAVIGATION BROCHOSCOPY N/A 10/06/2015   Procedure: ELECTROMAGNETIC NAVIGATION BRONCHOSCOPY;  Surgeon: Flora Lipps, MD;  Location: [facility]  ORS;  Service: Cardiopulmonary;  Laterality: N/A;  . ENDOBRONCHIAL ULTRASOUND N/A 10/06/2015   Procedure: ENDOBRONCHIAL ULTRASOUND;  Surgeon: Flora Lipps, MD;  Location: [facility] ORS;  Service: Cardiopulmonary;  Laterality: N/A;  . KYPHOPLASTY N/A 09/28/2016   Procedure: KYPHOPLASTY;  Surgeon: Hessie Knows, MD;  Location: [facility] ORS;  Service: Orthopedics;  Laterality: N/A;  . PACEMAKER INSERTION  03/2012  . thorocentesis  12/24/2016  . VAGINAL HYSTERECTOMY     partial - left ovary remains   Family History  Problem Relation Age of Onset  . COPD Mother        sister, and brother  . Stroke Maternal Grandmother   . Hypertension Sister   . Hypertension Brother   . Colon cancer Brother        age 92  . Heart attack Neg Hx   . Breast cancer Neg Hx    Social History  Substance Use Topics  . Smoking status: Former Smoker    Packs/day: 2.00    Years: 42.00    Types: Cigarettes    Quit date: 08/30/2000  . Smokeless tobacco: Never Used     Comment: quit smoking in 08/28/2000  . Alcohol use 6.0 oz/week    10 Glasses of wine per week     Comment: 2 glasses of wine per day   Allergies  Allergen Reactions  . Lovenox [Enoxaparin Sodium] Itching  . Meperidine Other (See Comments)    Other Reaction: pt does not like how it makes her feel   Prior to Admission medications   Medication Sig Start Date End Date Taking? Authorizing Provider  acetaminophen (TYLENOL) 500 MG tablet Take 1,000 mg by mouth every 6 (six) hours.    Yes [provider]  amiodarone (PACERONE) 200 MG tablet Take 200 mg by mouth 2 (two) times daily.   Yes [provider]  apixaban (ELIQUIS) 5 MG TABS tablet Take 5 mg by mouth 2 (two) times daily.  08/04/16  Yes [provider]  atorvastatin (LIPITOR) 20 MG tablet TAKE 1 TABLET (20 MG TOTAL) BY MOUTH AT BEDTIME. 10/26/16  Yes Glean Hess, MD  Calcium Carb-Cholecalciferol (CALCIUM 600-D PO) Take 1 tablet by mouth every morning.    Yes [provider]  Cetirizine HCl 10 MG CAPS Take 1 capsule (10 mg total) by mouth 1 day or 1 dose. Patient taking differently: Take 1 capsule by mouth daily.  05/19/15  Yes Dunn, Areta Haber, PA-C  furosemide (LASIX) 40 MG tablet Take 1 tablet (40 mg total) by mouth 2 (two) times daily. 04/08/17  Yes Demetrios Loll, MD  levothyroxine (SYNTHROID, LEVOTHROID) 75 MCG tablet TAKE 1 TABLET (75 MCG TOTAL) BY MOUTH DAILY BEFORE BREAKFAST. 11/22/16  Yes Glean Hess, MD  metoprolol succinate (TOPROL-XL) 25 MG 24 hr tablet TAKE 1 TABLET BY MOUTH EVERY MORNING AND 2 TABS IN THE EVENING 03/21/17  Yes Gollan, Kathlene November, MD  predniSONE (DELTASONE) 10 MG tablet Take 1 tablet (10 mg total) by mouth daily with breakfast. 04/01/17  Yes Kasa, Kurian, MD  sacubitril-valsartan (ENTRESTO) 24-26 MG Take 1 tablet by mouth 2 (two) times daily. 01/10/17  Yes Minna Merritts, MD  senna (SENOKOT) 8.6 MG TABS tablet Take 1 tablet (8.6 mg total) by mouth daily. 05/27/16  Yes Veronese, [place], MD  umeclidinium bromide (INCRUSE ELLIPTA) 62.5 MCG/INH AEPB Inhale 1 puff into the lungs daily. 02/09/17  Yes Flora Lipps, MD    Review of Systems  Constitutional: Negative for appetite change and fatigue.  HENT: Positive for rhinorrhea. Negative for congestion and sore throat.   Eyes: Negative.   Respiratory: Positive for shortness of breath. Negative for cough and chest tightness.   Cardiovascular: Negative for chest pain, palpitations and leg swelling.  Gastrointestinal: Negative for abdominal distention and abdominal pain.  Endocrine: Negative.   Genitourinary: Negative.   Musculoskeletal: Negative for back pain and neck pain.  Skin: Negative.   Allergic/Immunologic: Negative.   Neurological: Negative for dizziness and light-headedness.  Hematological: Negative for adenopathy. Does not bruise/bleed easily.  Psychiatric/Behavioral: Negative for dysphoric mood and sleep disturbance (sleeping on 1 pillow with oxygen on ). The patient is  not nervous/anxious.    Vitals:   04/14/17 1159  BP: 100/81  Pulse: 74  Resp: 18  SpO2: 90%  Weight: 159 lb 4 oz (72.2 kg)  Height: 5\' 6"  (1.676 m)   Wt Readings from Last 3 Encounters:  04/14/17 159 lb 4 oz (72.2 kg)  04/08/17 165 lb (74.8 kg)  03/15/17 163 lb 14.6 oz (74.3 kg)   Lab Results  Component Value Date   CREATININE 0.98 04/08/2017   CREATININE 1.16 (H) 04/07/2017   CREATININE 1.30 (H) 04/06/2017   Physical Exam  Constitutional: She is oriented to person, place, and time. She appears well-developed and well-nourished.  HENT:  Head: Normocephalic and atraumatic.  Neck: Normal range of motion. Neck supple. No JVD present.  Cardiovascular: Normal rate and regular rhythm.   Pulmonary/Chest: Effort normal. She has no wheezes. She has no rales.  Abdominal: Soft. She exhibits no distension. There is no tenderness.  Musculoskeletal: She exhibits no edema or tenderness.  Neurological: She is alert and oriented to person, place, and time.  Skin: Skin is warm and dry.  Psychiatric: She has a normal mood and affect. Her behavior is normal.  Nursing note and vitals reviewed.  Assessment & Plan:   1: Chronic heart failure with reduced ejection fraction- - NYHA class II - euvolemic - weighing daily and she was instructed to call for an overnight weight gain of >2 pounds or a weekly weight gain of >5 pounds - not adding salt except to eggs, tomato or watermelon and she says that its "just a sprinkle". Discussed the importance of not adding any salt so that she can follow a 2000mg  sodium diet. Patient is already reading food labels for sodium content.  - is planning on participating in cardiac rehab once it's been long enough after her cardioversion; not interested in home PT at this time - may be difficult to titrate entresto due to BP; if we do, will probably need to then decrease diuretic - sees cardiologist Jenny Cooper) 04/22/17  2: Atrial fibrillation- - currently in NSR -  on amiodarone, apixaban  3: HTN- - BP on the low side but without dizziness - sees PCP Jenny Cooper) end of this month  Medication bottles were reviewed.  Return in 1 month or sooner  for any questions/problems before then.

## 2017-04-19 ENCOUNTER — Other Ambulatory Visit: Payer: Self-pay | Admitting: Internal Medicine

## 2017-04-21 ENCOUNTER — Ambulatory Visit (INDEPENDENT_AMBULATORY_CARE_PROVIDER_SITE_OTHER): Payer: Medicare Other | Admitting: Internal Medicine

## 2017-04-21 ENCOUNTER — Encounter: Payer: Self-pay | Admitting: Internal Medicine

## 2017-04-21 VITALS — BP 104/60 | HR 64 | Ht 68.0 in | Wt 162.0 lb

## 2017-04-21 DIAGNOSIS — I5022 Chronic systolic (congestive) heart failure: Secondary | ICD-10-CM

## 2017-04-21 DIAGNOSIS — I480 Paroxysmal atrial fibrillation: Secondary | ICD-10-CM

## 2017-04-21 DIAGNOSIS — I25708 Atherosclerosis of coronary artery bypass graft(s), unspecified, with other forms of angina pectoris: Secondary | ICD-10-CM | POA: Insufficient documentation

## 2017-04-21 DIAGNOSIS — Z23 Encounter for immunization: Secondary | ICD-10-CM

## 2017-04-21 DIAGNOSIS — J449 Chronic obstructive pulmonary disease, unspecified: Secondary | ICD-10-CM

## 2017-04-21 NOTE — Progress Notes (Signed)
Patient ID: Jenny Cooper, female   DOB: [birth year], 81 y.o.   MRN: [id number] Cardiology Office Note  Date:  04/22/2017   ID:  Onesha, Krebbs May 14, 1936, MRN [id number]  PCP:  Glean Hess, MD   Chief Complaint  Patient presents with  . other    5-6 week follow up. Meds reviewed by the pt's med list. "doing well."     HPI:  81 y.o. female with h/o  CAD  s/p remote history of MI in 2002  2 vessel CABG post stenting in 2002,  mitral valve repair in 2002 secondary to mitral regurgitation,  PAF s/p prior TEE/DCCV on Eliquis,  atypical atrial flutter s/p ablation on 07/27/2013  atrial fibrillation and typical atrial flutter ablation on 9/11/2017at [facility] s/p MDT PPM,   COPD,  HTN,   HLD  [facility] on 04/01/15 with increased SOB, PNA, elevated troponin.  Quit smoking in 2001 carcinoma of right lung, has completed chemotherapy  falls, and chronic back pain from collapsed vertebrae Suffered pelvic fracture after fall in September 2017 She presents today to the clinic for follow-up of her atrial fibrillation and CAD  On her last clinic visit she was in atrial flutter with rapid rate Started on amiodarone after discussion with [facility] EP She had worsening shortness of breath, presented to the emergency room Admitted to the hospital with Cardioversion, 04/08/17 Lasix IV for management of her CHF Compliant with her eliquis  Echocardiogram showing EF 30 to 35%, moderately elevated RVSP Seen previously  As outpatient , on lasix 40 BID Wore an event monitor 5 more days when she got home. Results pending  EKG personally reviewed by myself on todays visit Shows paced rhythm rate 62 bpm  Conditioning has declined in general, increasing leg weakness Does not present today with cane or walker  Other past medical history reviewed Cardioversion at the end of May 2018 for atrial flutter Since that time she has felt relatively well, recent vacation in [place] the past week Reports  that she did not feel right at times but unable to put her finger on close going on  Previously on prednisone for shortness of breath, managed by pulmonary  CT scan September 2016 showing improvement of her lung cancer She finished her cancer treatment over the summer 2016, she had chemotherapy and radiation  TEE/DCCV in 2013 for her PAF. In 2014 she was diagnosed with atypical atrial flutter and underwent ablation. underwent PPM generator change in 2013.   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  finished concurrent chemoradiation with carboplatinum and Taxol and PET scan showed significant response.   present to [facility] on 8/2. She complained of increase SOB, cough that was productive of green to yellow sputum, nausea, and vomiting.  required BiPAP for a short time upon her arrival. CXR showed bibasilar airspace disease right greater than left has progressed significantly from the prior study, worrisome for recurrent carcinoma however pneumonia could also have this appearance. CT chest has been ordered.   Troponin was found to be 0.48-->1.83.  Echo showed EF 30-35%, severe anterior and infero/posterior wall HK. Left ventricular function parameters were normal, mild to moderate MR. Left atrium was mildly dilated. RV systolic function was normal. Mild to moderate TR. PASP was moderately to severely elevated at 60 mm Hg.   outpatient cardiac cath on 04/28/2015 that showed 3 vessel CAD with patent SVG to LAD, occluded SVG to OM, native RCA had severe ISR  in the mid-segment and was occluded distally at the sites of previously placed stents. There were left to right collaterals. Moderately reduced LVSF with EF of 35-40%. Mildly elevated LVEDP. There were no good targets for revascularization. medical therapy.   cardiac event monitor to evaluate her Afib burden that showed 50% Afib burden with a peak heart rate of 116, mostly rate controlled. Given this  finding her Toprol was titrated up on 9/6 to 50 mg bid.   PMH:   has a past medical history of Atypical atrial flutter ([place]); CAD (coronary artery disease); Carcinoma of right lung ([place]) (01/03/2015); Chronic systolic CHF (congestive heart failure) ([place]); Complication of anesthesia; Compressed spine fracture ([place]) (08/06/2016); COPD (chronic obstructive pulmonary disease) ([place]); Fractured pelvis ([place]) (05/26/2016); GERD (gastroesophageal reflux disease); History of blood clots; History of colonoscopy (2013); History of mammography, screening (2015); History of Papanicolaou smear of cervix (2013); HLD (hyperlipidemia); HTN (hypertension); Hypothyroidism; Lung cancer ([place]); Mitral regurgitation; Myocardial infarction [facility]); Neuropathy; Pacemaker; PAF (paroxysmal atrial fibrillation) ([place]); Personal history of chemotherapy; and Personal history of radiation therapy.  PSH:    Past Surgical History:  Procedure Laterality Date  . ABLATION  04/2016   [facility]  . APPENDECTOMY    . CARDIAC CATHETERIZATION N/A 04/28/2015   Procedure: Left Heart Cath and Coronary Angiography;  Surgeon: Wellington Hampshire, MD;  Location: [place] [facility];  Service: Cardiovascular;  Laterality: N/A;  . CARDIOVERSION N/A 04/08/2017   Procedure: CARDIOVERSION;  Surgeon: Wellington Hampshire, MD;  Location: [facility] ORS;  Service: Cardiovascular;  Laterality: N/A;  . COLONOSCOPY  12/2009   2 small tubular adenomas  . CORONARY ARTERY BYPASS GRAFT  09/2000  . ECTOPIC PREGNANCY SURGERY    . ELECTROMAGNETIC NAVIGATION BROCHOSCOPY N/A 10/06/2015   Procedure: ELECTROMAGNETIC NAVIGATION BRONCHOSCOPY;  Surgeon: Flora Lipps, MD;  Location: [facility] ORS;  Service: Cardiopulmonary;  Laterality: N/A;  . ENDOBRONCHIAL ULTRASOUND N/A 10/06/2015   Procedure: ENDOBRONCHIAL ULTRASOUND;  Surgeon: Flora Lipps, MD;  Location: [facility] ORS;  Service: Cardiopulmonary;  Laterality: N/A;  . KYPHOPLASTY N/A 09/28/2016   Procedure: KYPHOPLASTY;  Surgeon: Hessie Knows, MD;   Location: [facility] ORS;  Service: Orthopedics;  Laterality: N/A;  . PACEMAKER INSERTION  03/2012  . thorocentesis  12/24/2016  . VAGINAL HYSTERECTOMY     partial - left ovary remains    Current Outpatient Prescriptions  Medication Sig Dispense Refill  . acetaminophen (TYLENOL) 500 MG tablet Take 1,000 mg by mouth every 6 (six) hours.     Marland Kitchen amiodarone (PACERONE) 200 MG tablet Take 200 mg by mouth 2 (two) times daily.    Marland Kitchen apixaban (ELIQUIS) 5 MG TABS tablet Take 5 mg by mouth 2 (two) times daily.     Marland Kitchen atorvastatin (LIPITOR) 20 MG tablet TAKE 1 TABLET (20 MG TOTAL) BY MOUTH AT BEDTIME. 30 tablet 5  . Calcium Carb-Cholecalciferol (CALCIUM 600-D PO) Take 1 tablet by mouth every morning.     . Cetirizine HCl 10 MG CAPS Take 1 capsule (10 mg total) by mouth 1 day or 1 dose. (Patient taking differently: Take 1 capsule by mouth daily. ) 30 capsule 5  . furosemide (LASIX) 40 MG tablet Take 1 tablet (40 mg total) by mouth 2 (two) times daily. 30 tablet 0  . levothyroxine (SYNTHROID, LEVOTHROID) 75 MCG tablet TAKE 1 TABLET (75 MCG TOTAL) BY MOUTH DAILY BEFORE BREAKFAST. 30 tablet 5  . metoprolol succinate (TOPROL-XL) 25 MG 24 hr tablet TAKE 1 TABLET BY MOUTH EVERY MORNING AND 2 TABS IN THE EVENING  90 tablet 3  . omeprazole (PRILOSEC) 20 MG capsule Take 20 mg by mouth daily.    . predniSONE (DELTASONE) 10 MG tablet Take 1 tablet (10 mg total) by mouth daily with breakfast. 14 tablet 0  . sacubitril-valsartan (ENTRESTO) 24-26 MG Take 1 tablet by mouth 2 (two) times daily. 60 tablet 6  . senna (SENOKOT) 8.6 MG TABS tablet Take 1 tablet (8.6 mg total) by mouth daily. 30 each 0  . umeclidinium bromide (INCRUSE ELLIPTA) 62.5 MCG/INH AEPB Inhale 1 puff into the lungs daily. 1 each 0   No current facility-administered medications for this visit.    Facility-Administered Medications Ordered in Other Visits  Medication Dose Route Frequency Provider Last Rate Last Dose  . sodium chloride 0.9 % injection 10 mL  10  mL Intravenous PRN Forest Gleason, MD   10 mL at 02/19/15 1000  . sodium chloride flush (NS) 0.9 % injection 10 mL  10 mL Intravenous PRN Cammie Sickle, MD   10 mL at 02/16/16 1048     Allergies:   Lovenox [enoxaparin sodium] and Meperidine   Social History:  The patient  reports that she quit smoking about 16 years ago. Her smoking use included Cigarettes. She has a 84.00 pack-year smoking history. She has never used smokeless tobacco. She reports that she drinks about 6.0 oz of alcohol per week . She reports that she does not use drugs.   Family History:   family history includes COPD in her mother; Colon cancer in her brother; Hypertension in her brother and sister; Stroke in her maternal grandmother.    Review of Systems: Review of Systems  Respiratory: Negative.   Cardiovascular: Negative.   Gastrointestinal: Negative.   Musculoskeletal: Positive for back pain.       Leg weakness  Neurological: Negative.   Psychiatric/Behavioral: Negative.   All other systems reviewed and are negative.    PHYSICAL EXAM: VS:  BP 130/70 (BP Location: Left Arm, Patient Position: Sitting, Cuff Size: Normal)   Pulse 62   Ht 5\' 6"  (1.676 m)   Wt 163 lb 8 oz (74.2 kg)   BMI 26.39 kg/m  , BMI Body mass index is 26.39 kg/m. GEN:  in no acute distress , frail HEENT: normal  Neck: no JVD, carotid bruits, or masses Cardiac: Regular, rapid, no murmurs, rubs, or gallops,no edema  Respiratory:  Mildly decreased breath sounds throughout, normal work of breathing GI: soft, nontender, nondistended, + BS MS: no deformity or atrophy  Skin: warm and dry, no rash Neuro:  Strength and sensation are intact Psych: euthymic mood, full affect    Recent Labs: 08/16/2016: ALT 11 04/06/2017: B Natriuretic Peptide 1,022.0; Magnesium 1.8 04/07/2017: Hemoglobin 12.3; Platelets 209 04/08/2017: BUN 29; Creatinine, Ser 0.98; Potassium 4.2; Sodium 141    Lipid Panel Lab Results  Component Value Date   CHOL  128 02/16/2016   HDL 56 02/16/2016   LDLCALC 45 02/16/2016   TRIG 133 02/16/2016      Wt Readings from Last 3 Encounters:  04/22/17 163 lb 8 oz (74.2 kg)  04/21/17 162 lb (73.5 kg)  04/14/17 159 lb 4 oz (72.2 kg)       ASSESSMENT AND PLAN:  Essential hypertension -  Blood pressure is well controlled on today's visit. No changes made to the medications.  Paroxysmal atrial fibrillation (HCC) - Previous ablation at [facility] On anticoagulation  Atrial flutter Followed by Dr. Glennon Mac, EP at [facility] cardioversion in the hospital Recommended she stay on  amiodarone 200 mg daily She has close follow-up with EP next month  Hyperlipidemia - Encouraged her to stay on her Lipitor  Chronic systolic CHF (congestive heart failure) ([place]) Recommended she stay on Lasix 40 twice a day Basic metabolic panel ordered today  DM type 2, goal A1c below 7 Continue aggressive diet Unable to exercise  COPD exacerbation ([place]) Previously treated with prednisone Followed by pulmonary She reports stable symptoms  Chronic back pain Completed physical therapy, scheduled to see orthopedics   Total encounter time more than 25 minutes  Greater than 50% was spent in counseling and coordination of care with the patient   Disposition:   F/U  6 months   Orders Placed This Encounter  Procedures  . EKG 12-Lead     Signed, Esmond Plants, M.D., Ph.D. 04/22/2017  [place], [place]

## 2017-04-21 NOTE — Patient Instructions (Addendum)
Pneumococcal Conjugate Vaccine (PCV13) What You Need to Know 1. Why get vaccinated? Vaccination can protect both children and adults from pneumococcal disease. Pneumococcal disease is caused by bacteria that can spread from person to person through close contact. It can cause ear infections, and it can also lead to more serious infections of the:  Lungs (pneumonia),  Blood (bacteremia), and  Covering of the brain and spinal cord (meningitis).  Pneumococcal pneumonia is most common among adults. Pneumococcal meningitis can cause deafness and brain damage, and it kills about 1 child in 10 who get it. Anyone can get pneumococcal disease, but children under 2 years of age and adults 65 years and older, people with certain medical conditions, and cigarette smokers are at the highest risk. Before there was a vaccine, the [place] saw:  more than 700 cases of meningitis,  about 13,000 blood infections,  about 5 million ear infections, and  about 200 deaths  in children under 5 each year from pneumococcal disease. Since vaccine became available, severe pneumococcal disease in these children has fallen by 88%. About 18,000 older adults die of pneumococcal disease each year in the [place]. Treatment of pneumococcal infections with penicillin and other drugs is not as effective as it used to be, because some strains of the disease have become resistant to these drugs. This makes prevention of the disease, through vaccination, even more important. 2. PCV13 vaccine Pneumococcal conjugate vaccine (called PCV13) protects against 13 types of pneumococcal bacteria. PCV13 is routinely given to children at 2, 4, 6, and 12-15 months of age. It is also recommended for children and adults 2 to 64 years of age with certain health conditions, and for all adults 65 years of age and older. Your doctor can give you details. 3. Some people should not get this vaccine Anyone who has ever had a  life-threatening allergic reaction to a dose of this vaccine, to an earlier pneumococcal vaccine called PCV7, or to any vaccine containing diphtheria toxoid (for example, DTaP), should not get PCV13. Anyone with a severe allergy to any component of PCV13 should not get the vaccine. Tell your doctor if the person being vaccinated has any severe allergies. If the person scheduled for vaccination is not feeling well, your healthcare provider might decide to reschedule the shot on another day. 4. Risks of a vaccine reaction With any medicine, including vaccines, there is a chance of reactions. These are usually mild and go away on their own, but serious reactions are also possible. Problems reported following PCV13 varied by age and dose in the series. The most common problems reported among children were:  About half became drowsy after the shot, had a temporary loss of appetite, or had redness or tenderness where the shot was given.  About 1 out of 3 had swelling where the shot was given.  About 1 out of 3 had a mild fever, and about 1 in 20 had a fever over 102.2F.  Up to about 8 out of 10 became fussy or irritable.  Adults have reported pain, redness, and swelling where the shot was given; also mild fever, fatigue, headache, chills, or muscle pain. Young children who get PCV13 along with inactivated flu vaccine at the same time may be at increased risk for seizures caused by fever. Ask your doctor for more information. Problems that could happen after any vaccine:  People sometimes faint after a medical procedure, including vaccination. Sitting or lying down for about 15 minutes can help prevent   fainting, and injuries caused by a fall. Tell your doctor if you feel dizzy, or have vision changes or ringing in the ears.  Some older children and adults get severe pain in the shoulder and have difficulty moving the arm where a shot was given. This happens very rarely.  Any medication can cause a  severe allergic reaction. Such reactions from a vaccine are very rare, estimated at about 1 in a million doses, and would happen within a few minutes to a few hours after the vaccination. As with any medicine, there is a very small chance of a vaccine causing a serious injury or death. The safety of vaccines is always being monitored. For more information, visit: www.cdc.gov/vaccinesafety/ 5. What if there is a serious reaction? What should I look for? Look for anything that concerns you, such as signs of a severe allergic reaction, very high fever, or unusual behavior. Signs of a severe allergic reaction can include hives, swelling of the face and throat, difficulty breathing, a fast heartbeat, dizziness, and weakness-usually within a few minutes to a few hours after the vaccination. What should I do?  If you think it is a severe allergic reaction or other emergency that can't wait, call 9-1-1 or get the person to the nearest hospital. Otherwise, call your doctor.  Reactions should be reported to the Vaccine Adverse Event Reporting System (VAERS). Your doctor should file this report, or you can do it yourself through the VAERS web site at www.vaers.hhs.gov, or by calling 1-800-822-7967. ? VAERS does not give medical advice. 6. The National Vaccine Injury Compensation Program The National Vaccine Injury Compensation Program (VICP) is a federal program that was created to compensate people who may have been injured by certain vaccines. Persons who believe they may have been injured by a vaccine can learn about the program and about filing a claim by calling 1-800-338-2382 or visiting the VICP website at www.hrsa.gov/vaccinecompensation. There is a time limit to file a claim for compensation. 7. How can I learn more?  Ask your healthcare provider. He or she can give you the vaccine package insert or suggest other sources of information.  Call your local or state health department.  Contact the  Centers for Disease Control and Prevention (CDC): ? Call 1-800-232-4636 (1-800-CDC-INFO) or ? Visit CDC's website at www.cdc.gov/vaccines Vaccine Information Statement, PCV13 Vaccine (07/04/2014) This information is not intended to replace advice given to you by your health care provider. Make sure you discuss any questions you have with your health care provider. Document Released: 06/13/2006 Document Revised: 05/06/2016 Document Reviewed: 05/06/2016 Elsevier Interactive Patient Education  2017 Elsevier Inc.  

## 2017-04-21 NOTE — Progress Notes (Signed)
Date:  04/21/2017   Name:  Jenny Cooper   DOB:  March 10, 1936   MRN:  [id number]   Chief Complaint: Hospitalization Follow-up (CHF- See's cardiologist tomorrow. Feeling much better. Heart is back in rhythm. ) Admitted to [facility] with CHF and respiratory failure from 04/06/17 to 04/08/17.  1. Acute on chronic hypoxic respiratory failure in the setting of tachycardia mediated cardiomyopathy, pulmonary edema Patient was weaned off BiPAP and on nasal cannula. She wears 2 L of oxygen at night, now she needs O2 [place] 2L at home.  2. Acute on chronic systolic congestive heart failure in the setting of tachycardia mediated cardiomyopathy Echocardiogram: LV EF: 30% - 35%. She is on IV Lasix 40 mg twice a day. Changed to by mouth 40 mg twice a day for 7 days then cardiologist will adjust dose as outpatient per cardiologist. Continue Entresto  3. Atrial flutter/fibrillation: s/p cardioversion this am. Back to normal sinus rhythm. Continue amiodarone,metoprolol and Eliquis  COPD - on O2 with sats ranging from 89-95 at home.  No worsening cough, sputum production, wheezing.  Due for Prevnar-13 and Influenza vaccines.  Thyroid - supplemented. Lab Results  Component Value Date   TSH 1.840 03/10/2016     Review of Systems  Constitutional: Positive for fatigue. Negative for chills, fever and unexpected weight change (staying around 160 lbs).  Eyes: Negative for visual disturbance.  Respiratory: Positive for shortness of breath. Negative for cough, chest tightness and wheezing.   Cardiovascular: Negative for chest pain, palpitations and leg swelling.  Gastrointestinal: Negative for abdominal pain.  Genitourinary: Negative for difficulty urinating and dysuria.  Neurological: Negative for dizziness and headaches.    Patient Active Problem List   Diagnosis Date Noted  . Chronic systolic heart failure ([place]) 04/14/2017  . Sinus node dysfunction ([place]) 04/06/2017  . Back pain of thoracolumbar  region 03/15/2017  . PAF (paroxysmal atrial fibrillation) ([place]) 03/05/2017  . Falls frequently 03/05/2017  . Pleural effusion, right 12/23/2016  . Cancer of hilus of right lung ([place]) 12/14/2016  . Compression fracture of L2 lumbar vertebra ([place]) 08/31/2016  . Acute midline low back pain without sciatica 08/13/2016  . Fracture of multiple pubic rami, right, closed, initial encounter ([place]) 05/30/2016  . Cardiomyopathy ([place]) 04/29/2016  . Type 2 diabetes mellitus with hemoglobin A1c goal of less than 7.0% ([place]) 11/17/2015  . Hx of adenomatous colonic polyps 11/17/2015  . Radiation pneumonitis ([place]) 10/21/2015  . Mitral regurgitation   . HLD (hyperlipidemia)   . Paroxysmal supraventricular tachycardia ([place])   . Coronary artery disease involving coronary bypass graft of native heart without angina pectoris   . NSTEMI (non-ST elevated myocardial infarction) ([place])   . Arteriosclerosis of coronary artery 01/11/2015  . Hypothyroidism (acquired) 01/11/2015  . Disorder of peripheral nervous system 10/04/2014  . Cervical radiculopathy, chronic 10/04/2014  . Lung cancer ([place]) 10/04/2014  . Atrial flutter ([place]) 11/16/2013  . Chronic obstructive pulmonary disease ([place]) 04/24/2012  . Hypertension 08/03/2011  . Pacemaker 08/03/2011    Prior to Admission medications   Medication Sig Start Date End Date Taking? Authorizing Provider  acetaminophen (TYLENOL) 500 MG tablet Take 1,000 mg by mouth every 6 (six) hours.    Yes [provider]  amiodarone (PACERONE) 200 MG tablet Take 200 mg by mouth 2 (two) times daily.   Yes [provider]  apixaban (ELIQUIS) 5 MG TABS tablet Take 5 mg by mouth 2 (two) times daily.  08/04/16  Yes [provider]  atorvastatin (LIPITOR)  20 MG tablet TAKE 1 TABLET (20 MG TOTAL) BY MOUTH AT BEDTIME. 04/19/17  Yes Glean Hess, MD  Calcium Carb-Cholecalciferol (CALCIUM 600-D PO) Take 1 tablet by mouth every morning.    Yes [provider]   Cetirizine HCl 10 MG CAPS Take 1 capsule (10 mg total) by mouth 1 day or 1 dose. Patient taking differently: Take 1 capsule by mouth daily.  05/19/15  Yes Dunn, Areta Haber, PA-C  furosemide (LASIX) 40 MG tablet Take 1 tablet (40 mg total) by mouth 2 (two) times daily. 04/08/17  Yes Demetrios Loll, MD  levothyroxine (SYNTHROID, LEVOTHROID) 75 MCG tablet TAKE 1 TABLET (75 MCG TOTAL) BY MOUTH DAILY BEFORE BREAKFAST. 11/22/16  Yes Glean Hess, MD  metoprolol succinate (TOPROL-XL) 25 MG 24 hr tablet TAKE 1 TABLET BY MOUTH EVERY MORNING AND 2 TABS IN THE EVENING 03/21/17  Yes Gollan, Kathlene November, MD  omeprazole (PRILOSEC) 20 MG capsule Take 20 mg by mouth daily.   Yes [provider]  predniSONE (DELTASONE) 10 MG tablet Take 1 tablet (10 mg total) by mouth daily with breakfast. 04/01/17  Yes Kasa, Kurian, MD  sacubitril-valsartan (ENTRESTO) 24-26 MG Take 1 tablet by mouth 2 (two) times daily. 01/10/17  Yes Minna Merritts, MD  senna (SENOKOT) 8.6 MG TABS tablet Take 1 tablet (8.6 mg total) by mouth daily. 05/27/16  Yes Veronese, [place], MD  umeclidinium bromide (INCRUSE ELLIPTA) 62.5 MCG/INH AEPB Inhale 1 puff into the lungs daily. 02/09/17  Yes Flora Lipps, MD    Allergies  Allergen Reactions  . Lovenox [Enoxaparin Sodium] Itching  . Meperidine Other (See Comments)    Other Reaction: pt does not like how it makes her feel    Past Surgical History:  Procedure Laterality Date  . ABLATION  04/2016   [facility]  . APPENDECTOMY    . CARDIAC CATHETERIZATION N/A 04/28/2015   Procedure: Left Heart Cath and Coronary Angiography;  Surgeon: Wellington Hampshire, MD;  Location: [facility];  Service: Cardiovascular;  Laterality: N/A;  . CARDIOVERSION N/A 04/08/2017   Procedure: CARDIOVERSION;  Surgeon: Wellington Hampshire, MD;  Location: [facility];  Service: Cardiovascular;  Laterality: N/A;  . COLONOSCOPY  12/2009   2 small tubular adenomas  . CORONARY ARTERY BYPASS GRAFT  09/2000  . ECTOPIC PREGNANCY  SURGERY    . ELECTROMAGNETIC NAVIGATION BROCHOSCOPY N/A 10/06/2015   Procedure: ELECTROMAGNETIC NAVIGATION BRONCHOSCOPY;  Surgeon: Flora Lipps, MD;  Location: [facility];  Service: Cardiopulmonary;  Laterality: N/A;  . ENDOBRONCHIAL ULTRASOUND N/A 10/06/2015   Procedure: ENDOBRONCHIAL ULTRASOUND;  Surgeon: Flora Lipps, MD;  Location: [facility];  Service: Cardiopulmonary;  Laterality: N/A;  . KYPHOPLASTY N/A 09/28/2016   Procedure: KYPHOPLASTY;  Surgeon: Hessie Knows, MD;  Location: [facility];  Service: Orthopedics;  Laterality: N/A;  . PACEMAKER INSERTION  03/2012  . thorocentesis  12/24/2016  . VAGINAL HYSTERECTOMY     partial - left ovary remains    Social History  Substance Use Topics  . Smoking status: Former Smoker    Packs/day: 2.00    Years: 42.00    Types: Cigarettes    Quit date: 08/30/2000  . Smokeless tobacco: Never Used     Comment: quit smoking in 08/28/2000  . Alcohol use 6.0 oz/week    10 Glasses of wine per week     Comment: 2 glasses of wine per day     Medication list has been reviewed and updated.   Physical Exam  Constitutional: She is oriented  to person, place, and time. She appears well-developed.  HENT:  Head: Normocephalic and atraumatic.  Cardiovascular: Normal rate and regular rhythm.   Occasional extrasystoles are present. Exam reveals distant heart sounds. Exam reveals no gallop.   Pulmonary/Chest: Effort normal. No respiratory distress. She has decreased breath sounds. She has no wheezes. She has no rhonchi.  Musculoskeletal: Normal range of motion.  Neurological: She is alert and oriented to person, place, and time.  Skin: Skin is warm and dry. No rash noted.  Psychiatric: She has a normal mood and affect. Her speech is normal and behavior is normal. Thought content normal.  Nursing note and vitals reviewed.   BP 104/60   Pulse 64   Ht 5\' 8"  (1.727 m)   Wt 162 lb (73.5 kg)   SpO2 95%   BMI 24.63 kg/m   Assessment and Plan: 1. Chronic systolic  heart failure (HCC) Stabilized on current medications Follow up with Cardiology tomorrow  2. PAF (paroxysmal atrial fibrillation) ([place]) In SR now  3. Chronic obstructive pulmonary disease, unspecified COPD type ([place]) On O2 around the clock Continue inhalers Needs Prevnar-13  4. Need for pneumococcal vaccination - Pneumococcal conjugate vaccine 13-valent IM  5. Need for influenza vaccination - Flu Vaccine QUAD 36+ mos IM   No orders of the defined types were placed in this encounter.   Halina Maidens, MD [facility]  04/21/2017

## 2017-04-22 ENCOUNTER — Ambulatory Visit (INDEPENDENT_AMBULATORY_CARE_PROVIDER_SITE_OTHER): Payer: Medicare Other | Admitting: Cardiovascular Disease

## 2017-04-22 ENCOUNTER — Encounter: Payer: Self-pay | Admitting: Cardiovascular Disease

## 2017-04-22 VITALS — BP 130/70 | HR 62 | Ht 66.0 in | Wt 163.5 lb

## 2017-04-22 DIAGNOSIS — I480 Paroxysmal atrial fibrillation: Secondary | ICD-10-CM

## 2017-04-22 DIAGNOSIS — E119 Type 2 diabetes mellitus without complications: Secondary | ICD-10-CM

## 2017-04-22 DIAGNOSIS — I209 Angina pectoris, unspecified: Secondary | ICD-10-CM

## 2017-04-22 DIAGNOSIS — I420 Dilated cardiomyopathy: Secondary | ICD-10-CM | POA: Diagnosis not present

## 2017-04-22 DIAGNOSIS — I5022 Chronic systolic (congestive) heart failure: Secondary | ICD-10-CM | POA: Diagnosis not present

## 2017-04-22 DIAGNOSIS — I2510 Atherosclerotic heart disease of native coronary artery without angina pectoris: Secondary | ICD-10-CM

## 2017-04-22 DIAGNOSIS — I25708 Atherosclerosis of coronary artery bypass graft(s), unspecified, with other forms of angina pectoris: Secondary | ICD-10-CM

## 2017-04-22 DIAGNOSIS — J449 Chronic obstructive pulmonary disease, unspecified: Secondary | ICD-10-CM | POA: Diagnosis not present

## 2017-04-22 DIAGNOSIS — I483 Typical atrial flutter: Secondary | ICD-10-CM | POA: Diagnosis not present

## 2017-04-22 DIAGNOSIS — Z950 Presence of cardiac pacemaker: Secondary | ICD-10-CM | POA: Diagnosis not present

## 2017-04-22 DIAGNOSIS — C3490 Malignant neoplasm of unspecified part of unspecified bronchus or lung: Secondary | ICD-10-CM | POA: Diagnosis not present

## 2017-04-22 DIAGNOSIS — J90 Pleural effusion, not elsewhere classified: Secondary | ICD-10-CM

## 2017-04-22 DIAGNOSIS — E782 Mixed hyperlipidemia: Secondary | ICD-10-CM

## 2017-04-22 NOTE — Patient Instructions (Addendum)
Medication Instructions:   Take amiodarone one a day  Labwork:  BMP today  Testing/Procedures:  No further testing at this time   Follow-Up: It was a pleasure seeing you in the office today. Please call us if you have new issues that need to be addressed before your next appt.  [phone]  Your physician wants you to follow-up in: 6 months.  You will receive a reminder letter in the mail two months in advance. If you don't receive a letter, please call our office to schedule the follow-up appointment.  If you need a refill on your cardiac medications before your next appointment, please call your pharmacy.

## 2017-04-23 LAB — BASIC METABOLIC PANEL
BUN / CREAT RATIO: 18 (ref 12–28)
BUN: 21 mg/dL (ref 8–27)
CHLORIDE: 93 mmol/L — AB (ref 96–106)
CO2: 32 mmol/L — ABNORMAL HIGH (ref 20–29)
Calcium: 9.6 mg/dL (ref 8.7–10.3)
Creatinine, Ser: 1.2 mg/dL — ABNORMAL HIGH (ref 0.57–1.00)
GFR calc non Af Amer: 43 mL/min/{1.73_m2} — ABNORMAL LOW (ref >59)
GFR, EST AFRICAN AMERICAN: 50 mL/min/{1.73_m2} — AB (ref >59)
Glucose: 111 mg/dL — ABNORMAL HIGH (ref 65–99)
POTASSIUM: 4.1 mmol/L (ref 3.5–5.2)
Sodium: 140 mmol/L (ref 134–144)

## 2017-04-26 ENCOUNTER — Other Ambulatory Visit: Payer: Self-pay | Admitting: Internal Medicine

## 2017-04-26 NOTE — Telephone Encounter (Signed)
I would prefer for her Cardiologist to refill this medication since it seems that it is being changed intermittently.  Please ask her to call them to refill.

## 2017-04-26 NOTE — Telephone Encounter (Signed)
Received paper refill request for Prednisone 10 mg. Per last ov note patient was take every other day per oncology. Patient was given #14 on 04/01/17. Please advise on refill.

## 2017-04-27 ENCOUNTER — Other Ambulatory Visit: Payer: Self-pay | Admitting: Internal Medicine

## 2017-04-27 NOTE — Telephone Encounter (Signed)
Per Dr. Rogue Bussing - Dr. Mortimer Fries order this, pt needs to contact Dr. Mortimer Fries for RFs

## 2017-04-27 NOTE — Telephone Encounter (Signed)
Please Follow oncology recs for prednisone therapy and refill accordingly Thank you

## 2017-04-28 ENCOUNTER — Ambulatory Visit: Payer: Medicare Other | Admitting: Internal Medicine

## 2017-04-29 ENCOUNTER — Encounter: Payer: Self-pay | Admitting: Internal Medicine

## 2017-04-30 ENCOUNTER — Encounter: Payer: Self-pay | Admitting: Cardiovascular Disease

## 2017-05-03 ENCOUNTER — Other Ambulatory Visit: Payer: Self-pay | Admitting: *Deleted

## 2017-05-03 MED ORDER — FUROSEMIDE 40 MG PO TABS: 40.0000 mg | ORAL_TABLET | Freq: Two times a day (BID) | ORAL | 6 refills | Status: DC

## 2017-05-03 MED ORDER — PREDNISONE 10 MG PO TABS: 10.0000 mg | ORAL_TABLET | Freq: Every day | ORAL | 0 refills | Status: DC

## 2017-05-03 NOTE — Telephone Encounter (Signed)
Spoke to pt and she already received medication from Cardiologist.

## 2017-05-10 ENCOUNTER — Inpatient Hospital Stay: Payer: Medicare Other | Attending: Internal Medicine | Admitting: Internal Medicine

## 2017-05-10 VITALS — BP 100/75 | HR 64 | Temp 96.6°F | Resp 16 | Wt 160.1 lb

## 2017-05-10 DIAGNOSIS — Z86718 Personal history of other venous thrombosis and embolism: Secondary | ICD-10-CM

## 2017-05-10 DIAGNOSIS — E039 Hypothyroidism, unspecified: Secondary | ICD-10-CM

## 2017-05-10 DIAGNOSIS — I5022 Chronic systolic (congestive) heart failure: Secondary | ICD-10-CM

## 2017-05-10 DIAGNOSIS — Z923 Personal history of irradiation: Secondary | ICD-10-CM

## 2017-05-10 DIAGNOSIS — I480 Paroxysmal atrial fibrillation: Secondary | ICD-10-CM | POA: Diagnosis not present

## 2017-05-10 DIAGNOSIS — C3401 Malignant neoplasm of right main bronchus: Secondary | ICD-10-CM | POA: Diagnosis present

## 2017-05-10 DIAGNOSIS — Z79899 Other long term (current) drug therapy: Secondary | ICD-10-CM | POA: Diagnosis not present

## 2017-05-10 DIAGNOSIS — I2510 Atherosclerotic heart disease of native coronary artery without angina pectoris: Secondary | ICD-10-CM

## 2017-05-10 DIAGNOSIS — Z8701 Personal history of pneumonia (recurrent): Secondary | ICD-10-CM | POA: Diagnosis not present

## 2017-05-10 DIAGNOSIS — I110 Hypertensive heart disease with heart failure: Secondary | ICD-10-CM

## 2017-05-10 DIAGNOSIS — Z9221 Personal history of antineoplastic chemotherapy: Secondary | ICD-10-CM

## 2017-05-10 DIAGNOSIS — Z7952 Long term (current) use of systemic steroids: Secondary | ICD-10-CM

## 2017-05-10 DIAGNOSIS — E785 Hyperlipidemia, unspecified: Secondary | ICD-10-CM

## 2017-05-10 DIAGNOSIS — J449 Chronic obstructive pulmonary disease, unspecified: Secondary | ICD-10-CM | POA: Diagnosis not present

## 2017-05-10 DIAGNOSIS — K219 Gastro-esophageal reflux disease without esophagitis: Secondary | ICD-10-CM | POA: Diagnosis not present

## 2017-05-10 DIAGNOSIS — Z7901 Long term (current) use of anticoagulants: Secondary | ICD-10-CM | POA: Diagnosis not present

## 2017-05-10 DIAGNOSIS — Z87891 Personal history of nicotine dependence: Secondary | ICD-10-CM | POA: Diagnosis not present

## 2017-05-10 NOTE — Assessment & Plan Note (Addendum)
Stage III squamous cell lung cancer s/p chemo-RT- 2016. Status post bronchoscopy in February 2017-negative for malignancy; May 5th PET scan shows no significant concerns for recurrent malignancy; shows radiation changes; pleural effusion [moderate-C discussion below].   # Clinically no evidence of progression at this time. Would recommend a follow-up CT scan of the chest with contrast in 2 months.  # Recent Right pleural effusion [may 2018]- status post thoracentesis with symptomatic improvement; likely from CHF. Currently resolved.  # Follow-up CT scan labs in 2 months.

## 2017-05-10 NOTE — Progress Notes (Signed)
[place] OFFICE PROGRESS NOTE  Patient Care Team: Glean Hess, MD as PCP - General (Internal Medicine) Minna Merritts, MD as Consulting Physician (Cardiology) Vilinda Boehringer, MD (Inactive) as Consulting Physician (Pulmonary Disease) Cammie Sickle, MD as Consulting Physician (Internal Medicine) Alisa Graff, FNP as Nurse Practitioner (Family Medicine)  Cancer Staging No matching staging information was found for the patient.    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 Biopsy from hilar area and lymph node station 4R was positive for squamous cell carcinoma.  Patient had compression of the right mainstem bronchus because of enlarged lymph node and a mass; [ T4 N1 M0 tumor stage IIIa ].  2. Started on radiation and chemotherapy from October 21, 2014 3. Finished 6 cycles of carboplatinum and Taxol  in March  29 th of 2016,  PET scan shows significant response  4. Started on consolidation chemotherapy.  Patient finished 2 cycles on July 2016 of carboplatin and Taxol. 5. Atrial fibrillation diagnosis in August of 2016 on eloquis 6. Repeat bronchoscopy was negative for any malignancy  In February 2017.  # SEP 7th 2017- CT [facility]- 5cm hilar mass; bil Ground glass opacities.   # Radiation Pneumonitis [Dr.Mungal] on Prednisone  # May 1st 2018- F one- No targettable mutations; Int-TMB; ? 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  female pleasant patient above history of Stage III squamous cell lung cancer status post chemoradiation [finished treatment July 2016]; With a recurrent right-sided pleural effusion status post thoracentesis [may 2018]; without any convincing evidence on recurrence  Further imaging s here for  Follow-up.   In the  interim patient was admitted to the hospital for  Shortness of breath/ noted to have congestive heart faiure A. Fib. Patient also had cardioversion done. I reviewed the records in detail.  Patient is currently off steroids; she is breathing better. Denies any worsening cough or swelling in the legs.   REVIEW OF SYSTEMS:  A complete 10 point review of system is done which is negative except mentioned above/history of present illness.   PAST MEDICAL HISTORY :  Past Medical History:  Diagnosis Date  . Atypical atrial flutter ([place])    a. s/p ablation 07/27/2013 followed by Dr. Rockey Situ  . CAD (coronary artery disease)    a. s/p MI x 2 in 2002 s/p PCI x 2 in 2002; b. s/p 2v CABG 2002; c. stress echo 07/2004 w/ evi of pos & inf infarct & no evi of ischemia; d. 4/08 dipyridamole scan w/ multiple areas of infarct, no ischemia, EF 49%; e. cath 04/28/15 3v CAD, med Rx rec, no targets for revasc, LM lum irregs, pLAD 30%, 100%, ost-pLCx 60%, [name] 99%, OM2 100%, p-mRCA 90%, [name] 100% L-R collats, VG-mLAD irregs, VG-OM2 oc  . Carcinoma of right lung ([place]) 01/03/2015   a. followed by Dr. Oliva Bustard  . Chronic systolic CHF (congestive heart failure) ([place])    a. echo 03/2015: EF 30-35%, sev ant/inf/pos HK, in mild to mod MR  . Complication of anesthesia    more recently patient oxygen levels do not rebound as quickly  . Compressed spine fracture ([place]) 08/06/2016   lumbar 2, t11, t12  .  COPD (chronic obstructive pulmonary disease) ([place])   . Fractured pelvis ([place]) 05/26/2016   2 places  . GERD (gastroesophageal reflux disease)   . History of blood clots    12/2001  . History of colonoscopy 2013  . History of mammography, screening 2015  . History of Papanicolaou smear of cervix 2013  . HLD (hyperlipidemia)   . HTN (hypertension)   . Hypothyroidism   . Lung cancer ([place])   . Mitral regurgitation    a. s/p mitral ring placement 09/2000; b. echo 09/2010: EF 50%, inf HK, post AK, mild MR, prosthetic mitral valve  ring w/ peak gradient of 10 mmHg; b. echo 2/13: EF 50%, mild MR/TR     . Myocardial infarction ([place])    X 2 (LAST ONE IN 2002)  . Neuropathy   . Pacemaker    a. MDT 2002; b. generator replacement 2013; c. followed by Dr. Omelia Blackwater, MD  . PAF (paroxysmal atrial fibrillation) ([place])    a. on Eliquis   . Personal history of chemotherapy   . Personal history of radiation therapy     PAST SURGICAL HISTORY :   Past Surgical History:  Procedure Laterality Date  . ABLATION  04/2016   [facility]  . APPENDECTOMY    . CARDIAC CATHETERIZATION N/A 04/28/2015   Procedure: Left Heart Cath and Coronary Angiography;  Surgeon: Wellington Hampshire, MD;  Location: [facility];  Service: Cardiovascular;  Laterality: N/A;  . CARDIOVERSION N/A 04/08/2017   Procedure: CARDIOVERSION;  Surgeon: Wellington Hampshire, MD;  Location: [facility];  Service: Cardiovascular;  Laterality: N/A;  . COLONOSCOPY  12/2009   2 small tubular adenomas  . CORONARY ARTERY BYPASS GRAFT  09/2000  . ECTOPIC PREGNANCY SURGERY    . ELECTROMAGNETIC NAVIGATION BROCHOSCOPY N/A 10/06/2015   Procedure: ELECTROMAGNETIC NAVIGATION BRONCHOSCOPY;  Surgeon: Flora Lipps, MD;  Location: [facility];  Service: Cardiopulmonary;  Laterality: N/A;  . ENDOBRONCHIAL ULTRASOUND N/A 10/06/2015   Procedure: ENDOBRONCHIAL ULTRASOUND;  Surgeon: Flora Lipps, MD;  Location: [facility];  Service: Cardiopulmonary;  Laterality: N/A;  . KYPHOPLASTY N/A 09/28/2016   Procedure: KYPHOPLASTY;  Surgeon: Hessie Knows, MD;  Location: [facility];  Service: Orthopedics;  Laterality: N/A;  . PACEMAKER INSERTION  03/2012  . thorocentesis  12/24/2016  . VAGINAL HYSTERECTOMY     partial - left ovary remains    FAMILY HISTORY :   Family History  Problem Relation Age of Onset  . COPD Mother        sister, and brother  . Stroke Maternal Grandmother   . Hypertension Sister   . Hypertension Brother   . Colon cancer Brother        age 17  . Heart attack Neg Hx   . Breast cancer Neg Hx      SOCIAL HISTORY:   Social History  Substance Use Topics  . Smoking status: Former Smoker    Packs/day: 2.00    Years: 42.00    Types: Cigarettes    Quit date: 08/30/2000  . Smokeless tobacco: Never Used     Comment: quit smoking in 08/28/2000  . Alcohol use 6.0 oz/week    10 Glasses of wine per week     Comment: 2 glasses of wine per day    ALLERGIES:  is allergic to lovenox [enoxaparin sodium] and meperidine.  MEDICATIONS:  Current Outpatient Prescriptions  Medication Sig Dispense Refill  . acetaminophen (TYLENOL) 500 MG tablet Take 1,000 mg by mouth every 6 (six) hours.     Marland Kitchen  amiodarone (PACERONE) 200 MG tablet Take 200 mg by mouth 2 (two) times daily.    Marland Kitchen apixaban (ELIQUIS) 5 MG TABS tablet Take 5 mg by mouth 2 (two) times daily.     Marland Kitchen atorvastatin (LIPITOR) 20 MG tablet TAKE 1 TABLET (20 MG TOTAL) BY MOUTH AT BEDTIME. 30 tablet 5  . Calcium Carb-Cholecalciferol (CALCIUM 600-D PO) Take 1 tablet by mouth every morning.     . Cetirizine HCl 10 MG CAPS Take 1 capsule (10 mg total) by mouth 1 day or 1 dose. (Patient taking differently: Take 1 capsule by mouth daily. ) 30 capsule 5  . furosemide (LASIX) 40 MG tablet Take 1 tablet (40 mg total) by mouth 2 (two) times daily. 60 tablet 6  . levothyroxine (SYNTHROID, LEVOTHROID) 75 MCG tablet TAKE 1 TABLET (75 MCG TOTAL) BY MOUTH DAILY BEFORE BREAKFAST. 30 tablet 5  . metoprolol succinate (TOPROL-XL) 25 MG 24 hr tablet TAKE 1 TABLET BY MOUTH EVERY MORNING AND 2 TABS IN THE EVENING 90 tablet 3  . sacubitril-valsartan (ENTRESTO) 24-26 MG Take 1 tablet by mouth 2 (two) times daily. 60 tablet 6  . senna (SENOKOT) 8.6 MG TABS tablet Take 1 tablet (8.6 mg total) by mouth daily. 30 each 0  . umeclidinium bromide (INCRUSE ELLIPTA) 62.5 MCG/INH AEPB Inhale 1 puff into the lungs daily. 1 each 0  . omeprazole (PRILOSEC) 20 MG capsule Take 20 mg by mouth daily.    . predniSONE (DELTASONE) 10 MG tablet Take 1 tablet (10 mg total) by mouth daily  with breakfast. (Patient not taking: Reported on 05/10/2017) 15 tablet 0   No current facility-administered medications for this visit.    Facility-Administered Medications Ordered in Other Visits  Medication Dose Route Frequency Provider Last Rate Last Dose  . sodium chloride 0.9 % injection 10 mL  10 mL Intravenous PRN Forest Gleason, MD   10 mL at 02/19/15 1000  . sodium chloride flush (NS) 0.9 % injection 10 mL  10 mL Intravenous PRN Cammie Sickle, MD   10 mL at 02/16/16 1048    PHYSICAL EXAMINATION: ECOG PERFORMANCE STATUS: 0 - Asymptomatic  BP 100/75 (BP Location: Left Arm, Patient Position: Sitting)   Pulse 64   Temp (!) 96.6 F (35.9 C) (Tympanic)   Resp 16   Wt 160 lb 0.9 oz (72.6 kg)   BMI 25.83 kg/m   Filed Weights   05/10/17 1105 05/10/17 1108  Weight: 160 lb 0.9 oz (72.6 kg) 160 lb 0.9 oz (72.6 kg)    GENERAL: Well-nourished well-developed; Alert, no distress and comfortable. She is alone.  EYES: no pallor or icterus OROPHARYNX: POSITIVE for thrush. no ulceration; good dentition  NECK: supple, no masses felt LYMPH:  no palpable lymphadenopathy in the cervical, axillary or inguinal regions LUNGS: Decreased breath sounds to auscultation And left more than right. No wheeze or crackles HEART/CVS: regular rate & rhythm and no murmurs; No lower extremity edema ABDOMEN:abdomen soft, non-tender and normal bowel sounds Musculoskeletal:no cyanosis of digits and no clubbing  PSYCH: alert & oriented x 3 with fluent speech NEURO: no focal motor/sensory deficits SKIN:  no rashes or significant lesions  LABORATORY DATA:  I have reviewed the data as listed    Component Value Date/Time   NA 140 04/22/2017 1101   NA 136 12/24/2014 1457   K 4.1 04/22/2017 1101   K 3.6 12/24/2014 1457   CL 93 (L) 04/22/2017 1101   CL 98 (L) 12/24/2014 1457   CO2 32 (H)  04/22/2017 1101   CO2 32 12/24/2014 1457   GLUCOSE 111 (H) 04/22/2017 1101   GLUCOSE 148 (H) 04/08/2017 0601    GLUCOSE 107 (H) 12/24/2014 1457   BUN 21 04/22/2017 1101   BUN 17 12/24/2014 1457   CREATININE 1.20 (H) 04/22/2017 1101   CREATININE 1.00 12/24/2014 1457   CALCIUM 9.6 04/22/2017 1101   CALCIUM 9.5 12/24/2014 1457   PROT 7.2 08/16/2016 0945   PROT 7.4 12/24/2014 1457   ALBUMIN 3.8 08/16/2016 0945   ALBUMIN 3.9 12/24/2014 1457   AST 18 08/16/2016 0945   AST 22 12/24/2014 1457   ALT 11 (L) 08/16/2016 0945   ALT 19 12/24/2014 1457   ALKPHOS 78 08/16/2016 0945   ALKPHOS 65 12/24/2014 1457   BILITOT 0.4 08/16/2016 0945   BILITOT 0.4 12/24/2014 1457   GFRNONAA 43 (L) 04/22/2017 1101   GFRNONAA 55 (L) 12/24/2014 1457   GFRAA 50 (L) 04/22/2017 1101   GFRAA >60 12/24/2014 1457    No results found for: SPEP, UPEP  Lab Results  Component Value Date   WBC 5.7 04/07/2017   NEUTROABS 7.7 (H) 04/06/2017   HGB 12.3 04/07/2017   HCT 36.6 04/07/2017   MCV 90.2 04/07/2017   PLT 209 04/07/2017      Chemistry      Component Value Date/Time   NA 140 04/22/2017 1101   NA 136 12/24/2014 1457   K 4.1 04/22/2017 1101   K 3.6 12/24/2014 1457   CL 93 (L) 04/22/2017 1101   CL 98 (L) 12/24/2014 1457   CO2 32 (H) 04/22/2017 1101   CO2 32 12/24/2014 1457   BUN 21 04/22/2017 1101   BUN 17 12/24/2014 1457   CREATININE 1.20 (H) 04/22/2017 1101   CREATININE 1.00 12/24/2014 1457      Component Value Date/Time   CALCIUM 9.6 04/22/2017 1101   CALCIUM 9.5 12/24/2014 1457   ALKPHOS 78 08/16/2016 0945   ALKPHOS 65 12/24/2014 1457   AST 18 08/16/2016 0945   AST 22 12/24/2014 1457   ALT 11 (L) 08/16/2016 0945   ALT 19 12/24/2014 1457   BILITOT 0.4 08/16/2016 0945   BILITOT 0.4 12/24/2014 1457       RADIOGRAPHIC STUDIES: I have personally reviewed the radiological images as listed and agreed with the findings in the report. No results found.   ASSESSMENT & PLAN:  Cancer of hilus of right lung ([place]) Stage III squamous cell lung cancer s/p chemo-RT- 2016. Status post bronchoscopy in  February 2017-negative for malignancy; May 5th PET scan shows no significant concerns for recurrent malignancy; shows radiation changes; pleural effusion [moderate-C discussion below].   # Clinically no evidence of progression at this time. Would recommend a follow-up CT scan of the chest with contrast in 2 months.  # Recent Right pleural effusion [may 2018]- status post thoracentesis with symptomatic improvement; likely from CHF. Currently resolved.  # Follow-up CT scan labs in 2 months.     Orders Placed This Encounter  Procedures  . CT CHEST W CONTRAST    Standing Status:   Future    Standing Expiration Date:   05/10/2018    Order Specific Question:   If indicated for the ordered procedure, I authorize the administration of contrast media per Radiology protocol    Answer:   Yes    Order Specific Question:   Preferred imaging location?    Answer:   [facility]    Order Specific Question:   Radiology Contrast Protocol - do  NOT remove file path    Answer:   _[email or web address]    Order Specific Question:   Reason for Exam additional comments    Answer:   lung cancer  . CBC with Differential/Platelet    Standing Status:   Future    Standing Expiration Date:   05/10/2018  . Comprehensive metabolic panel    Standing Status:   Future    Standing Expiration Date:   05/10/2018   All questions were answered. The patient knows to call the clinic with any problems, questions or concerns.      Cammie Sickle, MD 05/10/2017 12:45 PM

## 2017-05-16 ENCOUNTER — Ambulatory Visit: Payer: Medicare Other | Admitting: Family

## 2017-05-18 ENCOUNTER — Ambulatory Visit: Payer: Medicare Other | Attending: Family | Admitting: Family

## 2017-05-18 ENCOUNTER — Encounter: Payer: Self-pay | Admitting: Family

## 2017-05-18 VITALS — BP 111/51 | HR 64 | Resp 18 | Ht 66.0 in | Wt 163.2 lb

## 2017-05-18 DIAGNOSIS — Z79899 Other long term (current) drug therapy: Secondary | ICD-10-CM | POA: Insufficient documentation

## 2017-05-18 DIAGNOSIS — Z8249 Family history of ischemic heart disease and other diseases of the circulatory system: Secondary | ICD-10-CM | POA: Diagnosis not present

## 2017-05-18 DIAGNOSIS — I110 Hypertensive heart disease with heart failure: Secondary | ICD-10-CM | POA: Diagnosis present

## 2017-05-18 DIAGNOSIS — I480 Paroxysmal atrial fibrillation: Secondary | ICD-10-CM

## 2017-05-18 DIAGNOSIS — Z87891 Personal history of nicotine dependence: Secondary | ICD-10-CM | POA: Insufficient documentation

## 2017-05-18 DIAGNOSIS — J449 Chronic obstructive pulmonary disease, unspecified: Secondary | ICD-10-CM | POA: Insufficient documentation

## 2017-05-18 DIAGNOSIS — I10 Essential (primary) hypertension: Secondary | ICD-10-CM

## 2017-05-18 DIAGNOSIS — Z85118 Personal history of other malignant neoplasm of bronchus and lung: Secondary | ICD-10-CM | POA: Diagnosis not present

## 2017-05-18 DIAGNOSIS — I4891 Unspecified atrial fibrillation: Secondary | ICD-10-CM | POA: Insufficient documentation

## 2017-05-18 DIAGNOSIS — I509 Heart failure, unspecified: Secondary | ICD-10-CM | POA: Diagnosis not present

## 2017-05-18 DIAGNOSIS — I5022 Chronic systolic (congestive) heart failure: Secondary | ICD-10-CM

## 2017-05-18 DIAGNOSIS — Z8673 Personal history of transient ischemic attack (TIA), and cerebral infarction without residual deficits: Secondary | ICD-10-CM | POA: Diagnosis not present

## 2017-05-18 MED ORDER — PREDNISONE 10 MG (21) PO TBPK: ORAL_TABLET | ORAL | 0 refills | Status: DC

## 2017-05-18 NOTE — Patient Instructions (Signed)
Continue weighing daily and call for an overnight weight gain of > 2 pounds or a weekly weight gain of >5 pounds. 

## 2017-05-18 NOTE — Progress Notes (Addendum)
Agree with pharmacist note below.  Physical exam and ROS were done by myself.  Wearing her oxygen at home but didn't wear it out today. Has noticed a recurrence of her wheezing, cough and shortness of breath. Will place patient on a prednisone taper and a follow-up appointment was made with her pulmonologist (Kasa) for 05/20/17.  Return here in 3 months or sooner for any questions/problems before then.  Darylene Price, FNP [facility]    Patient ID: Jenny Cooper, female    DOB: 04/23/1936, 81 y.o.   MRN: [id number]  HPI  Jenny Cooper is a 81 y/o female with a history of atrial fibrillation (cardioversion), MI, lung cancer, hypothyroidism, HTN, hyperlipidemia, blood clots, GERD, COPD, spinal compression, previous tobacco use and chronic heart failure.   Echo done 04/06/17 reviewed and shows an EF of 30-35% along with trivial AR, moderate MR and moderately elevated PA pressure of 50 mm Hg.  Cardiac catheterization done 04/28/15 shows severe underlying three-vessel CAD with occluded SVG likely to OM. No good options for revascularization. Native RCA is occluded with left-to-right collaterals. Recommended optimizing medical therapy.   Admitted 04/06/17 due to pulmonary edema. Initially needed bipap and then transitioned to nasal cannula. Given IV diuretics initially and then transitioned to oral diuretics. Cardiology consult obtained. Cardioverted and maintained in NSR. Discharged home after 2 days.   Jenny Cooper presents today for her follow up visit with a chief complaint of increasing shortness of breath and fatigue to the point of having to sleep for long periods of time in the day. Patient states Jenny Cooper finished a prednisone taper about 1 month ago for shortness of breath. Jenny Cooper describes this as progressively worsening over the last 2 weeks. Denies chest pain or edema.   Past Medical History:  Diagnosis Date  . Atypical atrial flutter ([place])    a. s/p ablation 07/27/2013 followed by Dr. Rockey Situ  . CAD  (coronary artery disease)    a. s/p MI x 2 in 2002 s/p PCI x 2 in 2002; b. s/p 2v CABG 2002; c. stress echo 07/2004 w/ evi of pos & inf infarct & no evi of ischemia; d. 4/08 dipyridamole scan w/ multiple areas of infarct, no ischemia, EF 49%; e. cath 04/28/15 3v CAD, med Rx rec, no targets for revasc, LM lum irregs, pLAD 30%, 100%, ost-pLCx 60%, [name] 99%, OM2 100%, p-mRCA 90%, [name] 100% L-R collats, VG-mLAD irregs, VG-OM2 oc  . Carcinoma of right lung ([place]) 01/03/2015   a. followed by Dr. Oliva Bustard  . Chronic systolic CHF (congestive heart failure) ([place])    a. echo 03/2015: EF 30-35%, sev ant/inf/pos HK, in mild to mod MR  . Complication of anesthesia    more recently patient oxygen levels do not rebound as quickly  . Compressed spine fracture ([place]) 08/06/2016   lumbar 2, t11, t12  . COPD (chronic obstructive pulmonary disease) ([place])   . Fractured pelvis ([place]) 05/26/2016   2 places  . GERD (gastroesophageal reflux disease)   . History of blood clots    12/2001  . History of colonoscopy 2013  . History of mammography, screening 2015  . History of Papanicolaou smear of cervix 2013  . HLD (hyperlipidemia)   . HTN (hypertension)   . Hypothyroidism   . Lung cancer ([place])   . Mitral regurgitation    a. s/p mitral ring placement 09/2000; b. echo 09/2010: EF 50%, inf HK, post AK, mild MR, prosthetic mitral valve ring w/ peak gradient of 10 mmHg; b.  echo 2/13: EF 50%, mild MR/TR     . Myocardial infarction ([place])    X 2 (LAST ONE IN 2002)  . Neuropathy   . Pacemaker    a. MDT 2002; b. generator replacement 2013; c. followed by Dr. Omelia Blackwater, MD  . PAF (paroxysmal atrial fibrillation) ([place])    a. on Eliquis   . Personal history of chemotherapy   . Personal history of radiation therapy    Past Surgical History:  Procedure Laterality Date  . ABLATION  04/2016   [facility]  . APPENDECTOMY    . CARDIAC CATHETERIZATION N/A 04/28/2015   Procedure: Left Heart Cath and Coronary Angiography;  Surgeon: Wellington Hampshire, MD;  Location: [place];  Service: Cardiovascular;  Laterality: N/A;  . CARDIOVERSION N/A 04/08/2017   Procedure: CARDIOVERSION;  Surgeon: Wellington Hampshire, MD;  Location: [facility];  Service: Cardiovascular;  Laterality: N/A;  . COLONOSCOPY  12/2009   2 small tubular adenomas  . CORONARY ARTERY BYPASS GRAFT  09/2000  . ECTOPIC PREGNANCY SURGERY    . ELECTROMAGNETIC NAVIGATION BROCHOSCOPY N/A 10/06/2015   Procedure: ELECTROMAGNETIC NAVIGATION BRONCHOSCOPY;  Surgeon: Flora Lipps, MD;  Location: [facility];  Service: Cardiopulmonary;  Laterality: N/A;  . ENDOBRONCHIAL ULTRASOUND N/A 10/06/2015   Procedure: ENDOBRONCHIAL ULTRASOUND;  Surgeon: Flora Lipps, MD;  Location: [facility];  Service: Cardiopulmonary;  Laterality: N/A;  . KYPHOPLASTY N/A 09/28/2016   Procedure: KYPHOPLASTY;  Surgeon: Hessie Knows, MD;  Location: [facility];  Service: Orthopedics;  Laterality: N/A;  . PACEMAKER INSERTION  03/2012  . thorocentesis  12/24/2016  . VAGINAL HYSTERECTOMY     partial - left ovary remains   Family History  Problem Relation Age of Onset  . COPD Mother        sister, and brother  . Stroke Maternal Grandmother   . Hypertension Sister   . Hypertension Brother   . Colon cancer Brother        age 73  . Heart attack Neg Hx   . Breast cancer Neg Hx    Social History  Substance Use Topics  . Smoking status: Former Smoker    Packs/day: 2.00    Years: 42.00    Types: Cigarettes    Quit date: 08/30/2000  . Smokeless tobacco: Never Used     Comment: quit smoking in 08/28/2000  . Alcohol use 6.0 oz/week    10 Glasses of wine per week     Comment: 2 glasses of wine per day   Allergies  Allergen Reactions  . Lovenox [Enoxaparin Sodium] Itching  . Meperidine Other (See Comments)    Other Reaction: pt does not like how it makes her feel   Prior to Admission medications   Medication Sig Start Date End Date Taking? Authorizing Provider  acetaminophen (TYLENOL) 500 MG tablet Take 1,000 mg  by mouth every 6 (six) hours.    Yes [provider]  amiodarone (PACERONE) 200 MG tablet Take 200 mg by mouth 2 (two) times daily.   Yes [provider]  apixaban (ELIQUIS) 5 MG TABS tablet Take 5 mg by mouth 2 (two) times daily.  08/04/16  Yes [provider]  atorvastatin (LIPITOR) 20 MG tablet TAKE 1 TABLET (20 MG TOTAL) BY MOUTH AT BEDTIME. 10/26/16  Yes Glean Hess, MD  Calcium Carb-Cholecalciferol (CALCIUM 600-D PO) Take 1 tablet by mouth every morning.    Yes [provider]  Cetirizine HCl 10 MG CAPS Take 1 capsule (10 mg total) by  mouth 1 day or 1 dose. Patient taking differently: Take 1 capsule by mouth daily.  05/19/15  Yes Dunn, Areta Haber, PA-C  furosemide (LASIX) 40 MG tablet Take 1 tablet (40 mg total) by mouth 2 (two) times daily. 04/08/17  Yes Demetrios Loll, MD  levothyroxine (SYNTHROID, LEVOTHROID) 75 MCG tablet TAKE 1 TABLET (75 MCG TOTAL) BY MOUTH DAILY BEFORE BREAKFAST. 11/22/16  Yes Glean Hess, MD  metoprolol succinate (TOPROL-XL) 25 MG 24 hr tablet TAKE 1 TABLET BY MOUTH EVERY MORNING AND 2 TABS IN THE EVENING 03/21/17  Yes Gollan, Kathlene November, MD  predniSONE (DELTASONE) 10 MG tablet Take 1 tablet (10 mg total) by mouth daily with breakfast. 04/01/17  Yes Kasa, Maretta Bees, MD  sacubitril-valsartan (ENTRESTO) 24-26 MG Take 1 tablet by mouth 2 (two) times daily. 01/10/17  Yes Minna Merritts, MD  senna (SENOKOT) 8.6 MG TABS tablet Take 1 tablet (8.6 mg total) by mouth daily. 05/27/16  Yes Veronese, [place], MD  umeclidinium bromide (INCRUSE ELLIPTA) 62.5 MCG/INH AEPB Inhale 1 puff into the lungs daily. 02/09/17  Yes Flora Lipps, MD    Review of Systems  Constitutional: Positive for fatigue. Negative for appetite change   HENT: Positive for rhinorrhea. Negative for congestion and sore throat.   Eyes: Negative.   Respiratory: Positive for shortness of breath and cough. Negative for chest tightness.   Cardiovascular: Negative for chest pain,  palpitations and leg swelling.  Gastrointestinal: Negative for abdominal distention and abdominal pain.  Endocrine: Negative.   Genitourinary: Negative.   Musculoskeletal: Negative for back pain and neck pain.  Skin: Negative.   Allergic/Immunologic: Negative.   Neurological: Negative for dizziness and light-headedness.  Hematological: Negative for adenopathy. Does not bruise/bleed easily.  Psychiatric/Behavioral: Negative for dysphoric mood and sleep disturbance (sleeping on 1 pillow with oxygen on ). The patient is not nervous/anxious.    Vitals:   05/18/17 1123  BP: (!) 111/51  Pulse: 64  Resp: 18  SpO2: 93%  Weight: 163 lb 4 oz (74 kg)  Height: 5\' 6"  (1.676 m)   Wt Readings from Last 3 Encounters:  05/18/17 163 lb 4 oz (74 kg)  05/10/17 160 lb 0.9 oz (72.6 kg)  04/22/17 163 lb 8 oz (74.2 kg)   Lab Results  Component Value Date   CREATININE 1.20 (H) 04/22/2017   CREATININE 0.98 04/08/2017   CREATININE 1.16 (H) 04/07/2017   Physical Exam  Constitutional: Jenny Cooper is oriented to person, place, and time. Jenny Cooper appears well-developed and well-nourished.  HENT:  Head: Normocephalic and atraumatic.  Neck: Normal range of motion. Neck supple. No JVD present.  Cardiovascular: Normal rate and regular rhythm.   Pulmonary/Chest: Effort normal. Jenny Cooper has few wheezes bilateral lower lobes.Jenny Cooper has no rales.  Abdominal: Soft. Jenny Cooper exhibits no distension. There is no tenderness.  Musculoskeletal: Jenny Cooper exhibits no edema or tenderness.  Neurological: Jenny Cooper is alert and oriented to person, place, and time.  Skin: Skin is warm and dry.  Psychiatric: Jenny Cooper has a normal mood and affect. Her behavior is normal.  Nursing note and vitals reviewed.  Assessment & Plan:   1: Chronic heart failure with reduced ejection fraction- - NYHA class II - euvolemic - weighing daily and Jenny Cooper was instructed to call for an overnight weight gain of >2 pounds or a weekly weight gain of >5 pounds - Discussed the  importance of not adding any salt so that Jenny Cooper can follow a 2000mg  sodium diet. Patient is already reading food labels for sodium content.  -  Patient says fatigue has gotten progressively worse the last 2 weeks; Completed prednisone taper about 1 moth ago and patient states the prednisone gave her energy.  - may be difficult to titrate entresto due to BP; if we do, will probably need to then decrease diuretic - Saw cardiologist Rockey Situ) 04/22/17  2: Atrial fibrillation- - currently in NSR - on amiodarone, apixaban; reviewed signs and symptoms of bleeding and when to seek medical attention   3: HTN- - BP on the low side but without dizziness - saw PCP Army Melia) 04/21/17 and has follow up appointment scheduled on 08/25/17  4: COPD- - Progressively worsening cough; NP gave patient prescription for Prednisone DS 6 day pack  - Has follow up with Kasa (05/20/17)   Patient did not bring in medication bottles, so medication list was verbally reviewed.   Return in 3 months or sooner for any questions/problems before then.   Candelaria Stagers, PharmD  Pharmacy Resident  05/18/17

## 2017-05-20 ENCOUNTER — Encounter: Payer: Self-pay | Admitting: Internal Medicine

## 2017-05-20 ENCOUNTER — Ambulatory Visit (INDEPENDENT_AMBULATORY_CARE_PROVIDER_SITE_OTHER): Payer: Medicare Other | Admitting: Internal Medicine

## 2017-05-20 VITALS — BP 108/64 | HR 80 | Resp 16 | Ht 66.0 in | Wt 164.0 lb

## 2017-05-20 DIAGNOSIS — I2510 Atherosclerotic heart disease of native coronary artery without angina pectoris: Secondary | ICD-10-CM | POA: Diagnosis not present

## 2017-05-20 DIAGNOSIS — J449 Chronic obstructive pulmonary disease, unspecified: Secondary | ICD-10-CM

## 2017-05-20 MED ORDER — GUAIFENESIN-CODEINE 100-10 MG/5ML PO SOLN: 5.0000 mL | ORAL | 0 refills | Status: DC | PRN

## 2017-05-20 NOTE — Progress Notes (Signed)
Date: 05/20/2017  MRN# [id number] Jenny Cooper Jul 14, 1936  PMD - Dr. Gayland Curry Jenny Cooper is a 81 y.o. old female seen in follow up for new LLL mass.   CC Follow up cough and SOB  Synopsis - 81 year old female first evaluated by pulmonary in early 2016 for chronic cough, found to have right hilar mass, biopsy of mass and pathology specimens positive for squamous cell, stage IIIa. Now status post chemoradiation, recently found to have left lower lobe mass, with chronic cough, chronic antibiotics. Biopsy, EBUS, ENB, of left lower lobe mass negative for malignancy   chronic cough, along with postradiation fibrosis. 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 Status post bronchoscopy in February 2017-negative for malignancy; May 5th PET scan shows no significant concerns for recurrent malignancy; shows radiation changes; pleural effusion # Clinically no evidence of progression at this time. Would recommend a follow-up CT scan of the chest with contrast in 2 months. # Recent Right pleural effusion [may 2018]- status post thoracentesis with symptomatic improvement; likely from CHF. Currently resolved.   HPI +cough and increased wheezing Patient respirations symptoms previously improved with BREO 200 andINCRUSE  she has also had an abalation for A.fib Uses albuterol as needed Has chronic SOB and DOE Her 6 minute walk test was within normal limits Her ONO was positive for hypoxia and is on 2 L nasal cannula at night   No obvious signs of infection at this time No obvious signs of acute heart failure at this time  Allergies:  Lovenox [enoxaparin sodium] and Meperidine  Review of Systems: Gen:  Denies  fever, sweats, chills HEENT: Denies blurred vision, double vision, ear pain, eye pain, hearing loss, nose bleeds, sore throat. Mild runny nose mostly in the mornings Cvc:  No  dizziness, chest pain or heaviness Resp:  Sob, mild wheezing, cough - mainly non productive.  Gi: Denies swallowing difficulty, stomach pain, nausea or vomiting, diarrhea, constipation, bowel incontinence Other:  Right hand and foot numbness  Physical Examination:  BP 108/64 (BP Location: Left Arm, Cuff Size: Normal)   Pulse 80   Resp 16   Ht 5\' 6"  (1.676 m)   Wt 164 lb (74.4 kg)   SpO2 90%   BMI 26.47 kg/m    General Appearance: No distress  Neuro:without focal findings, mental status, speech normal, alert and oriented, cranial nerves 2-12 intact, reflexes normal and symmetric, sensation grossly normal - right/left hand - sensation intact, strength 5/5 HEENT: PERRLA, EOM intact, no ptosis, no other lesions noticed; Mallampati 2 Pulmonary: coarse upper airway sounds,no wheezing noted on exam today, good airway entry throughout    Sputum Production:  none CardiovascularNormal S1,S2.  No m/r/g.  Abdominal aorta pulsation normal.      ECHO 03/2014 MILD LV DYSFUNCTION  NORMAL RIGHT VENTRICULAR SYSTOLIC FUNCTION VALVULAR REGURGITATION: MILD MR, TRIVIAL PR, MILD TR PROSTHETIC VALVE(S): PROSTHETIC MV RING    Assessment and Plan:  81 year old female past medical history of chronic cough, moderate Gold stage B COPD, stage IIIa non-small cell lung cancer, status post chemoradiation, seen in follow-up visit for recurrent cough, suspected postradiation pneumonitis/fibrosis with allergic rhinitis with underlying afib with CHF with chronic hypoxic respiratory failure I have recommended her restarting her BREO 200 instead of starting prednisone pack therapy-patient was very satisfied with this plan of care     Radiation pneumonitis (HCC) -No prednisone therapy at this time will  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 in the next 3-6 months with regard to prednisone therapy  Cough Multifactorial: Postradiation pneumonitis, fibrosis, architectural distortion, anatomical distortion of the right  mainstem, right hilum inflammation, left lower lobe mass, COPD, Patient will likely have chronic cough, and some level of chronic dyspnea level the persistent post radiation fibrosis in the right lung I recommend restarting BREO 200  COPD -will continue Incruse and assess resp status in 6 months -restart  BREO 200   Chronic hypoxic respiratory failure -Continue 2 L oxygen nasal cannula at nighttime and with exertion  CHF afib  continue anticoagulation Follow up cardiology  Follow up in 6 months  Patient satisfied with Plan of action and management. All questions answered  Corrin Parker, M.D.  [facility]  Medical Director [place] [facility] Cardio-Pulmonary Department

## 2017-05-20 NOTE — Patient Instructions (Addendum)
Recommend restarting BREO 200 daily -please rinse mouth out after use ContinueINCRUSE as prescribed Continue oxygen at nighttime Robitussin with codeine as needed Follow-up CT chest with oncology

## 2017-05-23 ENCOUNTER — Encounter: Payer: Medicare Other | Attending: Cardiovascular Disease | Admitting: *Deleted

## 2017-05-23 ENCOUNTER — Encounter: Payer: Self-pay | Admitting: *Deleted

## 2017-05-23 ENCOUNTER — Other Ambulatory Visit: Payer: Self-pay | Admitting: Internal Medicine

## 2017-05-23 VITALS — Ht 65.0 in | Wt 164.3 lb

## 2017-05-23 DIAGNOSIS — Z87891 Personal history of nicotine dependence: Secondary | ICD-10-CM | POA: Diagnosis not present

## 2017-05-23 DIAGNOSIS — E039 Hypothyroidism, unspecified: Secondary | ICD-10-CM | POA: Insufficient documentation

## 2017-05-23 DIAGNOSIS — J449 Chronic obstructive pulmonary disease, unspecified: Secondary | ICD-10-CM | POA: Insufficient documentation

## 2017-05-23 DIAGNOSIS — Z950 Presence of cardiac pacemaker: Secondary | ICD-10-CM | POA: Diagnosis not present

## 2017-05-23 DIAGNOSIS — Z951 Presence of aortocoronary bypass graft: Secondary | ICD-10-CM | POA: Diagnosis not present

## 2017-05-23 DIAGNOSIS — Z79899 Other long term (current) drug therapy: Secondary | ICD-10-CM | POA: Diagnosis not present

## 2017-05-23 DIAGNOSIS — K219 Gastro-esophageal reflux disease without esophagitis: Secondary | ICD-10-CM | POA: Diagnosis not present

## 2017-05-23 DIAGNOSIS — I480 Paroxysmal atrial fibrillation: Secondary | ICD-10-CM | POA: Diagnosis not present

## 2017-05-23 DIAGNOSIS — Z7901 Long term (current) use of anticoagulants: Secondary | ICD-10-CM | POA: Diagnosis not present

## 2017-05-23 DIAGNOSIS — E785 Hyperlipidemia, unspecified: Secondary | ICD-10-CM | POA: Diagnosis not present

## 2017-05-23 DIAGNOSIS — I2510 Atherosclerotic heart disease of native coronary artery without angina pectoris: Secondary | ICD-10-CM | POA: Diagnosis not present

## 2017-05-23 DIAGNOSIS — I5022 Chronic systolic (congestive) heart failure: Secondary | ICD-10-CM | POA: Diagnosis present

## 2017-05-23 DIAGNOSIS — I110 Hypertensive heart disease with heart failure: Secondary | ICD-10-CM | POA: Insufficient documentation

## 2017-05-23 DIAGNOSIS — I483 Typical atrial flutter: Secondary | ICD-10-CM | POA: Insufficient documentation

## 2017-05-23 DIAGNOSIS — I252 Old myocardial infarction: Secondary | ICD-10-CM | POA: Diagnosis not present

## 2017-05-23 NOTE — Patient Instructions (Signed)
Patient Instructions  Patient Details  Name: Jenny Cooper MRN: [id number] Date of Birth: 04-21-1936 Referring Provider:  Minna Merritts, MD  Below are the personal goals you chose as well as exercise and nutrition goals. Our goal is to help you keep on track towards obtaining and maintaining your goals. We will be discussing your progress on these goals with you throughout the program.  Initial Exercise Prescription:     Initial Exercise Prescription - 05/23/17 1600      Date of Initial Exercise RX and Referring Provider   Date 05/23/17   Referring Provider Ida Rogue MD     Oxygen   Oxygen Continuous   Liters 2     Treadmill   MPH 1.5   Grade 0   Minutes 15   METs 2.15     NuStep   Level 1   SPM 80   Minutes 15   METs 1.5     Arm Ergometer   Level 1   Watts 12   RPM 50   Minutes 15   METs 1.5     Prescription Details   Frequency (times per week) 3   Duration Progress to 45 minutes of aerobic exercise without signs/symptoms of physical distress     Intensity   THRR 40-80% of Max Heartrate 94-125   Ratings of Perceived Exertion 11-13   Perceived Dyspnea 0-4     Progression   Progression Continue to progress workloads to maintain intensity without signs/symptoms of physical distress.     Resistance Training   Training Prescription Yes   Weight 3 lbs   Reps 10-15      Exercise Goals: Frequency: Be able to perform aerobic exercise three times per week working toward 3-5 days per week.  Intensity: Work with a perceived exertion of 11 (fairly light) - 15 (hard) as tolerated. Follow your new exercise prescription and watch for changes in prescription as you progress with the program. Changes will be reviewed with you when they are made.  Duration: You should be able to do 30 minutes of continuous aerobic exercise in addition to a 5 minute warm-up and a 5 minute cool-down routine.  Nutrition Goals: Your personal nutrition goals will be  established when you do your nutrition analysis with the dietician.  The following are nutrition guidelines to follow: Cholesterol < 200mg /day Sodium < 1500mg /day Fiber: Women over 50 yrs - 21 grams per day  Personal Goals:     Personal Goals and Risk Factors at Admission - 05/23/17 1348      Core Components/Risk Factors/Patient Goals on Admission    Weight Management Yes;Weight Loss   Intervention Weight Management: Develop a combined nutrition and exercise program designed to reach desired caloric intake, while maintaining appropriate intake of nutrient and fiber, sodium and fats, and appropriate energy expenditure required for the weight goal.;Weight Management: Provide education and appropriate resources to help participant work on and attain dietary goals.;Weight Management/Obesity: Establish reasonable short term and long term weight goals.   Admit Weight 164 lb 4.8 oz (74.5 kg)   Goal Weight: Short Term 132 lb (59.9 kg)   Goal Weight: Long Term 148 lb (67.1 kg)   Expected Outcomes Short Term: Continue to assess and modify interventions until short term weight is achieved;Long Term: Adherence to nutrition and physical activity/exercise program aimed toward attainment of established weight goal;Weight Loss: Understanding of general recommendations for a balanced deficit meal plan, which promotes 1-2 lb weight loss per week and includes a  negative energy balance of [id number] kcal/d;Understanding recommendations for meals to include 15-35% energy as protein, 25-35% energy from fat, 35-60% energy from carbohydrates, less than 200mg  of dietary cholesterol, 20-35 gm of total fiber daily;Understanding of distribution of calorie intake throughout the day with the consumption of 4-5 meals/snacks   Heart Failure Yes   Intervention Provide a combined exercise and nutrition program that is supplemented with education, support and counseling about heart failure. Directed toward relieving symptoms such as  shortness of breath, decreased exercise tolerance, and extremity edema.   Expected Outcomes Improve functional capacity of life;Short term: Attendance in program 2-3 days a week with increased exercise capacity. Reported lower sodium intake. Reported increased fruit and vegetable intake. Reports medication compliance.;Short term: Daily weights obtained and reported for increase. Utilizing diuretic protocols set by physician.;Long term: Adoption of self-care skills and reduction of barriers for early signs and symptoms recognition and intervention leading to self-care maintenance.   Hypertension Yes   Intervention Provide education on lifestyle modifcations including regular physical activity/exercise, weight management, moderate sodium restriction and increased consumption of fresh fruit, vegetables, and low fat dairy, alcohol moderation, and smoking cessation.;Monitor prescription use compliance.   Expected Outcomes Short Term: Continued assessment and intervention until BP is < 140/109mm HG in hypertensive participants. < 130/58mm HG in hypertensive participants with diabetes, heart failure or chronic kidney disease.;Long Term: Maintenance of blood pressure at goal levels.   Lipids Yes   Intervention Provide education and support for participant on nutrition & aerobic/resistive exercise along with prescribed medications to achieve LDL 70mg , HDL >40mg .   Expected Outcomes Short Term: Participant states understanding of desired cholesterol values and is compliant with medications prescribed. Participant is following exercise prescription and nutrition guidelines.;Long Term: Cholesterol controlled with medications as prescribed, with individualized exercise RX and with personalized nutrition plan. Value goals: LDL < 70mg , HDL > 40 mg.      Tobacco Use Initial Evaluation: History  Smoking Status  . Former Smoker  . Packs/day: 2.00  . Years: 42.00  . Types: Cigarettes  . Quit date: 08/30/2000   Smokeless Tobacco  . Never Used    Comment: quit smoking in 08/28/2000    Exercise Goals and Review:     Exercise Goals    Row Name 05/23/17 1628             Exercise Goals   Increase Physical Activity Yes       Intervention Provide advice, education, support and counseling about physical activity/exercise needs.;Develop an individualized exercise prescription for aerobic and resistive training based on initial evaluation findings, risk stratification, comorbidities and participant's personal goals.       Expected Outcomes Achievement of increased cardiorespiratory fitness and enhanced flexibility, muscular endurance and strength shown through measurements of functional capacity and personal statement of participant.       Increase Strength and Stamina Yes       Intervention Provide advice, education, support and counseling about physical activity/exercise needs.;Develop an individualized exercise prescription for aerobic and resistive training based on initial evaluation findings, risk stratification, comorbidities and participant's personal goals.       Expected Outcomes Achievement of increased cardiorespiratory fitness and enhanced flexibility, muscular endurance and strength shown through measurements of functional capacity and personal statement of participant.       Able to understand and use rate of perceived exertion (RPE) scale Yes       Intervention Provide education and explanation on how to use RPE scale  Expected Outcomes Short Term: Able to use RPE daily in rehab to express subjective intensity level;Long Term:  Able to use RPE to guide intensity level when exercising independently       Able to understand and use Dyspnea scale Yes       Intervention Provide education and explanation on how to use Dyspnea scale       Expected Outcomes Short Term: Able to use Dyspnea scale daily in rehab to express subjective sense of shortness of breath during exertion;Long Term: Able  to use Dyspnea scale to guide intensity level when exercising independently       Knowledge and understanding of Target Heart Rate Range (THRR) Yes       Intervention Provide education and explanation of THRR including how the numbers were predicted and where they are located for reference       Expected Outcomes Short Term: Able to state/look up THRR;Long Term: Able to use THRR to govern intensity when exercising independently;Short Term: Able to use daily as guideline for intensity in rehab       Able to check pulse independently Yes       Intervention Provide education and demonstration on how to check pulse in carotid and radial arteries.;Review the importance of being able to check your own pulse for safety during independent exercise       Expected Outcomes Short Term: Able to explain why pulse checking is important during independent exercise;Long Term: Able to check pulse independently and accurately       Understanding of Exercise Prescription Yes       Intervention Provide education, explanation, and written materials on patient's individual exercise prescription       Expected Outcomes Short Term: Able to explain program exercise prescription;Long Term: Able to explain home exercise prescription to exercise independently          Copy of goals given to participant.

## 2017-05-23 NOTE — Progress Notes (Signed)
Cardiac Individual Treatment Plan  Patient Details  Name: Jenny Cooper MRN: [id number] Date of Birth: 1935-12-09 Referring Provider:     Cardiac Rehab from 05/23/2017 in [facility] Cardiac and Pulmonary Rehab  Referring Provider  Ida Rogue MD      Initial Encounter Date:    Cardiac Rehab from 05/23/2017 in [facility] Cardiac and Pulmonary Rehab  Date  05/23/17  Referring Provider  Ida Rogue MD      Visit Diagnosis: Heart failure, chronic systolic ([place])  Patient's Home Medications on Admission:  Current Outpatient Prescriptions:  .  acetaminophen (TYLENOL) 500 MG tablet, Take 1,000 mg by mouth every 6 (six) hours. , Disp: , Rfl:  .  amiodarone (PACERONE) 200 MG tablet, Take 200 mg by mouth daily. , Disp: , Rfl:  .  apixaban (ELIQUIS) 5 MG TABS tablet, Take 5 mg by mouth 2 (two) times daily. , Disp: , Rfl:  .  atorvastatin (LIPITOR) 20 MG tablet, TAKE 1 TABLET (20 MG TOTAL) BY MOUTH AT BEDTIME., Disp: 30 tablet, Rfl: 5 .  Calcium Carb-Cholecalciferol (CALCIUM 600-D PO), Take 1 tablet by mouth every morning. , Disp: , Rfl:  .  Cetirizine HCl 10 MG CAPS, Take 1 capsule (10 mg total) by mouth 1 day or 1 dose. (Patient taking differently: Take 1 capsule by mouth daily. ), Disp: 30 capsule, Rfl: 5 .  furosemide (LASIX) 40 MG tablet, Take 1 tablet (40 mg total) by mouth 2 (two) times daily. (Patient taking differently: Take 40 mg by mouth 2 (two) times daily. Except on Sunday.), Disp: 60 tablet, Rfl: 6 .  guaiFENesin-codeine 100-10 MG/5ML syrup, Take 5 mLs by mouth every 4 (four) hours as needed for cough., Disp: 240 mL, Rfl: 0 .  ipratropium-albuterol (DUONEB) 0.5-2.5 (3) MG/3ML SOLN, Take 3 mLs by nebulization every 6 (six) hours as needed., Disp: , Rfl:  .  levothyroxine (SYNTHROID, LEVOTHROID) 75 MCG tablet, TAKE 1 TABLET (75 MCG TOTAL) BY MOUTH DAILY BEFORE BREAKFAST., Disp: 30 tablet, Rfl: 5 .  metoprolol succinate (TOPROL-XL) 25 MG 24 hr tablet, TAKE 1 TABLET BY MOUTH EVERY MORNING  AND 2 TABS IN THE EVENING, Disp: 90 tablet, Rfl: 3 .  sacubitril-valsartan (ENTRESTO) 24-26 MG, Take 1 tablet by mouth 2 (two) times daily., Disp: 60 tablet, Rfl: 6 .  senna (SENOKOT) 8.6 MG TABS tablet, Take 1 tablet (8.6 mg total) by mouth daily., Disp: 30 each, Rfl: 0 .  umeclidinium bromide (INCRUSE ELLIPTA) 62.5 MCG/INH AEPB, Inhale 1 puff into the lungs daily., Disp: 1 each, Rfl: 0 .  predniSONE (STERAPRED UNI-PAK 21 TAB) 10 MG (21) TBPK tablet, Take as directed with food. (Patient not taking: Reported on 05/23/2017), Disp: 21 tablet, Rfl: 0 No current facility-administered medications for this visit.   Facility-Administered Medications Ordered in Other Visits:  .  sodium chloride 0.9 % injection 10 mL, 10 mL, Intravenous, PRN, Choksi, Janak, MD, 10 mL at 02/19/15 1000 .  sodium chloride flush (NS) 0.9 % injection 10 mL, 10 mL, Intravenous, PRN, Cammie Sickle, MD, 10 mL at 02/16/16 1048  Past Medical History: Past Medical History:  Diagnosis Date  . Atypical atrial flutter ([place])    a. s/p ablation 07/27/2013 followed by Dr. Rockey Situ  . CAD (coronary artery disease)    a. s/p MI x 2 in 2002 s/p PCI x 2 in 2002; b. s/p 2v CABG 2002; c. stress echo 07/2004 w/ evi of pos & inf infarct & no evi of ischemia; d. 4/08 dipyridamole scan w/ multiple  areas of infarct, no ischemia, EF 49%; e. cath 04/28/15 3v CAD, med Rx rec, no targets for revasc, LM lum irregs, pLAD 30%, 100%, ost-pLCx 60%, [name] 99%, OM2 100%, p-mRCA 90%, [name] 100% L-R collats, VG-mLAD irregs, VG-OM2 oc  . Carcinoma of right lung ([place]) 01/03/2015   a. followed by Dr. Oliva Bustard  . Chronic systolic CHF (congestive heart failure) ([place])    a. echo 03/2015: EF 30-35%, sev ant/inf/pos HK, in mild to mod MR  . Complication of anesthesia    more recently patient oxygen levels do not rebound as quickly  . Compressed spine fracture ([place]) 08/06/2016   lumbar 2, t11, t12  . COPD (chronic obstructive pulmonary disease) ([place])   . Fractured  pelvis ([place]) 05/26/2016   2 places  . GERD (gastroesophageal reflux disease)   . History of blood clots    12/2001  . History of colonoscopy 2013  . History of mammography, screening 2015  . History of Papanicolaou smear of cervix 2013  . HLD (hyperlipidemia)   . HTN (hypertension)   . Hypothyroidism   . Lung cancer ([place])   . Mitral regurgitation    a. s/p mitral ring placement 09/2000; b. echo 09/2010: EF 50%, inf HK, post AK, mild MR, prosthetic mitral valve ring w/ peak gradient of 10 mmHg; b. echo 2/13: EF 50%, mild MR/TR     . Myocardial infarction ([place])    X 2 (LAST ONE IN 2002)  . Neuropathy   . Pacemaker    a. MDT 2002; b. generator replacement 2013; c. followed by Dr. Omelia Blackwater, MD  . PAF (paroxysmal atrial fibrillation) ([place])    a. on Eliquis   . Personal history of chemotherapy   . Personal history of radiation therapy     Tobacco Use: History  Smoking Status  . Former Smoker  . Packs/day: 2.00  . Years: 42.00  . Types: Cigarettes  . Quit date: 08/30/2000  Smokeless Tobacco  . Never Used    Comment: quit smoking in 08/28/2000    Labs: Recent Review Flowsheet Data    Labs for ITP Cardiac and Pulmonary Rehab Latest Ref Rng & Units 01/05/2016 02/16/2016 06/29/2016 12/27/2016 04/06/2017   Cholestrol 100 - 199 mg/dL - 128 - - -   LDLCALC 0 - 99 mg/dL - 45 - - -   HDL >39 mg/dL - 56 - - -   Trlycerides 0 - 149 mg/dL - 133 - - -   Hemoglobin A1c 4.8 - 5.6 % 7.5(H) - 6.3(H) 5.3 -   HCO3 20.0 - 28.0 mmol/L - - - - 36.1(H)   ACIDBASEDEF 0.0 - 2.0 mmol/L - - - - -       Exercise Target Goals: Date: 05/23/17  Exercise Program Goal: Individual exercise prescription set with THRR, safety & activity barriers. Participant demonstrates ability to understand and report RPE using BORG scale, to self-measure pulse accurately, and to acknowledge the importance of the exercise prescription.  Exercise Prescription Goal: Starting with aerobic activity 30 plus minutes a day, 3 days  per week for initial exercise prescription. Provide home exercise prescription and guidelines that participant acknowledges understanding prior to discharge.  Activity Barriers & Risk Stratification:     Activity Barriers & Cardiac Risk Stratification - 05/23/17 1337      Activity Barriers & Cardiac Risk Stratification   Activity Barriers Back Problems;Joint Problems;Shortness of Breath;Decreased Ventricular Function;Muscular Weakness;Neck/Spine Problems;Deconditioning   Cardiac Risk Stratification High      6 Minute Walk:  6 Minute Walk    Row Name 05/23/17 1622         6 Minute Walk   Phase Initial     Distance 667 feet     Walk Time 4.9 minutes     # of Rest Breaks 2  21 sec, 45 sec     MPH 1.54     METS 1.19     RPE 17     Perceived Dyspnea  2     VO2 Peak 4.18     Symptoms Yes (comment)     Comments back pain 7-8/10 (chronic), pulling e-cylinder as she left her tank in the car     Resting HR 62 bpm     Resting BP 124/74     Resting Oxygen Saturation  94 %     Exercise Oxygen Saturation  during 6 min walk 91 %     Max Ex. HR 91 bpm     Max Ex. BP 132/70     2 Minute Post BP 120/60       Interval HR   1 Minute HR 93     2 Minute HR 91     3 Minute HR 89     4 Minute HR 88     5 Minute HR 97     6 Minute HR 78     2 Minute Post HR 87     Interval Heart Rate? Yes       Interval Oxygen   Interval Oxygen? Yes     Baseline Oxygen Saturation % 94 %     1 Minute Oxygen Saturation % 91 %     1 Minute Liters of Oxygen 2 L     2 Minute Oxygen Saturation % 91 %     2 Minute Liters of Oxygen 2 L     3 Minute Oxygen Saturation % 95 %     3 Minute Liters of Oxygen 2 L     4 Minute Oxygen Saturation % 95 %     4 Minute Liters of Oxygen 2 L     5 Minute Oxygen Saturation % 95 %     5 Minute Liters of Oxygen 2 L     6 Minute Oxygen Saturation % 95 %     6 Minute Liters of Oxygen 2 L     2 Minute Post Oxygen Saturation % 91 %     2 Minute Post Liters of Oxygen  2 L        Oxygen Initial Assessment:     Oxygen Initial Assessment - 05/23/17 1337      Intervention   Short Term Goals To learn and demonstrate proper use of respiratory medications      Oxygen Re-Evaluation:   Oxygen Discharge (Final Oxygen Re-Evaluation):   Initial Exercise Prescription:     Initial Exercise Prescription - 05/23/17 1600      Date of Initial Exercise RX and Referring Provider   Date 05/23/17   Referring Provider Ida Rogue MD     Oxygen   Oxygen Continuous   Liters 2     Treadmill   MPH 1.5   Grade 0   Minutes 15   METs 2.15     NuStep   Level 1   SPM 80   Minutes 15   METs 1.5     Arm Ergometer   Level 1   Watts 12   RPM 50   Minutes 15  METs 1.5     Prescription Details   Frequency (times per week) 3   Duration Progress to 45 minutes of aerobic exercise without signs/symptoms of physical distress     Intensity   THRR 40-80% of Max Heartrate 94-125   Ratings of Perceived Exertion 11-13   Perceived Dyspnea 0-4     Progression   Progression Continue to progress workloads to maintain intensity without signs/symptoms of physical distress.     Resistance Training   Training Prescription Yes   Weight 3 lbs   Reps 10-15      Perform Capillary Blood Glucose checks as needed.  Exercise Prescription Changes:     Exercise Prescription Changes    Row Name 05/23/17 1300             Response to Exercise   Blood Pressure (Admit) 124/72       Blood Pressure (Exercise) 132/70       Blood Pressure (Exit) 120/60       Heart Rate (Admit) 62 bpm       Heart Rate (Exercise) 91 bpm       Heart Rate (Exit) 73 bpm       Oxygen Saturation (Admit) 94 %       Oxygen Saturation (Exercise) 95 %       Rating of Perceived Exertion (Exercise) 17       Perceived Dyspnea (Exercise) 2       Symptoms back pain 7-8/10       Comments walk test results         Oxygen   Oxygen Continuous       Liters 2          Exercise  Comments:   Exercise Goals and Review:     Exercise Goals    Row Name 05/23/17 1628             Exercise Goals   Increase Physical Activity Yes       Intervention Provide advice, education, support and counseling about physical activity/exercise needs.;Develop an individualized exercise prescription for aerobic and resistive training based on initial evaluation findings, risk stratification, comorbidities and participant's personal goals.       Expected Outcomes Achievement of increased cardiorespiratory fitness and enhanced flexibility, muscular endurance and strength shown through measurements of functional capacity and personal statement of participant.       Increase Strength and Stamina Yes       Intervention Provide advice, education, support and counseling about physical activity/exercise needs.;Develop an individualized exercise prescription for aerobic and resistive training based on initial evaluation findings, risk stratification, comorbidities and participant's personal goals.       Expected Outcomes Achievement of increased cardiorespiratory fitness and enhanced flexibility, muscular endurance and strength shown through measurements of functional capacity and personal statement of participant.       Able to understand and use rate of perceived exertion (RPE) scale Yes       Intervention Provide education and explanation on how to use RPE scale       Expected Outcomes Short Term: Able to use RPE daily in rehab to express subjective intensity level;Long Term:  Able to use RPE to guide intensity level when exercising independently       Able to understand and use Dyspnea scale Yes       Intervention Provide education and explanation on how to use Dyspnea scale       Expected Outcomes Short Term: Able to use Dyspnea scale  daily in rehab to express subjective sense of shortness of breath during exertion;Long Term: Able to use Dyspnea scale to guide intensity level when exercising  independently       Knowledge and understanding of Target Heart Rate Range (THRR) Yes       Intervention Provide education and explanation of THRR including how the numbers were predicted and where they are located for reference       Expected Outcomes Short Term: Able to state/look up THRR;Long Term: Able to use THRR to govern intensity when exercising independently;Short Term: Able to use daily as guideline for intensity in rehab       Able to check pulse independently Yes       Intervention Provide education and demonstration on how to check pulse in carotid and radial arteries.;Review the importance of being able to check your own pulse for safety during independent exercise       Expected Outcomes Short Term: Able to explain why pulse checking is important during independent exercise;Long Term: Able to check pulse independently and accurately       Understanding of Exercise Prescription Yes       Intervention Provide education, explanation, and written materials on patient's individual exercise prescription       Expected Outcomes Short Term: Able to explain program exercise prescription;Long Term: Able to explain home exercise prescription to exercise independently          Exercise Goals Re-Evaluation :   Discharge Exercise Prescription (Final Exercise Prescription Changes):     Exercise Prescription Changes - 05/23/17 1300      Response to Exercise   Blood Pressure (Admit) 124/72   Blood Pressure (Exercise) 132/70   Blood Pressure (Exit) 120/60   Heart Rate (Admit) 62 bpm   Heart Rate (Exercise) 91 bpm   Heart Rate (Exit) 73 bpm   Oxygen Saturation (Admit) 94 %   Oxygen Saturation (Exercise) 95 %   Rating of Perceived Exertion (Exercise) 17   Perceived Dyspnea (Exercise) 2   Symptoms back pain 7-8/10   Comments walk test results     Oxygen   Oxygen Continuous   Liters 2      Nutrition:  Target Goals: Understanding of nutrition guidelines, daily intake of sodium  1500mg , cholesterol 200mg , calories 30% from fat and 7% or less from saturated fats, daily to have 5 or more servings of fruits and vegetables.  Biometrics:     Pre Biometrics - 05/23/17 1628      Pre Biometrics   Height 5\' 5"  (1.651 m)   Weight 164 lb 4.8 oz (74.5 kg)   Waist Circumference 39 inches   Hip Circumference 38 inches   Waist to Hip Ratio 1.03 %   BMI (Calculated) 27.34   Single Leg Stand 1.02 seconds       Nutrition Therapy Plan and Nutrition Goals:     Nutrition Therapy & Goals - 05/23/17 1340      Intervention Plan   Intervention Prescribe, educate and counsel regarding individualized specific dietary modifications aiming towards targeted core components such as weight, hypertension, lipid management, diabetes, heart failure and other comorbidities.   Expected Outcomes Short Term Goal: Understand basic principles of dietary content, such as calories, fat, sodium, cholesterol and nutrients.;Short Term Goal: A plan has been developed with personal nutrition goals set during dietitian appointment.;Long Term Goal: Adherence to prescribed nutrition plan.      Nutrition Discharge: Rate Your Plate Scores:     Nutrition Assessments - 05/23/17  1340      MEDFICTS Scores   Pre Score 19      Nutrition Goals Re-Evaluation:   Nutrition Goals Discharge (Final Nutrition Goals Re-Evaluation):   Psychosocial: Target Goals: Acknowledge presence or absence of significant depression and/or stress, maximize coping skills, provide positive support system. Participant is able to verbalize types and ability to use techniques and skills needed for reducing stress and depression.   Initial Review & Psychosocial Screening:     Initial Psych Review & Screening - 05/23/17 1340      Initial Review   Current issues with Current Depression;Current Sleep Concerns;Current Stress Concerns  Sleeping 12-14 hours a day. cannot do much of anything she likes to do because of back pain  that occurs after standing for periods of time.    Source of Stress Concerns Chronic Illness;Financial   Comments medications are coasting 300 dollars more now that she has been switched to 2 new meds.  Unable to get coupon pricing since has Medicare.  Physician is helping every now and then with samples. recent back surgery in Dec 2017     [place]? Yes  family     Barriers   Psychosocial barriers to participate in program There are no identifiable barriers or psychosocial needs.;The patient should benefit from training in stress management and relaxation.     Screening Interventions   Interventions Encouraged to exercise;Program counselor consult;Provide feedback about the scores to participant;To provide support and resources with identified psychosocial needs;Yes   Expected Outcomes Short Term goal: Utilizing psychosocial counselor, staff and physician to assist with identification of specific Stressors or current issues interfering with healing process. Setting desired goal for each stressor or current issue identified.;Long Term Goal: Stressors or current issues are controlled or eliminated.;Short Term goal: Identification and review with participant of any Quality of Life or Depression concerns found by scoring the questionnaire.;Long Term goal: The participant improves quality of Life and PHQ9 Scores as seen by post scores and/or verbalization of changes      Quality of Life Scores:      Quality of Life - 05/23/17 1345      Quality of Life Scores   Health/Function Pre 14.79 %   Socioeconomic Pre 12.67 %   Psych/Spiritual Pre 10.93 %   Family Pre 30 %   GLOBAL Pre 15.52 %      PHQ-9: Recent Review Flowsheet Data    Depression screen [facility] 2/9 05/23/2017 05/18/2017 04/14/2017 03/07/2017 08/02/2016   Decreased Interest 1 0 0 0 0   Down, Depressed, Hopeless 1 3 0 0 0   PHQ - 2 Score 2 3 0 0 0   Altered sleeping 1 - - - -   Tired, decreased energy 1 - - -  -   Change in appetite 1 - - - -   Feeling bad or failure about yourself  0 - - - -   Trouble concentrating 0 - - - -   Moving slowly or fidgety/restless 0 - - - -   Suicidal thoughts 0 - - - -   PHQ-9 Score 5 - - - -   Difficult doing work/chores Somewhat difficult - - - -     Interpretation of Total Score  Total Score Depression Severity:  1-4 = Minimal depression, 5-9 = Mild depression, 10-14 = Moderate depression, 15-19 = Moderately severe depression, 20-27 = Severe depression   Psychosocial Evaluation and Intervention:   Psychosocial Re-Evaluation:   Psychosocial  Discharge (Final Psychosocial Re-Evaluation):   Vocational Rehabilitation: Provide vocational rehab assistance to qualifying candidates.   Vocational Rehab Evaluation & Intervention:     Vocational Rehab - 05/23/17 1348      Initial Vocational Rehab Evaluation & Intervention   Assessment shows need for Vocational Rehabilitation No      Education: Education Goals: Education classes will be provided on a variety of topics geared toward better understanding of heart health and risk factor modification. Participant will state understanding/return demonstration of topics presented as noted by education test scores.  Learning Barriers/Preferences:     Learning Barriers/Preferences - 05/23/17 1346      Learning Barriers/Preferences   Learning Barriers Exercise Concerns  concerned about back pain   Learning Preferences None      Education Topics: General Nutrition Guidelines/Fats and Fiber: -Group instruction provided by verbal, written material, models and posters to present the general guidelines for heart healthy nutrition. Gives an explanation and review of dietary fats and fiber.   Controlling Sodium/Reading Food Labels: -Group verbal and written material supporting the discussion of sodium use in heart healthy nutrition. Review and explanation with models, verbal and written materials for  utilization of the food label.   Exercise Physiology & Risk Factors: - Group verbal and written instruction with models to review the exercise physiology of the cardiovascular system and associated critical values. Details cardiovascular disease risk factors and the goals associated with each risk factor.   Aerobic Exercise & Resistance Training: - Gives group verbal and written discussion on the health impact of inactivity. On the components of aerobic and resistive training programs and the benefits of this training and how to safely progress through these programs.   Flexibility, Balance, General Exercise Guidelines: - Provides group verbal and written instruction on the benefits of flexibility and balance training programs. Provides general exercise guidelines with specific guidelines to those with heart or lung disease. Demonstration and skill practice provided.   Stress Management: - Provides group verbal and written instruction about the health risks of elevated stress, cause of high stress, and healthy ways to reduce stress.   Depression: - Provides group verbal and written instruction on the correlation between heart/lung disease and depressed mood, treatment options, and the stigmas associated with seeking treatment.   Anatomy & Physiology of the Heart: - Group verbal and written instruction and models provide basic cardiac anatomy and physiology, with the coronary electrical and arterial systems. Review of: AMI, Angina, Valve disease, Heart Failure, Cardiac Arrhythmia, Pacemakers, and the ICD.   Cardiac Procedures: - Group verbal and written instruction to review commonly prescribed medications for heart disease. Reviews the medication, class of the drug, and side effects. Includes the steps to properly store meds and maintain the prescription regimen. (beta blockers and nitrates)   Cardiac Medications I: - Group verbal and written instruction to review commonly prescribed  medications for heart disease. Reviews the medication, class of the drug, and side effects. Includes the steps to properly store meds and maintain the prescription regimen.   Cardiac Medications II: -Group verbal and written instruction to review commonly prescribed medications for heart disease. Reviews the medication, class of the drug, and side effects. (all other drug classes)    Go Sex-Intimacy & Heart Disease, Get SMART - Goal Setting: - Group verbal and written instruction through game format to discuss heart disease and the return to sexual intimacy. Provides group verbal and written material to discuss and apply goal setting through the application of the S.M.A.R.T.  Method.   Other Matters of the Heart: - Provides group verbal, written materials and models to describe Heart Failure, Angina, Valve Disease, Peripheral Artery Disease, and Diabetes in the realm of heart disease. Includes description of the disease process and treatment options available to the cardiac patient.   Exercise & Equipment Safety: - Individual verbal instruction and demonstration of equipment use and safety with use of the equipment.   Cardiac Rehab from 05/23/2017 in [facility] Cardiac and Pulmonary Rehab  Date  05/23/17  Educator  SB  Instruction Review Code  1- Verbalizes Understanding      Infection Prevention: - Provides verbal and written material to individual with discussion of infection control including proper hand washing and proper equipment cleaning during exercise session.   Cardiac Rehab from 05/23/2017 in [facility] Cardiac and Pulmonary Rehab  Date  05/23/17  Educator  SB  Instruction Review Code  1- Verbalizes Understanding      Falls Prevention: - Provides verbal and written material to individual with discussion of falls prevention and safety.   Cardiac Rehab from 05/23/2017 in [facility] Cardiac and Pulmonary Rehab  Date  05/23/17  Educator  SB  Instruction Review Code  1- Verbalizes  Understanding      Diabetes: - Individual verbal and written instruction to review signs/symptoms of diabetes, desired ranges of glucose level fasting, after meals and with exercise. Acknowledge that pre and post exercise glucose checks will be done for 3 sessions at entry of program.   Other: -Provides group and verbal instruction on various topics (see comments)    Knowledge Questionnaire Score:     Knowledge Questionnaire Score - 05/23/17 1347      Knowledge Questionnaire Score   Pre Score 24/28  reviewed correct responses with Pamala Hurry. she had no further questions and verbalized understanding of the correct responses      Core Components/Risk Factors/Patient Goals at Admission:     Personal Goals and Risk Factors at Admission - 05/23/17 1348      Core Components/Risk Factors/Patient Goals on Admission    Weight Management Yes;Weight Loss   Intervention Weight Management: Develop a combined nutrition and exercise program designed to reach desired caloric intake, while maintaining appropriate intake of nutrient and fiber, sodium and fats, and appropriate energy expenditure required for the weight goal.;Weight Management: Provide education and appropriate resources to help participant work on and attain dietary goals.;Weight Management/Obesity: Establish reasonable short term and long term weight goals.   Admit Weight 164 lb 4.8 oz (74.5 kg)   Goal Weight: Short Term 132 lb (59.9 kg)   Goal Weight: Long Term 148 lb (67.1 kg)   Expected Outcomes Short Term: Continue to assess and modify interventions until short term weight is achieved;Long Term: Adherence to nutrition and physical activity/exercise program aimed toward attainment of established weight goal;Weight Loss: Understanding of general recommendations for a balanced deficit meal plan, which promotes 1-2 lb weight loss per week and includes a negative energy balance of [id number] kcal/d;Understanding recommendations for meals  to include 15-35% energy as protein, 25-35% energy from fat, 35-60% energy from carbohydrates, less than 200mg  of dietary cholesterol, 20-35 gm of total fiber daily;Understanding of distribution of calorie intake throughout the day with the consumption of 4-5 meals/snacks   Heart Failure Yes   Intervention Provide a combined exercise and nutrition program that is supplemented with education, support and counseling about heart failure. Directed toward relieving symptoms such as shortness of breath, decreased exercise tolerance, and extremity edema.   Expected Outcomes  Improve functional capacity of life;Short term: Attendance in program 2-3 days a week with increased exercise capacity. Reported lower sodium intake. Reported increased fruit and vegetable intake. Reports medication compliance.;Short term: Daily weights obtained and reported for increase. Utilizing diuretic protocols set by physician.;Long term: Adoption of self-care skills and reduction of barriers for early signs and symptoms recognition and intervention leading to self-care maintenance.   Hypertension Yes   Intervention Provide education on lifestyle modifcations including regular physical activity/exercise, weight management, moderate sodium restriction and increased consumption of fresh fruit, vegetables, and low fat dairy, alcohol moderation, and smoking cessation.;Monitor prescription use compliance.   Expected Outcomes Short Term: Continued assessment and intervention until BP is < 140/34mm HG in hypertensive participants. < 130/49mm HG in hypertensive participants with diabetes, heart failure or chronic kidney disease.;Long Term: Maintenance of blood pressure at goal levels.   Lipids Yes   Intervention Provide education and support for participant on nutrition & aerobic/resistive exercise along with prescribed medications to achieve LDL 70mg , HDL >40mg .   Expected Outcomes Short Term: Participant states understanding of desired  cholesterol values and is compliant with medications prescribed. Participant is following exercise prescription and nutrition guidelines.;Long Term: Cholesterol controlled with medications as prescribed, with individualized exercise RX and with personalized nutrition plan. Value goals: LDL < 70mg , HDL > 40 mg.      Core Components/Risk Factors/Patient Goals Review:    Core Components/Risk Factors/Patient Goals at Discharge (Final Review):    ITP Comments:     ITP Comments    Row Name 05/23/17 1331           ITP Comments Medical review completed today. INitial ITP created and sent to medical director for cosign and changes as necessary.  Documentation of diagnosis can be found in [facility] 04/06/2017 visit          Comments: Initial ITP

## 2017-05-23 NOTE — Progress Notes (Signed)
Daily Session Note  Patient Details  Name: Jenny Cooper MRN: [id number] Date of Birth: November 21, 1935 Referring Provider:     Cardiac Rehab from 05/23/2017 in [facility] Cardiac and Pulmonary Rehab  Referring Provider  Ida Rogue MD      Encounter Date: 05/23/2017  Check In:     Session Check In - 05/23/17 1328      Check-In   Location [facility]-Cardiac & Pulmonary Rehab   Staff Present [name] Lark, RN, BSN, CCRP;Carroll Enterkin, RN, Levie Heritage, MA, ACSM RCEP, Exercise Physiologist   Supervising physician immediately available to respond to emergencies See telemetry face sheet for immediately available ER MD   Medication changes reported     No   Fall or balance concerns reported    No   Warm-up and Cool-down Performed as group-led instruction   Resistance Training Performed Yes   VAD Patient? No     Pain Assessment   Currently in Pain? No/denies           Exercise Prescription Changes - 05/23/17 1300      Response to Exercise   Blood Pressure (Admit) 124/72   Blood Pressure (Exercise) 132/70   Blood Pressure (Exit) 120/60   Heart Rate (Admit) 62 bpm   Heart Rate (Exercise) 91 bpm   Heart Rate (Exit) 73 bpm   Oxygen Saturation (Admit) 94 %   Oxygen Saturation (Exercise) 95 %   Rating of Perceived Exertion (Exercise) 17   Perceived Dyspnea (Exercise) 2   Symptoms back pain 7-8/10   Comments walk test results     Oxygen   Oxygen Continuous   Liters 2      History  Smoking Status  . Former Smoker  . Packs/day: 2.00  . Years: 42.00  . Types: Cigarettes  . Quit date: 08/30/2000  Smokeless Tobacco  . Never Used    Comment: quit smoking in 08/28/2000    Goals Met:  Exercise tolerated well Personal goals reviewed No report of cardiac concerns or symptoms Strength training completed today  Goals Unmet:  Not Applicable  Comments: Medical evaluation completed    Dr. Emily Filbert is Medical Director for [place] and  [facility].

## 2017-06-01 ENCOUNTER — Encounter: Payer: Medicare Other | Attending: Cardiovascular Disease

## 2017-06-01 DIAGNOSIS — I480 Paroxysmal atrial fibrillation: Secondary | ICD-10-CM | POA: Insufficient documentation

## 2017-06-01 DIAGNOSIS — Z79899 Other long term (current) drug therapy: Secondary | ICD-10-CM | POA: Insufficient documentation

## 2017-06-01 DIAGNOSIS — I252 Old myocardial infarction: Secondary | ICD-10-CM | POA: Diagnosis not present

## 2017-06-01 DIAGNOSIS — E785 Hyperlipidemia, unspecified: Secondary | ICD-10-CM | POA: Diagnosis not present

## 2017-06-01 DIAGNOSIS — I483 Typical atrial flutter: Secondary | ICD-10-CM | POA: Insufficient documentation

## 2017-06-01 DIAGNOSIS — E039 Hypothyroidism, unspecified: Secondary | ICD-10-CM | POA: Insufficient documentation

## 2017-06-01 DIAGNOSIS — Z950 Presence of cardiac pacemaker: Secondary | ICD-10-CM | POA: Insufficient documentation

## 2017-06-01 DIAGNOSIS — Z951 Presence of aortocoronary bypass graft: Secondary | ICD-10-CM | POA: Insufficient documentation

## 2017-06-01 DIAGNOSIS — I2510 Atherosclerotic heart disease of native coronary artery without angina pectoris: Secondary | ICD-10-CM | POA: Insufficient documentation

## 2017-06-01 DIAGNOSIS — J449 Chronic obstructive pulmonary disease, unspecified: Secondary | ICD-10-CM | POA: Diagnosis not present

## 2017-06-01 DIAGNOSIS — I5022 Chronic systolic (congestive) heart failure: Secondary | ICD-10-CM | POA: Diagnosis present

## 2017-06-01 DIAGNOSIS — K219 Gastro-esophageal reflux disease without esophagitis: Secondary | ICD-10-CM | POA: Insufficient documentation

## 2017-06-01 DIAGNOSIS — Z87891 Personal history of nicotine dependence: Secondary | ICD-10-CM | POA: Insufficient documentation

## 2017-06-01 DIAGNOSIS — Z7901 Long term (current) use of anticoagulants: Secondary | ICD-10-CM | POA: Diagnosis not present

## 2017-06-01 DIAGNOSIS — I110 Hypertensive heart disease with heart failure: Secondary | ICD-10-CM | POA: Diagnosis not present

## 2017-06-01 NOTE — Progress Notes (Signed)
Daily Session Note  Patient Details  Name: Jenny Cooper MRN: [id number] Date of Birth: [birth year] Referring Provider:     Cardiac Rehab from 05/23/2017 in [facility] Cardiac and Pulmonary Rehab  Referring Provider  Ida Rogue MD      Encounter Date: 06/01/2017  Check In:     Session Check In - 06/01/17 1657      Check-In   Location [facility]-Cardiac & Pulmonary Rehab   Staff Present Gerlene Burdock, RN, Vickki Hearing, BA, ACSM CEP, Exercise Physiologist;Meredith Sherryll Burger, RN BSN   Supervising physician immediately available to respond to emergencies See telemetry face sheet for immediately available ER MD   Medication changes reported     No   Fall or balance concerns reported    No   Warm-up and Cool-down Performed on first and last piece of equipment   Resistance Training Performed Yes   VAD Patient? No     Pain Assessment   Currently in Pain? No/denies         History  Smoking Status  . Former Smoker  . Packs/day: 2.00  . Years: 42.00  . Types: Cigarettes  . Quit date: 08/30/2000  Smokeless Tobacco  . Never Used    Comment: quit smoking in 08/28/2000    Goals Met:  Exercise tolerated well No report of cardiac concerns or symptoms Strength training completed today  Goals Unmet:  Not Applicable  Comments: First full day of exercise!  Patient was oriented to gym and equipment including functions, settings, policies, and procedures.  Patient's individual exercise prescription and treatment plan were reviewed.  All starting workloads were established based on the results of the 6 minute walk test done at initial orientation visit.  The plan for exercise progression was also introduced and progression will be customized based on patient's performance and goals.    Dr. Emily Filbert is Medical Director for [place] and [facility].

## 2017-06-02 DIAGNOSIS — I110 Hypertensive heart disease with heart failure: Secondary | ICD-10-CM | POA: Diagnosis not present

## 2017-06-02 DIAGNOSIS — I5022 Chronic systolic (congestive) heart failure: Secondary | ICD-10-CM

## 2017-06-02 NOTE — Progress Notes (Signed)
Daily Session Note  Patient Details  Name: Jenny Cooper MRN: [id number] Date of Birth: [birth year] Referring Provider:     Cardiac Rehab from 05/23/2017 in [facility] Cardiac and Pulmonary Rehab  Referring Provider  Ida Rogue MD      Encounter Date: 06/02/2017  Check In:     Session Check In - 06/02/17 1633      Check-In   Location [facility]-Cardiac & Pulmonary Rehab   Staff Present Gerlene Burdock, RN, Moises Blood, BS, ACSM CEP, Exercise Physiologist;Joseph Flavia Shipper   Supervising physician immediately available to respond to emergencies See telemetry face sheet for immediately available ER MD   Medication changes reported     No   Fall or balance concerns reported    No   Warm-up and Cool-down Performed on first and last piece of equipment   Resistance Training Performed Yes   VAD Patient? No     Pain Assessment   Currently in Pain? No/denies         History  Smoking Status  . Former Smoker  . Packs/day: 2.00  . Years: 42.00  . Types: Cigarettes  . Quit date: 08/30/2000  Smokeless Tobacco  . Never Used    Comment: quit smoking in 08/28/2000    Goals Met:  Exercise tolerated well Strength training completed today  Goals Unmet:  Not Applicable  Comments: Pt able to follow exercise prescription today without complaint.  Will continue to monitor for progression.   Dr. Emily Filbert is Medical Director for [place] and [facility].

## 2017-06-06 DIAGNOSIS — I5022 Chronic systolic (congestive) heart failure: Secondary | ICD-10-CM

## 2017-06-06 DIAGNOSIS — I110 Hypertensive heart disease with heart failure: Secondary | ICD-10-CM | POA: Diagnosis not present

## 2017-06-06 NOTE — Progress Notes (Signed)
Daily Session Note  Patient Details  Name: Jenny Cooper MRN: [id number] Date of Birth: 03/10/1936 Referring Provider:     Cardiac Rehab from 05/23/2017 in [facility] Cardiac and Pulmonary Rehab  Referring Provider  Gollan, Timothy MD      Encounter Date: 06/06/2017  Check In:     Session Check In - 06/06/17 1712      Check-In   Location [facility]-Cardiac & Pulmonary Rehab   Staff Present Amanda Sommer, BA, ACSM CEP, Exercise Physiologist;Kelly Hayes, BS, ACSM CEP, Exercise Physiologist;Meredith Craven, RN BSN   Supervising physician immediately available to respond to emergencies See telemetry face sheet for immediately available ER MD   Medication changes reported     No   Fall or balance concerns reported    No   Warm-up and Cool-down Performed on first and last piece of equipment   Resistance Training Performed Yes   VAD Patient? No     Pain Assessment   Currently in Pain? No/denies         History  Smoking Status  . Former Smoker  . Packs/day: 2.00  . Years: 42.00  . Types: Cigarettes  . Quit date: 08/30/2000  Smokeless Tobacco  . Never Used    Comment: quit smoking in 08/28/2000    Goals Met:  Independence with exercise equipment Exercise tolerated well No report of cardiac concerns or symptoms Strength training completed today  Goals Unmet:  Not Applicable  Comments: Pt able to follow exercise prescription today without complaint.  Will continue to monitor for progression.    Dr. Mark Miller is Medical Director for [facility] and [facility]. 

## 2017-06-08 ENCOUNTER — Encounter: Payer: Self-pay | Admitting: *Deleted

## 2017-06-08 ENCOUNTER — Encounter: Payer: Medicare Other | Admitting: *Deleted

## 2017-06-08 DIAGNOSIS — I110 Hypertensive heart disease with heart failure: Secondary | ICD-10-CM | POA: Diagnosis not present

## 2017-06-08 DIAGNOSIS — I5022 Chronic systolic (congestive) heart failure: Secondary | ICD-10-CM

## 2017-06-08 NOTE — Progress Notes (Signed)
Daily Session Note  Patient Details  Name: ABRAR KOONE MRN: [id number] Date of Birth: December 04, 1935 Referring Provider:     Cardiac Rehab from 05/23/2017 in [facility] Cardiac and Pulmonary Rehab  Referring Provider  Ida Rogue MD      Encounter Date: 06/08/2017  Check In:     Session Check In - 06/08/17 1628      Check-In   Location [facility]-Cardiac & Pulmonary Rehab   Staff Present Renita Papa, RN Vickki Hearing, BA, ACSM CEP, Exercise Physiologist;Carroll Enterkin, RN, BSN   Supervising physician immediately available to respond to emergencies See telemetry face sheet for immediately available ER MD   Medication changes reported     No   Fall or balance concerns reported    No   Warm-up and Cool-down Performed on first and last piece of equipment   Resistance Training Performed Yes   VAD Patient? No     Pain Assessment   Currently in Pain? No/denies         History  Smoking Status  . Former Smoker  . Packs/day: 2.00  . Years: 42.00  . Types: Cigarettes  . Quit date: 08/30/2000  Smokeless Tobacco  . Never Used    Comment: quit smoking in 08/28/2000    Goals Met:  Proper associated with RPD/PD & O2 Sat Independence with exercise equipment Using PLB without cueing & demonstrates good technique Exercise tolerated well No report of cardiac concerns or symptoms Strength training completed today  Goals Unmet:  Not Applicable  Comments: Pt able to follow exercise prescription today without complaint.  Will continue to monitor for progression.    Dr. Emily Filbert is Medical Director for [place] and [facility].

## 2017-06-08 NOTE — Progress Notes (Signed)
Cardiac Individual Treatment Plan  Patient Details  Name: Jenny Cooper MRN: [id number] Date of Birth: 1936/04/30 Referring Provider:     Cardiac Rehab from 05/23/2017 in [facility] Cardiac and Pulmonary Rehab  Referring Provider  Ida Rogue MD      Initial Encounter Date:    Cardiac Rehab from 05/23/2017 in [facility] Cardiac and Pulmonary Rehab  Date  05/23/17  Referring Provider  Ida Rogue MD      Visit Diagnosis: No diagnosis found.  Patient's Home Medications on Admission:  Current Outpatient Prescriptions:  .  acetaminophen (TYLENOL) 500 MG tablet, Take 1,000 mg by mouth every 6 (six) hours. , Disp: , Rfl:  .  amiodarone (PACERONE) 200 MG tablet, Take 200 mg by mouth daily. , Disp: , Rfl:  .  apixaban (ELIQUIS) 5 MG TABS tablet, Take 5 mg by mouth 2 (two) times daily. , Disp: , Rfl:  .  atorvastatin (LIPITOR) 20 MG tablet, TAKE 1 TABLET (20 MG TOTAL) BY MOUTH AT BEDTIME., Disp: 30 tablet, Rfl: 5 .  Calcium Carb-Cholecalciferol (CALCIUM 600-D PO), Take 1 tablet by mouth every morning. , Disp: , Rfl:  .  Cetirizine HCl 10 MG CAPS, Take 1 capsule (10 mg total) by mouth 1 day or 1 dose. (Patient taking differently: Take 1 capsule by mouth daily. ), Disp: 30 capsule, Rfl: 5 .  furosemide (LASIX) 40 MG tablet, Take 1 tablet (40 mg total) by mouth 2 (two) times daily. (Patient taking differently: Take 40 mg by mouth 2 (two) times daily. Except on Sunday.), Disp: 60 tablet, Rfl: 6 .  guaiFENesin-codeine 100-10 MG/5ML syrup, Take 5 mLs by mouth every 4 (four) hours as needed for cough., Disp: 240 mL, Rfl: 0 .  ipratropium-albuterol (DUONEB) 0.5-2.5 (3) MG/3ML SOLN, Take 3 mLs by nebulization every 6 (six) hours as needed., Disp: , Rfl:  .  levothyroxine (SYNTHROID, LEVOTHROID) 75 MCG tablet, TAKE 1 TABLET (75 MCG TOTAL) BY MOUTH DAILY BEFORE BREAKFAST., Disp: 30 tablet, Rfl: 5 .  metoprolol succinate (TOPROL-XL) 25 MG 24 hr tablet, TAKE 1 TABLET BY MOUTH EVERY MORNING AND 2 TABS IN THE  EVENING, Disp: 90 tablet, Rfl: 3 .  predniSONE (STERAPRED UNI-PAK 21 TAB) 10 MG (21) TBPK tablet, Take as directed with food. (Patient not taking: Reported on 05/23/2017), Disp: 21 tablet, Rfl: 0 .  sacubitril-valsartan (ENTRESTO) 24-26 MG, Take 1 tablet by mouth 2 (two) times daily., Disp: 60 tablet, Rfl: 6 .  senna (SENOKOT) 8.6 MG TABS tablet, Take 1 tablet (8.6 mg total) by mouth daily., Disp: 30 each, Rfl: 0 .  umeclidinium bromide (INCRUSE ELLIPTA) 62.5 MCG/INH AEPB, Inhale 1 puff into the lungs daily., Disp: 1 each, Rfl: 0 No current facility-administered medications for this visit.   Facility-Administered Medications Ordered in Other Visits:  .  sodium chloride 0.9 % injection 10 mL, 10 mL, Intravenous, PRN, Choksi, Janak, MD, 10 mL at 02/19/15 1000 .  sodium chloride flush (NS) 0.9 % injection 10 mL, 10 mL, Intravenous, PRN, Cammie Sickle, MD, 10 mL at 02/16/16 1048  Past Medical History: Past Medical History:  Diagnosis Date  . Atypical atrial flutter ([place])    a. s/p ablation 07/27/2013 followed by Dr. Rockey Situ  . CAD (coronary artery disease)    a. s/p MI x 2 in 2002 s/p PCI x 2 in 2002; b. s/p 2v CABG 2002; c. stress echo 07/2004 w/ evi of pos & inf infarct & no evi of ischemia; d. 4/08 dipyridamole scan w/ multiple areas of  infarct, no ischemia, EF 49%; e. cath 04/28/15 3v CAD, med Rx rec, no targets for revasc, LM lum irregs, pLAD 30%, 100%, ost-pLCx 60%, [name] 99%, OM2 100%, p-mRCA 90%, [name] 100% L-R collats, VG-mLAD irregs, VG-OM2 oc  . Carcinoma of right lung ([place]) 01/03/2015   a. followed by Dr. Oliva Bustard  . Chronic systolic CHF (congestive heart failure) ([place])    a. echo 03/2015: EF 30-35%, sev ant/inf/pos HK, in mild to mod MR  . Complication of anesthesia    more recently patient oxygen levels do not rebound as quickly  . Compressed spine fracture ([place]) 08/06/2016   lumbar 2, t11, t12  . COPD (chronic obstructive pulmonary disease) ([place])   . Fractured pelvis ([place])  05/26/2016   2 places  . GERD (gastroesophageal reflux disease)   . History of blood clots    12/2001  . History of colonoscopy 2013  . History of mammography, screening 2015  . History of Papanicolaou smear of cervix 2013  . HLD (hyperlipidemia)   . HTN (hypertension)   . Hypothyroidism   . Lung cancer ([place])   . Mitral regurgitation    a. s/p mitral ring placement 09/2000; b. echo 09/2010: EF 50%, inf HK, post AK, mild MR, prosthetic mitral valve ring w/ peak gradient of 10 mmHg; b. echo 2/13: EF 50%, mild MR/TR     . Myocardial infarction ([place])    X 2 (LAST ONE IN 2002)  . Neuropathy   . Pacemaker    a. MDT 2002; b. generator replacement 2013; c. followed by Dr. Omelia Blackwater, MD  . PAF (paroxysmal atrial fibrillation) ([place])    a. on Eliquis   . Personal history of chemotherapy   . Personal history of radiation therapy     Tobacco Use: History  Smoking Status  . Former Smoker  . Packs/day: 2.00  . Years: 42.00  . Types: Cigarettes  . Quit date: 08/30/2000  Smokeless Tobacco  . Never Used    Comment: quit smoking in 08/28/2000    Labs: Recent Review Flowsheet Data    Labs for ITP Cardiac and Pulmonary Rehab Latest Ref Rng & Units 01/05/2016 02/16/2016 06/29/2016 12/27/2016 04/06/2017   Cholestrol 100 - 199 mg/dL - 128 - - -   LDLCALC 0 - 99 mg/dL - 45 - - -   HDL >39 mg/dL - 56 - - -   Trlycerides 0 - 149 mg/dL - 133 - - -   Hemoglobin A1c 4.8 - 5.6 % 7.5(H) - 6.3(H) 5.3 -   HCO3 20.0 - 28.0 mmol/L - - - - 36.1(H)   ACIDBASEDEF 0.0 - 2.0 mmol/L - - - - -       Exercise Target Goals:    Exercise Program Goal: Individual exercise prescription set with THRR, safety & activity barriers. Participant demonstrates ability to understand and report RPE using BORG scale, to self-measure pulse accurately, and to acknowledge the importance of the exercise prescription.  Exercise Prescription Goal: Starting with aerobic activity 30 plus minutes a day, 3 days per week for initial  exercise prescription. Provide home exercise prescription and guidelines that participant acknowledges understanding prior to discharge.  Activity Barriers & Risk Stratification:     Activity Barriers & Cardiac Risk Stratification - 05/23/17 1337      Activity Barriers & Cardiac Risk Stratification   Activity Barriers Back Problems;Joint Problems;Shortness of Breath;Decreased Ventricular Function;Muscular Weakness;Neck/Spine Problems;Deconditioning   Cardiac Risk Stratification High      6 Minute Walk:  6 Minute Walk    Row Name 05/23/17 1622         6 Minute Walk   Phase Initial     Distance 667 feet     Walk Time 4.9 minutes     # of Rest Breaks 2  21 sec, 45 sec     MPH 1.54     METS 1.19     RPE 17     Perceived Dyspnea  2     VO2 Peak 4.18     Symptoms Yes (comment)     Comments back pain 7-8/10 (chronic), pulling e-cylinder as she left her tank in the car     Resting HR 62 bpm     Resting BP 124/74     Resting Oxygen Saturation  94 %     Exercise Oxygen Saturation  during 6 min walk 91 %     Max Ex. HR 91 bpm     Max Ex. BP 132/70     2 Minute Post BP 120/60       Interval HR   1 Minute HR 93     2 Minute HR 91     3 Minute HR 89     4 Minute HR 88     5 Minute HR 97     6 Minute HR 78     2 Minute Post HR 87     Interval Heart Rate? Yes       Interval Oxygen   Interval Oxygen? Yes     Baseline Oxygen Saturation % 94 %     1 Minute Oxygen Saturation % 91 %     1 Minute Liters of Oxygen 2 L     2 Minute Oxygen Saturation % 91 %     2 Minute Liters of Oxygen 2 L     3 Minute Oxygen Saturation % 95 %     3 Minute Liters of Oxygen 2 L     4 Minute Oxygen Saturation % 95 %     4 Minute Liters of Oxygen 2 L     5 Minute Oxygen Saturation % 95 %     5 Minute Liters of Oxygen 2 L     6 Minute Oxygen Saturation % 95 %     6 Minute Liters of Oxygen 2 L     2 Minute Post Oxygen Saturation % 91 %     2 Minute Post Liters of Oxygen 2 L        Oxygen  Initial Assessment:     Oxygen Initial Assessment - 05/23/17 1337      Intervention   Short Term Goals To learn and demonstrate proper use of respiratory medications      Oxygen Re-Evaluation:   Oxygen Discharge (Final Oxygen Re-Evaluation):   Initial Exercise Prescription:     Initial Exercise Prescription - 05/23/17 1600      Date of Initial Exercise RX and Referring Provider   Date 05/23/17   Referring Provider Ida Rogue MD     Oxygen   Oxygen Continuous   Liters 2     Treadmill   MPH 1.5   Grade 0   Minutes 15   METs 2.15     NuStep   Level 1   SPM 80   Minutes 15   METs 1.5     Arm Ergometer   Level 1   Watts 12   RPM 50   Minutes 15  METs 1.5     Prescription Details   Frequency (times per week) 3   Duration Progress to 45 minutes of aerobic exercise without signs/symptoms of physical distress     Intensity   THRR 40-80% of Max Heartrate 94-125   Ratings of Perceived Exertion 11-13   Perceived Dyspnea 0-4     Progression   Progression Continue to progress workloads to maintain intensity without signs/symptoms of physical distress.     Resistance Training   Training Prescription Yes   Weight 3 lbs   Reps 10-15      Perform Capillary Blood Glucose checks as needed.  Exercise Prescription Changes:     Exercise Prescription Changes    Row Name 05/23/17 1300             Response to Exercise   Blood Pressure (Admit) 124/72       Blood Pressure (Exercise) 132/70       Blood Pressure (Exit) 120/60       Heart Rate (Admit) 62 bpm       Heart Rate (Exercise) 91 bpm       Heart Rate (Exit) 73 bpm       Oxygen Saturation (Admit) 94 %       Oxygen Saturation (Exercise) 95 %       Rating of Perceived Exertion (Exercise) 17       Perceived Dyspnea (Exercise) 2       Symptoms back pain 7-8/10       Comments walk test results         Oxygen   Oxygen Continuous       Liters 2          Exercise Comments:     Exercise  Comments    Row Name 06/01/17 1732           Exercise Comments First full day of exercise!  Patient was oriented to gym and equipment including functions, settings, policies, and procedures.  Patient's individual exercise prescription and treatment plan were reviewed.  All starting workloads were established based on the results of the 6 minute walk test done at initial orientation visit.  The plan for exercise progression was also introduced and progression will be customized based on patient's performance and goals.          Exercise Goals and Review:     Exercise Goals    Row Name 05/23/17 1628             Exercise Goals   Increase Physical Activity Yes       Intervention Provide advice, education, support and counseling about physical activity/exercise needs.;Develop an individualized exercise prescription for aerobic and resistive training based on initial evaluation findings, risk stratification, comorbidities and participant's personal goals.       Expected Outcomes Achievement of increased cardiorespiratory fitness and enhanced flexibility, muscular endurance and strength shown through measurements of functional capacity and personal statement of participant.       Increase Strength and Stamina Yes       Intervention Provide advice, education, support and counseling about physical activity/exercise needs.;Develop an individualized exercise prescription for aerobic and resistive training based on initial evaluation findings, risk stratification, comorbidities and participant's personal goals.       Expected Outcomes Achievement of increased cardiorespiratory fitness and enhanced flexibility, muscular endurance and strength shown through measurements of functional capacity and personal statement of participant.       Able to understand and use rate of perceived  exertion (RPE) scale Yes       Intervention Provide education and explanation on how to use RPE scale       Expected  Outcomes Short Term: Able to use RPE daily in rehab to express subjective intensity level;Long Term:  Able to use RPE to guide intensity level when exercising independently       Able to understand and use Dyspnea scale Yes       Intervention Provide education and explanation on how to use Dyspnea scale       Expected Outcomes Short Term: Able to use Dyspnea scale daily in rehab to express subjective sense of shortness of breath during exertion;Long Term: Able to use Dyspnea scale to guide intensity level when exercising independently       Knowledge and understanding of Target Heart Rate Range (THRR) Yes       Intervention Provide education and explanation of THRR including how the numbers were predicted and where they are located for reference       Expected Outcomes Short Term: Able to state/look up THRR;Long Term: Able to use THRR to govern intensity when exercising independently;Short Term: Able to use daily as guideline for intensity in rehab       Able to check pulse independently Yes       Intervention Provide education and demonstration on how to check pulse in carotid and radial arteries.;Review the importance of being able to check your own pulse for safety during independent exercise       Expected Outcomes Short Term: Able to explain why pulse checking is important during independent exercise;Long Term: Able to check pulse independently and accurately       Understanding of Exercise Prescription Yes       Intervention Provide education, explanation, and written materials on patient's individual exercise prescription       Expected Outcomes Short Term: Able to explain program exercise prescription;Long Term: Able to explain home exercise prescription to exercise independently          Exercise Goals Re-Evaluation :     Exercise Goals Re-Evaluation    Row Name 06/01/17 1732             Exercise Goal Re-Evaluation   Exercise Goals Review Increase Physical Activity;Increase  Strength and Stamina;Able to understand and use rate of perceived exertion (RPE) scale       Comments Reviewed RPE scale, THR and program prescription with pt today.  Pt voiced understanding and was given a copy of goals to take home.        Expected Outcomes Short: Use RPE daily to regulate intensity.  Long: Follow program prescription in THR.          Discharge Exercise Prescription (Final Exercise Prescription Changes):     Exercise Prescription Changes - 05/23/17 1300      Response to Exercise   Blood Pressure (Admit) 124/72   Blood Pressure (Exercise) 132/70   Blood Pressure (Exit) 120/60   Heart Rate (Admit) 62 bpm   Heart Rate (Exercise) 91 bpm   Heart Rate (Exit) 73 bpm   Oxygen Saturation (Admit) 94 %   Oxygen Saturation (Exercise) 95 %   Rating of Perceived Exertion (Exercise) 17   Perceived Dyspnea (Exercise) 2   Symptoms back pain 7-8/10   Comments walk test results     Oxygen   Oxygen Continuous   Liters 2      Nutrition:  Target Goals: Understanding of nutrition guidelines, daily intake of  sodium <1542m, cholesterol <2012m calories 30% from fat and 7% or less from saturated fats, daily to have 5 or more servings of fruits and vegetables.  Biometrics:     Pre Biometrics - 05/23/17 1628      Pre Biometrics   Height _0  (1.651 m)   Weight 164 lb 4.8 oz (74.5 kg)   Waist Circumference 39 inches   Hip Circumference 38 inches   Waist to Hip Ratio 1.03 %   BMI (Calculated) 27.34   Single Leg Stand 1.02 seconds       Nutrition Therapy Plan and Nutrition Goals:     Nutrition Therapy & Goals - 05/23/17 1340      Intervention Plan   Intervention Prescribe, educate and counsel regarding individualized specific dietary modifications aiming towards targeted core components such as weight, hypertension, lipid management, diabetes, heart failure and other comorbidities.   Expected Outcomes Short Term Goal: Understand basic principles of dietary content,  such as calories, fat, sodium, cholesterol and nutrients.;Short Term Goal: A plan has been developed with personal nutrition goals set during dietitian appointment.;Long Term Goal: Adherence to prescribed nutrition plan.      Nutrition Discharge: Rate Your Plate Scores:     Nutrition Assessments - 05/23/17 1340      MEDFICTS Scores   Pre Score 19      Nutrition Goals Re-Evaluation:   Nutrition Goals Discharge (Final Nutrition Goals Re-Evaluation):   Psychosocial: Target Goals: Acknowledge presence or absence of significant depression and/or stress, maximize coping skills, provide positive support system. Participant is able to verbalize types and ability to use techniques and skills needed for reducing stress and depression.   Initial Review & Psychosocial Screening:     Initial Psych Review & Screening - 05/23/17 1340      Initial Review   Current issues with Current Depression;Current Sleep Concerns;Current Stress Concerns  Sleeping 12-14 hours a day. cannot do much of anything she likes to do because of back pain that occurs after standing for periods of time.    Source of Stress Concerns Chronic Illness;Financial   Comments medications are coasting 300 dollars more now that she has been switched to 2 new meds.  Unable to get coupon pricing since has Medicare.  Physician is helping every now and then with samples. recent back surgery in Dec 2017     FaPierceYes  family     Barriers   Psychosocial barriers to participate in program There are no identifiable barriers or psychosocial needs.;The patient should benefit from training in stress management and relaxation.     Screening Interventions   Interventions Encouraged to exercise;Program counselor consult;Provide feedback about the scores to participant;To provide support and resources with identified psychosocial needs;Yes   Expected Outcomes Short Term goal: Utilizing psychosocial  counselor, staff and physician to assist with identification of specific Stressors or current issues interfering with healing process. Setting desired goal for each stressor or current issue identified.;Long Term Goal: Stressors or current issues are controlled or eliminated.;Short Term goal: Identification and review with participant of any Quality of Life or Depression concerns found by scoring the questionnaire.;Long Term goal: The participant improves quality of Life and PHQ9 Scores as seen by post scores and/or verbalization of changes      Quality of Life Scores:      Quality of Life - 05/23/17 1345      Quality of Life Scores   Health/Function Pre 14.79 %   Socioeconomic  Pre 12.67 %   Psych/Spiritual Pre 10.93 %   Family Pre 30 %   GLOBAL Pre 15.52 %      PHQ-9: Recent Review Flowsheet Data    Depression screen [facility] 2/9 05/23/2017 05/18/2017 04/14/2017 03/07/2017 08/02/2016   Decreased Interest 1 0 0 0 0   Down, Depressed, Hopeless 1 3 0 0 0   PHQ - 2 Score 2 3 0 0 0   Altered sleeping 1 - - - -   Tired, decreased energy 1 - - - -   Change in appetite 1 - - - -   Feeling bad or failure about yourself  0 - - - -   Trouble concentrating 0 - - - -   Moving slowly or fidgety/restless 0 - - - -   Suicidal thoughts 0 - - - -   PHQ-9 Score 5 - - - -   Difficult doing work/chores Somewhat difficult - - - -     Interpretation of Total Score  Total Score Depression Severity:  1-4 = Minimal depression, 5-9 = Mild depression, 10-14 = Moderate depression, 15-19 = Moderately severe depression, 20-27 = Severe depression   Psychosocial Evaluation and Intervention:     Psychosocial Evaluation - 06/06/17 1700      Psychosocial Evaluation & Interventions   Interventions Encouraged to exercise with the program and follow exercise prescription;Relaxation education;Stress management education   Comments Counselor met with Ms. Terri Skains) today for initial psychosocial evaluation.  She is  a 81 year old who has multiple health issues including conditions of her heart and lungs.  She is on Oxygen.  Jalisa has a strong support system with a son and (2) daughters who live locally.  She reports sleeping "too much" with typically 12 hours or more/night.  She has a good appetite.  Elysia denies a history of depression or anxiety; but admits to some current symptoms with all of her health issues and some financial concerns at this time.  She states her mood is generally stable but with a back surgery earlier this year; lung cancer two years ago and now her current condition, it is difficult to be positive lately.  Shavanna's goals for this program are to get some strength back and to walk better (which she has not been able to do since her back surgery 10 months ago).  Staff will follow with Pamala Hurry throughout the course of this program.    Expected Outcomes Neyah will benefit from consistent exercise to achieve her stated goals.  The educational and psychoeducational components will be helpful in learning more about her condition and managing/coping more positively.  Staff will follow.    Continue Psychosocial Services  Follow up required by staff      Psychosocial Re-Evaluation:   Psychosocial Discharge (Final Psychosocial Re-Evaluation):   Vocational Rehabilitation: Provide vocational rehab assistance to qualifying candidates.   Vocational Rehab Evaluation & Intervention:     Vocational Rehab - 05/23/17 1348      Initial Vocational Rehab Evaluation & Intervention   Assessment shows need for Vocational Rehabilitation No      Education: Education Goals: Education classes will be provided on a variety of topics geared toward better understanding of heart health and risk factor modification. Participant will state understanding/return demonstration of topics presented as noted by education test scores.  Learning Barriers/Preferences:     Learning Barriers/Preferences -  05/23/17 1346      Learning Barriers/Preferences   Learning Barriers Exercise Concerns  concerned  about back pain   Learning Preferences None      Education Topics: General Nutrition Guidelines/Fats and Fiber: -Group instruction provided by verbal, written material, models and posters to present the general guidelines for heart healthy nutrition. Gives an explanation and review of dietary fats and fiber.   Controlling Sodium/Reading Food Labels: -Group verbal and written material supporting the discussion of sodium use in heart healthy nutrition. Review and explanation with models, verbal and written materials for utilization of the food label.   Exercise Physiology & Risk Factors: - Group verbal and written instruction with models to review the exercise physiology of the cardiovascular system and associated critical values. Details cardiovascular disease risk factors and the goals associated with each risk factor.   Aerobic Exercise & Resistance Training: - Gives group verbal and written discussion on the health impact of inactivity. On the components of aerobic and resistive training programs and the benefits of this training and how to safely progress through these programs.   Flexibility, Balance, General Exercise Guidelines: - Provides group verbal and written instruction on the benefits of flexibility and balance training programs. Provides general exercise guidelines with specific guidelines to those with heart or lung disease. Demonstration and skill practice provided.   Stress Management: - Provides group verbal and written instruction about the health risks of elevated stress, cause of high stress, and healthy ways to reduce stress.   Depression: - Provides group verbal and written instruction on the correlation between heart/lung disease and depressed mood, treatment options, and the stigmas associated with seeking treatment.   Anatomy & Physiology of the Heart: -  Group verbal and written instruction and models provide basic cardiac anatomy and physiology, with the coronary electrical and arterial systems. Review of: AMI, Angina, Valve disease, Heart Failure, Cardiac Arrhythmia, Pacemakers, and the ICD.   Cardiac Procedures: - Group verbal and written instruction to review commonly prescribed medications for heart disease. Reviews the medication, class of the drug, and side effects. Includes the steps to properly store meds and maintain the prescription regimen. (beta blockers and nitrates)   Cardiac Medications I: - Group verbal and written instruction to review commonly prescribed medications for heart disease. Reviews the medication, class of the drug, and side effects. Includes the steps to properly store meds and maintain the prescription regimen.   Cardiac Medications II: -Group verbal and written instruction to review commonly prescribed medications for heart disease. Reviews the medication, class of the drug, and side effects. (all other drug classes)    Go Sex-Intimacy & Heart Disease, Get SMART - Goal Setting: - Group verbal and written instruction through game format to discuss heart disease and the return to sexual intimacy. Provides group verbal and written material to discuss and apply goal setting through the application of the S.M.A.R.T. Method.   Other Matters of the Heart: - Provides group verbal, written materials and models to describe Heart Failure, Angina, Valve Disease, Peripheral Artery Disease, and Diabetes in the realm of heart disease. Includes description of the disease process and treatment options available to the cardiac patient.   Exercise & Equipment Safety: - Individual verbal instruction and demonstration of equipment use and safety with use of the equipment.   Cardiac Rehab from 06/06/2017 in [facility] Cardiac and Pulmonary Rehab  Date  05/23/17  Educator  SB  Instruction Review Code  1- Verbalizes Understanding       Infection Prevention: - Provides verbal and written material to individual with discussion of infection control including proper hand  washing and proper equipment cleaning during exercise session.   Cardiac Rehab from 06/06/2017 in [facility] Cardiac and Pulmonary Rehab  Date  05/23/17  Educator  SB  Instruction Review Code  1- Verbalizes Understanding      Falls Prevention: - Provides verbal and written material to individual with discussion of falls prevention and safety.   Cardiac Rehab from 06/06/2017 in [facility] Cardiac and Pulmonary Rehab  Date  05/23/17  Educator  SB  Instruction Review Code  1- Verbalizes Understanding      Diabetes: - Individual verbal and written instruction to review signs/symptoms of diabetes, desired ranges of glucose level fasting, after meals and with exercise. Acknowledge that pre and post exercise glucose checks will be done for 3 sessions at entry of program.   Other: -Provides group and verbal instruction on various topics (see comments)    Knowledge Questionnaire Score:     Knowledge Questionnaire Score - 05/23/17 1347      Knowledge Questionnaire Score   Pre Score 24/28  reviewed correct responses with Pamala Hurry. she had no further questions and verbalized understanding of the correct responses      Core Components/Risk Factors/Patient Goals at Admission:     Personal Goals and Risk Factors at Admission - 05/23/17 1348      Core Components/Risk Factors/Patient Goals on Admission    Weight Management Yes;Weight Loss   Intervention Weight Management: Develop a combined nutrition and exercise program designed to reach desired caloric intake, while maintaining appropriate intake of nutrient and fiber, sodium and fats, and appropriate energy expenditure required for the weight goal.;Weight Management: Provide education and appropriate resources to help participant work on and attain dietary goals.;Weight Management/Obesity: Establish reasonable  short term and long term weight goals.   Admit Weight 164 lb 4.8 oz (74.5 kg)   Goal Weight: Short Term 132 lb (59.9 kg)   Goal Weight: Long Term 148 lb (67.1 kg)   Expected Outcomes Short Term: Continue to assess and modify interventions until short term weight is achieved;Long Term: Adherence to nutrition and physical activity/exercise program aimed toward attainment of established weight goal;Weight Loss: Understanding of general recommendations for a balanced deficit meal plan, which promotes 1-2 lb weight loss per week and includes a negative energy balance of [phone] kcal/d;Understanding recommendations for meals to include 15-35% energy as protein, 25-35% energy from fat, 35-60% energy from carbohydrates, less than 279m of dietary cholesterol, 20-35 gm of total fiber daily;Understanding of distribution of calorie intake throughout the day with the consumption of 4-5 meals/snacks   Heart Failure Yes   Intervention Provide a combined exercise and nutrition program that is supplemented with education, support and counseling about heart failure. Directed toward relieving symptoms such as shortness of breath, decreased exercise tolerance, and extremity edema.   Expected Outcomes Improve functional capacity of life;Short term: Attendance in program 2-3 days a week with increased exercise capacity. Reported lower sodium intake. Reported increased fruit and vegetable intake. Reports medication compliance.;Short term: Daily weights obtained and reported for increase. Utilizing diuretic protocols set by physician.;Long term: Adoption of self-care skills and reduction of barriers for early signs and symptoms recognition and intervention leading to self-care maintenance.   Hypertension Yes   Intervention Provide education on lifestyle modifcations including regular physical activity/exercise, weight management, moderate sodium restriction and increased consumption of fresh fruit, vegetables, and low fat dairy,  alcohol moderation, and smoking cessation.;Monitor prescription use compliance.   Expected Outcomes Short Term: Continued assessment and intervention until BP is < 140/945mHG  in hypertensive participants. < 130/65m HG in hypertensive participants with diabetes, heart failure or chronic kidney disease.;Long Term: Maintenance of blood pressure at goal levels.   Lipids Yes   Intervention Provide education and support for participant on nutrition & aerobic/resistive exercise along with prescribed medications to achieve LDL <740m HDL >4075m  Expected Outcomes Short Term: Participant states understanding of desired cholesterol values and is compliant with medications prescribed. Participant is following exercise prescription and nutrition guidelines.;Long Term: Cholesterol controlled with medications as prescribed, with individualized exercise RX and with personalized nutrition plan. Value goals: LDL < 69m56mDL > 40 mg.      Core Components/Risk Factors/Patient Goals Review:    Core Components/Risk Factors/Patient Goals at Discharge (Final Review):    ITP Comments:     ITP Comments    Row Name 05/23/17 1331 06/08/17 0835         ITP Comments Medical review completed today. INitial ITP created and sent to medical director for cosign and changes as necessary.  Documentation of diagnosis can be found in [facility]/2018 visit 30 day review. Continue with ITP unless directed changes per Medical Director Review.          Comments:

## 2017-06-13 ENCOUNTER — Encounter: Payer: Medicare Other | Admitting: *Deleted

## 2017-06-13 DIAGNOSIS — I5022 Chronic systolic (congestive) heart failure: Secondary | ICD-10-CM

## 2017-06-13 DIAGNOSIS — I110 Hypertensive heart disease with heart failure: Secondary | ICD-10-CM | POA: Diagnosis not present

## 2017-06-13 NOTE — Progress Notes (Signed)
Daily Session Note  Patient Details  Name: Jenny Cooper MRN: [id number] Date of Birth: 04/22/1936 Referring Provider:     Cardiac Rehab from 05/23/2017 in [facility] Cardiac and Pulmonary Rehab  Referring Provider  Gollan, Timothy MD      Encounter Date: 06/13/2017  Check In:     Session Check In - 06/13/17 1633      Check-In   Location [facility]-Cardiac & Pulmonary Rehab   Supervising physician immediately available to respond to emergencies See telemetry face sheet for immediately available ER MD   Medication changes reported     No   Fall or balance concerns reported    No   Warm-up and Cool-down Performed on first and last piece of equipment   Resistance Training Performed Yes   VAD Patient? No     Pain Assessment   Currently in Pain? No/denies   Multiple Pain Sites No         History  Smoking Status  . Former Smoker  . Packs/day: 2.00  . Years: 42.00  . Types: Cigarettes  . Quit date: 08/30/2000  Smokeless Tobacco  . Never Used    Comment: quit smoking in 08/28/2000    Goals Met:  Independence with exercise equipment Exercise tolerated well No report of cardiac concerns or symptoms Strength training completed today  Goals Unmet:  Not Applicable  Comments: Pt able to follow exercise prescription today without complaint.  Will continue to monitor for progression.    Dr. Mark Miller is Medical Director for [facility] and [facility]. 

## 2017-06-15 VITALS — BP 104/60 | HR 92 | Wt 162.0 lb

## 2017-06-15 DIAGNOSIS — I110 Hypertensive heart disease with heart failure: Secondary | ICD-10-CM | POA: Diagnosis not present

## 2017-06-15 DIAGNOSIS — I5022 Chronic systolic (congestive) heart failure: Secondary | ICD-10-CM

## 2017-06-15 NOTE — Progress Notes (Signed)
Daily Session Note  Patient Details  Name: Jenny Cooper MRN: [id number] Date of Birth: [birth year] Referring Provider:     Cardiac Rehab from 05/23/2017 in [facility] Cardiac and Pulmonary Rehab  Referring Provider  Ida Rogue MD      Encounter Date: 06/15/2017  Check In:     Session Check In - 06/15/17 1621      Check-In   Location [facility]-Cardiac & Pulmonary Rehab   Staff Present Gerlene Burdock, RN, Vickki Hearing, BA, ACSM CEP, Exercise Physiologist;Other  [place] physician immediately available to respond to emergencies See telemetry face sheet for immediately available ER MD   Medication changes reported     No   Fall or balance concerns reported    No   Warm-up and Cool-down Performed on first and last piece of equipment   Resistance Training Performed Yes   VAD Patient? No     Pain Assessment   Currently in Pain? No/denies         History  Smoking Status  . Former Smoker  . Packs/day: 2.00  . Years: 42.00  . Types: Cigarettes  . Quit date: 08/30/2000  Smokeless Tobacco  . Never Used    Comment: quit smoking in 08/28/2000    Goals Met:  Independence with exercise equipment No report of cardiac concerns or symptoms Strength training completed today  Goals Unmet:  Not Applicable  Comments: Deslyn had some back pain today so she will try not doing TM and see if that helps.   Dr. Emily Filbert is Medical Director for [place] and [facility].

## 2017-06-16 DIAGNOSIS — I5022 Chronic systolic (congestive) heart failure: Secondary | ICD-10-CM

## 2017-06-16 DIAGNOSIS — I110 Hypertensive heart disease with heart failure: Secondary | ICD-10-CM | POA: Diagnosis not present

## 2017-06-16 NOTE — Progress Notes (Signed)
Daily Session Note  Patient Details  Name: Jenny Cooper MRN: [id number] Date of Birth: [birth year] Referring Provider:     Cardiac Rehab from 05/23/2017 in [facility] Cardiac and Pulmonary Rehab  Referring Provider  Ida Rogue MD      Encounter Date: 06/16/2017  Check In:     Session Check In - 06/16/17 1639      Check-In   Location [facility]-Cardiac & Pulmonary Rehab   Staff Present Gerlene Burdock, RN, Moises Blood, BS, ACSM CEP, Exercise Physiologist;Sareen Randon Flavia Shipper   Supervising physician immediately available to respond to emergencies See telemetry face sheet for immediately available ER MD   Medication changes reported     No   Fall or balance concerns reported    No   Warm-up and Cool-down Performed on first and last piece of equipment   Resistance Training Performed Yes   VAD Patient? No     Pain Assessment   Currently in Pain? No/denies   Multiple Pain Sites No           Exercise Prescription Changes - 06/16/17 1400      Response to Exercise   Blood Pressure (Admit) 112/62   Blood Pressure (Exercise) 126/68   Blood Pressure (Exit) 98/60   Heart Rate (Admit) 77 bpm   Heart Rate (Exercise) 92 bpm   Heart Rate (Exit) 63 bpm   Rating of Perceived Exertion (Exercise) 13   Comments back hurt on TM - 02 tank low   Duration Continue with 45 min of aerobic exercise without signs/symptoms of physical distress.   Intensity THRR unchanged     Progression   Progression Continue to progress workloads to maintain intensity without signs/symptoms of physical distress.   Average METs 2.35     Resistance Training   Training Prescription Yes   Weight 3 lb   Reps 10-15     Interval Training   Interval Training No     Oxygen   Oxygen Continuous   Liters 2     Treadmill   MPH 1.4   Grade 0   Minutes 15   METs 2.07     Arm Ergometer   Level 1   RPM 50   Minutes 15   METs 1.8      History  Smoking Status  . Former Smoker  . Packs/day: 2.00   . Years: 42.00  . Types: Cigarettes  . Quit date: 08/30/2000  Smokeless Tobacco  . Never Used    Comment: quit smoking in 08/28/2000    Goals Met:  Independence with exercise equipment Exercise tolerated well No report of cardiac concerns or symptoms Strength training completed today  Goals Unmet:  Not Applicable  Comments: Pt able to follow exercise prescription today without complaint.  Will continue to monitor for progression.   Dr. Emily Filbert is Medical Director for [place] and [facility].

## 2017-06-20 DIAGNOSIS — I5022 Chronic systolic (congestive) heart failure: Secondary | ICD-10-CM

## 2017-06-20 DIAGNOSIS — I110 Hypertensive heart disease with heart failure: Secondary | ICD-10-CM | POA: Diagnosis not present

## 2017-06-20 NOTE — Progress Notes (Signed)
Daily Session Note  Patient Details  Name: Jenny Cooper MRN: [id number] Date of Birth: September 16, 1935 Referring Provider:     Cardiac Rehab from 05/23/2017 in [facility] Cardiac and Pulmonary Rehab  Referring Provider  Ida Rogue MD      Encounter Date: 06/20/2017  Check In:     Session Check In - 06/20/17 1632      Check-In   Location [facility]-Cardiac & Pulmonary Rehab   Staff Present Nada Maclachlan, BA, ACSM CEP, Exercise Physiologist;Kelly Amedeo Plenty, BS, ACSM CEP, Exercise Physiologist;Meredith Sherryll Burger, RN BSN   Supervising physician immediately available to respond to emergencies See telemetry face sheet for immediately available ER MD   Medication changes reported     No   Fall or balance concerns reported    No   Warm-up and Cool-down Performed on first and last piece of equipment   Resistance Training Performed Yes   VAD Patient? No     Pain Assessment   Currently in Pain? No/denies         History  Smoking Status  . Former Smoker  . Packs/day: 2.00  . Years: 42.00  . Types: Cigarettes  . Quit date: 08/30/2000  Smokeless Tobacco  . Never Used    Comment: quit smoking in 08/28/2000    Goals Met:  Independence with exercise equipment Exercise tolerated well No report of cardiac concerns or symptoms Strength training completed today  Goals Unmet:  Not Applicable  Comments: Pt able to follow exercise prescription today without complaint.  Will continue to monitor for progression.    Dr. Emily Filbert is Medical Director for [place] and [facility].

## 2017-06-22 ENCOUNTER — Encounter: Payer: Medicare Other | Admitting: *Deleted

## 2017-06-22 DIAGNOSIS — I110 Hypertensive heart disease with heart failure: Secondary | ICD-10-CM | POA: Diagnosis not present

## 2017-06-22 DIAGNOSIS — I5022 Chronic systolic (congestive) heart failure: Secondary | ICD-10-CM

## 2017-06-22 NOTE — Progress Notes (Signed)
Daily Session Note  Patient Details  Name: Jenny Cooper MRN: [id number] Date of Birth: Jan 19, 1936 Referring Provider:     Cardiac Rehab from 05/23/2017 in [facility] Cardiac and Pulmonary Rehab  Referring Provider  Ida Rogue MD      Encounter Date: 06/22/2017  Check In:     Session Check In - 06/22/17 1636      Check-In   Location [facility]-Cardiac & Pulmonary Rehab   Staff Present Renita Papa, RN BSN;Carroll Enterkin, RN, Vickki Hearing, BA, ACSM CEP, Exercise Physiologist   Supervising physician immediately available to respond to emergencies See telemetry face sheet for immediately available ER MD   Medication changes reported     No   Fall or balance concerns reported    No   Warm-up and Cool-down Performed on first and last piece of equipment   Resistance Training Performed Yes   VAD Patient? No     Pain Assessment   Currently in Pain? No/denies         History  Smoking Status  . Former Smoker  . Packs/day: 2.00  . Years: 42.00  . Types: Cigarettes  . Quit date: 08/30/2000  Smokeless Tobacco  . Never Used    Comment: quit smoking in 08/28/2000    Goals Met:  Independence with exercise equipment Exercise tolerated well No report of cardiac concerns or symptoms Strength training completed today  Goals Unmet:  Not Applicable  Comments: Pt able to follow exercise prescription today without complaint.  Will continue to monitor for progression.    Dr. Emily Filbert is Medical Director for [place] and [facility].

## 2017-06-23 ENCOUNTER — Encounter: Payer: Medicare Other | Admitting: *Deleted

## 2017-06-23 VITALS — BP 118/70 | HR 84 | Wt 162.0 lb

## 2017-06-23 DIAGNOSIS — I110 Hypertensive heart disease with heart failure: Secondary | ICD-10-CM | POA: Diagnosis not present

## 2017-06-23 DIAGNOSIS — I5022 Chronic systolic (congestive) heart failure: Secondary | ICD-10-CM

## 2017-06-23 NOTE — Progress Notes (Signed)
Daily Session Note  Patient Details  Name: Jenny Cooper MRN: [id number] Date of Birth: February 04, 1936 Referring Provider:     Cardiac Rehab from 05/23/2017 in [facility] Cardiac and Pulmonary Rehab  Referring Provider  Ida Rogue MD      Encounter Date: 06/23/2017  Check In:     Session Check In - 06/23/17 1648      Check-In   Location [facility]-Cardiac & Pulmonary Rehab   Staff Present Earlean Shawl, BS, ACSM CEP, Exercise Physiologist;Carroll Enterkin, RN, BSN;Joseph [employer] physician immediately available to respond to emergencies See telemetry face sheet for immediately available ER MD   Medication changes reported     No   Fall or balance concerns reported    No   Warm-up and Cool-down Performed on first and last piece of equipment   Resistance Training Performed Yes   VAD Patient? No     Pain Assessment   Currently in Pain? No/denies   Multiple Pain Sites No         History  Smoking Status  . Former Smoker  . Packs/day: 2.00  . Years: 42.00  . Types: Cigarettes  . Quit date: 08/30/2000  Smokeless Tobacco  . Never Used    Comment: quit smoking in 08/28/2000    Goals Met:  Independence with exercise equipment Exercise tolerated well No report of cardiac concerns or symptoms Strength training completed today  Goals Unmet:  Not Applicable  Comments: Pt able to follow exercise prescription today without complaint.  Will continue to monitor for progression.    Dr. Emily Filbert is Medical Director for [place] and [facility].

## 2017-06-27 ENCOUNTER — Encounter: Payer: Medicare Other | Admitting: *Deleted

## 2017-06-27 DIAGNOSIS — I110 Hypertensive heart disease with heart failure: Secondary | ICD-10-CM | POA: Diagnosis not present

## 2017-06-27 DIAGNOSIS — I5022 Chronic systolic (congestive) heart failure: Secondary | ICD-10-CM

## 2017-06-27 NOTE — Progress Notes (Signed)
Daily Session Note  Patient Details  Name: Jenny Cooper MRN: [id number] Date of Birth: 01-Oct-1935 Referring Provider:     Cardiac Rehab from 05/23/2017 in [facility] Cardiac and Pulmonary Rehab  Referring Provider  Ida Rogue MD      Encounter Date: 06/27/2017  Check In:     Session Check In - 06/27/17 1710      Check-In   Location [facility]-Cardiac & Pulmonary Rehab   Staff Present Nada Maclachlan, BA, ACSM CEP, Exercise Physiologist;Tametria Aho Amedeo Plenty, BS, ACSM CEP, Exercise Physiologist;Meredith Sherryll Burger, RN BSN   Supervising physician immediately available to respond to emergencies See telemetry face sheet for immediately available ER MD   Medication changes reported     No   Fall or balance concerns reported    No   Warm-up and Cool-down Performed on first and last piece of equipment   Resistance Training Performed Yes   VAD Patient? No     Pain Assessment   Currently in Pain? No/denies   Multiple Pain Sites No         History  Smoking Status  . Former Smoker  . Packs/day: 2.00  . Years: 42.00  . Types: Cigarettes  . Quit date: 08/30/2000  Smokeless Tobacco  . Never Used    Comment: quit smoking in 08/28/2000    Goals Met:  Independence with exercise equipment Exercise tolerated well No report of cardiac concerns or symptoms Strength training completed today  Goals Unmet:  Not Applicable  Comments: Pt able to follow exercise prescription today without complaint.  Will continue to monitor for progression.    Dr. Emily Filbert is Medical Director for [place] and [facility].

## 2017-06-29 ENCOUNTER — Encounter: Payer: Medicare Other | Admitting: *Deleted

## 2017-06-29 DIAGNOSIS — I110 Hypertensive heart disease with heart failure: Secondary | ICD-10-CM | POA: Diagnosis not present

## 2017-06-29 DIAGNOSIS — I5022 Chronic systolic (congestive) heart failure: Secondary | ICD-10-CM

## 2017-06-29 NOTE — Progress Notes (Signed)
Daily Session Note  Patient Details  Name: Jenny Cooper MRN: [id number] Date of Birth: 07-Jan-1936 Referring Provider:     Cardiac Rehab from 05/23/2017 in [facility] Cardiac and Pulmonary Rehab  Referring Provider  Ida Rogue MD      Encounter Date: 06/29/2017  Check In:     Session Check In - 06/29/17 1703      Check-In   Staff Present [name] Lark, RN, BSN, Lance Sell, BA, ACSM CEP, Exercise Physiologist;Meredith Sherryll Burger, RN BSN   Supervising physician immediately available to respond to emergencies See telemetry face sheet for immediately available ER MD   Medication changes reported     No   Fall or balance concerns reported    No   Warm-up and Cool-down Performed on first and last piece of equipment   Resistance Training Performed Yes   VAD Patient? No     Pain Assessment   Currently in Pain? No/denies           Exercise Prescription Changes - 06/29/17 1200      Response to Exercise   Blood Pressure (Admit) 104/62   Blood Pressure (Exercise) 118/76   Blood Pressure (Exit) 102/56   Heart Rate (Admit) 76 bpm   Heart Rate (Exercise) 74 bpm   Heart Rate (Exit) 60 bpm   Rating of Perceived Exertion (Exercise) 13   Comments none   Duration Continue with 45 min of aerobic exercise without signs/symptoms of physical distress.   Intensity THRR unchanged     Progression   Progression Continue to progress workloads to maintain intensity without signs/symptoms of physical distress.   Average METs 1.9     Resistance Training   Training Prescription Yes   Weight 3 lb   Reps 10-15     Interval Training   Interval Training No     Oxygen   Oxygen Continuous   Liters 2     Treadmill   MPH 1.1   Grade 0   Minutes 15   METs 1.92     NuStep   Level 1   SPM 80   Minutes 15   METs 1.9      History  Smoking Status  . Former Smoker  . Packs/day: 2.00  . Years: 42.00  . Types: Cigarettes  . Quit date: 08/30/2000  Smokeless Tobacco  . Never Used     Comment: quit smoking in 08/28/2000    Goals Met:  Exercise tolerated well No report of cardiac concerns or symptoms Strength training completed today  Goals Unmet:  Not Applicable  Comments: Doing well with exercise prescription progression.    Dr. Emily Filbert is Medical Director for [place] and [facility].

## 2017-06-30 ENCOUNTER — Encounter: Payer: Medicare Other | Attending: Cardiovascular Disease

## 2017-06-30 DIAGNOSIS — I483 Typical atrial flutter: Secondary | ICD-10-CM | POA: Insufficient documentation

## 2017-06-30 DIAGNOSIS — K219 Gastro-esophageal reflux disease without esophagitis: Secondary | ICD-10-CM | POA: Insufficient documentation

## 2017-06-30 DIAGNOSIS — E785 Hyperlipidemia, unspecified: Secondary | ICD-10-CM | POA: Insufficient documentation

## 2017-06-30 DIAGNOSIS — Z87891 Personal history of nicotine dependence: Secondary | ICD-10-CM | POA: Insufficient documentation

## 2017-06-30 DIAGNOSIS — J449 Chronic obstructive pulmonary disease, unspecified: Secondary | ICD-10-CM | POA: Insufficient documentation

## 2017-06-30 DIAGNOSIS — I110 Hypertensive heart disease with heart failure: Secondary | ICD-10-CM | POA: Insufficient documentation

## 2017-06-30 DIAGNOSIS — I480 Paroxysmal atrial fibrillation: Secondary | ICD-10-CM | POA: Insufficient documentation

## 2017-06-30 DIAGNOSIS — E039 Hypothyroidism, unspecified: Secondary | ICD-10-CM | POA: Insufficient documentation

## 2017-06-30 DIAGNOSIS — Z7901 Long term (current) use of anticoagulants: Secondary | ICD-10-CM | POA: Insufficient documentation

## 2017-06-30 DIAGNOSIS — I2510 Atherosclerotic heart disease of native coronary artery without angina pectoris: Secondary | ICD-10-CM | POA: Insufficient documentation

## 2017-06-30 DIAGNOSIS — I252 Old myocardial infarction: Secondary | ICD-10-CM | POA: Insufficient documentation

## 2017-06-30 DIAGNOSIS — Z950 Presence of cardiac pacemaker: Secondary | ICD-10-CM | POA: Insufficient documentation

## 2017-06-30 DIAGNOSIS — Z79899 Other long term (current) drug therapy: Secondary | ICD-10-CM | POA: Insufficient documentation

## 2017-06-30 DIAGNOSIS — Z951 Presence of aortocoronary bypass graft: Secondary | ICD-10-CM | POA: Insufficient documentation

## 2017-06-30 DIAGNOSIS — I5022 Chronic systolic (congestive) heart failure: Secondary | ICD-10-CM | POA: Insufficient documentation

## 2017-06-30 NOTE — Progress Notes (Signed)
Daily Session Note  Patient Details  Name: Jenny Cooper MRN: [id number] Date of Birth: 09-26-1935 Referring Provider:     Cardiac Rehab from 05/23/2017 in [facility] Cardiac and Pulmonary Rehab  Referring Provider  Ida Rogue MD      Encounter Date: 06/29/2017  Check In:     Session Check In - 06/29/17 1703      Check-In   Staff Present [name] Lark, RN, BSN, Lance Sell, BA, ACSM CEP, Exercise Physiologist;Meredith Sherryll Burger, RN BSN   Supervising physician immediately available to respond to emergencies See telemetry face sheet for immediately available ER MD   Medication changes reported     No   Fall or balance concerns reported    No   Warm-up and Cool-down Performed on first and last piece of equipment   Resistance Training Performed Yes   VAD Patient? No     Pain Assessment   Currently in Pain? No/denies           Exercise Prescription Changes - 06/30/17 1400      Home Exercise Plan   Plans to continue exercise at [facility]  considering FF/ walking at home   Frequency Add 1 additional day to program exercise sessions.   Initial Home Exercises Provided 06/29/17      History  Smoking Status  . Former Smoker  . Packs/day: 2.00  . Years: 42.00  . Types: Cigarettes  . Quit date: 08/30/2000  Smokeless Tobacco  . Never Used    Comment: quit smoking in 08/28/2000    Goals Met:  Independence with exercise equipment Exercise tolerated well No report of cardiac concerns or symptoms Strength training completed today  Goals Unmet:  Not Applicable  Comments: Reviewed home exercise with pt today.  Pt plans to walk for exercise.  Reviewed THR, pulse, RPE, sign and symptoms, NTG use, and when to call 911 or MD.  Also discussed weather considerations and indoor options.  Pt voiced understanding.    Dr. Emily Filbert is Medical Director for [place] and [facility].

## 2017-07-04 ENCOUNTER — Encounter: Payer: Medicare Other | Admitting: *Deleted

## 2017-07-04 DIAGNOSIS — I5022 Chronic systolic (congestive) heart failure: Secondary | ICD-10-CM

## 2017-07-04 DIAGNOSIS — Z87891 Personal history of nicotine dependence: Secondary | ICD-10-CM | POA: Diagnosis not present

## 2017-07-04 DIAGNOSIS — Z951 Presence of aortocoronary bypass graft: Secondary | ICD-10-CM | POA: Diagnosis not present

## 2017-07-04 DIAGNOSIS — E785 Hyperlipidemia, unspecified: Secondary | ICD-10-CM | POA: Diagnosis not present

## 2017-07-04 DIAGNOSIS — I2510 Atherosclerotic heart disease of native coronary artery without angina pectoris: Secondary | ICD-10-CM | POA: Diagnosis not present

## 2017-07-04 DIAGNOSIS — Z79899 Other long term (current) drug therapy: Secondary | ICD-10-CM | POA: Diagnosis not present

## 2017-07-04 DIAGNOSIS — Z7901 Long term (current) use of anticoagulants: Secondary | ICD-10-CM | POA: Diagnosis not present

## 2017-07-04 DIAGNOSIS — I252 Old myocardial infarction: Secondary | ICD-10-CM | POA: Diagnosis not present

## 2017-07-04 DIAGNOSIS — E039 Hypothyroidism, unspecified: Secondary | ICD-10-CM | POA: Diagnosis not present

## 2017-07-04 DIAGNOSIS — I110 Hypertensive heart disease with heart failure: Secondary | ICD-10-CM | POA: Diagnosis not present

## 2017-07-04 DIAGNOSIS — I480 Paroxysmal atrial fibrillation: Secondary | ICD-10-CM | POA: Diagnosis not present

## 2017-07-04 DIAGNOSIS — Z950 Presence of cardiac pacemaker: Secondary | ICD-10-CM | POA: Diagnosis not present

## 2017-07-04 DIAGNOSIS — J449 Chronic obstructive pulmonary disease, unspecified: Secondary | ICD-10-CM | POA: Diagnosis not present

## 2017-07-04 DIAGNOSIS — K219 Gastro-esophageal reflux disease without esophagitis: Secondary | ICD-10-CM | POA: Diagnosis not present

## 2017-07-04 DIAGNOSIS — I483 Typical atrial flutter: Secondary | ICD-10-CM | POA: Diagnosis not present

## 2017-07-04 NOTE — Progress Notes (Signed)
Daily Session Note  Patient Details  Name: Jenny Cooper MRN: [id number] Date of Birth: 07/22/1936 Referring Provider:     Cardiac Rehab from 05/23/2017 in [facility] Cardiac and Pulmonary Rehab  Referring Provider  Ida Rogue MD      Encounter Date: 07/04/2017  Check In: Session Check In - 07/04/17 1705      Check-In   Location  [facility]-Cardiac & Pulmonary Rehab    Staff Present  Renita Papa, RN Moises Blood, BS, ACSM CEP, Exercise Physiologist;Amanda Oletta Darter, [place], ACSM CEP, Exercise Physiologist    Supervising physician immediately available to respond to emergencies  See telemetry face sheet for immediately available ER MD    Medication changes reported      No    Fall or balance concerns reported     No    Warm-up and Cool-down  Performed on first and last piece of equipment    Resistance Training Performed  Yes    VAD Patient?  No      Pain Assessment   Currently in Pain?  No/denies    Multiple Pain Sites  No          Social History   Tobacco Use  Smoking Status Former Smoker  . Packs/day: 2.00  . Years: 42.00  . Pack years: 84.00  . Types: Cigarettes  . Last attempt to quit: 08/30/2000  . Years since quitting: 16.8  Smokeless Tobacco Never Used  Tobacco Comment   quit smoking in 08/28/2000    Goals Met:  Independence with exercise equipment Exercise tolerated well No report of cardiac concerns or symptoms Strength training completed today  Goals Unmet:  Not Applicable  Comments: Pt able to follow exercise prescription today without complaint.  Will continue to monitor for progression.    Dr. Emily Filbert is Medical Director for [place] and [facility].

## 2017-07-06 ENCOUNTER — Encounter: Payer: Self-pay | Admitting: *Deleted

## 2017-07-06 DIAGNOSIS — I5022 Chronic systolic (congestive) heart failure: Secondary | ICD-10-CM

## 2017-07-06 NOTE — Progress Notes (Signed)
Cardiac Individual Treatment Plan  Patient Details  Name: Jenny Cooper MRN: [id number] Date of Birth: 1936-01-02 Referring Provider:     Cardiac Rehab from 05/23/2017 in [facility] Cardiac and Pulmonary Rehab  Referring Provider  Ida Rogue MD      Initial Encounter Date:    Cardiac Rehab from 05/23/2017 in [facility] Cardiac and Pulmonary Rehab  Date  05/23/17  Referring Provider  Ida Rogue MD      Visit Diagnosis: Heart failure, chronic systolic ([place])  Patient's Home Medications on Admission:  Current Outpatient Medications:  .  acetaminophen (TYLENOL) 500 MG tablet, Take 1,000 mg by mouth every 6 (six) hours. , Disp: , Rfl:  .  amiodarone (PACERONE) 200 MG tablet, Take 200 mg by mouth daily. , Disp: , Rfl:  .  apixaban (ELIQUIS) 5 MG TABS tablet, Take 5 mg by mouth 2 (two) times daily. , Disp: , Rfl:  .  atorvastatin (LIPITOR) 20 MG tablet, TAKE 1 TABLET (20 MG TOTAL) BY MOUTH AT BEDTIME., Disp: 30 tablet, Rfl: 5 .  Calcium Carb-Cholecalciferol (CALCIUM 600-D PO), Take 1 tablet by mouth every morning. , Disp: , Rfl:  .  Cetirizine HCl 10 MG CAPS, Take 1 capsule (10 mg total) by mouth 1 day or 1 dose. (Patient taking differently: Take 1 capsule by mouth daily. ), Disp: 30 capsule, Rfl: 5 .  furosemide (LASIX) 40 MG tablet, Take 1 tablet (40 mg total) by mouth 2 (two) times daily. (Patient taking differently: Take 40 mg by mouth 2 (two) times daily. Except on Sunday.), Disp: 60 tablet, Rfl: 6 .  guaiFENesin-codeine 100-10 MG/5ML syrup, Take 5 mLs by mouth every 4 (four) hours as needed for cough., Disp: 240 mL, Rfl: 0 .  ipratropium-albuterol (DUONEB) 0.5-2.5 (3) MG/3ML SOLN, Take 3 mLs by nebulization every 6 (six) hours as needed., Disp: , Rfl:  .  levothyroxine (SYNTHROID, LEVOTHROID) 75 MCG tablet, TAKE 1 TABLET (75 MCG TOTAL) BY MOUTH DAILY BEFORE BREAKFAST., Disp: 30 tablet, Rfl: 5 .  metoprolol succinate (TOPROL-XL) 25 MG 24 hr tablet, TAKE 1 TABLET BY MOUTH EVERY MORNING AND  2 TABS IN THE EVENING, Disp: 90 tablet, Rfl: 3 .  predniSONE (STERAPRED UNI-PAK 21 TAB) 10 MG (21) TBPK tablet, Take as directed with food. (Patient not taking: Reported on 05/23/2017), Disp: 21 tablet, Rfl: 0 .  sacubitril-valsartan (ENTRESTO) 24-26 MG, Take 1 tablet by mouth 2 (two) times daily., Disp: 60 tablet, Rfl: 6 .  senna (SENOKOT) 8.6 MG TABS tablet, Take 1 tablet (8.6 mg total) by mouth daily., Disp: 30 each, Rfl: 0 .  umeclidinium bromide (INCRUSE ELLIPTA) 62.5 MCG/INH AEPB, Inhale 1 puff into the lungs daily., Disp: 1 each, Rfl: 0 No current facility-administered medications for this visit.   Facility-Administered Medications Ordered in Other Visits:  .  sodium chloride 0.9 % injection 10 mL, 10 mL, Intravenous, PRN, Choksi, Janak, MD, 10 mL at 02/19/15 1000 .  sodium chloride flush (NS) 0.9 % injection 10 mL, 10 mL, Intravenous, PRN, Cammie Sickle, MD, 10 mL at 02/16/16 1048  Past Medical History: Past Medical History:  Diagnosis Date  . Atypical atrial flutter ([place])    a. s/p ablation 07/27/2013 followed by Dr. Rockey Situ  . CAD (coronary artery disease)    a. s/p MI x 2 in 2002 s/p PCI x 2 in 2002; b. s/p 2v CABG 2002; c. stress echo 07/2004 w/ evi of pos & inf infarct & no evi of ischemia; d. 4/08 dipyridamole scan w/ multiple  areas of infarct, no ischemia, EF 49%; e. cath 04/28/15 3v CAD, med Rx rec, no targets for revasc, LM lum irregs, pLAD 30%, 100%, ost-pLCx 60%, [name] 99%, OM2 100%, p-mRCA 90%, [name] 100% L-R collats, VG-mLAD irregs, VG-OM2 oc  . Carcinoma of right lung ([place]) 01/03/2015   a. followed by Dr. Oliva Bustard  . Chronic systolic CHF (congestive heart failure) ([place])    a. echo 03/2015: EF 30-35%, sev ant/inf/pos HK, in mild to mod MR  . Complication of anesthesia    more recently patient oxygen levels do not rebound as quickly  . Compressed spine fracture ([place]) 08/06/2016   lumbar 2, t11, t12  . COPD (chronic obstructive pulmonary disease) ([place])   . Fractured  pelvis ([place]) 05/26/2016   2 places  . GERD (gastroesophageal reflux disease)   . History of blood clots    12/2001  . History of colonoscopy 2013  . History of mammography, screening 2015  . History of Papanicolaou smear of cervix 2013  . HLD (hyperlipidemia)   . HTN (hypertension)   . Hypothyroidism   . Lung cancer ([place])   . Mitral regurgitation    a. s/p mitral ring placement 09/2000; b. echo 09/2010: EF 50%, inf HK, post AK, mild MR, prosthetic mitral valve ring w/ peak gradient of 10 mmHg; b. echo 2/13: EF 50%, mild MR/TR     . Myocardial infarction ([place])    X 2 (LAST ONE IN 2002)  . Neuropathy   . Pacemaker    a. MDT 2002; b. generator replacement 2013; c. followed by Dr. Omelia Blackwater, MD  . PAF (paroxysmal atrial fibrillation) ([place])    a. on Eliquis   . Personal history of chemotherapy   . Personal history of radiation therapy     Tobacco Use: Social History   Tobacco Use  Smoking Status Former Smoker  . Packs/day: 2.00  . Years: 42.00  . Pack years: 84.00  . Types: Cigarettes  . Last attempt to quit: 08/30/2000  . Years since quitting: 16.8  Smokeless Tobacco Never Used  Tobacco Comment   quit smoking in 08/28/2000    Labs: Recent Review Flowsheet Data    Labs for ITP Cardiac and Pulmonary Rehab Latest Ref Rng & Units 01/05/2016 02/16/2016 06/29/2016 12/27/2016 04/06/2017   Cholestrol 100 - 199 mg/dL - 128 - - -   LDLCALC 0 - 99 mg/dL - 45 - - -   HDL >39 mg/dL - 56 - - -   Trlycerides 0 - 149 mg/dL - 133 - - -   Hemoglobin A1c 4.8 - 5.6 % 7.5(H) - 6.3(H) 5.3 -   HCO3 20.0 - 28.0 mmol/L - - - - 36.1(H)   ACIDBASEDEF 0.0 - 2.0 mmol/L - - - - -       Exercise Target Goals:    Exercise Program Goal: Individual exercise prescription set with THRR, safety & activity barriers. Participant demonstrates ability to understand and report RPE using BORG scale, to self-measure pulse accurately, and to acknowledge the importance of the exercise prescription.  Exercise  Prescription Goal: Starting with aerobic activity 30 plus minutes a day, 3 days per week for initial exercise prescription. Provide home exercise prescription and guidelines that participant acknowledges understanding prior to discharge.  Activity Barriers & Risk Stratification: Activity Barriers & Cardiac Risk Stratification - 05/23/17 1337      Activity Barriers & Cardiac Risk Stratification   Activity Barriers  Back Problems;Joint Problems;Shortness of Breath;Decreased Ventricular Function;Muscular Weakness;Neck/Spine Problems;Deconditioning    Cardiac  Risk Stratification  High       6 Minute Walk: 6 Minute Walk    Row Name 05/23/17 1622         6 Minute Walk   Phase  Initial     Distance  667 feet     Walk Time  4.9 minutes     # of Rest Breaks  2 21 sec, 45 sec     MPH  1.54     METS  1.19     RPE  17     Perceived Dyspnea   2     VO2 Peak  4.18     Symptoms  Yes (comment)     Comments  back pain 7-8/10 (chronic), pulling e-cylinder as she left her tank in the car     Resting HR  62 bpm     Resting BP  124/74     Resting Oxygen Saturation   94 %     Exercise Oxygen Saturation  during 6 min walk  91 %     Max Ex. HR  91 bpm     Max Ex. BP  132/70     2 Minute Post BP  120/60       Interval HR   1 Minute HR  93     2 Minute HR  91     3 Minute HR  89     4 Minute HR  88     5 Minute HR  97     6 Minute HR  78     2 Minute Post HR  87     Interval Heart Rate?  Yes       Interval Oxygen   Interval Oxygen?  Yes     Baseline Oxygen Saturation %  94 %     1 Minute Oxygen Saturation %  91 %     1 Minute Liters of Oxygen  2 L     2 Minute Oxygen Saturation %  91 %     2 Minute Liters of Oxygen  2 L     3 Minute Oxygen Saturation %  95 %     3 Minute Liters of Oxygen  2 L     4 Minute Oxygen Saturation %  95 %     4 Minute Liters of Oxygen  2 L     5 Minute Oxygen Saturation %  95 %     5 Minute Liters of Oxygen  2 L     6 Minute Oxygen Saturation %  95 %      6 Minute Liters of Oxygen  2 L     2 Minute Post Oxygen Saturation %  91 %     2 Minute Post Liters of Oxygen  2 L        Oxygen Initial Assessment: Oxygen Initial Assessment - 06/23/17 1747      Initial 6 min Walk   Oxygen Used  Continuous    Liters per minute  2       Oxygen Re-Evaluation: Oxygen Re-Evaluation    Row Name 06/08/17 1629 06/15/17 1834           Program Oxygen Prescription   Program Oxygen Prescription  Continuous  -      Liters per minute  2  2        Home Oxygen   Home Oxygen Device  E-Tanks  -      Liters per  minute  2  -      Home Exercise Oxygen Prescription  Continuous  -      Liters per minute  2  -      Home at Rest Exercise Oxygen Prescription  Continuous  -      Liters per minute  2  -      Compliance with Home Oxygen Use  Yes  -        Goals/Expected Outcomes   Short Term Goals  To learn and exhibit compliance with exercise, home and travel O2 prescription;To learn and understand importance of monitoring SPO2 with pulse oximeter and demonstrate accurate use of the pulse oximeter.;To learn and understand importance of maintaining oxygen saturations>88%;To learn and demonstrate proper pursed lip breathing techniques or other breathing techniques.;To learn and demonstrate proper use of respiratory medications  -      Long  Term Goals  Exhibits compliance with exercise, home and travel O2 prescription;Verbalizes importance of monitoring SPO2 with pulse oximeter and return demonstration;Maintenance of O2 saturations>88%;Exhibits proper breathing techniques, such as pursed lip breathing or other method taught during program session  -         Oxygen Discharge (Final Oxygen Re-Evaluation): Oxygen Re-Evaluation - 06/15/17 1834      Program Oxygen Prescription   Liters per minute  2       Initial Exercise Prescription: Initial Exercise Prescription - 05/23/17 1600      Date of Initial Exercise RX and Referring Provider   Date  05/23/17     Referring Provider  Ida Rogue MD      Oxygen   Oxygen  Continuous    Liters  2      Treadmill   MPH  1.5    Grade  0    Minutes  15    METs  2.15      NuStep   Level  1    SPM  80    Minutes  15    METs  1.5      Arm Ergometer   Level  1    Watts  12    RPM  50    Minutes  15    METs  1.5      Prescription Details   Frequency (times per week)  3    Duration  Progress to 45 minutes of aerobic exercise without signs/symptoms of physical distress      Intensity   THRR 40-80% of Max Heartrate  94-125    Ratings of Perceived Exertion  11-13    Perceived Dyspnea  0-4      Progression   Progression  Continue to progress workloads to maintain intensity without signs/symptoms of physical distress.      Resistance Training   Training Prescription  Yes    Weight  3 lbs    Reps  10-15       Perform Capillary Blood Glucose checks as needed.  Exercise Prescription Changes: Exercise Prescription Changes    Row Name 05/23/17 1300 06/16/17 1400 06/29/17 1200 06/30/17 1400       Response to Exercise   Blood Pressure (Admit)  124/72  112/62  104/62  -    Blood Pressure (Exercise)  132/70  126/68  118/76  -    Blood Pressure (Exit)  120/60  98/60  102/56  -    Heart Rate (Admit)  62 bpm  77 bpm  76 bpm  -    Heart Rate (Exercise)  91 bpm  92 bpm  74 bpm  -    Heart Rate (Exit)  73 bpm  63 bpm  60 bpm  -    Oxygen Saturation (Admit)  94 %  -  -  -    Oxygen Saturation (Exercise)  95 %  -  -  -    Rating of Perceived Exertion (Exercise)  17  13  13   -    Perceived Dyspnea (Exercise)  2  -  -  -    Symptoms  back pain 7-8/10  -  -  -    Comments  walk test results  back hurt on TM - 02 tank low  none  -    Duration  -  Continue with 45 min of aerobic exercise without signs/symptoms of physical distress.  Continue with 45 min of aerobic exercise without signs/symptoms of physical distress.  -    Intensity  -  THRR unchanged  THRR unchanged  -      Progression    Progression  -  Continue to progress workloads to maintain intensity without signs/symptoms of physical distress.  Continue to progress workloads to maintain intensity without signs/symptoms of physical distress.  -    Average METs  -  2.35  1.9  -      Resistance Training   Training Prescription  -  Yes  Yes  -    Weight  -  3 lb  3 lb  -    Reps  -  10-15  10-15  -      Interval Training   Interval Training  -  No  No  -      Oxygen   Oxygen  Continuous  Continuous  Continuous  -    Liters  2  2  2   -      Treadmill   MPH  -  1.4  1.1  -    Grade  -  0  0  -    Minutes  -  15  15  -    METs  -  2.07  1.92  -      NuStep   Level  -  -  1  -    SPM  -  -  80  -    Minutes  -  -  15  -    METs  -  -  1.9  -      Arm Ergometer   Level  -  1  -  -    RPM  -  50  -  -    Minutes  -  15  -  -    METs  -  1.8  -  -      Home Exercise Plan   Plans to continue exercise at  -  -  [facility] considering FF/ walking at home    Frequency  -  -  -  Add 1 additional day to program exercise sessions.    Initial Home Exercises Provided  -  -  -  06/29/17       Exercise Comments: Exercise Comments    Row Name 06/01/17 1732 06/15/17 1809 06/30/17 1437       Exercise Comments  First full day of exercise!  Patient was oriented to gym and equipment including functions, settings, policies, and procedures.  Patient's individual exercise prescription and treatment plan were reviewed.  All starting workloads were established based on the results of the 6 minute walk test done at initial orientation visit.  The plan for exercise progression was also introduced and progression will be customized based on patient's performance and goals.  Jenny Cooper had some back pain today so she will try not doing TM and see if that helps.  Home exercise goals reviewed - RPE, THR, safety. Pt voiced understanding        Exercise Goals and Review: Exercise Goals    Row Name 05/23/17 1628              Exercise Goals   Increase Physical Activity  Yes       Intervention  Provide advice, education, support and counseling about physical activity/exercise needs.;Develop an individualized exercise prescription for aerobic and resistive training based on initial evaluation findings, risk stratification, comorbidities and participant's personal goals.       Expected Outcomes  Achievement of increased cardiorespiratory fitness and enhanced flexibility, muscular endurance and strength shown through measurements of functional capacity and personal statement of participant.       Increase Strength and Stamina  Yes       Intervention  Provide advice, education, support and counseling about physical activity/exercise needs.;Develop an individualized exercise prescription for aerobic and resistive training based on initial evaluation findings, risk stratification, comorbidities and participant's personal goals.       Expected Outcomes  Achievement of increased cardiorespiratory fitness and enhanced flexibility, muscular endurance and strength shown through measurements of functional capacity and personal statement of participant.       Able to understand and use rate of perceived exertion (RPE) scale  Yes       Intervention  Provide education and explanation on how to use RPE scale       Expected Outcomes  Short Term: Able to use RPE daily in rehab to express subjective intensity level;Long Term:  Able to use RPE to guide intensity level when exercising independently       Able to understand and use Dyspnea scale  Yes       Intervention  Provide education and explanation on how to use Dyspnea scale       Expected Outcomes  Short Term: Able to use Dyspnea scale daily in rehab to express subjective sense of shortness of breath during exertion;Long Term: Able to use Dyspnea scale to guide intensity level when exercising independently       Knowledge and understanding of Target Heart Rate Range (THRR)  Yes        Intervention  Provide education and explanation of THRR including how the numbers were predicted and where they are located for reference       Expected Outcomes  Short Term: Able to state/look up THRR;Long Term: Able to use THRR to govern intensity when exercising independently;Short Term: Able to use daily as guideline for intensity in rehab       Able to check pulse independently  Yes       Intervention  Provide education and demonstration on how to check pulse in carotid and radial arteries.;Review the importance of being able to check your own pulse for safety during independent exercise       Expected Outcomes  Short Term: Able to explain why pulse checking is important during independent exercise;Long Term: Able to check pulse independently and accurately       Understanding of Exercise Prescription  Yes       Intervention  Provide education,  explanation, and written materials on patient's individual exercise prescription       Expected Outcomes  Short Term: Able to explain program exercise prescription;Long Term: Able to explain home exercise prescription to exercise independently          Exercise Goals Re-Evaluation : Exercise Goals Re-Evaluation    Row Name 06/01/17 1732 06/16/17 1451 06/22/17 1701 06/29/17 1203 06/30/17 1437     Exercise Goal Re-Evaluation   Exercise Goals Review  Increase Physical Activity;Increase Strength and Stamina;Able to understand and use rate of perceived exertion (RPE) scale  Increase Physical Activity;Increase Strength and Stamina  Increase Physical Activity;Increase Strength and Stamina  Increase Physical Activity;Increase Strength and Stamina  Increase Physical Activity;Increase Strength and Stamina;Able to understand and use rate of perceived exertion (RPE) scale;Knowledge and understanding of Target Heart Rate Range (THRR)   Comments  Reviewed RPE scale, THR and program prescription with pt today.  Pt voiced understanding and was given a copy of goals to  take home.   Jenny Cooper feels her back is worse since starting class.  Staff recommended she leave off TM for the next couple sessions and see if that helps with pain.  Staff also recommended she see PT.  Jenny Cooper has been doing chair squats at home.  She feels this has helped her back and legs.  Jenny Cooper has done better on the TM with reduced speed.  Staff will continue to monitor progress.  Reviewed RPE scale, THR and home exercise with pt today.  Pt voiced understanding and was given a copy of goals to take home.    Expected Outcomes  Short: Use RPE daily to regulate intensity.  Long: Follow program prescription in THR.  Short - Samyah wil do seated exercise and back pain will resolve.  Long - swathi will slowly add walking back into her program.  Short - Staff will review home exercise Shirley will add one day of exercise outside HT sessions.  Short - Midge will continue to walk on TM without pain.  Long - Kendall will be able to increase speed on TM.    Short - add one day to program exercise on her own.  Long - exercise 3-5 days per week independently      Discharge Exercise Prescription (Final Exercise Prescription Changes): Exercise Prescription Changes - 06/30/17 1400      Home Exercise Plan   Plans to continue exercise at  [facility] considering FF/ walking at home   considering FF/ walking at home   Frequency  Add 1 additional day to program exercise sessions.    Initial Home Exercises Provided  06/29/17       Nutrition:  Target Goals: Understanding of nutrition guidelines, daily intake of sodium <1533m, cholesterol <2066m calories 30% from fat and 7% or less from saturated fats, daily to have 5 or more servings of fruits and vegetables.  Biometrics: Pre Biometrics - 05/23/17 1628      Pre Biometrics   Height  5' 5"  (1.651 m)    Weight  164 lb 4.8 oz (74.5 kg)    Waist Circumference  39 inches    Hip Circumference  38 inches    Waist to Hip Ratio  1.03 %    BMI  (Calculated)  27.34    Single Leg Stand  1.02 seconds        Nutrition Therapy Plan and Nutrition Goals: Nutrition Therapy & Goals - 06/22/17 1701      Nutrition Therapy  RD appointment defered  Yes       Nutrition Discharge: Rate Your Plate Scores: Nutrition Assessments - 05/23/17 1340      MEDFICTS Scores   Pre Score  19       Nutrition Goals Re-Evaluation:   Nutrition Goals Discharge (Final Nutrition Goals Re-Evaluation):   Psychosocial: Target Goals: Acknowledge presence or absence of significant depression and/or stress, maximize coping skills, provide positive support system. Participant is able to verbalize types and ability to use techniques and skills needed for reducing stress and depression.   Initial Review & Psychosocial Screening: Initial Psych Review & Screening - 05/23/17 1340      Initial Review   Current issues with  Current Depression;Current Sleep Concerns;Current Stress Concerns Sleeping 12-14 hours a day. cannot do much of anything she likes to do because of back pain that occurs after standing for periods of time.    Sleeping 12-14 hours a day. cannot do much of anything she likes to do because of back pain that occurs after standing for periods of time.    Source of Stress Concerns  Chronic Illness;Financial    Comments  medications are coasting 300 dollars more now that she has been switched to 2 new meds.  Unable to get coupon pricing since has Medicare.  Physician is helping every now and then with samples. recent back surgery in Dec 2017      Bouse?  Yes family   family     Barriers   Psychosocial barriers to participate in program  There are no identifiable barriers or psychosocial needs.;The patient should benefit from training in stress management and relaxation.      Screening Interventions   Interventions  Encouraged to exercise;Program counselor consult;Provide feedback about the scores to  participant;To provide support and resources with identified psychosocial needs;Yes    Expected Outcomes  Short Term goal: Utilizing psychosocial counselor, staff and physician to assist with identification of specific Stressors or current issues interfering with healing process. Setting desired goal for each stressor or current issue identified.;Long Term Goal: Stressors or current issues are controlled or eliminated.;Short Term goal: Identification and review with participant of any Quality of Life or Depression concerns found by scoring the questionnaire.;Long Term goal: The participant improves quality of Life and PHQ9 Scores as seen by post scores and/or verbalization of changes       Quality of Life Scores:  Quality of Life - 05/23/17 1345      Quality of Life Scores   Health/Function Pre  14.79 %    Socioeconomic Pre  12.67 %    Psych/Spiritual Pre  10.93 %    Family Pre  30 %    GLOBAL Pre  15.52 %       PHQ-9: Recent Review Flowsheet Data    Depression screen [facility] 2/9 05/23/2017 05/18/2017 04/14/2017 03/07/2017 08/02/2016   Decreased Interest 1 0 0 0 0   Down, Depressed, Hopeless 1 3 0 0 0   PHQ - 2 Score 2 3 0 0 0   Altered sleeping 1 - - - -   Tired, decreased energy 1 - - - -   Change in appetite 1 - - - -   Feeling bad or failure about yourself  0 - - - -   Trouble concentrating 0 - - - -   Moving slowly or fidgety/restless 0 - - - -   Suicidal thoughts 0 - - - -  PHQ-9 Score 5 - - - -   Difficult doing work/chores Somewhat difficult - - - -     Interpretation of Total Score  Total Score Depression Severity:  1-4 = Minimal depression, 5-9 = Mild depression, 10-14 = Moderate depression, 15-19 = Moderately severe depression, 20-27 = Severe depression   Psychosocial Evaluation and Intervention: Psychosocial Evaluation - 06/06/17 1700      Psychosocial Evaluation & Interventions   Interventions  Encouraged to exercise with the program and follow exercise  prescription;Relaxation education;Stress management education    Comments  Counselor met with Jenny Cooper) today for initial psychosocial evaluation.  She is a 81 year old who has multiple health issues including conditions of her heart and lungs.  She is on Oxygen.  Seairra has a strong support system with a son and (2) daughters who live locally.  She reports sleeping "too much" with typically 12 hours or more/night.  She has a good appetite.  Dianely denies a history of depression or anxiety; but admits to some current symptoms with all of her health issues and some financial concerns at this time.  She states her mood is generally stable but with a back surgery earlier this year; lung cancer two years ago and now her current condition, it is difficult to be positive lately.  Lichelle's goals for this program are to get some strength back and to walk better (which she has not been able to do since her back surgery 10 months ago).  Staff will follow with Jenny Cooper throughout the course of this program.     Expected Outcomes  Jowanna will benefit from consistent exercise to achieve her stated goals.  The educational and psychoeducational components will be helpful in learning more about her condition and managing/coping more positively.  Staff will follow.     Continue Psychosocial Services   Follow up required by staff       Psychosocial Re-Evaluation: Psychosocial Re-Evaluation    Row Name 06/08/17 0716 06/22/17 1706           Psychosocial Re-Evaluation   Current issues with  Current Stress Concerns  -      Comments  Nikiesha is still using oxgyen. Still has a lot of bills due to recent illness  The cost of medication is down for 3 months due to meeting an insurance maximum but will go back up after the first of year.      Expected Outcomes  Exercise should help with stress.   Shavana understands the need for the medications long term.  Will continue to follow up with Dr.      Interventions   Encouraged to attend Cardiac Rehabilitation for the exercise  -      Continue Psychosocial Services   Follow up required by staff  -        Initial Review   Source of Stress Concerns  Financial  -         Psychosocial Discharge (Final Psychosocial Re-Evaluation): Psychosocial Re-Evaluation - 06/22/17 1706      Psychosocial Re-Evaluation   Comments  The cost of medication is down for 3 months due to meeting an insurance maximum but will go back up after the first of year.    Expected Outcomes  Adonai understands the need for the medications long term.  Will continue to follow up with Dr.       [facility]: Provide vocational rehab assistance to qualifying candidates.   Vocational Rehab Evaluation & Intervention: Vocational  Rehab - 05/23/17 1348      Initial Vocational Rehab Evaluation & Intervention   Assessment shows need for Vocational Rehabilitation  No       Education: Education Goals: Education classes will be provided on a variety of topics geared toward better understanding of heart health and risk factor modification. Participant will state understanding/return demonstration of topics presented as noted by education test scores.  Learning Barriers/Preferences: Learning Barriers/Preferences - 05/23/17 1346      Learning Barriers/Preferences   Learning Barriers  Exercise Concerns concerned about back pain   concerned about back pain   Learning Preferences  None       Education Topics: General Nutrition Guidelines/Fats and Fiber: -Group instruction provided by verbal, written material, models and posters to present the general guidelines for heart healthy nutrition. Gives an explanation and review of dietary fats and fiber.   Cardiac Rehab from 07/04/2017 in [facility] Cardiac and Pulmonary Rehab  Date  06/13/17  Educator  PI  Instruction Review Code  1- Verbalizes Understanding      Controlling Sodium/Reading Food Labels: -Group verbal and written  material supporting the discussion of sodium use in heart healthy nutrition. Review and explanation with models, verbal and written materials for utilization of the food label.   Cardiac Rehab from 07/04/2017 in [facility] Cardiac and Pulmonary Rehab  Date  06/20/17  Educator  PI  Instruction Review Code  1- Verbalizes Understanding      Exercise Physiology & Risk Factors: - Group verbal and written instruction with models to review the exercise physiology of the cardiovascular system and associated critical values. Details cardiovascular disease risk factors and the goals associated with each risk factor.   Cardiac Rehab from 07/04/2017 in [facility] Cardiac and Pulmonary Rehab  Date  06/27/17  Educator  [facility]  Instruction Review Code  1- Verbalizes Understanding      Aerobic Exercise & Resistance Training: - Gives group verbal and written discussion on the health impact of inactivity. On the components of aerobic and resistive training programs and the benefits of this training and how to safely progress through these programs.   Cardiac Rehab from 07/04/2017 in [facility] Cardiac and Pulmonary Rehab  Date  06/29/17  Educator  AS  Instruction Review Code  1- Verbalizes Understanding      Flexibility, Balance, General Exercise Guidelines: - Provides group verbal and written instruction on the benefits of flexibility and balance training programs. Provides general exercise guidelines with specific guidelines to those with heart or lung disease. Demonstration and skill practice provided.   Cardiac Rehab from 07/04/2017 in [facility] Cardiac and Pulmonary Rehab  Date  07/04/17  Educator  [facility]  Instruction Review Code  1- Verbalizes Understanding      Stress Management: - Provides group verbal and written instruction about the health risks of elevated stress, cause of high stress, and healthy ways to reduce stress.   Depression: - Provides group verbal and written instruction on the correlation between  heart/lung disease and depressed mood, treatment options, and the stigmas associated with seeking treatment.   Cardiac Rehab from 07/04/2017 in [facility] Cardiac and Pulmonary Rehab  Date  06/15/17  Educator  [facility]  Instruction Review Code  1- Verbalizes Understanding      Anatomy & Physiology of the Heart: - Group verbal and written instruction and models provide basic cardiac anatomy and physiology, with the coronary electrical and arterial systems. Review of: AMI, Angina, Valve disease, Heart Failure, Cardiac Arrhythmia, Pacemakers, and the ICD.  Cardiac Procedures: - Group verbal and written instruction to review commonly prescribed medications for heart disease. Reviews the medication, class of the drug, and side effects. Includes the steps to properly store meds and maintain the prescription regimen. (beta blockers and nitrates)   Cardiac Medications I: - Group verbal and written instruction to review commonly prescribed medications for heart disease. Reviews the medication, class of the drug, and side effects. Includes the steps to properly store meds and maintain the prescription regimen.   Cardiac Medications II: -Group verbal and written instruction to review commonly prescribed medications for heart disease. Reviews the medication, class of the drug, and side effects. (all other drug classes)    Go Sex-Intimacy & Heart Disease, Get SMART - Goal Setting: - Group verbal and written instruction through game format to discuss heart disease and the return to sexual intimacy. Provides group verbal and written material to discuss and apply goal setting through the application of the S.M.A.R.T. Method.   Other Matters of the Heart: - Provides group verbal, written materials and models to describe Heart Failure, Angina, Valve Disease, Peripheral Artery Disease, and Diabetes in the realm of heart disease. Includes description of the disease process and treatment options available to the  cardiac patient.   Exercise & Equipment Safety: - Individual verbal instruction and demonstration of equipment use and safety with use of the equipment.   Cardiac Rehab from 07/04/2017 in [facility] Cardiac and Pulmonary Rehab  Date  05/23/17  Educator  SB  Instruction Review Code  1- Verbalizes Understanding      Infection Prevention: - Provides verbal and written material to individual with discussion of infection control including proper hand washing and proper equipment cleaning during exercise session.   Cardiac Rehab from 07/04/2017 in [facility] Cardiac and Pulmonary Rehab  Date  05/23/17  Educator  SB  Instruction Review Code  1- Verbalizes Understanding      Falls Prevention: - Provides verbal and written material to individual with discussion of falls prevention and safety.   Cardiac Rehab from 07/04/2017 in [facility] Cardiac and Pulmonary Rehab  Date  05/23/17  Educator  SB  Instruction Review Code  1- Verbalizes Understanding      Diabetes: - Individual verbal and written instruction to review signs/symptoms of diabetes, desired ranges of glucose level fasting, after meals and with exercise. Acknowledge that pre and post exercise glucose checks will be done for 3 sessions at entry of program.   Other: -Provides group and verbal instruction on various topics (see comments)    Knowledge Questionnaire Score: Knowledge Questionnaire Score - 05/23/17 1347      Knowledge Questionnaire Score   Pre Score  24/28 reviewed correct responses with Jenny Cooper. she had no further questions and verbalized understanding of the correct responses   reviewed correct responses with Jenny Cooper. she had no further questions and verbalized understanding of the correct responses      Core Components/Risk Factors/Patient Goals at Admission: Personal Goals and Risk Factors at Admission - 05/23/17 1348      Core Components/Risk Factors/Patient Goals on Admission    Weight Management  Yes;Weight Loss     Intervention  Weight Management: Develop a combined nutrition and exercise program designed to reach desired caloric intake, while maintaining appropriate intake of nutrient and fiber, sodium and fats, and appropriate energy expenditure required for the weight goal.;Weight Management: Provide education and appropriate resources to help participant work on and attain dietary goals.;Weight Management/Obesity: Establish reasonable short term and long term weight goals.  Admit Weight  164 lb 4.8 oz (74.5 kg)    Goal Weight: Short Term  132 lb (59.9 kg)    Goal Weight: Long Term  148 lb (67.1 kg)    Expected Outcomes  Short Term: Continue to assess and modify interventions until short term weight is achieved;Long Term: Adherence to nutrition and physical activity/exercise program aimed toward attainment of established weight goal;Weight Loss: Understanding of general recommendations for a balanced deficit meal plan, which promotes 1-2 lb weight loss per week and includes a negative energy balance of [phone] kcal/d;Understanding recommendations for meals to include 15-35% energy as protein, 25-35% energy from fat, 35-60% energy from carbohydrates, less than 278m of dietary cholesterol, 20-35 gm of total fiber daily;Understanding of distribution of calorie intake throughout the day with the consumption of 4-5 meals/snacks    Heart Failure  Yes    Intervention  Provide a combined exercise and nutrition program that is supplemented with education, support and counseling about heart failure. Directed toward relieving symptoms such as shortness of breath, decreased exercise tolerance, and extremity edema.    Expected Outcomes  Improve functional capacity of life;Short term: Attendance in program 2-3 days a week with increased exercise capacity. Reported lower sodium intake. Reported increased fruit and vegetable intake. Reports medication compliance.;Short term: Daily weights obtained and reported for increase.  Utilizing diuretic protocols set by physician.;Long term: Adoption of self-care skills and reduction of barriers for early signs and symptoms recognition and intervention leading to self-care maintenance.    Hypertension  Yes    Intervention  Provide education on lifestyle modifcations including regular physical activity/exercise, weight management, moderate sodium restriction and increased consumption of fresh fruit, vegetables, and low fat dairy, alcohol moderation, and smoking cessation.;Monitor prescription use compliance.    Expected Outcomes  Short Term: Continued assessment and intervention until BP is < 140/950mHG in hypertensive participants. < 130/808mG in hypertensive participants with diabetes, heart failure or chronic kidney disease.;Long Term: Maintenance of blood pressure at goal levels.    Lipids  Yes    Intervention  Provide education and support for participant on nutrition & aerobic/resistive exercise along with prescribed medications to achieve LDL <77m59mDL >40mg44m Expected Outcomes  Short Term: Participant states understanding of desired cholesterol values and is compliant with medications prescribed. Participant is following exercise prescription and nutrition guidelines.;Long Term: Cholesterol controlled with medications as prescribed, with individualized exercise RX and with personalized nutrition plan. Value goals: LDL < 77mg,15m > 40 mg.       Core Components/Risk Factors/Patient Goals Review:  Goals and Risk Factor Review    Row Name 06/22/17 1659             Core Components/Risk Factors/Patient Goals Review   Personal Goals Review  Weight Management/Obesity;Hypertension;Heart Failure       Review  BarbarDeolindaeen decreasing portion sizes and has lost 5 lb.  Her weight is consistently down  Her goal weight is 139 lb.  Her BP has been good during [facility].         Expected Outcomes  Short - BarbarSharracontinue to exercise and watch portion sizes.  Long -  She will reach her goal weight of 139.          Core Components/Risk Factors/Patient Goals at Discharge (Final Review):  Goals and Risk Factor Review - 06/22/17 1659      Core Components/Risk Factors/Patient Goals Review   Personal Goals Review  Weight Management/Obesity;Hypertension;Heart Failure  Review  Moriya has been decreasing portion sizes and has lost 5 lb.  Her weight is consistently down  Her goal weight is 139 lb.  Her BP has been good during [facility].      Expected Outcomes  Short - Nene will continue to exercise and watch portion sizes.  Long - She will reach her goal weight of 139.       ITP Comments: ITP Comments    Row Name 05/23/17 1331 06/08/17 0835 06/15/17 1832 06/23/17 1747 07/06/17 0559   ITP Comments  Medical review completed today. INitial ITP created and sent to medical director for cosign and changes as necessary.  Documentation of diagnosis can be found in [facility] 04/06/2017 visit  30 day review. Continue with ITP unless directed changes per Medical Director Review.   Serenitee states her back hurt on the treadmill so we stopped her and had her sit. Her rhythm strip changed and heart rate up to 92. I had her stop and sit down. Her blood pressure was 104/60. Plan is to not have her do the treadmill. Areatha did the basic calf stretches and arm stretches seated with the rest of the [facility] class.   Tesslyn had paced beats this evening. I have not noticed paced beats when she has had the heart monitor on for Cardiac Rehab. No c/o. Grace states she did not feel her pacemaker or anything different. January states to her knowledge her heart rate does not go low enough for her pacemaker to kick in.   30 day review. Continue with ITP unless directed changes per Medical Director review.       Comments:

## 2017-07-07 DIAGNOSIS — I5022 Chronic systolic (congestive) heart failure: Secondary | ICD-10-CM

## 2017-07-07 DIAGNOSIS — I110 Hypertensive heart disease with heart failure: Secondary | ICD-10-CM | POA: Diagnosis not present

## 2017-07-07 NOTE — Progress Notes (Signed)
Daily Session Note  Patient Details  Name: Jenny Cooper MRN: [id number] Date of Birth: [birth year] Referring Provider:     Cardiac Rehab from 05/23/2017 in [facility] Cardiac and Pulmonary Rehab  Referring Provider  Ida Rogue MD      Encounter Date: 07/07/2017  Check In: Session Check In - 07/07/17 1635      Check-In   Location  [facility]-Cardiac & Pulmonary Rehab    Staff Present  Justin Mend RCP,RRT,BSRT;Carroll Enterkin, RN, BSN    Supervising physician immediately available to respond to emergencies  See telemetry face sheet for immediately available ER MD    Medication changes reported      No    Fall or balance concerns reported     No    Warm-up and Cool-down  Performed on first and last piece of equipment    Resistance Training Performed  Yes    VAD Patient?  No      Pain Assessment   Currently in Pain?  No/denies          Social History   Tobacco Use  Smoking Status Former Smoker  . Packs/day: 2.00  . Years: 42.00  . Pack years: 84.00  . Types: Cigarettes  . Last attempt to quit: 08/30/2000  . Years since quitting: 16.8  Smokeless Tobacco Never Used  Tobacco Comment   quit smoking in 08/28/2000    Goals Met:  Independence with exercise equipment Exercise tolerated well No report of cardiac concerns or symptoms Strength training completed today  Goals Unmet:  Not Applicable  Comments: Pt able to follow exercise prescription today without complaint.  Will continue to monitor for progression.   Dr. Emily Filbert is Medical Director for [place] and [facility].

## 2017-07-08 ENCOUNTER — Other Ambulatory Visit
Admission: RE | Admit: 2017-07-08 | Discharge: 2017-07-08 | Disposition: A | Discharge status: Home / Self Care | Payer: Medicare Other | Source: Ambulatory Visit | Attending: Internal Medicine | Admitting: Internal Medicine

## 2017-07-08 ENCOUNTER — Ambulatory Visit
Admission: RE | Admit: 2017-07-08 | Discharge: 2017-07-08 | Disposition: A | Discharge status: Home / Self Care | Payer: Medicare Other | Source: Ambulatory Visit | Attending: Internal Medicine | Admitting: Internal Medicine

## 2017-07-08 DIAGNOSIS — C3401 Malignant neoplasm of right main bronchus: Secondary | ICD-10-CM | POA: Insufficient documentation

## 2017-07-08 DIAGNOSIS — I700 Atherosclerosis of aorta: Secondary | ICD-10-CM | POA: Diagnosis not present

## 2017-07-08 DIAGNOSIS — J90 Pleural effusion, not elsewhere classified: Secondary | ICD-10-CM | POA: Diagnosis not present

## 2017-07-08 LAB — COMPREHENSIVE METABOLIC PANEL
ALBUMIN: 3.6 g/dL (ref 3.5–5.0)
ALK PHOS: 257 U/L — AB (ref 38–126)
ALT: 26 U/L (ref 14–54)
ANION GAP: 9 (ref 5–15)
AST: 47 U/L — ABNORMAL HIGH (ref 15–41)
BUN: 19 mg/dL (ref 6–20)
CALCIUM: 9.1 mg/dL (ref 8.9–10.3)
CHLORIDE: 96 mmol/L — AB (ref 101–111)
CO2: 32 mmol/L (ref 22–32)
CREATININE: 1.37 mg/dL — AB (ref 0.44–1.00)
GFR calc Af Amer: 41 mL/min — ABNORMAL LOW (ref >60)
GFR calc non Af Amer: 36 mL/min — ABNORMAL LOW (ref >60)
GLUCOSE: 115 mg/dL — AB (ref 65–99)
Potassium: 3.6 mmol/L (ref 3.5–5.1)
SODIUM: 137 mmol/L (ref 135–145)
Total Bilirubin: 0.7 mg/dL (ref 0.3–1.2)
Total Protein: 7.9 g/dL (ref 6.5–8.1)

## 2017-07-08 LAB — CBC WITH DIFFERENTIAL/PLATELET
BASOS PCT: 1 %
Basophils Absolute: 0 10*3/uL (ref 0–0.1)
Eosinophils Absolute: 0.9 10*3/uL — ABNORMAL HIGH (ref 0–0.7)
Eosinophils Relative: 21 %
HEMATOCRIT: 38.6 % (ref 35.0–47.0)
HEMOGLOBIN: 12.9 g/dL (ref 12.0–16.0)
LYMPHS ABS: 0.7 10*3/uL — AB (ref 1.0–3.6)
Lymphocytes Relative: 16 %
MCH: 30.7 pg (ref 26.0–34.0)
MCHC: 33.5 g/dL (ref 32.0–36.0)
MCV: 91.8 fL (ref 80.0–100.0)
MONO ABS: 0.5 10*3/uL (ref 0.2–0.9)
MONOS PCT: 13 %
NEUTROS ABS: 2.1 10*3/uL (ref 1.4–6.5)
Neutrophils Relative %: 49 %
Platelets: 192 10*3/uL (ref 150–440)
RBC: 4.21 MIL/uL (ref 3.80–5.20)
RDW: 15.3 % — AB (ref 11.5–14.5)
WBC: 4.2 10*3/uL (ref 3.6–11.0)

## 2017-07-08 MED ORDER — IOPAMIDOL (ISOVUE-300) INJECTION 61%
75.0000 mL | Freq: Once | INTRAVENOUS | Status: AC | PRN
  Administered 2017-07-08: 60 mL via INTRAVENOUS

## 2017-07-11 ENCOUNTER — Encounter: Payer: Medicare Other | Admitting: *Deleted

## 2017-07-11 ENCOUNTER — Other Ambulatory Visit: Payer: Self-pay | Admitting: Internal Medicine

## 2017-07-11 DIAGNOSIS — I5022 Chronic systolic (congestive) heart failure: Secondary | ICD-10-CM

## 2017-07-11 DIAGNOSIS — I110 Hypertensive heart disease with heart failure: Secondary | ICD-10-CM | POA: Diagnosis not present

## 2017-07-11 DIAGNOSIS — C3401 Malignant neoplasm of right main bronchus: Secondary | ICD-10-CM

## 2017-07-11 NOTE — Progress Notes (Signed)
Daily Session Note  Patient Details  Name: Jenny Cooper MRN: [id number] Date of Birth: [birth year] Referring Provider:     Cardiac Rehab from 05/23/2017 in [facility] Cardiac and Pulmonary Rehab  Referring Provider  Ida Rogue MD      Encounter Date: 07/11/2017  Check In: Session Check In - 07/11/17 1705      Check-In   Location  [facility]-Cardiac & Pulmonary Rehab    Staff Present  Nada Maclachlan, BA, ACSM CEP, Exercise Physiologist;Jhostin Epps Amedeo Plenty, BS, ACSM CEP, Exercise Physiologist;Meredith Sherryll Burger, RN BSN    Supervising physician immediately available to respond to emergencies  See telemetry face sheet for immediately available ER MD    Medication changes reported      No    Fall or balance concerns reported     No    Warm-up and Cool-down  Performed on first and last piece of equipment    Resistance Training Performed  Yes    VAD Patient?  No      Pain Assessment   Currently in Pain?  No/denies    Multiple Pain Sites  No          Social History   Tobacco Use  Smoking Status Former Smoker  . Packs/day: 2.00  . Years: 42.00  . Pack years: 84.00  . Types: Cigarettes  . Last attempt to quit: 08/30/2000  . Years since quitting: 16.8  Smokeless Tobacco Never Used  Tobacco Comment   quit smoking in 08/28/2000    Goals Met:  Independence with exercise equipment Exercise tolerated well No report of cardiac concerns or symptoms Strength training completed today  Goals Unmet:  Not Applicable  Comments: Pt able to follow exercise prescription today without complaint.  Will continue to monitor for progression.    Dr. Emily Filbert is Medical Director for [place] and [facility].

## 2017-07-12 ENCOUNTER — Other Ambulatory Visit: Payer: Self-pay

## 2017-07-12 ENCOUNTER — Other Ambulatory Visit: Payer: Medicare Other

## 2017-07-12 ENCOUNTER — Inpatient Hospital Stay: Payer: Medicare Other | Attending: Internal Medicine | Admitting: Internal Medicine

## 2017-07-12 VITALS — BP 109/79 | HR 72 | Temp 97.8°F | Resp 22 | Ht 65.0 in | Wt 164.2 lb

## 2017-07-12 DIAGNOSIS — E039 Hypothyroidism, unspecified: Secondary | ICD-10-CM | POA: Diagnosis not present

## 2017-07-12 DIAGNOSIS — Z950 Presence of cardiac pacemaker: Secondary | ICD-10-CM | POA: Insufficient documentation

## 2017-07-12 DIAGNOSIS — C3401 Malignant neoplasm of right main bronchus: Secondary | ICD-10-CM | POA: Diagnosis present

## 2017-07-12 DIAGNOSIS — I5022 Chronic systolic (congestive) heart failure: Secondary | ICD-10-CM

## 2017-07-12 DIAGNOSIS — N183 Chronic kidney disease, stage 3 (moderate): Secondary | ICD-10-CM | POA: Insufficient documentation

## 2017-07-12 DIAGNOSIS — E785 Hyperlipidemia, unspecified: Secondary | ICD-10-CM | POA: Insufficient documentation

## 2017-07-12 DIAGNOSIS — I480 Paroxysmal atrial fibrillation: Secondary | ICD-10-CM

## 2017-07-12 DIAGNOSIS — Z923 Personal history of irradiation: Secondary | ICD-10-CM | POA: Insufficient documentation

## 2017-07-12 DIAGNOSIS — Z800 Family history of malignant neoplasm of digestive organs: Secondary | ICD-10-CM | POA: Insufficient documentation

## 2017-07-12 DIAGNOSIS — I252 Old myocardial infarction: Secondary | ICD-10-CM | POA: Insufficient documentation

## 2017-07-12 DIAGNOSIS — I130 Hypertensive heart and chronic kidney disease with heart failure and stage 1 through stage 4 chronic kidney disease, or unspecified chronic kidney disease: Secondary | ICD-10-CM | POA: Diagnosis not present

## 2017-07-12 DIAGNOSIS — Z951 Presence of aortocoronary bypass graft: Secondary | ICD-10-CM | POA: Diagnosis not present

## 2017-07-12 DIAGNOSIS — J449 Chronic obstructive pulmonary disease, unspecified: Secondary | ICD-10-CM | POA: Diagnosis not present

## 2017-07-12 DIAGNOSIS — Z8701 Personal history of pneumonia (recurrent): Secondary | ICD-10-CM | POA: Diagnosis not present

## 2017-07-12 DIAGNOSIS — K219 Gastro-esophageal reflux disease without esophagitis: Secondary | ICD-10-CM | POA: Diagnosis not present

## 2017-07-12 DIAGNOSIS — Z9981 Dependence on supplemental oxygen: Secondary | ICD-10-CM | POA: Diagnosis not present

## 2017-07-12 DIAGNOSIS — Z9221 Personal history of antineoplastic chemotherapy: Secondary | ICD-10-CM | POA: Diagnosis not present

## 2017-07-12 DIAGNOSIS — Z7952 Long term (current) use of systemic steroids: Secondary | ICD-10-CM | POA: Diagnosis not present

## 2017-07-12 DIAGNOSIS — Z79899 Other long term (current) drug therapy: Secondary | ICD-10-CM | POA: Diagnosis not present

## 2017-07-12 DIAGNOSIS — Z87891 Personal history of nicotine dependence: Secondary | ICD-10-CM | POA: Insufficient documentation

## 2017-07-12 DIAGNOSIS — Z86718 Personal history of other venous thrombosis and embolism: Secondary | ICD-10-CM | POA: Insufficient documentation

## 2017-07-12 DIAGNOSIS — Z7901 Long term (current) use of anticoagulants: Secondary | ICD-10-CM | POA: Diagnosis not present

## 2017-07-12 DIAGNOSIS — I2510 Atherosclerotic heart disease of native coronary artery without angina pectoris: Secondary | ICD-10-CM | POA: Diagnosis not present

## 2017-07-12 DIAGNOSIS — J9610 Chronic respiratory failure, unspecified whether with hypoxia or hypercapnia: Secondary | ICD-10-CM | POA: Diagnosis not present

## 2017-07-12 NOTE — Progress Notes (Signed)
Pt presents to clinic today for follow-up for lung cancer and ct scan results.

## 2017-07-12 NOTE — Progress Notes (Signed)
[facility] OFFICE PROGRESS NOTE  Patient Care Team: Glean Hess, MD as PCP - General (Internal Medicine) Minna Merritts, MD as Consulting Physician (Cardiology) Vilinda Boehringer, MD (Inactive) as Consulting Physician (Pulmonary Disease) Cammie Sickle, MD as Consulting Physician (Internal Medicine) Alisa Graff, FNP as Nurse Practitioner (Family Medicine)  Cancer Staging No matching staging information was found for the patient.    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 Biopsy from hilar area and lymph node station 4R was positive for squamous cell carcinoma.  Patient had compression of the right mainstem bronchus because of enlarged lymph node and a mass; [ T4 N1 M0 tumor stage IIIa ].  2. Started on radiation and chemotherapy from October 21, 2014 3. Finished 6 cycles of carboplatinum and Taxol  in March  29 th of 2016,  PET scan shows significant response  4. Started on consolidation chemotherapy.  Patient finished 2 cycles on July 2016 of carboplatin and Taxol. 5. Atrial fibrillation diagnosis in August of 2016 on eloquis 6. Repeat bronchoscopy was negative for any malignancy  In February 2017.  # SEP 7th 2017- CT [facility]- 5cm hilar mass; bil Ground glass opacities.   # Radiation Pneumonitis [Dr.Mungal] on Prednisone  # May 1st 2018- F one- No targettable mutations; Int-TMB; ? 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  female pleasant patient above history of Stage III squamous cell lung cancer status post chemoradiation [finished treatment July 2016]; With a recurrent right-sided pleural effusion status post thoracentesis [may 2018]; without any convincing evidence of recurrence is here to review the results of her restaging CAT  scan.   Patient continues to complain of shortness of breath especially with exertion; she also states that she needs to keep her oxygen on 24 x 7.  Her oxygen levels dropped to high 80s when coming off oxygen.  Unfortunately continues to have chronic cough.  Denies any worsening cough or swelling in the legs.  No significant weight gain.  No hemoptysis.  Appetite is fair.   REVIEW OF SYSTEMS:  A complete 10 point review of system is done which is negative except mentioned above/history of present illness.   PAST MEDICAL HISTORY :  Past Medical History:  Diagnosis Date  . Atypical atrial flutter ([place])    a. s/p ablation 07/27/2013 followed by Dr. Rockey Situ  . CAD (coronary artery disease)    a. s/p MI x 2 in 2002 s/p PCI x 2 in 2002; b. s/p 2v CABG 2002; c. stress echo 07/2004 w/ evi of pos & inf infarct & no evi of ischemia; d. 4/08 dipyridamole scan w/ multiple areas of infarct, no ischemia, EF 49%; e. cath 04/28/15 3v CAD, med Rx rec, no targets for revasc, LM lum irregs, pLAD 30%, 100%, ost-pLCx 60%, [name] 99%, OM2 100%, p-mRCA 90%, [name] 100% L-R collats, VG-mLAD irregs, VG-OM2 oc  . Carcinoma of right lung ([place]) 01/03/2015   a. followed by Dr. Oliva Bustard  . Chronic systolic CHF (congestive heart failure) ([place])    a. echo 03/2015: EF 30-35%, sev ant/inf/pos HK, in mild to mod MR  . Complication of anesthesia    more recently patient oxygen levels do not rebound as quickly  .  Compressed spine fracture ([place]) 08/06/2016   lumbar 2, t11, t12  . COPD (chronic obstructive pulmonary disease) ([place])   . Fractured pelvis ([place]) 05/26/2016   2 places  . GERD (gastroesophageal reflux disease)   . History of blood clots    12/2001  . History of colonoscopy 2013  . History of mammography, screening 2015  . History of Papanicolaou smear of cervix 2013  . HLD (hyperlipidemia)   . HTN (hypertension)   . Hypothyroidism   . Lung cancer ([place])   . Mitral regurgitation    a. s/p mitral ring placement 09/2000;  b. echo 09/2010: EF 50%, inf HK, post AK, mild MR, prosthetic mitral valve ring w/ peak gradient of 10 mmHg; b. echo 2/13: EF 50%, mild MR/TR     . Myocardial infarction ([place])    X 2 (LAST ONE IN 2002)  . Neuropathy   . Pacemaker    a. MDT 2002; b. generator replacement 2013; c. followed by Dr. Omelia Blackwater, MD  . PAF (paroxysmal atrial fibrillation) ([place])    a. on Eliquis   . Personal history of chemotherapy   . Personal history of radiation therapy     PAST SURGICAL HISTORY :   Past Surgical History:  Procedure Laterality Date  . ABLATION  04/2016   [facility]  . APPENDECTOMY    . COLONOSCOPY  12/2009   2 small tubular adenomas  . CORONARY ARTERY BYPASS GRAFT  09/2000  . ECTOPIC PREGNANCY SURGERY    . PACEMAKER INSERTION  03/2012  . thorocentesis  12/24/2016  . VAGINAL HYSTERECTOMY     partial - left ovary remains    FAMILY HISTORY :   Family History  Problem Relation Age of Onset  . COPD Mother        sister, and brother  . Stroke Maternal Grandmother   . Hypertension Sister   . Hypertension Brother   . Colon cancer Brother        age 3  . Heart attack Neg Hx   . Breast cancer Neg Hx     SOCIAL HISTORY:   Social History   Tobacco Use  . Smoking status: Former Smoker    Packs/day: 2.00    Years: 42.00    Pack years: 84.00    Types: Cigarettes    Last attempt to quit: 08/30/2000    Years since quitting: 16.8  . Smokeless tobacco: Never Used  . Tobacco comment: quit smoking in 08/28/2000  Substance Use Topics  . Alcohol use: Yes    Alcohol/week: 6.0 oz    Types: 10 Glasses of wine per week    Comment: 2 glasses of wine per day  . Drug use: No    ALLERGIES:  is allergic to lovenox [enoxaparin sodium] and meperidine.  MEDICATIONS:  Current Outpatient Medications  Medication Sig Dispense Refill  . amiodarone (PACERONE) 200 MG tablet Take 200 mg by mouth daily.     Marland Kitchen apixaban (ELIQUIS) 5 MG TABS tablet Take 5 mg by mouth 2 (two) times daily.     Marland Kitchen atorvastatin  (LIPITOR) 20 MG tablet TAKE 1 TABLET (20 MG TOTAL) BY MOUTH AT BEDTIME. 30 tablet 5  . Calcium Carb-Cholecalciferol (CALCIUM 600-D PO) Take 1 tablet by mouth every morning.     . Cetirizine HCl 10 MG CAPS Take 1 capsule (10 mg total) by mouth 1 day or 1 dose. (Patient taking differently: Take 1 capsule by mouth daily. ) 30 capsule 5  . furosemide (LASIX) 40 MG tablet  Take 1 tablet (40 mg total) by mouth 2 (two) times daily. (Patient taking differently: Take 40 mg by mouth 2 (two) times daily. Except on Sunday.) 60 tablet 6  . levothyroxine (SYNTHROID, LEVOTHROID) 75 MCG tablet TAKE 1 TABLET (75 MCG TOTAL) BY MOUTH DAILY BEFORE BREAKFAST. 30 tablet 5  . metoprolol succinate (TOPROL-XL) 25 MG 24 hr tablet TAKE 1 TABLET BY MOUTH EVERY MORNING AND 2 TABS IN THE EVENING 90 tablet 3  . sacubitril-valsartan (ENTRESTO) 24-26 MG Take 1 tablet by mouth 2 (two) times daily. 60 tablet 6  . umeclidinium bromide (INCRUSE ELLIPTA) 62.5 MCG/INH AEPB Inhale 1 puff into the lungs daily. 1 each 0  . acetaminophen (TYLENOL) 500 MG tablet Take 1,000 mg by mouth every 6 (six) hours.     Marland Kitchen ipratropium-albuterol (DUONEB) 0.5-2.5 (3) MG/3ML SOLN Take 3 mLs by nebulization every 6 (six) hours as needed.    . senna (SENOKOT) 8.6 MG TABS tablet Take 1 tablet (8.6 mg total) by mouth daily. (Patient not taking: Reported on 07/12/2017) 30 each 0   No current facility-administered medications for this visit.    Facility-Administered Medications Ordered in Other Visits  Medication Dose Route Frequency Provider Last Rate Last Dose  . sodium chloride 0.9 % injection 10 mL  10 mL Intravenous PRN Forest Gleason, MD   10 mL at 02/19/15 1000  . sodium chloride flush (NS) 0.9 % injection 10 mL  10 mL Intravenous PRN Cammie Sickle, MD   10 mL at 02/16/16 1048    PHYSICAL EXAMINATION: ECOG PERFORMANCE STATUS: 0 - Asymptomatic  BP 109/79 (Patient Position: Sitting)   Pulse 72   Temp 97.8 F (36.6 C) (Tympanic)   Resp (!) 22    Ht _0  (1.651 m)   Wt 164 lb 3.9 oz (74.5 kg)   BMI 27.33 kg/m   Filed Weights   07/12/17 1137  Weight: 164 lb 3.9 oz (74.5 kg)    GENERAL: Well-nourished well-developed; Alert, no distress and comfortable. She is accompanied by her son.  She is wearing nasal cannula oxygen. EYES: no pallor or icterus OROPHARYNX: no thrush. no ulceration; good dentition  NECK: supple, no masses felt LYMPH:  no palpable lymphadenopathy in the cervical, axillary or inguinal regions LUNGS: Decreased breath sounds to auscultation right more than left.  No wheeze or crackles HEART/CVS: regular rate & rhythm and no murmurs; No lower extremity edema ABDOMEN:abdomen soft, non-tender and normal bowel sounds Musculoskeletal:no cyanosis of digits and no clubbing  PSYCH: alert & oriented x 3 with fluent speech NEURO: no focal motor/sensory deficits SKIN:  no rashes or significant lesions  LABORATORY DATA:  I have reviewed the data as listed    Component Value Date/Time   NA 137 07/08/2017 1114   NA 140 04/22/2017 1101   NA 136 12/24/2014 1457   K 3.6 07/08/2017 1114   K 3.6 12/24/2014 1457   CL 96 (L) 07/08/2017 1114   CL 98 (L) 12/24/2014 1457   CO2 32 07/08/2017 1114   CO2 32 12/24/2014 1457   GLUCOSE 115 (H) 07/08/2017 1114   GLUCOSE 107 (H) 12/24/2014 1457   BUN 19 07/08/2017 1114   BUN 21 04/22/2017 1101   BUN 17 12/24/2014 1457   CREATININE 1.37 (H) 07/08/2017 1114   CREATININE 1.00 12/24/2014 1457   CALCIUM 9.1 07/08/2017 1114   CALCIUM 9.5 12/24/2014 1457   PROT 7.9 07/08/2017 1114   PROT 7.4 12/24/2014 1457   ALBUMIN 3.6 07/08/2017 1114   ALBUMIN  3.9 12/24/2014 1457   AST 47 (H) 07/08/2017 1114   AST 22 12/24/2014 1457   ALT 26 07/08/2017 1114   ALT 19 12/24/2014 1457   ALKPHOS 257 (H) 07/08/2017 1114   ALKPHOS 65 12/24/2014 1457   BILITOT 0.7 07/08/2017 1114   BILITOT 0.4 12/24/2014 1457   GFRNONAA 36 (L) 07/08/2017 1114   GFRNONAA 55 (L) 12/24/2014 1457   GFRAA 41 (L)  07/08/2017 1114   GFRAA >60 12/24/2014 1457    No results found for: SPEP, UPEP  Lab Results  Component Value Date   WBC 4.2 07/08/2017   NEUTROABS 2.1 07/08/2017   HGB 12.9 07/08/2017   HCT 38.6 07/08/2017   MCV 91.8 07/08/2017   PLT 192 07/08/2017      Chemistry      Component Value Date/Time   NA 137 07/08/2017 1114   NA 140 04/22/2017 1101   NA 136 12/24/2014 1457   K 3.6 07/08/2017 1114   K 3.6 12/24/2014 1457   CL 96 (L) 07/08/2017 1114   CL 98 (L) 12/24/2014 1457   CO2 32 07/08/2017 1114   CO2 32 12/24/2014 1457   BUN 19 07/08/2017 1114   BUN 21 04/22/2017 1101   BUN 17 12/24/2014 1457   CREATININE 1.37 (H) 07/08/2017 1114   CREATININE 1.00 12/24/2014 1457      Component Value Date/Time   CALCIUM 9.1 07/08/2017 1114   CALCIUM 9.5 12/24/2014 1457   ALKPHOS 257 (H) 07/08/2017 1114   ALKPHOS 65 12/24/2014 1457   AST 47 (H) 07/08/2017 1114   AST 22 12/24/2014 1457   ALT 26 07/08/2017 1114   ALT 19 12/24/2014 1457   BILITOT 0.7 07/08/2017 1114   BILITOT 0.4 12/24/2014 1457     IMPRESSION: 1. Stable. Post treatment changes in the right hilum and parahilar right lung. No new or progressive findings on today's study. 2. Small to moderate right pleural effusion. 3. Coronary artery and Aortic Atherosclerois (ICD10-170.0)   Electronically Signed   By: Misty Stanley M.D.   On: 07/08/2017 15:29  RADIOGRAPHIC STUDIES: I have personally reviewed the radiological images as listed and agreed with the findings in the report. No results found.   ASSESSMENT & PLAN:  Cancer of hilus of right lung ([place]) Stage III squamous cell lung cancer s/p chemo-RT- 2016. Status post bronchoscopy in February 2017-negative for malignancy;- CT Nov 2018- no clinical evidence of recurrence.CT- shows no significant concerns for recurrent malignancy; shows radiation changes; pleural effusion [moderate-C discussion below].   # Clinically no evidence of progression at this time; based  on the CT scan.  Will plan to continue monitoring with CT imaging every 4-6 months.  #Chronic respiratory failure-Home O2 2 L; multifactorial -COPD/CHF/mRight pleural effusion [may 2018]- status post thoracentesis with symptomatic improvement; most recent CT November 2018- mild to moderate effusion.  For now recommend continued treatment of CHF; would not recommend increasing Lasix.  Encouraged patient to continue cardiopulmonary rehab.  # CKD-III creatinine 1.37 stable. sec to diuretics. Monitor for now.   # Follow-up in 2 months; CXR;labs.  # I reviewed the blood work- with the patient in detail; also reviewed the imaging independently [as summarized above]; and with the patient in detail.   Cc; Dr.Kasa; Dr.Gollan.    No orders of the defined types were placed in this encounter.  All questions were answered. The patient knows to call the clinic with any problems, questions or concerns.      Cammie Sickle, MD 07/12/2017  1:03 PM

## 2017-07-12 NOTE — Assessment & Plan Note (Addendum)
Stage III squamous cell lung cancer s/p chemo-RT- 2016. Status post bronchoscopy in February 2017-negative for malignancy;- CT Nov 2018- no clinical evidence of recurrence.CT- shows no significant concerns for recurrent malignancy; shows radiation changes; pleural effusion [moderate-C discussion below].   # Clinically no evidence of progression at this time; based on the CT scan.  Will plan to continue monitoring with CT imaging every 4-6 months.  #Chronic respiratory failure-Home O2 2 L; multifactorial -COPD/CHF/mRight pleural effusion [may 2018]- status post thoracentesis with symptomatic improvement; most recent CT November 2018- mild to moderate effusion.  For now recommend continued treatment of CHF; would not recommend increasing Lasix.  Encouraged patient to continue cardiopulmonary rehab.  # CKD-III creatinine 1.37 stable. sec to diuretics. Monitor for now.   # Follow-up in 2 months; CXR;labs.  # I reviewed the blood work- with the patient in detail; also reviewed the imaging independently [as summarized above]; and with the patient in detail.   Cc; Dr.Kasa; Dr.Gollan.

## 2017-07-13 ENCOUNTER — Encounter: Payer: Medicare Other | Admitting: *Deleted

## 2017-07-13 DIAGNOSIS — I5022 Chronic systolic (congestive) heart failure: Secondary | ICD-10-CM

## 2017-07-13 DIAGNOSIS — I110 Hypertensive heart disease with heart failure: Secondary | ICD-10-CM | POA: Diagnosis not present

## 2017-07-13 NOTE — Progress Notes (Signed)
Daily Session Note  Patient Details  Name: Jenny Cooper MRN: [id number] Date of Birth: March 30, 1936 Referring Provider:     Cardiac Rehab from 05/23/2017 in [facility] Cardiac and Pulmonary Rehab  Referring Provider  Ida Rogue MD      Encounter Date: 07/13/2017  Check In: Session Check In - 07/13/17 1626      Check-In   Location  [facility]-Cardiac & Pulmonary Rehab    Staff Present  Renita Papa, RN Vickki Hearing, BA, ACSM CEP, Exercise Physiologist;Carroll Enterkin, RN, BSN    Supervising physician immediately available to respond to emergencies  See telemetry face sheet for immediately available ER MD    Medication changes reported      No    Fall or balance concerns reported     No    Warm-up and Cool-down  Performed on first and last piece of equipment    Resistance Training Performed  Yes    VAD Patient?  No      Pain Assessment   Currently in Pain?  No/denies        Exercise Prescription Changes - 07/13/17 1200      Response to Exercise   Blood Pressure (Admit)  122/64    Blood Pressure (Exercise)  124/66    Blood Pressure (Exit)  132/60    Heart Rate (Admit)  72 bpm    Heart Rate (Exercise)  76 bpm    Heart Rate (Exit)  63 bpm    Rating of Perceived Exertion (Exercise)  13    Comments  none    Duration  Continue with 45 min of aerobic exercise without signs/symptoms of physical distress.    Intensity  THRR unchanged      Progression   Progression  Continue to progress workloads to maintain intensity without signs/symptoms of physical distress.    Average METs  1.95      Resistance Training   Training Prescription  Yes    Weight  3 lb    Reps  10-15      Interval Training   Interval Training  No      Oxygen   Oxygen  Continuous    Liters  2      NuStep   Level  5    Minutes  15    METs  2.1      Arm Ergometer   Level  1    RPM  50    Minutes  15    METs  1.8      Home Exercise Plan   Plans to continue exercise at  [facility]  considering FF/ walking at home    Frequency  Add 1 additional day to program exercise sessions.    Initial Home Exercises Provided  06/29/17       Social History   Tobacco Use  Smoking Status Former Smoker  . Packs/day: 2.00  . Years: 42.00  . Pack years: 84.00  . Types: Cigarettes  . Last attempt to quit: 08/30/2000  . Years since quitting: 16.8  Smokeless Tobacco Never Used  Tobacco Comment   quit smoking in 08/28/2000    Goals Met:  Independence with exercise equipment Exercise tolerated well No report of cardiac concerns or symptoms Strength training completed today  Goals Unmet:  Not Applicable  Comments: Pt able to follow exercise prescription today without complaint.  Will continue to monitor for progression.    Dr. Emily Filbert is Medical Director for [facility] and [facility]  Pulmonary Rehabilitation.

## 2017-07-14 ENCOUNTER — Encounter: Payer: Medicare Other | Admitting: *Deleted

## 2017-07-14 DIAGNOSIS — I5022 Chronic systolic (congestive) heart failure: Secondary | ICD-10-CM

## 2017-07-14 DIAGNOSIS — I110 Hypertensive heart disease with heart failure: Secondary | ICD-10-CM | POA: Diagnosis not present

## 2017-07-14 NOTE — Progress Notes (Signed)
Daily Session Note  Patient Details  Name: IO DIEUJUSTE MRN: [id number] Date of Birth: [birth year] Referring Provider:     Cardiac Rehab from 05/23/2017 in [facility] Cardiac and Pulmonary Rehab  Referring Provider  Ida Rogue MD      Encounter Date: 07/14/2017  Check In: Session Check In - 07/14/17 1642      Check-In   Location  [facility]-Cardiac & Pulmonary Rehab    Staff Present  Earlean Shawl, BS, ACSM CEP, Exercise Physiologist;Carroll Enterkin, RN, BSN;Other    Supervising physician immediately available to respond to emergencies  See telemetry face sheet for immediately available ER MD    Medication changes reported      No    Fall or balance concerns reported     No    Warm-up and Cool-down  Performed on first and last piece of equipment    Resistance Training Performed  Yes    VAD Patient?  No      Pain Assessment   Currently in Pain?  No/denies    Multiple Pain Sites  No          Social History   Tobacco Use  Smoking Status Former Smoker  . Packs/day: 2.00  . Years: 42.00  . Pack years: 84.00  . Types: Cigarettes  . Last attempt to quit: 08/30/2000  . Years since quitting: 16.8  Smokeless Tobacco Never Used  Tobacco Comment   quit smoking in 08/28/2000    Goals Met:  Independence with exercise equipment Exercise tolerated well No report of cardiac concerns or symptoms Strength training completed today  Goals Unmet:  Not Applicable  Comments: Pt able to follow exercise prescription today without complaint.  Will continue to monitor for progression.    Dr. Emily Filbert is Medical Director for [facility] and [facility] Pulmonary Rehabilitation.

## 2017-07-18 DIAGNOSIS — I5022 Chronic systolic (congestive) heart failure: Secondary | ICD-10-CM

## 2017-07-18 DIAGNOSIS — I110 Hypertensive heart disease with heart failure: Secondary | ICD-10-CM | POA: Diagnosis not present

## 2017-07-18 NOTE — Progress Notes (Signed)
Daily Session Note  Patient Details  Name: Jenny Cooper MRN: [id number] Date of Birth: 1936/02/11 Referring Provider:     Cardiac Rehab from 05/23/2017 in [facility] Cardiac and Pulmonary Rehab  Referring Provider  Ida Rogue MD      Encounter Date: 07/18/2017  Check In: Session Check In - 07/18/17 1727      Check-In   Location  [facility]-Cardiac & Pulmonary Rehab    Staff Present  Renita Papa, RN Moises Blood, BS, ACSM CEP, Exercise Physiologist;Shermeka Rutt Oletta Darter, [place], ACSM CEP, Exercise Physiologist    Supervising physician immediately available to respond to emergencies  See telemetry face sheet for immediately available ER MD    Medication changes reported      No    Fall or balance concerns reported     No    Resistance Training Performed  Yes    VAD Patient?  No      Pain Assessment   Currently in Pain?  No/denies    Multiple Pain Sites  No          Social History   Tobacco Use  Smoking Status Former Smoker  . Packs/day: 2.00  . Years: 42.00  . Pack years: 84.00  . Types: Cigarettes  . Last attempt to quit: 08/30/2000  . Years since quitting: 16.8  Smokeless Tobacco Never Used  Tobacco Comment   quit smoking in 08/28/2000    Goals Met:  Independence with exercise equipment Exercise tolerated well No report of cardiac concerns or symptoms Strength training completed today  Goals Unmet:  Not Applicable  Comments: Pt able to follow exercise prescription today without complaint.  Will continue to monitor for progression.    Dr. Emily Filbert is Medical Director for [facility] and [facility] Pulmonary [facility].

## 2017-07-20 ENCOUNTER — Encounter: Payer: Medicare Other | Admitting: *Deleted

## 2017-07-20 DIAGNOSIS — I5022 Chronic systolic (congestive) heart failure: Secondary | ICD-10-CM

## 2017-07-20 DIAGNOSIS — I110 Hypertensive heart disease with heart failure: Secondary | ICD-10-CM | POA: Diagnosis not present

## 2017-07-20 NOTE — Progress Notes (Signed)
Daily Session Note  Patient Details  Name: BERLIN VIERECK MRN: [id number] Date of Birth: 06-Sep-1935 Referring Provider:     Cardiac Rehab from 05/23/2017 in [facility] Cardiac and Pulmonary Rehab  Referring Provider  Ida Rogue MD      Encounter Date: 07/20/2017  Check In: Session Check In - 07/20/17 1626      Check-In   Location  [facility]-Cardiac & Pulmonary Rehab    Staff Present  Renita Papa, RN Vickki Hearing, BA, ACSM CEP, Exercise Physiologist;Carroll Enterkin, RN, BSN    Supervising physician immediately available to respond to emergencies  See telemetry face sheet for immediately available ER MD    Medication changes reported      No    Fall or balance concerns reported     No    Warm-up and Cool-down  Performed on first and last piece of equipment    Resistance Training Performed  Yes    VAD Patient?  No      Pain Assessment   Currently in Pain?  No/denies          Social History   Tobacco Use  Smoking Status Former Smoker  . Packs/day: 2.00  . Years: 42.00  . Pack years: 84.00  . Types: Cigarettes  . Last attempt to quit: 08/30/2000  . Years since quitting: 16.8  Smokeless Tobacco Never Used  Tobacco Comment   quit smoking in 08/28/2000    Goals Met:  Proper associated with RPD/PD & O2 Sat Independence with exercise equipment Exercise tolerated well No report of cardiac concerns or symptoms Strength training completed today  Goals Unmet:  Not Applicable  Comments: Pt able to follow exercise prescription today without complaint.  Will continue to monitor for progression.    Dr. Emily Filbert is Medical Director for [place] and [facility] Pulmonary [facility].

## 2017-07-25 DIAGNOSIS — I5022 Chronic systolic (congestive) heart failure: Secondary | ICD-10-CM

## 2017-07-25 DIAGNOSIS — I110 Hypertensive heart disease with heart failure: Secondary | ICD-10-CM | POA: Diagnosis not present

## 2017-07-25 NOTE — Progress Notes (Signed)
Daily Session Note  Patient Details  Name: Jenny Cooper MRN: [id number] Date of Birth: 06-26-1936 Referring Provider:     Cardiac Rehab from 05/23/2017 in [facility] Cardiac and Pulmonary Rehab  Referring Provider  Ida Rogue MD      Encounter Date: 07/25/2017  Check In: Session Check In - 07/25/17 1637      Check-In   Location  [facility]-Cardiac & Pulmonary Rehab    Staff Present  Renita Papa, RN Moises Blood, BS, ACSM CEP, Exercise Physiologist;Avnoor Koury Oletta Darter, [place], ACSM CEP, Exercise Physiologist    Supervising physician immediately available to respond to emergencies  See telemetry face sheet for immediately available ER MD    Medication changes reported      No    Fall or balance concerns reported     No    Warm-up and Cool-down  Performed on first and last piece of equipment    Resistance Training Performed  Yes    VAD Patient?  No      Pain Assessment   Currently in Pain?  No/denies          Social History   Tobacco Use  Smoking Status Former Smoker  . Packs/day: 2.00  . Years: 42.00  . Pack years: 84.00  . Types: Cigarettes  . Last attempt to quit: 08/30/2000  . Years since quitting: 16.9  Smokeless Tobacco Never Used  Tobacco Comment   quit smoking in 08/28/2000    Goals Met:  Independence with exercise equipment Exercise tolerated well No report of cardiac concerns or symptoms Strength training completed today  Goals Unmet:  Not Applicable  Comments: Pt able to follow exercise prescription today without complaint.  Will continue to monitor for progression.    Dr. Emily Filbert is Medical Director for [place] and [facility] Pulmonary [facility].

## 2017-07-27 ENCOUNTER — Encounter: Payer: Medicare Other | Admitting: *Deleted

## 2017-07-27 VITALS — Ht 65.0 in | Wt 164.0 lb

## 2017-07-27 DIAGNOSIS — I110 Hypertensive heart disease with heart failure: Secondary | ICD-10-CM | POA: Diagnosis not present

## 2017-07-27 DIAGNOSIS — I5022 Chronic systolic (congestive) heart failure: Secondary | ICD-10-CM

## 2017-07-27 NOTE — Patient Instructions (Signed)
Discharge Instructions  Patient Details  Name: Jenny Cooper MRN: [id number] Date of Birth: Feb 01, 1936 Referring Provider:  Minna Merritts, MD   Number of Visits: 31   Reason for Discharge:  Patient reached a stable level of exercise. Patient independent in their exercise. Patient has met program and personal goals.  Smoking History:  Social History   Tobacco Use  Smoking Status Former Smoker  . Packs/day: 2.00  . Years: 42.00  . Pack years: 84.00  . Types: Cigarettes  . Last attempt to quit: 08/30/2000  . Years since quitting: 16.9  Smokeless Tobacco Never Used  Tobacco Comment   quit smoking in 08/28/2000    Diagnosis:  Heart failure, chronic systolic (HCC)  Initial Exercise Prescription: Initial Exercise Prescription - 05/23/17 1600      Date of Initial Exercise RX and Referring Provider   Date  05/23/17    Referring Provider  Ida Rogue MD      Oxygen   Oxygen  Continuous    Liters  2      Treadmill   MPH  1.5    Grade  0    Minutes  15    METs  2.15      NuStep   Level  1    SPM  80    Minutes  15    METs  1.5      Arm Ergometer   Level  1    Watts  12    RPM  50    Minutes  15    METs  1.5      Prescription Details   Frequency (times per week)  3    Duration  Progress to 45 minutes of aerobic exercise without signs/symptoms of physical distress      Intensity   THRR 40-80% of Max Heartrate  94-125    Ratings of Perceived Exertion  11-13    Perceived Dyspnea  0-4      Progression   Progression  Continue to progress workloads to maintain intensity without signs/symptoms of physical distress.      Resistance Training   Training Prescription  Yes    Weight  3 lbs    Reps  10-15       Discharge Exercise Prescription (Final Exercise Prescription Changes): Exercise Prescription Changes - 07/13/17 1200      Response to Exercise   Blood Pressure (Admit)  122/64    Blood Pressure (Exercise)  124/66    Blood Pressure (Exit)   132/60    Heart Rate (Admit)  72 bpm    Heart Rate (Exercise)  76 bpm    Heart Rate (Exit)  63 bpm    Rating of Perceived Exertion (Exercise)  13    Comments  none    Duration  Continue with 45 min of aerobic exercise without signs/symptoms of physical distress.    Intensity  THRR unchanged      Progression   Progression  Continue to progress workloads to maintain intensity without signs/symptoms of physical distress.    Average METs  1.95      Resistance Training   Training Prescription  Yes    Weight  3 lb    Reps  10-15      Interval Training   Interval Training  No      Oxygen   Oxygen  Continuous    Liters  2      NuStep   Level  5    Minutes  15    METs  2.1      Arm Ergometer   Level  1    RPM  50    Minutes  15    METs  1.8      Home Exercise Plan   Plans to continue exercise at  [facility] considering FF/ walking at home    Frequency  Add 1 additional day to program exercise sessions.    Initial Home Exercises Provided  06/29/17       Functional Capacity: 6 Minute Walk    Row Name 05/23/17 1622         6 Minute Walk   Phase  Initial     Distance  667 feet     Walk Time  4.9 minutes     # of Rest Breaks  2 21 sec, 45 sec     MPH  1.54     METS  1.19     RPE  17     Perceived Dyspnea   2     VO2 Peak  4.18     Symptoms  Yes (comment)     Comments  back pain 7-8/10 (chronic), pulling e-cylinder as she left her tank in the car     Resting HR  62 bpm     Resting BP  124/74     Resting Oxygen Saturation   94 %     Exercise Oxygen Saturation  during 6 min walk  91 %     Max Ex. HR  91 bpm     Max Ex. BP  132/70     2 Minute Post BP  120/60       Interval HR   1 Minute HR  93     2 Minute HR  91     3 Minute HR  89     4 Minute HR  88     5 Minute HR  97     6 Minute HR  78     2 Minute Post HR  87     Interval Heart Rate?  Yes       Interval Oxygen   Interval Oxygen?  Yes     Baseline Oxygen Saturation %  94 %     1 Minute  Oxygen Saturation %  91 %     1 Minute Liters of Oxygen  2 L     2 Minute Oxygen Saturation %  91 %     2 Minute Liters of Oxygen  2 L     3 Minute Oxygen Saturation %  95 %     3 Minute Liters of Oxygen  2 L     4 Minute Oxygen Saturation %  95 %     4 Minute Liters of Oxygen  2 L     5 Minute Oxygen Saturation %  95 %     5 Minute Liters of Oxygen  2 L     6 Minute Oxygen Saturation %  95 %     6 Minute Liters of Oxygen  2 L     2 Minute Post Oxygen Saturation %  91 %     2 Minute Post Liters of Oxygen  2 L        Quality of Life: Quality of Life - 05/23/17 1345      Quality of Life Scores   Health/Function Pre  14.79 %    Socioeconomic Pre  12.67 %  Psych/Spiritual Pre  10.93 %    Family Pre  30 %    GLOBAL Pre  15.52 %       Personal Goals: Goals established at orientation with interventions provided to work toward goal. Personal Goals and Risk Factors at Admission - 05/23/17 1348      Core Components/Risk Factors/Patient Goals on Admission    Weight Management  Yes;Weight Loss    Intervention  Weight Management: Develop a combined nutrition and exercise program designed to reach desired caloric intake, while maintaining appropriate intake of nutrient and fiber, sodium and fats, and appropriate energy expenditure required for the weight goal.;Weight Management: Provide education and appropriate resources to help participant work on and attain dietary goals.;Weight Management/Obesity: Establish reasonable short term and long term weight goals.    Admit Weight  164 lb 4.8 oz (74.5 kg)    Goal Weight: Short Term  132 lb (59.9 kg)    Goal Weight: Long Term  148 lb (67.1 kg)    Expected Outcomes  Short Term: Continue to assess and modify interventions until short term weight is achieved;Long Term: Adherence to nutrition and physical activity/exercise program aimed toward attainment of established weight goal;Weight Loss: Understanding of general recommendations for a balanced  deficit meal plan, which promotes 1-2 lb weight loss per week and includes a negative energy balance of [phone] kcal/d;Understanding recommendations for meals to include 15-35% energy as protein, 25-35% energy from fat, 35-60% energy from carbohydrates, less than 221m of dietary cholesterol, 20-35 gm of total fiber daily;Understanding of distribution of calorie intake throughout the day with the consumption of 4-5 meals/snacks    Heart Failure  Yes    Intervention  Provide a combined exercise and nutrition program that is supplemented with education, support and counseling about heart failure. Directed toward relieving symptoms such as shortness of breath, decreased exercise tolerance, and extremity edema.    Expected Outcomes  Improve functional capacity of life;Short term: Attendance in program 2-3 days a week with increased exercise capacity. Reported lower sodium intake. Reported increased fruit and vegetable intake. Reports medication compliance.;Short term: Daily weights obtained and reported for increase. Utilizing diuretic protocols set by physician.;Long term: Adoption of self-care skills and reduction of barriers for early signs and symptoms recognition and intervention leading to self-care maintenance.    Hypertension  Yes    Intervention  Provide education on lifestyle modifcations including regular physical activity/exercise, weight management, moderate sodium restriction and increased consumption of fresh fruit, vegetables, and low fat dairy, alcohol moderation, and smoking cessation.;Monitor prescription use compliance.    Expected Outcomes  Short Term: Continued assessment and intervention until BP is < 140/925mHG in hypertensive participants. < 130/8032mG in hypertensive participants with diabetes, heart failure or chronic kidney disease.;Long Term: Maintenance of blood pressure at goal levels.    Lipids  Yes    Intervention  Provide education and support for participant on nutrition &  aerobic/resistive exercise along with prescribed medications to achieve LDL <61m27mDL >40mg27m Expected Outcomes  Short Term: Participant states understanding of desired cholesterol values and is compliant with medications prescribed. Participant is following exercise prescription and nutrition guidelines.;Long Term: Cholesterol controlled with medications as prescribed, with individualized exercise RX and with personalized nutrition plan. Value goals: LDL < 61mg,29m > 40 mg.        Personal Goals Discharge: Goals and Risk Factor Review - 07/14/17 1648      Core Components/Risk Factors/Patient Goals Review   Personal Goals Review  Weight Management/Obesity;Heart Failure;Hypertension;Improve shortness of breath with ADL's;Lipids    Review  Adaliah's weight has been steady and her blood pressure and cholesterol have been reported to be in acceptable ranges. She is taking her meds at directed. She has recently had a check up with her doctor and had a good report.     Expected Outcomes  Short: Sarajane will continue to manage her heart failure and maintain weight. Long: Kareen will continue to take meds, exercise and adopt a healthy life style to improve quality of life and independence.        Exercise Goals and Review: Exercise Goals    Row Name 05/23/17 1628             Exercise Goals   Increase Physical Activity  Yes       Intervention  Provide advice, education, support and counseling about physical activity/exercise needs.;Develop an individualized exercise prescription for aerobic and resistive training based on initial evaluation findings, risk stratification, comorbidities and participant's personal goals.       Expected Outcomes  Achievement of increased cardiorespiratory fitness and enhanced flexibility, muscular endurance and strength shown through measurements of functional capacity and personal statement of participant.       Increase Strength and Stamina  Yes        Intervention  Provide advice, education, support and counseling about physical activity/exercise needs.;Develop an individualized exercise prescription for aerobic and resistive training based on initial evaluation findings, risk stratification, comorbidities and participant's personal goals.       Expected Outcomes  Achievement of increased cardiorespiratory fitness and enhanced flexibility, muscular endurance and strength shown through measurements of functional capacity and personal statement of participant.       Able to understand and use rate of perceived exertion (RPE) scale  Yes       Intervention  Provide education and explanation on how to use RPE scale       Expected Outcomes  Short Term: Able to use RPE daily in rehab to express subjective intensity level;Long Term:  Able to use RPE to guide intensity level when exercising independently       Able to understand and use Dyspnea scale  Yes       Intervention  Provide education and explanation on how to use Dyspnea scale       Expected Outcomes  Short Term: Able to use Dyspnea scale daily in rehab to express subjective sense of shortness of breath during exertion;Long Term: Able to use Dyspnea scale to guide intensity level when exercising independently       Knowledge and understanding of Target Heart Rate Range (THRR)  Yes       Intervention  Provide education and explanation of THRR including how the numbers were predicted and where they are located for reference       Expected Outcomes  Short Term: Able to state/look up THRR;Long Term: Able to use THRR to govern intensity when exercising independently;Short Term: Able to use daily as guideline for intensity in rehab       Able to check pulse independently  Yes       Intervention  Provide education and demonstration on how to check pulse in carotid and radial arteries.;Review the importance of being able to check your own pulse for safety during independent exercise       Expected Outcomes   Short Term: Able to explain why pulse checking is important during independent exercise;Long Term: Able to check pulse independently and  accurately       Understanding of Exercise Prescription  Yes       Intervention  Provide education, explanation, and written materials on patient's individual exercise prescription       Expected Outcomes  Short Term: Able to explain program exercise prescription;Long Term: Able to explain home exercise prescription to exercise independently          Nutrition & Weight - Outcomes: Pre Biometrics - 05/23/17 1628      Pre Biometrics   Height  _0  (1.651 m)    Weight  164 lb 4.8 oz (74.5 kg)    Waist Circumference  39 inches    Hip Circumference  38 inches    Waist to Hip Ratio  1.03 %    BMI (Calculated)  27.34    Single Leg Stand  1.02 seconds        Nutrition: Nutrition Therapy & Goals - 07/14/17 1704      Nutrition Therapy   RD appointment defered  Yes       Nutrition Discharge: Nutrition Assessments - 05/23/17 1340      MEDFICTS Scores   Pre Score  19       Education Questionnaire Score: Knowledge Questionnaire Score - 05/23/17 1347      Knowledge Questionnaire Score   Pre Score  24/28 reviewed correct responses with Pamala Hurry. she had no further questions and verbalized understanding of the correct responses       Goals reviewed with patient; copy given to patient.

## 2017-07-27 NOTE — Progress Notes (Signed)
Daily Session Note  Patient Details  Cooper: Jenny Cooper MRN: [id number] Date of Birth: 12-07-1935 Referring Provider:     Cardiac Rehab from 05/23/2017 in [facility] Cardiac and Pulmonary Rehab  Referring Provider  Ida Rogue MD      Encounter Date: 07/27/2017  Check In: Session Check In - 07/27/17 1651      Check-In   Location  [facility]-Cardiac & Pulmonary Rehab    Staff Present  Renita Papa, RN Vickki Hearing, BA, ACSM CEP, Exercise Physiologist;Carroll Enterkin, RN, BSN    Supervising physician immediately available to respond to emergencies  See telemetry face sheet for immediately available ER MD    Medication changes reported      No    Fall or balance concerns reported     No    Warm-up and Cool-down  Performed on first and last piece of equipment    Resistance Training Performed  Yes    VAD Patient?  No      Pain Assessment   Currently in Pain?  No/denies          Social History   Tobacco Use  Smoking Status Former Smoker  . Packs/day: 2.00  . Years: 42.00  . Pack years: 84.00  . Types: Cigarettes  . Last attempt to quit: 08/30/2000  . Years since quitting: 16.9  Smokeless Tobacco Never Used  Tobacco Comment   quit smoking in 08/28/2000    Goals Met:  Independence with exercise equipment Exercise tolerated well No report of cardiac concerns or symptoms Strength training completed today  Goals Unmet:  Not Applicable  Comments:  Jenny Cooper 05/23/17 1622 07/27/17 1748       6 Minute Walk   Phase  Initial  Discharge    Distance  667 feet  755 feet    Distance % Change  -  13 %    Distance Feet Change  -  88 ft    Walk Time  4.9 minutes  5.75 minutes    # of Rest Breaks  2 21 sec, 45 sec  1    MPH  1.54  1.5    METS  1.19  1.4    RPE  17  15    Perceived Dyspnea   2  3    VO2 Peak  4.18  4.97    Symptoms  Yes (comment)  Yes (comment)    Comments  back pain 7-8/10 (chronic), pulling e-cylinder as she left her tank in the  car  back pain 9/10 - pulling O2 canister    Resting HR  62 bpm  65 bpm    Resting BP  124/74  126/64    Resting Oxygen Saturation   94 %  -    Exercise Oxygen Saturation  during 6 min walk  91 %  97 %    Max Ex. HR  91 bpm  98 bpm    Max Ex. BP  132/70  132/68    2 Minute Post BP  120/60  -      Interval HR   1 Minute HR  93  -    2 Minute HR  91  -    3 Minute HR  89  -    4 Minute HR  88  -    5 Minute HR  97  -    6 Minute HR  78  -    2 Minute Post HR  87  -    Interval Heart Rate?  Yes  -      Interval Oxygen   Interval Oxygen?  Yes  -    Baseline Oxygen Saturation %  94 %  -    1 Minute Oxygen Saturation %  91 %  -    1 Minute Liters of Oxygen  2 L  -    2 Minute Oxygen Saturation %  91 %  -    2 Minute Liters of Oxygen  2 L  -    3 Minute Oxygen Saturation %  95 %  -    3 Minute Liters of Oxygen  2 L  -    4 Minute Oxygen Saturation %  95 %  -    4 Minute Liters of Oxygen  2 L  -    5 Minute Oxygen Saturation %  95 %  -    5 Minute Liters of Oxygen  2 L  -    6 Minute Oxygen Saturation %  95 %  -    6 Minute Liters of Oxygen  2 L  -    2 Minute Post Oxygen Saturation %  91 %  -    2 Minute Post Liters of Oxygen  2 L  -         Dr. Emily Filbert is Medical Director for [facility].

## 2017-07-27 NOTE — Progress Notes (Signed)
Daily Session Note  Patient Details  Name: Jenny Cooper MRN: [id number] Date of Birth: [birth year] Referring Provider:     Cardiac Rehab from 05/23/2017 in [facility] Cardiac and Pulmonary Rehab  Referring Provider  Ida Rogue MD      Encounter Date: 07/27/2017  Check In: Session Check In - 07/27/17 1651      Check-In   Location  [facility]-Cardiac & Pulmonary Rehab    Staff Present  Renita Papa, RN Vickki Hearing, BA, ACSM CEP, Exercise Physiologist;Carroll Enterkin, RN, BSN    Supervising physician immediately available to respond to emergencies  See telemetry face sheet for immediately available ER MD    Medication changes reported      No    Fall or balance concerns reported     No    Warm-up and Cool-down  Performed on first and last piece of equipment    Resistance Training Performed  Yes    VAD Patient?  No      Pain Assessment   Currently in Pain?  No/denies          Social History   Tobacco Use  Smoking Status Former Smoker  . Packs/day: 2.00  . Years: 42.00  . Pack years: 84.00  . Types: Cigarettes  . Last attempt to quit: 08/30/2000  . Years since quitting: 16.9  Smokeless Tobacco Never Used  Tobacco Comment   quit smoking in 08/28/2000    Goals Met:  Independence with exercise equipment Exercise tolerated well No report of cardiac concerns or symptoms Strength training completed today  Goals Unmet:  Not Applicable  Comments: Pt able to follow exercise prescription today without complaint.  Will continue to monitor for progression.    Dr. Emily Filbert is Medical Director for [place] [facility].

## 2017-07-28 ENCOUNTER — Encounter: Payer: Medicare Other | Admitting: *Deleted

## 2017-07-28 DIAGNOSIS — I5022 Chronic systolic (congestive) heart failure: Secondary | ICD-10-CM

## 2017-07-28 DIAGNOSIS — I110 Hypertensive heart disease with heart failure: Secondary | ICD-10-CM | POA: Diagnosis not present

## 2017-07-28 NOTE — Patient Instructions (Signed)
Discharge Instructions  Patient Details  Name: Jenny Cooper MRN: [id number] Date of Birth: 10/08/1935 Referring Provider:  Minna Merritts, MD   Number of Visits: 36/36  Reason for Discharge:  Patient reached a stable level of exercise. Patient independent in their exercise. Patient has met program and personal goals.  Smoking History:  Social History   Tobacco Use  Smoking Status Former Smoker  . Packs/day: 2.00  . Years: 42.00  . Pack years: 84.00  . Types: Cigarettes  . Last attempt to quit: 08/30/2000  . Years since quitting: 16.9  Smokeless Tobacco Never Used  Tobacco Comment   quit smoking in 08/28/2000    Diagnosis:  Heart failure, chronic systolic (HCC)  Initial Exercise Prescription: Initial Exercise Prescription - 05/23/17 1600      Date of Initial Exercise RX and Referring Provider   Date  05/23/17    Referring Provider  Ida Rogue MD      Oxygen   Oxygen  Continuous    Liters  2      Treadmill   MPH  1.5    Grade  0    Minutes  15    METs  2.15      NuStep   Level  1    SPM  80    Minutes  15    METs  1.5      Arm Ergometer   Level  1    Watts  12    RPM  50    Minutes  15    METs  1.5      Prescription Details   Frequency (times per week)  3    Duration  Progress to 45 minutes of aerobic exercise without signs/symptoms of physical distress      Intensity   THRR 40-80% of Max Heartrate  94-125    Ratings of Perceived Exertion  11-13    Perceived Dyspnea  0-4      Progression   Progression  Continue to progress workloads to maintain intensity without signs/symptoms of physical distress.      Resistance Training   Training Prescription  Yes    Weight  3 lbs    Reps  10-15       Discharge Exercise Prescription (Final Exercise Prescription Changes): Exercise Prescription Changes - 07/13/17 1200      Response to Exercise   Blood Pressure (Admit)  122/64    Blood Pressure (Exercise)  124/66    Blood Pressure (Exit)   132/60    Heart Rate (Admit)  72 bpm    Heart Rate (Exercise)  76 bpm    Heart Rate (Exit)  63 bpm    Rating of Perceived Exertion (Exercise)  13    Comments  none    Duration  Continue with 45 min of aerobic exercise without signs/symptoms of physical distress.    Intensity  THRR unchanged      Progression   Progression  Continue to progress workloads to maintain intensity without signs/symptoms of physical distress.    Average METs  1.95      Resistance Training   Training Prescription  Yes    Weight  3 lb    Reps  10-15      Interval Training   Interval Training  No      Oxygen   Oxygen  Continuous    Liters  2      NuStep   Level  5    Minutes  15    METs  2.1      Arm Ergometer   Level  1    RPM  50    Minutes  15    METs  1.8      Home Exercise Plan   Plans to continue exercise at  [facility] considering FF/ walking at home    Frequency  Add 1 additional day to program exercise sessions.    Initial Home Exercises Provided  06/29/17       Functional Capacity: 6 Minute Walk    Row Name 05/23/17 1622 07/27/17 1748       6 Minute Walk   Phase  Initial  Discharge    Distance  667 feet  755 feet    Distance % Change  -  13 %    Distance Feet Change  -  88 ft    Walk Time  4.9 minutes  5.75 minutes    # of Rest Breaks  2 21 sec, 45 sec  1    MPH  1.54  1.5    METS  1.19  1.4    RPE  17  15    Perceived Dyspnea   2  3    VO2 Peak  4.18  4.97    Symptoms  Yes (comment)  Yes (comment)    Comments  back pain 7-8/10 (chronic), pulling e-cylinder as she left her tank in the car  back pain 9/10 - pulling O2 canister    Resting HR  62 bpm  65 bpm    Resting BP  124/74  126/64    Resting Oxygen Saturation   94 %  -    Exercise Oxygen Saturation  during 6 min walk  91 %  97 %    Max Ex. HR  91 bpm  98 bpm    Max Ex. BP  132/70  132/68    2 Minute Post BP  120/60  -      Interval HR   1 Minute HR  93  -    2 Minute HR  91  -    3 Minute HR  89  -     4 Minute HR  88  -    5 Minute HR  97  -    6 Minute HR  78  -    2 Minute Post HR  87  -    Interval Heart Rate?  Yes  -      Interval Oxygen   Interval Oxygen?  Yes  -    Baseline Oxygen Saturation %  94 %  -    1 Minute Oxygen Saturation %  91 %  -    1 Minute Liters of Oxygen  2 L  -    2 Minute Oxygen Saturation %  91 %  -    2 Minute Liters of Oxygen  2 L  -    3 Minute Oxygen Saturation %  95 %  -    3 Minute Liters of Oxygen  2 L  -    4 Minute Oxygen Saturation %  95 %  -    4 Minute Liters of Oxygen  2 L  -    5 Minute Oxygen Saturation %  95 %  -    5 Minute Liters of Oxygen  2 L  -    6 Minute Oxygen Saturation %  95 %  -  6 Minute Liters of Oxygen  2 L  -    2 Minute Post Oxygen Saturation %  91 %  -    2 Minute Post Liters of Oxygen  2 L  -       Quality of Life: Quality of Life - 05/23/17 1345      Quality of Life Scores   Health/Function Pre  14.79 %    Socioeconomic Pre  12.67 %    Psych/Spiritual Pre  10.93 %    Family Pre  30 %    GLOBAL Pre  15.52 %       Personal Goals: Goals established at orientation with interventions provided to work toward goal. Personal Goals and Risk Factors at Admission - 05/23/17 1348      Core Components/Risk Factors/Patient Goals on Admission    Weight Management  Yes;Weight Loss    Intervention  Weight Management: Develop a combined nutrition and exercise program designed to reach desired caloric intake, while maintaining appropriate intake of nutrient and fiber, sodium and fats, and appropriate energy expenditure required for the weight goal.;Weight Management: Provide education and appropriate resources to help participant work on and attain dietary goals.;Weight Management/Obesity: Establish reasonable short term and long term weight goals.    Admit Weight  164 lb 4.8 oz (74.5 kg)    Goal Weight: Short Term  132 lb (59.9 kg)    Goal Weight: Long Term  148 lb (67.1 kg)    Expected Outcomes  Short Term: Continue  to assess and modify interventions until short term weight is achieved;Long Term: Adherence to nutrition and physical activity/exercise program aimed toward attainment of established weight goal;Weight Loss: Understanding of general recommendations for a balanced deficit meal plan, which promotes 1-2 lb weight loss per week and includes a negative energy balance of [id number] kcal/d;Understanding recommendations for meals to include 15-35% energy as protein, 25-35% energy from fat, 35-60% energy from carbohydrates, less than 26m of dietary cholesterol, 20-35 gm of total fiber daily;Understanding of distribution of calorie intake throughout the day with the consumption of 4-5 meals/snacks    Heart Failure  Yes    Intervention  Provide a combined exercise and nutrition program that is supplemented with education, support and counseling about heart failure. Directed toward relieving symptoms such as shortness of breath, decreased exercise tolerance, and extremity edema.    Expected Outcomes  Improve functional capacity of life;Short term: Attendance in program 2-3 days a week with increased exercise capacity. Reported lower sodium intake. Reported increased fruit and vegetable intake. Reports medication compliance.;Short term: Daily weights obtained and reported for increase. Utilizing diuretic protocols set by physician.;Long term: Adoption of self-care skills and reduction of barriers for early signs and symptoms recognition and intervention leading to self-care maintenance.    Hypertension  Yes    Intervention  Provide education on lifestyle modifcations including regular physical activity/exercise, weight management, moderate sodium restriction and increased consumption of fresh fruit, vegetables, and low fat dairy, alcohol moderation, and smoking cessation.;Monitor prescription use compliance.    Expected Outcomes  Short Term: Continued assessment and intervention until BP is < 140/935mHG in hypertensive  participants. < 130/8019mG in hypertensive participants with diabetes, heart failure or chronic kidney disease.;Long Term: Maintenance of blood pressure at goal levels.    Lipids  Yes    Intervention  Provide education and support for participant on nutrition & aerobic/resistive exercise along with prescribed medications to achieve LDL <59m84mDL >40mg77m Expected Outcomes  Short  Term: Participant states understanding of desired cholesterol values and is compliant with medications prescribed. Participant is following exercise prescription and nutrition guidelines.;Long Term: Cholesterol controlled with medications as prescribed, with individualized exercise RX and with personalized nutrition plan. Value goals: LDL < 16m, HDL > 40 mg.        Personal Goals Discharge: Goals and Risk Factor Review - 07/14/17 1648      Core Components/Risk Factors/Patient Goals Review   Personal Goals Review  Weight Management/Obesity;Heart Failure;Hypertension;Improve shortness of breath with ADL's;Lipids    Review  Edie's weight has been steady and her blood pressure and cholesterol have been reported to be in acceptable ranges. She is taking her meds at directed. She has recently had a check up with her doctor and had a good report.     Expected Outcomes  Short: BBeckeywill continue to manage her heart failure and maintain weight. Long: BCodiwill continue to take meds, exercise and adopt a healthy life style to improve quality of life and independence.        Exercise Goals and Review: Exercise Goals    Row Name 05/23/17 1628             Exercise Goals   Increase Physical Activity  Yes       Intervention  Provide advice, education, support and counseling about physical activity/exercise needs.;Develop an individualized exercise prescription for aerobic and resistive training based on initial evaluation findings, risk stratification, comorbidities and participant's personal goals.       Expected  Outcomes  Achievement of increased cardiorespiratory fitness and enhanced flexibility, muscular endurance and strength shown through measurements of functional capacity and personal statement of participant.       Increase Strength and Stamina  Yes       Intervention  Provide advice, education, support and counseling about physical activity/exercise needs.;Develop an individualized exercise prescription for aerobic and resistive training based on initial evaluation findings, risk stratification, comorbidities and participant's personal goals.       Expected Outcomes  Achievement of increased cardiorespiratory fitness and enhanced flexibility, muscular endurance and strength shown through measurements of functional capacity and personal statement of participant.       Able to understand and use rate of perceived exertion (RPE) scale  Yes       Intervention  Provide education and explanation on how to use RPE scale       Expected Outcomes  Short Term: Able to use RPE daily in rehab to express subjective intensity level;Long Term:  Able to use RPE to guide intensity level when exercising independently       Able to understand and use Dyspnea scale  Yes       Intervention  Provide education and explanation on how to use Dyspnea scale       Expected Outcomes  Short Term: Able to use Dyspnea scale daily in rehab to express subjective sense of shortness of breath during exertion;Long Term: Able to use Dyspnea scale to guide intensity level when exercising independently       Knowledge and understanding of Target Heart Rate Range (THRR)  Yes       Intervention  Provide education and explanation of THRR including how the numbers were predicted and where they are located for reference       Expected Outcomes  Short Term: Able to state/look up THRR;Long Term: Able to use THRR to govern intensity when exercising independently;Short Term: Able to use daily as guideline for intensity in  rehab       Able to check  pulse independently  Yes       Intervention  Provide education and demonstration on how to check pulse in carotid and radial arteries.;Review the importance of being able to check your own pulse for safety during independent exercise       Expected Outcomes  Short Term: Able to explain why pulse checking is important during independent exercise;Long Term: Able to check pulse independently and accurately       Understanding of Exercise Prescription  Yes       Intervention  Provide education, explanation, and written materials on patient's individual exercise prescription       Expected Outcomes  Short Term: Able to explain program exercise prescription;Long Term: Able to explain home exercise prescription to exercise independently          Nutrition & Weight - Outcomes: Pre Biometrics - 05/23/17 1628      Pre Biometrics   Height  5' 5"  (1.651 m)    Weight  164 lb 4.8 oz (74.5 kg)    Waist Circumference  39 inches    Hip Circumference  38 inches    Waist to Hip Ratio  1.03 %    BMI (Calculated)  27.34    Single Leg Stand  1.02 seconds      Post Biometrics - 07/27/17 1747       Post  Biometrics   Height  5' 5"  (1.651 m)    Weight  164 lb (74.4 kg)    Waist Circumference  38.5 inches    Hip Circumference  39.5 inches    Waist to Hip Ratio  0.97 %    BMI (Calculated)  27.29    Single Leg Stand  2.73 seconds       Nutrition: Nutrition Therapy & Goals - 07/14/17 1704      Nutrition Therapy   RD appointment defered  Yes       Nutrition Discharge: Nutrition Assessments - 05/23/17 1340      MEDFICTS Scores   Pre Score  19       Education Questionnaire Score: Knowledge Questionnaire Score - 05/23/17 1347      Knowledge Questionnaire Score   Pre Score  24/28 reviewed correct responses with Pamala Hurry. she had no further questions and verbalized understanding of the correct responses       Goals reviewed with patient; copy given to patient.

## 2017-07-28 NOTE — Progress Notes (Signed)
Discharge Progress Report  Patient Details  Name: DOMENIQUE QUEST MRN: [id number] Date of Birth: 19-Sep-1935 Referring Provider:     Cardiac Rehab from 05/23/2017 in [facility]  Referring Provider  Ida Rogue MD       Number of Visits: 36/36  Reason for Discharge:  Patient reached a stable level of exercise. Patient independent in their exercise. Patient has met program and personal goals.  Smoking History:  Social History   Tobacco Use  Smoking Status Former Smoker  . Packs/day: 2.00  . Years: 42.00  . Pack years: 84.00  . Types: Cigarettes  . Last attempt to quit: 08/30/2000  . Years since quitting: 16.9  Smokeless Tobacco Never Used  Tobacco Comment   quit smoking in 08/28/2000    Diagnosis:  Heart failure, chronic systolic (HCC)  ADL UCSD:   Initial Exercise Prescription: Initial Exercise Prescription - 05/23/17 1600      Date of Initial Exercise RX and Referring Provider   Date  05/23/17    Referring Provider  Ida Rogue MD      Oxygen   Oxygen  Continuous    Liters  2      Treadmill   MPH  1.5    Grade  0    Minutes  15    METs  2.15      NuStep   Level  1    SPM  80    Minutes  15    METs  1.5      Arm Ergometer   Level  1    Watts  12    RPM  50    Minutes  15    METs  1.5      Prescription Details   Frequency (times per week)  3    Duration  Progress to 45 minutes of aerobic exercise without signs/symptoms of physical distress      Intensity   THRR 40-80% of Max Heartrate  94-125    Ratings of Perceived Exertion  11-13    Perceived Dyspnea  0-4      Progression   Progression  Continue to progress workloads to maintain intensity without signs/symptoms of physical distress.      Resistance Training   Training Prescription  Yes    Weight  3 lbs    Reps  10-15       Discharge Exercise Prescription (Final Exercise Prescription Changes): Exercise Prescription Changes - 07/13/17 1200       Response to Exercise   Blood Pressure (Admit)  122/64    Blood Pressure (Exercise)  124/66    Blood Pressure (Exit)  132/60    Heart Rate (Admit)  72 bpm    Heart Rate (Exercise)  76 bpm    Heart Rate (Exit)  63 bpm    Rating of Perceived Exertion (Exercise)  13    Comments  none    Duration  Continue with 45 min of aerobic exercise without signs/symptoms of physical distress.    Intensity  THRR unchanged      Progression   Progression  Continue to progress workloads to maintain intensity without signs/symptoms of physical distress.    Average METs  1.95      Resistance Training   Training Prescription  Yes    Weight  3 lb    Reps  10-15      Interval Training   Interval Training  No      Oxygen  Oxygen  Continuous    Liters  2      NuStep   Level  5    Minutes  15    METs  2.1      Arm Ergometer   Level  1    RPM  50    Minutes  15    METs  1.8      Home Exercise Plan   Plans to continue exercise at  [facility] considering FF/ walking at home    Frequency  Add 1 additional day to program exercise sessions.    Initial Home Exercises Provided  06/29/17       Functional Capacity: 6 Minute Walk    Row Name 05/23/17 1622 07/27/17 1748       6 Minute Walk   Phase  Initial  Discharge    Distance  667 feet  755 feet    Distance % Change  -  13 %    Distance Feet Change  -  88 ft    Walk Time  4.9 minutes  5.75 minutes    # of Rest Breaks  2 21 sec, 45 sec  1    MPH  1.54  1.5    METS  1.19  1.4    RPE  17  15    Perceived Dyspnea   2  3    VO2 Peak  4.18  4.97    Symptoms  Yes (comment)  Yes (comment)    Comments  back pain 7-8/10 (chronic), pulling e-cylinder as she left her tank in the car  back pain 9/10 - pulling O2 canister    Resting HR  62 bpm  65 bpm    Resting BP  124/74  126/64    Resting Oxygen Saturation   94 %  -    Exercise Oxygen Saturation  during 6 min walk  91 %  97 %    Max Ex. HR  91 bpm  98 bpm    Max Ex. BP  132/70  132/68     2 Minute Post BP  120/60  -      Interval HR   1 Minute HR  93  -    2 Minute HR  91  -    3 Minute HR  89  -    4 Minute HR  88  -    5 Minute HR  97  -    6 Minute HR  78  -    2 Minute Post HR  87  -    Interval Heart Rate?  Yes  -      Interval Oxygen   Interval Oxygen?  Yes  -    Baseline Oxygen Saturation %  94 %  -    1 Minute Oxygen Saturation %  91 %  -    1 Minute Liters of Oxygen  2 L  -    2 Minute Oxygen Saturation %  91 %  -    2 Minute Liters of Oxygen  2 L  -    3 Minute Oxygen Saturation %  95 %  -    3 Minute Liters of Oxygen  2 L  -    4 Minute Oxygen Saturation %  95 %  -    4 Minute Liters of Oxygen  2 L  -    5 Minute Oxygen Saturation %  95 %  -    5  Minute Liters of Oxygen  2 L  -    6 Minute Oxygen Saturation %  95 %  -    6 Minute Liters of Oxygen  2 L  -    2 Minute Post Oxygen Saturation %  91 %  -    2 Minute Post Liters of Oxygen  2 L  -       Psychological, QOL, Others - Outcomes: PHQ 2/9: Depression screen [facility] 2/9 05/23/2017 05/18/2017 04/14/2017 03/07/2017 08/02/2016  Decreased Interest 1 0 0 0 0  Down, Depressed, Hopeless 1 3 0 0 0  PHQ - 2 Score 2 3 0 0 0  Altered sleeping 1 - - - -  Tired, decreased energy 1 - - - -  Change in appetite 1 - - - -  Feeling bad or failure about yourself  0 - - - -  Trouble concentrating 0 - - - -  Moving slowly or fidgety/restless 0 - - - -  Suicidal thoughts 0 - - - -  PHQ-9 Score 5 - - - -  Difficult doing work/chores Somewhat difficult - - - -  Some recent data might be hidden    Quality of Life: Quality of Life - 05/23/17 1345      Quality of Life Scores   Health/Function Pre  14.79 %    Socioeconomic Pre  12.67 %    Psych/Spiritual Pre  10.93 %    Family Pre  30 %    GLOBAL Pre  15.52 %       Personal Goals: Goals established at orientation with interventions provided to work toward goal. Personal Goals and Risk Factors at Admission - 05/23/17 1348      Core Components/Risk  Factors/Patient Goals on Admission    Weight Management  Yes;Weight Loss    Intervention  Weight Management: Develop a combined nutrition and exercise program designed to reach desired caloric intake, while maintaining appropriate intake of nutrient and fiber, sodium and fats, and appropriate energy expenditure required for the weight goal.;Weight Management: Provide education and appropriate resources to help participant work on and attain dietary goals.;Weight Management/Obesity: Establish reasonable short term and long term weight goals.    Admit Weight  164 lb 4.8 oz (74.5 kg)    Goal Weight: Short Term  132 lb (59.9 kg)    Goal Weight: Long Term  148 lb (67.1 kg)    Expected Outcomes  Short Term: Continue to assess and modify interventions until short term weight is achieved;Long Term: Adherence to nutrition and physical activity/exercise program aimed toward attainment of established weight goal;Weight Loss: Understanding of general recommendations for a balanced deficit meal plan, which promotes 1-2 lb weight loss per week and includes a negative energy balance of [id number] kcal/d;Understanding recommendations for meals to include 15-35% energy as protein, 25-35% energy from fat, 35-60% energy from carbohydrates, less than 255m of dietary cholesterol, 20-35 gm of total fiber daily;Understanding of distribution of calorie intake throughout the day with the consumption of 4-5 meals/snacks    Heart Failure  Yes    Intervention  Provide a combined exercise and nutrition program that is supplemented with education, support and counseling about heart failure. Directed toward relieving symptoms such as shortness of breath, decreased exercise tolerance, and extremity edema.    Expected Outcomes  Improve functional capacity of life;Short term: Attendance in program 2-3 days a week with increased exercise capacity. Reported lower sodium intake. Reported increased fruit and vegetable intake. Reports medication  compliance.;Short  term: Daily weights obtained and reported for increase. Utilizing diuretic protocols set by physician.;Long term: Adoption of self-care skills and reduction of barriers for early signs and symptoms recognition and intervention leading to self-care maintenance.    Hypertension  Yes    Intervention  Provide education on lifestyle modifcations including regular physical activity/exercise, weight management, moderate sodium restriction and increased consumption of fresh fruit, vegetables, and low fat dairy, alcohol moderation, and smoking cessation.;Monitor prescription use compliance.    Expected Outcomes  Short Term: Continued assessment and intervention until BP is < 140/48m HG in hypertensive participants. < 130/83mHG in hypertensive participants with diabetes, heart failure or chronic kidney disease.;Long Term: Maintenance of blood pressure at goal levels.    Lipids  Yes    Intervention  Provide education and support for participant on nutrition & aerobic/resistive exercise along with prescribed medications to achieve LDL <7077mHDL >16m69m  Expected Outcomes  Short Term: Participant states understanding of desired cholesterol values and is compliant with medications prescribed. Participant is following exercise prescription and nutrition guidelines.;Long Term: Cholesterol controlled with medications as prescribed, with individualized exercise RX and with personalized nutrition plan. Value goals: LDL < 70mg39mL > 40 mg.        Personal Goals Discharge: Goals and Risk Factor Review    Row Name 06/22/17 1659 07/13/17 1727 07/14/17 1648         Core Components/Risk Factors/Patient Goals Review   Personal Goals Review  Weight Management/Obesity;Hypertension;Heart Failure  Weight Management/Obesity;Develop more efficient breathing techniques such as purse lipped breathing and diaphragmatic breathing and practicing self-pacing with activity.;Heart Failure;Hypertension;Lipids   Weight Management/Obesity;Heart Failure;Hypertension;Improve shortness of breath with ADL's;Lipids     Review  BarbaRyleybeen decreasing portion sizes and has lost 5 lb.  Her weight is consistently down  Her goal weight is 139 lb.  Her BP has been good during [facility].    Taelar's weight has stayed about the same.  Her goal is to maintain throught the first of the year then work on losing some weight.  She is taking meds as directed.  She is concerned about her legs  hurting at night after she exercises.  She uses heated rice bags to help.  She will also try compression socks that she has during exrecise and during the day.   SHe will also ask her Dr if lipitor coudl be the problem.     Jayliah's weight has been steady and her blood pressure and cholesterol have been reported to be in acceptable ranges. She is taking her meds at directed. She has recently had a check up with her doctor and had a good report.      Expected Outcomes  Short - BarbaKatelee continue to exercise and watch portion sizes.  Long - She will reach her goal weight of 139.  Short - BarbaRidhima try the compression socks during exercise.  Long - BarbaTheresea be able to exercise without excessive leg fatugue.  Short: BarbaEmmali continue to manage her heart failure and maintain weight. Long: BarbaShaliah continue to take meds, exercise and adopt a healthy life style to improve quality of life and independence.         Exercise Goals and Review: Exercise Goals    Row Name 05/23/17 1628             Exercise Goals   Increase Physical Activity  Yes       Intervention  Provide advice, education, support and  counseling about physical activity/exercise needs.;Develop an individualized exercise prescription for aerobic and resistive training based on initial evaluation findings, risk stratification, comorbidities and participant's personal goals.       Expected Outcomes  Achievement of increased cardiorespiratory fitness and  enhanced flexibility, muscular endurance and strength shown through measurements of functional capacity and personal statement of participant.       Increase Strength and Stamina  Yes       Intervention  Provide advice, education, support and counseling about physical activity/exercise needs.;Develop an individualized exercise prescription for aerobic and resistive training based on initial evaluation findings, risk stratification, comorbidities and participant's personal goals.       Expected Outcomes  Achievement of increased cardiorespiratory fitness and enhanced flexibility, muscular endurance and strength shown through measurements of functional capacity and personal statement of participant.       Able to understand and use rate of perceived exertion (RPE) scale  Yes       Intervention  Provide education and explanation on how to use RPE scale       Expected Outcomes  Short Term: Able to use RPE daily in rehab to express subjective intensity level;Long Term:  Able to use RPE to guide intensity level when exercising independently       Able to understand and use Dyspnea scale  Yes       Intervention  Provide education and explanation on how to use Dyspnea scale       Expected Outcomes  Short Term: Able to use Dyspnea scale daily in rehab to express subjective sense of shortness of breath during exertion;Long Term: Able to use Dyspnea scale to guide intensity level when exercising independently       Knowledge and understanding of Target Heart Rate Range (THRR)  Yes       Intervention  Provide education and explanation of THRR including how the numbers were predicted and where they are located for reference       Expected Outcomes  Short Term: Able to state/look up THRR;Long Term: Able to use THRR to govern intensity when exercising independently;Short Term: Able to use daily as guideline for intensity in rehab       Able to check pulse independently  Yes       Intervention  Provide education and  demonstration on how to check pulse in carotid and radial arteries.;Review the importance of being able to check your own pulse for safety during independent exercise       Expected Outcomes  Short Term: Able to explain why pulse checking is important during independent exercise;Long Term: Able to check pulse independently and accurately       Understanding of Exercise Prescription  Yes       Intervention  Provide education, explanation, and written materials on patient's individual exercise prescription       Expected Outcomes  Short Term: Able to explain program exercise prescription;Long Term: Able to explain home exercise prescription to exercise independently          Nutrition & Weight - Outcomes: Pre Biometrics - 05/23/17 1628      Pre Biometrics   Height  5' 5"  (1.651 m)    Weight  164 lb 4.8 oz (74.5 kg)    Waist Circumference  39 inches    Hip Circumference  38 inches    Waist to Hip Ratio  1.03 %    BMI (Calculated)  27.34    Single Leg Stand  1.02 seconds  Post Biometrics - 07/27/17 1747       Post  Biometrics   Height  5' 5"  (1.651 m)    Weight  164 lb (74.4 kg)    Waist Circumference  38.5 inches    Hip Circumference  39.5 inches    Waist to Hip Ratio  0.97 %    BMI (Calculated)  27.29    Single Leg Stand  2.73 seconds       Nutrition: Nutrition Therapy & Goals - 07/14/17 1704      Nutrition Therapy   RD appointment defered  Yes       Nutrition Discharge: Nutrition Assessments - 05/23/17 1340      MEDFICTS Scores   Pre Score  19       Education Questionnaire Score: Knowledge Questionnaire Score - 05/23/17 1347      Knowledge Questionnaire Score   Pre Score  24/28 reviewed correct responses with Pamala Hurry. she had no further questions and verbalized understanding of the correct responses       Goals reviewed with patient; copy given to patient.

## 2017-07-28 NOTE — Progress Notes (Signed)
Daily Session Note  Patient Details  Name: Jenny Cooper MRN: [id number] Date of Birth: 09/17/1935 Referring Provider:     Cardiac Rehab from 05/23/2017 in [facility]  Referring Provider  Jenny Rogue MD      Encounter Date: 07/28/2017  Check In: Session Check In - 07/28/17 1647      Check-In   Staff Present  Earlean Shawl, BS, ACSM CEP, Exercise Physiologist;Joseph Jonette Eva, RN, BSN    Supervising physician immediately available to respond to emergencies  See telemetry face sheet for immediately available ER MD    Medication changes reported      No    Fall or balance concerns reported     No    Warm-up and Cool-down  Performed on first and last piece of equipment    Resistance Training Performed  Yes    VAD Patient?  No      Pain Assessment   Currently in Pain?  No/denies    Multiple Pain Sites  No          Social History   Tobacco Use  Smoking Status Former Smoker  . Packs/day: 2.00  . Years: 42.00  . Pack years: 84.00  . Types: Cigarettes  . Last attempt to quit: 08/30/2000  . Years since quitting: 16.9  Smokeless Tobacco Never Used  Tobacco Comment   quit smoking in 08/28/2000    Goals Met:  Independence with exercise equipment Exercise tolerated well Personal goals reviewed No report of cardiac concerns or symptoms Strength training completed today  Goals Unmet:  Not Applicable  Comments:  Jenny Cooper graduated today from cardiac rehab with 36 sessions completed.  Details of the patient's exercise prescription and what She needs to do in order to continue the prescription and progress were discussed with patient.  Patient was given a copy of prescription and goals.  Patient verbalized understanding.  Jenny Cooper plans to continue to exercise by exercising at home with her son.    Dr. Emily Cooper is Medical Director for [place] and [facility].

## 2017-07-28 NOTE — Progress Notes (Signed)
Cardiac Individual Treatment Plan  Patient Details  Name: Jenny Cooper MRN: [id number] Date of Birth: 1935/09/20 Referring Provider:     Cardiac Rehab from 05/23/2017 in [facility] Cardiac and Pulmonary Rehab  Referring Provider  Ida Rogue MD      Initial Encounter Date:    Cardiac Rehab from 05/23/2017 in [facility] Cardiac and Pulmonary Rehab  Date  05/23/17  Referring Provider  Ida Rogue MD      Visit Diagnosis: Heart failure, chronic systolic ([place])  Patient's Home Medications on Admission:  Current Outpatient Medications:  .  acetaminophen (TYLENOL) 500 MG tablet, Take 1,000 mg by mouth every 6 (six) hours. , Disp: , Rfl:  .  amiodarone (PACERONE) 200 MG tablet, Take 200 mg by mouth daily. , Disp: , Rfl:  .  apixaban (ELIQUIS) 5 MG TABS tablet, Take 5 mg by mouth 2 (two) times daily. , Disp: , Rfl:  .  atorvastatin (LIPITOR) 20 MG tablet, TAKE 1 TABLET (20 MG TOTAL) BY MOUTH AT BEDTIME., Disp: 30 tablet, Rfl: 5 .  Calcium Carb-Cholecalciferol (CALCIUM 600-D PO), Take 1 tablet by mouth every morning. , Disp: , Rfl:  .  Cetirizine HCl 10 MG CAPS, Take 1 capsule (10 mg total) by mouth 1 day or 1 dose. (Patient taking differently: Take 1 capsule by mouth daily. ), Disp: 30 capsule, Rfl: 5 .  furosemide (LASIX) 40 MG tablet, Take 1 tablet (40 mg total) by mouth 2 (two) times daily. (Patient taking differently: Take 40 mg by mouth 2 (two) times daily. Except on Sunday.), Disp: 60 tablet, Rfl: 6 .  ipratropium-albuterol (DUONEB) 0.5-2.5 (3) MG/3ML SOLN, Take 3 mLs by nebulization every 6 (six) hours as needed., Disp: , Rfl:  .  levothyroxine (SYNTHROID, LEVOTHROID) 75 MCG tablet, TAKE 1 TABLET (75 MCG TOTAL) BY MOUTH DAILY BEFORE BREAKFAST., Disp: 30 tablet, Rfl: 5 .  metoprolol succinate (TOPROL-XL) 25 MG 24 hr tablet, TAKE 1 TABLET BY MOUTH EVERY MORNING AND 2 TABS IN THE EVENING, Disp: 90 tablet, Rfl: 3 .  sacubitril-valsartan (ENTRESTO) 24-26 MG, Take 1 tablet by mouth 2 (two)  times daily., Disp: 60 tablet, Rfl: 6 .  senna (SENOKOT) 8.6 MG TABS tablet, Take 1 tablet (8.6 mg total) by mouth daily. (Patient not taking: Reported on 07/12/2017), Disp: 30 each, Rfl: 0 .  umeclidinium bromide (INCRUSE ELLIPTA) 62.5 MCG/INH AEPB, Inhale 1 puff into the lungs daily., Disp: 1 each, Rfl: 0 No current facility-administered medications for this visit.   Facility-Administered Medications Ordered in Other Visits:  .  sodium chloride 0.9 % injection 10 mL, 10 mL, Intravenous, PRN, Choksi, Janak, MD, 10 mL at 02/19/15 1000 .  sodium chloride flush (NS) 0.9 % injection 10 mL, 10 mL, Intravenous, PRN, Cammie Sickle, MD, 10 mL at 02/16/16 1048  Past Medical History: Past Medical History:  Diagnosis Date  . Atypical atrial flutter ([place])    a. s/p ablation 07/27/2013 followed by Dr. Rockey Situ  . CAD (coronary artery disease)    a. s/p MI x 2 in 2002 s/p PCI x 2 in 2002; b. s/p 2v CABG 2002; c. stress echo 07/2004 w/ evi of pos & inf infarct & no evi of ischemia; d. 4/08 dipyridamole scan w/ multiple areas of infarct, no ischemia, EF 49%; e. cath 04/28/15 3v CAD, med Rx rec, no targets for revasc, LM lum irregs, pLAD 30%, 100%, ost-pLCx 60%, [name] 99%, OM2 100%, p-mRCA 90%, [name] 100% L-R collats, VG-mLAD irregs, VG-OM2 oc  . Carcinoma of right  lung ([place]) 01/03/2015   a. followed by Dr. Oliva Bustard  . Chronic systolic CHF (congestive heart failure) ([place])    a. echo 03/2015: EF 30-35%, sev ant/inf/pos HK, in mild to mod MR  . Complication of anesthesia    more recently patient oxygen levels do not rebound as quickly  . Compressed spine fracture ([place]) 08/06/2016   lumbar 2, t11, t12  . COPD (chronic obstructive pulmonary disease) ([place])   . Fractured pelvis ([place]) 05/26/2016   2 places  . GERD (gastroesophageal reflux disease)   . History of blood clots    12/2001  . History of colonoscopy 2013  . History of mammography, screening 2015  . History of Papanicolaou smear of cervix 2013  .  HLD (hyperlipidemia)   . HTN (hypertension)   . Hypothyroidism   . Lung cancer ([place])   . Mitral regurgitation    a. s/p mitral ring placement 09/2000; b. echo 09/2010: EF 50%, inf HK, post AK, mild MR, prosthetic mitral valve ring w/ peak gradient of 10 mmHg; b. echo 2/13: EF 50%, mild MR/TR     . Myocardial infarction ([place])    X 2 (LAST ONE IN 2002)  . Neuropathy   . Pacemaker    a. MDT 2002; b. generator replacement 2013; c. followed by Dr. Omelia [place], MD  . PAF (paroxysmal atrial fibrillation) ([place])    a. on Eliquis   . Personal history of chemotherapy   . Personal history of radiation therapy     Tobacco Use: Social History   Tobacco Use  Smoking Status Former Smoker  . Packs/day: 2.00  . Years: 42.00  . Pack years: 84.00  . Types: Cigarettes  . Last attempt to quit: 08/30/2000  . Years since quitting: 16.9  Smokeless Tobacco Never Used  Tobacco Comment   quit smoking in 08/28/2000    Labs: Recent Review Flowsheet Data    Labs for ITP Cardiac and Pulmonary Rehab Latest Ref Rng & Units 01/05/2016 02/16/2016 06/29/2016 12/27/2016 04/06/2017   Cholestrol 100 - 199 mg/dL - 128 - - -   LDLCALC 0 - 99 mg/dL - 45 - - -   HDL >39 mg/dL - 56 - - -   Trlycerides 0 - 149 mg/dL - 133 - - -   Hemoglobin A1c 4.8 - 5.6 % 7.5(H) - 6.3(H) 5.3 -   HCO3 20.0 - 28.0 mmol/L - - - - 36.1(H)   ACIDBASEDEF 0.0 - 2.0 mmol/L - - - - -       Exercise Target Goals:    Exercise Program Goal: Individual exercise prescription set with THRR, safety & activity barriers. Participant demonstrates ability to understand and report RPE using BORG scale, to self-measure pulse accurately, and to acknowledge the importance of the exercise prescription.  Exercise Prescription Goal: Starting with aerobic activity 30 plus minutes a day, 3 days per week for initial exercise prescription. Provide home exercise prescription and guidelines that participant acknowledges understanding prior to discharge.  Activity  Barriers & Risk Stratification: Activity Barriers & Cardiac Risk Stratification - 05/23/17 1337      Activity Barriers & Cardiac Risk Stratification   Activity Barriers  Back Problems;Joint Problems;Shortness of Breath;Decreased Ventricular Function;Muscular Weakness;Neck/Spine Problems;Deconditioning    Cardiac Risk Stratification  High       6 Minute Walk: 6 Minute Walk    Row Name 05/23/17 1622 07/27/17 1748       6 Minute Walk   Phase  Initial  Discharge    Distance  667 feet  755 feet    Distance % Change  -  13 %    Distance Feet Change  -  88 ft    Walk Time  4.9 minutes  5.75 minutes    # of Rest Breaks  2 21 sec, 45 sec  1    MPH  1.54  1.5    METS  1.19  1.4    RPE  17  15    Perceived Dyspnea   2  3    VO2 Peak  4.18  4.97    Symptoms  Yes (comment)  Yes (comment)    Comments  back pain 7-8/10 (chronic), pulling e-cylinder as she left her tank in the car  back pain 9/10 - pulling O2 canister    Resting HR  62 bpm  65 bpm    Resting BP  124/74  126/64    Resting Oxygen Saturation   94 %  -    Exercise Oxygen Saturation  during 6 min walk  91 %  97 %    Max Ex. HR  91 bpm  98 bpm    Max Ex. BP  132/70  132/68    2 Minute Post BP  120/60  -      Interval HR   1 Minute HR  93  -    2 Minute HR  91  -    3 Minute HR  89  -    4 Minute HR  88  -    5 Minute HR  97  -    6 Minute HR  78  -    2 Minute Post HR  87  -    Interval Heart Rate?  Yes  -      Interval Oxygen   Interval Oxygen?  Yes  -    Baseline Oxygen Saturation %  94 %  -    1 Minute Oxygen Saturation %  91 %  -    1 Minute Liters of Oxygen  2 L  -    2 Minute Oxygen Saturation %  91 %  -    2 Minute Liters of Oxygen  2 L  -    3 Minute Oxygen Saturation %  95 %  -    3 Minute Liters of Oxygen  2 L  -    4 Minute Oxygen Saturation %  95 %  -    4 Minute Liters of Oxygen  2 L  -    5 Minute Oxygen Saturation %  95 %  -    5 Minute Liters of Oxygen  2 L  -    6 Minute Oxygen Saturation %  95 %   -    6 Minute Liters of Oxygen  2 L  -    2 Minute Post Oxygen Saturation %  91 %  -    2 Minute Post Liters of Oxygen  2 L  -       Oxygen Initial Assessment: Oxygen Initial Assessment - 06/23/17 1747      Initial 6 min Walk   Oxygen Used  Continuous    Liters per minute  2       Oxygen Re-Evaluation: Oxygen Re-Evaluation    Row Name 06/08/17 1629 06/15/17 1834 07/14/17 1657         Program Oxygen Prescription   Program Oxygen Prescription  Continuous  -  Continuous     Liters  per minute  2  2  -       Home Oxygen   Home Oxygen Device  E-Tanks  -  E-Tanks     Sleep Oxygen Prescription  -  -  Continuous     Liters per minute  2  -  2     Home Exercise Oxygen Prescription  Continuous  -  Continuous     Liters per minute  2  -  2     Home at Rest Exercise Oxygen Prescription  Continuous  -  -     Liters per minute  2  -  2     Compliance with Home Oxygen Use  Yes  -  Yes       Goals/Expected Outcomes   Short Term Goals  To learn and exhibit compliance with exercise, home and travel O2 prescription;To learn and understand importance of monitoring SPO2 with pulse oximeter and demonstrate accurate use of the pulse oximeter.;To learn and understand importance of maintaining oxygen saturations>88%;To learn and demonstrate proper pursed lip breathing techniques or other breathing techniques.;To learn and demonstrate proper use of respiratory medications  -  To learn and exhibit compliance with exercise, home and travel O2 prescription;To learn and understand importance of monitoring SPO2 with pulse oximeter and demonstrate accurate use of the pulse oximeter.;To learn and understand importance of maintaining oxygen saturations>88%;To learn and demonstrate proper pursed lip breathing techniques or other breathing techniques.;To learn and demonstrate proper use of respiratory medications     Long  Term Goals  Exhibits compliance with exercise, home and travel O2 prescription;Verbalizes  importance of monitoring SPO2 with pulse oximeter and return demonstration;Maintenance of O2 saturations>88%;Exhibits proper breathing techniques, such as pursed lip breathing or other method taught during program session  -  Exhibits compliance with exercise, home and travel O2 prescription;Verbalizes importance of monitoring SPO2 with pulse oximeter and return demonstration;Maintenance of O2 saturations>88%;Exhibits proper breathing techniques, such as pursed lip breathing or other method taught during program session;Compliance with respiratory medication;Demonstrates proper use of MDI's     Comments  -  -  Lestine stated that her SOB is still a limiting factor in her daily life but she is compliant with meds and oxygen and uses PLB to help with SOB. Her O2 Saturations are reported to be maintained above 88%.      Goals/Expected Outcomes  -  -  Short: Shelah will continue to monitor her O2 saturations and use oxygen and other respiratory meds. Long: Azarya will see imporvements in SOB and will not be limited as much in daily activities because of SOB.         Oxygen Discharge (Final Oxygen Re-Evaluation): Oxygen Re-Evaluation - 07/14/17 1657      Program Oxygen Prescription   Program Oxygen Prescription  Continuous      Home Oxygen   Home Oxygen Device  E-Tanks    Sleep Oxygen Prescription  Continuous    Liters per minute  2    Home Exercise Oxygen Prescription  Continuous    Liters per minute  2    Liters per minute  2    Compliance with Home Oxygen Use  Yes      Goals/Expected Outcomes   Short Term Goals  To learn and exhibit compliance with exercise, home and travel O2 prescription;To learn and understand importance of monitoring SPO2 with pulse oximeter and demonstrate accurate use of the pulse oximeter.;To learn and understand importance of maintaining oxygen saturations>88%;To learn and demonstrate  proper pursed lip breathing techniques or other breathing techniques.;To learn and  demonstrate proper use of respiratory medications    Long  Term Goals  Exhibits compliance with exercise, home and travel O2 prescription;Verbalizes importance of monitoring SPO2 with pulse oximeter and return demonstration;Maintenance of O2 saturations>88%;Exhibits proper breathing techniques, such as pursed lip breathing or other method taught during program session;Compliance with respiratory medication;Demonstrates proper use of MDI's    Comments  Meleena stated that her SOB is still a limiting factor in her daily life but she is compliant with meds and oxygen and uses PLB to help with SOB. Her O2 Saturations are reported to be maintained above 88%.     Goals/Expected Outcomes  Short: Avory will continue to monitor her O2 saturations and use oxygen and other respiratory meds. Long: Breunna will see imporvements in SOB and will not be limited as much in daily activities because of SOB.        Initial Exercise Prescription: Initial Exercise Prescription - 05/23/17 1600      Date of Initial Exercise RX and Referring Provider   Date  05/23/17    Referring Provider  Ida Rogue MD      Oxygen   Oxygen  Continuous    Liters  2      Treadmill   MPH  1.5    Grade  0    Minutes  15    METs  2.15      NuStep   Level  1    SPM  80    Minutes  15    METs  1.5      Arm Ergometer   Level  1    Watts  12    RPM  50    Minutes  15    METs  1.5      Prescription Details   Frequency (times per week)  3    Duration  Progress to 45 minutes of aerobic exercise without signs/symptoms of physical distress      Intensity   THRR 40-80% of Max Heartrate  94-125    Ratings of Perceived Exertion  11-13    Perceived Dyspnea  0-4      Progression   Progression  Continue to progress workloads to maintain intensity without signs/symptoms of physical distress.      Resistance Training   Training Prescription  Yes    Weight  3 lbs    Reps  10-15       Perform Capillary Blood Glucose  checks as needed.  Exercise Prescription Changes: Exercise Prescription Changes    Row Name 05/23/17 1300 06/16/17 1400 06/29/17 1200 06/30/17 1400 07/13/17 1200     Response to Exercise   Blood Pressure (Admit)  124/72  112/62  104/62  -  122/64   Blood Pressure (Exercise)  132/70  126/68  118/76  -  124/66   Blood Pressure (Exit)  120/60  98/60  102/56  -  132/60   Heart Rate (Admit)  62 bpm  77 bpm  76 bpm  -  72 bpm   Heart Rate (Exercise)  91 bpm  92 bpm  74 bpm  -  76 bpm   Heart Rate (Exit)  73 bpm  63 bpm  60 bpm  -  63 bpm   Oxygen Saturation (Admit)  94 %  -  -  -  -   Oxygen Saturation (Exercise)  95 %  -  -  -  -  Rating of Perceived Exertion (Exercise)  17  13  13   -  13   Perceived Dyspnea (Exercise)  2  -  -  -  -   Symptoms  back pain 7-8/10  -  -  -  -   Comments  walk test results  back hurt on TM - 02 tank low  none  -  none   Duration  -  Continue with 45 min of aerobic exercise without signs/symptoms of physical distress.  Continue with 45 min of aerobic exercise without signs/symptoms of physical distress.  -  Continue with 45 min of aerobic exercise without signs/symptoms of physical distress.   Intensity  -  THRR unchanged  THRR unchanged  -  THRR unchanged     Progression   Progression  -  Continue to progress workloads to maintain intensity without signs/symptoms of physical distress.  Continue to progress workloads to maintain intensity without signs/symptoms of physical distress.  -  Continue to progress workloads to maintain intensity without signs/symptoms of physical distress.   Average METs  -  2.35  1.9  -  1.95     Resistance Training   Training Prescription  -  Yes  Yes  -  Yes   Weight  -  3 lb  3 lb  -  3 lb   Reps  -  10-15  10-15  -  10-15     Interval Training   Interval Training  -  No  No  -  No     Oxygen   Oxygen  Continuous  Continuous  Continuous  -  Continuous   Liters  2  2  2   -  2     Treadmill   MPH  -  1.4  1.1  -  -   Grade   -  0  0  -  -   Minutes  -  15  15  -  -   METs  -  2.07  1.92  -  -     NuStep   Level  -  -  1  -  5   SPM  -  -  80  -  -   Minutes  -  -  15  -  15   METs  -  -  1.9  -  2.1     Arm Ergometer   Level  -  1  -  -  1   RPM  -  50  -  -  50   Minutes  -  15  -  -  15   METs  -  1.8  -  -  1.8     Home Exercise Plan   Plans to continue exercise at  -  -  [facility] considering FF/ walking at home  [facility] considering FF/ walking at home   Frequency  -  -  -  Add 1 additional day to program exercise sessions.  Add 1 additional day to program exercise sessions.   Initial Home Exercises Provided  -  -  -  06/29/17  06/29/17      Exercise Comments: Exercise Comments    Row Name 06/01/17 1732 06/15/17 1809 06/30/17 1437 07/28/17 1654     Exercise Comments  First full day of exercise!  Patient was oriented to gym and equipment including functions, settings, policies, and procedures.  Patient's individual exercise  prescription and treatment plan were reviewed.  All starting workloads were established based on the results of the 6 minute walk test done at initial orientation visit.  The plan for exercise progression was also introduced and progression will be customized based on patient's performance and goals.  Aneka had some back pain today so she will try not doing TM and see if that helps.  Home exercise goals reviewed - RPE, THR, safety. Pt voiced understanding  Azile graduated today from cardiac rehab with 36 sessions completed.  Details of the patient's exercise prescription and what She needs to do in order to continue the prescription and progress were discussed with patient.  Patient was given a copy of prescription and goals.  Patient verbalized understanding.  Cynde plans to continue to exercise by exercising at home with her son.       Exercise Goals and Review: Exercise Goals    Row Name 05/23/17 1628             Exercise Goals   Increase  Physical Activity  Yes       Intervention  Provide advice, education, support and counseling about physical activity/exercise needs.;Develop an individualized exercise prescription for aerobic and resistive training based on initial evaluation findings, risk stratification, comorbidities and participant's personal goals.       Expected Outcomes  Achievement of increased cardiorespiratory fitness and enhanced flexibility, muscular endurance and strength shown through measurements of functional capacity and personal statement of participant.       Increase Strength and Stamina  Yes       Intervention  Provide advice, education, support and counseling about physical activity/exercise needs.;Develop an individualized exercise prescription for aerobic and resistive training based on initial evaluation findings, risk stratification, comorbidities and participant's personal goals.       Expected Outcomes  Achievement of increased cardiorespiratory fitness and enhanced flexibility, muscular endurance and strength shown through measurements of functional capacity and personal statement of participant.       Able to understand and use rate of perceived exertion (RPE) scale  Yes       Intervention  Provide education and explanation on how to use RPE scale       Expected Outcomes  Short Term: Able to use RPE daily in rehab to express subjective intensity level;Long Term:  Able to use RPE to guide intensity level when exercising independently       Able to understand and use Dyspnea scale  Yes       Intervention  Provide education and explanation on how to use Dyspnea scale       Expected Outcomes  Short Term: Able to use Dyspnea scale daily in rehab to express subjective sense of shortness of breath during exertion;Long Term: Able to use Dyspnea scale to guide intensity level when exercising independently       Knowledge and understanding of Target Heart Rate Range (THRR)  Yes       Intervention  Provide education  and explanation of THRR including how the numbers were predicted and where they are located for reference       Expected Outcomes  Short Term: Able to state/look up THRR;Long Term: Able to use THRR to govern intensity when exercising independently;Short Term: Able to use daily as guideline for intensity in rehab       Able to check pulse independently  Yes       Intervention  Provide education and demonstration on how to check pulse in carotid  and radial arteries.;Review the importance of being able to check your own pulse for safety during independent exercise       Expected Outcomes  Short Term: Able to explain why pulse checking is important during independent exercise;Long Term: Able to check pulse independently and accurately       Understanding of Exercise Prescription  Yes       Intervention  Provide education, explanation, and written materials on patient's individual exercise prescription       Expected Outcomes  Short Term: Able to explain program exercise prescription;Long Term: Able to explain home exercise prescription to exercise independently          Exercise Goals Re-Evaluation : Exercise Goals Re-Evaluation    Row Name 06/01/17 1732 06/16/17 1451 06/22/17 1701 06/29/17 1203 06/30/17 1437     Exercise Goal Re-Evaluation   Exercise Goals Review  Increase Physical Activity;Increase Strength and Stamina;Able to understand and use rate of perceived exertion (RPE) scale  Increase Physical Activity;Increase Strength and Stamina  Increase Physical Activity;Increase Strength and Stamina  Increase Physical Activity;Increase Strength and Stamina  Increase Physical Activity;Increase Strength and Stamina;Able to understand and use rate of perceived exertion (RPE) scale;Knowledge and understanding of Target Heart Rate Range (THRR)   Comments  Reviewed RPE scale, THR and program prescription with pt today.  Pt voiced understanding and was given a copy of goals to take home.   Lyanne feels her  back is worse since starting class.  Staff recommended she leave off TM for the next couple sessions and see if that helps with pain.  Staff also recommended she see PT.  Shian has been doing chair squats at home.  She feels this has helped her back and legs.  Andi has done better on the TM with reduced speed.  Staff will continue to monitor progress.  Reviewed RPE scale, THR and home exercise with pt today.  Pt voiced understanding and was given a copy of goals to take home.    Expected Outcomes  Short: Use RPE daily to regulate intensity.  Long: Follow program prescription in THR.  Short - Mahina wil do seated exercise and back pain will resolve.  Long - [place] will slowly add walking back into her program.  Short - Staff will review home exercise [place] will add one day of exercise outside HT sessions.  Short - Filicia will continue to walk on TM without pain.  Long - Bernece will be able to increase speed on TM.    Short - add one day to program exercise on her own.  Long - exercise 3-5 days per week independently   Row Name 07/13/17 1213 07/14/17 1644           Exercise Goal Re-Evaluation   Exercise Goals Review  Increase Physical Activity;Increase Strength and Stamina;Able to understand and use rate of perceived exertion (RPE) scale  Increase Physical Activity;Increase Strength and Stamina;Able to understand and use rate of perceived exertion (RPE) scale;Able to check pulse independently;Knowledge and understanding of Target Heart Rate Range (THRR);Understanding of Exercise Prescription      Comments  Marshae is tolerating exercise well and has not complained of any more back pain.  Staff will continue to monitor.  Arletta stated that she feels stronger since being consistant with her exercise in the program. She stated that she can now walk down the hall way to rehab when she could not do this before. She also stated that she feels stonger and more independent  at home since starting  the program.       Expected Outcomes  Short - Marland Mcalpine will continue to attend regularly.  Long - miguel will exercise independently.  Short: Avanna will continue to make strength and stamina gains by attending cardiac rehab regularly. Long: Torry will safely and independently exercise upon graduation from cardiac rehab.          Discharge Exercise Prescription (Final Exercise Prescription Changes): Exercise Prescription Changes - 07/13/17 1200      Response to Exercise   Blood Pressure (Admit)  122/64    Blood Pressure (Exercise)  124/66    Blood Pressure (Exit)  132/60    Heart Rate (Admit)  72 bpm    Heart Rate (Exercise)  76 bpm    Heart Rate (Exit)  63 bpm    Rating of Perceived Exertion (Exercise)  13    Comments  none    Duration  Continue with 45 min of aerobic exercise without signs/symptoms of physical distress.    Intensity  THRR unchanged      Progression   Progression  Continue to progress workloads to maintain intensity without signs/symptoms of physical distress.    Average METs  1.95      Resistance Training   Training Prescription  Yes    Weight  3 lb    Reps  10-15      Interval Training   Interval Training  No      Oxygen   Oxygen  Continuous    Liters  2      NuStep   Level  5    Minutes  15    METs  2.1      Arm Ergometer   Level  1    RPM  50    Minutes  15    METs  1.8      Home Exercise Plan   Plans to continue exercise at  [facility] considering FF/ walking at home    Frequency  Add 1 additional day to program exercise sessions.    Initial Home Exercises Provided  06/29/17       Nutrition:  Target Goals: Understanding of nutrition guidelines, daily intake of sodium <1536m, cholesterol <2041m calories 30% from fat and 7% or less from saturated fats, daily to have 5 or more servings of fruits and vegetables.  Biometrics: Pre Biometrics - 05/23/17 1628      Pre Biometrics   Height  5' 5"  (1.651 m)    Weight  164 lb 4.8  oz (74.5 kg)    Waist Circumference  39 inches    Hip Circumference  38 inches    Waist to Hip Ratio  1.03 %    BMI (Calculated)  27.34    Single Leg Stand  1.02 seconds      Post Biometrics - 07/27/17 1747       Post  Biometrics   Height  5' 5"  (1.651 m)    Weight  164 lb (74.4 kg)    Waist Circumference  38.5 inches    Hip Circumference  39.5 inches    Waist to Hip Ratio  0.97 %    BMI (Calculated)  27.29    Single Leg Stand  2.73 seconds       Nutrition Therapy Plan and Nutrition Goals: Nutrition Therapy & Goals - 07/14/17 1704      Nutrition Therapy   RD appointment defered  Yes       Nutrition  Discharge: Rate Your Plate Scores: Nutrition Assessments - 05/23/17 1340      MEDFICTS Scores   Pre Score  19       Nutrition Goals Re-Evaluation:   Nutrition Goals Discharge (Final Nutrition Goals Re-Evaluation):   Psychosocial: Target Goals: Acknowledge presence or absence of significant depression and/or stress, maximize coping skills, provide positive support system. Participant is able to verbalize types and ability to use techniques and skills needed for reducing stress and depression.   Initial Review & Psychosocial Screening: Initial Psych Review & Screening - 05/23/17 1340      Initial Review   Current issues with  Current Depression;Current Sleep Concerns;Current Stress Concerns Sleeping 12-14 hours a day. cannot do much of anything she likes to do because of back pain that occurs after standing for periods of time.     Source of Stress Concerns  Chronic Illness;Financial    Comments  medications are coasting 300 dollars more now that she has been switched to 2 new meds.  Unable to get coupon pricing since has Medicare.  Physician is helping every now and then with samples. recent back surgery in Dec 2017      [place]?  Yes family      Barriers   Psychosocial barriers to participate in program  There are no identifiable  barriers or psychosocial needs.;The patient should benefit from training in stress management and relaxation.      Screening Interventions   Interventions  Encouraged to exercise;Program counselor consult;Provide feedback about the scores to participant;To provide support and resources with identified psychosocial needs;Yes    Expected Outcomes  Short Term goal: Utilizing psychosocial counselor, staff and physician to assist with identification of specific Stressors or current issues interfering with healing process. Setting desired goal for each stressor or current issue identified.;Long Term Goal: Stressors or current issues are controlled or eliminated.;Short Term goal: Identification and review with participant of any Quality of Life or Depression concerns found by scoring the questionnaire.;Long Term goal: The participant improves quality of Life and PHQ9 Scores as seen by post scores and/or verbalization of changes       Quality of Life Scores:  Quality of Life - 05/23/17 1345      Quality of Life Scores   Health/Function Pre  14.79 %    Socioeconomic Pre  12.67 %    Psych/Spiritual Pre  10.93 %    Family Pre  30 %    GLOBAL Pre  15.52 %       PHQ-9: Recent Review Flowsheet Data    Depression screen [facility] 2/9 05/23/2017 05/18/2017 04/14/2017 03/07/2017 08/02/2016   Decreased Interest 1 0 0 0 0   Down, Depressed, Hopeless 1 3 0 0 0   PHQ - 2 Score 2 3 0 0 0   Altered sleeping 1 - - - -   Tired, decreased energy 1 - - - -   Change in appetite 1 - - - -   Feeling bad or failure about yourself  0 - - - -   Trouble concentrating 0 - - - -   Moving slowly or fidgety/restless 0 - - - -   Suicidal thoughts 0 - - - -   PHQ-9 Score 5 - - - -   Difficult doing work/chores Somewhat difficult - - - -     Interpretation of Total Score  Total Score Depression Severity:  1-4 = Minimal depression, 5-9 = Mild depression, 10-14 =  Moderate depression, 15-19 = Moderately severe depression, 20-27 =  Severe depression   Psychosocial Evaluation and Intervention: Psychosocial Evaluation - 06/06/17 1700      Psychosocial Evaluation & Interventions   Interventions  Encouraged to exercise with the program and follow exercise prescription;Relaxation education;Stress management education    Comments  Counselor met with Ms. Terri Skains) today for initial psychosocial evaluation.  She is a 81 year old who has multiple health issues including conditions of her heart and lungs.  She is on Oxygen.  Kyera has a strong support system with a son and (2) daughters who live locally.  She reports sleeping "too much" with typically 12 hours or more/night.  She has a good appetite.  Sonji denies a history of depression or anxiety; but admits to some current symptoms with all of her health issues and some financial concerns at this time.  She states her mood is generally stable but with a back surgery earlier this year; lung cancer two years ago and now her current condition, it is difficult to be positive lately.  Nicolet's goals for this program are to get some strength back and to walk better (which she has not been able to do since her back surgery 10 months ago).  Staff will follow with Pamala Hurry throughout the course of this program.     Expected Outcomes  Diasha will benefit from consistent exercise to achieve her stated goals.  The educational and psychoeducational components will be helpful in learning more about her condition and managing/coping more positively.  Staff will follow.     Continue Psychosocial Services   Follow up required by staff       Psychosocial Re-Evaluation: Psychosocial Re-Evaluation    Row Name 06/08/17 0716 06/22/17 1706 07/13/17 1732 07/14/17 1704       Psychosocial Re-Evaluation   Current issues with  Current Stress Concerns  -  None Identified  None Identified    Comments  Brittnei is still using oxgyen. Still has a lot of bills due to recent illness  The cost of  medication is down for 3 months due to meeting an insurance maximum but will go back up after the first of year.  Yalitza did not state any new concerns.  Eshani did not state any new concerns.    Expected Outcomes  Exercise should help with stress.   Brelynn understands the need for the medications long term.  Will continue to follow up with Dr.  Sheran Fava - Katalyn will continue to attend HT.  Long - Abygayle was givne ino on the Med Mgmt clinic to help with med costs.  Short: Mellony will continue to attend HT for which she will get psycological  and emotional benefits from regular exercise. Long: Audryanna will continue exercise independently upon gradation to continue to recieve psycological and emotional benefits from exercise.     Interventions  Encouraged to attend Cardiac Rehabilitation for the exercise  -  -  Encouraged to attend Cardiac Rehabilitation for the exercise    Continue Psychosocial Services   Follow up required by staff  -  -  Follow up required by staff    Comments  -  -  -  Currently with insurance her med cost has gone down to about $100/month for the next 3 months, but after the new year the cost will increase again.       Initial Review   Source of Stress Concerns  Financial  -  -  Financial;Chronic Illness  Psychosocial Discharge (Final Psychosocial Re-Evaluation): Psychosocial Re-Evaluation - 07/14/17 1704      Psychosocial Re-Evaluation   Current issues with  None Identified    Comments  Jazzie did not state any new concerns.    Expected Outcomes  Short: Tatyana will continue to attend HT for which she will get psycological  and emotional benefits from regular exercise. Long: Cleotilde will continue exercise independently upon gradation to continue to recieve psycological and emotional benefits from exercise.     Interventions  Encouraged to attend Cardiac Rehabilitation for the exercise    Continue Psychosocial Services   Follow up required by staff    Comments   Currently with insurance her med cost has gone down to about $100/month for the next 3 months, but after the new year the cost will increase again.       Initial Review   Source of Stress Concerns  Financial;Chronic Illness       Vocational Rehabilitation: Provide vocational rehab assistance to qualifying candidates.   Vocational Rehab Evaluation & Intervention: Vocational Rehab - 05/23/17 1348      Initial Vocational Rehab Evaluation & Intervention   Assessment shows need for Vocational Rehabilitation  No       Education: Education Goals: Education classes will be provided on a variety of topics geared toward better understanding of heart health and risk factor modification. Participant will state understanding/return demonstration of topics presented as noted by education test scores.  Learning Barriers/Preferences: Learning Barriers/Preferences - 05/23/17 1346      Learning Barriers/Preferences   Learning Barriers  Exercise Concerns concerned about back pain    Learning Preferences  None       Education Topics: General Nutrition Guidelines/Fats and Fiber: -Group instruction provided by verbal, written material, models and posters to present the general guidelines for heart healthy nutrition. Gives an explanation and review of dietary fats and fiber.   Cardiac Rehab from 07/27/2017 in [facility] Cardiac and Pulmonary Rehab  Date  06/13/17  Educator  PI  Instruction Review Code  1- Verbalizes Understanding      Controlling Sodium/Reading Food Labels: -Group verbal and written material supporting the discussion of sodium use in heart healthy nutrition. Review and explanation with models, verbal and written materials for utilization of the food label.   Cardiac Rehab from 07/27/2017 in [facility] Cardiac and Pulmonary Rehab  Date  06/20/17  Educator  PI  Instruction Review Code  1- Verbalizes Understanding      Exercise Physiology & Risk Factors: - Group verbal and written  instruction with models to review the exercise physiology of the cardiovascular system and associated critical values. Details cardiovascular disease risk factors and the goals associated with each risk factor.   Cardiac Rehab from 07/27/2017 in [facility] Cardiac and Pulmonary Rehab  Date  06/27/17  Educator  [facility]  Instruction Review Code  1- Verbalizes Understanding      Aerobic Exercise & Resistance Training: - Gives group verbal and written discussion on the health impact of inactivity. On the components of aerobic and resistive training programs and the benefits of this training and how to safely progress through these programs.   Cardiac Rehab from 07/27/2017 in [facility] Cardiac and Pulmonary Rehab  Date  06/29/17  Educator  AS  Instruction Review Code  1- Verbalizes Understanding      Flexibility, Balance, General Exercise Guidelines: - Provides group verbal and written instruction on the benefits of flexibility and balance training programs. Provides general exercise guidelines with specific guidelines  to those with heart or lung disease. Demonstration and skill practice provided.   Cardiac Rehab from 07/27/2017 in [facility] Cardiac and Pulmonary Rehab  Date  07/04/17  Educator  [facility]  Instruction Review Code  1- Verbalizes Understanding      Stress Management: - Provides group verbal and written instruction about the health risks of elevated stress, cause of high stress, and healthy ways to reduce stress.   Cardiac Rehab from 07/27/2017 in [facility] Cardiac and Pulmonary Rehab  Date  07/13/17  Educator  [facility]  Instruction Review Code  1- Verbalizes Understanding      Depression: - Provides group verbal and written instruction on the correlation between heart/lung disease and depressed mood, treatment options, and the stigmas associated with seeking treatment.   Cardiac Rehab from 07/27/2017 in [facility] Cardiac and Pulmonary Rehab  Date  06/15/17  Educator  [facility]  Instruction Review Code  1- Verbalizes  Understanding      Anatomy & Physiology of the Heart: - Group verbal and written instruction and models provide basic cardiac anatomy and physiology, with the coronary electrical and arterial systems. Review of: AMI, Angina, Valve disease, Heart Failure, Cardiac Arrhythmia, Pacemakers, and the ICD.   Cardiac Rehab from 07/27/2017 in [facility] Cardiac and Pulmonary Rehab  Date  07/11/17  Educator  [facility]  Instruction Review Code  1- Verbalizes Understanding      Cardiac Procedures: - Group verbal and written instruction to review commonly prescribed medications for heart disease. Reviews the medication, class of the drug, and side effects. Includes the steps to properly store meds and maintain the prescription regimen. (beta blockers and nitrates)   Cardiac Rehab from 07/27/2017 in [facility] Cardiac and Pulmonary Rehab  Date  07/18/17  Educator  [facility]  Instruction Review Code  1- Verbalizes Understanding      Cardiac Medications I: - Group verbal and written instruction to review commonly prescribed medications for heart disease. Reviews the medication, class of the drug, and side effects. Includes the steps to properly store meds and maintain the prescription regimen.   Cardiac Rehab from 07/27/2017 in [facility] Cardiac and Pulmonary Rehab  Date  07/25/17  Educator  [facility]  Instruction Review Code  1- Verbalizes Understanding      Cardiac Medications II: -Group verbal and written instruction to review commonly prescribed medications for heart disease. Reviews the medication, class of the drug, and side effects. (all other drug classes)    Go Sex-Intimacy & Heart Disease, Get SMART - Goal Setting: - Group verbal and written instruction through game format to discuss heart disease and the return to sexual intimacy. Provides group verbal and written material to discuss and apply goal setting through the application of the S.M.A.R.T. Method.   Cardiac Rehab from 07/27/2017 in [facility] Cardiac and Pulmonary Rehab   Date  07/18/17  Educator  [facility]  Instruction Review Code  1- Verbalizes Understanding      Other Matters of the Heart: - Provides group verbal, written materials and models to describe Heart Failure, Angina, Valve Disease, Peripheral Artery Disease, and Diabetes in the realm of heart disease. Includes description of the disease process and treatment options available to the cardiac patient.   Cardiac Rehab from 07/27/2017 in [facility] Cardiac and Pulmonary Rehab  Date  07/11/17  Educator  [facility]  Instruction Review Code  1- Verbalizes Understanding      Exercise & Equipment Safety: - Individual verbal instruction and demonstration of equipment use and safety with use of the equipment.  Cardiac Rehab from 07/27/2017 in [facility] Cardiac and Pulmonary Rehab  Date  05/23/17  Educator  SB  Instruction Review Code  1- Verbalizes Understanding      Infection Prevention: - Provides verbal and written material to individual with discussion of infection control including proper hand washing and proper equipment cleaning during exercise session.   Cardiac Rehab from 07/27/2017 in [facility] Cardiac and Pulmonary Rehab  Date  05/23/17  Educator  SB  Instruction Review Code  1- Verbalizes Understanding      Falls Prevention: - Provides verbal and written material to individual with discussion of falls prevention and safety.   Cardiac Rehab from 07/27/2017 in [facility] Cardiac and Pulmonary Rehab  Date  05/23/17  Educator  SB  Instruction Review Code  1- Verbalizes Understanding      Diabetes: - Individual verbal and written instruction to review signs/symptoms of diabetes, desired ranges of glucose level fasting, after meals and with exercise. Acknowledge that pre and post exercise glucose checks will be done for 3 sessions at entry of program.   Other: -Provides group and verbal instruction on various topics (see comments)    Knowledge Questionnaire Score: Knowledge Questionnaire Score - 05/23/17  1347      Knowledge Questionnaire Score   Pre Score  24/28 reviewed correct responses with Pamala Hurry. she had no further questions and verbalized understanding of the correct responses       Core Components/Risk Factors/Patient Goals at Admission: Personal Goals and Risk Factors at Admission - 05/23/17 1348      Core Components/Risk Factors/Patient Goals on Admission    Weight Management  Yes;Weight Loss    Intervention  Weight Management: Develop a combined nutrition and exercise program designed to reach desired caloric intake, while maintaining appropriate intake of nutrient and fiber, sodium and fats, and appropriate energy expenditure required for the weight goal.;Weight Management: Provide education and appropriate resources to help participant work on and attain dietary goals.;Weight Management/Obesity: Establish reasonable short term and long term weight goals.    Admit Weight  164 lb 4.8 oz (74.5 kg)    Goal Weight: Short Term  132 lb (59.9 kg)    Goal Weight: Long Term  148 lb (67.1 kg)    Expected Outcomes  Short Term: Continue to assess and modify interventions until short term weight is achieved;Long Term: Adherence to nutrition and physical activity/exercise program aimed toward attainment of established weight goal;Weight Loss: Understanding of general recommendations for a balanced deficit meal plan, which promotes 1-2 lb weight loss per week and includes a negative energy balance of [phone] kcal/d;Understanding recommendations for meals to include 15-35% energy as protein, 25-35% energy from fat, 35-60% energy from carbohydrates, less than 219m of dietary cholesterol, 20-35 gm of total fiber daily;Understanding of distribution of calorie intake throughout the day with the consumption of 4-5 meals/snacks    Heart Failure  Yes    Intervention  Provide a combined exercise and nutrition program that is supplemented with education, support and counseling about heart failure. Directed  toward relieving symptoms such as shortness of breath, decreased exercise tolerance, and extremity edema.    Expected Outcomes  Improve functional capacity of life;Short term: Attendance in program 2-3 days a week with increased exercise capacity. Reported lower sodium intake. Reported increased fruit and vegetable intake. Reports medication compliance.;Short term: Daily weights obtained and reported for increase. Utilizing diuretic protocols set by physician.;Long term: Adoption of self-care skills and reduction of barriers for early signs and symptoms recognition and intervention leading  to self-care maintenance.    Hypertension  Yes    Intervention  Provide education on lifestyle modifcations including regular physical activity/exercise, weight management, moderate sodium restriction and increased consumption of fresh fruit, vegetables, and low fat dairy, alcohol moderation, and smoking cessation.;Monitor prescription use compliance.    Expected Outcomes  Short Term: Continued assessment and intervention until BP is < 140/44m HG in hypertensive participants. < 130/860mHG in hypertensive participants with diabetes, heart failure or chronic kidney disease.;Long Term: Maintenance of blood pressure at goal levels.    Lipids  Yes    Intervention  Provide education and support for participant on nutrition & aerobic/resistive exercise along with prescribed medications to achieve LDL <7010mHDL >35m76m  Expected Outcomes  Short Term: Participant states understanding of desired cholesterol values and is compliant with medications prescribed. Participant is following exercise prescription and nutrition guidelines.;Long Term: Cholesterol controlled with medications as prescribed, with individualized exercise RX and with personalized nutrition plan. Value goals: LDL < 70mg52mL > 40 mg.       Core Components/Risk Factors/Patient Goals Review:  Goals and Risk Factor Review    Row Name 06/22/17 1659 07/13/17  1727 07/14/17 1648         Core Components/Risk Factors/Patient Goals Review   Personal Goals Review  Weight Management/Obesity;Hypertension;Heart Failure  Weight Management/Obesity;Develop more efficient breathing techniques such as purse lipped breathing and diaphragmatic breathing and practicing self-pacing with activity.;Heart Failure;Hypertension;Lipids  Weight Management/Obesity;Heart Failure;Hypertension;Improve shortness of breath with ADL's;Lipids     Review  BarbaDanaijabeen decreasing portion sizes and has lost 5 lb.  Her weight is consistently down  Her goal weight is 139 lb.  Her BP has been good during [facility].    Krishika's weight has stayed about the same.  Her goal is to maintain throught the first of the year then work on losing some weight.  She is taking meds as directed.  She is concerned about her legs  hurting at night after she exercises.  She uses heated rice bags to help.  She will also try compression socks that she has during exrecise and during the day.   SHe will also ask her Dr if lipitor coudl be the problem.     Henrine's weight has been steady and her blood pressure and cholesterol have been reported to be in acceptable ranges. She is taking her meds at directed. She has recently had a check up with her doctor and had a good report.      Expected Outcomes  Short - BarbaBritani continue to exercise and watch portion sizes.  Long - She will reach her goal weight of 139.  Short - BarbaJonita try the compression socks during exercise.  Long - BarbaDamoni be able to exercise without excessive leg fatugue.  Short: BarbaJenia continue to manage her heart failure and maintain weight. Long: BarbaLeydy continue to take meds, exercise and adopt a healthy life style to improve quality of life and independence.         Core Components/Risk Factors/Patient Goals at Discharge (Final Review):  Goals and Risk Factor Review - 07/14/17 1648      Core Components/Risk  Factors/Patient Goals Review   Personal Goals Review  Weight Management/Obesity;Heart Failure;Hypertension;Improve shortness of breath with ADL's;Lipids    Review  Rubyann's weight has been steady and her blood pressure and cholesterol have been reported to be in acceptable ranges. She is taking her meds at directed. She has recently had  a check up with her doctor and had a good report.     Expected Outcomes  Short: Aki will continue to manage her heart failure and maintain weight. Long: Liv will continue to take meds, exercise and adopt a healthy life style to improve quality of life and independence.        ITP Comments: ITP Comments    Row Name 05/23/17 1331 06/08/17 0835 06/15/17 1832 06/23/17 1747 07/06/17 0559   ITP Comments  Medical review completed today. INitial ITP created and sent to medical director for cosign and changes as necessary.  Documentation of diagnosis can be found in [facility] 04/06/2017 visit  30 day review. Continue with ITP unless directed changes per Medical Director Review.   Roselind states her back hurt on the treadmill so we stopped her and had her sit. Her rhythm strip changed and heart rate up to 92. I had her stop and sit down. Her blood pressure was 104/60. Plan is to not have her do the treadmill. Shakeisha did the basic calf stretches and arm stretches seated with the rest of the [facility] class.   Tuesday had paced beats this evening. I have not noticed paced beats when she has had the heart monitor on for Cardiac Rehab. No c/o. Marni states she did not feel her pacemaker or anything different. Verdean states to her knowledge her heart rate does not go low enough for her pacemaker to kick in.   30 day review. Continue with ITP unless directed changes per Medical Director review.       Comments: discharge ITP

## 2017-08-03 ENCOUNTER — Other Ambulatory Visit: Payer: Self-pay | Admitting: Cardiovascular Disease

## 2017-08-08 ENCOUNTER — Other Ambulatory Visit: Payer: Self-pay | Admitting: Cardiovascular Disease

## 2017-08-17 ENCOUNTER — Ambulatory Visit: Payer: Medicare Other | Admitting: Family

## 2017-08-25 ENCOUNTER — Ambulatory Visit: Payer: Medicare Other

## 2017-08-25 ENCOUNTER — Encounter: Payer: Self-pay | Admitting: Internal Medicine

## 2017-08-25 ENCOUNTER — Ambulatory Visit (INDEPENDENT_AMBULATORY_CARE_PROVIDER_SITE_OTHER): Payer: Medicare Other | Admitting: Internal Medicine

## 2017-08-25 VITALS — BP 98/60 | HR 71 | Ht 65.0 in | Wt 164.0 lb

## 2017-08-25 VITALS — BP 100/60 | HR 60 | Temp 97.5°F | Resp 20 | Ht 65.0 in | Wt 164.0 lb

## 2017-08-25 DIAGNOSIS — E039 Hypothyroidism, unspecified: Secondary | ICD-10-CM | POA: Diagnosis not present

## 2017-08-25 DIAGNOSIS — Z86010 Personal history of colonic polyps: Secondary | ICD-10-CM

## 2017-08-25 DIAGNOSIS — E782 Mixed hyperlipidemia: Secondary | ICD-10-CM

## 2017-08-25 DIAGNOSIS — E119 Type 2 diabetes mellitus without complications: Secondary | ICD-10-CM

## 2017-08-25 DIAGNOSIS — Z0000 Encounter for general adult medical examination without abnormal findings: Secondary | ICD-10-CM

## 2017-08-25 DIAGNOSIS — I2510 Atherosclerotic heart disease of native coronary artery without angina pectoris: Secondary | ICD-10-CM

## 2017-08-25 NOTE — Progress Notes (Signed)
Date:  08/25/2017   Name:  CHANDA LAPERLE   DOB:  Aug 18, 1936   MRN:  [id number]   Chief Complaint: Hypothyroidism (Labs.) and Hyperlipidemia (Labs. ) Ms Obrien reports feeling better with more energy. She is on O2 continuously without new respiratory sx.  She is exercising at home with her son regularly since completely pulmonary rehab.    Thyroid Problem  Presents for follow-up visit. Symptoms include fatigue. Patient reports no constipation, depressed mood, diarrhea, palpitations, weight gain or weight loss. The symptoms have been stable. Her past medical history is significant for hyperlipidemia.  Hyperlipidemia  This is a chronic problem. The problem is controlled. Associated symptoms include myalgias and shortness of breath. Pertinent negatives include no chest pain. Current antihyperlipidemic treatment includes statins. The current treatment provides significant improvement of lipids.  Diabetes  She presents for her follow-up diabetic visit. She has type 2 diabetes mellitus. Her disease course has been stable. Pertinent negatives for hypoglycemia include no dizziness or headaches. Associated symptoms include fatigue. Pertinent negatives for diabetes include no chest pain and no weight loss.    Lab Results  Component Value Date   HGBA1C 5.3 12/27/2016     Review of Systems  Constitutional: Positive for fatigue. Negative for chills, fever, weight gain and weight loss.  Eyes: Negative for visual disturbance.  Respiratory: Positive for shortness of breath. Negative for cough, chest tightness and wheezing.   Cardiovascular: Negative for chest pain, palpitations and leg swelling.  Gastrointestinal: Negative for abdominal pain, blood in stool, constipation and diarrhea.  Genitourinary: Negative for difficulty urinating.  Musculoskeletal: Positive for myalgias.  Skin: Negative for rash and wound.  Allergic/Immunologic: Negative for environmental allergies.  Neurological: Negative  for dizziness and headaches.  Psychiatric/Behavioral: Negative for sleep disturbance.    Patient Active Problem List   Diagnosis Date Noted  . Coronary artery disease of bypass graft of native heart with stable angina pectoris ([place]) 04/21/2017  . Chronic systolic heart failure ([place]) 04/14/2017  . Sinus node dysfunction ([place]) 04/06/2017  . Back pain of thoracolumbar region 03/15/2017  . PAF (paroxysmal atrial fibrillation) ([place]) 03/05/2017  . Falls frequently 03/05/2017  . Pleural effusion, right 12/23/2016  . Cancer of hilus of right lung ([place]) 12/14/2016  . Compression fracture of L2 lumbar vertebra ([place]) 08/31/2016  . Acute midline low back pain without sciatica 08/13/2016  . Fracture of multiple pubic rami, right, closed, initial encounter ([place]) 05/30/2016  . Cardiomyopathy ([place]) 04/29/2016  . Type 2 diabetes mellitus with hemoglobin A1c goal of less than 7.0% ([place]) 11/17/2015  . Hx of adenomatous colonic polyps 11/17/2015  . Radiation pneumonitis ([place]) 10/21/2015  . Mitral regurgitation   . HLD (hyperlipidemia)   . Paroxysmal supraventricular tachycardia ([place])   . NSTEMI (non-ST elevated myocardial infarction) ([place])   . Arteriosclerosis of coronary artery 01/11/2015  . Hypothyroidism (acquired) 01/11/2015  . Disorder of peripheral nervous system 10/04/2014  . Cervical radiculopathy, chronic 10/04/2014  . Lung cancer ([place]) 10/04/2014  . Atrial flutter ([place]) 11/16/2013  . Chronic obstructive pulmonary disease ([place]) 04/24/2012  . Hypertension 08/03/2011  . Pacemaker 08/03/2011    Prior to Admission medications   Medication Sig Start Date End Date Taking? Authorizing Provider  acetaminophen (TYLENOL) 500 MG tablet Take 1,000 mg by mouth every 6 (six) hours.    Yes [provider]  amiodarone (PACERONE) 200 MG tablet Take 200 mg by mouth daily.    Yes [provider]  apixaban (ELIQUIS) 5 MG TABS tablet Take  5 mg by mouth 2 (two) times daily.  08/04/16  Yes  [provider]  atorvastatin (LIPITOR) 20 MG tablet TAKE 1 TABLET (20 MG TOTAL) BY MOUTH AT BEDTIME. 04/19/17  Yes Glean Hess, MD  Calcium Carb-Cholecalciferol (CALCIUM 600-D PO) Take 1 tablet by mouth every morning.    Yes [provider]  Cetirizine HCl 10 MG CAPS Take 1 capsule (10 mg total) by mouth 1 day or 1 dose. Patient taking differently: Take 1 capsule by mouth daily.  05/19/15  Yes Dunn, Ryan M, PA-C  ENTRESTO 24-26 MG TAKE 1 TABLET BY MOUTH TWICE A DAY 08/08/17  Yes Gollan, Kathlene November, MD  furosemide (LASIX) 40 MG tablet Take 1 tablet (40 mg total) by mouth 2 (two) times daily. Patient taking differently: Take 40 mg by mouth 2 (two) times daily. Except on Sunday. 05/03/17  Yes Gollan, Kathlene November, MD  ipratropium-albuterol (DUONEB) 0.5-2.5 (3) MG/3ML SOLN Take 3 mLs by nebulization every 6 (six) hours as needed.   Yes [provider]  levothyroxine (SYNTHROID, LEVOTHROID) 75 MCG tablet TAKE 1 TABLET (75 MCG TOTAL) BY MOUTH DAILY BEFORE BREAKFAST. 05/23/17  Yes Glean Hess, MD  metoprolol succinate (TOPROL-XL) 25 MG 24 hr tablet TAKE 1 TABLET BY MOUTH EVERY MORNING AND 2 TABS IN THE EVENING 08/03/17  Yes Gollan, Kathlene November, MD  senna (SENOKOT) 8.6 MG TABS tablet Take 1 tablet (8.6 mg total) by mouth daily. 05/27/16  Yes Veronese, [place], MD  umeclidinium bromide (INCRUSE ELLIPTA) 62.5 MCG/INH AEPB Inhale 1 puff into the lungs daily. 02/09/17  Yes Flora Lipps, MD    Allergies  Allergen Reactions  . Lovenox [Enoxaparin Sodium] Itching  . Meperidine Other (See Comments)    Other Reaction: pt does not like how it makes her feel    Past Surgical History:  Procedure Laterality Date  . ABLATION  04/2016   [facility]  . APPENDECTOMY    . CARDIAC CATHETERIZATION N/A 04/28/2015   Procedure: Left Heart Cath and Coronary Angiography;  Surgeon: Wellington Hampshire, MD;  Location: [facility];  Service: Cardiovascular;  Laterality: N/A;  . CARDIOVERSION N/A  04/08/2017   Procedure: CARDIOVERSION;  Surgeon: Wellington Hampshire, MD;  Location: [facility];  Service: Cardiovascular;  Laterality: N/A;  . COLONOSCOPY  12/2009   2 small tubular adenomas  . CORONARY ARTERY BYPASS GRAFT  09/2000  . ECTOPIC PREGNANCY SURGERY    . ELECTROMAGNETIC NAVIGATION BROCHOSCOPY N/A 10/06/2015   Procedure: ELECTROMAGNETIC NAVIGATION BRONCHOSCOPY;  Surgeon: Flora Lipps, MD;  Location: [facility];  Service: Cardiopulmonary;  Laterality: N/A;  . ENDOBRONCHIAL ULTRASOUND N/A 10/06/2015   Procedure: ENDOBRONCHIAL ULTRASOUND;  Surgeon: Flora Lipps, MD;  Location: [facility];  Service: Cardiopulmonary;  Laterality: N/A;  . KYPHOPLASTY N/A 09/28/2016   Procedure: KYPHOPLASTY;  Surgeon: Hessie Knows, MD;  Location: [facility];  Service: Orthopedics;  Laterality: N/A;  . PACEMAKER INSERTION  03/2012  . thorocentesis  12/24/2016  . VAGINAL HYSTERECTOMY     partial - left ovary remains    Social History   Tobacco Use  . Smoking status: Former Smoker    Packs/day: 1.00    Years: 40.00    Pack years: 40.00    Types: Cigarettes    Last attempt to quit: 08/30/2000    Years since quitting: 16.9  . Smokeless tobacco: Never Used  . Tobacco comment: quit smoking in 08/28/2000. Smoking cessation materials not required  Substance Use Topics  . Alcohol use: Yes  Alcohol/week: 6.0 oz    Types: 10 Glasses of wine per week    Comment: 2 glasses of wine per day  . Drug use: No     Medication list has been reviewed and updated.  PHQ 2/9 Scores 08/25/2017 07/28/2017 05/23/2017 05/18/2017  PHQ - 2 Score 0 2 2 3   PHQ- 9 Score - 10 5 -    Physical Exam  Constitutional: She is oriented to person, place, and time. She appears well-developed. No distress.  HENT:  Head: Normocephalic and atraumatic.  Cardiovascular: Normal rate, regular rhythm and normal heart sounds.  Pulmonary/Chest: Effort normal. No respiratory distress. She has decreased breath sounds. She has no wheezes.  Musculoskeletal:  Normal range of motion.  Neurological: She is alert and oriented to person, place, and time.  Skin: Skin is warm and dry. No rash noted.  Psychiatric: She has a normal mood and affect. Her behavior is normal. Thought content normal.  Nursing note and vitals reviewed.   BP 98/60   Pulse 71   Ht 5\' 5"  (1.651 m)   Wt 164 lb (74.4 kg)   SpO2 92%   BMI 27.29 kg/m   Assessment and Plan: 1. Hypothyroidism (acquired) Continue supplementation - TSH  2. Type 2 diabetes mellitus with hemoglobin A1c goal of less than 7.0% (HCC) Continue dietary control - Microalbumin / creatinine urine ratio - Hemoglobin A1c  3. Mixed hyperlipidemia On statin therapy - Lipid panel  4. Hx of adenomatous colonic polyps Discussed risk/benefit - will defer now - discuss next year   No orders of the defined types were placed in this encounter.   Partially dictated using Editor, commissioning. Any errors are unintentional.  Halina Maidens, MD [facility]  08/25/2017

## 2017-08-25 NOTE — Patient Instructions (Addendum)
Jenny Cooper , Thank you for taking time to come for your Medicare Wellness Visit. I appreciate your ongoing commitment to your health goals. Please review the following plan we discussed and let me know if I can assist you in the future.   Screening recommendations/referrals: Colonoscopy: Completed 11/24/09. Repeat every 5 years Mammogram: Completed 01/10/17. Repeat every year Bone Density: Completed 01/10/17. No longer required to repeat osteoporotic screenings Recommended yearly ophthalmology/optometry visit for glaucoma screening and checkup Recommended yearly dental visit for hygiene and checkup  Vaccinations: Influenza vaccine: Up to date Pneumococcal vaccine: Completed series Tdap vaccine: Declined. Please call your insurance company to determine your out of pocket expense. You may also receive this vaccine at your local pharmacy or Health Dept. Shingles vaccine: Up to date  Advanced directives: Please bring a copy of your POA (Power of [place]) and/or Living Will to your next appointment.   Conditions/risks identified: Recommend to drink at least 6-8 8oz glasses of water per day  Next appointment: You are scheduled to see Dr. Army Melia on 08/25/17 @ 9:00am.   Please schedule your Annual Wellness Visit with your Nurse Health Advisor in one year.  Your provider would like to you have your annual eye exam. Please contact your current eye doctor to schedule your annual eye exam to evaluate the health of your eyes. If you have not yet established care with a physician to evaluate the health of your eyes, below is a list of physicians for you to contact and to establish care.   [facility] Address: [address] [place], [place] [address]   Address: [address], [place], [name] [address]  Phone: [phone]      Phone: [phone]  Website: [email or web address]    Website: [email or web address]     [facility]    Address: [address] [place], [place], [place] [address]   Address: [place], [place], [place] [address] Phone: [phone]      Phone: [phone]    [facility] Address: [place], [place], [place] [address]  Phone: [phone]  Preventive Care 22 Years and Older, Female Preventive care refers to lifestyle choices and visits with your health care provider that can promote health and wellness. What does preventive care include?  A yearly physical exam. This is also called an annual well check.  Dental exams once or twice a year.  Routine eye exams. Ask your health care provider how often you should have your eyes checked.  Personal lifestyle choices, including:  Daily care of your teeth and gums.  Regular physical activity.  Eating a healthy diet.  Avoiding tobacco and drug use.  Limiting alcohol use.  Practicing safe sex.  Taking low-dose aspirin every day.  Taking vitamin and mineral supplements as recommended by your health care provider. What happens during an annual well check? The services and screenings done by your health care provider during your annual well check will depend on your age, overall health, lifestyle risk factors, and family history of disease. Counseling  Your health care provider may ask you questions about your:  Alcohol use.  Tobacco use.  Drug use.  Emotional well-being.  Home and relationship well-being.  Sexual activity.  Eating habits.  History of falls.  Memory and ability to understand (cognition).  Work and work Statistician.  Reproductive health. Screening  You may have  the following tests or measurements:  Height, weight, and BMI.  Blood pressure.  Lipid and cholesterol levels. These may be checked every 5 years, or more frequently if you are over 28 years old.  Skin check.  Lung cancer screening. You may have this screening every year starting at age 65 if you have a 30-pack-year history of smoking  and currently smoke or have quit within the past 15 years.  Fecal occult blood test (FOBT) of the stool. You may have this test every year starting at age 20.  Flexible sigmoidoscopy or colonoscopy. You may have a sigmoidoscopy every 5 years or a colonoscopy every 10 years starting at age 63.  Hepatitis C blood test.  Hepatitis B blood test.  Sexually transmitted disease (STD) testing.  Diabetes screening. This is done by checking your blood sugar (glucose) after you have not eaten for a while (fasting). You may have this done every 1-3 years.  Bone density scan. This is done to screen for osteoporosis. You may have this done starting at age 90.  Mammogram. This may be done every 1-2 years. Talk to your health care provider about how often you should have regular mammograms. Talk with your health care provider about your test results, treatment options, and if necessary, the need for more tests. Vaccines  Your health care provider may recommend certain vaccines, such as:  Influenza vaccine. This is recommended every year.  Tetanus, diphtheria, and acellular pertussis (Tdap, Td) vaccine. You may need a Td booster every 10 years.  Zoster vaccine. You may need this after age 64.  Pneumococcal 13-valent conjugate (PCV13) vaccine. One dose is recommended after age 3.  Pneumococcal polysaccharide (PPSV23) vaccine. One dose is recommended after age 27. Talk to your health care provider about which screenings and vaccines you need and how often you need them. This information is not intended to replace advice given to you by your health care provider. Make sure you discuss any questions you have with your health care provider. Document Released: 09/12/2015 Document Revised: 05/05/2016 Document Reviewed: 06/17/2015 Elsevier Interactive Patient Education  2017 [place] Prevention in the Home Falls can cause injuries. They can happen to people of all ages. There are many things  you can do to make your home safe and to help prevent falls. What can I do on the outside of my home?  Regularly fix the edges of walkways and driveways and fix any cracks.  Remove anything that might make you trip as you walk through a door, such as a raised step or threshold.  Trim any bushes or trees on the path to your home.  Use bright outdoor lighting.  Clear any walking paths of anything that might make someone trip, such as rocks or tools.  Regularly check to see if handrails are loose or broken. Make sure that both sides of any steps have handrails.  Any raised decks and porches should have guardrails on the edges.  Have any leaves, snow, or ice cleared regularly.  Use sand or salt on walking paths during winter.  Clean up any spills in your garage right away. This includes oil or grease spills. What can I do in the bathroom?  Use night lights.  Install grab bars by the toilet and in the tub and shower. Do not use towel bars as grab bars.  Use non-skid mats or decals in the tub or shower.  If you need to sit down in the shower, use a plastic,  non-slip stool.  Keep the floor dry. Clean up any water that spills on the floor as soon as it happens.  Remove soap buildup in the tub or shower regularly.  Attach bath mats securely with double-sided non-slip rug tape.  Do not have throw rugs and other things on the floor that can make you trip. What can I do in the bedroom?  Use night lights.  Make sure that you have a light by your bed that is easy to reach.  Do not use any sheets or blankets that are too big for your bed. They should not hang down onto the floor.  Have a firm chair that has side arms. You can use this for support while you get dressed.  Do not have throw rugs and other things on the floor that can make you trip. What can I do in the kitchen?  Clean up any spills right away.  Avoid walking on wet floors.  Keep items that you use a lot in  easy-to-reach places.  If you need to reach something above you, use a strong step stool that has a grab bar.  Keep electrical cords out of the way.  Do not use floor polish or wax that makes floors slippery. If you must use wax, use non-skid floor wax.  Do not have throw rugs and other things on the floor that can make you trip. What can I do with my stairs?  Do not leave any items on the stairs.  Make sure that there are handrails on both sides of the stairs and use them. Fix handrails that are broken or loose. Make sure that handrails are as long as the stairways.  Check any carpeting to make sure that it is firmly attached to the stairs. Fix any carpet that is loose or worn.  Avoid having throw rugs at the top or bottom of the stairs. If you do have throw rugs, attach them to the floor with carpet tape.  Make sure that you have a light switch at the top of the stairs and the bottom of the stairs. If you do not have them, ask someone to add them for you. What else can I do to help prevent falls?  Wear shoes that:  Do not have high heels.  Have rubber bottoms.  Are comfortable and fit you well.  Are closed at the toe. Do not wear sandals.  If you use a stepladder:  Make sure that it is fully opened. Do not climb a closed stepladder.  Make sure that both sides of the stepladder are locked into place.  Ask someone to hold it for you, if possible.  Clearly mark and make sure that you can see:  Any grab bars or handrails.  First and last steps.  Where the edge of each step is.  Use tools that help you move around (mobility aids) if they are needed. These include:  Canes.  Walkers.  Scooters.  Crutches.  Turn on the lights when you go into a dark area. Replace any light bulbs as soon as they burn out.  Set up your furniture so you have a clear path. Avoid moving your furniture around.  If any of your floors are uneven, fix them.  If there are any pets  around you, be aware of where they are.  Review your medicines with your doctor. Some medicines can make you feel dizzy. This can increase your chance of falling. Ask your doctor what other things  that you can do to help prevent falls. This information is not intended to replace advice given to you by your health care provider. Make sure you discuss any questions you have with your health care provider. Document Released: 06/12/2009 Document Revised: 01/22/2016 Document Reviewed: 09/20/2014 Elsevier Interactive Patient Education  2017 [employer].

## 2017-08-25 NOTE — Progress Notes (Signed)
Subjective:   Jenny Cooper is a 81 y.o. female who presents for an Initial Medicare Annual Wellness Visit.  Review of Systems    N/A  Cardiac Risk Factors include: advanced age (>44men, >62 women);dyslipidemia;hypertension;family history of premature cardiovascular disease     Objective:    Today's Vitals   08/25/17 0828  BP: 100/60  Pulse: 60  Resp: 20  Temp: (!) 97.5 F (36.4 C)  TempSrc: Oral  Weight: 164 lb (74.4 kg)  Height: 5\' 5"  (1.651 m)   Body mass index is 27.29 kg/m.  Advanced Directives 08/25/2017 07/12/2017 05/23/2017 05/18/2017 05/10/2017 04/14/2017 04/08/2017  Does Patient Have a Medical Advance Directive? Yes No Yes Yes Yes Yes Yes  Type of Paramedic of [place];Living will - [place];Living will [place];Living will [place];Living will [place];Living will [place]  Does patient want to make changes to medical advance directive? - - - - - - -  Copy of [place] in Chart? No - copy requested - - - No - copy requested - -  Would patient like information on creating a medical advance directive? - No - Patient declined - - - - -    Current Medications (verified) Outpatient Encounter Medications as of 08/25/2017  Medication Sig  . acetaminophen (TYLENOL) 500 MG tablet Take 1,000 mg by mouth every 6 (six) hours.   Marland Kitchen amiodarone (PACERONE) 200 MG tablet Take 200 mg by mouth daily.   Marland Kitchen apixaban (ELIQUIS) 5 MG TABS tablet Take 5 mg by mouth 2 (two) times daily.   Marland Kitchen atorvastatin (LIPITOR) 20 MG tablet TAKE 1 TABLET (20 MG TOTAL) BY MOUTH AT BEDTIME.  . Calcium Carb-Cholecalciferol (CALCIUM 600-D PO) Take 1 tablet by mouth every morning.   . Cetirizine HCl 10 MG CAPS Take 1 capsule (10 mg total) by mouth 1 day or 1 dose. (Patient taking differently: Take 1 capsule by mouth daily. )  . ENTRESTO 24-26 MG TAKE 1 TABLET BY MOUTH  TWICE A DAY  . furosemide (LASIX) 40 MG tablet Take 1 tablet (40 mg total) by mouth 2 (two) times daily. (Patient taking differently: Take 40 mg by mouth 2 (two) times daily. Except on Sunday.)  . levothyroxine (SYNTHROID, LEVOTHROID) 75 MCG tablet TAKE 1 TABLET (75 MCG TOTAL) BY MOUTH DAILY BEFORE BREAKFAST.  . metoprolol succinate (TOPROL-XL) 25 MG 24 hr tablet TAKE 1 TABLET BY MOUTH EVERY MORNING AND 2 TABS IN THE EVENING  . senna (SENOKOT) 8.6 MG TABS tablet Take 1 tablet (8.6 mg total) by mouth daily.  Marland Kitchen ipratropium-albuterol (DUONEB) 0.5-2.5 (3) MG/3ML SOLN Take 3 mLs by nebulization every 6 (six) hours as needed.  . umeclidinium bromide (INCRUSE ELLIPTA) 62.5 MCG/INH AEPB Inhale 1 puff into the lungs daily.   Facility-Administered Encounter Medications as of 08/25/2017  Medication  . sodium chloride 0.9 % injection 10 mL  . sodium chloride flush (NS) 0.9 % injection 10 mL    Allergies (verified) Lovenox [enoxaparin sodium] and Meperidine   History: Past Medical History:  Diagnosis Date  . Atypical atrial flutter ([place])    a. s/p ablation 07/27/2013 followed by Dr. Rockey Situ  . CAD (coronary artery disease)    a. s/p MI x 2 in 2002 s/p PCI x 2 in 2002; b. s/p 2v CABG 2002; c. stress echo 07/2004 w/ evi of pos & inf infarct & no evi of ischemia; d. 4/08 dipyridamole scan w/  multiple areas of infarct, no ischemia, EF 49%; e. cath 04/28/15 3v CAD, med Rx rec, no targets for revasc, LM lum irregs, pLAD 30%, 100%, ost-pLCx 60%, [name] 99%, OM2 100%, p-mRCA 90%, [name] 100% L-R collats, VG-mLAD irregs, VG-OM2 oc  . Carcinoma of right lung ([place]) 01/03/2015   a. followed by Dr. Oliva Bustard  . Chronic systolic CHF (congestive heart failure) ([place])    a. echo 03/2015: EF 30-35%, sev ant/inf/pos HK, in mild to mod MR  . Complication of anesthesia    more recently patient oxygen levels do not rebound as quickly  . Compressed spine fracture ([place]) 08/06/2016   lumbar 2, t11, t12  . COPD (chronic obstructive  pulmonary disease) ([place])   . Fractured pelvis ([place]) 05/26/2016   2 places  . GERD (gastroesophageal reflux disease)   . History of blood clots    12/2001  . History of colonoscopy 2013  . History of mammography, screening 2015  . History of Papanicolaou smear of cervix 2013  . HLD (hyperlipidemia)   . HTN (hypertension)   . Hypothyroidism   . Lung cancer ([place])   . Mitral regurgitation    a. s/p mitral ring placement 09/2000; b. echo 09/2010: EF 50%, inf HK, post AK, mild MR, prosthetic mitral valve ring w/ peak gradient of 10 mmHg; b. echo 2/13: EF 50%, mild MR/TR     . Myocardial infarction ([place])    X 2 (LAST ONE IN 2002)  . Neuropathy   . Pacemaker    a. MDT 2002; b. generator replacement 2013; c. followed by Dr. Omelia Blackwater, MD  . PAF (paroxysmal atrial fibrillation) ([place])    a. on Eliquis   . Personal history of chemotherapy   . Personal history of radiation therapy    Past Surgical History:  Procedure Laterality Date  . ABLATION  04/2016   [facility]  . APPENDECTOMY    . CARDIAC CATHETERIZATION N/A 04/28/2015   Procedure: Left Heart Cath and Coronary Angiography;  Surgeon: Wellington Hampshire, MD;  Location: [facility];  Service: Cardiovascular;  Laterality: N/A;  . CARDIOVERSION N/A 04/08/2017   Procedure: CARDIOVERSION;  Surgeon: Wellington Hampshire, MD;  Location: [facility];  Service: Cardiovascular;  Laterality: N/A;  . COLONOSCOPY  12/2009   2 small tubular adenomas  . CORONARY ARTERY BYPASS GRAFT  09/2000  . ECTOPIC PREGNANCY SURGERY    . ELECTROMAGNETIC NAVIGATION BROCHOSCOPY N/A 10/06/2015   Procedure: ELECTROMAGNETIC NAVIGATION BRONCHOSCOPY;  Surgeon: Flora Lipps, MD;  Location: [facility];  Service: Cardiopulmonary;  Laterality: N/A;  . ENDOBRONCHIAL ULTRASOUND N/A 10/06/2015   Procedure: ENDOBRONCHIAL ULTRASOUND;  Surgeon: Flora Lipps, MD;  Location: [facility];  Service: Cardiopulmonary;  Laterality: N/A;  . KYPHOPLASTY N/A 09/28/2016   Procedure: KYPHOPLASTY;  Surgeon: Hessie Knows, MD;  Location: [facility];  Service: Orthopedics;  Laterality: N/A;  . PACEMAKER INSERTION  03/2012  . thorocentesis  12/24/2016  . VAGINAL HYSTERECTOMY     partial - left ovary remains   Family History  Problem Relation Age of Onset  . COPD Mother        sister, and brother  . Healthy Father   . Stroke Maternal Grandmother   . Hypertension Sister   . COPD Sister   . Hypertension Brother   . COPD Brother   . Colon cancer Brother   . Heart attack Neg Hx   . Breast cancer Neg Hx    Social History   Socioeconomic History  . Marital status: Widowed  Spouse name: None  . Number of children: 6  . Years of education: None  . Highest education level: Some college, no degree  Social Needs  . Financial resource strain: Not hard at all  . Food insecurity - worry: Never true  . Food insecurity - inability: Never true  . Transportation needs - medical: No  . Transportation needs - non-medical: No  Occupational History  . Occupation: Retired  Tobacco Use  . Smoking status: Former Smoker    Packs/day: 1.00    Years: 40.00    Pack years: 40.00    Types: Cigarettes    Last attempt to quit: 08/30/2000    Years since quitting: 16.9  . Smokeless tobacco: Never Used  . Tobacco comment: quit smoking in 08/28/2000. Smoking cessation materials not required  Substance and Sexual Activity  . Alcohol use: Yes    Alcohol/week: 6.0 oz    Types: 10 Glasses of wine per week    Comment: 2 glasses of wine per day  . Drug use: No  . Sexual activity: No  Other Topics Concern  . None  Social History Narrative  . None    Tobacco Counseling Counseling given: No Comment: quit smoking in 08/28/2000. Smoking cessation materials not required   Clinical Intake:  Pre-visit preparation completed: Yes  Pain : No/denies pain  BMI - recorded: 27.29 Nutritional Status: BMI 25 -29 Overweight Nutritional Risks: None Diabetes: No  How often do you need to have someone help you when you read  instructions, pamphlets, or other written materials from your doctor or pharmacy?: 1 - Never  Interpreter Needed?: No  Information entered by :: AE[place]sole, LPN   Activities of Daily Living In your present state of health, do you have any difficulty performing the following activities: 08/25/2017 05/18/2017  Hearing? N Y  Comment denies use of hearing aids -  Vision? Tempie Donning  Comment wears reading glasses -  Difficulty concentrating or making decisions? Y Y  Comment short term memory loss -  Walking or climbing stairs? Y Y  Comment shortness of breath, use of O2 -  Dressing or bathing? N N  Doing errands, shopping? N Y  Conservation officer, nature and eating ? N -  Comment full set upper an lower dentures -  Using the Toilet? N -  In the past six months, have you accidently leaked urine? N -  Do you have problems with loss of bowel control? N -  Managing your Medications? N -  Managing your Finances? N -  Housekeeping or managing your Housekeeping? N -  Some recent data might be hidden     Immunizations and Health Maintenance Immunization History  Administered Date(s) Administered  . Influenza, High Dose Seasonal PF 06/17/2014  . Influenza,inj,Quad PF,6+ Mos 07/01/2014, 05/28/2015, 05/31/2016, 04/21/2017  . Influenza-Unspecified 07/20/2011, 05/03/2012  . Pneumococcal Conjugate-13 04/21/2017  . Pneumococcal Polysaccharide-23 08/03/2011  . Zoster 05/01/2011   Health Maintenance Due  Topic Date Due  . OPHTHALMOLOGY EXAM  01/24/1946  . URINE MICROALBUMIN  01/24/1946  . COLONOSCOPY  11/25/2014  . HEMOGLOBIN A1C  06/28/2017    Patient Care Team: Glean Hess, MD as PCP - General (Internal Medicine) Rockey Situ Kathlene November, MD as Consulting Physician (Cardiology) Cammie Sickle, MD as Consulting Physician (Internal Medicine) Alisa Graff, FNP as Nurse Practitioner (Family Medicine) Flora Lipps, MD as Consulting Physician (Pulmonary Disease) Marvia Pickles, MD as  Consulting Physician (Cardiology)  Indicate any recent Medical Services you may have received from  other than [facility] providers in the past year (date may be approximate).     Assessment:   This is a routine wellness examination for [place].  Hearing/Vision screen Vision Screening Comments: Not established with a physician for annual eye exams. Provided pt with various physicians to schedule appt  Dietary issues and exercise activities discussed: Current Exercise Habits: Structured exercise class, Type of exercise: stretching;strength training/weights, Time (Minutes): 50, Frequency (Times/Week): 3, Weekly Exercise (Minutes/Week): 150, Intensity: Mild, Exercise limited by: respiratory conditions(s)  Goals    . DIET - INCREASE WATER INTAKE     Recommend to drink at least 6-8 8oz glasses of water per day      Depression Screen PHQ 2/9 Scores 08/25/2017 07/28/2017 05/23/2017 05/18/2017 04/14/2017 03/07/2017 08/02/2016  PHQ - 2 Score 0 2 2 3  0 0 0  PHQ- 9 Score - 10 5 - - - -    Fall Risk Fall Risk  08/25/2017 05/23/2017 05/18/2017 04/14/2017 03/07/2017  Falls in the past year? No No No No No  Number falls in past yr: - - - - -  Injury with Fall? - - - - -  Follow up - - - - -    Is the patient's home free of loose throw rugs in walkways, pet beds, electrical cords, etc?   yes      Grab bars in the bathroom? yes and use of shower chair      Handrails on the stairs?   yes      Adequate lighting?   yes  Denies use of walker, cane or w/c. Also denies use of an elevated toilet seat. Uses O2, nebulizer. Denies use of CPAP.  Cognitive Function:     6CIT Screen 08/25/2017  What Year? 0 points  What month? 0 points  What time? 0 points  Count back from 20 0 points  Months in reverse 2 points  Repeat phrase 0 points  Total Score 2    Screening Tests Health Maintenance  Topic Date Due  . OPHTHALMOLOGY EXAM  01/24/1946  . URINE MICROALBUMIN  01/24/1946  . COLONOSCOPY  11/25/2014  .  HEMOGLOBIN A1C  06/28/2017  . TETANUS/TDAP  08/30/2017 (Originally 01/23/1955)  . FOOT EXAM  12/27/2017  . MAMMOGRAM  01/10/2018  . INFLUENZA VACCINE  Completed  . PNA vac Low Risk Adult  Completed  . DEXA SCAN  Addressed    Qualifies for Shingles Vaccine? Yes - Shingrix. Education has been provided regarding this vaccine. Pt has been advised to call her insurance company to determine her out of pocket expense. Advised he/she may also receive this vaccine at her local pharmacy or Health Dept. Verbalized acceptance and understanding.  Cancer Screenings: Breast: Up to date on Mammogram? Yes  Completed 01/10/17. Repeat every year Up to date of Bone Density/Dexa? Yes  Completed 01/10/17. No longer required to repeat osteoporotic screenings Colorectal: Completed 11/24/09. Repeat every 5 years   Plan:  I have personally reviewed and addressed the Medicare Annual Wellness questionnaire and have noted the following in the patient's chart:  A. Medical and social history B. Use of alcohol, tobacco or illicit drugs  C. Current medications and supplements D. Functional ability and status E.  Nutritional status F.  Physical activity G. Advance directives H. List of other physicians I.  Hospitalizations, surgeries, and ER visits in previous 12 months J.  [place] such as hearing and vision if needed, cognitive and depression L. Referrals and appointments - none  In addition, I  have reviewed and discussed with patient certain preventive protocols, quality metrics, and best practice recommendations. A written personalized care plan for preventive services as well as general preventive health recommendations were provided to patient.  Signed,  Aleatha Borer, LPN Nurse Health Advisor  MD Recommendations: Due for repeat colonoscopy. Given chemo and radiation treatments in 2016, along with many other various medical issues/concerns, repeat colonoscopy was postponed. Pt states she still does  not feel she has fully recovered. Pt would like to discuss further with Dr. Army Melia today.  Repeat mammogram will be due on or after 01/10/2018  Due for annual eye exam. Pt has been provided with a list of various providers and advised to schedule appt.  Due for Tdap vaccine. Declined my offer to administer today. Education has been provided regarding the importance of this vaccine but still declined. Pt has been advised to call her insurance company to determine her out of pocket expense. Advised she may also receive this vaccine at her local pharmacy or Health Dept. Verbalized acceptance and understanding.

## 2017-08-26 ENCOUNTER — Other Ambulatory Visit: Payer: Self-pay | Admitting: Internal Medicine

## 2017-08-26 LAB — HEMOGLOBIN A1C
Est. average glucose Bld gHb Est-mCnc: 111 mg/dL
Hgb A1c MFr Bld: 5.5 % (ref 4.8–5.6)

## 2017-08-26 LAB — MICROALBUMIN / CREATININE URINE RATIO
Creatinine, Urine: 28.9 mg/dL
Microalb/Creat Ratio: <10.4 mg/g creat (ref 0.0–30.0)

## 2017-08-26 LAB — LIPID PANEL
CHOL/HDL RATIO: 3.6 ratio (ref 0.0–4.4)
CHOLESTEROL TOTAL: 131 mg/dL (ref 100–199)
HDL: 36 mg/dL — ABNORMAL LOW (ref >39)
LDL CALC: 63 mg/dL (ref 0–99)
Triglycerides: 159 mg/dL — ABNORMAL HIGH (ref 0–149)
VLDL CHOLESTEROL CAL: 32 mg/dL (ref 5–40)

## 2017-08-26 LAB — TSH: TSH: 8.53 u[IU]/mL — ABNORMAL HIGH (ref 0.450–4.500)

## 2017-08-26 MED ORDER — LEVOTHYROXINE SODIUM 88 MCG PO TABS: 88.0000 ug | ORAL_TABLET | Freq: Every day | ORAL | 5 refills | Status: DC

## 2017-09-13 ENCOUNTER — Inpatient Hospital Stay: Payer: Medicare Other

## 2017-09-13 ENCOUNTER — Ambulatory Visit
Admission: RE | Admit: 2017-09-13 | Discharge: 2017-09-13 | Disposition: A | Discharge status: Home / Self Care | Payer: Medicare Other | Source: Ambulatory Visit | Attending: Internal Medicine | Admitting: Internal Medicine

## 2017-09-13 ENCOUNTER — Encounter: Payer: Self-pay | Admitting: Internal Medicine

## 2017-09-13 ENCOUNTER — Other Ambulatory Visit: Payer: Self-pay

## 2017-09-13 ENCOUNTER — Inpatient Hospital Stay: Payer: Medicare Other | Attending: Internal Medicine | Admitting: Internal Medicine

## 2017-09-13 ENCOUNTER — Encounter: Payer: Self-pay | Admitting: *Deleted

## 2017-09-13 VITALS — BP 111/71 | HR 75 | Temp 97.6°F | Resp 22 | Ht 65.0 in | Wt 159.6 lb

## 2017-09-13 DIAGNOSIS — Z923 Personal history of irradiation: Secondary | ICD-10-CM | POA: Diagnosis not present

## 2017-09-13 DIAGNOSIS — J9610 Chronic respiratory failure, unspecified whether with hypoxia or hypercapnia: Secondary | ICD-10-CM | POA: Insufficient documentation

## 2017-09-13 DIAGNOSIS — I4891 Unspecified atrial fibrillation: Secondary | ICD-10-CM | POA: Insufficient documentation

## 2017-09-13 DIAGNOSIS — M791 Myalgia, unspecified site: Secondary | ICD-10-CM | POA: Diagnosis not present

## 2017-09-13 DIAGNOSIS — J90 Pleural effusion, not elsewhere classified: Secondary | ICD-10-CM | POA: Insufficient documentation

## 2017-09-13 DIAGNOSIS — C3401 Malignant neoplasm of right main bronchus: Secondary | ICD-10-CM | POA: Insufficient documentation

## 2017-09-13 DIAGNOSIS — Z9221 Personal history of antineoplastic chemotherapy: Secondary | ICD-10-CM | POA: Diagnosis not present

## 2017-09-13 DIAGNOSIS — Z952 Presence of prosthetic heart valve: Secondary | ICD-10-CM | POA: Diagnosis not present

## 2017-09-13 DIAGNOSIS — J984 Other disorders of lung: Secondary | ICD-10-CM | POA: Diagnosis not present

## 2017-09-13 DIAGNOSIS — R0602 Shortness of breath: Secondary | ICD-10-CM | POA: Insufficient documentation

## 2017-09-13 DIAGNOSIS — Z9981 Dependence on supplemental oxygen: Secondary | ICD-10-CM | POA: Insufficient documentation

## 2017-09-13 DIAGNOSIS — Z7901 Long term (current) use of anticoagulants: Secondary | ICD-10-CM | POA: Insufficient documentation

## 2017-09-13 DIAGNOSIS — N183 Chronic kidney disease, stage 3 (moderate): Secondary | ICD-10-CM | POA: Insufficient documentation

## 2017-09-13 DIAGNOSIS — J449 Chronic obstructive pulmonary disease, unspecified: Secondary | ICD-10-CM | POA: Insufficient documentation

## 2017-09-13 DIAGNOSIS — I509 Heart failure, unspecified: Secondary | ICD-10-CM | POA: Insufficient documentation

## 2017-09-13 DIAGNOSIS — Z85118 Personal history of other malignant neoplasm of bronchus and lung: Secondary | ICD-10-CM

## 2017-09-13 LAB — CBC WITH DIFFERENTIAL/PLATELET
BASOS ABS: 0 10*3/uL (ref 0–0.1)
BASOS PCT: 1 %
EOS ABS: 0.6 10*3/uL (ref 0–0.7)
Eosinophils Relative: 12 %
HCT: 39.9 % (ref 35.0–47.0)
HEMOGLOBIN: 13.6 g/dL (ref 12.0–16.0)
Lymphocytes Relative: 17 %
Lymphs Abs: 0.9 10*3/uL — ABNORMAL LOW (ref 1.0–3.6)
MCH: 30.7 pg (ref 26.0–34.0)
MCHC: 34 g/dL (ref 32.0–36.0)
MCV: 90.5 fL (ref 80.0–100.0)
MONO ABS: 0.7 10*3/uL (ref 0.2–0.9)
Monocytes Relative: 15 %
NEUTROS ABS: 2.7 10*3/uL (ref 1.4–6.5)
NEUTROS PCT: 55 %
Platelets: 236 10*3/uL (ref 150–440)
RBC: 4.41 MIL/uL (ref 3.80–5.20)
RDW: 15.5 % — AB (ref 11.5–14.5)
WBC: 5 10*3/uL (ref 3.6–11.0)

## 2017-09-13 LAB — COMPREHENSIVE METABOLIC PANEL WITH GFR
ALT: 18 U/L (ref 14–54)
AST: 37 U/L (ref 15–41)
Albumin: 3.9 g/dL (ref 3.5–5.0)
Alkaline Phosphatase: 133 U/L — ABNORMAL HIGH (ref 38–126)
Anion gap: 10 (ref 5–15)
BUN: 21 mg/dL — ABNORMAL HIGH (ref 6–20)
CO2: 32 mmol/L (ref 22–32)
Calcium: 9.1 mg/dL (ref 8.9–10.3)
Chloride: 93 mmol/L — ABNORMAL LOW (ref 101–111)
Creatinine, Ser: 1.31 mg/dL — ABNORMAL HIGH (ref 0.44–1.00)
GFR calc Af Amer: 44 mL/min — ABNORMAL LOW
GFR calc non Af Amer: 38 mL/min — ABNORMAL LOW
Glucose, Bld: 104 mg/dL — ABNORMAL HIGH (ref 65–99)
Potassium: 4 mmol/L (ref 3.5–5.1)
Sodium: 135 mmol/L (ref 135–145)
Total Bilirubin: 0.8 mg/dL (ref 0.3–1.2)
Total Protein: 8.5 g/dL — ABNORMAL HIGH (ref 6.5–8.1)

## 2017-09-13 LAB — MAGNESIUM: MAGNESIUM: 2 mg/dL (ref 1.7–2.4)

## 2017-09-13 NOTE — Progress Notes (Signed)
[facility] OFFICE PROGRESS NOTE  Patient Care Team: Glean Hess, MD as PCP - General (Internal Medicine) Minna Merritts, MD as Consulting Physician (Cardiology) Cammie Sickle, MD as Consulting Physician (Internal Medicine) Alisa Graff, FNP as Nurse Practitioner (Family Medicine) Flora Lipps, MD as Consulting Physician (Pulmonary Disease) Marvia Pickles, MD as Consulting Physician (Cardiology)  Cancer Staging No matching staging information was found for the patient.    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 Biopsy from hilar area and lymph node station 4R was positive for squamous cell carcinoma.  Patient had compression of the right mainstem bronchus because of enlarged lymph node and a mass; [ T4 N1 M0 tumor stage IIIa ].  2. Started on radiation and chemotherapy from October 21, 2014 3. Finished 6 cycles of carboplatinum and Taxol  in March  29 th of 2016,  PET scan shows significant response  4. Started on consolidation chemotherapy.  Patient finished 2 cycles on July 2016 of carboplatin and Taxol. 5. Atrial fibrillation diagnosis in August of 2016 on eloquis 6. Repeat bronchoscopy was negative for any malignancy  In February 2017.  # SEP 7th 2017- CT [facility]- 5cm hilar mass; bil Ground glass opacities.   # Radiation Pneumonitis [Dr.Mungal] on Prednisone  # May 1st 2018- F one- No targettable mutations; Int-TMB; ? 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  female pleasant patient above history of Stage III squamous cell lung cancer status post chemoradiation [finished treatment July 2016]; With a recurrent right-sided pleural effusion status post thoracentesis [may 2018]; without any convincing evidence of recurrence is  here for follow-up.  Patient complains of chronic shortness of breath.  She has been needing 24/7 oxygen.  Also, notes have worsening cough without hemoptysis.  Continues to have myalgias bilateral legs arms over the last few months.  Question statin   Denies any swelling in the legs.  No significant weight gain.  No hemoptysis.  Appetite is fair.   REVIEW OF SYSTEMS:  A complete 10 point review of system is done which is negative except mentioned above/history of present illness.   PAST MEDICAL HISTORY :  Past Medical History:  Diagnosis Date  . Atypical atrial flutter ([place])    a. s/p ablation 07/27/2013 followed by Dr. Rockey Situ  . CAD (coronary artery disease)    a. s/p MI x 2 in 2002 s/p PCI x 2 in 2002; b. s/p 2v CABG 2002; c. stress echo 07/2004 w/ evi of pos & inf infarct & no evi of ischemia; d. 4/08 dipyridamole scan w/ multiple areas of infarct, no ischemia, EF 49%; e. cath 04/28/15 3v CAD, med Rx rec, no targets for revasc, LM lum irregs, pLAD 30%, 100%, ost-pLCx 60%, [name] 99%, OM2 100%, p-mRCA 90%, [name] 100% L-R collats, VG-mLAD irregs, VG-OM2 oc  . Carcinoma of right lung ([place]) 01/03/2015   a. followed by Dr. Oliva Bustard  . Chronic systolic CHF (congestive heart failure) ([place])    a. echo 03/2015: EF 30-35%, sev ant/inf/pos HK, in mild to mod MR  . Complication of anesthesia    more recently patient oxygen levels do not rebound as quickly  . Compressed spine fracture ([place]) 08/06/2016  lumbar 2, t11, t12  . COPD (chronic obstructive pulmonary disease) ([place])   . Fractured pelvis ([place]) 05/26/2016   2 places  . GERD (gastroesophageal reflux disease)   . History of blood clots    12/2001  . History of colonoscopy 2013  . History of mammography, screening 2015  . History of Papanicolaou smear of cervix 2013  . HLD (hyperlipidemia)   . HTN (hypertension)   . Hypothyroidism   . Lung cancer ([place])   . Mitral regurgitation    a. s/p mitral ring placement 09/2000; b. echo 09/2010: EF 50%,  inf HK, post AK, mild MR, prosthetic mitral valve ring w/ peak gradient of 10 mmHg; b. echo 2/13: EF 50%, mild MR/TR     . Myocardial infarction ([place])    X 2 (LAST ONE IN 2002)  . Neuropathy   . Pacemaker    a. MDT 2002; b. generator replacement 2013; c. followed by Dr. Omelia Blackwater, MD  . PAF (paroxysmal atrial fibrillation) ([place])    a. on Eliquis   . Personal history of chemotherapy   . Personal history of radiation therapy     PAST SURGICAL HISTORY :   Past Surgical History:  Procedure Laterality Date  . ABLATION  04/2016   [facility]  . APPENDECTOMY    . CARDIAC CATHETERIZATION N/A 04/28/2015   Procedure: Left Heart Cath and Coronary Angiography;  Surgeon: Wellington Hampshire, MD;  Location: [facility];  Service: Cardiovascular;  Laterality: N/A;  . CARDIOVERSION N/A 04/08/2017   Procedure: CARDIOVERSION;  Surgeon: Wellington Hampshire, MD;  Location: [facility];  Service: Cardiovascular;  Laterality: N/A;  . COLONOSCOPY  12/2009   2 small tubular adenomas  . CORONARY ARTERY BYPASS GRAFT  09/2000  . ECTOPIC PREGNANCY SURGERY    . ELECTROMAGNETIC NAVIGATION BROCHOSCOPY N/A 10/06/2015   Procedure: ELECTROMAGNETIC NAVIGATION BRONCHOSCOPY;  Surgeon: Flora Lipps, MD;  Location: [facility];  Service: Cardiopulmonary;  Laterality: N/A;  . ENDOBRONCHIAL ULTRASOUND N/A 10/06/2015   Procedure: ENDOBRONCHIAL ULTRASOUND;  Surgeon: Flora Lipps, MD;  Location: [facility];  Service: Cardiopulmonary;  Laterality: N/A;  . KYPHOPLASTY N/A 09/28/2016   Procedure: KYPHOPLASTY;  Surgeon: Hessie Knows, MD;  Location: [facility];  Service: Orthopedics;  Laterality: N/A;  . PACEMAKER INSERTION  03/2012  . thorocentesis  12/24/2016  . VAGINAL HYSTERECTOMY     partial - left ovary remains    FAMILY HISTORY :   Family History  Problem Relation Age of Onset  . COPD Mother        sister, and brother  . Healthy Father   . Stroke Maternal Grandmother   . Hypertension Sister   . COPD Sister   . Hypertension Brother   .  COPD Brother   . Colon cancer Brother   . Heart attack Neg Hx   . Breast cancer Neg Hx     SOCIAL HISTORY:   Social History   Tobacco Use  . Smoking status: Former Smoker    Packs/day: 1.00    Years: 40.00    Pack years: 40.00    Types: Cigarettes    Last attempt to quit: 08/30/2000    Years since quitting: 17.0  . Smokeless tobacco: Never Used  . Tobacco comment: quit smoking in 08/28/2000. Smoking cessation materials not required  Substance Use Topics  . Alcohol use: Yes    Alcohol/week: 6.0 oz    Types: 10 Glasses of wine per week    Comment: 2 glasses of wine per day  .  Drug use: No    ALLERGIES:  is allergic to lovenox [enoxaparin sodium] and meperidine.  MEDICATIONS:  Current Outpatient Medications  Medication Sig Dispense Refill  . acetaminophen (TYLENOL) 500 MG tablet Take 1,000 mg by mouth every 6 (six) hours as needed.     Marland Kitchen amiodarone (PACERONE) 200 MG tablet Take 200 mg by mouth daily.     Marland Kitchen apixaban (ELIQUIS) 5 MG TABS tablet Take 5 mg by mouth 2 (two) times daily.     Marland Kitchen atorvastatin (LIPITOR) 20 MG tablet TAKE 1 TABLET (20 MG TOTAL) BY MOUTH AT BEDTIME. 30 tablet 5  . Calcium Carb-Cholecalciferol (CALCIUM 600-D PO) Take 1 tablet by mouth every morning.     . Cetirizine HCl 10 MG CAPS Take 1 capsule (10 mg total) by mouth 1 day or 1 dose. (Patient taking differently: Take 1 capsule by mouth daily. ) 30 capsule 5  . ENTRESTO 24-26 MG TAKE 1 TABLET BY MOUTH TWICE A DAY 60 tablet 6  . furosemide (LASIX) 40 MG tablet Take 1 tablet (40 mg total) by mouth 2 (two) times daily. (Patient taking differently: Take 40 mg by mouth 2 (two) times daily. Pt takes bid except she does not take any on Sunday.) 60 tablet 6  . levothyroxine (SYNTHROID, LEVOTHROID) 88 MCG tablet Take 1 tablet (88 mcg total) by mouth daily before breakfast. 30 tablet 5  . metoprolol succinate (TOPROL-XL) 25 MG 24 hr tablet TAKE 1 TABLET BY MOUTH EVERY MORNING AND 2 TABS IN THE EVENING 90 tablet 3  .  OXYGEN Inhale 2 L into the lungs.     . senna (SENOKOT) 8.6 MG TABS tablet Take 1 tablet (8.6 mg total) by mouth daily. (Patient taking differently: Take 1 tablet by mouth at bedtime as needed. ) 30 each 0  . umeclidinium bromide (INCRUSE ELLIPTA) 62.5 MCG/INH AEPB Inhale 1 puff into the lungs daily. 1 each 0  . fluticasone furoate-vilanterol (BREO ELLIPTA) 200-25 MCG/INH AEPB Inhale 1 puff into the lungs daily.    Marland Kitchen ipratropium-albuterol (DUONEB) 0.5-2.5 (3) MG/3ML SOLN Take 3 mLs by nebulization every 6 (six) hours as needed.     No current facility-administered medications for this visit.    Facility-Administered Medications Ordered in Other Visits  Medication Dose Route Frequency Provider Last Rate Last Dose  . sodium chloride 0.9 % injection 10 mL  10 mL Intravenous PRN Forest Gleason, MD   10 mL at 02/19/15 1000  . sodium chloride flush (NS) 0.9 % injection 10 mL  10 mL Intravenous PRN Cammie Sickle, MD   10 mL at 02/16/16 1048    PHYSICAL EXAMINATION: ECOG PERFORMANCE STATUS: 0 - Asymptomatic  BP 111/71 (BP Location: Left Arm, Patient Position: Sitting)   Pulse 75   Temp 97.6 F (36.4 C) (Tympanic)   Resp (!) 22   Ht _0  (1.651 m)   Wt 159 lb 9.8 oz (72.4 kg)   SpO2 (!) 89% Comment: 2L  BMI 26.56 kg/m   Filed Weights   09/13/17 1123  Weight: 159 lb 9.8 oz (72.4 kg)    GENERAL: Well-nourished well-developed; Alert, no distress and comfortable. She is accompanied by her son.  She is wearing nasal cannula oxygen. EYES: no pallor or icterus OROPHARYNX: no thrush. no ulceration; good dentition  NECK: supple, no masses felt LYMPH:  no palpable lymphadenopathy in the cervical, axillary or inguinal regions LUNGS: Decreased breath sounds to auscultation right more than left.  No wheeze or crackles HEART/CVS: regular rate &  rhythm and no murmurs; No lower extremity edema ABDOMEN:abdomen soft, non-tender and normal bowel sounds Musculoskeletal:no cyanosis of digits and  no clubbing  PSYCH: alert & oriented x 3 with fluent speech NEURO: no focal motor/sensory deficits SKIN:  no rashes or significant lesions  LABORATORY DATA:  I have reviewed the data as listed    Component Value Date/Time   NA 135 09/13/2017 1051   NA 140 04/22/2017 1101   NA 136 12/24/2014 1457   K 4.0 09/13/2017 1051   K 3.6 12/24/2014 1457   CL 93 (L) 09/13/2017 1051   CL 98 (L) 12/24/2014 1457   CO2 32 09/13/2017 1051   CO2 32 12/24/2014 1457   GLUCOSE 104 (H) 09/13/2017 1051   GLUCOSE 107 (H) 12/24/2014 1457   BUN 21 (H) 09/13/2017 1051   BUN 21 04/22/2017 1101   BUN 17 12/24/2014 1457   CREATININE 1.31 (H) 09/13/2017 1051   CREATININE 1.00 12/24/2014 1457   CALCIUM 9.1 09/13/2017 1051   CALCIUM 9.5 12/24/2014 1457   PROT 8.5 (H) 09/13/2017 1051   PROT 7.4 12/24/2014 1457   ALBUMIN 3.9 09/13/2017 1051   ALBUMIN 3.9 12/24/2014 1457   AST 37 09/13/2017 1051   AST 22 12/24/2014 1457   ALT 18 09/13/2017 1051   ALT 19 12/24/2014 1457   ALKPHOS 133 (H) 09/13/2017 1051   ALKPHOS 65 12/24/2014 1457   BILITOT 0.8 09/13/2017 1051   BILITOT 0.4 12/24/2014 1457   GFRNONAA 38 (L) 09/13/2017 1051   GFRNONAA 55 (L) 12/24/2014 1457   GFRAA 44 (L) 09/13/2017 1051   GFRAA >60 12/24/2014 1457    No results found for: SPEP, UPEP  Lab Results  Component Value Date   WBC 5.0 09/13/2017   NEUTROABS 2.7 09/13/2017   HGB 13.6 09/13/2017   HCT 39.9 09/13/2017   MCV 90.5 09/13/2017   PLT 236 09/13/2017      Chemistry      Component Value Date/Time   NA 135 09/13/2017 1051   NA 140 04/22/2017 1101   NA 136 12/24/2014 1457   K 4.0 09/13/2017 1051   K 3.6 12/24/2014 1457   CL 93 (L) 09/13/2017 1051   CL 98 (L) 12/24/2014 1457   CO2 32 09/13/2017 1051   CO2 32 12/24/2014 1457   BUN 21 (H) 09/13/2017 1051   BUN 21 04/22/2017 1101   BUN 17 12/24/2014 1457   CREATININE 1.31 (H) 09/13/2017 1051   CREATININE 1.00 12/24/2014 1457      Component Value Date/Time   CALCIUM  9.1 09/13/2017 1051   CALCIUM 9.5 12/24/2014 1457   ALKPHOS 133 (H) 09/13/2017 1051   ALKPHOS 65 12/24/2014 1457   AST 37 09/13/2017 1051   AST 22 12/24/2014 1457   ALT 18 09/13/2017 1051   ALT 19 12/24/2014 1457   BILITOT 0.8 09/13/2017 1051   BILITOT 0.4 12/24/2014 1457     IMPRESSION: 1. Stable. Post treatment changes in the right hilum and parahilar right lung. No new or progressive findings on today's study. 2. Small to moderate right pleural effusion. 3. Coronary artery and Aortic Atherosclerois (ICD10-170.0)   Electronically Signed   By: Misty Stanley M.D.   On: 07/08/2017 15:29  RADIOGRAPHIC STUDIES: I have personally reviewed the radiological images as listed and agreed with the findings in the report. No results found.   ASSESSMENT & PLAN:  Cancer of hilus of right lung ([place]) Stage III squamous cell lung cancer s/p chemo-RT- 2016. Status post bronchoscopy in  February 2017-negative for malignancy;- CT Nov 2018- no clinical evidence of recurrence.CT- shows no significant concerns for recurrent malignancy; shows radiation changes; pleural effusion [moderate-C discussion below].   # Clinically no evidence of progression at this time; based on the CT scan.  Will plan to continue monitoring with CT imaging every 4-6 months.  #Chronic respiratory failure-Home O2 2 L; multifactorial -COPD/CHF/mRight pleural effusion [may 2018]-fairly stable.  Question worsening shortness of breath; check chest x-ray  # Muscle pain- ? Statin vs electrolyte abo- check Mag.   # CKD-III creatinine 1.37 stable. sec to diuretics. Monitor for now.   # Follow-up in 3 months; CXR;labs; CT scan prior.  Cc; Dr.Kasa; Dr.Gollan.    Orders Placed This Encounter  Procedures  . DG Chest 2 View    Standing Status:   Future    Number of Occurrences:   1    Standing Expiration Date:   11/13/2018    Order Specific Question:   Reason for Exam (SYMPTOM  OR DIAGNOSIS REQUIRED)    Answer:   shortness of  breath    Order Specific Question:   Preferred imaging location?    Answer:   [facility]  . CT CHEST WO CONTRAST    Standing Status:   Future    Standing Expiration Date:   09/13/2018    Scheduling Instructions:     1-2 days prior to next visit in 3 months- Dr.B    Order Specific Question:   Preferred imaging location?    Answer:   [facility]    Order Specific Question:   Radiology Contrast Protocol - do NOT remove file path    Answer:   [email or web address]  . Magnesium    Standing Status:   Future    Number of Occurrences:   1    Standing Expiration Date:   09/13/2018   All questions were answered. The patient knows to call the clinic with any problems, questions or concerns.      Cammie Sickle, MD 09/20/2017 1:57 PM

## 2017-09-13 NOTE — Assessment & Plan Note (Addendum)
Stage III squamous cell lung cancer s/p chemo-RT- 2016. Status post bronchoscopy in February 2017-negative for malignancy;- CT Nov 2018- no clinical evidence of recurrence.CT- shows no significant concerns for recurrent malignancy; shows radiation changes; pleural effusion [moderate-C discussion below].   # Clinically no evidence of progression at this time; based on the CT scan.  Will plan to continue monitoring with CT imaging every 4-6 months.  #Chronic respiratory failure-Home O2 2 L; multifactorial -COPD/CHF/mRight pleural effusion [may 2018]-fairly stable.  Question worsening shortness of breath; check chest x-ray  # Muscle pain- ? Statin vs electrolyte abo- check Mag.  Recommend holding statin for approximately 1 month; and restart if no symptomatic improvement noted.  Also recommend informing PCP  # CKD-III creatinine 1.37 stable. sec to diuretics. Monitor for now.   # Follow-up in 3 months; CXR;labs; CT scan prior.  Addendum chest x-ray: No acute process.   Cc; Dr.Kasa; Dr.Gollan.

## 2017-09-14 NOTE — Progress Notes (Signed)
Patient ID: Jenny Cooper, female    DOB: April 14, 1936, 82 y.o.   MRN: [id number]  HPI  Ms Moroz is a 82 y/o female with a history of atrial fibrillation (cardioversion), MI, lung cancer, hypothyroidism, HTN, hyperlipidemia, blood clots, GERD, COPD, spinal compression, previous tobacco use and chronic heart failure.   Echo done 04/06/17 reviewed and shows an EF of 30-35% along with trivial AR, moderate MR and moderately elevated PA pressure of 50 mm Hg. Cardiac catheterization done 04/28/15 shows severe underlying three-vessel CAD with occluded SVG likely to OM. No good options for revascularization. Native RCA is occluded with left-to-right collaterals. Recommended optimizing medical therapy.   Admitted 04/06/17 due to pulmonary edema. Initially needed bipap and then transitioned to nasal cannula. Given IV diuretics initially and then transitioned to oral diuretics. Cardiology consult obtained. Cardioverted and maintained in NSR. Discharged home after 2 days.   She presents today for a follow-up visit with a chief complaint of minimal shortness of breath upon moderate exertion. She says this has been present for several years with varying levels of severity. She has associated fatigue, head congestion and muscle aches/pain in thighs. She denies any chest pain, edema, abdominal distention, dizziness, difficulty sleeping or weight gain.   Past Medical History:  Diagnosis Date  . Atypical atrial flutter ([place])    a. s/p ablation 07/27/2013 followed by Dr. Rockey Situ  . CAD (coronary artery disease)    a. s/p MI x 2 in 2002 s/p PCI x 2 in 2002; b. s/p 2v CABG 2002; c. stress echo 07/2004 w/ evi of pos & inf infarct & no evi of ischemia; d. 4/08 dipyridamole scan w/ multiple areas of infarct, no ischemia, EF 49%; e. cath 04/28/15 3v CAD, med Rx rec, no targets for revasc, LM lum irregs, pLAD 30%, 100%, ost-pLCx 60%, [name] 99%, OM2 100%, p-mRCA 90%, [name] 100% L-R collats, VG-mLAD irregs, VG-OM2 oc  . Carcinoma  of right lung ([place]) 01/03/2015   a. followed by Dr. Oliva Bustard  . Chronic systolic CHF (congestive heart failure) ([place])    a. echo 03/2015: EF 30-35%, sev ant/inf/pos HK, in mild to mod MR  . Complication of anesthesia    more recently patient oxygen levels do not rebound as quickly  . Compressed spine fracture ([place]) 08/06/2016   lumbar 2, t11, t12  . COPD (chronic obstructive pulmonary disease) ([place])   . Fractured pelvis ([place]) 05/26/2016   2 places  . GERD (gastroesophageal reflux disease)   . History of blood clots    12/2001  . History of colonoscopy 2013  . History of mammography, screening 2015  . History of Papanicolaou smear of cervix 2013  . HLD (hyperlipidemia)   . HTN (hypertension)   . Hypothyroidism   . Lung cancer ([place])   . Mitral regurgitation    a. s/p mitral ring placement 09/2000; b. echo 09/2010: EF 50%, inf HK, post AK, mild MR, prosthetic mitral valve ring w/ peak gradient of 10 mmHg; b. echo 2/13: EF 50%, mild MR/TR     . Myocardial infarction ([place])    X 2 (LAST ONE IN 2002)  . Neuropathy   . Pacemaker    a. MDT 2002; b. generator replacement 2013; c. followed by Dr. Omelia Blackwater, MD  . PAF (paroxysmal atrial fibrillation) ([place])    a. on Eliquis   . Personal history of chemotherapy   . Personal history of radiation therapy    Past Surgical History:  Procedure Laterality Date  . ABLATION  04/2016   [facility]  . APPENDECTOMY    . CARDIAC CATHETERIZATION N/A 04/28/2015   Procedure: Left Heart Cath and Coronary Angiography;  Surgeon: Wellington Hampshire, MD;  Location: [facility];  Service: Cardiovascular;  Laterality: N/A;  . CARDIOVERSION N/A 04/08/2017   Procedure: CARDIOVERSION;  Surgeon: Wellington Hampshire, MD;  Location: [facility];  Service: Cardiovascular;  Laterality: N/A;  . COLONOSCOPY  12/2009   2 small tubular adenomas  . CORONARY ARTERY BYPASS GRAFT  09/2000  . ECTOPIC PREGNANCY SURGERY    . ELECTROMAGNETIC NAVIGATION BROCHOSCOPY N/A 10/06/2015   Procedure:  ELECTROMAGNETIC NAVIGATION BRONCHOSCOPY;  Surgeon: Flora Lipps, MD;  Location: [facility];  Service: Cardiopulmonary;  Laterality: N/A;  . ENDOBRONCHIAL ULTRASOUND N/A 10/06/2015   Procedure: ENDOBRONCHIAL ULTRASOUND;  Surgeon: Flora Lipps, MD;  Location: [facility];  Service: Cardiopulmonary;  Laterality: N/A;  . KYPHOPLASTY N/A 09/28/2016   Procedure: KYPHOPLASTY;  Surgeon: Hessie Knows, MD;  Location: [facility];  Service: Orthopedics;  Laterality: N/A;  . PACEMAKER INSERTION  03/2012  . thorocentesis  12/24/2016  . VAGINAL HYSTERECTOMY     partial - left ovary remains   Family History  Problem Relation Age of Onset  . COPD Mother        sister, and brother  . Healthy Father   . Stroke Maternal Grandmother   . Hypertension Sister   . COPD Sister   . Hypertension Brother   . COPD Brother   . Colon cancer Brother   . Heart attack Neg Hx   . Breast cancer Neg Hx    Social History   Tobacco Use  . Smoking status: Former Smoker    Packs/day: 1.00    Years: 40.00    Pack years: 40.00    Types: Cigarettes    Last attempt to quit: 08/30/2000    Years since quitting: 17.0  . Smokeless tobacco: Never Used  . Tobacco comment: quit smoking in 08/28/2000. Smoking cessation materials not required  Substance Use Topics  . Alcohol use: Yes    Alcohol/week: 6.0 oz    Types: 10 Glasses of wine per week    Comment: 2 glasses of wine per day   Allergies  Allergen Reactions  . Lovenox [Enoxaparin Sodium] Itching  . Meperidine Other (See Comments)    Other Reaction: pt does not like how it makes her feel   Prior to Admission medications   Medication Sig Start Date End Date Taking? Authorizing Provider  acetaminophen (TYLENOL) 500 MG tablet Take 1,000 mg by mouth every 6 (six) hours as needed.    Yes [provider]  amiodarone (PACERONE) 200 MG tablet Take 200 mg by mouth daily.    Yes [provider]  apixaban (ELIQUIS) 5 MG TABS tablet Take 5 mg by mouth 2 (two) times daily.   08/04/16  Yes [provider]  atorvastatin (LIPITOR) 20 MG tablet TAKE 1 TABLET (20 MG TOTAL) BY MOUTH AT BEDTIME. 04/19/17  Yes Glean Hess, MD  Calcium Carb-Cholecalciferol (CALCIUM 600-D PO) Take 1 tablet by mouth every morning.    Yes [provider]  Cetirizine HCl 10 MG CAPS Take 1 capsule (10 mg total) by mouth 1 day or 1 dose. Patient taking differently: Take 1 capsule by mouth daily.  05/19/15  Yes Dunn, Ryan M, PA-C  ENTRESTO 24-26 MG TAKE 1 TABLET BY MOUTH TWICE A DAY 08/08/17  Yes Gollan, Kathlene November, MD  fluticasone furoate-vilanterol (BREO ELLIPTA) 200-25 MCG/INH AEPB Inhale 1 puff  into the lungs daily.   Yes [provider]  furosemide (LASIX) 40 MG tablet Take 1 tablet (40 mg total) by mouth 2 (two) times daily. Patient taking differently: Take 40 mg by mouth 2 (two) times daily. Pt takes bid except she does not take any on Sunday. 05/03/17  Yes Gollan, Kathlene November, MD  ipratropium-albuterol (DUONEB) 0.5-2.5 (3) MG/3ML SOLN Take 3 mLs by nebulization every 6 (six) hours as needed.   Yes [provider]  levothyroxine (SYNTHROID, LEVOTHROID) 88 MCG tablet Take 1 tablet (88 mcg total) by mouth daily before breakfast. 08/26/17  Yes Glean Hess, MD  metoprolol succinate (TOPROL-XL) 25 MG 24 hr tablet TAKE 1 TABLET BY MOUTH EVERY MORNING AND 2 TABS IN THE EVENING 08/03/17  Yes Gollan, Kathlene November, MD  OXYGEN Inhale 2 L into the lungs.    Yes [provider]  senna (SENOKOT) 8.6 MG TABS tablet Take 1 tablet (8.6 mg total) by mouth daily. Patient taking differently: Take 1 tablet by mouth at bedtime as needed.  05/27/16  Yes Veronese, [place], MD  umeclidinium bromide (INCRUSE ELLIPTA) 62.5 MCG/INH AEPB Inhale 1 puff into the lungs daily. 02/09/17  Yes Flora Lipps, MD    Review of Systems  Constitutional: Positive for fatigue. Negative for appetite change.  HENT: Positive for congestion. Negative for rhinorrhea and sore throat.   Eyes:  Negative.   Respiratory: Positive for shortness of breath. Negative for cough and chest tightness.   Cardiovascular: Negative for chest pain, palpitations and leg swelling.  Gastrointestinal: Negative for abdominal distention and abdominal pain.  Endocrine: Negative.   Genitourinary: Negative.   Musculoskeletal: Positive for myalgias (bilateral thighs at nighttime). Negative for back pain and neck pain.  Skin: Negative.   Allergic/Immunologic: Negative.   Neurological: Negative for dizziness and light-headedness.  Hematological: Negative for adenopathy. Does not bruise/bleed easily.  Psychiatric/Behavioral: Negative for dysphoric mood and sleep disturbance. The patient is not nervous/anxious.    Vitals:   09/15/17 1019  BP: (!) 92/59  Pulse: 62  Resp: 18  SpO2: 95%  Weight: 162 lb 2 oz (73.5 kg)  Height: 5\' 6"  (1.676 m)   Wt Readings from Last 3 Encounters:  09/15/17 162 lb 2 oz (73.5 kg)  09/13/17 159 lb 9.8 oz (72.4 kg)  08/25/17 164 lb (74.4 kg)   Lab Results  Component Value Date   CREATININE 1.31 (H) 09/13/2017   CREATININE 1.37 (H) 07/08/2017   CREATININE 1.20 (H) 04/22/2017    Physical Exam  Constitutional: She is oriented to person, place, and time. She appears well-developed and well-nourished.  HENT:  Head: Normocephalic and atraumatic.  Neck: Normal range of motion. Neck supple. No JVD present.  Cardiovascular: Regular rhythm. Bradycardia present.  Pulmonary/Chest: Effort normal. She has no wheezes. She has no rales.  Abdominal: Soft. She exhibits no distension. There is no tenderness.  Musculoskeletal: She exhibits no edema or tenderness.  Neurological: She is alert and oriented to person, place, and time.  Skin: Skin is warm and dry.  Psychiatric: She has a normal mood and affect. Her behavior is normal. Thought content normal.  Nursing note and vitals reviewed.  Assessment & Plan:   1: Chronic heart failure with reduced ejection fraction- - NYHA class  II - euvolemic today - weighing daily and she was instructed to call for an overnight weight gain of >2 pounds or a weekly weight gain of >5 pounds - not adding any salt to her food; reminded to closely follow  a 2000mg  sodium diet - unable to titrate entresto due to hypotension - unable to titrate metoprolol succinate due to bradycardia - Saw cardiologist Rockey Situ) 04/22/17 - BNP from 04/06/17 was 1022.0 - patient reports receiving her flu/pneumonia vaccines for the season - PharmD reconciled medications with the patient  2: Atrial fibrillation- - remains in NSR - on amiodarone, apixaban  3: HTN- - BP continues to be on low side but without dizziness - saw PCP Army Melia) 08/25/17 - BMP from 09/13/17 reviewed and showed sodium 135, potassium 4.0 and GFR 38  4: Muscle ache- - patient says this tends to occur at night making it difficult to sleep - putting ice or a weighted blanket on them tends to help - she says that she's had restless legs in the past but that this feels different from that - advised patient to hold atorvastatin for 3-4 weeks and see if symptoms improve; if they do not, then symptoms aren't related to the medication - if symptoms do get better, can rechallenge and if symptoms re-occur may need a different statin such as crestor  Patient did not bring in medication bottles, so medication list was verbally reviewed.   Return in 6 months or sooner for any questions/problems before then.

## 2017-09-15 ENCOUNTER — Other Ambulatory Visit: Payer: Self-pay

## 2017-09-15 ENCOUNTER — Encounter: Payer: Self-pay | Admitting: Family

## 2017-09-15 ENCOUNTER — Ambulatory Visit: Payer: Medicare Other | Attending: Family | Admitting: Family

## 2017-09-15 VITALS — BP 92/59 | HR 62 | Resp 18 | Ht 66.0 in | Wt 162.1 lb

## 2017-09-15 DIAGNOSIS — G629 Polyneuropathy, unspecified: Secondary | ICD-10-CM | POA: Diagnosis not present

## 2017-09-15 DIAGNOSIS — I484 Atypical atrial flutter: Secondary | ICD-10-CM | POA: Insufficient documentation

## 2017-09-15 DIAGNOSIS — Z79899 Other long term (current) drug therapy: Secondary | ICD-10-CM | POA: Diagnosis not present

## 2017-09-15 DIAGNOSIS — Z85118 Personal history of other malignant neoplasm of bronchus and lung: Secondary | ICD-10-CM | POA: Insufficient documentation

## 2017-09-15 DIAGNOSIS — K219 Gastro-esophageal reflux disease without esophagitis: Secondary | ICD-10-CM | POA: Diagnosis not present

## 2017-09-15 DIAGNOSIS — M791 Myalgia, unspecified site: Secondary | ICD-10-CM | POA: Diagnosis not present

## 2017-09-15 DIAGNOSIS — E039 Hypothyroidism, unspecified: Secondary | ICD-10-CM | POA: Insufficient documentation

## 2017-09-15 DIAGNOSIS — Z7901 Long term (current) use of anticoagulants: Secondary | ICD-10-CM | POA: Diagnosis not present

## 2017-09-15 DIAGNOSIS — I2510 Atherosclerotic heart disease of native coronary artery without angina pectoris: Secondary | ICD-10-CM | POA: Diagnosis not present

## 2017-09-15 DIAGNOSIS — Z86718 Personal history of other venous thrombosis and embolism: Secondary | ICD-10-CM | POA: Diagnosis not present

## 2017-09-15 DIAGNOSIS — Z9889 Other specified postprocedural states: Secondary | ICD-10-CM | POA: Insufficient documentation

## 2017-09-15 DIAGNOSIS — Z9221 Personal history of antineoplastic chemotherapy: Secondary | ICD-10-CM | POA: Insufficient documentation

## 2017-09-15 DIAGNOSIS — I480 Paroxysmal atrial fibrillation: Secondary | ICD-10-CM | POA: Diagnosis not present

## 2017-09-15 DIAGNOSIS — I5022 Chronic systolic (congestive) heart failure: Secondary | ICD-10-CM | POA: Insufficient documentation

## 2017-09-15 DIAGNOSIS — Z952 Presence of prosthetic heart valve: Secondary | ICD-10-CM | POA: Diagnosis not present

## 2017-09-15 DIAGNOSIS — Z950 Presence of cardiac pacemaker: Secondary | ICD-10-CM | POA: Insufficient documentation

## 2017-09-15 DIAGNOSIS — E785 Hyperlipidemia, unspecified: Secondary | ICD-10-CM | POA: Insufficient documentation

## 2017-09-15 DIAGNOSIS — Z951 Presence of aortocoronary bypass graft: Secondary | ICD-10-CM | POA: Diagnosis not present

## 2017-09-15 DIAGNOSIS — I252 Old myocardial infarction: Secondary | ICD-10-CM | POA: Insufficient documentation

## 2017-09-15 DIAGNOSIS — Z87891 Personal history of nicotine dependence: Secondary | ICD-10-CM | POA: Insufficient documentation

## 2017-09-15 DIAGNOSIS — I110 Hypertensive heart disease with heart failure: Secondary | ICD-10-CM | POA: Diagnosis not present

## 2017-09-15 DIAGNOSIS — I10 Essential (primary) hypertension: Secondary | ICD-10-CM

## 2017-09-15 DIAGNOSIS — J449 Chronic obstructive pulmonary disease, unspecified: Secondary | ICD-10-CM | POA: Insufficient documentation

## 2017-09-15 NOTE — Patient Instructions (Addendum)
Continue weighing daily and call for an overnight weight gain of > 2 pounds or a weekly weight gain of >5 pounds.  Hold atorvastatin for a few weeks and see if leg pain improves.

## 2017-10-17 NOTE — Progress Notes (Signed)
Patient ID: SHAKENDRA GRIFFETH, female   DOB: 02-Dec-1935, 82 y.o.   MRN: [id number] Cardiology Office Note  Date:  10/19/2017   ID:  Daveda, Larock 1935/11/13, MRN [id number]  PCP:  Glean Hess, MD   Chief Complaint  Patient presents with  . Other    6 month follow up. Patient c/o SOB. Meds reviewed verbally with patient.     HPI:  82 y.o. female with h/o  CAD  s/p remote history of MI in 2002  2 vessel CABG post stenting in 2002,  mitral valve repair in 2002 secondary to mitral regurgitation,  PAF s/p prior TEE/DCCV on Eliquis,  atypical atrial flutter s/p ablation on 07/27/2013  atrial fibrillation and typical atrial flutter ablation on 9/11/2017at [facility] s/p MDT PPM,   COPD,  HTN,   HLD  [facility] on 04/01/15 with increased SOB, PNA, elevated troponin.  Quit smoking in 2001 carcinoma of right lung, has completed chemotherapy  falls, and chronic back pain from collapsed vertebrae Suffered pelvic fracture after fall in September 2017 She presents today to the clinic for follow-up of her atrial fibrillation and CAD  Increasing SOB  Seems to come and go, etiology unclear Does not think it is arrhythmia but not sure Does not use her nebulizer Weight has been stable if not down, no ankle swelling or abdominal bloating, less likely fluid Denies any chest pain concerning for angina Overall feels that she is stable May need to do more leg exercises Son presents with her today  Problem on Lipitor, muscles feel better without it  Conditioning has declined in general, increasing leg weakness Does not present today with cane or walker  EKG personally reviewed by myself on todays visit Shows atrial paced rhythm rate 63 bpm, narrow complex  Other past medical history reviewed On her last clinic visit she was in atrial flutter with rapid rate Started on amiodarone after discussion with [facility] EP She had worsening shortness of breath, presented to the emergency room Admitted to  the hospital with Cardioversion, 04/08/17 Lasix IV for management of her CHF Compliant with her eliquis  Echocardiogram showing EF 30 to 35%, moderately elevated RVSP  Cardioversion at the end of May 2018 for atrial flutter Since that time she has felt relatively well, recent vacation in [place] the past week Reports that she did not feel right at times but unable to put her finger on close going on  Previously on prednisone for shortness of breath, managed by pulmonary  CT scan September 2016 showing improvement of her lung cancer She finished her cancer treatment over the summer 2016, she had chemotherapy and radiation  TEE/DCCV in 2013 for her PAF. In 2014 she was diagnosed with atypical atrial flutter and underwent ablation. underwent PPM generator change in 2013.   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  finished concurrent chemoradiation with carboplatinum and Taxol and PET scan showed significant response.   [facility]. She complained of increase SOB, cough that was productive of green to yellow sputum, nausea, and vomiting.  required BiPAP for a short time upon her arrival. CXR showed bibasilar airspace disease right greater than left has progressed significantly from the prior study, worrisome for recurrent carcinoma however pneumonia could also have this appearance. CT chest has been ordered.   Troponin was found to be 0.48-->1.83.  Echo showed EF 30-35%, severe anterior and infero/posterior wall HK. Left ventricular function parameters were normal,  mild to moderate MR. Left atrium was mildly dilated. RV systolic function was normal. Mild to moderate TR. PASP was moderately to severely elevated at 60 mm Hg.   outpatient cardiac cath on 04/28/2015 that showed 3 vessel CAD with patent SVG to LAD, occluded SVG to OM, native RCA had severe ISR in the mid-segment and was occluded distally at the sites of previously placed  stents. There were left to right collaterals. Moderately reduced LVSF with EF of 35-40%. Mildly elevated LVEDP. There were no good targets for revascularization. medical therapy.   cardiac event monitor to evaluate her Afib burden that showed 50% Afib burden with a peak heart rate of 116, mostly rate controlled. Given this finding her Toprol was titrated up on 9/6 to 50 mg bid.   PMH:   has a past medical history of Atypical atrial flutter ([place]), CAD (coronary artery disease), Carcinoma of right lung ([place]) (01/30/7857), Chronic systolic CHF (congestive heart failure) ([place]), Complication of anesthesia, Compressed spine fracture ([place]) (08/06/2016), COPD (chronic obstructive pulmonary disease) ([place]), Fractured pelvis ([place]) (05/26/2016), GERD (gastroesophageal reflux disease), History of blood clots, History of colonoscopy (2013), History of mammography, screening (2015), History of Papanicolaou smear of cervix (2013), HLD (hyperlipidemia), HTN (hypertension), Hypothyroidism, Lung cancer ([place]), Mitral regurgitation, Myocardial infarction ([place]), Neuropathy, Pacemaker, PAF (paroxysmal atrial fibrillation) ([place]), Personal history of chemotherapy, and Personal history of radiation therapy.  PSH:    Past Surgical History:  Procedure Laterality Date  . ABLATION  04/2016   [facility]  . APPENDECTOMY    . CARDIAC CATHETERIZATION N/A 04/28/2015   Procedure: Left Heart Cath and Coronary Angiography;  Surgeon: Wellington Hampshire, MD;  Location: [facility];  Service: Cardiovascular;  Laterality: N/A;  . CARDIOVERSION N/A 04/08/2017   Procedure: CARDIOVERSION;  Surgeon: Wellington Hampshire, MD;  Location: [facility];  Service: Cardiovascular;  Laterality: N/A;  . COLONOSCOPY  12/2009   2 small tubular adenomas  . CORONARY ARTERY BYPASS GRAFT  09/2000  . ECTOPIC PREGNANCY SURGERY    . ELECTROMAGNETIC NAVIGATION BROCHOSCOPY N/A 10/06/2015   Procedure: ELECTROMAGNETIC NAVIGATION BRONCHOSCOPY;  Surgeon: Flora Lipps, MD;   Location: [facility];  Service: Cardiopulmonary;  Laterality: N/A;  . ENDOBRONCHIAL ULTRASOUND N/A 10/06/2015   Procedure: ENDOBRONCHIAL ULTRASOUND;  Surgeon: Flora Lipps, MD;  Location: [facility];  Service: Cardiopulmonary;  Laterality: N/A;  . KYPHOPLASTY N/A 09/28/2016   Procedure: KYPHOPLASTY;  Surgeon: Hessie Knows, MD;  Location: [facility];  Service: Orthopedics;  Laterality: N/A;  . PACEMAKER INSERTION  03/2012  . thorocentesis  12/24/2016  . VAGINAL HYSTERECTOMY     partial - left ovary remains    Current Outpatient Medications  Medication Sig Dispense Refill  . acetaminophen (TYLENOL) 500 MG tablet Take 1,000 mg by mouth every 6 (six) hours as needed.     Marland Kitchen amiodarone (PACERONE) 200 MG tablet Take 200 mg by mouth daily.     Marland Kitchen apixaban (ELIQUIS) 5 MG TABS tablet Take 5 mg by mouth 2 (two) times daily.     Marland Kitchen atorvastatin (LIPITOR) 20 MG tablet TAKE 1 TABLET (20 MG TOTAL) BY MOUTH AT BEDTIME. 30 tablet 5  . Calcium Carb-Cholecalciferol (CALCIUM 600-D PO) Take 1 tablet by mouth every morning.     . Cetirizine HCl 10 MG CAPS Take 1 capsule (10 mg total) by mouth 1 day or 1 dose. (Patient taking differently: Take 1 capsule by mouth daily. ) 30 capsule 5  . ENTRESTO 24-26 MG TAKE 1 TABLET BY MOUTH TWICE A DAY  60 tablet 6  . fluticasone furoate-vilanterol (BREO ELLIPTA) 200-25 MCG/INH AEPB Inhale 1 puff into the lungs daily.    . furosemide (LASIX) 40 MG tablet Take 1 tablet (40 mg total) by mouth 2 (two) times daily. (Patient taking differently: Take 40 mg by mouth 2 (two) times daily. Pt takes bid except she does not take any on Sunday.) 60 tablet 6  . ipratropium-albuterol (DUONEB) 0.5-2.5 (3) MG/3ML SOLN Take 3 mLs by nebulization every 6 (six) hours as needed.    Marland Kitchen levothyroxine (SYNTHROID, LEVOTHROID) 88 MCG tablet Take 1 tablet (88 mcg total) by mouth daily before breakfast. 30 tablet 5  . metoprolol succinate (TOPROL-XL) 25 MG 24 hr tablet TAKE 1 TABLET BY MOUTH EVERY MORNING AND 2 TABS IN  THE EVENING 90 tablet 3  . OXYGEN Inhale 2 L into the lungs.     . senna (SENOKOT) 8.6 MG TABS tablet Take 1 tablet (8.6 mg total) by mouth daily. (Patient taking differently: Take 1 tablet by mouth at bedtime as needed. ) 30 each 0  . umeclidinium bromide (INCRUSE ELLIPTA) 62.5 MCG/INH AEPB Inhale 1 puff into the lungs daily. 1 each 0   No current facility-administered medications for this visit.    Facility-Administered Medications Ordered in Other Visits  Medication Dose Route Frequency Provider Last Rate Last Dose  . sodium chloride 0.9 % injection 10 mL  10 mL Intravenous PRN Forest Gleason, MD   10 mL at 02/19/15 1000  . sodium chloride flush (NS) 0.9 % injection 10 mL  10 mL Intravenous PRN Cammie Sickle, MD   10 mL at 02/16/16 1048     Allergies:   Lovenox [enoxaparin sodium] and Meperidine   Social History:  The patient  reports that she quit smoking about 17 years ago. Her smoking use included cigarettes. She has a 40.00 pack-year smoking history. she has never used smokeless tobacco. She reports that she drinks about 6.0 oz of alcohol per week. She reports that she does not use drugs.   Family History:   family history includes COPD in her brother, mother, and sister; Colon cancer in her brother; Healthy in her father; Hypertension in her brother and sister; Stroke in her maternal grandmother.    Review of Systems: Review of Systems  Respiratory: Positive for shortness of breath.   Cardiovascular: Negative.   Gastrointestinal: Negative.   Musculoskeletal: Positive for back pain and joint pain.       Leg weakness  Neurological: Negative.   Psychiatric/Behavioral: Negative.   All other systems reviewed and are negative.    PHYSICAL EXAM: VS:  BP 108/60 (BP Location: Right Arm, Patient Position: Sitting, Cuff Size: Normal)   Pulse 63   Ht 5\' 6"  (1.676 m)   Wt 162 lb (73.5 kg)   BMI 26.15 kg/m  , BMI Body mass index is 26.15 kg/m. GEN:  in no acute distress ,  frail HEENT: normal  Neck: no JVD, carotid bruits, or masses Cardiac: Regular, rapid, no murmurs, rubs, or gallops,no edema  Respiratory:  Mildly decreased breath sounds throughout, normal work of breathing GI: soft, nontender, nondistended, + BS MS: no deformity or atrophy  Skin: warm and dry, no rash Neuro:  Strength and sensation are intact Psych: euthymic mood, full affect    Recent Labs: 04/06/2017: B Natriuretic Peptide 1,022.0 08/25/2017: TSH 8.530 09/13/2017: ALT 18; BUN 21; Creatinine, Ser 1.31; Hemoglobin 13.6; Magnesium 2.0; Platelets 236; Potassium 4.0; Sodium 135    Lipid Panel Lab Results  Component Value Date   CHOL 131 08/25/2017   HDL 36 (L) 08/25/2017   LDLCALC 63 08/25/2017   TRIG 159 (H) 08/25/2017      Wt Readings from Last 3 Encounters:  10/19/17 162 lb (73.5 kg)  09/15/17 162 lb 2 oz (73.5 kg)  09/13/17 159 lb 9.8 oz (72.4 kg)       ASSESSMENT AND PLAN:  Essential hypertension -  Blood pressure is well controlled on today's visit. No changes made to the medications. Stable  Paroxysmal atrial fibrillation (HCC) - Previous ablation at [facility] On anticoagulation Maintaining normal sinus rhythm  Atrial flutter Followed by Dr. Glennon Mac, EP at [facility] Previous cardioversion in the hospital Recommended she stay on amiodarone 200 mg daily Unable to exclude paroxysmal episodes given episodes of shortness of breath but suspect she is maintaining normal sinus rhythm  Hyperlipidemia - She does not want Lipitor After long discussion we will start Crestor 5 mg daily Goal LDL less than 70  Chronic systolic CHF (congestive heart failure) (HCC) Continue Lasix 40 twice daily She does report some episodes of shortness of breath, suspect secondary to underlying COPD Recommend she try her nebulizer  DM type 2, goal A1c below 7 Continue aggressive diet Unable to exercise, stable  COPD exacerbation ([place]) Previously treated with prednisone Followed by  pulmonary Recommend nebulizer as needed She does not have Ventolin inhaler Periods of shortness of breath  Chronic back pain Completed physical therapy, scheduled to see orthopedics   Total encounter time more than 25 minutes  Greater than 50% was spent in counseling and coordination of care with the patient   Disposition:   F/U  6 months   Orders Placed This Encounter  Procedures  . EKG 12-Lead     Signed, Esmond Plants, M.D., Ph.D. 10/19/2017  [place], [place]

## 2017-10-19 ENCOUNTER — Ambulatory Visit (INDEPENDENT_AMBULATORY_CARE_PROVIDER_SITE_OTHER): Payer: Medicare Other | Admitting: Cardiovascular Disease

## 2017-10-19 ENCOUNTER — Encounter: Payer: Self-pay | Admitting: Cardiovascular Disease

## 2017-10-19 VITALS — BP 108/60 | HR 63 | Ht 66.0 in | Wt 162.0 lb

## 2017-10-19 DIAGNOSIS — I483 Typical atrial flutter: Secondary | ICD-10-CM

## 2017-10-19 DIAGNOSIS — I10 Essential (primary) hypertension: Secondary | ICD-10-CM | POA: Diagnosis not present

## 2017-10-19 DIAGNOSIS — I5022 Chronic systolic (congestive) heart failure: Secondary | ICD-10-CM | POA: Diagnosis not present

## 2017-10-19 DIAGNOSIS — I480 Paroxysmal atrial fibrillation: Secondary | ICD-10-CM

## 2017-10-19 DIAGNOSIS — Z950 Presence of cardiac pacemaker: Secondary | ICD-10-CM | POA: Diagnosis not present

## 2017-10-19 DIAGNOSIS — J449 Chronic obstructive pulmonary disease, unspecified: Secondary | ICD-10-CM | POA: Diagnosis not present

## 2017-10-19 DIAGNOSIS — I420 Dilated cardiomyopathy: Secondary | ICD-10-CM

## 2017-10-19 DIAGNOSIS — E119 Type 2 diabetes mellitus without complications: Secondary | ICD-10-CM

## 2017-10-19 MED ORDER — ROSUVASTATIN CALCIUM 5 MG PO TABS: 5.0000 mg | ORAL_TABLET | Freq: Every day | ORAL | 3 refills | Status: DC

## 2017-10-19 NOTE — Patient Instructions (Addendum)
Medication Instructions:   Hold the lipitor Start the crestor 5 mg daily  Labwork:  No new labs needed  Testing/Procedures:  No further testing at this time   Follow-Up: It was a pleasure seeing you in the office today. Please call us if you have new issues that need to be addressed before your next appt.  [phone]  Your physician wants you to follow-up in: 6 months.  You will receive a reminder letter in the mail two months in advance. If you don't receive a letter, please call our office to schedule the follow-up appointment.  If you need a refill on your cardiac medications before your next appointment, please call your pharmacy.  For educational health videos Log in to : [email or web address] Or : [email or web address], password : [email or web address]

## 2017-11-22 ENCOUNTER — Encounter: Payer: Self-pay | Admitting: Internal Medicine

## 2017-11-22 ENCOUNTER — Ambulatory Visit (INDEPENDENT_AMBULATORY_CARE_PROVIDER_SITE_OTHER): Payer: Medicare Other | Admitting: Internal Medicine

## 2017-11-22 VITALS — BP 116/70 | HR 82 | Ht 66.0 in | Wt 162.8 lb

## 2017-11-22 DIAGNOSIS — J449 Chronic obstructive pulmonary disease, unspecified: Secondary | ICD-10-CM | POA: Diagnosis not present

## 2017-11-22 NOTE — Progress Notes (Signed)
Date: 11/22/2017  MRN# [id number] EVAH RASHID [birth year]  PMD - Dr. Gayland Curry JEANENE MENA is a 82 y.o. old female seen in follow up for  LLL mass/COPD/Fibrosis    Synopsis - 82 year old female first evaluated by pulmonary in early 2016 for chronic cough, found to have right hilar mass, biopsy of mass and pathology specimens positive for squamous cell, stage IIIa. Now status post chemoradiation, recently found to have left lower lobe mass, with chronic cough, chronic antibiotics. Biopsy, EBUS, ENB, of left lower lobe mass negative for malignancy  chronic cough, along with postradiation fibrosis. 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 Status post bronchoscopy in February 2017-negative for malignancy; May 5th PET scan shows no significant concerns for recurrent malignancy; shows radiation changes; pleural effusion # Clinically no evidence of progression at this time. Would recommend a follow-up CT scan of the chest with contrast in 2 months. # Recent Right pleural effusion [may 2018]- status post thoracentesis with symptomatic improvement; likely from CHF. Currently resolved.   CC Follow up cough and SOB   HPI Chronic SOB/DOE Patient respirations symptoms previously improved with BREO 200 andINCRUSE Patient with very poor resp status but seems to be a little better since last visit and seems to be at baseline  she has also had an abalation for A.fib Uses albuterol as needed Has chronic SOB and DOE Her 6 minute walk test was within normal limits Her ONO was positive for hypoxia and is on 2 L nasal cannula at night   No obvious signs of infection at this time No obvious signs of acute heart failure at this time  Allergies:  Lovenox [enoxaparin sodium] and Meperidine  Review of Systems: Gen:  Denies  fever, sweats, chills HEENT: Denies blurred vision, double vision,  ear pain, eye pain, hearing loss, nose bleeds, sore throat. Mild runny nose mostly in the mornings Cvc:  No dizziness, chest pain or heaviness Resp:  Sob, mild wheezing, cough - mainly non productive.  Gi: Denies swallowing difficulty, stomach pain, nausea or vomiting, diarrhea, constipation, bowel incontinence Other:  Right hand and foot numbness  Physical Examination:  BP 116/70 (BP Location: Left Arm, Cuff Size: Normal)   Pulse 82   Ht 5\' 6"  (1.676 m)   Wt 162 lb 12.8 oz (73.8 kg)   SpO2 93%   BMI 26.28 kg/m     General Appearance: No distress  Neuro:without focal findings, mental status, speech normal, alert and oriented, cranial nerves 2-12 intact, reflexes normal and symmetric, sensation grossly normal - right/left hand - sensation intact, strength 5/5 HEENT: PERRLA, EOM intact, no ptosis, no other lesions noticed; Mallampati 2 Pulmonary: coarse upper airway sounds,no wheezing noted on exam today, good airway entry throughout    Sputum Production:  none CardiovascularNormal S1,S2.  No m/r/g.  Abdominal aorta pulsation normal.       ECHO 03/2014 MILD LV DYSFUNCTION  NORMAL RIGHT VENTRICULAR SYSTOLIC FUNCTION VALVULAR REGURGITATION: MILD MR, TRIVIAL PR, MILD TR PROSTHETIC VALVE(S): PROSTHETIC MV RING    Assessment and Plan:  82 year old female past medical history of chronic cough, moderate Gold stage B COPD, stage IIIa non-small cell lung cancer, status post chemoradiation, seen in follow-up visit for chronic SOB and DOE, suspected postradiation pneumonitis/fibrosis with allergic rhinitis with underlying afib with CHF with chronic hypoxic respiratory failure  I have recommended her restarting her BREO 200 daily Patient was very  satisfied with this plan of care     Radiation pneumonitis ([place]) -No prednisone therapy at this time will reassess her resp  symptoms in the next 3-6 months with regard to prednisone therapy  Cough Multifactorial: Postradiation pneumonitis,  fibrosis, architectural distortion, anatomical distortion of the right mainstem, right hilum inflammation, left lower lobe mass, COPD, Patient will likely have chronic cough, and some level of chronic dyspnea level the persistent post radiation fibrosis in the right lung Continue BREO 200  COPD -continue   BREO 200 -continue Incruse   Chronic hypoxic respiratory failure -Continue 2 L oxygen nasal cannula at nighttime and with exertion Patient uses and benefits from oxygen use  CHF afib  continue anticoagulation Follow up cardiology  Lung Cancer Follow up Oncology as scheduled  Follow up in 6 months  Patient satisfied with Plan of action and management. All questions answered  Corrin Parker, M.D.  [facility]  Medical Director [place] Director [facility] Cardio-Pulmonary Department

## 2017-11-22 NOTE — Patient Instructions (Signed)
Continue BREo Continue incruse Continue oxygen as prescribed

## 2017-12-02 ENCOUNTER — Other Ambulatory Visit: Payer: Self-pay | Admitting: *Deleted

## 2017-12-02 DIAGNOSIS — C3401 Malignant neoplasm of right main bronchus: Secondary | ICD-10-CM

## 2017-12-08 ENCOUNTER — Ambulatory Visit
Admission: RE | Admit: 2017-12-08 | Discharge: 2017-12-08 | Disposition: A | Discharge status: Home / Self Care | Payer: Medicare Other | Source: Ambulatory Visit | Attending: Internal Medicine | Admitting: Internal Medicine

## 2017-12-08 ENCOUNTER — Inpatient Hospital Stay: Payer: Medicare Other

## 2017-12-08 DIAGNOSIS — R918 Other nonspecific abnormal finding of lung field: Secondary | ICD-10-CM

## 2017-12-08 DIAGNOSIS — J90 Pleural effusion, not elsewhere classified: Secondary | ICD-10-CM | POA: Insufficient documentation

## 2017-12-08 DIAGNOSIS — I700 Atherosclerosis of aorta: Secondary | ICD-10-CM | POA: Insufficient documentation

## 2017-12-08 DIAGNOSIS — J9621 Acute and chronic respiratory failure with hypoxia: Secondary | ICD-10-CM | POA: Diagnosis not present

## 2017-12-08 DIAGNOSIS — J439 Emphysema, unspecified: Secondary | ICD-10-CM | POA: Insufficient documentation

## 2017-12-08 DIAGNOSIS — C3401 Malignant neoplasm of right main bronchus: Secondary | ICD-10-CM

## 2017-12-08 DIAGNOSIS — J9601 Acute respiratory failure with hypoxia: Secondary | ICD-10-CM | POA: Diagnosis not present

## 2017-12-08 DIAGNOSIS — Z923 Personal history of irradiation: Secondary | ICD-10-CM | POA: Insufficient documentation

## 2017-12-11 ENCOUNTER — Inpatient Hospital Stay
Admission: EM | Admit: 2017-12-11 | Discharge: 2017-12-14 | DRG: 189 | Disposition: A | Discharge status: Home / Self Care | Payer: Medicare Other | Attending: Internal Medicine | Admitting: Internal Medicine

## 2017-12-11 ENCOUNTER — Emergency Department: Payer: Medicare Other

## 2017-12-11 ENCOUNTER — Encounter: Payer: Self-pay | Admitting: Emergency Medicine

## 2017-12-11 ENCOUNTER — Other Ambulatory Visit: Payer: Self-pay

## 2017-12-11 DIAGNOSIS — Z87891 Personal history of nicotine dependence: Secondary | ICD-10-CM | POA: Diagnosis not present

## 2017-12-11 DIAGNOSIS — Z7901 Long term (current) use of anticoagulants: Secondary | ICD-10-CM

## 2017-12-11 DIAGNOSIS — J9601 Acute respiratory failure with hypoxia: Secondary | ICD-10-CM | POA: Diagnosis present

## 2017-12-11 DIAGNOSIS — I959 Hypotension, unspecified: Secondary | ICD-10-CM | POA: Diagnosis present

## 2017-12-11 DIAGNOSIS — Z923 Personal history of irradiation: Secondary | ICD-10-CM | POA: Diagnosis not present

## 2017-12-11 DIAGNOSIS — E039 Hypothyroidism, unspecified: Secondary | ICD-10-CM | POA: Diagnosis present

## 2017-12-11 DIAGNOSIS — Z952 Presence of prosthetic heart valve: Secondary | ICD-10-CM

## 2017-12-11 DIAGNOSIS — Z86718 Personal history of other venous thrombosis and embolism: Secondary | ICD-10-CM

## 2017-12-11 DIAGNOSIS — I130 Hypertensive heart and chronic kidney disease with heart failure and stage 1 through stage 4 chronic kidney disease, or unspecified chronic kidney disease: Secondary | ICD-10-CM | POA: Diagnosis present

## 2017-12-11 DIAGNOSIS — Z950 Presence of cardiac pacemaker: Secondary | ICD-10-CM | POA: Diagnosis not present

## 2017-12-11 DIAGNOSIS — J9621 Acute and chronic respiratory failure with hypoxia: Secondary | ICD-10-CM | POA: Diagnosis present

## 2017-12-11 DIAGNOSIS — I2510 Atherosclerotic heart disease of native coronary artery without angina pectoris: Secondary | ICD-10-CM | POA: Diagnosis present

## 2017-12-11 DIAGNOSIS — I5022 Chronic systolic (congestive) heart failure: Secondary | ICD-10-CM | POA: Diagnosis present

## 2017-12-11 DIAGNOSIS — K219 Gastro-esophageal reflux disease without esophagitis: Secondary | ICD-10-CM | POA: Diagnosis present

## 2017-12-11 DIAGNOSIS — J441 Chronic obstructive pulmonary disease with (acute) exacerbation: Secondary | ICD-10-CM | POA: Diagnosis present

## 2017-12-11 DIAGNOSIS — Z9861 Coronary angioplasty status: Secondary | ICD-10-CM | POA: Diagnosis not present

## 2017-12-11 DIAGNOSIS — G629 Polyneuropathy, unspecified: Secondary | ICD-10-CM | POA: Diagnosis present

## 2017-12-11 DIAGNOSIS — T380X5A Adverse effect of glucocorticoids and synthetic analogues, initial encounter: Secondary | ICD-10-CM | POA: Diagnosis present

## 2017-12-11 DIAGNOSIS — I484 Atypical atrial flutter: Secondary | ICD-10-CM | POA: Diagnosis present

## 2017-12-11 DIAGNOSIS — Z9981 Dependence on supplemental oxygen: Secondary | ICD-10-CM | POA: Diagnosis not present

## 2017-12-11 DIAGNOSIS — E785 Hyperlipidemia, unspecified: Secondary | ICD-10-CM | POA: Diagnosis present

## 2017-12-11 DIAGNOSIS — Z951 Presence of aortocoronary bypass graft: Secondary | ICD-10-CM

## 2017-12-11 DIAGNOSIS — R739 Hyperglycemia, unspecified: Secondary | ICD-10-CM | POA: Diagnosis present

## 2017-12-11 DIAGNOSIS — Z79899 Other long term (current) drug therapy: Secondary | ICD-10-CM

## 2017-12-11 DIAGNOSIS — Z85118 Personal history of other malignant neoplasm of bronchus and lung: Secondary | ICD-10-CM

## 2017-12-11 DIAGNOSIS — I252 Old myocardial infarction: Secondary | ICD-10-CM

## 2017-12-11 DIAGNOSIS — Z9221 Personal history of antineoplastic chemotherapy: Secondary | ICD-10-CM

## 2017-12-11 HISTORY — DX: Acute respiratory failure with hypoxia: J96.01

## 2017-12-11 LAB — BASIC METABOLIC PANEL
Anion gap: 8 (ref 5–15)
BUN: 21 mg/dL — AB (ref 6–20)
CO2: 30 mmol/L (ref 22–32)
CREATININE: 1.4 mg/dL — AB (ref 0.44–1.00)
Calcium: 9 mg/dL (ref 8.9–10.3)
Chloride: 97 mmol/L — ABNORMAL LOW (ref 101–111)
GFR, EST AFRICAN AMERICAN: 40 mL/min — AB (ref >60)
GFR, EST NON AFRICAN AMERICAN: 35 mL/min — AB (ref >60)
Glucose, Bld: 170 mg/dL — ABNORMAL HIGH (ref 65–99)
POTASSIUM: 3.9 mmol/L (ref 3.5–5.1)
SODIUM: 135 mmol/L (ref 135–145)

## 2017-12-11 LAB — BRAIN NATRIURETIC PEPTIDE: B NATRIURETIC PEPTIDE 5: 900 pg/mL — AB (ref 0.0–100.0)

## 2017-12-11 LAB — CBC WITH DIFFERENTIAL/PLATELET
BASOS PCT: 1 %
Basophils Absolute: 0 10*3/uL (ref 0–0.1)
EOS ABS: 0.5 10*3/uL (ref 0–0.7)
EOS PCT: 9 %
HCT: 38.5 % (ref 35.0–47.0)
Hemoglobin: 13 g/dL (ref 12.0–16.0)
LYMPHS ABS: 0.6 10*3/uL — AB (ref 1.0–3.6)
Lymphocytes Relative: 10 %
MCH: 32.3 pg (ref 26.0–34.0)
MCHC: 33.8 g/dL (ref 32.0–36.0)
MCV: 95.6 fL (ref 80.0–100.0)
Monocytes Absolute: 0.5 10*3/uL (ref 0.2–0.9)
Monocytes Relative: 9 %
Neutro Abs: 3.9 10*3/uL (ref 1.4–6.5)
Neutrophils Relative %: 71 %
PLATELETS: 182 10*3/uL (ref 150–440)
RBC: 4.03 MIL/uL (ref 3.80–5.20)
RDW: 16.2 % — ABNORMAL HIGH (ref 11.5–14.5)
WBC: 5.4 10*3/uL (ref 3.6–11.0)

## 2017-12-11 LAB — MRSA PCR SCREENING: MRSA BY PCR: NEGATIVE

## 2017-12-11 LAB — TROPONIN I: Troponin I: <0.03 ng/mL (ref <0.03)

## 2017-12-11 LAB — GLUCOSE, CAPILLARY: GLUCOSE-CAPILLARY: 141 mg/dL — AB (ref 65–99)

## 2017-12-11 LAB — INFLUENZA PANEL BY PCR (TYPE A & B)
INFLAPCR: NEGATIVE
Influenza B By PCR: NEGATIVE

## 2017-12-11 MED ORDER — ACETAMINOPHEN 650 MG RE SUPP: 650.0000 mg | Freq: Four times a day (QID) | RECTAL | Status: DC | PRN

## 2017-12-11 MED ORDER — METHYLPREDNISOLONE SODIUM SUCC 125 MG IJ SOLR
125.0000 mg | Freq: Once | INTRAMUSCULAR | Status: AC
  Administered 2017-12-11: 125 mg via INTRAVENOUS
  Filled 2017-12-11: qty 2

## 2017-12-11 MED ORDER — LEVOTHYROXINE SODIUM 88 MCG PO TABS
88.0000 ug | ORAL_TABLET | Freq: Every day | ORAL | Status: DC
  Administered 2017-12-12 – 2017-12-14 (×3): 88 ug via ORAL
  Filled 2017-12-11 (×3): qty 1

## 2017-12-11 MED ORDER — ROSUVASTATIN CALCIUM 10 MG PO TABS
5.0000 mg | ORAL_TABLET | Freq: Every day | ORAL | Status: DC
  Administered 2017-12-11 – 2017-12-13 (×3): 5 mg via ORAL
  Filled 2017-12-11 (×4): qty 1

## 2017-12-11 MED ORDER — BUDESONIDE 0.5 MG/2ML IN SUSP
0.5000 mg | Freq: Two times a day (BID) | RESPIRATORY_TRACT | Status: DC
  Administered 2017-12-11 – 2017-12-14 (×6): 0.5 mg via RESPIRATORY_TRACT
  Filled 2017-12-11 (×6): qty 2

## 2017-12-11 MED ORDER — IPRATROPIUM-ALBUTEROL 0.5-2.5 (3) MG/3ML IN SOLN
3.0000 mL | Freq: Once | RESPIRATORY_TRACT | Status: AC
Start: 2017-12-11 — End: 2017-12-11
  Administered 2017-12-11: 3 mL via RESPIRATORY_TRACT

## 2017-12-11 MED ORDER — METOPROLOL SUCCINATE ER 25 MG PO TB24
25.0000 mg | ORAL_TABLET | Freq: Every day | ORAL | Status: DC
  Administered 2017-12-11 – 2017-12-13 (×3): 25 mg via ORAL
  Filled 2017-12-11 (×3): qty 1

## 2017-12-11 MED ORDER — ORAL CARE MOUTH RINSE
15.0000 mL | Freq: Two times a day (BID) | OROMUCOSAL | Status: DC
  Administered 2017-12-12 – 2017-12-13 (×2): 15 mL via OROMUCOSAL

## 2017-12-11 MED ORDER — METOPROLOL SUCCINATE ER 50 MG PO TB24
50.0000 mg | ORAL_TABLET | Freq: Every day | ORAL | Status: DC
  Administered 2017-12-11 – 2017-12-13 (×3): 50 mg via ORAL
  Filled 2017-12-11 (×3): qty 1

## 2017-12-11 MED ORDER — CHLORHEXIDINE GLUCONATE 0.12 % MT SOLN
15.0000 mL | Freq: Two times a day (BID) | OROMUCOSAL | Status: DC
  Filled 2017-12-11: qty 15

## 2017-12-11 MED ORDER — IPRATROPIUM-ALBUTEROL 0.5-2.5 (3) MG/3ML IN SOLN
3.0000 mL | Freq: Once | RESPIRATORY_TRACT | Status: AC
  Administered 2017-12-11: 3 mL via RESPIRATORY_TRACT

## 2017-12-11 MED ORDER — ONDANSETRON HCL 4 MG PO TABS: 4.0000 mg | ORAL_TABLET | Freq: Four times a day (QID) | ORAL | Status: DC | PRN

## 2017-12-11 MED ORDER — DOXYCYCLINE HYCLATE 100 MG PO TABS
100.0000 mg | ORAL_TABLET | Freq: Two times a day (BID) | ORAL | Status: DC
  Administered 2017-12-11 – 2017-12-13 (×6): 100 mg via ORAL
  Filled 2017-12-11 (×8): qty 1

## 2017-12-11 MED ORDER — ORAL CARE MOUTH RINSE: 15.0000 mL | Freq: Two times a day (BID) | OROMUCOSAL | Status: DC

## 2017-12-11 MED ORDER — LORATADINE 10 MG PO TABS
10.0000 mg | ORAL_TABLET | Freq: Every day | ORAL | Status: DC
  Administered 2017-12-11 – 2017-12-13 (×3): 10 mg via ORAL
  Filled 2017-12-11 (×3): qty 1

## 2017-12-11 MED ORDER — AMIODARONE HCL 200 MG PO TABS
200.0000 mg | ORAL_TABLET | Freq: Every day | ORAL | Status: DC
  Administered 2017-12-11 – 2017-12-13 (×3): 200 mg via ORAL
  Filled 2017-12-11 (×3): qty 1

## 2017-12-11 MED ORDER — METHYLPREDNISOLONE SODIUM SUCC 40 MG IJ SOLR
40.0000 mg | Freq: Two times a day (BID) | INTRAMUSCULAR | Status: DC
  Administered 2017-12-11 – 2017-12-14 (×6): 40 mg via INTRAVENOUS
  Filled 2017-12-11 (×6): qty 1

## 2017-12-11 MED ORDER — IPRATROPIUM-ALBUTEROL 0.5-2.5 (3) MG/3ML IN SOLN
3.0000 mL | Freq: Four times a day (QID) | RESPIRATORY_TRACT | Status: DC
  Administered 2017-12-11 – 2017-12-14 (×12): 3 mL via RESPIRATORY_TRACT
  Filled 2017-12-11 (×12): qty 3

## 2017-12-11 MED ORDER — SODIUM CHLORIDE 0.9 % IV SOLN
1.0000 g | Freq: Once | INTRAVENOUS | Status: AC
  Administered 2017-12-11: 1 g via INTRAVENOUS
  Filled 2017-12-11: qty 10

## 2017-12-11 MED ORDER — FUROSEMIDE 40 MG PO TABS
40.0000 mg | ORAL_TABLET | Freq: Two times a day (BID) | ORAL | Status: DC
  Administered 2017-12-11 – 2017-12-14 (×7): 40 mg via ORAL
  Filled 2017-12-11 (×2): qty 1
  Filled 2017-12-11: qty 2
  Filled 2017-12-11 (×6): qty 1

## 2017-12-11 MED ORDER — SENNA 8.6 MG PO TABS
1.0000 | ORAL_TABLET | Freq: Every evening | ORAL | Status: DC | PRN
Start: 2017-12-11 — End: 2017-12-14
  Administered 2017-12-13: 8.6 mg via ORAL
  Filled 2017-12-11: qty 1

## 2017-12-11 MED ORDER — ONDANSETRON HCL 4 MG/2ML IJ SOLN: 4.0000 mg | Freq: Four times a day (QID) | INTRAMUSCULAR | Status: DC | PRN

## 2017-12-11 MED ORDER — IPRATROPIUM-ALBUTEROL 0.5-2.5 (3) MG/3ML IN SOLN
RESPIRATORY_TRACT | Status: AC
  Administered 2017-12-11: 3 mL via RESPIRATORY_TRACT
  Filled 2017-12-11: qty 3

## 2017-12-11 MED ORDER — ACETAMINOPHEN 325 MG PO TABS
650.0000 mg | ORAL_TABLET | Freq: Four times a day (QID) | ORAL | Status: DC | PRN
  Administered 2017-12-11 – 2017-12-13 (×6): 650 mg via ORAL
  Filled 2017-12-11 (×6): qty 2

## 2017-12-11 MED ORDER — SACUBITRIL-VALSARTAN 24-26 MG PO TABS
1.0000 | ORAL_TABLET | Freq: Two times a day (BID) | ORAL | Status: DC
  Administered 2017-12-11 – 2017-12-13 (×6): 1 via ORAL
  Filled 2017-12-11 (×8): qty 1

## 2017-12-11 MED ORDER — SODIUM CHLORIDE 0.9 % IV SOLN: 500.0000 mg | Freq: Once | INTRAVENOUS | Status: DC

## 2017-12-11 MED ORDER — APIXABAN 5 MG PO TABS
5.0000 mg | ORAL_TABLET | Freq: Two times a day (BID) | ORAL | Status: DC
  Administered 2017-12-11 – 2017-12-13 (×6): 5 mg via ORAL
  Filled 2017-12-11 (×6): qty 1

## 2017-12-11 NOTE — ED Provider Notes (Addendum)
[facility] Emergency Department Provider Note    First MD Initiated Contact with Patient 12/11/17 [phone]     (approximate)  I have reviewed the triage vital signs and the nursing notes.   HISTORY  Chief Complaint Shortness of Breath    HPI Jenny Cooper is a 82 y.o. female with blow list of chronic medical conditions including COPD and lung cancer presents to the emergency department with respiratory distress. Patient states that dyspnea since Friday which is progressively worsened since onset. She denies any fever but does admit to a nonproductive cough   Past Medical History:  Diagnosis Date  . Atypical atrial flutter ([place])    a. s/p ablation 07/27/2013 followed by Dr. Rockey Situ  . CAD (coronary artery disease)    a. s/p MI x 2 in 2002 s/p PCI x 2 in 2002; b. s/p 2v CABG 2002; c. stress echo 07/2004 w/ evi of pos & inf infarct & no evi of ischemia; d. 4/08 dipyridamole scan w/ multiple areas of infarct, no ischemia, EF 49%; e. cath 04/28/15 3v CAD, med Rx rec, no targets for revasc, LM lum irregs, pLAD 30%, 100%, ost-pLCx 60%, [name] 99%, OM2 100%, p-mRCA 90%, [name] 100% L-R collats, VG-mLAD irregs, VG-OM2 oc  . Carcinoma of right lung ([place]) 01/03/2015   a. followed by Dr. Oliva Bustard  . Chronic systolic CHF (congestive heart failure) ([place])    a. echo 03/2015: EF 30-35%, sev ant/inf/pos HK, in mild to mod MR  . Complication of anesthesia    more recently patient oxygen levels do not rebound as quickly  . Compressed spine fracture ([place]) 08/06/2016   lumbar 2, t11, t12  . COPD (chronic obstructive pulmonary disease) ([place])   . Fractured pelvis ([place]) 05/26/2016   2 places  . GERD (gastroesophageal reflux disease)   . History of blood clots    12/2001  . History of colonoscopy 2013  . History of mammography, screening 2015  . History of Papanicolaou smear of cervix 2013  . HLD (hyperlipidemia)   . HTN (hypertension)   . Hypothyroidism   . Lung cancer ([place])     . Mitral regurgitation    a. s/p mitral ring placement 09/2000; b. echo 09/2010: EF 50%, inf HK, post AK, mild MR, prosthetic mitral valve ring w/ peak gradient of 10 mmHg; b. echo 2/13: EF 50%, mild MR/TR     . Myocardial infarction ([place])    X 2 (LAST ONE IN 2002)  . Neuropathy   . Pacemaker    a. MDT 2002; b. generator replacement 2013; c. followed by Dr. Omelia Blackwater, MD  . PAF (paroxysmal atrial fibrillation) ([place])    a. on Eliquis   . Personal history of chemotherapy   . Personal history of radiation therapy     Patient Active Problem List   Diagnosis Date Noted  . Muscle ache 09/15/2017  . Coronary artery disease of bypass graft of native heart with stable angina pectoris ([place]) 04/21/2017  . Chronic systolic heart failure ([place]) 04/14/2017  . Sinus node dysfunction ([place]) 04/06/2017  . Back pain of thoracolumbar region 03/15/2017  . PAF (paroxysmal atrial fibrillation) ([place]) 03/05/2017  . Falls frequently 03/05/2017  . Pleural effusion, right 12/23/2016  . Cancer of hilus of right lung ([place]) 12/14/2016  . Compression fracture of L2 lumbar vertebra ([place]) 08/31/2016  . Acute midline low back pain without sciatica 08/13/2016  . Fracture of multiple pubic rami, right, closed, initial encounter ([place]) 05/30/2016  . Cardiomyopathy ([place])  04/29/2016  . Type 2 diabetes mellitus with hemoglobin A1c goal of less than 7.0% ([place]) 11/17/2015  . Hx of adenomatous colonic polyps 11/17/2015  . Radiation pneumonitis ([place]) 10/21/2015  . Mitral regurgitation   . HLD (hyperlipidemia)   . Paroxysmal supraventricular tachycardia ([place])   . NSTEMI (non-ST elevated myocardial infarction) ([place])   . Arteriosclerosis of coronary artery 01/11/2015  . Hypothyroidism (acquired) 01/11/2015  . Disorder of peripheral nervous system 10/04/2014  . Cervical radiculopathy, chronic 10/04/2014  . Lung cancer ([place]) 10/04/2014  . Atrial flutter ([place]) 11/16/2013  . Chronic obstructive pulmonary disease ([place]) 04/24/2012   . Hypertension 08/03/2011  . Pacemaker 08/03/2011    Past Surgical History:  Procedure Laterality Date  . ABLATION  04/2016   [facility]  . APPENDECTOMY    . CARDIAC CATHETERIZATION N/A 04/28/2015   Procedure: Left Heart Cath and Coronary Angiography;  Surgeon: Wellington Hampshire, MD;  Location: [place] [facility];  Service: Cardiovascular;  Laterality: N/A;  . CARDIOVERSION N/A 04/08/2017   Procedure: CARDIOVERSION;  Surgeon: Wellington Hampshire, MD;  Location: [facility] ORS;  Service: Cardiovascular;  Laterality: N/A;  . COLONOSCOPY  12/2009   2 small tubular adenomas  . CORONARY ARTERY BYPASS GRAFT  09/2000  . ECTOPIC PREGNANCY SURGERY    . ELECTROMAGNETIC NAVIGATION BROCHOSCOPY N/A 10/06/2015   Procedure: ELECTROMAGNETIC NAVIGATION BRONCHOSCOPY;  Surgeon: Flora Lipps, MD;  Location: [facility] ORS;  Service: Cardiopulmonary;  Laterality: N/A;  . ENDOBRONCHIAL ULTRASOUND N/A 10/06/2015   Procedure: ENDOBRONCHIAL ULTRASOUND;  Surgeon: Flora Lipps, MD;  Location: [facility] OR[facility];  Service: Cardiopulmonary;  Laterality: N/A;  . KYPHOPLASTY N/A 09/28/2016   Procedure: KYPHOPLASTY;  Surgeon: Hessie Knows, MD;  Location: [facility] ORS;  Service: Orthopedics;  Laterality: N/A;  . PACEMAKER INSERTION  03/2012  . thorocentesis  12/24/2016  . VAGINAL HYSTERECTOMY     partial - left ovary remains    Prior to Admission medications   Medication Sig Start Date End Date Taking? Authorizing Provider  acetaminophen (TYLENOL) 500 MG tablet Take 1,000 mg by mouth every 6 (six) hours as needed.     [provider]  amiodarone (PACERONE) 200 MG tablet Take 200 mg by mouth daily.     [provider]  apixaban (ELIQUIS) 5 MG TABS tablet Take 5 mg by mouth 2 (two) times daily.  08/04/16   [provider]  Calcium Carb-Cholecalciferol (CALCIUM 600-D PO) Take 1 tablet by mouth every morning.     [provider]  Cetirizine HCl 10 MG CAPS Take 1 capsule (10 mg total) by mouth 1 day or 1 dose. Patient taking  differently: Take 1 capsule by mouth daily.  05/19/15   Dunn, Ryan M, PA-C  ENTRESTO 24-26 MG TAKE 1 TABLET BY MOUTH TWICE A DAY 08/08/17   Minna Merritts, MD  fluticasone furoate-vilanterol (BREO ELLIPTA) 200-25 MCG/INH AEPB Inhale 1 puff into the lungs daily.    [provider]  furosemide (LASIX) 40 MG tablet Take 1 tablet (40 mg total) by mouth 2 (two) times daily. Patient taking differently: Take 40 mg by mouth 2 (two) times daily. Pt takes bid except she does not take any on Sunday. 05/03/17   Minna Merritts, MD  ipratropium-albuterol (DUONEB) 0.5-2.5 (3) MG/3ML SOLN Take 3 mLs by nebulization every 6 (six) hours as needed.    [provider]  levothyroxine (SYNTHROID, LEVOTHROID) 88 MCG tablet Take 1 tablet (88 mcg total) by mouth daily before breakfast. 08/26/17   Glean Hess, MD  metoprolol succinate (TOPROL-XL) 25 MG 24 hr tablet TAKE 1 TABLET BY MOUTH EVERY MORNING AND 2 TABS IN THE EVENING 08/03/17   Minna Merritts, MD  OXYGEN Inhale 2 L into the lungs.     [provider]  rosuvastatin (CRESTOR) 5 MG tablet Take 1 tablet (5 mg total) by mouth daily. 10/19/17   Minna Merritts, MD  senna (SENOKOT) 8.6 MG TABS tablet Take 1 tablet (8.6 mg total) by mouth daily. Patient taking differently: Take 1 tablet by mouth at bedtime as needed.  05/27/16   Alfred Levins, [place], MD  umeclidinium bromide (INCRUSE ELLIPTA) 62.5 MCG/INH AEPB Inhale 1 puff into the lungs daily. 02/09/17   Flora Lipps, MD    Allergies Lovenox [enoxaparin sodium] and Meperidine  Family History  Problem Relation Age of Onset  . COPD Mother        sister, and brother  . Healthy Father   . Stroke Maternal Grandmother   . Hypertension Sister   . COPD Sister   . Hypertension Brother   . COPD Brother   . Colon cancer Brother   . Heart attack Neg Hx   . Breast cancer Neg Hx     Social History Social History   Tobacco Use  . Smoking status: Former Smoker    Packs/day: 1.00     Years: 40.00    Pack years: 40.00    Types: Cigarettes    Last attempt to quit: 08/30/2000    Years since quitting: 17.2  . Smokeless tobacco: Never Used  . Tobacco comment: quit smoking in 08/28/2000. Smoking cessation materials not required  Substance Use Topics  . Alcohol use: Yes    Alcohol/week: 6.0 oz    Types: 10 Glasses of wine per week    Comment: 2 glasses of wine per day  . Drug use: No    Review of Systems Constitutional: No fever/chills Eyes: No visual changes. ENT: No sore throat. Cardiovascular: Denies chest pain. Respiratory: Positive for dyspnea and cough Gastrointestinal: No abdominal pain.  No nausea, no vomiting.  No diarrhea.  No constipation. Genitourinary: Negative for dysuria. Musculoskeletal: Negative for neck pain.  Negative for back pain. Integumentary: Negative for rash. Neurological: Negative for headaches, focal weakness or numbness.   ____________________________________________   PHYSICAL EXAM:  VITAL SIGNS: ED Triage Vitals  Enc Vitals Group     BP 12/11/17 0714 (!) 141/78     Pulse Rate 12/11/17 0714 71     Resp 12/11/17 0718 (!) 21     Temp --      Temp src --      SpO2 12/11/17 0709 (!) 65 %     Weight 12/11/17 0707 72.6 kg (160 lb)     Height 12/11/17 0707 1.676 m (5\' 6" )     Head Circumference --      Peak Flow --      Pain Score 12/11/17 0707 0     Pain Loc --      Pain Edu? --      Excl. in [place]? --     Constitutional: Alert and oriented. apparent respiratory distress Eyes: Conjunctivae are normal.  Mouth/Throat: Mucous membranes are moist.  Oropharynx non-erythematous.Perioral cyanosis Neck: No stridor.  Cardiovascular: Normal rate, regular rhythm. Good peripheral circulation. Grossly normal heart sounds. Respiratory: tachypnea, positive accessory respiratory muscle use, diffuse rhonchi worse on right Gastrointestinal: Soft and nontender. No distention.  Musculoskeletal: No lower extremity tenderness nor edema. No gross  deformities of extremities. Neurologic:  Normal speech and language. No gross focal neurologic deficits are appreciated.  Skin:  Skin is warm, dry and intact. No rash noted. Psychiatric: Mood and affect are normal. Speech and behavior are normal.  ____________________________________________   LABS (all labs ordered are listed, but only abnormal results are displayed)  Labs Reviewed  CBC WITH DIFFERENTIAL/PLATELET - Abnormal; Notable for the following components:      Result Value   RDW 16.2 (*)    Lymphs Abs 0.6 (*)    All other components within normal limits  BASIC METABOLIC PANEL - Abnormal; Notable for the following components:   Chloride 97 (*)    Glucose, Bld 170 (*)    BUN 21 (*)    Creatinine, Ser 1.40 (*)    GFR calc non Af Amer 35 (*)    GFR calc Af Amer 40 (*)    All other components within normal limits  TROPONIN I  BLOOD GAS, VENOUS  BRAIN NATRIURETIC PEPTIDE   ____________________________________________  EKG  ED ECG REPORT I, [place] N Kalmen Lollar, the attending physician, personally viewed and interpreted this ECG.   Date: 12/11/2017  EKG Time: 7:16 AM  Rate:67  Rhythm:atrial paced complex sinus  rhythm  Axis: normal  Intervals:prolonged PR interval  ST&T Change: none  ____________________________________________  RADIOLOGY I,  N Vicent Febles, personally viewed and evaluated these images (plain radiographs) as part of my medical decision making, as well as reviewing the written report by the radiologist.  ED MD interpretation:  no evidence of pneumonia or pulmonary edema per radiologist.  Official radiology report(s): Dg Chest Port 1 View  Result Date: 12/11/2017 CLINICAL DATA:  Shortness of breath onset 2 days ago, worsening this morning. EXAM: PORTABLE CHEST 1 VIEW COMPARISON:  Chest x-rays dated 09/13/2017 and 04/06/2017. FINDINGS: Heart size and mediastinal contours are stable. LEFT chest wall pacemaker leads are grossly stable in position,  incompletely imaged inferiorly. RIGHT chest wall Port-A-Cath is stable in position with tip at the level of the lower SVC/cavoatrial Stable scarring within the RIGHT perihilar lung. No new lung findings. No pneumothorax seen. IMPRESSION: No active disease.  No evidence of pneumonia or pulmonary edema. Electronically Signed   By: Franki Cabot M.D.   On: 12/11/2017 08:02      .Critical Care Performed by: Gregor Hams, MD Authorized by: Gregor Hams, MD   Critical care provider statement:    Critical care time (minutes):  45   Critical care time was exclusive of:  Separately billable procedures and treating other patients and teaching time   Critical care was necessary to treat or prevent imminent or life-threatening deterioration of the following conditions:  Respiratory failure   Critical care was time spent personally by me on the following activities:  Development of treatment plan with patient or surrogate, discussions with consultants, evaluation of patient's response to treatment, examination of patient, obtaining history from patient or surrogate, ordering and performing treatments and interventions, ordering and review of laboratory studies, ordering and review of radiographic studies, pulse oximetry, re-evaluation of patient's condition and review of old charts   I assumed direction of critical care for this patient from another provider in my specialty: no       ____________________________________________   INITIAL IMPRESSION / ASSESSMENT AND PLAN / ED COURSE  As part of my medical decision making, I reviewed the following data within the electronic MEDICAL RECORD NUMBER  82 year old female presenting with above stated history of physical exam secondary to respiratory distress with hypoxia. Patient's  oxygen saturation on arrival to room 78%arrival to the emergency department 65%. Additional DuoNeb 2 was administered as well as IV Solu-Medrol and 25 mg.BiPAP was applied.  Chest x-ray showed no acute pneumonia or pulmonary edema. Concern for acute on chronic COPD exacerbation.on reevaluation patient states that she feels "much better". Patient discussed with Dr. Jerelyn Charles for hospital admission for further evaluation and management ____________________________________________  FINAL CLINICAL IMPRESSION(S) / ED DIAGNOSES  Final diagnoses:  Acute respiratory failure with hypoxia ([place])     MEDICATIONS GIVEN DURING THIS VISIT:  Medications  ipratropium-albuterol (DUONEB) 0.5-2.5 (3) MG/3ML nebulizer solution 3 mL (3 mLs Nebulization Given 12/11/17 0720)  ipratropium-albuterol (DUONEB) 0.5-2.5 (3) MG/3ML nebulizer solution 3 mL (3 mLs Nebulization Given 12/11/17 0720)  methylPREDNISolone sodium succinate (SOLU-MEDROL) 125 mg/2 mL injection 125 mg (125 mg Intravenous Given 12/11/17 0740)     ED Discharge Orders    None       Note:  This document was prepared using Dragon voice recognition software and may include unintentional dictation errors.    Gregor Hams, MD 12/11/17 7169    Gregor Hams, MD 12/11/17 [phone]

## 2017-12-11 NOTE — ED Triage Notes (Signed)
Pt here for [facility] starting Friday worse last night.  Wears 2 L [place] at baseline.  Hx COPD/CHF/lung CA s/p chemo/RT.  Labored in triage.

## 2017-12-11 NOTE — Progress Notes (Signed)
Patient ID: Jenny Cooper, female   DOB: 1936-06-12, 82 y.o.   MRN: [id number]  ACP discussion  Patient and son at the bedside  Diagnosis: Acute hypoxic respiratory failure, COPD exacerbation, chronic systolic congestive heart failure, paroxysmal atrial fibrillation, essential hypertension, history of lung cancer, hypothyroidism unspecified, hyperlipidemia unspecified, chronic kidney disease stage III.  CODE STATUS discussed at length.  Patient wishes to be a full code.  She thinks she does have a DNR at home but wishes to be a full code at this point.  Advised that she will be admitted to the ICU stepdown and she is on BiPAP.  This is one step away from intubation.  Need to watch respiratory status closely.  Time spent on ACP discussion 17 minutes  Dr. Loletha Grayer

## 2017-12-11 NOTE — H&P (Signed)
[place] [facility] [place] NAME: Jenny Cooper    MR#:  [id number]  DATE OF BIRTH:  [birth year]  DATE OF ADMISSION:  12/11/2017  PRIMARY CARE PHYSICIAN: Glean Hess, MD   REQUESTING/REFERRING PHYSICIAN: Dr Marjean Donna  CHIEF COMPLAINT:   Chief Complaint  Patient presents with  . Shortness of Breath    HISTORY OF PRESENT ILLNESS:  Jenny Cooper  is a 82 y.o. female with a known history of COPD, lung cancer treated previously with radiation and chemo, coronary artery disease and CHF.  She presents with shortness of breath going on since Friday.  She has been coughing up some clear phlegm.  She has been wheezing.  Shortness of breath got worse this morning and came into the hospital.  No complaints of chest pain.  In the emergency room, she had a chest x-ray that was read as negative.  She was found to be hypoxic with a pulse ox of 78% on room air.  She was placed on BiPAP for respiratory distress.  Hospitalist services were contacted for admission.  PAST MEDICAL HISTORY:   Past Medical History:  Diagnosis Date  . Atypical atrial flutter ([place])    a. s/p ablation 07/27/2013 followed by Dr. Rockey Situ  . CAD (coronary artery disease)    a. s/p MI x 2 in 2002 s/p PCI x 2 in 2002; b. s/p 2v CABG 2002; c. stress echo 07/2004 w/ evi of pos & inf infarct & no evi of ischemia; d. 4/08 dipyridamole scan w/ multiple areas of infarct, no ischemia, EF 49%; e. cath 04/28/15 3v CAD, med Rx rec, no targets for revasc, LM lum irregs, pLAD 30%, 100%, ost-pLCx 60%, [name] 99%, OM2 100%, p-mRCA 90%, [name] 100% L-R collats, VG-mLAD irregs, VG-OM2 oc  . Carcinoma of right lung ([place]) 01/03/2015   a. followed by Dr. Oliva Bustard  . Chronic systolic CHF (congestive heart failure) ([place])    a. echo 03/2015: EF 30-35%, sev ant/inf/pos HK, in mild to mod MR  . Complication of anesthesia    more recently patient oxygen levels do not rebound as quickly  . Compressed spine  fracture ([place]) 08/06/2016   lumbar 2, t11, t12  . COPD (chronic obstructive pulmonary disease) ([place])   . Fractured pelvis ([place]) 05/26/2016   2 places  . GERD (gastroesophageal reflux disease)   . History of blood clots    12/2001  . History of colonoscopy 2013  . History of mammography, screening 2015  . History of Papanicolaou smear of cervix 2013  . HLD (hyperlipidemia)   . HTN (hypertension)   . Hypothyroidism   . Lung cancer ([place])   . Mitral regurgitation    a. s/p mitral ring placement 09/2000; b. echo 09/2010: EF 50%, inf HK, post AK, mild MR, prosthetic mitral valve ring w/ peak gradient of 10 mmHg; b. echo 2/13: EF 50%, mild MR/TR     . Myocardial infarction ([place])    X 2 (LAST ONE IN 2002)  . Neuropathy   . Pacemaker    a. MDT 2002; b. generator replacement 2013; c. followed by Dr. Omelia Blackwater, MD  . PAF (paroxysmal atrial fibrillation) ([place])    a. on Eliquis   . Personal history of chemotherapy   . Personal history of radiation therapy     PAST SURGICAL HISTORY:   Past Surgical History:  Procedure Laterality Date  . ABLATION  04/2016   [facility]  . APPENDECTOMY    . CARDIAC CATHETERIZATION  N/A 04/28/2015   Procedure: Left Heart Cath and Coronary Angiography;  Surgeon: Wellington Hampshire, MD;  Location: [facility];  Service: Cardiovascular;  Laterality: N/A;  . CARDIOVERSION N/A 04/08/2017   Procedure: CARDIOVERSION;  Surgeon: Wellington Hampshire, MD;  Location: [facility];  Service: Cardiovascular;  Laterality: N/A;  . COLONOSCOPY  12/2009   2 small tubular adenomas  . CORONARY ARTERY BYPASS GRAFT  09/2000  . ECTOPIC PREGNANCY SURGERY    . ELECTROMAGNETIC NAVIGATION BROCHOSCOPY N/A 10/06/2015   Procedure: ELECTROMAGNETIC NAVIGATION BRONCHOSCOPY;  Surgeon: Flora Lipps, MD;  Location: [facility];  Service: Cardiopulmonary;  Laterality: N/A;  . ENDOBRONCHIAL ULTRASOUND N/A 10/06/2015   Procedure: ENDOBRONCHIAL ULTRASOUND;  Surgeon: Flora Lipps, MD;  Location: [facility];  Service:  Cardiopulmonary;  Laterality: N/A;  . KYPHOPLASTY N/A 09/28/2016   Procedure: KYPHOPLASTY;  Surgeon: Hessie Knows, MD;  Location: [facility];  Service: Orthopedics;  Laterality: N/A;  . PACEMAKER INSERTION  03/2012  . thorocentesis  12/24/2016  . VAGINAL HYSTERECTOMY     partial - left ovary remains    SOCIAL HISTORY:   Social History   Tobacco Use  . Smoking status: Former Smoker    Packs/day: 1.00    Years: 40.00    Pack years: 40.00    Types: Cigarettes    Last attempt to quit: 08/30/2000    Years since quitting: 17.2  . Smokeless tobacco: Never Used  . Tobacco comment: quit smoking in 08/28/2000. Smoking cessation materials not required  Substance Use Topics  . Alcohol use: Yes    Alcohol/week: 6.0 oz    Types: 10 Glasses of wine per week    Comment: 2 glasses of wine per day    FAMILY HISTORY:   Family History  Problem Relation Age of Onset  . COPD Mother        sister, and brother  . Lung disease Father   . Stroke Maternal Grandmother   . Hypertension Sister   . COPD Sister   . Hypertension Brother   . COPD Brother   . Colon cancer Brother   . Heart attack Neg Hx   . Breast cancer Neg Hx     DRUG ALLERGIES:   Allergies  Allergen Reactions  . Lovenox [Enoxaparin Sodium] Itching  . Meperidine Other (See Comments)    Other Reaction: pt does not like how it makes her feel    REVIEW OF SYSTEMS:  CONSTITUTIONAL: No fever, chills or sweats.  Does feel cold.  Positive for fatigue. EYES: No blurred or double vision.  EARS, NOSE, AND THROAT: No tinnitus or ear pain.  occasional sore throat.  Positive for  runny nose and postnasal drip. RESPIRATORY: Positive for cough, shortness of breath, and wheezing.  No hemoptysis.  CARDIOVASCULAR: No chest pain, orthopnea, edema.  GASTROINTESTINAL: Positive for nausea.  No vomiting, diarrhea or abdominal pain. No blood in bowel movements GENITOURINARY: No dysuria, hematuria.  ENDOCRINE: No polyuria, nocturia.  History of  hypothyroidism HEMATOLOGY: No anemia, easy bruising or bleeding SKIN: No rash or lesion. MUSCULOSKELETAL: No joint pain or arthritis.   NEUROLOGIC: No tingling, numbness, weakness.  PSYCHIATRY: No anxiety or depression.   MEDICATIONS AT HOME:   Prior to Admission medications   Medication Sig Start Date End Date Taking? Authorizing Provider  acetaminophen (TYLENOL) 500 MG tablet Take 1,000 mg by mouth every 6 (six) hours as needed for mild pain or moderate pain.    Yes [provider]  senna (SENOKOT) 8.6 MG TABS tablet  Take 1 tablet (8.6 mg total) by mouth daily. Patient taking differently: Take 1 tablet by mouth at bedtime as needed for mild constipation.  05/27/16  Yes Veronese, [place], MD  amiodarone (PACERONE) 200 MG tablet Take 200 mg by mouth daily.     [provider]  apixaban (ELIQUIS) 5 MG TABS tablet Take 5 mg by mouth 2 (two) times daily.  08/04/16   [provider]  Calcium Carb-Cholecalciferol (CALCIUM 600-D PO) Take 1 tablet by mouth every morning.     [provider]  Cetirizine HCl 10 MG CAPS Take 1 capsule (10 mg total) by mouth 1 day or 1 dose. Patient taking differently: Take 1 capsule by mouth daily.  05/19/15   Dunn, Ryan M, PA-C  ENTRESTO 24-26 MG TAKE 1 TABLET BY MOUTH TWICE A DAY 08/08/17   Minna Merritts, MD  fluticasone furoate-vilanterol (BREO ELLIPTA) 200-25 MCG/INH AEPB Inhale 1 puff into the lungs daily.    [provider]  furosemide (LASIX) 40 MG tablet Take 1 tablet (40 mg total) by mouth 2 (two) times daily. Patient taking differently: Take 40 mg by mouth 2 (two) times daily. Pt takes bid except she does not take any on Sunday. 05/03/17   Minna Merritts, MD  ipratropium-albuterol (DUONEB) 0.5-2.5 (3) MG/3ML SOLN Take 3 mLs by nebulization every 6 (six) hours as needed.    [provider]  levothyroxine (SYNTHROID, LEVOTHROID) 88 MCG tablet Take 1 tablet (88 mcg total) by mouth daily before breakfast.  08/26/17   Glean Hess, MD  metoprolol succinate (TOPROL-XL) 25 MG 24 hr tablet TAKE 1 TABLET BY MOUTH EVERY MORNING AND 2 TABS IN THE EVENING 08/03/17   Minna Merritts, MD  rosuvastatin (CRESTOR) 5 MG tablet Take 1 tablet (5 mg total) by mouth daily. 10/19/17   Minna Merritts, MD  umeclidinium bromide (INCRUSE ELLIPTA) 62.5 MCG/INH AEPB Inhale 1 puff into the lungs daily. 02/09/17   Flora Lipps, MD      VITAL SIGNS:  Blood pressure 120/83, pulse 60, resp. rate 20, height 5\' 6"  (1.676 m), weight 72.6 kg (160 lb), SpO2 99 %.  PHYSICAL EXAMINATION:  GENERAL:  82 y.o.-year-old patient lying in the bed with no acute distress.  EYES: Pupils equal, round, reactive to light and accommodation. No scleral icterus. Extraocular muscles intact.  HEENT: Head atraumatic, normocephalic. Oropharynx and nasopharynx clear.  NECK:  Supple, no jugular venous distention. No thyroid enlargement, no tenderness.  LUNGS:  decreased breath sounds bilaterally,  positive inspiratory and expiratory wheezing throughout entire lung field.  No rales,rhonchi or crepitation. No use of accessory muscles of respiration.  CARDIOVASCULAR: S1, S2 normal. No murmurs, rubs, or gallops.  ABDOMEN: Soft, nontender, nondistended. Bowel sounds present. No organomegaly or mass.  EXTREMITIES: No pedal edema, cyanosis, or clubbing.  NEUROLOGIC: Cranial nerves II through XII are intact. Muscle strength 5/5 in all extremities. Sensation intact. Gait not checked.  PSYCHIATRIC: The patient is alert and oriented x 3.  SKIN: No rash, lesion, or ulcer.   LABORATORY PANEL:   CBC Recent Labs  Lab 12/11/17 0718  WBC 5.4  HGB 13.0  HCT 38.5  PLT 182   ------------------------------------------------------------------------------------------------------------------  Chemistries  Recent Labs  Lab 12/11/17 0718  NA 135  K 3.9  CL 97*  CO2 30  GLUCOSE 170*  BUN 21*  CREATININE 1.40*  CALCIUM 9.0    ------------------------------------------------------------------------------------------------------------------  Cardiac Enzymes Recent Labs  Lab 12/11/17 0718  TROPONINI <0.03   ------------------------------------------------------------------------------------------------------------------  RADIOLOGY:  Dg Chest Port 1 View  Result Date: 12/11/2017 CLINICAL DATA:  Shortness of breath onset 2 days ago, worsening this morning. EXAM: PORTABLE CHEST 1 VIEW COMPARISON:  Chest x-rays dated 09/13/2017 and 04/06/2017. FINDINGS: Heart size and mediastinal contours are stable. LEFT chest wall pacemaker leads are grossly stable in position, incompletely imaged inferiorly. RIGHT chest wall Port-A-Cath is stable in position with tip at the level of the lower SVC/cavoatrial Stable scarring within the RIGHT perihilar lung. No new lung findings. No pneumothorax seen. IMPRESSION: No active disease.  No evidence of pneumonia or pulmonary edema. Electronically Signed   By: Franki Cabot M.D.   On: 12/11/2017 08:02    EKG:   Atrial paced 67 bpm, incomplete right bundle branch block  IMPRESSION AND PLAN:   1.  Acute hypoxic respiratory failure.  Patient placed on BiPAP in the emergency room.  Admit to the  ICU stepdown.  Case discussed with Dr. Celesta Aver critical care specialist to see the patient in the ICU. 2.  COPD exacerbation.  Start Solu-Medrol, nebulizer treatments with budesonide and DuoNeb. 3.  chronic systolic congestive heart failure.  Continue furosemide and Entresto.  Continue metoprolol. 4.  Paroxysmal atrial fibrillation on Eliquis and amiodarone and metoprolol 5.  Essential hypertension continue usual medications 6.  Hypothyroidism unspecified on levothyroxine 7.  Hyperlipidemia unspecified on Crestor A.  Chronic kidney disease stage III   All the records are reviewed and case discussed with ED provider and critical care specialist. Management plans discussed with the patient,  family and they are in agreement.  CODE STATUS: Full code  TOTAL TIME TAKING CARE OF THIS PATIENT: 50 minutes.    Loletha Grayer M.D on 12/11/2017 at 8:48 AM  Between 7am to 6pm - Pager - [phone]  After 6pm call admission pager [phone]  [facility] Office  [phone]  CC: Primary care physician; Glean Hess, MD

## 2017-12-12 ENCOUNTER — Telehealth: Payer: Self-pay | Admitting: *Deleted

## 2017-12-12 LAB — CBC
HEMATOCRIT: 33.2 % — AB (ref 35.0–47.0)
Hemoglobin: 11.3 g/dL — ABNORMAL LOW (ref 12.0–16.0)
MCH: 32.6 pg (ref 26.0–34.0)
MCHC: 33.9 g/dL (ref 32.0–36.0)
MCV: 96.1 fL (ref 80.0–100.0)
PLATELETS: 142 10*3/uL — AB (ref 150–440)
RBC: 3.46 MIL/uL — ABNORMAL LOW (ref 3.80–5.20)
RDW: 16.1 % — AB (ref 11.5–14.5)
WBC: 3.8 10*3/uL (ref 3.6–11.0)

## 2017-12-12 LAB — BASIC METABOLIC PANEL
Anion gap: 8 (ref 5–15)
BUN: 24 mg/dL — AB (ref 6–20)
CHLORIDE: 97 mmol/L — AB (ref 101–111)
CO2: 29 mmol/L (ref 22–32)
CREATININE: 1.28 mg/dL — AB (ref 0.44–1.00)
Calcium: 8.6 mg/dL — ABNORMAL LOW (ref 8.9–10.3)
GFR calc Af Amer: 45 mL/min — ABNORMAL LOW (ref >60)
GFR calc non Af Amer: 39 mL/min — ABNORMAL LOW (ref >60)
Glucose, Bld: 176 mg/dL — ABNORMAL HIGH (ref 65–99)
POTASSIUM: 3.7 mmol/L (ref 3.5–5.1)
Sodium: 134 mmol/L — ABNORMAL LOW (ref 135–145)

## 2017-12-12 MED ORDER — GUAIFENESIN-DM 100-10 MG/5ML PO SYRP
5.0000 mL | ORAL_SOLUTION | ORAL | Status: DC | PRN
  Administered 2017-12-12 – 2017-12-13 (×4): 5 mL via ORAL
  Filled 2017-12-12 (×6): qty 5

## 2017-12-12 NOTE — Telephone Encounter (Signed)
Dr. Jacinto Reap- patient is in the hospital

## 2017-12-12 NOTE — Telephone Encounter (Signed)
-----   Message from Clancy Gourd sent at 12/12/2017  1:19 PM EDT ----- Regarding: Pt in Hospital/Cancelled Appt Contact: [phone] Pt's daughter called to report that pt is unable to make appt due to her being in the hospital and requested to cancel appt. Daughter inquired if Dr. Rogue Bussing would be coming over to see pt in the hospital. Advised I would send this message and have someone from his team call her back.

## 2017-12-12 NOTE — Progress Notes (Signed)
Patient ID: Jenny Cooper, female   DOB: [birth year], 82 y.o.   MRN: [id number]  [facility] PROGRESS NOTE  Jenny Cooper KGU:[id number] DOB: [birth year] DOA: 12/11/2017 PCP: Jenny Hess, MD  HPI/Subjective: Patient feeling better than when she came in.  Breathing a little bit better.  Still coughing a lot.  Hurts under her ribs.  Objective: Vitals:   12/12/17 1200 12/12/17 1400  BP: (!) 109/56 (!) 99/50  Pulse: (!) 54 60  Resp: 16 17  Temp:    SpO2: 93% 96%    Filed Weights   12/11/17 0707 12/11/17 0947  Weight: 72.6 kg (160 lb) 73.9 kg (162 lb 14.7 oz)    ROS: Review of Systems  Constitutional: Negative for chills and fever.  Eyes: Negative for blurred vision.  Respiratory: Positive for cough, shortness of breath and wheezing.   Cardiovascular: Negative for chest pain.  Gastrointestinal: Negative for abdominal pain, constipation, diarrhea, nausea and vomiting.  Genitourinary: Negative for dysuria.  Musculoskeletal: Negative for joint pain.  Neurological: Negative for dizziness and headaches.   Exam: Physical Exam  HENT:  Nose: No mucosal edema.  Mouth/Throat: No oropharyngeal exudate or posterior oropharyngeal edema.  Eyes: Pupils are equal, round, and reactive to light. Conjunctivae, EOM and lids are normal.  Neck: No JVD present. Carotid bruit is not present. No edema present. No thyroid mass and no thyromegaly present.  Cardiovascular: S1 normal and S2 normal. Exam reveals no gallop.  No murmur heard. Pulses:      Dorsalis pedis pulses are 2+ on the right side, and 2+ on the left side.  Respiratory: No respiratory distress. She has decreased breath sounds in the right middle field, the right lower field, the left middle field and the left lower field. She has wheezes in the right middle field, the right lower field and the left middle field. She has no rhonchi. She has no rales.  GI: Soft. Bowel sounds are normal. There is no tenderness.   Musculoskeletal:       Right shoulder: She exhibits no swelling.  Lymphadenopathy:    She has no cervical adenopathy.  Neurological: She is alert. No cranial nerve deficit.  Skin: Skin is warm. No rash noted. Nails show no clubbing.  Psychiatric: She has a normal mood and affect.      Data Reviewed: Basic Metabolic Panel: Recent Labs  Lab 12/11/17 0718 12/12/17 0507  NA 135 134*  K 3.9 3.7  CL 97* 97*  CO2 30 29  GLUCOSE 170* 176*  BUN 21* 24*  CREATININE 1.40* 1.28*  CALCIUM 9.0 8.6*   CBC: Recent Labs  Lab 12/11/17 0718 12/12/17 0507  WBC 5.4 3.8  NEUTROABS 3.9  --   HGB 13.0 11.3*  HCT 38.5 33.2*  MCV 95.6 96.1  PLT 182 142*   Cardiac Enzymes: Recent Labs  Lab 12/11/17 0718  TROPONINI <0.03   BNP (last 3 results) Recent Labs    04/06/17 1057 12/11/17 0718  BNP 1,022.0* 900.0*    CBG: Recent Labs  Lab 12/11/17 0948  GLUCAP 141*    Recent Results (from the past 240 hour(s))  MRSA PCR Screening     Status: None   Collection Time: 12/11/17  9:50 AM  Result Value Ref Range Status   MRSA by PCR NEGATIVE NEGATIVE Final    Comment:        The GeneXpert MRSA Assay (FDA approved for NASAL specimens only), is one component of a comprehensive MRSA colonization surveillance program. It  is not intended to diagnose MRSA infection nor to guide or monitor treatment for MRSA infections. Performed at [facility], [address], [place], [place] [address]      Studies: Dg Chest [facility] 1 View  Result Date: 12/11/2017 CLINICAL DATA:  Shortness of breath onset 2 days ago, worsening this morning. EXAM: PORTABLE CHEST 1 VIEW COMPARISON:  Chest x-rays dated 09/13/2017 and 04/06/2017. FINDINGS: Heart size and mediastinal contours are stable. LEFT chest wall pacemaker leads are grossly stable in position, incompletely imaged inferiorly. RIGHT chest wall Port-A-Cath is stable in position with tip at the level of the lower SVC/cavoatrial Stable  scarring within the RIGHT perihilar lung. No new lung findings. No pneumothorax seen. IMPRESSION: No active disease.  No evidence of pneumonia or pulmonary edema. Electronically Signed   By: Franki Cabot M.D.   On: 12/11/2017 08:02    Scheduled Meds: . amiodarone  200 mg Oral Daily  . apixaban  5 mg Oral BID  . budesonide (PULMICORT) nebulizer solution  0.5 mg Nebulization BID  . doxycycline  100 mg Oral Q12H  . furosemide  40 mg Oral BID  . ipratropium-albuterol  3 mL Nebulization Q6H  . levothyroxine  88 mcg Oral QAC breakfast  . loratadine  10 mg Oral Daily  . mouth rinse  15 mL Mouth Rinse BID  . methylPREDNISolone (SOLU-MEDROL) injection  40 mg Intravenous Q12H  . metoprolol succinate  25 mg Oral Daily  . metoprolol succinate  50 mg Oral QHS  . rosuvastatin  5 mg Oral Daily  . sacubitril-valsartan  1 tablet Oral BID   Continuous Infusions:  Assessment/Plan:  1. Acute hypoxic respiratory failure.  Patient placed on BiPAP in the emergency room.  Patient on nasal cannula at this point. 2. COPD exacerbation.  Continue Solu-Medrol, nebulizer treatments with budesonide and DuoNeb. 3. Chronic systolic congestive heart failure continue oral Lasix Entresto and metoprolol as long as blood pressure allows. 4. Relative hypotension.  Continue usual medications if able to do so 5. Hypothyroidism unspecified on levothyroxine 6. Hyperlipidemia unspecified on Crestor 7. Chronic kidney disease stage III. 8. History of paroxysmal atrial fibrillation on Eliquis and amiodarone  Code Status:     Code Status Orders  (From admission, onward)        Start     Ordered   12/11/17 0847  Full code  Continuous     12/11/17 0846    Code Status History    Date Active Date Inactive Code Status Order ID Comments User Context   04/06/2017 1714 04/08/2017 1958 Full Code [id number]  Demetrios Loll, MD Inpatient   10/06/2015 1627 10/07/2015 1747 Full Code [id number]  Vaughan Basta, MD Inpatient    04/28/2015 1335 04/28/2015 1858 Full Code [id number]  Wellington Hampshire, MD Inpatient   04/01/2015 1419 04/07/2015 1736 Full Code [id number]  Max Sane, MD Inpatient    Advance Directive Documentation     Most Recent Value  Type of Advance Directive  Healthcare Power of Attorney, Living will  Pre-existing out of facility DNR order (yellow form or pink MOST form)  -  "MOST" Form in Place?  -     Family Communication: Daughter at the bedside Disposition Plan: Be transferred out of the ICU today  Time spent: 28 minutes  [place]

## 2017-12-12 NOTE — Progress Notes (Signed)
VS reviewed Resp status stable  Transfer to gen med floor   Juston Goheen Patricia Pesa, M.D.  [facility]  Medical Director [place] Director [facility] Cardio-Pulmonary Department

## 2017-12-13 ENCOUNTER — Telehealth: Payer: Self-pay | Admitting: Internal Medicine

## 2017-12-13 ENCOUNTER — Other Ambulatory Visit: Payer: Medicare Other

## 2017-12-13 ENCOUNTER — Ambulatory Visit: Payer: Medicare Other | Admitting: Internal Medicine

## 2017-12-13 DIAGNOSIS — J9601 Acute respiratory failure with hypoxia: Secondary | ICD-10-CM

## 2017-12-13 NOTE — Progress Notes (Signed)
Patient ID: ANEVAY Cooper, female   DOB: 09-09-1935, 82 y.o.   MRN: [id number]  [facility] PROGRESS NOTE  Jenny Cooper EQA:[id number] DOB: [birth year] DOA: 12/11/2017 PCP: Glean Hess, MD  HPI/Subjective: Patient feeling a little bit better.  States still little short of breath when she moves around.  Little bit of a cough.  Objective: Vitals:   12/13/17 0730 12/13/17 0854  BP:  112/78  Pulse: 62 62  Resp: 18 17  Temp:  97.6 F (36.4 C)  SpO2: 94% 96%    Filed Weights   12/11/17 0707 12/11/17 0947 12/13/17 0253  Weight: 72.6 kg (160 lb) 73.9 kg (162 lb 14.7 oz) 72.4 kg (159 lb 11.2 oz)    ROS: Review of Systems  Constitutional: Negative for chills and fever.  Eyes: Negative for blurred vision.  Respiratory: Positive for cough, shortness of breath and wheezing.   Cardiovascular: Negative for chest pain.  Gastrointestinal: Negative for abdominal pain, constipation, diarrhea, nausea and vomiting.  Genitourinary: Negative for dysuria.  Musculoskeletal: Negative for joint pain.  Neurological: Negative for dizziness and headaches.   Exam: Physical Exam  HENT:  Nose: No mucosal edema.  Mouth/Throat: No oropharyngeal exudate or posterior oropharyngeal edema.  Eyes: Pupils are equal, round, and reactive to light. Conjunctivae, EOM and lids are normal.  Neck: No JVD present. Carotid bruit is not present. No edema present. No thyroid mass and no thyromegaly present.  Cardiovascular: S1 normal and S2 normal. Exam reveals no gallop.  No murmur heard. Pulses:      Dorsalis pedis pulses are 2+ on the right side, and 2+ on the left side.  Respiratory: No respiratory distress. She has decreased breath sounds in the right lower field, the left middle field and the left lower field. She has wheezes in the right lower field and the left lower field. She has no rhonchi. She has no rales.  GI: Soft. Bowel sounds are normal. There is no tenderness.  Musculoskeletal:   Right shoulder: She exhibits no swelling.  Lymphadenopathy:    She has no cervical adenopathy.  Neurological: She is alert. No cranial nerve deficit.  Skin: Skin is warm. No rash noted. Nails show no clubbing.  Psychiatric: She has a normal mood and affect.      Data Reviewed: Basic Metabolic Panel: Recent Labs  Lab 12/11/17 0718 12/12/17 0507  NA 135 134*  K 3.9 3.7  CL 97* 97*  CO2 30 29  GLUCOSE 170* 176*  BUN 21* 24*  CREATININE 1.40* 1.28*  CALCIUM 9.0 8.6*   CBC: Recent Labs  Lab 12/11/17 0718 12/12/17 0507  WBC 5.4 3.8  NEUTROABS 3.9  --   HGB 13.0 11.3*  HCT 38.5 33.2*  MCV 95.6 96.1  PLT 182 142*   Cardiac Enzymes: Recent Labs  Lab 12/11/17 0718  TROPONINI <0.03   BNP (last 3 results) Recent Labs    04/06/17 1057 12/11/17 0718  BNP 1,022.0* 900.0*    CBG: Recent Labs  Lab 12/11/17 0948  GLUCAP 141*    Recent Results (from the past 240 hour(s))  MRSA PCR Screening     Status: None   Collection Time: 12/11/17  9:50 AM  Result Value Ref Range Status   MRSA by PCR NEGATIVE NEGATIVE Final    Comment:        The GeneXpert MRSA Assay (FDA approved for NASAL specimens only), is one component of a comprehensive MRSA colonization surveillance program. It is not intended to diagnose MRSA  infection nor to guide or monitor treatment for MRSA infections. Performed at [facility], [address] [place], [place] [address]      Scheduled Meds: . amiodarone  200 mg Oral Daily  . apixaban  5 mg Oral BID  . budesonide (PULMICORT) nebulizer solution  0.5 mg Nebulization BID  . doxycycline  100 mg Oral Q12H  . furosemide  40 mg Oral BID  . ipratropium-albuterol  3 mL Nebulization Q6H  . levothyroxine  88 mcg Oral QAC breakfast  . loratadine  10 mg Oral Daily  . mouth rinse  15 mL Mouth Rinse BID  . methylPREDNISolone (SOLU-MEDROL) injection  40 mg Intravenous Q12H  . metoprolol succinate  25 mg Oral Daily  . metoprolol succinate   50 mg Oral QHS  . rosuvastatin  5 mg Oral Daily  . sacubitril-valsartan  1 tablet Oral BID   Continuous Infusions:  Assessment/Plan:  1. Acute on chronic hypoxic respiratory failure.  Patient placed on BiPAP in the emergency room.  Patient switch back to her normal 2 L of oxygen. 2. COPD exacerbation.  Continue Solu-Medrol, nebulizer treatments with budesonide and DuoNeb.  Reevaluate the lungs tomorrow to see how they sound. 3. Chronic systolic congestive heart failure continue oral Lasix, Entresto and metoprolol. 4. History of hypertension.  Blood pressure up a little bit today than yesterday. 5. Hypothyroidism unspecified on levothyroxine 6. Hyperlipidemia unspecified on Crestor 7. Chronic kidney disease stage III. 8. History of paroxysmal atrial fibrillation on Eliquis and amiodarone  Code Status:     Code Status Orders  (From admission, onward)        Start     Ordered   12/11/17 0847  Full code  Continuous     12/11/17 0846    Code Status History    Date Active Date Inactive Code Status Order ID Comments User Context   04/06/2017 1714 04/08/2017 1958 Full Code [id number]  Demetrios Loll, MD Inpatient   10/06/2015 1627 10/07/2015 1747 Full Code [id number]  Jenny Basta, MD Inpatient   04/28/2015 1335 04/28/2015 1858 Full Code [id number]  Jenny Hampshire, MD Inpatient   04/01/2015 1419 04/07/2015 1736 Full Code [id number]  Jenny Sane, MD Inpatient    Advance Directive Documentation     Most Recent Value  Type of Advance Directive  Healthcare Power of Attorney, Living will  Pre-existing out of facility DNR order (yellow form or pink MOST form)  -  "MOST" Form in Place?  -     Family Communication: Daughter at the bedside Disposition Plan: Evaluate on a daily basis on when to go home.  Early as potential will be tomorrow.  Time spent: 27 minutes  Claude

## 2017-12-13 NOTE — Progress Notes (Signed)
* [facility]     Assessment and Plan:  Acute on chronic hypoxic respiratory failure with acute exacerbation of COPD.. Stage IIIb lung cancer. Chronic systolic congestive heart failure  -Wean down steroids. - Diuresis per cardiology. -Wean oxygen, advance activity as tolerated.  Can likely DC from respiratory standpoint, follow up outpatient in 2-4 weeks.    Date: 12/13/2017  MRN# [id number] Jenny Cooper [birth year]   Jenny Cooper is a 82 y.o. old female seen in follow up for chief complaint of  Chief Complaint  Patient presents with  . Shortness of Breath     HPI:  Patient is 82 year old female who follows with Dr. Mortimer Fries in the office.  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  Admitted on 4/14 for hypoxic rotatory failure thought to be secondary to acute COPD exacerbation.  Patient also has a history of chronic systolic congestive heart failure, known ejection fraction of 30% on echocardiogram on 04/06/17.  Patient appears to be doing better today, she has been weaned down to her home oxygen level of 2 L nasal cannula, she remains on doxycycline, Solu-Medrol 40 mg every 12 hours.     Medication:    Current Facility-Administered Medications:  .  acetaminophen (TYLENOL) tablet 650 mg, 650 mg, Oral, Q6H PRN, 650 mg at 12/13/17 0119 **OR** acetaminophen (TYLENOL) suppository 650 mg, 650 mg, Rectal, Q6H PRN, Wieting, Richard, MD .  amiodarone (PACERONE) tablet 200 mg, 200 mg, Oral, Daily, Leslye Peer, Richard, MD, 200 mg at 12/13/17 0857 .  apixaban (ELIQUIS) tablet 5 mg, 5 mg, Oral, BID, Leslye Peer, Richard, MD, 5 mg at 12/13/17 0857 .  budesonide (PULMICORT) nebulizer solution 0.5 mg, 0.5 mg, Nebulization, BID, Leslye Peer, Richard, MD, 0.5 mg at 12/13/17 0729 .  doxycycline (VIBRA-TABS)  tablet 100 mg, 100 mg, Oral, Q12H, Wieting, Richard, MD, 100 mg at 12/13/17 0857 .  furosemide (LASIX) tablet 40 mg, 40 mg, Oral, BID, Leslye Peer, Richard, MD, 40 mg at 12/13/17 0858 .  guaiFENesin-dextromethorphan (ROBITUSSIN DM) 100-10 MG/5ML syrup 5 mL, 5 mL, Oral, Q4H PRN, Flora Lipps, MD, 5 mL at 12/13/17 0122 .  ipratropium-albuterol (DUONEB) 0.5-2.5 (3) MG/3ML nebulizer solution 3 mL, 3 mL, Nebulization, Q6H, Wieting, Richard, MD, 3 mL at 12/13/17 0729 .  levothyroxine (SYNTHROID, LEVOTHROID) tablet 88 mcg, 88 mcg, Oral, QAC breakfast, Loletha Grayer, MD, 88 mcg at 12/13/17 0858 .  loratadine (CLARITIN) tablet 10 mg, 10 mg, Oral, Daily, Leslye Peer, Richard, MD, 10 mg at 12/13/17 0857 .  MEDLINE mouth rinse, 15 mL, Mouth Rinse, BID, Lafayette Dragon, MD, 15 mL at 12/12/17 0930 .  methylPREDNISolone sodium succinate (SOLU-MEDROL) 40 mg/mL injection 40 mg, 40 mg, Intravenous, Q12H, Loletha Grayer, MD, 40 mg at 12/13/17 0858 .  metoprolol succinate (TOPROL-XL) 24 hr tablet 25 mg, 25 mg, Oral, Daily, Leslye Peer, Richard, MD, 25 mg at 12/13/17 0858 .  metoprolol succinate (TOPROL-XL) 24 hr tablet 50 mg, 50 mg, Oral, QHS, Wieting, Richard, MD, 50 mg at 12/12/17 2138 .  ondansetron (ZOFRAN) tablet 4 mg, 4 mg, Oral, Q6H PRN **OR** ondansetron (ZOFRAN) injection 4 mg, 4 mg, Intravenous, Q6H PRN, Wieting, Richard, MD .  rosuvastatin (CRESTOR) tablet 5 mg, 5 mg, Oral, Daily, Leslye Peer, Richard, MD, 5 mg at 12/13/17 0857 .  sacubitril-valsartan (ENTRESTO) 24-26 mg per tablet, 1 tablet, Oral, BID, Loletha Grayer, MD, 1  tablet at 12/12/17 2139 .  senna (SENOKOT) tablet 8.6 mg, 1 tablet, Oral, QHS PRN, Leslye Peer, Richard, MD, 8.6 mg at 12/13/17 4193  Facility-Administered Medications Ordered in Other Encounters:  .  sodium chloride 0.9 % injection 10 mL, 10 mL, Intravenous, PRN, Choksi, Janak, MD, 10 mL at 02/19/15 1000 .  sodium chloride flush (NS) 0.9 % injection 10 mL, 10 mL, Intravenous, PRN, Cammie Sickle, MD, 10 mL at 02/16/16 1048   Allergies:  Lovenox [enoxaparin sodium] and Meperidine  Review of Systems: Gen:  Denies  fever, sweats. HEENT: Denies blurred vision. Cvc:  No dizziness, chest pain or heaviness Resp:   Denies cough or sputum porduction. Gi: Denies swallowing difficulty, stomach pain. constipation, bowel incontinence Gu:  Denies bladder incontinence, burning urine Ext:   No Joint pain, stiffness. Skin: No skin rash, easy bruising. Endoc:  No polyuria, polydipsia. Psych: No depression, insomnia. Other:  All other systems were reviewed and found to be negative other than what is mentioned in the HPI.   Physical Examination:   VS: BP 112/78 (BP Location: Right Arm)   Pulse 62   Temp 97.6 F (36.4 C) (Oral)   Resp 17   Ht 5\' 6"  (1.676 m)   Wt 159 lb 11.2 oz (72.4 kg)   SpO2 96%   BMI 25.78 kg/m    General Appearance: No distress  Neuro:without focal findings,  speech normal,  HEENT: PERRLA, EOM intact. Pulmonary: normal breath sounds, No wheezing.   CardiovascularNormal S1,S2.  No m/r/g.   Abdomen: Benign, Soft, non-tender. Renal:  No costovertebral tenderness  GU:  Not performed at this time. Endoc: No evident thyromegaly, no signs of acromegaly. Skin:   warm, no rash. Extremities: normal, no cyanosis, clubbing.   LABORATORY PANEL:   CBC Recent Labs  Lab 12/12/17 0507  WBC 3.8  HGB 11.3*  HCT 33.2*  PLT 142*   ------------------------------------------------------------------------------------------------------------------  Chemistries  Recent Labs  Lab 12/12/17 0507  NA 134*  K 3.7  CL 97*  CO2 29  GLUCOSE 176*  BUN 24*  CREATININE 1.28*  CALCIUM 8.6*   ------------------------------------------------------------------------------------------------------------------  Cardiac Enzymes Recent Labs  Lab 12/11/17 0718  TROPONINI <0.03   ------------------------------------------------------------  RADIOLOGY:   No  results found for this or any previous visit. Results for orders placed during the hospital encounter of 09/13/17  DG Chest 2 View   Narrative CLINICAL DATA:  Shortness of breath. History of right-sided lung cancer status post chemotherapy and radiation.  EXAM: CHEST  2 VIEW  COMPARISON:  Chest x-ray dated April 06, 2017.  FINDINGS: Left chest wall pacer device in unchanged position. Unchanged right chest wall port catheter with the tip at the cavoatrial junction.  The heart size and mediastinal contours are within normal limits. Prior median sternotomy and mitral valve replacement. Normal pulmonary vascularity. Unchanged scarring in the right hilum and perihilar right lung. Unchanged blunting of the right costophrenic angle. No focal consolidation, pleural effusion, or pneumothorax. No acute osseous abnormality. 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  IMPRESSION: Stable scarring in the right hilum and perihilar right lung. No active cardiopulmonary disease.   Electronically Signed   By: Titus Dubin M.D.   On: 09/13/2017 12:48    ------------------------------------------------------------------------------------------------------------------  Thank  you for allowing [facility] to assist in the care of your patient. Our recommendations are noted above.  Please contact us if we can be of further service.   Deep Ashby Dawes,  MD.  [facility] Office Number: [phone]  Patricia Pesa, M.D.  Merton Border, M.D  12/13/2017

## 2017-12-13 NOTE — Telephone Encounter (Signed)
Spoke to patient over the phone this morning; patient states that she is doing better. Will check on her tomorrow morning.

## 2017-12-14 MED ORDER — DEXAMETHASONE 2 MG PO TABS: ORAL_TABLET | ORAL | 0 refills | Status: DC

## 2017-12-14 MED ORDER — PREDNISONE 10 MG PO TABS: ORAL_TABLET | ORAL | 0 refills | Status: DC

## 2017-12-14 MED ORDER — DOXYCYCLINE HYCLATE 100 MG PO TABS: 100.0000 mg | ORAL_TABLET | Freq: Two times a day (BID) | ORAL | 0 refills | Status: DC

## 2017-12-14 MED ORDER — CETIRIZINE HCL 10 MG PO CAPS: 1.0000 | ORAL_CAPSULE | Freq: Every day | ORAL | Status: DC

## 2017-12-14 MED ORDER — FUROSEMIDE 40 MG PO TABS: 40.0000 mg | ORAL_TABLET | Freq: Two times a day (BID) | ORAL | Status: DC

## 2017-12-14 NOTE — Care Management (Signed)
Patient has chronic oxygen through [employer].  She declines need for home health nurse. Denies issues accessing medical care, paying for medication or with transportation that would prevent her from keeping doctor appointments.

## 2017-12-14 NOTE — Care Management Important Message (Signed)
Important Message  Patient Details  Name: Jenny Cooper MRN: [id number] Date of Birth: 1935-12-06   Medicare Important Message Given:  Yes    Katrina Stack, RN 12/14/2017, 9:17 AM

## 2017-12-14 NOTE — Consult Note (Signed)
   [facility] Inpatient Consult   12/14/2017  BLEN RANSOME 16-Dec-1935 [id number]    Patient discharged before this liaison was able to see patient. Referral sent back to [facility] main office to have [facility] reach out to patient at home. For questions please feel free to reach out with questions.   Rutherford Limerick RN, [facility] Liaison  [phone]) Business Mobile [phone]) Toll free office

## 2017-12-14 NOTE — Progress Notes (Signed)
Discharge instructions along with home medications and follow up gone over with patient and daughter. Both verbalize that they understood instructions. No prescriptions given to patient. IV's and tele removed. Pt being discharged home on 3L 02, no distress noted.

## 2017-12-14 NOTE — Discharge Summary (Signed)
[facility] [place] NAME: Jenny Cooper    MR#:  [id number]  DATE OF BIRTH:  Dec 06, 1935  DATE OF ADMISSION:  12/11/2017 ADMITTING PHYSICIAN: Loletha Grayer, MD  DATE OF DISCHARGE: 12/14/2017 12:40 PM  PRIMARY CARE PHYSICIAN: Glean Hess, MD    ADMISSION DIAGNOSIS:  Acute respiratory failure with hypoxia ([place]) [J96.01]  DISCHARGE DIAGNOSIS:  Active Problems:   Acute respiratory failure with hypoxia ([place])   SECONDARY DIAGNOSIS:   Past Medical History:  Diagnosis Date  . Atypical atrial flutter ([place])    a. s/p ablation 07/27/2013 followed by Dr. Rockey Situ  . CAD (coronary artery disease)    a. s/p MI x 2 in 2002 s/p PCI x 2 in 2002; b. s/p 2v CABG 2002; c. stress echo 07/2004 w/ evi of pos & inf infarct & no evi of ischemia; d. 4/08 dipyridamole scan w/ multiple areas of infarct, no ischemia, EF 49%; e. cath 04/28/15 3v CAD, med Rx rec, no targets for revasc, LM lum irregs, pLAD 30%, 100%, ost-pLCx 60%, [name] 99%, OM2 100%, p-mRCA 90%, [name] 100% L-R collats, VG-mLAD irregs, VG-OM2 oc  . Carcinoma of right lung ([place]) 01/03/2015   a. followed by Dr. Oliva Bustard  . Chronic systolic CHF (congestive heart failure) ([place])    a. echo 03/2015: EF 30-35%, sev ant/inf/pos HK, in mild to mod MR  . Complication of anesthesia    more recently patient oxygen levels do not rebound as quickly  . Compressed spine fracture ([place]) 08/06/2016   lumbar 2, t11, t12  . COPD (chronic obstructive pulmonary disease) ([place])   . Fractured pelvis ([address]) 05/26/2016   2 places  . GERD (gastroesophageal reflux disease)   . History of blood clots    12/2001  . History of colonoscopy 2013  . History of mammography, screening 2015  . History of Papanicolaou smear of cervix 2013  . HLD (hyperlipidemia)   . HTN (hypertension)   . Hypothyroidism   . Lung cancer ([place])   . Mitral regurgitation    a. s/p mitral ring placement 09/2000; b. echo 09/2010: EF 50%, inf HK, post AK,  mild MR, prosthetic mitral valve ring w/ peak gradient of 10 mmHg; b. echo 2/13: EF 50%, mild MR/TR     . Myocardial infarction ([place])    X 2 (LAST ONE IN 2002)  . Neuropathy   . Pacemaker    a. MDT 2002; b. generator replacement 2013; c. followed by Dr. Omelia Blackwater, MD  . PAF (paroxysmal atrial fibrillation) ([place])    a. on Eliquis   . Personal history of chemotherapy   . Personal history of radiation therapy     HOSPITAL COURSE:   1.  Acute on chronic hypoxic respiratory failure.  The patient was initially placed on BiPAP in the emergency room.  She was admitted to the stepdown unit.  She improved and was taken off BiPAP.  She was placed back on her chronic oxygen. 2.  COPD exacerbation.  The patient was placed on Solu-Medrol, nebulizer treatments with budesonide and DuoNeb.  The patient improved.  Upon discharge her lungs were clear when she was breathing through her nose.  When she breathes through her mouth there was some upper airway noises that were transmitted.  Finish empiric doxycycline.  Patient has inhalers and nebulizers at home.  The patient has an allergy to prednisone so dexamethasone taper was sent into pharmacy. 3.  Chronic systolic congestive heart failure on oral Lasix, Entresto and  metoprolol. 4.  Hypertension.  Continue Lasix and metoprolol 5.  Hypothyroidism unspecified on levothyroxine 6.  Hyperlipidemia unspecified on Crestor 7.  Chronic kidney disease stage III.  Follow-up as outpatient 8.  History of paroxysmal atrial fibrillation on Eliquis and amiodarone 9.  Elevated sugars secondary to steroids  DISCHARGE CONDITIONS:   Satisfactory  CONSULTS OBTAINED:  Seen by critical care specialist in ICU  DRUG ALLERGIES:   Allergies  Allergen Reactions  . Lovenox [Enoxaparin Sodium] Itching  . Meperidine Other (See Comments)    Other Reaction: pt does not like how it makes her feel    DISCHARGE MEDICATIONS:   Allergies as of 12/14/2017      Reactions   Lovenox  [enoxaparin Sodium] Itching   Meperidine Other (See Comments)   Other Reaction: pt does not like how it makes her feel      Medication List    TAKE these medications   acetaminophen 500 MG tablet Commonly known as:  TYLENOL Take 1,000 mg by mouth every 6 (six) hours as needed for mild pain or moderate pain.   amiodarone 200 MG tablet Commonly known as:  PACERONE Take 200 mg by mouth daily.   apixaban 5 MG Tabs tablet Commonly known as:  ELIQUIS Take 5 mg by mouth 2 (two) times daily.   BREO ELLIPTA 200-25 MCG/INH Aepb Generic drug:  fluticasone furoate-vilanterol Inhale 1 puff into the lungs daily.   CALCIUM 600-D PO Take 1 tablet by mouth every morning.   Cetirizine HCl 10 MG Caps Take 1 capsule (10 mg total) by mouth daily.   dexamethasone 2 MG tablet Commonly known as:  DECADRON Three tabs po day 1; 2 tabs po day2; 1 tab po day 3 then stop   doxycycline 100 MG tablet Commonly known as:  VIBRA-TABS Take 1 tablet (100 mg total) by mouth every 12 (twelve) hours.   ENTRESTO 24-26 MG Generic drug:  sacubitril-valsartan TAKE 1 TABLET BY MOUTH TWICE A DAY   furosemide 40 MG tablet Commonly known as:  LASIX Take 1 tablet (40 mg total) by mouth 2 (two) times daily. What changed:  additional instructions   ipratropium-albuterol 0.5-2.5 (3) MG/3ML Soln Commonly known as:  DUONEB Take 3 mLs by nebulization every 6 (six) hours as needed (shortness of breath and/or wheezing).   levothyroxine 88 MCG tablet Commonly known as:  SYNTHROID, LEVOTHROID Take 1 tablet (88 mcg total) by mouth daily before breakfast.   metoprolol succinate 25 MG 24 hr tablet Commonly known as:  TOPROL-XL TAKE 1 TABLET BY MOUTH EVERY MORNING AND 2 TABS IN THE EVENING   rosuvastatin 5 MG tablet Commonly known as:  CRESTOR Take 1 tablet (5 mg total) by mouth daily.   senna 8.6 MG Tabs tablet Commonly known as:  SENOKOT Take 1 tablet (8.6 mg total) by mouth daily. What changed:    when to  take this  reasons to take this   umeclidinium bromide 62.5 MCG/INH Aepb Commonly known as:  INCRUSE ELLIPTA Inhale 1 puff into the lungs daily.        DISCHARGE INSTRUCTIONS:   Follow-up PMD 6 days  If you experience worsening of your admission symptoms, develop shortness of breath, life threatening emergency, suicidal or homicidal thoughts you must seek medical attention immediately by calling 911 or calling your MD immediately  if symptoms less severe.  You Must read complete instructions/literature along with all the possible adverse reactions/side effects for all the Medicines you take and that have been prescribed to  you. Take any new Medicines after you have completely understood and accept all the possible adverse reactions/side effects.   Please note  You were cared for by a hospitalist during your hospital stay. If you have any questions about your discharge medications or the care you received while you were in the hospital after you are discharged, you can call the unit and asked to speak with the hospitalist on call if the hospitalist that took care of you is not available. Once you are discharged, your primary care physician will handle any further medical issues. Please note that NO REFILLS for any discharge medications will be authorized once you are discharged, as it is imperative that you return to your primary care physician (or establish a relationship with a primary care physician if you do not have one) for your aftercare needs so that they can reassess your need for medications and monitor your lab values.    Today   CHIEF COMPLAINT:   Chief Complaint  Patient presents with  . Shortness of Breath    HISTORY OF PRESENT ILLNESS:  Jenny Cooper  is a 82 y.o. female with a known history of COPD and chronic systolic heart failure presented with respiratory distress   VITAL SIGNS:  Blood pressure (!) 129/59, pulse 60, temperature 97.6 F (36.4 C),  temperature source Oral, resp. rate 15, height 5\' 6"  (1.676 m), weight 72.4 kg (159 lb 11.2 oz), SpO2 93 %.  PHYSICAL EXAMINATION:  GENERAL:  82 y.o.-year-old patient lying in the bed with no acute distress.  EYES: Pupils equal, round, reactive to light and accommodation. No scleral icterus. Extraocular muscles intact.  HEENT: Head atraumatic, normocephalic. Oropharynx and nasopharynx clear.  NECK:  Supple, no jugular venous distention. No thyroid enlargement, no tenderness.  LUNGS:  decreased breath sounds bilaterally, no wheezing when breathing through her nose.  Transmitted upper airway sounds when breathing through her mouth.  No  rales,rhonchi or crepitation. No use of accessory muscles of respiration.  CARDIOVASCULAR: S1, S2 normal. No murmurs, rubs, or gallops.  ABDOMEN: Soft, non-tender, non-distended. Bowel sounds present. No organomegaly or mass.  EXTREMITIES: No pedal edema, cyanosis, or clubbing.  NEUROLOGIC: Cranial nerves II through XII are intact. Muscle strength 5/5 in all extremities. Sensation intact. Gait not checked.  PSYCHIATRIC: The patient is alert and oriented x 3.  SKIN: No obvious rash, lesion, or ulcer.   DATA REVIEW:   CBC Recent Labs  Lab 12/12/17 0507  WBC 3.8  HGB 11.3*  HCT 33.2*  PLT 142*    Chemistries  Recent Labs  Lab 12/12/17 0507  NA 134*  K 3.7  CL 97*  CO2 29  GLUCOSE 176*  BUN 24*  CREATININE 1.28*  CALCIUM 8.6*    Cardiac Enzymes Recent Labs  Lab 12/11/17 0718  TROPONINI <0.03    Microbiology Results  Results for orders placed or performed during the hospital encounter of 12/11/17  MRSA PCR Screening     Status: None   Collection Time: 12/11/17  9:50 AM  Result Value Ref Range Status   MRSA by PCR NEGATIVE NEGATIVE Final    Comment:        The GeneXpert MRSA Assay (FDA approved for NASAL specimens only), is one component of a comprehensive MRSA colonization surveillance program. It is not intended to diagnose  MRSA infection nor to guide or monitor treatment for MRSA infections. Performed at [facility], [address], [place], [place] [address]       Management  plans discussed with the patient, family and they are in agreement.  CODE STATUS:     Code Status Orders  (From admission, onward)        Start     Ordered   12/11/17 0847  Full code  Continuous     12/11/17 0846    Code Status History    Date Active Date Inactive Code Status Order ID Comments User Context   04/06/2017 1714 04/08/2017 1958 Full Code [id number]  Demetrios Loll, MD Inpatient   10/06/2015 1627 10/07/2015 1747 Full Code [id number]  Vaughan Basta, MD Inpatient   04/28/2015 1335 04/28/2015 1858 Full Code [id number]  Wellington Hampshire, MD Inpatient   04/01/2015 1419 04/07/2015 1736 Full Code [id number]  Max Sane, MD Inpatient    Advance Directive Documentation     Most Recent Value  Type of Advance Directive  Healthcare Power of Startup, Living will  Pre-existing out of facility DNR order (yellow form or pink MOST form)  -  "MOST" Form in Place?  -      TOTAL TIME TAKING CARE OF THIS PATIENT: 35 minutes.    Loletha Grayer M.D on 12/14/2017 at 1:18 PM  Between 7am to 6pm - Pager - [phone]  After 6pm go to [email or web address] - password [email or web address] [facility]  [phone]  CC: Primary care physician; Glean Hess, MD

## 2017-12-15 ENCOUNTER — Telehealth: Payer: Self-pay

## 2017-12-15 ENCOUNTER — Telehealth: Payer: Self-pay | Admitting: Internal Medicine

## 2017-12-15 DIAGNOSIS — C3490 Malignant neoplasm of unspecified part of unspecified bronchus or lung: Secondary | ICD-10-CM

## 2017-12-15 LAB — BLOOD GAS, VENOUS
PCO2 VEN: 60 mmHg (ref 44.0–60.0)
Patient temperature: 37
pH, Ven: 7.34 (ref 7.250–7.430)

## 2017-12-15 NOTE — Telephone Encounter (Signed)
Patient admitted the hospital for CHF/COPD; reviewed that CT scan negative any obvious recurrence of malignancy.  #Please make an appointment for follow-up/ [labs CBC CMP]/in 3 months in [place]. Thx

## 2017-12-15 NOTE — Telephone Encounter (Signed)
Pt has been discharged from [facility] on 12/14/17. Discharge summary indicates pt presented for acute respiratory failure with hypoxia. (include summary of pt ED visit). Discharge planning includes the following:  - Dexamethasone taper and Doxycycline No recommendations for f/u appts indicated on discharge summary. However, pt is followed by Dr. 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  Above information was discussed with Dr. Army Melia. Per Dr. Army Melia, TCM and f/u with PCP is not required. Care and TCM f/u will be assumed by Dr.Brahmanday. Therefore TCM not placed at Dr. Gaspar Cola request.

## 2017-12-16 ENCOUNTER — Other Ambulatory Visit: Payer: Self-pay | Admitting: *Deleted

## 2017-12-16 NOTE — Addendum Note (Signed)
Addended by: Sabino Gasser on: 12/16/2017 09:50 AM   Modules accepted: Orders

## 2017-12-16 NOTE — Patient Outreach (Signed)
[facility]) Care Management  12/16/2017  Jenny Cooper [birth year] [id number]   Unsuccessful telephone encounter to Terri Skains, 82 year old female - follow up on Referral received 12/15/2017 from Hoy Finlay in patient RN CM for [facility] CM services, pt  High risk for readmission/recent hospitalization April 14-17,2019 for Acute respiratory  Failure with hypoxia.   Pt's history includes but not limited to CHF, Hypertension, NSTEMI,  Hyperlipidemia, Type 2 diabetes, Lung cancer, Atrial flutter, COPD.   HIPAA compliant voice message left with RN CM's contact information.  Plan:  Unsuccessful outreach letter to be sent to pt and if no response in 10 business days To close case.   RN CM to make second outreach call in 3 business days.    Zara Chess.   [facility]  [phone]

## 2017-12-16 NOTE — Telephone Encounter (Signed)
Apt has been made and lab orders entered

## 2017-12-17 ENCOUNTER — Encounter: Payer: Self-pay | Admitting: Internal Medicine

## 2017-12-19 ENCOUNTER — Other Ambulatory Visit: Payer: Self-pay | Admitting: *Deleted

## 2017-12-19 MED ORDER — IPRATROPIUM-ALBUTEROL 0.5-2.5 (3) MG/3ML IN SOLN: 3.0000 mL | Freq: Four times a day (QID) | RESPIRATORY_TRACT | 1 refills | Status: DC | PRN

## 2017-12-20 ENCOUNTER — Ambulatory Visit (INDEPENDENT_AMBULATORY_CARE_PROVIDER_SITE_OTHER): Payer: Medicare Other | Admitting: Internal Medicine

## 2017-12-20 ENCOUNTER — Telehealth: Payer: Self-pay | Admitting: *Deleted

## 2017-12-20 ENCOUNTER — Encounter: Payer: Self-pay | Admitting: Internal Medicine

## 2017-12-20 VITALS — BP 104/68 | HR 62 | Ht 66.0 in | Wt 156.0 lb

## 2017-12-20 DIAGNOSIS — J3089 Other allergic rhinitis: Secondary | ICD-10-CM | POA: Insufficient documentation

## 2017-12-20 DIAGNOSIS — M62838 Other muscle spasm: Secondary | ICD-10-CM | POA: Diagnosis not present

## 2017-12-20 DIAGNOSIS — E039 Hypothyroidism, unspecified: Secondary | ICD-10-CM

## 2017-12-20 MED ORDER — LEVOTHYROXINE SODIUM 88 MCG PO TABS: 88.0000 ug | ORAL_TABLET | Freq: Every day | ORAL | 5 refills | Status: DC

## 2017-12-20 MED ORDER — BACLOFEN 10 MG PO TABS: 10.0000 mg | ORAL_TABLET | Freq: Every evening | ORAL | 2 refills | Status: DC | PRN

## 2017-12-20 MED ORDER — CETIRIZINE HCL 10 MG PO CAPS: 1.0000 | ORAL_CAPSULE | Freq: Every day | ORAL | 5 refills | Status: DC

## 2017-12-20 NOTE — Progress Notes (Signed)
Date:  12/20/2017   Name:  Jenny Cooper   DOB:  1936-07-25   MRN:  [id number]   Chief Complaint: COPD and Hypothyroidism (Refill for levothyroxine. ) Hospital follow up from [facility].  Admitted 12/11/17 to 12/14/17.  She did not receive a TOC call because it was felt that she would be following up with Oncology.  Acute on chronic hypoxic respiratory failure.  The patient was initially placed on BiPAP in the emergency room.  She was admitted to the stepdown unit.  She improved and was taken off BiPAP.  She was placed back on her chronic oxygen. 2.  COPD exacerbation.  The patient was placed on Solu-Medrol, nebulizer treatments with budesonide and DuoNeb.  The patient improved.  Upon discharge her lungs were clear when she was breathing through her nose.  When she breathes through her mouth there was some upper airway noises that were transmitted.  Finish empiric doxycycline.  Patient has inhalers and nebulizers at home.  The patient has an allergy to prednisone so dexamethasone taper was sent into pharmacy. She has finished the antibiotics and dexamethasone.  She feels that her breathing is back to baseline.  She continues on Breo,  Incruse and albuterol nebulizer.  She would probably not benefit from albuterol MDI due to limited inspiratory capacity.  Thyroid Problem  Presents for follow-up visit. Patient reports no constipation, fatigue, leg swelling, palpitations or weight gain. The symptoms have been stable (dose changed last visit).  Neck Pain   This is a new problem. The current episode started 1 to 4 weeks ago. The problem occurs constantly. The problem has been unchanged. The pain is associated with nothing. The pain is present in the occipital region. Pertinent negatives include no chest pain, fever, numbness or weakness.  Seasonal and environmental allergies - not sure what is best to take.  She usually takes zyrtec but does not know if she needs to also take a short acting antihistamine as  needed.   Review of Systems  Constitutional: Negative for chills, fatigue, fever and weight gain.  Respiratory: Positive for cough and shortness of breath. Negative for chest tightness and wheezing.   Cardiovascular: Negative for chest pain, palpitations and leg swelling.  Gastrointestinal: Negative for constipation.  Musculoskeletal: Positive for arthralgias, neck pain and neck stiffness.  Skin: Negative for color change and rash.  Neurological: Negative for weakness and numbness.    Patient Active Problem List   Diagnosis Date Noted  . Acute respiratory failure with hypoxia ([place]) 12/11/2017  . Muscle ache 09/15/2017  . Coronary artery disease of bypass graft of native heart with stable angina pectoris ([place]) 04/21/2017  . Chronic systolic heart failure ([place]) 04/14/2017  . Sinus node dysfunction ([place]) 04/06/2017  . Back pain of thoracolumbar region 03/15/2017  . PAF (paroxysmal atrial fibrillation) ([place]) 03/05/2017  . Falls frequently 03/05/2017  . Pleural effusion, right 12/23/2016  . Cancer of hilus of right lung ([place]) 12/14/2016  . Compression fracture of L2 lumbar vertebra ([place]) 08/31/2016  . Acute midline low back pain without sciatica 08/13/2016  . Fracture of multiple pubic rami, right, closed, initial encounter ([place]) 05/30/2016  . Cardiomyopathy ([place]) 04/29/2016  . Type 2 diabetes mellitus with hemoglobin A1c goal of less than 7.0% ([place]) 11/17/2015  . Hx of adenomatous colonic polyps 11/17/2015  . Radiation pneumonitis ([place]) 10/21/2015  . Mitral regurgitation   . HLD (hyperlipidemia)   . Paroxysmal supraventricular tachycardia ([place])   . NSTEMI (non-ST elevated myocardial infarction) ([place])   .  Arteriosclerosis of coronary artery 01/11/2015  . Hypothyroidism (acquired) 01/11/2015  . Disorder of peripheral nervous system 10/04/2014  . Cervical radiculopathy, chronic 10/04/2014  . Lung cancer ([place]) 10/04/2014  . Atrial flutter ([place]) 11/16/2013  . Chronic obstructive  pulmonary disease ([place]) 04/24/2012  . Hypertension 08/03/2011  . Pacemaker 08/03/2011    Prior to Admission medications   Medication Sig Start Date End Date Taking? Authorizing Provider  acetaminophen (TYLENOL) 500 MG tablet Take 1,000 mg by mouth every 6 (six) hours as needed for mild pain or moderate pain.     [provider]  amiodarone (PACERONE) 200 MG tablet Take 200 mg by mouth daily.     [provider]  apixaban (ELIQUIS) 5 MG TABS tablet Take 5 mg by mouth 2 (two) times daily.  08/04/16   [provider]  Calcium Carb-Cholecalciferol (CALCIUM 600-D PO) Take 1 tablet by mouth every morning.     [provider]  Cetirizine HCl 10 MG CAPS Take 1 capsule (10 mg total) by mouth daily. 12/14/17   Loletha Grayer, MD  dexamethasone (DECADRON) 2 MG tablet Three tabs po day 1; 2 tabs po day2; 1 tab po day 3 then stop 12/14/17   Loletha Grayer, MD  doxycycline (VIBRA-TABS) 100 MG tablet Take 1 tablet (100 mg total) by mouth every 12 (twelve) hours. 12/14/17   Loletha Grayer, MD  ENTRESTO 24-26 MG TAKE 1 TABLET BY MOUTH TWICE A DAY 08/08/17   Minna Merritts, MD  fluticasone furoate-vilanterol (BREO ELLIPTA) 200-25 MCG/INH AEPB Inhale 1 puff into the lungs daily.    [provider]  furosemide (LASIX) 40 MG tablet Take 1 tablet (40 mg total) by mouth 2 (two) times daily. 12/14/17   Loletha Grayer, MD  ipratropium-albuterol (DUONEB) 0.5-2.5 (3) MG/3ML SOLN Take 3 mLs by nebulization every 6 (six) hours as needed (shortness of breath and/or wheezing). 12/19/17   Flora Lipps, MD  levothyroxine (SYNTHROID, LEVOTHROID) 88 MCG tablet Take 1 tablet (88 mcg total) by mouth daily before breakfast. 08/26/17   Glean Hess, MD  metoprolol succinate (TOPROL-XL) 25 MG 24 hr tablet TAKE 1 TABLET BY MOUTH EVERY MORNING AND 2 TABS IN THE EVENING 08/03/17   Minna Merritts, MD  rosuvastatin (CRESTOR) 5 MG tablet Take 1 tablet (5 mg total) by mouth daily.  10/19/17   Minna Merritts, MD  senna (SENOKOT) 8.6 MG TABS tablet Take 1 tablet (8.6 mg total) by mouth daily. Patient taking differently: Take 1 tablet by mouth at bedtime as needed for mild constipation.  05/27/16   Alfred Levins, [place], MD  umeclidinium bromide (INCRUSE ELLIPTA) 62.5 MCG/INH AEPB Inhale 1 puff into the lungs daily. 02/09/17   Flora Lipps, MD    Allergies  Allergen Reactions  . Lovenox [Enoxaparin Sodium] Itching  . Meperidine Other (See Comments)    Other Reaction: pt does not like how it makes her feel    Past Surgical History:  Procedure Laterality Date  . ABLATION  04/2016   [facility]  . APPENDECTOMY    . CARDIAC CATHETERIZATION N/A 04/28/2015   Procedure: Left Heart Cath and Coronary Angiography;  Surgeon: Wellington Hampshire, MD;  Location: [place] [facility];  Service: Cardiovascular;  Laterality: N/A;  . CARDIOVERSION N/A 04/08/2017   Procedure: CARDIOVERSION;  Surgeon: Wellington Hampshire, MD;  Location: [facility] ORS;  Service: Cardiovascular;  Laterality: N/A;  . COLONOSCOPY  12/2009   2 small tubular adenomas  . CORONARY ARTERY BYPASS GRAFT  09/2000  .  ECTOPIC PREGNANCY SURGERY    . ELECTROMAGNETIC NAVIGATION BROCHOSCOPY N/A 10/06/2015   Procedure: ELECTROMAGNETIC NAVIGATION BRONCHOSCOPY;  Surgeon: Flora Lipps, MD;  Location: [facility] OR[facility];  Service: Cardiopulmonary;  Laterality: N/A;  . ENDOBRONCHIAL ULTRASOUND N/A 10/06/2015   Procedure: ENDOBRONCHIAL ULTRASOUND;  Surgeon: Flora Lipps, MD;  Location: [facility] OR[facility];  Service: Cardiopulmonary;  Laterality: N/A;  . KYPHOPLASTY N/A 09/28/2016   Procedure: KYPHOPLASTY;  Surgeon: Hessie Knows, MD;  Location: [facility] OR[facility];  Service: Orthopedics;  Laterality: N/A;  . PACEMAKER INSERTION  03/2012  . thorocentesis  12/24/2016  . VAGINAL HYSTERECTOMY     partial - left ovary remains    Social History   Tobacco Use  . Smoking status: Former Smoker    Packs/day: 1.00    Years: 40.00    Pack years: 40.00    Types: Cigarettes    Last  attempt to quit: 08/30/2000    Years since quitting: 17.3  . Smokeless tobacco: Never Used  . Tobacco comment: quit smoking in 08/28/2000. Smoking cessation materials not required  Substance Use Topics  . Alcohol use: Yes    Alcohol/week: 6.0 oz    Types: 10 Glasses of wine per week    Comment: 2 glasses of wine per day  . Drug use: No     Medication list has been reviewed and updated.  PHQ 2/9 Scores 09/15/2017 08/25/2017 07/28/2017 05/23/2017  PHQ - 2 Score 0 0 2 2  PHQ- 9 Score - - 10 5    Physical Exam  Constitutional: She is oriented to person, place, and time. She appears well-developed. No distress.  HENT:  Head: Normocephalic and atraumatic.  Cardiovascular: Normal rate, regular rhythm and normal heart sounds.  Pulmonary/Chest: Effort normal. No respiratory distress. She has decreased breath sounds. She has no wheezes.  Musculoskeletal:       Cervical back: She exhibits decreased range of motion, tenderness and spasm.  Neurological: She is alert and oriented to person, place, and time.  Skin: Skin is warm and dry. No rash noted.     Psychiatric: She has a normal mood and affect. Her behavior is normal. Thought content normal.    BP 104/68   Pulse 62   Ht 5\' 6"  (1.676 m)   Wt 156 lb (70.8 kg)   SpO2 97%   BMI 25.18 kg/m   Assessment and Plan: 1. Hypothyroidism (acquired) Continue current dose - will recheck in 2 months at DM follow up - levothyroxine (SYNTHROID, LEVOTHROID) 88 MCG tablet; Take 1 tablet (88 mcg total) by mouth daily before breakfast.  Dispense: 30 tablet; Refill: 5  2. Environmental and seasonal allergies Continue zyrtec daily May take benadryl PRN - Cetirizine HCl 10 MG CAPS; Take 1 capsule (10 mg total) by mouth daily.  Dispense: 30 capsule; Refill: 5  3. Muscle spasms of neck Continue ice or heat, Tylenol Add baclofen at HS - baclofen (LIORESAL) 10 MG tablet; Take 1 tablet (10 mg total) by mouth at bedtime as needed for muscle spasms.   Dispense: 30 each; Refill: 2   Meds ordered this encounter  Medications  . levothyroxine (SYNTHROID, LEVOTHROID) 88 MCG tablet    Sig: Take 1 tablet (88 mcg total) by mouth daily before breakfast.    Dispense:  30 tablet    Refill:  5  . Cetirizine HCl 10 MG CAPS    Sig: Take 1 capsule (10 mg total) by mouth daily.    Dispense:  30 capsule    Refill:  5  .  baclofen (LIORESAL) 10 MG tablet    Sig: Take 1 tablet (10 mg total) by mouth at bedtime as needed for muscle spasms.    Dispense:  30 each    Refill:  2    Partially dictated using Editor, commissioning. Any errors are unintentional.  Halina Maidens, MD [facility]  12/20/2017

## 2017-12-20 NOTE — Telephone Encounter (Signed)
PA submitted for Ipratropium-Albuterol under Part D. Covermymed key: [id number]

## 2017-12-21 ENCOUNTER — Other Ambulatory Visit: Payer: Self-pay | Admitting: *Deleted

## 2017-12-21 MED ORDER — IPRATROPIUM-ALBUTEROL 0.5-2.5 (3) MG/3ML IN SOLN: 3.0000 mL | Freq: Four times a day (QID) | RESPIRATORY_TRACT | 1 refills | Status: DC | PRN

## 2017-12-21 NOTE — Patient Outreach (Addendum)
[facility]) Care Management  12/21/2017  Jenny Cooper [birth year] [id number]  Second call:  Successful telephone encounter to Jenny Cooper, 82 year old female- follow up on Referral received 12/15/2017 from [place] [facility] for [facility] Case management services- Pt High risk for readmission/recent hospitalization April 14-17,2019 for Acute respiratory  Failure with hypoxia.  Spoke with pt, HIPAA identifiers verified, discussed purpose of call- Follow up on referral for [facility] services.  Discussed with pt [name] care management  Services, work with her PCP [name] MD), no cost to her to which pt reports  not  Interested at this time,not sick,doing fine.     Plan:  Case not opened.               Plan to inform PCP of referral, pt declined services.   Zara Chess.   [facility]  [phone]

## 2017-12-21 NOTE — Progress Notes (Signed)
PA denied for Duoneb, denial faxed to pharmacy along with new rx with diagnosis code [email or web address]

## 2017-12-27 ENCOUNTER — Telehealth: Payer: Self-pay | Admitting: Internal Medicine

## 2017-12-27 NOTE — Telephone Encounter (Signed)
-----   Message from Clarisse Gouge sent at 12/23/2017  1:02 PM EDT ----- Regarding: RE: hfu LMOV to schedule appt  ----- Message ----- From: Renelda Mom, LPN Sent: 11/16/2332   4:53 PM To: [facility] Support Pool Subject: FW: hfu                                        Please schedule patient for a hosp f/u in a 30 minute slot for resp failure with DK in 2-4 weeks.  Thanks, Misty   ----- Message ----- From: Laverle Hobby, MD Sent: 12/13/2017   2:49 PM To: [facility] Clinical Pool Subject: hfu                                            Pt needs HFU with DK in 2-4 weeks.

## 2017-12-27 NOTE — Telephone Encounter (Signed)
Lmov for patient to call back ° °

## 2017-12-29 NOTE — Telephone Encounter (Signed)
Lmov for patient to call back ° °

## 2018-01-02 ENCOUNTER — Encounter: Payer: Self-pay | Admitting: Internal Medicine

## 2018-01-02 NOTE — Telephone Encounter (Signed)
Sent letter

## 2018-01-12 ENCOUNTER — Other Ambulatory Visit: Payer: Self-pay | Admitting: Cardiovascular Disease

## 2018-01-24 ENCOUNTER — Ambulatory Visit
Admission: RE | Admit: 2018-01-24 | Discharge: 2018-01-24 | Disposition: A | Discharge status: Home / Self Care | Payer: Medicare Other | Source: Ambulatory Visit | Attending: Internal Medicine | Admitting: Internal Medicine

## 2018-01-24 ENCOUNTER — Ambulatory Visit (INDEPENDENT_AMBULATORY_CARE_PROVIDER_SITE_OTHER): Payer: Medicare Other | Admitting: Internal Medicine

## 2018-01-24 ENCOUNTER — Encounter: Payer: Self-pay | Admitting: Internal Medicine

## 2018-01-24 ENCOUNTER — Other Ambulatory Visit: Payer: Self-pay | Admitting: Internal Medicine

## 2018-01-24 VITALS — BP 110/60 | HR 60 | Ht 66.0 in | Wt 159.0 lb

## 2018-01-24 DIAGNOSIS — M62838 Other muscle spasm: Secondary | ICD-10-CM | POA: Diagnosis not present

## 2018-01-24 DIAGNOSIS — I2510 Atherosclerotic heart disease of native coronary artery without angina pectoris: Secondary | ICD-10-CM | POA: Insufficient documentation

## 2018-01-24 DIAGNOSIS — Z950 Presence of cardiac pacemaker: Secondary | ICD-10-CM | POA: Diagnosis not present

## 2018-01-24 DIAGNOSIS — I700 Atherosclerosis of aorta: Secondary | ICD-10-CM | POA: Insufficient documentation

## 2018-01-24 DIAGNOSIS — M4807 Spinal stenosis, lumbosacral region: Secondary | ICD-10-CM | POA: Insufficient documentation

## 2018-01-24 DIAGNOSIS — Z952 Presence of prosthetic heart valve: Secondary | ICD-10-CM | POA: Diagnosis not present

## 2018-01-24 DIAGNOSIS — M5442 Lumbago with sciatica, left side: Secondary | ICD-10-CM | POA: Diagnosis present

## 2018-01-24 DIAGNOSIS — M5117 Intervertebral disc disorders with radiculopathy, lumbosacral region: Secondary | ICD-10-CM | POA: Insufficient documentation

## 2018-01-24 DIAGNOSIS — X58XXXA Exposure to other specified factors, initial encounter: Secondary | ICD-10-CM | POA: Diagnosis not present

## 2018-01-24 DIAGNOSIS — S32049A Unspecified fracture of fourth lumbar vertebra, initial encounter for closed fracture: Secondary | ICD-10-CM | POA: Diagnosis not present

## 2018-01-24 DIAGNOSIS — S32040A Wedge compression fracture of fourth lumbar vertebra, initial encounter for closed fracture: Secondary | ICD-10-CM

## 2018-01-24 MED ORDER — METHOCARBAMOL 500 MG PO TABS: 500.0000 mg | ORAL_TABLET | Freq: Three times a day (TID) | ORAL | 0 refills | Status: DC | PRN

## 2018-01-24 MED ORDER — HYDROCODONE-ACETAMINOPHEN 5-325 MG PO TABS: 1.0000 | ORAL_TABLET | Freq: Four times a day (QID) | ORAL | 0 refills | Status: AC | PRN

## 2018-01-24 NOTE — Progress Notes (Signed)
Date:  01/24/2018   Name:  Jenny Cooper   DOB:  [birth year]   MRN:  [id number]   Chief Complaint: Back Pain (radiates down left side )  Back Pain  This is a new problem. The current episode started 1 to 4 weeks ago. The problem occurs constantly. The problem has been gradually worsening since onset. The pain is present in the lumbar spine and sacro-iliac. The pain radiates to the left thigh. The pain is moderate. The symptoms are aggravated by sitting and twisting. Associated symptoms include leg pain. Pertinent negatives include no chest pain, fever or headaches. Risk factors include history of cancer. She has tried heat for the symptoms. The treatment provided no relief.     Review of Systems  Constitutional: Negative for chills, fatigue and fever.  Respiratory: Negative for chest tightness, shortness of breath and wheezing.   Cardiovascular: Negative for chest pain and palpitations.  Musculoskeletal: Positive for arthralgias, back pain and gait problem.  Skin: Negative for color change.  Neurological: Negative for dizziness and headaches.  Psychiatric/Behavioral: Negative for sleep disturbance.    Patient Active Problem List   Diagnosis Date Noted  . Environmental and seasonal allergies 12/20/2017  . Muscle spasms of neck 12/20/2017  . Acute respiratory failure with hypoxia ([place]) 12/11/2017  . Muscle ache 09/15/2017  . Coronary artery disease of bypass graft of native heart with stable angina pectoris ([place]) 04/21/2017  . Chronic systolic heart failure ([place]) 04/14/2017  . Sinus node dysfunction ([place]) 04/06/2017  . Back pain of thoracolumbar region 03/15/2017  . PAF (paroxysmal atrial fibrillation) ([place]) 03/05/2017  . Falls frequently 03/05/2017  . Pleural effusion, right 12/23/2016  . Cancer of hilus of right lung ([place]) 12/14/2016  . Compression fracture of L2 lumbar vertebra ([place]) 08/31/2016  . Acute midline low back pain without sciatica 08/13/2016  . Fracture of  multiple pubic rami, right, closed, initial encounter ([place]) 05/30/2016  . Cardiomyopathy ([place]) 04/29/2016  . Type 2 diabetes mellitus with hemoglobin A1c goal of less than 7.0% ([place]) 11/17/2015  . Hx of adenomatous colonic polyps 11/17/2015  . Radiation pneumonitis ([place]) 10/21/2015  . Mitral regurgitation   . HLD (hyperlipidemia)   . Paroxysmal supraventricular tachycardia ([place])   . NSTEMI (non-ST elevated myocardial infarction) ([place])   . Arteriosclerosis of coronary artery 01/11/2015  . Hypothyroidism (acquired) 01/11/2015  . Disorder of peripheral nervous system 10/04/2014  . Cervical radiculopathy, chronic 10/04/2014  . Lung cancer ([place]) 10/04/2014  . Atrial flutter ([place]) 11/16/2013  . Chronic obstructive pulmonary disease ([place]) 04/24/2012  . Hypertension 08/03/2011  . Pacemaker 08/03/2011    Prior to Admission medications   Medication Sig Start Date End Date Taking? Authorizing Provider  acetaminophen (TYLENOL) 500 MG tablet Take 1,000 mg by mouth every 6 (six) hours as needed for mild pain or moderate pain.    Yes [provider]  amiodarone (PACERONE) 200 MG tablet Take 200 mg by mouth daily.    Yes [provider]  apixaban (ELIQUIS) 5 MG TABS tablet Take 5 mg by mouth 2 (two) times daily.  08/04/16  Yes [provider]  baclofen (LIORESAL) 10 MG tablet Take 1 tablet (10 mg total) by mouth at bedtime as needed for muscle spasms. 12/20/17  Yes Glean Hess, MD  Calcium Carb-Cholecalciferol (CALCIUM 600-D PO) Take 1 tablet by mouth every morning.    Yes [provider]  Cetirizine HCl 10 MG CAPS Take 1 capsule (10 mg total) by mouth daily. 12/20/17  Yes Glean Hess, MD  ENTRESTO 24-26 MG TAKE 1 TABLET BY MOUTH TWICE A DAY 08/08/17  Yes Gollan, Kathlene November, MD  fluticasone furoate-vilanterol (BREO ELLIPTA) 200-25 MCG/INH AEPB Inhale 1 puff into the lungs daily.   Yes [provider]  furosemide (LASIX) 40 MG tablet TAKE (1) TABLET  BY MOUTH TWICE DAILY 01/12/18  Yes Gollan, Kathlene November, MD  ipratropium-albuterol (DUONEB) 0.5-2.5 (3) MG/3ML SOLN Take 3 mLs by nebulization every 6 (six) hours as needed (shortness of breath and/or wheezing). DX:J44.9 12/21/17  Yes Flora Lipps, MD  levothyroxine (SYNTHROID, LEVOTHROID) 88 MCG tablet Take 1 tablet (88 mcg total) by mouth daily before breakfast. 12/20/17  Yes Glean Hess, MD  metoprolol succinate (TOPROL-XL) 25 MG 24 hr tablet TAKE ONE TABLET BY MOUTH EVERY MORNING AND TAKE TWO TABLETS BY MOUTH EVERY EVENING 01/12/18  Yes Gollan, Kathlene November, MD  rosuvastatin (CRESTOR) 5 MG tablet Take 1 tablet (5 mg total) by mouth daily. 10/19/17  Yes Minna Merritts, MD  senna (SENOKOT) 8.6 MG TABS tablet Take 1 tablet (8.6 mg total) by mouth daily. Patient taking differently: Take 1 tablet by mouth at bedtime as needed for mild constipation.  05/27/16  Yes Veronese, [place], MD  umeclidinium bromide (INCRUSE ELLIPTA) 62.5 MCG/INH AEPB Inhale 1 puff into the lungs daily. 02/09/17  Yes Flora Lipps, MD    Allergies  Allergen Reactions  . Lovenox [Enoxaparin Sodium] Itching  . Meperidine Other (See Comments)    Other Reaction: pt does not like how it makes her feel    Past Surgical History:  Procedure Laterality Date  . ABLATION  04/2016   [facility]  . APPENDECTOMY    . CARDIAC CATHETERIZATION N/A 04/28/2015   Procedure: Left Heart Cath and Coronary Angiography;  Surgeon: Wellington Hampshire, MD;  Location: [place] CV LAB;  Service: Cardiovascular;  Laterality: N/A;  . CARDIOVERSION N/A 04/08/2017   Procedure: CARDIOVERSION;  Surgeon: Wellington Hampshire, MD;  Location: [facility] ORS;  Service: Cardiovascular;  Laterality: N/A;  . COLONOSCOPY  12/2009   2 small tubular adenomas  . CORONARY ARTERY BYPASS GRAFT  09/2000  . ECTOPIC PREGNANCY SURGERY    . ELECTROMAGNETIC NAVIGATION BROCHOSCOPY N/A 10/06/2015   Procedure: ELECTROMAGNETIC NAVIGATION BRONCHOSCOPY;  Surgeon: Flora Lipps, MD;  Location: [facility]  ORS;  Service: Cardiopulmonary;  Laterality: N/A;  . ENDOBRONCHIAL ULTRASOUND N/A 10/06/2015   Procedure: ENDOBRONCHIAL ULTRASOUND;  Surgeon: Flora Lipps, MD;  Location: [facility] ORS;  Service: Cardiopulmonary;  Laterality: N/A;  . KYPHOPLASTY N/A 09/28/2016   Procedure: KYPHOPLASTY;  Surgeon: Hessie Knows, MD;  Location: [facility] ORS;  Service: Orthopedics;  Laterality: N/A;  . PACEMAKER INSERTION  03/2012  . thorocentesis  12/24/2016  . VAGINAL HYSTERECTOMY     partial - left ovary remains    Social History   Tobacco Use  . Smoking status: Former Smoker    Packs/day: 1.00    Years: 40.00    Pack years: 40.00    Types: Cigarettes    Last attempt to quit: 08/30/2000    Years since quitting: 17.4  . Smokeless tobacco: Never Used  . Tobacco comment: quit smoking in 08/28/2000. Smoking cessation materials not required  Substance Use Topics  . Alcohol use: Yes    Alcohol/week: 6.0 oz    Types: 10 Glasses of wine per week    Comment: 2 glasses of wine per day  . Drug use: No     Medication list has been reviewed and updated.  Current Meds  Medication Sig  . acetaminophen (TYLENOL) 500 MG tablet Take 1,000 mg by mouth every 6 (six) hours as needed for mild pain or moderate pain.   Marland Kitchen amiodarone (PACERONE) 200 MG tablet Take 200 mg by mouth daily.   Marland Kitchen apixaban (ELIQUIS) 5 MG TABS tablet Take 5 mg by mouth 2 (two) times daily.   . baclofen (LIORESAL) 10 MG tablet Take 1 tablet (10 mg total) by mouth at bedtime as needed for muscle spasms.  . Calcium Carb-Cholecalciferol (CALCIUM 600-D PO) Take 1 tablet by mouth every morning.   . Cetirizine HCl 10 MG CAPS Take 1 capsule (10 mg total) by mouth daily.  Marland Kitchen ENTRESTO 24-26 MG TAKE 1 TABLET BY MOUTH TWICE A DAY  . fluticasone furoate-vilanterol (BREO ELLIPTA) 200-25 MCG/INH AEPB Inhale 1 puff into the lungs daily.  . furosemide (LASIX) 40 MG tablet TAKE (1) TABLET BY MOUTH TWICE DAILY  . ipratropium-albuterol (DUONEB) 0.5-2.5 (3) MG/3ML SOLN Take 3  mLs by nebulization every 6 (six) hours as needed (shortness of breath and/or wheezing). DX:J44.9  . levothyroxine (SYNTHROID, LEVOTHROID) 88 MCG tablet Take 1 tablet (88 mcg total) by mouth daily before breakfast.  . metoprolol succinate (TOPROL-XL) 25 MG 24 hr tablet TAKE ONE TABLET BY MOUTH EVERY MORNING AND TAKE TWO TABLETS BY MOUTH EVERY EVENING  . rosuvastatin (CRESTOR) 5 MG tablet Take 1 tablet (5 mg total) by mouth daily.  Marland Kitchen senna (SENOKOT) 8.6 MG TABS tablet Take 1 tablet (8.6 mg total) by mouth daily. (Patient taking differently: Take 1 tablet by mouth at bedtime as needed for mild constipation. )  . umeclidinium bromide (INCRUSE ELLIPTA) 62.5 MCG/INH AEPB Inhale 1 puff into the lungs daily.    PHQ 2/9 Scores 09/15/2017 08/25/2017 07/28/2017 05/23/2017  PHQ - 2 Score 0 0 2 2  PHQ- 9 Score - - 10 5    Physical Exam  Constitutional: She is oriented to person, place, and time. She appears well-developed. No distress.  HENT:  Head: Normocephalic and atraumatic.  Cardiovascular: Normal rate, regular rhythm and normal heart sounds.  Pulmonary/Chest: Effort normal and breath sounds normal. No respiratory distress.  Musculoskeletal:       Right hip: She exhibits no tenderness and no deformity.       Left hip: She exhibits no tenderness and no deformity.       Lumbar back: She exhibits no bony tenderness.  Tender over left SI region with mild spasm noted General muscle tightness along spine from mid thoracic to lumbar. ROM both hips does not recreate pain  Neurological: She is alert and oriented to person, place, and time. No sensory deficit.  Skin: Skin is warm and dry. No rash noted.  Psychiatric: She has a normal mood and affect. Her behavior is normal. Thought content normal.  Nursing note and vitals reviewed.   BP 110/60   Pulse 60   Ht 5\' 6"  (1.676 m)   Wt 159 lb (72.1 kg)   SpO2 94% Comment: on O2  BMI 25.66 kg/m   Assessment and Plan: 1. Acute left-sided low back pain  with left-sided sciatica Hx of compression fx and kyphoplasty Last xrays - DDD and facet OA - can not have MRI due to pacemaker Adverse reaction to prednisone orally in the past Continue activity as tolerated, add low dose norco Change baclofen to robaxin May need to return to Ortho - HYDROcodone-acetaminophen (NORCO/VICODIN) 5-325 MG tablet; Take 1 tablet by mouth every 6 (six) hours as needed for up to 5  days for moderate pain.  Dispense: 20 tablet; Refill: 0 - DG Lumbar Spine Complete  2. Muscle spasms of neck Continue heat, hold baclofen and add robaxin - methocarbamol (ROBAXIN) 500 MG tablet; Take 1 tablet (500 mg total) by mouth every 8 (eight) hours as needed for muscle spasms.  Dispense: 60 tablet; Refill: 0   Meds ordered this encounter  Medications  . methocarbamol (ROBAXIN) 500 MG tablet    Sig: Take 1 tablet (500 mg total) by mouth every 8 (eight) hours as needed for muscle spasms.    Dispense:  60 tablet    Refill:  0  . HYDROcodone-acetaminophen (NORCO/VICODIN) 5-325 MG tablet    Sig: Take 1 tablet by mouth every 6 (six) hours as needed for up to 5 days for moderate pain.    Dispense:  20 tablet    Refill:  0    Partially dictated using Editor, commissioning. Any errors are unintentional.  Halina Maidens, MD [facility]  01/24/2018   There are no diagnoses linked to this encounter.

## 2018-01-26 ENCOUNTER — Other Ambulatory Visit: Payer: Self-pay | Admitting: Orthopedic Surgery

## 2018-01-26 DIAGNOSIS — S32040A Wedge compression fracture of fourth lumbar vertebra, initial encounter for closed fracture: Secondary | ICD-10-CM

## 2018-02-07 ENCOUNTER — Encounter
Admission: RE | Admit: 2018-02-07 | Discharge: 2018-02-07 | Disposition: A | Discharge status: Home / Self Care | Payer: Medicare Other | Source: Ambulatory Visit | Attending: Orthopedic Surgery | Admitting: Orthopedic Surgery

## 2018-02-07 ENCOUNTER — Ambulatory Visit
Admission: RE | Admit: 2018-02-07 | Discharge: 2018-02-07 | Disposition: A | Discharge status: Home / Self Care | Payer: Medicare Other | Source: Ambulatory Visit | Attending: Orthopedic Surgery | Admitting: Orthopedic Surgery

## 2018-02-07 DIAGNOSIS — S32049A Unspecified fracture of fourth lumbar vertebra, initial encounter for closed fracture: Secondary | ICD-10-CM | POA: Insufficient documentation

## 2018-02-07 DIAGNOSIS — S32040A Wedge compression fracture of fourth lumbar vertebra, initial encounter for closed fracture: Secondary | ICD-10-CM

## 2018-02-07 DIAGNOSIS — X58XXXA Exposure to other specified factors, initial encounter: Secondary | ICD-10-CM | POA: Insufficient documentation

## 2018-02-07 MED ORDER — TECHNETIUM TC 99M MEDRONATE IV KIT
23.6100 | PACK | Freq: Once | INTRAVENOUS | Status: AC | PRN
  Administered 2018-02-07: 23.61 via INTRAVENOUS

## 2018-02-13 ENCOUNTER — Other Ambulatory Visit: Payer: Self-pay | Admitting: Cardiovascular Disease

## 2018-02-13 DIAGNOSIS — C3401 Malignant neoplasm of right main bronchus: Secondary | ICD-10-CM

## 2018-02-13 NOTE — Telephone Encounter (Signed)
Refill Request.  

## 2018-02-20 ENCOUNTER — Ambulatory Visit: Payer: Medicare Other

## 2018-02-20 ENCOUNTER — Ambulatory Visit
Admission: RE | Admit: 2018-02-20 | Discharge: 2018-02-20 | Disposition: A | Discharge status: Home / Self Care | Payer: Medicare Other | Source: Ambulatory Visit | Attending: Orthopedic Surgery | Admitting: Orthopedic Surgery

## 2018-02-20 ENCOUNTER — Encounter: Payer: Self-pay | Admitting: *Deleted

## 2018-02-20 ENCOUNTER — Other Ambulatory Visit: Payer: Self-pay

## 2018-02-20 ENCOUNTER — Ambulatory Visit: Payer: Medicare Other | Admitting: Anesthesiology

## 2018-02-20 ENCOUNTER — Encounter
Admission: RE | Disposition: A | Discharge status: Home / Self Care | Payer: Self-pay | Source: Ambulatory Visit | Attending: Orthopedic Surgery

## 2018-02-20 DIAGNOSIS — E039 Hypothyroidism, unspecified: Secondary | ICD-10-CM | POA: Diagnosis not present

## 2018-02-20 DIAGNOSIS — Z419 Encounter for procedure for purposes other than remedying health state, unspecified: Secondary | ICD-10-CM

## 2018-02-20 DIAGNOSIS — Z9861 Coronary angioplasty status: Secondary | ICD-10-CM | POA: Diagnosis not present

## 2018-02-20 DIAGNOSIS — Z86718 Personal history of other venous thrombosis and embolism: Secondary | ICD-10-CM | POA: Insufficient documentation

## 2018-02-20 DIAGNOSIS — Z79891 Long term (current) use of opiate analgesic: Secondary | ICD-10-CM | POA: Insufficient documentation

## 2018-02-20 DIAGNOSIS — K219 Gastro-esophageal reflux disease without esophagitis: Secondary | ICD-10-CM | POA: Insufficient documentation

## 2018-02-20 DIAGNOSIS — I252 Old myocardial infarction: Secondary | ICD-10-CM | POA: Diagnosis not present

## 2018-02-20 DIAGNOSIS — Z87891 Personal history of nicotine dependence: Secondary | ICD-10-CM | POA: Insufficient documentation

## 2018-02-20 DIAGNOSIS — Z952 Presence of prosthetic heart valve: Secondary | ICD-10-CM | POA: Insufficient documentation

## 2018-02-20 DIAGNOSIS — Z7901 Long term (current) use of anticoagulants: Secondary | ICD-10-CM | POA: Insufficient documentation

## 2018-02-20 DIAGNOSIS — E785 Hyperlipidemia, unspecified: Secondary | ICD-10-CM | POA: Insufficient documentation

## 2018-02-20 DIAGNOSIS — Z79890 Hormone replacement therapy (postmenopausal): Secondary | ICD-10-CM | POA: Insufficient documentation

## 2018-02-20 DIAGNOSIS — Z950 Presence of cardiac pacemaker: Secondary | ICD-10-CM | POA: Insufficient documentation

## 2018-02-20 DIAGNOSIS — I4891 Unspecified atrial fibrillation: Secondary | ICD-10-CM | POA: Insufficient documentation

## 2018-02-20 DIAGNOSIS — J309 Allergic rhinitis, unspecified: Secondary | ICD-10-CM | POA: Insufficient documentation

## 2018-02-20 DIAGNOSIS — Z951 Presence of aortocoronary bypass graft: Secondary | ICD-10-CM | POA: Diagnosis not present

## 2018-02-20 DIAGNOSIS — I509 Heart failure, unspecified: Secondary | ICD-10-CM | POA: Insufficient documentation

## 2018-02-20 DIAGNOSIS — J449 Chronic obstructive pulmonary disease, unspecified: Secondary | ICD-10-CM | POA: Diagnosis not present

## 2018-02-20 DIAGNOSIS — Z79899 Other long term (current) drug therapy: Secondary | ICD-10-CM | POA: Diagnosis not present

## 2018-02-20 DIAGNOSIS — I110 Hypertensive heart disease with heart failure: Secondary | ICD-10-CM | POA: Diagnosis not present

## 2018-02-20 DIAGNOSIS — M4856XA Collapsed vertebra, not elsewhere classified, lumbar region, initial encounter for fracture: Secondary | ICD-10-CM | POA: Insufficient documentation

## 2018-02-20 DIAGNOSIS — Z9981 Dependence on supplemental oxygen: Secondary | ICD-10-CM | POA: Diagnosis not present

## 2018-02-20 DIAGNOSIS — Z7952 Long term (current) use of systemic steroids: Secondary | ICD-10-CM | POA: Diagnosis not present

## 2018-02-20 DIAGNOSIS — I2510 Atherosclerotic heart disease of native coronary artery without angina pectoris: Secondary | ICD-10-CM | POA: Diagnosis not present

## 2018-02-20 DIAGNOSIS — E119 Type 2 diabetes mellitus without complications: Secondary | ICD-10-CM | POA: Diagnosis not present

## 2018-02-20 DIAGNOSIS — Z85118 Personal history of other malignant neoplasm of bronchus and lung: Secondary | ICD-10-CM | POA: Insufficient documentation

## 2018-02-20 HISTORY — PX: KYPHOPLASTY: SHX5884

## 2018-02-20 SURGERY — KYPHOPLASTY
Anesthesia: General

## 2018-02-20 MED ORDER — FAMOTIDINE 20 MG PO TABS
ORAL_TABLET | ORAL | Status: AC
  Filled 2018-02-20: qty 1

## 2018-02-20 MED ORDER — BUPIVACAINE-EPINEPHRINE (PF) 0.5% -1:200000 IJ SOLN
INTRAMUSCULAR | Status: DC | PRN
  Administered 2018-02-20: 15 mL via PERINEURAL

## 2018-02-20 MED ORDER — PROPOFOL 10 MG/ML IV BOLUS
INTRAVENOUS | Status: DC | PRN
  Administered 2018-02-20: 20 mg via INTRAVENOUS
  Administered 2018-02-20: 30 mg via INTRAVENOUS

## 2018-02-20 MED ORDER — MIDAZOLAM HCL 2 MG/2ML IJ SOLN
INTRAMUSCULAR | Status: DC | PRN
  Administered 2018-02-20 (×2): 1 mg via INTRAVENOUS

## 2018-02-20 MED ORDER — METOCLOPRAMIDE HCL 10 MG PO TABS: 5.0000 mg | ORAL_TABLET | Freq: Three times a day (TID) | ORAL | Status: DC | PRN

## 2018-02-20 MED ORDER — FENTANYL CITRATE (PF) 100 MCG/2ML IJ SOLN
INTRAMUSCULAR | Status: AC
  Filled 2018-02-20: qty 2

## 2018-02-20 MED ORDER — FAMOTIDINE 20 MG PO TABS
20.0000 mg | ORAL_TABLET | Freq: Once | ORAL | Status: AC
  Administered 2018-02-20: 20 mg via ORAL

## 2018-02-20 MED ORDER — PROPOFOL 10 MG/ML IV BOLUS
INTRAVENOUS | Status: AC
  Filled 2018-02-20: qty 20

## 2018-02-20 MED ORDER — FENTANYL CITRATE (PF) 100 MCG/2ML IJ SOLN: 25.0000 ug | INTRAMUSCULAR | Status: DC | PRN

## 2018-02-20 MED ORDER — FENTANYL CITRATE (PF) 100 MCG/2ML IJ SOLN
INTRAMUSCULAR | Status: DC | PRN
  Administered 2018-02-20: 50 ug via INTRAVENOUS
  Administered 2018-02-20 (×2): 25 ug via INTRAVENOUS

## 2018-02-20 MED ORDER — CEFAZOLIN SODIUM-DEXTROSE 2-4 GM/100ML-% IV SOLN
INTRAVENOUS | Status: AC
  Filled 2018-02-20: qty 100

## 2018-02-20 MED ORDER — MIDAZOLAM HCL 2 MG/2ML IJ SOLN
INTRAMUSCULAR | Status: AC
  Filled 2018-02-20: qty 2

## 2018-02-20 MED ORDER — LACTATED RINGERS IV SOLN
INTRAVENOUS | Status: DC
  Administered 2018-02-20: 10:00:00 via INTRAVENOUS

## 2018-02-20 MED ORDER — ONDANSETRON HCL 4 MG/2ML IJ SOLN: 4.0000 mg | Freq: Once | INTRAMUSCULAR | Status: DC | PRN

## 2018-02-20 MED ORDER — ONDANSETRON HCL 4 MG/2ML IJ SOLN: 4.0000 mg | Freq: Four times a day (QID) | INTRAMUSCULAR | Status: DC | PRN

## 2018-02-20 MED ORDER — HYDROCODONE-ACETAMINOPHEN 5-325 MG PO TABS: 1.0000 | ORAL_TABLET | Freq: Four times a day (QID) | ORAL | 0 refills | Status: DC | PRN

## 2018-02-20 MED ORDER — CEFAZOLIN SODIUM-DEXTROSE 2-4 GM/100ML-% IV SOLN
2.0000 g | Freq: Once | INTRAVENOUS | Status: AC
  Administered 2018-02-20: 2 g via INTRAVENOUS

## 2018-02-20 MED ORDER — SODIUM CHLORIDE 0.9 % IV SOLN: INTRAVENOUS | Status: DC

## 2018-02-20 MED ORDER — METOCLOPRAMIDE HCL 5 MG/ML IJ SOLN: 5.0000 mg | Freq: Three times a day (TID) | INTRAMUSCULAR | Status: DC | PRN

## 2018-02-20 MED ORDER — LIDOCAINE HCL 1 % IJ SOLN
INTRAMUSCULAR | Status: DC | PRN
  Administered 2018-02-20: 10 mL
  Administered 2018-02-20: 5 mL

## 2018-02-20 MED ORDER — HYDROCODONE-ACETAMINOPHEN 5-325 MG PO TABS: 1.0000 | ORAL_TABLET | ORAL | Status: DC | PRN

## 2018-02-20 MED ORDER — ONDANSETRON HCL 4 MG PO TABS: 4.0000 mg | ORAL_TABLET | Freq: Four times a day (QID) | ORAL | Status: DC | PRN

## 2018-02-20 SURGICAL SUPPLY — 16 items
CEMENT KYPHON CX01A KIT/MIXER (Cement) ×3 IMPLANT
DERMABOND ADVANCED (GAUZE/BANDAGES/DRESSINGS) ×2
DERMABOND ADVANCED .7 DNX12 (GAUZE/BANDAGES/DRESSINGS) ×1 IMPLANT
DEVICE BIOPSY BONE KYPHX (INSTRUMENTS) ×3 IMPLANT
DRAPE C-ARM XRAY 36X54 (DRAPES) ×3 IMPLANT
DURAPREP 26ML APPLICATOR (WOUND CARE) ×3 IMPLANT
GLOVE SURG SYN 9.0  PF PI (GLOVE) ×4
GLOVE SURG SYN 9.0 PF PI (GLOVE) ×1 IMPLANT
GOWN SRG 2XL LVL 4 RGLN SLV (GOWNS) ×1 IMPLANT
GOWN STRL NON-REIN 2XL LVL4 (GOWNS) ×2
GOWN STRL REUS W/ TWL LRG LVL3 (GOWN DISPOSABLE) ×1 IMPLANT
GOWN STRL REUS W/TWL LRG LVL3 (GOWN DISPOSABLE) ×2
PACK KYPHOPLASTY (MISCELLANEOUS) ×3 IMPLANT
STRAP SAFETY 5IN WIDE (MISCELLANEOUS) ×3 IMPLANT
TRAY KYPHOPAK 15/3 EXPRESS 1ST (MISCELLANEOUS) ×1 IMPLANT
TRAY KYPHOPAK 20/3 EXPRESS 1ST (MISCELLANEOUS) ×3 IMPLANT

## 2018-02-20 NOTE — H&P (Signed)
Reviewed paper H+P, will be scanned into chart. No changes noted.  

## 2018-02-20 NOTE — Anesthesia Postprocedure Evaluation (Signed)
Anesthesia Post Note  Patient: Jenny Cooper  Procedure(s) Performed: Ronna Polio (N/A )  Patient location during evaluation: PACU Anesthesia Type: General Level of consciousness: awake and alert Pain management: pain level controlled Vital Signs Assessment: post-procedure vital signs reviewed and stable Respiratory status: spontaneous breathing and respiratory function stable Cardiovascular status: stable Anesthetic complications: no     Last Vitals:  Vitals:   02/20/18 0935 02/20/18 1207  BP: 91/76 135/66  Pulse: 61 62  Resp: 18 12  Temp: (!) 36.1 C 36.6 C  SpO2: 97% 100%    Last Pain:  Vitals:   02/20/18 1207  TempSrc:   PainSc: Asleep                 Karrie Fluellen K

## 2018-02-20 NOTE — Anesthesia Procedure Notes (Signed)
Procedure Name: MAC Performed by: Lance Muss, CRNA Pre-anesthesia Checklist: Patient identified, Emergency Drugs available, Suction available, Patient being monitored and Timeout performed Oxygen Delivery Method: Nasal cannula

## 2018-02-20 NOTE — OR Nursing (Signed)
Discharge instructions discussed with pt and son. Both voice understnding.

## 2018-02-20 NOTE — Transfer of Care (Signed)
Immediate Anesthesia Transfer of Care Note  Patient: Jenny Cooper  Procedure(s) Performed: Ronna Polio (N/A )  Patient Location: PACU  Anesthesia Type:MAC  Level of Consciousness: awake and responds to stimulation  Airway & Oxygen Therapy: Patient Spontanous Breathing and Patient connected to nasal cannula oxygen  Post-op Assessment: Report given to RN and Post -op Vital signs reviewed and stable  Post vital signs: Reviewed and stable  Last Vitals:  Vitals Value Taken Time  BP 135/66 02/20/2018 12:07 PM  Temp    Pulse 61 02/20/2018 12:07 PM  Resp 12 02/20/2018 12:07 PM  SpO2 100 % 02/20/2018 12:07 PM  Vitals shown include unvalidated device data.  Last Pain:  Vitals:   02/20/18 0935  TempSrc: Temporal  PainSc: 0-No pain         Complications: No apparent anesthesia complications

## 2018-02-20 NOTE — Anesthesia Preprocedure Evaluation (Addendum)
Anesthesia Evaluation  Patient identified by MRN, date of birth, ID band Patient awake    Reviewed: Allergy & Precautions, NPO status , Patient's Chart, lab work & pertinent test results, reviewed documented beta blocker date and time   History of Anesthesia Complications (+) history of anesthetic complications (pt complains of being dropped)  Airway Mallampati: II       Dental   Pulmonary neg sleep apnea, COPD,  COPD inhaler and oxygen dependent, former smoker,           Cardiovascular hypertension, Pt. on medications and Pt. on home beta blockers + CAD, + Past MI, + CABG and +CHF  (-) dysrhythmias + pacemaker      Neuro/Psych neg Seizures    GI/Hepatic Neg liver ROS, GERD  Medicated and Controlled,  Endo/Other  neg diabetesHypothyroidism   Renal/GU negative Renal ROS     Musculoskeletal   Abdominal   Peds  Hematology   Anesthesia Other Findings   Reproductive/Obstetrics                            Anesthesia Physical Anesthesia Plan  ASA: III  Anesthesia Plan: General   Post-op Pain Management:    Induction: Intravenous  PONV Risk Score and Plan: 3 and Ondansetron and Dexamethasone  Airway Management Planned: Nasal Cannula  Additional Equipment:   Intra-op Plan:   Post-operative Plan:   Informed Consent: I have reviewed the patients History and Physical, chart, labs and discussed the procedure including the risks, benefits and alternatives for the proposed anesthesia with the patient or authorized representative who has indicated his/her understanding and acceptance.     Plan Discussed with:   Anesthesia Plan Comments:         Anesthesia Quick Evaluation

## 2018-02-20 NOTE — Op Note (Signed)
02/20/2018  12:06 PM  PATIENT:  Jenny Cooper  82 y.o. female  PRE-OPERATIVE DIAGNOSIS:  CLOSED COMPRESSION FRACTURE OF L4  POST-OPERATIVE DIAGNOSIS:  CLOSED COMPRESSION FRACTURE OF L4  PROCEDURE:  Procedure(s): KYPHOPLASTY-L4 (N/A)  SURGEON: Laurene Footman, MD  ASSISTANTS: none  ANESTHESIA:   local and MAC  EBL:  No intake/output data recorded.  BLOOD ADMINISTERED:none  DRAINS: none   LOCAL MEDICATIONS USED:  MARCAINE    and XYLOCAINE   SPECIMEN:  Source of Specimen:  L4  DISPOSITION OF SPECIMEN:  PATHOLOGY  COUNTS:  YES  TOURNIQUET:  * No tourniquets in log *  IMPLANTS: bone cement  DICTATION: .Dragon Dictation patient was brought to the operating room and after adequate sedation was given the patient was placed prone.  C arm was brought in in good visualization and AP and lateral projections obtained.  After patient identification timeout procedures were completed local anesthetic was infiltrated on the right side at L4.  Back was prepped and draped you sterile fashion and after repeat timeout procedure a spinal needle was placed down to the ankle on the right at L4.  50-50 mix of 1% Xylocaine 8% Sensorcaine with epinephrine was infiltrated along the path of the trocar.  After allowing this to set a small incision was made and a trocar advanced into the pedicle transpedicular fashion to the body antibody biopsy was obtained of L4.  Drilling was carried out followed by inflation of the balloon to 4 cc and then infiltration of a proximally 6 and half cc into the vertebral body getting very good fill.  There is no extravasation.  After the cemented set the trochars removed and permanent serum views obtained.  The wound was closed with Dermabond followed by Band-Aid.  PLAN OF CARE: Discharge to home after PACU  PATIENT DISPOSITION:  PACU - hemodynamically stable.

## 2018-02-20 NOTE — Anesthesia Post-op Follow-up Note (Signed)
Anesthesia QCDR form completed.        

## 2018-02-20 NOTE — Discharge Instructions (Addendum)
Take it easy today and tomorrow.  Remove Band-Aid on Wednesday and then okay to shower.  Resume activities as tolerated started Wednesday.  Call for problems      AMBULATORY SURGERY  DISCHARGE INSTRUCTIONS   1) The drugs that you were given will stay in your system until tomorrow so for the next 24 hours you should not:  A) Drive an automobile B) Make any legal decisions C) Drink any alcoholic beverage   2) You may resume regular meals tomorrow.  Today it is better to start with liquids and gradually work up to solid foods.  You may eat anything you prefer, but it is better to start with liquids, then soup and crackers, and gradually work up to solid foods.   3) Please notify your doctor immediately if you have any unusual bleeding, trouble breathing, redness and pain at the surgery site, drainage, fever, or pain not relieved by medication.    4) Additional Instructions:     Please contact your physician with any problems or Same Day Surgery at [phone], Monday through Friday 6 am to 4 pm, or [place] at [facility] number at [phone].

## 2018-02-21 ENCOUNTER — Encounter: Payer: Self-pay | Admitting: Orthopedic Surgery

## 2018-02-21 LAB — SURGICAL PATHOLOGY

## 2018-02-28 ENCOUNTER — Other Ambulatory Visit: Payer: Self-pay | Admitting: Internal Medicine

## 2018-02-28 DIAGNOSIS — Z1231 Encounter for screening mammogram for malignant neoplasm of breast: Secondary | ICD-10-CM

## 2018-03-01 ENCOUNTER — Encounter: Payer: Self-pay | Admitting: Internal Medicine

## 2018-03-01 ENCOUNTER — Ambulatory Visit (INDEPENDENT_AMBULATORY_CARE_PROVIDER_SITE_OTHER): Payer: Medicare Other | Admitting: Internal Medicine

## 2018-03-01 VITALS — BP 100/62 | HR 66 | Resp 16 | Ht 66.0 in | Wt 161.0 lb

## 2018-03-01 DIAGNOSIS — I5022 Chronic systolic (congestive) heart failure: Secondary | ICD-10-CM | POA: Diagnosis not present

## 2018-03-01 DIAGNOSIS — E119 Type 2 diabetes mellitus without complications: Secondary | ICD-10-CM | POA: Diagnosis not present

## 2018-03-01 DIAGNOSIS — S32020D Wedge compression fracture of second lumbar vertebra, subsequent encounter for fracture with routine healing: Secondary | ICD-10-CM

## 2018-03-01 DIAGNOSIS — I420 Dilated cardiomyopathy: Secondary | ICD-10-CM

## 2018-03-01 MED ORDER — HYDROCODONE-ACETAMINOPHEN 5-325 MG PO TABS
1.0000 | ORAL_TABLET | Freq: Four times a day (QID) | ORAL | 0 refills | Status: DC | PRN
Start: 2018-03-01 — End: 2018-04-10

## 2018-03-01 NOTE — Patient Instructions (Signed)
Furosemide - 40 mg take one in the morning but only 1/2 in the evening Continue to skip the dose on Sunday  Check BP and oxygen levels daily - be seen if worsening

## 2018-03-01 NOTE — Progress Notes (Signed)
Date:  03/01/2018   Name:  Jenny Cooper   DOB:  Jan 25, 1936   MRN:  [id number]   Chief Complaint: Diabetes; Hypertension; and Back Pain (L4) Diabetes  She presents for her follow-up diabetic visit. Diabetes type: pre-diabetes/type 2 diet controlled. Associated symptoms include chest pain and weakness. Pertinent negatives for diabetes include no fatigue. Symptoms are stable. Current diabetic treatment includes diet. She is compliant with treatment most of the time. Her weight is stable. There is no compliance with monitoring of blood glucose. An ACE inhibitor/angiotensin II receptor blocker is being taken.   Back pain - recent kyphoplasty for compression fracture of L4.  Her pain is improved and she is still taking pain pills.  She has follow up with Ortho next week - will ask about bone density.  CHF -  Several weeks ago had left sided chest tightness that lasted about 10 minutes but occurred several times that day.  BP checked sometimes at home and is normal.  She is taking lasix 80 mg per day.  She has felt more short of breath but home O2 sats 88-91 on oxygen.  She has noticed no change in wheezing, no sputum production, no fever.  Generally feels chilled at baseline.  She has been somewhat lightheaded at times but not dizzy, no syncope.   Review of Systems  Constitutional: Positive for chills. Negative for fatigue and fever.  HENT: Negative for trouble swallowing.   Eyes: Negative for visual disturbance.  Respiratory: Positive for cough, chest tightness, shortness of breath and wheezing.   Cardiovascular: Positive for chest pain. Negative for palpitations and leg swelling.  Gastrointestinal: Negative for abdominal pain, constipation and diarrhea.  Musculoskeletal: Positive for arthralgias and back pain.  Skin: Negative for color change and rash.  Neurological: Positive for weakness.  Psychiatric/Behavioral: Negative for dysphoric mood, hallucinations and sleep disturbance.     Patient Active Problem List   Diagnosis Date Noted  . Environmental and seasonal allergies 12/20/2017  . Muscle spasms of neck 12/20/2017  . Muscle ache 09/15/2017  . Coronary artery disease of bypass graft of native heart with stable angina pectoris ([place]) 04/21/2017  . Chronic systolic heart failure ([place]) 04/14/2017  . Sinus node dysfunction ([place]) 04/06/2017  . Back pain of thoracolumbar region 03/15/2017  . PAF (paroxysmal atrial fibrillation) ([place]) 03/05/2017  . Falls frequently 03/05/2017  . Pleural effusion, right 12/23/2016  . Cancer of hilus of right lung ([place]) 12/14/2016  . Compression fracture of L2 lumbar vertebra ([place]) 08/31/2016  . Acute midline low back pain without sciatica 08/13/2016  . Fracture of multiple pubic rami, right, closed, initial encounter ([place]) 05/30/2016  . Cardiomyopathy ([place]) 04/29/2016  . Type 2 diabetes mellitus with hemoglobin A1c goal of less than 7.0% ([place]) 11/17/2015  . Hx of adenomatous colonic polyps 11/17/2015  . Radiation pneumonitis ([place]) 10/21/2015  . Mitral regurgitation   . HLD (hyperlipidemia)   . Paroxysmal supraventricular tachycardia ([place])   . NSTEMI (non-ST elevated myocardial infarction) ([place])   . Arteriosclerosis of coronary artery 01/11/2015  . Hypothyroidism (acquired) 01/11/2015  . Disorder of peripheral nervous system 10/04/2014  . Cervical radiculopathy, chronic 10/04/2014  . Lung cancer ([place]) 10/04/2014  . Atrial flutter ([place]) 11/16/2013  . Chronic obstructive pulmonary disease ([place]) 04/24/2012  . Hypertension 08/03/2011  . Pacemaker 08/03/2011    Prior to Admission medications   Medication Sig Start Date End Date Taking? Authorizing Provider  acetaminophen (TYLENOL) 500 MG tablet Take 1,000 mg by mouth every 6 (  six) hours as needed for mild pain or moderate pain.     [provider]  amiodarone (PACERONE) 200 MG tablet Take 200 mg by mouth daily.     [provider]  baclofen (LIORESAL) 10 MG  tablet Take 1 tablet (10 mg total) by mouth at bedtime as needed for muscle spasms. Patient not taking: Reported on 02/16/2018 12/20/17   Glean Hess, MD  Calcium Carb-Cholecalciferol (CALCIUM 600-D PO) Take 1 tablet by mouth every morning.     [provider]  Cetirizine HCl 10 MG CAPS Take 1 capsule (10 mg total) by mouth daily. 12/20/17   Glean Hess, MD  ELIQUIS 5 MG TABS tablet TAKE (1) TABLET BY MOUTH TWICE DAILY 02/13/18   Minna Merritts, MD  ENTRESTO 24-26 MG TAKE 1 TABLET BY MOUTH TWICE A DAY 08/08/17   Minna Merritts, MD  fluticasone furoate-vilanterol (BREO ELLIPTA) 200-25 MCG/INH AEPB Inhale 1 puff into the lungs daily as needed (wheezing or coughing).     [provider]  furosemide (LASIX) 40 MG tablet TAKE (1) TABLET BY MOUTH TWICE DAILY Patient taking differently: TAKE (1) TABLET BY MOUTH TWICE DAILY EXCEPT SUNDAY 01/12/18   Gollan, Kathlene November, MD  HYDROcodone-acetaminophen (NORCO) 5-325 MG tablet Take 1 tablet by mouth every 6 (six) hours as needed for moderate pain. 02/20/18   Hessie Knows, MD  ipratropium-albuterol (DUONEB) 0.5-2.5 (3) MG/3ML SOLN Take 3 mLs by nebulization every 6 (six) hours as needed (shortness of breath and/or wheezing). DX:J44.9 12/21/17   Flora Lipps, MD  levothyroxine (SYNTHROID, LEVOTHROID) 88 MCG tablet Take 1 tablet (88 mcg total) by mouth daily before breakfast. 12/20/17   Glean Hess, MD  methocarbamol (ROBAXIN) 500 MG tablet Take 1 tablet (500 mg total) by mouth every 8 (eight) hours as needed for muscle spasms. Patient taking differently: Take 500 mg by mouth every 12 (twelve) hours.  01/24/18   Glean Hess, MD  metoprolol succinate (TOPROL-XL) 25 MG 24 hr tablet TAKE ONE TABLET BY MOUTH EVERY MORNING AND TAKE TWO TABLETS BY MOUTH EVERY EVENING 01/12/18   Minna Merritts, MD  rosuvastatin (CRESTOR) 5 MG tablet Take 1 tablet (5 mg total) by mouth daily. 10/19/17   Minna Merritts, MD  senna (SENOKOT) 8.6 MG  TABS tablet Take 1 tablet (8.6 mg total) by mouth daily. Patient taking differently: Take 1 tablet by mouth at bedtime as needed for mild constipation.  05/27/16   Alfred Levins, [place], MD  umeclidinium bromide (INCRUSE ELLIPTA) 62.5 MCG/INH AEPB Inhale 1 puff into the lungs daily. Patient taking differently: Inhale 1 puff into the lungs daily as needed (coughing or wheezing).  02/09/17   Flora Lipps, MD    Allergies  Allergen Reactions  . Lovenox [Enoxaparin Sodium] Itching    Past Surgical History:  Procedure Laterality Date  . ABLATION  04/2016   [facility]  . APPENDECTOMY    . CARDIAC CATHETERIZATION N/A 04/28/2015   Procedure: Left Heart Cath and Coronary Angiography;  Surgeon: Wellington Hampshire, MD;  Location: [facility];  Service: Cardiovascular;  Laterality: N/A;  . CARDIOVERSION N/A 04/08/2017   Procedure: CARDIOVERSION;  Surgeon: Wellington Hampshire, MD;  Location: [facility];  Service: Cardiovascular;  Laterality: N/A;  . COLONOSCOPY  12/2009   2 small tubular adenomas  . CORONARY ARTERY BYPASS GRAFT  09/2000  . ECTOPIC PREGNANCY SURGERY    . ELECTROMAGNETIC NAVIGATION BROCHOSCOPY N/A 10/06/2015   Procedure: ELECTROMAGNETIC NAVIGATION BRONCHOSCOPY;  Surgeon: Maretta Bees  Mortimer Fries, MD;  Location: [facility];  Service: Cardiopulmonary;  Laterality: N/A;  . ENDOBRONCHIAL ULTRASOUND N/A 10/06/2015   Procedure: ENDOBRONCHIAL ULTRASOUND;  Surgeon: Flora Lipps, MD;  Location: [facility];  Service: Cardiopulmonary;  Laterality: N/A;  . KYPHOPLASTY N/A 09/28/2016   Procedure: KYPHOPLASTY;  Surgeon: Hessie Knows, MD;  Location: [facility];  Service: Orthopedics;  Laterality: N/A;  . KYPHOPLASTY N/A 02/20/2018   Procedure: [id number];  Surgeon: Hessie Knows, MD;  Location: [facility];  Service: Orthopedics;  Laterality: N/A;  . PACEMAKER INSERTION  03/2012  . thorocentesis  12/24/2016  . VAGINAL HYSTERECTOMY     partial - left ovary remains    Social History   Tobacco Use  . Smoking status: Former  Smoker    Packs/day: 1.00    Years: 40.00    Pack years: 40.00    Types: Cigarettes    Last attempt to quit: 08/30/2000    Years since quitting: 17.5  . Smokeless tobacco: Never Used  . Tobacco comment: quit smoking in 08/28/2000. Smoking cessation materials not required  Substance Use Topics  . Alcohol use: Yes    Alcohol/week: 6.0 oz    Types: 10 Glasses of wine per week    Comment: 2 glasses of wine per day  . Drug use: No     Medication list has been reviewed and updated.  Current Meds  Medication Sig  . acetaminophen (TYLENOL) 500 MG tablet Take 1,000 mg by mouth every 6 (six) hours as needed for mild pain or moderate pain.   Marland Kitchen amiodarone (PACERONE) 200 MG tablet Take 200 mg by mouth daily.   . Calcium Carb-Cholecalciferol (CALCIUM 600-D PO) Take 1 tablet by mouth every morning.   . Cetirizine HCl 10 MG CAPS Take 1 capsule (10 mg total) by mouth daily.  Marland Kitchen ELIQUIS 5 MG TABS tablet TAKE (1) TABLET BY MOUTH TWICE DAILY  . ENTRESTO 24-26 MG TAKE 1 TABLET BY MOUTH TWICE A DAY  . fluticasone furoate-vilanterol (BREO ELLIPTA) 200-25 MCG/INH AEPB Inhale 1 puff into the lungs daily as needed (wheezing or coughing).   . furosemide (LASIX) 40 MG tablet TAKE (1) TABLET BY MOUTH TWICE DAILY (Patient taking differently: TAKE (1) TABLET BY MOUTH TWICE DAILY EXCEPT SUNDAY)  . HYDROcodone-acetaminophen (NORCO) 5-325 MG tablet Take 1 tablet by mouth every 6 (six) hours as needed for moderate pain.  Marland Kitchen ipratropium-albuterol (DUONEB) 0.5-2.5 (3) MG/3ML SOLN Take 3 mLs by nebulization every 6 (six) hours as needed (shortness of breath and/or wheezing). DX:J44.9  . levothyroxine (SYNTHROID, LEVOTHROID) 88 MCG tablet Take 1 tablet (88 mcg total) by mouth daily before breakfast.  . methocarbamol (ROBAXIN) 500 MG tablet Take 1 tablet (500 mg total) by mouth every 8 (eight) hours as needed for muscle spasms. (Patient taking differently: Take 500 mg by mouth every 12 (twelve) hours. )  . metoprolol  succinate (TOPROL-XL) 25 MG 24 hr tablet TAKE ONE TABLET BY MOUTH EVERY MORNING AND TAKE TWO TABLETS BY MOUTH EVERY EVENING  . rosuvastatin (CRESTOR) 5 MG tablet Take 1 tablet (5 mg total) by mouth daily.  Marland Kitchen senna (SENOKOT) 8.6 MG TABS tablet Take 1 tablet (8.6 mg total) by mouth daily. (Patient taking differently: Take 1 tablet by mouth at bedtime as needed for mild constipation. )  . umeclidinium bromide (INCRUSE ELLIPTA) 62.5 MCG/INH AEPB Inhale 1 puff into the lungs daily. (Patient taking differently: Inhale 1 puff into the lungs daily as needed (coughing or wheezing). )  . [DISCONTINUED] HYDROcodone-acetaminophen (NORCO) 5-325 MG tablet  Take 1 tablet by mouth every 6 (six) hours as needed for moderate pain.    PHQ 2/9 Scores 09/15/2017 08/25/2017 07/28/2017 05/23/2017  PHQ - 2 Score 0 0 2 2  PHQ- 9 Score - - 10 5    Physical Exam  Constitutional: She is oriented to person, place, and time. She appears well-developed. No distress.  HENT:  Head: Normocephalic and atraumatic.  Cardiovascular: Frequent extrasystoles are present. Exam reveals distant heart sounds. Exam reveals no gallop.  Pulmonary/Chest: Effort normal. No accessory muscle usage. No respiratory distress. She has decreased breath sounds in the right lower field. She has no wheezes. She has no rhonchi. She exhibits no mass, no tenderness and no crepitus.  Musculoskeletal: She exhibits no edema.  Neurological: She is alert and oriented to person, place, and time.  Skin: Skin is warm and dry. No rash noted.  Psychiatric: She has a normal mood and affect. Her behavior is normal. Thought content normal.  Nursing note and vitals reviewed.   BP 100/62 (BP Location: Left Arm, Cuff Size: Normal)   Pulse 66   Resp 16   Ht 5\' 6"  (1.676 m)   Wt 161 lb (73 kg)   SpO2 (!) 88%   BMI 25.99 kg/m   Assessment and Plan: 1. Chronic systolic heart failure (HCC) BP low with symptoms - will decrease lasix slightly to 40 mg AM and 20 mg  PM Monitor BP at home Call or go to ED if sx are worsening or if chest pain recurs - Basic metabolic panel  2. Dilated cardiomyopathy ([place]) Suspect worsening with increase in SOB Continue O2, monitor sats Schedule follow up with Pulmonary  3. Closed compression fracture of L2 lumbar vertebra with routine healing, subsequent encounter Recent intervention appears to be helping Reviews DEXA from last year - density at spine was NORMAL - HYDROcodone-acetaminophen (NORCO) 5-325 MG tablet; Take 1 tablet by mouth every 6 (six) hours as needed for moderate pain.  Dispense: 20 tablet; Refill: 0  4. Type 2 diabetes mellitus with hemoglobin A1c goal of less than 7.0% (HCC) Continue diet - Hemoglobin A1c   Meds ordered this encounter  Medications  . HYDROcodone-acetaminophen (NORCO) 5-325 MG tablet    Sig: Take 1 tablet by mouth every 6 (six) hours as needed for moderate pain.    Dispense:  20 tablet    Refill:  0    Partially dictated using Editor, commissioning. Any errors are unintentional.  Halina Maidens, MD [facility]  03/01/2018

## 2018-03-02 LAB — BASIC METABOLIC PANEL
BUN / CREAT RATIO: 14 (ref 12–28)
BUN: 20 mg/dL (ref 8–27)
CO2: 26 mmol/L (ref 20–29)
CREATININE: 1.43 mg/dL — AB (ref 0.57–1.00)
Calcium: 9.1 mg/dL (ref 8.7–10.3)
Chloride: 99 mmol/L (ref 96–106)
GFR calc Af Amer: 40 mL/min/{1.73_m2} — ABNORMAL LOW (ref >59)
GFR calc non Af Amer: 35 mL/min/{1.73_m2} — ABNORMAL LOW (ref >59)
GLUCOSE: 90 mg/dL (ref 65–99)
Potassium: 4.3 mmol/L (ref 3.5–5.2)
Sodium: 140 mmol/L (ref 134–144)

## 2018-03-02 LAB — HEMOGLOBIN A1C
Est. average glucose Bld gHb Est-mCnc: 103 mg/dL
Hgb A1c MFr Bld: 5.2 % (ref 4.8–5.6)

## 2018-03-03 ENCOUNTER — Ambulatory Visit: Payer: Medicare Other | Admitting: Family

## 2018-03-03 ENCOUNTER — Encounter: Payer: Self-pay | Admitting: Internal Medicine

## 2018-03-06 ENCOUNTER — Ambulatory Visit
Admission: RE | Admit: 2018-03-06 | Discharge: 2018-03-06 | Disposition: A | Discharge status: Home / Self Care | Payer: Medicare Other | Source: Ambulatory Visit | Attending: Internal Medicine | Admitting: Internal Medicine

## 2018-03-06 ENCOUNTER — Ambulatory Visit (INDEPENDENT_AMBULATORY_CARE_PROVIDER_SITE_OTHER): Payer: Medicare Other | Admitting: Internal Medicine

## 2018-03-06 ENCOUNTER — Encounter: Payer: Self-pay | Admitting: Internal Medicine

## 2018-03-06 VITALS — BP 108/58 | HR 84 | Ht 66.0 in | Wt 157.8 lb

## 2018-03-06 DIAGNOSIS — R0602 Shortness of breath: Secondary | ICD-10-CM | POA: Diagnosis not present

## 2018-03-06 DIAGNOSIS — Z950 Presence of cardiac pacemaker: Secondary | ICD-10-CM | POA: Insufficient documentation

## 2018-03-06 DIAGNOSIS — J700 Acute pulmonary manifestations due to radiation: Secondary | ICD-10-CM

## 2018-03-06 DIAGNOSIS — I700 Atherosclerosis of aorta: Secondary | ICD-10-CM | POA: Diagnosis not present

## 2018-03-06 DIAGNOSIS — R0600 Dyspnea, unspecified: Secondary | ICD-10-CM

## 2018-03-06 DIAGNOSIS — J449 Chronic obstructive pulmonary disease, unspecified: Secondary | ICD-10-CM

## 2018-03-06 DIAGNOSIS — J90 Pleural effusion, not elsewhere classified: Secondary | ICD-10-CM | POA: Insufficient documentation

## 2018-03-06 DIAGNOSIS — J984 Other disorders of lung: Secondary | ICD-10-CM | POA: Diagnosis not present

## 2018-03-06 NOTE — Patient Instructions (Signed)
--  recommend that you start physical therapy for your surgery.  --If this is not started, let us know and we will start you on pulmonary rehab.

## 2018-03-06 NOTE — Progress Notes (Signed)
Date: 03/06/2018  MRN# [id number] Jenny Cooper 10-14-1935  PMD - Dr. Gayland Curry Jenny Cooper is a 82 y.o. old female seen in follow up for new LLL mass.   CC:  Chief Complaint  Patient presents with  . Acute Visit    Increased SOB with activity-has gotten worse over time. Even getting out of bed she wears out.    Synopsis - 82 year old female first evaluated by pulmonary in early 2016 for chronic cough, found to have right hilar mass, biopsy of mass and pathology specimens positive for squamous cell, stage IIIa. Now status post chemoradiation, recently found to have left lower lobe mass, with chronic cough, chronic antibiotics. Biopsy, EBUS, ENB, of left lower lobe mass negative for malignancy  Events since last clinic visit: Since the patient's last visit in the clinic here, she was admitted to the hospital from 12/11/2017 to 4/17, for COPD exacerbation.  She comes in today as a hospital follow-up, she also had a hip replacement. Since that time she feels that she is doing well. Today she feels that she is doing better, she gets dyspnea with mild exertion. She is using 2L of oxygen with rest, activity and sleep.   She is using Incruse once daily and breo once daily. She feels that they are helping. She uses her nebulizer 3 times per day. She does not smoke. She has never gone through rehab. She no longer drives.  She has not gone through PT after her surgery.   Patient presents today for follow-up visit of chronic cough, along with postradiation fibrosis. Has moderate COPD on PFT's 2 years ago Ratio 57% FEV1 58%    Allergies:  Lovenox [enoxaparin sodium]     ECHO 03/2014 MILD LV DYSFUNCTION  NORMAL RIGHT VENTRICULAR SYSTOLIC FUNCTION VALVULAR REGURGITATION: MILD MR, TRIVIAL PR, MILD TR PROSTHETIC VALVE(S): PROSTHETIC MV RING  Review of Systems:  Constitutional: Feels well. Cardiovascular: No chest pain.  Pulmonary: Denies hemoptysis.   The remainder of systems were  reviewed and were found to be negative other than what is documented in the HPI.   Physical Examination:   VS: BP (!) 108/58 (BP Location: Left Arm, Cuff Size: Normal)   Pulse 84   Ht 5\' 6"  (1.676 m)   Wt 157 lb 12.8 oz (71.6 kg)   BMI 25.47 kg/m  84% on RA. 91% on 2L General Appearance: No distress  Neuro:without focal findings, mental status, speech normal, alert and oriented HEENT: PERRLA, EOM intact Pulmonary: No wheezing, No rales  CardiovascularNormal S1,S2.  No m/r/g.  Abdomen: Benign, Soft, non-tender, No masses Renal:  No costovertebral tenderness  GU:  No performed at this time. Endoc: No evident thyromegaly, no signs of acromegaly or Cushing features Skin:   warm, no rashes, no ecchymosis  Extremities: normal, no cyanosis, clubbing.       Assessment and Plan: 82 year old female past medical history of chronic cough, moderate Gold stage B COPD, stage IIIa non-small cell lung cancer, status post chemoradiation, seen in follow-up visit for recurrent cough, suspected postradiation pneumonitis/fibrosis with allergic rhinitis with underlying;ying afib with CHF     Radiation pneumonitis ([place]) Now off of prednisone. Radiation pneumonitis likely contributing to dyspnea.   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   COPD with dyspnea on exertion.  -  Continue incruse and breo.  --Will refer to pulm rehab if not doing PT after her hip surgery.  --History of pleural effusion, requiring thoracentesis,  will check CXR to ensure this is not contibuting to dyspnea.    CHF afib  continue anticoagulation Follow up cardiology  Follow up in 3 months with Dr. Mortimer Fries.   Marda Stalker, M.D., F.C.C.P.   Board Certified in Internal Medicine, Pulmonary Medicine, [place], and Sleep Medicine.  [facility] Office Number: [phone].

## 2018-03-10 ENCOUNTER — Ambulatory Visit: Payer: Medicare Other | Admitting: Family

## 2018-03-14 ENCOUNTER — Inpatient Hospital Stay: Payer: Medicare Other | Attending: Internal Medicine | Admitting: Internal Medicine

## 2018-03-14 ENCOUNTER — Other Ambulatory Visit: Payer: Self-pay

## 2018-03-14 ENCOUNTER — Inpatient Hospital Stay: Payer: Medicare Other

## 2018-03-14 ENCOUNTER — Inpatient Hospital Stay: Admission: RE | Admit: 2018-03-14 | Payer: Medicare Other | Source: Ambulatory Visit

## 2018-03-14 ENCOUNTER — Encounter: Payer: Self-pay | Admitting: Internal Medicine

## 2018-03-14 ENCOUNTER — Ambulatory Visit
Admission: RE | Admit: 2018-03-14 | Discharge: 2018-03-14 | Disposition: A | Discharge status: Home / Self Care | Payer: Medicare Other | Source: Ambulatory Visit | Attending: Internal Medicine | Admitting: Internal Medicine

## 2018-03-14 VITALS — BP 92/61 | HR 65 | Temp 97.3°F | Resp 18 | Ht 66.0 in | Wt 156.5 lb

## 2018-03-14 DIAGNOSIS — Z85118 Personal history of other malignant neoplasm of bronchus and lung: Secondary | ICD-10-CM | POA: Insufficient documentation

## 2018-03-14 DIAGNOSIS — I509 Heart failure, unspecified: Secondary | ICD-10-CM | POA: Insufficient documentation

## 2018-03-14 DIAGNOSIS — M549 Dorsalgia, unspecified: Secondary | ICD-10-CM | POA: Diagnosis not present

## 2018-03-14 DIAGNOSIS — C3490 Malignant neoplasm of unspecified part of unspecified bronchus or lung: Secondary | ICD-10-CM

## 2018-03-14 DIAGNOSIS — Z87891 Personal history of nicotine dependence: Secondary | ICD-10-CM | POA: Insufficient documentation

## 2018-03-14 DIAGNOSIS — Z9981 Dependence on supplemental oxygen: Secondary | ICD-10-CM | POA: Insufficient documentation

## 2018-03-14 DIAGNOSIS — J9610 Chronic respiratory failure, unspecified whether with hypoxia or hypercapnia: Secondary | ICD-10-CM | POA: Diagnosis not present

## 2018-03-14 DIAGNOSIS — J449 Chronic obstructive pulmonary disease, unspecified: Secondary | ICD-10-CM

## 2018-03-14 DIAGNOSIS — J90 Pleural effusion, not elsewhere classified: Secondary | ICD-10-CM | POA: Insufficient documentation

## 2018-03-14 DIAGNOSIS — C3401 Malignant neoplasm of right main bronchus: Secondary | ICD-10-CM

## 2018-03-14 DIAGNOSIS — Z1231 Encounter for screening mammogram for malignant neoplasm of breast: Secondary | ICD-10-CM | POA: Diagnosis not present

## 2018-03-14 LAB — COMPREHENSIVE METABOLIC PANEL
ALBUMIN: 3.9 g/dL (ref 3.5–5.0)
ALT: 18 U/L (ref 0–44)
AST: 38 U/L (ref 15–41)
Alkaline Phosphatase: 95 U/L (ref 38–126)
Anion gap: 13 (ref 5–15)
BILIRUBIN TOTAL: 0.6 mg/dL (ref 0.3–1.2)
BUN: 21 mg/dL (ref 8–23)
CO2: 29 mmol/L (ref 22–32)
Calcium: 8.8 mg/dL — ABNORMAL LOW (ref 8.9–10.3)
Chloride: 96 mmol/L — ABNORMAL LOW (ref 98–111)
Creatinine, Ser: 1.34 mg/dL — ABNORMAL HIGH (ref 0.44–1.00)
GFR calc Af Amer: 42 mL/min — ABNORMAL LOW (ref >60)
GFR calc non Af Amer: 36 mL/min — ABNORMAL LOW (ref >60)
GLUCOSE: 100 mg/dL — AB (ref 70–99)
Potassium: 3.9 mmol/L (ref 3.5–5.1)
Sodium: 138 mmol/L (ref 135–145)
TOTAL PROTEIN: 7.6 g/dL (ref 6.5–8.1)

## 2018-03-14 LAB — CBC WITH DIFFERENTIAL/PLATELET
BASOS ABS: 0 10*3/uL (ref 0–0.1)
Basophils Relative: 1 %
EOS PCT: 17 %
Eosinophils Absolute: 0.7 10*3/uL (ref 0–0.7)
HCT: 39.6 % (ref 35.0–47.0)
Hemoglobin: 13.1 g/dL (ref 12.0–16.0)
LYMPHS ABS: 0.8 10*3/uL — AB (ref 1.0–3.6)
Lymphocytes Relative: 20 %
MCH: 31.4 pg (ref 26.0–34.0)
MCHC: 33 g/dL (ref 32.0–36.0)
MCV: 95.1 fL (ref 80.0–100.0)
MONO ABS: 0.5 10*3/uL (ref 0.2–0.9)
MONOS PCT: 12 %
Neutro Abs: 1.9 10*3/uL (ref 1.4–6.5)
Neutrophils Relative %: 50 %
PLATELETS: 220 10*3/uL (ref 150–440)
RBC: 4.16 MIL/uL (ref 3.80–5.20)
RDW: 14.3 % (ref 11.5–14.5)
WBC: 3.8 10*3/uL (ref 3.6–11.0)

## 2018-03-14 NOTE — Progress Notes (Signed)
[place] OFFICE PROGRESS NOTE  Patient Care Team: Glean Hess, MD as PCP - General (Internal Medicine) Minna Merritts, MD as Consulting Physician (Cardiology) Cammie Sickle, MD as Consulting Physician (Internal Medicine) Alisa Graff, FNP as Nurse Practitioner (Cardiology) Flora Lipps, MD as Consulting Physician (Pulmonary Disease) Marvia Pickles, MD as Consulting Physician (Cardiology)  Cancer Staging No matching staging information was found for the patient.   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 Biopsy from hilar area and lymph node station 4R was positive for squamous cell carcinoma.  Patient had compression of the right mainstem bronchus because of enlarged lymph node and a mass; [ T4 N1 M0 tumor stage IIIa ].  2. Started on radiation and chemotherapy from October 21, 2014 3. Finished 6 cycles of carboplatinum and Taxol  in March  29 th of 2016,  PET scan shows significant response  4. Started on consolidation chemotherapy.  Patient finished 2 cycles on July 2016 of carboplatin and Taxol. 5. Atrial fibrillation diagnosis in August of 2016 on eloquis 6. Repeat bronchoscopy was negative for any malignancy  In February 2017.  # SEP 7th 2017- CT [facility]- 5cm hilar mass; bil Ground glass opacities.   # Radiation Pneumonitis [Dr.Mungal] on Prednisone  # May 1st 2018- F one- No targettable mutations; Int-TMB; ? 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  female pleasant patient above history of stage III squamous cell lung cancer/CHF COPD is here for follow-up.  Patient interim was evaluated by orthopedics had kyphoplasty done for vertebral compression fractures.  She is currently undergoing physical therapy for that.   Back pain is improving not resolved.  She continues to have chronic shortness of breath needing oxygen.  Chronic mild cough.  No swelling in the legs.   She denies any fevers or chills.  She was recently fired by pulmonary for shortness of breath.  Prior chest x-ray done.  Review of Systems  Constitutional: Positive for malaise/fatigue. Negative for chills, diaphoresis and fever.  HENT: Negative for nosebleeds and sore throat.   Eyes: Negative for double vision.  Respiratory: Positive for cough and shortness of breath. Negative for hemoptysis, sputum production and wheezing.   Cardiovascular: Negative for chest pain, palpitations, orthopnea and leg swelling.  Gastrointestinal: Negative for abdominal pain, blood in stool, constipation, diarrhea, heartburn, melena, nausea and vomiting.  Genitourinary: Negative for dysuria, frequency and urgency.  Musculoskeletal: Positive for back pain and joint pain.  Skin: Negative.  Negative for itching and rash.  Neurological: Negative for dizziness, tingling, focal weakness, weakness and headaches.  Endo/Heme/Allergies: Does not bruise/bleed easily.  Psychiatric/Behavioral: Negative for depression. The patient is not nervous/anxious and does not have insomnia.       PAST MEDICAL HISTORY :  Past Medical History:  Diagnosis Date  . Acute respiratory failure with hypoxia ([place]) 12/11/2017  . Atypical atrial flutter ([place])    a. s/p ablation 07/27/2013 followed by Dr. Rockey Situ  . CAD (coronary artery disease)    a. s/p MI x 2 in 2002 s/p PCI x 2 in 2002; b. s/p 2v CABG 2002; c. stress echo 07/2004 w/ evi of pos & inf infarct & no evi of ischemia;  d. 4/08 dipyridamole scan w/ multiple areas of infarct, no ischemia, EF 49%; e. cath 04/28/15 3v CAD, med Rx rec, no targets for revasc, LM lum irregs, pLAD 30%, 100%, ost-pLCx 60%, [name] 99%, OM2 100%, p-mRCA 90%, [name] 100% L-R collats, VG-mLAD irregs, VG-OM2 oc  . Carcinoma of right lung ([place]) 01/03/2015   a.  followed by Dr. Oliva Bustard  . Chronic systolic CHF (congestive heart failure) ([place])    a. echo 03/2015: EF 30-35%, sev ant/inf/pos HK, in mild to mod MR  . Complication of anesthesia    more recently patient oxygen levels do not rebound as quickly  . Compressed spine fracture ([place]) 08/06/2016   lumbar 2, t11, t12  . COPD (chronic obstructive pulmonary disease) ([place])   . Fractured pelvis ([place]) 05/26/2016   2 places  . GERD (gastroesophageal reflux disease)   . History of blood clots    12/2001  . History of colonoscopy 2013  . History of mammography, screening 2015  . History of Papanicolaou smear of cervix 2013  . HLD (hyperlipidemia)   . HTN (hypertension)   . Hypothyroidism   . Lung cancer ([place])   . Mitral regurgitation    a. s/p mitral ring placement 09/2000; b. echo 09/2010: EF 50%, inf HK, post AK, mild MR, prosthetic mitral valve ring w/ peak gradient of 10 mmHg; b. echo 2/13: EF 50%, mild MR/TR     . Myocardial infarction ([place])    X 2 (LAST ONE IN 2002)  . Neuropathy   . Pacemaker    a. MDT 2002; b. generator replacement 2013; c. followed by Dr. Omelia Blackwater, MD  . PAF (paroxysmal atrial fibrillation) ([place])    a. on Eliquis   . Personal history of chemotherapy   . Personal history of radiation therapy     PAST SURGICAL HISTORY :   Past Surgical History:  Procedure Laterality Date  . ABLATION  04/2016   [facility]  . APPENDECTOMY    . CARDIAC CATHETERIZATION N/A 04/28/2015   Procedure: Left Heart Cath and Coronary Angiography;  Surgeon: Wellington Hampshire, MD;  Location: [facility];  Service: Cardiovascular;  Laterality: N/A;  . CARDIOVERSION N/A 04/08/2017   Procedure: CARDIOVERSION;  Surgeon: Wellington Hampshire, MD;  Location: [facility];  Service: Cardiovascular;  Laterality: N/A;  . COLONOSCOPY  12/2009   2 small tubular adenomas  . CORONARY ARTERY BYPASS GRAFT  09/2000  . ECTOPIC PREGNANCY SURGERY    . ELECTROMAGNETIC NAVIGATION BROCHOSCOPY N/A 10/06/2015   Procedure:  ELECTROMAGNETIC NAVIGATION BRONCHOSCOPY;  Surgeon: Flora Lipps, MD;  Location: [facility];  Service: Cardiopulmonary;  Laterality: N/A;  . ENDOBRONCHIAL ULTRASOUND N/A 10/06/2015   Procedure: ENDOBRONCHIAL ULTRASOUND;  Surgeon: Flora Lipps, MD;  Location: [facility];  Service: Cardiopulmonary;  Laterality: N/A;  . KYPHOPLASTY N/A 09/28/2016   Procedure: KYPHOPLASTY;  Surgeon: Hessie Knows, MD;  Location: [facility];  Service: Orthopedics;  Laterality: N/A;  . KYPHOPLASTY N/A 02/20/2018   Procedure: [id number];  Surgeon: Hessie Knows, MD;  Location: [facility];  Service: Orthopedics;  Laterality: N/A;  . PACEMAKER INSERTION  03/2012  . thorocentesis  12/24/2016  . VAGINAL HYSTERECTOMY     partial - left ovary remains    FAMILY HISTORY :   Family History  Problem Relation Age of Onset  . COPD Mother        sister, and brother  . Lung disease Father   . Stroke Maternal Grandmother   . Hypertension Sister   . COPD Sister   .  Hypertension Brother   . COPD Brother   . Colon cancer Brother   . Heart attack Neg Hx   . Breast cancer Neg Hx     SOCIAL HISTORY:   Social History   Tobacco Use  . Smoking status: Former Smoker    Packs/day: 1.00    Years: 40.00    Pack years: 40.00    Types: Cigarettes    Last attempt to quit: 08/30/2000    Years since quitting: 17.5  . Smokeless tobacco: Never Used  . Tobacco comment: quit smoking in 08/28/2000. Smoking cessation materials not required  Substance Use Topics  . Alcohol use: Yes    Alcohol/week: 6.0 oz    Types: 10 Glasses of wine per week    Comment: 2 glasses of wine per day  . Drug use: No    ALLERGIES:  is allergic to lovenox [enoxaparin sodium].  MEDICATIONS:  Current Outpatient Medications  Medication Sig Dispense Refill  . acetaminophen (TYLENOL) 500 MG tablet Take 1,000 mg by mouth every 6 (six) hours as needed for mild pain or moderate pain.     Marland Kitchen amiodarone (PACERONE) 200 MG tablet Take 200 mg by mouth daily.     .  Cetirizine HCl 10 MG CAPS Take 1 capsule (10 mg total) by mouth daily. 30 capsule 5  . ELIQUIS 5 MG TABS tablet TAKE (1) TABLET BY MOUTH TWICE DAILY 180 tablet 3  . ENTRESTO 24-26 MG TAKE 1 TABLET BY MOUTH TWICE A DAY 60 tablet 6  . fluticasone furoate-vilanterol (BREO ELLIPTA) 200-25 MCG/INH AEPB Inhale 1 puff into the lungs daily as needed (wheezing or coughing).     . furosemide (LASIX) 40 MG tablet TAKE (1) TABLET BY MOUTH TWICE DAILY (Patient taking differently: TAKE (1) TABLET BY MOUTH TWICE DAILY EXCEPT SUNDAY) 60 tablet 3  . HYDROcodone-acetaminophen (NORCO) 5-325 MG tablet Take 1 tablet by mouth every 6 (six) hours as needed for moderate pain. 20 tablet 0  . ipratropium-albuterol (DUONEB) 0.5-2.5 (3) MG/3ML SOLN Take 3 mLs by nebulization every 6 (six) hours as needed (shortness of breath and/or wheezing). DX:J44.9 360 mL 1  . levothyroxine (SYNTHROID, LEVOTHROID) 88 MCG tablet Take 1 tablet (88 mcg total) by mouth daily before breakfast. 30 tablet 5  . rosuvastatin (CRESTOR) 5 MG tablet Take 1 tablet (5 mg total) by mouth daily. 90 tablet 3  . Calcium Carb-Cholecalciferol (CALCIUM 600-D PO) Take 1 tablet by mouth every morning.     . methocarbamol (ROBAXIN) 500 MG tablet Take 1 tablet (500 mg total) by mouth every 8 (eight) hours as needed for muscle spasms. (Patient not taking: Reported on 03/06/2018) 60 tablet 0  . metoprolol succinate (TOPROL-XL) 25 MG 24 hr tablet TAKE ONE TABLET BY MOUTH EVERY MORNING AND TAKE TWO TABLETS BY MOUTH EVERY EVENING (Patient not taking: Reported on 03/14/2018) 90 tablet 3  . senna (SENOKOT) 8.6 MG TABS tablet Take 1 tablet (8.6 mg total) by mouth daily. (Patient not taking: Reported on 03/14/2018) 30 each 0   No current facility-administered medications for this visit.    Facility-Administered Medications Ordered in Other Visits  Medication Dose Route Frequency Provider Last Rate Last Dose  . sodium chloride 0.9 % injection 10 mL  10 mL Intravenous PRN  Forest Gleason, MD   10 mL at 02/19/15 1000  . sodium chloride flush (NS) 0.9 % injection 10 mL  10 mL Intravenous PRN Cammie Sickle, MD   10 mL at 02/16/16 1048    PHYSICAL  EXAMINATION: ECOG PERFORMANCE STATUS: 2 - Symptomatic, <50% confined to bed  BP 92/61   Pulse 65   Temp (!) 97.3 F (36.3 C) (Tympanic)   Resp 18   Ht 5' 6"  (1.676 m)   Wt 156 lb 8.4 oz (71 kg)   BMI 25.26 kg/m   Filed Weights   03/14/18 1437  Weight: 156 lb 8.4 oz (71 kg)    GENERAL: Well-nourished well-developed; Alert, no distress and comfortable.  Alone in a wheelchair. EYES: no pallor or icterus OROPHARYNX: no thrush or ulceration; NECK: supple; no lymph nodes felt. LYMPH:  no palpable lymphadenopathy in the axillary or inguinal regions LUNGS: Decreased breath sounds auscultation right side more than left no wheeze or crackles HEART/CVS: regular rate & rhythm and no murmurs; No lower extremity edema ABDOMEN:abdomen soft, non-tender and normal bowel sounds. No hepatomegaly or splenomegaly.  Musculoskeletal:no cyanosis of digits and no clubbing  PSYCH: alert & oriented x 3 with fluent speech NEURO: no focal motor/sensory deficits SKIN:  no rashes or significant lesions    LABORATORY DATA:  I have reviewed the data as listed    Component Value Date/Time   NA 138 03/14/2018 1420   NA 140 03/01/2018 1432   NA 136 12/24/2014 1457   K 3.9 03/14/2018 1420   K 3.6 12/24/2014 1457   CL 96 (L) 03/14/2018 1420   CL 98 (L) 12/24/2014 1457   CO2 29 03/14/2018 1420   CO2 32 12/24/2014 1457   GLUCOSE 100 (H) 03/14/2018 1420   GLUCOSE 107 (H) 12/24/2014 1457   BUN 21 03/14/2018 1420   BUN 20 03/01/2018 1432   BUN 17 12/24/2014 1457   CREATININE 1.34 (H) 03/14/2018 1420   CREATININE 1.00 12/24/2014 1457   CALCIUM 8.8 (L) 03/14/2018 1420   CALCIUM 9.5 12/24/2014 1457   PROT 7.6 03/14/2018 1420   PROT 7.4 12/24/2014 1457   ALBUMIN 3.9 03/14/2018 1420   ALBUMIN 3.9 12/24/2014 1457   AST 38  03/14/2018 1420   AST 22 12/24/2014 1457   ALT 18 03/14/2018 1420   ALT 19 12/24/2014 1457   ALKPHOS 95 03/14/2018 1420   ALKPHOS 65 12/24/2014 1457   BILITOT 0.6 03/14/2018 1420   BILITOT 0.4 12/24/2014 1457   GFRNONAA 36 (L) 03/14/2018 1420   GFRNONAA 55 (L) 12/24/2014 1457   GFRAA 42 (L) 03/14/2018 1420   GFRAA >60 12/24/2014 1457    No results found for: SPEP, UPEP  Lab Results  Component Value Date   WBC 3.8 03/14/2018   NEUTROABS 1.9 03/14/2018   HGB 13.1 03/14/2018   HCT 39.6 03/14/2018   MCV 95.1 03/14/2018   PLT 220 03/14/2018      Chemistry      Component Value Date/Time   NA 138 03/14/2018 1420   NA 140 03/01/2018 1432   NA 136 12/24/2014 1457   K 3.9 03/14/2018 1420   K 3.6 12/24/2014 1457   CL 96 (L) 03/14/2018 1420   CL 98 (L) 12/24/2014 1457   CO2 29 03/14/2018 1420   CO2 32 12/24/2014 1457   BUN 21 03/14/2018 1420   BUN 20 03/01/2018 1432   BUN 17 12/24/2014 1457   CREATININE 1.34 (H) 03/14/2018 1420   CREATININE 1.00 12/24/2014 1457      Component Value Date/Time   CALCIUM 8.8 (L) 03/14/2018 1420   CALCIUM 9.5 12/24/2014 1457   ALKPHOS 95 03/14/2018 1420   ALKPHOS 65 12/24/2014 1457   AST 38 03/14/2018 1420   AST  22 12/24/2014 1457   ALT 18 03/14/2018 1420   ALT 19 12/24/2014 1457   BILITOT 0.6 03/14/2018 1420   BILITOT 0.4 12/24/2014 1457       RADIOGRAPHIC STUDIES: I have personally reviewed the radiological images as listed and agreed with the findings in the report. No results found.   ASSESSMENT & PLAN:  Cancer of hilus of right lung ([place]) Stage III squamous cell lung cancer s/p chemo-RT- 2016.  Last Aril 2019- CT chest- shows no significant concerns for recurrent malignancy; shows radiation changes; however worsening pleural effusion [moderate-C discussion below].   # Clinically no evidence of progression at this time; July 2019 CXR- right sided pleural effusion [see below].  We will plan to schedule a CT scan in 3  months.  #Chronic respiratory failure-Home O2 2 L; multifactorial -COPD/CHF/mRight pleural effusion; s/p evaluation with Dr.Ram.  Chest x-ray July 2019 shows pleural effusion; however, hold off thoracentesis- as pt is clinically stable; respiratory wise.  Will defer to Dr. Juanell Fairly.  Reviewed the note from pulmonary.  Patient is recommended pulmonary rehab after finishing up physical therapy for her back.  # back pain- improving s/p kyphoplasty- awaiting to start PT.     # CKD-III creatinine 1.37 stable. sec to diuretics. Monitor for now.   #Patient follow-up with me in 3 months with labs CT scan prior.  Patient was asked to call us/pulmonary if her shortness of breath gets worse.  # I reviewed the blood work- with the patient in detail; also reviewed the imaging independently [as summarized above]; and with the patient in detail.    Cc; Dr.Kasa/Ram; Dr.Gollan.    Orders Placed This Encounter  Procedures  . CT CHEST W CONTRAST    Standing Status:   Future    Standing Expiration Date:   03/15/2019    Order Specific Question:   If indicated for the ordered procedure, I authorize the administration of contrast media per Radiology protocol    Answer:   Yes    Order Specific Question:   Preferred imaging location?    Answer:   [facility]    Order Specific Question:   Radiology Contrast Protocol - do NOT remove file path    Answer:   [email or web address]    Order Specific Question:   ** REASON FOR EXAM (FREE TEXT)    Answer:   lung cancer on surveillaince   All questions were answered. The patient knows to call the clinic with any problems, questions or concerns.      Cammie Sickle, MD 03/14/2018 5:42 PM

## 2018-03-14 NOTE — Assessment & Plan Note (Addendum)
Stage III squamous cell lung cancer s/p chemo-RT- 2016.  Last Ar[place] 2019- CT chest- shows no significant concerns for recurrent malignancy; shows radiation changes; however worsening pleural effusion [moderate-C discussion below].   # Clinically no evidence of progression at this time; July 2019 CXR- right sided pleural effusion [see below].  We will plan to schedule a CT scan in 3 months.  #Chronic respiratory failure-Home O2 2 L; multifactorial -COPD/CHF/mRight pleural effusion; s/p evaluation with Dr.Ram.  Chest x-ray July 2019 shows pleural effusion; however, hold off thoracentesis- as pt is clinically stable; respiratory wise.  Will defer to Dr. Juanell Fairly.  Reviewed the note from pulmonary.  Patient is recommended pulmonary rehab after finishing up physical therapy for her back.  # back pain- improving s/p kyphoplasty- awaiting to start PT.     # CKD-III creatinine 1.37 stable. sec to diuretics. Monitor for now.   #Patient follow-up with me in 3 months with labs CT scan prior.  Patient was asked to call us/pulmonary if her shortness of breath gets worse.  # I reviewed the blood work- with the patient in detail; also reviewed the imaging independently [as summarized above]; and with the patient in detail.    Cc; Dr.Kasa/Ram; Dr.Gollan.

## 2018-03-17 ENCOUNTER — Other Ambulatory Visit: Payer: Self-pay | Admitting: Cardiovascular Disease

## 2018-03-23 ENCOUNTER — Encounter: Payer: Self-pay | Admitting: Radiation Oncology

## 2018-03-23 ENCOUNTER — Ambulatory Visit
Admission: RE | Admit: 2018-03-23 | Discharge: 2018-03-23 | Disposition: A | Discharge status: Home / Self Care | Payer: Medicare Other | Source: Ambulatory Visit | Attending: Radiation Oncology | Admitting: Radiation Oncology

## 2018-03-23 ENCOUNTER — Other Ambulatory Visit: Payer: Self-pay

## 2018-03-23 VITALS — BP 91/66 | HR 77 | Temp 97.6°F | Resp 18 | Wt 158.4 lb

## 2018-03-23 DIAGNOSIS — C3491 Malignant neoplasm of unspecified part of right bronchus or lung: Secondary | ICD-10-CM | POA: Insufficient documentation

## 2018-03-23 DIAGNOSIS — R05 Cough: Secondary | ICD-10-CM | POA: Diagnosis not present

## 2018-03-23 DIAGNOSIS — Z923 Personal history of irradiation: Secondary | ICD-10-CM | POA: Diagnosis not present

## 2018-03-23 DIAGNOSIS — Z9221 Personal history of antineoplastic chemotherapy: Secondary | ICD-10-CM | POA: Diagnosis not present

## 2018-03-23 DIAGNOSIS — Z993 Dependence on wheelchair: Secondary | ICD-10-CM | POA: Diagnosis not present

## 2018-03-23 NOTE — Progress Notes (Signed)
Radiation Oncology Follow up Note  Name: Jenny Cooper   Date:   03/23/2018 MRN:  [id number] DOB: Dec 23, 1935    This 82 y.o. female presents to the clinic today for 3 year follow-up status post concurrent chemotherapy radiation therapy for stage IIIa squamous cell carcinoma the right lung hilum.  REFERRING PROVIDER: Glean Hess, MD  HPI: patient is a 82 year old female now out 3 years having completed combined modality treatment with chemoradiation for stage IIIa squamous cell carcinoma the right hilum..she seen today in routine follow-up is doing fairly well. Has been seeing pulmonology to tune up some of her lung functions. She had a CT scan of the chest back in April 2019 which I've reviewed showing no evidence for persistent or recurrent disease. She had a chest x-ray this month showing right-sided pleural effusion CT scan of the chest for follow-up has been scheduled in 3 months. She does have a mild nonproductive cough. She specifically denies hemoptysis.  COMPLICATIONS OF TREATMENT: none  FOLLOW UP COMPLIANCE: keeps appointments   PHYSICAL EXAM:  BP 91/66   Pulse 77   Temp 97.6 F (36.4 C)   Resp 18   Wt 158 lb 6.4 oz (71.9 kg)   BMI 25.57 kg/m  Well-developed female wheelchair-bound in NAD. She does have Port-A-Cath and right anterior chest as well as a pacemaker in her left anterior chest.Well-developed well-nourished patient in NAD. HEENT reveals PERLA, EOMI, discs not visualized.  Oral cavity is clear. No oral mucosal lesions are identified. Neck is clear without evidence of cervical or supraclavicular adenopathy. Lungs are clear to A&P. Cardiac examination is essentially unremarkable with regular rate and rhythm without murmur rub or thrill. Abdomen is benign with no organomegaly or masses noted. Motor sensory and DTR levels are equal and symmetric in the upper and lower extremities. Cranial nerves II through XII are grossly intact. Proprioception is intact. No  peripheral adenopathy or edema is identified. No motor or sensory levels are noted. Crude visual fields are within normal range.  RADIOLOGY RESULTS: chest x-ray and CT scans reviewed  PLAN: present time patient is doing well with no evidence of progressive or recurrent disease. She continues close follow-up care with medical oncology. Follow-up CT scan for October has been ordered. I have asked to see her back in 1 year for follow-up. Patient family know to call with any concerns at any time.  I would like to take this opportunity to thank you for allowing me to participate in the care of your patient.Noreene Filbert, MD

## 2018-03-28 ENCOUNTER — Encounter: Payer: Self-pay | Admitting: Internal Medicine

## 2018-03-29 ENCOUNTER — Encounter: Payer: Self-pay | Admitting: Internal Medicine

## 2018-04-05 ENCOUNTER — Other Ambulatory Visit: Payer: Self-pay | Admitting: *Deleted

## 2018-04-05 DIAGNOSIS — J449 Chronic obstructive pulmonary disease, unspecified: Secondary | ICD-10-CM

## 2018-04-09 NOTE — Progress Notes (Signed)
Patient ID: Jenny Cooper, female   DOB: 25-Aug-1936, 82 y.o.   MRN: [id number] Cardiology Office Note  Date:  04/10/2018   ID:  Murriel, Holwerda 12/13/1935, MRN [id number]  PCP:  Glean Hess, MD   Chief Complaint  Patient presents with  . other    6 month follow up.medications verbally reviewed     HPI:  82 y.o. female with h/o  CAD  s/p remote history of MI in 2002  2 vessel CABG post stenting in 2002,  mitral valve repair in 2002 secondary to mitral regurgitation,  PAF s/p prior TEE/DCCV on Eliquis,  atypical atrial flutter s/p ablation on 07/27/2013  atrial fibrillation and typical atrial flutter ablation on 9/11/2017at [facility] s/p MDT PPM,   COPD,  HTN,   HLD  [facility] on 04/01/15 with increased SOB, PNA, elevated troponin.  Quit smoking in 2001 carcinoma of right lung, has completed chemotherapy  falls, and chronic back pain from collapsed vertebrae Suffered pelvic fracture after fall in September 2017 She presents today to the clinic for follow-up of her atrial fibrillation and CAD  In follow-up today she reports having chronic shortness of breath though feels it is stable On chronic oxygen Reports having recent change to her thyroid medication TSH lab work has been requested from primary care Reports that she is followed by Dr. Mortimer Fries for pulmonary issues Leg weakness chronic issue Presents without a cane or walker On today's visit  Problem on Lipitor, muscles feel better without it She does not feel that she is having any atrial fibrillation EP reviewed from February 2019 at [facility] documenting minimal atrial fibrillation Unable to review pacer download from May 2019 Compliant with her eliquis  EKG personally reviewed by myself on todays visit Shows ventricular rate paced complexes,  atrial paced complexes Baseline artifact   Other past medical history reviewed On a prior clinic office she was in atrial flutter with rapid rate Started on amiodarone  after discussion with [facility] EP She had worsening shortness of breath, presented to the emergency room Admitted to the hospital with Cardioversion, 04/08/17 Lasix IV for management of her CHF  Echocardiogram showing EF 30 to 35%, moderately elevated RVSP  Cardioversion at the end of May 2018 for atrial flutter Since that time she has felt relatively well, recent vacation in [place] the past week Reports that she did not feel right at times but unable to put her finger on close going on  Previously on prednisone for shortness of breath, managed by pulmonary  CT scan September 2016 showing improvement of her lung cancer She finished her cancer treatment over the summer 2016, she had chemotherapy and radiation  TEE/DCCV in 2013 for her PAF. In 2014 she was diagnosed with atypical atrial flutter and underwent ablation. underwent PPM generator change in 2013.   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  finished concurrent chemoradiation with carboplatinum and Taxol and PET scan showed significant response.   present to [facility] on 8/2. She complained of increase SOB, cough that was productive of green to yellow sputum, nausea, and vomiting.  required BiPAP for a short time upon her arrival. CXR showed bibasilar airspace disease right greater than left has progressed significantly from the prior study, worrisome for recurrent carcinoma however pneumonia could also have this appearance. CT chest has been ordered.   Troponin was found to be 0.48-->1.83.  Echo showed EF 30-35%, severe anterior and infero/posterior wall HK. Left  ventricular function parameters were normal, mild to moderate MR. Left atrium was mildly dilated. RV systolic function was normal. Mild to moderate TR. PASP was moderately to severely elevated at 60 mm Hg.   outpatient cardiac cath on 04/28/2015 that showed 3 vessel CAD with patent SVG to LAD, occluded SVG to OM, native RCA had  severe ISR in the mid-segment and was occluded distally at the sites of previously placed stents. There were left to right collaterals. Moderately reduced LVSF with EF of 35-40%. Mildly elevated LVEDP. There were no good targets for revascularization. medical therapy.   cardiac event monitor to evaluate her Afib burden that showed 50% Afib burden with a peak heart rate of 116, mostly rate controlled. Given this finding her Toprol was titrated up on 9/6 to 50 mg bid.   PMH:   has a past medical history of Acute respiratory failure with hypoxia ([place]) (12/11/2017), Atypical atrial flutter ([place]), CAD (coronary artery disease), Carcinoma of right lung ([place]) (12/01/3152), Chronic systolic CHF (congestive heart failure) ([place]), Complication of anesthesia, Compressed spine fracture ([place]) (08/06/2016), COPD (chronic obstructive pulmonary disease) ([place]), Fractured pelvis ([place]) (05/26/2016), GERD (gastroesophageal reflux disease), History of blood clots, History of colonoscopy (2013), History of mammography, screening (2015), History of Papanicolaou smear of cervix (2013), HLD (hyperlipidemia), HTN (hypertension), Hypothyroidism, Lung cancer ([place]), Mitral regurgitation, Myocardial infarction ([place]), Neuropathy, Pacemaker, PAF (paroxysmal atrial fibrillation) ([place]), Personal history of chemotherapy, and Personal history of radiation therapy.  PSH:    Past Surgical History:  Procedure Laterality Date  . ABLATION  04/2016   [facility]  . APPENDECTOMY    . CARDIAC CATHETERIZATION N/A 04/28/2015   Procedure: Left Heart Cath and Coronary Angiography;  Surgeon: Wellington Hampshire, MD;  Location: [place] CV LAB;  Service: Cardiovascular;  Laterality: N/A;  . CARDIOVERSION N/A 04/08/2017   Procedure: CARDIOVERSION;  Surgeon: Wellington Hampshire, MD;  Location: [facility] ORS;  Service: Cardiovascular;  Laterality: N/A;  . COLONOSCOPY  12/2009   2 small tubular adenomas  . CORONARY ARTERY BYPASS GRAFT  09/2000  . ECTOPIC PREGNANCY  SURGERY    . ELECTROMAGNETIC NAVIGATION BROCHOSCOPY N/A 10/06/2015   Procedure: ELECTROMAGNETIC NAVIGATION BRONCHOSCOPY;  Surgeon: Flora Lipps, MD;  Location: [facility] ORS;  Service: Cardiopulmonary;  Laterality: N/A;  . ENDOBRONCHIAL ULTRASOUND N/A 10/06/2015   Procedure: ENDOBRONCHIAL ULTRASOUND;  Surgeon: Flora Lipps, MD;  Location: [facility] ORS;  Service: Cardiopulmonary;  Laterality: N/A;  . KYPHOPLASTY N/A 09/28/2016   Procedure: KYPHOPLASTY;  Surgeon: Hessie Knows, MD;  Location: [facility] ORS;  Service: Orthopedics;  Laterality: N/A;  . KYPHOPLASTY N/A 02/20/2018   Procedure: [id number];  Surgeon: Hessie Knows, MD;  Location: [facility] ORS;  Service: Orthopedics;  Laterality: N/A;  . PACEMAKER INSERTION  03/2012  . thorocentesis  12/24/2016  . VAGINAL HYSTERECTOMY     partial - left ovary remains    Current Outpatient Medications  Medication Sig Dispense Refill  . acetaminophen (TYLENOL) 500 MG tablet Take 1,000 mg by mouth every 6 (six) hours as needed for mild pain or moderate pain.     Marland Kitchen amiodarone (PACERONE) 200 MG tablet Take 200 mg by mouth daily.     . Calcium Carb-Cholecalciferol (CALCIUM 600-D PO) Take 1 tablet by mouth every morning.     . Cetirizine HCl 10 MG CAPS Take 1 capsule (10 mg total) by mouth daily. 30 capsule 5  . ELIQUIS 5 MG TABS tablet TAKE (1) TABLET BY MOUTH TWICE DAILY 180 tablet 3  . ENTRESTO 24-26 MG TAKE (1) TABLET  BY MOUTH TWICE DAILY 60 tablet 6  . fluticasone furoate-vilanterol (BREO ELLIPTA) 200-25 MCG/INH AEPB Inhale 1 puff into the lungs daily as needed (wheezing or coughing).     . furosemide (LASIX) 40 MG tablet TAKE (1) TABLET BY MOUTH TWICE DAILY (Patient taking differently: TAKE (1) TABLET BY MOUTH TWICE DAILY EXCEPT SUNDAY) 60 tablet 3  . ipratropium-albuterol (DUONEB) 0.5-2.5 (3) MG/3ML SOLN Take 3 mLs by nebulization every 6 (six) hours as needed (shortness of breath and/or wheezing). DX:J44.9 360 mL 1  . levothyroxine (SYNTHROID, LEVOTHROID) 88 MCG tablet  Take 1 tablet (88 mcg total) by mouth daily before breakfast. 30 tablet 5  . methocarbamol (ROBAXIN) 500 MG tablet Take 1 tablet (500 mg total) by mouth every 8 (eight) hours as needed for muscle spasms. 60 tablet 0  . metoprolol succinate (TOPROL-XL) 25 MG 24 hr tablet TAKE ONE TABLET BY MOUTH EVERY MORNING AND TAKE TWO TABLETS BY MOUTH EVERY EVENING 90 tablet 3  . rosuvastatin (CRESTOR) 5 MG tablet Take 1 tablet (5 mg total) by mouth daily. 90 tablet 3  . senna (SENOKOT) 8.6 MG TABS tablet Take 1 tablet (8.6 mg total) by mouth daily. 30 each 0   No current facility-administered medications for this visit.    Facility-Administered Medications Ordered in Other Visits  Medication Dose Route Frequency Provider Last Rate Last Dose  . sodium chloride 0.9 % injection 10 mL  10 mL Intravenous PRN Forest Gleason, MD   10 mL at 02/19/15 1000  . sodium chloride flush (NS) 0.9 % injection 10 mL  10 mL Intravenous PRN Cammie Sickle, MD   10 mL at 02/16/16 1048     Allergies:   Lovenox [enoxaparin sodium]   Social History:  The patient  reports that she quit smoking about 17 years ago. Her smoking use included cigarettes. She has a 40.00 pack-year smoking history. She has never used smokeless tobacco. She reports that she drinks about 10.0 standard drinks of alcohol per week. She reports that she does not use drugs.   Family History:   family history includes COPD in her brother, mother, and sister; Colon cancer in her brother; Hypertension in her brother and sister; Lung disease in her father; Stroke in her maternal grandmother.    Review of Systems: Review of Systems  Constitutional: Negative.   Respiratory: Positive for shortness of breath.   Cardiovascular: Negative.   Gastrointestinal: Negative.   Musculoskeletal: Positive for back pain and joint pain.       Leg weakness  Neurological: Negative.   Psychiatric/Behavioral: Negative.   All other systems reviewed and are  negative.    PHYSICAL EXAM: VS:  BP 112/60 (BP Location: Left Arm, Patient Position: Sitting, Cuff Size: Normal)   Pulse 60   Ht 5\' 6"  (1.676 m)   Wt 156 lb 4 oz (70.9 kg)   BMI 25.22 kg/m  , BMI Body mass index is 25.22 kg/m. Constitutional:  oriented to person, place, and time. No distress.  HENT:  Head: Normocephalic and atraumatic.  Eyes:  no discharge. No scleral icterus.  Neck: Normal range of motion. Neck supple. No JVD present.  Cardiovascular: Normal rate, regular rhythm, normal heart sounds and intact distal pulses. Exam reveals no gallop and no friction rub. No edema No murmur heard. Pulmonary/Chest: moderately decreased breath sounds bilaterally throughout, No stridor. No respiratory distress.  no wheezes.  no rales.  no tenderness.  Abdominal: Soft.  no distension.  no tenderness.  Musculoskeletal: Normal range of  motion.  no  tenderness or deformity.  Neurological:  normal muscle tone. Coordination normal. No atrophy Skin: Skin is warm and dry. No rash noted. not diaphoretic.  Psychiatric:  normal mood and affect. behavior is normal. Thought content normal.    Recent Labs: 08/25/2017: TSH 8.530 09/13/2017: Magnesium 2.0 12/11/2017: B Natriuretic Peptide 900.0 03/14/2018: ALT 18; BUN 21; Creatinine, Ser 1.34; Hemoglobin 13.1; Platelets 220; Potassium 3.9; Sodium 138    Lipid Panel Lab Results  Component Value Date   CHOL 131 08/25/2017   HDL 36 (L) 08/25/2017   LDLCALC 63 08/25/2017   TRIG 159 (H) 08/25/2017      Wt Readings from Last 3 Encounters:  04/10/18 156 lb 4 oz (70.9 kg)  03/23/18 158 lb 6.4 oz (71.9 kg)  03/14/18 156 lb 8.4 oz (71 kg)       ASSESSMENT AND PLAN:  Essential hypertension -  Blood pressure is borderline low.  No changes made to the medications. No orthostasis symptoms  Paroxysmal atrial fibrillation (HCC) - Previous ablation at [facility] On anticoagulation She has close follow-up with [facility] EP On EKG appears to have atrial paced  and ventricularly paced complexes. Significant baseline artifact. If she is having paroxysmal atrial fibrillation she is unaware of it Discussed amiodarone surveillance. Thyroid labs have been requested from primary care Recent liver function is normal Will send a request to pulmonary for consideration of PFTs  Atrial flutter Followed by Dr. Glennon Mac, EP at [facility] Previous cardioversion in the hospital  stay on amiodarone 200 mg daily for now Unable to exclude paroxysmal episodes , will defer to EP She is asymptomatic, feels amiodarone is working well  Hyperlipidemia - Crestor 5 mg daily Goal LDL less than 70  Chronic systolic CHF (congestive heart failure) (HCC) Continue Lasix 40 twice daily She does report some episodes of shortness of breath, suspect secondary to underlying COPD Recommend she try her nebulizer  DM type 2, goal A1c below 7 Continue aggressive diet Unable to exercise, stable  COPD exacerbation ([place]) Previously treated with prednisone Followed by pulmonary Recommend nebulizer as needed Feels her shortness of breath is stable  Chronic back pain Completed physical therapy, scheduled to see orthopedics   Total encounter time more than 25 minutes  Greater than 50% was spent in counseling and coordination of care with the patient   Disposition:   F/U  12 months   Orders Placed This Encounter  Procedures  . EKG 12-Lead     Signed, Esmond Plants, M.D., Ph.D. 04/10/2018  [place], [place]

## 2018-04-10 ENCOUNTER — Ambulatory Visit (INDEPENDENT_AMBULATORY_CARE_PROVIDER_SITE_OTHER): Payer: Medicare Other | Admitting: Cardiovascular Disease

## 2018-04-10 ENCOUNTER — Encounter: Payer: Self-pay | Admitting: Cardiovascular Disease

## 2018-04-10 VITALS — BP 112/60 | HR 60 | Ht 66.0 in | Wt 156.2 lb

## 2018-04-10 DIAGNOSIS — E119 Type 2 diabetes mellitus without complications: Secondary | ICD-10-CM

## 2018-04-10 DIAGNOSIS — J449 Chronic obstructive pulmonary disease, unspecified: Secondary | ICD-10-CM | POA: Diagnosis not present

## 2018-04-10 DIAGNOSIS — I480 Paroxysmal atrial fibrillation: Secondary | ICD-10-CM

## 2018-04-10 DIAGNOSIS — I10 Essential (primary) hypertension: Secondary | ICD-10-CM

## 2018-04-10 DIAGNOSIS — I5022 Chronic systolic (congestive) heart failure: Secondary | ICD-10-CM

## 2018-04-10 DIAGNOSIS — I483 Typical atrial flutter: Secondary | ICD-10-CM

## 2018-04-10 DIAGNOSIS — I420 Dilated cardiomyopathy: Secondary | ICD-10-CM

## 2018-04-10 DIAGNOSIS — I25118 Atherosclerotic heart disease of native coronary artery with other forms of angina pectoris: Secondary | ICD-10-CM

## 2018-04-10 DIAGNOSIS — Z950 Presence of cardiac pacemaker: Secondary | ICD-10-CM

## 2018-04-10 NOTE — Patient Instructions (Addendum)
We will request TSH labs from PMD I will send Dr. Mortimer Fries a message about PFT  Medication Instructions:   No medication changes made  Labwork:  No new labs needed  Testing/Procedures:  No further testing at this time   Follow-Up: It was a pleasure seeing you in the office today. Please call us if you have new issues that need to be addressed before your next appt.  [phone]  Your physician wants you to follow-up in: 12 months.  You will receive a reminder letter in the mail two months in advance. If you don't receive a letter, please call our office to schedule the follow-up appointment.  If you need a refill on your cardiac medications before your next appointment, please call your pharmacy.  For educational health videos Log in to : [email or web address] Or : [email or web address], password : [email or web address]

## 2018-04-12 ENCOUNTER — Encounter: Payer: Self-pay | Admitting: *Deleted

## 2018-04-12 ENCOUNTER — Telehealth: Payer: Self-pay | Admitting: Internal Medicine

## 2018-04-12 ENCOUNTER — Other Ambulatory Visit: Payer: Self-pay | Admitting: *Deleted

## 2018-04-12 NOTE — Telephone Encounter (Signed)
Patient returning call.

## 2018-04-12 NOTE — Telephone Encounter (Signed)
Called patient to make aware she will need another office visit in which POC walk can be ordered. Will send mychart message.

## 2018-04-12 NOTE — Telephone Encounter (Signed)
Pt will have done next office visit. Nothing further needed.

## 2018-04-27 ENCOUNTER — Other Ambulatory Visit: Payer: Self-pay | Admitting: Cardiovascular Disease

## 2018-05-12 ENCOUNTER — Telehealth: Payer: Self-pay | Admitting: Internal Medicine

## 2018-05-12 ENCOUNTER — Ambulatory Visit (INDEPENDENT_AMBULATORY_CARE_PROVIDER_SITE_OTHER): Payer: Medicare Other | Admitting: Internal Medicine

## 2018-05-12 ENCOUNTER — Encounter: Payer: Self-pay | Admitting: Internal Medicine

## 2018-05-12 VITALS — BP 82/60 | HR 62 | Ht 66.0 in

## 2018-05-12 DIAGNOSIS — I25118 Atherosclerotic heart disease of native coronary artery with other forms of angina pectoris: Secondary | ICD-10-CM | POA: Diagnosis not present

## 2018-05-12 DIAGNOSIS — J441 Chronic obstructive pulmonary disease with (acute) exacerbation: Secondary | ICD-10-CM

## 2018-05-12 MED ORDER — GUAIFENESIN-CODEINE 100-10 MG/5ML PO SOLN
5.0000 mL | ORAL | 0 refills | Status: DC | PRN
Start: 2018-05-12 — End: 2018-06-13

## 2018-05-12 MED ORDER — AZITHROMYCIN 250 MG PO TABS: ORAL_TABLET | ORAL | 0 refills | Status: DC

## 2018-05-12 MED ORDER — PREDNISONE 20 MG PO TABS
20.0000 mg | ORAL_TABLET | Freq: Every day | ORAL | 0 refills | Status: DC
Start: 2018-05-12 — End: 2018-06-13

## 2018-05-12 MED ORDER — BUDESONIDE 0.5 MG/2ML IN SUSP: 0.5000 mg | Freq: Two times a day (BID) | RESPIRATORY_TRACT | 5 refills | Status: DC

## 2018-05-12 MED ORDER — REVEFENACIN 175 MCG/3ML IN SOLN: 3.0000 mL | Freq: Every day | RESPIRATORY_TRACT | 11 refills | Status: DC

## 2018-05-12 NOTE — Progress Notes (Signed)
Date: 05/12/2018  MRN# [id number] Jenny Cooper [birth year]  PMD - Dr. Gayland Curry Jenny Cooper is a 82 y.o. old female seen in follow up for  LLL mass/COPD/Fibrosis    Synopsis - 82 year old female first evaluated by pulmonary in early 2016 for chronic cough, found to have right hilar mass, biopsy of mass and pathology specimens positive for squamous cell, stage IIIa. Now status post chemoradiation, recently found to have left lower lobe mass, with chronic cough, chronic antibiotics. Biopsy, EBUS, ENB, of left lower lobe mass negative for malignancy  chronic cough, along with postradiation fibrosis. 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 Status post bronchoscopy in February 2017-negative for malignancy; May 5th PET scan shows no significant concerns for recurrent malignancy; shows radiation changes; pleural effusion   CC Follow up cough and SOB   HPI Patient has been having increased shortness of breath and dyspnea on exertion She states she feels puffiness in her face Has increased cough and increased wheezing over the last several months Has a productive cough with yellow sputum  I have advised that she may need prednisone however she states that she has an adverse reaction to prednisone therapy wears her A. fib goes out of control She is currently on amiodarone therapy At this time she does not want prednisone therapy  I do believe that her respiratory capacity has declined over the last several months and she is not able to take her inhaler therapy at this time I have explained to her that she will need nebulized therapy in order to get maximum benefit     No obvious signs of infection at this time No obvious signs of acute heart failure at this time  Allergies:  Lovenox [enoxaparin sodium]    Review of Systems: Gen:  Denies  fever, sweats, chills HEENT: Denies  blurred vision, double vision. bleeds, sore throat Cvc:  No dizziness, chest pain. Resp:    +cough +sputum production, +shortness of breath,+wheezing, -hemoptysis,  Gi: Denies swallowing difficulty, stomach pain. Gu:  Denies bladder incontinence, burning urine Ext:   No Joint pain, stiffness. Skin: No skin rash,  hives  Endoc:  No polyuria, polydipsia. Psych: No depression, insomnia. Other:  All other systems were reviewed with the patient and were negative other that what is mentioned in the HPI.        General Appearance: No distress  Neuro:without focal findings,  speech normal,  HEENT: PERRLA, EOM intact.   Pulmonary: normal breath sounds, No wheezing. CTA B/L  CardiovascularNormal S1,S2.  No m/r/g.   Abdomen: Benign, Soft, non-tender. Renal:  No costovertebral tenderness  GU:  No performed at this time. Endoc: No evident thyromegaly, no signs of acromegaly. Skin:   warm, no rashes, no ecchymosis  Extremities: normal, no cyanosis, clubbing. PSYCH-no depression, no anxiety       Physical Examination:  BP (!) 82/60 (BP Location: Left Arm, Cuff Size: Normal)   Pulse 62   Ht 5\' 6"  (1.676 m)   SpO2 90%   BMI 25.22 kg/m        ECHO 03/2014 MILD LV DYSFUNCTION  NORMAL RIGHT VENTRICULAR SYSTOLIC FUNCTION VALVULAR REGURGITATION: MILD MR, TRIVIAL PR, MILD TR PROSTHETIC VALVE(S): PROSTHETIC MV RING   CT chest 11/2017 Right hilar and suprahilar scarring associated with scarring in the medial right lung, likely treatment related from prior radiation therapy.    Assessment and Plan:  82 year old female past medical history of chronic cough, moderate Gold stage B COPD, stage IIIa non-small cell lung cancer, status post chemoradiation, seen in follow-up visit for chronic SOB and DOE that has been progressively getting worse last several months in the setting of suspected postradiation pneumonitis/fibrosis with allergic rhinitis with underlying afib with CHF with chronic  hypoxic respiratory failure  At this time patient has significant respiratory insufficiency and may not be able to take her inhaler therapy as prescribed have explained to her that she will need nebulized therapy from here on out which includes Pulmicort nebulizers as well as YUPELRI which is long-acting muscarinic bronchodilator neb therapy  Radiation pneumonitis (HCC)-patient is to have CT chest repeated in the next several months for interval assessment At this time she does not need prednisone therapy   Cough Patient has several reasons for her cough including post radiation pneumonitis fibrosis architectural distortion with anatomical distortion of the right mainstem and hilum with a history of left lower lobe mass along with COPD I have ordered Robitussin with codeine as needed Continue neb therapy as prescribed  COPD moderate to severe Gold stage C Patient needs to change her inhaler therapy Stop Breo stop Incruse Start Pulmicort nebs Start Yupelri nebs We will reassess her respiratory status after initiation of therapy   Chronic hypoxic respiratory failure Patient needs continuous oxygen therapy 2 L nasal cannula with exertion and at nighttime She uses and benefits from oxygen therapy   CHF afib  continue anticoagulation Follow up cardiology  Lung Cancer Follow up Oncology as scheduled  Follow up in 6 months  Patient satisfied with Plan of action and management. All questions answered  Corrin Parker, M.D.  [facility]  Medical Director [place] Director [facility] Cardio-Pulmonary Department

## 2018-05-12 NOTE — Telephone Encounter (Signed)
Please call regarding quantity and directions for prednisone.

## 2018-05-12 NOTE — Telephone Encounter (Signed)
Answered pharmacy's questions. Nothing further needed.

## 2018-05-12 NOTE — Patient Instructions (Addendum)
Patient does NOT tolerate Prednisone but will give back up prescription  Will start Z Pak  Will stop all inhalers( stop BREO and Stop Incruse)  START PULMICORT NEBS TWICE DAILY START YUPELRI ONCE DAILY

## 2018-05-15 ENCOUNTER — Other Ambulatory Visit: Payer: Self-pay | Admitting: Cardiovascular Disease

## 2018-05-15 ENCOUNTER — Telehealth: Payer: Self-pay | Admitting: Internal Medicine

## 2018-05-15 NOTE — Telephone Encounter (Signed)
LMOVM for Joycelyn Schmid to call back.

## 2018-05-15 NOTE — Telephone Encounter (Signed)
Margaret from [facility] in regards to a new script for [place] to go over information with nurse Please call at [phone]

## 2018-05-16 ENCOUNTER — Encounter: Payer: Self-pay | Admitting: Internal Medicine

## 2018-05-16 NOTE — Telephone Encounter (Signed)
LMOVM for Jenny Cooper to return my call. Will close encounter at this time since all information has been faxed.

## 2018-05-16 NOTE — Telephone Encounter (Signed)
This encounter was created in error - please disregard.

## 2018-06-06 ENCOUNTER — Ambulatory Visit
Admission: RE | Admit: 2018-06-06 | Discharge: 2018-06-06 | Disposition: A | Discharge status: Home / Self Care | Payer: Medicare Other | Source: Ambulatory Visit | Attending: Internal Medicine | Admitting: Internal Medicine

## 2018-06-06 ENCOUNTER — Ambulatory Visit: Admission: RE | Admit: 2018-06-06 | Payer: Medicare Other | Source: Ambulatory Visit

## 2018-06-06 DIAGNOSIS — J439 Emphysema, unspecified: Secondary | ICD-10-CM | POA: Insufficient documentation

## 2018-06-06 DIAGNOSIS — C3401 Malignant neoplasm of right main bronchus: Secondary | ICD-10-CM | POA: Diagnosis not present

## 2018-06-06 DIAGNOSIS — R918 Other nonspecific abnormal finding of lung field: Secondary | ICD-10-CM | POA: Insufficient documentation

## 2018-06-06 DIAGNOSIS — J90 Pleural effusion, not elsewhere classified: Secondary | ICD-10-CM | POA: Insufficient documentation

## 2018-06-06 DIAGNOSIS — I700 Atherosclerosis of aorta: Secondary | ICD-10-CM | POA: Insufficient documentation

## 2018-06-06 LAB — POCT I-STAT CREATININE: CREATININE: 1.4 mg/dL — AB (ref 0.44–1.00)

## 2018-06-06 MED ORDER — IOHEXOL 300 MG/ML  SOLN
60.0000 mL | Freq: Once | INTRAMUSCULAR | Status: AC | PRN
  Administered 2018-06-06: 60 mL via INTRAVENOUS

## 2018-06-13 ENCOUNTER — Inpatient Hospital Stay: Payer: Medicare Other

## 2018-06-13 ENCOUNTER — Encounter: Payer: Self-pay | Admitting: Internal Medicine

## 2018-06-13 ENCOUNTER — Other Ambulatory Visit: Payer: Self-pay

## 2018-06-13 ENCOUNTER — Inpatient Hospital Stay: Payer: Medicare Other | Attending: Internal Medicine | Admitting: Internal Medicine

## 2018-06-13 VITALS — BP 106/70 | HR 78 | Temp 95.1°F | Resp 22 | Ht 66.0 in | Wt 152.1 lb

## 2018-06-13 DIAGNOSIS — E876 Hypokalemia: Secondary | ICD-10-CM | POA: Diagnosis not present

## 2018-06-13 DIAGNOSIS — I480 Paroxysmal atrial fibrillation: Secondary | ICD-10-CM

## 2018-06-13 DIAGNOSIS — N183 Chronic kidney disease, stage 3 (moderate): Secondary | ICD-10-CM | POA: Insufficient documentation

## 2018-06-13 DIAGNOSIS — J9610 Chronic respiratory failure, unspecified whether with hypoxia or hypercapnia: Secondary | ICD-10-CM | POA: Diagnosis not present

## 2018-06-13 DIAGNOSIS — J449 Chronic obstructive pulmonary disease, unspecified: Secondary | ICD-10-CM | POA: Diagnosis not present

## 2018-06-13 DIAGNOSIS — C3401 Malignant neoplasm of right main bronchus: Secondary | ICD-10-CM | POA: Insufficient documentation

## 2018-06-13 DIAGNOSIS — I509 Heart failure, unspecified: Secondary | ICD-10-CM | POA: Insufficient documentation

## 2018-06-13 DIAGNOSIS — J90 Pleural effusion, not elsewhere classified: Secondary | ICD-10-CM | POA: Insufficient documentation

## 2018-06-13 DIAGNOSIS — I129 Hypertensive chronic kidney disease with stage 1 through stage 4 chronic kidney disease, or unspecified chronic kidney disease: Secondary | ICD-10-CM | POA: Diagnosis not present

## 2018-06-13 DIAGNOSIS — M549 Dorsalgia, unspecified: Secondary | ICD-10-CM | POA: Diagnosis not present

## 2018-06-13 DIAGNOSIS — Z9981 Dependence on supplemental oxygen: Secondary | ICD-10-CM | POA: Diagnosis not present

## 2018-06-13 DIAGNOSIS — Z7901 Long term (current) use of anticoagulants: Secondary | ICD-10-CM | POA: Diagnosis not present

## 2018-06-13 LAB — CBC WITH DIFFERENTIAL/PLATELET
ABS IMMATURE GRANULOCYTES: 0.01 10*3/uL (ref 0.00–0.07)
BASOS PCT: 1 %
Basophils Absolute: 0 10*3/uL (ref 0.0–0.1)
Eosinophils Absolute: 0.2 10*3/uL (ref 0.0–0.5)
Eosinophils Relative: 4 %
HCT: 41.1 % (ref 36.0–46.0)
Hemoglobin: 12.7 g/dL (ref 12.0–15.0)
IMMATURE GRANULOCYTES: 0 %
Lymphocytes Relative: 15 %
Lymphs Abs: 0.6 10*3/uL — ABNORMAL LOW (ref 0.7–4.0)
MCH: 30.1 pg (ref 26.0–34.0)
MCHC: 30.9 g/dL (ref 30.0–36.0)
MCV: 97.4 fL (ref 80.0–100.0)
MONOS PCT: 10 %
Monocytes Absolute: 0.4 10*3/uL (ref 0.1–1.0)
NEUTROS ABS: 2.8 10*3/uL (ref 1.7–7.7)
NEUTROS PCT: 70 %
PLATELETS: 169 10*3/uL (ref 150–400)
RBC: 4.22 MIL/uL (ref 3.87–5.11)
RDW: 15 % (ref 11.5–15.5)
WBC: 4 10*3/uL (ref 4.0–10.5)
nRBC: 0 % (ref 0.0–0.2)

## 2018-06-13 LAB — COMPREHENSIVE METABOLIC PANEL
ALBUMIN: 3.7 g/dL (ref 3.5–5.0)
ALT: 10 U/L (ref 0–44)
ANION GAP: 11 (ref 5–15)
AST: 23 U/L (ref 15–41)
Alkaline Phosphatase: 76 U/L (ref 38–126)
BILIRUBIN TOTAL: 0.8 mg/dL (ref 0.3–1.2)
BUN: 14 mg/dL (ref 8–23)
CO2: 35 mmol/L — ABNORMAL HIGH (ref 22–32)
Calcium: 8.8 mg/dL — ABNORMAL LOW (ref 8.9–10.3)
Chloride: 96 mmol/L — ABNORMAL LOW (ref 98–111)
Creatinine, Ser: 1.33 mg/dL — ABNORMAL HIGH (ref 0.44–1.00)
GFR calc Af Amer: 43 mL/min — ABNORMAL LOW (ref >60)
GFR calc non Af Amer: 37 mL/min — ABNORMAL LOW (ref >60)
GLUCOSE: 95 mg/dL (ref 70–99)
POTASSIUM: 3.2 mmol/L — AB (ref 3.5–5.1)
Sodium: 142 mmol/L (ref 135–145)
TOTAL PROTEIN: 7.7 g/dL (ref 6.5–8.1)

## 2018-06-13 MED ORDER — POTASSIUM CHLORIDE CRYS ER 20 MEQ PO TBCR: 20.0000 meq | EXTENDED_RELEASE_TABLET | Freq: Every day | ORAL | 3 refills | Status: DC

## 2018-06-13 NOTE — Progress Notes (Signed)
[place] OFFICE PROGRESS NOTE  Patient Care Team: Glean Hess, MD as PCP - General (Internal Medicine) Minna Merritts, MD as Consulting Physician (Cardiology) Cammie Sickle, MD as Consulting Physician (Internal Medicine) Alisa Graff, FNP as Nurse Practitioner (Cardiology) Flora Lipps, MD as Consulting Physician (Pulmonary Disease) Marvia Pickles, MD as Consulting Physician (Cardiology)  Cancer Staging No matching staging information was found for the patient.   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 Biopsy from hilar area and lymph node station 4R was positive for squamous cell carcinoma.  Patient had compression of the right mainstem bronchus because of enlarged lymph node and a mass; [ T4 N1 M0 tumor stage IIIa ].  2. Started on radiation and chemotherapy from October 21, 2014 3. Finished 6 cycles of carboplatinum and Taxol  in March  29 th of 2016,  PET scan shows significant response  4. Started on consolidation chemotherapy.  Patient finished 2 cycles on July 2016 of carboplatin and Taxol. 5. Atrial fibrillation diagnosis in August of 2016 on eloquis 6. Repeat bronchoscopy was negative for any malignancy  In February 2017.  # SEP 7th 2017- CT [facility]- 5cm hilar mass; bil Ground glass opacities.   # Radiation Pneumonitis [Dr.Mungal] on Prednisone  # Recurrent plueral effusion-   # May 1st 2018- F one- No targettable mutations; Int-TMB; ? 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  female pleasant patient above history  of stage III squamous cell lung cancer/CHF COPD is here for follow-up/also review the results of the CT scan.  Patient complains of worsening shortness of breath over the last few weeks.  Gradually progressive positive for cough.  Denies any fevers or chills.  She continues to be 2 to 3 L on oxygen.  Chronic back pain.  Denies any swelling.  Review of Systems  Constitutional: Positive for malaise/fatigue. Negative for chills, diaphoresis and fever.  HENT: Negative for nosebleeds and sore throat.   Eyes: Negative for double vision.  Respiratory: Positive for cough and shortness of breath. Negative for hemoptysis, sputum production and wheezing.   Cardiovascular: Negative for chest pain, palpitations, orthopnea and leg swelling.  Gastrointestinal: Negative for abdominal pain, blood in stool, constipation, diarrhea, heartburn, melena, nausea and vomiting.  Genitourinary: Negative for dysuria, frequency and urgency.  Musculoskeletal: Positive for back pain and joint pain.  Skin: Negative.  Negative for itching and rash.  Neurological: Negative for dizziness, tingling, focal weakness, weakness and headaches.  Endo/Heme/Allergies: Does not bruise/bleed easily.  Psychiatric/Behavioral: Negative for depression. The patient is not nervous/anxious and does not have insomnia.       PAST MEDICAL HISTORY :  Past Medical History:  Diagnosis Date  . Acute respiratory failure with hypoxia ([place]) 12/11/2017  . Atypical atrial flutter ([place])    a. s/p ablation 07/27/2013 followed by Dr. Rockey Situ  . CAD (coronary artery disease)    a. s/p MI x 2 in 2002 s/p PCI x 2 in 2002; b. s/p 2v CABG 2002; c. stress echo 07/2004 w/ evi of pos & inf  infarct & no evi of ischemia; d. 4/08 dipyridamole scan w/ multiple areas of infarct, no ischemia, EF 49%; e. cath 04/28/15 3v CAD, med Rx rec, no targets for revasc, LM lum irregs, pLAD 30%, 100%, ost-pLCx 60%, [name] 99%, OM2 100%, p-mRCA 90%, [name] 100% L-R collats, VG-mLAD  irregs, VG-OM2 oc  . Carcinoma of right lung ([place]) 01/03/2015   a. followed by Dr. Oliva Bustard  . Chronic systolic CHF (congestive heart failure) ([place])    a. echo 03/2015: EF 30-35%, sev ant/inf/pos HK, in mild to mod MR  . Complication of anesthesia    more recently patient oxygen levels do not rebound as quickly  . Compressed spine fracture ([place]) 08/06/2016   lumbar 2, t11, t12  . COPD (chronic obstructive pulmonary disease) ([place])   . Fractured pelvis ([place]) 05/26/2016   2 places  . GERD (gastroesophageal reflux disease)   . History of blood clots    12/2001  . History of colonoscopy 2013  . History of mammography, screening 2015  . History of Papanicolaou smear of cervix 2013  . HLD (hyperlipidemia)   . HTN (hypertension)   . Hypothyroidism   . Lung cancer ([place])   . Mitral regurgitation    a. s/p mitral ring placement 09/2000; b. echo 09/2010: EF 50%, inf HK, post AK, mild MR, prosthetic mitral valve ring w/ peak gradient of 10 mmHg; b. echo 2/13: EF 50%, mild MR/TR     . Myocardial infarction ([place])    X 2 (LAST ONE IN 2002)  . Neuropathy   . Pacemaker    a. MDT 2002; b. generator replacement 2013; c. followed by Dr. Omelia Blackwater, MD  . PAF (paroxysmal atrial fibrillation) ([place])    a. on Eliquis   . Personal history of chemotherapy   . Personal history of radiation therapy     PAST SURGICAL HISTORY :   Past Surgical History:  Procedure Laterality Date  . ABLATION  04/2016   [facility]  . APPENDECTOMY    . CARDIAC CATHETERIZATION N/A 04/28/2015   Procedure: Left Heart Cath and Coronary Angiography;  Surgeon: Wellington Hampshire, MD;  Location: [facility];  Service: Cardiovascular;  Laterality: N/A;  . CARDIOVERSION N/A 04/08/2017   Procedure: CARDIOVERSION;  Surgeon: Wellington Hampshire, MD;  Location: [facility];  Service: Cardiovascular;  Laterality: N/A;  . COLONOSCOPY  12/2009   2 small tubular adenomas  . CORONARY ARTERY BYPASS GRAFT  09/2000  . ECTOPIC PREGNANCY SURGERY    .  ELECTROMAGNETIC NAVIGATION BROCHOSCOPY N/A 10/06/2015   Procedure: ELECTROMAGNETIC NAVIGATION BRONCHOSCOPY;  Surgeon: Flora Lipps, MD;  Location: [facility];  Service: Cardiopulmonary;  Laterality: N/A;  . ENDOBRONCHIAL ULTRASOUND N/A 10/06/2015   Procedure: ENDOBRONCHIAL ULTRASOUND;  Surgeon: Flora Lipps, MD;  Location: [facility];  Service: Cardiopulmonary;  Laterality: N/A;  . KYPHOPLASTY N/A 09/28/2016   Procedure: KYPHOPLASTY;  Surgeon: Hessie Knows, MD;  Location: [facility];  Service: Orthopedics;  Laterality: N/A;  . KYPHOPLASTY N/A 02/20/2018   Procedure: [id number];  Surgeon: Hessie Knows, MD;  Location: [facility];  Service: Orthopedics;  Laterality: N/A;  . PACEMAKER INSERTION  03/2012  . thorocentesis  12/24/2016  . VAGINAL HYSTERECTOMY     partial - left ovary remains    FAMILY HISTORY :   Family History  Problem Relation Age of Onset  . COPD Mother        sister, and brother  . Lung disease Father   . Stroke Maternal Grandmother   . Hypertension Sister   .  COPD Sister   . Hypertension Brother   . COPD Brother   . Colon cancer Brother   . Heart attack Neg Hx   . Breast cancer Neg Hx     SOCIAL HISTORY:   Social History   Tobacco Use  . Smoking status: Former Smoker    Packs/day: 1.00    Years: 40.00    Pack years: 40.00    Types: Cigarettes    Last attempt to quit: 08/30/2000    Years since quitting: 17.7  . Smokeless tobacco: Never Used  . Tobacco comment: quit smoking in 08/28/2000. Smoking cessation materials not required  Substance Use Topics  . Alcohol use: Yes    Alcohol/week: 10.0 standard drinks    Types: 10 Glasses of wine per week    Comment: 2 glasses of wine per day  . Drug use: No    ALLERGIES:  is allergic to lovenox [enoxaparin sodium].  MEDICATIONS:  Current Outpatient Medications  Medication Sig Dispense Refill  . acetaminophen (TYLENOL) 500 MG tablet Take 1,000 mg by mouth every 6 (six) hours as needed for mild pain or moderate pain.     Marland Kitchen  amiodarone (PACERONE) 200 MG tablet Take 1 tablet (200 mg total) by mouth daily. 90 tablet 3  . budesonide (PULMICORT) 0.5 MG/2ML nebulizer solution Take 2 mLs (0.5 mg total) by nebulization 2 (two) times daily. 360 mL 5  . Calcium Carb-Cholecalciferol (CALCIUM 600-D PO) Take 1 tablet by mouth every morning.     . Cetirizine HCl 10 MG CAPS Take 1 capsule (10 mg total) by mouth daily. 30 capsule 5  . ELIQUIS 5 MG TABS tablet TAKE (1) TABLET BY MOUTH TWICE DAILY 180 tablet 3  . ENTRESTO 24-26 MG TAKE (1) TABLET BY MOUTH TWICE DAILY 60 tablet 6  . furosemide (LASIX) 40 MG tablet TAKE (1) TABLET BY MOUTH TWICE DAILY (Patient taking differently: TAKE (1) TABLET BY MOUTH TWICE DAILY EXCEPT SUNDAY) 60 tablet 3  . levothyroxine (SYNTHROID, LEVOTHROID) 88 MCG tablet Take 1 tablet (88 mcg total) by mouth daily before breakfast. 30 tablet 5  . metoprolol succinate (TOPROL-XL) 25 MG 24 hr tablet TAKE ONE TABLET BY MOUTH EVERY MORNING AND TAKE TWO TABLETS BY MOUTH EVERY EVENING 90 tablet 2  . Revefenacin (YUPELRI) 175 MCG/3ML SOLN Inhale 3 mLs into the lungs daily. DX: J76.77 90 mL 11  . rosuvastatin (CRESTOR) 5 MG tablet Take 1 tablet (5 mg total) by mouth daily. 90 tablet 3  . senna (SENOKOT) 8.6 MG TABS tablet Take 1 tablet (8.6 mg total) by mouth daily. 30 each 0  . fluticasone furoate-vilanterol (BREO ELLIPTA) 200-25 MCG/INH AEPB Inhale 1 puff into the lungs daily as needed (wheezing or coughing).     . potassium chloride SA (K-DUR,KLOR-CON) 20 MEQ tablet Take 1 tablet (20 mEq total) by mouth daily. 30 tablet 3   No current facility-administered medications for this visit.    Facility-Administered Medications Ordered in Other Visits  Medication Dose Route Frequency Provider Last Rate Last Dose  . sodium chloride 0.9 % injection 10 mL  10 mL Intravenous PRN Forest Gleason, MD   10 mL at 02/19/15 1000  . sodium chloride flush (NS) 0.9 % injection 10 mL  10 mL Intravenous PRN Cammie Sickle, MD   10  mL at 02/16/16 1048    PHYSICAL EXAMINATION: ECOG PERFORMANCE STATUS: 2 - Symptomatic, <50% confined to bed  BP 106/70 (BP Location: Left Arm, Patient Position: Sitting)   Pulse 78  Temp (!) 95.1 F (35.1 C) (Tympanic)   Resp (!) 22   Ht _0  (1.676 m)   Wt 152 lb 1.9 oz (69 kg)   SpO2 91%   BMI 24.55 kg/m   Filed Weights   06/13/18 1320  Weight: 152 lb 1.9 oz (69 kg)    Physical Exam  Constitutional: She is oriented to person, place, and time.  Frail-appearing Caucasian female patient.  She is by herself.  She is walking of her own.  On 2 to 3 L of oxygen.  HENT:  Head: Normocephalic and atraumatic.  Mouth/Throat: Oropharynx is clear and moist. No oropharyngeal exudate.  Eyes: Pupils are equal, round, and reactive to light.  Neck: Normal range of motion. Neck supple.  Cardiovascular: Normal rate and regular rhythm.  Pulmonary/Chest: No respiratory distress. She has no wheezes.  Decreased air entry bilaterally however right more than left.  Abdominal: Soft. Bowel sounds are normal. She exhibits no distension and no mass. There is no tenderness. There is no rebound and no guarding.  Musculoskeletal: Normal range of motion. She exhibits no edema or tenderness.  Neurological: She is alert and oriented to person, place, and time.  Skin: Skin is warm.  Psychiatric: Affect normal.       LABORATORY DATA:  I have reviewed the data as listed    Component Value Date/Time   NA 142 06/13/2018 1311   NA 140 03/01/2018 1432   NA 136 12/24/2014 1457   K 3.2 (L) 06/13/2018 1311   K 3.6 12/24/2014 1457   CL 96 (L) 06/13/2018 1311   CL 98 (L) 12/24/2014 1457   CO2 35 (H) 06/13/2018 1311   CO2 32 12/24/2014 1457   GLUCOSE 95 06/13/2018 1311   GLUCOSE 107 (H) 12/24/2014 1457   BUN 14 06/13/2018 1311   BUN 20 03/01/2018 1432   BUN 17 12/24/2014 1457   CREATININE 1.33 (H) 06/13/2018 1311   CREATININE 1.00 12/24/2014 1457   CALCIUM 8.8 (L) 06/13/2018 1311   CALCIUM 9.5  12/24/2014 1457   PROT 7.7 06/13/2018 1311   PROT 7.4 12/24/2014 1457   ALBUMIN 3.7 06/13/2018 1311   ALBUMIN 3.9 12/24/2014 1457   AST 23 06/13/2018 1311   AST 22 12/24/2014 1457   ALT 10 06/13/2018 1311   ALT 19 12/24/2014 1457   ALKPHOS 76 06/13/2018 1311   ALKPHOS 65 12/24/2014 1457   BILITOT 0.8 06/13/2018 1311   BILITOT 0.4 12/24/2014 1457   GFRNONAA 37 (L) 06/13/2018 1311   GFRNONAA 55 (L) 12/24/2014 1457   GFRAA 43 (L) 06/13/2018 1311   GFRAA >60 12/24/2014 1457    No results found for: SPEP, UPEP  Lab Results  Component Value Date   WBC 4.0 06/13/2018   NEUTROABS 2.8 06/13/2018   HGB 12.7 06/13/2018   HCT 41.1 06/13/2018   MCV 97.4 06/13/2018   PLT 169 06/13/2018      Chemistry      Component Value Date/Time   NA 142 06/13/2018 1311   NA 140 03/01/2018 1432   NA 136 12/24/2014 1457   K 3.2 (L) 06/13/2018 1311   K 3.6 12/24/2014 1457   CL 96 (L) 06/13/2018 1311   CL 98 (L) 12/24/2014 1457   CO2 35 (H) 06/13/2018 1311   CO2 32 12/24/2014 1457   BUN 14 06/13/2018 1311   BUN 20 03/01/2018 1432   BUN 17 12/24/2014 1457   CREATININE 1.33 (H) 06/13/2018 1311   CREATININE 1.00 12/24/2014 1457  Component Value Date/Time   CALCIUM 8.8 (L) 06/13/2018 1311   CALCIUM 9.5 12/24/2014 1457   ALKPHOS 76 06/13/2018 1311   ALKPHOS 65 12/24/2014 1457   AST 23 06/13/2018 1311   AST 22 12/24/2014 1457   ALT 10 06/13/2018 1311   ALT 19 12/24/2014 1457   BILITOT 0.8 06/13/2018 1311   BILITOT 0.4 12/24/2014 1457       RADIOGRAPHIC STUDIES: I have personally reviewed the radiological images as listed and agreed with the findings in the report. No results found.   ASSESSMENT & PLAN:  Cancer of hilus of right lung ([place]) Stage III squamous cell lung cancer s/p chemo-RT- 2016.  June 08, 2018-CT chest shows progressive consolidation right hilar/anterior lung [approximately 9 x 7 cm; previously 7 x 5 cm]; also right-sided worsening pleural effusion.  #CT scan  concerning for progression/worsening; recommend right-sided thoracentesis; will check labs including cytology.  Also recommend PET scan for further evaluation.  ASAP  #Chronic respiratory failure-Home O2 2 L; multifactorial -COPD/CHF/mRight pleural effusion-see discussion above.   # back pain- improving s/p kyphoplasty-stable  # CKD-III creatinine 1.37 stable. sec to diuretics.  Stable  #A. fib on Eliquis stable.  #Hypokalemia potassium 3.2-new; secondary to diuretics recommend K-Dur 20 mEq.  Prescription given.  # follow up in 1 week/ thora/PET ASAP.   Cc; Dr.Kasa/Ram; Dr.Gollan.    Orders Placed This Encounter  Procedures  . NM PET Image Restag (PS) Skull Base To Thigh    Standing Status:   Future    Standing Expiration Date:   06/13/2019    Order Specific Question:   ** REASON FOR EXAM (FREE TEXT)    Answer:   right lung cancer; right sided plueral effusion    Order Specific Question:   If indicated for the ordered procedure, I authorize the administration of a radiopharmaceutical per Radiology protocol    Answer:   Yes    Order Specific Question:   Preferred imaging location?    Answer:   [facility]    Order Specific Question:   Radiology Contrast Protocol - do NOT remove file path    Answer:   [email or web address]  . US THORACENTESIS ASP PLEURAL SPACE W/IMG GUIDE    Standing Status:   Future    Standing Expiration Date:   08/13/2019    Scheduling Instructions:     Right sided plueral effusion    Order Specific Question:   Are labs required for specimen collection?    Answer:   Yes    Order Specific Question:   Lab orders requested (DO NOT place separate lab orders, these will be automatically ordered during procedure specimen collection):    Answer:   Body Fluid Cell Count With Differential    Order Specific Question:   Lab orders requested (DO NOT place separate lab orders, these will be automatically ordered during procedure specimen  collection):    Answer:   Body Fluid Culture    Order Specific Question:   Lab orders requested (DO NOT place separate lab orders, these will be automatically ordered during procedure specimen collection):    Answer:   Cytology - Non Pap    Order Specific Question:   Lab orders requested (DO NOT place separate lab orders, these will be automatically ordered during procedure specimen collection):    Answer:   Lactate Dehydrogenase, Body Fluid    Order Specific Question:   Lab orders requested (DO NOT place separate lab orders, these will be automatically ordered during procedure specimen  collection):    Answer:   Gram Stain    Order Specific Question:   Lab orders requested (DO NOT place separate lab orders, these will be automatically ordered during procedure specimen collection):    Answer:   Ph, Body Fluid    Order Specific Question:   Lab orders requested (DO NOT place separate lab orders, these will be automatically ordered during procedure specimen collection):    Answer:   Protein, Body Fluid    Order Specific Question:   Lab orders requested (DO NOT place separate lab orders, these will be automatically ordered during procedure specimen collection):    Answer:   Triglycerides, Body Fluids    Order Specific Question:   Lab orders requested (DO NOT place separate lab orders, these will be automatically ordered during procedure specimen collection):    Answer:   Glucose, Body Fluid    Order Specific Question:   Reason for Exam (SYMPTOM  OR DIAGNOSIS REQUIRED)    Answer:   recurrent pleural effusion; hx of lung cancer    Order Specific Question:   Preferred imaging location?    Answer:   [facility]   All questions were answered. The patient knows to call the clinic with any problems, questions or concerns.      Cammie Sickle, MD 06/13/2018 9:23 PM

## 2018-06-13 NOTE — Assessment & Plan Note (Addendum)
Stage III squamous cell lung cancer s/p chemo-RT- 2016.  June 08, 2018-CT chest shows progressive consolidation right hilar/anterior lung [approximately 9 x 7 cm; previously 7 x 5 cm]; also right-sided worsening pleural effusion.  #CT scan concerning for progression/worsening; recommend right-sided thoracentesis; will check labs including cytology.  Also recommend PET scan for further evaluation.  ASAP  #Chronic respiratory failure-Home O2 2 L; multifactorial -COPD/CHF/mRight pleural effusion-see discussion above.   # back pain- improving s/p kyphoplasty-stable  # CKD-III creatinine 1.37 stable. sec to diuretics.  Stable  #A. fib on Eliquis stable.  #Hypokalemia potassium 3.2-new; secondary to diuretics recommend K-Dur 20 mEq.  Prescription given.  # follow up in 1 week/ thora/PET ASAP.   Cc; Dr.Kasa/Ram; Dr.Gollan.

## 2018-06-14 ENCOUNTER — Ambulatory Visit
Admission: RE | Admit: 2018-06-14 | Discharge: 2018-06-14 | Disposition: A | Discharge status: Home / Self Care | Payer: Medicare Other | Source: Ambulatory Visit | Attending: Internal Medicine | Admitting: Internal Medicine

## 2018-06-14 ENCOUNTER — Telehealth: Payer: Self-pay | Admitting: *Deleted

## 2018-06-14 ENCOUNTER — Ambulatory Visit
Admission: RE | Admit: 2018-06-14 | Discharge: 2018-06-14 | Disposition: A | Discharge status: Home / Self Care | Payer: Medicare Other | Source: Ambulatory Visit | Attending: Interventional Radiology | Admitting: Interventional Radiology

## 2018-06-14 DIAGNOSIS — J90 Pleural effusion, not elsewhere classified: Secondary | ICD-10-CM | POA: Insufficient documentation

## 2018-06-14 DIAGNOSIS — C3490 Malignant neoplasm of unspecified part of unspecified bronchus or lung: Secondary | ICD-10-CM | POA: Insufficient documentation

## 2018-06-14 DIAGNOSIS — C3401 Malignant neoplasm of right main bronchus: Secondary | ICD-10-CM

## 2018-06-14 LAB — BODY FLUID CELL COUNT WITH DIFFERENTIAL
Eos, Fluid: 0 %
LYMPHS FL: 82 %
MONOCYTE-MACROPHAGE-SEROUS FLUID: 14 %
Neutrophil Count, Fluid: 4 %
Total Nucleated Cell Count, Fluid: 382 cu mm

## 2018-06-14 LAB — PROTEIN, PLEURAL OR PERITONEAL FLUID: Total protein, fluid: 3.9 g/dL

## 2018-06-14 LAB — LACTATE DEHYDROGENASE, PLEURAL OR PERITONEAL FLUID: LD FL: 72 U/L — AB (ref 3–23)

## 2018-06-14 LAB — GLUCOSE, PLEURAL OR PERITONEAL FLUID: Glucose, Fluid: 105 mg/dL

## 2018-06-14 NOTE — Procedures (Signed)
  Procedure: [place] R thoracentesis   EBL:   minimal Complications:  none immediate  See full dictation in [facility].  Dillard Cannon MD Main # [phone] Pager  [phone]

## 2018-06-14 NOTE — Telephone Encounter (Signed)
Contacted patient to confirm her thoracentesis apt. Pt states that she received a msg this morning and is aware of her arrival time today at 2pm.

## 2018-06-15 ENCOUNTER — Other Ambulatory Visit: Payer: Self-pay | Admitting: Internal Medicine

## 2018-06-15 ENCOUNTER — Other Ambulatory Visit: Payer: Self-pay | Admitting: Cardiovascular Disease

## 2018-06-15 DIAGNOSIS — J3089 Other allergic rhinitis: Secondary | ICD-10-CM

## 2018-06-15 LAB — PH, BODY FLUID: PH, BODY FLUID: 7.6

## 2018-06-15 LAB — TRIGLYCERIDES, BODY FLUIDS: TRIGLYCERIDES FL: 17 mg/dL

## 2018-06-17 ENCOUNTER — Other Ambulatory Visit: Payer: Self-pay | Admitting: Cardiovascular Disease

## 2018-06-17 LAB — BODY FLUID CULTURE
Culture: NO GROWTH
Gram Stain: NONE SEEN

## 2018-06-17 LAB — CYTOLOGY - NON PAP

## 2018-06-19 ENCOUNTER — Encounter
Admission: RE | Admit: 2018-06-19 | Discharge: 2018-06-19 | Disposition: A | Discharge status: Home / Self Care | Payer: Medicare Other | Source: Ambulatory Visit | Attending: Internal Medicine | Admitting: Internal Medicine

## 2018-06-19 DIAGNOSIS — J90 Pleural effusion, not elsewhere classified: Secondary | ICD-10-CM

## 2018-06-19 DIAGNOSIS — C3401 Malignant neoplasm of right main bronchus: Secondary | ICD-10-CM

## 2018-06-19 LAB — GLUCOSE, CAPILLARY: Glucose-Capillary: 85 mg/dL (ref 70–99)

## 2018-06-19 MED ORDER — FLUDEOXYGLUCOSE F - 18 (FDG) INJECTION
6.3000 | Freq: Once | INTRAVENOUS | Status: AC | PRN
  Administered 2018-06-19: 8.13 via INTRAVENOUS

## 2018-06-20 ENCOUNTER — Inpatient Hospital Stay (HOSPITAL_BASED_OUTPATIENT_CLINIC_OR_DEPARTMENT_OTHER): Payer: Medicare Other | Admitting: Internal Medicine

## 2018-06-20 ENCOUNTER — Encounter: Payer: Self-pay | Admitting: Internal Medicine

## 2018-06-20 VITALS — BP 99/71 | HR 105 | Temp 97.0°F | Resp 20 | Wt 154.0 lb

## 2018-06-20 DIAGNOSIS — I480 Paroxysmal atrial fibrillation: Secondary | ICD-10-CM

## 2018-06-20 DIAGNOSIS — I129 Hypertensive chronic kidney disease with stage 1 through stage 4 chronic kidney disease, or unspecified chronic kidney disease: Secondary | ICD-10-CM

## 2018-06-20 DIAGNOSIS — M549 Dorsalgia, unspecified: Secondary | ICD-10-CM

## 2018-06-20 DIAGNOSIS — J9610 Chronic respiratory failure, unspecified whether with hypoxia or hypercapnia: Secondary | ICD-10-CM | POA: Diagnosis not present

## 2018-06-20 DIAGNOSIS — I509 Heart failure, unspecified: Secondary | ICD-10-CM

## 2018-06-20 DIAGNOSIS — J449 Chronic obstructive pulmonary disease, unspecified: Secondary | ICD-10-CM | POA: Diagnosis not present

## 2018-06-20 DIAGNOSIS — J90 Pleural effusion, not elsewhere classified: Secondary | ICD-10-CM

## 2018-06-20 DIAGNOSIS — Z9981 Dependence on supplemental oxygen: Secondary | ICD-10-CM

## 2018-06-20 DIAGNOSIS — Z7901 Long term (current) use of anticoagulants: Secondary | ICD-10-CM

## 2018-06-20 DIAGNOSIS — E876 Hypokalemia: Secondary | ICD-10-CM

## 2018-06-20 DIAGNOSIS — N183 Chronic kidney disease, stage 3 (moderate): Secondary | ICD-10-CM

## 2018-06-20 DIAGNOSIS — C3401 Malignant neoplasm of right main bronchus: Secondary | ICD-10-CM | POA: Diagnosis not present

## 2018-06-20 NOTE — Assessment & Plan Note (Addendum)
#   Stage III squamous cell lung cancer s/p chemo-RT- 2016.  June 08, 2018-CT chest shows progressive consolidation right hilar/anterior lung [approximately 9 x 7 cm; previously 7 x 5 cm]; also right-sided worsening pleural effusion.  October 21 PET scan shows-stable consolidation right upper lung/no significant uptake-suggestive of benign radiation etiology; however repeated right-sided effusion noted [see discussion below].   #Clinically no evidence of recurrence based on imaging; cytology x2 negative.  Continue surveillance.  # Recurrent pleural effusion right side-appears transudative discussed with pulmonary; also discussed with Dr. Faith Rogue; recommend Pleurx catheter/pleurodesis; pleural biopsy if possible.  #Chronic respiratory failure-Home O2 2 L; multifactorial -COPD/CHF/mRight pleural effusion-see discussion above.   #Back pain improving s/p kyphoplasty-stable  # CKD-III creatinine 1.37 stable. sec to diuretics.  Stable  # Prosthetic valve/ A. fib on Eliquis-stable  #Hypokalemia-on potassium stable -------------------- ----------------------------------------------------------------- # I reviewed the blood work- with the patient in detail; also reviewed the imaging independently [as summarized above]; and with the patient/ and her son in detail.    Referral to Dr.Oaks re: pleural effusion asap. Follow up in 4 weeks/labs- cbc/bmp/bnp-Dr.B  Cc; Dr.Kasa/Ram; Dr.Gollan.

## 2018-06-20 NOTE — Progress Notes (Signed)
Patient here today for follow up regarding lung cancer, PET results.

## 2018-06-20 NOTE — Progress Notes (Signed)
[facility] OFFICE PROGRESS NOTE  Patient Care Team: Glean Hess, MD as PCP - General (Internal Medicine) Minna Merritts, MD as Consulting Physician (Cardiology) Cammie Sickle, MD as Consulting Physician (Internal Medicine) Alisa Graff, FNP as Nurse Practitioner (Cardiology) Flora Lipps, MD as Consulting Physician (Pulmonary Disease) Marvia Pickles, MD as Consulting Physician (Cardiology)  Cancer Staging No matching staging information was found for the patient.   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 Biopsy from hilar area and lymph node station 4R was positive for squamous cell carcinoma.  Patient had compression of the right mainstem bronchus because of enlarged lymph node and a mass; [ T4 N1 M0 tumor stage IIIa ].  2. Started on radiation and chemotherapy from October 21, 2014 3. Finished 6 cycles of carboplatinum and Taxol  in March  29 th of 2016,  PET scan shows significant response  4. Started on consolidation chemotherapy.  Patient finished 2 cycles on July 2016 of carboplatin and Taxol. 5. Atrial fibrillation diagnosis in August of 2016 on eloquis 6. Repeat bronchoscopy was negative for any malignancy  In February 2017.  # SEP 7th 2017- CT [facility]- 5cm hilar mass; bil Ground glass opacities.   # Radiation Pneumonitis [Dr.Mungal] on Prednisone  # Recurrent plueral effusion-   # May 1st 2018- F one- No targettable mutations; Int-TMB; ? 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  female pleasant patient above history  of stage III squamous cell lung cancer/CHF COPD is here for follow-up/review the results of the PET scan.  In the interim patient had a thoracentesis of the right lung.  Patient continues to have shortness of breath over the last few weeks.  Progressive cough.  Denies any fevers or chills.  She continues to be 2 to 3 L on oxygen.  Chronic back pain.  Denies any swelling.  Review of Systems  Constitutional: Positive for malaise/fatigue. Negative for chills, diaphoresis and fever.  HENT: Negative for nosebleeds and sore throat.   Eyes: Negative for double vision.  Respiratory: Positive for cough and shortness of breath. Negative for hemoptysis, sputum production and wheezing.   Cardiovascular: Negative for chest pain, palpitations, orthopnea and leg swelling.  Gastrointestinal: Negative for abdominal pain, blood in stool, constipation, diarrhea, heartburn, melena, nausea and vomiting.  Genitourinary: Negative for dysuria, frequency and urgency.  Musculoskeletal: Positive for back pain and joint pain.  Skin: Negative.  Negative for itching and rash.  Neurological: Negative for dizziness, tingling, focal weakness, weakness and headaches.  Endo/Heme/Allergies: Does not bruise/bleed easily.  Psychiatric/Behavioral: Negative for depression. The patient is not nervous/anxious and does not have insomnia.       PAST MEDICAL HISTORY :  Past Medical History:  Diagnosis Date  . Acute respiratory failure with hypoxia ([place]) 12/11/2017  . Atypical atrial flutter ([place])    a. s/p ablation 07/27/2013 followed by Dr. Rockey Situ  . CAD (coronary artery disease)    a. s/p MI x 2 in 2002 s/p PCI x 2 in 2002; b. s/p 2v CABG 2002; c. stress  echo 07/2004 w/ evi of pos & inf infarct & no evi of ischemia; d. 4/08 dipyridamole scan w/ multiple areas of infarct, no ischemia, EF 49%; e. cath 04/28/15 3v CAD, med Rx rec, no targets for revasc, LM lum irregs, pLAD 30%, 100%, ost-pLCx 60%, [name] 99%, OM2 100%, p-mRCA 90%,  [name] 100% L-R collats, VG-mLAD irregs, VG-OM2 oc  . Carcinoma of right lung ([place]) 01/03/2015   a. followed by Dr. Oliva Bustard  . Chronic systolic CHF (congestive heart failure) ([place])    a. echo 03/2015: EF 30-35%, sev ant/inf/pos HK, in mild to mod MR  . Complication of anesthesia    more recently patient oxygen levels do not rebound as quickly  . Compressed spine fracture ([place]) 08/06/2016   lumbar 2, t11, t12  . COPD (chronic obstructive pulmonary disease) ([place])   . Fractured pelvis ([place]) 05/26/2016   2 places  . GERD (gastroesophageal reflux disease)   . History of blood clots    12/2001  . History of colonoscopy 2013  . History of mammography, screening 2015  . History of Papanicolaou smear of cervix 2013  . HLD (hyperlipidemia)   . HTN (hypertension)   . Hypothyroidism   . Lung cancer ([place])   . Mitral regurgitation    a. s/p mitral ring placement 09/2000; b. echo 09/2010: EF 50%, inf HK, post AK, mild MR, prosthetic mitral valve ring w/ peak gradient of 10 mmHg; b. echo 2/13: EF 50%, mild MR/TR     . Myocardial infarction ([place])    X 2 (LAST ONE IN 2002)  . Neuropathy   . Pacemaker    a. MDT 2002; b. generator replacement 2013; c. followed by Dr. Omelia Blackwater, MD  . PAF (paroxysmal atrial fibrillation) ([place])    a. on Eliquis   . Personal history of chemotherapy   . Personal history of radiation therapy     PAST SURGICAL HISTORY :   Past Surgical History:  Procedure Laterality Date  . ABLATION  04/2016   [facility]  . APPENDECTOMY    . CARDIAC CATHETERIZATION N/A 04/28/2015   Procedure: Left Heart Cath and Coronary Angiography;  Surgeon: Wellington Hampshire, MD;  Location: [facility];  Service: Cardiovascular;  Laterality: N/A;  . CARDIOVERSION N/A 04/08/2017   Procedure: CARDIOVERSION;  Surgeon: Wellington Hampshire, MD;  Location: [facility];  Service: Cardiovascular;  Laterality: N/A;  . COLONOSCOPY  12/2009   2 small tubular adenomas  . CORONARY ARTERY BYPASS GRAFT  09/2000  . ECTOPIC  PREGNANCY SURGERY    . ELECTROMAGNETIC NAVIGATION BROCHOSCOPY N/A 10/06/2015   Procedure: ELECTROMAGNETIC NAVIGATION BRONCHOSCOPY;  Surgeon: Flora Lipps, MD;  Location: [facility];  Service: Cardiopulmonary;  Laterality: N/A;  . ENDOBRONCHIAL ULTRASOUND N/A 10/06/2015   Procedure: ENDOBRONCHIAL ULTRASOUND;  Surgeon: Flora Lipps, MD;  Location: [facility];  Service: Cardiopulmonary;  Laterality: N/A;  . KYPHOPLASTY N/A 09/28/2016   Procedure: KYPHOPLASTY;  Surgeon: Hessie Knows, MD;  Location: [facility];  Service: Orthopedics;  Laterality: N/A;  . KYPHOPLASTY N/A 02/20/2018   Procedure: [id number];  Surgeon: Hessie Knows, MD;  Location: [facility];  Service: Orthopedics;  Laterality: N/A;  . PACEMAKER INSERTION  03/2012  . thorocentesis  12/24/2016  . VAGINAL HYSTERECTOMY     partial - left ovary remains    FAMILY HISTORY :   Family History  Problem Relation Age of Onset  . COPD Mother        sister, and brother  . Lung disease Father   . Stroke  Maternal Grandmother   . Hypertension Sister   . COPD Sister   . Hypertension Brother   . COPD Brother   . Colon cancer Brother   . Heart attack Neg Hx   . Breast cancer Neg Hx     SOCIAL HISTORY:   Social History   Tobacco Use  . Smoking status: Former Smoker    Packs/day: 1.00    Years: 40.00    Pack years: 40.00    Types: Cigarettes    Last attempt to quit: 08/30/2000    Years since quitting: 17.8  . Smokeless tobacco: Never Used  . Tobacco comment: quit smoking in 08/28/2000. Smoking cessation materials not required  Substance Use Topics  . Alcohol use: Yes    Alcohol/week: 10.0 standard drinks    Types: 10 Glasses of wine per week    Comment: 2 glasses of wine per day  . Drug use: No    ALLERGIES:  is allergic to lovenox [enoxaparin sodium].  MEDICATIONS:  Current Outpatient Medications  Medication Sig Dispense Refill  . acetaminophen (TYLENOL) 500 MG tablet Take 1,000 mg by mouth every 6 (six) hours as needed for mild pain  or moderate pain.     Marland Kitchen amiodarone (PACERONE) 200 MG tablet Take 1 tablet (200 mg total) by mouth daily. 90 tablet 3  . budesonide (PULMICORT) 0.5 MG/2ML nebulizer solution Take 2 mLs (0.5 mg total) by nebulization 2 (two) times daily. 360 mL 5  . Calcium Carb-Cholecalciferol (CALCIUM 600-D PO) Take 1 tablet by mouth every morning.     . cetirizine (ZYRTEC) 10 MG tablet TAKE (1) TABLET BY MOUTH EVERY DAY 30 tablet 12  . ELIQUIS 5 MG TABS tablet TAKE (1) TABLET BY MOUTH TWICE DAILY 180 tablet 3  . ENTRESTO 24-26 MG TAKE (1) TABLET BY MOUTH TWICE DAILY 60 tablet 10  . fluticasone furoate-vilanterol (BREO ELLIPTA) 200-25 MCG/INH AEPB Inhale 1 puff into the lungs daily as needed (wheezing or coughing).     . furosemide (LASIX) 40 MG tablet TAKE (1) TABLET BY MOUTH TWICE DAILY 60 tablet 3  . levothyroxine (SYNTHROID, LEVOTHROID) 88 MCG tablet Take 1 tablet (88 mcg total) by mouth daily before breakfast. 30 tablet 5  . metoprolol succinate (TOPROL-XL) 25 MG 24 hr tablet TAKE ONE TABLET BY MOUTH EVERY MORNING AND TAKE TWO TABLETS BY MOUTH EVERY EVENING 90 tablet 2  . potassium chloride SA (K-DUR,KLOR-CON) 20 MEQ tablet Take 1 tablet (20 mEq total) by mouth daily. 30 tablet 3  . Revefenacin (YUPELRI) 175 MCG/3ML SOLN Inhale 3 mLs into the lungs daily. DX: J76.77 90 mL 11  . rosuvastatin (CRESTOR) 5 MG tablet Take 1 tablet (5 mg total) by mouth daily. 90 tablet 3  . senna (SENOKOT) 8.6 MG TABS tablet Take 1 tablet (8.6 mg total) by mouth daily. 30 each 0   No current facility-administered medications for this visit.    Facility-Administered Medications Ordered in Other Visits  Medication Dose Route Frequency Provider Last Rate Last Dose  . sodium chloride 0.9 % injection 10 mL  10 mL Intravenous PRN Forest Gleason, MD   10 mL at 02/19/15 1000  . sodium chloride flush (NS) 0.9 % injection 10 mL  10 mL Intravenous PRN Charlaine Dalton R, MD   10 mL at 02/16/16 1048    PHYSICAL EXAMINATION: ECOG  PERFORMANCE STATUS: 2 - Symptomatic, <50% confined to bed  BP 99/71   Pulse (!) 105   Temp (!) 97 F (36.1 C) (Tympanic)  Resp 20   Wt 154 lb (69.9 kg)   BMI 24.86 kg/m   Filed Weights   06/20/18 1042  Weight: 154 lb (69.9 kg)    Physical Exam  Constitutional: She is oriented to person, place, and time.  Frail-appearing Caucasian female patient.  She is accompanied by her son.  She is walking of her own.  On 2 to 3 L of oxygen.  HENT:  Head: Normocephalic and atraumatic.  Mouth/Throat: Oropharynx is clear and moist. No oropharyngeal exudate.  Eyes: Pupils are equal, round, and reactive to light.  Neck: Normal range of motion. Neck supple.  Cardiovascular: Normal rate and regular rhythm.  Pulmonary/Chest: No respiratory distress. She has no wheezes.  Decreased air entry bilaterally however right more than left.  Abdominal: Soft. Bowel sounds are normal. She exhibits no distension and no mass. There is no tenderness. There is no rebound and no guarding.  Musculoskeletal: Normal range of motion. She exhibits no edema or tenderness.  Neurological: She is alert and oriented to person, place, and time.  Skin: Skin is warm.  Psychiatric: Affect normal.       LABORATORY DATA:  I have reviewed the data as listed    Component Value Date/Time   NA 142 06/13/2018 1311   NA 140 03/01/2018 1432   NA 136 12/24/2014 1457   K 3.2 (L) 06/13/2018 1311   K 3.6 12/24/2014 1457   CL 96 (L) 06/13/2018 1311   CL 98 (L) 12/24/2014 1457   CO2 35 (H) 06/13/2018 1311   CO2 32 12/24/2014 1457   GLUCOSE 95 06/13/2018 1311   GLUCOSE 107 (H) 12/24/2014 1457   BUN 14 06/13/2018 1311   BUN 20 03/01/2018 1432   BUN 17 12/24/2014 1457   CREATININE 1.33 (H) 06/13/2018 1311   CREATININE 1.00 12/24/2014 1457   CALCIUM 8.8 (L) 06/13/2018 1311   CALCIUM 9.5 12/24/2014 1457   PROT 7.7 06/13/2018 1311   PROT 7.4 12/24/2014 1457   ALBUMIN 3.7 06/13/2018 1311   ALBUMIN 3.9 12/24/2014 1457   AST 23  06/13/2018 1311   AST 22 12/24/2014 1457   ALT 10 06/13/2018 1311   ALT 19 12/24/2014 1457   ALKPHOS 76 06/13/2018 1311   ALKPHOS 65 12/24/2014 1457   BILITOT 0.8 06/13/2018 1311   BILITOT 0.4 12/24/2014 1457   GFRNONAA 37 (L) 06/13/2018 1311   GFRNONAA 55 (L) 12/24/2014 1457   GFRAA 43 (L) 06/13/2018 1311   GFRAA >60 12/24/2014 1457    No results found for: SPEP, UPEP  Lab Results  Component Value Date   WBC 4.0 06/13/2018   NEUTROABS 2.8 06/13/2018   HGB 12.7 06/13/2018   HCT 41.1 06/13/2018   MCV 97.4 06/13/2018   PLT 169 06/13/2018      Chemistry      Component Value Date/Time   NA 142 06/13/2018 1311   NA 140 03/01/2018 1432   NA 136 12/24/2014 1457   K 3.2 (L) 06/13/2018 1311   K 3.6 12/24/2014 1457   CL 96 (L) 06/13/2018 1311   CL 98 (L) 12/24/2014 1457   CO2 35 (H) 06/13/2018 1311   CO2 32 12/24/2014 1457   BUN 14 06/13/2018 1311   BUN 20 03/01/2018 1432   BUN 17 12/24/2014 1457   CREATININE 1.33 (H) 06/13/2018 1311   CREATININE 1.00 12/24/2014 1457      Component Value Date/Time   CALCIUM 8.8 (L) 06/13/2018 1311   CALCIUM 9.5 12/24/2014 1457   ALKPHOS 76  06/13/2018 1311   ALKPHOS 65 12/24/2014 1457   AST 23 06/13/2018 1311   AST 22 12/24/2014 1457   ALT 10 06/13/2018 1311   ALT 19 12/24/2014 1457   BILITOT 0.8 06/13/2018 1311   BILITOT 0.4 12/24/2014 1457       RADIOGRAPHIC STUDIES: I have personally reviewed the radiological images as listed and agreed with the findings in the report. Nm Pet Image Restag (ps) Skull Base To Thigh  Result Date: 06/19/2018 CLINICAL DATA:  Subsequent treatment strategy for RIGHT lung cancer.Stage III squamous cell right lung carcinoma status post chemotherapy and radiation therapy in 2016. restaging. Worsening dyspnea and cough. EXAM: NUCLEAR MEDICINE PET SKULL BASE TO THIGH TECHNIQUE: 8.1 mCi F-18 FDG was injected intravenously. Full-ring PET imaging was performed from the skull base to thigh after the  radiotracer. CT data was obtained and used for attenuation correction and anatomic localization. Fasting blood glucose: 85 mg/dl COMPARISON:  PET-CT 12/30/2016, CT 06/06/2018 FINDINGS: Mediastinal blood pool activity: SUV max 2.6 NECK: No hypermetabolic lymph nodes in the neck. Incidental CT findings: none CHEST: Consolidation about the RIGHT hilum is increased from comparison PET-CT scan of 12/30/2016 however the metabolic activity is similar with SUV max equal 3.2 compared to 3.0. One discrete focus of hypermetabolic activity along the anterior aspect of the consolidation with SUV max equal 4.0 (image 197 fused data set.) No clear lesion evident on the CT portion. There is a large RIGHT pleural effusion similar comparison exam. No discrete pulmonary nodularity. Incidental CT findings: Port in the anterior chest wall with tip in distal SVC. ABDOMEN/PELVIS: No abnormal hypermetabolic activity within the liver, pancreas, adrenal glands, or spleen. No hypermetabolic lymph nodes in the abdomen or pelvis. No focal activity along the hepatic flexure of the colon and within the rectum without clear CT lesions favored benign physiologic. Incidental CT findings: none SKELETON: No focal hypermetabolic activity to suggest skeletal metastasis. Kyphoplasty cement noted in the lumbar spine. Incidental CT findings: none IMPRESSION: 1. Increase consolidation about the RIGHT hilum with mild to moderate metabolic activity is favored benign post therapy change. 2. Large RIGHT effusion unchanged. Electronically Signed   By: Suzy Bouchard M.D.   On: 06/19/2018 12:15     ASSESSMENT & PLAN:  Cancer of hilus of right lung ([place]) # Stage III squamous cell lung cancer s/p chemo-RT- 2016.  June 08, 2018-CT chest shows progressive consolidation right hilar/anterior lung [approximately 9 x 7 cm; previously 7 x 5 cm]; also right-sided worsening pleural effusion.  October 21 PET scan shows-stable consolidation right upper lung/no  significant uptake-suggestive of benign radiation etiology; however repeated right-sided effusion noted [see discussion below].   #Clinically no evidence of recurrence based on imaging; cytology x2 negative.  Continue surveillance.  # Recurrent pleural effusion right side-appears transudative discussed with pulmonary; also discussed with Dr. Faith Rogue; recommend Pleurx catheter/pleurodesis; pleural biopsy if possible.  #Chronic respiratory failure-Home O2 2 L; multifactorial -COPD/CHF/mRight pleural effusion-see discussion above.   #Back pain improving s/p kyphoplasty-stable  # CKD-III creatinine 1.37 stable. sec to diuretics.  Stable  # Prosthetic valve/ A. fib on Eliquis-stable  #Hypokalemia-on potassium stable -------------------- ----------------------------------------------------------------- # I reviewed the blood work- with the patient in detail; also reviewed the imaging independently [as summarized above]; and with the patient/ and her son in detail.    Referral to Dr.Oaks re: pleural effusion asap. Follow up in 4 weeks/labs- cbc/bmp/bnp-Dr.B  Cc; Dr.Kasa/Ram; Dr.Gollan.    Orders Placed This Encounter  Procedures  . Ambulatory referral to  General Surgery    Referral Priority:   Urgent    Referral Type:   Surgical    Referral Reason:   Specialty Services Required    Referred to Provider:   [name], MD    Requested Specialty:   General Surgery    Number of Visits Requested:   1   All questions were answered. The patient knows to call the clinic with any problems, questions or concerns.      Cammie Sickle, MD 06/20/2018 3:06 PM

## 2018-06-26 ENCOUNTER — Encounter: Payer: Self-pay | Admitting: Cardiothoracic Surgery

## 2018-06-26 ENCOUNTER — Other Ambulatory Visit: Payer: Self-pay

## 2018-06-26 ENCOUNTER — Ambulatory Visit (INDEPENDENT_AMBULATORY_CARE_PROVIDER_SITE_OTHER): Payer: Medicare Other | Admitting: Cardiothoracic Surgery

## 2018-06-26 VITALS — BP 159/90 | HR 71 | Temp 97.2°F | Resp 13 | Ht 66.0 in | Wt 154.2 lb

## 2018-06-26 DIAGNOSIS — J90 Pleural effusion, not elsewhere classified: Secondary | ICD-10-CM

## 2018-06-26 NOTE — H&P (View-Only) (Signed)
Patient ID: Jenny Cooper, female   DOB: 07-31-1936, 82 y.o.   MRN: [id number]  Chief Complaint  Patient presents with  . New Patient (Initial Visit)     cancer center referred by Dr. Tish Men fluid on lungs    Referred By Dr. Lynett Fish Reason for Referral recurrent right-sided pleural effusion  HPI Location, Quality, Duration, Severity, Timing, Context, Modifying Factors, Associated Signs and Symptoms.  Jenny Cooper is a 82 y.o. female.  Approximately 3 years ago she was diagnosed with carcinoma of the right lung and underwent radiation therapy and chemotherapy.  She has been in her usual state of health and about a year and a half ago had a right-sided pleural effusion which was treated with ultrasound-guided thoracentesis.  A recurrent pleural effusion occurred about 2 or 3 weeks ago and a subsequent ultrasound-guided thoracentesis was performed again.  There is been no evidence of malignancy.  She had a PET scan done about a week after her thoracentesis and her pleural effusion has again recurred.  I did discuss her care with Dr. Lynett Fish and he has sent her here for my consultation regarding the option of thoracoscopy with pleural biopsy and pleurodesis.  The patient states that she did not receive any significant improvement in her baseline shortness of breath.  She usually wears oxygen and her oxygen saturations are in the low 90s.  Today without oxygen her oxygen saturations were 88% and 91% with oxygen.  She does have an extensive cardiac history as well.  She has had a mitral valve repair about 15 years ago.  She has a history of atrial fibrillation and has a pacemaker in place.  She is currently being treated with amiodarone and Eliquis by Dr. Ida Rogue.  She has seen Dr. Rockey Situ within the last several weeks and he is did not suggest any changes in her medications.  She states that the amiodarone has helped.   Past Medical History:  Diagnosis Date  . Acute respiratory  failure with hypoxia ([place]) 12/11/2017  . Atypical atrial flutter ([place])    a. s/p ablation 07/27/2013 followed by Dr. Rockey Situ  . CAD (coronary artery disease)    a. s/p MI x 2 in 2002 s/p PCI x 2 in 2002; b. s/p 2v CABG 2002; c. stress echo 07/2004 w/ evi of pos & inf infarct & no evi of ischemia; d. 4/08 dipyridamole scan w/ multiple areas of infarct, no ischemia, EF 49%; e. cath 04/28/15 3v CAD, med Rx rec, no targets for revasc, LM lum irregs, pLAD 30%, 100%, ost-pLCx 60%, [name] 99%, OM2 100%, p-mRCA 90%, [name] 100% L-R collats, VG-mLAD irregs, VG-OM2 oc  . Carcinoma of right lung ([place]) 01/03/2015   a. followed by Dr. Oliva Bustard  . Chronic systolic CHF (congestive heart failure) ([place])    a. echo 03/2015: EF 30-35%, sev ant/inf/pos HK, in mild to mod MR  . Complication of anesthesia    more recently patient oxygen levels do not rebound as quickly  . Compressed spine fracture ([place]) 08/06/2016   lumbar 2, t11, t12  . COPD (chronic obstructive pulmonary disease) ([place])   . Fractured pelvis ([place]) 05/26/2016   2 places  . GERD (gastroesophageal reflux disease)   . History of blood clots    12/2001  . History of colonoscopy 2013  . History of mammography, screening 2015  . History of Papanicolaou smear of cervix 2013  . HLD (hyperlipidemia)   . HTN (hypertension)   . Hypothyroidism   . Lung  cancer ([place])   . Mitral regurgitation    a. s/p mitral ring placement 09/2000; b. echo 09/2010: EF 50%, inf HK, post AK, mild MR, prosthetic mitral valve ring w/ peak gradient of 10 mmHg; b. echo 2/13: EF 50%, mild MR/TR     . Myocardial infarction ([place])    X 2 (LAST ONE IN 2002)  . Neuropathy   . Pacemaker    a. MDT 2002; b. generator replacement 2013; c. followed by Dr. Omelia Blackwater, MD  . PAF (paroxysmal atrial fibrillation) ([place])    a. on Eliquis   . Personal history of chemotherapy   . Personal history of radiation therapy     Past Surgical History:  Procedure Laterality Date  . ABLATION  04/2016   [facility]  .  APPENDECTOMY    . CARDIAC CATHETERIZATION N/A 04/28/2015   Procedure: Left Heart Cath and Coronary Angiography;  Surgeon: Wellington Hampshire, MD;  Location: [facility];  Service: Cardiovascular;  Laterality: N/A;  . CARDIOVERSION N/A 04/08/2017   Procedure: CARDIOVERSION;  Surgeon: Wellington Hampshire, MD;  Location: [facility];  Service: Cardiovascular;  Laterality: N/A;  . COLONOSCOPY  12/2009   2 small tubular adenomas  . CORONARY ARTERY BYPASS GRAFT  09/2000  . ECTOPIC PREGNANCY SURGERY    . ELECTROMAGNETIC NAVIGATION BROCHOSCOPY N/A 10/06/2015   Procedure: ELECTROMAGNETIC NAVIGATION BRONCHOSCOPY;  Surgeon: Flora Lipps, MD;  Location: [facility];  Service: Cardiopulmonary;  Laterality: N/A;  . ENDOBRONCHIAL ULTRASOUND N/A 10/06/2015   Procedure: ENDOBRONCHIAL ULTRASOUND;  Surgeon: Flora Lipps, MD;  Location: [facility];  Service: Cardiopulmonary;  Laterality: N/A;  . KYPHOPLASTY N/A 09/28/2016   Procedure: KYPHOPLASTY;  Surgeon: Hessie Knows, MD;  Location: [facility];  Service: Orthopedics;  Laterality: N/A;  . KYPHOPLASTY N/A 02/20/2018   Procedure: [id number];  Surgeon: Hessie Knows, MD;  Location: [facility];  Service: Orthopedics;  Laterality: N/A;  . PACEMAKER INSERTION  03/2012  . thorocentesis  12/24/2016  . VAGINAL HYSTERECTOMY     partial - left ovary remains    Family History  Problem Relation Age of Onset  . COPD Mother        sister, and brother  . Lung disease Father   . Stroke Maternal Grandmother   . Hypertension Sister   . COPD Sister   . Hypertension Brother   . COPD Brother   . Colon cancer Brother   . Heart attack Neg Hx   . Breast cancer Neg Hx     Social History Social History   Tobacco Use  . Smoking status: Former Smoker    Packs/day: 1.00    Years: 40.00    Pack years: 40.00    Types: Cigarettes    Last attempt to quit: 08/30/2000    Years since quitting: 17.8  . Smokeless tobacco: Never Used  . Tobacco comment: quit smoking in 08/28/2000. Smoking  cessation materials not required  Substance Use Topics  . Alcohol use: Yes    Alcohol/week: 10.0 standard drinks    Types: 10 Glasses of wine per week    Comment: 2 glasses of wine per day  . Drug use: No    Allergies  Allergen Reactions  . Lovenox [Enoxaparin Sodium] Itching    Current Outpatient Medications  Medication Sig Dispense Refill  . acetaminophen (TYLENOL) 500 MG tablet Take 1,000 mg by mouth every 6 (six) hours as needed for mild pain or moderate pain.     Marland Kitchen amiodarone (PACERONE) 200 MG tablet Take 1 tablet (200  mg total) by mouth daily. 90 tablet 3  . budesonide (PULMICORT) 0.5 MG/2ML nebulizer solution Take 2 mLs (0.5 mg total) by nebulization 2 (two) times daily. 360 mL 5  . Calcium Carb-Cholecalciferol (CALCIUM 600-D PO) Take 1 tablet by mouth every morning.     . cetirizine (ZYRTEC) 10 MG tablet TAKE (1) TABLET BY MOUTH EVERY DAY 30 tablet 12  . ELIQUIS 5 MG TABS tablet TAKE (1) TABLET BY MOUTH TWICE DAILY 180 tablet 3  . ENTRESTO 24-26 MG TAKE (1) TABLET BY MOUTH TWICE DAILY 60 tablet 10  . fluticasone furoate-vilanterol (BREO ELLIPTA) 200-25 MCG/INH AEPB Inhale 1 puff into the lungs daily as needed (wheezing or coughing).     . furosemide (LASIX) 40 MG tablet TAKE (1) TABLET BY MOUTH TWICE DAILY 60 tablet 3  . levothyroxine (SYNTHROID, LEVOTHROID) 88 MCG tablet Take 1 tablet (88 mcg total) by mouth daily before breakfast. 30 tablet 5  . metoprolol succinate (TOPROL-XL) 25 MG 24 hr tablet TAKE ONE TABLET BY MOUTH EVERY MORNING AND TAKE TWO TABLETS BY MOUTH EVERY EVENING 90 tablet 2  . potassium chloride SA (K-DUR,KLOR-CON) 20 MEQ tablet Take 1 tablet (20 mEq total) by mouth daily. 30 tablet 3  . Revefenacin (YUPELRI) 175 MCG/3ML SOLN Inhale 3 mLs into the lungs daily. DX: J76.77 90 mL 11  . rosuvastatin (CRESTOR) 5 MG tablet Take 1 tablet (5 mg total) by mouth daily. 90 tablet 3  . senna (SENOKOT) 8.6 MG TABS tablet Take 1 tablet (8.6 mg total) by mouth daily. 30 each  0   No current facility-administered medications for this visit.    Facility-Administered Medications Ordered in Other Visits  Medication Dose Route Frequency Provider Last Rate Last Dose  . sodium chloride 0.9 % injection 10 mL  10 mL Intravenous PRN Forest Gleason, MD   10 mL at 02/19/15 1000  . sodium chloride flush (NS) 0.9 % injection 10 mL  10 mL Intravenous PRN Cammie Sickle, MD   10 mL at 02/16/16 1048      Review of Systems A complete review of systems was asked and was negative except for the following positive findings shortness of breath, weight loss.  Blood pressure (!) 159/90, pulse 71, temperature (!) 97.2 F (36.2 C), temperature source Temporal, resp. rate 13, height 5\' 6"  (1.676 m), weight 154 lb 3.2 oz (69.9 kg), SpO2 91 %.  Physical Exam CONSTITUTIONAL:  Pleasant, well-developed, well-nourished, and in no acute distress. EYES: Pupils equal and reactive to light, Sclera non-icteric EARS, NOSE, MOUTH AND THROAT:  The oropharynx was clear.  Dentition is absent with denture repair.  Oral mucosa pink and moist. LYMPH NODES:  Lymph nodes in the neck and axillae were normal RESPIRATORY:  Lungs were clear on the left with slightly diminished breath sounds at the right base.  Normal respiratory effort without pathologic use of accessory muscles of respiration CARDIOVASCULAR: Heart was regular without murmurs.  There were no carotid bruits. GI: The abdomen was soft, nontender, and nondistended. There were no palpable masses. There was no hepatosplenomegaly. There were normal bowel sounds in all quadrants. GU:  Rectal deferred.   MUSCULOSKELETAL:  Normal muscle strength and tone.  No clubbing or cyanosis.   SKIN:  There were no pathologic skin lesions.  There were no nodules on palpation. NEUROLOGIC:  Sensation is normal.  Cranial nerves are grossly intact. PSYCH:  Oriented to person, place and time.  Mood and affect are normal.  Data Reviewed Multiple CT scans and  PET scans.  I have personally reviewed the patient's imaging, laboratory findings and medical records.    Assessment    Recurrent right-sided pleural effusion    Plan    I had a long discussion today with Mrs. Jenny Cooper and her daughter.  I reviewed with him the options of thoracoscopy with pleural biopsy and talc pleurodesis with or without Pleurx catheter insertion.  I reviewed with them the indications and risks including risks of bleeding, infection, air leak and death.  We also discussed the role that the Pleurx catheter may play in management of her recurrent pleural effusion.  At the present time the patient would like to think about her options.  I did explain to her that we would need Dr. Rockey Situ to stop her anticoagulation prior to surgery.  She understands that there is a risk with this as well.  I did give the patient's daughter my business card and told him to contact me if they have any further questions.  We also provided them with the Pleurx catheter insertion died as well as the video.  They will watch this and be back in touch if they wish to proceed.  All their questions were answered.    [name], MD 06/26/2018, 10:20 AM

## 2018-06-26 NOTE — Patient Instructions (Signed)
Patient will call Dr.Oaks with any further questions or concerns for surgery. She has been informed to follow up as needed.

## 2018-06-26 NOTE — Progress Notes (Signed)
Patient ID: Jenny Cooper, female   DOB: Dec 26, 1935, 82 y.o.   MRN: [id number]  Chief Complaint  Patient presents with  . New Patient (Initial Visit)     cancer center referred by Dr. Tish Men fluid on lungs    Referred By Dr. Lynett Fish Reason for Referral recurrent right-sided pleural effusion  HPI Location, Quality, Duration, Severity, Timing, Context, Modifying Factors, Associated Signs and Symptoms.  Jenny Cooper is a 82 y.o. female.  Approximately 3 years ago she was diagnosed with carcinoma of the right lung and underwent radiation therapy and chemotherapy.  She has been in her usual state of health and about a year and a half ago had a right-sided pleural effusion which was treated with ultrasound-guided thoracentesis.  A recurrent pleural effusion occurred about 2 or 3 weeks ago and a subsequent ultrasound-guided thoracentesis was performed again.  There is been no evidence of malignancy.  She had a PET scan done about a week after her thoracentesis and her pleural effusion has again recurred.  I did discuss her care with Dr. Lynett Fish and he has sent her here for my consultation regarding the option of thoracoscopy with pleural biopsy and pleurodesis.  The patient states that she did not receive any significant improvement in her baseline shortness of breath.  She usually wears oxygen and her oxygen saturations are in the low 90s.  Today without oxygen her oxygen saturations were 88% and 91% with oxygen.  She does have an extensive cardiac history as well.  She has had a mitral valve repair about 15 years ago.  She has a history of atrial fibrillation and has a pacemaker in place.  She is currently being treated with amiodarone and Eliquis by Dr. Ida Rogue.  She has seen Dr. Rockey Situ within the last several weeks and he is did not suggest any changes in her medications.  She states that the amiodarone has helped.   Past Medical History:  Diagnosis Date  . Acute respiratory  failure with hypoxia ([place]) 12/11/2017  . Atypical atrial flutter ([place])    a. s/p ablation 07/27/2013 followed by Dr. Rockey Situ  . CAD (coronary artery disease)    a. s/p MI x 2 in 2002 s/p PCI x 2 in 2002; b. s/p 2v CABG 2002; c. stress echo 07/2004 w/ evi of pos & inf infarct & no evi of ischemia; d. 4/08 dipyridamole scan w/ multiple areas of infarct, no ischemia, EF 49%; e. cath 04/28/15 3v CAD, med Rx rec, no targets for revasc, LM lum irregs, pLAD 30%, 100%, ost-pLCx 60%, [name] 99%, OM2 100%, p-mRCA 90%, [name] 100% L-R collats, VG-mLAD irregs, VG-OM2 oc  . Carcinoma of right lung ([place]) 01/03/2015   a. followed by Dr. Oliva Bustard  . Chronic systolic CHF (congestive heart failure) ([place])    a. echo 03/2015: EF 30-35%, sev ant/inf/pos HK, in mild to mod MR  . Complication of anesthesia    more recently patient oxygen levels do not rebound as quickly  . Compressed spine fracture ([place]) 08/06/2016   lumbar 2, t11, t12  . COPD (chronic obstructive pulmonary disease) ([place])   . Fractured pelvis ([place]) 05/26/2016   2 places  . GERD (gastroesophageal reflux disease)   . History of blood clots    12/2001  . History of colonoscopy 2013  . History of mammography, screening 2015  . History of Papanicolaou smear of cervix 2013  . HLD (hyperlipidemia)   . HTN (hypertension)   . Hypothyroidism   . Lung  cancer ([place])   . Mitral regurgitation    a. s/p mitral ring placement 09/2000; b. echo 09/2010: EF 50%, inf HK, post AK, mild MR, prosthetic mitral valve ring w/ peak gradient of 10 mmHg; b. echo 2/13: EF 50%, mild MR/TR     . Myocardial infarction ([place])    X 2 (LAST ONE IN 2002)  . Neuropathy   . Pacemaker    a. MDT 2002; b. generator replacement 2013; c. followed by Dr. Omelia Blackwater, MD  . PAF (paroxysmal atrial fibrillation) ([place])    a. on Eliquis   . Personal history of chemotherapy   . Personal history of radiation therapy     Past Surgical History:  Procedure Laterality Date  . ABLATION  04/2016   [facility]  .  APPENDECTOMY    . CARDIAC CATHETERIZATION N/A 04/28/2015   Procedure: Left Heart Cath and Coronary Angiography;  Surgeon: Wellington Hampshire, MD;  Location: [facility];  Service: Cardiovascular;  Laterality: N/A;  . CARDIOVERSION N/A 04/08/2017   Procedure: CARDIOVERSION;  Surgeon: Wellington Hampshire, MD;  Location: [facility];  Service: Cardiovascular;  Laterality: N/A;  . COLONOSCOPY  12/2009   2 small tubular adenomas  . CORONARY ARTERY BYPASS GRAFT  09/2000  . ECTOPIC PREGNANCY SURGERY    . ELECTROMAGNETIC NAVIGATION BROCHOSCOPY N/A 10/06/2015   Procedure: ELECTROMAGNETIC NAVIGATION BRONCHOSCOPY;  Surgeon: Flora Lipps, MD;  Location: [facility];  Service: Cardiopulmonary;  Laterality: N/A;  . ENDOBRONCHIAL ULTRASOUND N/A 10/06/2015   Procedure: ENDOBRONCHIAL ULTRASOUND;  Surgeon: Flora Lipps, MD;  Location: [facility];  Service: Cardiopulmonary;  Laterality: N/A;  . KYPHOPLASTY N/A 09/28/2016   Procedure: KYPHOPLASTY;  Surgeon: Hessie Knows, MD;  Location: [facility];  Service: Orthopedics;  Laterality: N/A;  . KYPHOPLASTY N/A 02/20/2018   Procedure: [id number];  Surgeon: Hessie Knows, MD;  Location: [facility];  Service: Orthopedics;  Laterality: N/A;  . PACEMAKER INSERTION  03/2012  . thorocentesis  12/24/2016  . VAGINAL HYSTERECTOMY     partial - left ovary remains    Family History  Problem Relation Age of Onset  . COPD Mother        sister, and brother  . Lung disease Father   . Stroke Maternal Grandmother   . Hypertension Sister   . COPD Sister   . Hypertension Brother   . COPD Brother   . Colon cancer Brother   . Heart attack Neg Hx   . Breast cancer Neg Hx     Social History Social History   Tobacco Use  . Smoking status: Former Smoker    Packs/day: 1.00    Years: 40.00    Pack years: 40.00    Types: Cigarettes    Last attempt to quit: 08/30/2000    Years since quitting: 17.8  . Smokeless tobacco: Never Used  . Tobacco comment: quit smoking in 08/28/2000. Smoking  cessation materials not required  Substance Use Topics  . Alcohol use: Yes    Alcohol/week: 10.0 standard drinks    Types: 10 Glasses of wine per week    Comment: 2 glasses of wine per day  . Drug use: No    Allergies  Allergen Reactions  . Lovenox [Enoxaparin Sodium] Itching    Current Outpatient Medications  Medication Sig Dispense Refill  . acetaminophen (TYLENOL) 500 MG tablet Take 1,000 mg by mouth every 6 (six) hours as needed for mild pain or moderate pain.     Marland Kitchen amiodarone (PACERONE) 200 MG tablet Take 1 tablet (200  mg total) by mouth daily. 90 tablet 3  . budesonide (PULMICORT) 0.5 MG/2ML nebulizer solution Take 2 mLs (0.5 mg total) by nebulization 2 (two) times daily. 360 mL 5  . Calcium Carb-Cholecalciferol (CALCIUM 600-D PO) Take 1 tablet by mouth every morning.     . cetirizine (ZYRTEC) 10 MG tablet TAKE (1) TABLET BY MOUTH EVERY DAY 30 tablet 12  . ELIQUIS 5 MG TABS tablet TAKE (1) TABLET BY MOUTH TWICE DAILY 180 tablet 3  . ENTRESTO 24-26 MG TAKE (1) TABLET BY MOUTH TWICE DAILY 60 tablet 10  . fluticasone furoate-vilanterol (BREO ELLIPTA) 200-25 MCG/INH AEPB Inhale 1 puff into the lungs daily as needed (wheezing or coughing).     . furosemide (LASIX) 40 MG tablet TAKE (1) TABLET BY MOUTH TWICE DAILY 60 tablet 3  . levothyroxine (SYNTHROID, LEVOTHROID) 88 MCG tablet Take 1 tablet (88 mcg total) by mouth daily before breakfast. 30 tablet 5  . metoprolol succinate (TOPROL-XL) 25 MG 24 hr tablet TAKE ONE TABLET BY MOUTH EVERY MORNING AND TAKE TWO TABLETS BY MOUTH EVERY EVENING 90 tablet 2  . potassium chloride SA (K-DUR,KLOR-CON) 20 MEQ tablet Take 1 tablet (20 mEq total) by mouth daily. 30 tablet 3  . Revefenacin (YUPELRI) 175 MCG/3ML SOLN Inhale 3 mLs into the lungs daily. DX: J76.77 90 mL 11  . rosuvastatin (CRESTOR) 5 MG tablet Take 1 tablet (5 mg total) by mouth daily. 90 tablet 3  . senna (SENOKOT) 8.6 MG TABS tablet Take 1 tablet (8.6 mg total) by mouth daily. 30 each  0   No current facility-administered medications for this visit.    Facility-Administered Medications Ordered in Other Visits  Medication Dose Route Frequency Provider Last Rate Last Dose  . sodium chloride 0.9 % injection 10 mL  10 mL Intravenous PRN Forest Gleason, MD   10 mL at 02/19/15 1000  . sodium chloride flush (NS) 0.9 % injection 10 mL  10 mL Intravenous PRN Cammie Sickle, MD   10 mL at 02/16/16 1048      Review of Systems A complete review of systems was asked and was negative except for the following positive findings shortness of breath, weight loss.  Blood pressure (!) 159/90, pulse 71, temperature (!) 97.2 F (36.2 C), temperature source Temporal, resp. rate 13, height 5\' 6"  (1.676 m), weight 154 lb 3.2 oz (69.9 kg), SpO2 91 %.  Physical Exam CONSTITUTIONAL:  Pleasant, well-developed, well-nourished, and in no acute distress. EYES: Pupils equal and reactive to light, Sclera non-icteric EARS, NOSE, MOUTH AND THROAT:  The oropharynx was clear.  Dentition is absent with denture repair.  Oral mucosa pink and moist. LYMPH NODES:  Lymph nodes in the neck and axillae were normal RESPIRATORY:  Lungs were clear on the left with slightly diminished breath sounds at the right base.  Normal respiratory effort without pathologic use of accessory muscles of respiration CARDIOVASCULAR: Heart was regular without murmurs.  There were no carotid bruits. GI: The abdomen was soft, nontender, and nondistended. There were no palpable masses. There was no hepatosplenomegaly. There were normal bowel sounds in all quadrants. GU:  Rectal deferred.   MUSCULOSKELETAL:  Normal muscle strength and tone.  No clubbing or cyanosis.   SKIN:  There were no pathologic skin lesions.  There were no nodules on palpation. NEUROLOGIC:  Sensation is normal.  Cranial nerves are grossly intact. PSYCH:  Oriented to person, place and time.  Mood and affect are normal.  Data Reviewed Multiple CT scans and  PET scans.  I have personally reviewed the patient's imaging, laboratory findings and medical records.    Assessment    Recurrent right-sided pleural effusion    Plan    I had a long discussion today with Mrs. Martie Round and her daughter.  I reviewed with him the options of thoracoscopy with pleural biopsy and talc pleurodesis with or without Pleurx catheter insertion.  I reviewed with them the indications and risks including risks of bleeding, infection, air leak and death.  We also discussed the role that the Pleurx catheter may play in management of her recurrent pleural effusion.  At the present time the patient would like to think about her options.  I did explain to her that we would need Dr. Rockey Situ to stop her anticoagulation prior to surgery.  She understands that there is a risk with this as well.  I did give the patient's daughter my business card and told him to contact me if they have any further questions.  We also provided them with the Pleurx catheter insertion died as well as the video.  They will watch this and be back in touch if they wish to proceed.  All their questions were answered.    [name], MD 06/26/2018, 10:20 AM

## 2018-06-28 ENCOUNTER — Telehealth: Payer: Self-pay

## 2018-06-28 NOTE — Telephone Encounter (Signed)
Patient called wanting Dr. Genevive Bi to know that she wants to move forward with surgery. However, she stated that she only wanted the thoracoscopy with pleural biopsy and talc pleurodesis without Pleurx catheter insertion, unless needed to. I told her that I would have to contact Dr. Genevive Bi just to verify if this is possible to do and when. I also told her that since she wants to move forward with surgery, that I would request anticoagulant clearance from Dr. Rockey Situ. Patient understood and knows that I would call her back with an answer.  I contacted Dr. Genevive Bi. But he was in surgery until almost 5:00 PM. Therefore, I told him that I would call him back tomorrow morning to let him know.   Patient was also notified that I would have to call her back tomorrow.

## 2018-06-29 ENCOUNTER — Encounter: Payer: Self-pay | Admitting: *Deleted

## 2018-06-29 ENCOUNTER — Other Ambulatory Visit: Payer: Self-pay | Admitting: Cardiothoracic Surgery

## 2018-06-29 DIAGNOSIS — J90 Pleural effusion, not elsewhere classified: Secondary | ICD-10-CM

## 2018-06-29 NOTE — Telephone Encounter (Signed)
Called patient back to let her know that I was able to speak with Dr. Genevive Bi and he told me that he would speak with our surgery scheduler to schedule her surgery.  Patient was told that our surgery scheduler would call her back with her surgery information and what to do with her Eliquis. Patient agreed and had no further questions.

## 2018-06-29 NOTE — Progress Notes (Signed)
Patient's surgery has been scheduled for 07-11-18 at [facility] with Dr. Genevive Bi.  The patient is aware of pre-admission appointment date and time.   We will await clearance request from Dr. Donivan Scull office regarding Eliquis. Dr. Genevive Bi wanted him to see the patient prior to surgery.   The patient is aware to call the office should they have further questions.

## 2018-06-30 ENCOUNTER — Telehealth: Payer: Self-pay | Admitting: Cardiovascular Disease

## 2018-06-30 NOTE — Telephone Encounter (Signed)
° °  [facility] Pre-operative Risk Assessment    Request for surgical clearance:  1. What type of surgery is being performed? Right Thoracoscopy    2. When is this surgery scheduled? Week of 07/10/18   3. What type of clearance is required (medical clearance vs. Pharmacy clearance to hold med vs. Both)? Both   4. Are there any medications that need to be held prior to surgery and how long? Eliquis   5. Practice name and name of physician performing surgery? Dr Genevive Bi  6. What is your office phone number [phone]   7.   What is your office fax number [phone]   8.   Anesthesia type (None, local, MAC, general) ?

## 2018-07-03 NOTE — Telephone Encounter (Signed)
As far as I know our preop clinic does not cover [facility] patients. This should be done in house at [place].   Let me know if you know otherwise.   Thanks, Pecolia Ades, NP

## 2018-07-04 ENCOUNTER — Encounter: Payer: Self-pay | Admitting: Internal Medicine

## 2018-07-04 ENCOUNTER — Ambulatory Visit (INDEPENDENT_AMBULATORY_CARE_PROVIDER_SITE_OTHER): Payer: Medicare Other | Admitting: Internal Medicine

## 2018-07-04 VITALS — BP 116/60 | HR 106 | Temp 97.5°F | Ht 66.0 in | Wt 151.0 lb

## 2018-07-04 DIAGNOSIS — I25118 Atherosclerotic heart disease of native coronary artery with other forms of angina pectoris: Secondary | ICD-10-CM | POA: Diagnosis not present

## 2018-07-04 DIAGNOSIS — Z23 Encounter for immunization: Secondary | ICD-10-CM | POA: Diagnosis not present

## 2018-07-04 DIAGNOSIS — N3000 Acute cystitis without hematuria: Secondary | ICD-10-CM | POA: Diagnosis not present

## 2018-07-04 LAB — POC URINALYSIS WITH MICROSCOPIC (NON AUTO)MANUAL RESULT
Bilirubin, UA: NEGATIVE
Crystals: 0
EPITHELIAL CELLS, URINE PER MICROSCOPY: 5
Glucose, UA: NEGATIVE
KETONES UA: NEGATIVE
Leukocytes, UA: NEGATIVE
MUCUS UA: 0
NITRITE UA: NEGATIVE
Protein, UA: NEGATIVE
RBC: 1 M/uL — AB (ref 4.04–5.48)
SPEC GRAV UA: 1.015 (ref 1.010–1.025)
UROBILINOGEN UA: 0.2 U/dL
WBC CASTS UA: 2
pH, UA: 6 (ref 5.0–8.0)

## 2018-07-04 MED ORDER — CEPHALEXIN 250 MG PO CAPS: 250.0000 mg | ORAL_CAPSULE | Freq: Four times a day (QID) | ORAL | 0 refills | Status: AC

## 2018-07-04 NOTE — Telephone Encounter (Signed)
Acceptable risk for procedure Hold anticoagulation /Eliquis 2 days prior to surgery

## 2018-07-04 NOTE — Progress Notes (Signed)
Date:  07/04/2018   Name:  Jenny Cooper   DOB:  [birth year]   MRN:  [id number]   Chief Complaint: Dysuria (Painful, burning when urination. Urinary frequency. Strong odor. Started in the last 5 days. Having surgery on the 12th of this month and wanted to be sure not having infection. Came with husband today. )  Urinary Tract Infection   This is a new (odor and mild discomfort) problem. The problem has been unchanged. The patient is experiencing no pain. There has been no fever. Associated symptoms include frequency. Pertinent negatives include no hematuria or urgency.  She is have pleurodesis next week and does not want to have a UTI.  She also wants a flu vaccine.  Review of Systems  Constitutional: Positive for fatigue. Negative for fever.  Respiratory: Positive for shortness of breath. Negative for choking and wheezing.   Cardiovascular: Negative for chest pain, palpitations and leg swelling.  Gastrointestinal: Negative for abdominal pain.  Genitourinary: Positive for difficulty urinating and frequency. Negative for hematuria and urgency.  Neurological: Negative for dizziness and headaches.    Patient Active Problem List   Diagnosis Date Noted  . Environmental and seasonal allergies 12/20/2017  . Muscle spasms of neck 12/20/2017  . Muscle ache 09/15/2017  . Coronary artery disease of bypass graft of native heart with stable angina pectoris ([place]) 04/21/2017  . Chronic systolic heart failure ([place]) 04/14/2017  . Sinus node dysfunction ([place]) 04/06/2017  . Back pain of thoracolumbar region 03/15/2017  . PAF (paroxysmal atrial fibrillation) ([place]) 03/05/2017  . Falls frequently 03/05/2017  . Recurrent right pleural effusion 12/23/2016  . Cancer of hilus of right lung ([place]) 12/14/2016  . Compression fracture of L2 lumbar vertebra ([place]) 08/31/2016  . Acute midline low back pain without sciatica 08/13/2016  . Fracture of multiple pubic rami, right, closed, initial encounter ([place])  05/30/2016  . Cardiomyopathy ([place]) 04/29/2016  . Type 2 diabetes mellitus with hemoglobin A1c goal of less than 7.0% ([place]) 11/17/2015  . Hx of adenomatous colonic polyps 11/17/2015  . Radiation pneumonitis ([place]) 10/21/2015  . Mitral regurgitation   . HLD (hyperlipidemia)   . Paroxysmal supraventricular tachycardia ([place])   . NSTEMI (non-ST elevated myocardial infarction) ([place])   . Arteriosclerosis of coronary artery 01/11/2015  . Hypothyroidism (acquired) 01/11/2015  . Disorder of peripheral nervous system 10/04/2014  . Cervical radiculopathy, chronic 10/04/2014  . Lung cancer ([place]) 10/04/2014  . Atrial flutter ([place]) 11/16/2013  . Chronic obstructive pulmonary disease ([place]) 04/24/2012  . Hypertension 08/03/2011  . Pacemaker 08/03/2011    Allergies  Allergen Reactions  . Lovenox [Enoxaparin Sodium] Itching    Past Surgical History:  Procedure Laterality Date  . ABLATION  04/2016   [facility]  . APPENDECTOMY    . CARDIAC CATHETERIZATION N/A 04/28/2015   Procedure: Left Heart Cath and Coronary Angiography;  Surgeon: Wellington Hampshire, MD;  Location: [facility];  Service: Cardiovascular;  Laterality: N/A;  . CARDIOVERSION N/A 04/08/2017   Procedure: CARDIOVERSION;  Surgeon: Wellington Hampshire, MD;  Location: [facility];  Service: Cardiovascular;  Laterality: N/A;  . COLONOSCOPY  12/2009   2 small tubular adenomas  . CORONARY ARTERY BYPASS GRAFT  09/2000  . ECTOPIC PREGNANCY SURGERY    . ELECTROMAGNETIC NAVIGATION BROCHOSCOPY N/A 10/06/2015   Procedure: ELECTROMAGNETIC NAVIGATION BRONCHOSCOPY;  Surgeon: Flora Lipps, MD;  Location: [facility];  Service: Cardiopulmonary;  Laterality: N/A;  . ENDOBRONCHIAL ULTRASOUND N/A 10/06/2015   Procedure: ENDOBRONCHIAL ULTRASOUND;  Surgeon: Flora Lipps,  MD;  Location: [facility];  Service: Cardiopulmonary;  Laterality: N/A;  . KYPHOPLASTY N/A 09/28/2016   Procedure: KYPHOPLASTY;  Surgeon: Hessie Knows, MD;  Location: [facility];  Service: Orthopedics;   Laterality: N/A;  . KYPHOPLASTY N/A 02/20/2018   Procedure: [id number];  Surgeon: Hessie Knows, MD;  Location: [facility];  Service: Orthopedics;  Laterality: N/A;  . PACEMAKER INSERTION  03/2012  . thorocentesis  12/24/2016  . VAGINAL HYSTERECTOMY     partial - left ovary remains    Social History   Tobacco Use  . Smoking status: Former Smoker    Packs/day: 1.00    Years: 40.00    Pack years: 40.00    Types: Cigarettes    Last attempt to quit: 08/30/2000    Years since quitting: 17.8  . Smokeless tobacco: Never Used  . Tobacco comment: quit smoking in 08/28/2000. Smoking cessation materials not required  Substance Use Topics  . Alcohol use: Yes    Alcohol/week: 10.0 standard drinks    Types: 10 Glasses of wine per week    Comment: 2 glasses of wine per day  . Drug use: No     Medication list has been reviewed and updated.  Current Meds  Medication Sig  . acetaminophen (TYLENOL) 500 MG tablet Take 1,000 mg by mouth every 6 (six) hours as needed for mild pain or moderate pain.   Marland Kitchen amiodarone (PACERONE) 200 MG tablet Take 1 tablet (200 mg total) by mouth daily.  . budesonide (PULMICORT) 0.5 MG/2ML nebulizer solution Take 2 mLs (0.5 mg total) by nebulization 2 (two) times daily.  . Calcium Carb-Cholecalciferol (CALCIUM 600-D PO) Take 1 tablet by mouth every morning.   . cetirizine (ZYRTEC) 10 MG tablet TAKE (1) TABLET BY MOUTH EVERY DAY (Patient taking differently: Take 10 mg by mouth daily. )  . ELIQUIS 5 MG TABS tablet TAKE (1) TABLET BY MOUTH TWICE DAILY (Patient taking differently: Take 5 mg by mouth 2 (two) times daily. )  . ENTRESTO 24-26 MG TAKE (1) TABLET BY MOUTH TWICE DAILY (Patient taking differently: Take 1 tablet by mouth 2 (two) times daily. )  . furosemide (LASIX) 40 MG tablet TAKE (1) TABLET BY MOUTH TWICE DAILY (Patient taking differently: Take 40 mg by mouth 2 (two) times daily. )  . levothyroxine (SYNTHROID, LEVOTHROID) 88 MCG tablet Take 1 tablet (88 mcg  total) by mouth daily before breakfast.  . metoprolol succinate (TOPROL-XL) 25 MG 24 hr tablet TAKE ONE TABLET BY MOUTH EVERY MORNING AND TAKE TWO TABLETS BY MOUTH EVERY EVENING (Patient taking differently: Take 25-50 mg by mouth See admin instructions. Take 25 mg by mouth in the morning and take 25 mg by mouth in the evening. Except do NOT take any on Sunday.)  . potassium chloride SA (K-DUR,KLOR-CON) 20 MEQ tablet Take 1 tablet (20 mEq total) by mouth daily.  . Revefenacin (YUPELRI) 175 MCG/3ML SOLN Inhale 3 mLs into the lungs daily. DX: J76.77  . rosuvastatin (CRESTOR) 5 MG tablet Take 1 tablet (5 mg total) by mouth daily.  Marland Kitchen senna (SENOKOT) 8.6 MG TABS tablet Take 1 tablet (8.6 mg total) by mouth daily. (Patient taking differently: Take 1 tablet by mouth 2 (two) times daily. )    PHQ 2/9 Scores 09/15/2017 08/25/2017 07/28/2017 05/23/2017  PHQ - 2 Score 0 0 2 2  PHQ- 9 Score - - 10 5    Physical Exam  Constitutional: She appears well-developed.  Cardiovascular: Normal rate and regular rhythm.  Pulmonary/Chest: Effort normal. She  has decreased breath sounds in the right lower field.  Abdominal: Soft. There is no tenderness.    BP 116/60 (BP Location: Right Arm, Patient Position: Sitting, Cuff Size: Normal)   Pulse (!) 106   Temp (!) 97.5 F (36.4 C) (Oral)   Ht 5\' 6"  (1.676 m)   Wt 151 lb (68.5 kg)   SpO2 92%   BMI 24.37 kg/m   Assessment and Plan: 1. Acute cystitis without hematuria Continue increased fluids Will treat with 3 days of keflex - POC urinalysis w microscopic (non auto) - cephALEXin (KEFLEX) 250 MG capsule; Take 1 capsule (250 mg total) by mouth 4 (four) times daily for 3 days.  Dispense: 12 capsule; Refill: 0  2. Encounter for immunization - Flu vaccine HIGH DOSE PF   Partially dictated using Editor, commissioning. Any errors are unintentional.  Halina Maidens, MD [facility]  07/04/2018

## 2018-07-05 ENCOUNTER — Telehealth: Payer: Self-pay | Admitting: *Deleted

## 2018-07-05 NOTE — Telephone Encounter (Signed)
Clearance routed to surgeons office

## 2018-07-05 NOTE — Telephone Encounter (Signed)
Message left for patient to call the office.   Note in Epic from Dr. Rockey Situ that patient is at acceptable risk for surgery and to hold Eliquis 2 days prior to surgery with Dr. Genevive Bi .   We need to inform patient of the above.

## 2018-07-05 NOTE — Telephone Encounter (Signed)
Spoke with patient and reviewed recommendations and will route this information to surgeons office. She was appreciative for the call with no further questions at this time.

## 2018-07-05 NOTE — Telephone Encounter (Signed)
Patient called the office back and was notified accordingly.   The patient will call the office if she has further questions.

## 2018-07-06 ENCOUNTER — Other Ambulatory Visit: Payer: Self-pay

## 2018-07-06 ENCOUNTER — Encounter
Admission: RE | Admit: 2018-07-06 | Discharge: 2018-07-06 | Disposition: A | Discharge status: Home / Self Care | Payer: Medicare Other | Source: Ambulatory Visit | Attending: Cardiothoracic Surgery | Admitting: Cardiothoracic Surgery

## 2018-07-06 ENCOUNTER — Ambulatory Visit
Admission: RE | Admit: 2018-07-06 | Discharge: 2018-07-06 | Disposition: A | Discharge status: Home / Self Care | Payer: Medicare Other | Source: Ambulatory Visit | Attending: Cardiothoracic Surgery | Admitting: Cardiothoracic Surgery

## 2018-07-06 ENCOUNTER — Telehealth: Payer: Self-pay | Admitting: Cardiovascular Disease

## 2018-07-06 DIAGNOSIS — Z01818 Encounter for other preprocedural examination: Secondary | ICD-10-CM | POA: Diagnosis present

## 2018-07-06 DIAGNOSIS — J9811 Atelectasis: Secondary | ICD-10-CM | POA: Insufficient documentation

## 2018-07-06 DIAGNOSIS — R918 Other nonspecific abnormal finding of lung field: Secondary | ICD-10-CM | POA: Diagnosis not present

## 2018-07-06 DIAGNOSIS — R9431 Abnormal electrocardiogram [ECG] [EKG]: Secondary | ICD-10-CM | POA: Insufficient documentation

## 2018-07-06 DIAGNOSIS — Z01810 Encounter for preprocedural cardiovascular examination: Secondary | ICD-10-CM

## 2018-07-06 DIAGNOSIS — J90 Pleural effusion, not elsewhere classified: Secondary | ICD-10-CM | POA: Insufficient documentation

## 2018-07-06 LAB — CBC WITH DIFFERENTIAL/PLATELET
Abs Immature Granulocytes: 0.02 10*3/uL (ref 0.00–0.07)
Basophils Absolute: 0 10*3/uL (ref 0.0–0.1)
Basophils Relative: 1 %
EOS ABS: 0.2 10*3/uL (ref 0.0–0.5)
Eosinophils Relative: 4 %
HEMATOCRIT: 42.8 % (ref 36.0–46.0)
HEMOGLOBIN: 13.3 g/dL (ref 12.0–15.0)
Immature Granulocytes: 1 %
LYMPHS ABS: 0.9 10*3/uL (ref 0.7–4.0)
Lymphocytes Relative: 24 %
MCH: 30 pg (ref 26.0–34.0)
MCHC: 31.1 g/dL (ref 30.0–36.0)
MCV: 96.4 fL (ref 80.0–100.0)
MONO ABS: 0.4 10*3/uL (ref 0.1–1.0)
MONOS PCT: 12 %
NRBC: 0 % (ref 0.0–0.2)
Neutro Abs: 2.2 10*3/uL (ref 1.7–7.7)
Neutrophils Relative %: 58 %
Platelets: 218 10*3/uL (ref 150–400)
RBC: 4.44 MIL/uL (ref 3.87–5.11)
RDW: 14.3 % (ref 11.5–15.5)
WBC: 3.7 10*3/uL — ABNORMAL LOW (ref 4.0–10.5)

## 2018-07-06 LAB — COMPREHENSIVE METABOLIC PANEL
ALBUMIN: 3.8 g/dL (ref 3.5–5.0)
ALK PHOS: 81 U/L (ref 38–126)
ALT: 19 U/L (ref 0–44)
ANION GAP: 10 (ref 5–15)
AST: 35 U/L (ref 15–41)
BILIRUBIN TOTAL: 0.8 mg/dL (ref 0.3–1.2)
BUN: 20 mg/dL (ref 8–23)
CALCIUM: 9.3 mg/dL (ref 8.9–10.3)
CO2: 32 mmol/L (ref 22–32)
Chloride: 99 mmol/L (ref 98–111)
Creatinine, Ser: 1.36 mg/dL — ABNORMAL HIGH (ref 0.44–1.00)
GFR calc Af Amer: 41 mL/min — ABNORMAL LOW (ref >60)
GFR calc non Af Amer: 36 mL/min — ABNORMAL LOW (ref >60)
Glucose, Bld: 96 mg/dL (ref 70–99)
Potassium: 4.2 mmol/L (ref 3.5–5.1)
SODIUM: 141 mmol/L (ref 135–145)
Total Protein: 7.9 g/dL (ref 6.5–8.1)

## 2018-07-06 LAB — PROTIME-INR
INR: 1.22
Prothrombin Time: 15.3 seconds — ABNORMAL HIGH (ref 11.4–15.2)

## 2018-07-06 LAB — APTT: aPTT: 37 seconds — ABNORMAL HIGH (ref 24–36)

## 2018-07-06 NOTE — Patient Instructions (Signed)
Your procedure is scheduled on: Tuesday, July 11, 2018 Report to Day Surgery on the 2nd floor of the [facility]. To find out your arrival time, please call [phone] between 1PM - 3PM on: Monday, July 10, 2018  REMEMBER: Instructions that are not followed completely may result in serious medical risk, up to and including death; or upon the discretion of your surgeon and anesthesiologist your surgery may need to be rescheduled.  Do not eat food after midnight the night before surgery.  No gum chewing, lozengers or hard candies.  You may however, drink CLEAR liquids up to 2 hours before you are scheduled to arrive for your surgery. Do not drink anything within 2 hours of the start of your surgery.  Clear liquids include: - water  - apple juice without pulp - gatorade - black coffee or tea (Do NOT add milk or creamers to the coffee or tea) Do NOT drink anything that is not on this list.  No Alcohol for 24 hours before or after surgery.  No Smoking including e-cigarettes for 24 hours prior to surgery.  No chewable tobacco products for at least 6 hours prior to surgery.  No nicotine patches on the day of surgery.  On the morning of surgery brush your teeth with toothpaste and water, you may rinse your mouth with mouthwash if you wish. Do not swallow any toothpaste or mouthwash.  Notify your doctor if there is any change in your medical condition (cold, fever, infection).  Do not wear jewelry, make-up, hairpins, clips or nail polish.  Do not wear lotions, powders, or perfumes.   Do not shave 48 hours prior to surgery.   Contacts and dentures may not be worn into surgery.  Do not bring valuables to the hospital, including drivers license, insurance or credit cards.  [facility] is not responsible for any belongings or valuables.   TAKE THESE MEDICATIONS THE MORNING OF SURGERY:  1.  Amiodarone 2.  pulmicort inhaler 3.  Levothyroxine 4.  Metoprolol 5.  yupelri  nebulizer  Use CHG Soap as directed on instruction sheet.  Follow recommendations from Cardiologist regarding stopping Eliquis.  NOW! Stop Anti-inflammatories (NSAIDS) such as Advil, Aleve, Ibuprofen, Motrin, Naproxen, Naprosyn and Aspirin based products such as Excedrin, Goodys Powder, BC Powder. (May take Tylenol or Acetaminophen if needed.)  NOW!  Stop ANY OVER THE COUNTER supplements until after surgery. (May continue multivitamin.)  Wear comfortable clothing (specific to your surgery type) to the hospital.  If you are being admitted to the hospital overnight, leave your suitcase in the car. After surgery it may be brought to your room.  If you are being discharged the day of surgery, you will not be allowed to drive home. You will need a responsible adult to drive you home and stay with you that night.   If you are taking public transportation, you will need to have a responsible adult with you. Please confirm with your physician that it is acceptable to use public transportation.   Please call [phone] if you have any questions about these instructions.

## 2018-07-06 NOTE — Telephone Encounter (Signed)
Clearance routed to provider.

## 2018-07-06 NOTE — Telephone Encounter (Signed)
° °  [facility] Pre-operative Risk Assessment    Request for surgical clearance:  1. What type of surgery is being performed? R thorascopy possible thoracotomy pleural biopsy with tale pleurodisis and possible pleurx cath   2. When is this surgery scheduled? 11-12  3. What type of clearance is required (medical clearance vs. Pharmacy clearance to hold med vs. Both)? Medical - device    4. Are there any medications that need to be held prior to surgery and how long? Unknown   5. Practice name and name of physician performing surgery? [facility]   6. What is your office phone number [phone]   7.   What is your office fax number [phone]   8.   Anesthesia type (None, local, MAC, general) ?  Unknown    Clarisse Gouge 07/06/2018, 5:07 PM  _________________________________________________________________   (provider comments below)

## 2018-07-10 ENCOUNTER — Encounter: Payer: Self-pay | Admitting: Anesthesiology

## 2018-07-10 MED ORDER — CEFAZOLIN SODIUM-DEXTROSE 2-4 GM/100ML-% IV SOLN
2.0000 g | INTRAVENOUS | Status: AC
  Administered 2018-07-11: 2 g via INTRAVENOUS

## 2018-07-10 NOTE — Telephone Encounter (Signed)
Caryl Pina from [facility] calling States that she had faxed over a pacemaker form that will need to be completed before surgery Will be faxing over form again just in case Once form completed, please fax to [phone]

## 2018-07-11 ENCOUNTER — Inpatient Hospital Stay
Admission: RE | Admit: 2018-07-11 | Discharge: 2018-07-13 | DRG: 167 | Disposition: A | Discharge status: Home Health | Payer: Medicare Other | Attending: Cardiothoracic Surgery | Admitting: Cardiothoracic Surgery

## 2018-07-11 ENCOUNTER — Encounter
Admission: RE | Disposition: A | Discharge status: Home Health | Payer: Self-pay | Source: Home / Self Care | Attending: Cardiothoracic Surgery

## 2018-07-11 ENCOUNTER — Other Ambulatory Visit: Payer: Self-pay

## 2018-07-11 ENCOUNTER — Inpatient Hospital Stay: Payer: Medicare Other

## 2018-07-11 ENCOUNTER — Inpatient Hospital Stay: Payer: Medicare Other | Admitting: Anesthesiology

## 2018-07-11 ENCOUNTER — Other Ambulatory Visit: Payer: Self-pay | Admitting: Internal Medicine

## 2018-07-11 DIAGNOSIS — E785 Hyperlipidemia, unspecified: Secondary | ICD-10-CM | POA: Diagnosis present

## 2018-07-11 DIAGNOSIS — Z951 Presence of aortocoronary bypass graft: Secondary | ICD-10-CM

## 2018-07-11 DIAGNOSIS — I959 Hypotension, unspecified: Secondary | ICD-10-CM | POA: Diagnosis not present

## 2018-07-11 DIAGNOSIS — Z825 Family history of asthma and other chronic lower respiratory diseases: Secondary | ICD-10-CM

## 2018-07-11 DIAGNOSIS — N183 Chronic kidney disease, stage 3 (moderate): Secondary | ICD-10-CM | POA: Diagnosis present

## 2018-07-11 DIAGNOSIS — I5022 Chronic systolic (congestive) heart failure: Secondary | ICD-10-CM | POA: Diagnosis present

## 2018-07-11 DIAGNOSIS — Z888 Allergy status to other drugs, medicaments and biological substances status: Secondary | ICD-10-CM

## 2018-07-11 DIAGNOSIS — I2510 Atherosclerotic heart disease of native coronary artery without angina pectoris: Secondary | ICD-10-CM | POA: Diagnosis present

## 2018-07-11 DIAGNOSIS — E039 Hypothyroidism, unspecified: Secondary | ICD-10-CM | POA: Diagnosis present

## 2018-07-11 DIAGNOSIS — Z9221 Personal history of antineoplastic chemotherapy: Secondary | ICD-10-CM

## 2018-07-11 DIAGNOSIS — Z9981 Dependence on supplemental oxygen: Secondary | ICD-10-CM

## 2018-07-11 DIAGNOSIS — I255 Ischemic cardiomyopathy: Secondary | ICD-10-CM | POA: Diagnosis present

## 2018-07-11 DIAGNOSIS — I252 Old myocardial infarction: Secondary | ICD-10-CM | POA: Diagnosis not present

## 2018-07-11 DIAGNOSIS — Z85118 Personal history of other malignant neoplasm of bronchus and lung: Secondary | ICD-10-CM | POA: Diagnosis not present

## 2018-07-11 DIAGNOSIS — Z86718 Personal history of other venous thrombosis and embolism: Secondary | ICD-10-CM

## 2018-07-11 DIAGNOSIS — I25810 Atherosclerosis of coronary artery bypass graft(s) without angina pectoris: Secondary | ICD-10-CM | POA: Diagnosis present

## 2018-07-11 DIAGNOSIS — K219 Gastro-esophageal reflux disease without esophagitis: Secondary | ICD-10-CM | POA: Diagnosis present

## 2018-07-11 DIAGNOSIS — J449 Chronic obstructive pulmonary disease, unspecified: Secondary | ICD-10-CM | POA: Diagnosis present

## 2018-07-11 DIAGNOSIS — Z7951 Long term (current) use of inhaled steroids: Secondary | ICD-10-CM

## 2018-07-11 DIAGNOSIS — Z79890 Hormone replacement therapy (postmenopausal): Secondary | ICD-10-CM

## 2018-07-11 DIAGNOSIS — Z90710 Acquired absence of both cervix and uterus: Secondary | ICD-10-CM | POA: Diagnosis not present

## 2018-07-11 DIAGNOSIS — G629 Polyneuropathy, unspecified: Secondary | ICD-10-CM | POA: Diagnosis present

## 2018-07-11 DIAGNOSIS — Z9861 Coronary angioplasty status: Secondary | ICD-10-CM | POA: Diagnosis not present

## 2018-07-11 DIAGNOSIS — C3401 Malignant neoplasm of right main bronchus: Secondary | ICD-10-CM

## 2018-07-11 DIAGNOSIS — I130 Hypertensive heart and chronic kidney disease with heart failure and stage 1 through stage 4 chronic kidney disease, or unspecified chronic kidney disease: Secondary | ICD-10-CM | POA: Diagnosis present

## 2018-07-11 DIAGNOSIS — Z952 Presence of prosthetic heart valve: Secondary | ICD-10-CM | POA: Diagnosis not present

## 2018-07-11 DIAGNOSIS — I480 Paroxysmal atrial fibrillation: Secondary | ICD-10-CM | POA: Diagnosis not present

## 2018-07-11 DIAGNOSIS — J90 Pleural effusion, not elsewhere classified: Secondary | ICD-10-CM | POA: Diagnosis present

## 2018-07-11 DIAGNOSIS — Z923 Personal history of irradiation: Secondary | ICD-10-CM | POA: Diagnosis not present

## 2018-07-11 DIAGNOSIS — Z8249 Family history of ischemic heart disease and other diseases of the circulatory system: Secondary | ICD-10-CM

## 2018-07-11 DIAGNOSIS — Z09 Encounter for follow-up examination after completed treatment for conditions other than malignant neoplasm: Secondary | ICD-10-CM

## 2018-07-11 DIAGNOSIS — Z950 Presence of cardiac pacemaker: Secondary | ICD-10-CM

## 2018-07-11 DIAGNOSIS — Z87891 Personal history of nicotine dependence: Secondary | ICD-10-CM

## 2018-07-11 DIAGNOSIS — Z79899 Other long term (current) drug therapy: Secondary | ICD-10-CM

## 2018-07-11 DIAGNOSIS — Z7901 Long term (current) use of anticoagulants: Secondary | ICD-10-CM

## 2018-07-11 HISTORY — PX: VIDEO ASSISTED THORACOSCOPY: SHX5073

## 2018-07-11 HISTORY — PX: FLEXIBLE BRONCHOSCOPY: SHX5094

## 2018-07-11 HISTORY — PX: CHEST TUBE INSERTION: SHX231

## 2018-07-11 HISTORY — PX: VIDEO ASSISTED THORACOSCOPY (VATS) W/TALC PLEUADESIS: SHX6168

## 2018-07-11 LAB — GLUCOSE, CAPILLARY: Glucose-Capillary: 239 mg/dL — ABNORMAL HIGH (ref 70–99)

## 2018-07-11 LAB — MRSA PCR SCREENING: MRSA BY PCR: NEGATIVE

## 2018-07-11 LAB — ABO/RH: ABO/RH(D): O POS

## 2018-07-11 SURGERY — BRONCHOSCOPY, FLEXIBLE
Anesthesia: General | Laterality: Right

## 2018-07-11 MED ORDER — SODIUM CHLORIDE FLUSH 0.9 % IV SOLN
INTRAVENOUS | Status: AC
  Filled 2018-07-11: qty 10

## 2018-07-11 MED ORDER — SODIUM CHLORIDE (PF) 0.9 % IJ SOLN
INTRAMUSCULAR | Status: AC
  Filled 2018-07-11: qty 50

## 2018-07-11 MED ORDER — AMIODARONE HCL 200 MG PO TABS
200.0000 mg | ORAL_TABLET | Freq: Every day | ORAL | Status: DC
  Administered 2018-07-12 – 2018-07-13 (×2): 200 mg via ORAL
  Filled 2018-07-11 (×2): qty 1

## 2018-07-11 MED ORDER — POTASSIUM CHLORIDE CRYS ER 20 MEQ PO TBCR
20.0000 meq | EXTENDED_RELEASE_TABLET | Freq: Every day | ORAL | Status: DC
  Administered 2018-07-11 – 2018-07-13 (×3): 20 meq via ORAL
  Filled 2018-07-11: qty 1
  Filled 2018-07-11: qty 2
  Filled 2018-07-11: qty 1

## 2018-07-11 MED ORDER — BUPIVACAINE HCL 0.25 % IJ SOLN
INTRAMUSCULAR | Status: DC | PRN
  Administered 2018-07-11: 30 mL

## 2018-07-11 MED ORDER — ROCURONIUM BROMIDE 50 MG/5ML IV SOLN
INTRAVENOUS | Status: AC
  Filled 2018-07-11: qty 1

## 2018-07-11 MED ORDER — DEXAMETHASONE SODIUM PHOSPHATE 10 MG/ML IJ SOLN
INTRAMUSCULAR | Status: DC | PRN
  Administered 2018-07-11: 10 mg via INTRAVENOUS

## 2018-07-11 MED ORDER — ROCURONIUM BROMIDE 100 MG/10ML IV SOLN
INTRAVENOUS | Status: DC | PRN
  Administered 2018-07-11: 35 mg via INTRAVENOUS
  Administered 2018-07-11: 15 mg via INTRAVENOUS

## 2018-07-11 MED ORDER — BUPIVACAINE LIPOSOME 1.3 % IJ SUSP
INTRAMUSCULAR | Status: AC
  Filled 2018-07-11: qty 20

## 2018-07-11 MED ORDER — DEXAMETHASONE SODIUM PHOSPHATE 10 MG/ML IJ SOLN
INTRAMUSCULAR | Status: AC
  Filled 2018-07-11: qty 1

## 2018-07-11 MED ORDER — TALC (STERITALC) POWDER FOR INTRAPLEURAL USE
INTRAPLEURAL | Status: AC
  Filled 2018-07-11: qty 4

## 2018-07-11 MED ORDER — DEXTROSE-NACL 5-0.45 % IV SOLN
INTRAVENOUS | Status: DC
  Administered 2018-07-12: via INTRAVENOUS

## 2018-07-11 MED ORDER — FENTANYL CITRATE (PF) 100 MCG/2ML IJ SOLN
INTRAMUSCULAR | Status: AC
  Filled 2018-07-11: qty 2

## 2018-07-11 MED ORDER — ONDANSETRON HCL 4 MG/2ML IJ SOLN
INTRAMUSCULAR | Status: DC | PRN
  Administered 2018-07-11: 4 mg via INTRAVENOUS

## 2018-07-11 MED ORDER — REVEFENACIN 175 MCG/3ML IN SOLN: 3.0000 mL | Freq: Every day | RESPIRATORY_TRACT | Status: DC

## 2018-07-11 MED ORDER — METOPROLOL SUCCINATE ER 50 MG PO TB24
25.0000 mg | ORAL_TABLET | ORAL | Status: DC
  Administered 2018-07-13: 18:00:00 50 mg via ORAL
  Filled 2018-07-11: qty 1

## 2018-07-11 MED ORDER — EPHEDRINE SULFATE 50 MG/ML IJ SOLN
INTRAMUSCULAR | Status: AC
  Filled 2018-07-11: qty 1

## 2018-07-11 MED ORDER — FUROSEMIDE 40 MG PO TABS
40.0000 mg | ORAL_TABLET | Freq: Two times a day (BID) | ORAL | Status: DC
Start: 2018-07-11 — End: 2018-07-13
  Administered 2018-07-11 – 2018-07-12 (×3): 40 mg via ORAL
  Filled 2018-07-11 (×2): qty 1
  Filled 2018-07-11 (×3): qty 2

## 2018-07-11 MED ORDER — BUPIVACAINE HCL (PF) 0.25 % IJ SOLN
INTRAMUSCULAR | Status: AC
  Filled 2018-07-11: qty 30

## 2018-07-11 MED ORDER — ONDANSETRON HCL 4 MG/2ML IJ SOLN
INTRAMUSCULAR | Status: AC
  Filled 2018-07-11: qty 2

## 2018-07-11 MED ORDER — SEVOFLURANE IN SOLN
RESPIRATORY_TRACT | Status: AC
  Filled 2018-07-11: qty 250

## 2018-07-11 MED ORDER — CHLORHEXIDINE GLUCONATE CLOTH 2 % EX PADS
6.0000 | MEDICATED_PAD | Freq: Once | CUTANEOUS | Status: AC
  Administered 2018-07-11: 6 via TOPICAL

## 2018-07-11 MED ORDER — SODIUM CHLORIDE 0.9 % IV SOLN
INTRAVENOUS | Status: DC | PRN
  Administered 2018-07-11: 30 ug/min via INTRAVENOUS

## 2018-07-11 MED ORDER — MIDAZOLAM HCL 2 MG/2ML IJ SOLN
INTRAMUSCULAR | Status: DC | PRN
  Administered 2018-07-11 (×2): .5 mg via INTRAVENOUS

## 2018-07-11 MED ORDER — PROPOFOL 10 MG/ML IV BOLUS
INTRAVENOUS | Status: AC
  Filled 2018-07-11: qty 20

## 2018-07-11 MED ORDER — FAMOTIDINE 20 MG PO TABS
20.0000 mg | ORAL_TABLET | Freq: Once | ORAL | Status: AC
  Administered 2018-07-11: 20 mg via ORAL

## 2018-07-11 MED ORDER — ACETAMINOPHEN 10 MG/ML IV SOLN
INTRAVENOUS | Status: AC
  Filled 2018-07-11: qty 100

## 2018-07-11 MED ORDER — LEVOTHYROXINE SODIUM 88 MCG PO TABS
88.0000 ug | ORAL_TABLET | Freq: Every day | ORAL | Status: DC
  Administered 2018-07-12 – 2018-07-13 (×2): 88 ug via ORAL
  Filled 2018-07-11 (×3): qty 1

## 2018-07-11 MED ORDER — HEPARIN SODIUM (PORCINE) 5000 UNIT/ML IJ SOLN
INTRAMUSCULAR | Status: AC
  Filled 2018-07-11: qty 1

## 2018-07-11 MED ORDER — SUGAMMADEX SODIUM 500 MG/5ML IV SOLN
INTRAVENOUS | Status: DC | PRN
  Administered 2018-07-11: 136 mg via INTRAVENOUS

## 2018-07-11 MED ORDER — PROPOFOL 10 MG/ML IV BOLUS
INTRAVENOUS | Status: DC | PRN
  Administered 2018-07-11: 100 mg via INTRAVENOUS

## 2018-07-11 MED ORDER — ROSUVASTATIN CALCIUM 10 MG PO TABS
5.0000 mg | ORAL_TABLET | Freq: Every day | ORAL | Status: DC
  Administered 2018-07-11 – 2018-07-13 (×3): 5 mg via ORAL
  Filled 2018-07-11 (×5): qty 1

## 2018-07-11 MED ORDER — LIDOCAINE HCL URETHRAL/MUCOSAL 2 % EX GEL
CUTANEOUS | Status: AC
  Filled 2018-07-11: qty 5

## 2018-07-11 MED ORDER — MORPHINE SULFATE (PF) 2 MG/ML IV SOLN: 1.0000 mg | INTRAVENOUS | Status: DC | PRN

## 2018-07-11 MED ORDER — PHENYLEPHRINE HCL 10 MG/ML IJ SOLN
INTRAMUSCULAR | Status: DC | PRN
  Administered 2018-07-11: 100 ug via INTRAVENOUS

## 2018-07-11 MED ORDER — ONDANSETRON HCL 4 MG/2ML IJ SOLN: 4.0000 mg | Freq: Once | INTRAMUSCULAR | Status: DC | PRN

## 2018-07-11 MED ORDER — LIDOCAINE HCL (PF) 2 % IJ SOLN
INTRAMUSCULAR | Status: AC
  Filled 2018-07-11: qty 10

## 2018-07-11 MED ORDER — SACUBITRIL-VALSARTAN 24-26 MG PO TABS
1.0000 | ORAL_TABLET | Freq: Two times a day (BID) | ORAL | Status: DC
  Administered 2018-07-12: 1 via ORAL
  Filled 2018-07-11 (×4): qty 1

## 2018-07-11 MED ORDER — FENTANYL CITRATE (PF) 100 MCG/2ML IJ SOLN
INTRAMUSCULAR | Status: DC | PRN
  Administered 2018-07-11 (×4): 25 ug via INTRAVENOUS

## 2018-07-11 MED ORDER — FAMOTIDINE 20 MG PO TABS
ORAL_TABLET | ORAL | Status: AC
  Administered 2018-07-11: 20 mg via ORAL
  Filled 2018-07-11: qty 1

## 2018-07-11 MED ORDER — ONDANSETRON HCL 4 MG/2ML IJ SOLN: 4.0000 mg | Freq: Four times a day (QID) | INTRAMUSCULAR | Status: DC | PRN

## 2018-07-11 MED ORDER — CEFAZOLIN SODIUM-DEXTROSE 2-4 GM/100ML-% IV SOLN
2.0000 g | Freq: Three times a day (TID) | INTRAVENOUS | Status: AC
  Administered 2018-07-11 (×2): 2 g via INTRAVENOUS
  Filled 2018-07-11 (×2): qty 100

## 2018-07-11 MED ORDER — LIDOCAINE HCL (CARDIAC) PF 100 MG/5ML IV SOSY
PREFILLED_SYRINGE | INTRAVENOUS | Status: DC | PRN
  Administered 2018-07-11: 100 mg via INTRAVENOUS

## 2018-07-11 MED ORDER — TALC 5 G PL SUSR
INTRAPLEURAL | Status: DC | PRN
  Administered 2018-07-11: 4 g via INTRAPLEURAL

## 2018-07-11 MED ORDER — CHLORHEXIDINE GLUCONATE CLOTH 2 % EX PADS: 6.0000 | MEDICATED_PAD | Freq: Once | CUTANEOUS | Status: DC

## 2018-07-11 MED ORDER — BISACODYL 5 MG PO TBEC
10.0000 mg | DELAYED_RELEASE_TABLET | Freq: Every day | ORAL | Status: DC
  Administered 2018-07-12 – 2018-07-13 (×2): 10 mg via ORAL
  Filled 2018-07-11 (×2): qty 2

## 2018-07-11 MED ORDER — LACTATED RINGERS IV SOLN
INTRAVENOUS | Status: DC
  Administered 2018-07-11 (×2): via INTRAVENOUS

## 2018-07-11 MED ORDER — CEFAZOLIN SODIUM-DEXTROSE 2-4 GM/100ML-% IV SOLN
INTRAVENOUS | Status: AC
  Filled 2018-07-11: qty 100

## 2018-07-11 MED ORDER — BUDESONIDE 0.5 MG/2ML IN SUSP
0.5000 mg | Freq: Two times a day (BID) | RESPIRATORY_TRACT | Status: DC
  Administered 2018-07-11 – 2018-07-13 (×4): 0.5 mg via RESPIRATORY_TRACT
  Filled 2018-07-11 (×5): qty 2

## 2018-07-11 MED ORDER — OXYCODONE HCL 5 MG PO TABS
5.0000 mg | ORAL_TABLET | ORAL | Status: DC | PRN
  Administered 2018-07-11 – 2018-07-12 (×4): 10 mg via ORAL
  Administered 2018-07-12 – 2018-07-13 (×2): 5 mg via ORAL
  Filled 2018-07-11: qty 2
  Filled 2018-07-11: qty 1
  Filled 2018-07-11: qty 2
  Filled 2018-07-11: qty 1
  Filled 2018-07-11 (×2): qty 2

## 2018-07-11 MED ORDER — ACETAMINOPHEN 10 MG/ML IV SOLN
INTRAVENOUS | Status: DC | PRN
  Administered 2018-07-11: 1000 mg via INTRAVENOUS

## 2018-07-11 MED ORDER — FENTANYL CITRATE (PF) 100 MCG/2ML IJ SOLN
25.0000 ug | INTRAMUSCULAR | Status: DC | PRN
  Administered 2018-07-11 (×4): 25 ug via INTRAVENOUS

## 2018-07-11 MED ORDER — MIDAZOLAM HCL 2 MG/2ML IJ SOLN
INTRAMUSCULAR | Status: AC
  Filled 2018-07-11: qty 2

## 2018-07-11 SURGICAL SUPPLY — 99 items
BENZOIN TINCTURE PRP APPL 2/3 (GAUZE/BANDAGES/DRESSINGS) ×2 IMPLANT
BLADE SURG SZ11 CARB STEEL (BLADE) ×3 IMPLANT
BNDG COHESIVE 4X5 TAN STRL (GAUZE/BANDAGES/DRESSINGS) ×2 IMPLANT
BRONCHOSCOPE PED SLIM DISP (MISCELLANEOUS) ×3 IMPLANT
CANISTER SUCT 1200ML W/VALVE (MISCELLANEOUS) ×3 IMPLANT
CATH URET ROBINSON 16FR STRL (CATHETERS) ×2 IMPLANT
CHLORAPREP W/TINT 26ML (MISCELLANEOUS) ×5 IMPLANT
CNTNR SPEC 2.5X3XGRAD LEK (MISCELLANEOUS)
CONN REDUCER 1/4X3/8 STR (CONNECTOR) ×3
CONNECTOR REDUCER 1/4X3/8 STR (CONNECTOR) IMPLANT
CONT SPEC 4OZ STER OR WHT (MISCELLANEOUS)
CONTAINER SPEC 2.5X3XGRAD LEK (MISCELLANEOUS) ×8 IMPLANT
CUTTER ECHEON FLEX ENDO 45 340 (ENDOMECHANICALS) ×2 IMPLANT
DEFOGGER SCOPE WARMER CLEARIFY (MISCELLANEOUS) ×3 IMPLANT
DRAIN CHANNEL 28F RND 3/8 FF (WOUND CARE) ×1 IMPLANT
DRAIN CHEST DRY SUCT SGL (MISCELLANEOUS) ×3 IMPLANT
DRAPE C-SECTION (MISCELLANEOUS) ×3 IMPLANT
DRAPE INCISE IOBAN 66X45 STRL (DRAPES) ×2 IMPLANT
DRAPE LAPAROTOMY 77X122 PED (DRAPES) ×2 IMPLANT
DRAPE MAG INST 16X20 L/F (DRAPES) ×2 IMPLANT
DRAPE POUCH INSTRU U-SHP 10X18 (DRAPES) ×3 IMPLANT
DRSG OPSITE POSTOP 3X4 (GAUZE/BANDAGES/DRESSINGS) ×6 IMPLANT
DRSG OPSITE POSTOP 4X6 (GAUZE/BANDAGES/DRESSINGS) ×2 IMPLANT
DRSG OPSITE POSTOP 4X8 (GAUZE/BANDAGES/DRESSINGS) ×2 IMPLANT
DRSG TELFA 3X8 NADH (GAUZE/BANDAGES/DRESSINGS) ×3 IMPLANT
ELECT BLADE 6 FLAT ULTRCLN (ELECTRODE) ×3 IMPLANT
ELECT BLADE 6.5 EXT (BLADE) ×3 IMPLANT
ELECT CAUTERY BLADE TIP 2.5 (TIP) ×3
ELECT REM PT RETURN 9FT ADLT (ELECTROSURGICAL) ×3
ELECTRODE CAUTERY BLDE TIP 2.5 (TIP) ×2 IMPLANT
ELECTRODE REM PT RTRN 9FT ADLT (ELECTROSURGICAL) ×2 IMPLANT
GAUZE SPONGE 4X4 12PLY STRL (GAUZE/BANDAGES/DRESSINGS) ×3 IMPLANT
GLOVE SURG SYN 7.5  E (GLOVE) ×5
GLOVE SURG SYN 7.5 E (GLOVE) ×10 IMPLANT
GLOVE SURG SYN 7.5 PF PI (GLOVE) ×4 IMPLANT
GOWN STRL REUS W/ TWL LRG LVL3 (GOWN DISPOSABLE) ×8 IMPLANT
GOWN STRL REUS W/TWL LRG LVL3 (GOWN DISPOSABLE) ×3
KIT PLEURX DRAIN CATH 15.5FR (DRAIN) ×3 IMPLANT
KIT TURNOVER KIT A (KITS) ×3 IMPLANT
LABEL OR SOLS (LABEL) ×3 IMPLANT
LOOP RED MAXI  1X406MM (MISCELLANEOUS)
LOOP VESSEL MAXI 1X406 RED (MISCELLANEOUS) ×2 IMPLANT
MARKER SKIN DUAL TIP RULER LAB (MISCELLANEOUS) ×5 IMPLANT
NDL FILTER BLUNT 18X1 1/2 (NEEDLE) ×2 IMPLANT
NDL SPNL 20GX3.5 QUINCKE YW (NEEDLE) ×2 IMPLANT
NDL SPNL 22GX3.5 QUINCKE BK (NEEDLE) ×2 IMPLANT
NEEDLE FILTER BLUNT 18X 1/2SAF (NEEDLE) ×1
NEEDLE FILTER BLUNT 18X1 1/2 (NEEDLE) ×2 IMPLANT
NEEDLE HYPO 22GX1.5 SAFETY (NEEDLE) ×1 IMPLANT
NEEDLE SPNL 20GX3.5 QUINCKE YW (NEEDLE) ×3 IMPLANT
NEEDLE SPNL 22GX3.5 QUINCKE BK (NEEDLE) ×3 IMPLANT
NS IRRIG 500ML POUR BTL (IV SOLUTION) ×2 IMPLANT
PACK BASIN MAJOR ARMC (MISCELLANEOUS) ×3 IMPLANT
PACK BASIN MINOR ARMC (MISCELLANEOUS) ×3 IMPLANT
PAD DRESSING TELFA 3X8 NADH (GAUZE/BANDAGES/DRESSINGS) ×2 IMPLANT
RELOAD STAPLER LINE PROX 30 GR (STAPLE) IMPLANT
SCISSORS METZENBAUM CVD 33 (INSTRUMENTS) IMPLANT
SOL ANTI-FOG 6CC FOG-OUT (MISCELLANEOUS) IMPLANT
SOL FOG-OUT ANTI-FOG 6CC (MISCELLANEOUS) ×1
SPONGE KITTNER 5P (MISCELLANEOUS) ×5 IMPLANT
STAPLER RELOAD LINE PROX 30 GR (STAPLE)
STAPLER RELOADABLE 30 GRN THCK (STAPLE) ×2 IMPLANT
STAPLER SKIN PROX 35W (STAPLE) ×2 IMPLANT
STAPLER VASCULAR ECHELON 35 (CUTTER) ×2 IMPLANT
STRIP CLOSURE SKIN 1/2X4 (GAUZE/BANDAGES/DRESSINGS) ×3 IMPLANT
SUCTION FRAZIER HANDLE 10FR (MISCELLANEOUS)
SUCTION TUBE FRAZIER 10FR DISP (MISCELLANEOUS) ×2 IMPLANT
SUT ETHILON 3-0 FS-10 30 BLK (SUTURE) ×3
SUT ETHILON 4-0 (SUTURE) ×1
SUT ETHILON 4-0 FS2 18XMFL BLK (SUTURE) ×2
SUT MNCRL AB 3-0 PS2 27 (SUTURE) ×5 IMPLANT
SUT PROLENE 5 0 RB 1 DA (SUTURE) IMPLANT
SUT SILK 0 (SUTURE)
SUT SILK 0 30XBRD TIE 6 (SUTURE) ×2 IMPLANT
SUT SILK 1 SH (SUTURE) ×19 IMPLANT
SUT VIC AB 0 CT1 36 (SUTURE) ×5 IMPLANT
SUT VIC AB 0 SH 27 (SUTURE) ×2 IMPLANT
SUT VIC AB 2-0 CT1 27 (SUTURE)
SUT VIC AB 2-0 CT1 TAPERPNT 27 (SUTURE) ×4 IMPLANT
SUT VIC AB 2-0 CT2 27 (SUTURE) ×6 IMPLANT
SUT VIC AB 2-0 SH 27 (SUTURE)
SUT VIC AB 2-0 SH 27XBRD (SUTURE) ×2 IMPLANT
SUT VIC AB 3-0 SH 27 (SUTURE) ×1
SUT VIC AB 3-0 SH 27X BRD (SUTURE) IMPLANT
SUT VICRYL 2 TP 1 (SUTURE) ×8 IMPLANT
SUTURE EHLN 3-0 FS-10 30 BLK (SUTURE) ×2 IMPLANT
SUTURE ETHLN 4-0 FS2 18XMF BLK (SUTURE) IMPLANT
SYR 10ML SLIP (SYRINGE) ×2 IMPLANT
SYR 30ML LL (SYRINGE) ×2 IMPLANT
SYR 3ML LL SCALE MARK (SYRINGE) ×1 IMPLANT
SYR BULB IRRIG 60ML STRL (SYRINGE) ×2 IMPLANT
TAPE CLOTH 3X10 WHT NS LF (GAUZE/BANDAGES/DRESSINGS) ×3 IMPLANT
TAPE TRANSPORE STRL 2 31045 (GAUZE/BANDAGES/DRESSINGS) ×2 IMPLANT
TRAY FOLEY MTR SLVR 16FR STAT (SET/KITS/TRAYS/PACK) ×3 IMPLANT
TROCAR FLEXIPATH 20X80 (ENDOMECHANICALS) ×1 IMPLANT
TROCAR FLEXIPATH THORACIC 15MM (ENDOMECHANICALS) IMPLANT
TUBING CONNECTING 10 (TUBING) ×2 IMPLANT
WATER STERILE IRR 1000ML POUR (IV SOLUTION) ×3 IMPLANT
YANKAUER SUCT BULB TIP FLEX NO (MISCELLANEOUS) ×3 IMPLANT

## 2018-07-11 NOTE — Anesthesia Procedure Notes (Signed)
Procedure Name: Intubation Date/Time: 07/11/2018 7:49 AM Performed by: Allean Found, CRNA Pre-anesthesia Checklist: Patient identified, Patient being monitored, Timeout performed, Emergency Drugs available and Suction available Patient Re-evaluated:Patient Re-evaluated prior to induction Oxygen Delivery Method: Circle system utilized Preoxygenation: Pre-oxygenation with 100% oxygen Induction Type: IV induction Ventilation: Mask ventilation without difficulty Laryngoscope Size: Mac and 3 Grade View: Grade I Tube type: Oral Endobronchial tube: Left, EBT position confirmed by fiberoptic bronchoscope and EBT position confirmed by auscultation and 35 Fr Tube size: 7.0 mm Number of attempts: 1 Airway Equipment and Method: Stylet Placement Confirmation: ETT inserted through vocal cords under direct vision,  positive ETCO2 and breath sounds checked- equal and bilateral Secured at: 21 cm Tube secured with: Tape Dental Injury: Teeth and Oropharynx as per pre-operative assessment

## 2018-07-11 NOTE — Op Note (Signed)
  07/11/2018  9:49 AM  PATIENT:  Jenny Cooper  82 y.o. female  PRE-OPERATIVE DIAGNOSIS: Recurrent right-sided pleural effusion  POST-OPERATIVE DIAGNOSIS: Same  PROCEDURE: Preoperative bronchoscopy to assess endobronchial anatomy; right thoracoscopy with pleural biopsy; talc insufflation for pleurodesis; insertion of Pleurx catheter.  SURGEON:  Surgeon(s) and Role:    * [name], MD - Primary    * Fredirick Maudlin, MD - Assisting  ASSISTANTS: None  ANESTHESIA: General  INDICATIONS FOR PROCEDURE this patient is an 82 year old female who is status post radiation therapy and chemotherapy for right lung cancer.  She is had several recurrent pleural effusions have required thoracentesis and she was offered the above named procedure for definitive diagnosis and treatment.  DICTATION: Patient was brought to the operating suite placed in the supine position.  General endotracheal anesthesia was given with a double-lumen tube.  Preoperative bronchoscopy was carried out.  The right upper lobe orifice was slightly narrowed and fibrotic.  The right upper lobe was widely patent however.  The bronchus intermedius had some fibrotic changes along the lateral aspect.  The middle lower lobes were widely patent.  The endotracheal tube was positioned in the left mainstem bronchus and the left side was grossly within normal limits.  The patient was then turned for a right thoracoscopy.  A skin incision was created measuring approximately 20 mm to accommodate a 20 mm trocar.  Once the chest was entered the right lung was deflated.  Thoracoscopy was carried out.  600 cc of clear yellowish tinged fluid was aspirated from the right chest.  Thoracoscope was introduced and we could see that there is some adhesions to the chest wall anteriorly from the middle and upper lobes.  The pleura had several areas of pleural plaquing but there was nothing that appeared to be compatible with a malignancy.  Multiple random  pleural biopsies were taken and sent for permanent section.  4 g of sterile talc were then insufflated under direct visualization coating the entire hemithorax.  The chest was then drained with a 65 Blake brought out through a separate stab wound and positioned along the paravertebral space.  A Pleurx catheter was also inserted as well brought out through a separate stab wound anteriorly.  The thoracoscopy port was then closed with multiple interrupted Vicryl sutures.  The skin was closed with Monocryl and Steri-Strips were applied.  All tubes were secured with silk to the skin and sterile dressings were applied.  The wand was used to ensure that no sponge needle and instruments were left behind.  The patient tolerated procedure well was extubated and taken to the recovery room in stable condition.   [name], MD

## 2018-07-11 NOTE — Interval H&P Note (Signed)
History and Physical Interval Note:  07/11/2018 7:11 AM  Jenny Cooper  has presented today for surgery, with the diagnosis of RECURRENT RIGHT SIDED PLEURAL EFFUSION  The various methods of treatment have been discussed with the patient and family. After consideration of risks, benefits and other options for treatment, the patient has consented to  Procedure(s): FLEXIBLE BRONCHOSCOPY (N/A) VIDEO ASSISTED THORACOSCOPY (Right) THORACOTOMY PLEURAL BIOPSY WITH TALC PLEURODESIS (N/A) PLEURX CATH INSERTION (N/A) as a surgical intervention .  The patient's history has been reviewed, patient examined, no change in status, stable for surgery.  I have reviewed the patient's chart and labs.  Questions were answered to the patient's satisfaction.     [name]

## 2018-07-11 NOTE — Progress Notes (Signed)
Per Care Everywhere Note:  Date of Service: 05/03/2018 Primary Electrophysiologist: Dr. Norm Salt   Jenny Cooper had a successful remote transmission today. This transmission demonstrated stable battery and lead function.   Spoke with [employer] Rep. Maurilio Lovely regarding intraop care- per his specification a magnet is to be used during the surgery, no interrogation will be necessary post operatively.

## 2018-07-11 NOTE — Anesthesia Post-op Follow-up Note (Signed)
Anesthesia QCDR form completed.        

## 2018-07-11 NOTE — Anesthesia Postprocedure Evaluation (Signed)
Anesthesia Post Note  Patient: ABREA HENLE  Procedure(s) Performed: FLEXIBLE BRONCHOSCOPY (Right ) VIDEO ASSISTED THORACOSCOPY (Right ) THORACOTOMY PLEURAL BIOPSY WITH TALC PLEURODESIS (Right ) PLEURX CATH INSERTION (N/A )  Patient location during evaluation: PACU Anesthesia Type: General Level of consciousness: awake and alert Pain management: pain level controlled Vital Signs Assessment: post-procedure vital signs reviewed and stable Respiratory status: spontaneous breathing, nonlabored ventilation, respiratory function stable and patient connected to nasal cannula oxygen Cardiovascular status: blood pressure returned to baseline and stable Postop Assessment: no apparent nausea or vomiting Anesthetic complications: no     Last Vitals:  Vitals:   07/11/18 1037 07/11/18 1154  BP: (!) 98/50 110/62  Pulse: (!) 54 60  Resp: 18 13  Temp: (!) 36.2 C (!) 35.8 C  SpO2: 94% 92%    Last Pain:  Vitals:   07/11/18 1154  TempSrc: Oral  PainSc: [place]

## 2018-07-11 NOTE — Anesthesia Preprocedure Evaluation (Signed)
Anesthesia Evaluation  Patient identified by MRN, date of birth, ID band Patient awake    Reviewed: Allergy & Precautions, NPO status , Patient's Chart, lab work & pertinent test results, reviewed documented beta blocker date and time   Airway Mallampati: II  TM Distance: >3 FB     Dental  (+) Chipped   Pulmonary COPD, former smoker,           Cardiovascular hypertension, Pt. on medications and Pt. on home beta blockers + angina + CAD, + Past MI, + CABG and +CHF  + dysrhythmias Atrial Fibrillation and Supra Ventricular Tachycardia + pacemaker      Neuro/Psych  Neuromuscular disease    GI/Hepatic GERD  Controlled,  Endo/Other  diabetes, Type 2Hypothyroidism   Renal/GU      Musculoskeletal   Abdominal   Peds  Hematology   Anesthesia Other Findings EKG paced beats. EF 49. Underwent ablation. MV replacement. Pleural effusion.  Reproductive/Obstetrics                             Anesthesia Physical Anesthesia Plan  ASA: III  Anesthesia Plan: General   Post-op Pain Management:    Induction: Intravenous  PONV Risk Score and Plan:   Airway Management Planned: Double Lumen EBT  Additional Equipment:   Intra-op Plan:   Post-operative Plan:   Informed Consent: I have reviewed the patients History and Physical, chart, labs and discussed the procedure including the risks, benefits and alternatives for the proposed anesthesia with the patient or authorized representative who has indicated his/her understanding and acceptance.     Plan Discussed with: CRNA  Anesthesia Plan Comments:         Anesthesia Quick Evaluation

## 2018-07-11 NOTE — OR Nursing (Signed)
T&S and prepare 2 units of PRBC per Dr Marcello Moores.  Dr Marcello Moores also reviewed notes about Pacemaker and spoke with  [facility] RN.  No new orders regarding pacemaker at this time.

## 2018-07-11 NOTE — Transfer of Care (Signed)
Immediate Anesthesia Transfer of Care Note  Patient: Jenny Cooper  Procedure(s) Performed: FLEXIBLE BRONCHOSCOPY (Right ) VIDEO ASSISTED THORACOSCOPY (Right ) THORACOTOMY PLEURAL BIOPSY WITH TALC PLEURODESIS (Right ) PLEURX CATH INSERTION (N/A )  Patient Location: PACU  Anesthesia Type:General  Level of Consciousness: awake and oriented  Airway & Oxygen Therapy: Patient Spontanous Breathing and Patient connected to face mask oxygen  Post-op Assessment: Report given to RN and Post -op Vital signs reviewed and stable  Post vital signs: Reviewed and stable  Last Vitals:  Vitals Value Taken Time  BP 142/79 07/11/2018  9:37 AM  Temp    Pulse 60 07/11/2018  9:38 AM  Resp 23 07/11/2018  9:38 AM  SpO2 97 % 07/11/2018  9:38 AM  Vitals shown include unvalidated device data.  Last Pain:  Vitals:   07/11/18 0616  TempSrc: Temporal  PainSc: 0-No pain         Complications: No apparent anesthesia complications

## 2018-07-12 ENCOUNTER — Inpatient Hospital Stay: Payer: Medicare Other

## 2018-07-12 ENCOUNTER — Encounter: Payer: Self-pay | Admitting: Cardiothoracic Surgery

## 2018-07-12 DIAGNOSIS — I480 Paroxysmal atrial fibrillation: Secondary | ICD-10-CM

## 2018-07-12 DIAGNOSIS — I5022 Chronic systolic (congestive) heart failure: Secondary | ICD-10-CM

## 2018-07-12 LAB — PREPARE RBC (CROSSMATCH)

## 2018-07-12 LAB — COMPREHENSIVE METABOLIC PANEL
ALT: 12 U/L (ref 0–44)
AST: 25 U/L (ref 15–41)
Albumin: 3.3 g/dL — ABNORMAL LOW (ref 3.5–5.0)
Alkaline Phosphatase: 67 U/L (ref 38–126)
Anion gap: 10 (ref 5–15)
BUN: 19 mg/dL (ref 8–23)
CHLORIDE: 99 mmol/L (ref 98–111)
CO2: 27 mmol/L (ref 22–32)
CREATININE: 1.25 mg/dL — AB (ref 0.44–1.00)
Calcium: 8.8 mg/dL — ABNORMAL LOW (ref 8.9–10.3)
GFR calc Af Amer: 46 mL/min — ABNORMAL LOW (ref >60)
GFR calc non Af Amer: 40 mL/min — ABNORMAL LOW (ref >60)
Glucose, Bld: 110 mg/dL — ABNORMAL HIGH (ref 70–99)
Potassium: 4.5 mmol/L (ref 3.5–5.1)
Sodium: 136 mmol/L (ref 135–145)
Total Bilirubin: 0.6 mg/dL (ref 0.3–1.2)
Total Protein: 7 g/dL (ref 6.5–8.1)

## 2018-07-12 LAB — TYPE AND SCREEN
ABO/RH(D): O POS
ANTIBODY SCREEN: NEGATIVE
UNIT DIVISION: 0
Unit division: 0

## 2018-07-12 LAB — CBC
HCT: 39.7 % (ref 36.0–46.0)
HEMOGLOBIN: 12.3 g/dL (ref 12.0–15.0)
MCH: 29.9 pg (ref 26.0–34.0)
MCHC: 31 g/dL (ref 30.0–36.0)
MCV: 96.4 fL (ref 80.0–100.0)
NRBC: 0 % (ref 0.0–0.2)
PLATELETS: 199 10*3/uL (ref 150–400)
RBC: 4.12 MIL/uL (ref 3.87–5.11)
RDW: 14.1 % (ref 11.5–15.5)
WBC: 8.4 10*3/uL (ref 4.0–10.5)

## 2018-07-12 LAB — BPAM RBC
BLOOD PRODUCT EXPIRATION DATE: 201912062359
BLOOD PRODUCT EXPIRATION DATE: 201912062359
UNIT TYPE AND RH: 5100
Unit Type and Rh: 5100

## 2018-07-12 MED ORDER — ACETAMINOPHEN 325 MG PO TABS
650.0000 mg | ORAL_TABLET | ORAL | Status: DC | PRN
  Administered 2018-07-12 – 2018-07-13 (×3): 650 mg via ORAL
  Filled 2018-07-12 (×4): qty 2

## 2018-07-12 MED ORDER — SODIUM CHLORIDE 0.9 % IV BOLUS
250.0000 mL | Freq: Once | INTRAVENOUS | Status: AC
  Administered 2018-07-12: 250 mL via INTRAVENOUS

## 2018-07-12 NOTE — Progress Notes (Signed)
Patient ID: Jenny Cooper, female   DOB: 1936-07-01, 82 y.o.   MRN: [id number] Did well overnight.  No issues.  Eating well.  Notshort of breath.  VSS, Afeb  Lungs clear Heart reg No air leak Serous drainage from tube  Will check cxray and labs Discontinue IV fluids Walk with assistance  Teach family on PleurX catheter management

## 2018-07-12 NOTE — Consult Note (Addendum)
Cardiology Consultation:   Patient ID: Jenny Cooper MRN: [id number]; DOB: 04-25-1936  Admit date: 07/11/2018 Date of Consult: 07/12/2018  Primary Care Provider: Glean Hess, MD Primary Cardiologist: [facility], Dr. Rockey Situ Primary Electrophysiologist:  [facility], Dr. Glennon Mac   Patient Profile:   Jenny Cooper is a 82 y.o. female with a hx of CAD/ICM s/p remote h/o MI (2002) with 2v CABG post PCI 2002, s/p MV repair 2/2 MR (2002), HFrEF (EF 30-35%, 2018) PAF s/p TEE/DCCV (2013), s/p MDT PPM (2013), atypical atrial flutter s/p ablation (07/27/2013), 2016 LHC with 3v disease with pLAD, occluded RCA, diffusely diseased LCx and recommendation for medical therapy, Afib and typical atrial flutter ablation (05/10/2016) at [facility], 2018 DCCV,  HTN, HLD, DM2, h/o blood clots with allergy reported to Lovenox, frequent falls s/p hip fracture, previous smoker (quit 2001, 40 pk year), acute respiratory failure with hypoxia, COPD, R lung carcinoma s/p chemo /radiation, GERD, hypothyroid, and chronic back pain with collapsed vertebrae who is being seen today for the evaluation of post-operative heart failure / anticoagulation at the request of Dr. Genevive Bi.  History of Present Illness:   [name] is an 82 yo female with PMH as above, known to [facility] and followed by Dr. Rockey Situ with most recent visit 03/2018. She is documented as a previous smoker and drinks 10 drinks / week. No reported illicit drug use. Family history includes hypertension in brother and sister as well as stroke in maternal grandmother.  CV studies/intervention: --2002: MI, CABG, PCI, MV repair --04/24/2012: PPM implanted Medronic Adapta [id number], Medronic [id number] atrial and ventricular leads both implanted 10/12/2000 04/28/2015: LHC as in CV studies below. Severe 3c CAD with patent SVG -LAD and occluded SVG likely to OM. RCA with severe in-stent restenosis and distal occlusion at previously placed stents, L-R collaterals; EF 35-40%. Medical  therapy recommended for CAD, ICM. --04/13/2017: Cardiac event monitor with persistent atrial fib/flutter and rare PVCs  Last seen by Dr. Rockey Situ: 8/12 for asymptomatic paroxysmal atrial fibrillation and flutter s/p PPM, DCCV and ablation also on anticoagulation and taking amiodarone with close follow-up by [facility] by Dr. Glennon Mac. At that time, her main complaint was of SOB on home O2.  She had also noted as recently started on Lipitor with c/o myalgia. She was noted as compliant with eliquis. EKG showed ventricular rate paced complexes, atrial paced complexes, and baseline artifact.  Plan was for medical management with amiodarone 259m po qd, Eliquis 561mpo qd, Entresto 24-267mID, Lasix 45m17mD, Toprol 25mg31msuvastatin 5mg. 5m019: PPM with last check 01/25/18. No atrial or venticular arrhythmias.  06/2018: She was reportedly cleared by Dr. GollanRockey Situold of anticoagulation d/t need for R thorascopy and possible thoracotomy pleural bx with talc pleurodisis and possible pleurx cath. On 07/11/18, the procedure was successfully performed by Dr. Oaks wGenevive BiMedtronic PPM magnet used during surgery. Per Medtronic rep, no interrogation was necessary post operatively. Cardiology has been consulted for anticoagulation and HFrEF post-operatively.   Past Medical History:  Diagnosis Date  . Acute respiratory failure with hypoxia (HCC) [place]/2019  . Atypical atrial flutter (HCC)  [place]. s/p ablation 07/27/2013 followed by Dr. GollanRockey SituD (coronary artery disease)    a. s/p MI x 2 in 2002 s/p PCI x 2 in 2002; b. s/p 2v CABG 2002; c. stress echo 07/2004 w/ evi of pos & inf infarct & no evi of ischemia; d. 4/08 dipyridamole scan w/ multiple areas of infarct, no ischemia, EF 49%;  e. cath 04/28/15 3v CAD, med Rx rec, no targets for revasc, LM lum irregs, pLAD 30%, 100%, ost-pLCx 60%, [name] 99%, OM2 100%, p-mRCA 90%, [name] 100% L-R collats, VG-mLAD irregs, VG-OM2 oc  . Carcinoma of right lung ([place]) 01/03/2015   a.  followed by Dr. Oliva Bustard  . Chronic systolic CHF (congestive heart failure) ([place])    a. echo 03/2015: EF 30-35%, sev ant/inf/pos HK, in mild to mod MR  . Complication of anesthesia    more recently patient oxygen levels do not rebound as quickly  . Compressed spine fracture ([place]) 08/06/2016   lumbar 2, t11, t12  . COPD (chronic obstructive pulmonary disease) ([place])   . Fractured pelvis ([place]) 05/26/2016   2 places  . GERD (gastroesophageal reflux disease)   . History of blood clots    12/2001 left leg  . History of colonoscopy 2013  . History of mammography, screening 2015  . History of Papanicolaou smear of cervix 2013  . HLD (hyperlipidemia)   . HTN (hypertension)   . Hypothyroidism   . Lung cancer ([place])   . Mitral regurgitation    a. s/p mitral ring placement 09/2000; b. echo 09/2010: EF 50%, inf HK, post AK, mild MR, prosthetic mitral valve ring w/ peak gradient of 10 mmHg; b. echo 2/13: EF 50%, mild MR/TR     . Myocardial infarction ([place])    X 2 (LAST ONE IN 2002)  . Neuropathy   . Pacemaker    a. MDT 2002; b. generator replacement 2013; c. followed by Dr. Omelia Blackwater, MD  . PAF (paroxysmal atrial fibrillation) ([place])    a. on Eliquis   . Personal history of chemotherapy   . Personal history of radiation therapy     Past Surgical History:  Procedure Laterality Date  . ABLATION  04/2016   [facility]  . APPENDECTOMY    . CARDIAC CATHETERIZATION N/A 04/28/2015   Procedure: Left Heart Cath and Coronary Angiography;  Surgeon: Wellington Hampshire, MD;  Location: [place] [facility];  Service: Cardiovascular;  Laterality: N/A;  . CARDIOVERSION N/A 04/08/2017   Procedure: CARDIOVERSION;  Surgeon: Wellington Hampshire, MD;  Location: [facility];  Service: Cardiovascular;  Laterality: N/A;  . CHEST TUBE INSERTION N/A 07/11/2018   Procedure: PLEURX CATH INSERTION;  Surgeon: [name], MD;  Location: [facility];  Service: Thoracic;  Laterality: N/A;  . COLONOSCOPY  12/2009   2 small tubular adenomas  .  CORONARY ARTERY BYPASS GRAFT  09/2000  . ECTOPIC PREGNANCY SURGERY    . ELECTROMAGNETIC NAVIGATION BROCHOSCOPY N/A 10/06/2015   Procedure: ELECTROMAGNETIC NAVIGATION BRONCHOSCOPY;  Surgeon: Flora Lipps, MD;  Location: [facility];  Service: Cardiopulmonary;  Laterality: N/A;  . ENDOBRONCHIAL ULTRASOUND N/A 10/06/2015   Procedure: ENDOBRONCHIAL ULTRASOUND;  Surgeon: Flora Lipps, MD;  Location: [facility];  Service: Cardiopulmonary;  Laterality: N/A;  . FLEXIBLE BRONCHOSCOPY Right 07/11/2018   Procedure: FLEXIBLE BRONCHOSCOPY;  Surgeon: [name], MD;  Location: [facility];  Service: Thoracic;  Laterality: Right;  . KYPHOPLASTY N/A 09/28/2016   Procedure: KYPHOPLASTY;  Surgeon: Hessie Knows, MD;  Location: [facility];  Service: Orthopedics;  Laterality: N/A;  . KYPHOPLASTY N/A 02/20/2018   Procedure: [id number];  Surgeon: Hessie Knows, MD;  Location: [facility];  Service: Orthopedics;  Laterality: N/A;  . PACEMAKER INSERTION  03/2012  . thorocentesis  12/24/2016  . VAGINAL HYSTERECTOMY     partial - left ovary remains  . VIDEO ASSISTED THORACOSCOPY Right 07/11/2018   Procedure: VIDEO ASSISTED THORACOSCOPY;  Surgeon: Genevive Bi,  Christia Reading, MD;  Location: [facility];  Service: Thoracic;  Laterality: Right;  Marland Kitchen VIDEO ASSISTED THORACOSCOPY (VATS) W/TALC PLEUADESIS Right 07/11/2018   Procedure: THORACOTOMY PLEURAL BIOPSY WITH TALC PLEURODESIS;  Surgeon: [name], MD;  Location: [facility];  Service: Thoracic;  Laterality: Right;  pleural biopsy     Home Medications:  Prior to Admission medications   Medication Sig Start Date Joanna Hall Date Taking? Authorizing Provider  acetaminophen (TYLENOL) 500 MG tablet Take 1,000 mg by mouth every 6 (six) hours as needed for mild pain or moderate pain.    Yes [provider]  amiodarone (PACERONE) 200 MG tablet Take 1 tablet (200 mg total) by mouth daily. 04/27/18  Yes Gollan, Kathlene November, MD  budesonide (PULMICORT) 0.5 MG/2ML nebulizer solution Take 2 mLs (0.5 mg total) by  nebulization 2 (two) times daily. 05/12/18 05/12/19 Yes Kasa, Maretta Bees, MD  Calcium Carb-Cholecalciferol (CALCIUM 600-D PO) Take 1 tablet by mouth every morning.    Yes [provider]  cetirizine (ZYRTEC) 10 MG tablet TAKE (1) TABLET BY MOUTH EVERY DAY Patient taking differently: Take 10 mg by mouth daily.  06/15/18  Yes Glean Hess, MD  ELIQUIS 5 MG TABS tablet TAKE (1) TABLET BY MOUTH TWICE DAILY Patient taking differently: Take 5 mg by mouth 2 (two) times daily.  02/13/18  Yes Gollan, Kathlene November, MD  ENTRESTO 24-26 MG TAKE (1) TABLET BY MOUTH TWICE DAILY Patient taking differently: Take 1 tablet by mouth 2 (two) times daily.  06/19/18  Yes Gollan, Kathlene November, MD  furosemide (LASIX) 40 MG tablet TAKE (1) TABLET BY MOUTH TWICE DAILY Patient taking differently: Take 40 mg by mouth 2 (two) times daily. Does not take on Sundays 06/15/18  Yes Gollan, Kathlene November, MD  levothyroxine (SYNTHROID, LEVOTHROID) 88 MCG tablet Take 1 tablet (88 mcg total) by mouth daily before breakfast. 12/20/17  Yes Glean Hess, MD  metoprolol succinate (TOPROL-XL) 25 MG 24 hr tablet TAKE ONE TABLET BY MOUTH EVERY MORNING AND TAKE TWO TABLETS BY MOUTH EVERY EVENING Patient taking differently: Take 25-50 mg by mouth See admin instructions. Take 25 mg by mouth in the morning and take 25 mg by mouth in the evening. Except do NOT take any on Sunday. 05/15/18  Yes Gollan, Kathlene November, MD  Multiple Vitamins-Minerals (CENTRUM SILVER PO) Take 1 tablet by mouth daily.   Yes [provider]  potassium chloride SA (K-DUR,KLOR-CON) 20 MEQ tablet Take 1 tablet (20 mEq total) by mouth daily. 06/13/18  Yes Cammie Sickle, MD  Revefenacin (YUPELRI) 175 MCG/3ML SOLN Inhale 3 mLs into the lungs daily. DX: J76.77 05/12/18  Yes Flora Lipps, MD  rosuvastatin (CRESTOR) 5 MG tablet Take 1 tablet (5 mg total) by mouth daily. 10/19/17  Yes Minna Merritts, MD  senna (SENOKOT) 8.6 MG TABS tablet Take 1 tablet (8.6 mg total)  by mouth daily. Patient taking differently: Take 1 tablet by mouth 2 (two) times daily.  05/27/16  Yes Alfred Levins, [place], MD  OXYGEN Inhale 2 L into the lungs. continuous    [provider]    Inpatient Medications: Scheduled Meds: . amiodarone  200 mg Oral Daily  . bisacodyl  10 mg Oral Daily  . budesonide  0.5 mg Nebulization BID  . furosemide  40 mg Oral BID  . levothyroxine  88 mcg Oral Q0600  . metoprolol succinate  25-50 mg Oral See admin instructions  . potassium chloride SA  20 mEq Oral Daily  . Revefenacin  3 mL  Inhalation Daily  . rosuvastatin  5 mg Oral Daily  . sacubitril-valsartan  1 tablet Oral BID   Continuous Infusions: . dextrose 5 % and 0.45% NaCl 50 mL/hr at 07/12/18 0017   PRN Meds: acetaminophen, ondansetron (ZOFRAN) IV, oxyCODONE  Allergies:    Allergies  Allergen Reactions  . Lovenox [Enoxaparin Sodium] Itching    Social History:   Social History   Socioeconomic History  . Marital status: Widowed    Spouse name: Not on file  . Number of children: 6  . Years of education: Not on file  . Highest education level: Some college, no degree  Occupational History  . Occupation: Retired  Scientific laboratory technician  . Financial resource strain: Somewhat hard  . Food insecurity:    Worry: Never true    Inability: Never true  . Transportation needs:    Medical: No    Non-medical: No  Tobacco Use  . Smoking status: Former Smoker    Packs/day: 1.00    Years: 40.00    Pack years: 40.00    Types: Cigarettes    Last attempt to quit: 08/30/2000    Years since quitting: 17.8  . Smokeless tobacco: Never Used  . Tobacco comment: quit smoking in 08/28/2000. Smoking cessation materials not required  Substance and Sexual Activity  . Alcohol use: Yes    Alcohol/week: 10.0 standard drinks    Types: 10 Glasses of wine per week    Comment: 2 glasses of wine per day  . Drug use: No  . Sexual activity: Never  Lifestyle  . Physical activity:    Days per week: 3  days    Minutes per session: 50 min  . Stress: Not at all  Relationships  . Social connections:    Talks on phone: More than three times a week    Gets together: Once a week    Attends religious service: Never    Active member of club or organization: No    Attends meetings of clubs or organizations: Never    Relationship status: Widowed  . Intimate partner violence:    Fear of current or ex partner: No    Emotionally abused: No    Physically abused: No    Forced sexual activity: No  Other Topics Concern  . Not on file  Social History Narrative  . Not on file    Family History:    Family History  Problem Relation Age of Onset  . COPD Mother        sister, and brother  . Lung disease Father   . Stroke Maternal Grandmother   . Hypertension Sister   . COPD Sister   . Hypertension Brother   . COPD Brother   . Colon cancer Brother   . Heart attack Neg Hx   . Breast cancer Neg Hx      ROS:  Please see the history of present illness.   All other ROS reviewed and negative.     Physical Exam/Data:   Vitals:   07/12/18 0400 07/12/18 0500 07/12/18 0600 07/12/18 0800  BP: (!) 119/58 (!) 134/95 (!) 98/56   Pulse: (!) 58 60 (!) 59   Resp: 15 12 (!) 22   Temp: 97.6 F (36.4 C)   (!) 97 F (36.1 C)  TempSrc: Oral   Axillary  SpO2: 94% 96% 96%   Weight:      Height:        Intake/Output Summary (Last 24 hours) at 07/12/2018 0842 Last  data filed at 07/12/2018 0600 Gross per 24 hour  Intake 1668.43 ml  Output 1100 ml  Net 568.43 ml   Filed Weights   07/11/18 1154  Weight: 70.5 kg   Body mass index is 25.09 kg/m.  General:  Frail elderly woman, in no acute distress HEENT: normal Lymph: no adenopathy Neck: no JVD Endocrine:  No thryomegaly Vascular: No carotid bruits  Cardiac:  Atrial and ventricular paced rhythm Lungs:  clear to auscultation bilaterally, no wheezing, rhonchi or rales. Normal respiratory effort  Abd: soft, nontender, no hepatomegaly  Ext:  no edema Musculoskeletal:  No deformities, BUE and BLE strength normal and equal Skin: warm and dry  Neuro:  no focal abnormalities noted Psych:  Normal affect   EKG: Refer to HPI Telemetry:  Telemetry was personally reviewed and demonstrates:  Paced rhythm with HR 60  CV Studies:   Relevant CV Studies:  2017-04-12  TTE Study Conclusions - Left ventricle: The cavity size was normal. There was mild   concentric hypertrophy. Systolic function was moderately to   severely reduced. The estimated ejection fraction was in the   range of 30% to 35%. Severe hypokinesis of the anteroseptal and   anterior myocardium. The study is not technically sufficient to   allow evaluation of LV diastolic function. - Aortic valve: There was trivial regurgitation. - Mitral valve: Calcified annulus. Prior procedures included   surgical repair. An annular ring prosthesis was present. There   was moderate regurgitation. Mean gradient (D): 4 mm Hg. - Left atrium: The atrium was mildly dilated. - Pulmonary arteries: Systolic pressure was moderately increased.   PA peak pressure: 50 mm Hg (S). - Inferior vena cava: The vessel was dilated. The respirophasic   diameter changes were blunted (< 50%), consistent with elevated   central venous pressure.  04/13/2017: Event Monitor Persistent atrial fib/flutter Rare PVCs  04/28/2015: LHC  Prox LAD-2 lesion, 100% stenosed.  Prox LAD-1 lesion, 30% stenosed.  Ost Cx to Prox Cx lesion, 60% stenosed.  Mid Cx lesion, 99% stenosed. The lesion was previously treated with a stent (unknown type) greater than two years ago.  Ost 2nd Mrg to 2nd Mrg lesion, 100% stenosed.  SVG was injected .  Origin lesion, 100% stenosed.  Prox RCA to Mid RCA lesion, 90% stenosed. The lesion was previously treated with a stent (unknown type) greater than two years ago.  Mid RCA to Dist RCA lesion, 100% stenosed. The lesion was previously treated with a stent (unknown type)  .  SVG was injected .  The graft exhibits minimal luminal irregularities.  The left ventricular systolic function is normal. 1. Severe underlying three-vessel coronary artery disease with patent SVG to LAD. Occluded SVG likely to OM. The native RCA has severe in-stent restenosis in the midsegment and is occluded distally at the sites of previously placed stents. There are left to right collaterals. 2. Moderately reduced LV systolic function with an ejection fraction of 35-40%. 3. Mildly dilated left ventricular Michole Lecuyer-diastolic pressure. Recommendations: There are no good options for revascularization. The native RCA is chronically occluded with left-to-right collaterals. The native left circumflex has diffuse disease and overall small in caliber. I recommend optimizing medical therapy for CAD and ischemic cardiomyopathy. I discontinued aspirin given that the patient is on anticoagulation.   Laboratory Data:  Chemistry Recent Labs  Lab 07/06/18 1551  NA 141  K 4.2  CL 99  CO2 32  GLUCOSE 96  BUN 20  CREATININE 1.36*  CALCIUM 9.3  GFRNONAA  36*  GFRAA 41*  ANIONGAP 10    Recent Labs  Lab 07/06/18 1551  PROT 7.9  ALBUMIN 3.8  AST 35  ALT 19  ALKPHOS 81  BILITOT 0.8   Hematology Recent Labs  Lab 07/06/18 1551  WBC 3.7*  RBC 4.44  HGB 13.3  HCT 42.8  MCV 96.4  MCH 30.0  MCHC 31.1  RDW 14.3  PLT 218   Cardiac EnzymesNo results for input(s): TROPONINI in the last 168 hours. No results for input(s): TROPIPOC in the last 168 hours.  BNPNo results for input(s): BNP, PROBNP in the last 168 hours.  DDimer No results for input(s): DDIMER in the last 168 hours.  Radiology/Studies:  Dg Chest Port 1 View  Result Date: 07/12/2018 CLINICAL DATA:  Status post chest tube placement yesterday EXAM: PORTABLE CHEST 1 VIEW COMPARISON:  07/11/2018 FINDINGS: Cardiac shadow is stable. Postsurgical changes are again seen. Pacing device and right chest wall port are again noted and  stable. The left lung is clear with the exception of minimal left basilar atelectasis. Some linear density (likely atelectasis) is again seen in the right mid lung stable from the prior study. Right chest tube and PleurX catheter have been placed. Previously seen right apical pneumothorax is again identified and slightly smaller when compared with the prior exam. No bony abnormality is noted. IMPRESSION: Tubes and lines as described above. Tiny right pneumothorax is again identified but improved. Stable atelectatic changes in the right perihilar region as well as new atelectasis in the right lung base. Electronically Signed   By: Inez Catalina M.D.   On: 07/12/2018 08:18   Dg Chest Port 1 View  Result Date: 07/11/2018 CLINICAL DATA:  Postop, follow-up EXAM: PORTABLE CHEST 1 VIEW COMPARISON:  Chest x-ray of 07/06/2017 FINDINGS: Aeration of the lungs has improved somewhat. Linear opacity in the right mid lung is unchanged most consistent with atelectasis. No pleural effusion is seen. Mild cardiomegaly stable and mitral valve replacement is noted. Median sternotomy sutures are present. A right chest tube is present and the small effusion has largely resolved. There is a small right apical pneumothorax now present. Right-sided Port-A-Cath tip overlies the lower SVC and permanent pacemaker remains. IMPRESSION: 1. Right chest tube is present. There is a small right apical pneumothorax present of 10% or less. 2. Somewhat improved aeration of the right lung base with no present effusion seen. 3. Stable cardiomegaly with permanent pacemaker. Electronically Signed   By: Ivar Drape M.D.   On: 07/11/2018 10:11    Assessment and Plan:   1. Afib/Aflutter s/p PPM, ablation, DCCV  - Telemetry shows paced rhythm with HR 60 - CHA2DS2-VASc risk ~ 6 with recommendation for anticoagulation d/t annual  risk of stroke and thromboembolism  - S/p surgery - Eliquis 13m BID held as above in HPI. Recommend restart of  anticoagulation per Dr. OGenevive Bionce medically safe to do so.  - Continue amiodarone. Continue to monitor on telemetry.  2. HFrEF (EF 30-35%) - CV studies as above - Euvolemic on exam - Continue Entresto 24-255mBID, KCl supplementation with 2010m 3. CAD / ICM - 2016 LHC as above - Continue medical management with Toprol, statin, Entresto.     4. HTN - Controlled with BP 105/58 - Continue Toprol 25-50 - Continue to monitor vitals  5. HLD - Liver function stable, LDL 63 and at goal 07/2017. Consider updating LDL as outpatient.  - Continue Crestor 5mg42m. Renal insufficiency - Cr 1.36 and appears  at baseline for 2019, consistent with chronic kidney disease stage III - Continue to monitor and renally dose medications if appropriate   7. Remainder per IM/Crit Care    For questions or updates, please contact [place] Please consult [email or web address] for contact info under     Signed, Arvil Chaco, PA-C  07/12/2018 8:42 AM

## 2018-07-13 ENCOUNTER — Inpatient Hospital Stay: Payer: Medicare Other

## 2018-07-13 LAB — CYTOLOGY - NON PAP

## 2018-07-13 MED ORDER — OXYCODONE HCL 5 MG PO TABS: 5.0000 mg | ORAL_TABLET | ORAL | 0 refills | Status: DC | PRN

## 2018-07-13 NOTE — Discharge Instructions (Signed)
Due to low blood pressure the following changes were made to your heart medications:  HOLD (do not take) Lasix  HOLD (do not take) Potassium Pill  HOLD (do not take) Entresto  If you notice significant swelling, it is okay to re-start lasix, at the same time re-start your potassium    Indwelling Pleural Catheter Home Guide An indwelling pleural catheter is a thin, flexible tube that is inserted under your skin and into your chest. The catheter drains excess fluid that collects in the area between the chest wall and the lungs (pleural space).After the catheter is inserted, it can be attached to a bottle that collects fluid. The pleural catheter will allow you to drain fluid from your chest at home on a regular basis (sometimes daily). This will eliminate the need for frequent visits to the hospital or clinic to drain the fluid. The catheter may be removed after the excess fluid problem is resolved, usually after 2-3 months. It is important to follow instructions from your health care provider about how to drain and care for your catheter. What are the risks? Generally, this is a safe procedure. However, problems may occur, including:  Infection.  Skin damage around the catheter.  Lung damage.  Failure of the chest tube to work properly.  Spreading of cancer cells along the catheter, if you have cancer.  Supplies needed:  Vacuum-sealed drainage bottle with attached drainage line.  Sterile dressing.  Sterile alcohol pads.  Sterile gloves.  Valve cap.  Sterile gauze pads, 4  4 inch (10 cm  10 cm).  Tape.  Adhesive dressing.  Sterile foam catheter pad. How to care for your catheter and insertion site  Wash your hands with soap and warm water before and after touching the catheter or insertion site. If soap and water are not available, use hand sanitizer.  Check your bandage (dressing) daily to make sure it is clean and dry.  Keep the skin around the catheter clean  and dry.  Check the catheter regularly for any cracks or kinks in the tubing.  Check your catheter insertion site every day for signs of infection. Check for: ? Skin breakdown. ? Redness, swelling, or pain. ? Fluid or blood. ? Warmth. ? Pus or a bad smell. How to drain your catheter You may need to drain your catheter every day, or more or less often as told by your health care provider. Follow instructions from your health care provider about how to drain your catheter. You may also refer to instructions that come with the drainage system. To drain the catheter: 1. Wash your hands with soap and warm water. If soap and water are not available, use hand sanitizer. 2. Carefully remove the dressing from around the catheter. 3. Wash your hands again. 4. Put on the gloves provided. 5. Prepare the vacuum-sealed drainage bottle and drainage line. Close the drainage line of the vacuum-sealed drainage bottle by squeezing the pinch clamp or rolling the wheel of the roller clamp toward the bottle. The vacuum in the bottle will be lost if the line is not closed completely. 6. Remove the access tip cover from the drainage line. Do not touch the end. Set it on a sterile surface. 7. Remove the catheter valve cap and throw it away. 8. Use an alcohol pad to clean the end of the catheter. 9. Insert the access tip into the catheter valve. Make sure the valve and access tip are securely connected. Listen for a click to confirm that  they are connected. 10. Insert the T plunger to break the vacuum seal on the drainage bottle. 11. Open the clamp on the drainage line. 12. Allow the catheter to drain. Keep the catheter and the drainage bottle below the level of your chest. There may be a one-way valve on the end of the tubing that will allow liquid and air to flow out of the catheter without letting air inside. 13. Drain the amount of fluid as told by your health care provider. It usually takes 5-15 minutes. Do not  drain more than 1000 mL of fluid. You may feel a little discomfort while you are draining. If the pain is severe, stop draining and contact your health care provider. 14. After you finish draining the catheter, remove the drainage bottle tubing from the catheter. 15. Use a clean alcohol pad to wipe the catheter tip. 16. Place a clean cap on the end of the catheter. 17. Use an alcohol pad to clean the skin around the catheter. 18. Allow the skin to air-dry. 19. Put the catheter pad on your skin. Curl the catheter into loops and place it on the pad. Do not place the catheter on your skin. 20. Replace the dressing over the catheter. 21. Discard the drainage bottle as instructed by your health care provider. Do not reuse the drainage bottle.  How to change your dressing Change your dressing at least once a week, or more often if needed to keep the dressing dry. Be sure to change the dressing whenever it becomes moist. Your health care provider will tell you how often to change your dressing. 1. Wash your hands with soap and warm water. If soap and water are not available, use hand sanitizer. 2. Gently remove the old dressing. Avoid using scissors to remove the dressing. Sharp objects may damage the catheter. 3. Wash the skin around the insertion site with mild, fragrance-free soap and warm water. Rinse well, then pat the area dry with a clean cloth. 4. Check the skin around the catheter for signs of infection. Check for: ? Skin breakdown. ? Redness, swelling, or pain. ? Fluid or blood. ? Warmth. ? Pus or a bad smell. 5. If your catheter was stitched (sutured) to your skin, look at the suture to make sure it is still anchored in your skin. 6. Do not apply creams, ointments, or alcohol to the area. Let your skin air-dry completely before you apply a new dressing. 7. Curl the catheter into loops and place it on the sterile catheter pad. Do not place the catheter on your skin. 8. If you do not have a  pad, use a clean dressing. Slide the dressing under the disk that holds the drainage catheter in place. 9. Use gauze to cover the catheter and the catheter pad. The catheter should rest on the pad or dressing, not on your skin. 10. Tape the dressing to your skin. You may be instructed to use an adhesive dressing covering instead of gauze and tape. 11. Wash your hands with soap and warm water. If soap and water are not available, use hand sanitizer.  General recommendations  Always wash your hands with soap and warm water before and after caring for your catheter and drainage bottle. Use a mild, fragrance-free soap. If soap and water are not available, use hand sanitizer.  Always make sure there are no leaks in the catheter or drainage bottle.  Each time you drain the catheter, note the color and amount of fluid.  Do not touch the tip of the catheter or the drainage bottle tubing.  Do not reuse drainage bottles.  Do not take baths, swim, or use a hot tub until your health care provider approves. Ask your health care provider if you may take showers. You may only be allowed to take sponge baths.  Take deep breaths regularly, followed by a cough. Doing this can help to prevent lung infection. Contact a health care provider if:  You have any questions about caring for your catheter or drainage bottle.  You still have pain at the catheter insertion site more than 2 days after your procedure.  You have pain while draining your catheter.  Your catheter becomes bent, twisted, or cracked.  The connection between the catheter and the collection bottle becomes loose.  You have any of these around your catheter insertion site or coming from it: ? Skin breakdown. ? Redness, swelling, or pain. ? Fluid or blood. ? Warmth. ? Pus or a bad smell. Get help right away if:  You have a fever or chills.  You have chest pain.  You have dizziness or shortness of breath.  You have severe redness,  swelling, or pain at your catheter insertion site.  The catheter comes out.  The catheter is blocked or clogged. Summary  An indwelling pleural catheter is a thin, flexible tube that is inserted under your skin and into your chest. The catheter drains excess fluid that collects in the area between the chest wall and the lungs (pleural space).  It is important to follow instructions from your health care provider about how to drain and care for your catheter.  Do not touch the tip of the catheter or the drainage bottle tubing.  Always wash your hands with soap and water before and after caring for your catheter and drainage bottle. If soap and water are not available, use hand sanitizer. This information is not intended to replace advice given to you by your health care provider. Make sure you discuss any questions you have with your health care provider. Document Released: 12/09/2016 Document Revised: 12/09/2016 Document Reviewed: 12/09/2016 Elsevier Interactive Patient Education  [employer].

## 2018-07-13 NOTE — Progress Notes (Signed)
Progress Note  Patient Name: Jenny Cooper Date of Encounter: 07/13/2018  Primary Cardiologist: Dr. Rockey Situ  Subjective   No reported chest pain, palpitations, or feeling as if her heart is racing. Joined by her daughter. Both are very excited that patient had her chest tube removed. She reportedly just ate lunch and had a healthy appetite.  Per Dr. Genevive Bi, anticoagulation restarted today.   I/O -120, Wt 70.5kg HR 61, BP 95/63 (SBP in 80s overnight), SpO2 92% on [place] Cr 1.36  1.25, K 4.2  4.5  Recommendations as below.  Inpatient Medications    Scheduled Meds: . amiodarone  200 mg Oral Daily  . bisacodyl  10 mg Oral Daily  . budesonide  0.5 mg Nebulization BID  . furosemide  40 mg Oral BID  . levothyroxine  88 mcg Oral Q0600  . metoprolol succinate  25-50 mg Oral See admin instructions  . potassium chloride SA  20 mEq Oral Daily  . Revefenacin  3 mL Inhalation Daily  . rosuvastatin  5 mg Oral Daily   Continuous Infusions:  PRN Meds: acetaminophen, ondansetron (ZOFRAN) IV, oxyCODONE   Vital Signs    Vitals:   07/12/18 2030 07/13/18 0440 07/13/18 1009 07/13/18 1208  BP: (!) 88/47 (!) 93/53 (!) 94/55 95/63  Pulse: 60 (!) 57 (!) 59 61  Resp:  17  20  Temp:  98.1 F (36.7 C)  (!) 97.3 F (36.3 C)  TempSrc:    Oral  SpO2:  94%  92%  Weight:      Height:        Intake/Output Summary (Last 24 hours) at 07/13/2018 1255 Last data filed at 07/13/2018 0536 Gross per 24 hour  Intake -  Output 360 ml  Net -360 ml   Filed Weights   07/11/18 1154  Weight: 70.5 kg    Telemetry    A paced rhythm - Personally Reviewed  ECG    No new tracings- Personally Reviewed  Physical Exam   GEN: No acute distress.  Joined by daughter. Just ate lunch Neck: No JVD. Port present on right side of neck Cardiac: PPM and RRR, no murmurs, rubs, or gallops.  Respiratory: Clear to auscultation bilaterally. No crackles GI: Soft, nontender, non-distended  MS: No edema; No  deformity. Neuro:  Nonfocal  Psych: Normal affect   Labs    Chemistry Recent Labs  Lab 07/06/18 1551 07/12/18 1444  NA 141 136  K 4.2 4.5  CL 99 99  CO2 32 27  GLUCOSE 96 110*  BUN 20 19  CREATININE 1.36* 1.25*  CALCIUM 9.3 8.8*  PROT 7.9 7.0  ALBUMIN 3.8 3.3*  AST 35 25  ALT 19 12  ALKPHOS 81 67  BILITOT 0.8 0.6  GFRNONAA 36* 40*  GFRAA 41* 46*  ANIONGAP 10 10     Hematology Recent Labs  Lab 07/06/18 1551 07/12/18 1444  WBC 3.7* 8.4  RBC 4.44 4.12  HGB 13.3 12.3  HCT 42.8 39.7  MCV 96.4 96.4  MCH 30.0 29.9  MCHC 31.1 31.0  RDW 14.3 14.1  PLT 218 199    Cardiac EnzymesNo results for input(s): TROPONINI in the last 168 hours. No results for input(s): TROPIPOC in the last 168 hours.   BNPNo results for input(s): BNP, PROBNP in the last 168 hours.   DDimer No results for input(s): DDIMER in the last 168 hours.   Radiology    Dg Chest 2 View  Result Date: 07/13/2018 CLINICAL DATA:  Postop right  side chest tube EXAM: CHEST - 2 VIEW COMPARISON:  07/12/2018 FINDINGS: Support devices including left pacer and right Port-A-Cath are unchanged. Severe COPD. Prior median sternotomy and valve replacement. Areas of scarring or atelectasis in the right upper lobe and both lung bases. Right chest tube is in place. Small right apical pneumothorax is stable. IMPRESSION: Right chest tube remains in place with small residual right apical pneumothorax, stable. COPD. Areas of atelectasis or scarring in the right upper lobe and both lung bases. Electronically Signed   By: Rolm Baptise M.D.   On: 07/13/2018 09:27   Dg Chest Port 1 View  Result Date: 07/12/2018 CLINICAL DATA:  Status post chest tube placement yesterday EXAM: PORTABLE CHEST 1 VIEW COMPARISON:  07/11/2018 FINDINGS: Cardiac shadow is stable. Postsurgical changes are again seen. Pacing device and right chest wall port are again noted and stable. The left lung is clear with the exception of minimal left basilar  atelectasis. Some linear density (likely atelectasis) is again seen in the right mid lung stable from the prior study. Right chest tube and PleurX catheter have been placed. Previously seen right apical pneumothorax is again identified and slightly smaller when compared with the prior exam. No bony abnormality is noted. IMPRESSION: Tubes and lines as described above. Tiny right pneumothorax is again identified but improved. Stable atelectatic changes in the right perihilar region as well as new atelectasis in the right lung base. Electronically Signed   By: Inez Catalina M.D.   On: 07/12/2018 08:18    Cardiac Studies   04/06/2017  TTE Study Conclusions - Left ventricle: The cavity size was normal. There was mild concentric hypertrophy. Systolic function was moderately to severely reduced. The estimated ejection fraction was in the range of 30% to 35%. Severe hypokinesis of the anteroseptal and anterior myocardium. The study is not technically sufficient to allow evaluation of LV diastolic function. - Aortic valve: There was trivial regurgitation. - Mitral valve: Calcified annulus. Prior procedures included surgical repair. An annular ring prosthesis was present. There was moderate regurgitation. Mean gradient (D): 4 mm Hg. - Left atrium: The atrium was mildly dilated. - Pulmonary arteries: Systolic pressure was moderately increased. PA peak pressure: 50 mm Hg (S). - Inferior vena cava: The vessel was dilated. The respirophasic diameter changes were blunted (<50%), consistent with elevated central venous pressure.  04/13/2017: Event Monitor Persistent atrial fib/flutter Rare PVCs  04/28/2015: LHC  Prox LAD-2 lesion, 100% stenosed.  Prox LAD-1 lesion, 30% stenosed.  Ost Cx to Prox Cx lesion, 60% stenosed.  Mid Cx lesion, 99% stenosed. The lesion was previously treated with a stent (unknown type) greater than two years ago.  Ost 2nd Mrg to 2nd Mrg lesion, 100%  stenosed.  SVG was injected .  Origin lesion, 100% stenosed.  Prox RCA to Mid RCA lesion, 90% stenosed. The lesion was previously treated with a stent (unknown type) greater than two years ago.  Mid RCA to Dist RCA lesion, 100% stenosed. The lesion was previously treated with a stent (unknown type) .  SVG was injected .  The graft exhibits minimal luminal irregularities.  The left ventricular systolic function is normal. 1. Severe underlying three-vessel coronary artery disease with patent SVG to LAD. Occluded SVG likely to OM. The native RCA has severe in-stent restenosis in the midsegment and is occluded distally at the sites of previously placed stents. There are left to right collaterals. 2. Moderately reduced LV systolic function with an ejection fraction of 35-40%. 3. Mildly dilated left  ventricular end-diastolic pressure. Recommendations: There are no good options for revascularization. The native RCA is chronically occluded with left-to-right collaterals. The native left circumflex has diffuse disease and overall small in caliber. I recommend optimizing medical therapy for CAD and ischemic cardiomyopathy. I discontinued aspirin given that the patient is on anticoagulation.   Patient Profile     82 y.o. female with a hx of CAD/ICM s/p remote h/o MI (2002) with 2v CABG post PCI 2002, s/p MV repair 2/2 MR (2002), HFrEF (EF 30-35%, 2018) PAF s/p TEE/DCCV (2013), s/p MDT PPM (2013), atypical atrial flutter s/p ablation (07/27/2013), 2016 LHC with 3v disease with pLAD, occluded RCA, diffusely diseased LCx and recommendation for medical therapy, Afib and typical atrial flutter ablation (05/10/2016) at [facility], 2018 DCCV,  HTN, HLD, DM2, h/o blood clots with allergy reported to Lovenox, frequent falls s/p hip fracture, previous smoker (quit 2001, 40 pk year), acute respiratory failure with hypoxia, COPD, R lung carcinoma s/p chemo /radiation, GERD, hypothyroid, and chronic back pain with  collapsed vertebrae who is being seen today for the evaluation of post-operative heart failure / anticoagulation at the request of Dr. Genevive Bi.  Assessment & Plan    1.  Paroxysmal Afib/flutter  - H/o ablation, DCCV, MV repair, PPM with h/o SSS. CHA2DS2VASc score of at least 6 with recommendation for anticoagulation due to risk of stroke and thromboembolism.  - Telemetry shows A paced rhythm - Per Dr. Genevive Bi, anticoagulation restarted today and after R talc pleurodesis and pleurx catheter placement d/t recurrent pleural effusion. -Continue amiodarone 200mg  po qd, Eliquis 5mg  po bid  2. HFrEF (EF 30-35%) - CV studies as above - Discontinued lasix and potassium as hypotensive, euvolemic on exam, and recent labs show elevated potassium. If patient becomes SOB with signs and symptoms concerning for flash pulmonary edema, recommend restarting lasix and KCl tab at that time. If restart lasix and hold dose, recommend holding KCl supplementation as well to prevent elevated potassium in the future. - Continue to hold Entresto given hypotension with plan to reinitiate once BP recovers after surgery. Could also consider low dose losartan as an alternative and until safe to restart Entresto.   3. CAD / ICM s/p CABG and PCI - LHC as above - Medical management as above  4. HTN - Hypotensive  - Hold lasix and KCl supplementation  - Will recheck in AM   5. HLD - Liver function stable, LDL at goal. - Continue Crestor 5mg    6. Renal insufficiency - Cr improving 1.36  1.25 - K 4.2  4.5. Monitor.  - Continue to monitor renal function and electrolytes with daily BMET   For questions or updates, please contact [place] Please consult [email or web address] for contact info under     Signed, Arvil Chaco, PA-C  07/13/2018, 12:55 PM

## 2018-07-13 NOTE — Discharge Summary (Signed)
Discharge Summary  Patient ID: Jenny Cooper MRN: [id number] DOB/AGE: 1935-11-11 82 y.o.  Admit date: 07/11/2018 Discharge date: 07/13/2018  Discharge Diagnoses Patient Active Problem List   Diagnosis Date Noted  . Recurrent pleural effusion on right 07/11/2018  . Environmental and seasonal allergies 12/20/2017  . Muscle spasms of neck 12/20/2017  . Muscle ache 09/15/2017  . Coronary artery disease of bypass graft of native heart with stable angina pectoris ([place]) 04/21/2017  . Chronic systolic heart failure ([place]) 04/14/2017  . Sinus node dysfunction ([place]) 04/06/2017  . Back pain of thoracolumbar region 03/15/2017  . Paroxysmal atrial fibrillation ([place]) 03/05/2017  . Falls frequently 03/05/2017  . Pleural effusion 12/23/2016  . Cancer of hilus of right lung ([place]) 12/14/2016  . Compression fracture of L2 lumbar vertebra ([place]) 08/31/2016  . Acute midline low back pain without sciatica 08/13/2016  . Fracture of multiple pubic rami, right, closed, initial encounter ([place]) 05/30/2016  . Cardiomyopathy ([place]) 04/29/2016  . Type 2 diabetes mellitus with hemoglobin A1c goal of less than 7.0% ([place]) 11/17/2015  . Hx of adenomatous colonic polyps 11/17/2015  . Radiation pneumonitis ([place]) 10/21/2015  . Mitral regurgitation   . HLD (hyperlipidemia)   . Paroxysmal supraventricular tachycardia ([place])   . NSTEMI (non-ST elevated myocardial infarction) ([place])   . Arteriosclerosis of coronary artery 01/11/2015  . Hypothyroidism (acquired) 01/11/2015  . Disorder of peripheral nervous system 10/04/2014  . Cervical radiculopathy, chronic 10/04/2014  . Lung cancer ([place]) 10/04/2014  . Atrial flutter ([place]) 11/16/2013  . Chronic obstructive pulmonary disease ([place]) 04/24/2012  . Hypertension 08/03/2011  . Pacemaker 08/03/2011    Consultants Cardiology  Procedures  right thoracoscopy and pluerex catheter placement on 11/12  HPI: NADALYN Cooper is a 82 y.o. female who presents to [facility] on  07/11/18 for scheduled right thoracoscopy and pluerex catheter placement.   Hospital Course: Informed consent was obtained and documented, and patient underwent uneventful right thoracoscopy and pluerex catheter placement (Dr. Genevive Bi, MD, 07/11/2018).  Post-operatively, patient's pain and SOB improved/resolved and advancement of patient's diet, ambulation, and chest tube removal were well-tolerated. The remainder of patient's hospital course was essentially unremarkable, and discharge planning was initiated accordingly with patient safely able to be discharged home with appropriate discharge instructions, pain control, and outpatient follow-up after all of her and her family's questions were answered to their expressed satisfaction.     Allergies as of 07/13/2018      Reactions   Lovenox [enoxaparin Sodium] Itching      Medication List    STOP taking these medications   ENTRESTO 24-26 MG Generic drug:  sacubitril-valsartan   furosemide 40 MG tablet Commonly known as:  LASIX   potassium chloride SA 20 MEQ tablet Commonly known as:  K-DUR,KLOR-CON     TAKE these medications   acetaminophen 500 MG tablet Commonly known as:  TYLENOL Take 1,000 mg by mouth every 6 (six) hours as needed for mild pain or moderate pain.   amiodarone 200 MG tablet Commonly known as:  PACERONE Take 1 tablet (200 mg total) by mouth daily.   budesonide 0.5 MG/2ML nebulizer solution Commonly known as:  PULMICORT Take 2 mLs (0.5 mg total) by nebulization 2 (two) times daily.   CALCIUM 600-D PO Take 1 tablet by mouth every morning.   CENTRUM SILVER PO Take 1 tablet by mouth daily.   cetirizine 10 MG tablet Commonly known as:  ZYRTEC TAKE (1) TABLET BY MOUTH EVERY DAY What changed:  See the new instructions.  ELIQUIS 5 MG Tabs tablet Generic drug:  apixaban TAKE (1) TABLET BY MOUTH TWICE DAILY What changed:  See the new instructions.   levothyroxine 88 MCG tablet Commonly known as:  SYNTHROID,  LEVOTHROID Take 1 tablet (88 mcg total) by mouth daily before breakfast.   metoprolol succinate 25 MG 24 hr tablet Commonly known as:  TOPROL-XL TAKE ONE TABLET BY MOUTH EVERY MORNING AND TAKE TWO TABLETS BY MOUTH EVERY EVENING What changed:  See the new instructions.   oxyCODONE 5 MG immediate release tablet Commonly known as:  Oxy IR/ROXICODONE Take 1 tablet (5 mg total) by mouth every 4 (four) hours as needed for severe pain or breakthrough pain.   OXYGEN Inhale 2 L into the lungs. continuous   Revefenacin 175 MCG/3ML Soln Inhale 3 mLs into the lungs daily. DX: J76.77   rosuvastatin 5 MG tablet Commonly known as:  CRESTOR Take 1 tablet (5 mg total) by mouth daily.   senna 8.6 MG Tabs tablet Commonly known as:  SENOKOT Take 1 tablet (8.6 mg total) by mouth daily. What changed:  when to take this        Follow-up Information    [name], MD. Schedule an appointment as soon as possible for a visit on 07/21/2018.   Specialties:  Cardiothoracic Surgery, General Surgery Why:  s/p Right pleurodesis with Dr Domenic Moras information: [address] [place] [address] [phone]           Signed: Edison Simon , Hershal Coria [facility]  07/13/2018, 3:46 PM [phone] M-F: 7am - 4pm

## 2018-07-13 NOTE — Progress Notes (Signed)
Pt transition care home. All questions addressed. VS stable with pain being report by pt as "it's feeling better". Plurex supplies and educational tape sent with pt and family. Pt daughter providing transportation home. Dress over plurex tubing and where previous chest tube was inserted started leaking when pt. Put on bra. Dressing changed prior to leaving pt and pt daughter shown process.

## 2018-07-13 NOTE — Care Management Note (Signed)
Case Management Note  Patient Details  Name: MAIZIE GARNO MRN: [id number] Date of Birth: 20-Aug-1936  Subjective/Objective:  Admitted to [facility] with the diagnosis of pleural effusion. Son Remo Lipps lives in the home [phone]). Seen Dr,. Berglind last week. Prescriptions are filled at [facility] in [place].  No home Health. No skilled nursing. Home oxygen per [facility] x 1 year. Uses 2 liters continuous. No skilled nursing. Rolling walker, shower chair, in the home. Takes care of all basic activities of daily living herself, could drive, if needed.  Last fall was 2017. Decreased appetite. Lost 10-14 pounds the last 6 months.                  Action/Plan: Pleurx cath, in place. Will be followed by [facility]. [facility] will be providing kits in the home The hospital will need to sent 4 kits home per Glennis Brink, [facility] representative   Expected Discharge Date:                  Expected Discharge Plan:     In-House Referral:   yes  Discharge planning Services   yes  Post Acute Care Choice:   yes Choice offered to:   patient  DME Arranged:    DME Agency:     HH Arranged:   yes HH Agency:   [facility]  Status of Service:     If discussed at [place] of Stay Meetings, dates discussed:    Additional Comments:  Shelbie Ammons, RN MSN CCM Care Management [phone] 07/13/2018, 1:39 PM

## 2018-07-13 NOTE — Progress Notes (Signed)
Patient ID: Jenny Cooper, female   DOB: [birth year], 82 y.o.   MRN: [id number]  She had a pretty quiet night.  Some discomfort under her right breast.  Not short of breath.  BP 95/60 range  Labs from yesterday OK  CXRay from today with stable small right pneumothorax  Will plan on removing the large bore chest tube  Instruct family on PleurX catheter care  Will ask for Cardiology recommendations about BP and meds.  Will begin anticoagulation today  [employer].

## 2018-07-14 LAB — SURGICAL PATHOLOGY

## 2018-07-14 NOTE — Addendum Note (Signed)
Addendum  created 07/14/18 1042 by Doreen Salvage, CRNA   Charge Capture section accepted

## 2018-07-17 ENCOUNTER — Other Ambulatory Visit: Payer: Self-pay

## 2018-07-17 NOTE — Patient Outreach (Signed)
[facility]) Care Management  07/17/2018  TAWONNA ESQUER 1936-02-04 [id number]   EMMI- General Discharge RED ON EMMI ALERT Day # 1 Date: 07/16/18 Red Alert Reason:  Scheduled follow-up? No    Outreach attempt: no answer. HIPAA compliant voice message left.   Plan: RN CM will attempt again within 4 business day and send letter.  Jone Baseman, RN, MSN [facility] Care Management Care Management Coordinator Direct Line [phone] Toll Free: [phone]  Fax: [phone]

## 2018-07-18 ENCOUNTER — Inpatient Hospital Stay: Payer: Medicare Other | Attending: Internal Medicine | Admitting: Internal Medicine

## 2018-07-18 ENCOUNTER — Encounter: Payer: Self-pay | Admitting: Internal Medicine

## 2018-07-18 ENCOUNTER — Inpatient Hospital Stay: Payer: Medicare Other

## 2018-07-18 VITALS — BP 120/69 | HR 64 | Temp 97.9°F | Resp 16 | Wt 150.4 lb

## 2018-07-18 DIAGNOSIS — N183 Chronic kidney disease, stage 3 (moderate): Secondary | ICD-10-CM | POA: Diagnosis not present

## 2018-07-18 DIAGNOSIS — I129 Hypertensive chronic kidney disease with stage 1 through stage 4 chronic kidney disease, or unspecified chronic kidney disease: Secondary | ICD-10-CM

## 2018-07-18 DIAGNOSIS — J9610 Chronic respiratory failure, unspecified whether with hypoxia or hypercapnia: Secondary | ICD-10-CM | POA: Insufficient documentation

## 2018-07-18 DIAGNOSIS — Z9981 Dependence on supplemental oxygen: Secondary | ICD-10-CM | POA: Insufficient documentation

## 2018-07-18 DIAGNOSIS — J90 Pleural effusion, not elsewhere classified: Secondary | ICD-10-CM | POA: Diagnosis not present

## 2018-07-18 DIAGNOSIS — Z7901 Long term (current) use of anticoagulants: Secondary | ICD-10-CM | POA: Diagnosis not present

## 2018-07-18 DIAGNOSIS — Z952 Presence of prosthetic heart valve: Secondary | ICD-10-CM

## 2018-07-18 DIAGNOSIS — E876 Hypokalemia: Secondary | ICD-10-CM | POA: Diagnosis not present

## 2018-07-18 DIAGNOSIS — C3401 Malignant neoplasm of right main bronchus: Secondary | ICD-10-CM

## 2018-07-18 NOTE — Assessment & Plan Note (Addendum)
#   Stage III squamous cell lung cancer s/p chemo-RT- 2016. June 08, 2018-CT chest shows progressive consolidation right hilar/anterior lung [approximately 9 x 7 cm; previously 7 x 5 cm]; also right-sided worsening pleural effusion. October 21 PET scan shows-stable consolidation right upper lung/no significant uptake-suggestive of benign radiation etiology; however repeated right-sided effusion noted [see discussion below].  Stable.  #Again reviewed the imaging with the family/patient-given the absence of obvious disease recurrence/progression [especially given negative pleural biopsy/cytology-see below]-I think it is reasonable to monitor the patient.  Again reviewed the imaging at the tumor conference.  # Recurrent pleural effusion right side-status post pleural biopsy post catheter placement-biopsy negative for malignancy.  #Chronic respiratory failure-Home O2 2 L; multifactorial CHF COPD stable.  #Back pain improving s/p kyphoplasty-stable  # CKD-III creatinine 1.37 stable. sec to diuretics.  Stable  # Prosthetic valve/ A. fib on Eliquis-stable  #Hypokalemia-on potassium-stable.  #We will review imaging at the tumor conference/will discuss with Dr. Faith Rogue.   # DISPOSITION:  # follow up in 2 months/labs- cbc/bmp-Dr.B  Cc; Dr.Oaks.

## 2018-07-18 NOTE — Progress Notes (Signed)
[place] OFFICE PROGRESS NOTE  Patient Care Team: Glean Hess, MD as PCP - General (Internal Medicine) Rockey Situ Kathlene November, MD as Consulting Physician (Cardiology) Cammie Sickle, MD as Consulting Physician (Internal Medicine) Alisa Graff, FNP as Nurse Practitioner (Cardiology) Flora Lipps, MD as Consulting Physician (Pulmonary Disease) Marvia Pickles, MD as Consulting Physician (Cardiology) Jon Billings, RN as [place] No matching staging information was found for the patient.   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 Biopsy from hilar area and lymph node station 4R was positive for squamous cell carcinoma.  Patient had compression of the right mainstem bronchus because of enlarged lymph node and a mass; [ T4 N1 M0 tumor stage IIIa ].  2. Started on radiation and chemotherapy from October 21, 2014 3. Finished 6 cycles of carboplatinum and Taxol  in March  29 th of 2016,  PET scan shows significant response  4. Started on consolidation chemotherapy.  Patient finished 2 cycles on July 2016 of carboplatin and Taxol. 5. Atrial fibrillation diagnosis in August of 2016 on eloquis 6. Repeat bronchoscopy was negative for any malignancy  In February 2017.  # SEP 7th 2017- CT [facility]- 5cm hilar mass; bil Ground glass opacities.   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  female pleasant patient above history of stage III squamous cell lung cancer/CHF COPD/recurrent right-sided pleural effusion is here for follow-up.  In the interim patient underwent pleural biopsy/also Pleurx catheter placement.  Procedure was uneventful.  She is currently draining about 10 to 20 cc on a daily basis from her right Pleurx catheter.  She continues to be on oxygen.  Chronic back pain chronic shortness of breath exertion.  Chronic mild cough.  Review of Systems  Constitutional: Positive for malaise/fatigue. Negative for chills, diaphoresis and fever.  HENT: Negative for nosebleeds and sore throat.   Eyes: Negative for double vision.  Respiratory: Positive for cough and shortness of breath. Negative for hemoptysis, sputum production and wheezing.   Cardiovascular: Negative for chest pain, palpitations, orthopnea and leg swelling.  Gastrointestinal: Negative for abdominal pain, blood in stool, constipation, diarrhea, heartburn, melena, nausea and vomiting.  Genitourinary: Negative for dysuria, frequency and urgency.  Musculoskeletal: Positive for back pain and joint pain.  Skin: Negative.  Negative for itching and rash.  Neurological: Negative for dizziness, tingling, focal weakness, weakness and headaches.  Endo/Heme/Allergies: Does not bruise/bleed easily.  Psychiatric/Behavioral: Negative for depression. The patient is not nervous/anxious and does not have insomnia.       PAST MEDICAL HISTORY :  Past Medical History:  Diagnosis Date  . Acute respiratory failure with hypoxia ([place]) 12/11/2017  . Atypical atrial flutter ([name])    a. s/p ablation 07/27/2013 followed by Dr. Rockey Situ  . CAD (coronary artery disease)    a. s/p MI x 2  in 2002 s/p PCI x 2 in 2002; b. s/p 2v CABG 2002; c. stress echo 07/2004 w/ evi of pos & inf infarct & no evi of ischemia; d. 4/08 dipyridamole scan w/ multiple areas  of infarct, no ischemia, EF 49%; e. cath 04/28/15 3v CAD, med Rx rec, no targets for revasc, LM lum irregs, pLAD 30%, 100%, ost-pLCx 60%, [name] 99%, OM2 100%, p-mRCA 90%, [name] 100% L-R collats, VG-mLAD irregs, VG-OM2 oc  . Carcinoma of right lung ([place]) 01/03/2015   a. followed by Dr. Oliva Bustard  . Chronic systolic CHF (congestive heart failure) ([place])    a. echo 03/2015: EF 30-35%, sev ant/inf/pos HK, in mild to mod MR  . Complication of anesthesia    more recently patient oxygen levels do not rebound as quickly  . Compressed spine fracture ([place]) 08/06/2016   lumbar 2, t11, t12  . COPD (chronic obstructive pulmonary disease) ([place])   . Fractured pelvis ([place]) 05/26/2016   2 places  . GERD (gastroesophageal reflux disease)   . History of blood clots    12/2001 left leg  . History of colonoscopy 2013  . History of mammography, screening 2015  . History of Papanicolaou smear of cervix 2013  . HLD (hyperlipidemia)   . HTN (hypertension)   . Hypothyroidism   . Lung cancer ([place])   . Mitral regurgitation    a. s/p mitral ring placement 09/2000; b. echo 09/2010: EF 50%, inf HK, post AK, mild MR, prosthetic mitral valve ring w/ peak gradient of 10 mmHg; b. echo 2/13: EF 50%, mild MR/TR     . Myocardial infarction ([place])    X 2 (LAST ONE IN 2002)  . Neuropathy   . Pacemaker    a. MDT 2002; b. generator replacement 2013; c. followed by Dr. Omelia Blackwater, MD  . PAF (paroxysmal atrial fibrillation) ([place])    a. on Eliquis   . Personal history of chemotherapy   . Personal history of radiation therapy     PAST SURGICAL HISTORY :   Past Surgical History:  Procedure Laterality Date  . ABLATION  04/2016   [facility]  . APPENDECTOMY    . CARDIAC CATHETERIZATION N/A 04/28/2015   Procedure: Left Heart Cath and Coronary Angiography;  Surgeon: Wellington Hampshire, MD;  Location: [place] [facility];  Service: Cardiovascular;  Laterality: N/A;  . CARDIOVERSION N/A 04/08/2017   Procedure: CARDIOVERSION;  Surgeon: Wellington Hampshire, MD;  Location: [facility];  Service: Cardiovascular;  Laterality: N/A;  . CHEST TUBE INSERTION N/A 07/11/2018   Procedure: PLEURX CATH INSERTION;  Surgeon: [name], MD;  Location: [facility];  Service: Thoracic;  Laterality: N/A;  . COLONOSCOPY  12/2009   2 small tubular adenomas  . CORONARY ARTERY BYPASS GRAFT  09/2000  . ECTOPIC PREGNANCY SURGERY    . ELECTROMAGNETIC NAVIGATION BROCHOSCOPY N/A 10/06/2015   Procedure: ELECTROMAGNETIC NAVIGATION BRONCHOSCOPY;  Surgeon: Flora Lipps, MD;  Location: [facility];  Service: Cardiopulmonary;  Laterality: N/A;  . ENDOBRONCHIAL ULTRASOUND N/A 10/06/2015   Procedure: ENDOBRONCHIAL ULTRASOUND;  Surgeon: Flora Lipps, MD;  Location: [facility];  Service: Cardiopulmonary;  Laterality: N/A;  . FLEXIBLE BRONCHOSCOPY Right 07/11/2018   Procedure: FLEXIBLE BRONCHOSCOPY;  Surgeon: [name], MD;  Location: [facility];  Service: Thoracic;  Laterality: Right;  . KYPHOPLASTY N/A 09/28/2016   Procedure: KYPHOPLASTY;  Surgeon: Hessie Knows, MD;  Location: [facility];  Service: Orthopedics;  Laterality: N/A;  . KYPHOPLASTY N/A 02/20/2018   Procedure: [id number];  Surgeon: Hessie Knows, MD;  Location:  [facility];  Service: Orthopedics;  Laterality: N/A;  . PACEMAKER INSERTION  03/2012  . thorocentesis  12/24/2016  . VAGINAL HYSTERECTOMY     partial - left ovary remains  . VIDEO ASSISTED THORACOSCOPY Right 07/11/2018   Procedure: VIDEO ASSISTED THORACOSCOPY;  Surgeon: [name], MD;  Location: [facility];  Service: Thoracic;  Laterality: Right;  Marland Kitchen VIDEO ASSISTED THORACOSCOPY (VATS) W/TALC PLEUADESIS Right 07/11/2018   Procedure: THORACOTOMY PLEURAL BIOPSY WITH TALC PLEURODESIS;  Surgeon: [name], MD;  Location: [facility];  Service: Thoracic;  Laterality: Right;  pleural biopsy    FAMILY HISTORY :   Family History  Problem Relation Age of Onset  . COPD Mother        sister, and brother  . Lung disease Father   . Stroke Maternal Grandmother   .  Hypertension Sister   . COPD Sister   . Hypertension Brother   . COPD Brother   . Colon cancer Brother   . Heart attack Neg Hx   . Breast cancer Neg Hx     SOCIAL HISTORY:   Social History   Tobacco Use  . Smoking status: Former Smoker    Packs/day: 1.00    Years: 40.00    Pack years: 40.00    Types: Cigarettes    Last attempt to quit: 08/30/2000    Years since quitting: 17.8  . Smokeless tobacco: Never Used  . Tobacco comment: quit smoking in 08/28/2000. Smoking cessation materials not required  Substance Use Topics  . Alcohol use: Yes    Alcohol/week: 10.0 standard drinks    Types: 10 Glasses of wine per week    Comment: 2 glasses of wine per day  . Drug use: No    ALLERGIES:  is allergic to lovenox [enoxaparin sodium].  MEDICATIONS:  Current Outpatient Medications  Medication Sig Dispense Refill  . acetaminophen (TYLENOL) 500 MG tablet Take 1,000 mg by mouth every 6 (six) hours as needed for mild pain or moderate pain.     Marland Kitchen amiodarone (PACERONE) 200 MG tablet Take 1 tablet (200 mg total) by mouth daily. 90 tablet 3  . budesonide (PULMICORT) 0.5 MG/2ML nebulizer solution Take 2 mLs (0.5 mg total) by nebulization 2 (two) times daily. 360 mL 5  . Calcium Carb-Cholecalciferol (CALCIUM 600-D PO) Take 1 tablet by mouth every morning.     . cetirizine (ZYRTEC) 10 MG tablet TAKE (1) TABLET BY MOUTH EVERY DAY (Patient taking differently: Take 10 mg by mouth daily. ) 30 tablet 12  . ELIQUIS 5 MG TABS tablet TAKE (1) TABLET BY MOUTH TWICE DAILY (Patient taking differently: Take 5 mg by mouth 2 (two) times daily. ) 180 tablet 3  . levothyroxine (SYNTHROID, LEVOTHROID) 88 MCG tablet Take 1 tablet (88 mcg total) by mouth daily before breakfast. 30 tablet 5  . metoprolol succinate (TOPROL-XL) 25 MG 24 hr tablet TAKE ONE TABLET BY MOUTH EVERY MORNING AND TAKE TWO TABLETS BY MOUTH EVERY EVENING (Patient taking differently: Take 25-50 mg by mouth See admin instructions. Take 25 mg by mouth  in the morning and take 25 mg by mouth in the evening. Except do NOT take any on Sunday.) 90 tablet 2  . Multiple Vitamins-Minerals (CENTRUM SILVER PO) Take 1 tablet by mouth daily.    Marland Kitchen oxyCODONE (OXY IR/ROXICODONE) 5 MG immediate release tablet Take 1 tablet (5 mg total) by mouth every 4 (four) hours as needed for severe pain or breakthrough pain. 30 tablet 0  . OXYGEN Inhale 2 L  into the lungs. continuous    . Revefenacin (YUPELRI) 175 MCG/3ML SOLN Inhale 3 mLs into the lungs daily. DX: J76.77 90 mL 11  . rosuvastatin (CRESTOR) 5 MG tablet Take 1 tablet (5 mg total) by mouth daily. 90 tablet 3  . senna (SENOKOT) 8.6 MG TABS tablet Take 1 tablet (8.6 mg total) by mouth daily. (Patient taking differently: Take 1 tablet by mouth 2 (two) times daily. ) 30 each 0   No current facility-administered medications for this visit.    Facility-Administered Medications Ordered in Other Visits  Medication Dose Route Frequency Provider Last Rate Last Dose  . sodium chloride 0.9 % injection 10 mL  10 mL Intravenous PRN Forest Gleason, MD   10 mL at 02/19/15 1000  . sodium chloride flush (NS) 0.9 % injection 10 mL  10 mL Intravenous PRN Cammie Sickle, MD   10 mL at 02/16/16 1048    PHYSICAL EXAMINATION: ECOG PERFORMANCE STATUS: 2 - Symptomatic, <50% confined to bed  BP 120/69 (BP Location: Left Arm, Patient Position: Sitting)   Pulse 64   Temp 97.9 F (36.6 C) (Tympanic)   Resp 16   Wt 150 lb 5.7 oz (68.2 kg)   BMI 24.27 kg/m   Filed Weights   07/18/18 1020  Weight: 150 lb 5.7 oz (68.2 kg)    Physical Exam  Constitutional: She is oriented to person, place, and time.  Frail-appearing Caucasian female patient.  She is accompanied by her daughter.  She is walking of her own.  On 2 to 3 L of oxygen.  HENT:  Head: Normocephalic and atraumatic.  Mouth/Throat: Oropharynx is clear and moist. No oropharyngeal exudate.  Eyes: Pupils are equal, round, and reactive to light.  Neck: Normal range  of motion. Neck supple.  Cardiovascular: Normal rate and regular rhythm.  Pulmonary/Chest: No respiratory distress. She has no wheezes.  Decreased air entry bilaterally however right more than left.  Abdominal: Soft. Bowel sounds are normal. She exhibits no distension and no mass. There is no tenderness. There is no rebound and no guarding.  Musculoskeletal: Normal range of motion. She exhibits no edema or tenderness.  Neurological: She is alert and oriented to person, place, and time.  Skin: Skin is warm.  Psychiatric: Affect normal.       LABORATORY DATA:  I have reviewed the data as listed    Component Value Date/Time   NA 136 07/12/2018 1444   NA 140 03/01/2018 1432   NA 136 12/24/2014 1457   K 4.5 07/12/2018 1444   K 3.6 12/24/2014 1457   CL 99 07/12/2018 1444   CL 98 (L) 12/24/2014 1457   CO2 27 07/12/2018 1444   CO2 32 12/24/2014 1457   GLUCOSE 110 (H) 07/12/2018 1444   GLUCOSE 107 (H) 12/24/2014 1457   BUN 19 07/12/2018 1444   BUN 20 03/01/2018 1432   BUN 17 12/24/2014 1457   CREATININE 1.25 (H) 07/12/2018 1444   CREATININE 1.00 12/24/2014 1457   CALCIUM 8.8 (L) 07/12/2018 1444   CALCIUM 9.5 12/24/2014 1457   PROT 7.0 07/12/2018 1444   PROT 7.4 12/24/2014 1457   ALBUMIN 3.3 (L) 07/12/2018 1444   ALBUMIN 3.9 12/24/2014 1457   AST 25 07/12/2018 1444   AST 22 12/24/2014 1457   ALT 12 07/12/2018 1444   ALT 19 12/24/2014 1457   ALKPHOS 67 07/12/2018 1444   ALKPHOS 65 12/24/2014 1457   BILITOT 0.6 07/12/2018 1444   BILITOT 0.4 12/24/2014 1457   GFRNONAA  40 (L) 07/12/2018 1444   GFRNONAA 55 (L) 12/24/2014 1457   GFRAA 46 (L) 07/12/2018 1444   GFRAA >60 12/24/2014 1457    No results found for: SPEP, UPEP  Lab Results  Component Value Date   WBC 8.4 07/12/2018   NEUTROABS 2.2 07/06/2018   HGB 12.3 07/12/2018   HCT 39.7 07/12/2018   MCV 96.4 07/12/2018   PLT 199 07/12/2018      Chemistry      Component Value Date/Time   NA 136 07/12/2018 1444   NA  140 03/01/2018 1432   NA 136 12/24/2014 1457   K 4.5 07/12/2018 1444   K 3.6 12/24/2014 1457   CL 99 07/12/2018 1444   CL 98 (L) 12/24/2014 1457   CO2 27 07/12/2018 1444   CO2 32 12/24/2014 1457   BUN 19 07/12/2018 1444   BUN 20 03/01/2018 1432   BUN 17 12/24/2014 1457   CREATININE 1.25 (H) 07/12/2018 1444   CREATININE 1.00 12/24/2014 1457      Component Value Date/Time   CALCIUM 8.8 (L) 07/12/2018 1444   CALCIUM 9.5 12/24/2014 1457   ALKPHOS 67 07/12/2018 1444   ALKPHOS 65 12/24/2014 1457   AST 25 07/12/2018 1444   AST 22 12/24/2014 1457   ALT 12 07/12/2018 1444   ALT 19 12/24/2014 1457   BILITOT 0.6 07/12/2018 1444   BILITOT 0.4 12/24/2014 1457       RADIOGRAPHIC STUDIES: I have personally reviewed the radiological images as listed and agreed with the findings in the report. No results found.   ASSESSMENT & PLAN:  Cancer of hilus of right lung ([place]) # Stage III squamous cell lung cancer s/p chemo-RT- 2016. June 08, 2018-CT chest shows progressive consolidation right hilar/anterior lung [approximately 9 x 7 cm; previously 7 x 5 cm]; also right-sided worsening pleural effusion. October 21 PET scan shows-stable consolidation right upper lung/no significant uptake-suggestive of benign radiation etiology; however repeated right-sided effusion noted [see discussion below].  Stable.  #Again reviewed the imaging with the family/patient-given the absence of obvious disease recurrence/progression [especially given negative pleural biopsy/cytology-see below]-I think it is reasonable to monitor the patient.  Again reviewed the imaging at the tumor conference.  # Recurrent pleural effusion right side-status post pleural biopsy post catheter placement-biopsy negative for malignancy.  #Chronic respiratory failure-Home O2 2 L; multifactorial CHF COPD stable.  #Back pain improving s/p kyphoplasty-stable  # CKD-III creatinine 1.37 stable. sec to diuretics.  Stable  # Prosthetic  valve/ A. fib on Eliquis-stable  #Hypokalemia-on potassium-stable.  #We will review imaging at the tumor conference/will discuss with Dr. Faith Rogue.   # DISPOSITION:  # follow up in 2 months/labs- cbc/bmp-Dr.B  Cc; Dr.Oaks.    Orders Placed This Encounter  Procedures  . CBC with Differential/Platelet    Standing Status:   Future    Standing Expiration Date:   07/19/2019  . Comprehensive metabolic panel    Standing Status:   Future    Standing Expiration Date:   07/19/2019  . CBC with Differential/Platelet    Standing Status:   Future    Standing Expiration Date:   07/19/2019  . Comprehensive metabolic panel    Standing Status:   Future    Standing Expiration Date:   07/19/2019  . Brain natriuretic peptide    Standing Status:   Future    Standing Expiration Date:   07/19/2019   All questions were answered. The patient knows to call the clinic with any problems, questions or concerns.  Cammie Sickle, MD 07/18/2018 11:47 AM

## 2018-07-19 ENCOUNTER — Telehealth: Payer: Self-pay

## 2018-07-19 ENCOUNTER — Other Ambulatory Visit: Payer: Self-pay

## 2018-07-19 DIAGNOSIS — J90 Pleural effusion, not elsewhere classified: Secondary | ICD-10-CM

## 2018-07-19 NOTE — Patient Outreach (Signed)
[facility]) Care Management  07/19/2018  Jenny Cooper Jan 08, 1936 [id number]   EMMI- General Discharge RED ON EMMI ALERT Day # 1 Date:07/16/18 Red Alert Reason: Scheduled follow-up? No    Outreach attempt: no answer. HIPAA compliant voice message left.   Plan: RN CM will attempt again within 4 business day.    Jone Baseman, RN, MSN [place] Management Care Management Coordinator Direct Line [phone] Cell [phone] Toll Free: [phone]  Fax: [phone]

## 2018-07-19 NOTE — Telephone Encounter (Signed)
Left message on patients VM to have chest xray 1 hour prior to seeing Dr.Oaks on 07/21/18.  Spoke  with daughter Asencion Partridge and verbalized chest xray prior to seeing Dr.Oaks.

## 2018-07-20 ENCOUNTER — Other Ambulatory Visit: Payer: Self-pay

## 2018-07-20 ENCOUNTER — Other Ambulatory Visit: Payer: Medicare Other

## 2018-07-20 NOTE — Progress Notes (Signed)
Tumor Board Documentation  Jenny Cooper was presented by Dr Rogue Bussing at our Tumor Board on 07/20/2018, which included representatives from radiation oncology, surgical oncology, navigation, pathology, radiology, surgical, pharmacy, genetics, research, palliative care, pulmonology.  Jenny Cooper currently presents as a current patient with history of the following treatments: adjuvant chemotherapy, adjuvant radiation.  Additionally, we reviewed previous medical and familial history, history of present illness, and recent lab results along with all available histopathologic and imaging studies. The tumor board considered available treatment options and made the following recommendations: Active surveillance Removal of [place] cath  The following procedures/referrals were also placed: No orders of the defined types were placed in this encounter.   Clinical Trial Status: not discussed   Staging used: AJCC Stage Group  National site-specific guidelines NCCN were discussed with respect to the case.  Tumor board is a meeting of clinicians from various specialty areas who evaluate and discuss patients for whom a multidisciplinary approach is being considered. Final determinations in the plan of care are those of the provider(s). The responsibility for follow up of recommendations given during tumor board is that of the provider.   Today's extended care, comprehensive team conference, Jenny Cooper was not present for the discussion and was not examined.   Multidisciplinary Tumor Board is a multidisciplinary case peer review process.  Decisions discussed in the Multidisciplinary Tumor Board reflect the opinions of the specialists present at the conference without having examined the patient.  Ultimately, treatment and diagnostic decisions rest with the primary provider(s) and the patient.

## 2018-07-20 NOTE — Patient Outreach (Signed)
[facility]) Care Management  07/20/2018  Jenny Cooper 10-23-1935 [id number]   EMMI-General Discharge RED ON EMMI ALERT Day #1 Date:07/16/18 Red Alert Reason: Scheduled follow-up? No    Outreach attempt:no answer. HIPAA compliant voice message left.   Plan: RN CM will wait return call.  If no return call will close case.    Jenny Baseman, RN, MSN [place] Management Care Management Coordinator Direct Line [phone] Cell [phone] Toll Free: [phone]  Fax: [phone]

## 2018-07-21 ENCOUNTER — Ambulatory Visit: Payer: Self-pay

## 2018-07-21 ENCOUNTER — Other Ambulatory Visit: Payer: Self-pay

## 2018-07-21 ENCOUNTER — Ambulatory Visit
Admission: RE | Admit: 2018-07-21 | Discharge: 2018-07-21 | Disposition: A | Discharge status: Home / Self Care | Payer: Medicare Other | Source: Ambulatory Visit | Attending: Cardiothoracic Surgery | Admitting: Cardiothoracic Surgery

## 2018-07-21 ENCOUNTER — Encounter: Payer: Self-pay | Admitting: Cardiothoracic Surgery

## 2018-07-21 ENCOUNTER — Ambulatory Visit (INDEPENDENT_AMBULATORY_CARE_PROVIDER_SITE_OTHER): Payer: Medicare Other | Admitting: Cardiothoracic Surgery

## 2018-07-21 VITALS — BP 136/73 | HR 68 | Temp 97.2°F | Resp 20 | Wt 151.0 lb

## 2018-07-21 DIAGNOSIS — I25118 Atherosclerotic heart disease of native coronary artery with other forms of angina pectoris: Secondary | ICD-10-CM

## 2018-07-21 DIAGNOSIS — J90 Pleural effusion, not elsewhere classified: Secondary | ICD-10-CM | POA: Insufficient documentation

## 2018-07-21 DIAGNOSIS — R05 Cough: Secondary | ICD-10-CM | POA: Insufficient documentation

## 2018-07-21 NOTE — Patient Instructions (Addendum)
Please see your follow up appointment listed below. You can drain the catheter every third day.   Please have the chest xray prior to seeing Dr.Oaks.

## 2018-07-21 NOTE — Progress Notes (Signed)
  Patient ID: Jenny Cooper, female   DOB: 04-12-1936, 82 y.o.   MRN: [id number]  HISTORY: She returns today in follow-up.  She has no new complaints.  She has been draining her Pleurx daily.  Its only been between 5 and 10 cc.  Her daughter is been changing her dressings and there is been no complications associated with that.   Vitals:   07/21/18 1211  BP: 136/73  Pulse: 68  Resp: 20  Temp: (!) 97.2 F (36.2 C)  SpO2: 90%     EXAM:    Resp: Lungs are clear bilaterally.  No respiratory distress, normal effort. Heart:  Regular without murmurs Abd:  Abdomen is soft, non distended and non tender. No masses are palpable.  There is no rebound and no guarding.  Neurological: Alert and oriented to person, place, and time. Coordination normal.  Skin: Skin is warm and dry. No rash noted. No diaphoretic. No erythema. No pallor.  Psychiatric: Normal mood and affect. Normal behavior. Judgment and thought content normal.   We did change the dressings today.  The wounds are clean dry and intact.  There is no erythema or drainage   ASSESSMENT: I have independently reviewed her chest x-ray from today.  There is no pleural effusion.   PLAN:   I will see her back again in 2 weeks.  She will cut the drainage from every day to every third day.  She will call prior to her visit to let me know how much is drained so that we may be able to remove it.  She understands and I will see her back in early December.  She will have a chest x-ray made at that time.    [name], MD

## 2018-07-22 ENCOUNTER — Other Ambulatory Visit: Payer: Self-pay | Admitting: Cardiovascular Disease

## 2018-07-22 DIAGNOSIS — C3401 Malignant neoplasm of right main bronchus: Secondary | ICD-10-CM

## 2018-07-24 NOTE — Telephone Encounter (Signed)
   It looks like patient was to stop Entresto 24-26MG  07/11/2018 in the hospital. I contacted patient to verify if she was taking this medication and she said yes she was still taking it. I do not see it on her medication list.   I will route the Eliquis to [facility].

## 2018-07-24 NOTE — Telephone Encounter (Signed)
Pt's wt 68.5 kg, age 82, serum creatinine 1.25, CrCl 38.82.  Eliquis 5 mg BID #180 w/ 1 refill sent to [facility].

## 2018-07-24 NOTE — Telephone Encounter (Signed)
Attempted to reach patient. Phone rang several times and no answer. Need to discuss Entresto.

## 2018-07-31 ENCOUNTER — Other Ambulatory Visit: Payer: Self-pay

## 2018-07-31 NOTE — Patient Outreach (Signed)
[facility]) Care Management  07/31/2018  PACEY WILLADSEN 04-03-1936 [id number]   Multiple attempts to establish contact with patient without success. No response from letter mailed to patient.   Plan: RN CM will close case at this time.   Jone Baseman, RN, MSN [place] Management Care Management Coordinator Direct Line [phone] Cell [phone] Toll Free: [phone]  Fax: [phone]

## 2018-08-01 ENCOUNTER — Telehealth: Payer: Self-pay | Admitting: *Deleted

## 2018-08-01 NOTE — Telephone Encounter (Signed)
Patients daughter called and stated that need to get some Pleurx Bottles, not able to drain the fluid. Patients daughter has called [facility] who is suppose to supply the bottles will not call them back and its been 5 days since the fluid had been drained. She stated that they are suppose to drain it every 3 days.

## 2018-08-01 NOTE — Telephone Encounter (Signed)
I called the patient back and [facility] has talked with her daughter and they are coming for a visit in the morning. Pt appreciates call.

## 2018-08-01 NOTE — Telephone Encounter (Signed)
I called [facility] Manager, Maudie Mercury, she states she will check into her scheduled visits and that it looked like she was suppose to have one today. I called the patient and she states they have not been today. I will call Kim back.

## 2018-08-01 NOTE — Telephone Encounter (Signed)
To Dr. Rockey Situ to review.  The patient was told to stop entresto at recent discharge. Per Jethro Bastos, PA's note, the entresto was to be held until the patient's BP recovered.   She currently has no cardiology follow up. Will obtain MD recommendations for:  1) when should she follow up in office? 2) should she just continue to hold entresto until seen in the office?

## 2018-08-04 ENCOUNTER — Telehealth: Payer: Self-pay | Admitting: *Deleted

## 2018-08-04 NOTE — Telephone Encounter (Signed)
07/20/2018-07/23/2018 was 3 ml of fluid.  07/23/2018-07/28/2018 was 15 ml of fluid 07/28/2018-12/5/019 1 ml of fluid

## 2018-08-07 ENCOUNTER — Encounter: Payer: Self-pay | Admitting: Cardiothoracic Surgery

## 2018-08-07 ENCOUNTER — Ambulatory Visit
Admission: RE | Admit: 2018-08-07 | Discharge: 2018-08-07 | Disposition: A | Discharge status: Home / Self Care | Payer: Medicare Other | Source: Ambulatory Visit | Attending: Cardiothoracic Surgery | Admitting: Cardiothoracic Surgery

## 2018-08-07 ENCOUNTER — Ambulatory Visit (INDEPENDENT_AMBULATORY_CARE_PROVIDER_SITE_OTHER): Payer: Medicare Other | Admitting: Cardiothoracic Surgery

## 2018-08-07 ENCOUNTER — Encounter: Payer: Self-pay | Admitting: *Deleted

## 2018-08-07 ENCOUNTER — Other Ambulatory Visit: Payer: Self-pay | Admitting: Cardiovascular Disease

## 2018-08-07 ENCOUNTER — Other Ambulatory Visit: Payer: Self-pay

## 2018-08-07 VITALS — BP 134/81 | HR 66 | Temp 97.7°F | Resp 18 | Ht 68.0 in | Wt 152.0 lb

## 2018-08-07 DIAGNOSIS — J90 Pleural effusion, not elsewhere classified: Secondary | ICD-10-CM

## 2018-08-07 DIAGNOSIS — I25118 Atherosclerotic heart disease of native coronary artery with other forms of angina pectoris: Secondary | ICD-10-CM

## 2018-08-07 NOTE — Progress Notes (Signed)
Patient's surgery to be scheduled for 08-10-18 at [facility] with Dr. Genevive Bi.  The patient is aware she will be contacted by the [facility] to complete a phone interview sometime in the near future.  The patient is aware to call the office should she have further questions.

## 2018-08-07 NOTE — Patient Instructions (Addendum)
We will call you later and let you know when we will schedule your Pleurx catheter removal.   You will need to stop your Eliquis but do not do so until you are instructed to do so.

## 2018-08-07 NOTE — Progress Notes (Signed)
  Patient ID: Jenny Cooper, female   DOB: 22-May-1936, 82 y.o.   MRN: [id number]  HISTORY: She returns today in follow-up.  She states that she has had no problems in the last several weeks.  She is drained the catheter twice in the last 2 weeks and the first time had about 10 cc in the second time had less than 1 cc.  She did have a chest x-ray made today and those results are still pending however according to my review of the x-rays I do not see any significant pleural effusion on the right.   Vitals:   08/07/18 0943  BP: 134/81  Pulse: 66  Resp: 18  Temp: 97.7 F (36.5 C)  SpO2: 92%     EXAM:    Resp: Lungs are clear bilaterally.  No respiratory distress, normal effort. Heart:  Regular without murmurs Abd:  Abdomen is soft, non distended and non tender. No masses are palpable.  There is no rebound and no guarding.  Neurological: Alert and oriented to person, place, and time. Coordination normal.  Skin: Skin is warm and dry. No rash noted. No diaphoretic. No erythema. No pallor.  The catheter site is clean and dry Psychiatric: Normal mood and affect. Normal behavior. Judgment and thought content normal.    ASSESSMENT: She is desirous of having the tube removed.  It does not appear that she has had any significant pleural effusion.   PLAN:   I explained to her the indications and risks of Pleurx catheter removal.  Risks of bleeding and infection were reviewed.  She understands.  She is on Eliquis and we will stop that 3 days prior to her procedure.  Because of her prior history we will perform this in the operating room.    [name], MD

## 2018-08-07 NOTE — H&P (View-Only) (Signed)
  Patient ID: Jenny Cooper, female   DOB: 1936-08-25, 82 y.o.   MRN: [id number]  HISTORY: She returns today in follow-up.  She states that she has had no problems in the last several weeks.  She is drained the catheter twice in the last 2 weeks and the first time had about 10 cc in the second time had less than 1 cc.  She did have a chest x-ray made today and those results are still pending however according to my review of the x-rays I do not see any significant pleural effusion on the right.   Vitals:   08/07/18 0943  BP: 134/81  Pulse: 66  Resp: 18  Temp: 97.7 F (36.5 C)  SpO2: 92%     EXAM:    Resp: Lungs are clear bilaterally.  No respiratory distress, normal effort. Heart:  Regular without murmurs Abd:  Abdomen is soft, non distended and non tender. No masses are palpable.  There is no rebound and no guarding.  Neurological: Alert and oriented to person, place, and time. Coordination normal.  Skin: Skin is warm and dry. No rash noted. No diaphoretic. No erythema. No pallor.  The catheter site is clean and dry Psychiatric: Normal mood and affect. Normal behavior. Judgment and thought content normal.    ASSESSMENT: She is desirous of having the tube removed.  It does not appear that she has had any significant pleural effusion.   PLAN:   I explained to her the indications and risks of Pleurx catheter removal.  Risks of bleeding and infection were reviewed.  She understands.  She is on Eliquis and we will stop that 3 days prior to her procedure.  Because of her prior history we will perform this in the operating room.    [name], MD

## 2018-08-08 ENCOUNTER — Other Ambulatory Visit: Payer: Self-pay

## 2018-08-08 ENCOUNTER — Telehealth: Payer: Self-pay

## 2018-08-08 ENCOUNTER — Telehealth: Payer: Self-pay | Admitting: Internal Medicine

## 2018-08-08 ENCOUNTER — Encounter
Admission: RE | Admit: 2018-08-08 | Discharge: 2018-08-08 | Disposition: A | Discharge status: Home / Self Care | Payer: Medicare Other | Source: Ambulatory Visit | Attending: Cardiothoracic Surgery | Admitting: Cardiothoracic Surgery

## 2018-08-08 DIAGNOSIS — I110 Hypertensive heart disease with heart failure: Secondary | ICD-10-CM | POA: Diagnosis not present

## 2018-08-08 DIAGNOSIS — Z01812 Encounter for preprocedural laboratory examination: Secondary | ICD-10-CM | POA: Insufficient documentation

## 2018-08-08 DIAGNOSIS — J90 Pleural effusion, not elsewhere classified: Secondary | ICD-10-CM

## 2018-08-08 DIAGNOSIS — I509 Heart failure, unspecified: Secondary | ICD-10-CM | POA: Diagnosis not present

## 2018-08-08 DIAGNOSIS — E039 Hypothyroidism, unspecified: Secondary | ICD-10-CM | POA: Diagnosis not present

## 2018-08-08 DIAGNOSIS — Z87891 Personal history of nicotine dependence: Secondary | ICD-10-CM | POA: Diagnosis not present

## 2018-08-08 DIAGNOSIS — J449 Chronic obstructive pulmonary disease, unspecified: Secondary | ICD-10-CM | POA: Diagnosis not present

## 2018-08-08 DIAGNOSIS — Z7901 Long term (current) use of anticoagulants: Secondary | ICD-10-CM | POA: Diagnosis not present

## 2018-08-08 DIAGNOSIS — Z79899 Other long term (current) drug therapy: Secondary | ICD-10-CM | POA: Diagnosis not present

## 2018-08-08 DIAGNOSIS — Z79890 Hormone replacement therapy (postmenopausal): Secondary | ICD-10-CM | POA: Diagnosis not present

## 2018-08-08 DIAGNOSIS — I252 Old myocardial infarction: Secondary | ICD-10-CM | POA: Diagnosis not present

## 2018-08-08 DIAGNOSIS — Z4682 Encounter for fitting and adjustment of non-vascular catheter: Secondary | ICD-10-CM | POA: Diagnosis not present

## 2018-08-08 HISTORY — DX: Pneumonia, unspecified organism: J18.9

## 2018-08-08 HISTORY — DX: Other abnormalities of gait and mobility: R26.89

## 2018-08-08 LAB — CBC WITH DIFFERENTIAL/PLATELET
Abs Immature Granulocytes: 0.02 10*3/uL (ref 0.00–0.07)
Basophils Absolute: 0 10*3/uL (ref 0.0–0.1)
Basophils Relative: 0 %
Eosinophils Absolute: 0.1 10*3/uL (ref 0.0–0.5)
Eosinophils Relative: 3 %
HEMATOCRIT: 33.8 % — AB (ref 36.0–46.0)
Hemoglobin: 10.5 g/dL — ABNORMAL LOW (ref 12.0–15.0)
Immature Granulocytes: 0 %
LYMPHS ABS: 0.7 10*3/uL (ref 0.7–4.0)
Lymphocytes Relative: 16 %
MCH: 29.2 pg (ref 26.0–34.0)
MCHC: 31.1 g/dL (ref 30.0–36.0)
MCV: 94.2 fL (ref 80.0–100.0)
Monocytes Absolute: 0.7 10*3/uL (ref 0.1–1.0)
Monocytes Relative: 15 %
Neutro Abs: 2.9 10*3/uL (ref 1.7–7.7)
Neutrophils Relative %: 66 %
Platelets: 289 10*3/uL (ref 150–400)
RBC: 3.59 MIL/uL — ABNORMAL LOW (ref 3.87–5.11)
RDW: 14.6 % (ref 11.5–15.5)
WBC: 4.5 10*3/uL (ref 4.0–10.5)
nRBC: 0 % (ref 0.0–0.2)

## 2018-08-08 LAB — APTT: aPTT: 37 seconds — ABNORMAL HIGH (ref 24–36)

## 2018-08-08 LAB — COMPREHENSIVE METABOLIC PANEL
ALBUMIN: 3 g/dL — AB (ref 3.5–5.0)
ALT: 13 U/L (ref 0–44)
AST: 23 U/L (ref 15–41)
Alkaline Phosphatase: 76 U/L (ref 38–126)
Anion gap: 6 (ref 5–15)
BILIRUBIN TOTAL: 0.6 mg/dL (ref 0.3–1.2)
BUN: 12 mg/dL (ref 8–23)
CO2: 29 mmol/L (ref 22–32)
Calcium: 8.4 mg/dL — ABNORMAL LOW (ref 8.9–10.3)
Chloride: 102 mmol/L (ref 98–111)
Creatinine, Ser: 0.92 mg/dL (ref 0.44–1.00)
GFR calc Af Amer: >60 mL/min (ref >60)
GFR calc non Af Amer: 59 mL/min — ABNORMAL LOW (ref >60)
GLUCOSE: 93 mg/dL (ref 70–99)
Potassium: 3.9 mmol/L (ref 3.5–5.1)
Sodium: 137 mmol/L (ref 135–145)
Total Protein: 7.1 g/dL (ref 6.5–8.1)

## 2018-08-08 LAB — PROTIME-INR
INR: 1.21
Prothrombin Time: 15.2 seconds (ref 11.4–15.2)

## 2018-08-08 MED ORDER — CHLORHEXIDINE GLUCONATE CLOTH 2 % EX PADS
6.0000 | MEDICATED_PAD | Freq: Once | CUTANEOUS | Status: DC
  Filled 2018-08-08: qty 6

## 2018-08-08 NOTE — Telephone Encounter (Signed)
"  Perioperative Prescription for Implanted Cardiac Device Programming" faxed to the [facility] at [phone] for completion.

## 2018-08-08 NOTE — Telephone Encounter (Signed)
Patient aware of date and time.

## 2018-08-08 NOTE — Telephone Encounter (Signed)
Unable to complete form as patient's device is not followed by our office. Per notes in Care Everywhere, patient follows with [facility]. Form faxed back to [phone] with this information. Fax confirmation received.

## 2018-08-08 NOTE — Patient Instructions (Addendum)
Your procedure is scheduled on: 08/10/18 Thur Report to Same Day Surgery 2nd floor medical mall [facility] Entrance-take elevator on left to 2nd floor.  Check in with surgery information desk.) To find out your arrival time please call [phone] between 1PM - 3PM on 08/09/18 Wed  Remember: Instructions that are not followed completely may result in serious medical risk, up to and including death, or upon the discretion of your surgeon and anesthesiologist your surgery may need to be rescheduled.    _x___ 1. Do not eat food after midnight the night before your procedure. You may drink clear liquids up to 2 hours before you are scheduled to arrive at the hospital for your procedure.  Do not drink clear liquids within 2 hours of your scheduled arrival to the hospital.  Clear liquids include  --Water or Apple juice without pulp  --Clear carbohydrate beverage such as ClearFast or Gatorade  --Black Coffee or Clear Tea (No milk, no creamers, do not add anything to                  the coffee or Tea Type 1 and type 2 diabetics should only drink water.   ____Ensure clear carbohydrate drink on the way to the hospital for bariatric patients  ____Ensure clear carbohydrate drink 3 hours before surgery for Dr Dwyane Luo patients if physician instructed.   No gum chewing or hard candies.     __x__ 2. No Alcohol for 24 hours before or after surgery.   __x__3. No Smoking or e-cigarettes for 24 prior to surgery.  Do not use any chewable tobacco products for at least 6 hour prior to surgery   ____  4. Bring all medications with you on the day of surgery if instructed.    __x__ 5. Notify your doctor if there is any change in your medical condition     (cold, fever, infections).    x___6. On the morning of surgery brush your teeth with toothpaste and water.  You may rinse your mouth with mouth wash if you wish.  Do not swallow any toothpaste or mouthwash.   Do not wear jewelry, make-up, hairpins,  clips or nail polish.  Do not wear lotions, powders, or perfumes. You may wear deodorant.  Do not shave 48 hours prior to surgery. Men may shave face and neck.  Do not bring valuables to the hospital.    [facility] is not responsible for any belongings or valuables.               Contacts, dentures or bridgework may not be worn into surgery.  Leave your suitcase in the car. After surgery it may be brought to your room.  For patients admitted to the hospital, discharge time is determined by your                       treatment team.  _  Patients discharged the day of surgery will not be allowed to drive home.  You will need someone to drive you home and stay with you the night of your procedure.    Please read over the following fact sheets that you were given:   [facility] Preparing for Surgery and or MRSA Information   _x___ Take anti-hypertensive listed below, cardiac, seizure, asthma,     anti-reflux and psychiatric medicines. These include:  1. amiodarone (PACERONE) 200 MG tablet  2.budesonide (PULMICORT) 0.5 MG/2ML nebulizer solution  3.cetirizine (ZYRTEC) 10 MG tablet  4.ENTRESTO 24-26 MG  5.levothyroxine (SYNTHROID, LEVOTHROID) 88 MCG tablet  6.metoprolol succinate (TOPROL-XL) 25 MG 24 hr table   7.Revefenacin (YUPELRI) 175 MCG/3ML SOLN  8 rosuvastatin (CRESTOR) 5 MG tablet  ____Fleets enema or Magnesium Citrate as directed.   _x___ Use CHG Soap or sage wipes as directed on instruction sheet   _x___ Use inhalers on the day of surgery and bring to hospital day of surgery  ____ Stop Metformin and Janumet 2 days prior to surgery.    ____ Take 1/2 of usual insulin dose the night before surgery and none on the morning     surgery.   _x___ Follow recommendations from Cardiologist, Pulmonologist or PCP regarding          stopping Aspirin, Coumadin, Plavix ,Eliquis, Effient, or Pradaxa, and Pletal. Stopped Eliquis on Mon evening.  X____Stop Anti-inflammatories such as Advil,  Aleve, Ibuprofen, Motrin, Naproxen, Naprosyn, Goodies powders or aspirin products. OK to take Tylenol and                          Celebrex.   _x___ Stop supplements until after surgery.  But may continue Vitamin D, Vitamin B,       and multivitamin.   ____ Bring C-Pap to the hospital.

## 2018-08-08 NOTE — Telephone Encounter (Signed)
Call to patient to let her know about her pre admit testing. She is scheduled to see them today(08/08/18) at 2:15 pm due to the amount of blood work that needs done. I have left the patient a message to call back to confirm that she did receive the message.

## 2018-08-08 NOTE — Pre-Procedure Instructions (Signed)
Called Dr Andree Elk regarding medical history, "should be OK since had surgery just 1 month ago."

## 2018-08-08 NOTE — Telephone Encounter (Signed)
° °  [facility] Pre-operative Risk Assessment    Request for surgical clearance:  1. What type of surgery is being performed?  Drain removal   2. When is this surgery scheduled? 08/10/18  3. What type of clearance is required (medical clearance vs. Pharmacy clearance to hold med vs. Both)? device  4. Are there any medications that need to be held prior to surgery and how long? Unknown   5. Practice name and name of physician performing surgery?  Dr. Genevive Bi   6. What is your office phone number [phone]   7.   What is your office fax number [phone]   8.   Anesthesia type (None, local, MAC, general) ?  Not noted    Jenny Cooper 08/08/2018, 4:19 PM  _________________________________________________________________   (provider comments below)

## 2018-08-09 ENCOUNTER — Encounter: Payer: Self-pay | Admitting: *Deleted

## 2018-08-09 NOTE — Progress Notes (Signed)
Sherry from Anesthesia was contacted today to see if they had received implanted cardiac device programming form from [place].   Form was originally sent to Dr. Rockey Situ and per nurse Raquel Sarna, they were unable to complete form as patient's device is not followed by Dr. Donivan Scull office. Per notes in Care Everywhere, patient follows with [facility]. Form faxed back to [phone] with this information. Fax confirmation received.  Per Judeen Hammans in Anesthesia, they did not receive the form from [facility] but were given all information needed and the reps contact info. Nothing additional needs to be done on our end.   Surgery as scheduled for tomorrow, 08-10-18 with Dr. Genevive Bi.

## 2018-08-10 ENCOUNTER — Other Ambulatory Visit: Payer: Self-pay

## 2018-08-10 ENCOUNTER — Encounter
Admission: RE | Disposition: A | Discharge status: Home / Self Care | Payer: Self-pay | Source: Home / Self Care | Attending: Cardiothoracic Surgery

## 2018-08-10 ENCOUNTER — Ambulatory Visit: Payer: Medicare Other | Admitting: Family

## 2018-08-10 ENCOUNTER — Ambulatory Visit
Admission: RE | Admit: 2018-08-10 | Discharge: 2018-08-10 | Disposition: A | Discharge status: Home / Self Care | Payer: Medicare Other | Attending: Cardiothoracic Surgery | Admitting: Cardiothoracic Surgery

## 2018-08-10 DIAGNOSIS — I110 Hypertensive heart disease with heart failure: Secondary | ICD-10-CM | POA: Insufficient documentation

## 2018-08-10 DIAGNOSIS — E039 Hypothyroidism, unspecified: Secondary | ICD-10-CM | POA: Insufficient documentation

## 2018-08-10 DIAGNOSIS — Z87891 Personal history of nicotine dependence: Secondary | ICD-10-CM | POA: Insufficient documentation

## 2018-08-10 DIAGNOSIS — J90 Pleural effusion, not elsewhere classified: Secondary | ICD-10-CM

## 2018-08-10 DIAGNOSIS — Z4682 Encounter for fitting and adjustment of non-vascular catheter: Secondary | ICD-10-CM | POA: Diagnosis not present

## 2018-08-10 DIAGNOSIS — J449 Chronic obstructive pulmonary disease, unspecified: Secondary | ICD-10-CM | POA: Insufficient documentation

## 2018-08-10 DIAGNOSIS — I509 Heart failure, unspecified: Secondary | ICD-10-CM | POA: Insufficient documentation

## 2018-08-10 DIAGNOSIS — Z7901 Long term (current) use of anticoagulants: Secondary | ICD-10-CM | POA: Insufficient documentation

## 2018-08-10 DIAGNOSIS — Z79890 Hormone replacement therapy (postmenopausal): Secondary | ICD-10-CM | POA: Insufficient documentation

## 2018-08-10 DIAGNOSIS — Z79899 Other long term (current) drug therapy: Secondary | ICD-10-CM | POA: Insufficient documentation

## 2018-08-10 DIAGNOSIS — I252 Old myocardial infarction: Secondary | ICD-10-CM | POA: Insufficient documentation

## 2018-08-10 HISTORY — PX: DRAIN REMOVAL: SHX6697

## 2018-08-10 SURGERY — DRAIN REMOVAL
Anesthesia: Monitor Anesthesia Care | Site: Chest | Laterality: Right

## 2018-08-10 MED ORDER — FAMOTIDINE 20 MG PO TABS
ORAL_TABLET | ORAL | Status: AC
  Administered 2018-08-10: 20 mg via ORAL
  Filled 2018-08-10: qty 1

## 2018-08-10 MED ORDER — ONDANSETRON HCL 4 MG/2ML IJ SOLN: 4.0000 mg | Freq: Once | INTRAMUSCULAR | Status: DC | PRN

## 2018-08-10 MED ORDER — LACTATED RINGERS IV SOLN
INTRAVENOUS | Status: DC
  Administered 2018-08-10: 07:00:00 via INTRAVENOUS

## 2018-08-10 MED ORDER — ONDANSETRON HCL 4 MG/2ML IJ SOLN
INTRAMUSCULAR | Status: DC | PRN
  Administered 2018-08-10: 4 mg via INTRAVENOUS

## 2018-08-10 MED ORDER — CEFAZOLIN SODIUM-DEXTROSE 1-4 GM/50ML-% IV SOLN
1.0000 g | INTRAVENOUS | Status: AC
  Administered 2018-08-10: 1 g via INTRAVENOUS

## 2018-08-10 MED ORDER — FENTANYL CITRATE (PF) 100 MCG/2ML IJ SOLN
INTRAMUSCULAR | Status: AC
  Filled 2018-08-10: qty 2

## 2018-08-10 MED ORDER — BUPIVACAINE HCL (PF) 0.5 % IJ SOLN
INTRAMUSCULAR | Status: DC | PRN
  Administered 2018-08-10: 8 mL

## 2018-08-10 MED ORDER — MIDAZOLAM HCL 2 MG/2ML IJ SOLN
INTRAMUSCULAR | Status: DC | PRN
  Administered 2018-08-10: 1 mg via INTRAVENOUS

## 2018-08-10 MED ORDER — LIDOCAINE HCL (PF) 2 % IJ SOLN
INTRAMUSCULAR | Status: AC
  Filled 2018-08-10: qty 10

## 2018-08-10 MED ORDER — FENTANYL CITRATE (PF) 100 MCG/2ML IJ SOLN
INTRAMUSCULAR | Status: DC | PRN
  Administered 2018-08-10: 50 ug via INTRAVENOUS

## 2018-08-10 MED ORDER — ROCURONIUM BROMIDE 50 MG/5ML IV SOLN
INTRAVENOUS | Status: AC
  Filled 2018-08-10: qty 1

## 2018-08-10 MED ORDER — LIDOCAINE HCL (PF) 1 % IJ SOLN
INTRAMUSCULAR | Status: AC
  Filled 2018-08-10: qty 30

## 2018-08-10 MED ORDER — BUPIVACAINE HCL (PF) 0.5 % IJ SOLN
INTRAMUSCULAR | Status: AC
  Filled 2018-08-10: qty 30

## 2018-08-10 MED ORDER — FENTANYL CITRATE (PF) 100 MCG/2ML IJ SOLN: 25.0000 ug | INTRAMUSCULAR | Status: DC | PRN

## 2018-08-10 MED ORDER — FAMOTIDINE 20 MG PO TABS
20.0000 mg | ORAL_TABLET | Freq: Once | ORAL | Status: AC
  Administered 2018-08-10: 20 mg via ORAL

## 2018-08-10 MED ORDER — PROPOFOL 10 MG/ML IV BOLUS
INTRAVENOUS | Status: AC
  Filled 2018-08-10: qty 40

## 2018-08-10 MED ORDER — MIDAZOLAM HCL 2 MG/2ML IJ SOLN
INTRAMUSCULAR | Status: AC
  Filled 2018-08-10: qty 2

## 2018-08-10 MED ORDER — CEFAZOLIN SODIUM-DEXTROSE 1-4 GM/50ML-% IV SOLN
INTRAVENOUS | Status: AC
  Filled 2018-08-10: qty 50

## 2018-08-10 SURGICAL SUPPLY — 36 items
BLADE SURG SZ11 CARB STEEL (BLADE) ×2 IMPLANT
CANISTER SUCT 1200ML W/VALVE (MISCELLANEOUS) ×2 IMPLANT
CHLORAPREP W/TINT 26ML (MISCELLANEOUS) ×2 IMPLANT
DRAIN CHEST DRY SUCT SGL (MISCELLANEOUS) ×2 IMPLANT
DRAPE INCISE IOBAN 66X45 STRL (DRAPES) ×2 IMPLANT
DRAPE LAPAROTOMY 77X122 PED (DRAPES) ×2 IMPLANT
DRSG OPSITE POSTOP 3X4 (GAUZE/BANDAGES/DRESSINGS) IMPLANT
ELECT REM PT RETURN 9FT ADLT (ELECTROSURGICAL) ×2
ELECTRODE REM PT RTRN 9FT ADLT (ELECTROSURGICAL) ×1 IMPLANT
GLOVE SURG SYN 7.5  E (GLOVE) ×1
GLOVE SURG SYN 7.5 E (GLOVE) ×1 IMPLANT
GOWN STRL REUS W/ TWL LRG LVL3 (GOWN DISPOSABLE) ×2 IMPLANT
GOWN STRL REUS W/TWL LRG LVL3 (GOWN DISPOSABLE) ×2
KIT PLEURX DRAIN CATH 15.5FR (DRAIN) ×2 IMPLANT
KIT TURNOVER KIT A (KITS) ×2 IMPLANT
LABEL OR SOLS (LABEL) ×2 IMPLANT
MARKER SKIN DUAL TIP RULER LAB (MISCELLANEOUS) ×4 IMPLANT
NEEDLE FILTER BLUNT 18X 1/2SAF (NEEDLE) ×1
NEEDLE FILTER BLUNT 18X1 1/2 (NEEDLE) ×1 IMPLANT
PACK BASIN MINOR ARMC (MISCELLANEOUS) ×2 IMPLANT
STRIP CLOSURE SKIN 1/2X4 (GAUZE/BANDAGES/DRESSINGS) IMPLANT
SUCTION FRAZIER HANDLE 10FR (MISCELLANEOUS) ×1
SUCTION TUBE FRAZIER 10FR DISP (MISCELLANEOUS) ×1 IMPLANT
SUT ETHILON 3-0 FS-10 30 BLK (SUTURE) ×2
SUT ETHILON 4-0 (SUTURE)
SUT ETHILON 4-0 FS2 18XMFL BLK (SUTURE)
SUT SILK 1 SH (SUTURE) ×2 IMPLANT
SUT VIC AB 0 SH 27 (SUTURE) ×2 IMPLANT
SUT VIC AB 2-0 SH 27 (SUTURE) ×1
SUT VIC AB 2-0 SH 27XBRD (SUTURE) ×1 IMPLANT
SUT VIC AB 3-0 SH 27 (SUTURE)
SUT VIC AB 3-0 SH 27X BRD (SUTURE) IMPLANT
SUTURE EHLN 3-0 FS-10 30 BLK (SUTURE) ×1 IMPLANT
SUTURE ETHLN 4-0 FS2 18XMF BLK (SUTURE) IMPLANT
SYR 30ML LL (SYRINGE) ×2 IMPLANT
TAPE CLOTH 3X10 WHT NS LF (GAUZE/BANDAGES/DRESSINGS) ×2 IMPLANT

## 2018-08-10 NOTE — Op Note (Signed)
  08/10/2018  8:02 AM  PATIENT:  Jenny Cooper  82 y.o. female  PRE-OPERATIVE DIAGNOSIS: Nonfunctioning Pleurx catheter following talc pleurodesis  POST-OPERATIVE DIAGNOSIS: Same  PROCEDURE: Removal of Pleurx catheter  SURGEON:  Surgeon(s) and Role:    [name], MD - Primary  ASSISTANTS: None  ANESTHESIA: Local with intravenous sedation  INDICATIONS FOR PROCEDURE this patient is an 82 year old woman who is status post thoracoscopy and talc pleurodesis for recurrent pleural effusion.  She had a Pleurx catheter placed at that time.  There is been no drainage from the Pleurx catheter and the chest x-ray shows no evidence of a pleural effusion.  Therefore she elected to have the catheter removed.  The indications and risks were explained to the patient gave her informed consent.  DICTATION: Patient was brought to the operating suite placed in the supine position.  The patient was then turned upwards for removal of her Pleurx catheter.  The right lateral chest wall was prepped and draped in usual sterile fashion.  Intravenous sedation was used and local anesthetic was also used.  8 cc of half percent Marcaine were instilled along the skin insertion site.  Using a combination of blunt dissection the catheter was grasped and withdrawn from the chest.  It was removed intact.  The catheter itself had some fibrinous debris present.  There is no bleeding from the site.  The exit site was closed loosely with a single stitch of 3-0 nylon.  Sterile dressings were applied.  The patient was then transported to the recovery room in stable condition.   [name], MD

## 2018-08-10 NOTE — Transfer of Care (Signed)
Immediate Anesthesia Transfer of Care Note  Patient: Jenny Cooper  Procedure(s) Performed: Niel Hummer REMOVAL (Right Chest)  Patient Location: PACU  Anesthesia Type:MAC  Level of Consciousness: awake, alert  and oriented  Airway & Oxygen Therapy: Patient Spontanous Breathing and Patient connected to nasal cannula oxygen  Post-op Assessment: Report given to RN and Post -op Vital signs reviewed and stable  Post vital signs: Reviewed and stable  Last Vitals:  Vitals Value Taken Time  BP    Temp    Pulse    Resp    SpO2      Last Pain:  Vitals:   08/10/18 0655  TempSrc: Temporal  PainSc: 3          Complications: No apparent anesthesia complications

## 2018-08-10 NOTE — Anesthesia Post-op Follow-up Note (Signed)
Anesthesia QCDR form completed.        

## 2018-08-10 NOTE — Interval H&P Note (Signed)
History and Physical Interval Note:  08/10/2018 7:23 AM  Jenny Cooper  has presented today for surgery, with the diagnosis of RIGHT LUNG CANCER  The various methods of treatment have been discussed with the patient and family. After consideration of risks, benefits and other options for treatment, the patient has consented to  Procedure(s): DRAIN REMOVAL (Right) as a surgical intervention .  The patient's history has been reviewed, patient examined, no change in status, stable for surgery.  I have reviewed the patient's chart and labs.  Questions were answered to the patient's satisfaction.     [name]

## 2018-08-10 NOTE — Anesthesia Preprocedure Evaluation (Signed)
Anesthesia Evaluation  Patient identified by MRN, date of birth, ID band Patient awake    Reviewed: Allergy & Precautions, H&P , NPO status , Patient's Chart, lab work & pertinent test results, reviewed documented beta blocker date and time   History of Anesthesia Complications (+) history of anesthetic complications  Airway Mallampati: II   Neck ROM: full    Dental  (+) Poor Dentition   Pulmonary neg pulmonary ROS, pneumonia, COPD, former smoker,    Pulmonary exam normal        Cardiovascular Exercise Tolerance: Poor hypertension, On Medications + angina with exertion + CAD, + Past MI and +CHF  negative cardio ROS Normal cardiovascular exam+ pacemaker  Rhythm:regular Rate:Normal     Neuro/Psych  Neuromuscular disease negative neurological ROS  negative psych ROS   GI/Hepatic negative GI ROS, Neg liver ROS, GERD  ,  Endo/Other  negative endocrine ROSdiabetes, Well Controlled, Type 2Hypothyroidism   Renal/GU negative Renal ROS  negative genitourinary   Musculoskeletal   Abdominal   Peds  Hematology negative hematology ROS (+)   Anesthesia Other Findings Past Medical History: 12/11/2017: Acute respiratory failure with hypoxia (HCC) No date: Atypical atrial flutter (HCC)     Comment:  a. s/p ablation 07/27/2013 followed by Dr. Rockey Situ No date: Balance problem No date: CAD (coronary artery disease)     Comment:  a. s/p MI x 2 in 2002 s/p PCI x 2 in 2002; b. s/p 2v               CABG 2002; c. stress echo 07/2004 w/ evi of pos & inf               infarct & no evi of ischemia; d. 4/08 dipyridamole scan               w/ multiple areas of infarct, no ischemia, EF 49%; e.               cath 04/28/15 3v CAD, med Rx rec, no targets for revasc,               LM lum irregs, pLAD 30%, 100%, ost-pLCx 60%, [name] 99%,               OM2 100%, p-mRCA 90%, [name] 100% L-R collats, VG-mLAD               irregs, VG-OM2 oc 01/03/2015:  Carcinoma of right lung (HCC)     Comment:  a. followed by Dr. Oliva Bustard No date: Chronic systolic CHF (congestive heart failure) ([place])     Comment:  a. echo 03/2015: EF 30-35%, sev ant/inf/pos HK, in mild               to mod MR No date: Complication of anesthesia     Comment:  more recently patient oxygen levels do not rebound as               quickly 08/06/2016: Compressed spine fracture [facility])     Comment:  lumbar 2, t11, t12 No date: COPD (chronic obstructive pulmonary disease) ([place]) 05/26/2016: Fractured pelvis ([place])     Comment:  2 places No date: GERD (gastroesophageal reflux disease) No date: History of blood clots     Comment:  12/2001 left leg 2013: History of colonoscopy 2015: History of mammography, screening 2013: History of Papanicolaou smear of cervix No date: HLD (hyperlipidemia) No date: HTN (hypertension) No date: Hypothyroidism No date: Lung cancer ([place]) No date: Mitral regurgitation  Comment:  a. s/p mitral ring placement 09/2000; b. echo 09/2010: EF               50%, inf HK, post AK, mild MR, prosthetic mitral valve               ring w/ peak gradient of 10 mmHg; b. echo 2/13: EF 50%,               mild MR/TR    No date: Myocardial infarction ([place])     Comment:  X 2 (LAST ONE IN 2002) No date: Neuropathy No date: Neuropathy No date: Pacemaker     Comment:  a. MDT 2002; b. generator replacement 2013; c. followed               by Dr. Omelia Blackwater, MD No date: PAF (paroxysmal atrial fibrillation) ([place])     Comment:  a. on Eliquis  No date: Personal history of chemotherapy No date: Personal history of radiation therapy No date: Pneumonia Past Surgical History: 04/2016: ABLATION     Comment:  [facility] No date: APPENDECTOMY 04/28/2015: CARDIAC CATHETERIZATION; N/A     Comment:  Procedure: Left Heart Cath and Coronary Angiography;                Surgeon: Wellington Hampshire, MD;  Location: [facility]               CV LAB;  Service: Cardiovascular;  Laterality:  N/A; 04/08/2017: CARDIOVERSION; N/A     Comment:  Procedure: CARDIOVERSION;  Surgeon: Wellington Hampshire,               MD;  Location: [facility];  Service: Cardiovascular;                Laterality: N/A; 07/11/2018: CHEST TUBE INSERTION; N/A     Comment:  Procedure: PLEURX CATH INSERTION;  Surgeon: [name], MD;  Location: [facility];  Service: Thoracic;                Laterality: N/A; 12/2009: COLONOSCOPY     Comment:  2 small tubular adenomas 09/2000: CORONARY ARTERY BYPASS GRAFT No date: ECTOPIC PREGNANCY SURGERY 10/06/2015: ELECTROMAGNETIC NAVIGATION BROCHOSCOPY; N/A     Comment:  Procedure: ELECTROMAGNETIC NAVIGATION BRONCHOSCOPY;                Surgeon: Flora Lipps, MD;  Location: [facility];  Service:               Cardiopulmonary;  Laterality: N/A; 10/06/2015: ENDOBRONCHIAL ULTRASOUND; N/A     Comment:  Procedure: ENDOBRONCHIAL ULTRASOUND;  Surgeon: Flora Lipps, MD;  Location: [facility];  Service: Cardiopulmonary;              Laterality: N/A; 07/11/2018: FLEXIBLE BRONCHOSCOPY; Right     Comment:  Procedure: FLEXIBLE BRONCHOSCOPY;  Surgeon: [name], MD;  Location: [facility];  Service: Thoracic;                Laterality: Right; 09/28/2016: KYPHOPLASTY; N/A     Comment:  Procedure: KYPHOPLASTY;  Surgeon: Hessie Knows, MD;                Location: [facility];  Service: Orthopedics;  Laterality:  N/A; 02/20/2018: KYPHOPLASTY; N/A     Comment:  Procedure: [id number];  Surgeon: Hessie Knows, MD;               Location: [facility];  Service: Orthopedics;  Laterality:               N/A; 03/2012: PACEMAKER INSERTION 12/24/2016: thorocentesis No date: VAGINAL HYSTERECTOMY     Comment:  partial - left ovary remains 07/11/2018: VIDEO ASSISTED THORACOSCOPY; Right     Comment:  Procedure: VIDEO ASSISTED THORACOSCOPY;  Surgeon: [name], MD;  Location: [facility];  Service: Thoracic;                Laterality:  Right; 07/11/2018: VIDEO ASSISTED THORACOSCOPY (VATS) W/TALC PLEUADESIS;  Right     Comment:  Procedure: THORACOTOMY PLEURAL BIOPSY WITH TALC               PLEURODESIS;  Surgeon: [name], MD;  Location: [facility]              ORS;  Service: Thoracic;  Laterality: Right;  pleural               biopsy BMI    Body Mass Index:  23.89 kg/m     Reproductive/Obstetrics negative OB ROS                             Anesthesia Physical Anesthesia Plan  ASA: IV  Anesthesia Plan: General and MAC   Post-op Pain Management:    Induction:   PONV Risk Score and Plan: 4 or greater  Airway Management Planned:   Additional Equipment:   Intra-op Plan:   Post-operative Plan:   Informed Consent: I have reviewed the patients History and Physical, chart, labs and discussed the procedure including the risks, benefits and alternatives for the proposed anesthesia with the patient or authorized representative who has indicated his/her understanding and acceptance.   Dental Advisory Given  Plan Discussed with: CRNA  Anesthesia Plan Comments:         Anesthesia Quick Evaluation

## 2018-08-11 ENCOUNTER — Encounter: Payer: Self-pay | Admitting: Cardiothoracic Surgery

## 2018-08-14 NOTE — Anesthesia Postprocedure Evaluation (Signed)
Anesthesia Post Note  Patient: Jenny Cooper  Procedure(s) Performed: Niel Hummer REMOVAL (Right Chest)  Patient location during evaluation: PACU Anesthesia Type: MAC Level of consciousness: awake and alert Pain management: pain level controlled Vital Signs Assessment: post-procedure vital signs reviewed and stable Respiratory status: spontaneous breathing, nonlabored ventilation, respiratory function stable and patient connected to nasal cannula oxygen Cardiovascular status: blood pressure returned to baseline and stable Postop Assessment: no apparent nausea or vomiting Anesthetic complications: no     Last Vitals:  Vitals:   08/10/18 0831 08/10/18 0848  BP: (!) 147/62 127/68  Pulse:  62  Resp:    Temp: 36.7 C 36.6 C  SpO2:      Last Pain:  Vitals:   08/10/18 0848  TempSrc: Temporal  PainSc: 0-No pain                 Molli Barrows

## 2018-08-15 ENCOUNTER — Other Ambulatory Visit: Payer: Self-pay

## 2018-08-15 ENCOUNTER — Inpatient Hospital Stay: Payer: Medicare Other | Attending: Internal Medicine | Admitting: Internal Medicine

## 2018-08-15 ENCOUNTER — Encounter: Payer: Self-pay | Admitting: Internal Medicine

## 2018-08-15 ENCOUNTER — Inpatient Hospital Stay: Payer: Medicare Other

## 2018-08-15 VITALS — BP 107/66 | HR 76 | Temp 97.6°F | Resp 20 | Ht 66.0 in | Wt 154.0 lb

## 2018-08-15 DIAGNOSIS — I129 Hypertensive chronic kidney disease with stage 1 through stage 4 chronic kidney disease, or unspecified chronic kidney disease: Secondary | ICD-10-CM | POA: Diagnosis not present

## 2018-08-15 DIAGNOSIS — N183 Chronic kidney disease, stage 3 (moderate): Secondary | ICD-10-CM | POA: Insufficient documentation

## 2018-08-15 DIAGNOSIS — Z7901 Long term (current) use of anticoagulants: Secondary | ICD-10-CM | POA: Diagnosis not present

## 2018-08-15 DIAGNOSIS — C3401 Malignant neoplasm of right main bronchus: Secondary | ICD-10-CM

## 2018-08-15 DIAGNOSIS — Z800 Family history of malignant neoplasm of digestive organs: Secondary | ICD-10-CM | POA: Diagnosis not present

## 2018-08-15 DIAGNOSIS — J90 Pleural effusion, not elsewhere classified: Secondary | ICD-10-CM | POA: Diagnosis not present

## 2018-08-15 DIAGNOSIS — J449 Chronic obstructive pulmonary disease, unspecified: Secondary | ICD-10-CM | POA: Insufficient documentation

## 2018-08-15 DIAGNOSIS — D649 Anemia, unspecified: Secondary | ICD-10-CM | POA: Diagnosis not present

## 2018-08-15 DIAGNOSIS — Z952 Presence of prosthetic heart valve: Secondary | ICD-10-CM | POA: Diagnosis not present

## 2018-08-15 DIAGNOSIS — E876 Hypokalemia: Secondary | ICD-10-CM | POA: Insufficient documentation

## 2018-08-15 DIAGNOSIS — M549 Dorsalgia, unspecified: Secondary | ICD-10-CM | POA: Insufficient documentation

## 2018-08-15 DIAGNOSIS — J9610 Chronic respiratory failure, unspecified whether with hypoxia or hypercapnia: Secondary | ICD-10-CM | POA: Diagnosis not present

## 2018-08-15 NOTE — Assessment & Plan Note (Addendum)
#   Stage III squamous cell lung cancer s/p chemo-RT- 2016; Recurrent pleural effusion [see discussion below]- October 21 PET scan shows-stable consolidation right upper lung/no significant uptake-suggestive of benign radiation etiology.  Clinically stable; no obvious recurrence of disease.  Continue surveillance.  #Recurrent pleural effusion-status post pleural biopsy negative for malignancy status post pleurodesis.  Pleurx cath catheter explanted on December 12.  We will plan to get a chest x-ray in 2 months.  #Chronic respiratory failure-Home O2 2 L; multifactorial CHF COPD stable  #Mild anemia hemoglobin 10.5-no obvious iron deficiency suspected.  If worse would recommend further work-up.   #Back pain improving s/p kyphoplasty-stable  # CKD-III creatinine 1.37 stable. sec to diuretics.  Stable  # Prosthetic valve/ A. fib on Eliquis-stable  #Hypokalemia-on potassium-s stable  # DISPOSITION:  # follow up in 2 months-Dr. Corcoran/labs- cbc/bmp/chest x-ray- Dr.B  Cc; Dr.Oaks.

## 2018-08-15 NOTE — Progress Notes (Signed)
[place] OFFICE PROGRESS NOTE  Patient Care Team: Glean Hess, MD as PCP - General (Internal Medicine) Minna Merritts, MD as Consulting Physician (Cardiology) Cammie Sickle, MD as Consulting Physician (Internal Medicine) Alisa Graff, FNP as Nurse Practitioner (Cardiology) Flora Lipps, MD as Consulting Physician (Pulmonary Disease) Marvia Pickles, MD as Consulting Physician (Cardiology)  Cancer Staging No matching staging information was found for the patient.   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 Biopsy from hilar area and lymph node station 4R was positive for squamous cell carcinoma.  Patient had compression of the right mainstem bronchus because of enlarged lymph node and a mass; [ T4 N1 M0 tumor stage IIIa ].  2. Started on radiation and chemotherapy from October 21, 2014 3. Finished 6 cycles of carboplatinum and Taxol  in March  29 th of 2016,  PET scan shows significant response  4. Started on consolidation chemotherapy.  Patient finished 2 cycles on July 2016 of carboplatin and Taxol. 5. Atrial fibrillation diagnosis in August of 2016 on eloquis 6. Repeat bronchoscopy was negative for any malignancy  In February 2017.  # SEP 7th 2017- CT [facility]- 5cm hilar mass; bil Ground glass opacities.   # Radiation Pneumonitis [Dr.Mungal] on Prednisone  # Recurrent plueral effusion-[NOV 2019 ]s/p pleural Bx/ pleurex cath [Dr.Oaks]; NEGATIVE for malignancy; Pleurx catheter explanted on December 12th 2019  # May 1st 2018- F one- No targettable mutations; Int-TMB; ? 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  female pleasant patient above history of stage III squamous cell lung cancer/CHF COPD/recurrent right-sided pleural effusion is here for follow-up.  In the interim patient underwent explantation of the Pleurx catheter.  She is mildly tender site of the Pleurx catheter.  Denies any worsening shortness of breath or cough.  Denies any swelling in the legs.  No nausea no vomiting.  She continues been oxygen. .  Review of Systems  Constitutional: Positive for malaise/fatigue. Negative for chills, diaphoresis and fever.  HENT: Negative for nosebleeds and sore throat.   Eyes: Negative for double vision.  Respiratory: Positive for cough and shortness of breath. Negative for hemoptysis, sputum production and wheezing.   Cardiovascular: Negative for chest pain, palpitations, orthopnea and leg swelling.  Gastrointestinal: Negative for abdominal pain, blood in stool, constipation, diarrhea, heartburn, melena, nausea and vomiting.  Genitourinary: Negative for dysuria, frequency and urgency.  Musculoskeletal: Positive for back pain and joint pain.  Skin: Negative.  Negative for itching and rash.  Neurological: Negative for dizziness, tingling, focal weakness, weakness and headaches.  Endo/Heme/Allergies: Does not bruise/bleed easily.  Psychiatric/Behavioral: Negative for depression. The patient is not nervous/anxious and does not have insomnia.       PAST MEDICAL HISTORY :  Past Medical History:  Diagnosis Date  . Acute respiratory failure with hypoxia ([place]) 12/11/2017  . Atypical atrial flutter ([place])    a. s/p ablation 07/27/2013 followed by Dr. Rockey Situ  . Balance problem   . CAD (coronary artery disease)    a. s/p MI x 2 in 2002 s/p PCI x 2 in 2002;  b. s/p 2v CABG 2002; c. stress echo 07/2004 w/ evi of pos & inf infarct & no evi of ischemia; d. 4/08 dipyridamole scan w/ multiple areas of infarct, no ischemia, EF 49%; e. cath 04/28/15 3v  CAD, med Rx rec, no targets for revasc, LM lum irregs, pLAD 30%, 100%, ost-pLCx 60%, [name] 99%, OM2 100%, p-mRCA 90%, [name] 100% L-R collats, VG-mLAD irregs, VG-OM2 oc  . Carcinoma of right lung ([place]) 01/03/2015   a. followed by Dr. Oliva Bustard  . Chronic systolic CHF (congestive heart failure) ([place])    a. echo 03/2015: EF 30-35%, sev ant/inf/pos HK, in mild to mod MR  . Complication of anesthesia    more recently patient oxygen levels do not rebound as quickly  . Compressed spine fracture ([place]) 08/06/2016   lumbar 2, t11, t12  . COPD (chronic obstructive pulmonary disease) ([place])   . Fractured pelvis ([place]) 05/26/2016   2 places  . GERD (gastroesophageal reflux disease)   . History of blood clots    12/2001 left leg  . History of colonoscopy 2013  . History of mammography, screening 2015  . History of Papanicolaou smear of cervix 2013  . HLD (hyperlipidemia)   . HTN (hypertension)   . Hypothyroidism   . Lung cancer ([place])   . Mitral regurgitation    a. s/p mitral ring placement 09/2000; b. echo 09/2010: EF 50%, inf HK, post AK, mild MR, prosthetic mitral valve ring w/ peak gradient of 10 mmHg; b. echo 2/13: EF 50%, mild MR/TR     . Myocardial infarction ([place])    X 2 (LAST ONE IN 2002)  . Neuropathy   . Neuropathy   . Pacemaker    a. MDT 2002; b. generator replacement 2013; c. followed by Dr. Omelia Blackwater, MD  . PAF (paroxysmal atrial fibrillation) ([place])    a. on Eliquis   . Personal history of chemotherapy   . Personal history of radiation therapy   . Pneumonia     PAST SURGICAL HISTORY :   Past Surgical History:  Procedure Laterality Date  . ABLATION  04/2016   [facility]  . APPENDECTOMY    . CARDIAC CATHETERIZATION N/A 04/28/2015   Procedure: Left Heart Cath and Coronary Angiography;  Surgeon: Wellington Hampshire, MD;  Location: [facility];  Service: Cardiovascular;  Laterality: N/A;  . CARDIOVERSION N/A 04/08/2017   Procedure: CARDIOVERSION;  Surgeon: Wellington Hampshire, MD;  Location:  [facility];  Service: Cardiovascular;  Laterality: N/A;  . CHEST TUBE INSERTION N/A 07/11/2018   Procedure: PLEURX CATH INSERTION;  Surgeon: [name], MD;  Location: [facility];  Service: Thoracic;  Laterality: N/A;  . COLONOSCOPY  12/2009   2 small tubular adenomas  . CORONARY ARTERY BYPASS GRAFT  09/2000  . DRAIN REMOVAL Right 08/10/2018   Procedure: DRAIN REMOVAL;  Surgeon: [name], MD;  Location: [facility];  Service: General;  Laterality: Right;  . ECTOPIC PREGNANCY SURGERY    . ELECTROMAGNETIC NAVIGATION BROCHOSCOPY N/A 10/06/2015   Procedure: ELECTROMAGNETIC NAVIGATION BRONCHOSCOPY;  Surgeon: Flora Lipps, MD;  Location: [facility];  Service: Cardiopulmonary;  Laterality: N/A;  . ENDOBRONCHIAL ULTRASOUND N/A 10/06/2015   Procedure: ENDOBRONCHIAL ULTRASOUND;  Surgeon: Flora Lipps, MD;  Location: [facility];  Service: Cardiopulmonary;  Laterality: N/A;  . FLEXIBLE BRONCHOSCOPY Right 07/11/2018   Procedure: FLEXIBLE BRONCHOSCOPY;  Surgeon: [name], MD;  Location: [facility];  Service: Thoracic;  Laterality: Right;  . KYPHOPLASTY N/A 09/28/2016   Procedure: KYPHOPLASTY;  Surgeon: Hessie Knows, MD;  Location: [facility];  Service: Orthopedics;  Laterality: N/A;  . KYPHOPLASTY N/A 02/20/2018   Procedure: DIYMEBRAXEN-M0;  Surgeon: Hessie Knows, MD;  Location: [facility];  Service: Orthopedics;  Laterality: N/A;  . PACEMAKER INSERTION  03/2012  . thorocentesis  12/24/2016  . VAGINAL HYSTERECTOMY     partial - left ovary remains  . VIDEO ASSISTED THORACOSCOPY Right 07/11/2018   Procedure: VIDEO ASSISTED THORACOSCOPY;  Surgeon: [name], MD;  Location: [facility];  Service: Thoracic;  Laterality: Right;  Marland Kitchen VIDEO ASSISTED THORACOSCOPY (VATS) W/TALC PLEUADESIS Right 07/11/2018   Procedure: THORACOTOMY PLEURAL BIOPSY WITH TALC PLEURODESIS;  Surgeon: [name], MD;  Location: [facility];  Service: Thoracic;  Laterality: Right;  pleural biopsy    FAMILY HISTORY :   Family History  Problem  Relation Age of Onset  . COPD Mother        sister, and brother  . Lung disease Father   . Stroke Maternal Grandmother   . Hypertension Sister   . COPD Sister   . Hypertension Brother   . COPD Brother   . Colon cancer Brother   . Heart attack Neg Hx   . Breast cancer Neg Hx     SOCIAL HISTORY:   Social History   Tobacco Use  . Smoking status: Former Smoker    Packs/day: 1.00    Years: 40.00    Pack years: 40.00    Types: Cigarettes    Last attempt to quit: 08/30/2000    Years since quitting: 17.9  . Smokeless tobacco: Never Used  . Tobacco comment: quit smoking in 08/28/2000. Smoking cessation materials not required  Substance Use Topics  . Alcohol use: Not Currently    Alcohol/week: 10.0 standard drinks    Types: 10 Glasses of wine per week    Comment: 2 glasses of wine per day  . Drug use: No    ALLERGIES:  is allergic to lovenox [enoxaparin sodium].  MEDICATIONS:  Current Outpatient Medications  Medication Sig Dispense Refill  . acetaminophen (TYLENOL) 500 MG tablet Take 1,000 mg by mouth every 6 (six) hours as needed for mild pain or moderate pain.     Marland Kitchen amiodarone (PACERONE) 200 MG tablet Take 1 tablet (200 mg total) by mouth daily. 90 tablet 3  . budesonide (PULMICORT) 0.5 MG/2ML nebulizer solution Take 2 mLs (0.5 mg total) by nebulization 2 (two) times daily. 360 mL 5  . Calcium Carb-Cholecalciferol (CALCIUM 600-D PO) Take 1 tablet by mouth every morning.     . cetirizine (ZYRTEC) 10 MG tablet TAKE (1) TABLET BY MOUTH EVERY DAY (Patient taking differently: Take 10 mg by mouth daily. ) 30 tablet 12  . ELIQUIS 5 MG TABS tablet TAKE (1) TABLET BY MOUTH TWICE DAILY (Patient taking differently: Take 5 mg by mouth 2 (two) times daily. ) 180 tablet 3  . ENTRESTO 24-26 MG Take 1 tablet by mouth 2 (two) times daily.     Marland Kitchen levothyroxine (SYNTHROID, LEVOTHROID) 88 MCG tablet Take 1 tablet (88 mcg total) by mouth daily before breakfast. 30 tablet 5  . metoprolol succinate  (TOPROL-XL) 25 MG 24 hr tablet TAKE ONE TABLET BY MOUTH EVERY MORNING AND TAKE TWO TABLETS BY MOUTH EVERY EVENING (Patient taking differently: Take 25-50 mg by mouth See admin instructions. Take 25 mg by mouth in the morning and 50 mg in the evening) 90 tablet 2  . Multiple Vitamins-Minerals (CENTRUM SILVER PO) Take 1 tablet by mouth daily.    Marland Kitchen oxyCODONE (OXY IR/ROXICODONE) 5  MG immediate release tablet Take 1 tablet (5 mg total) by mouth every 4 (four) hours as needed for severe pain or breakthrough pain. 30 tablet 0  . OXYGEN Inhale 2 L into the lungs continuous.     . Revefenacin (YUPELRI) 175 MCG/3ML SOLN Inhale 3 mLs into the lungs daily. DX: J76.77 90 mL 11  . rosuvastatin (CRESTOR) 5 MG tablet Take 1 tablet (5 mg total) by mouth daily. 90 tablet 3  . senna (SENOKOT) 8.6 MG TABS tablet Take 1 tablet (8.6 mg total) by mouth daily. (Patient taking differently: Take 1 tablet by mouth 2 (two) times daily. ) 30 each 0   No current facility-administered medications for this visit.    Facility-Administered Medications Ordered in Other Visits  Medication Dose Route Frequency Provider Last Rate Last Dose  . sodium chloride 0.9 % injection 10 mL  10 mL Intravenous PRN Forest Gleason, MD   10 mL at 02/19/15 1000  . sodium chloride flush (NS) 0.9 % injection 10 mL  10 mL Intravenous PRN Cammie Sickle, MD   10 mL at 02/16/16 1048    PHYSICAL EXAMINATION: ECOG PERFORMANCE STATUS: 2 - Symptomatic, <50% confined to bed  BP 107/66 (BP Location: Left Arm, Patient Position: Sitting)   Pulse 76   Temp 97.6 F (36.4 C) (Tympanic)   Resp 20   Ht 5' 6" (1.676 m)   Wt 154 lb (69.9 kg)   BMI 24.86 kg/m   Filed Weights   08/15/18 1033  Weight: 154 lb (69.9 kg)    Physical Exam  Constitutional: She is oriented to person, place, and time.  Frail-appearing Caucasian female patient.  She is accompanied by her daughter.  She is walking of her own.  On 2 to 3 L of oxygen.  HENT:  Head:  Normocephalic and atraumatic.  Mouth/Throat: Oropharynx is clear and moist. No oropharyngeal exudate.  Eyes: Pupils are equal, round, and reactive to light.  Neck: Normal range of motion. Neck supple.  Cardiovascular: Normal rate and regular rhythm.  Pulmonary/Chest: No respiratory distress. She has no wheezes.  Decreased air entry bilaterally however right more than left.  Abdominal: Soft. Bowel sounds are normal. She exhibits no distension and no mass. There is no abdominal tenderness. There is no rebound and no guarding.  Musculoskeletal: Normal range of motion.        General: No tenderness or edema.  Neurological: She is alert and oriented to person, place, and time.  Skin: Skin is warm.  Psychiatric: Affect normal.       LABORATORY DATA:  I have reviewed the data as listed    Component Value Date/Time   NA 137 08/08/2018 1504   NA 140 03/01/2018 1432   NA 136 12/24/2014 1457   K 3.9 08/08/2018 1504   K 3.6 12/24/2014 1457   CL 102 08/08/2018 1504   CL 98 (L) 12/24/2014 1457   CO2 29 08/08/2018 1504   CO2 32 12/24/2014 1457   GLUCOSE 93 08/08/2018 1504   GLUCOSE 107 (H) 12/24/2014 1457   BUN 12 08/08/2018 1504   BUN 20 03/01/2018 1432   BUN 17 12/24/2014 1457   CREATININE 0.92 08/08/2018 1504   CREATININE 1.00 12/24/2014 1457   CALCIUM 8.4 (L) 08/08/2018 1504   CALCIUM 9.5 12/24/2014 1457   PROT 7.1 08/08/2018 1504   PROT 7.4 12/24/2014 1457   ALBUMIN 3.0 (L) 08/08/2018 1504   ALBUMIN 3.9 12/24/2014 1457   AST 23 08/08/2018 1504   AST  22 12/24/2014 1457   ALT 13 08/08/2018 1504   ALT 19 12/24/2014 1457   ALKPHOS 76 08/08/2018 1504   ALKPHOS 65 12/24/2014 1457   BILITOT 0.6 08/08/2018 1504   BILITOT 0.4 12/24/2014 1457   GFRNONAA 59 (L) 08/08/2018 1504   GFRNONAA 55 (L) 12/24/2014 1457   GFRAA >60 08/08/2018 1504   GFRAA >60 12/24/2014 1457    No results found for: SPEP, UPEP  Lab Results  Component Value Date   WBC 4.5 08/08/2018   NEUTROABS 2.9  08/08/2018   HGB 10.5 (L) 08/08/2018   HCT 33.8 (L) 08/08/2018   MCV 94.2 08/08/2018   PLT 289 08/08/2018      Chemistry      Component Value Date/Time   NA 137 08/08/2018 1504   NA 140 03/01/2018 1432   NA 136 12/24/2014 1457   K 3.9 08/08/2018 1504   K 3.6 12/24/2014 1457   CL 102 08/08/2018 1504   CL 98 (L) 12/24/2014 1457   CO2 29 08/08/2018 1504   CO2 32 12/24/2014 1457   BUN 12 08/08/2018 1504   BUN 20 03/01/2018 1432   BUN 17 12/24/2014 1457   CREATININE 0.92 08/08/2018 1504   CREATININE 1.00 12/24/2014 1457      Component Value Date/Time   CALCIUM 8.4 (L) 08/08/2018 1504   CALCIUM 9.5 12/24/2014 1457   ALKPHOS 76 08/08/2018 1504   ALKPHOS 65 12/24/2014 1457   AST 23 08/08/2018 1504   AST 22 12/24/2014 1457   ALT 13 08/08/2018 1504   ALT 19 12/24/2014 1457   BILITOT 0.6 08/08/2018 1504   BILITOT 0.4 12/24/2014 1457       RADIOGRAPHIC STUDIES: I have personally reviewed the radiological images as listed and agreed with the findings in the report. No results found.   ASSESSMENT & PLAN:  Cancer of hilus of right lung ([place]) # Stage III squamous cell lung cancer s/p chemo-RT- 2016; Recurrent pleural effusion [see discussion below]- October 21 PET scan shows-stable consolidation right upper lung/no significant uptake-suggestive of benign radiation etiology.  Clinically stable; no obvious recurrence of disease.  Continue surveillance.  #Recurrent pleural effusion-status post pleural biopsy negative for malignancy status post pleurodesis.  Pleurx cath catheter explanted on December 12.  We will plan to get a chest x-ray in 2 months.  #Chronic respiratory failure-Home O2 2 L; multifactorial CHF COPD stable  #Mild anemia hemoglobin 10.5-no obvious iron deficiency suspected.  If worse would recommend further work-up.   #Back pain improving s/p kyphoplasty-stable  # CKD-III creatinine 1.37 stable. sec to diuretics.  Stable  # Prosthetic valve/ A. fib on  Eliquis-stable  #Hypokalemia-on potassium-s stable  # DISPOSITION:  # follow up in 2 months-Dr. Corcoran/labs- cbc/bmp/chest x-ray- Dr.B  Cc; Dr.Oaks.    Orders Placed This Encounter  Procedures  . DG Chest 2 View    Standing Status:   Future    Standing Expiration Date:   02/14/2020    Order Specific Question:   Reason for Exam (SYMPTOM  OR DIAGNOSIS REQUIRED)    Answer:   pleural effusion; Lung cancer    Order Specific Question:   Preferred imaging location?    Answer:   [facility]  . CBC with Differential/Platelet    Standing Status:   Future    Standing Expiration Date:   08/16/2019  . Comprehensive metabolic panel    Standing Status:   Future    Standing Expiration Date:   08/16/2019   All questions were answered.  The patient knows to call the clinic with any problems, questions or concerns.      Cammie Sickle, MD 08/15/2018 12:28 PM

## 2018-08-18 ENCOUNTER — Telehealth: Payer: Self-pay

## 2018-08-18 ENCOUNTER — Other Ambulatory Visit: Payer: Self-pay

## 2018-08-18 DIAGNOSIS — J90 Pleural effusion, not elsewhere classified: Secondary | ICD-10-CM

## 2018-08-18 NOTE — Telephone Encounter (Signed)
Placed call to remind patient to have chest xray prior to appointment. Patient verbalized understanding. Order placed 08/18/18.

## 2018-08-21 ENCOUNTER — Other Ambulatory Visit: Payer: Self-pay

## 2018-08-21 ENCOUNTER — Ambulatory Visit
Admission: RE | Admit: 2018-08-21 | Discharge: 2018-08-21 | Disposition: A | Discharge status: Home / Self Care | Payer: Medicare Other | Source: Ambulatory Visit | Attending: Internal Medicine | Admitting: Internal Medicine

## 2018-08-21 ENCOUNTER — Ambulatory Visit
Admission: RE | Admit: 2018-08-21 | Discharge: 2018-08-21 | Disposition: A | Discharge status: Home / Self Care | Payer: Medicare Other | Source: Home / Self Care | Attending: Cardiothoracic Surgery | Admitting: Cardiothoracic Surgery

## 2018-08-21 ENCOUNTER — Encounter: Payer: Self-pay | Admitting: Cardiothoracic Surgery

## 2018-08-21 ENCOUNTER — Observation Stay
Admission: EM | Admit: 2018-08-21 | Discharge: 2018-08-22 | Disposition: A | Discharge status: Home / Self Care | Payer: Medicare Other | Attending: Internal Medicine | Admitting: Internal Medicine

## 2018-08-21 ENCOUNTER — Emergency Department: Payer: Medicare Other

## 2018-08-21 ENCOUNTER — Ambulatory Visit (INDEPENDENT_AMBULATORY_CARE_PROVIDER_SITE_OTHER): Payer: Medicare Other | Admitting: Cardiothoracic Surgery

## 2018-08-21 VITALS — BP 146/75 | HR 69 | Temp 97.3°F | Ht 66.0 in | Wt 154.0 lb

## 2018-08-21 DIAGNOSIS — Z951 Presence of aortocoronary bypass graft: Secondary | ICD-10-CM | POA: Diagnosis not present

## 2018-08-21 DIAGNOSIS — J90 Pleural effusion, not elsewhere classified: Secondary | ICD-10-CM

## 2018-08-21 DIAGNOSIS — I482 Chronic atrial fibrillation, unspecified: Secondary | ICD-10-CM | POA: Insufficient documentation

## 2018-08-21 DIAGNOSIS — Z79891 Long term (current) use of opiate analgesic: Secondary | ICD-10-CM | POA: Insufficient documentation

## 2018-08-21 DIAGNOSIS — C3401 Malignant neoplasm of right main bronchus: Secondary | ICD-10-CM

## 2018-08-21 DIAGNOSIS — Z79899 Other long term (current) drug therapy: Secondary | ICD-10-CM | POA: Insufficient documentation

## 2018-08-21 DIAGNOSIS — Z7901 Long term (current) use of anticoagulants: Secondary | ICD-10-CM | POA: Diagnosis not present

## 2018-08-21 DIAGNOSIS — I252 Old myocardial infarction: Secondary | ICD-10-CM | POA: Insufficient documentation

## 2018-08-21 DIAGNOSIS — I2510 Atherosclerotic heart disease of native coronary artery without angina pectoris: Secondary | ICD-10-CM | POA: Insufficient documentation

## 2018-08-21 DIAGNOSIS — Z87891 Personal history of nicotine dependence: Secondary | ICD-10-CM | POA: Insufficient documentation

## 2018-08-21 DIAGNOSIS — E1140 Type 2 diabetes mellitus with diabetic neuropathy, unspecified: Secondary | ICD-10-CM | POA: Insufficient documentation

## 2018-08-21 DIAGNOSIS — E039 Hypothyroidism, unspecified: Secondary | ICD-10-CM | POA: Insufficient documentation

## 2018-08-21 DIAGNOSIS — Z79890 Hormone replacement therapy (postmenopausal): Secondary | ICD-10-CM | POA: Diagnosis not present

## 2018-08-21 DIAGNOSIS — Z85118 Personal history of other malignant neoplasm of bronchus and lung: Secondary | ICD-10-CM | POA: Insufficient documentation

## 2018-08-21 DIAGNOSIS — J441 Chronic obstructive pulmonary disease with (acute) exacerbation: Principal | ICD-10-CM | POA: Insufficient documentation

## 2018-08-21 DIAGNOSIS — I25118 Atherosclerotic heart disease of native coronary artery with other forms of angina pectoris: Secondary | ICD-10-CM

## 2018-08-21 DIAGNOSIS — I480 Paroxysmal atrial fibrillation: Secondary | ICD-10-CM | POA: Insufficient documentation

## 2018-08-21 DIAGNOSIS — I509 Heart failure, unspecified: Secondary | ICD-10-CM

## 2018-08-21 DIAGNOSIS — K219 Gastro-esophageal reflux disease without esophagitis: Secondary | ICD-10-CM | POA: Insufficient documentation

## 2018-08-21 DIAGNOSIS — J9621 Acute and chronic respiratory failure with hypoxia: Secondary | ICD-10-CM | POA: Diagnosis not present

## 2018-08-21 DIAGNOSIS — E785 Hyperlipidemia, unspecified: Secondary | ICD-10-CM | POA: Diagnosis not present

## 2018-08-21 DIAGNOSIS — Z952 Presence of prosthetic heart valve: Secondary | ICD-10-CM | POA: Insufficient documentation

## 2018-08-21 DIAGNOSIS — I5023 Acute on chronic systolic (congestive) heart failure: Secondary | ICD-10-CM | POA: Insufficient documentation

## 2018-08-21 DIAGNOSIS — R0602 Shortness of breath: Secondary | ICD-10-CM | POA: Diagnosis present

## 2018-08-21 DIAGNOSIS — I110 Hypertensive heart disease with heart failure: Secondary | ICD-10-CM | POA: Diagnosis not present

## 2018-08-21 DIAGNOSIS — J810 Acute pulmonary edema: Secondary | ICD-10-CM

## 2018-08-21 HISTORY — DX: Acute and chronic respiratory failure with hypoxia: J96.21

## 2018-08-21 LAB — CBC WITH DIFFERENTIAL/PLATELET
ABS IMMATURE GRANULOCYTES: 0.02 10*3/uL (ref 0.00–0.07)
BASOS PCT: 0 %
Basophils Absolute: 0 10*3/uL (ref 0.0–0.1)
Eosinophils Absolute: 0.2 10*3/uL (ref 0.0–0.5)
Eosinophils Relative: 3 %
HCT: 34.1 % — ABNORMAL LOW (ref 36.0–46.0)
HEMOGLOBIN: 10.3 g/dL — AB (ref 12.0–15.0)
Immature Granulocytes: 0 %
Lymphocytes Relative: 10 %
Lymphs Abs: 0.5 10*3/uL — ABNORMAL LOW (ref 0.7–4.0)
MCH: 28.9 pg (ref 26.0–34.0)
MCHC: 30.2 g/dL (ref 30.0–36.0)
MCV: 95.5 fL (ref 80.0–100.0)
Monocytes Absolute: 0.6 10*3/uL (ref 0.1–1.0)
Monocytes Relative: 11 %
Neutro Abs: 4.2 10*3/uL (ref 1.7–7.7)
Neutrophils Relative %: 76 %
Platelets: 260 10*3/uL (ref 150–400)
RBC: 3.57 MIL/uL — ABNORMAL LOW (ref 3.87–5.11)
RDW: 16.1 % — ABNORMAL HIGH (ref 11.5–15.5)
Smear Review: NORMAL
WBC: 5.6 10*3/uL (ref 4.0–10.5)
nRBC: 0 % (ref 0.0–0.2)

## 2018-08-21 LAB — BASIC METABOLIC PANEL
Anion gap: 7 (ref 5–15)
BUN: 12 mg/dL (ref 8–23)
CO2: 29 mmol/L (ref 22–32)
CREATININE: 0.87 mg/dL (ref 0.44–1.00)
Calcium: 8.6 mg/dL — ABNORMAL LOW (ref 8.9–10.3)
Chloride: 100 mmol/L (ref 98–111)
GFR calc Af Amer: >60 mL/min (ref >60)
GFR calc non Af Amer: >60 mL/min (ref >60)
Glucose, Bld: 99 mg/dL (ref 70–99)
Potassium: 3.9 mmol/L (ref 3.5–5.1)
Sodium: 136 mmol/L (ref 135–145)

## 2018-08-21 LAB — BRAIN NATRIURETIC PEPTIDE: B Natriuretic Peptide: 778 pg/mL — ABNORMAL HIGH (ref 0.0–100.0)

## 2018-08-21 LAB — INFLUENZA PANEL BY PCR (TYPE A & B)
INFLBPCR: NEGATIVE
Influenza A By PCR: NEGATIVE

## 2018-08-21 LAB — TROPONIN I: Troponin I: <0.03 ng/mL (ref <0.03)

## 2018-08-21 MED ORDER — ROSUVASTATIN CALCIUM 5 MG PO TABS
5.0000 mg | ORAL_TABLET | Freq: Every day | ORAL | Status: DC
  Administered 2018-08-21: 5 mg via ORAL
  Filled 2018-08-21: qty 1

## 2018-08-21 MED ORDER — METHYLPREDNISOLONE SODIUM SUCC 125 MG IJ SOLR
125.0000 mg | Freq: Once | INTRAMUSCULAR | Status: AC
  Administered 2018-08-21: 125 mg via INTRAVENOUS
  Filled 2018-08-21: qty 2

## 2018-08-21 MED ORDER — METOPROLOL SUCCINATE ER 25 MG PO TB24
25.0000 mg | ORAL_TABLET | Freq: Every morning | ORAL | Status: DC
  Administered 2018-08-22: 25 mg via ORAL
  Filled 2018-08-21: qty 1

## 2018-08-21 MED ORDER — AMIODARONE HCL 200 MG PO TABS
200.0000 mg | ORAL_TABLET | Freq: Every day | ORAL | Status: DC
  Administered 2018-08-22: 200 mg via ORAL
  Filled 2018-08-21: qty 1

## 2018-08-21 MED ORDER — SODIUM CHLORIDE 0.9 % IV SOLN
500.0000 mg | Freq: Once | INTRAVENOUS | Status: AC
  Administered 2018-08-21: 500 mg via INTRAVENOUS
  Filled 2018-08-21: qty 500

## 2018-08-21 MED ORDER — ORAL CARE MOUTH RINSE: 15.0000 mL | Freq: Two times a day (BID) | OROMUCOSAL | Status: DC

## 2018-08-21 MED ORDER — ONDANSETRON HCL 4 MG/2ML IJ SOLN: 4.0000 mg | Freq: Four times a day (QID) | INTRAMUSCULAR | Status: DC | PRN

## 2018-08-21 MED ORDER — FUROSEMIDE 10 MG/ML IJ SOLN
20.0000 mg | Freq: Once | INTRAMUSCULAR | Status: AC
  Administered 2018-08-21: 20 mg via INTRAVENOUS
  Filled 2018-08-21: qty 4

## 2018-08-21 MED ORDER — BUDESONIDE 0.5 MG/2ML IN SUSP
0.5000 mg | Freq: Two times a day (BID) | RESPIRATORY_TRACT | Status: DC
  Administered 2018-08-21 – 2018-08-22 (×2): 0.5 mg via RESPIRATORY_TRACT
  Filled 2018-08-21 (×2): qty 2

## 2018-08-21 MED ORDER — POLYETHYLENE GLYCOL 3350 17 G PO PACK: 17.0000 g | PACK | Freq: Every day | ORAL | Status: DC | PRN

## 2018-08-21 MED ORDER — REVEFENACIN 175 MCG/3ML IN SOLN: 3.0000 mL | Freq: Every day | RESPIRATORY_TRACT | Status: DC

## 2018-08-21 MED ORDER — SACUBITRIL-VALSARTAN 24-26 MG PO TABS
1.0000 | ORAL_TABLET | Freq: Two times a day (BID) | ORAL | Status: DC
  Administered 2018-08-21 – 2018-08-22 (×2): 1 via ORAL
  Filled 2018-08-21 (×2): qty 1

## 2018-08-21 MED ORDER — FUROSEMIDE 10 MG/ML IJ SOLN
20.0000 mg | Freq: Every day | INTRAMUSCULAR | Status: DC
  Administered 2018-08-22: 20 mg via INTRAVENOUS
  Filled 2018-08-21: qty 2

## 2018-08-21 MED ORDER — LORATADINE 10 MG PO TABS
10.0000 mg | ORAL_TABLET | Freq: Every day | ORAL | Status: DC
  Administered 2018-08-22: 10 mg via ORAL
  Filled 2018-08-21: qty 1

## 2018-08-21 MED ORDER — BUDESONIDE 0.5 MG/2ML IN SUSP: 0.5000 mg | Freq: Two times a day (BID) | RESPIRATORY_TRACT | Status: DC

## 2018-08-21 MED ORDER — METOPROLOL SUCCINATE ER 50 MG PO TB24
50.0000 mg | ORAL_TABLET | Freq: Every day | ORAL | Status: DC
  Administered 2018-08-21: 50 mg via ORAL
  Filled 2018-08-21: qty 1

## 2018-08-21 MED ORDER — IPRATROPIUM-ALBUTEROL 0.5-2.5 (3) MG/3ML IN SOLN
3.0000 mL | Freq: Four times a day (QID) | RESPIRATORY_TRACT | Status: DC
  Administered 2018-08-21 – 2018-08-22 (×3): 3 mL via RESPIRATORY_TRACT
  Filled 2018-08-21 (×3): qty 3

## 2018-08-21 MED ORDER — ACETAMINOPHEN 325 MG PO TABS: 650.0000 mg | ORAL_TABLET | Freq: Four times a day (QID) | ORAL | Status: DC | PRN

## 2018-08-21 MED ORDER — METHYLPREDNISOLONE SODIUM SUCC 125 MG IJ SOLR
60.0000 mg | Freq: Two times a day (BID) | INTRAMUSCULAR | Status: DC
  Administered 2018-08-22: 60 mg via INTRAVENOUS
  Filled 2018-08-21: qty 2

## 2018-08-21 MED ORDER — LEVOTHYROXINE SODIUM 88 MCG PO TABS
88.0000 ug | ORAL_TABLET | Freq: Every day | ORAL | Status: DC
  Administered 2018-08-22: 88 ug via ORAL
  Filled 2018-08-21: qty 1

## 2018-08-21 MED ORDER — IPRATROPIUM-ALBUTEROL 0.5-2.5 (3) MG/3ML IN SOLN
9.0000 mL | Freq: Once | RESPIRATORY_TRACT | Status: AC
  Administered 2018-08-21: 9 mL via RESPIRATORY_TRACT
  Filled 2018-08-21: qty 9

## 2018-08-21 MED ORDER — ACETAMINOPHEN 650 MG RE SUPP: 650.0000 mg | Freq: Four times a day (QID) | RECTAL | Status: DC | PRN

## 2018-08-21 MED ORDER — APIXABAN 5 MG PO TABS
5.0000 mg | ORAL_TABLET | Freq: Two times a day (BID) | ORAL | Status: DC
  Administered 2018-08-21 – 2018-08-22 (×2): 5 mg via ORAL
  Filled 2018-08-21 (×2): qty 1

## 2018-08-21 MED ORDER — ONDANSETRON HCL 4 MG PO TABS: 4.0000 mg | ORAL_TABLET | Freq: Four times a day (QID) | ORAL | Status: DC | PRN

## 2018-08-21 NOTE — Progress Notes (Signed)
  Patient ID: Jenny Cooper, female   DOB: December 16, 1935, 82 y.o.   MRN: [id number]  HISTORY: [name] returns today in follow-up.  She states that over the last several days she has been increasingly more short of breath.  When she is at rest she seems to be okay but with any exertion she feels markedly dyspneic.  She denies any fevers or chills but does think that her voice is a little more raspy than usual.   Vitals:   08/21/18 0932 08/21/18 0944  BP: (!) 146/75   Pulse: 69   Temp: (!) 97.3 F (36.3 C)   SpO2: (!) 89% 94%     EXAM:    Resp: Lungs show bibasilar rales right greater than left.  No respiratory distress, normal effort. Heart:  Regular without murmurs Abd:  Abdomen is soft, non distended and non tender. No masses are palpable.  There is no rebound and no guarding.  Neurological: Alert and oriented to person, place, and time. Coordination normal.  Skin: Skin is warm and dry. No rash noted. No diaphoretic. No erythema. No pallor. The Pleurx catheter site is clean dry and intact. Psychiatric: Normal mood and affect. Normal behavior. Judgment and thought content normal.    ASSESSMENT: We did remove the single stitch for her Pleurx catheter site.  Her chest x-ray today shows what appears to be increasing pulmonary edema.  PLAN:   I explained to the patient that her oxygen saturations in the 92 to 93% range at rest were okay but with activity is a dip down into the 70s and 80s.  I encouraged her to go straight to the emergency department for further evaluation of her presumed heart failure.  She and her daughter have agreed to do so.  We did not make a return visit for her but we will see her back as needed.  She will follow-up with Dr. Lynett Fish in 1 month.    [name], MD

## 2018-08-21 NOTE — ED Triage Notes (Signed)
Pt presenting to triage with shortness of breath  - pt went to [facility] this am for pleurodesis post op follow up  Pt reports increased shortness of breath for the last three days  Daughter at bedside reports pt was taken off her diuretic approx 4 weeks ago due to low bp

## 2018-08-21 NOTE — ED Notes (Signed)
Patient ambulatory down hallway with continuous pulse ox. First half of trip on 2L [place], patient desat to 85%. Oxygen increased to 4L [place] with ambulatory pulse ox of 88%. Patient complaining of increased WOB with ambulation. Patient returned to bed and placed on cardiac monitor.

## 2018-08-21 NOTE — Patient Instructions (Signed)
Report to the ER today so they may assess your low oxygen levels.   Follow up here as needed.

## 2018-08-21 NOTE — Progress Notes (Signed)
Family Meeting Note  Advance Directive:yes  Today a meeting took place with the Patient and daughter.  Patient is able to participate.  The following clinical team members were present during this meeting:MD  The following were discussed:Patient's diagnosis: , Patient's progosis: Unable to determine and Goals for treatment: Full Code   Patient knows that she has multiple lung problems, including COPD, radiation-induced fibrosis, and history of lung cancer. She states that she feels pretty good and would like to remain full code at this time. Daughter in agreement.  Additional follow-up to be provided: prn  Time spent during discussion:20 minutes  Jenny Doffing, MD

## 2018-08-21 NOTE — H&P (Signed)
[facility] [place] NAME: Jenny Cooper    MR#:  [id number]  DATE OF BIRTH:  1936-01-27  DATE OF ADMISSION:  08/21/2018  PRIMARY CARE PHYSICIAN: Glean Hess, MD   REQUESTING/REFERRING PHYSICIAN: Larae Grooms, MD  CHIEF COMPLAINT:   Chief Complaint  Patient presents with  . Shortness of Breath    HISTORY OF PRESENT ILLNESS:  Jenny Cooper  is a 82 y.o. female with a known history of chronic hypoxic respiratory failure secondary to COPD and radiation fibrosis, history of right lung carcinoma with recurrent pleural effusions, chronic systolic heart failure, CAD status post CABG 2002, history of atrial fibrillation, hypertension, hyperlipidemia, hypothyroidism who presented to the ED from the cardiothoracic surgery office with shortness of breath and hypoxia.  He has had shortness of breath over the last 2 to 3 days.  The shortness of breath is worse with exertion.  She also notes orthopnea and productive cough.  She has had some mild lower extremity edema over the last couple of days.  She has been off Lasix since November due to low blood pressures.  She was seen this morning in the cardiothoracic surgery office and was noted to have hypoxia down to the 70s and 80s with ambulation.  She was sent to the ED for further evaluation.  In the ED, labs are significant for BNP 778.  Chest x-ray with bilateral interstitial fibrosis that is increased on the right, with questionable interstitial pulmonary edema.  She was given IV Lasix and IV Solu-Medrol for presumed CHF and COPD exacerbations.  She had desaturations to 85% on her home 2 L of oxygen.  Hospitalists were called for admission.  PAST MEDICAL HISTORY:   Past Medical History:  Diagnosis Date  . Acute respiratory failure with hypoxia ([place]) 12/11/2017  . Atypical atrial flutter ([place])    a. s/p ablation 07/27/2013 followed by Dr. Rockey Situ  . Balance problem   . CAD (coronary artery  disease)    a. s/p MI x 2 in 2002 s/p PCI x 2 in 2002; b. s/p 2v CABG 2002; c. stress echo 07/2004 w/ evi of pos & inf infarct & no evi of ischemia; d. 4/08 dipyridamole scan w/ multiple areas of infarct, no ischemia, EF 49%; e. cath 04/28/15 3v CAD, med Rx rec, no targets for revasc, LM lum irregs, pLAD 30%, 100%, ost-pLCx 60%, [name] 99%, OM2 100%, p-mRCA 90%, [name] 100% L-R collats, VG-mLAD irregs, VG-OM2 oc  . Carcinoma of right lung ([place]) 01/03/2015   a. followed by Dr. Oliva Bustard  . Chronic systolic CHF (congestive heart failure) ([place])    a. echo 03/2015: EF 30-35%, sev ant/inf/pos HK, in mild to mod MR  . Complication of anesthesia    more recently patient oxygen levels do not rebound as quickly  . Compressed spine fracture ([place]) 08/06/2016   lumbar 2, t11, t12  . COPD (chronic obstructive pulmonary disease) ([place])   . Fractured pelvis ([place]) 05/26/2016   2 places  . GERD (gastroesophageal reflux disease)   . History of blood clots    12/2001 left leg  . History of colonoscopy 2013  . History of mammography, screening 2015  . History of Papanicolaou smear of cervix 2013  . HLD (hyperlipidemia)   . HTN (hypertension)   . Hypothyroidism   . Lung cancer ([place])   . Mitral regurgitation    a. s/p mitral ring placement 09/2000; b. echo 09/2010: EF 50%, inf HK, post AK, mild  MR, prosthetic mitral valve ring w/ peak gradient of 10 mmHg; b. echo 2/13: EF 50%, mild MR/TR     . Myocardial infarction ([place])    X 2 (LAST ONE IN 2002)  . Neuropathy   . Neuropathy   . Pacemaker    a. MDT 2002; b. generator replacement 2013; c. followed by Dr. Omelia Blackwater, MD  . PAF (paroxysmal atrial fibrillation) ([place])    a. on Eliquis   . Personal history of chemotherapy   . Personal history of radiation therapy   . Pneumonia     PAST SURGICAL HISTORY:   Past Surgical History:  Procedure Laterality Date  . ABLATION  04/2016   [facility]  . APPENDECTOMY    . CARDIAC CATHETERIZATION N/A 04/28/2015   Procedure: Left Heart  Cath and Coronary Angiography;  Surgeon: Wellington Hampshire, MD;  Location: [facility];  Service: Cardiovascular;  Laterality: N/A;  . CARDIOVERSION N/A 04/08/2017   Procedure: CARDIOVERSION;  Surgeon: Wellington Hampshire, MD;  Location: [facility];  Service: Cardiovascular;  Laterality: N/A;  . CHEST TUBE INSERTION N/A 07/11/2018   Procedure: PLEURX CATH INSERTION;  Surgeon: [name], MD;  Location: [facility];  Service: Thoracic;  Laterality: N/A;  . COLONOSCOPY  12/2009   2 small tubular adenomas  . CORONARY ARTERY BYPASS GRAFT  09/2000  . DRAIN REMOVAL Right 08/10/2018   Procedure: DRAIN REMOVAL;  Surgeon: [name], MD;  Location: [facility];  Service: General;  Laterality: Right;  . ECTOPIC PREGNANCY SURGERY    . ELECTROMAGNETIC NAVIGATION BROCHOSCOPY N/A 10/06/2015   Procedure: ELECTROMAGNETIC NAVIGATION BRONCHOSCOPY;  Surgeon: Flora Lipps, MD;  Location: [facility];  Service: Cardiopulmonary;  Laterality: N/A;  . ENDOBRONCHIAL ULTRASOUND N/A 10/06/2015   Procedure: ENDOBRONCHIAL ULTRASOUND;  Surgeon: Flora Lipps, MD;  Location: [facility];  Service: Cardiopulmonary;  Laterality: N/A;  . FLEXIBLE BRONCHOSCOPY Right 07/11/2018   Procedure: FLEXIBLE BRONCHOSCOPY;  Surgeon: [name], MD;  Location: [facility];  Service: Thoracic;  Laterality: Right;  . KYPHOPLASTY N/A 09/28/2016   Procedure: KYPHOPLASTY;  Surgeon: Hessie Knows, MD;  Location: [facility];  Service: Orthopedics;  Laterality: N/A;  . KYPHOPLASTY N/A 02/20/2018   Procedure: [id number];  Surgeon: Hessie Knows, MD;  Location: [facility];  Service: Orthopedics;  Laterality: N/A;  . PACEMAKER INSERTION  03/2012  . thorocentesis  12/24/2016  . VAGINAL HYSTERECTOMY     partial - left ovary remains  . VIDEO ASSISTED THORACOSCOPY Right 07/11/2018   Procedure: VIDEO ASSISTED THORACOSCOPY;  Surgeon: [name], MD;  Location: [facility];  Service: Thoracic;  Laterality: Right;  Marland Kitchen VIDEO ASSISTED THORACOSCOPY (VATS) W/TALC PLEUADESIS  Right 07/11/2018   Procedure: THORACOTOMY PLEURAL BIOPSY WITH TALC PLEURODESIS;  Surgeon: [name], MD;  Location: [facility];  Service: Thoracic;  Laterality: Right;  pleural biopsy    SOCIAL HISTORY:   Social History   Tobacco Use  . Smoking status: Former Smoker    Packs/day: 1.00    Years: 40.00    Pack years: 40.00    Types: Cigarettes    Last attempt to quit: 08/30/2000    Years since quitting: 17.9  . Smokeless tobacco: Never Used  . Tobacco comment: quit smoking in 08/28/2000. Smoking cessation materials not required  Substance Use Topics  . Alcohol use: Not Currently    Alcohol/week: 10.0 standard drinks    Types: 10 Glasses of wine per week    Comment: 2 glasses of wine per day    FAMILY HISTORY:   Family History  Problem Relation Age of Onset  . COPD Mother        sister, and brother  . Lung disease Father   . Stroke Maternal Grandmother   . Hypertension Sister   . COPD Sister   . Hypertension Brother   . COPD Brother   . Colon cancer Brother   . Heart attack Neg Hx   . Breast cancer Neg Hx     DRUG ALLERGIES:   Allergies  Allergen Reactions  . Lovenox [Enoxaparin Sodium] Itching    REVIEW OF SYSTEMS:   Review of Systems  Constitutional: Negative for chills and fever.  HENT: Negative for congestion and sore throat.   Eyes: Negative for blurred vision and double vision.  Respiratory: Positive for cough, sputum production, shortness of breath and wheezing.   Cardiovascular: Positive for orthopnea and leg swelling. Negative for chest pain and palpitations.  Gastrointestinal: Negative for abdominal pain, nausea and vomiting.  Genitourinary: Negative for dysuria and urgency.  Musculoskeletal: Negative for back pain and neck pain.  Neurological: Negative for dizziness and headaches.  Psychiatric/Behavioral: Negative for depression. The patient is not nervous/anxious.     MEDICATIONS AT HOME:   Prior to Admission medications   Medication Sig  Start Date End Date Taking? Authorizing Provider  acetaminophen (TYLENOL) 500 MG tablet Take 1,000 mg by mouth every 6 (six) hours as needed for mild pain or moderate pain.    Yes [provider]  amiodarone (PACERONE) 200 MG tablet Take 1 tablet (200 mg total) by mouth daily. 04/27/18  Yes Gollan, Kathlene November, MD  budesonide (PULMICORT) 0.5 MG/2ML nebulizer solution Take 2 mLs (0.5 mg total) by nebulization 2 (two) times daily. 05/12/18 05/12/19 Yes Kasa, Maretta Bees, MD  Calcium Carb-Cholecalciferol (CALCIUM 600-D PO) Take 1 tablet by mouth daily.    Yes [provider]  cetirizine (ZYRTEC) 10 MG tablet TAKE (1) TABLET BY MOUTH EVERY DAY Patient taking differently: Take 10 mg by mouth daily.  06/15/18  Yes Glean Hess, MD  ELIQUIS 5 MG TABS tablet TAKE (1) TABLET BY MOUTH TWICE DAILY Patient taking differently: Take 5 mg by mouth 2 (two) times daily.  02/13/18  Yes Minna Merritts, MD  levothyroxine (SYNTHROID, LEVOTHROID) 88 MCG tablet Take 1 tablet (88 mcg total) by mouth daily before breakfast. 12/20/17  Yes Glean Hess, MD  metoprolol succinate (TOPROL-XL) 25 MG 24 hr tablet TAKE ONE TABLET BY MOUTH EVERY MORNING AND TAKE TWO TABLETS BY MOUTH EVERY EVENING Patient taking differently: Take 25-50 mg by mouth See admin instructions. Take 25 mg by mouth in the morning and 50 mg in the evening 07/24/18  Yes Gollan, Kathlene November, MD  Multiple Vitamins-Minerals (CENTRUM SILVER PO) Take 1 tablet by mouth daily.   Yes [provider]  Revefenacin (YUPELRI) 175 MCG/3ML SOLN Inhale 3 mLs into the lungs daily. DX: J76.77 05/12/18  Yes Flora Lipps, MD  rosuvastatin (CRESTOR) 5 MG tablet Take 1 tablet (5 mg total) by mouth daily. Patient taking differently: Take 5 mg by mouth at bedtime.  10/19/17  Yes Gollan, Kathlene November, MD  sacubitril-valsartan (ENTRESTO) 24-26 MG Take 1 tablet by mouth 2 (two) times daily.   Yes [provider]  senna (SENOKOT) 8.6 MG TABS tablet Take 1  tablet (8.6 mg total) by mouth daily. Patient taking differently: Take 1 tablet by mouth 2 (two) times daily.  05/27/16  Yes Veronese, [place], MD  oxyCODONE (OXY IR/ROXICODONE) 5 MG immediate release tablet Take 1 tablet (5 mg  total) by mouth every 4 (four) hours as needed for severe pain or breakthrough pain. Patient not taking: Reported on 08/21/2018 07/13/18   Tylene Fantasia, PA-C      VITAL SIGNS:  Blood pressure 115/63, pulse 60, temperature (!) 97.3 F (36.3 C), resp. rate 19, height _0  (1.676 m), weight 69.8 kg, SpO2 94 %.  PHYSICAL EXAMINATION:  Physical Exam  GENERAL:  82 y.o.-year-old patient lying in the bed with no acute distress.  EYES: Pupils equal, round, reactive to light and accommodation. No scleral icterus. Extraocular muscles intact.  HEENT: Head atraumatic, normocephalic. Oropharynx and nasopharynx clear.  NECK:  Supple, no jugular venous distention. No thyroid enlargement, no tenderness.  LUNGS: + Mild diffuse expiratory wheezing normal breath sounds, no rales,rhonchi or crepitation.  Decreased breath sounds throughout the right upper lung fields.  No use of accessory muscles of respiration.  Nasal cannula in place. CARDIOVASCULAR: RRR, S1, S2 normal. No murmurs, rubs, or gallops.  ABDOMEN: Soft, nontender, nondistended. Bowel sounds present. No organomegaly or mass.  EXTREMITIES: No cyanosis or clubbing. 1+ lower extremity edema bilaterally. NEUROLOGIC: Cranial nerves II through XII are intact. +global weakness. Sensation intact. Gait not checked.  PSYCHIATRIC: The patient is alert and oriented x 3.  SKIN: No obvious rash, lesion, or ulcer.   LABORATORY PANEL:   CBC Recent Labs  Lab 08/21/18 1224  WBC 5.6  HGB 10.3*  HCT 34.1*  PLT 260   ------------------------------------------------------------------------------------------------------------------  Chemistries  Recent Labs  Lab 08/21/18 1224  NA 136  K 3.9  CL 100  CO2 29  GLUCOSE 99    BUN 12  CREATININE 0.87  CALCIUM 8.6*   ------------------------------------------------------------------------------------------------------------------  Cardiac Enzymes Recent Labs  Lab 08/21/18 1224  TROPONINI <0.03   ------------------------------------------------------------------------------------------------------------------  RADIOLOGY:  Dg Chest 2 View  Result Date: 08/21/2018 CLINICAL DATA:  Cough and shortness of breath. History of lung carcinoma and pleural effusion EXAM: CHEST - 2 VIEW COMPARISON:  August 07, 2018 FINDINGS: There is diffuse fibrotic change throughout the lungs with an increase compared to recent study on the right. There is a small right pleural effusion. There is atelectatic change and scarring in the right mid lung. Heart is upper normal in size with pulmonary vascularity normal. Patient is status post mitral valve replacement and coronary artery bypass grafting. Pacemaker leads are attached to the right atrium and middle cardiac vein. No pneumothorax. Patient has had kyphoplasty procedures in the lower thoracic and upper lumbar regions. There is aortic atherosclerosis. Port-A-Cath tip is in the superior vena cava. IMPRESSION: Interstitial fibrosis bilaterally, stable in the left base and increased overall on the right. Question a degree of interstitial pulmonary edema versus lymphangitic spread of tumor on the right. There is atelectatic change in scarring in the right mid lung with volume loss right apex. No frank consolidation. Stable cardiac silhouette. No adenopathy appreciable by radiography. There is aortic atherosclerosis. Stable postoperative changes. Port-A-Cath tip in superior vena cava. Aortic Atherosclerosis (ICD10-I70.0). Electronically Signed   By: Lowella Grip III M.D.   On: 08/21/2018 09:04      IMPRESSION AND PLAN:   Acute on chronic hypoxic respiratory failure-likely secondary to combination of CHF and COPD exacerbations.  Uses 2 L  O2 at home, but requiring 4 L here. -Check flu test -Continue home inhalers and duonebs as needed -Wean oxygen to 2L as tolerated -Ambulate with pulse ox prior to discharge  Acute on chronic systolic heart failure-appears mildly volume overloaded.  Chest x-ray with some  interstitial pulmonary edema. Last ECHO with EF 30-35%. Follows with Dr. Rockey Situ as an outpatient. -Continue Lasix 20 mg IV daily -Continue home metoprolol and entresto -Strict I/O -Daily weights  Acute exacerbation of COPD- has wheezing on exam. -Continue IV solumedrol -Unable to tolerate prednisone, as this always makes her a-fib worse -Duonebs prn -Continue home inhalers  History of right lung carcinoma with recurrent pleural effusions and radiation fibrosis- recently had pleurex catheter, which was removed 12/12. Follows with Dr. Rogue Bussing as an outpatient. -Monitor -Needs f/u with oncology on discharge  CAD status post CABG 2002- stable, no active chest pain. -Continue home metoprolol   History of atrial fibrillation- in NSR here -Continue amiodarone, metoprolol, and Eliquis  Hypertension- normotensive in the ED -Continue home BP meds  Hyperlipidemia- stable -Continue home Crestor  Hypothyroidism- stable -Continue home Synthroid  All the records are reviewed and case discussed with ED provider. Management plans discussed with the patient, family and they are in agreement.  CODE STATUS: Full  TOTAL TIME TAKING CARE OF THIS PATIENT: 45 minutes.    Berna Spare Denys Labree M.D on 08/21/2018 at 4:54 PM  Between 7am to 6pm - Pager - [phone]  After 6pm go to [email or web address] - Proofreader  [facility]  Office  [phone]  CC: Primary care physician; Glean Hess, MD   Note: This dictation was prepared with Dragon dictation along with smaller phrase technology. Any transcriptional errors that result from this process are unintentional.

## 2018-08-21 NOTE — ED Provider Notes (Signed)
[facility] Emergency Department Provider Note  ____________________________________________   First MD Initiated Contact with Patient 08/21/18 1125     (approximate)  I have reviewed the triage vital signs and the nursing notes.   HISTORY  Chief Complaint Shortness of Breath   HPI Jenny Cooper is a 82 y.o. female with a history of atrial flutter, CAD, CHF as well as COPD on 2 L of home oxygen who is presenting to the emergency department with worsening shortness of breath as well as cough and runny nose over the past 4 days.  No fever.  No body aches reported.  Was sent to the emergency department from her thoracic surgeons office this morning secondary to low oxygen saturations as well as possible CHF on her chest x-ray.  Patient says that she is off diuretic now because of intolerance to it previously with low blood pressure.  Patient also states she has a history of lung cancer but in remission since 2016.  Patient on Eliquis as well as amiodarone.  Past Medical History:  Diagnosis Date  . Acute respiratory failure with hypoxia ([place]) 12/11/2017  . Atypical atrial flutter ([place])    a. s/p ablation 07/27/2013 followed by Dr. Rockey Situ  . Balance problem   . CAD (coronary artery disease)    a. s/p MI x 2 in 2002 s/p PCI x 2 in 2002; b. s/p 2v CABG 2002; c. stress echo 07/2004 w/ evi of pos & inf infarct & no evi of ischemia; d. 4/08 dipyridamole scan w/ multiple areas of infarct, no ischemia, EF 49%; e. cath 04/28/15 3v CAD, med Rx rec, no targets for revasc, LM lum irregs, pLAD 30%, 100%, ost-pLCx 60%, [name] 99%, OM2 100%, p-mRCA 90%, [name] 100% L-R collats, VG-mLAD irregs, VG-OM2 oc  . Carcinoma of right lung ([place]) 01/03/2015   a. followed by Dr. Oliva Bustard  . Chronic systolic CHF (congestive heart failure) ([place])    a. echo 03/2015: EF 30-35%, sev ant/inf/pos HK, in mild to mod MR  . Complication of anesthesia    more recently patient oxygen levels do not rebound  as quickly  . Compressed spine fracture ([place]) 08/06/2016   lumbar 2, t11, t12  . COPD (chronic obstructive pulmonary disease) ([place])   . Fractured pelvis ([place]) 05/26/2016   2 places  . GERD (gastroesophageal reflux disease)   . History of blood clots    12/2001 left leg  . History of colonoscopy 2013  . History of mammography, screening 2015  . History of Papanicolaou smear of cervix 2013  . HLD (hyperlipidemia)   . HTN (hypertension)   . Hypothyroidism   . Lung cancer ([place])   . Mitral regurgitation    a. s/p mitral ring placement 09/2000; b. echo 09/2010: EF 50%, inf HK, post AK, mild MR, prosthetic mitral valve ring w/ peak gradient of 10 mmHg; b. echo 2/13: EF 50%, mild MR/TR     . Myocardial infarction ([place])    X 2 (LAST ONE IN 2002)  . Neuropathy   . Neuropathy   . Pacemaker    a. MDT 2002; b. generator replacement 2013; c. followed by Dr. Omelia Blackwater, MD  . PAF (paroxysmal atrial fibrillation) ([place])    a. on Eliquis   . Personal history of chemotherapy   . Personal history of radiation therapy   . Pneumonia     Patient Active Problem List   Diagnosis Date Noted  . Recurrent pleural effusion on right 07/11/2018  . Environmental  and seasonal allergies 12/20/2017  . Muscle spasms of neck 12/20/2017  . Muscle ache 09/15/2017  . Coronary artery disease of bypass graft of native heart with stable angina pectoris ([place]) 04/21/2017  . Chronic systolic heart failure ([place]) 04/14/2017  . Sinus node dysfunction ([place]) 04/06/2017  . Back pain of thoracolumbar region 03/15/2017  . Paroxysmal atrial fibrillation ([place]) 03/05/2017  . Falls frequently 03/05/2017  . Pleural effusion 12/23/2016  . Cancer of hilus of right lung ([place]) 12/14/2016  . Compression fracture of L2 lumbar vertebra ([place]) 08/31/2016  . Acute midline low back pain without sciatica 08/13/2016  . Fracture of multiple pubic rami, right, closed, initial encounter ([place]) 05/30/2016  . Cardiomyopathy ([place]) 04/29/2016  . Type 2  diabetes mellitus with hemoglobin A1c goal of less than 7.0% ([place]) 11/17/2015  . Hx of adenomatous colonic polyps 11/17/2015  . Radiation pneumonitis ([place]) 10/21/2015  . Mitral regurgitation   . HLD (hyperlipidemia)   . Paroxysmal supraventricular tachycardia ([place])   . NSTEMI (non-ST elevated myocardial infarction) ([place])   . Arteriosclerosis of coronary artery 01/11/2015  . Hypothyroidism (acquired) 01/11/2015  . Disorder of peripheral nervous system 10/04/2014  . Cervical radiculopathy, chronic 10/04/2014  . Lung cancer ([place]) 10/04/2014  . Atrial flutter ([place]) 11/16/2013  . Chronic obstructive pulmonary disease ([place]) 04/24/2012  . Hypertension 08/03/2011  . Pacemaker 08/03/2011    Past Surgical History:  Procedure Laterality Date  . ABLATION  04/2016   [facility]  . APPENDECTOMY    . CARDIAC CATHETERIZATION N/A 04/28/2015   Procedure: Left Heart Cath and Coronary Angiography;  Surgeon: Wellington Hampshire, MD;  Location: [facility];  Service: Cardiovascular;  Laterality: N/A;  . CARDIOVERSION N/A 04/08/2017   Procedure: CARDIOVERSION;  Surgeon: Wellington Hampshire, MD;  Location: [facility];  Service: Cardiovascular;  Laterality: N/A;  . CHEST TUBE INSERTION N/A 07/11/2018   Procedure: PLEURX CATH INSERTION;  Surgeon: [name], MD;  Location: [facility];  Service: Thoracic;  Laterality: N/A;  . COLONOSCOPY  12/2009   2 small tubular adenomas  . CORONARY ARTERY BYPASS GRAFT  09/2000  . DRAIN REMOVAL Right 08/10/2018   Procedure: DRAIN REMOVAL;  Surgeon: [name], MD;  Location: [facility];  Service: General;  Laterality: Right;  . ECTOPIC PREGNANCY SURGERY    . ELECTROMAGNETIC NAVIGATION BROCHOSCOPY N/A 10/06/2015   Procedure: ELECTROMAGNETIC NAVIGATION BRONCHOSCOPY;  Surgeon: Flora Lipps, MD;  Location: [facility];  Service: Cardiopulmonary;  Laterality: N/A;  . ENDOBRONCHIAL ULTRASOUND N/A 10/06/2015   Procedure: ENDOBRONCHIAL ULTRASOUND;  Surgeon: Flora Lipps, MD;  Location: [facility]  ORS;  Service: Cardiopulmonary;  Laterality: N/A;  . FLEXIBLE BRONCHOSCOPY Right 07/11/2018   Procedure: FLEXIBLE BRONCHOSCOPY;  Surgeon: [name], MD;  Location: [facility];  Service: Thoracic;  Laterality: Right;  . KYPHOPLASTY N/A 09/28/2016   Procedure: KYPHOPLASTY;  Surgeon: Hessie Knows, MD;  Location: [facility];  Service: Orthopedics;  Laterality: N/A;  . KYPHOPLASTY N/A 02/20/2018   Procedure: [id number];  Surgeon: Hessie Knows, MD;  Location: [facility];  Service: Orthopedics;  Laterality: N/A;  . PACEMAKER INSERTION  03/2012  . thorocentesis  12/24/2016  . VAGINAL HYSTERECTOMY     partial - left ovary remains  . VIDEO ASSISTED THORACOSCOPY Right 07/11/2018   Procedure: VIDEO ASSISTED THORACOSCOPY;  Surgeon: [name], MD;  Location: [facility];  Service: Thoracic;  Laterality: Right;  Marland Kitchen VIDEO ASSISTED THORACOSCOPY (VATS) W/TALC PLEUADESIS Right 07/11/2018   Procedure: THORACOTOMY PLEURAL BIOPSY WITH TALC PLEURODESIS;  Surgeon: [name], MD;  Location:  [facility];  Service: Thoracic;  Laterality: Right;  pleural biopsy    Prior to Admission medications   Medication Sig Start Date End Date Taking? Authorizing Provider  acetaminophen (TYLENOL) 500 MG tablet Take 1,000 mg by mouth every 6 (six) hours as needed for mild pain or moderate pain.     [provider]  amiodarone (PACERONE) 200 MG tablet Take 1 tablet (200 mg total) by mouth daily. 04/27/18   Minna Merritts, MD  budesonide (PULMICORT) 0.5 MG/2ML nebulizer solution Take 2 mLs (0.5 mg total) by nebulization 2 (two) times daily. 05/12/18 05/12/19  Flora Lipps, MD  Calcium Carb-Cholecalciferol (CALCIUM 600-D PO) Take 1 tablet by mouth every morning.     [provider]  cetirizine (ZYRTEC) 10 MG tablet TAKE (1) TABLET BY MOUTH EVERY DAY Patient taking differently: Take 10 mg by mouth daily.  06/15/18   Glean Hess, MD  ELIQUIS 5 MG TABS tablet TAKE (1) TABLET BY MOUTH TWICE DAILY Patient taking  differently: Take 5 mg by mouth 2 (two) times daily.  02/13/18   Gollan, Kathlene November, MD  ENTRESTO 24-26 MG Take 1 tablet by mouth 2 (two) times daily.  07/22/18   [provider]  levothyroxine (SYNTHROID, LEVOTHROID) 88 MCG tablet Take 1 tablet (88 mcg total) by mouth daily before breakfast. 12/20/17   Glean Hess, MD  metoprolol succinate (TOPROL-XL) 25 MG 24 hr tablet TAKE ONE TABLET BY MOUTH EVERY MORNING AND TAKE TWO TABLETS BY MOUTH EVERY EVENING Patient taking differently: Take 25-50 mg by mouth See admin instructions. Take 25 mg by mouth in the morning and 50 mg in the evening 07/24/18   Minna Merritts, MD  Multiple Vitamins-Minerals (CENTRUM SILVER PO) Take 1 tablet by mouth daily.    [provider]  oxyCODONE (OXY IR/ROXICODONE) 5 MG immediate release tablet Take 1 tablet (5 mg total) by mouth every 4 (four) hours as needed for severe pain or breakthrough pain. Patient not taking: Reported on 08/21/2018 07/13/18   Tylene Fantasia, PA-C  OXYGEN Inhale 2 L into the lungs continuous.     [provider]  Revefenacin (YUPELRI) 175 MCG/3ML SOLN Inhale 3 mLs into the lungs daily. DX: J76.77 05/12/18   Flora Lipps, MD  rosuvastatin (CRESTOR) 5 MG tablet Take 1 tablet (5 mg total) by mouth daily. 10/19/17   Minna Merritts, MD  senna (SENOKOT) 8.6 MG TABS tablet Take 1 tablet (8.6 mg total) by mouth daily. Patient taking differently: Take 1 tablet by mouth 2 (two) times daily.  05/27/16   Rudene Re, MD    Allergies Lovenox [enoxaparin sodium]  Family History  Problem Relation Age of Onset  . COPD Mother        sister, and brother  . Lung disease Father   . Stroke Maternal Grandmother   . Hypertension Sister   . COPD Sister   . Hypertension Brother   . COPD Brother   . Colon cancer Brother   . Heart attack Neg Hx   . Breast cancer Neg Hx     Social History Social History   Tobacco Use  . Smoking status: Former Smoker    Packs/day:  1.00    Years: 40.00    Pack years: 40.00    Types: Cigarettes    Last attempt to quit: 08/30/2000    Years since quitting: 17.9  . Smokeless tobacco: Never Used  . Tobacco comment: quit smoking in 08/28/2000. Smoking cessation materials not required  Substance Use Topics  . Alcohol use: Not Currently    Alcohol/week: 10.0 standard drinks    Types: 10 Glasses of wine per week    Comment: 2 glasses of wine per day  . Drug use: No    Review of Systems  Constitutional: No fever/chills Eyes: No visual changes. ENT: No sore throat. Cardiovascular: as above Respiratory: Denies shortness of breath. Gastrointestinal: No abdominal pain.  No nausea, no vomiting.  No diarrhea.  No constipation. Genitourinary: Negative for dysuria. Musculoskeletal: Negative for back pain. Skin: Negative for rash. Neurological: Negative for headaches, focal weakness or numbness.   ____________________________________________   PHYSICAL EXAM:  VITAL SIGNS: ED Triage Vitals  Enc Vitals Group     BP 08/21/18 1025 (!) 150/66     Pulse Rate 08/21/18 1025 (!) 59     Resp 08/21/18 1025 (!) 21     Temp 08/21/18 1025 (!) 97.3 F (36.3 C)     Temp src --      SpO2 08/21/18 1020 (!) 82 %     Weight 08/21/18 1026 153 lb 15.9 oz (69.8 kg)     Height 08/21/18 1026 _0  (1.676 m)     Head Circumference --      Peak Flow --      Pain Score 08/21/18 1026 0     Pain Loc --      Pain Edu? --      Excl. in [place]? --     Constitutional: Alert and oriented. Well appearing and in no acute distress. Eyes: Conjunctivae are normal.  Head: Atraumatic. Nose: No congestion/rhinnorhea. Mouth/Throat: Mucous membranes are moist.  Neck: No stridor.   Cardiovascular: Normal rate, regular rhythm. Grossly normal heart sounds.  Good peripheral circulation. Respiratory: Normal respiratory effort.  Wheezing throughout all fields with prolonged expiratory phase.   Gastrointestinal: Soft and nontender. No distention.    Musculoskeletal: No lower extremity tenderness nor edema.  No joint effusions. Neurologic:  Normal speech and language. No gross focal neurologic deficits are appreciated. Skin:  Skin is warm, dry and intact. No rash noted. Psychiatric: Mood and affect are normal. Speech and behavior are normal.  ____________________________________________   LABS (all labs ordered are listed, but only abnormal results are displayed)  Labs Reviewed  CBC WITH DIFFERENTIAL/PLATELET - Abnormal; Notable for the following components:      Result Value   RBC 3.57 (*)    Hemoglobin 10.3 (*)    HCT 34.1 (*)    RDW 16.1 (*)    Lymphs Abs 0.5 (*)    All other components within normal limits  BASIC METABOLIC PANEL - Abnormal; Notable for the following components:   Calcium 8.6 (*)    All other components within normal limits  BRAIN NATRIURETIC PEPTIDE - Abnormal; Notable for the following components:   B Natriuretic Peptide 778.0 (*)    All other components within normal limits  TROPONIN I   ____________________________________________  EKG  ED ECG REPORT I, Doran Stabler, the attending physician, personally viewed and interpreted this ECG.   Date: 08/21/2018  EKG Time: 1032  Rate: 60  Rhythm: Atrial paced rhythm  Axis: Normal  Intervals:Incomplete right bundle branch block  ST&T Change: No ST segment elevation or depression.  No abnormal T wave inversion.  ____________________________________________  RADIOLOGY  Interstitial fibrosis bilaterally.  Stable in the left lung base and increased overall in the right.  Question degree of interstitial pulmonary edema versus lymphangitic spread of tumor on the right. ____________________________________________  PROCEDURES  Procedure(s) performed:   Procedures  Critical Care performed:   ____________________________________________   INITIAL IMPRESSION / ASSESSMENT AND PLAN / ED COURSE  Pertinent labs & imaging results that were  available during my care of the patient were reviewed by me and considered in my medical decision making (see chart for details).  Differential includes, but is not limited to, viral syndrome, bronchitis including COPD exacerbation, pneumonia, reactive airway disease including asthma, CHF including exacerbation with or without pulmonary/interstitial edema, pneumothorax, ACS, thoracic trauma, and pulmonary embolism. As part of my medical decision making, I reviewed the following data within the electronic MEDICAL RECORD NUMBER Notes from prior visits to Dr. Lenard Forth office.  Reviewed the x-ray with the patient and family and they state that Dr. Faith Rogue has ruled out spread of the tumor and they do not believe that she has having recurrence of her lung cancer.  ----------------------------------------- 3:02 PM on 08/21/2018 -----------------------------------------  Patient feeling improved.  I re-auscultated her lungs and she is moving more air and only with minimal wheezing at this time.  However, when the patient was walk she desaturated to 85% on 2 L of nasal cannula oxygen.  She was increased to 4 L of nasal cannula oxygen and only went up to 88%.  Now, after walking she says that she feels a tightness in her chest and is coughing more.  Wheezing increased throughout.  Will be admitted to the hospital.  Signed out to Dr. Lelon Mast.  Patient and family understanding of diagnosis well treatment plan. ____________________________________________   FINAL CLINICAL IMPRESSION(S) / ED DIAGNOSES  CHF.  COPD.  NEW MEDICATIONS STARTED DURING THIS VISIT:  New Prescriptions   No medications on file     Note:  This document was prepared using Dragon voice recognition software and may include unintentional dictation errors.     Orbie Pyo, MD 08/21/18 [phone]

## 2018-08-21 NOTE — Progress Notes (Signed)
Patient admitted to unit. Oriented to room, call bell, and staff. Bed in lowest position. Fall safety plan reviewed. Full assessment to Epic. Skin assessment verified with Tiffany RN. Telemetry box verification with tele clerk- Box#: [id number]. Patient declines bed alarm while family is present, but agrees to turn it on whenever they leave tonight. Will continue to monitor.

## 2018-08-22 ENCOUNTER — Observation Stay (HOSPITAL_BASED_OUTPATIENT_CLINIC_OR_DEPARTMENT_OTHER)
Admit: 2018-08-22 | Discharge: 2018-08-22 | Disposition: A | Discharge status: Home / Self Care | Payer: Medicare Other | Attending: Internal Medicine | Admitting: Internal Medicine

## 2018-08-22 DIAGNOSIS — J441 Chronic obstructive pulmonary disease with (acute) exacerbation: Secondary | ICD-10-CM | POA: Diagnosis not present

## 2018-08-22 DIAGNOSIS — I361 Nonrheumatic tricuspid (valve) insufficiency: Secondary | ICD-10-CM | POA: Diagnosis not present

## 2018-08-22 DIAGNOSIS — I370 Nonrheumatic pulmonary valve stenosis: Secondary | ICD-10-CM

## 2018-08-22 DIAGNOSIS — I340 Nonrheumatic mitral (valve) insufficiency: Secondary | ICD-10-CM | POA: Diagnosis not present

## 2018-08-22 LAB — CBC
HCT: 31.7 % — ABNORMAL LOW (ref 36.0–46.0)
Hemoglobin: 9.9 g/dL — ABNORMAL LOW (ref 12.0–15.0)
MCH: 29.5 pg (ref 26.0–34.0)
MCHC: 31.2 g/dL (ref 30.0–36.0)
MCV: 94.3 fL (ref 80.0–100.0)
Platelets: 219 10*3/uL (ref 150–400)
RBC: 3.36 MIL/uL — ABNORMAL LOW (ref 3.87–5.11)
RDW: 16 % — ABNORMAL HIGH (ref 11.5–15.5)
WBC: 3.2 10*3/uL — ABNORMAL LOW (ref 4.0–10.5)
nRBC: 0 % (ref 0.0–0.2)

## 2018-08-22 LAB — BASIC METABOLIC PANEL
Anion gap: 9 (ref 5–15)
BUN: 17 mg/dL (ref 8–23)
CO2: 26 mmol/L (ref 22–32)
Calcium: 8.5 mg/dL — ABNORMAL LOW (ref 8.9–10.3)
Chloride: 102 mmol/L (ref 98–111)
Creatinine, Ser: 1.05 mg/dL — ABNORMAL HIGH (ref 0.44–1.00)
GFR calc non Af Amer: 50 mL/min — ABNORMAL LOW (ref >60)
GFR, EST AFRICAN AMERICAN: 58 mL/min — AB (ref >60)
Glucose, Bld: 159 mg/dL — ABNORMAL HIGH (ref 70–99)
Potassium: 3.9 mmol/L (ref 3.5–5.1)
Sodium: 137 mmol/L (ref 135–145)

## 2018-08-22 MED ORDER — FUROSEMIDE 20 MG PO TABS: 20.0000 mg | ORAL_TABLET | Freq: Every day | ORAL | 11 refills | Status: DC

## 2018-08-22 MED ORDER — METOPROLOL SUCCINATE ER 25 MG PO TB24: 25.0000 mg | ORAL_TABLET | ORAL | Status: DC

## 2018-08-22 MED ORDER — FUROSEMIDE 10 MG/ML IJ SOLN
20.0000 mg | Freq: Once | INTRAMUSCULAR | Status: AC
  Administered 2018-08-22: 20 mg via INTRAVENOUS
  Filled 2018-08-22: qty 2

## 2018-08-22 NOTE — Discharge Summary (Signed)
[facility] [place] NAME: Jenny Cooper    MR#:  [id number]  DATE OF BIRTH:  December 18, 1935  DATE OF ADMISSION:  08/21/2018 ADMITTING PHYSICIAN: Sela Hua, MD  DATE OF DISCHARGE: 08/22/2018  PRIMARY CARE PHYSICIAN: Glean Hess, MD    ADMISSION DIAGNOSIS:  COPD exacerbation ([place]) [J44.1] Acute on chronic congestive heart failure, unspecified heart failure type ([place]) [I50.9]  DISCHARGE DIAGNOSIS:  Active Problems:   Acute on chronic respiratory failure with hypoxia ([place])   SECONDARY DIAGNOSIS:   Past Medical History:  Diagnosis Date  . Acute respiratory failure with hypoxia ([place]) 12/11/2017  . Atypical atrial flutter ([place])    a. s/p ablation 07/27/2013 followed by Dr. Rockey Situ  . Balance problem   . CAD (coronary artery disease)    a. s/p MI x 2 in 2002 s/p PCI x 2 in 2002; b. s/p 2v CABG 2002; c. stress echo 07/2004 w/ evi of pos & inf infarct & no evi of ischemia; d. 4/08 dipyridamole scan w/ multiple areas of infarct, no ischemia, EF 49%; e. cath 04/28/15 3v CAD, med Rx rec, no targets for revasc, LM lum irregs, pLAD 30%, 100%, ost-pLCx 60%, [name] 99%, OM2 100%, p-mRCA 90%, [name] 100% L-R collats, VG-mLAD irregs, VG-OM2 oc  . Carcinoma of right lung ([place]) 01/03/2015   a. followed by Dr. Oliva Bustard  . Chronic systolic CHF (congestive heart failure) ([place])    a. echo 03/2015: EF 30-35%, sev ant/inf/pos HK, in mild to mod MR  . Complication of anesthesia    more recently patient oxygen levels do not rebound as quickly  . Compressed spine fracture ([place]) 08/06/2016   lumbar 2, t11, t12  . COPD (chronic obstructive pulmonary disease) ([place])   . Fractured pelvis ([place]) 05/26/2016   2 places  . GERD (gastroesophageal reflux disease)   . History of blood clots    12/2001 left leg  . History of colonoscopy 2013  . History of mammography, screening 2015  . History of Papanicolaou smear of cervix 2013  . HLD (hyperlipidemia)   . HTN  (hypertension)   . Hypothyroidism   . Lung cancer ([place])   . Mitral regurgitation    a. s/p mitral ring placement 09/2000; b. echo 09/2010: EF 50%, inf HK, post AK, mild MR, prosthetic mitral valve ring w/ peak gradient of 10 mmHg; b. echo 2/13: EF 50%, mild MR/TR     . Myocardial infarction ([place])    X 2 (LAST ONE IN 2002)  . Neuropathy   . Neuropathy   . Pacemaker    a. MDT 2002; b. generator replacement 2013; c. followed by Dr. Omelia Blackwater, MD  . PAF (paroxysmal atrial fibrillation) ([place])    a. on Eliquis   . Personal history of chemotherapy   . Personal history of radiation therapy   . Pneumonia     HOSPITAL COURSE:    82 year-old female with chronic atrial fibrillation, chronic hypoxic respiratory failure on 2 L oxygen due to COPD who presents with shortness of breath.  1.  Acute on chronic hypoxic respiratory failure in the setting of CHF exacerbation: Patient is on baseline oxygen and will require 2 L continuously  2.  Acute on chronic systolic heart failure ejection fraction 40%: Patient was diuresed with IV Lasix.  She will follow-up with CHF clinic upon discharge.  She will follow-up with Dr. Rockey Situ at the end of the week.  She will be discharged on low-dose Lasix. She will  continue Entresto and metoprolol  3.  COPD: I do not feel patient had exacerbation therefore does not need steroids at the time of discharge.  Her wheezing was  due to pulmonary edema.  4.  CAD: Patient will continue outpatient regimen  5.  Essential hypertension: Patient will continue metoprolol  6.  Hypothyroidism: Continue Synthroid  7.  Chronic atrial fibrillation: Heart rate is controlled.  She will continue metoprolol and Eliquis  DISCHARGE CONDITIONS AND DIET:   Stable for discharge on heart healthy diet  CONSULTS OBTAINED:    DRUG ALLERGIES:   Allergies  Allergen Reactions  . Lovenox [Enoxaparin Sodium] Itching    DISCHARGE MEDICATIONS:   Allergies as of 08/22/2018      Reactions    Lovenox [enoxaparin Sodium] Itching      Medication List    STOP taking these medications   oxyCODONE 5 MG immediate release tablet Commonly known as:  Oxy IR/ROXICODONE     TAKE these medications   acetaminophen 500 MG tablet Commonly known as:  TYLENOL Take 1,000 mg by mouth every 6 (six) hours as needed for mild pain or moderate pain.   amiodarone 200 MG tablet Commonly known as:  PACERONE Take 1 tablet (200 mg total) by mouth daily.   budesonide 0.5 MG/2ML nebulizer solution Commonly known as:  PULMICORT Take 2 mLs (0.5 mg total) by nebulization 2 (two) times daily.   CALCIUM 600-D PO Take 1 tablet by mouth daily.   CENTRUM SILVER PO Take 1 tablet by mouth daily.   cetirizine 10 MG tablet Commonly known as:  ZYRTEC TAKE (1) TABLET BY MOUTH EVERY DAY What changed:  See the new instructions.   ELIQUIS 5 MG Tabs tablet Generic drug:  apixaban TAKE (1) TABLET BY MOUTH TWICE DAILY What changed:  See the new instructions.   furosemide 20 MG tablet Commonly known as:  LASIX Take 1 tablet (20 mg total) by mouth daily.   levothyroxine 88 MCG tablet Commonly known as:  SYNTHROID, LEVOTHROID Take 1 tablet (88 mcg total) by mouth daily before breakfast.   metoprolol succinate 25 MG 24 hr tablet Commonly known as:  TOPROL-XL Take 1-2 tablets (25-50 mg total) by mouth See admin instructions. Take 25 mg by mouth in the morning and 50 mg in the evening What changed:  See the new instructions.   Revefenacin 175 MCG/3ML Soln Commonly known as:  YUPELRI Inhale 3 mLs into the lungs daily. DX: J76.77   rosuvastatin 5 MG tablet Commonly known as:  CRESTOR Take 1 tablet (5 mg total) by mouth daily. What changed:  when to take this   sacubitril-valsartan 24-26 MG Commonly known as:  ENTRESTO Take 1 tablet by mouth 2 (two) times daily.   senna 8.6 MG Tabs tablet Commonly known as:  SENOKOT Take 1 tablet (8.6 mg total) by mouth daily. What changed:  when to take  this            Durable Medical Equipment  (From admission, onward)         Start     Ordered   08/22/18 1045  DME Oxygen  Once    Question Answer Comment  Mode or (Route) Nasal cannula   Frequency Continuous (stationary and portable oxygen unit needed)   Oxygen conserving device Yes   Oxygen delivery system Gas      08/22/18 1048            Today   CHIEF COMPLAINT:  Patient is doing well this  morning.  Her oxygen level did decrease when she was not using the oxygen. She reports her shortness of breath has improved dramatically since admission.  She denies lower extremity edema.   VITAL SIGNS:  Blood pressure (!) 115/54, pulse 61, temperature 97.8 F (36.6 C), temperature source Oral, resp. rate 16, height 5\' 6"  (1.676 m), weight 66.5 kg, SpO2 100 %.   REVIEW OF SYSTEMS:  Review of Systems  Constitutional: Negative.  Negative for chills, fever and malaise/fatigue.  HENT: Negative.  Negative for ear discharge, ear pain, hearing loss, nosebleeds and sore throat.   Eyes: Negative.  Negative for blurred vision and pain.  Respiratory: Negative.  Negative for cough, hemoptysis, shortness of breath and wheezing.   Cardiovascular: Negative.  Negative for chest pain, palpitations and leg swelling.  Gastrointestinal: Negative.  Negative for abdominal pain, blood in stool, diarrhea, nausea and vomiting.  Genitourinary: Negative.  Negative for dysuria.  Musculoskeletal: Negative.  Negative for back pain.  Skin: Negative.   Neurological: Negative for dizziness, tremors, speech change, focal weakness, seizures and headaches.  Endo/Heme/Allergies: Negative.  Does not bruise/bleed easily.  Psychiatric/Behavioral: Negative.  Negative for depression, hallucinations and suicidal ideas.     PHYSICAL EXAMINATION:  GENERAL:  82 y.o.-year-old patient lying in the bed with no acute distress.  NECK:  Supple, no jugular venous distention. No thyroid enlargement, no tenderness.   LUNGS: Normal breath sounds bilaterally, no wheezing, rales,rhonchi  No use of accessory muscles of respiration.  CARDIOVASCULAR: irr,irr No murmurs, rubs, or gallops.  ABDOMEN: Soft, non-tender, non-distended. Bowel sounds present. No organomegaly or mass.  EXTREMITIES: No pedal edema, cyanosis, or clubbing.  PSYCHIATRIC: The patient is alert and oriented x 3.  SKIN: No obvious rash, lesion, or ulcer.   DATA REVIEW:   CBC Recent Labs  Lab 08/22/18 0413  WBC 3.2*  HGB 9.9*  HCT 31.7*  PLT 219    Chemistries  Recent Labs  Lab 08/22/18 0413  NA 137  K 3.9  CL 102  CO2 26  GLUCOSE 159*  BUN 17  CREATININE 1.05*  CALCIUM 8.5*    Cardiac Enzymes Recent Labs  Lab 08/21/18 1224  [place] <0.03    Microbiology Results  @MICRORSLT48 @  RADIOLOGY:  Dg Chest 2 View  Result Date: 08/21/2018 CLINICAL DATA:  Cough and shortness of breath. History of lung carcinoma and pleural effusion EXAM: CHEST - 2 VIEW COMPARISON:  August 07, 2018 FINDINGS: There is diffuse fibrotic change throughout the lungs with an increase compared to recent study on the right. There is a small right pleural effusion. There is atelectatic change and scarring in the right mid lung. Heart is upper normal in size with pulmonary vascularity normal. Patient is status post mitral valve replacement and coronary artery bypass grafting. Pacemaker leads are attached to the right atrium and middle cardiac vein. No pneumothorax. Patient has had kyphoplasty procedures in the lower thoracic and upper lumbar regions. There is aortic atherosclerosis. Port-A-Cath tip is in the superior vena cava. IMPRESSION: Interstitial fibrosis bilaterally, stable in the left base and increased overall on the right. Question a degree of interstitial pulmonary edema versus lymphangitic spread of tumor on the right. There is atelectatic change in scarring in the right mid lung with volume loss right apex. No frank consolidation. Stable  cardiac silhouette. No adenopathy appreciable by radiography. There is aortic atherosclerosis. Stable postoperative changes. Port-A-Cath tip in superior vena cava. Aortic Atherosclerosis (ICD10-I70.0). Electronically Signed   By: Lowella Grip III M.D.  On: 08/21/2018 09:04      Allergies as of 08/22/2018      Reactions   Lovenox [enoxaparin Sodium] Itching      Medication List    STOP taking these medications   oxyCODONE 5 MG immediate release tablet Commonly known as:  Oxy IR/ROXICODONE     TAKE these medications   acetaminophen 500 MG tablet Commonly known as:  TYLENOL Take 1,000 mg by mouth every 6 (six) hours as needed for mild pain or moderate pain.   amiodarone 200 MG tablet Commonly known as:  PACERONE Take 1 tablet (200 mg total) by mouth daily.   budesonide 0.5 MG/2ML nebulizer solution Commonly known as:  PULMICORT Take 2 mLs (0.5 mg total) by nebulization 2 (two) times daily.   CALCIUM 600-D PO Take 1 tablet by mouth daily.   CENTRUM SILVER PO Take 1 tablet by mouth daily.   cetirizine 10 MG tablet Commonly known as:  ZYRTEC TAKE (1) TABLET BY MOUTH EVERY DAY What changed:  See the new instructions.   ELIQUIS 5 MG Tabs tablet Generic drug:  apixaban TAKE (1) TABLET BY MOUTH TWICE DAILY What changed:  See the new instructions.   furosemide 20 MG tablet Commonly known as:  LASIX Take 1 tablet (20 mg total) by mouth daily.   levothyroxine 88 MCG tablet Commonly known as:  SYNTHROID, LEVOTHROID Take 1 tablet (88 mcg total) by mouth daily before breakfast.   metoprolol succinate 25 MG 24 hr tablet Commonly known as:  TOPROL-XL Take 1-2 tablets (25-50 mg total) by mouth See admin instructions. Take 25 mg by mouth in the morning and 50 mg in the evening What changed:  See the new instructions.   Revefenacin 175 MCG/3ML Soln Commonly known as:  YUPELRI Inhale 3 mLs into the lungs daily. DX: J76.77   rosuvastatin 5 MG tablet Commonly known as:   CRESTOR Take 1 tablet (5 mg total) by mouth daily. What changed:  when to take this   sacubitril-valsartan 24-26 MG Commonly known as:  ENTRESTO Take 1 tablet by mouth 2 (two) times daily.   senna 8.6 MG Tabs tablet Commonly known as:  SENOKOT Take 1 tablet (8.6 mg total) by mouth daily. What changed:  when to take this            Durable Medical Equipment  (From admission, onward)         Start     Ordered   08/22/18 1045  DME Oxygen  Once    Question Answer Comment  Mode or (Route) Nasal cannula   Frequency Continuous (stationary and portable oxygen unit needed)   Oxygen conserving device Yes   Oxygen delivery system Gas      08/22/18 1048           Management plans discussed with the patient and she is in agreement. Stable for discharge home  Patient should follow up with pcp  CODE STATUS:     Code Status Orders  (From admission, onward)         Start     Ordered   08/21/18 1824  Full code  Continuous     08/21/18 1823        Code Status History    Date Active Date Inactive Code Status Order ID Comments User Context   07/11/2018 1140 07/13/2018 2336 Full Code [id number]  [name], MD Inpatient   02/20/2018 1314 02/20/2018 1643 Full Code [id number]  Hessie Knows, MD Inpatient   12/11/2017 [phone]  12/14/2017 1546 Full Code [id number]  Loletha Grayer, MD ED   04/06/2017 1714 04/08/2017 1958 Full Code [id number]  Demetrios Loll, MD Inpatient   10/06/2015 1627 10/07/2015 1747 Full Code [id number]  Vaughan Basta, MD Inpatient   04/28/2015 1335 04/28/2015 1858 Full Code [id number]  Wellington Hampshire, MD Inpatient   04/01/2015 1419 04/07/2015 1736 Full Code [id number]  Max Sane, MD Inpatient    Advance Directive Documentation     Most Recent Value  Type of Advance Directive  Healthcare Power of Attorney  Pre-existing out of facility DNR order (yellow form or pink MOST form)  -  "MOST" Form in Place?  -      TOTAL TIME TAKING CARE OF THIS PATIENT: 38 minutes.     Note: This dictation was prepared with Dragon dictation along with smaller phrase technology. Any transcriptional errors that result from this process are unintentional.  Navia Lindahl M.D on 08/22/2018 at 10:48 AM  Between 7am to 6pm - Pager - [phone] After 6pm go to [email or web address] - password [email or web address] [facility]  Office  [phone]  CC: Primary care physician; Glean Hess, MD

## 2018-08-22 NOTE — Plan of Care (Signed)
  Problem: Activity: Goal: Ability to tolerate increased activity will improve Outcome: Progressing   Problem: Activity: Goal: Capacity to carry out activities will improve Outcome: Progressing   Problem: Cardiac: Goal: Ability to achieve and maintain adequate cardiopulmonary perfusion will improve Outcome: Progressing

## 2018-08-22 NOTE — Progress Notes (Signed)
*  PRELIMINARY RESULTS* Echocardiogram 2D Echocardiogram has been performed.  Jenny Cooper 08/22/2018, 10:23 AM

## 2018-08-22 NOTE — Progress Notes (Signed)
Patient discharged to home at this time.  Tele and IV d/c'd prior to discharge. Patient verbalizes understanding of discharge instructions.

## 2018-08-22 NOTE — Care Management Note (Signed)
Case Management Note  Patient Details  Name: Jenny Cooper MRN: [id number] Date of Birth: 1935-11-30  Subjective/Objective:     Patient is from home; son lives with her.  Placed in obs for acute on chronic respiatory failure.  History of COPD, CHF.  Chronic oxygen at 2L.  Has portable O2 at bedside for discharge.  Denies difficulties obtaining medications or with medical care. Plenty of family support. Current with her PCP.  Has a functioning scale at home and weighs daily. Her daughter will transport to home today.  Refuses home health services.  Recently closed with [facility].  No further needs identified at this time by [facility].               Action/Plan:   Expected Discharge Date:  08/22/18               Expected Discharge Plan:  Home/Self Care  In-House Referral:     Discharge planning Services  CM Consult  Post Acute Care Choice:    Choice offered to:     DME Arranged:    DME Agency:     HH Arranged:  Patient Refused [place] Agency:     Status of Service:  Completed, signed off  If discussed at [employer] of Stay Meetings, dates discussed:    Additional Comments:  Elza Rafter, RN 08/22/2018, 11:52 AM

## 2018-08-23 LAB — ECHOCARDIOGRAM COMPLETE
Height: 66 in
Weight: 2347.2 oz

## 2018-08-27 ENCOUNTER — Telehealth: Payer: Self-pay

## 2018-08-27 NOTE — Telephone Encounter (Signed)
Contacted patient in regards to her recent admission. Spoke with Remo Lipps and left message to contact office as Ms. Balash was unavailable to come to the phone. He advised she would contact the office tomorrow.

## 2018-08-27 NOTE — Telephone Encounter (Signed)
-----   Message from Alisa Graff, [place] sent at 08/24/2018 12:00 PM EST ----- Regarding: please call to discuss [facility] Hospitalist referral D/C'd 12/24

## 2018-08-31 NOTE — Telephone Encounter (Signed)
Patient states that she is seeing Jenny Cooper on the 9th so she does not feel it is necessary to be seen at the clinic as well. I advised that she may contact the office should she need to be seen.

## 2018-09-02 ENCOUNTER — Encounter: Payer: Self-pay | Admitting: Physician Assistant

## 2018-09-02 NOTE — Progress Notes (Signed)
Cardiology Office Note Date:  09/04/2018  Patient ID:  Jenny Cooper, Jenny Cooper 03/28/1936, MRN [id number] PCP:  Glean Hess, MD  Cardiologist:  Dr. Rockey Situ, MD    Chief Complaint: Hospital follow up  History of Present Illness: [name] is a 83 y.o. female with history of CAD status post two-vessel CABG as detailed below in 2002, mitral valve repair at the same time as her CABG in 2002, HFrEF secondary to ICM with subsequent normalization of EF by echo in 62/2297, chronic diastolic CHF, pulmonary hypertension, PAF status post TEE/DCCV in 2013 on Eliquis, atypical atrial flutter status post ablation in 06/2013 with recurrent flutter s/p DCCV 12/2016 and again in 03/2017, sinus node dysfunction status post MDT PPM in 2002 with generator replacement in 2013, chronic respiratory failure with hypoxia on home oxygen, right lung carcinoma status post chemoradiation in 2016, recurrent right-sided pleural effusion requiring recurrent ultrasound-guided thoracentesis without evidence of malignancy s/p recent right-sided thorascopy and Plurex catheter in 06/2018 s/p removal of catheter in 07/2018, prior tobacco abuse quitting in 2001, radiation pneumonitis, COPD, anemia, hypertension, hyperlipidemia, hypothyroidism, neuropathy, prior pelvic fracture, and chronic back pain from collapsed vertebrae who presents for hospital follow up after recent admission to [facility] from 12/23-12/24 for acute on chronic respiratory failure secondary to AECOPD and acute on chronic combined CHF.   She has known CAD as above with history of MI x 2 in January of 2002. She underwent PCI x 2 at this time. Per her report she did not tolerate this well which led to her 2 vessel CABG at that time. She underwent stress echo in 2005 that showed evidence of posterior and inferior infarct and no evidence of ischemia, and again in 11/2006 via dipyridamole stress test that showed multiple areas of infarct without ischemia. EF 49%. She has  undergone multiple echoes over the years that have shown stable EF. She was admitted in 03/2015 with PNA and elevated troponin. Echo showed a newly reduced EF of 30-35%, severe anterior and inferior/posterior wall HK, mild to moderate MR, moderate TR, PASP 60 mmHg. In this setting, she underwent outpatient LHC in that showed severe 3-vessel CAD with patent SVG to LAD, occluded SVG to OM, native RCA had severe ISR in the mid-segment and was occluded distally at the sites of previously placed stents with left-to-right collaterals, moderately reduced LVSF with an EF of 35-40%, mildly elevated LVEDP. There were no good targets for revascularization and medical therapy was advised.    She was admitted to [facility] on 08/21/2018 with increased SOB and hypoxia and felt to have AECOPD and acute on chronic combined CHF. Labs showed a BNP of 778, CXR with bilateral interstitial fibrosis with possible pulmonary edema. Troponin negative x 1. Echo showed an EF of 55-60%, Gr2DD, mild AS, mild to moderate MR, mildly dilated left atrium, PASP 63 mmHg, trivial pericardial effusion. She was treated for AECOPD and diuresed by IM. WIth diuresis, she was noted to have an increase in her renal function with BUN/SCr trending from 12/0.87-->17/1.05. HGB low, though relatively stable with a trend of 10.3-->9.9. Discharge WBC 3.2. Documented discharge weight of 66.5 kg.   Discharge medications:   Medication List        STOP taking these medications       oxyCODONE 5 MG immediate release tablet Commonly known as:  Oxy IR/ROXICODONE             TAKE these medications       acetaminophen 500 MG  tablet Commonly known as:  TYLENOL Take 1,000 mg by mouth every 6 (six) hours as needed for mild pain or moderate pain.   amiodarone 200 MG tablet Commonly known as:  PACERONE Take 1 tablet (200 mg total) by mouth daily.   budesonide 0.5 MG/2ML nebulizer solution Commonly known as:  PULMICORT Take 2 mLs (0.5 mg total) by  nebulization 2 (two) times daily.   CALCIUM 600-D PO Take 1 tablet by mouth daily.   CENTRUM SILVER PO Take 1 tablet by mouth daily.   cetirizine 10 MG tablet Commonly known as:  ZYRTEC TAKE (1) TABLET BY MOUTH EVERY DAY What changed:  See the new instructions.   ELIQUIS 5 MG Tabs tablet Generic drug:  apixaban TAKE (1) TABLET BY MOUTH TWICE DAILY What changed:  See the new instructions.   furosemide 20 MG tablet Commonly known as:  LASIX Take 1 tablet (20 mg total) by mouth daily.   levothyroxine 88 MCG tablet Commonly known as:  SYNTHROID, LEVOTHROID Take 1 tablet (88 mcg total) by mouth daily before breakfast.   metoprolol succinate 25 MG 24 hr tablet Commonly known as:  TOPROL-XL Take 1-2 tablets (25-50 mg total) by mouth See admin instructions. Take 25 mg by mouth in the morning and 50 mg in the evening What changed:  See the new instructions.   Revefenacin 175 MCG/3ML Soln Commonly known as:  YUPELRI Inhale 3 mLs into the lungs daily. DX: J76.77   rosuvastatin 5 MG tablet Commonly known as:  CRESTOR Take 1 tablet (5 mg total) by mouth daily. What changed:  when to take this   sacubitril-valsartan 24-26 MG Commonly known as:  ENTRESTO Take 1 tablet by mouth 2 (two) times daily.   senna 8.6 MG Tabs tablet Commonly known as:  SENOKOT Take 1 tablet (8.6 mg total) by mouth daily. What changed:  when to take this    She comes in doing very well from a cardiac perspective.  She indicates her shortness of breath is much improved and back to baseline.  She remains on her baseline supplemental oxygen at 2 L via nasal cannula.  No chest pain, dizziness, presyncope, or syncope.  No lower extremity swelling, abdominal distention, orthopnea, PND, or early satiety.  Patient indicates that she "has not felt this well in a long time."  She indicates her energy level continues to improve.  No falls since she was last seen.  No BRBPR or melena.  Past Medical History:   Diagnosis Date  . Acute respiratory failure with hypoxia ([place]) 12/11/2017  . Atypical atrial flutter ([name])    a. s/p ablation 07/27/2013 followed by Dr. Rockey Situ  . Balance problem   . CAD (coronary artery disease)    a. s/p MI x 2 in 2002 s/p PCI x 2 in 2002; b. s/p 2v CABG 2002; c. stress echo 07/2004 w/ evi of pos & inf infarct & no evi of ischemia; d. 4/08 dipyridamole scan w/ multiple areas of infarct, no ischemia, EF 49%; e. cath 04/28/15 3v CAD, med Rx rec, no targets for revasc, LM lum irregs, pLAD 30%, 100%, ost-pLCx 60%, [name] 99%, OM2 100%, p-mRCA 90%, [name] 100% L-R collats, VG-mLAD irregs, VG-OM2 oc  . Carcinoma of right lung ([place]) 01/03/2015   a. followed by Dr. Oliva Bustard  . Chronic systolic CHF (congestive heart failure) ([place])    a. echo 03/2015: EF 30-35%, sev ant/inf/pos HK, in mild to mod MR  . Complication of anesthesia    more recently patient  oxygen levels do not rebound as quickly  . Compressed spine fracture ([place]) 08/06/2016   lumbar 2, t11, t12  . COPD (chronic obstructive pulmonary disease) ([place])   . Fractured pelvis ([place]) 05/26/2016   2 places  . GERD (gastroesophageal reflux disease)   . History of blood clots    12/2001 left leg  . History of colonoscopy 2013  . History of mammography, screening 2015  . History of Papanicolaou smear of cervix 2013  . HLD (hyperlipidemia)   . HTN (hypertension)   . Hypothyroidism   . Lung cancer ([place])   . Mitral regurgitation    a. s/p mitral ring placement 09/2000; b. echo 09/2010: EF 50%, inf HK, post AK, mild MR, prosthetic mitral valve ring w/ peak gradient of 10 mmHg; b. echo 2/13: EF 50%, mild MR/TR     . Myocardial infarction ([place])    X 2 (LAST ONE IN 2002)  . Neuropathy   . Pacemaker    a. MDT 2002; b. generator replacement 2013; c. followed by Dr. Omelia Blackwater, MD  . PAF (paroxysmal atrial fibrillation) ([place])    a. on Eliquis   . Personal history of chemotherapy   . Personal history of radiation therapy   . Pneumonia      Past Surgical History:  Procedure Laterality Date  . ABLATION  04/2016   [facility]  . APPENDECTOMY    . CARDIAC CATHETERIZATION N/A 04/28/2015   Procedure: Left Heart Cath and Coronary Angiography;  Surgeon: Wellington Hampshire, MD;  Location: [place] [facility];  Service: Cardiovascular;  Laterality: N/A;  . CARDIOVERSION N/A 04/08/2017   Procedure: CARDIOVERSION;  Surgeon: Wellington Hampshire, MD;  Location: [facility];  Service: Cardiovascular;  Laterality: N/A;  . CHEST TUBE INSERTION N/A 07/11/2018   Procedure: PLEURX CATH INSERTION;  Surgeon: [name], MD;  Location: [facility];  Service: Thoracic;  Laterality: N/A;  . COLONOSCOPY  12/2009   2 small tubular adenomas  . CORONARY ARTERY BYPASS GRAFT  09/2000  . DRAIN REMOVAL Right 08/10/2018   Procedure: DRAIN REMOVAL;  Surgeon: [name], MD;  Location: [facility];  Service: General;  Laterality: Right;  . ECTOPIC PREGNANCY SURGERY    . ELECTROMAGNETIC NAVIGATION BROCHOSCOPY N/A 10/06/2015   Procedure: ELECTROMAGNETIC NAVIGATION BRONCHOSCOPY;  Surgeon: Flora Lipps, MD;  Location: [facility];  Service: Cardiopulmonary;  Laterality: N/A;  . ENDOBRONCHIAL ULTRASOUND N/A 10/06/2015   Procedure: ENDOBRONCHIAL ULTRASOUND;  Surgeon: Flora Lipps, MD;  Location: [facility];  Service: Cardiopulmonary;  Laterality: N/A;  . FLEXIBLE BRONCHOSCOPY Right 07/11/2018   Procedure: FLEXIBLE BRONCHOSCOPY;  Surgeon: [name], MD;  Location: [facility];  Service: Thoracic;  Laterality: Right;  . KYPHOPLASTY N/A 09/28/2016   Procedure: KYPHOPLASTY;  Surgeon: Hessie Knows, MD;  Location: [facility];  Service: Orthopedics;  Laterality: N/A;  . KYPHOPLASTY N/A 02/20/2018   Procedure: [id number];  Surgeon: Hessie Knows, MD;  Location: [facility];  Service: Orthopedics;  Laterality: N/A;  . PACEMAKER INSERTION  03/2012  . thorocentesis  12/24/2016  . VAGINAL HYSTERECTOMY     partial - left ovary remains  . VIDEO ASSISTED THORACOSCOPY Right 07/11/2018   Procedure:  VIDEO ASSISTED THORACOSCOPY;  Surgeon: [name], MD;  Location: [facility];  Service: Thoracic;  Laterality: Right;  Marland Kitchen VIDEO ASSISTED THORACOSCOPY (VATS) W/TALC PLEUADESIS Right 07/11/2018   Procedure: THORACOTOMY PLEURAL BIOPSY WITH TALC PLEURODESIS;  Surgeon: [name], MD;  Location: [facility];  Service: Thoracic;  Laterality: Right;  pleural biopsy    Current Meds  Medication Sig  . acetaminophen (TYLENOL) 500 MG tablet Take 1,000 mg by mouth every 6 (six) hours as needed for mild pain or moderate pain.   Marland Kitchen amiodarone (PACERONE) 200 MG tablet Take 1 tablet (200 mg total) by mouth daily.  . budesonide (PULMICORT) 0.5 MG/2ML nebulizer solution Take 2 mLs (0.5 mg total) by nebulization 2 (two) times daily.  . Calcium Carb-Cholecalciferol (CALCIUM 600-D PO) Take 1 tablet by mouth daily.   . cetirizine (ZYRTEC) 10 MG tablet TAKE (1) TABLET BY MOUTH EVERY DAY (Patient taking differently: Take 10 mg by mouth daily. )  . ELIQUIS 5 MG TABS tablet TAKE (1) TABLET BY MOUTH TWICE DAILY (Patient taking differently: Take 5 mg by mouth 2 (two) times daily. )  . furosemide (LASIX) 20 MG tablet Take 1 tablet (20 mg total) by mouth daily.  Marland Kitchen levothyroxine (SYNTHROID, LEVOTHROID) 88 MCG tablet Take 1 tablet (88 mcg total) by mouth daily before breakfast.  . metoprolol succinate (TOPROL-XL) 25 MG 24 hr tablet Take 1-2 tablets (25-50 mg total) by mouth See admin instructions. Take 25 mg by mouth in the morning and 50 mg in the evening  . Multiple Vitamins-Minerals (CENTRUM SILVER PO) Take 1 tablet by mouth daily.  . Revefenacin (YUPELRI) 175 MCG/3ML SOLN Inhale 3 mLs into the lungs daily. DX: J76.77  . rosuvastatin (CRESTOR) 5 MG tablet Take 1 tablet (5 mg total) by mouth daily. (Patient taking differently: Take 5 mg by mouth at bedtime. )  . sacubitril-valsartan (ENTRESTO) 24-26 MG Take 1 tablet by mouth 2 (two) times daily.  Marland Kitchen senna (SENOKOT) 8.6 MG TABS tablet Take 1 tablet (8.6 mg total) by mouth daily.  (Patient taking differently: Take 1 tablet by mouth 2 (two) times daily. )    Allergies:   Lovenox [enoxaparin sodium]   Social History:  The patient  reports that she quit smoking about 18 years ago. Her smoking use included cigarettes. She has a 40.00 pack-year smoking history. She has never used smokeless tobacco. She reports previous alcohol use of about 10.0 standard drinks of alcohol per week. She reports that she does not use drugs.   Family History:  The patient's family history includes COPD in her brother, mother, and sister; Colon cancer in her brother; Hypertension in her brother and sister; Lung disease in her father; Stroke in her maternal grandmother.  ROS:   Review of Systems  Constitutional: Positive for malaise/fatigue. Negative for chills, diaphoresis, fever and weight loss.  HENT: Negative for congestion.   Eyes: Negative for discharge and redness.  Respiratory: Positive for shortness of breath. Negative for cough, hemoptysis, sputum production and wheezing.        Stable, much improved shortness of breath  Cardiovascular: Negative for chest pain, palpitations, orthopnea, claudication, leg swelling and PND.  Gastrointestinal: Negative for abdominal pain, blood in stool, heartburn, melena, nausea and vomiting.  Genitourinary: Negative for hematuria.  Musculoskeletal: Negative for falls and myalgias.  Skin: Negative for rash.  Neurological: Negative for dizziness, tingling, tremors, sensory change, speech change, focal weakness, loss of consciousness and weakness.  Endo/Heme/Allergies: Does not bruise/bleed easily.  Psychiatric/Behavioral: Negative for substance abuse. The patient is not nervous/anxious.   All other systems reviewed and are negative.    PHYSICAL EXAM:  VS:  BP (!) 110/58 (BP Location: Right Arm, Patient Position: Sitting, Cuff Size: Normal)   Pulse 61   Ht _0  (1.676 m)   Wt 149 lb 12 oz (67.9 kg)   BMI 24.17 kg/m  BMI: Body mass index is 24.17  kg/m.  Physical Exam  Constitutional: She is oriented to person, place, and time. She appears well-developed and well-nourished.  HENT:  Head: Normocephalic and atraumatic.  Eyes: Right eye exhibits no discharge. Left eye exhibits no discharge.  Neck: Normal range of motion. No JVD present.  Cardiovascular: Normal rate, regular rhythm, S1 normal and S2 normal. Exam reveals no distant heart sounds, no friction rub, no midsystolic click and no opening snap.  Murmur heard.  Harsh midsystolic murmur is present with a grade of 1/6 at the upper right sternal border radiating to the neck. High-pitched blowing holosystolic murmur of grade 1/6 is also present at the apex. Pulses:      Posterior tibial pulses are 2+ on the right side and 2+ on the left side.  Pulmonary/Chest: Effort normal and breath sounds normal. No respiratory distress. She has no decreased breath sounds. She has no wheezes. She has no rhonchi. She has no rales. She exhibits no tenderness.  Slightly coarse breath sounds throughout  Abdominal: Soft. She exhibits no distension. There is no abdominal tenderness.  Musculoskeletal:        General: No edema.  Neurological: She is alert and oriented to person, place, and time.  Skin: Skin is warm and dry. No cyanosis. Nails show no clubbing.  Psychiatric: She has a normal mood and affect. Her speech is normal and behavior is normal. Judgment and thought content normal.     EKG:  Was ordered and interpreted by me today. Shows atrial paced rhythm with underlying artifact, 61 bpm  Recent Labs: 09/13/2017: Magnesium 2.0 08/08/2018: ALT 13 08/21/2018: B Natriuretic Peptide 778.0 08/22/2018: BUN 17; Creatinine, Ser 1.05; Hemoglobin 9.9; Platelets 219; Potassium 3.9; Sodium 137  No results found for requested labs within last 8760 hours.   Estimated Creatinine Clearance: 40 mL/min (A) (by C-G formula based on SCr of 1.05 mg/dL (H)).   Wt Readings from Last 3 Encounters:  09/04/18 149  lb 12 oz (67.9 kg)  08/22/18 146 lb 11.2 oz (66.5 kg)  08/21/18 154 lb (69.9 kg)     Other studies reviewed: Additional studies/records reviewed today include: summarized above  ASSESSMENT AND PLAN:  1. CAD status post CABG without angina: She is doing well without any symptoms concerning for angina.  Remains on Eliquis in place of aspirin.  Continue with metoprolol and Crestor.  Continue with aggressive secondary prevention.  No plans for ischemic evaluation at this time.  2. Chronic combined CHF: She appears well compensated and euvolemic on exam.  Most recent echocardiogram from 07/2018 demonstrated improved/normal EF.  Remains on Toprol-XL, Entresto, and Lasix.  Blood pressure precludes escalation of evidence-based heart failure therapy at this time.  Could consider addition of spironolactone and follow-up pending renal function.  CHF education.  3. PAF/atrial flutter: Remains in paced rhythm today.  Continue Toprol-XL and amiodarone along with Eliquis.  4. Pulmonary hypertension: Likely in the setting of her comorbid pulmonary diagnoses.  Shortness of breath is much improved.  Continue Lasix 20 mg daily.  Follow-up with pulmonology as directed.  5. Sinus node dysfunction: Pacemaker followed by [facility].  Device appears to be functioning normally today.  6. Chronic respiratory failure with hypoxia secondary to COPD, lung carcinoma with radiation pneumonitis, and recurrent pleural effusion: Stable to much improved.  Remains on supplemental oxygen which is managed by pulmonology.  7. Hypertension: Blood pressure is well controlled today.  Continue current medications.  8. Hyperlipidemia: Continue Crestor with most  recent LDL of 63 from 07/2017.  Disposition: F/u with Dr. Rockey Situ or an APP in 3 months.  Current medicines are reviewed at length with the patient today.  The patient did not have any concerns regarding medicines.  Signed, Christell Faith, PA-C 09/04/2018 2:55 PM       [address] [place], [place] [address] [phone]

## 2018-09-04 ENCOUNTER — Encounter: Payer: Self-pay | Admitting: Physician Assistant

## 2018-09-04 ENCOUNTER — Ambulatory Visit (INDEPENDENT_AMBULATORY_CARE_PROVIDER_SITE_OTHER): Payer: Medicare Other | Admitting: Physician Assistant

## 2018-09-04 VITALS — BP 110/58 | HR 61 | Ht 66.0 in | Wt 149.8 lb

## 2018-09-04 DIAGNOSIS — E785 Hyperlipidemia, unspecified: Secondary | ICD-10-CM

## 2018-09-04 DIAGNOSIS — I10 Essential (primary) hypertension: Secondary | ICD-10-CM

## 2018-09-04 DIAGNOSIS — Z951 Presence of aortocoronary bypass graft: Secondary | ICD-10-CM | POA: Diagnosis not present

## 2018-09-04 DIAGNOSIS — I2510 Atherosclerotic heart disease of native coronary artery without angina pectoris: Secondary | ICD-10-CM

## 2018-09-04 DIAGNOSIS — I483 Typical atrial flutter: Secondary | ICD-10-CM

## 2018-09-04 DIAGNOSIS — I5042 Chronic combined systolic (congestive) and diastolic (congestive) heart failure: Secondary | ICD-10-CM | POA: Diagnosis not present

## 2018-09-04 DIAGNOSIS — I495 Sick sinus syndrome: Secondary | ICD-10-CM

## 2018-09-04 DIAGNOSIS — J9611 Chronic respiratory failure with hypoxia: Secondary | ICD-10-CM

## 2018-09-04 DIAGNOSIS — I480 Paroxysmal atrial fibrillation: Secondary | ICD-10-CM

## 2018-09-04 DIAGNOSIS — I272 Pulmonary hypertension, unspecified: Secondary | ICD-10-CM

## 2018-09-04 NOTE — Patient Instructions (Signed)
Medication Instructions:  Your physician recommends that you continue on your current medications as directed. Please refer to the Current Medication list given to you today.  If you need a refill on your cardiac medications before your next appointment, please call your pharmacy.   Lab work: Your physician recommends that you return for lab work today Artist) If you have labs (blood work) drawn today and your tests are completely normal, you will receive your results only by: Marland Kitchen MyChart Message (if you have MyChart) OR . A paper copy in the mail If you have any lab test that is abnormal or we need to change your treatment, we will call you to review the results.  Testing/Procedures: None ordered   Follow-Up: At [facility], you and your health needs are our priority.  As part of our continuing mission to provide you with exceptional heart care, we have created designated Provider Care Teams.  These Care Teams include your primary Cardiologist (physician) and Advanced Practice Providers (APPs -  Physician Assistants and Nurse Practitioners) who all work together to provide you with the care you need, when you need it. You will need a follow up appointment in 3 months.  Please call our office 2 months in advance to schedule this appointment.  You may see Dr. Rockey Situ  or one of the following Advanced Practice Providers on your designated Care Team:   Murray Hodgkins, NP Christell Faith, PA-C . Marrianne Mood, PA-C

## 2018-09-05 ENCOUNTER — Telehealth: Payer: Self-pay

## 2018-09-05 LAB — BASIC METABOLIC PANEL
BUN/Creatinine Ratio: 13 (ref 12–28)
BUN: 13 mg/dL (ref 8–27)
CHLORIDE: 99 mmol/L (ref 96–106)
CO2: 26 mmol/L (ref 20–29)
Calcium: 8.8 mg/dL (ref 8.7–10.3)
Creatinine, Ser: 0.98 mg/dL (ref 0.57–1.00)
GFR calc Af Amer: 63 mL/min/{1.73_m2} (ref >59)
GFR calc non Af Amer: 55 mL/min/{1.73_m2} — ABNORMAL LOW (ref >59)
Glucose: 93 mg/dL (ref 65–99)
Potassium: 4.4 mmol/L (ref 3.5–5.2)
Sodium: 140 mmol/L (ref 134–144)

## 2018-09-05 NOTE — Telephone Encounter (Signed)
Call to patient to give results of labs, she verbalized understanding and had no questions. No further orders at this time.  Advised pt to call for any further questions or concerns

## 2018-09-05 NOTE — Telephone Encounter (Signed)
-----   Message from Rise Mu, PA-C sent at 09/05/2018  7:43 AM EST ----- Renal function improved back to her baseline.  Potassium at goal.  No changes in medication.

## 2018-09-21 ENCOUNTER — Other Ambulatory Visit: Payer: Self-pay | Admitting: Internal Medicine

## 2018-09-21 DIAGNOSIS — E039 Hypothyroidism, unspecified: Secondary | ICD-10-CM

## 2018-10-16 ENCOUNTER — Other Ambulatory Visit: Payer: Self-pay | Admitting: Cardiovascular Disease

## 2018-10-16 ENCOUNTER — Ambulatory Visit
Admission: RE | Admit: 2018-10-16 | Discharge: 2018-10-16 | Disposition: A | Discharge status: Home / Self Care | Payer: Medicare Other | Source: Ambulatory Visit | Attending: Cardiothoracic Surgery | Admitting: Cardiothoracic Surgery

## 2018-10-16 ENCOUNTER — Ambulatory Visit
Admission: RE | Admit: 2018-10-16 | Discharge: 2018-10-16 | Disposition: A | Discharge status: Home / Self Care | Payer: Medicare Other | Attending: Cardiothoracic Surgery | Admitting: Cardiothoracic Surgery

## 2018-10-16 DIAGNOSIS — J90 Pleural effusion, not elsewhere classified: Secondary | ICD-10-CM | POA: Diagnosis not present

## 2018-10-17 ENCOUNTER — Inpatient Hospital Stay: Payer: Medicare Other

## 2018-10-17 ENCOUNTER — Inpatient Hospital Stay: Payer: Medicare Other | Attending: Hematology and Oncology | Admitting: Hematology and Oncology

## 2018-10-17 ENCOUNTER — Other Ambulatory Visit: Payer: Self-pay | Admitting: Hematology and Oncology

## 2018-10-17 ENCOUNTER — Encounter: Payer: Self-pay | Admitting: Hematology and Oncology

## 2018-10-17 VITALS — BP 136/78 | HR 63 | Temp 97.9°F | Resp 18 | Ht 66.0 in | Wt 146.9 lb

## 2018-10-17 DIAGNOSIS — D649 Anemia, unspecified: Secondary | ICD-10-CM | POA: Diagnosis not present

## 2018-10-17 DIAGNOSIS — E538 Deficiency of other specified B group vitamins: Secondary | ICD-10-CM

## 2018-10-17 DIAGNOSIS — C3401 Malignant neoplasm of right main bronchus: Secondary | ICD-10-CM

## 2018-10-17 LAB — CBC WITH DIFFERENTIAL/PLATELET
Abs Immature Granulocytes: 0.02 10*3/uL (ref 0.00–0.07)
Basophils Absolute: 0 10*3/uL (ref 0.0–0.1)
Basophils Relative: 1 %
EOS PCT: 4 %
Eosinophils Absolute: 0.2 10*3/uL (ref 0.0–0.5)
HEMATOCRIT: 37.2 % (ref 36.0–46.0)
Hemoglobin: 11.5 g/dL — ABNORMAL LOW (ref 12.0–15.0)
Immature Granulocytes: 1 %
LYMPHS ABS: 0.5 10*3/uL — AB (ref 0.7–4.0)
Lymphocytes Relative: 13 %
MCH: 29.5 pg (ref 26.0–34.0)
MCHC: 30.9 g/dL (ref 30.0–36.0)
MCV: 95.4 fL (ref 80.0–100.0)
Monocytes Absolute: 0.6 10*3/uL (ref 0.1–1.0)
Monocytes Relative: 15 %
Neutro Abs: 2.7 10*3/uL (ref 1.7–7.7)
Neutrophils Relative %: 66 %
Platelets: 249 10*3/uL (ref 150–400)
RBC: 3.9 MIL/uL (ref 3.87–5.11)
RDW: 15.9 % — ABNORMAL HIGH (ref 11.5–15.5)
WBC: 3.9 10*3/uL — ABNORMAL LOW (ref 4.0–10.5)
nRBC: 0 % (ref 0.0–0.2)

## 2018-10-17 LAB — COMPREHENSIVE METABOLIC PANEL
ALT: 14 U/L (ref 0–44)
AST: 27 U/L (ref 15–41)
Albumin: 3.7 g/dL (ref 3.5–5.0)
Alkaline Phosphatase: 83 U/L (ref 38–126)
Anion gap: 7 (ref 5–15)
BUN: 12 mg/dL (ref 8–23)
CO2: 32 mmol/L (ref 22–32)
Calcium: 9 mg/dL (ref 8.9–10.3)
Chloride: 101 mmol/L (ref 98–111)
Creatinine, Ser: 1.03 mg/dL — ABNORMAL HIGH (ref 0.44–1.00)
GFR calc Af Amer: 59 mL/min — ABNORMAL LOW (ref >60)
GFR calc non Af Amer: 51 mL/min — ABNORMAL LOW (ref >60)
Glucose, Bld: 105 mg/dL — ABNORMAL HIGH (ref 70–99)
Potassium: 3.9 mmol/L (ref 3.5–5.1)
Sodium: 140 mmol/L (ref 135–145)
Total Bilirubin: 0.8 mg/dL (ref 0.3–1.2)
Total Protein: 7.5 g/dL (ref 6.5–8.1)

## 2018-10-17 LAB — RETICULOCYTES
Immature Retic Fract: 15.6 % (ref 2.3–15.9)
RBC.: 3.93 MIL/uL (ref 3.87–5.11)
Retic Count, Absolute: 84.1 10*3/uL (ref 19.0–186.0)
Retic Ct Pct: 2.1 % (ref 0.4–3.1)

## 2018-10-17 LAB — VITAMIN B12: Vitamin B-12: 298 pg/mL (ref 180–914)

## 2018-10-17 NOTE — Progress Notes (Signed)
No new changes noted today 

## 2018-10-17 NOTE — Progress Notes (Signed)
[facility] day:  10/17/2018  Chief Complaint: Jenny Cooper is a 83 y.o. female with stage IIIA squamous cell lung cancer who is seen for new patient assessment.  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  Imaging revealed compression of the right mainstem bronchus secondary to enlarged lymph nodes ana a mass.  She underwent bronchoscopy and endobronchial ultrasound (EBUS) on 09/24/2014.  Biopsy from the hilar region and lymph node station 4R confirmed moderately differentiated squamous cell lung cancer.  Clinical stage was T4N1M0.  She received radiation with concurrent weekly carboplatin and Taxol from 10/21/2014 - 11/26/2014.  PET scan revealed significant response.  She began consolidation chemotherapy.    She received 2 cycles of full dose carboplatin and Taxol disease (completed in 02/2015).  Bronchoscopy and endobronchial navigational bronchoscopy (ENB) was negative on 10/06/2015.  Chest CT at [facility] on 05/06/2016 revealed a 5 cm hilar mass with bilateral ground glass opacities.  She was diagnosed with radiation pneumonitis by Dr. Stevenson Clinch and started on prednisone.  Chest CT on 06/06/2018 revealed a 9.2 x 5.4 cm masslike right perihilar consolidation (previously 7.0 x 5.2 cm). Local tumor recurrence was a consideration, although compressive atelectasis due to the increased right pleural effusion was on the differential.  There was a moderate dependent right pleural effusion, significantly increased since 12/08/2017 chest CT.  There was new trace dependent left pleural effusion.  There was nonspecific mild patchy ground-glass opacity in the dependent left lower lobe, favor inflammatory or hypoventilatory change.  PET scan on 06/19/2018 revealed increase consolidation about the RIGHT hilum with mild to moderate metabolic activity is favored benign post therapy change.  There was a large RIGHT  effusion, unchanged.  She developed recurrent plueral effusions in 06/2018. She underwent bronchoscopy, right thoracoscopy with pleural biopsy, talc insufflation for pleurodesis, and insertion of a Pleurx catheter on 07/11/2018 by Dr. Genevive Bi.  Pleural biopsy revealed no evidence of malignancy.  Pleurx catheter was removed on 08/17/2018.   Foundation One testing on 12/28/2016 revealed no targettable mutations (EGFR, KRAS, RET, ALK, MET, ERBB2, BRAF, ROS1).  She had Int-TMB 8 muts/Mb.  Genomic alterations included ARID2 Q1695f*8, ATR D5156f11, NFE2L2 L390F, and TP53 R282G.  PDL-1 was ?. Marland KitchenPatient last seen by Dr. BrRogue Bussingn 08/15/2018.  At that time, she denies any worsening shortness of breath or cough.  She denied any lower extremity swelling.  She remained on oxygen.  [facility] with acute on chronic respiratory failure in the setting of CHF.  She requires oxygen 2 liters/[place] at baseline.  BNP of 778. CXR with bilateral interstitial fibrosis with possible pulmonary edema. Troponin negative x 1. EF of 5570-96%grade 2 diastolic dysfunction, mild AS, mild to moderate MR, mildly dilated left atrium, PASP 63 mmHg, trivial pericardial effusion. She was treated for AECOPD and diuresed. WIth diuresis, BUN/Cr trending increased from 12/0.87 to 17/1.05. Hemoglobin was low, though relatively stable with a trend of 10.3 to 9.9. Discharge WBC was 3200. Discharge weight was 66.5 kg. She was scheduled for follow-up with Dr. GoRockey Situ She was on metoprolol and Eliquis for chronic atrial fibrillation.  [facility]  Notes reviewed.  She appeared well compensated and euvolemic on exam.  She remained on Toprol-XL, Entresto, and Lasix. Regarding her PAF/atrial flutter, she remained in paced rhythm.  She continues on amiodarone with  Eliquis.   CXR on 10/16/2018 revealed stable mild cardiomegaly without pulmonary edema.  There was no significant  pleural effusions.  There was stable bibasilar reticular opacities compatible with nonspecific fibrosis.  There were hyperinflated lungs, suggesting COPD.  During the interim, she has felt better since her evaluation in 07/2018.  She is not as short of breath.  She is on 2 liters of oxygen via nasal cannulae.  She denies any chest pain or palpitations.  Diet is good.   Past Medical History:  Diagnosis Date  . Acute respiratory failure with hypoxia ([place]) 12/11/2017  . Atypical atrial flutter ([place])    a. s/p ablation 07/27/2013 followed by Dr. Rockey Situ  . Balance problem   . CAD (coronary artery disease)    a. s/p MI x 2 in 2002 s/p PCI x 2 in 2002; b. s/p 2v CABG 2002; c. stress echo 07/2004 w/ evi of pos & inf infarct & no evi of ischemia; d. 4/08 dipyridamole scan w/ multiple areas of infarct, no ischemia, EF 49%; e. cath 04/28/15 3v CAD, med Rx rec, no targets for revasc, LM lum irregs, pLAD 30%, 100%, ost-pLCx 60%, [name] 99%, OM2 100%, p-mRCA 90%, [name] 100% L-R collats, VG-mLAD irregs, VG-OM2 oc  . Carcinoma of right lung ([place]) 01/03/2015   a. followed by Dr. Oliva Bustard  . Chronic systolic CHF (congestive heart failure) ([place])    a. echo 03/2015: EF 30-35%, sev ant/inf/pos HK, in mild to mod MR  . Complication of anesthesia    more recently patient oxygen levels do not rebound as quickly  . Compressed spine fracture ([place]) 08/06/2016   lumbar 2, t11, t12  . COPD (chronic obstructive pulmonary disease) ([place])   . Fractured pelvis ([place]) 05/26/2016   2 places  . GERD (gastroesophageal reflux disease)   . History of blood clots    12/2001 left leg  . History of colonoscopy 2013  . History of mammography, screening 2015  . History of Papanicolaou smear of cervix 2013  . HLD (hyperlipidemia)   . HTN (hypertension)   . Hypothyroidism   . Lung cancer ([place])   . Mitral regurgitation    a. s/p mitral ring placement 09/2000; b. echo 09/2010: EF 50%, inf HK, post AK, mild MR, prosthetic mitral valve ring w/  peak gradient of 10 mmHg; b. echo 2/13: EF 50%, mild MR/TR     . Myocardial infarction ([place])    X 2 (LAST ONE IN 2002)  . Neuropathy   . Pacemaker    a. MDT 2002; b. generator replacement 2013; c. followed by Dr. Omelia Blackwater, MD  . PAF (paroxysmal atrial fibrillation) ([place])    a. on Eliquis   . Personal history of chemotherapy   . Personal history of radiation therapy   . Pneumonia     Past Surgical History:  Procedure Laterality Date  . ABLATION  04/2016   [facility]  . APPENDECTOMY    . CARDIAC CATHETERIZATION N/A 04/28/2015   Procedure: Left Heart Cath and Coronary Angiography;  Surgeon: Wellington Hampshire, MD;  Location: [place] [facility];  Service: Cardiovascular;  Laterality: N/A;  . CARDIOVERSION N/A 04/08/2017   Procedure: CARDIOVERSION;  Surgeon: Wellington Hampshire, MD;  Location: [facility];  Service: Cardiovascular;  Laterality: N/A;  . CHEST TUBE INSERTION N/A 07/11/2018   Procedure: PLEURX CATH INSERTION;  Surgeon: [name], MD;  Location: [facility];  Service: Thoracic;  Laterality: N/A;  . COLONOSCOPY  12/2009   2 small tubular adenomas  .  CORONARY ARTERY BYPASS GRAFT  09/2000  . DRAIN REMOVAL Right 08/10/2018   Procedure: DRAIN REMOVAL;  Surgeon: [name], MD;  Location: [facility];  Service: General;  Laterality: Right;  . ECTOPIC PREGNANCY SURGERY    . ELECTROMAGNETIC NAVIGATION BROCHOSCOPY N/A 10/06/2015   Procedure: ELECTROMAGNETIC NAVIGATION BRONCHOSCOPY;  Surgeon: Flora Lipps, MD;  Location: [facility];  Service: Cardiopulmonary;  Laterality: N/A;  . ENDOBRONCHIAL ULTRASOUND N/A 10/06/2015   Procedure: ENDOBRONCHIAL ULTRASOUND;  Surgeon: Flora Lipps, MD;  Location: [facility];  Service: Cardiopulmonary;  Laterality: N/A;  . FLEXIBLE BRONCHOSCOPY Right 07/11/2018   Procedure: FLEXIBLE BRONCHOSCOPY;  Surgeon: [name], MD;  Location: [facility];  Service: Thoracic;  Laterality: Right;  . KYPHOPLASTY N/A 09/28/2016   Procedure: KYPHOPLASTY;  Surgeon: Hessie Knows, MD;   Location: [facility];  Service: Orthopedics;  Laterality: N/A;  . KYPHOPLASTY N/A 02/20/2018   Procedure: [id number];  Surgeon: Hessie Knows, MD;  Location: [facility];  Service: Orthopedics;  Laterality: N/A;  . PACEMAKER INSERTION  03/2012  . thorocentesis  12/24/2016  . VAGINAL HYSTERECTOMY     partial - left ovary remains  . VIDEO ASSISTED THORACOSCOPY Right 07/11/2018   Procedure: VIDEO ASSISTED THORACOSCOPY;  Surgeon: [name], MD;  Location: [facility];  Service: Thoracic;  Laterality: Right;  Marland Kitchen VIDEO ASSISTED THORACOSCOPY (VATS) W/TALC PLEUADESIS Right 07/11/2018   Procedure: THORACOTOMY PLEURAL BIOPSY WITH TALC PLEURODESIS;  Surgeon: [name], MD;  Location: [facility];  Service: Thoracic;  Laterality: Right;  pleural biopsy    Family History  Problem Relation Age of Onset  . COPD Mother        sister, and brother  . Lung disease Father   . Stroke Maternal Grandmother   . Hypertension Sister   . COPD Sister   . Hypertension Brother   . COPD Brother   . Colon cancer Brother   . Heart attack Neg Hx   . Breast cancer Neg Hx     Social History:  reports that she quit smoking about 18 years ago. Her smoking use included cigarettes. She has a 40.00 pack-year smoking history. She has never used smokeless tobacco. She reports previous alcohol use of about 10.0 standard drinks of alcohol per week. She reports that she does not use drugs.  The patient is accompanied by her son, Jenny Cooper, today.  Allergies:  Allergies  Allergen Reactions  . Lovenox [Enoxaparin Sodium] Itching    Current Medications: Current Outpatient Medications  Medication Sig Dispense Refill  . amiodarone (PACERONE) 200 MG tablet Take 1 tablet (200 mg total) by mouth daily. 90 tablet 3  . apixaban (ELIQUIS) 5 MG TABS tablet Take 1 tablet (5 mg total) by mouth 2 (two) times daily. 180 tablet 6  . budesonide (PULMICORT) 0.5 MG/2ML nebulizer solution Take 2 mLs (0.5 mg total) by nebulization 2 (two) times  daily. 360 mL 5  . Calcium Carb-Cholecalciferol (CALCIUM 600-D PO) Take 1 tablet by mouth daily.     . cetirizine (ZYRTEC) 10 MG tablet TAKE (1) TABLET BY MOUTH EVERY DAY (Patient taking differently: Take 10 mg by mouth daily. ) 30 tablet 12  . ENTRESTO 24-26 MG TAKE (1) TABLET BY MOUTH TWICE DAILY 60 tablet 6  . furosemide (LASIX) 20 MG tablet Take 1 tablet (20 mg total) by mouth daily. 30 tablet 11  . levothyroxine (SYNTHROID, LEVOTHROID) 88 MCG tablet TAKE (1) TABLET BY MOUTH EVERY DAY BEFORE BREAKFAST 30 tablet 5  . Multiple Vitamins-Minerals (CENTRUM SILVER PO) Take 1 tablet by mouth  daily.    . Revefenacin (YUPELRI) 175 MCG/3ML SOLN Inhale 3 mLs into the lungs daily. DX: J76.77 90 mL 11  . senna (SENOKOT) 8.6 MG TABS tablet Take 1 tablet (8.6 mg total) by mouth daily. 30 each 0  . acetaminophen (TYLENOL) 500 MG tablet Take 1,000 mg by mouth every 6 (six) hours as needed for mild pain or moderate pain.     . metoprolol succinate (TOPROL-XL) 25 MG 24 hr tablet TAKE ONE TABLET BY MOUTH EVERY MORNING AND TAKE TWO TABLETS BY MOUTH EVERY EVENING 270 tablet 3  . rosuvastatin (CRESTOR) 5 MG tablet Take 1 tablet (5 mg total) by mouth daily at 6 PM. 90 tablet 3   No current facility-administered medications for this visit.    Facility-Administered Medications Ordered in Other Visits  Medication Dose Route Frequency Provider Last Rate Last Dose  . sodium chloride 0.9 % injection 10 mL  10 mL Intravenous PRN Forest Gleason, MD   10 mL at 02/19/15 1000  . sodium chloride flush (NS) 0.9 % injection 10 mL  10 mL Intravenous PRN Cammie Sickle, MD   10 mL at 02/16/16 1048    Review of Systems:  GENERAL:  Feels better.  No fevers, sweats. Weight down 8 pounds. PERFORMANCE STATUS (ECOG):  2 HEENT:  No visual changes, runny nose, sore throat, mouth sores or tenderness. Lungs: COPD.  H/o recurrent right sided pleural effusion.  Shortness of breath, improved.  Chronic cough.  No hemoptysis.  On 2  liters oxygen via [employer]. Cardiac:  No chest pain, palpitations, orthopnea, or PND. GI:  No nausea, vomiting, diarrhea, constipation, melena or hematochezia. GU:  No urgency, frequency, dysuria, or hematuria. Musculoskeletal:  Osteoporosis.  No back pain.  No joint pain.  No muscle tenderness. Extremities:  No pain or swelling. Skin:  No rashes or skin changes. Neuro:  Neuropathy in toes, left arm and hand.  No headache, focal weakness, balance or coordination issues. Endocrine:  No diabetes.  Thyroid disease on Synthroid.  No hot flashes or night sweats. Psych:  No mood changes, depression or anxiety. Pain:  No focal pain. Review of systems:  All other systems reviewed and found to be negative.  Physical Exam: Blood pressure 136/78, pulse 63, temperature 97.9 F (36.6 C), temperature source Tympanic, resp. rate 18, height _0  (1.676 m), weight 146 lb 15 oz (66.7 kg), SpO2 100 %. GENERAL: Thin chronically ill appearing woman sitting comfortably in the exam room in no acute distress.  She needs assistance onto the exam table. MENTAL STATUS:  Alert and oriented to person, place and time. HEAD:  Graying hair.  Normocephalic, atraumatic, face symmetric, no Cushingoid features. EYES:  Brown eyes.  Pupils equal round and reactive to light and accomodation.  No conjunctivitis or scleral icterus. ENT:  [place] in place.  Oropharynx clear without lesion.  Tongue normal. Mucous membranes moist.  RESPIRATORY:  Clear to auscultation without rales, wheezes or rhonchi. CARDIOVASCULAR:  Regular rate and rhythm without murmur, rub or gallop. CHEST:  Pacemaker. ABDOMEN:  Soft, non-tender, with active bowel sounds, and no hepatosplenomegaly.  No masses. SKIN:  Pale.  No rashes, ulcers or lesions. EXTREMITIES: No edema, no skin discoloration or tenderness.  No palpable cords. LYMPH NODES: No palpable cervical, supraclavicular, axillary or inguinal adenopathy  NEUROLOGICAL: Unremarkable. PSYCH:   Appropriate.   Appointment on 10/17/2018  Component Date Value Ref Range Status  . Retic Ct Pct 10/17/2018 2.1  0.4 - 3.1 % Final  . RBC.  10/17/2018 3.93  3.87 - 5.11 MIL/uL Final  . Retic Count, Absolute 10/17/2018 84.1  19.0 - 186.0 K/uL Final  . Immature Retic Fract 10/17/2018 15.6  2.3 - 15.9 % Final   Performed at [facility], [address], [place], [place] [address]  . TSH 10/17/2018 5.657* 0.350 - 4.500 uIU/mL Final   Comment: Performed by a 3rd Generation assay with a functional sensitivity of <=0.01 uIU/mL. Performed at [facility], [address], [place], [place] [address]   . Folate 10/17/2018 40.0  >5.9 ng/mL Final   Performed at [facility], [address] [place], [place] [address]  . Vitamin B-12 10/17/2018 298  180 - 914 pg/mL Final   Comment: (NOTE) This assay is not validated for testing neonatal or myeloproliferative syndrome specimens for Vitamin B12 levels. Performed at [facility], [place] [address] [place], [place] [address]   . Iron 10/17/2018 59  28 - 170 ug/dL Final  . TIBC 10/17/2018 293  250 - 450 ug/dL Final  . Saturation Ratios 10/17/2018 20  10.4 - 31.8 % Final  . UIBC 10/17/2018 234  ug/dL Final   Performed at [facility], [address], [place], [place] [address]  . Ferritin 10/17/2018 98  11 - 307 ng/mL Final   Performed at [facility], [address] [place], [place] [address]  . Sodium 10/17/2018 140  135 - 145 mmol/L Final  . Potassium 10/17/2018 3.9  3.5 - 5.1 mmol/L Final  . Chloride 10/17/2018 101  98 - 111 mmol/L Final  . CO2 10/17/2018 32  22 - 32 mmol/L Final  . Glucose, Bld 10/17/2018 105* 70 - 99 mg/dL Final  . BUN 10/17/2018 12  8 - 23 mg/dL Final  . Creatinine, Ser 10/17/2018 1.03* 0.44 - 1.00 mg/dL Final  . Calcium 10/17/2018 9.0  8.9 - 10.3 mg/dL Final  . Total Protein 10/17/2018 7.5  6.5 - 8.1 g/dL Final  . Albumin 10/17/2018 3.7  3.5 - 5.0 g/dL Final  . AST  10/17/2018 27  15 - 41 U/L Final  . ALT 10/17/2018 14  0 - 44 U/L Final  . Alkaline Phosphatase 10/17/2018 83  38 - 126 U/L Final  . Total Bilirubin 10/17/2018 0.8  0.3 - 1.2 mg/dL Final  . GFR calc non Af Amer 10/17/2018 51* >60 mL/min Final  . GFR calc Af Amer 10/17/2018 59* >60 mL/min Final  . Anion gap 10/17/2018 7  5 - 15 Final   Performed at [facility], [address] [place], [place] [address]  . WBC 10/17/2018 3.9* 4.0 - 10.5 K/uL Final  . RBC 10/17/2018 3.90  3.87 - 5.11 MIL/uL Final  . Hemoglobin 10/17/2018 11.5* 12.0 - 15.0 g/dL Final  . HCT 10/17/2018 37.2  36.0 - 46.0 % Final  . MCV 10/17/2018 95.4  80.0 - 100.0 fL Final  . MCH 10/17/2018 29.5  26.0 - 34.0 pg Final  . MCHC 10/17/2018 30.9  30.0 - 36.0 g/dL Final  . RDW 10/17/2018 15.9* 11.5 - 15.5 % Final  . Platelets 10/17/2018 249  150 - 400 K/uL Final  . nRBC 10/17/2018 0.0  0.0 - 0.2 % Final  . Neutrophils Relative % 10/17/2018 66  % Final  . Neutro Abs 10/17/2018 2.7  1.7 - 7.7 K/uL Final  . Lymphocytes Relative 10/17/2018 13  % Final  . Lymphs Abs 10/17/2018 0.5* 0.7 - 4.0 K/uL Final  . Monocytes Relative 10/17/2018 15  %  Final  . Monocytes Absolute 10/17/2018 0.6  0.1 - 1.0 K/uL Final  . Eosinophils Relative 10/17/2018 4  % Final  . Eosinophils Absolute 10/17/2018 0.2  0.0 - 0.5 K/uL Final  . Basophils Relative 10/17/2018 1  % Final  . Basophils Absolute 10/17/2018 0.0  0.0 - 0.1 K/uL Final  . Immature Granulocytes 10/17/2018 1  % Final  . Abs Immature Granulocytes 10/17/2018 0.02  0.00 - 0.07 K/uL Final   Performed at [facility], [address], [place], [place] [address]    Assessment:  JAZLENE BARES is a 83 y.o. female with stage IIIA squamous cell carcinoma of the left lung s/p bronchoscopy and endobronchial ultrasound (EBUS) on 09/24/2014.  Pathology from the hilar region and lymph node station 4R confirmed moderately differentiated squamous cell lung cancer.  Clinical stage  was T4N1M0.  Foundation One testing on 12/28/2016 revealed no targettable mutations (EGFR, KRAS, RET, ALK, MET, ERBB2, BRAF, ROS1).  She had Int-TMB 8 muts/Mb.  Genomic alterations included ARID2 Q1615f*8, ATR D512f11, NFE2L2 L390F, and TP53 R282G.  PDL-1 was ?. Marland KitchenShe received radiation with concurrent weekly carboplatin and Taxol (10/21/2014 - 11/26/2014).  PET scan revealed significant response.  She received 2 cycles of full dose carboplatin and Taxol disease (completed in 03/05/2015).  Bronchoscopy and endobronchial navigational bronchoscopy (ENB) was negative on 10/06/2015.  Chest CT on 06/06/2018 revealed a 9.2 x 5.4 cm masslike right perihilar consolidation (previously 7.0 x 5.2 cm). Local tumor recurrence was a consideration, although compressive atelectasis due to the increased right pleural effusion was on the differential.  There was a moderate dependent right pleural effusion, significantly increased since 12/08/2017 chest CT.  There was new trace dependent left pleural effusion.  There was nonspecific mild patchy ground-glass opacity in the dependent left lower lobe, favor inflammatory or hypoventilatory change.  PET scan on 06/19/2018 revealed increase consolidation about the RIGHT hilum with mild to moderate metabolic activity is favored benign post therapy change.  There was a large RIGHT effusion, unchanged.  She developed recurrent plueral effusions in 06/2018. She underwent bronchoscopy, right thoracoscopy with pleural biopsy, talc pleurodesis, and insertion of a Pleurx catheter on 07/11/2018.  Pleural biopsy revealed no evidence of malignancy.  Pleurx catheter was removed on 08/17/2018.   [facility] 08[facility]2018 with acute on chronic respiratory failure in the setting of CHF.  CXR with bilateral interstitial fibrosis with possible pulmonary edema. Troponin was negative x 1. EF of 5578-93%grade 2 diastolic dysfunction, mild AS, mild to moderate MR,  mildly dilated left atrium, PASP 63 mmHg, trivial pericardial effusion. She was treated for AECOPD and diuresed.   CXR on 10/16/2018 revealed stable mild cardiomegaly without pulmonary edema.  There was no significant pleural effusions.  There was stable bibasilar reticular opacities compatible with nonspecific fibrosis.  There were hyperinflated lungs, suggesting COPD.  She has a chronic normocytic anemia.  Diet is good.  She denies any bleeding.  She has chronic atrial fibrillation and is on Eliquis.  She is s/p mitral valve ring replacement in 2002.  She has a history of back pain s/p kyphoplasty.  She has stage III chronic kidney disease.  Creatinine is 1.03.  Symptomatically, she is feeling better.  She is less short of breath.  She remains on 2 liters of oxygen via [place].  Plan: 1.   Labs today:  CBC with diff, CMP.  2.   Stage IIIA squamous cell lung cancer  Discuss entire  medical history, diagnosis and management of lung cancer.  She is s/p concurrent chemotherapy and radiation followed 2 cycles of consolidation chemotherapy.  Course complicated by radiation pneumonitis.  Course complicated by recurrent pleural effusion s/p talc pleurodesis in 06/2018.   Pleural biopsy and pleural fluid negative for malignancy.  CXR from 10/16/2018 reviewed.  There are stable bibasilar opacities.  Discuss plan for f/u CXR in 3 months and chest CT in 6 months unless interval concerns.  Follow-up PDL-1 testing (unclear if ordered).  Continue surveillance. 3.   Normocytic anemia  Hematocrit 37.2.  Hemoglobin 11.5 (improved).  Check ferritin, iron studies, B12, folate, TSH. 4.   CXR (PA and lateral) in 3 months. 5.   RTC in 3 months (after CXR) for MD assessment, labs (CBC with diff, CMP), and review of CXR.  I discussed the assessment and treatment plan with the patient.  The patient was provided an opportunity to ask questions and all were answered.  The patient agreed with the plan and demonstrated an  understanding of the instructions.  The patient was advised to call back or seek an in person evaluation if the symptoms worsen or if the condition fails to improve as anticipated.    Lequita Asal, MD  10/17/2018, 4:35 PM

## 2018-10-18 ENCOUNTER — Telehealth: Payer: Self-pay

## 2018-10-18 LAB — FERRITIN: Ferritin: 98 ng/mL (ref 11–307)

## 2018-10-18 LAB — FOLATE: Folate: 40 ng/mL (ref >5.9)

## 2018-10-18 LAB — IRON AND TIBC
Iron: 59 ug/dL (ref 28–170)
Saturation Ratios: 20 % (ref 10.4–31.8)
TIBC: 293 ug/dL (ref 250–450)
UIBC: 234 ug/dL

## 2018-10-18 LAB — TSH: TSH: 5.657 u[IU]/mL — ABNORMAL HIGH (ref 0.350–4.500)

## 2018-10-18 NOTE — Telephone Encounter (Signed)
On the patient voice mail. I have also informed the patient to contact our office for any question or concerns.

## 2018-10-18 NOTE — Telephone Encounter (Signed)
-----   Message from Lequita Asal, MD sent at 10/18/2018  9:27 AM EST ----- Regarding: Please call patient  B12 is low.  Begin oral B12.  Check B12 level in 1 month.  M ----- Message ----- From: Buel Ream, Lab [facility] [place] Sent: 10/17/2018   3:57 PM EST To: Lequita Asal, MD

## 2018-10-18 NOTE — Telephone Encounter (Signed)
Left a message on the patient voice mail to inform her that her B-12 levels are low and she will need to start oral b-12 1,000 mcg daily and the patient is to come in the office and get labs in 1 month to recheck her B-12 levels. I will continue to reach out to the patient to make ure she got the message.

## 2018-11-08 ENCOUNTER — Ambulatory Visit: Payer: Medicare Other | Admitting: Internal Medicine

## 2018-11-09 ENCOUNTER — Other Ambulatory Visit: Payer: Self-pay | Admitting: Cardiovascular Disease

## 2018-11-20 ENCOUNTER — Other Ambulatory Visit: Payer: Self-pay | Admitting: Cardiovascular Disease

## 2018-11-28 ENCOUNTER — Telehealth: Payer: Self-pay

## 2018-11-28 NOTE — Telephone Encounter (Signed)
LMOV for patient to call back.  Need to change to an Evisit.  Telephone or video visit will be okay.

## 2018-11-29 ENCOUNTER — Telehealth: Payer: Self-pay

## 2018-11-29 NOTE — Telephone Encounter (Signed)
Virtual Visit Pre-Appointment Phone Call  Steps For Call:  1. Confirm consent - "In the setting of the current Covid19 crisis, you are scheduled for a VIDEO visit with your provider on 12/01/2018 at 10:40am.  Just as we do with many in-office visits, in order for you to participate in this visit, we must obtain consent.  If you'd like, I can send this to your mychart (if signed up) or email for you to review.  Otherwise, I can obtain your verbal consent now.  All virtual visits are billed to your insurance company just like a normal visit would be.  By agreeing to a virtual visit, we'd like you to understand that the technology does not allow for your provider to perform an examination, and thus may limit your provider's ability to fully assess your condition.  Finally, though the technology is pretty good, we cannot assure that it will always work on either your or our end, and in the setting of a video visit, we may have to convert it to a phone-only visit.  In either situation, we cannot ensure that we have a secure connection.  Are you willing to proceed?"  2. Give patient instructions for WebEx download to smartphone as below if video visit  3. Advise patient to be prepared with any vital sign or heart rhythm information, their current medicines, and a piece of paper and pen handy for any instructions they may receive the day of their visit  4. Inform patient they will receive a phone call 15 minutes prior to their appointment time (may be from unknown caller ID) so they should be prepared to answer  5. Confirm that appointment type is correct in Epic appointment notes (video vs telephone)    TELEPHONE CALL NOTE  Jenny Cooper has been deemed a candidate for a follow-up tele-health visit to limit community exposure during the Covid-19 pandemic. I spoke with the patient via phone to ensure availability of phone/video source, confirm preferred email & phone number, and discuss  instructions and expectations.  I reminded Jenny Cooper to be prepared with any vital sign and/or heart rhythm information that could potentially be obtained via home monitoring, at the time of her visit. I reminded Jenny Cooper to expect a phone call at the time of her visit if her visit.  Did the patient verbally acknowledge consent to treatment? YES  Janan Ridge, [place] 11/29/2018 2:46 PM   DOWNLOADING THE [place], go to [employer] and type in WebEx in the search bar. [place] [employer], the blue/green circle. The app is free but as with any other app downloads, their phone may require them to verify saved payment information or Apple password. The patient does NOT have to create an account.  - If Android, ask patient to go to [employer] and type in WebEx in the search bar. [place] [employer], the blue/green circle. The app is free but as with any other app downloads, their phone may require them to verify saved payment information or Android password. The patient does NOT have to create an account.   CONSENT FOR TELE-HEALTH VISIT - PLEASE REVIEW  I hereby voluntarily request, consent and authorize [facility] and its employed or contracted physicians, physician assistants, nurse practitioners or other licensed health care professionals (the Practitioner), to provide me with telemedicine health care services (the "Services") as deemed necessary by the treating Practitioner. I acknowledge  and consent to receive the Services by the Practitioner via telemedicine. I understand that the telemedicine visit will involve communicating with the Practitioner through live audiovisual communication technology and the disclosure of certain medical information by electronic transmission. I acknowledge that I have been given the opportunity to request an in-person assessment or other available alternative prior to the telemedicine visit  and am voluntarily participating in the telemedicine visit.  I understand that I have the right to withhold or withdraw my consent to the use of telemedicine in the course of my care at any time, without affecting my right to future care or treatment, and that the Practitioner or I may terminate the telemedicine visit at any time. I understand that I have the right to inspect all information obtained and/or recorded in the course of the telemedicine visit and may receive copies of available information for a reasonable fee.  I understand that some of the potential risks of receiving the Services via telemedicine include:  Marland Kitchen Delay or interruption in medical evaluation due to technological equipment failure or disruption; . Information transmitted may not be sufficient (e.g. poor resolution of images) to allow for appropriate medical decision making by the Practitioner; and/or  . In rare instances, security protocols could fail, causing a breach of personal health information.  Furthermore, I acknowledge that it is my responsibility to provide information about my medical history, conditions and care that is complete and accurate to the best of my ability. I acknowledge that Practitioner's advice, recommendations, and/or decision may be based on factors not within their control, such as incomplete or inaccurate data provided by me or distortions of diagnostic images or specimens that may result from electronic transmissions. I understand that the practice of medicine is not an exact science and that Practitioner makes no warranties or guarantees regarding treatment outcomes. I acknowledge that I will receive a copy of this consent concurrently upon execution via email to the email address I last provided but may also request a printed copy by calling the office of [place].    I understand that my insurance will be billed for this visit.   I have read or had this consent read to me. . I understand  the contents of this consent, which adequately explains the benefits and risks of the Services being provided via telemedicine.  . I have been provided ample opportunity to ask questions regarding this consent and the Services and have had my questions answered to my satisfaction. . I give my informed consent for the services to be provided through the use of telemedicine in my medical care  By participating in this telemedicine visit I agree to the above.

## 2018-11-30 NOTE — Telephone Encounter (Signed)
Lots of issues to go over based on recent MyChart message Can we schedule her for web evisit thx TG

## 2018-12-01 ENCOUNTER — Telehealth (INDEPENDENT_AMBULATORY_CARE_PROVIDER_SITE_OTHER): Payer: Medicare Other | Admitting: Cardiovascular Disease

## 2018-12-01 ENCOUNTER — Encounter: Payer: Self-pay | Admitting: Cardiovascular Disease

## 2018-12-01 ENCOUNTER — Other Ambulatory Visit: Payer: Self-pay

## 2018-12-01 VITALS — BP 118/58 | HR 61 | Ht 66.0 in | Wt 147.2 lb

## 2018-12-01 DIAGNOSIS — I480 Paroxysmal atrial fibrillation: Secondary | ICD-10-CM | POA: Diagnosis not present

## 2018-12-01 DIAGNOSIS — I10 Essential (primary) hypertension: Secondary | ICD-10-CM

## 2018-12-01 DIAGNOSIS — I483 Typical atrial flutter: Secondary | ICD-10-CM

## 2018-12-01 DIAGNOSIS — I272 Pulmonary hypertension, unspecified: Secondary | ICD-10-CM | POA: Insufficient documentation

## 2018-12-01 DIAGNOSIS — J9611 Chronic respiratory failure with hypoxia: Secondary | ICD-10-CM | POA: Insufficient documentation

## 2018-12-01 DIAGNOSIS — I5042 Chronic combined systolic (congestive) and diastolic (congestive) heart failure: Secondary | ICD-10-CM

## 2018-12-01 NOTE — Progress Notes (Addendum)
Virtual Visit via Video Note   This visit type was conducted due to national recommendations for restrictions regarding the COVID-19 Pandemic (e.g. social distancing) in an effort to limit this patient's exposure and mitigate transmission in our community.  Due to her co-morbid illnesses, this patient is at least at moderate risk for complications without adequate follow up.  This format is felt to be most appropriate for this patient at this time.  All issues noted in this document were discussed and addressed.  A limited physical exam was performed with this format.  Please refer to the patient's chart for her consent to telehealth for [facility].    Date:  12/01/2018   ID:  Jenny Cooper, DOB Apr 30, 1936, MRN [id number]  Patient Location:  [place] [address]   Provider location:   [facility], [place] office  PCP:  Glean Hess, MD  Cardiologist:  Arvid Right [facility]  Chief Complaint:  SOB, cough, tachycardia rate 120  Webcam visit  History of Present Illness:    Jenny Cooper is a 83 y.o. female who presents via audio/video conferencing for a telehealth visit today.   The patient does not symptoms concerning for COVID-19 infection (fever, chills, cough, or new SHORTNESS OF BREATH).   Patient has a past medical history of  CAD  s/p remote history of MI in 2002  2 vessel CABG post stenting in 2002,  mitral valve repair in 2002 secondary to mitral regurgitation,  PAF s/p prior TEE/DCCV on Eliquis,  atypical atrial flutter s/p ablation on 07/27/2013  atrial fibrillation and typical atrial flutter ablation on [facility]/11/2017[facility] s/p MDT PPM,   COPD,  HTN,   HLD  [facility] on 04/01/15 with increased SOB, PNA, elevated troponin.  Quit smoking in 2001 carcinoma of right lung, has completed chemotherapy  falls, and chronic back pain from collapsed vertebrae Suffered pelvic fracture after fall in September 2017 She presents today to the  clinic for follow-up of her atrial fibrillation and CAD  chronic shortness of breath ,  stable On chronic oxygen   followed by Dr. Mortimer Fries for pulmonary issues  Leg weakness chronic issue  Problem on Lipitor, muscles feel better without it  EP reviewed from February 2019 at [facility] documenting minimal atrial fibrillation Compliant with her eliquis  Main issue on today's visit is worsening shortness of breath, chest congestion 1-2 weeks SOB, Feels that her heart is "out of rhythm", likely atrial fibrillation Heart rate 120 bpm, for at least one week This Am in the 59s Taking lasix 20 daily Shortness of breath may be a little bit better but still poor  No bronchitis Having some allergies  Vitals checked with her today BP 118/58 HR 60   Prior CV studies:   The following studies were reviewed today:  Echocardiogram showing EF 30 to 35%, moderately elevated RVSP  CT scan September 2016 showing improvement of her lung cancer She finished her cancer treatment over the summer 2016, she had chemotherapy and radiation  TEE/DCCV in 2013 for her PAF. In 2014 she was diagnosed with atypical atrial flutter and underwent ablation. underwent PPM generator change in 2013.   outpatient cardiac cath on 04/28/2015 that showed 3 vessel CAD with patent SVG to LAD, occluded SVG to OM, native RCA had severe ISR in the mid-segment and was occluded distally at the sites of previously placed stents. There were left to right collaterals. Moderately reduced LVSF with EF of 35-40%. Mildly  elevated LVEDP. There were no good targets for revascularization. medical therapy.    Past Medical History:  Diagnosis Date  . Acute respiratory failure with hypoxia ([place]) 12/11/2017  . Atypical atrial flutter ([place])    a. s/p ablation 07/27/2013 followed by Dr. Rockey Situ  . Balance problem   . CAD (coronary artery disease)    a. s/p MI x 2 in 2002 s/p PCI x 2 in 2002; b. s/p 2v CABG 2002; c. stress echo 07/2004  w/ evi of pos & inf infarct & no evi of ischemia; d. 4/08 dipyridamole scan w/ multiple areas of infarct, no ischemia, EF 49%; e. cath 04/28/15 3v CAD, med Rx rec, no targets for revasc, LM lum irregs, pLAD 30%, 100%, ost-pLCx 60%, [name] 99%, OM2 100%, p-mRCA 90%, [name] 100% L-R collats, VG-mLAD irregs, VG-OM2 oc  . Carcinoma of right lung ([place]) 01/03/2015   a. followed by Dr. Oliva Bustard  . Chronic systolic CHF (congestive heart failure) ([place])    a. echo 03/2015: EF 30-35%, sev ant/inf/pos HK, in mild to mod MR  . Complication of anesthesia    more recently patient oxygen levels do not rebound as quickly  . Compressed spine fracture ([place]) 08/06/2016   lumbar 2, t11, t12  . COPD (chronic obstructive pulmonary disease) ([place])   . Fractured pelvis ([place]) 05/26/2016   2 places  . GERD (gastroesophageal reflux disease)   . History of blood clots    12/2001 left leg  . History of colonoscopy 2013  . History of mammography, screening 2015  . History of Papanicolaou smear of cervix 2013  . HLD (hyperlipidemia)   . HTN (hypertension)   . Hypothyroidism   . Lung cancer ([place])   . Mitral regurgitation    a. s/p mitral ring placement 09/2000; b. echo 09/2010: EF 50%, inf HK, post AK, mild MR, prosthetic mitral valve ring w/ peak gradient of 10 mmHg; b. echo 2/13: EF 50%, mild MR/TR     . Myocardial infarction ([place])    X 2 (LAST ONE IN 2002)  . Neuropathy   . Pacemaker    a. MDT 2002; b. generator replacement 2013; c. followed by Dr. Omelia Blackwater, MD  . PAF (paroxysmal atrial fibrillation) ([place])    a. on Eliquis   . Personal history of chemotherapy   . Personal history of radiation therapy   . Pneumonia    Past Surgical History:  Procedure Laterality Date  . ABLATION  04/2016   [facility]  . APPENDECTOMY    . CARDIAC CATHETERIZATION N/A 04/28/2015   Procedure: Left Heart Cath and Coronary Angiography;  Surgeon: Wellington Hampshire, MD;  Location: [facility];  Service: Cardiovascular;  Laterality: N/A;  .  CARDIOVERSION N/A 04/08/2017   Procedure: CARDIOVERSION;  Surgeon: Wellington Hampshire, MD;  Location: [facility];  Service: Cardiovascular;  Laterality: N/A;  . CHEST TUBE INSERTION N/A 07/11/2018   Procedure: PLEURX CATH INSERTION;  Surgeon: [name], MD;  Location: [facility]S;  Service: Thoracic;  Laterality: N/A;  . COLONOSCOPY  12/2009   2 small tubular adenomas  . CORONARY ARTERY BYPASS GRAFT  09/2000  . DRAIN REMOVAL Right 08/10/2018   Procedure: DRAIN REMOVAL;  Surgeon: [name], MD;  Location: [facility];  Service: General;  Laterality: Right;  . ECTOPIC PREGNANCY SURGERY    . ELECTROMAGNETIC NAVIGATION BROCHOSCOPY N/A 10/06/2015   Procedure: ELECTROMAGNETIC NAVIGATION BRONCHOSCOPY;  Surgeon: Flora Lipps, MD;  Location: [facility];  Service: Cardiopulmonary;  Laterality: N/A;  . ENDOBRONCHIAL ULTRASOUND  N/A 10/06/2015   Procedure: ENDOBRONCHIAL ULTRASOUND;  Surgeon: Flora Lipps, MD;  Location: [facility];  Service: Cardiopulmonary;  Laterality: N/A;  . FLEXIBLE BRONCHOSCOPY Right 07/11/2018   Procedure: FLEXIBLE BRONCHOSCOPY;  Surgeon: [name], MD;  Location: [facility];  Service: Thoracic;  Laterality: Right;  . KYPHOPLASTY N/A 09/28/2016   Procedure: KYPHOPLASTY;  Surgeon: Hessie Knows, MD;  Location: [facility];  Service: Orthopedics;  Laterality: N/A;  . KYPHOPLASTY N/A 02/20/2018   Procedure: [id number];  Surgeon: Hessie Knows, MD;  Location: [facility];  Service: Orthopedics;  Laterality: N/A;  . PACEMAKER INSERTION  03/2012  . thorocentesis  12/24/2016  . VAGINAL HYSTERECTOMY     partial - left ovary remains  . VIDEO ASSISTED THORACOSCOPY Right 07/11/2018   Procedure: VIDEO ASSISTED THORACOSCOPY;  Surgeon: [name], MD;  Location: [facility];  Service: Thoracic;  Laterality: Right;  Marland Kitchen VIDEO ASSISTED THORACOSCOPY (VATS) W/TALC PLEUADESIS Right 07/11/2018   Procedure: THORACOTOMY PLEURAL BIOPSY WITH TALC PLEURODESIS;  Surgeon: [name], MD;  Location: [facility] ORS;  Service:  Thoracic;  Laterality: Right;  pleural biopsy     Current Meds  Medication Sig  . acetaminophen (TYLENOL) 500 MG tablet Take 1,000 mg by mouth every 6 (six) hours as needed for mild pain or moderate pain.   Marland Kitchen amiodarone (PACERONE) 200 MG tablet Take 1 tablet (200 mg total) by mouth daily.  Marland Kitchen apixaban (ELIQUIS) 5 MG TABS tablet Take 1 tablet (5 mg total) by mouth 2 (two) times daily.  . budesonide (PULMICORT) 0.5 MG/2ML nebulizer solution Take 2 mLs (0.5 mg total) by nebulization 2 (two) times daily.  . Calcium Carb-Cholecalciferol (CALCIUM 600-D PO) Take 1 tablet by mouth daily.   . cetirizine (ZYRTEC) 10 MG tablet TAKE (1) TABLET BY MOUTH EVERY DAY (Patient taking differently: Take 10 mg by mouth daily. )  . ENTRESTO 24-26 MG TAKE (1) TABLET BY MOUTH TWICE DAILY  . furosemide (LASIX) 20 MG tablet Take 1 tablet (20 mg total) by mouth daily.  Marland Kitchen levothyroxine (SYNTHROID, LEVOTHROID) 88 MCG tablet TAKE (1) TABLET BY MOUTH EVERY DAY BEFORE BREAKFAST  . metoprolol succinate (TOPROL-XL) 25 MG 24 hr tablet TAKE ONE TABLET BY MOUTH EVERY MORNING AND TAKE TWO TABLETS BY MOUTH EVERY EVENING  . Multiple Vitamins-Minerals (CENTRUM SILVER PO) Take 1 tablet by mouth daily.  . Revefenacin (YUPELRI) 175 MCG/3ML SOLN Inhale 3 mLs into the lungs daily. DX: J76.77  . rosuvastatin (CRESTOR) 5 MG tablet TAKE ONE (1) TABLET BY MOUTH ONCE DAILY  . senna (SENOKOT) 8.6 MG TABS tablet Take 1 tablet (8.6 mg total) by mouth daily.     Allergies:   Lovenox [enoxaparin sodium]   Social History   Tobacco Use  . Smoking status: Former Smoker    Packs/day: 1.00    Years: 40.00    Pack years: 40.00    Types: Cigarettes    Last attempt to quit: 08/30/2000    Years since quitting: 18.2  . Smokeless tobacco: Never Used  . Tobacco comment: quit smoking in 08/28/2000. Smoking cessation materials not required  Substance Use Topics  . Alcohol use: Not Currently    Alcohol/week: 10.0 standard drinks    Types: 10 Glasses  of wine per week    Comment: 2 glasses of wine per day  . Drug use: No     Current Outpatient Medications on File Prior to Visit  Medication Sig Dispense Refill  . acetaminophen (TYLENOL) 500 MG tablet Take 1,000 mg by mouth every 6 (six) hours  as needed for mild pain or moderate pain.     Marland Kitchen amiodarone (PACERONE) 200 MG tablet Take 1 tablet (200 mg total) by mouth daily. 90 tablet 3  . apixaban (ELIQUIS) 5 MG TABS tablet Take 1 tablet (5 mg total) by mouth 2 (two) times daily. 180 tablet 6  . budesonide (PULMICORT) 0.5 MG/2ML nebulizer solution Take 2 mLs (0.5 mg total) by nebulization 2 (two) times daily. 360 mL 5  . Calcium Carb-Cholecalciferol (CALCIUM 600-D PO) Take 1 tablet by mouth daily.     . cetirizine (ZYRTEC) 10 MG tablet TAKE (1) TABLET BY MOUTH EVERY DAY (Patient taking differently: Take 10 mg by mouth daily. ) 30 tablet 12  . ENTRESTO 24-26 MG TAKE (1) TABLET BY MOUTH TWICE DAILY 60 tablet 6  . furosemide (LASIX) 20 MG tablet Take 1 tablet (20 mg total) by mouth daily. 30 tablet 11  . levothyroxine (SYNTHROID, LEVOTHROID) 88 MCG tablet TAKE (1) TABLET BY MOUTH EVERY DAY BEFORE BREAKFAST 30 tablet 5  . metoprolol succinate (TOPROL-XL) 25 MG 24 hr tablet TAKE ONE TABLET BY MOUTH EVERY MORNING AND TAKE TWO TABLETS BY MOUTH EVERY EVENING 90 tablet 0  . Multiple Vitamins-Minerals (CENTRUM SILVER PO) Take 1 tablet by mouth daily.    . Revefenacin (YUPELRI) 175 MCG/3ML SOLN Inhale 3 mLs into the lungs daily. DX: J76.77 90 mL 11  . rosuvastatin (CRESTOR) 5 MG tablet TAKE ONE (1) TABLET BY MOUTH ONCE DAILY 90 tablet 0  . senna (SENOKOT) 8.6 MG TABS tablet Take 1 tablet (8.6 mg total) by mouth daily. 30 each 0   Current Facility-Administered Medications on File Prior to Visit  Medication Dose Route Frequency Provider Last Rate Last Dose  . sodium chloride 0.9 % injection 10 mL  10 mL Intravenous PRN Forest Gleason, MD   10 mL at 02/19/15 1000  . sodium chloride flush (NS) 0.9 % injection  10 mL  10 mL Intravenous PRN Cammie Sickle, MD   10 mL at 02/16/16 1048     Family Hx: The patient's family history includes COPD in her brother, mother, and sister; Colon cancer in her brother; Hypertension in her brother and sister; Lung disease in her father; Stroke in her maternal grandmother. There is no history of Heart attack or Breast cancer.  ROS:   Please see the history of present illness.    Review of Systems  Constitutional: Negative.   Respiratory: Positive for cough and shortness of breath.   Cardiovascular: Negative.   Gastrointestinal: Negative.   Musculoskeletal: Negative.   Neurological: Negative.   Psychiatric/Behavioral: Negative.   All other systems reviewed and are negative.     Labs/Other Tests and Data Reviewed:    Recent Labs: 08/21/2018: B Natriuretic Peptide 778.0 10/17/2018: ALT 14; BUN 12; Creatinine, Ser 1.03; Hemoglobin 11.5; Platelets 249; Potassium 3.9; Sodium 140; TSH 5.657   Recent Lipid Panel Lab Results  Component Value Date/Time   CHOL 131 08/25/2017 09:37 AM   TRIG 159 (H) 08/25/2017 09:37 AM   HDL 36 (L) 08/25/2017 09:37 AM   CHOLHDL 3.6 08/25/2017 09:37 AM   LDLCALC 63 08/25/2017 09:37 AM    Wt Readings from Last 3 Encounters:  12/01/18 147 lb 4 oz (66.8 kg)  10/17/18 146 lb 15 oz (66.7 kg)  09/04/18 149 lb 12 oz (67.9 kg)     Exam:    Vital Signs: Vital signs as detailed above in HPI  Well nourished, well developed female in mild respiratory distress Constitutional:  oriented to person, place, and time.  Head: Normocephalic and atraumatic.  Eyes:  no discharge. No scleral icterus.  Neck: Normal range of motion. Neck supple.  Pulmonary/Chest: Bronchial sounding with cough Musculoskeletal: Normal range of motion.  no  tenderness or deformity.  Neurological:   Coordination normal. Full exam not performed Skin:  No rash Psychiatric:  normal mood and affect. behavior is normal. Thought content normal.    ASSESSMENT  & PLAN:     Chronic combined systolic and diastolic CHF (congestive heart failure) (HCC) Suspect she is in acute on combined heart failure from atrial fibrillation Heart rate low 60 this morning likely back in normal sinus rhythm I suspect Recommended she increase Lasix up to 40 twice daily for 3 days then back down to 20 daily and call us in 3 days time and let us know how she feels  Typical atrial flutter (HCC) Paroxysmal rhythm with 120 most of the week or 2 Now back to normal rate, suspect she may have converted back to normal sinus rhythm  PAF (paroxysmal atrial fibrillation) ([place]) Plan as above, paroxysmal arrhythmia  Pulmonary hypertension, unspecified ([place]) Suspect pressures running high after arrhythmia in the past week or 2 She will increase her Lasix as above  Essential hypertension Blood pressure is well controlled on today's visit. No changes made to the medications.  Chronic respiratory failure with hypoxia (HCC) Worsening shortness of breath in the setting of cardiac arrhythmia the past week or 2.  We will watch her closely and discuss her symptoms in 3 days with her   COVID-19 Education: The signs and symptoms of COVID-19 were discussed with the patient and how to seek care for testing (follow up with PCP or arrange E-visit).  The importance of social distancing was discussed today.  Patient Risk:   After full review of this patients clinical status, I feel that they are at least moderate risk at this time.  Time:   Today, I have spent 25 minutes with the patient with telehealth technology discussing CHF management, arrhythmia management, titration of her diuretics and extra medications for atrial fibrillation/flutter.     Medication Adjustments/Labs and Tests Ordered: Current medicines are reviewed at length with the patient today.  Concerns regarding medicines are outlined above.   Tests Ordered: No tests ordered   Medication Changes: Medication changes  as detailed above   Disposition: Follow-up in 1 months   Signed, Ida Rogue, MD  12/01/2018 10:56 AM    [place] [facility] [address] [place] [address], [place], [place] [address]

## 2018-12-01 NOTE — Patient Instructions (Addendum)
Medication Instructions:   Please take extra metoprolol succinate as needed for atrial fibrillation (total of 4 a day PRN)  For the next three days, ok to take lasix 40 mg twice a day as needed for SOB  Please call if SOB does not start to get better by Monday  Once SOB better, go back to lasix 20 daily  If you need a refill on your cardiac medications before your next appointment, please call your pharmacy.    Lab work: No new labs needed   If you have labs (blood work) drawn today and your tests are completely normal, you will receive your results only by: Marland Kitchen MyChart Message (if you have MyChart) OR . A paper copy in the mail If you have any lab test that is abnormal or we need to change your treatment, we will call you to review the results.   Testing/Procedures: No new testing needed   Follow-Up: At [facility], you and your health needs are our priority.  As part of our continuing mission to provide you with exceptional heart care, we have created designated Provider Care Teams.  These Care Teams include your primary Cardiologist (physician) and Advanced Practice Providers (APPs -  Physician Assistants and Nurse Practitioners) who all work together to provide you with the care you need, when you need it.  . You will need a follow up appointment in 1 month  VIDEO visit.   Marland Kitchen Providers on your designated Care Team:   . Murray Hodgkins, NP . Christell Faith, PA-C . Marrianne Mood, PA-C  Any Other Special Instructions Will Be Listed Below (If Applicable).  For educational health videos Log in to : [email or web address] Or : [email or web address], password : [email or web address]

## 2018-12-05 ENCOUNTER — Telehealth: Payer: Self-pay | Admitting: Cardiovascular Disease

## 2018-12-05 NOTE — Telephone Encounter (Signed)
Spoke with patient and reviewed provider recommendations to increase amiodarone 200 mg to twice a day for one week and then go back to once daily. Also advised that she should stay on the Furosemide 20 mg once daily and hold if her systolic blood pressures is less than 110. Reviewed her follow up appointment information and discovered that her appointment was moved out further so I rescheduled closer and confirmed this was a video call with the provider. Advised that if she should run out of the amiodarone to please give [place] a call back and we will be happy to send it in differently. She was appreciative for the call back with no further questions at this time.

## 2018-12-05 NOTE — Telephone Encounter (Signed)
I think we should increase amiodarone up to 200 twice daily for 1 week to suppress her atrial fibrillation If blood pressure is low after Lasix higher dose Go back to Lasix 20 mg daily she was taking previously (as detailed in the note) Would hold Lasix for systolic pressure less than 110 Blood pressure could be sign of dehydration

## 2018-12-05 NOTE — Telephone Encounter (Signed)
Call to patient to f/u call regarding med change.   She reports that cough and SOB is much improved but she feels that a fib returned yesterday. Rate 50-90s ("about every 3rd beat"). BP 80/42. 92/46.  She took BP back to back while on the phone with me. Denies dizziness with standing.  She does not have BP readings from yesterday prior to converting to a fib. She took all morning medications around 9 am.  She reports urine output is adequate.    Information routed to Dr. Rockey Situ to further advise.

## 2018-12-05 NOTE — Telephone Encounter (Signed)
Patient states she was told to fu with update after med change.    She has lost 5 lbs since Evisit coughing has subsided but HR is still sporadic sometimes tachy but mostly 55-99 range

## 2018-12-11 NOTE — Telephone Encounter (Signed)
Attempted to call x 2 and there was no answer and no voicemail.

## 2018-12-11 NOTE — Telephone Encounter (Signed)
BP LOG   BP  HR o2%   4/9 88/56  112  100/62    81/50  110  97/61  107 92%   4/10 97/51  60  87/50  84  92/49  60  99/50  65 98%    4/11 123/58  61  99/45  64   88/44  62   99/42  65 98%    4/12 138/71  62  106/54  60  86/41  63  99/42  60  98/46  61 94%  4/13 124/58  72  120/60  60 96%

## 2018-12-12 NOTE — Telephone Encounter (Signed)
Spoke with patient. States up 5-6 pounds from dry weight of 142 lb to 147 lb over 1 week. Swelling in feet/legs and abdomen. Denies chest pain. Has dizziness that last a couple seconds occasionally such as when BP was low. Blood pressure is running 80-90's/40-60's towards the afternoon (see readings earlier in encounter entry). Related to COPD, she has productive cough, wheezing and some shortness of breath.  Advised her to take an extra lasix 20 mg today, get up slowing to prevent dizziness.  Please see previous entry for blood pressure readings.  Routing to Dr Rockey Situ for further advice.

## 2018-12-15 ENCOUNTER — Ambulatory Visit: Payer: Medicare Other | Admitting: Physician Assistant

## 2018-12-18 ENCOUNTER — Ambulatory Visit: Payer: Medicare Other | Admitting: Cardiovascular Disease

## 2018-12-25 NOTE — Progress Notes (Signed)
Virtual Visit via Telephone Note   This visit type was conducted due to national recommendations for restrictions regarding the COVID-19 Pandemic (e.g. social distancing) in an effort to limit this patient's exposure and mitigate transmission in our community.  Due to her co-morbid illnesses, this patient is at least at moderate risk for complications without adequate follow up.  This format is felt to be most appropriate for this patient at this time.  The patient did not have access to video technology/had technical difficulties with video requiring transitioning to audio format only (telephone).  All issues noted in this document were discussed and addressed.  No physical exam could be performed with this format.  Please refer to the patient's chart for her  consent to telehealth for [facility].   I connected with  Jenny Cooper on 12/25/18 by a video enabled telemedicine application and verified that I am speaking with the correct person using two identifiers. I discussed the limitations of evaluation and management by telemedicine. The patient expressed understanding and agreed to proceed.   Evaluation Performed:  Follow-up visit  Date:  12/25/2018   ID:  Jenny Cooper 12-Apr-1936, MRN [id number]  Patient Location:  [place] [address]   Provider location:   [facility], [place] office  PCP:  Glean Hess, MD  Cardiologist:  Patsy Baltimore   Chief Complaint:  Atrial fib spell   History of Present Illness:    Jenny Cooper is a 83 y.o. female who presents via audio/video conferencing for a telehealth visit today.   The patient does not symptoms concerning for COVID-19 infection (fever, chills, cough, or new SHORTNESS OF BREATH).   Patient has a past medical history of CAD  s/p remote history of MI in 2002  2 vessel CABG post stenting in 2002,  mitral valve repair in 2002 secondary to mitral regurgitation,  PAF s/p  prior TEE/DCCV on Eliquis,  atypical atrial flutter s/p ablation on 07/27/2013  atrial fibrillation and typical atrial flutter ablation on 9/11/2017at [facility] s/p MDT PPM,   COPD,  HTN,   HLD  [facility] on 04/01/15 with increased SOB, PNA, elevated troponin.  Quit smoking in 2001 carcinoma of right lung, has completed chemotherapy  falls, and chronic back pain from collapsed vertebrae Suffered pelvic fracture after fall in September 2017 She presents today to the clinic for follow-up of her atrial fibrillation and CAD  Recent episode of atrial fibrillation 4/25: early AM still in bed "heart went out of sync" Had low BP, pulse 101 Two days later went back to normal rhythm Breathing ok Denies significant shortness of breath when she was in atrial fibrillation No previous episodes she did get some swelling in her feet  chronic shortness of breath though feels it is stable On chronic oxygen followed by Dr. Mortimer Fries for pulmonary issues  Leg weakness chronic issue Tries to stay active  Problem on Lipitor, muscles feel better without it  She decreased the lasix down to 40 mg once a day Was BID on her last office visit Denies excessive foot swelling or worsening shortness of breath taking Lasix once a day   Other past medical history reviewed EP reviewed from February 2019 at [facility] documenting minimal atrial fibrillation Unable to review pacer download from May 2019 Compliant with her eliquis  On a prior clinic office she was in atrial flutter with rapid rate Started on amiodarone after discussion with [facility] EP  She had worsening shortness of breath, presented to the emergency room Admitted to the hospital with Cardioversion, 04/08/17 Lasix IV for management of her CHF  Echocardiogram showing EF 30 to 35%, moderately elevated RVSP  Cardioversion at the end of May 2018 for atrial flutter Since that time she has felt relatively well, recent vacation in [place] the past week  Reports that she did not feel right at times but unable to put her finger on close going on  CT scan September 2016 showing improvement of her lung cancer She finished her cancer treatment over the summer 2016, she had chemotherapy and radiation  TEE/DCCV in 2013 for her PAF. In 2014 she was diagnosed with atypical atrial flutter and underwent ablation. underwent PPM generator change in 2013.   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  finished concurrent chemoradiation with carboplatinum and Taxol and PET scan showed significant response.   present to [facility] on 8/2. She complained of increase SOB, cough that was productive of green to yellow sputum, nausea, and vomiting.  required BiPAP for a short time upon her arrival. CXR showed bibasilar airspace disease right greater than left has progressed significantly from the prior study, worrisome for recurrent carcinoma however pneumonia could also have this appearance. CT chest has been ordered.   Troponin was found to be 0.48-->1.83.  Echo showed EF 30-35%, severe anterior and infero/posterior wall HK. Left ventricular function parameters were normal, mild to moderate MR. Left atrium was mildly dilated. RV systolic function was normal. Mild to moderate TR. PASP was moderately to severely elevated at 60 mm Hg.   outpatient cardiac cath on 04/28/2015 that showed 3 vessel CAD with patent SVG to LAD, occluded SVG to OM, native RCA had severe ISR in the mid-segment and was occluded distally at the sites of previously placed stents. There were left to right collaterals. Moderately reduced LVSF with EF of 35-40%. Mildly elevated LVEDP. There were no good targets for revascularization. medical therapy.   cardiac event monitor to evaluate her Afib burden that showed 50% Afib burden with a peak heart rate of 116, mostly rate controlled. Given this finding her Toprol was titrated up on 9/6 to 50 mg bid.     Prior CV studies:   The following studies were reviewed today:    Past Medical History:  Diagnosis Date  . Acute respiratory failure with hypoxia ([place]) 12/11/2017  . Atypical atrial flutter ([place])    a. s/p ablation 07/27/2013 followed by Dr. Rockey Situ  . Balance problem   . CAD (coronary artery disease)    a. s/p MI x 2 in 2002 s/p PCI x 2 in 2002; b. s/p 2v CABG 2002; c. stress echo 07/2004 w/ evi of pos & inf infarct & no evi of ischemia; d. 4/08 dipyridamole scan w/ multiple areas of infarct, no ischemia, EF 49%; e. cath 04/28/15 3v CAD, med Rx rec, no targets for revasc, LM lum irregs, pLAD 30%, 100%, ost-pLCx 60%, [name] 99%, OM2 100%, p-mRCA 90%, [name] 100% L-R collats, VG-mLAD irregs, VG-OM2 oc  . Carcinoma of right lung ([place]) 01/03/2015   a. followed by Dr. Oliva Bustard  . Chronic systolic CHF (congestive heart failure) ([place])    a. echo 03/2015: EF 30-35%, sev ant/inf/pos HK, in mild to mod MR  . Complication of anesthesia    more recently patient oxygen levels do not rebound as quickly  . Compressed spine fracture ([place]) 08/06/2016   lumbar 2, t11, t12  .  COPD (chronic obstructive pulmonary disease) ([place])   . Fractured pelvis ([place]) 05/26/2016   2 places  . GERD (gastroesophageal reflux disease)   . History of blood clots    12/2001 left leg  . History of colonoscopy 2013  . History of mammography, screening 2015  . History of Papanicolaou smear of cervix 2013  . HLD (hyperlipidemia)   . HTN (hypertension)   . Hypothyroidism   . Lung cancer ([place])   . Mitral regurgitation    a. s/p mitral ring placement 09/2000; b. echo 09/2010: EF 50%, inf HK, post AK, mild MR, prosthetic mitral valve ring w/ peak gradient of 10 mmHg; b. echo 2/13: EF 50%, mild MR/TR     . Myocardial infarction ([place])    X 2 (LAST ONE IN 2002)  . Neuropathy   . Pacemaker    a. MDT 2002; b. generator replacement 2013; c. followed by Dr. Omelia Blackwater, MD  . PAF (paroxysmal atrial fibrillation) ([place])    a. on Eliquis   .  Personal history of chemotherapy   . Personal history of radiation therapy   . Pneumonia    Past Surgical History:  Procedure Laterality Date  . ABLATION  04/2016   [facility]  . APPENDECTOMY    . CARDIAC CATHETERIZATION N/A 04/28/2015   Procedure: Left Heart Cath and Coronary Angiography;  Surgeon: Wellington Hampshire, MD;  Location: [facility];  Service: Cardiovascular;  Laterality: N/A;  . CARDIOVERSION N/A 04/08/2017   Procedure: CARDIOVERSION;  Surgeon: Wellington Hampshire, MD;  Location: [facility];  Service: Cardiovascular;  Laterality: N/A;  . CHEST TUBE INSERTION N/A 07/11/2018   Procedure: PLEURX CATH INSERTION;  Surgeon: [name], MD;  Location: [facility]S;  Service: Thoracic;  Laterality: N/A;  . COLONOSCOPY  12/2009   2 small tubular adenomas  . CORONARY ARTERY BYPASS GRAFT  09/2000  . DRAIN REMOVAL Right 08/10/2018   Procedure: DRAIN REMOVAL;  Surgeon: [name], MD;  Location: [facility] ORS;  Service: General;  Laterality: Right;  . ECTOPIC PREGNANCY SURGERY    . ELECTROMAGNETIC NAVIGATION BROCHOSCOPY N/A 10/06/2015   Procedure: ELECTROMAGNETIC NAVIGATION BRONCHOSCOPY;  Surgeon: Flora Lipps, MD;  Location: [facility];  Service: Cardiopulmonary;  Laterality: N/A;  . ENDOBRONCHIAL ULTRASOUND N/A 10/06/2015   Procedure: ENDOBRONCHIAL ULTRASOUND;  Surgeon: Flora Lipps, MD;  Location: [facility];  Service: Cardiopulmonary;  Laterality: N/A;  . FLEXIBLE BRONCHOSCOPY Right 07/11/2018   Procedure: FLEXIBLE BRONCHOSCOPY;  Surgeon: [name], MD;  Location: [facility];  Service: Thoracic;  Laterality: Right;  . KYPHOPLASTY N/A 09/28/2016   Procedure: KYPHOPLASTY;  Surgeon: Hessie Knows, MD;  Location: [facility];  Service: Orthopedics;  Laterality: N/A;  . KYPHOPLASTY N/A 02/20/2018   Procedure: [id number];  Surgeon: Hessie Knows, MD;  Location: [facility];  Service: Orthopedics;  Laterality: N/A;  . PACEMAKER INSERTION  03/2012  . thorocentesis  12/24/2016  . VAGINAL HYSTERECTOMY      partial - left ovary remains  . VIDEO ASSISTED THORACOSCOPY Right 07/11/2018   Procedure: VIDEO ASSISTED THORACOSCOPY;  Surgeon: [name], MD;  Location: [facility];  Service: Thoracic;  Laterality: Right;  Marland Kitchen VIDEO ASSISTED THORACOSCOPY (VATS) W/TALC PLEUADESIS Right 07/11/2018   Procedure: THORACOTOMY PLEURAL BIOPSY WITH TALC PLEURODESIS;  Surgeon: [name], MD;  Location: [facility] ORS;  Service: Thoracic;  Laterality: Right;  pleural biopsy     No outpatient medications have been marked as taking for the 12/26/18 encounter (Appointment) with Minna Merritts, MD.     Allergies:  Lovenox [enoxaparin sodium]   Social History   Tobacco Use  . Smoking status: Former Smoker    Packs/day: 1.00    Years: 40.00    Pack years: 40.00    Types: Cigarettes    Last attempt to quit: 08/30/2000    Years since quitting: 18.3  . Smokeless tobacco: Never Used  . Tobacco comment: quit smoking in 08/28/2000. Smoking cessation materials not required  Substance Use Topics  . Alcohol use: Not Currently    Alcohol/week: 10.0 standard drinks    Types: 10 Glasses of wine per week    Comment: 2 glasses of wine per day  . Drug use: No     Current Outpatient Medications on File Prior to Visit  Medication Sig Dispense Refill  . acetaminophen (TYLENOL) 500 MG tablet Take 1,000 mg by mouth every 6 (six) hours as needed for mild pain or moderate pain.     Marland Kitchen amiodarone (PACERONE) 200 MG tablet Take 1 tablet (200 mg total) by mouth daily. 90 tablet 3  . apixaban (ELIQUIS) 5 MG TABS tablet Take 1 tablet (5 mg total) by mouth 2 (two) times daily. 180 tablet 6  . budesonide (PULMICORT) 0.5 MG/2ML nebulizer solution Take 2 mLs (0.5 mg total) by nebulization 2 (two) times daily. 360 mL 5  . Calcium Carb-Cholecalciferol (CALCIUM 600-D PO) Take 1 tablet by mouth daily.     . cetirizine (ZYRTEC) 10 MG tablet TAKE (1) TABLET BY MOUTH EVERY DAY (Patient taking differently: Take 10 mg by mouth daily. ) 30 tablet 12   . ENTRESTO 24-26 MG TAKE (1) TABLET BY MOUTH TWICE DAILY 60 tablet 6  . furosemide (LASIX) 20 MG tablet Take 1 tablet (20 mg total) by mouth daily. 30 tablet 11  . levothyroxine (SYNTHROID, LEVOTHROID) 88 MCG tablet TAKE (1) TABLET BY MOUTH EVERY DAY BEFORE BREAKFAST 30 tablet 5  . metoprolol succinate (TOPROL-XL) 25 MG 24 hr tablet TAKE ONE TABLET BY MOUTH EVERY MORNING AND TAKE TWO TABLETS BY MOUTH EVERY EVENING 90 tablet 0  . Multiple Vitamins-Minerals (CENTRUM SILVER PO) Take 1 tablet by mouth daily.    . Revefenacin (YUPELRI) 175 MCG/3ML SOLN Inhale 3 mLs into the lungs daily. DX: J76.77 90 mL 11  . rosuvastatin (CRESTOR) 5 MG tablet TAKE ONE (1) TABLET BY MOUTH ONCE DAILY 90 tablet 0  . senna (SENOKOT) 8.6 MG TABS tablet Take 1 tablet (8.6 mg total) by mouth daily. 30 each 0   Current Facility-Administered Medications on File Prior to Visit  Medication Dose Route Frequency Provider Last Rate Last Dose  . sodium chloride 0.9 % injection 10 mL  10 mL Intravenous PRN Forest Gleason, MD   10 mL at 02/19/15 1000  . sodium chloride flush (NS) 0.9 % injection 10 mL  10 mL Intravenous PRN Cammie Sickle, MD   10 mL at 02/16/16 1048     Family Hx: The patient's family history includes COPD in her brother, mother, and sister; Colon cancer in her brother; Hypertension in her brother and sister; Lung disease in her father; Stroke in her maternal grandmother. There is no history of Heart attack or Breast cancer.  ROS:   Please see the history of present illness.    Review of Systems  Constitutional: Negative.   Respiratory: Positive for shortness of breath.   Cardiovascular: Positive for palpitations.  Gastrointestinal: Negative.   Musculoskeletal: Negative.   Neurological: Negative.   Psychiatric/Behavioral: Negative.   All other systems reviewed and are negative.  Labs/Other Tests and Data Reviewed:    Recent Labs: 08/21/2018: B Natriuretic Peptide 778.0 10/17/2018: ALT 14;  BUN 12; Creatinine, Ser 1.03; Hemoglobin 11.5; Platelets 249; Potassium 3.9; Sodium 140; TSH 5.657   Recent Lipid Panel Lab Results  Component Value Date/Time   CHOL 131 08/25/2017 09:37 AM   TRIG 159 (H) 08/25/2017 09:37 AM   HDL 36 (L) 08/25/2017 09:37 AM   CHOLHDL 3.6 08/25/2017 09:37 AM   LDLCALC 63 08/25/2017 09:37 AM    Wt Readings from Last 3 Encounters:  12/01/18 147 lb 4 oz (66.8 kg)  10/17/18 146 lb 15 oz (66.7 kg)  09/04/18 149 lb 12 oz (67.9 kg)     Exam:    Vital Signs: Vital signs may also be detailed in the HPI There were no vitals taken for this visit.  Wt Readings from Last 3 Encounters:  12/01/18 147 lb 4 oz (66.8 kg)  10/17/18 146 lb 15 oz (66.7 kg)  09/04/18 149 lb 12 oz (67.9 kg)   Temp Readings from Last 3 Encounters:  10/17/18 97.9 F (36.6 C) (Tympanic)  08/22/18 97.8 F (36.6 C) (Oral)  08/21/18 (!) 97.3 F (36.3 C)   BP Readings from Last 3 Encounters:  12/01/18 (!) 118/58  10/17/18 136/78  09/04/18 (!) 110/58   Pulse Readings from Last 3 Encounters:  12/01/18 61  10/17/18 63  09/04/18 61    120/50, pulse 60 resp 16  Well nourished, well developed female in no acute distress. Constitutional:  oriented to person, place, and time. No distress.     ASSESSMENT & PLAN:    Chronic combined systolic and diastolic CHF (congestive heart failure) (HCC)  Typical atrial flutter (HCC)  PAF (paroxysmal atrial fibrillation) ([place])  Pulmonary hypertension, unspecified ([place])  Essential hypertension  Chronic respiratory failure with hypoxia (HCC)  S/P CABG x 2  Hyperlipidemia LDL goal <70  Chronic obstructive pulmonary disease, unspecified COPD type ([place])   Essential hypertension -  Blood pressure stable, no medication changes made  Paroxysmal atrial fibrillation ([place]) - Previous ablation at [facility] On anticoagulation Recent episode concerning for atrial fibrillation/flutter She continues to take metoprolol with amiodarone  Recommended if she has recurrent episodes of atrial fibrillation that she first take her regularly scheduled medications then wait 2 to 3 hours then take extra amiodarone Maximum amiodarone per day would be 4 pills over 24-hours In effort to restore normal rhythm.  If she has recurrent episodes recommended she call our office.  She is compliant with anticoagulation  Atrial flutter Followed by Dr. Glennon Mac, EP at [facility] she is having paroxysmal fib or flutter Recent episode this past week resolved after 2 days By her account had hypotension during the episode but was relatively asymptomatic Recommend she take extra amiodarone as needed for breakthrough arrhythmia  Hyperlipidemia - Crestor 5 mg daily Numbers at goal  Chronic systolic CHF (congestive heart failure) ([place]) Was previously on Lasix 40 twice daily but reports she is only taking this once a day now Recommend she closely monitor weight, fluid intake, salt intake and extra Lasix for ankle swelling  DM type 2, goal A1c below 7 Unable to exercise, managed with her diet  COPD exacerbation ([place]) Previously treated with prednisone Followed by pulmonary Feels her symptoms are stable,  on chronic oxygen  Chronic back pain Completed physical therapy, previously seen by orthopedics  COVID-19 Education: The signs and symptoms of COVID-19 were discussed with the patient and how to seek care for testing (follow up with  PCP or arrange E-visit).  The importance of social distancing was discussed today.  Patient Risk:   After full review of this patients clinical status, I feel that they are at least moderate risk at this time.  Time:   Today, I have spent 25 minutes with the patient with telehealth technology discussing the cardiac and medical problems/diagnoses detailed above   10 min spent reviewing the chart prior to patient visit today   Medication Adjustments/Labs and Tests Ordered: Current medicines are reviewed at  length with the patient today.  Concerns regarding medicines are outlined above.   Tests Ordered: No tests ordered   Medication Changes: No changes made   Disposition: Follow-up in 6 months   Signed, Ida Rogue, MD  12/25/2018 5:52 PM    [facility] [address] [place] [address], [place], [place] [address]

## 2018-12-26 ENCOUNTER — Telehealth (INDEPENDENT_AMBULATORY_CARE_PROVIDER_SITE_OTHER): Payer: Medicare Other | Admitting: Cardiovascular Disease

## 2018-12-26 ENCOUNTER — Other Ambulatory Visit: Payer: Self-pay

## 2018-12-26 DIAGNOSIS — I480 Paroxysmal atrial fibrillation: Secondary | ICD-10-CM

## 2018-12-26 DIAGNOSIS — J9611 Chronic respiratory failure with hypoxia: Secondary | ICD-10-CM

## 2018-12-26 DIAGNOSIS — J449 Chronic obstructive pulmonary disease, unspecified: Secondary | ICD-10-CM

## 2018-12-26 DIAGNOSIS — I483 Typical atrial flutter: Secondary | ICD-10-CM

## 2018-12-26 DIAGNOSIS — I10 Essential (primary) hypertension: Secondary | ICD-10-CM | POA: Diagnosis not present

## 2018-12-26 DIAGNOSIS — Z951 Presence of aortocoronary bypass graft: Secondary | ICD-10-CM

## 2018-12-26 DIAGNOSIS — I272 Pulmonary hypertension, unspecified: Secondary | ICD-10-CM

## 2018-12-26 DIAGNOSIS — E785 Hyperlipidemia, unspecified: Secondary | ICD-10-CM

## 2018-12-26 DIAGNOSIS — I5042 Chronic combined systolic (congestive) and diastolic (congestive) heart failure: Secondary | ICD-10-CM

## 2018-12-26 MED ORDER — METOPROLOL SUCCINATE ER 25 MG PO TB24: ORAL_TABLET | ORAL | 3 refills | Status: DC

## 2018-12-26 MED ORDER — ROSUVASTATIN CALCIUM 5 MG PO TABS: 5.0000 mg | ORAL_TABLET | Freq: Every day | ORAL | 3 refills | Status: DC

## 2018-12-26 NOTE — Patient Instructions (Addendum)
Medication Instructions:  Refills sent in for your Crestor and Metoprolol.    Recommended if she goes into atrial fibrillation that she take her regularly scheduled medications such as amiodarone and metoprolol in the morning if she does not go back to normal sinus rhythm within 2 hours she take an extra amiodarone 200 mg  If you need a refill on your cardiac medications before your next appointment, please call your pharmacy.    Lab work: No new labs needed   If you have labs (blood work) drawn today and your tests are completely normal, you will receive your results only by: Marland Kitchen MyChart Message (if you have MyChart) OR . A paper copy in the mail If you have any lab test that is abnormal or we need to change your treatment, we will call you to review the results.   Testing/Procedures: No new testing needed   Follow-Up: At [facility], you and your health needs are our priority.  As part of our continuing mission to provide you with exceptional heart care, we have created designated Provider Care Teams.  These Care Teams include your primary Cardiologist (physician) and Advanced Practice Providers (APPs -  Physician Assistants and Nurse Practitioners) who all work together to provide you with the care you need, when you need it.  . You will need a follow up appointment in 6 months .   Please call our office 2 months in advance to schedule this appointment.    . Providers on your designated Care Team:   . Murray Hodgkins, NP . Christell Faith, PA-C . Marrianne Mood, PA-C  Any Other Special Instructions Will Be Listed Below (If Applicable).  For educational health videos Log in to : [email or web address] Or : [email or web address], password : [email or web address]

## 2018-12-27 ENCOUNTER — Encounter: Payer: Self-pay | Admitting: Internal Medicine

## 2018-12-27 ENCOUNTER — Ambulatory Visit (INDEPENDENT_AMBULATORY_CARE_PROVIDER_SITE_OTHER): Payer: Medicare Other | Admitting: Internal Medicine

## 2018-12-27 ENCOUNTER — Ambulatory Visit: Payer: Medicare Other | Admitting: Internal Medicine

## 2018-12-27 DIAGNOSIS — I480 Paroxysmal atrial fibrillation: Secondary | ICD-10-CM

## 2018-12-27 DIAGNOSIS — J449 Chronic obstructive pulmonary disease, unspecified: Secondary | ICD-10-CM | POA: Diagnosis not present

## 2018-12-27 DIAGNOSIS — J9611 Chronic respiratory failure with hypoxia: Secondary | ICD-10-CM

## 2018-12-27 NOTE — Patient Instructions (Signed)
Continue NEB therapy as prescribed Continue Oxygen as prescribed Follow up with Cardiology

## 2018-12-27 NOTE — Progress Notes (Signed)
Date: 12/27/2018  MRN# [id number] Jenny Cooper [birth year]  PMD - Dr. Gayland Curry Jenny Cooper is a 83 y.o. old female seen in follow up for  LLL mass/COPD/Fibrosis       VIDEO/TELEPHONE VISIT    In the setting of the current Covid19 crisis, you are scheduled for a  visit with me on 12/27/2018  Just as we do with many in-office visits, in order for you to participate in this visit, we must obtain consent.   I can obtain your verbal consent now.  PATIENT AGREES AND CONFIRMS -YES This Visit has Audio and Visual Capabilities for optimal patient care experience   Evaluation Performed:  Follow-up visit  This visit type was conducted due to national recommendations for restrictions regarding the COVID-19 Pandemic (e.g. social distancing).  This format is felt to be most appropriate for this patient at this time.  All issues noted in this document were discussed and addressed.     Virtual Visit via Telephone Note   Virtual Visit Via VIDEO  I connected with patient on 12/27/2018  by telephone and verified that I am speaking with the correct person using two identifiers.   I discussed the limitations, risks, security and privacy concerns of performing an evaluation and management service by telephone and the availability of in person appointments. I also discussed with the patient that there may be a patient responsible charge related to this service. The patient expressed understanding and agreed to proceed.   Location of the patient: Home Location of provider: Home Participating persons: Patient and provider only    Synopsis - 83 year old female first evaluated by pulmonary in early 2016 for chronic cough, found to have right hilar mass, biopsy of mass and pathology specimens positive for squamous cell, stage IIIa. Now status post chemoradiation, recently found to have left lower lobe mass, with chronic cough, chronic antibiotics. Biopsy, EBUS, ENB, of left lower lobe mass  negative for malignancy - she has an adverse reaction to prednisone therapy wears her A. fib goes out of control She is currently on amiodarone therapy    chronic cough, along with postradiation fibrosis. 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 Status post bronchoscopy in February 2017-negative for malignancy; May 5th PET scan shows no significant concerns for recurrent malignancy; shows radiation changes; pleural effusion   CC Follow up COPD Follow up SOB   HPI Patient started on NEB therapy at last OV NEB therapy is helping a lot  her respiratory capacity has declined over the last several months and she is not able to take her inhaler therapy at this time but will Continue nebulized therapy in order to get maximum benefit    No obvious signs of infection at this time No obvious signs of acute heart failure at this time  Patient on oxygen therapy and benefits from therapy She needs this for survival  Allergies:  Lovenox [enoxaparin sodium]    Review of Systems:  Gen:  Denies  fever, sweats, chills weigh loss  HEENT: Denies blurred vision, double vision, ear pain, eye pain, hearing loss, nose bleeds, sore throat Cardiac:  No dizziness, chest pain or heaviness, chest tightness,edema, No JVD Resp: +shortness of breath,-wheezing, -hemoptysis,  Gi: Denies swallowing difficulty, stomach pain, nausea or vomiting, diarrhea, constipation, bowel incontinence Gu:  Denies bladder incontinence, burning urine Ext:   Denies Joint pain, stiffness or swelling Skin: Denies  skin rash, easy bruising or bleeding or hives Endoc:  Denies polyuria, polydipsia , polyphagia or weight change Psych:   Denies depression, insomnia or hallucinations  Other:  All other systems negative    Physical Examination:  There were no vitals taken for this visit.       ECHO 03/2014 MILD LV DYSFUNCTION   NORMAL RIGHT VENTRICULAR SYSTOLIC FUNCTION VALVULAR REGURGITATION: MILD MR, TRIVIAL PR, MILD TR PROSTHETIC VALVE(S): PROSTHETIC MV RING   CT chest 11/2017 Right hilar and suprahilar scarring associated with scarring in the medial right lung, likely treatment related from prior radiation therapy.    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 with allergic rhinitis with underlying afib with CHF with chronic hypoxic respiratory failure  Pulmicort nebulizers as well as YUPELRI which is long-acting muscarinic bronchodilator neb therapy has helped tremendously and will NOT change regimen at this time    COPD moderate to severe Gold stage C Continue NEB therapy as prescribed Doing well, no changes at this time   Chronic hypoxic respiratory failure Patient needs continuous oxygen therapy 2L [place] with exertion and at night She uses and benefits from oxygentherapy and needs this to survive   CHF afib/ AFIB continue anticoagulation Follow up cardiology-Dr Rockey Situ Continue amiodarone therapy as prescribed   Lung Cancer Follow up Oncology as scheduled   TOTAL TIME SPENT 23 minutes  COVID-19 Education: The signs and symptoms of COVID-19 were discussed with the patient and how to seek care for testing (follow up with PCP or arrange E-visit).  The importance of social distancing was discussed today.   Patient satisfied with Plan of action and management. All questions answered  Follow up in 6 months  Jenny Cooper, M.D.  [facility]  Medical Director [place] Director [facility] Cardio-Pulmonary Department

## 2019-01-09 ENCOUNTER — Telehealth: Payer: Medicare Other | Admitting: Cardiovascular Disease

## 2019-01-14 DIAGNOSIS — E538 Deficiency of other specified B group vitamins: Secondary | ICD-10-CM | POA: Insufficient documentation

## 2019-01-15 ENCOUNTER — Other Ambulatory Visit: Payer: Self-pay

## 2019-01-15 ENCOUNTER — Inpatient Hospital Stay: Payer: Medicare Other | Attending: Hematology and Oncology

## 2019-01-15 DIAGNOSIS — E538 Deficiency of other specified B group vitamins: Secondary | ICD-10-CM

## 2019-01-15 DIAGNOSIS — C3401 Malignant neoplasm of right main bronchus: Secondary | ICD-10-CM | POA: Insufficient documentation

## 2019-01-15 DIAGNOSIS — D649 Anemia, unspecified: Secondary | ICD-10-CM

## 2019-01-15 LAB — COMPREHENSIVE METABOLIC PANEL
ALT: 15 U/L (ref 0–44)
AST: 29 U/L (ref 15–41)
Albumin: 3.5 g/dL (ref 3.5–5.0)
Alkaline Phosphatase: 96 U/L (ref 38–126)
Anion gap: 7 (ref 5–15)
BUN: 16 mg/dL (ref 8–23)
CO2: 28 mmol/L (ref 22–32)
Calcium: 8.9 mg/dL (ref 8.9–10.3)
Chloride: 101 mmol/L (ref 98–111)
Creatinine, Ser: 1.15 mg/dL — ABNORMAL HIGH (ref 0.44–1.00)
GFR calc Af Amer: 52 mL/min — ABNORMAL LOW (ref >60)
GFR calc non Af Amer: 45 mL/min — ABNORMAL LOW (ref >60)
Glucose, Bld: 128 mg/dL — ABNORMAL HIGH (ref 70–99)
Potassium: 4.1 mmol/L (ref 3.5–5.1)
Sodium: 136 mmol/L (ref 135–145)
Total Bilirubin: 1 mg/dL (ref 0.3–1.2)
Total Protein: 7.7 g/dL (ref 6.5–8.1)

## 2019-01-15 LAB — CBC WITH DIFFERENTIAL/PLATELET
Abs Immature Granulocytes: 0.02 10*3/uL (ref 0.00–0.07)
Basophils Absolute: 0 10*3/uL (ref 0.0–0.1)
Basophils Relative: 0 %
Eosinophils Absolute: 0.2 10*3/uL (ref 0.0–0.5)
Eosinophils Relative: 4 %
HCT: 35.5 % — ABNORMAL LOW (ref 36.0–46.0)
Hemoglobin: 10.7 g/dL — ABNORMAL LOW (ref 12.0–15.0)
Immature Granulocytes: 0 %
Lymphocytes Relative: 12 %
Lymphs Abs: 0.6 10*3/uL — ABNORMAL LOW (ref 0.7–4.0)
MCH: 27.5 pg (ref 26.0–34.0)
MCHC: 30.1 g/dL (ref 30.0–36.0)
MCV: 91.3 fL (ref 80.0–100.0)
Monocytes Absolute: 0.6 10*3/uL (ref 0.1–1.0)
Monocytes Relative: 12 %
Neutro Abs: 3.3 10*3/uL (ref 1.7–7.7)
Neutrophils Relative %: 72 %
Platelets: 190 10*3/uL (ref 150–400)
RBC: 3.89 MIL/uL (ref 3.87–5.11)
RDW: 18 % — ABNORMAL HIGH (ref 11.5–15.5)
WBC: 4.7 10*3/uL (ref 4.0–10.5)
nRBC: 0 % (ref 0.0–0.2)

## 2019-01-16 ENCOUNTER — Other Ambulatory Visit: Payer: Medicare Other

## 2019-01-16 ENCOUNTER — Inpatient Hospital Stay: Payer: Medicare Other | Admitting: Hematology and Oncology

## 2019-01-16 LAB — VITAMIN B12: Vitamin B-12: 769 pg/mL (ref 180–914)

## 2019-01-18 ENCOUNTER — Ambulatory Visit
Admission: RE | Admit: 2019-01-18 | Discharge: 2019-01-18 | Disposition: A | Discharge status: Home / Self Care | Payer: Medicare Other | Attending: Hematology and Oncology | Admitting: Hematology and Oncology

## 2019-01-18 ENCOUNTER — Ambulatory Visit
Admission: RE | Admit: 2019-01-18 | Discharge: 2019-01-18 | Disposition: A | Discharge status: Home / Self Care | Payer: Medicare Other | Source: Ambulatory Visit | Attending: Hematology and Oncology | Admitting: Hematology and Oncology

## 2019-01-18 ENCOUNTER — Other Ambulatory Visit: Payer: Self-pay

## 2019-01-18 DIAGNOSIS — C3401 Malignant neoplasm of right main bronchus: Secondary | ICD-10-CM

## 2019-01-24 ENCOUNTER — Encounter: Payer: Self-pay | Admitting: Hematology and Oncology

## 2019-02-08 ENCOUNTER — Other Ambulatory Visit: Payer: Self-pay | Admitting: Internal Medicine

## 2019-02-08 IMAGING — CR DG CHEST 2V
1 series · 2 of 2 positions shown · non-contrast
Comparison: 07/13/2017

CLINICAL DATA: Right pleural effusion, status post drainage tube

EXAM:
CHEST - 2 VIEW

[Series 1: dg chest 2 view · 0.14mm/px · 2 of 2 slices shown]
[im 1/2]
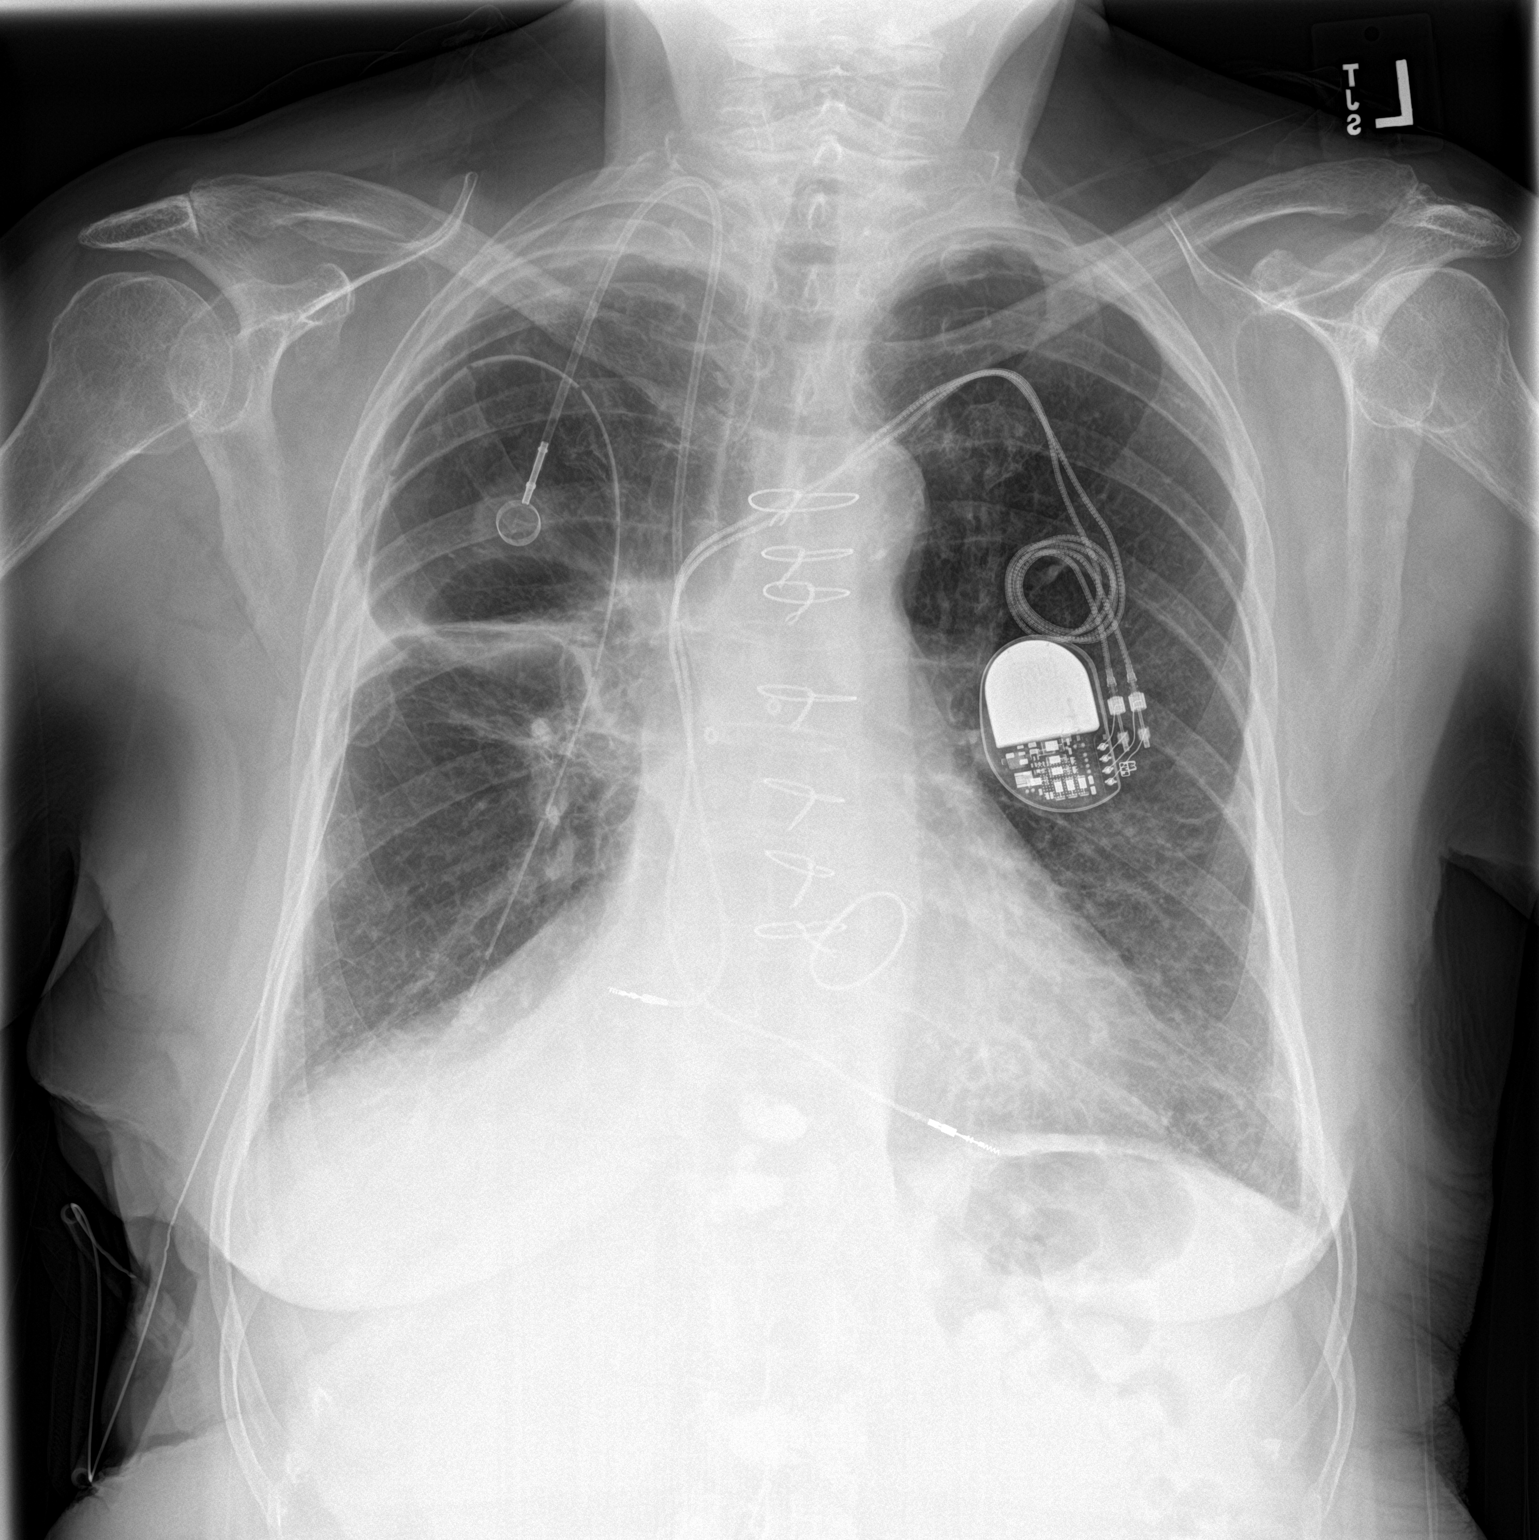
[im 2/2]
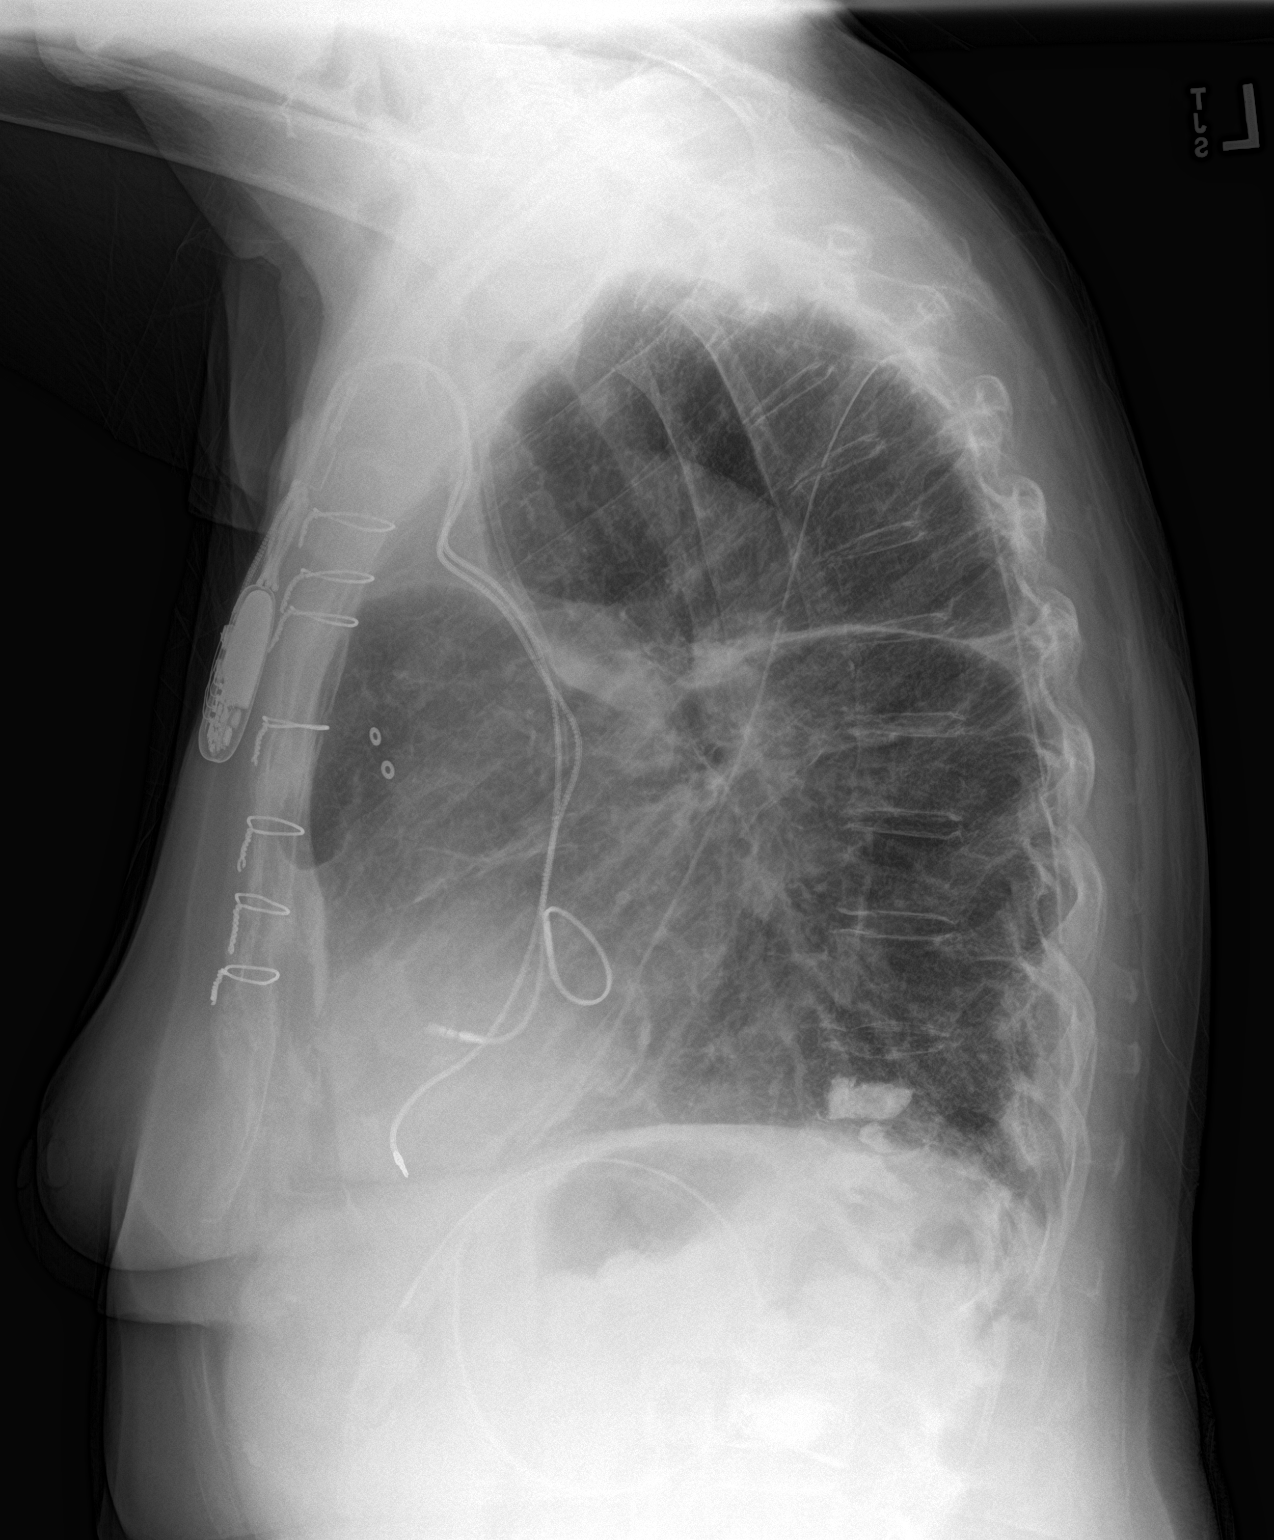

[2 of 2 positions shown; findings below may reference images not displayed]

FINDINGS: Small right pneumothorax with apical capping. Indwelling right chest
tube. Prior trace apical pneumothorax is not well visualized on the
current study.

Platelike scarring in the right mid lung/perihilar region. Left lung
is essentially clear.

The heart is normal in size. Prosthetic valve. Postsurgical changes
related to prior CABG. Left subclavian pacemaker.

Prior lower thoracic vertebral augmentation.
IMPRESSION: Small right pneumothorax. Indwelling right chest tube. Prior trace
apical pneumothorax is not well visualized on the current study.

## 2019-03-09 ENCOUNTER — Encounter: Payer: Self-pay | Admitting: Radiation Oncology

## 2019-03-09 ENCOUNTER — Other Ambulatory Visit: Payer: Self-pay | Admitting: Internal Medicine

## 2019-03-09 DIAGNOSIS — E039 Hypothyroidism, unspecified: Secondary | ICD-10-CM

## 2019-03-19 ENCOUNTER — Telehealth: Payer: Self-pay | Admitting: Internal Medicine

## 2019-03-19 NOTE — Telephone Encounter (Addendum)
Called and spoke to pt. Pt stated that she has been exposed to someone, who is currently awaiting covid test results. SMW and OV has been rescheduled to 04/02/2019 and 04/11/2019. Nothing further [place] needed.

## 2019-03-21 ENCOUNTER — Ambulatory Visit: Payer: Medicare Other | Admitting: Internal Medicine

## 2019-03-21 ENCOUNTER — Ambulatory Visit: Payer: Medicare Other

## 2019-03-29 ENCOUNTER — Ambulatory Visit: Payer: Medicare Other | Admitting: Radiation Oncology

## 2019-04-02 ENCOUNTER — Ambulatory Visit: Payer: Medicare Other

## 2019-04-05 ENCOUNTER — Other Ambulatory Visit: Payer: Self-pay

## 2019-04-05 ENCOUNTER — Encounter: Payer: Medicare Other | Admitting: Internal Medicine

## 2019-04-05 NOTE — Progress Notes (Signed)
 This encounter was created in error - please disregard.

## 2019-04-11 ENCOUNTER — Ambulatory Visit: Payer: Medicare Other | Admitting: Internal Medicine

## 2019-04-16 ENCOUNTER — Ambulatory Visit (INDEPENDENT_AMBULATORY_CARE_PROVIDER_SITE_OTHER): Payer: Medicare Other | Admitting: Internal Medicine

## 2019-04-16 ENCOUNTER — Encounter: Payer: Self-pay | Admitting: Internal Medicine

## 2019-04-16 ENCOUNTER — Other Ambulatory Visit: Payer: Self-pay

## 2019-04-16 DIAGNOSIS — J9611 Chronic respiratory failure with hypoxia: Secondary | ICD-10-CM | POA: Diagnosis not present

## 2019-04-16 DIAGNOSIS — J449 Chronic obstructive pulmonary disease, unspecified: Secondary | ICD-10-CM | POA: Diagnosis not present

## 2019-04-16 NOTE — Patient Instructions (Addendum)
Continue NEB therapy as prescribed  Continue Oxygen as prescribed Patient may takeoff oxygen while at rest as long as oxygen sats 88%

## 2019-04-16 NOTE — Progress Notes (Signed)
Date: 04/16/2019  MRN# [id number] Jenny Cooper 05-25-1936  PMD - Dr. Gayland Curry ALIYA SOL is a 83 y.o. old female seen in follow up for  LLL mass/COPD/Fibrosis    I connected with the patient by telephone enabled telemedicine visit and verified that I am speaking with the correct person using two identifiers.    I discussed the limitations, risks, security and privacy concerns of performing an evaluation and management service by telemedicine and the availability of in-person appointments. I also discussed with the patient that there may be a patient responsible charge related to this service. The patient expressed understanding and agreed to proceed.  PATIENT AGREES AND CONFIRMS -YES   Other persons participating in the visit and their role in the encounter: Patient, nursing    I discussed the limitations, risks, security and privacy concerns of performing an evaluation and management service by telephone and the availability of in person appointments. I also discussed with the patient that there may be a patient responsible charge related to this service. The patient expressed understanding and agreed to proceed.  This visit type was conducted due to national recommendations for restrictions regarding the COVID-19 Pandemic (e.g. social distancing).  This format is felt to be most appropriate for this patient at this time.  All issues noted in this document were discussed and addressed.         Synopsis - 83 year old female first evaluated by pulmonary in early 2016 for chronic cough, found to have right hilar mass, biopsy of mass and pathology specimens positive for squamous cell, stage IIIa. Now status post chemoradiation, recently found to have left lower lobe mass, with chronic cough, chronic antibiotics. Biopsy, EBUS, ENB, of left lower lobe mass negative for malignancy - she has an adverse reaction to prednisone therapy wears her A. fib goes out of control She is  currently on amiodarone therapy    chronic cough, along with postradiation fibrosis. 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 Status post bronchoscopy in February 2017-negative for malignancy; May 5th PET scan shows no significant concerns for recurrent malignancy; shows radiation changes; pleural effusion   CC Follow up COPD Follow up SOB   HPI She tolerates NEB well-helping alot On oxygen-uses 24/7  No symptoms of infection No symptoms of COPD exacerbation   no evidence of CHF exacerbation  Her resp capacity has declined but is tolerating NEB therapy fro maximum benefit She no longer can use traditional inhaler therapy   Allergies:  Lovenox [enoxaparin sodium]    Review of Systems:  Gen:  Denies  fever, sweats, chills weight loss  HEENT: Denies blurred vision, double vision, ear pain, eye pain, hearing loss, nose bleeds, sore throat Cardiac:  No dizziness, chest pain or heaviness, chest tightness,edema, No JVD Resp:   +shortness of breath,-wheezing, -hemoptysis,  Gi: Denies swallowing difficulty, stomach pain, nausea or vomiting, diarrhea, constipation, bowel incontinence Gu:  Denies bladder incontinence, burning urine Ext:   Denies Joint pain, stiffness or swelling Skin: Denies  skin rash, easy bruising or bleeding or hives Endoc:  Denies polyuria, polydipsia , polyphagia or weight change Psych:   Denies depression, insomnia or hallucinations  Other:  All other systems negative  109/57 Oxygen  97% on oxygen at rest   Physical Examination:  There were no vitals taken for this visit.       ECHO 03/2014 MILD LV DYSFUNCTION  NORMAL RIGHT VENTRICULAR SYSTOLIC FUNCTION VALVULAR REGURGITATION: MILD MR, TRIVIAL PR, MILD TR PROSTHETIC VALVE(S): PROSTHETIC MV RING   CT chest 11/2017 Right hilar and suprahilar scarring associated with scarring in the medial right lung,  likely treatment related from prior radiation therapy.    Assessment and Plan:  Moderate COPD Gold Stage B PulmiCORT NEBS YUPELRI LAMA This regimen has helped tremendously  Chronic Hypoxic resp failure She needs continuous oxygen therapy 2L [place] with exertion and at night She uses and benefits from therapy, she needs oxygen to survive Patient may try to wean off oxygen at rest   CHF/Afib Continue oral AC Follow up with Dr Rockey Situ Continue amiodarone therapy  H/o stage 3A non-small cell lung cancer Follow up oncology as scheduled  83 year old female past medical history of chronic cough, moderate Gold stage B COPD, stage IIIa non-small cell lung cancer, status post chemoradiation, chronic SOB and DOE  in the setting of suspected postradiation pneumonitis/fibrosis with allergic rhinitis with underlying afib with CHF with chronic hypoxic respiratory failure   TOTAL TIME SPENT 23 minutes    COVID-19 EDUCATION: The signs and symptoms of COVID-19 were discussed with the patient and how to seek care for testing.  The importance of social distancing was discussed today. Hand Washing Techniques and avoid touching face was advised.  MEDICATION ADJUSTMENTS/LABS AND TESTS ORDERED: Continue NEB therapy as prescribed Continue Oxygen as prescribed   CURRENT MEDICATIONS REVIEWED AT LENGTH WITH PATIENT TODAY   Patient satisfied with Plan of action and management. All questions answered  Follow up in 6 months   Total Time spent 25 mins   Wrigley Winborne Patricia Pesa, M.D.  [facility]  Medical Director [place] Director [facility] Cardio-Pulmonary Department

## 2019-04-19 ENCOUNTER — Other Ambulatory Visit: Payer: Self-pay | Admitting: Internal Medicine

## 2019-04-27 ENCOUNTER — Other Ambulatory Visit: Payer: Self-pay | Admitting: Cardiovascular Disease

## 2019-05-02 NOTE — Telephone Encounter (Signed)
Rhonda please advise. Thanks

## 2019-05-29 ENCOUNTER — Encounter: Payer: Self-pay | Admitting: Hematology and Oncology

## 2019-05-30 ENCOUNTER — Ambulatory Visit (INDEPENDENT_AMBULATORY_CARE_PROVIDER_SITE_OTHER): Payer: Medicare Other

## 2019-05-30 VITALS — BP 113/57 | HR 62 | Temp 97.4°F | Resp 16 | Ht 66.0 in | Wt 150.0 lb

## 2019-05-30 DIAGNOSIS — Z0000 Encounter for general adult medical examination without abnormal findings: Secondary | ICD-10-CM | POA: Diagnosis not present

## 2019-05-30 NOTE — Progress Notes (Signed)
Subjective:   Jenny Cooper is a 83 y.o. female who presents for Medicare Annual (Subsequent) preventive examination.  Virtual Visit via Telephone Note  I connected with Jenny Cooper on 05/30/19 at 10:40 AM EDT by telephone and verified that I am speaking with the correct person using two identifiers.  Medicare Annual Wellness visit completed telephonically due to Covid-19 pandemic.   Location: Patient: home Provider: office   I discussed the limitations, risks, security and privacy concerns of performing an evaluation and management service by telephone and the availability of in person appointments. The patient expressed understanding and agreed to proceed.  Some vital signs may be absent or patient reported.   Clemetine Marker, LPN    Review of Systems:   Cardiac Risk Factors include: advanced age (>52mn, >>16women);dyslipidemia;hypertension     Objective:     Vitals: BP (!) 113/57   Pulse 62   Temp (!) 97.4 F (36.3 C)   Resp 16   Ht _0  (1.676 m)   Wt 150 lb (68 kg)   SpO2 99%   BMI 24.21 kg/m   Body mass index is 24.21 kg/m.  Advanced Directives 05/30/2019 10/17/2018 08/21/2018 08/21/2018 08/15/2018 08/10/2018 08/08/2018  Does Patient Have a Medical Advance Directive? Yes - _1   Type of Advance Directive [place] will - Healthcare Power of [place]uth[place] will [place] will -  Does patient want to make changes to medical advance directive? - - No - Patient declined No - Patient declined No - Patient declined No - Patient declined -  Copy of [place] Chart? No - copy requested - No - copy requested No - copy requested No - copy requested No - copy requested -  Would patient like information on creating a medical advance directive? - No - Patient declined No - Patient declined - No - Patient declined - -     Tobacco Social History   Tobacco Use  Smoking Status Former Smoker  . Packs/day: 1.00  . Years: 40.00  . Pack years: 40.00  . Types: Cigarettes  . Quit date: 08/30/2000  . Years since quitting: 18.7  Smokeless Tobacco Never Used  Tobacco Comment   quit smoking in 08/28/2000. Smoking cessation materials not required     Counseling given: Not Answered Comment: quit smoking in 08/28/2000. Smoking cessation materials not required   Clinical Intake:  Pre-visit preparation completed: Yes  Pain : No/denies pain     BMI - recorded: 24.21 Nutritional Status: BMI of 19-24  Normal Nutritional Risks: None Diabetes: No  How often do you need to have someone help you when you read instructions, pamphlets, or other written materials from your doctor or pharmacy?: 1 - Never  Interpreter Needed?: No  Information entered by :: KClemetine MarkerLPN  Past Medical History:  Diagnosis Date  . Acute respiratory failure with hypoxia ([place] 12/11/2017  . Allergy   . Atypical atrial flutter ([place]    a. s/p ablation 07/27/2013 followed by Dr. GRockey Situ . Balance problem   . CAD (coronary artery disease)    a. s/p MI x 2 in 2002 s/p PCI x 2 in 2002; b. s/p 2v CABG 2002; c. stress echo 07/2004 w/ evi of pos & inf infarct & no evi of ischemia; d. 4/08 dipyridamole scan w/ multiple areas of infarct, no ischemia, EF 49%; e. cath 04/28/15 3v CAD,  med Rx rec, no targets for revasc, LM lum irregs, pLAD 30%, 100%, ost-pLCx 60%, [name] 99%, OM2 100%, p-mRCA 90%, [name] 100% L-R collats, VG-mLAD irregs, VG-OM2 oc  . Carcinoma of right lung ([place]) 01/03/2015   a. followed by Dr. Oliva Bustard  . Chronic systolic CHF (congestive heart failure) ([place])    a. echo 03/2015: EF 30-35%, sev ant/inf/pos HK, in mild to mod MR  . Complication of anesthesia    more recently patient oxygen levels do not rebound as quickly  . Compressed spine fracture ([place]) 08/06/2016   lumbar 2, t11, t12  . COPD (chronic obstructive pulmonary disease)  ([place])   . Fractured pelvis ([place]) 05/26/2016   2 places  . GERD (gastroesophageal reflux disease)   . History of blood clots    12/2001 left leg  . History of colonoscopy 2013  . History of mammography, screening 2015  . History of Papanicolaou smear of cervix 2013  . HLD (hyperlipidemia)   . HTN (hypertension)   . Hypothyroidism   . Lung cancer ([place])   . Mitral regurgitation    a. s/p mitral ring placement 09/2000; b. echo 09/2010: EF 50%, inf HK, post AK, mild MR, prosthetic mitral valve ring w/ peak gradient of 10 mmHg; b. echo 2/13: EF 50%, mild MR/TR     . Myocardial infarction ([place])    X 2 (LAST ONE IN 2002)  . Neuropathy   . Pacemaker    a. MDT 2002; b. generator replacement 2013; c. followed by Dr. Omelia Blackwater, MD  . PAF (paroxysmal atrial fibrillation) ([place])    a. on Eliquis   . Personal history of chemotherapy   . Personal history of radiation therapy   . Pneumonia    Past Surgical History:  Procedure Laterality Date  . ABLATION  04/2016   [facility]  . APPENDECTOMY    . CARDIAC CATHETERIZATION N/A 04/28/2015   Procedure: Left Heart Cath and Coronary Angiography;  Surgeon: Wellington Hampshire, MD;  Location: [place] [facility];  Service: Cardiovascular;  Laterality: N/A;  . CARDIOVERSION N/A 04/08/2017   Procedure: CARDIOVERSION;  Surgeon: Wellington Hampshire, MD;  Location: [facility];  Service: Cardiovascular;  Laterality: N/A;  . CHEST TUBE INSERTION N/A 07/11/2018   Procedure: PLEURX CATH INSERTION;  Surgeon: [name], MD;  Location: [facility];  Service: Thoracic;  Laterality: N/A;  . COLONOSCOPY  12/2009   2 small tubular adenomas  . CORONARY ARTERY BYPASS GRAFT  09/2000  . DRAIN REMOVAL Right 08/10/2018   Procedure: DRAIN REMOVAL;  Surgeon: [name], MD;  Location: [facility];  Service: General;  Laterality: Right;  . ECTOPIC PREGNANCY SURGERY    . ELECTROMAGNETIC NAVIGATION BROCHOSCOPY N/A 10/06/2015   Procedure: ELECTROMAGNETIC NAVIGATION BRONCHOSCOPY;  Surgeon: Flora Lipps, MD;  Location: [facility];  Service: Cardiopulmonary;  Laterality: N/A;  . ENDOBRONCHIAL ULTRASOUND N/A 10/06/2015   Procedure: ENDOBRONCHIAL ULTRASOUND;  Surgeon: Flora Lipps, MD;  Location: [facility];  Service: Cardiopulmonary;  Laterality: N/A;  . FLEXIBLE BRONCHOSCOPY Right 07/11/2018   Procedure: FLEXIBLE BRONCHOSCOPY;  Surgeon: [name], MD;  Location: [facility];  Service: Thoracic;  Laterality: Right;  . KYPHOPLASTY N/A 09/28/2016   Procedure: KYPHOPLASTY;  Surgeon: Hessie Knows, MD;  Location: [facility];  Service: Orthopedics;  Laterality: N/A;  . KYPHOPLASTY N/A 02/20/2018   Procedure: [id number];  Surgeon: Hessie Knows, MD;  Location: [facility];  Service: Orthopedics;  Laterality: N/A;  . PACEMAKER INSERTION  03/2012  . thorocentesis  12/24/2016  . VAGINAL HYSTERECTOMY  partial - left ovary remains  . VIDEO ASSISTED THORACOSCOPY Right 07/11/2018   Procedure: VIDEO ASSISTED THORACOSCOPY;  Surgeon: [name], MD;  Location: [facility];  Service: Thoracic;  Laterality: Right;  Marland Kitchen VIDEO ASSISTED THORACOSCOPY (VATS) W/TALC PLEUADESIS Right 07/11/2018   Procedure: THORACOTOMY PLEURAL BIOPSY WITH TALC PLEURODESIS;  Surgeon: [name], MD;  Location: [facility];  Service: Thoracic;  Laterality: Right;  pleural biopsy   Family History  Problem Relation Age of Onset  . COPD Mother        sister, and brother  . Lung disease Father   . Stroke Maternal Grandmother   . Hypertension Sister   . COPD Sister   . Hypertension Brother   . COPD Brother   . Colon cancer Brother   . Heart attack Neg Hx   . Breast cancer Neg Hx    Social History   Socioeconomic History  . Marital status: Widowed    Spouse name: Not on file  . Number of children: 6  . Years of education: Not on file  . Highest education level: Some college, no degree  Occupational History  . Occupation: Retired  Scientific laboratory technician  . Financial resource strain: Not very hard  . Food insecurity    Worry: Never true     Inability: Never true  . Transportation needs    Medical: No    Non-medical: No  Tobacco Use  . Smoking status: Former Smoker    Packs/day: 1.00    Years: 40.00    Pack years: 40.00    Types: Cigarettes    Quit date: 08/30/2000    Years since quitting: 18.7  . Smokeless tobacco: Never Used  . Tobacco comment: quit smoking in 08/28/2000. Smoking cessation materials not required  Substance and Sexual Activity  . Alcohol use: Not Currently    Alcohol/week: 10.0 standard drinks    Types: 10 Glasses of wine per week    Comment: 2 glasses of wine per day  . Drug use: No  . Sexual activity: Never  Lifestyle  . Physical activity    Days per week: 3 days    Minutes per session: 20 min  . Stress: Not at all  Relationships  . Social connections    Talks on phone: More than three times a week    Gets together: Once a week    Attends religious service: Never    Active member of club or organization: No    Attends meetings of clubs or organizations: Never    Relationship status: Widowed  Other Topics Concern  . Not on file  Social History Narrative  . Not on file    Outpatient Encounter Medications as of 05/30/2019  Medication Sig  . acetaminophen (TYLENOL) 500 MG tablet Take 1,000 mg by mouth every 6 (six) hours as needed for mild pain or moderate pain.   Marland Kitchen amiodarone (PACERONE) 200 MG tablet TAKE 1 TABLET BY MOUTH AS DIRECTED (2 TABLETS TWICE DAILY x 5 DAYS THEN 1 TABLET TWICE A DAY THEREAFTER  . apixaban (ELIQUIS) 5 MG TABS tablet Take 1 tablet (5 mg total) by mouth 2 (two) times daily.  . Calcium Carb-Cholecalciferol (CALCIUM 600-D PO) Take 1 tablet by mouth daily.   . cetirizine (ZYRTEC) 10 MG tablet TAKE (1) TABLET BY MOUTH EVERY DAY (Patient taking differently: Take 10 mg by mouth daily. )  . ENTRESTO 24-26 MG TAKE (1) TABLET BY MOUTH TWICE DAILY  . furosemide (LASIX) 20 MG tablet Take 1 tablet (20  mg total) by mouth daily.  Marland Kitchen ipratropium-albuterol (DUONEB) 0.5-2.5 (3)  MG/3ML SOLN TAKE 3 MLS BY NEBULIZATION EVERY 6 HOURSAS NEEDED FOR WHEEZING OR SHORTNESS OF BREATH  . levothyroxine (SYNTHROID) 88 MCG tablet TAKE (1) TABLET BY MOUTH EVERY DAY BEFORE BREAKFAST.  . metoprolol succinate (TOPROL-XL) 25 MG 24 hr tablet TAKE ONE TABLET BY MOUTH EVERY MORNING AND TAKE TWO TABLETS BY MOUTH EVERY EVENING  . Multiple Vitamins-Minerals (CENTRUM SILVER PO) Take 1 tablet by mouth daily.  . rosuvastatin (CRESTOR) 5 MG tablet Take 1 tablet (5 mg total) by mouth daily at 6 PM.  . senna (SENOKOT) 8.6 MG TABS tablet Take 1 tablet (8.6 mg total) by mouth daily.  Maretta Bees 175 MCG/3ML SOLN Use one vial in nebulizer once daily.  Generic: revefenacin  . budesonide (PULMICORT) 0.5 MG/2ML nebulizer solution Take 2 mLs (0.5 mg total) by nebulization 2 (two) times daily.   Facility-Administered Encounter Medications as of 05/30/2019  Medication  . sodium chloride 0.9 % injection 10 mL  . sodium chloride flush (NS) 0.9 % injection 10 mL    Activities of Daily Living In your present state of health, do you have any difficulty performing the following activities: 05/30/2019 08/21/2018  Hearing? N N  Comment declines hearing aids -  Vision? N N  Difficulty concentrating or making decisions? N Y  Walking or climbing stairs? N Y  Comment - -  Dressing or bathing? N Y  Doing errands, shopping? N Y  Comment - -  Conservation officer, nature and eating ? N -  Using the Toilet? N -  In the past six months, have you accidently leaked urine? N -  Do you have problems with loss of bowel control? N -  Managing your Medications? N -  Managing your Finances? N -  Housekeeping or managing your Housekeeping? N -  Some recent data might be hidden    Patient Care Team: Glean Hess, MD as PCP - General (Internal Medicine) Rockey Situ, Kathlene November, MD as Consulting Physician (Cardiology) Cammie Sickle, MD as Consulting Physician (Internal Medicine) Alisa Graff, FNP as Nurse Practitioner  (Cardiology) Flora Lipps, MD as Consulting Physician (Pulmonary Disease) Marvia Pickles, MD as Consulting Physician (Cardiology)    Assessment:   This is a routine wellness examination for [place].  Exercise Activities and Dietary recommendations Current Exercise Habits: Home exercise routine, Type of exercise: walking;calisthenics;stretching, Time (Minutes): 20, Frequency (Times/Week): 3, Weekly Exercise (Minutes/Week): 60, Intensity: Mild, Exercise limited by: orthopedic condition(s);respiratory conditions(s)  Goals    . Cut out extra servings    . DIET - INCREASE WATER INTAKE     Recommend to drink at least 6-8 8oz glasses of water per day    . Prevent falls     Recommend preventing falls with balance and strengthening exercises.        Fall Risk Fall Risk  05/30/2019 08/21/2018 08/07/2018 07/21/2018 09/15/2017  Falls in the past year? 1 0 0 0 No  Number falls in past yr: 1 0 - - -  Injury with Fall? 1 0 - - -  Risk for fall due to : Impaired balance/gait;History of fall(s) - - - -  Follow up Falls prevention discussed - - - -    FALL RISK PREVENTION PERTAINING TO THE HOME:  Any stairs in or around the home? Yes  If so, do they handrails? Yes   Home free of loose throw rugs in walkways, pet beds, electrical cords, etc? Yes  Adequate  lighting in your home to reduce risk of falls? Yes   ASSISTIVE DEVICES UTILIZED TO PREVENT FALLS:  Life alert? No  life alert but does have apple watch with emergency options Use of a cane, walker or w/c? Yes  - walker Grab bars in the bathroom? Yes  Shower chair or bench in shower? Yes  Elevated toilet seat or a handicapped toilet? No  DME ORDERS:  DME order needed?  No   TIMED UP AND GO:  Was the test performed? No . Telephonic visit.   Education: Fall risk prevention has been discussed.  Intervention(s) required? No    Depression Screen PHQ 2/9 Scores 05/30/2019 09/15/2017 08/25/2017 07/28/2017  PHQ - 2 Score 0 0 0 2   PHQ- 9 Score 0 - - 10     Cognitive Function     6CIT Screen 05/30/2019 08/25/2017  What Year? 0 points 0 points  What month? 0 points 0 points  What time? 0 points 0 points  Count back from 20 0 points 0 points  Months in reverse 2 points 2 points  Repeat phrase 0 points 0 points  Total Score 2 2    Immunization History  Administered Date(s) Administered  . Influenza, High Dose Seasonal PF 06/17/2014, 07/04/2018  . Influenza,inj,Quad PF,6+ Mos 07/01/2014, 05/28/2015, 05/31/2016, 04/21/2017  . Influenza-Unspecified 07/20/2011, 05/03/2012, 07/01/2014, 05/28/2015, 05/31/2016, 04/21/2017  . Pneumococcal Conjugate-13 04/21/2017  . Pneumococcal Polysaccharide-23 08/03/2011  . Zoster 05/01/2011    Qualifies for Shingles Vaccine? Yes  Zostavax completed 2012. Due for Shingrix. Education has been provided regarding the importance of this vaccine. Pt has been advised to call insurance company to determine out of pocket expense. Advised may also receive vaccine at local pharmacy or Health Dept. Verbalized acceptance and understanding.  Tdap: Although this vaccine is not a covered service during a Wellness Exam, does the patient still wish to receive this vaccine today?  No .  Education has been provided regarding the importance of this vaccine. Advised may receive this vaccine at local pharmacy or Health Dept. Aware to provide a copy of the vaccination record if obtained from local pharmacy or Health Dept. Verbalized acceptance and understanding.  Flu Vaccine: Due for Flu vaccine. Does the patient want to receive this vaccine today?  No . Education has been provided regarding the importance of this vaccine but still declined. Advised may receive this vaccine at local pharmacy or Health Dept. Aware to provide a copy of the vaccination record if obtained from local pharmacy or Health Dept. Verbalized acceptance and understanding.  Pneumococcal Vaccine: Up to date   Screening Tests Health  Maintenance  Topic Date Due  . OPHTHALMOLOGY EXAM  01/24/1946  . TETANUS/TDAP  01/23/1957  . URINE MICROALBUMIN  08/25/2018  . HEMOGLOBIN A1C  09/01/2018  . FOOT EXAM  03/02/2019  . INFLUENZA VACCINE  03/31/2019  . MAMMOGRAM  05/29/2020 (Originally 03/15/2019)  . COLONOSCOPY  05/29/2020 (Originally 11/25/2014)  . PNA vac Low Risk Adult  Completed  . DEXA SCAN  Addressed    Cancer Screenings:  Colorectal Screening:  No longer required.   Mammogram: Completed 03/14/18. Repeat every year; pt declined repeat screening at this time  Bone Density: Completed 01/10/17. Results reflect  OSTEOPENIA. Repeat every 2 years. Pt declined repeat screening at this time.   Lung Cancer Screening: (Low Dose CT Chest recommended if Age 78-80 years, 30 pack-year currently smoking OR have quit w/in 15years.) does not qualify.   Additional Screening:  Hepatitis C Screening: no longer  required  Vision Screening: Recommended annual ophthalmology exams for early detection of glaucoma and other disorders of the eye. Is the patient up to date with their annual eye exam?  No  Who is the provider or what is the name of the office in which the pt attends annual eye exams? Not established If pt is not established with a provider, would they like to be referred to a provider to establish care? No . Ophthalmology referral has been placed. Pt aware the office will call re: appt.  Dental Screening: Recommended annual dental exams for proper oral hygiene  Community Resource Referral:  CRR required this visit?  No      Plan:     I have personally reviewed and addressed the Medicare Annual Wellness questionnaire and have noted the following in the patient's chart:  A. Medical and social history B. Use of alcohol, tobacco or illicit drugs  C. Current medications and supplements D. Functional ability and status E.  Nutritional status F.  Physical activity G. Advance directives H. List of other physicians I.   Hospitalizations, surgeries, and ER visits in previous 12 months J.  [place] such as hearing and vision if needed, cognitive and depression L. Referrals and appointments   In addition, I have reviewed and discussed with patient certain preventive protocols, quality metrics, and best practice recommendations. A written personalized care plan for preventive services as well as general preventive health recommendations were provided to patient.   Signed,  Clemetine Marker, LPN Nurse Health Advisor   Nurse Notes: pt doing well and appreciative of visit today. Advised due for office visit with Dr. Army Melia.

## 2019-05-30 NOTE — Patient Instructions (Signed)
Jenny Cooper , Thank you for taking time to come for your Medicare Wellness Visit. I appreciate your ongoing commitment to your health goals. Please review the following plan we discussed and let me know if I can assist you in the future.   Screening recommendations/referrals: Colonoscopy: done 2011. No longer required. Mammogram: done 03/14/18. Postponed. Bone Density: done 01/10/17. postponed Recommended yearly ophthalmology/optometry visit for glaucoma screening and checkup Recommended yearly dental visit for hygiene and checkup  Vaccinations: Influenza vaccine: due Pneumococcal vaccine: done 04/21/17 Tdap vaccine: due - please contact us if you get a cut or scrape Shingles vaccine: Shingrix discussed. Please contact your pharmacy for coverage information.   Advanced directives: Please bring a copy of your health care power of attorney and living will to the office at your convenience.  Conditions/risks identified: Recommend preventing falls with balance and strengthening exercises.  Next appointment: Please follow up in one year for your Medicare Annual Wellness visit.     Preventive Care 40 Years and Older, Female Preventive care refers to lifestyle choices and visits with your health care provider that can promote health and wellness. What does preventive care include?  A yearly physical exam. This is also called an annual well check.  Dental exams once or twice a year.  Routine eye exams. Ask your health care provider how often you should have your eyes checked.  Personal lifestyle choices, including:  Daily care of your teeth and gums.  Regular physical activity.  Eating a healthy diet.  Avoiding tobacco and drug use.  Limiting alcohol use.  Practicing safe sex.  Taking low-dose aspirin every day.  Taking vitamin and mineral supplements as recommended by your health care provider. What happens during an annual well check? The services and screenings done by your  health care provider during your annual well check will depend on your age, overall health, lifestyle risk factors, and family history of disease. Counseling  Your health care provider may ask you questions about your:  Alcohol use.  Tobacco use.  Drug use.  Emotional well-being.  Home and relationship well-being.  Sexual activity.  Eating habits.  History of falls.  Memory and ability to understand (cognition).  Work and work Statistician.  Reproductive health. Screening  You may have the following tests or measurements:  Height, weight, and BMI.  Blood pressure.  Lipid and cholesterol levels. These may be checked every 5 years, or more frequently if you are over 56 years old.  Skin check.  Lung cancer screening. You may have this screening every year starting at age 54 if you have a 30-pack-year history of smoking and currently smoke or have quit within the past 15 years.  Fecal occult blood test (FOBT) of the stool. You may have this test every year starting at age 108.  Flexible sigmoidoscopy or colonoscopy. You may have a sigmoidoscopy every 5 years or a colonoscopy every 10 years starting at age 80.  Hepatitis C blood test.  Hepatitis B blood test.  Sexually transmitted disease (STD) testing.  Diabetes screening. This is done by checking your blood sugar (glucose) after you have not eaten for a while (fasting). You may have this done every 1-3 years.  Bone density scan. This is done to screen for osteoporosis. You may have this done starting at age 60.  Mammogram. This may be done every 1-2 years. Talk to your health care provider about how often you should have regular mammograms. Talk with your health care provider about your test  results, treatment options, and if necessary, the need for more tests. Vaccines  Your health care provider may recommend certain vaccines, such as:  Influenza vaccine. This is recommended every year.  Tetanus, diphtheria, and  acellular pertussis (Tdap, Td) vaccine. You may need a Td booster every 10 years.  Zoster vaccine. You may need this after age 63.  Pneumococcal 13-valent conjugate (PCV13) vaccine. One dose is recommended after age 41.  Pneumococcal polysaccharide (PPSV23) vaccine. One dose is recommended after age 56. Talk to your health care provider about which screenings and vaccines you need and how often you need them. This information is not intended to replace advice given to you by your health care provider. Make sure you discuss any questions you have with your health care provider. Document Released: 09/12/2015 Document Revised: 05/05/2016 Document Reviewed: 06/17/2015 Elsevier Interactive Patient Education  2017 [employer] Prevention in the Home Falls can cause injuries. They can happen to people of all ages. There are many things you can do to make your home safe and to help prevent falls. What can I do on the outside of my home?  Regularly fix the edges of walkways and driveways and fix any cracks.  Remove anything that might make you trip as you walk through a door, such as a raised step or threshold.  Trim any bushes or trees on the path to your home.  Use bright outdoor lighting.  Clear any walking paths of anything that might make someone trip, such as rocks or tools.  Regularly check to see if handrails are loose or broken. Make sure that both sides of any steps have handrails.  Any raised decks and porches should have guardrails on the edges.  Have any leaves, snow, or ice cleared regularly.  Use sand or salt on walking paths during winter.  Clean up any spills in your garage right away. This includes oil or grease spills. What can I do in the bathroom?  Use night lights.  Install grab bars by the toilet and in the tub and shower. Do not use towel bars as grab bars.  Use non-skid mats or decals in the tub or shower.  If you need to sit down in the shower, use  a plastic, non-slip stool.  Keep the floor dry. Clean up any water that spills on the floor as soon as it happens.  Remove soap buildup in the tub or shower regularly.  Attach bath mats securely with double-sided non-slip rug tape.  Do not have throw rugs and other things on the floor that can make you trip. What can I do in the bedroom?  Use night lights.  Make sure that you have a light by your bed that is easy to reach.  Do not use any sheets or blankets that are too big for your bed. They should not hang down onto the floor.  Have a firm chair that has side arms. You can use this for support while you get dressed.  Do not have throw rugs and other things on the floor that can make you trip. What can I do in the kitchen?  Clean up any spills right away.  Avoid walking on wet floors.  Keep items that you use a lot in easy-to-reach places.  If you need to reach something above you, use a strong step stool that has a grab bar.  Keep electrical cords out of the way.  Do not use floor polish or wax that makes  floors slippery. If you must use wax, use non-skid floor wax.  Do not have throw rugs and other things on the floor that can make you trip. What can I do with my stairs?  Do not leave any items on the stairs.  Make sure that there are handrails on both sides of the stairs and use them. Fix handrails that are broken or loose. Make sure that handrails are as long as the stairways.  Check any carpeting to make sure that it is firmly attached to the stairs. Fix any carpet that is loose or worn.  Avoid having throw rugs at the top or bottom of the stairs. If you do have throw rugs, attach them to the floor with carpet tape.  Make sure that you have a light switch at the top of the stairs and the bottom of the stairs. If you do not have them, ask someone to add them for you. What else can I do to help prevent falls?  Wear shoes that:  Do not have high heels.  Have  rubber bottoms.  Are comfortable and fit you well.  Are closed at the toe. Do not wear sandals.  If you use a stepladder:  Make sure that it is fully opened. Do not climb a closed stepladder.  Make sure that both sides of the stepladder are locked into place.  Ask someone to hold it for you, if possible.  Clearly mark and make sure that you can see:  Any grab bars or handrails.  First and last steps.  Where the edge of each step is.  Use tools that help you move around (mobility aids) if they are needed. These include:  Canes.  Walkers.  Scooters.  Crutches.  Turn on the lights when you go into a dark area. Replace any light bulbs as soon as they burn out.  Set up your furniture so you have a clear path. Avoid moving your furniture around.  If any of your floors are uneven, fix them.  If there are any pets around you, be aware of where they are.  Review your medicines with your doctor. Some medicines can make you feel dizzy. This can increase your chance of falling. Ask your doctor what other things that you can do to help prevent falls. This information is not intended to replace advice given to you by your health care provider. Make sure you discuss any questions you have with your health care provider. Document Released: 06/12/2009 Document Revised: 01/22/2016 Document Reviewed: 09/20/2014 Elsevier Interactive Patient Education  2017 [employer].

## 2019-06-21 ENCOUNTER — Other Ambulatory Visit: Payer: Self-pay | Admitting: Internal Medicine

## 2019-06-21 DIAGNOSIS — J441 Chronic obstructive pulmonary disease with (acute) exacerbation: Secondary | ICD-10-CM

## 2019-07-12 ENCOUNTER — Encounter: Payer: Self-pay | Admitting: Radiation Oncology

## 2019-07-12 ENCOUNTER — Other Ambulatory Visit: Payer: Self-pay

## 2019-07-12 ENCOUNTER — Ambulatory Visit
Admission: RE | Admit: 2019-07-12 | Discharge: 2019-07-12 | Disposition: A | Discharge status: Home / Self Care | Payer: Medicare Other | Source: Ambulatory Visit | Attending: Radiation Oncology | Admitting: Radiation Oncology

## 2019-07-12 DIAGNOSIS — Z85118 Personal history of other malignant neoplasm of bronchus and lung: Secondary | ICD-10-CM | POA: Insufficient documentation

## 2019-07-12 DIAGNOSIS — C3491 Malignant neoplasm of unspecified part of right bronchus or lung: Secondary | ICD-10-CM

## 2019-07-12 DIAGNOSIS — Z923 Personal history of irradiation: Secondary | ICD-10-CM | POA: Insufficient documentation

## 2019-07-12 DIAGNOSIS — Z9221 Personal history of antineoplastic chemotherapy: Secondary | ICD-10-CM | POA: Insufficient documentation

## 2019-07-12 NOTE — Progress Notes (Signed)
Radiation Oncology Follow up Note  Name: Jenny Cooper   Date:   07/12/2019 MRN:  [id number] DOB: 1936-04-15   Radiation Oncology TeleHEALTH VISIT PROGRESS NOTE  I connected with Jenny Cooper    by telephone-Webex and verified that I am speaking with the correct person using two identifiers.  I discussed the limitations, risks, security and privacy concerns of performing an evaluation and management service by telemedicine and the availability of in-person appointments. I also discussed with the patient that there may be a patient responsible charge related to this service. The patient expressed understanding and agreed to proceed.    Other persons participating in the visit and their role in the encounter: None   Patient's location: Home   Provider's location: work  This 83 y.o. 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  She was contacted by telephone she is doing well specifically denies cough off hemoptysis or chest tightness..  She had a chest x-ray back in May showing stable right hilar fullness and perihilar scarring with no evidence of recurrent or progressive disease.  COMPLICATIONS OF TREATMENT: none  FOLLOW UP COMPLIANCE: keeps appointments   PHYSICAL EXAM:  There were no vitals taken for this visit. Exam not performed since this was a telephone interview  RADIOLOGY RESULTS: CT scan reviewed compatible with above-stated findings  PLAN: Present time patient is doing well 4 years out from concurrent chemoradiation therapy for stage III right hilar lung cancer.  I am pleased with her overall progress at this time I am going to discontinue  follow-up care based on her age and availability for attending follow-up.  Patient is comfortable with proceeding with follow-up care with her other physicians.  Patient knows to call at anytime with any concerns. A total of 30 minutes was spent reviewing patient's chart reviewing patient's films and contacting and speaking with the patient.  I would like to take this opportunity to thank you for allowing me to participate in the care of your patient.Noreene Filbert, MD

## 2019-07-24 ENCOUNTER — Telehealth: Payer: Self-pay | Admitting: Internal Medicine

## 2019-07-24 NOTE — Telephone Encounter (Signed)
Spoke to pt, who stated that she received a call from [employer] regarding oxygen re certification. Pt states per [employer], they received a signature too late. Pt was walked 04/05/2019 and had ov on 04/16/2019.  Suanne Marker, can you help with this?

## 2019-07-24 NOTE — Telephone Encounter (Signed)
Pt has been scheduled for OV and walk test on 08/17/2019 with Derl Barrow, NP.  Pt aware and voiced her understanding.

## 2019-07-24 NOTE — Telephone Encounter (Signed)
Per Melissa at [facility]. Pt was walked on 04/05/2019, pt was seen on 04/16/2019, however, the CMN was not signed by DK until 05/10/2019. Per [facility] guidelines all the above must be done in 30 days. Pt will need to see NP, have walk again and CMN signed within 30 days.  Per Lenna Sciara, [employer] will not take patient's oxygen away, just need to get patient back in to see NP.  Rhonda J Cobb

## 2019-08-17 ENCOUNTER — Other Ambulatory Visit: Payer: Self-pay

## 2019-08-17 ENCOUNTER — Other Ambulatory Visit
Admission: RE | Admit: 2019-08-17 | Discharge: 2019-08-17 | Disposition: A | Discharge status: Home / Self Care | Payer: Medicare Other | Source: Home / Self Care | Attending: Primary Care | Admitting: Primary Care

## 2019-08-17 ENCOUNTER — Encounter: Payer: Self-pay | Admitting: Primary Care

## 2019-08-17 ENCOUNTER — Ambulatory Visit (INDEPENDENT_AMBULATORY_CARE_PROVIDER_SITE_OTHER): Payer: Medicare Other | Admitting: Primary Care

## 2019-08-17 ENCOUNTER — Ambulatory Visit
Admission: RE | Admit: 2019-08-17 | Discharge: 2019-08-17 | Disposition: A | Discharge status: Home / Self Care | Payer: Medicare Other | Attending: Primary Care | Admitting: Primary Care

## 2019-08-17 ENCOUNTER — Ambulatory Visit
Admission: RE | Admit: 2019-08-17 | Discharge: 2019-08-17 | Disposition: A | Discharge status: Home / Self Care | Payer: Medicare Other | Source: Ambulatory Visit | Attending: Primary Care | Admitting: Primary Care

## 2019-08-17 VITALS — BP 110/82 | HR 62 | Resp 16 | Ht 66.0 in | Wt 158.0 lb

## 2019-08-17 DIAGNOSIS — J449 Chronic obstructive pulmonary disease, unspecified: Secondary | ICD-10-CM

## 2019-08-17 DIAGNOSIS — R0602 Shortness of breath: Secondary | ICD-10-CM | POA: Insufficient documentation

## 2019-08-17 DIAGNOSIS — J9611 Chronic respiratory failure with hypoxia: Secondary | ICD-10-CM

## 2019-08-17 DIAGNOSIS — J441 Chronic obstructive pulmonary disease with (acute) exacerbation: Secondary | ICD-10-CM | POA: Diagnosis not present

## 2019-08-17 DIAGNOSIS — I5042 Chronic combined systolic (congestive) and diastolic (congestive) heart failure: Secondary | ICD-10-CM

## 2019-08-17 LAB — BRAIN NATRIURETIC PEPTIDE: B Natriuretic Peptide: 678 pg/mL — ABNORMAL HIGH (ref 0.0–100.0)

## 2019-08-17 LAB — BASIC METABOLIC PANEL
Anion gap: 10 (ref 5–15)
BUN: 23 mg/dL (ref 8–23)
CO2: 27 mmol/L (ref 22–32)
Calcium: 8.7 mg/dL — ABNORMAL LOW (ref 8.9–10.3)
Chloride: 102 mmol/L (ref 98–111)
Creatinine, Ser: 1.27 mg/dL — ABNORMAL HIGH (ref 0.44–1.00)
GFR calc Af Amer: 46 mL/min — ABNORMAL LOW (ref >60)
GFR calc non Af Amer: 40 mL/min — ABNORMAL LOW (ref >60)
Glucose, Bld: 95 mg/dL (ref 70–99)
Potassium: 4.3 mmol/L (ref 3.5–5.1)
Sodium: 139 mmol/L (ref 135–145)

## 2019-08-17 LAB — CBC WITH DIFFERENTIAL/PLATELET
Abs Immature Granulocytes: 0.01 10*3/uL (ref 0.00–0.07)
Basophils Absolute: 0 10*3/uL (ref 0.0–0.1)
Basophils Relative: 1 %
Eosinophils Absolute: 0 10*3/uL (ref 0.0–0.5)
Eosinophils Relative: 0 %
HCT: 37 % (ref 36.0–46.0)
Hemoglobin: 11.6 g/dL — ABNORMAL LOW (ref 12.0–15.0)
Immature Granulocytes: 0 %
Lymphocytes Relative: 17 %
Lymphs Abs: 0.6 10*3/uL — ABNORMAL LOW (ref 0.7–4.0)
MCH: 30.3 pg (ref 26.0–34.0)
MCHC: 31.4 g/dL (ref 30.0–36.0)
MCV: 96.6 fL (ref 80.0–100.0)
Monocytes Absolute: 0.5 10*3/uL (ref 0.1–1.0)
Monocytes Relative: 13 %
Neutro Abs: 2.6 10*3/uL (ref 1.7–7.7)
Neutrophils Relative %: 69 %
Platelets: 189 10*3/uL (ref 150–400)
RBC: 3.83 MIL/uL — ABNORMAL LOW (ref 3.87–5.11)
RDW: 15.1 % (ref 11.5–15.5)
WBC: 3.7 10*3/uL — ABNORMAL LOW (ref 4.0–10.5)
nRBC: 0 % (ref 0.0–0.2)

## 2019-08-17 MED ORDER — FUROSEMIDE 40 MG PO TABS: 40.0000 mg | ORAL_TABLET | Freq: Every day | ORAL | 0 refills | Status: DC

## 2019-08-17 NOTE — Patient Instructions (Addendum)
Pleasure meeting you today Jenny Cooper  Orders: CXR today- sob/hypoxia  Renew oxygen order with DME- 4L on exertion; 2L at rest   Labs: BNP, CBC, BMET  Follow-up: We will call you after testing come back Sooner if symptoms do not improve/worse

## 2019-08-17 NOTE — Progress Notes (Signed)
BNP is elevated, recommend taking 40mg  lasix for 5 days. She can follow up with me in 1 week for televisit to see how she is doing or she can just follow-up with Cardiology or PCP to management diuretic dosing after that.

## 2019-08-17 NOTE — Progress Notes (Signed)
Please let patient know CXR looks stable.  Chronic scarring with minimal right pleural effusion. No edema or consolidation. Labs to follow

## 2019-08-17 NOTE — Progress Notes (Signed)
@Patient  ID: Jenny Cooper, female    DOB: 08/28/1936, 83 y.o.   MRN: [id number]  Chief Complaint  Patient presents with  . COPD    pt on 2 Liters 24/7; she is on 2 liters POC today. She c/o sob is worsening with exertion.2-3 weeks.    Referring provider: Glean Hess, MD  HPI: 83 year old female, former smoker. PMH significant for moderate COPD (Ratio 57% FEV1 58%), stage III squamous cell lung cancer (s/p chemoradiation 2016), new LLL mass negative for maliganancy and fibrosis. Currently on amiodarone therapy. Maintained on Pulmicort and Yupelri. PET scan showed no sig concerns for recurrent malignancy, radiation changes and pleural effusion. Patient of Dr. Mortimer Fries, last seen by telephone visit in August 2020.   08/17/2019 Patient presents today for regular office visit, needs walk test per DME company. Reports increased shortness of breath on exertion over the last several weeks. Wearing 2L oxygen continuously. Reports morning mucus production, typically light yellow sputum. Using Pulmicort nebulizer twice daily and Yupelri was prescribed. Continue oral anticoagulation. Takes Lasix once daily, occasionally misses a dose. Reports increase in her weight and abdominal swelling. She is up 4 lbs on her home scale in two weeks. Denies shortness of breath at rest, chest congestion, chest tightness/pain or wheezing.   Walk test: - Ambulatory O2 low 85% on 2l - Requiring 4L, recovered to 99% with rest   Allergies  Allergen Reactions  . Lovenox [Enoxaparin Sodium] Itching    Immunization History  Administered Date(s) Administered  . Influenza, High Dose Seasonal PF 06/17/2014, 07/04/2018  . Influenza,inj,Quad PF,6+ Mos 07/01/2014, 05/28/2015, 05/31/2016, 04/21/2017  . Influenza-Unspecified 07/20/2011, 05/03/2012, 07/01/2014, 05/28/2015, 05/31/2016, 04/21/2017  . Pneumococcal Conjugate-13 04/21/2017  . Pneumococcal Polysaccharide-23 08/03/2011  . Zoster 05/01/2011    Past Medical  History:  Diagnosis Date  . Acute respiratory failure with hypoxia ([place]) 12/11/2017  . Allergy   . Atypical atrial flutter ([place])    a. s/p ablation 07/27/2013 followed by Dr. Rockey Situ  . Balance problem   . CAD (coronary artery disease)    a. s/p MI x 2 in 2002 s/p PCI x 2 in 2002; b. s/p 2v CABG 2002; c. stress echo 07/2004 w/ evi of pos & inf infarct & no evi of ischemia; d. 4/08 dipyridamole scan w/ multiple areas of infarct, no ischemia, EF 49%; e. cath 04/28/15 3v CAD, med Rx rec, no targets for revasc, LM lum irregs, pLAD 30%, 100%, ost-pLCx 60%, [name] 99%, OM2 100%, p-mRCA 90%, [name] 100% L-R collats, VG-mLAD irregs, VG-OM2 oc  . Carcinoma of right lung ([place]) 01/03/2015   a. followed by Dr. Oliva Bustard  . Chronic systolic CHF (congestive heart failure) ([place])    a. echo 03/2015: EF 30-35%, sev ant/inf/pos HK, in mild to mod MR  . Complication of anesthesia    more recently patient oxygen levels do not rebound as quickly  . Compressed spine fracture ([place]) 08/06/2016   lumbar 2, t11, t12  . COPD (chronic obstructive pulmonary disease) ([place])   . Fractured pelvis ([name]) 05/26/2016   2 places  . GERD (gastroesophageal reflux disease)   . History of blood clots    12/2001 left leg  . History of colonoscopy 2013  . History of mammography, screening 2015  . History of Papanicolaou smear of cervix 2013  . HLD (hyperlipidemia)   . HTN (hypertension)   . Hypothyroidism   . Lung cancer ([place])   . Mitral regurgitation    a. s/p mitral ring placement  09/2000; b. echo 09/2010: EF 50%, inf HK, post AK, mild MR, prosthetic mitral valve ring w/ peak gradient of 10 mmHg; b. echo 2/13: EF 50%, mild MR/TR     . Myocardial infarction ([place])    X 2 (LAST ONE IN 2002)  . Neuropathy   . Pacemaker    a. MDT 2002; b. generator replacement 2013; c. followed by Dr. Omelia Blackwater, MD  . PAF (paroxysmal atrial fibrillation) ([place])    a. on Eliquis   . Personal history of chemotherapy   . Personal history of radiation therapy    . Pneumonia     Tobacco History: Social History   Tobacco Use  Smoking Status Former Smoker  . Packs/day: 1.00  . Years: 40.00  . Pack years: 40.00  . Types: Cigarettes  . Quit date: 08/30/2000  . Years since quitting: 18.9  Smokeless Tobacco Never Used  Tobacco Comment   quit smoking in 08/28/2000. Smoking cessation materials not required   Counseling given: Not Answered Comment: quit smoking in 08/28/2000. Smoking cessation materials not required   Outpatient Medications Prior to Visit  Medication Sig Dispense Refill  . acetaminophen (TYLENOL) 500 MG tablet Take 1,000 mg by mouth every 6 (six) hours as needed for mild pain or moderate pain.     Marland Kitchen amiodarone (PACERONE) 200 MG tablet TAKE 1 TABLET BY MOUTH AS DIRECTED (2 TABLETS TWICE DAILY x 5 DAYS THEN 1 TABLET TWICE A DAY THEREAFTER 180 tablet 3  . apixaban (ELIQUIS) 5 MG TABS tablet Take 1 tablet (5 mg total) by mouth 2 (two) times daily. 180 tablet 6  . budesonide (PULMICORT) 0.5 MG/2ML nebulizer solution INHALE 2 MILLILITERS BY NEBULIZER 2 TIMES DAILY 360 mL 5  . Calcium Carb-Cholecalciferol (CALCIUM 600-D PO) Take 1 tablet by mouth daily.     . cetirizine (ZYRTEC) 10 MG tablet TAKE (1) TABLET BY MOUTH EVERY DAY (Patient taking differently: Take 10 mg by mouth daily. ) 30 tablet 12  . ENTRESTO 24-26 MG TAKE (1) TABLET BY MOUTH TWICE DAILY 60 tablet 6  . furosemide (LASIX) 20 MG tablet Take 1 tablet (20 mg total) by mouth daily. 30 tablet 11  . ipratropium-albuterol (DUONEB) 0.5-2.5 (3) MG/3ML SOLN TAKE 3 MLS BY NEBULIZATION EVERY 6 HOURSAS NEEDED FOR WHEEZING OR SHORTNESS OF BREATH 360 mL 1  . levothyroxine (SYNTHROID) 88 MCG tablet TAKE (1) TABLET BY MOUTH EVERY DAY BEFORE BREAKFAST. 30 tablet 5  . metoprolol succinate (TOPROL-XL) 25 MG 24 hr tablet TAKE ONE TABLET BY MOUTH EVERY MORNING AND TAKE TWO TABLETS BY MOUTH EVERY EVENING 270 tablet 3  . Multiple Vitamins-Minerals (CENTRUM SILVER PO) Take 1 tablet by mouth daily.      . rosuvastatin (CRESTOR) 5 MG tablet Take 1 tablet (5 mg total) by mouth daily at 6 PM. 90 tablet 3  . senna (SENOKOT) 8.6 MG TABS tablet Take 1 tablet (8.6 mg total) by mouth daily. 30 each 0  . YUPELRI 175 MCG/3ML SOLN Use one vial in nebulizer once daily.  Generic: revefenacin 200 mL 11   Facility-Administered Medications Prior to Visit  Medication Dose Route Frequency Provider Last Rate Last Admin  . sodium chloride 0.9 % injection 10 mL  10 mL Intravenous PRN Choksi, Janak, MD   10 mL at 02/19/15 1000  . sodium chloride flush (NS) 0.9 % injection 10 mL  10 mL Intravenous PRN Cammie Sickle, MD   10 mL at 02/16/16 1048   Review of Systems  Review of Systems  Constitutional: Positive for unexpected weight change.  Respiratory: Positive for cough and shortness of breath. Negative for chest tightness and wheezing.   Cardiovascular: Negative for leg swelling.    Physical Exam  BP 110/82 (BP Location: Left Arm, Cuff Size: Normal)   Pulse 62   Resp 16   Ht 5\' 6"  (1.676 m)   Wt 158 lb (71.7 kg)   SpO2 92%   BMI 25.50 kg/m  Physical Exam Constitutional:      Appearance: Normal appearance.  Cardiovascular:     Rate and Rhythm: Normal rate and regular rhythm.  Pulmonary:     Effort: Pulmonary effort is normal. No respiratory distress.     Breath sounds: No stridor. No wheezing or rhonchi.     Comments: Diminished right base Neurological:     General: No focal deficit present.     Mental Status: She is alert and oriented to person, place, and time.  Psychiatric:        Mood and Affect: Mood normal.        Behavior: Behavior normal.        Thought Content: Thought content normal.        Judgment: Judgment normal.      Lab Results:  CBC    Component Value Date/Time   WBC 3.7 (L) 08/17/2019 1303   RBC 3.83 (L) 08/17/2019 1303   HGB 11.6 (L) 08/17/2019 1303   HGB 11.9 04/23/2015 1117   HCT 37.0 08/17/2019 1303   HCT 38.2 04/23/2015 1117   PLT 189 08/17/2019  1303   PLT 263 04/23/2015 1117   MCV 96.6 08/17/2019 1303   MCV 95 04/23/2015 1117   MCV 92 12/24/2014 1457   MCH 30.3 08/17/2019 1303   MCHC 31.4 08/17/2019 1303   RDW 15.1 08/17/2019 1303   RDW 16.6 (H) 04/23/2015 1117   RDW 21.9 (H) 12/24/2014 1457   LYMPHSABS 0.6 (L) 08/17/2019 1303   LYMPHSABS 0.6 (L) 04/23/2015 1117   LYMPHSABS 1.0 12/24/2014 1457   MONOABS 0.5 08/17/2019 1303   MONOABS 0.8 12/24/2014 1457   EOSABS 0.0 08/17/2019 1303   EOSABS 0.5 (H) 04/23/2015 1117   EOSABS 0.2 12/24/2014 1457   BASOSABS 0.0 08/17/2019 1303   BASOSABS 0.0 04/23/2015 1117   BASOSABS 0.0 12/24/2014 1457    BMET    Component Value Date/Time   NA 139 08/17/2019 1303   NA 140 09/04/2018 1516   NA 136 12/24/2014 1457   K 4.3 08/17/2019 1303   K 3.6 12/24/2014 1457   CL 102 08/17/2019 1303   CL 98 (L) 12/24/2014 1457   CO2 27 08/17/2019 1303   CO2 32 12/24/2014 1457   GLUCOSE 95 08/17/2019 1303   GLUCOSE 107 (H) 12/24/2014 1457   BUN 23 08/17/2019 1303   BUN 13 09/04/2018 1516   BUN 17 12/24/2014 1457   CREATININE 1.27 (H) 08/17/2019 1303   CREATININE 1.00 12/24/2014 1457   CALCIUM 8.7 (L) 08/17/2019 1303   CALCIUM 9.5 12/24/2014 1457   GFRNONAA 40 (L) 08/17/2019 1303   GFRNONAA 55 (L) 12/24/2014 1457   GFRAA 46 (L) 08/17/2019 1303   GFRAA >60 12/24/2014 1457    BNP    Component Value Date/Time   BNP 678.0 (H) 08/17/2019 1303    ProBNP No results found for: PROBNP  Imaging: DG Chest 2 View  Result Date: 08/17/2019 CLINICAL DATA:  Shortness of breath EXAM: CHEST - 2 VIEW COMPARISON:  Jan 18, 2019 FINDINGS: There is scarring in the right  hilum and right mid lung regions. There is also scarring in the right base with small right pleural effusion. There is underlying fibrosis in the lower lung regions, stable. No new opacity evident. Patient is status post mitral valve replacement. Pacemaker leads are attached to the right atrium and right ventricle. Patient is status post  coronary artery bypass grafting. Patient is undergone several kyphoplasty procedures in the lower thoracic and upper lumbar regions. There is aortic atherosclerosis. No adenopathy appreciable by radiography. IMPRESSION: Stable areas of fibrosis and scarring with scarring primarily present in the right perihilar and posterior mid lung regions. There is also scarring in the right base with rather minimal right pleural effusion. No edema or consolidation evident. Stable cardiac silhouette with postoperative changes as noted. Port-A-Cath tip in superior vena cava. Electronically Signed   By: Lowella Grip III M.D.   On: 08/17/2019 13:43     Assessment & Plan:   Chronic respiratory failure with hypoxia (HCC) - Ambulatory O2 85% on 2L - Renew oxygen order with DME- needs 4L on exertion; 2L at rest   Chronic obstructive pulmonary disease (HCC) - Increased shortness of breath on exertion. No significant new cough or chest tightness/wheezing. Increased weight and abdominal swelling. Symptoms not consistent with COPD exacerbation - CXR 12/18 showed chronic scarring with minimal right pleural effusion. No edema or consolidation - Continue Pulmicort nebulizer BID and Yupelri   Chronic combined systolic and diastolic CHF (congestive heart failure) ([place]) - Increased DOE with increased weight. BNP > 600. Advised patient take 40mg  lasix for 5 days. She can follow up with me in 1 week for televisit to see how she is doing or she can just follow-up with Cardiology or PCP to management diuretic dosing after that.    Martyn Ehrich, NP 08/25/2019

## 2019-08-22 ENCOUNTER — Telehealth: Payer: Self-pay | Admitting: Internal Medicine

## 2019-08-22 NOTE — Telephone Encounter (Signed)
Lm for Jenny Cooper with [employer].  This information has been updated within pt's [facility] note.

## 2019-08-22 NOTE — Telephone Encounter (Signed)
Jenny Cooper is that PCC's note has been updated and voiced her understanding. Nothing further is needed.

## 2019-08-25 ENCOUNTER — Encounter: Payer: Self-pay | Admitting: Primary Care

## 2019-08-25 NOTE — Assessment & Plan Note (Addendum)
-   Increased shortness of breath on exertion. No significant new cough or chest tightness/wheezing. Increased weight and abdominal swelling. Symptoms not consistent with COPD exacerbation - CXR 12/18 showed chronic scarring with minimal right pleural effusion. No edema or consolidation - Continue Pulmicort nebulizer BID and Maretta Bees

## 2019-08-25 NOTE — Assessment & Plan Note (Signed)
-   Increased DOE with increased weight. BNP > 600. Advised patient take 40mg  lasix for 5 days. She can follow up with me in 1 week for televisit to see how she is doing or she can just follow-up with Cardiology or PCP to management diuretic dosing after that.

## 2019-08-25 NOTE — Assessment & Plan Note (Addendum)
-   Ambulatory O2 85% on 2L - Renew oxygen order with DME- needs 4L on exertion; 2L at rest

## 2019-08-28 ENCOUNTER — Ambulatory Visit (INDEPENDENT_AMBULATORY_CARE_PROVIDER_SITE_OTHER): Payer: Medicare Other | Admitting: Primary Care

## 2019-08-28 ENCOUNTER — Encounter: Payer: Self-pay | Admitting: Primary Care

## 2019-08-28 ENCOUNTER — Other Ambulatory Visit: Payer: Self-pay

## 2019-08-28 DIAGNOSIS — R05 Cough: Secondary | ICD-10-CM

## 2019-08-28 DIAGNOSIS — J3489 Other specified disorders of nose and nasal sinuses: Secondary | ICD-10-CM | POA: Diagnosis not present

## 2019-08-28 DIAGNOSIS — J441 Chronic obstructive pulmonary disease with (acute) exacerbation: Secondary | ICD-10-CM | POA: Diagnosis not present

## 2019-08-28 MED ORDER — PREDNISONE 10 MG PO TABS: ORAL_TABLET | ORAL | 0 refills | Status: DC

## 2019-08-28 MED ORDER — DOXYCYCLINE HYCLATE 100 MG PO TABS: 100.0000 mg | ORAL_TABLET | Freq: Two times a day (BID) | ORAL | 0 refills | Status: DC

## 2019-08-28 NOTE — Progress Notes (Signed)
Virtual Visit via Telephone Note  I connected with Jenny Cooper on 08/28/19 at 11:00 AM EST by telephone and verified that I am speaking with the correct person using two identifiers.  Location: Patient: Home Provider: Office/[place]   I discussed the limitations, risks, security and privacy concerns of performing an evaluation and management service by telephone and the availability of in person appointments. I also discussed with the patient that there may be a patient responsible charge related to this service. The patient expressed understanding and agreed to proceed.   History of Present Illness: 83 year old female, former smoker. PMH significant for moderate COPD (Ratio 57% FEV1 58%), stage III squamous cell lung cancer (s/p chemoradiation 2016), new LLL mass negative for maliganancy and fibrosis. Currently on amiodarone therapy. Maintained on Pulmicort and Yupelri. PET scan showed no sig concerns for recurrent malignancy, radiation changes and pleural effusion. Patient of Dr. Mortimer Fries, last seen by telephone visit in August 2020.   08/17/2019 Patient presents today for regular office visit, needs walk test per DME company. Reports increased shortness of breath on exertion over the last several weeks. Wearing 2L oxygen continuously. Reports morning mucus production, typically light yellow sputum. Using Pulmicort nebulizer twice daily and Yupelri was prescribed. Continue oral anticoagulation. Takes Lasix once daily, occasionally misses a dose. Reports increase in her weight and abdominal swelling. She is up 4 lbs on her home scale in two weeks. Ambulatory O2 low 85% on 2l. Requiring 4L, recovered to 99% with rest. Denies shortness of breath at rest, chest congestion, chest tightness/pain or wheezing.   08/28/2019 Patient contacted today for 2 week follow-up televisit. Reports that she is still coughing, mostly in the morning. Associated nasal drainage and hacking cough in the afternoon. She  did not notice an improvement in breathing after increasing her lasix x 5 days. Reports that she gets out of breath with walking, esp worse the last 3 weeks. She remains on eliquis Denies chest pain, chest tightness, hemoptysis.    Observations/Objective:  - Able to speak in full sentences - No noticeable shortness of breath, wheezing or cough  Assessment and Plan:  COPD: - Increased dyspnea with cough; no improvement after increasing her lasix for 5 days - Treating for possible COPD exacerbation with Doxycycline and prednisone  - Recommend using duoneb twice daily  - CXR 12/18/ showed stable areas of fibrosis/scarring and minimal right pleural effusion. No edema or consolidation  Follow Up Instructions:   - Due for 6 month follow-up with Dr. Mortimer Fries February 2021  I discussed the assessment and treatment plan with the patient. The patient was provided an opportunity to ask questions and all were answered. The patient agreed with the plan and demonstrated an understanding of the instructions.   The patient was advised to call back or seek an in-person evaluation if the symptoms worsen or if the condition fails to improve as anticipated.  I provided 18 minutes of non-face-to-face time during this encounter.   Martyn Ehrich, NP

## 2019-08-29 ENCOUNTER — Other Ambulatory Visit: Payer: Self-pay | Admitting: Cardiovascular Disease

## 2019-08-29 ENCOUNTER — Encounter: Payer: Self-pay | Admitting: *Deleted

## 2019-08-30 ENCOUNTER — Encounter: Payer: Self-pay | Admitting: Primary Care

## 2019-08-30 NOTE — Patient Instructions (Addendum)
  COPD - RX Doxycycline and prednisone taper - Recommend using duoneb twice daily  - CXR 12/18/ showed stable areas of fibrosis/scarring and minimal right pleural effusion. No edema or consolidation  Follow-up: - 6 month fu with Dr. Mortimer Fries in February

## 2019-08-31 NOTE — Telephone Encounter (Signed)
Would recommend an office visit She used to be on Lasix 40 twice a day  On the telemetry visit April 2020 she reported she was on Lasix 40 daily  I do not know if the change was made on her own  would stay on Lasix 40 daily at least For shortness of breath symptoms may need 40 twice a day Unclear why she has weaned the Lasix from 40 twice a day down to less than 40 daily over the past year? It is asking for CHF exacerbation  Variable heart rate could be from arrhythmia On last telemetry visit it was felt she may have had episode of paroxysmal atrial fibrillation/flutter despite having ablation in the past We can evaluate on office visit

## 2019-09-04 NOTE — Telephone Encounter (Signed)
Called and spoke with patient regarding her Mychart message and provider request for appointment. She verbalized understanding and confirmed appointment. She did mention that if for some reason she is unable to get appointment then she would call back.

## 2019-09-06 ENCOUNTER — Ambulatory Visit (INDEPENDENT_AMBULATORY_CARE_PROVIDER_SITE_OTHER): Payer: Medicare Other | Admitting: Family

## 2019-09-06 ENCOUNTER — Other Ambulatory Visit
Admission: RE | Admit: 2019-09-06 | Discharge: 2019-09-06 | Disposition: A | Discharge status: Home / Self Care | Payer: Medicare Other | Source: Ambulatory Visit | Attending: Family | Admitting: Family

## 2019-09-06 ENCOUNTER — Other Ambulatory Visit: Payer: Self-pay

## 2019-09-06 ENCOUNTER — Encounter: Payer: Self-pay | Admitting: Family

## 2019-09-06 VITALS — BP 124/70 | HR 61 | Ht 66.0 in | Wt 156.2 lb

## 2019-09-06 DIAGNOSIS — I480 Paroxysmal atrial fibrillation: Secondary | ICD-10-CM

## 2019-09-06 DIAGNOSIS — J449 Chronic obstructive pulmonary disease, unspecified: Secondary | ICD-10-CM

## 2019-09-06 DIAGNOSIS — I25709 Atherosclerosis of coronary artery bypass graft(s), unspecified, with unspecified angina pectoris: Secondary | ICD-10-CM

## 2019-09-06 DIAGNOSIS — I5042 Chronic combined systolic (congestive) and diastolic (congestive) heart failure: Secondary | ICD-10-CM | POA: Insufficient documentation

## 2019-09-06 DIAGNOSIS — I10 Essential (primary) hypertension: Secondary | ICD-10-CM

## 2019-09-06 DIAGNOSIS — I483 Typical atrial flutter: Secondary | ICD-10-CM

## 2019-09-06 LAB — BASIC METABOLIC PANEL
Anion gap: 13 (ref 5–15)
BUN: 35 mg/dL — ABNORMAL HIGH (ref 8–23)
CO2: 25 mmol/L (ref 22–32)
Calcium: 9.4 mg/dL (ref 8.9–10.3)
Chloride: 100 mmol/L (ref 98–111)
Creatinine, Ser: 1.45 mg/dL — ABNORMAL HIGH (ref 0.44–1.00)
GFR calc Af Amer: 39 mL/min — ABNORMAL LOW (ref >60)
GFR calc non Af Amer: 34 mL/min — ABNORMAL LOW (ref >60)
Glucose, Bld: 125 mg/dL — ABNORMAL HIGH (ref 70–99)
Potassium: 4.6 mmol/L (ref 3.5–5.1)
Sodium: 138 mmol/L (ref 135–145)

## 2019-09-06 LAB — BRAIN NATRIURETIC PEPTIDE: B Natriuretic Peptide: 710 pg/mL — ABNORMAL HIGH (ref 0.0–100.0)

## 2019-09-06 MED ORDER — FUROSEMIDE 20 MG PO TABS: ORAL_TABLET | ORAL | 11 refills | Status: DC

## 2019-09-06 NOTE — Progress Notes (Signed)
Office Visit    Patient Name: Jenny Cooper Date of Encounter: 09/06/2019  Primary Care Provider:  Glean Hess, MD Primary Cardiologist:  No primary care provider on file. Electrophysiologist:  None   Chief Complaint    Jenny Cooper is a 84 y.o. female with a hx of COPD on 2L,former smoker, stabge III squamos cell lung cancer (s/p chemoradiation 2016), new LLL mass negative for maliognancy and fibrosis  presents today for shortness of breath.   Past Medical History    Past Medical History:  Diagnosis Date  . Acute respiratory failure with hypoxia ([place]) 12/11/2017  . Allergy   . Atypical atrial flutter ([place])    a. s/p ablation 07/27/2013 followed by Dr. Rockey Cooper  . Balance problem   . CAD (coronary artery disease)    a. s/p MI x 2 in 2002 s/p PCI x 2 in 2002; b. s/p 2v CABG 2002; c. stress echo 07/2004 w/ evi of pos & inf infarct & no evi of ischemia; d. 4/08 dipyridamole scan w/ multiple areas of infarct, no ischemia, EF 49%; e. cath 04/28/15 3v CAD, med Rx rec, no targets for revasc, LM lum irregs, pLAD 30%, 100%, ost-pLCx 60%, [name] 99%, OM2 100%, p-mRCA 90%, [name] 100% L-R collats, VG-mLAD irregs, VG-OM2 oc  . Carcinoma of right lung ([place]) 01/03/2015   a. followed by Dr. Oliva Cooper  . Chronic systolic CHF (congestive heart failure) ([place])    a. echo 03/2015: EF 30-35%, sev ant/inf/pos HK, in mild to mod MR  . Complication of anesthesia    more recently patient oxygen levels do not rebound as quickly  . Compressed spine fracture ([place]) 08/06/2016   lumbar 2, t11, t12  . COPD (chronic obstructive pulmonary disease) ([place])   . Fractured pelvis ([place]) 05/26/2016   2 places  . GERD (gastroesophageal reflux disease)   . History of blood clots    12/2001 left leg  . History of colonoscopy 2013  . History of mammography, screening 2015  . History of Papanicolaou smear of cervix 2013  . HLD (hyperlipidemia)   . HTN (hypertension)   . Hypothyroidism   . Lung cancer ([place])   .  Mitral regurgitation    a. s/p mitral ring placement 09/2000; b. echo 09/2010: EF 50%, inf HK, post AK, mild MR, prosthetic mitral valve ring w/ peak gradient of 10 mmHg; b. echo 2/13: EF 50%, mild MR/TR     . Myocardial infarction ([place])    X 2 (LAST ONE IN 2002)  . Neuropathy   . Pacemaker    a. MDT 2002; b. generator replacement 2013; c. followed by Dr. Omelia Blackwater, MD  . PAF (paroxysmal atrial fibrillation) ([place])    a. on Eliquis   . Personal history of chemotherapy   . Personal history of radiation therapy   . Pneumonia    Past Surgical History:  Procedure Laterality Date  . ABLATION  04/2016   [facility]  . APPENDECTOMY    . CARDIAC CATHETERIZATION N/A 04/28/2015   Procedure: Left Heart Cath and Coronary Angiography;  Surgeon: Jenny Hampshire, MD;  Location: [facility];  Service: Cardiovascular;  Laterality: N/A;  . CARDIOVERSION N/A 04/08/2017   Procedure: CARDIOVERSION;  Surgeon: Jenny Hampshire, MD;  Location: [facility];  Service: Cardiovascular;  Laterality: N/A;  . CHEST TUBE INSERTION N/A 07/11/2018   Procedure: PLEURX CATH INSERTION;  Surgeon: Jenny Lewandowsky, MD;  Location: [facility];  Service: Thoracic;  Laterality: N/A;  . COLONOSCOPY  12/2009   2 small tubular adenomas  . CORONARY ARTERY BYPASS GRAFT  09/2000  . DRAIN REMOVAL Right 08/10/2018   Procedure: DRAIN REMOVAL;  Surgeon: Jenny Lewandowsky, MD;  Location: [facility];  Service: General;  Laterality: Right;  . ECTOPIC PREGNANCY SURGERY    . ELECTROMAGNETIC NAVIGATION BROCHOSCOPY N/A 10/06/2015   Procedure: ELECTROMAGNETIC NAVIGATION BRONCHOSCOPY;  Surgeon: Jenny Lipps, MD;  Location: [facility];  Service: Cardiopulmonary;  Laterality: N/A;  . ENDOBRONCHIAL ULTRASOUND N/A 10/06/2015   Procedure: ENDOBRONCHIAL ULTRASOUND;  Surgeon: Jenny Lipps, MD;  Location: [facility];  Service: Cardiopulmonary;  Laterality: N/A;  . FLEXIBLE BRONCHOSCOPY Right 07/11/2018   Procedure: FLEXIBLE BRONCHOSCOPY;  Surgeon: Jenny Lewandowsky, MD;  Location:  [facility];  Service: Thoracic;  Laterality: Right;  . KYPHOPLASTY N/A 09/28/2016   Procedure: KYPHOPLASTY;  Surgeon: Jenny Knows, MD;  Location: [facility];  Service: Orthopedics;  Laterality: N/A;  . KYPHOPLASTY N/A 02/20/2018   Procedure: [id number];  Surgeon: Jenny Knows, MD;  Location: [facility];  Service: Orthopedics;  Laterality: N/A;  . PACEMAKER INSERTION  03/2012  . thorocentesis  12/24/2016  . VAGINAL HYSTERECTOMY     partial - left ovary remains  . VIDEO ASSISTED THORACOSCOPY Right 07/11/2018   Procedure: VIDEO ASSISTED THORACOSCOPY;  Surgeon: Jenny Lewandowsky, MD;  Location: [facility];  Service: Thoracic;  Laterality: Right;  Marland Kitchen VIDEO ASSISTED THORACOSCOPY (VATS) W/TALC PLEUADESIS Right 07/11/2018   Procedure: THORACOTOMY PLEURAL BIOPSY WITH TALC PLEURODESIS;  Surgeon: Jenny Lewandowsky, MD;  Location: [facility];  Service: Thoracic;  Laterality: Right;  pleural biopsy    Allergies  Allergies  Allergen Reactions  . Lovenox [Enoxaparin Sodium] Itching    History of Present Illness    Jenny Cooper is a 84 y.o. female with a hx of COPD on 2L,former smoker, stabge III squamos cell lung cancer (s/p chemoradiation 2016), new LLL mass negative for maliognancy and fibrosis last seen 12/26/18 by Dr.Gollan.   Tells me her shortness of breath started around Thanksgiving.  Tells me she gained 8 pounds over 2-week.  And was started on Lasix 40 mg by her pulmonologist.  Prior to that she was on 20 mg daily.  There has been lots of changing of her furosemide over the last 1 to 2 years with doses as well as 20 mg daily and as high as 40 mg twice daily.  Tells me after the extra 5 days of Lasix she did have some decreasing edema but still noted shortness of breath and cough.  She had a follow-up with her pulmonologist and still noted shortness of breath and cough.  She was started on doxycycline and prednisone 08/08/2019 by pulmonology.  She is down to 153 pounds at home.  Tells me that despite  the addition of prednisone she has had no recurrent atrial fibrillation, atrial flutter.  She follows with EP at [facility]  EKGs/Labs/Other Studies Reviewed:   The following studies were reviewed today:  Echocardiogram 08/22/2018 Left ventricle: The cavity size was normal. Wall thickness was   normal. Systolic function was normal. The estimated ejection   fraction was in the range of 55% to 60%. Features are consistent   with a pseudonormal left ventricular filling pattern, with   concomitant abnormal relaxation and increased filling pressure   (grade 2 diastolic dysfunction). - Aortic valve: There was mild stenosis. Mean gradient (S): 7 mm   Hg. Valve area (VTI): 1.31 cm^2. - Mitral valve: Calcified annulus. There was mild to moderate   regurgitation. - Left atrium: The  atrium was mildly dilated. - Pulmonary arteries: Systolic pressure was moderately to severely   increased. PA peak pressure: 63 mm Hg (S). - Inferior vena cava: The vessel was dilated. The respirophasic   diameter changes were blunted (< 50%), consistent with elevated   central venous pressure. - Pericardium, extracardiac: A trivial pericardial effusion was   identified.   EKG:  EKG is ordered today.  The ekg ordered today demonstrates atrial paced rhythm rate 61 bpm with prolonged AV conduction (PR 352)  Recent Labs: 10/17/2018: TSH 5.657 01/15/2019: ALT 15 08/17/2019: B Natriuretic Peptide 678.0; BUN 23; Creatinine, Ser 1.27; Hemoglobin 11.6; Platelets 189; Potassium 4.3; Sodium 139  Recent Lipid Panel    Component Value Date/Time   CHOL 131 08/25/2017 0937   TRIG 159 (H) 08/25/2017 0937   HDL 36 (L) 08/25/2017 0937   CHOLHDL 3.6 08/25/2017 0937   LDLCALC 63 08/25/2017 0937    Home Medications   Current Meds  Medication Sig  . acetaminophen (TYLENOL) 500 MG tablet Take 1,000 mg by mouth every 6 (six) hours as needed for mild pain or moderate pain.   Marland Kitchen amiodarone (PACERONE) 200 MG tablet TAKE 1 TABLET BY  MOUTH AS DIRECTED (2 TABLETS TWICE DAILY x 5 DAYS THEN 1 TABLET TWICE A DAY THEREAFTER (Patient taking differently: Take 200 mg by mouth daily. )  . apixaban (ELIQUIS) 5 MG TABS tablet Take 1 tablet (5 mg total) by mouth 2 (two) times daily.  . budesonide (PULMICORT) 0.5 MG/2ML nebulizer solution INHALE 2 MILLILITERS BY NEBULIZER 2 TIMES DAILY  . Calcium Carb-Cholecalciferol (CALCIUM 600-D PO) Take 1 tablet by mouth daily.   . cetirizine (ZYRTEC) 10 MG tablet TAKE (1) TABLET BY MOUTH EVERY DAY (Patient taking differently: Take 10 mg by mouth daily. )  . doxycycline (VIBRA-TABS) 100 MG tablet Take 1 tablet (100 mg total) by mouth 2 (two) times daily.  Marland Kitchen ENTRESTO 24-26 MG TAKE (1) TABLET BY MOUTH TWICE DAILY  . furosemide (LASIX) 20 MG tablet Take 1 tablet (20 mg total) by mouth daily.  Marland Kitchen ipratropium-albuterol (DUONEB) 0.5-2.5 (3) MG/3ML SOLN TAKE 3 MLS BY NEBULIZATION EVERY 6 HOURSAS NEEDED FOR WHEEZING OR SHORTNESS OF BREATH  . levothyroxine (SYNTHROID) 88 MCG tablet TAKE (1) TABLET BY MOUTH EVERY DAY BEFORE BREAKFAST.  . metoprolol succinate (TOPROL-XL) 25 MG 24 hr tablet TAKE ONE TABLET BY MOUTH EVERY MORNING AND TAKE TWO TABLETS BY MOUTH EVERY EVENING  . Multiple Vitamins-Minerals (CENTRUM SILVER PO) Take 1 tablet by mouth daily.  . predniSONE (DELTASONE) 10 MG tablet Take 2 tabs once daily x 5 days; 1 tab daily x 5 days; then stop  . rosuvastatin (CRESTOR) 5 MG tablet TAKE ONE (1) TABLET BY MOUTH ONCE DAILY  . senna (SENOKOT) 8.6 MG TABS tablet Take 1 tablet (8.6 mg total) by mouth daily.  Maretta Bees 175 MCG/3ML SOLN Use one vial in nebulizer once daily.  Generic: revefenacin      Review of Systems      Review of Systems  Constitution: Negative for chills, fever and malaise/fatigue.  Cardiovascular: Positive for dyspnea on exertion and leg swelling. Negative for chest pain, near-syncope, orthopnea, palpitations and syncope.  Respiratory: Positive for shortness of breath. Negative for  wheezing.   Gastrointestinal: Negative for nausea and vomiting.  Neurological: Negative for dizziness, light-headedness and weakness.   All other systems reviewed and are otherwise negative except as noted above.  Physical Exam    VS:  Ht _0  (1.676 m)   Wt  156 lb 4 oz (70.9 kg)   BMI 25.22 kg/m  , BMI Body mass index is 25.22 kg/m. GEN: Well nourished, well developed, in no acute distress. HEENT: normal. Neck: Supple, no JVD, carotid bruits, or masses. Cardiac: RRR, no murmurs, rubs, or gallops. No clubbing, cyanosis, edema.  Radials/DP/PT 2+ and equal bilaterally.  Respiratory:  Respirations regular and unlabored, clear to auscultation bilaterally. GI: Soft, nontender, nondistended, BS + x 4. MS: No deformity or atrophy. Skin: Warm and dry, no rash. Neuro:  Strength and sensation are intact. Psych: Normal affect.  Assessment & Plan    1. Chronic combined systolic and diastolic heart failure - Echo 07/2018 EF 55-60%, grII DD. NYHA II-III in setting of COPD exacerbation.   Continue Lasix 83m daily. She may take an additional tablet for weight gain of 3lb overnight, 4 lb in 1 week, or fluid retention. May need to be increased to Lasix 466mdaily. Previously dose reduced due to hypotension.   GDMT includes Entresto 24-2633mLasix, Toprol.  BMET, BNP today.   If symptoms not improved after resolution of COPD exacerbation, as below, consider repeat echo.  Continue low sodium diet.   2. COPD - On home O2, dose recently increased by pulmonology. Symptomatic with SOB and cough today. Presently on doxycycline and prednisone. Continue to follow with pulmonology. Likely etiology of some of SOB.  3. HTN - BP well controlled. Continue present antihypertensive regimen.   4. Paroxysmal atrial fib/flutter - Denies palpitations. EKG today atrial paced. Continue Eliquis 5mg19mD, Metoprolol, amiodarone. Follows with EP at [facility] unable to review device checks but she tells me they are  "good".   5. Chronic anticoagulation - Secondary to PAF. Denies bleeding complications. Continue Eliquis 5mg 70m.  6. On Amiodarone therapy - Secondary to PAF. No signs of toxicity. TSH followed by PCP for hypothyroidism - 10/17/18 5.67. Liver functio normal 12/2018.   7. CAD s/p CABG - No anginal symptoms. Continue GDMT aspirin, beta blocker, statin.   8. Presence of PPM - Follows with Dr. JacksGlennon Macuke.[place]. Ed45cated about COVID 19 - 79e is very interested in getting the vaccine. She was provided with information.   Disposition: Follow up in 3 month(s) with Dr. GollaRockey SituPP.   CaitlLoel Dubonnet1/02/2020, 11:06 AM

## 2019-09-06 NOTE — Patient Instructions (Addendum)
Medication Instructions:  Your physician has recommended you make the following change in your medication:   CHANGE Lasix to 20mg  (one tablet) daily and an additional tablet as needed for weight gain of 3lb overnight or 4lbs in 1 week  *If you need a refill on your cardiac medications before your next appointment, please call your pharmacy*  Lab Work: Your physician recommends that you return for lab work in: BMET, BNP  If you have labs (blood work) drawn today and your tests are completely normal, you will receive your results only by: Marland Kitchen MyChart Message (if you have MyChart) OR . A paper copy in the mail If you have any lab test that is abnormal or we need to change your treatment, we will call you to review the results.  Testing/Procedures: You had an EKG today.   Follow-Up: At [facility], you and your health needs are our priority.  As part of our continuing mission to provide you with exceptional heart care, we have created designated Provider Care Teams.  These Care Teams include your primary Cardiologist (physician) and Advanced Practice Providers (APPs -  Physician Assistants and Nurse Practitioners) who all work together to provide you with the care you need, when you need it.  Your next appointment:   3 month(s)  The format for your next appointment:   In Person  Provider:    You may see Ida Rogue, MD or one of the following Advanced Practice Providers on your designated Care Team:    Murray Hodgkins, NP  Christell Faith, PA-C  Marrianne Mood, PA-C   Other Instructions  For your COVID vaccine - In [place] people 29 years and older can call [phone] starting at 8 AM on Friday. You also should have received a message to your MyChart regarding how to schedule.   For questions, residents can also call [facility] hotline at [phone].  Appointments: [phone] at the [facility] Department

## 2019-09-07 ENCOUNTER — Telehealth: Payer: Self-pay | Admitting: *Deleted

## 2019-09-07 DIAGNOSIS — I5042 Chronic combined systolic (congestive) and diastolic (congestive) heart failure: Secondary | ICD-10-CM

## 2019-09-07 DIAGNOSIS — Z79899 Other long term (current) drug therapy: Secondary | ICD-10-CM

## 2019-09-07 NOTE — Telephone Encounter (Signed)
-----   Message from Loel Dubonnet, NP sent at 09/07/2019  7:47 AM EST ----- BNP is stable compared to previous. Slight bump in kidney function, likely due to recent increased Lasix dose. Would like her to hold Lasix for 2 days then resume 20mg  daily. Please recheck BMET in 1-2 weeks.

## 2019-09-07 NOTE — Telephone Encounter (Signed)
Called patient back and she verbalized understanding to go to [place] [facility] in [place] for BMET in 1-2 weeks. Order has been placed.

## 2019-09-07 NOTE — Telephone Encounter (Signed)
Results called to pt. Pt verbalized understanding. Patient verbalized understanding to not take furosemide for 2 days, then resume 20 mg daily on Sunday. She asked if she could get her lab work in [employer]. This is because she has to get transportation and it would be more convenient for her rides.  I called [facility]. There is location at [place] in [place]. I can place the order into Epic and patient can go there anytime between 8a-5pm M-F for the lab work.

## 2019-09-20 ENCOUNTER — Other Ambulatory Visit: Payer: Self-pay

## 2019-09-20 ENCOUNTER — Other Ambulatory Visit
Admission: RE | Admit: 2019-09-20 | Discharge: 2019-09-20 | Disposition: A | Discharge status: Home / Self Care | Payer: Medicare Other | Attending: Family | Admitting: Family

## 2019-09-20 DIAGNOSIS — Z79899 Other long term (current) drug therapy: Secondary | ICD-10-CM

## 2019-09-20 DIAGNOSIS — J449 Chronic obstructive pulmonary disease, unspecified: Secondary | ICD-10-CM | POA: Insufficient documentation

## 2019-09-20 DIAGNOSIS — I5042 Chronic combined systolic (congestive) and diastolic (congestive) heart failure: Secondary | ICD-10-CM

## 2019-09-20 LAB — BASIC METABOLIC PANEL
Anion gap: 11 (ref 5–15)
BUN: 20 mg/dL (ref 8–23)
CO2: 25 mmol/L (ref 22–32)
Calcium: 8.9 mg/dL (ref 8.9–10.3)
Chloride: 99 mmol/L (ref 98–111)
Creatinine, Ser: 1.16 mg/dL — ABNORMAL HIGH (ref 0.44–1.00)
GFR calc Af Amer: 51 mL/min — ABNORMAL LOW (ref >60)
GFR calc non Af Amer: 44 mL/min — ABNORMAL LOW (ref >60)
Glucose, Bld: 106 mg/dL — ABNORMAL HIGH (ref 70–99)
Potassium: 4.3 mmol/L (ref 3.5–5.1)
Sodium: 135 mmol/L (ref 135–145)

## 2019-09-20 NOTE — Addendum Note (Signed)
Addended by: Tora Duck F on: 09/20/2019 10:38 AM   Modules accepted: Orders

## 2019-09-26 ENCOUNTER — Encounter: Payer: Self-pay | Admitting: Internal Medicine

## 2019-09-27 NOTE — Telephone Encounter (Signed)
Please advise pt message?

## 2019-09-27 NOTE — Telephone Encounter (Signed)
Patient response to your msg?

## 2019-09-28 ENCOUNTER — Other Ambulatory Visit: Payer: Self-pay | Admitting: Internal Medicine

## 2019-09-28 ENCOUNTER — Ambulatory Visit (INDEPENDENT_AMBULATORY_CARE_PROVIDER_SITE_OTHER): Payer: Medicare Other | Admitting: Internal Medicine

## 2019-09-28 ENCOUNTER — Encounter: Payer: Self-pay | Admitting: Internal Medicine

## 2019-09-28 ENCOUNTER — Other Ambulatory Visit: Payer: Self-pay

## 2019-09-28 VITALS — BP 126/64 | HR 60 | Temp 98.3°F | Ht 66.0 in | Wt 146.0 lb

## 2019-09-28 DIAGNOSIS — I25709 Atherosclerosis of coronary artery bypass graft(s), unspecified, with unspecified angina pectoris: Secondary | ICD-10-CM

## 2019-09-28 DIAGNOSIS — R6883 Chills (without fever): Secondary | ICD-10-CM | POA: Diagnosis not present

## 2019-09-28 DIAGNOSIS — E118 Type 2 diabetes mellitus with unspecified complications: Secondary | ICD-10-CM | POA: Diagnosis not present

## 2019-09-28 DIAGNOSIS — E039 Hypothyroidism, unspecified: Secondary | ICD-10-CM | POA: Diagnosis not present

## 2019-09-28 DIAGNOSIS — N3000 Acute cystitis without hematuria: Secondary | ICD-10-CM

## 2019-09-28 LAB — POC URINALYSIS WITH MICROSCOPIC (NON AUTO)MANUAL RESULT
Bilirubin, UA: NEGATIVE
Blood, UA: NEGATIVE
Crystals: 0
Glucose, UA: NEGATIVE
Ketones, UA: NEGATIVE
Mucus, UA: 0
Nitrite, UA: NEGATIVE
Protein, UA: NEGATIVE
RBC: 5 M/uL (ref 4.04–5.48)
Spec Grav, UA: <=1.005 — AB (ref 1.010–1.025)
Urobilinogen, UA: 0.2 E.U./dL
WBC Casts, UA: 10
pH, UA: 6.5 (ref 5.0–8.0)

## 2019-09-28 MED ORDER — CEFDINIR 300 MG PO CAPS: 300.0000 mg | ORAL_CAPSULE | Freq: Two times a day (BID) | ORAL | 0 refills | Status: AC

## 2019-09-28 NOTE — Progress Notes (Signed)
Date:  09/28/2019   Name:  Jenny Cooper   DOB:  08-17-1936   MRN:  [id number]   Chief Complaint: Urinary Tract Infection and Diabetes (A1C, Foot Exam. MICRO.)  Urinary Tract Infection  This is a new problem. The current episode started in the past 7 days. The problem has been unchanged. There has been no fever (but chills every day). Associated symptoms include chills. Pertinent negatives include no frequency.  Diabetes She presents for her follow-up diabetic visit. She has type 2 diabetes mellitus. Her disease course has been stable. Pertinent negatives for hypoglycemia include no dizziness or headaches. Pertinent negatives for diabetes include no chest pain. Current diabetic treatment includes diet.  COPD with hx lung cancer - She was recently treated with Doxycycline for COPD exacerbation by PUlmonary.  She still has yellow mucus and loose cough.  She is on Oxygen around the clock.  Due to Covid restrictions she has not had much direct medical interaction this past year.  Lab Results  Component Value Date   CREATININE 1.16 (H) 09/20/2019   BUN 20 09/20/2019   NA 135 09/20/2019   K 4.3 09/20/2019   CL 99 09/20/2019   CO2 25 09/20/2019   Lab Results  Component Value Date   CHOL 131 08/25/2017   HDL 36 (L) 08/25/2017   LDLCALC 63 08/25/2017   TRIG 159 (H) 08/25/2017   CHOLHDL 3.6 08/25/2017   Lab Results  Component Value Date   TSH 5.657 (H) 10/17/2018   Lab Results  Component Value Date   HGBA1C 5.2 03/01/2018     Review of Systems  Constitutional: Positive for chills and diaphoresis. Negative for appetite change, fever and unexpected weight change.  Respiratory: Positive for cough, chest tightness and shortness of breath. Negative for wheezing.   Cardiovascular: Negative for chest pain, palpitations and leg swelling.  Genitourinary: Negative for difficulty urinating, dysuria and frequency.       Strong odor to urine  Musculoskeletal: Positive for arthralgias,  back pain and gait problem.  Skin: Negative for rash.  Neurological: Negative for dizziness, light-headedness and headaches.    Patient Active Problem List   Diagnosis Date Noted  . Low vitamin B12 level 01/14/2019  . Pulmonary hypertension, unspecified ([place]) 12/01/2018  . Chronic respiratory failure with hypoxia ([place]) 12/01/2018  . Normocytic anemia 10/17/2018  . Acute on chronic respiratory failure with hypoxia ([place]) 08/21/2018  . Recurrent pleural effusion on right 07/11/2018  . Environmental and seasonal allergies 12/20/2017  . Muscle spasms of neck 12/20/2017  . Muscle ache 09/15/2017  . Coronary artery disease of bypass graft of native heart with stable angina pectoris ([place]) 04/21/2017  . Chronic combined systolic and diastolic CHF (congestive heart failure) ([place]) 04/14/2017  . Sinus node dysfunction ([place]) 04/06/2017  . Back pain of thoracolumbar region 03/15/2017  . PAF (paroxysmal atrial fibrillation) ([place]) 03/05/2017  . Falls frequently 03/05/2017  . Pleural effusion 12/23/2016  . Cancer of hilus of right lung ([place]) 12/14/2016  . Compression fracture of L2 lumbar vertebra ([place]) 08/31/2016  . Acute midline low back pain without sciatica 08/13/2016  . Fracture of multiple pubic rami, right, closed, initial encounter ([place]) 05/30/2016  . Cardiomyopathy ([place]) 04/29/2016  . Type II diabetes mellitus with complication ([place]) 23/53/6144  . Hx of adenomatous colonic polyps 11/17/2015  . Radiation pneumonitis ([place]) 10/21/2015  . Mitral regurgitation   . HLD (hyperlipidemia)   . Paroxysmal supraventricular tachycardia ([place])   . NSTEMI (non-ST elevated myocardial infarction) ([place])   .  Arteriosclerosis of coronary artery 01/11/2015  . Hypothyroidism (acquired) 01/11/2015  . Disorder of peripheral nervous system 10/04/2014  . Cervical radiculopathy, chronic 10/04/2014  . Lung cancer ([place]) 10/04/2014  . Atrial flutter ([place]) 11/16/2013  . Chronic obstructive pulmonary disease ([place])  04/24/2012  . Essential hypertension 08/03/2011  . Pacemaker 08/03/2011    Allergies  Allergen Reactions  . Lovenox [Enoxaparin Sodium] Itching    Past Surgical History:  Procedure Laterality Date  . ABLATION  04/2016   [facility]  . APPENDECTOMY    . CARDIAC CATHETERIZATION N/A 04/28/2015   Procedure: Left Heart Cath and Coronary Angiography;  Surgeon: Wellington Hampshire, MD;  Location: [facility];  Service: Cardiovascular;  Laterality: N/A;  . CARDIOVERSION N/A 04/08/2017   Procedure: CARDIOVERSION;  Surgeon: Wellington Hampshire, MD;  Location: [facility];  Service: Cardiovascular;  Laterality: N/A;  . CHEST TUBE INSERTION N/A 07/11/2018   Procedure: PLEURX CATH INSERTION;  Surgeon: [name], MD;  Location: [facility];  Service: Thoracic;  Laterality: N/A;  . COLONOSCOPY  12/2009   2 small tubular adenomas  . CORONARY ARTERY BYPASS GRAFT  09/2000  . DRAIN REMOVAL Right 08/10/2018   Procedure: DRAIN REMOVAL;  Surgeon: [name], MD;  Location: [facility];  Service: General;  Laterality: Right;  . ECTOPIC PREGNANCY SURGERY    . ELECTROMAGNETIC NAVIGATION BROCHOSCOPY N/A 10/06/2015   Procedure: ELECTROMAGNETIC NAVIGATION BRONCHOSCOPY;  Surgeon: Flora Lipps, MD;  Location: [facility];  Service: Cardiopulmonary;  Laterality: N/A;  . ENDOBRONCHIAL ULTRASOUND N/A 10/06/2015   Procedure: ENDOBRONCHIAL ULTRASOUND;  Surgeon: Flora Lipps, MD;  Location: [facility];  Service: Cardiopulmonary;  Laterality: N/A;  . FLEXIBLE BRONCHOSCOPY Right 07/11/2018   Procedure: FLEXIBLE BRONCHOSCOPY;  Surgeon: [name], MD;  Location: [facility];  Service: Thoracic;  Laterality: Right;  . KYPHOPLASTY N/A 09/28/2016   Procedure: KYPHOPLASTY;  Surgeon: Hessie Knows, MD;  Location: [facility];  Service: Orthopedics;  Laterality: N/A;  . KYPHOPLASTY N/A 02/20/2018   Procedure: [id number];  Surgeon: Hessie Knows, MD;  Location: [facility];  Service: Orthopedics;  Laterality: N/A;  . PACEMAKER INSERTION  03/2012  .  thorocentesis  12/24/2016  . VAGINAL HYSTERECTOMY     partial - left ovary remains  . VIDEO ASSISTED THORACOSCOPY Right 07/11/2018   Procedure: VIDEO ASSISTED THORACOSCOPY;  Surgeon: [name], MD;  Location: [facility];  Service: Thoracic;  Laterality: Right;  Marland Kitchen VIDEO ASSISTED THORACOSCOPY (VATS) W/TALC PLEUADESIS Right 07/11/2018   Procedure: THORACOTOMY PLEURAL BIOPSY WITH TALC PLEURODESIS;  Surgeon: [name], MD;  Location: [facility];  Service: Thoracic;  Laterality: Right;  pleural biopsy    Social History   Tobacco Use  . Smoking status: Former Smoker    Packs/day: 1.00    Years: 40.00    Pack years: 40.00    Types: Cigarettes    Quit date: 08/30/2000    Years since quitting: 19.0  . Smokeless tobacco: Never Used  . Tobacco comment: quit smoking in 08/28/2000. Smoking cessation materials not required  Substance Use Topics  . Alcohol use: Not Currently    Alcohol/week: 10.0 standard drinks    Types: 10 Glasses of wine per week    Comment: 2 glasses of wine per day  . Drug use: No     Medication list has been reviewed and updated.  Current Meds  Medication Sig  . acetaminophen (TYLENOL) 500 MG tablet Take 1,000 mg by mouth every 6 (six) hours as needed for mild pain or moderate pain.   Marland Kitchen  amiodarone (PACERONE) 200 MG tablet TAKE 1 TABLET BY MOUTH AS DIRECTED (2 TABLETS TWICE DAILY x 5 DAYS THEN 1 TABLET TWICE A DAY THEREAFTER (Patient taking differently: Take 200 mg by mouth daily. )  . apixaban (ELIQUIS) 5 MG TABS tablet Take 1 tablet (5 mg total) by mouth 2 (two) times daily.  . budesonide (PULMICORT) 0.5 MG/2ML nebulizer solution INHALE 2 MILLILITERS BY NEBULIZER 2 TIMES DAILY  . cetirizine (ZYRTEC) 10 MG tablet TAKE (1) TABLET BY MOUTH EVERY DAY (Patient taking differently: Take 10 mg by mouth daily. )  . ENTRESTO 24-26 MG TAKE (1) TABLET BY MOUTH TWICE DAILY  . furosemide (LASIX) 20 MG tablet Take 60m (one tablet) daily. May take an additional tablet as needed for  weight gain of 3lb overnight or 4lb in 1 week.  .Marland Kitchenipratropium-albuterol (DUONEB) 0.5-2.5 (3) MG/3ML SOLN TAKE 3 MLS BY NEBULIZATION EVERY 6 HOURSAS NEEDED FOR WHEEZING OR SHORTNESS OF BREATH  . levothyroxine (SYNTHROID) 88 MCG tablet TAKE (1) TABLET BY MOUTH EVERY DAY BEFORE BREAKFAST.  . metoprolol succinate (TOPROL-XL) 25 MG 24 hr tablet TAKE ONE TABLET BY MOUTH EVERY MORNING AND TAKE TWO TABLETS BY MOUTH EVERY EVENING  . Multiple Vitamins-Minerals (CENTRUM SILVER PO) Take 1 tablet by mouth daily.  . rosuvastatin (CRESTOR) 5 MG tablet TAKE ONE (1) TABLET BY MOUTH ONCE DAILY  . senna (SENOKOT) 8.6 MG TABS tablet Take 1 tablet (8.6 mg total) by mouth daily.  .Maretta Bees175 MCG/3ML SOLN Use one vial in nebulizer once daily.  Generic: revefenacin    PHQ 2/9 Scores 09/28/2019 05/30/2019 09/15/2017 08/25/2017  PHQ - 2 Score 0 0 0 0  PHQ- 9 Score - 0 - -    BP Readings from Last 3 Encounters:  09/28/19 126/64  09/06/19 124/70  08/17/19 110/82    Physical Exam Constitutional:      Appearance: Normal appearance.  HENT:     Right Ear: Tympanic membrane and ear canal normal.     Left Ear: Tympanic membrane and ear canal normal.     Nose:     Right Sinus: Frontal sinus tenderness present. No maxillary sinus tenderness.     Left Sinus: Frontal sinus tenderness present. No maxillary sinus tenderness.  Cardiovascular:     Rate and Rhythm: Normal rate and regular rhythm.     Pulses: Normal pulses.     Heart sounds: No murmur. No gallop.   Pulmonary:     Effort: Pulmonary effort is normal.     Breath sounds: Decreased air movement present. Decreased breath sounds present. No wheezing, rhonchi or rales.  Abdominal:     Palpations: Abdomen is soft.     Tenderness: There is no abdominal tenderness.  Musculoskeletal:     Cervical back: Normal range of motion.  Lymphadenopathy:     Cervical: No cervical adenopathy.  Neurological:     Mental Status: She is alert.  Psychiatric:        Attention  and Perception: Attention and perception normal.        Mood and Affect: Mood normal.     Wt Readings from Last 3 Encounters:  09/28/19 146 lb (66.2 kg)  09/06/19 156 lb 4 oz (70.9 kg)  08/17/19 158 lb (71.7 kg)    BP 126/64   Pulse 60   Temp 98.3 F (36.8 C) (Oral)   Ht _0  (1.676 m)   Wt 146 lb (66.2 kg)   SpO2 (!) 89%   BMI 23.57 kg/m   Assessment  and Plan: 1. Chills without fever Family worried that this was a vaccination reaction but the timing (one week later) does not make this likely Suspect she has ongoing upper respiratory infection - Will get CBC to help exclude serious infection - CBC with Differential/Platelet - cefdinir (OMNICEF) 300 MG capsule; Take 1 capsule (300 mg total) by mouth 2 (two) times daily for 10 days.  Dispense: 20 capsule; Refill: 0  2. Acute cystitis without hematuria Will send for culture to verify sensitivity to the chosen antibiotic - POCT Urinalysis Dipstick - Urine Culture - cefdinir (OMNICEF) 300 MG capsule; Take 1 capsule (300 mg total) by mouth 2 (two) times daily for 10 days.  Dispense: 20 capsule; Refill: 0  3. Type II diabetes mellitus with complication (HCC) Clinically stable by exam and report without s/s of hypoglycemia. Not on any medications.  Does not check BS at home.  Recent A1Cs have been normal. Will repeat labs and if normal, will consider diabetes resolved. - Microalbumin / creatinine urine ratio - Hemoglobin A1c  4. Hypothyroidism (acquired) Supplemented - no recent labs done so will obtain today - TSH + free T4   Partially dictated using Editor, commissioning. Any errors are unintentional.  Halina Maidens, MD [facility]  09/28/2019

## 2019-09-29 ENCOUNTER — Encounter: Payer: Self-pay | Admitting: Internal Medicine

## 2019-09-29 LAB — CBC WITH DIFFERENTIAL/PLATELET
Basophils Absolute: 0 10*3/uL (ref 0.0–0.2)
Basos: 0 %
EOS (ABSOLUTE): 0.1 10*3/uL (ref 0.0–0.4)
Eos: 3 %
Hematocrit: 34.4 % (ref 34.0–46.6)
Hemoglobin: 11.6 g/dL (ref 11.1–15.9)
Immature Grans (Abs): 0.1 10*3/uL (ref 0.0–0.1)
Immature Granulocytes: 1 %
Lymphocytes Absolute: 0.6 10*3/uL — ABNORMAL LOW (ref 0.7–3.1)
Lymphs: 11 %
MCH: 30.3 pg (ref 26.6–33.0)
MCHC: 33.7 g/dL (ref 31.5–35.7)
MCV: 90 fL (ref 79–97)
Monocytes Absolute: 0.8 10*3/uL (ref 0.1–0.9)
Monocytes: 15 %
Neutrophils Absolute: 3.9 10*3/uL (ref 1.4–7.0)
Neutrophils: 70 %
Platelets: 288 10*3/uL (ref 150–450)
RBC: 3.83 x10E6/uL (ref 3.77–5.28)
RDW: 13.9 % (ref 11.7–15.4)
WBC: 5.5 10*3/uL (ref 3.4–10.8)

## 2019-09-29 LAB — TSH+FREE T4
Free T4: 2.2 ng/dL — ABNORMAL HIGH (ref 0.82–1.77)
TSH: 6.95 u[IU]/mL — ABNORMAL HIGH (ref 0.450–4.500)

## 2019-09-29 LAB — HEMOGLOBIN A1C
Est. average glucose Bld gHb Est-mCnc: 111 mg/dL
Hgb A1c MFr Bld: 5.5 % (ref 4.8–5.6)

## 2019-10-01 ENCOUNTER — Other Ambulatory Visit: Payer: Self-pay | Admitting: Internal Medicine

## 2019-10-01 DIAGNOSIS — E039 Hypothyroidism, unspecified: Secondary | ICD-10-CM

## 2019-10-01 LAB — MICROALBUMIN / CREATININE URINE RATIO
Creatinine, Urine: 94.6 mg/dL
Microalb/Creat Ratio: 31 mg/g creat — ABNORMAL HIGH (ref 0–29)
Microalbumin, Urine: 29.4 ug/mL

## 2019-10-02 ENCOUNTER — Other Ambulatory Visit: Payer: Self-pay

## 2019-10-02 DIAGNOSIS — E039 Hypothyroidism, unspecified: Secondary | ICD-10-CM

## 2019-10-02 LAB — URINE CULTURE

## 2019-10-02 NOTE — Telephone Encounter (Signed)
Please advise patient message.

## 2019-10-03 LAB — TSH+T4F+T3FREE
Free T4: 2.24 ng/dL — ABNORMAL HIGH (ref 0.82–1.77)
T3, Free: 1.7 pg/mL — ABNORMAL LOW (ref 2.0–4.4)
TSH: 5.13 u[IU]/mL — ABNORMAL HIGH (ref 0.450–4.500)

## 2019-10-04 ENCOUNTER — Other Ambulatory Visit: Payer: Self-pay

## 2019-10-04 ENCOUNTER — Other Ambulatory Visit: Payer: Self-pay | Admitting: Internal Medicine

## 2019-10-04 ENCOUNTER — Other Ambulatory Visit: Payer: Self-pay | Admitting: Cardiovascular Disease

## 2019-10-04 DIAGNOSIS — C3401 Malignant neoplasm of right main bronchus: Secondary | ICD-10-CM

## 2019-10-04 DIAGNOSIS — E039 Hypothyroidism, unspecified: Secondary | ICD-10-CM

## 2019-10-15 ENCOUNTER — Other Ambulatory Visit: Payer: Self-pay

## 2019-10-15 ENCOUNTER — Ambulatory Visit
Admission: EM | Admit: 2019-10-15 | Discharge: 2019-10-15 | Disposition: A | Discharge status: Home / Self Care | Payer: Medicare Other | Attending: Family Medicine | Admitting: Family Medicine

## 2019-10-15 ENCOUNTER — Encounter: Payer: Self-pay | Admitting: Internal Medicine

## 2019-10-15 DIAGNOSIS — R21 Rash and other nonspecific skin eruption: Secondary | ICD-10-CM

## 2019-10-15 DIAGNOSIS — T7840XA Allergy, unspecified, initial encounter: Secondary | ICD-10-CM | POA: Diagnosis not present

## 2019-10-15 MED ORDER — PREDNISONE 20 MG PO TABS: ORAL_TABLET | ORAL | 0 refills | Status: DC

## 2019-10-15 NOTE — Discharge Instructions (Signed)
Over the counter antihistamine Follow up with Primary Care provider this week

## 2019-10-15 NOTE — ED Triage Notes (Signed)
Pt reports possible allergy to some laundry beads. Itching and hives started on 2/7. Seemed better after 2 days of Benadryl. Had COVID vaccine on Wednesday. Pt with redness all over her body and itching which has been going on all week to varying degrees. Facial puffiness started 2 days ago. Eyes are edematous. No trouble swallowing or breathing

## 2019-10-19 ENCOUNTER — Other Ambulatory Visit: Payer: Self-pay

## 2019-10-19 ENCOUNTER — Ambulatory Visit (INDEPENDENT_AMBULATORY_CARE_PROVIDER_SITE_OTHER): Payer: Medicare Other | Admitting: Internal Medicine

## 2019-10-19 ENCOUNTER — Encounter: Payer: Self-pay | Admitting: Internal Medicine

## 2019-10-19 VITALS — BP 126/68 | HR 65 | Ht 66.0 in | Wt 159.0 lb

## 2019-10-19 DIAGNOSIS — I25709 Atherosclerosis of coronary artery bypass graft(s), unspecified, with unspecified angina pectoris: Secondary | ICD-10-CM | POA: Diagnosis not present

## 2019-10-19 DIAGNOSIS — E039 Hypothyroidism, unspecified: Secondary | ICD-10-CM

## 2019-10-19 DIAGNOSIS — R21 Rash and other nonspecific skin eruption: Secondary | ICD-10-CM

## 2019-10-19 MED ORDER — LEVOTHYROXINE SODIUM 88 MCG PO TABS: 88.0000 ug | ORAL_TABLET | Freq: Every day | ORAL | 12 refills | Status: DC

## 2019-10-19 NOTE — Patient Instructions (Signed)
Finish prednisone  Can take benadryl at bedtime to help with rest and itching - try half dose 12.5 mg

## 2019-10-19 NOTE — Progress Notes (Signed)
Date:  10/19/2019   Name:  Jenny Cooper   DOB:  1936-02-05   MRN:  [id number]   Chief Complaint: Allergic Reaction (Seen [facility]- they think she had a reaction to the Covid vaccine. Much better on prednisone. Still taking this.)  Rash This is a new problem. Episode onset: 9 days ago. The problem has been gradually improving since onset. The rash is diffuse. The rash is characterized by itchiness, redness and peeling. Associated with: may have been from a clothes dryer additive; also had just finished a course of Omnicef for UTI/bronchitis. Associated symptoms include facial edema (most resolved) and shortness of breath (no new SOB). Pertinent negatives include no anorexia, diarrhea, fever or sore throat. Past treatments include oral steroids and antihistamine (near the end of a steroid taper; took benadryl but it was too sedating). The treatment provided moderate relief.  She also received her second covid vaccine 2 days after the rash started and was very fatigued the next day.  Immunization History  Administered Date(s) Administered  . Influenza, High Dose Seasonal PF 06/17/2014, 07/04/2018, 07/01/2019  . Influenza,inj,Quad PF,6+ Mos 07/01/2014, 05/28/2015, 05/31/2016, 04/21/2017  . Influenza-Unspecified 07/20/2011, 05/03/2012, 07/01/2014, 05/28/2015, 05/31/2016, 04/21/2017  . PFIZER SARS-COV-2 Vaccination 09/09/2019, 10/10/2019  . Pneumococcal Conjugate-13 04/21/2017  . Pneumococcal Polysaccharide-23 08/03/2011  . Zoster 05/01/2011    Lab Results  Component Value Date   CREATININE 1.16 (H) 09/20/2019   BUN 20 09/20/2019   NA 135 09/20/2019   K 4.3 09/20/2019   CL 99 09/20/2019   CO2 25 09/20/2019   Lab Results  Component Value Date   CHOL 131 08/25/2017   HDL 36 (L) 08/25/2017   LDLCALC 63 08/25/2017   TRIG 159 (H) 08/25/2017   CHOLHDL 3.6 08/25/2017   Lab Results  Component Value Date   TSH 5.130 (H) 10/02/2019   Lab Results  Component Value Date   HGBA1C 5.5  09/28/2019     Review of Systems  Constitutional: Negative for fever.  HENT: Negative for sore throat.   Respiratory: Positive for shortness of breath (no new SOB).   Gastrointestinal: Negative for anorexia and diarrhea.  Skin: Positive for rash (and itching).    Patient Active Problem List   Diagnosis Date Noted  . Low vitamin B12 level 01/14/2019  . Pulmonary hypertension, unspecified ([place]) 12/01/2018  . Chronic respiratory failure with hypoxia ([place]) 12/01/2018  . Normocytic anemia 10/17/2018  . Acute on chronic respiratory failure with hypoxia ([place]) 08/21/2018  . Recurrent pleural effusion on right 07/11/2018  . Environmental and seasonal allergies 12/20/2017  . Muscle spasms of neck 12/20/2017  . Muscle ache 09/15/2017  . Coronary artery disease of bypass graft of native heart with stable angina pectoris ([place]) 04/21/2017  . Chronic combined systolic and diastolic CHF (congestive heart failure) ([place]) 04/14/2017  . Sinus node dysfunction ([place]) 04/06/2017  . Back pain of thoracolumbar region 03/15/2017  . PAF (paroxysmal atrial fibrillation) ([place]) 03/05/2017  . Falls frequently 03/05/2017  . Pleural effusion 12/23/2016  . Cancer of hilus of right lung ([place]) 12/14/2016  . Compression fracture of L2 lumbar vertebra ([place]) 08/31/2016  . Acute midline low back pain without sciatica 08/13/2016  . Fracture of multiple pubic rami, right, closed, initial encounter ([place]) 05/30/2016  . Cardiomyopathy ([place]) 04/29/2016  . Hx of adenomatous colonic polyps 11/17/2015  . Radiation pneumonitis ([place]) 10/21/2015  . Mitral regurgitation   . HLD (hyperlipidemia)   . Paroxysmal supraventricular tachycardia ([place])   . NSTEMI (non-ST elevated myocardial  infarction) ([place])   . Arteriosclerosis of coronary artery 01/11/2015  . Hypothyroidism (acquired) 01/11/2015  . Disorder of peripheral nervous system 10/04/2014  . Cervical radiculopathy, chronic 10/04/2014  . Lung cancer ([place]) 10/04/2014  .  Atrial flutter ([place]) 11/16/2013  . Chronic obstructive pulmonary disease ([place]) 04/24/2012  . Essential hypertension 08/03/2011  . Pacemaker 08/03/2011    Allergies  Allergen Reactions  . Lovenox [Enoxaparin Sodium] Itching    Past Surgical History:  Procedure Laterality Date  . ABLATION  04/2016   [facility]  . APPENDECTOMY    . CARDIAC CATHETERIZATION N/A 04/28/2015   Procedure: Left Heart Cath and Coronary Angiography;  Surgeon: Wellington Hampshire, MD;  Location: [facility];  Service: Cardiovascular;  Laterality: N/A;  . CARDIOVERSION N/A 04/08/2017   Procedure: CARDIOVERSION;  Surgeon: Wellington Hampshire, MD;  Location: [facility];  Service: Cardiovascular;  Laterality: N/A;  . CHEST TUBE INSERTION N/A 07/11/2018   Procedure: PLEURX CATH INSERTION;  Surgeon: [name], MD;  Location: [facility];  Service: Thoracic;  Laterality: N/A;  . COLONOSCOPY  12/2009   2 small tubular adenomas  . CORONARY ARTERY BYPASS GRAFT  09/2000  . DRAIN REMOVAL Right 08/10/2018   Procedure: DRAIN REMOVAL;  Surgeon: [name], MD;  Location: [facility];  Service: General;  Laterality: Right;  . ECTOPIC PREGNANCY SURGERY    . ELECTROMAGNETIC NAVIGATION BROCHOSCOPY N/A 10/06/2015   Procedure: ELECTROMAGNETIC NAVIGATION BRONCHOSCOPY;  Surgeon: Flora Lipps, MD;  Location: [facility];  Service: Cardiopulmonary;  Laterality: N/A;  . ENDOBRONCHIAL ULTRASOUND N/A 10/06/2015   Procedure: ENDOBRONCHIAL ULTRASOUND;  Surgeon: Flora Lipps, MD;  Location: [facility];  Service: Cardiopulmonary;  Laterality: N/A;  . FLEXIBLE BRONCHOSCOPY Right 07/11/2018   Procedure: FLEXIBLE BRONCHOSCOPY;  Surgeon: [name], MD;  Location: [facility];  Service: Thoracic;  Laterality: Right;  . KYPHOPLASTY N/A 09/28/2016   Procedure: KYPHOPLASTY;  Surgeon: Hessie Knows, MD;  Location: [facility];  Service: Orthopedics;  Laterality: N/A;  . KYPHOPLASTY N/A 02/20/2018   Procedure: [id number];  Surgeon: Hessie Knows, MD;  Location: [facility];  Service: Orthopedics;  Laterality: N/A;  . PACEMAKER INSERTION  03/2012  . thorocentesis  12/24/2016  . VAGINAL HYSTERECTOMY     partial - left ovary remains  . VIDEO ASSISTED THORACOSCOPY Right 07/11/2018   Procedure: VIDEO ASSISTED THORACOSCOPY;  Surgeon: [name], MD;  Location: [facility];  Service: Thoracic;  Laterality: Right;  Marland Kitchen VIDEO ASSISTED THORACOSCOPY (VATS) W/TALC PLEUADESIS Right 07/11/2018   Procedure: THORACOTOMY PLEURAL BIOPSY WITH TALC PLEURODESIS;  Surgeon: [name], MD;  Location: [facility];  Service: Thoracic;  Laterality: Right;  pleural biopsy    Social History   Tobacco Use  . Smoking status: Former Smoker    Packs/day: 1.00    Years: 40.00    Pack years: 40.00    Types: Cigarettes    Quit date: 08/30/2000    Years since quitting: 19.1  . Smokeless tobacco: Never Used  . Tobacco comment: quit smoking in 08/28/2000. Smoking cessation materials not required  Substance Use Topics  . Alcohol use: Not Currently    Alcohol/week: 10.0 standard drinks    Types: 10 Glasses of wine per week    Comment: 2 glasses of wine per day  . Drug use: No     Medication list has been reviewed and updated.  Current Meds  Medication Sig  . acetaminophen (TYLENOL) 500 MG tablet Take 1,000 mg by mouth every 6 (six) hours as needed for mild pain or  moderate pain.   Marland Kitchen amiodarone (PACERONE) 200 MG tablet TAKE 1 TABLET BY MOUTH AS DIRECTED (2 TABLETS TWICE DAILY x 5 DAYS THEN 1 TABLET TWICE A DAY THEREAFTER (Patient taking differently: Take 200 mg by mouth daily. )  . budesonide (PULMICORT) 0.5 MG/2ML nebulizer solution INHALE 2 MILLILITERS BY NEBULIZER 2 TIMES DAILY  . cetirizine (ZYRTEC) 10 MG tablet TAKE (1) TABLET BY MOUTH EVERY DAY (Patient taking differently: Take 10 mg by mouth daily. )  . ELIQUIS 5 MG TABS tablet TAKE (1) TABLET BY MOUTH TWICE DAILY  . ENTRESTO 24-26 MG TAKE (1) TABLET BY MOUTH TWICE DAILY  . furosemide (LASIX) 20 MG tablet Take 32m (one tablet)  daily. May take an additional tablet as needed for weight gain of 3lb overnight or 4lb in 1 week.  .Marland Kitchenipratropium-albuterol (DUONEB) 0.5-2.5 (3) MG/3ML SOLN TAKE 3 MLS BY NEBULIZATION EVERY 6 HOURSAS NEEDED FOR WHEEZING OR SHORTNESS OF BREATH  . levothyroxine (SYNTHROID) 88 MCG tablet TAKE (1) TABLET BY MOUTH EVERY DAY BEFORE BREAKFAST  . metoprolol succinate (TOPROL-XL) 25 MG 24 hr tablet TAKE ONE TABLET BY MOUTH EVERY MORNING AND TAKE TWO TABLETS BY MOUTH EVERY EVENING  . Multiple Vitamins-Minerals (CENTRUM SILVER PO) Take 1 tablet by mouth daily.  . OXYGEN Inhale 2-4 L into the lungs.  . predniSONE (DELTASONE) 20 MG tablet 3 tabs po qd x 2 days, then 2 tabs po qd x 3 days, then 1 tab po qd x 3 days, then half a tab po qd x 2 days  . rosuvastatin (CRESTOR) 5 MG tablet TAKE ONE (1) TABLET BY MOUTH ONCE DAILY  . senna (SENOKOT) 8.6 MG TABS tablet Take 1 tablet (8.6 mg total) by mouth daily.  .Maretta Bees175 MCG/3ML SOLN Use one vial in nebulizer once daily.  Generic: revefenacin    PHQ 2/9 Scores 10/19/2019 09/28/2019 05/30/2019 09/15/2017  PHQ - 2 Score 0 0 0 0  PHQ- 9 Score - - 0 -    BP Readings from Last 3 Encounters:  10/19/19 126/68  10/15/19 106/75  09/28/19 126/64    Physical Exam Vitals reviewed.  Constitutional:      General: She is not in acute distress. Cardiovascular:     Rate and Rhythm: Normal rate and regular rhythm.  Pulmonary:     Effort: Pulmonary effort is normal.     Breath sounds: Wheezing (right upper lobe) present. No rhonchi.  Musculoskeletal:     Right lower leg: No edema.     Left lower leg: No edema.  Lymphadenopathy:     Cervical: No cervical adenopathy.  Skin:    General: Skin is warm.     Capillary Refill: Capillary refill takes less than 2 seconds.     Coloration: Pallor: ove face, chest, arms, back and legs.     Findings: Erythema present. Rash is not crusting or scaling.     Comments: No urticaria - general redness over legs and arms; splotchy  redness of forehead, nose and cheeks   Neurological:     General: No focal deficit present.     Mental Status: She is alert.     Comments: Sitting in [place] O2 nasal canula in place     Wt Readings from Last 3 Encounters:  10/19/19 159 lb (72.1 kg)  10/15/19 150 lb (68 kg)  09/28/19 146 lb (66.2 kg)    BP 126/68   Pulse 65   Ht _0  (1.676 m)   Wt 159 lb (72.1 kg)  SpO2 (!) 84%   BMI 25.66 kg/m   Assessment and Plan: 1. Rash and nonspecific skin eruption Uncertain trigger - could have been the antibiotic or the clothes dryer additive (not the Covid vaccine since that was given 2 days after the hives/rash started) Will list Omnicef as an allergy Finish steroid taper May take low dose of benadryl at bedtime to help with itching Continue to use moisturizing lotion PRN  2. Hypothyroidism (acquired) - levothyroxine (SYNTHROID) 88 MCG tablet; Take 1 tablet (88 mcg total) by mouth daily before breakfast.  Dispense: 30 tablet; Refill: 12   Partially dictated using Editor, commissioning. Any errors are unintentional.  Halina Maidens, MD [facility]  10/19/2019

## 2019-10-20 NOTE — ED Provider Notes (Signed)
[facility]    CSN: [id number] Arrival date & time: 10/15/19  1851      History   Chief Complaint Chief Complaint  Patient presents with  . Allergic Reaction    HPI Jenny Cooper is a 84 y.o. female.   84 yo female with a c/o rash all over her body for the past 2-3 days. States that about 1 week ago she had some hives which resolved with benadryl and presumably were due to exposure to some laundry beads. Then 2-3 days ago, after receiving the COVID vaccine as well as after finishing a 10 day antibiotic course of omnicef, she developed this new rash with redness all over her body and some mild swelling of her eyelids. Denies any trouble breathing, wheezing, chest pains, shortness of breath, trouble swallowing, pain.    Allergic Reaction   Past Medical History:  Diagnosis Date  . Acute respiratory failure with hypoxia ([place]) 12/11/2017  . Allergy   . Atypical atrial flutter ([place])    a. s/p ablation 07/27/2013 followed by Dr. Rockey Situ  . Balance problem   . CAD (coronary artery disease)    a. s/p MI x 2 in 2002 s/p PCI x 2 in 2002; b. s/p 2v CABG 2002; c. stress echo 07/2004 w/ evi of pos & inf infarct & no evi of ischemia; d. 4/08 dipyridamole scan w/ multiple areas of infarct, no ischemia, EF 49%; e. cath 04/28/15 3v CAD, med Rx rec, no targets for revasc, LM lum irregs, pLAD 30%, 100%, ost-pLCx 60%, [name] 99%, OM2 100%, p-mRCA 90%, [name] 100% L-R collats, VG-mLAD irregs, VG-OM2 oc  . Carcinoma of right lung ([place]) 01/03/2015   a. followed by Dr. Oliva Bustard  . Chronic systolic CHF (congestive heart failure) ([place])    a. echo 03/2015: EF 30-35%, sev ant/inf/pos HK, in mild to mod MR  . Complication of anesthesia    more recently patient oxygen levels do not rebound as quickly  . Compressed spine fracture ([place]) 08/06/2016   lumbar 2, t11, t12  . COPD (chronic obstructive pulmonary disease) ([place])   . Fractured pelvis ([place]) 05/26/2016   2 places  . GERD (gastroesophageal  reflux disease)   . History of blood clots    12/2001 left leg  . History of colonoscopy 2013  . History of mammography, screening 2015  . History of Papanicolaou smear of cervix 2013  . HLD (hyperlipidemia)   . HTN (hypertension)   . Hypothyroidism   . Lung cancer ([place])   . Mitral regurgitation    a. s/p mitral ring placement 09/2000; b. echo 09/2010: EF 50%, inf HK, post AK, mild MR, prosthetic mitral valve ring w/ peak gradient of 10 mmHg; b. echo 2/13: EF 50%, mild MR/TR     . Myocardial infarction ([place])    X 2 (LAST ONE IN 2002)  . Neuropathy   . Pacemaker    a. MDT 2002; b. generator replacement 2013; c. followed by Dr. Omelia Blackwater, MD  . PAF (paroxysmal atrial fibrillation) ([place])    a. on Eliquis   . Personal history of chemotherapy   . Personal history of radiation therapy   . Pneumonia   . Type II diabetes mellitus with complication ([place]) 1/96/2229    Patient Active Problem List   Diagnosis Date Noted  . Low vitamin B12 level 01/14/2019  . Pulmonary hypertension, unspecified ([place]) 12/01/2018  . Chronic respiratory failure with hypoxia ([place]) 12/01/2018  . Normocytic anemia 10/17/2018  . Acute on chronic  respiratory failure with hypoxia ([place]) 08/21/2018  . Recurrent pleural effusion on right 07/11/2018  . Environmental and seasonal allergies 12/20/2017  . Muscle spasms of neck 12/20/2017  . Muscle ache 09/15/2017  . Coronary artery disease of bypass graft of native heart with stable angina pectoris ([place]) 04/21/2017  . Chronic combined systolic and diastolic CHF (congestive heart failure) ([place]) 04/14/2017  . Sinus node dysfunction ([place]) 04/06/2017  . Back pain of thoracolumbar region 03/15/2017  . PAF (paroxysmal atrial fibrillation) ([place]) 03/05/2017  . Falls frequently 03/05/2017  . Pleural effusion 12/23/2016  . Cancer of hilus of right lung ([place]) 12/14/2016  . Compression fracture of L2 lumbar vertebra ([place]) 08/31/2016  . Acute midline low back pain without sciatica  08/13/2016  . Fracture of multiple pubic rami, right, closed, initial encounter ([place]) 05/30/2016  . Cardiomyopathy ([place]) 04/29/2016  . Hx of adenomatous colonic polyps 11/17/2015  . Radiation pneumonitis ([place]) 10/21/2015  . Mitral regurgitation   . HLD (hyperlipidemia)   . Paroxysmal supraventricular tachycardia ([place])   . NSTEMI (non-ST elevated myocardial infarction) ([place])   . Arteriosclerosis of coronary artery 01/11/2015  . Hypothyroidism (acquired) 01/11/2015  . Disorder of peripheral nervous system 10/04/2014  . Cervical radiculopathy, chronic 10/04/2014  . Lung cancer ([place]) 10/04/2014  . Atrial flutter ([place]) 11/16/2013  . Chronic obstructive pulmonary disease ([place]) 04/24/2012  . Essential hypertension 08/03/2011  . Pacemaker 08/03/2011    Past Surgical History:  Procedure Laterality Date  . ABLATION  04/2016   [facility]  . APPENDECTOMY    . CARDIAC CATHETERIZATION N/A 04/28/2015   Procedure: Left Heart Cath and Coronary Angiography;  Surgeon: Wellington Hampshire, MD;  Location: [facility];  Service: Cardiovascular;  Laterality: N/A;  . CARDIOVERSION N/A 04/08/2017   Procedure: CARDIOVERSION;  Surgeon: Wellington Hampshire, MD;  Location: [facility];  Service: Cardiovascular;  Laterality: N/A;  . CHEST TUBE INSERTION N/A 07/11/2018   Procedure: PLEURX CATH INSERTION;  Surgeon: [name], MD;  Location: [facility];  Service: Thoracic;  Laterality: N/A;  . COLONOSCOPY  12/2009   2 small tubular adenomas  . CORONARY ARTERY BYPASS GRAFT  09/2000  . DRAIN REMOVAL Right 08/10/2018   Procedure: DRAIN REMOVAL;  Surgeon: [name], MD;  Location: [facility];  Service: General;  Laterality: Right;  . ECTOPIC PREGNANCY SURGERY    . ELECTROMAGNETIC NAVIGATION BROCHOSCOPY N/A 10/06/2015   Procedure: ELECTROMAGNETIC NAVIGATION BRONCHOSCOPY;  Surgeon: Flora Lipps, MD;  Location: [facility];  Service: Cardiopulmonary;  Laterality: N/A;  . ENDOBRONCHIAL ULTRASOUND N/A 10/06/2015   Procedure:  ENDOBRONCHIAL ULTRASOUND;  Surgeon: Flora Lipps, MD;  Location: [facility];  Service: Cardiopulmonary;  Laterality: N/A;  . FLEXIBLE BRONCHOSCOPY Right 07/11/2018   Procedure: FLEXIBLE BRONCHOSCOPY;  Surgeon: [name], MD;  Location: [facility];  Service: Thoracic;  Laterality: Right;  . KYPHOPLASTY N/A 09/28/2016   Procedure: KYPHOPLASTY;  Surgeon: Hessie Knows, MD;  Location: [facility];  Service: Orthopedics;  Laterality: N/A;  . KYPHOPLASTY N/A 02/20/2018   Procedure: [id number];  Surgeon: Hessie Knows, MD;  Location: [facility];  Service: Orthopedics;  Laterality: N/A;  . PACEMAKER INSERTION  03/2012  . thorocentesis  12/24/2016  . VAGINAL HYSTERECTOMY     partial - left ovary remains  . VIDEO ASSISTED THORACOSCOPY Right 07/11/2018   Procedure: VIDEO ASSISTED THORACOSCOPY;  Surgeon: [name], MD;  Location: [facility];  Service: Thoracic;  Laterality: Right;  Mar[place] Kitchen VIDEO ASSISTED THORACOSCOPY (VATS) W/TALC PLEUADESIS Right 07/11/2018   Procedure: THORACOTOMY PLEURAL BIOPSY WITH TALC  PLEURODESIS;  Surgeon: [name], MD;  Location: [facility];  Service: Thoracic;  Laterality: Right;  pleural biopsy    OB History   No obstetric history on file.      Home Medications    Prior to Admission medications   Medication Sig Start Date End Date Taking? Authorizing Provider  OXYGEN Inhale 2-4 L into the lungs.   Yes [provider]  acetaminophen (TYLENOL) 500 MG tablet Take 1,000 mg by mouth every 6 (six) hours as needed for mild pain or moderate pain.     [provider]  amiodarone (PACERONE) 200 MG tablet TAKE 1 TABLET BY MOUTH AS DIRECTED (2 TABLETS TWICE DAILY x 5 DAYS THEN 1 TABLET TWICE A DAY THEREAFTER Patient taking differently: Take 200 mg by mouth daily.  04/30/19   Minna Merritts, MD  budesonide (PULMICORT) 0.5 MG/2ML nebulizer solution INHALE 2 MILLILITERS BY NEBULIZER 2 TIMES DAILY 06/21/19   Flora Lipps, MD  cetirizine (ZYRTEC) 10 MG tablet TAKE (1)  TABLET BY MOUTH EVERY DAY Patient taking differently: Take 10 mg by mouth daily.  06/15/18   Glean Hess, MD  ELIQUIS 5 MG TABS tablet TAKE (1) TABLET BY MOUTH TWICE DAILY 10/04/19   Gollan, Kathlene November, MD  ENTRESTO 24-26 MG TAKE (1) TABLET BY MOUTH TWICE DAILY 10/04/19   Minna Merritts, MD  furosemide (LASIX) 20 MG tablet Take 65m (one tablet) daily. May take an additional tablet as needed for weight gain of 3lb overnight or 4lb in 1 week. 09/06/19   WLoel Dubonnet NP  ipratropium-albuterol (DUONEB) 0.5-2.5 (3) MG/3ML SOLN TAKE 3 MLS BY NEBULIZATION EVERY 6 HOURSAS NEEDED FOR WHEEZING OR SHORTNESS OF BREATH 02/08/19   KFlora Lipps MD  levothyroxine (SYNTHROID) 88 MCG tablet Take 1 tablet (88 mcg total) by mouth daily before breakfast. 10/19/19   BGlean Hess MD  metoprolol succinate (TOPROL-XL) 25 MG 24 hr tablet TAKE ONE TABLET BY MOUTH EVERY MORNING AND TAKE TWO TABLETS BY MOUTH EVERY EVENING 12/26/18   GMinna Merritts MD  Multiple Vitamins-Minerals (CENTRUM SILVER PO) Take 1 tablet by mouth daily.    [provider]  predniSONE (DELTASONE) 20 MG tablet 3 tabs po qd x 2 days, then 2 tabs po qd x 3 days, then 1 tab po qd x 3 days, then half a tab po qd x 2 days 10/15/19   CNorval Gable MD  rosuvastatin (CRESTOR) 5 MG tablet TAKE ONE (1) TABLET BY MOUTH ONCE DAILY 08/30/19   GMinna Merritts MD  senna (SENOKOT) 8.6 MG TABS tablet Take 1 tablet (8.6 mg total) by mouth daily. 05/27/16   VRudene Re MD  YUPELRI 175 MCG/3ML SOLN Use one vial in nebulizer once daily.  Generic: revefenacin 04/19/19   KFlora Lipps MD    Family History Family History  Problem Relation Age of Onset  . COPD Mother        sister, and brother  . Lung disease Father   . Stroke Maternal Grandmother   . Hypertension Sister   . COPD Sister   . Hypertension Brother   . COPD Brother   . Colon cancer Brother   . Heart attack Neg Hx   . Breast cancer Neg Hx     Social History Social  History   Tobacco Use  . Smoking status: Former Smoker    Packs/day: 1.00    Years: 40.00    Pack years: 40.00    Types: Cigarettes  Quit date: 08/30/2000    Years since quitting: 19.1  . Smokeless tobacco: Never Used  . Tobacco comment: quit smoking in 08/28/2000. Smoking cessation materials not required  Substance Use Topics  . Alcohol use: Not Currently    Alcohol/week: 10.0 standard drinks    Types: 10 Glasses of wine per week    Comment: 2 glasses of wine per day  . Drug use: No     Allergies   Cefdinir and Lovenox [enoxaparin sodium]   Review of Systems Review of Systems   Physical Exam Triage Vital Signs ED Triage Vitals  Enc Vitals Group     BP 10/15/19 1901 106/75     Pulse Rate 10/15/19 1901 61     Resp 10/15/19 1901 17     Temp 10/15/19 1901 98 F (36.7 C)     Temp Source 10/15/19 1901 Oral     SpO2 10/15/19 1901 99 %     Weight 10/15/19 1902 150 lb (68 kg)     Height 10/15/19 1902 _0  (1.676 m)     Head Circumference --      Peak Flow --      Pain Score 10/15/19 1901 0     Pain Loc --      Pain Edu? --      Excl. in [place]? --    No data found.  Updated Vital Signs BP 106/75 (BP Location: Left Arm)   Pulse 61   Temp 98 F (36.7 C) (Oral)   Resp 17   Ht _1  (1.676 m)   Wt 68 kg   SpO2 99%   BMI 24.21 kg/m   Visual Acuity Right Eye Distance:   Left Eye Distance:   Bilateral Distance:    Right Eye Near:   Left Eye Near:    Bilateral Near:     Physical Exam Vitals and nursing note reviewed.  Constitutional:      General: She is not in acute distress.    Appearance: She is not toxic-appearing or diaphoretic.  HENT:     Mouth/Throat:     Pharynx: Oropharynx is clear. No oropharyngeal exudate or posterior oropharyngeal erythema.  Cardiovascular:     Rate and Rhythm: Normal rate.     Heart sounds: Normal heart sounds.  Pulmonary:     Effort: Pulmonary effort is normal. No respiratory distress.     Breath sounds: Normal breath  sounds. No stridor. No wheezing, rhonchi or rales.  Musculoskeletal:     Cervical back: Neck supple.  Skin:    Findings: Erythema and rash present. Rash is macular.     Comments: Diffuse, macular, erythematous, non-blanchable rash on trunk, extremities, and face  Neurological:     Mental Status: She is alert.      UC Treatments / Results  Labs (all labs ordered are listed, but only abnormal results are displayed) Labs Reviewed - No data to display  EKG   Radiology No results found.  Procedures Procedures (including critical care time)  Medications Ordered in UC Medications - No data to display  Initial Impression / Assessment and Plan / UC Course  I have reviewed the triage vital signs and the nursing notes.  Pertinent labs & imaging results that were available during my care of the patient were reviewed by me and considered in my medical decision making (see chart for details).      Final Clinical Impressions(s) / UC Diagnoses   Final diagnoses:  Allergic reaction, initial encounter  Rash     Discharge Instructions     Over the counter antihistamine Follow up with Primary Care provider this week    ED Prescriptions    Medication Sig Dispense Auth. Provider   predniSONE (DELTASONE) 20 MG tablet  (Status: Discontinued) 3 tabs po qd x 2 days, then 2 tabs po qd x 3 days, then 1 tab po qd x 3 days, then half a tab po qd x 2 days 16 tablet Wesson Stith, MD   predniSONE (DELTASONE) 20 MG tablet 3 tabs po qd x 2 days, then 2 tabs po qd x 3 days, then 1 tab po qd x 3 days, then half a tab po qd x 2 days 16 tablet Sawyer Kahan, MD      1.  diagnosis reviewed with patient 2. rx as per orders above; reviewed possible side effects, interactions, risks and benefits  3. Recommend supportive treatment as above 4. Follow-up prn if symptoms worsen or don't improve  PDMP not reviewed this encounter.   Norval Gable, MD 10/20/19 1225

## 2019-10-25 LAB — CBC AND DIFFERENTIAL: HCT: 34 — AB (ref 36–46)

## 2019-11-08 NOTE — Progress Notes (Signed)
Date:  11/09/2019   ID:  Jenny Cooper, DOB Mar 15, 1936, MRN [id number]  Patient Location:  [place] [address]   Provider location:   [facility], [employer] office  PCP:  Glean Hess, MD  Cardiologist:  Arvid [facility]  Chief Complaint  Patient presents with  . office visit    6 month F/U-Patient reports productive cough; Meds verbally reviewed with patient.     History of Present Illness:    Jenny Cooper is a 84 y.o. female  past medical history of CAD  s/p remote history of MI in 2002  2 vessel CABG post stenting in 2002,  mitral valve repair in 2002 secondary to mitral regurgitation,  PAF TEE/DCCV in 2013 for her PAF. atypical atrial flutter s/p ablation on 07/27/2013  atrial fibrillation and typical atrial flutter ablation on [facility]/11/2017at [facility] s/p MDT PPM,   COPD,  HTN,   HLD  [facility] on 04/01/15 with increased SOB, PNA, elevated troponin.  Quit smoking in 2001 carcinoma of right lung, has completed chemotherapy  falls, and chronic back pain from collapsed vertebrae Suffered pelvic fracture after fall in September 2017 She presents today to the clinic for follow-up of her atrial fibrillation and CAD  On today's visit she presents with her family Very Mild cough dec 2020 Worse: Cough 2-3 weeks Taking extra diuretic Clear sputum Sides hurt when coughing Significant sinus drainage, pressure  Hx of bronchitis Low blood pressure recently Weight is stable  CXR 07/2019 reviewed Stable areas of fibrosis and scarring with scarring primarily present in the right perihilar and posterior mid lung regions. There is also scarring in the right base with rather minimal right pleural Effusion.  followed by Dr. Mortimer Fries for pulmonary issues  Leg weakness chronic issue  Statin myalgia  EKG personally reviewed by myself on todays visit Shows normal sinus rhythm T wave abnormality anterolateral leads  Other past  medical history reviewed EP reviewed from February 2019 at [facility] documenting minimal atrial fibrillation Unable to review pacer download from May 2019 Compliant with her eliquis  On a prior clinic office she was in atrial flutter with rapid rate Started on amiodarone after discussion with [facility] EP She had worsening shortness of breath, presented to the emergency room Admitted to the hospital with Cardioversion, 04/08/17 Lasix IV for management of her CHF  Echocardiogram showing EF 30 to 35%, moderately elevated RVSP  Cardioversion at the end of May 2018 for atrial flutter Since that time she has felt relatively well, recent vacation in [place] the past week Reports that she did not feel right at times but unable to put her finger on close going on  CT scan September 2016 showing improvement of her lung cancer She finished her cancer treatment over the summer 2016, she had chemotherapy and radiation  TEE/DCCV in 2013 for her PAF. In 2014 she was diagnosed with atypical atrial flutter and underwent ablation. underwent PPM generator change in 2013.   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  finished concurrent chemoradiation with carboplatinum and Taxol and PET scan showed significant response.   Echo showed EF 30-35%, severe anterior and infero/posterior wall HK. Left ventricular function parameters were normal, mild to moderate MR. Left atrium was mildly dilated. RV systolic function was normal. Mild to moderate TR. PASP was moderately to severely elevated at 60 mm Hg.   outpatient cardiac cath  on 04/28/2015 that showed 3 vessel CAD with patent SVG to LAD, occluded SVG to OM, native RCA had severe ISR in the mid-segment and was occluded distally at the sites of previously placed stents. There were left to right collaterals. Moderately reduced LVSF with EF of 35-40%. Mildly elevated LVEDP. There were no good targets for  revascularization. medical therapy.      Prior CV studies:   The following studies were reviewed today:    Past Medical History:  Diagnosis Date  . Acute respiratory failure with hypoxia ([place]) 12/11/2017  . Allergy   . Atypical atrial flutter ([place])    a. s/p ablation 07/27/2013 followed by Dr. Rockey Situ  . Balance problem   . CAD (coronary artery disease)    a. s/p MI x 2 in 2002 s/p PCI x 2 in 2002; b. s/p 2v CABG 2002; c. stress echo 07/2004 w/ evi of pos & inf infarct & no evi of ischemia; d. 4/08 dipyridamole scan w/ multiple areas of infarct, no ischemia, EF 49%; e. cath 04/28/15 3v CAD, med Rx rec, no targets for revasc, LM lum irregs, pLAD 30%, 100%, ost-pLCx 60%, [name] 99%, OM2 100%, p-mRCA 90%, [name] 100% L-R collats, VG-mLAD irregs, VG-OM2 oc  . Carcinoma of right lung ([place]) 01/03/2015   a. followed by Dr. Oliva Bustard  . Chronic systolic CHF (congestive heart failure) ([place])    a. echo 03/2015: EF 30-35%, sev ant/inf/pos HK, in mild to mod MR  . Complication of anesthesia    more recently patient oxygen levels do not rebound as quickly  . Compressed spine fracture ([place]) 08/06/2016   lumbar 2, t11, t12  . COPD (chronic obstructive pulmonary disease) ([place])   . Fractured pelvis ([place]) 05/26/2016   2 places  . GERD (gastroesophageal reflux disease)   . History of blood clots    12/2001 left leg  . History of colonoscopy 2013  . History of mammography, screening 2015  . History of Papanicolaou smear of cervix 2013  . HLD (hyperlipidemia)   . HTN (hypertension)   . Hypothyroidism   . Lung cancer ([place])   . Mitral regurgitation    a. s/p mitral ring placement 09/2000; b. echo 09/2010: EF 50%, inf HK, post AK, mild MR, prosthetic mitral valve ring w/ peak gradient of 10 mmHg; b. echo 2/13: EF 50%, mild MR/TR     . Myocardial infarction ([place])    X 2 (LAST ONE IN 2002)  . Neuropathy   . Pacemaker    a. MDT 2002; b. generator replacement 2013; c. followed by Dr. Omelia Blackwater, MD  . PAF  (paroxysmal atrial fibrillation) ([place])    a. on Eliquis   . Personal history of chemotherapy   . Personal history of radiation therapy   . Pneumonia   . Type II diabetes mellitus with complication ([place]) 4/40/3474   Past Surgical History:  Procedure Laterality Date  . ABLATION  04/2016   [facility]  . APPENDECTOMY    . CARDIAC CATHETERIZATION N/A 04/28/2015   Procedure: Left Heart Cath and Coronary Angiography;  Surgeon: Wellington Hampshire, MD;  Location: [facility];  Service: Cardiovascular;  Laterality: N/A;  . CARDIOVERSION N/A 04/08/2017   Procedure: CARDIOVERSION;  Surgeon: Wellington Hampshire, MD;  Location: [facility];  Service: Cardiovascular;  Laterality: N/A;  . CHEST TUBE INSERTION N/A 07/11/2018   Procedure: QVZDGL CATH INSERTION;  Surgeon: [name], MD;  Location: [facility];  Service: Thoracic;  Laterality: N/A;  . COLONOSCOPY  12/2009  2 small tubular adenomas  . CORONARY ARTERY BYPASS GRAFT  09/2000  . DRAIN REMOVAL Right 08/10/2018   Procedure: DRAIN REMOVAL;  Surgeon: [name], MD;  Location: [facility];  Service: General;  Laterality: Right;  . ECTOPIC PREGNANCY SURGERY    . ELECTROMAGNETIC NAVIGATION BROCHOSCOPY N/A 10/06/2015   Procedure: ELECTROMAGNETIC NAVIGATION BRONCHOSCOPY;  Surgeon: Flora Lipps, MD;  Location: [facility];  Service: Cardiopulmonary;  Laterality: N/A;  . ENDOBRONCHIAL ULTRASOUND N/A 10/06/2015   Procedure: ENDOBRONCHIAL ULTRASOUND;  Surgeon: Flora Lipps, MD;  Location: [facility];  Service: Cardiopulmonary;  Laterality: N/A;  . FLEXIBLE BRONCHOSCOPY Right 07/11/2018   Procedure: FLEXIBLE BRONCHOSCOPY;  Surgeon: [name], MD;  Location: [facility];  Service: Thoracic;  Laterality: Right;  . KYPHOPLASTY N/A 09/28/2016   Procedure: KYPHOPLASTY;  Surgeon: Hessie Knows, MD;  Location: [facility];  Service: Orthopedics;  Laterality: N/A;  . KYPHOPLASTY N/A 02/20/2018   Procedure: [id number];  Surgeon: Hessie Knows, MD;  Location: [facility];  Service:  Orthopedics;  Laterality: N/A;  . PACEMAKER INSERTION  03/2012  . thorocentesis  12/24/2016  . VAGINAL HYSTERECTOMY     partial - left ovary remains  . VIDEO ASSISTED THORACOSCOPY Right 07/11/2018   Procedure: VIDEO ASSISTED THORACOSCOPY;  Surgeon: [name], MD;  Location: [facility];  Service: Thoracic;  Laterality: Right;  Marland Kitchen VIDEO ASSISTED THORACOSCOPY (VATS) W/TALC PLEUADESIS Right 07/11/2018   Procedure: THORACOTOMY PLEURAL BIOPSY WITH TALC PLEURODESIS;  Surgeon: [name], MD;  Location: [facility];  Service: Thoracic;  Laterality: Right;  pleural biopsy     Current Meds  Medication Sig  . acetaminophen (TYLENOL) 500 MG tablet Take 1,000 mg by mouth every 6 (six) hours as needed for mild pain or moderate pain.   Marland Kitchen amiodarone (PACERONE) 200 MG tablet TAKE 1 TABLET BY MOUTH AS DIRECTED (2 TABLETS TWICE DAILY x 5 DAYS THEN 1 TABLET TWICE A DAY THEREAFTER (Patient taking differently: Take 200 mg by mouth daily. )  . budesonide (PULMICORT) 0.5 MG/2ML nebulizer solution INHALE 2 MILLILITERS BY NEBULIZER 2 TIMES DAILY  . cetirizine (ZYRTEC) 10 MG tablet TAKE (1) TABLET BY MOUTH EVERY DAY (Patient taking differently: Take 10 mg by mouth daily. )  . ELIQUIS 5 MG TABS tablet TAKE (1) TABLET BY MOUTH TWICE DAILY  . ENTRESTO 24-26 MG TAKE (1) TABLET BY MOUTH TWICE DAILY  . furosemide (LASIX) 20 MG tablet Take 64m (one tablet) daily. May take an additional tablet as needed for weight gain of 3lb overnight or 4lb in 1 week.  .Marland Kitchenipratropium-albuterol (DUONEB) 0.5-2.5 (3) MG/3ML SOLN TAKE 3 MLS BY NEBULIZATION EVERY 6 HOURSAS NEEDED FOR WHEEZING OR SHORTNESS OF BREATH  . levothyroxine (SYNTHROID) 88 MCG tablet Take 1 tablet (88 mcg total) by mouth daily before breakfast.  . metoprolol succinate (TOPROL-XL) 25 MG 24 hr tablet TAKE ONE TABLET BY MOUTH EVERY MORNING AND TAKE TWO TABLETS BY MOUTH EVERY EVENING  . Multiple Vitamins-Minerals (CENTRUM SILVER PO) Take 1 tablet by mouth daily.  . OXYGEN  Inhale 2-4 L into the lungs.  . rosuvastatin (CRESTOR) 5 MG tablet TAKE ONE (1) TABLET BY MOUTH ONCE DAILY  . senna (SENOKOT) 8.6 MG TABS tablet Take 1 tablet (8.6 mg total) by mouth daily.  .Maretta Bees175 MCG/3ML SOLN Use one vial in nebulizer once daily.  Generic: revefenacin     Allergies:   Cefdinir and Lovenox [enoxaparin sodium]   Social History   Tobacco Use  . Smoking status: Former Smoker    Packs/day: 1.00  Years: 40.00    Pack years: 40.00    Types: Cigarettes    Quit date: 08/30/2000    Years since quitting: 19.2  . Smokeless tobacco: Never Used  . Tobacco comment: quit smoking in 08/28/2000. Smoking cessation materials not required  Substance Use Topics  . Alcohol use: Not Currently    Alcohol/week: 10.0 standard drinks    Types: 10 Glasses of wine per week    Comment: 2 glasses of wine per day  . Drug use: No     Family Hx: The patient's family history includes COPD in her brother, mother, and sister; Colon cancer in her brother; Hypertension in her brother and sister; Lung disease in her father; Stroke in her maternal grandmother. There is no history of Heart attack or Breast cancer.  ROS:   Please see the history of present illness.    Review of Systems  Constitutional: Negative.   Respiratory: Positive for shortness of breath.   Cardiovascular: Positive for palpitations.  Gastrointestinal: Negative.   Musculoskeletal: Negative.   Neurological: Negative.   Psychiatric/Behavioral: Negative.   All other systems reviewed and are negative.    Labs/Other Tests and Data Reviewed:    Recent Labs: 01/15/2019: ALT 15 09/06/2019: B Natriuretic Peptide 710.0 09/20/2019: BUN 20; Creatinine, Ser 1.16; Potassium 4.3; Sodium 135 09/28/2019: Hemoglobin 11.6; Platelets 288 10/02/2019: TSH 5.130   Recent Lipid Panel Lab Results  Component Value Date/Time   CHOL 131 08/25/2017 09:37 AM   TRIG 159 (H) 08/25/2017 09:37 AM   HDL 36 (L) 08/25/2017 09:37 AM   CHOLHDL 3.6  08/25/2017 09:37 AM   LDLCALC 63 08/25/2017 09:37 AM    Wt Readings from Last 3 Encounters:  11/09/19 148 lb 2 oz (67.2 kg)  10/19/19 159 lb (72.1 kg)  10/15/19 150 lb (68 kg)     Exam:    Vital Signs: Vital signs may also be detailed in the HPI BP (!) 98/50 (BP Location: Right Arm, Patient Position: Sitting, Cuff Size: Normal)   Pulse 75   Ht _0  (1.676 m)   Wt 148 lb 2 oz (67.2 kg)   SpO2 96% Comment: on 3L O2  BMI 23.91 kg/m   Constitutional:  oriented to person, place, and time. No distress.  HENT:  Head: Grossly normal Eyes:  no discharge. No scleral icterus.  Neck: No JVD, no carotid bruits  Cardiovascular: Regular rate and rhythm, no murmurs appreciated Pulmonary/Chest: Clear to auscultation bilaterally, no wheezes or rails Abdominal: Soft.  no distension.  no tenderness.  Musculoskeletal: Normal range of motion Neurological:  normal muscle tone. Coordination normal. No atrophy Skin: Skin warm and dry Psychiatric: normal affect, pleasant  ASSESSMENT & PLAN:    Chronic combined systolic and diastolic CHF (congestive heart failure) (HCC)  Typical atrial flutter (HCC)  PAF (paroxysmal atrial fibrillation) (HCC)  Essential hypertension  Coronary artery disease involving coronary bypass graft of native heart with angina pectoris (HCC)  Chronic obstructive pulmonary disease, unspecified COPD type ([place])  Pulmonary hypertension, unspecified ([place])  Chronic respiratory failure with hypoxia ([place])   Essential hypertension -  Blood pressure stable, no medication changes made  Paroxysmal atrial fibrillation (HCC) - Previous ablation at [facility] On anticoagulation Maintaining normal sinus rhythm on today's visit  Atrial flutter Followed by Dr. Glennon Mac, EP at [facility] Denies episodes of tachypalpitations On amiodarone, anticoagulation, with metoprolol  Hyperlipidemia - Cholesterol is at goal on the current lipid regimen. No changes to the medications were  made.  Cough Long discussion concerning  differential for her cough Lungs are clear on exam Less likely fluid retention given stable weight no clinical signs of heart failure Concern for postnasal drip and significant sinus congestion She is currently taking Zyrtec daily Recommend she try Flonase, humidified air Also recommended she call oxygen supplier and see if she can humidify her oxygen -We will need to monitor closely for bronchitis symptoms  Chronic systolic CHF (congestive heart failure) (HCC) Appears euvolemic, no changes to her medications  DM type 2, goal A1c below 7 Managed through diet restriction and she is unable to exercise  COPD exacerbation ([place]) Previously treated with prednisone Followed by pulmonary On chronic oxygen If symptoms of coughing persist may need antibiotic for bronchitis  Chronic back pain Management per orthopedics   Follow-up 6 months   Total encounter time more than 45 minutes  Greater than 50% was spent in counseling and coordination of care with the patient   Signed, Ida Rogue, MD  11/09/2019 2:25 PM    [place] Office [address] [place] [address], [place], [place] [address]

## 2019-11-09 ENCOUNTER — Ambulatory Visit (INDEPENDENT_AMBULATORY_CARE_PROVIDER_SITE_OTHER): Payer: Medicare Other | Admitting: Cardiovascular Disease

## 2019-11-09 ENCOUNTER — Other Ambulatory Visit: Payer: Self-pay

## 2019-11-09 ENCOUNTER — Encounter: Payer: Self-pay | Admitting: Cardiovascular Disease

## 2019-11-09 VITALS — BP 98/50 | HR 75 | Ht 66.0 in | Wt 148.1 lb

## 2019-11-09 DIAGNOSIS — J449 Chronic obstructive pulmonary disease, unspecified: Secondary | ICD-10-CM

## 2019-11-09 DIAGNOSIS — I10 Essential (primary) hypertension: Secondary | ICD-10-CM | POA: Diagnosis not present

## 2019-11-09 DIAGNOSIS — I5042 Chronic combined systolic (congestive) and diastolic (congestive) heart failure: Secondary | ICD-10-CM | POA: Diagnosis not present

## 2019-11-09 DIAGNOSIS — I483 Typical atrial flutter: Secondary | ICD-10-CM

## 2019-11-09 DIAGNOSIS — I25709 Atherosclerosis of coronary artery bypass graft(s), unspecified, with unspecified angina pectoris: Secondary | ICD-10-CM

## 2019-11-09 DIAGNOSIS — I272 Pulmonary hypertension, unspecified: Secondary | ICD-10-CM

## 2019-11-09 DIAGNOSIS — I480 Paroxysmal atrial fibrillation: Secondary | ICD-10-CM | POA: Diagnosis not present

## 2019-11-09 DIAGNOSIS — J9611 Chronic respiratory failure with hypoxia: Secondary | ICD-10-CM

## 2019-11-09 NOTE — Patient Instructions (Addendum)

## 2019-12-05 ENCOUNTER — Ambulatory Visit: Payer: Medicare Other | Admitting: Cardiovascular Disease

## 2019-12-17 ENCOUNTER — Encounter: Payer: Self-pay | Admitting: Cardiovascular Disease

## 2019-12-17 ENCOUNTER — Telehealth: Payer: Self-pay | Admitting: Cardiovascular Disease

## 2019-12-17 ENCOUNTER — Other Ambulatory Visit: Payer: Self-pay

## 2019-12-17 ENCOUNTER — Ambulatory Visit (INDEPENDENT_AMBULATORY_CARE_PROVIDER_SITE_OTHER): Payer: Medicare Other | Admitting: Cardiovascular Disease

## 2019-12-17 VITALS — BP 88/56 | HR 116 | Ht 66.0 in | Wt 150.2 lb

## 2019-12-17 DIAGNOSIS — I480 Paroxysmal atrial fibrillation: Secondary | ICD-10-CM | POA: Diagnosis not present

## 2019-12-17 DIAGNOSIS — I25709 Atherosclerosis of coronary artery bypass graft(s), unspecified, with unspecified angina pectoris: Secondary | ICD-10-CM

## 2019-12-17 MED ORDER — AMIODARONE HCL 200 MG PO TABS: 200.0000 mg | ORAL_TABLET | Freq: Two times a day (BID) | ORAL | 3 refills | Status: DC

## 2019-12-17 NOTE — Progress Notes (Signed)
Date:  12/17/2019   ID:  Jenny Cooper, DOB May 18, 1936, MRN [id number]  Patient Location:  [place] [address]   Provider location:   [facility], [place] office  PCP:  Glean Hess, MD  Cardiologist:  Arvid Right [facility]  Chief Complaint  Patient presents with  . office visit    Low BP. High HR. Meds verbally reviewed w/ pt.      History of Present Illness:    Jenny Cooper is a 84 y.o. female  past medical history of CAD  s/p remote history of MI in 2002  2 vessel CABG post stenting in 2002,  mitral valve repair in 2002 secondary to mitral regurgitation,  PAF TEE/DCCV in 2013 for her PAF. atypical atrial flutter s/p ablation on 07/27/2013  atrial fibrillation and typical atrial flutter ablation [facility]2017[facility] s/p MDT PPM,   COPD,  HTN,   HLD  [facility] on 04/01/15 with increased SOB, PNA, elevated troponin.  Quit smoking in 2001 carcinoma of right lung, has completed chemotherapy  falls, and chronic back pain from collapsed vertebrae Suffered pelvic fracture after fall in September 2017 She presents today to the clinic for follow-up of her atrial fibrillation and CAD  Reports that she woke up this morning with tachycardia, shortness of breath dizziness Blood pressure has run low through the morning Presents with her son, She has had difficulty ambulating since this morning secondary to dizziness shortness of breath Sitting she is fine, limited in her exertion capacity  Over the weekend felt fine Denies any recent bronchitis  She is followed by [facility]  EKG reviewed, irregular rhythm concerning for atrial fibrillation/flutter, V pacing 117 bpm variable rate Clear difference in EKG when compared to March 2021 when she was normal sinus rhythm narrow complex  She is hoping not to go to the emergency room, ideally would like to have it taken care of in [place] by EP. Discussed with her that they are not available  in [place] today  Other past medical history reviewed  CXR 07/2019  Stable areas of fibrosis and scarring with scarring primarily present in the right perihilar and posterior mid lung regions. There is also scarring in the right base with rather minimal right pleural Effusion.   On a prior clinic office she was in atrial flutter with rapid rate Started on amiodarone after discussion with [facility] She had worsening shortness of breath, presented to the emergency room Admitted to the hospital with Cardioversion, 04/08/17 Lasix IV for management of her CHF  Echocardiogram showing EF 30 to 35%, moderately elevated RVSP  Cardioversion at the end of May 2018 for atrial flutter Since that time she has felt relatively well, recent vacation in [place] the past week Reports that she did not feel right at times but unable to put her finger on close going on  CT scan September 2016 showing improvement of her lung cancer She finished her cancer treatment over the summer 2016, she had chemotherapy and radiation  TEE/DCCV in 2013 for her PAF. In 2014 she was diagnosed with atypical atrial flutter and underwent ablation. underwent PPM generator change in 2013.   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  finished concurrent chemoradiation with carboplatinum and Taxol and PET scan showed significant response.   Echo showed EF 30-35%, severe anterior and infero/posterior wall HK. Left ventricular function parameters were normal, mild to  moderate MR. Left atrium was mildly dilated. RV systolic function was normal. Mild to moderate TR. PASP was moderately to severely elevated at 60 mm Hg.   outpatient cardiac cath on 04/28/2015 that showed 3 vessel CAD with patent SVG to LAD, occluded SVG to OM, native RCA had severe ISR in the mid-segment and was occluded distally at the sites of previously placed stents. There were left to right collaterals.  Moderately reduced LVSF with EF of 35-40%. Mildly elevated LVEDP. There were no good targets for revascularization. medical therapy.    Prior CV studies:   The following studies were reviewed today:    Past Medical History:  Diagnosis Date  . Acute respiratory failure with hypoxia ([place]) 12/11/2017  . Allergy   . Atypical atrial flutter ([place])    a. s/p ablation 07/27/2013 followed by Dr. Rockey Situ  . Balance problem   . CAD (coronary artery disease)    a. s/p MI x 2 in 2002 s/p PCI x 2 in 2002; b. s/p 2v CABG 2002; c. stress echo 07/2004 w/ evi of pos & inf infarct & no evi of ischemia; d. 4/08 dipyridamole scan w/ multiple areas of infarct, no ischemia, EF 49%; e. cath 04/28/15 3v CAD, med Rx rec, no targets for revasc, LM lum irregs, pLAD 30%, 100%, ost-pLCx 60%, [name] 99%, OM2 100%, p-mRCA 90%, [name] 100% L-R collats, VG-mLAD irregs, VG-OM2 oc  . Carcinoma of right lung ([place]) 01/03/2015   a. followed by Dr. Oliva Bustard  . Chronic systolic CHF (congestive heart failure) ([place])    a. echo 03/2015: EF 30-35%, sev ant/inf/pos HK, in mild to mod MR  . Complication of anesthesia    more recently patient oxygen levels do not rebound as quickly  . Compressed spine fracture ([place]) 08/06/2016   lumbar 2, t11, t12  . COPD (chronic obstructive pulmonary disease) ([place])   . Fractured pelvis ([place]) 05/26/2016   2 places  . GERD (gastroesophageal reflux disease)   . History of blood clots    12/2001 left leg  . History of colonoscopy 2013  . History of mammography, screening 2015  . History of Papanicolaou smear of cervix 2013  . HLD (hyperlipidemia)   . HTN (hypertension)   . Hypothyroidism   . Lung cancer ([place])   . Mitral regurgitation    a. s/p mitral ring placement 09/2000; b. echo 09/2010: EF 50%, inf HK, post AK, mild MR, prosthetic mitral valve ring w/ peak gradient of 10 mmHg; b. echo 2/13: EF 50%, mild MR/TR     . Myocardial infarction ([place])    X 2 (LAST ONE IN 2002)  . Neuropathy   . Pacemaker     a. MDT 2002; b. generator replacement 2013; c. followed by Dr. Omelia Blackwater, MD  . PAF (paroxysmal atrial fibrillation) ([place])    a. on Eliquis   . Personal history of chemotherapy   . Personal history of radiation therapy   . Pneumonia   . Type II diabetes mellitus with complication ([place]) 04/07/9832   Past Surgical History:  Procedure Laterality Date  . ABLATION  04/2016   [facility]  . APPENDECTOMY    . CARDIAC CATHETERIZATION N/A 04/28/2015   Procedure: Left Heart Cath and Coronary Angiography;  Surgeon: Wellington Hampshire, MD;  Location: [facility];  Service: Cardiovascular;  Laterality: N/A;  . CARDIOVERSION N/A 04/08/2017   Procedure: CARDIOVERSION;  Surgeon: Wellington Hampshire, MD;  Location: [facility];  Service: Cardiovascular;  Laterality: N/A;  . CHEST TUBE  INSERTION N/A 07/11/2018   Procedure: KGMWNU CATH INSERTION;  Surgeon: [name], MD;  Location: [facility];  Service: Thoracic;  Laterality: N/A;  . COLONOSCOPY  12/2009   2 small tubular adenomas  . CORONARY ARTERY BYPASS GRAFT  09/2000  . DRAIN REMOVAL Right 08/10/2018   Procedure: DRAIN REMOVAL;  Surgeon: [name], MD;  Location: [facility];  Service: General;  Laterality: Right;  . ECTOPIC PREGNANCY SURGERY    . ELECTROMAGNETIC NAVIGATION BROCHOSCOPY N/A 10/06/2015   Procedure: ELECTROMAGNETIC NAVIGATION BRONCHOSCOPY;  Surgeon: Flora Lipps, MD;  Location: [facility];  Service: Cardiopulmonary;  Laterality: N/A;  . ENDOBRONCHIAL ULTRASOUND N/A 10/06/2015   Procedure: ENDOBRONCHIAL ULTRASOUND;  Surgeon: Flora Lipps, MD;  Location: [facility];  Service: Cardiopulmonary;  Laterality: N/A;  . FLEXIBLE BRONCHOSCOPY Right 07/11/2018   Procedure: FLEXIBLE BRONCHOSCOPY;  Surgeon: [name], MD;  Location: [facility];  Service: Thoracic;  Laterality: Right;  . KYPHOPLASTY N/A 09/28/2016   Procedure: KYPHOPLASTY;  Surgeon: Hessie Knows, MD;  Location: [facility];  Service: Orthopedics;  Laterality: N/A;  . KYPHOPLASTY N/A 02/20/2018    Procedure: [id number];  Surgeon: Hessie Knows, MD;  Location: [facility];  Service: Orthopedics;  Laterality: N/A;  . PACEMAKER INSERTION  03/2012  . thorocentesis  12/24/2016  . VAGINAL HYSTERECTOMY     partial - left ovary remains  . VIDEO ASSISTED THORACOSCOPY Right 07/11/2018   Procedure: VIDEO ASSISTED THORACOSCOPY;  Surgeon: [name], MD;  Location: [facility];  Service: Thoracic;  Laterality: Right;  Marland Kitchen VIDEO ASSISTED THORACOSCOPY (VATS) W/TALC PLEUADESIS Right 07/11/2018   Procedure: THORACOTOMY PLEURAL BIOPSY WITH TALC PLEURODESIS;  Surgeon: [name], MD;  Location: [facility];  Service: Thoracic;  Laterality: Right;  pleural biopsy     Current Meds  Medication Sig  . acetaminophen (TYLENOL) 500 MG tablet Take 1,000 mg by mouth every 6 (six) hours as needed for mild pain or moderate pain.   Marland Kitchen amiodarone (PACERONE) 200 MG tablet TAKE 1 TABLET BY MOUTH AS DIRECTED (2 TABLETS TWICE DAILY x 5 DAYS THEN 1 TABLET TWICE A DAY THEREAFTER (Patient taking differently: Take 200 mg by mouth daily. )  . budesonide (PULMICORT) 0.5 MG/2ML nebulizer solution INHALE 2 MILLILITERS BY NEBULIZER 2 TIMES DAILY  . cetirizine (ZYRTEC) 10 MG tablet TAKE (1) TABLET BY MOUTH EVERY DAY (Patient taking differently: Take 10 mg by mouth daily. )  . ELIQUIS 5 MG TABS tablet TAKE (1) TABLET BY MOUTH TWICE DAILY  . ENTRESTO 24-26 MG TAKE (1) TABLET BY MOUTH TWICE DAILY  . furosemide (LASIX) 20 MG tablet Take 71m (one tablet) daily. May take an additional tablet as needed for weight gain of 3lb overnight or 4lb in 1 week.  .Marland Kitchenipratropium-albuterol (DUONEB) 0.5-2.5 (3) MG/3ML SOLN TAKE 3 MLS BY NEBULIZATION EVERY 6 HOURSAS NEEDED FOR WHEEZING OR SHORTNESS OF BREATH  . levothyroxine (SYNTHROID) 88 MCG tablet Take 1 tablet (88 mcg total) by mouth daily before breakfast.  . metoprolol succinate (TOPROL-XL) 25 MG 24 hr tablet TAKE ONE TABLET BY MOUTH EVERY MORNING AND TAKE TWO TABLETS BY MOUTH EVERY EVENING  .  Multiple Vitamins-Minerals (CENTRUM SILVER PO) Take 1 tablet by mouth daily.  . OXYGEN Inhale 3 L into the lungs.   . rosuvastatin (CRESTOR) 5 MG tablet TAKE ONE (1) TABLET BY MOUTH ONCE DAILY  . senna (SENOKOT) 8.6 MG TABS tablet Take 1 tablet (8.6 mg total) by mouth daily.  .Maretta Bees175 MCG/3ML SOLN Use one vial in nebulizer once daily.  Generic: revefenacin  Allergies:   Cefdinir and Lovenox [enoxaparin sodium]   Social History   Tobacco Use  . Smoking status: Former Smoker    Packs/day: 1.00    Years: 40.00    Pack years: 40.00    Types: Cigarettes    Quit date: 08/30/2000    Years since quitting: 19.3  . Smokeless tobacco: Never Used  . Tobacco comment: quit smoking in 08/28/2000. Smoking cessation materials not required  Substance Use Topics  . Alcohol use: Not Currently    Alcohol/week: 10.0 standard drinks    Types: 10 Glasses of wine per week    Comment: 2 glasses of wine per day  . Drug use: No     Family Hx: The patient's family history includes COPD in her brother, mother, and sister; Colon cancer in her brother; Hypertension in her brother and sister; Lung disease in her father; Stroke in her maternal grandmother. There is no history of Heart attack or Breast cancer.  ROS:   Please see the history of present illness.    Review of Systems  Constitutional: Negative.   HENT: Negative.   Respiratory: Positive for shortness of breath.   Cardiovascular: Positive for palpitations.  Gastrointestinal: Negative.   Musculoskeletal: Negative.   Neurological: Positive for dizziness.  Psychiatric/Behavioral: Negative.   All other systems reviewed and are negative.    Labs/Other Tests and Data Reviewed:    Recent Labs: 01/15/2019: ALT 15 09/06/2019: B Natriuretic Peptide 710.0 09/20/2019: BUN 20; Creatinine, Ser 1.16; Potassium 4.3; Sodium 135 09/28/2019: Hemoglobin 11.6; Platelets 288 10/02/2019: TSH 5.130   Recent Lipid Panel Lab Results  Component Value Date/Time    CHOL 131 08/25/2017 09:37 AM   TRIG 159 (H) 08/25/2017 09:37 AM   HDL 36 (L) 08/25/2017 09:37 AM   CHOLHDL 3.6 08/25/2017 09:37 AM   LDLCALC 63 08/25/2017 09:37 AM    Wt Readings from Last 3 Encounters:  12/17/19 150 lb 4 oz (68.2 kg)  11/09/19 148 lb 2 oz (67.2 kg)  10/19/19 159 lb (72.1 kg)     Exam:    Vital Signs: Vital signs may also be detailed in the HPI BP (!) 88/56 (BP Location: Left Arm, Patient Position: Sitting, Cuff Size: Normal)   Pulse (!) 116   Ht _0  (1.676 m)   Wt 150 lb 4 oz (68.2 kg)   SpO2 92% Comment: unable to obtain  BMI 24.25 kg/m   Constitutional:  oriented to person, place, and time. No distress.  HENT:  Head: Grossly normal Eyes:  no discharge. No scleral icterus.  Neck: No JVD, no carotid bruits  Cardiovascular: Irregular, rapid no murmurs appreciated Pulmonary/Chest: Clear to auscultation bilaterally, no wheezes or rails Abdominal: Soft.  no distension.  no tenderness.  Musculoskeletal: Normal range of motion Neurological:  normal muscle tone. Coordination normal. No atrophy Skin: Skin warm and dry Psychiatric: normal affect, pleasant   ASSESSMENT & PLAN:    PAF (paroxysmal atrial fibrillation) (HCC) - Plan: EKG 12-Lead Appears to be atrial fib/flutter today with rapid rate V pacing Discussed with her various treatment options available We are calling [facility] to see if they can get her in, may need to have her device reprogrammed --Suggested she increase amiodarone up to 200 twice daily --We will need to get her back to normal sinus rhythm This could be done in [place] or at [facility] She has been compliant with anticoagulation Addendum: We did contact [facility], they recommended going to the emergency room at [facility]  Hypotension  Chronically low, worse in the setting of arrhythmia today  Paroxysmal atrial fibrillation (HCC) - Previous ablation at [facility] On anticoagulation Maintaining normal sinus rhythm on today's visit  Atrial  flutter Followed by Dr. Glennon Mac, EP at [facility] Prior ablation On amiodarone, anticoagulation, with metoprolol Back in fib flutter today it would appear  Hyperlipidemia - Cholesterol is at goal on the current lipid regimen. No changes to the medications were made.  Chronic systolic CHF (congestive heart failure) (HCC) Appears euvolemic, no changes to her medications Lungs are clear on auscultation Expressed to her the need to restore normal sinus rhythm as soon as possible  DM type 2, goal A1c below 7 Managed through diet restriction and she is unable to exercise Weight stable  COPD exacerbation ([place]) Previously treated with prednisone Previously with bronchitis, stable recently  Chronic back pain Management per orthopedics  Follow-up 6 months   Total encounter time more than 45 minutes  Greater than 50% was spent in counseling and coordination of care with the patient   Signed, Ida Rogue, MD  12/17/2019 1:30 PM    [place] [facility] [address], [place], [place] [address]

## 2019-12-17 NOTE — Telephone Encounter (Signed)
Spoke with patient and she is currently at [facility] waiting to be seen. She had appointment information and had no further questions at this time.

## 2019-12-17 NOTE — Telephone Encounter (Signed)
STAT if HR is under 50 or over 120 (normal HR is 60-100 beats per minute)  1) What is your heart rate? 133 range to 151  2) Do you have a log of your heart rate readings (document readings)? All these readings are from this morning starting at 5:30 am at 128  3) Do you have any other symptoms? Low BP 78/50

## 2019-12-17 NOTE — Telephone Encounter (Signed)
Please call to discuss appointments with [facility].

## 2019-12-17 NOTE — Patient Instructions (Addendum)
Marvia Pickles, MD  [facility] [address]  [place], [place] [address]  [phone]  [phone] ([address]    [place], [place]  [place] [address]  [place],  [address]  [phone]  [phone] (Fax)        Medication Instructions:  No changes  If you need a refill on your cardiac medications before your next appointment, please call your pharmacy.    Lab work: No new labs needed   If you have labs (blood work) drawn today and your tests are completely normal, you will receive your results only by: Marland Kitchen MyChart Message (if you have MyChart) OR . A paper copy in the mail If you have any lab test that is abnormal or we need to change your treatment, we will call you to review the results.   Testing/Procedures: No new testing needed   Follow-Up: At [facility], you and your health needs are our priority.  As part of our continuing mission to provide you with exceptional heart care, we have created designated Provider Care Teams.  These Care Teams include your primary Cardiologist (physician) and Advanced Practice Providers (APPs -  Physician Assistants and Nurse Practitioners) who all work together to provide you with the care you need, when you need it.  . You will need a follow up appointment in 1 month .  Marland Kitchen Providers on your designated Care Team:   . Murray Hodgkins, NP . Christell Faith, PA-C . Marrianne Mood, PA-C  Any Other Special Instructions Will Be Listed Below (If Applicable).  For educational health videos Log in to : [email or web address] Or : [email or web address], password : [email or web address]

## 2019-12-17 NOTE — Progress Notes (Signed)
Called over to [facility] regarding patient and reviewed patients status of symptomatic, V-paced, with underlining afib, with question about device settings needing to be changed and/or cardioversion. Faxed over EKG's to number provided. They called back to have her go to ED at [facility] and then scheduled her appointment on Wednesday 12/19/19 at 12:30 PM. Called and spoke with both patient and son with recommendations and they verbalized understanding with no further questions at this time.

## 2019-12-17 NOTE — Telephone Encounter (Signed)
Spoke with patient and inquired if she could be here for a 11:40 AM appointment. She states they will leave right now to head here for appointment with no further questions.

## 2019-12-18 MED ORDER — METOPROLOL SUCCINATE ER 25 MG PO TB24
25.00 | ORAL_TABLET | ORAL | Status: DC
Start: 2019-12-19 — End: 2019-12-18

## 2019-12-18 MED ORDER — LEVOTHYROXINE SODIUM 88 MCG PO TABS
88.00 | ORAL_TABLET | ORAL | Status: DC
Start: 2019-12-19 — End: 2019-12-18

## 2019-12-18 MED ORDER — AMIODARONE HCL 200 MG PO TABS
200.00 | ORAL_TABLET | ORAL | Status: DC
Start: 2019-12-19 — End: 2019-12-18

## 2019-12-18 MED ORDER — SODIUM CHLORIDE FLUSH 0.9 % IV SOLN
5.00 | INTRAVENOUS | Status: DC
Start: 2019-12-18 — End: 2019-12-18

## 2019-12-18 MED ORDER — SACUBITRIL-VALSARTAN 24-26 MG PO TABS
1.00 | ORAL_TABLET | ORAL | Status: DC
Start: 2019-12-18 — End: 2019-12-18

## 2019-12-18 MED ORDER — WEIGHT LOSS DAILY MULTI PO TABS
5.00 | ORAL_TABLET | ORAL | Status: DC
Start: 2019-12-18 — End: 2019-12-18

## 2019-12-18 MED ORDER — ROSUVASTATIN CALCIUM 5 MG PO TABS
5.00 | ORAL_TABLET | ORAL | Status: DC
Start: 2019-12-18 — End: 2019-12-18

## 2019-12-18 NOTE — Telephone Encounter (Signed)
Attempted to schedule no ans no vm  

## 2019-12-24 ENCOUNTER — Encounter: Payer: Self-pay | Admitting: Internal Medicine

## 2020-01-10 ENCOUNTER — Other Ambulatory Visit: Payer: Self-pay | Admitting: Cardiovascular Disease

## 2020-01-21 ENCOUNTER — Telehealth: Payer: Self-pay | Admitting: Internal Medicine

## 2020-01-21 NOTE — Telephone Encounter (Signed)
Copied from [place] [id number]. Topic: General - Inquiry >> Jan 21, 2020 10:39 AM Jenny Cooper wrote: Reason for CRM: Patient is requesting a call back from [facility] regarding a cough shes been having . Please advise

## 2020-01-22 ENCOUNTER — Ambulatory Visit: Payer: Medicare Other | Admitting: Cardiovascular Disease

## 2020-01-22 LAB — BASIC METABOLIC PANEL
BUN: 15 (ref 4–21)
CO2: 28 — AB (ref 13–22)
Chloride: 98 — AB (ref 99–108)
Creatinine: 1.3 — AB (ref 0.5–1.1)
Glucose: 94
Potassium: 4.1 (ref 3.4–5.3)
Sodium: 138 (ref 137–147)

## 2020-01-22 LAB — CBC AND DIFFERENTIAL
Hemoglobin: 10.3 — AB (ref 12.0–16.0)
WBC: 4.2

## 2020-01-22 LAB — HEPATIC FUNCTION PANEL
ALT: 12 (ref 7–35)
AST: 42 — AB (ref 13–35)
Alkaline Phosphatase: 99 (ref 25–125)
Bilirubin, Total: 0.9

## 2020-01-22 LAB — CBC: RBC: 3.83 — AB (ref 3.87–5.11)

## 2020-01-22 LAB — TSH: TSH: 10.04 — AB (ref 0.41–5.90)

## 2020-01-22 LAB — COMPREHENSIVE METABOLIC PANEL: Calcium: 8.5 — AB (ref 8.7–10.7)

## 2020-01-23 ENCOUNTER — Encounter: Payer: Self-pay | Admitting: Internal Medicine

## 2020-01-23 ENCOUNTER — Other Ambulatory Visit: Payer: Self-pay

## 2020-01-23 ENCOUNTER — Ambulatory Visit (INDEPENDENT_AMBULATORY_CARE_PROVIDER_SITE_OTHER): Payer: Medicare Other | Admitting: Internal Medicine

## 2020-01-23 VITALS — BP 102/68 | HR 70 | Temp 97.7°F | Ht 66.0 in | Wt 158.0 lb

## 2020-01-23 DIAGNOSIS — J441 Chronic obstructive pulmonary disease with (acute) exacerbation: Secondary | ICD-10-CM | POA: Diagnosis not present

## 2020-01-23 DIAGNOSIS — G620 Drug-induced polyneuropathy: Secondary | ICD-10-CM

## 2020-01-23 DIAGNOSIS — I25709 Atherosclerosis of coronary artery bypass graft(s), unspecified, with unspecified angina pectoris: Secondary | ICD-10-CM

## 2020-01-23 MED ORDER — DOXYCYCLINE HYCLATE 100 MG PO TABS: 100.0000 mg | ORAL_TABLET | Freq: Two times a day (BID) | ORAL | 0 refills | Status: AC

## 2020-01-23 MED ORDER — FLUTICASONE PROPIONATE 50 MCG/ACT NA SUSP: 2.0000 | Freq: Every day | NASAL | 5 refills | Status: DC

## 2020-01-23 NOTE — Progress Notes (Signed)
Date:  01/23/2020   Name:  Jenny Cooper   DOB:  June 20, 1936   MRN:  [id number]   Chief Complaint: Cough (Yellow production. At least 6 weeks. Feels extra fluid in her lungs. )  Cough This is a chronic problem. The current episode started 1 to 4 weeks ago. The problem has been gradually worsening. The problem occurs every few minutes. The cough is productive of sputum (thick yellow). Associated symptoms include nasal congestion, postnasal drip and shortness of breath. Pertinent negatives include no chest pain, chills, fever, headaches, sore throat or wheezing. Treatments tried: has pulmicort and duonebs plus Yulpelri. The treatment provided mild relief. Her past medical history is significant for COPD. and hx of lung cancer    Lab Results  Component Value Date   CREATININE 1.16 (H) 09/20/2019   BUN 20 09/20/2019   NA 135 09/20/2019   K 4.3 09/20/2019   CL 99 09/20/2019   CO2 25 09/20/2019   Lab Results  Component Value Date   CHOL 131 08/25/2017   HDL 36 (L) 08/25/2017   LDLCALC 63 08/25/2017   TRIG 159 (H) 08/25/2017   CHOLHDL 3.6 08/25/2017   Lab Results  Component Value Date   TSH 5.130 (H) 10/02/2019   Lab Results  Component Value Date   HGBA1C 5.5 09/28/2019   Lab Results  Component Value Date   WBC 5.5 09/28/2019   HGB 11.6 09/28/2019   HCT 34.4 09/28/2019   MCV 90 09/28/2019   PLT 288 09/28/2019   Lab Results  Component Value Date   ALT 15 01/15/2019   AST 29 01/15/2019   ALKPHOS 96 01/15/2019   BILITOT 1.0 01/15/2019     Review of Systems  Constitutional: Positive for fatigue. Negative for chills and fever.  HENT: Positive for congestion, postnasal drip and sinus pressure. Negative for sore throat and trouble swallowing.   Respiratory: Positive for cough and shortness of breath. Negative for chest tightness and wheezing.   Cardiovascular: Negative for chest pain.  Gastrointestinal: Negative for nausea and vomiting.  Neurological: Negative for  dizziness, light-headedness and headaches.    Patient Active Problem List   Diagnosis Date Noted  . Low vitamin B12 level 01/14/2019  . Pulmonary hypertension, unspecified ([place]) 12/01/2018  . Chronic respiratory failure with hypoxia ([place]) 12/01/2018  . Normocytic anemia 10/17/2018  . Acute on chronic respiratory failure with hypoxia ([place]) 08/21/2018  . Recurrent pleural effusion on right 07/11/2018  . Environmental and seasonal allergies 12/20/2017  . Muscle spasms of neck 12/20/2017  . Muscle ache 09/15/2017  . Coronary artery disease of bypass graft of native heart with stable angina pectoris ([place]) 04/21/2017  . Chronic combined systolic and diastolic CHF (congestive heart failure) ([place]) 04/14/2017  . Sinus node dysfunction ([place]) 04/06/2017  . Back pain of thoracolumbar region 03/15/2017  . PAF (paroxysmal atrial fibrillation) ([place]) 03/05/2017  . Falls frequently 03/05/2017  . Pleural effusion 12/23/2016  . Cancer of hilus of right lung ([place]) 12/14/2016  . Compression fracture of L2 lumbar vertebra ([place]) 08/31/2016  . Acute midline low back pain without sciatica 08/13/2016  . Fracture of multiple pubic rami, right, closed, initial encounter ([place]) 05/30/2016  . Cardiomyopathy ([place]) 04/29/2016  . Hx of adenomatous colonic polyps 11/17/2015  . Radiation pneumonitis ([place]) 10/21/2015  . Mitral regurgitation   . HLD (hyperlipidemia)   . Paroxysmal supraventricular tachycardia ([place])   . NSTEMI (non-ST elevated myocardial infarction) ([place])   . Arteriosclerosis of coronary artery 01/11/2015  .  Hypothyroidism (acquired) 01/11/2015  . Disorder of peripheral nervous system 10/04/2014  . Cervical radiculopathy, chronic 10/04/2014  . Lung cancer ([place]) 10/04/2014  . Atrial flutter ([place]) 11/16/2013  . Chronic obstructive pulmonary disease ([place]) 04/24/2012  . Essential hypertension 08/03/2011  . Pacemaker 08/03/2011    Allergies  Allergen Reactions  . Cefdinir Rash    Had hives after  completing treatment - unsure if it was the cause  . Lovenox [Enoxaparin Sodium] Itching    Past Surgical History:  Procedure Laterality Date  . ABLATION  04/2016   [facility]  . APPENDECTOMY    . CARDIAC CATHETERIZATION N/A 04/28/2015   Procedure: Left Heart Cath and Coronary Angiography;  Surgeon: Wellington Hampshire, MD;  Location: [place] [facility];  Service: Cardiovascular;  Laterality: N/A;  . CARDIOVERSION N/A 04/08/2017   Procedure: CARDIOVERSION;  Surgeon: Wellington Hampshire, MD;  Location: [facility];  Service: Cardiovascular;  Laterality: N/A;  . CHEST TUBE INSERTION N/A 07/11/2018   Procedure: PLEURX CATH INSERTION;  Surgeon: [name], MD;  Location: [facility];  Service: Thoracic;  Laterality: N/A;  . COLONOSCOPY  12/2009   2 small tubular adenomas  . CORONARY ARTERY BYPASS GRAFT  09/2000  . DRAIN REMOVAL Right 08/10/2018   Procedure: DRAIN REMOVAL;  Surgeon: [name], MD;  Location: [facility];  Service: General;  Laterality: Right;  . ECTOPIC PREGNANCY SURGERY    . ELECTROMAGNETIC NAVIGATION BROCHOSCOPY N/A 10/06/2015   Procedure: ELECTROMAGNETIC NAVIGATION BRONCHOSCOPY;  Surgeon: Flora Lipps, MD;  Location: [facility];  Service: Cardiopulmonary;  Laterality: N/A;  . ENDOBRONCHIAL ULTRASOUND N/A 10/06/2015   Procedure: ENDOBRONCHIAL ULTRASOUND;  Surgeon: Flora Lipps, MD;  Location: [facility];  Service: Cardiopulmonary;  Laterality: N/A;  . FLEXIBLE BRONCHOSCOPY Right 07/11/2018   Procedure: FLEXIBLE BRONCHOSCOPY;  Surgeon: [name], MD;  Location: [facility];  Service: Thoracic;  Laterality: Right;  . KYPHOPLASTY N/A 09/28/2016   Procedure: KYPHOPLASTY;  Surgeon: Hessie Knows, MD;  Location: [facility];  Service: Orthopedics;  Laterality: N/A;  . KYPHOPLASTY N/A 02/20/2018   Procedure: [id number];  Surgeon: Hessie Knows, MD;  Location: [facility];  Service: Orthopedics;  Laterality: N/A;  . PACEMAKER INSERTION  03/2012  . thorocentesis  12/24/2016  . VAGINAL HYSTERECTOMY      partial - left ovary remains  . VIDEO ASSISTED THORACOSCOPY Right 07/11/2018   Procedure: VIDEO ASSISTED THORACOSCOPY;  Surgeon: [name], MD;  Location: [facility];  Service: Thoracic;  Laterality: Right;  Marland Kitchen VIDEO ASSISTED THORACOSCOPY (VATS) W/TALC PLEUADESIS Right 07/11/2018   Procedure: THORACOTOMY PLEURAL BIOPSY WITH TALC PLEURODESIS;  Surgeon: [name], MD;  Location: [facility];  Service: Thoracic;  Laterality: Right;  pleural biopsy    Social History   Tobacco Use  . Smoking status: Former Smoker    Packs/day: 1.00    Years: 40.00    Pack years: 40.00    Types: Cigarettes    Quit date: 08/30/2000    Years since quitting: 19.4  . Smokeless tobacco: Never Used  . Tobacco comment: quit smoking in 08/28/2000. Smoking cessation materials not required  Substance Use Topics  . Alcohol use: Not Currently    Alcohol/week: 10.0 standard drinks    Types: 10 Glasses of wine per week    Comment: 2 glasses of wine per day  . Drug use: No     Medication list has been reviewed and updated.  Current Meds  Medication Sig  . acetaminophen (TYLENOL) 500 MG tablet Take 1,000 mg by mouth every 6 (six)  hours as needed for mild pain or moderate pain.   Marland Kitchen amiodarone (PACERONE) 200 MG tablet Take 1 tablet (200 mg total) by mouth 2 (two) times daily.  . budesonide (PULMICORT) 0.5 MG/2ML nebulizer solution INHALE 2 MILLILITERS BY NEBULIZER 2 TIMES DAILY  . cetirizine (ZYRTEC) 10 MG tablet TAKE (1) TABLET BY MOUTH EVERY DAY (Patient taking differently: Take 10 mg by mouth daily. )  . ELIQUIS 5 MG TABS tablet TAKE (1) TABLET BY MOUTH TWICE DAILY  . ENTRESTO 24-26 MG TAKE (1) TABLET BY MOUTH TWICE DAILY  . furosemide (LASIX) 20 MG tablet Take 80m (one tablet) daily. May take an additional tablet as needed for weight gain of 3lb overnight or 4lb in 1 week.  .Marland Kitchenipratropium-albuterol (DUONEB) 0.5-2.5 (3) MG/3ML SOLN TAKE 3 MLS BY NEBULIZATION EVERY 6 HOURSAS NEEDED FOR WHEEZING OR SHORTNESS OF  BREATH  . levothyroxine (SYNTHROID) 88 MCG tablet Take 1 tablet (88 mcg total) by mouth daily before breakfast.  . metoprolol succinate (TOPROL-XL) 25 MG 24 hr tablet TAKE ONE TABLET BY MOUTH EVERY MORNING AND TAKE TWO TABLETS BY MOUTH EVERY EVENING (Patient taking differently: TAKE TWO TABLET BY MOUTH EVERY MORNING AND TAKE TWO TABLETS BY MOUTH EVERY EVENING)  . Multiple Vitamins-Minerals (CENTRUM SILVER PO) Take 1 tablet by mouth daily.  . OXYGEN Inhale 3 L into the lungs.   . rosuvastatin (CRESTOR) 5 MG tablet Take 1 tablet (5 mg total) by mouth daily.  .Marland Kitchensenna (SENOKOT) 8.6 MG TABS tablet Take 1 tablet (8.6 mg total) by mouth daily.  .Maretta Bees175 MCG/3ML SOLN Use one vial in nebulizer once daily.  Generic: revefenacin    PHQ 2/9 Scores 01/23/2020 10/19/2019 09/28/2019 05/30/2019  PHQ - 2 Score 0 0 0 0  PHQ- 9 Score 0 - - 0    BP Readings from Last 3 Encounters:  01/23/20 102/68  12/17/19 (!) 88/56  11/09/19 (!) 98/50    Physical Exam Vitals and nursing note reviewed.  Constitutional:      General: She is not in acute distress.    Appearance: Normal appearance. She is well-developed.  HENT:     Head: Normocephalic and atraumatic.  Cardiovascular:     Rate and Rhythm: Normal rate and regular rhythm.     Pulses: Normal pulses.  Pulmonary:     Effort: Pulmonary effort is normal. No respiratory distress.     Breath sounds: Decreased air movement and transmitted upper airway sounds present. No rhonchi or rales.  Musculoskeletal:        General: Normal range of motion.  Lymphadenopathy:     Cervical: No cervical adenopathy.  Skin:    General: Skin is warm and dry.     Findings: No rash.  Neurological:     Mental Status: She is alert and oriented to person, place, and time.  Psychiatric:        Behavior: Behavior normal.        Thought Content: Thought content normal.     Wt Readings from Last 3 Encounters:  01/23/20 158 lb (71.7 kg)  12/17/19 150 lb 4 oz (68.2 kg)    11/09/19 148 lb 2 oz (67.2 kg)    BP 102/68   Pulse 70   Temp 97.7 F (36.5 C) (Oral)   Ht _0  (1.676 m)   Wt 158 lb (71.7 kg)   SpO2 (!) 75%   BMI 25.50 kg/m   Assessment and Plan: 1. COPD with acute exacerbation (HCC) Recommend Pulmicort with duonebs  bid; duonebs mid afternoon if needed Doxycycline since Zpak and amiodarone interact - doxycycline (VIBRA-TABS) 100 MG tablet; Take 1 tablet (100 mg total) by mouth 2 (two) times daily for 10 days.  Dispense: 20 tablet; Refill: 0  2. Drug-induced polyneuropathy ([place]) Stable since lung cancer treatment   Partially dictated using Editor, commissioning. Any errors are unintentional.  Halina Maidens, MD [facility]  01/23/2020   Partially dictated using Dragon software. Any errors are unintentional.  Halina Maidens, MD [facility]  01/23/2020

## 2020-01-25 ENCOUNTER — Telehealth: Payer: Self-pay | Admitting: Cardiovascular Disease

## 2020-01-25 NOTE — Telephone Encounter (Signed)
Went to [facility] on Tuesday for device and it was because she had tachycardia. She had DCCV roughly 6 weeks ago. Then she started having fast heart rate and everything went out of wack. [facility] tried to adjust settings on her device but she continues to retain fluid. Weight 159.6 lb. Weight was down to 147 lbs in the past. The swelling legs and feet. Patient was coughing throughout our conversation to the point that it was hard to understand. Requested her to repeat dosing changes multiple times due to that. She was taking Furosemide 20 mg once a day and last Tuesday they increased to 40 mg once daily. She called them again today due to no results and they told her to increase Furosemide 40 mg twice a day until next Friday. I am not confident about the latter instruction because of the severity of her cough. She had mentioned 80 mg twice a day but reviewed that she has 20 mg pills which would be 4 pills and she said that it was 2 pills. Advised that I would forward this to provider for review and confirmed her appointment for next week. She verbalized understanding with no further questions at this time.

## 2020-01-25 NOTE — Telephone Encounter (Signed)
Notes reviewed from [place].  Seen by Dr. Rockey Situ 12/17/19 with recurrent atrial arrhythmia. Scheduled for prompt appointment with [facility]. 12/18/19 underwent DCCV. 01/13/20 recurrent symptoms. 01/22/20 seen by [facility]. Atria arrhythmia since 01/13/20. PPM was tracking atrial arrhythmia up to 130bpm w/o mode-switching.  Burst pacing unsuccessful in EP office but programming changes allowed device to switch to DDIR. She had self increased her Lasix to 40mg  daily without success. Her Toprol was increased to 50mg  BID. Subsequent phone call when [facility] recommended increasing Lasix to 40mg  BID over the weekend and calling them back Monday.  Labs 01/22/20: K 4.1, creatinine 1.3, GFR 38, Hb 10.3, AST 42, ALT 12, Alb 3, BNP 4657  Agree with Lasix 40mg  BID over the weekend. Would recommend sooner follow up Monday or Tuesday.   If worsening shortness of breath over the weekend recommend evaluation in the ED. Recommend elevating lower extremities when sitting, low salt diet.   Loel Dubonnet, NP

## 2020-01-25 NOTE — Telephone Encounter (Signed)
Spoke with patient, reviewed recommendations, and there are no appointments I can find for Tuesday. Advised that I would have scheduling call to see if they can get her in to be seen. She verbalized understanding with no further questions at this time.

## 2020-01-25 NOTE — Telephone Encounter (Signed)
Pt c/o swelling: STAT is pt has developed SOB within 24 hours  1) How much weight have you gained and in what time span? In ~2 weeks gained 11 pounds  2) If swelling, where is the swelling located? All over  3) Are you currently taking a fluid pill? Yes - it was increased by nurse at [facility] but has not helped   4) Are you currently SOB? Yes   5) Do you have a log of your daily weights (if so, list)?   6) Have you gained 3 pounds in a day or 5 pounds in a week?   7) Have you traveled recently?   Patient scheduled an appt with C Berge on 02/01/20 Please call

## 2020-01-25 NOTE — Telephone Encounter (Signed)
Patient calling requesting to speak with Pam again Please call

## 2020-01-25 NOTE — Telephone Encounter (Signed)
She did go back and listen to message from [place]. She is to take Furosemide 40 mg twice a day through Monday. She is then to call them back.

## 2020-01-29 ENCOUNTER — Ambulatory Visit (INDEPENDENT_AMBULATORY_CARE_PROVIDER_SITE_OTHER): Payer: Medicare Other | Admitting: Family

## 2020-01-29 ENCOUNTER — Other Ambulatory Visit: Payer: Self-pay

## 2020-01-29 ENCOUNTER — Encounter: Payer: Self-pay | Admitting: Family

## 2020-01-29 VITALS — BP 110/54 | HR 69 | Ht 66.0 in | Wt 158.1 lb

## 2020-01-29 DIAGNOSIS — I5033 Acute on chronic diastolic (congestive) heart failure: Secondary | ICD-10-CM

## 2020-01-29 DIAGNOSIS — I5042 Chronic combined systolic (congestive) and diastolic (congestive) heart failure: Secondary | ICD-10-CM | POA: Diagnosis not present

## 2020-01-29 DIAGNOSIS — I25709 Atherosclerosis of coronary artery bypass graft(s), unspecified, with unspecified angina pectoris: Secondary | ICD-10-CM

## 2020-01-29 DIAGNOSIS — J441 Chronic obstructive pulmonary disease with (acute) exacerbation: Secondary | ICD-10-CM

## 2020-01-29 DIAGNOSIS — I10 Essential (primary) hypertension: Secondary | ICD-10-CM

## 2020-01-29 NOTE — Patient Instructions (Addendum)
Medication Instructions:   Your physician has recommended you make the following change in your medication:   CONTINUE Lasix 40mg  twice daily  Recommend over the counter Muccinex for productive cough  *If you need a refill on your cardiac medications before your next appointment, please call your pharmacy*  Lab Work: Your physician recommends that you return for lab work today: BMET, BNP  If you have labs (blood work) drawn today and your tests are completely normal, you will receive your results only by: Marland Kitchen MyChart Message (if you have MyChart) OR . A paper copy in the mail If you have any lab test that is abnormal or we need to change your treatment, we will call you to review the results.   Testing/Procedures:  EKG today shows paced sinus rhythm.   Your physician has requested that you have an echocardiogram. Echocardiography is a painless test that uses sound waves to create images of your heart. It provides your doctor with information about the size and shape of your heart and how well your heart's chambers and valves are working. This procedure takes approximately one hour. There are no restrictions for this procedure.   Follow-Up: At [facility], you and your health needs are our priority.  As part of our continuing mission to provide you with exceptional heart care, we have created designated Provider Care Teams.  These Care Teams include your primary Cardiologist (physician) and Advanced Practice Providers (APPs -  Physician Assistants and Nurse Practitioners) who all work together to provide you with the care you need, when you need it.  We recommend signing up for the patient portal called "MyChart".  Sign up information is provided on this After Visit Summary.  MyChart is used to connect with patients for Virtual Visits (Telemedicine).  Patients are able to view lab/test results, encounter notes, upcoming appointments, etc.  Non-urgent messages can be sent to your provider  as well.   To learn more about what you can do with MyChart, go to [email or web address].    Your next appointment:   4 week(s)  The format for your next appointment:   In Person  Provider:    You may see Ida Rogue, MD or one of the following Advanced Practice Providers on your designated Care Team:    Murray Hodgkins, NP  Christell Faith, PA-C  Marrianne Mood, PA-C  Laurann Montana, NP  Other Instructions   Call pulmonology - "I've had productive cough with yellow sputum since April 1st. My primary care started Doxycycline 01/22/20. My symptoms have remained the same. I've been doing nebulizers twice daily. I've been on my 3L of oxygen since April (previously on 2L). Seen by cardiology today who noted bilateral crackles and rhonchi. Do I need to change something? Do I need to wait the full 10 days for antibiotics? Do I need to come in?"

## 2020-01-29 NOTE — Telephone Encounter (Signed)
Dr. Mortimer Fries please advise on patient mychart message:  I've had productive cough with yellow sputum since April 1st.  My primary care started Doxycycline 01/22/20.  My symptoms have remained the same.  I've been doing nebulizers twice daily.  I've been on 3L of oxygen since April (previously on 2L).  Seen by a cardiologist today who noted bilateral crackles and rhonchi.  Do I need to change something?  Do I need to wait the full 10 days for antibiotics?  Do I need to come in? Jenny Cooper 06/29/2036

## 2020-01-29 NOTE — Telephone Encounter (Signed)
Patient has been scheduled for 10 am today

## 2020-01-29 NOTE — Telephone Encounter (Signed)
There are no appointments available until 6/3

## 2020-01-29 NOTE — Progress Notes (Signed)
Office Visit    Patient Name: Jenny Cooper Date of Encounter: 01/29/2020  Primary Care Provider:  Glean Hess, MD Primary Cardiologist:  Ida Rogue, MD Electrophysiologist:  None   Chief Complaint    Jenny Cooper is a 84 y.o. female with a hx of  COPD on home O2, former tobacco use, stage IIIb squamous cell lung cancer s/p chemoradiation 0865, diastolic heart failure, HTN, paroxysmal atrial fib/flutter on chronic anticoagulation and amiodarone therapy, CAD s/p CABG, PPM  presents today for lower extremity edema  Past Medical History    Past Medical History:  Diagnosis Date  . Acute midline low back pain without sciatica 08/13/2016  . Acute on chronic respiratory failure with hypoxia ([name]) 08/21/2018  . Acute respiratory failure with hypoxia ([name]) 12/11/2017  . Allergy   . Atypical atrial flutter ([name])    a. s/p ablation 07/27/2013 followed by Dr. Rockey Situ  . Balance problem   . CAD (coronary artery disease)    a. s/p MI x 2 in 2002 s/p PCI x 2 in 2002; b. s/p 2v CABG 2002; c. stress echo 07/2004 w/ evi of pos & inf infarct & no evi of ischemia; d. 4/08 dipyridamole scan w/ multiple areas of infarct, no ischemia, EF 49%; e. cath 04/28/15 3v CAD, med Rx rec, no targets for revasc, LM lum irregs, pLAD 30%, 100%, ost-pLCx 60%, [name] 99%, OM2 100%, p-mRCA 90%, [name] 100% L-R collats, VG-mLAD irregs, VG-OM2 oc  . Carcinoma of right lung ([name]) 01/03/2015   a. followed by Dr. Oliva Bustard  . Chronic systolic CHF (congestive heart failure) ([name])    a. echo 03/2015: EF 30-35%, sev ant/inf/pos HK, in mild to mod MR  . Complication of anesthesia    more recently patient oxygen levels do not rebound as quickly  . Compressed spine fracture ([name]) 08/06/2016   lumbar 2, t11, t12  . COPD (chronic obstructive pulmonary disease) ([name])   . Fracture of multiple pubic rami, right, closed, initial encounter ([name]) 05/30/2016   05/27/16 from a fall.  . Fractured pelvis ([name]) 05/26/2016   2 places   . GERD (gastroesophageal reflux disease)   . History of blood clots    12/2001 left leg  . History of colonoscopy 2013  . History of mammography, screening 2015  . History of Papanicolaou smear of cervix 2013  . HLD (hyperlipidemia)   . HTN (hypertension)   . Hypothyroidism   . Lung cancer ([name])   . Mitral regurgitation    a. s/p mitral ring placement 09/2000; b. echo 09/2010: EF 50%, inf HK, post AK, mild MR, prosthetic mitral valve ring w/ peak gradient of 10 mmHg; b. echo 2/13: EF 50%, mild MR/TR     . Myocardial infarction ([place])    X 2 (LAST ONE IN 2002)  . Neuropathy   . Pacemaker    a. MDT 2002; b. generator replacement 2013; c. followed by Dr. Omelia Blackwater, MD  . PAF (paroxysmal atrial fibrillation) ([name])    a. on Eliquis   . Personal history of chemotherapy   . Personal history of radiation therapy   . Pneumonia   . Type II diabetes mellitus with complication ([name]) 7/84/6962   Past Surgical History:  Procedure Laterality Date  . ABLATION  04/2016   [facility]  . APPENDECTOMY    . CARDIAC CATHETERIZATION N/A 04/28/2015   Procedure: Left Heart Cath and Coronary Angiography;  Surgeon: Wellington Hampshire, MD;  Location: [place] [facility];  Service: Cardiovascular;  Laterality: N/A;  . CARDIOVERSION N/A 04/08/2017   Procedure: CARDIOVERSION;  Surgeon: Wellington Hampshire, MD;  Location: [facility];  Service: Cardiovascular;  Laterality: N/A;  . CHEST TUBE INSERTION N/A 07/11/2018   Procedure: PLEURX CATH INSERTION;  Surgeon: [name], MD;  Location: [facility];  Service: Thoracic;  Laterality: N/A;  . COLONOSCOPY  12/2009   2 small tubular adenomas  . CORONARY ARTERY BYPASS GRAFT  09/2000  . DRAIN REMOVAL Right 08/10/2018   Procedure: DRAIN REMOVAL;  Surgeon: [name], MD;  Location: [facility];  Service: General;  Laterality: Right;  . ECTOPIC PREGNANCY SURGERY    . ELECTROMAGNETIC NAVIGATION BROCHOSCOPY N/A 10/06/2015   Procedure: ELECTROMAGNETIC NAVIGATION BRONCHOSCOPY;  Surgeon:  Flora Lipps, MD;  Location: [facility];  Service: Cardiopulmonary;  Laterality: N/A;  . ENDOBRONCHIAL ULTRASOUND N/A 10/06/2015   Procedure: ENDOBRONCHIAL ULTRASOUND;  Surgeon: Flora Lipps, MD;  Location: [facility];  Service: Cardiopulmonary;  Laterality: N/A;  . FLEXIBLE BRONCHOSCOPY Right 07/11/2018   Procedure: FLEXIBLE BRONCHOSCOPY;  Surgeon: [name], MD;  Location: [facility];  Service: Thoracic;  Laterality: Right;  . KYPHOPLASTY N/A 09/28/2016   Procedure: KYPHOPLASTY;  Surgeon: Hessie Knows, MD;  Location: [facility];  Service: Orthopedics;  Laterality: N/A;  . KYPHOPLASTY N/A 02/20/2018   Procedure: [id number];  Surgeon: Hessie Knows, MD;  Location: [facility];  Service: Orthopedics;  Laterality: N/A;  . PACEMAKER INSERTION  03/2012  . thorocentesis  12/24/2016  . VAGINAL HYSTERECTOMY     partial - left ovary remains  . VIDEO ASSISTED THORACOSCOPY Right 07/11/2018   Procedure: VIDEO ASSISTED THORACOSCOPY;  Surgeon: [name], MD;  Location: [facility];  Service: Thoracic;  Laterality: Right;  Marland Kitchen VIDEO ASSISTED THORACOSCOPY (VATS) W/TALC PLEUADESIS Right 07/11/2018   Procedure: THORACOTOMY PLEURAL BIOPSY WITH TALC PLEURODESIS;  Surgeon: [name], MD;  Location: [facility];  Service: Thoracic;  Laterality: Right;  pleural biopsy    Allergies  Allergies  Allergen Reactions  . Cefdinir Rash    Had hives after completing treatment - unsure if it was the cause  . Lovenox [Enoxaparin Sodium] Itching    History of Present Illness    Jenny Cooper is a 84 y.o. female with a hx of COPD on home O2, former tobacco use, stage IIIb squamous cell lung cancer s/p chemoradiation 5956, diastolic heart failure, HTN, paroxysmal atrial fib/flutter on chronic anticoagulation and amiodarone therapy, CAD s/p CABG, PPM last seen 12/17/2019 by Dr. Rockey Situ.  Seen by Dr. Rockey Situ 12/17/19 with recurrent atrial arrhythmia. Scheduled for prompt appointment with [facility]. 12/18/19 underwent DCCV. 01/13/20  recurrent symptoms. 01/22/20 seen by [facility]. Atrial arrhythmia since 01/13/20. PPM was tracking atrial arrhythmia up to 130bpm w/o mode-switching.  Burst pacing unsuccessful in EP office but programming changes allowed device to switch to DDIR. She had self increased her Lasix to 68m daily without success. Her Toprol was increased to 52mBID. Subsequent phone call when [facility] recommended increasing Lasix to 4076mID over the weekend and calling them back Monday.   12/17/19 clinic weight 150lb now 158lb today.   Labs 01/22/20: K 4.1, creatinine 1.3, GFR 38, Hb 10.3, AST 42, ALT 12, Alb 3, BNP 4657  Reports shortness of breath, wheezing, productive cough with thick yellow sputum.  She has been started on doxycycline by her primary care provider on 01/23/2020 for COPD exacerbation.  She has been using her Pulmicort and duo nebs twice daily.  Reports her dyspnea occurs with activity.  BP at home routinely 109/60.  Tells me as she is walking she is getting lightheaded and "weak all over ".  Present today with her son-in-law.  Since taking the Lasix 40 mg twice daily she reports no real change in her urine output.   EKGs/Labs/Other Studies Reviewed:   The following studies were reviewed today:  Echocardiogram 08/22/2018 Left ventricle: The cavity size was normal. Wall thickness was   normal. Systolic function was normal. The estimated ejection   fraction was in the range of 55% to 60%. Features are consistent   with a pseudonormal left ventricular filling pattern, with   concomitant abnormal relaxation and increased filling pressure   (grade 2 diastolic dysfunction). - Aortic valve: There was mild stenosis. Mean gradient (S): 7 mm   Hg. Valve area (VTI): 1.31 cm^2. - Mitral valve: Calcified annulus. There was mild to moderate   regurgitation. - Left atrium: The atrium was mildly dilated. - Pulmonary arteries: Systolic pressure was moderately to severely   increased. PA peak pressure: 63 mm Hg  (S). - Inferior vena cava: The vessel was dilated. The respirophasic   diameter changes were blunted (< 50%), consistent with elevated   central venous pressure. - Pericardium, extracardiac: A trivial pericardial effusion was   identified.   EKG:  EKG is ordered today.  The ekg ordered today demonstrates v-paced 69 bpm with underlying NSR -reviewed with Dr. Caryl Comes in clinic  Recent Labs: 09/06/2019: B Natriuretic Peptide 710.0 09/20/2019: BUN 20; Creatinine, Ser 1.16; Potassium 4.3; Sodium 135 09/28/2019: Hemoglobin 11.6; Platelets 288 10/02/2019: TSH 5.130  Recent Lipid Panel    Component Value Date/Time   CHOL 131 08/25/2017 0937   TRIG 159 (H) 08/25/2017 0937   HDL 36 (L) 08/25/2017 0937   CHOLHDL 3.6 08/25/2017 0937   LDLCALC 63 08/25/2017 0937    Home Medications   Current Meds  Medication Sig  . acetaminophen (TYLENOL) 500 MG tablet Take 1,000 mg by mouth every 6 (six) hours as needed for mild pain or moderate pain.   Marland Kitchen amiodarone (PACERONE) 200 MG tablet Take 1 tablet (200 mg total) by mouth 2 (two) times daily.  . budesonide (PULMICORT) 0.5 MG/2ML nebulizer solution INHALE 2 MILLILITERS BY NEBULIZER 2 TIMES DAILY  . cetirizine (ZYRTEC) 10 MG tablet TAKE (1) TABLET BY MOUTH EVERY DAY (Patient taking differently: Take 10 mg by mouth daily. )  . doxycycline (VIBRA-TABS) 100 MG tablet Take 1 tablet (100 mg total) by mouth 2 (two) times daily for 10 days.  Marland Kitchen ELIQUIS 5 MG TABS tablet TAKE (1) TABLET BY MOUTH TWICE DAILY  . ENTRESTO 24-26 MG TAKE (1) TABLET BY MOUTH TWICE DAILY  . fluticasone (FLONASE) 50 MCG/ACT nasal spray Place 2 sprays into both nostrils daily.  . furosemide (LASIX) 20 MG tablet Take 5m (one tablet) daily. May take an additional tablet as needed for weight gain of 3lb overnight or 4lb in 1 week. (Patient taking differently: 40 mg 2 (two) times daily. )  . ipratropium-albuterol (DUONEB) 0.5-2.5 (3) MG/3ML SOLN TAKE 3 MLS BY NEBULIZATION EVERY 6 HOURSAS NEEDED FOR  WHEEZING OR SHORTNESS OF BREATH  . levothyroxine (SYNTHROID) 88 MCG tablet Take 1 tablet (88 mcg total) by mouth daily before breakfast.  . metoprolol succinate (TOPROL-XL) 25 MG 24 hr tablet TAKE ONE TABLET BY MOUTH EVERY MORNING AND TAKE TWO TABLETS BY MOUTH EVERY EVENING (Patient taking differently: TAKE TWO TABLET BY MOUTH EVERY MORNING AND TAKE TWO TABLETS BY MOUTH EVERY EVENING)  . Multiple Vitamins-Minerals (CENTRUM SILVER PO) Take 1 tablet  by mouth daily.  . OXYGEN Inhale 3 L into the lungs.   . rosuvastatin (CRESTOR) 5 MG tablet Take 1 tablet (5 mg total) by mouth daily.  Marland Kitchen senna (SENOKOT) 8.6 MG TABS tablet Take 1 tablet (8.6 mg total) by mouth daily.  Maretta Bees 175 MCG/3ML SOLN Use one vial in nebulizer once daily.  Generic: revefenacin      Review of Systems    Review of Systems  Constitution: Negative for chills, fever and malaise/fatigue.  Cardiovascular: Positive for dyspnea on exertion. Negative for chest pain, irregular heartbeat, leg swelling, near-syncope, orthopnea, palpitations and syncope.  Respiratory: Positive for cough, sputum production and wheezing. Negative for shortness of breath.   Gastrointestinal: Negative for melena, nausea and vomiting.  Genitourinary: Negative for hematuria.  Neurological: Negative for dizziness, light-headedness and weakness.   All other systems reviewed and are otherwise negative except as noted above.  Physical Exam    VS:  BP (!) 110/54 (BP Location: Left Arm, Patient Position: Sitting, Cuff Size: Normal)   Pulse 69   Ht _0  (1.676 m)   Wt 158 lb 2 oz (71.7 kg)   SpO2 (!) 86% Comment: on 3L O2  BMI 25.52 kg/m  , BMI Body mass index is 25.52 kg/m. GEN: Thin, well developed, in no acute distress. HEENT: normal. Neck: Supple, no JVD, carotid bruits, or masses. Cardiac: RRR, no murmurs, rubs, or gallops. No clubbing, cyanosis.  Trace bilateral pedal edema radials/DP/PT 2+ and equal bilaterally.  Respiratory:  Respirations  regular and unlabored, productive cough, all lung fields with expiratory crackle and rhonchi, upper airway wheeze noted. GI: Soft, nontender, nondistended MS: No deformity or atrophy. Skin: Warm and dry, no rash. Neuro:  Strength and sensation are intact. Psych: Normal affect.    Assessment & Plan    1. Diastolic heart failure exacerbation - Up 8 pounds from office visit 6 weeks ago.  Endorses dyspnea on exertion and lower extremity edema.  Likely triggered by her recurrent atrial fibrillation.  Lasix has been increased by [facility].  Presently on 40 mg twice daily.  Does not note significantly increased urine output.  Labs 01/22/2020 creatinine 1.3, GFR 38, K4.1, BNP 4657  BMP, BNP.  Reevaluate diuretic dose based on result.  If BNP still significantly elevated despite increase Lasix dose consider transition to torsemide.  Update echocardiogram.  2. COPD exacerbation -productive cough with yellow sputum and wheeze for multiple weeks.  Started on doxycycline by PCP 01/23/2020.  Reports no improvement in symptoms.  Lung fields with rhonchi and coarse expiratory crackles throughout.  Encouraged to call her pulmonologist.  Recommend Mucinex over-the-counter.  She is hesitant regarding prednisone as has caused recurrence of atrial fibrillation in the past.  Encouraged her to complete her course of doxycycline unless otherwise advised.  Reports no hypoxia at home but has had to increase her home O2 to 3 L from her previous 2 L within the last couple months.  3. Paroxysmal atrial fib/flutter -follows with EP at [facility].  Required cardioversion 12/18/2019.  Continue amiodarone 200 mg twice daily.  Continue Toprol 50 mg twice daily-recently increased by EP at [facility]. No signs of toxicity on amiodarone.  Continue Eliquis 5 mg twice daily.  Does not presently qualify for reduced dosing but will need to monitor renal function carefully.  Denies bleeding complications.  4. PPM -follows with [facility].  EKG today shows  ventricular paced rhythm with underlying sinus rhythm.  Reviewed with Dr. Caryl Comes in the office.  5.  CAD s/p CABG -stable with no anginal symptoms.  No indication for ischemic evaluation at this time.  Continue GDMT including statin, beta-blocker.  No aspirin secondary to chronic anticoagulation.  Disposition: Follow up in 4 week(s) with Dr. Rockey Situ or APP   Loel Dubonnet, NP 01/29/2020, 10:17 AM

## 2020-01-30 LAB — BASIC METABOLIC PANEL
BUN/Creatinine Ratio: 10 — ABNORMAL LOW (ref 12–28)
BUN: 13 mg/dL (ref 8–27)
CO2: 31 mmol/L — ABNORMAL HIGH (ref 20–29)
Calcium: 9.2 mg/dL (ref 8.7–10.3)
Chloride: 96 mmol/L (ref 96–106)
Creatinine, Ser: 1.29 mg/dL — ABNORMAL HIGH (ref 0.57–1.00)
GFR calc Af Amer: 45 mL/min/{1.73_m2} — ABNORMAL LOW (ref >59)
GFR calc non Af Amer: 39 mL/min/{1.73_m2} — ABNORMAL LOW (ref >59)
Glucose: 94 mg/dL (ref 65–99)
Potassium: 4.2 mmol/L (ref 3.5–5.2)
Sodium: 139 mmol/L (ref 134–144)

## 2020-01-30 LAB — BRAIN NATRIURETIC PEPTIDE: BNP: 1418.9 pg/mL — ABNORMAL HIGH (ref 0.0–100.0)

## 2020-01-30 MED ORDER — PREDNISONE 20 MG PO TABS: 20.0000 mg | ORAL_TABLET | Freq: Every day | ORAL | 0 refills | Status: DC

## 2020-02-01 ENCOUNTER — Ambulatory Visit: Payer: Medicare Other | Admitting: Nurse Practitioner

## 2020-02-04 ENCOUNTER — Encounter: Payer: Self-pay | Admitting: Internal Medicine

## 2020-02-05 ENCOUNTER — Other Ambulatory Visit: Payer: Self-pay

## 2020-02-06 ENCOUNTER — Ambulatory Visit: Payer: Medicare Other | Admitting: Cardiovascular Disease

## 2020-02-06 ENCOUNTER — Other Ambulatory Visit: Payer: Self-pay | Admitting: Cardiovascular Disease

## 2020-02-11 ENCOUNTER — Encounter: Payer: Self-pay | Admitting: Primary Care

## 2020-02-11 ENCOUNTER — Ambulatory Visit (INDEPENDENT_AMBULATORY_CARE_PROVIDER_SITE_OTHER): Payer: Medicare Other | Admitting: Primary Care

## 2020-02-11 ENCOUNTER — Other Ambulatory Visit: Payer: Self-pay

## 2020-02-11 ENCOUNTER — Other Ambulatory Visit
Admission: RE | Admit: 2020-02-11 | Discharge: 2020-02-11 | Disposition: A | Discharge status: Home / Self Care | Payer: Medicare Other | Attending: Primary Care | Admitting: Primary Care

## 2020-02-11 VITALS — BP 124/82 | HR 78 | Temp 97.1°F | Ht 66.0 in | Wt 152.2 lb

## 2020-02-11 DIAGNOSIS — I5042 Chronic combined systolic (congestive) and diastolic (congestive) heart failure: Secondary | ICD-10-CM | POA: Diagnosis present

## 2020-02-11 DIAGNOSIS — C3490 Malignant neoplasm of unspecified part of unspecified bronchus or lung: Secondary | ICD-10-CM

## 2020-02-11 DIAGNOSIS — R911 Solitary pulmonary nodule: Secondary | ICD-10-CM

## 2020-02-11 DIAGNOSIS — J449 Chronic obstructive pulmonary disease, unspecified: Secondary | ICD-10-CM

## 2020-02-11 DIAGNOSIS — J9611 Chronic respiratory failure with hypoxia: Secondary | ICD-10-CM

## 2020-02-11 LAB — BASIC METABOLIC PANEL
Anion gap: 11 (ref 5–15)
BUN: 29 mg/dL — ABNORMAL HIGH (ref 8–23)
CO2: 31 mmol/L (ref 22–32)
Calcium: 8.8 mg/dL — ABNORMAL LOW (ref 8.9–10.3)
Chloride: 97 mmol/L — ABNORMAL LOW (ref 98–111)
Creatinine, Ser: 1.65 mg/dL — ABNORMAL HIGH (ref 0.44–1.00)
GFR calc Af Amer: 33 mL/min — ABNORMAL LOW (ref >60)
GFR calc non Af Amer: 29 mL/min — ABNORMAL LOW (ref >60)
Glucose, Bld: 98 mg/dL (ref 70–99)
Potassium: 4.6 mmol/L (ref 3.5–5.1)
Sodium: 139 mmol/L (ref 135–145)

## 2020-02-11 LAB — BRAIN NATRIURETIC PEPTIDE: B Natriuretic Peptide: 2112.1 pg/mL — ABNORMAL HIGH (ref 0.0–100.0)

## 2020-02-11 MED ORDER — MUCINEX 600 MG PO TB12
600.0000 mg | ORAL_TABLET | Freq: Two times a day (BID) | ORAL | 2 refills | Status: DC | PRN
Start: 2020-02-11 — End: 2021-08-05

## 2020-02-11 NOTE — Assessment & Plan Note (Signed)
-   S/p chemoradiation in 2016 - CXR in April with [facility] showed bilateral heterogeneous opacities favored to represent mild pulmonary edema, there was an increased prominence and opacity right hilar region at the site of patients previously treated neoplasm - Needs CT chest wo contrast to follow-up

## 2020-02-11 NOTE — Assessment & Plan Note (Signed)
-   Experiences shortness of breath on exertion, chronic cough with mucus production. No signs of acute COPD exacerbation. She had rales on exam consistent with fluid retention, lasix recently increased to 40mg  twice daily. She has an echocardiogram scheduled for July. - Continue Pulmicort nebulizer twice daily and Yupelri once daily  - Use ipratropium/albuterol every 6 hours as needed for shortness of breath/wheezing  - Recommend she take mucinex 600mg  twice daily and given a flutter valve for her to use 3 times a day followed by coughing exercises

## 2020-02-11 NOTE — Patient Instructions (Addendum)
Orders: - Re-new oxygen order with [facility]  - CT chest wo contrast first  re: pulmonary nodule, hx stage II squamous cell lung cancer   Recommendations: - Take mucinex 600mg  twice daily   - Flutter valve 3 times a day - 10 breath cycle followed by coughing exercises  - Continue Pulmicort nebulizer twice daily and Yupelri once daily  - Use ipratropium/albuterol every 6 hours as needed for shortness of breath/wheezing   Follow-up: - 4-6 months with Dr. Mortimer Fries    Home Oxygen Use, Adult When a medical condition keeps you from getting enough oxygen, your health care provider may instruct you to take extra oxygen at home. Your health care provider will let you know:  When to take oxygen.  For how long to take oxygen.  How quickly oxygen should be delivered (flow rate), in liters per minute (LPM or L/M). Home oxygen can be given through:  A mask.  A nasal cannula. This is a device or tube that goes in the nostrils.  A transtracheal catheter. This is a small, flexible tube placed in the trachea.  A tracheostomy. This is a surgically made opening in the trachea. These devices are connected with tubing to an oxygen source, such as:  A tank. Tanks hold oxygen in gas form. They must be replaced when the oxygen is used up.  A liquid oxygen device. This holds oxygen in liquid form. It must be replaced when the oxygen is used up.  An oxygen concentrator machine. This filters oxygen in the room. It uses electricity, so you must have a backup cylinder of oxygen in case the power goes out. Supplies needed: To use oxygen, you will need:  A mask, nasal cannula, transtracheal catheter, or tracheostomy.  An oxygen tank, a liquid oxygen device, or an oxygen concentrator.  The tape that your health care provider recommends (optional). If you use a transtracheal catheter and your prescribed flow rate is 1 LPM or greater, you will also need a humidifier. Risks and complications  Fire. This can  happen if the oxygen is exposed to a heat source, flame, or spark.  Injury to skin. This can happen if liquid oxygen touches your skin.  Organ damage. This can happen if you get too little oxygen. How to use oxygen Your health care provider or a representative from your [place] will show you how to use your oxygen device. Follow her or his instructions. The instructions may look something like this: 1. Wash your hands. 2. If you use an oxygen concentrator, make sure it is plugged in. 3. Place one end of the tube into the port on the tank, device, or machine. 4. Place the mask over your nose and mouth. Or, place the nasal cannula and secure it with tape if instructed. If you use a tracheostomy or transtracheal catheter, connect it to the oxygen source as directed. 5. Make sure the liter-flow setting on the machine is at the level prescribed by your health care provider. 6. Turn on the machine or adjust the knob on the tank or device to the correct liter-flow setting. 7. When you are done, turn off and unplug the machine, or turn the knob to OFF. How to clean and care for the oxygen supplies Nasal cannula  Clean it with a warm, wet cloth daily or as needed.  Wash it with a liquid soap once a week.  Rinse it thoroughly once or twice a week.  Replace it every 2-4 weeks.  If  you have an infection, such as a cold or pneumonia, change the cannula when you get better. Mask  Replace it every 2-4 weeks.  If you have an infection, such as a cold or pneumonia, change the mask when you get better. Humidifier bottle  Wash the bottle between each refill: ? Wash it with soap and warm water. ? Rinse it thoroughly. ? Disinfect it and its top. ? Air-dry it.  Make sure it is dry before you refill it. Oxygen concentrator  Clean the air filter at least twice a week according to directions from your home medical equipment and service company.  Wipe down the cabinet every day. To  do this: ? Unplug the unit. ? Wipe down the cabinet with a damp cloth. ? Dry the cabinet. Other equipment  Change any extra tubing every 1-3 months.  Follow instructions from your health care provider about taking care of any other equipment. Safety tips Fire safety tips   Keep your oxygen and oxygen supplies at least 5 ft away from sources of heat, flames, and sparks at all times.  Do not allow smoking near your oxygen. Put up "no smoking" signs in your home. Avoid smoking areas when in public.  Do not use materials that can burn (are flammable) while you use oxygen.  When you go to a restaurant with portable oxygen, ask to be seated in the nonsmoking section.  Keep a Data processing manager close by. Let your fire department know that you have oxygen in your home.  Test your home smoke detectors regularly. Traveling  Secure your oxygen tank in the vehicle so that it does not move around. Follow instructions from your medical device company about how to safely secure your tank.  Make sure you have enough oxygen for the amount of time you will be away from home.  If you are planning air travel, contact the airline to find out if they allow the use of an approved portable oxygen concentrator. You may also need documents from your health care provider and medical device company before you travel. General safety tips  If you use an oxygen cylinder, make sure it is in a stand or secured to an object that will not move (fixed object).  If you use liquid oxygen, make sure its container is kept upright.  If you use an oxygen concentrator: ? [employer] psych[employer] company. Make sure you are given priority service in the event that your power goes out. ? Avoid using extension cords, if possible. Follow these instructions at home:  Use oxygen only as told by your health care provider.  Do not use alcohol or other drugs that make you relax (sedating drugs) unless instructed. They can slow down  your breathing rate and make it hard to get in enough oxygen.  Know how and when to order a refill of oxygen.  Always keep a spare tank of oxygen. Plan ahead for holidays when you may not be able to get a prescription filled.  Use water-based lubricants on your lips or nostrils. Do not use oil-based products like petroleum jelly.  To prevent skin irritation on your cheeks or behind your ears, tuck some gauze under the tubing. Contact a health care provider if:  You get headaches often.  You have shortness of breath.  You have a lasting cough.  You have anxiety.  You are sleepy all the time.  You develop an illness that affects your breathing.  You cannot exercise at your regular level.  You are restless.  You have difficult or irregular breathing, and it is getting worse.  You have a fever.  You have persistent redness under your nose. Get help right away if:  You are confused.  You have blue lips or fingernails.  You are struggling to breathe. Summary  Your health care provider or a representative from your [place] will show you how to use your oxygen device. Follow her or his instructions.  If you use an oxygen concentrator, make sure it is plugged in.  Make sure the liter-flow setting on the machine is at the level prescribed by your health care provider.  Keep your oxygen and oxygen supplies at least 5 ft away from sources of heat, flames, and sparks at all times. This information is not intended to replace advice given to you by your health care provider. Make sure you discuss any questions you have with your health care provider. Document Revised: 02/02/2018 Document Reviewed: 03/09/2016 Elsevier Patient Education  [place].  Chronic Obstructive Pulmonary Disease Chronic obstructive pulmonary disease (COPD) is a long-term (chronic) lung problem. When you have COPD, it is hard for air to get in and out of your lungs. Usually the  condition gets worse over time, and your lungs will never return to normal. There are things you can do to keep yourself as healthy as possible.  Your doctor may treat your condition with: ? Medicines. ? Oxygen. ? Lung surgery.  Your doctor may also recommend: ? Rehabilitation. This includes steps to make your body work better. It may involve a team of specialists. ? Quitting smoking, if you smoke. ? Exercise and changes to your diet. ? Comfort measures (palliative care). Follow these instructions at home: Medicines  Take over-the-counter and prescription medicines only as told by your doctor.  Talk to your doctor before taking any cough or allergy medicines. You may need to avoid medicines that cause your lungs to be dry. Lifestyle  If you smoke, stop. Smoking makes the problem worse. If you need help quitting, ask your doctor.  Avoid being around things that make your breathing worse. This may include smoke, chemicals, and fumes.  Stay active, but remember to rest as well.  Learn and use tips on how to relax.  Make sure you get enough sleep. Most adults need at least 7 hours of sleep every night.  Eat healthy foods. Eat smaller meals more often. Rest before meals. Controlled breathing Learn and use tips on how to control your breathing as told by your doctor. Try:  Breathing in (inhaling) through your nose for 1 second. Then, pucker your lips and breath out (exhale) through your lips for 2 seconds.  Putting one hand on your belly (abdomen). Breathe in slowly through your nose for 1 second. Your hand on your belly should move out. Pucker your lips and breathe out slowly through your lips. Your hand on your belly should move in as you breathe out.  Controlled coughing Learn and use controlled coughing to clear mucus from your lungs. Follow these steps: 1. Lean your head a little forward. 2. Breathe in deeply. 3. Try to hold your breath for 3 seconds. 4. Keep your mouth  slightly open while coughing 2 times. 5. Spit any mucus out into a tissue. 6. Rest and do the steps again 1 or 2 times as needed. General instructions  Make sure you get all the shots (vaccines) that your doctor recommends. Ask your doctor about a flu shot  and a pneumonia shot.  Use oxygen therapy and pulmonary rehabilitation if told by your doctor. If you need home oxygen therapy, ask your doctor if you should buy a tool to measure your oxygen level (oximeter).  Make a COPD action plan with your doctor. This helps you to know what to do if you feel worse than usual.  Manage any other conditions you have as told by your doctor.  Avoid going outside when it is very hot, cold, or humid.  Avoid people who have a sickness you can catch (contagious).  Keep all follow-up visits as told by your doctor. This is important. Contact a doctor if:  You cough up more mucus than usual.  There is a change in the color or thickness of the mucus.  It is harder to breathe than usual.  Your breathing is faster than usual.  You have trouble sleeping.  You need to use your medicines more often than usual.  You have trouble doing your normal activities such as getting dressed or walking around the house. Get help right away if:  You have shortness of breath while resting.  You have shortness of breath that stops you from: ? Being able to talk. ? Doing normal activities.  Your chest hurts for longer than 5 minutes.  Your skin color is more blue than usual.  Your pulse oximeter shows that you have low oxygen for longer than 5 minutes.  You have a fever.  You feel too tired to breathe normally. Summary  Chronic obstructive pulmonary disease (COPD) is a long-term lung problem.  The way your lungs work will never return to normal. Usually the condition gets worse over time. There are things you can do to keep yourself as healthy as possible.  Take over-the-counter and prescription  medicines only as told by your doctor.  If you smoke, stop. Smoking makes the problem worse. This information is not intended to replace advice given to you by your health care provider. Make sure you discuss any questions you have with your health care provider. Document Revised: 07/29/2017 Document Reviewed: 09/20/2016 Elsevier Patient Education  2020 [employer].

## 2020-02-11 NOTE — Assessment & Plan Note (Signed)
-   Increased weight gain and LE edema, diuretics recently increased  - Continue lasix 40mg  twice daily - Checking repeat BNP and BMET

## 2020-02-11 NOTE — Progress Notes (Signed)
@Patient  ID: Jenny Cooper, female    DOB: [birth year], 84 y.o.   MRN: [id number]  Chief Complaint  Patient presents with  . Follow-up    pt reports of sob with exertion, dry cough at times prod with light yellow mucus and wheezing. on 3L cont.     Referring provider: Glean Hess, MD  HPI:  84 year old female, former smoker. PMH significant for moderate COPD (Ratio 57% FEV1 58%), stage III squamous cell lung cancer (s/p chemoradiation 2016), new LLL mass negative for maliganancy. Currently on amiodarone therapy. Maintained on Pulmicort and Yupelri. PET scan showed no sig concerns for recurrent malignancy, radiation changes and pleural effusion. Patient of Dr. Mortimer Fries.   Previous [facility] pulmonary encounters: 08/17/2019 Patient presents today for regular office visit, needs walk test per DME company. Reports increased shortness of breath on exertion over the last several weeks. Wearing 2L oxygen continuously. Reports morning mucus production, typically light yellow sputum. Using Pulmicort nebulizer twice daily and Yupelri was prescribed. Continue oral anticoagulation. Takes Lasix once daily, occasionally misses a dose. Reports increase in her weight and abdominal swelling. She is up 4 lbs on her home scale in two weeks. Ambulatory O2 low 85% on 2l. Requiring 4L, recovered to 99% with rest. Denies shortness of breath at rest, chest congestion, chest tightness/pain or wheezing.   08/28/2019 Patient contacted today for 2 week follow-up televisit. Reports that she is still coughing, mostly in the morning. Associated nasal drainage and hacking cough in the afternoon. She did not notice an improvement in breathing after increasing her lasix x 5 days. Reports that she gets out of breath with walking, esp worse the last 3 weeks. She remains on eliquis. Denies chest pain, chest tightness, hemoptysis.    02/11/2020 Patient presents today for regular annual follow-up and to re-qualify for oxygen with  [employer]. She continues to use Pulmicort twice a day and Yupelri once a day as prescribed. When she does her breathing treatments she wears oxygen. States that she has seen an improvement in her oxygen levels and has noted it up to 95% on 3L oxygen at rest. She will increase her oxygen to 4L while exerting herself. She has had a lot of trouble with post nasal drip and chest congestion. She was able to cough up a good deal of mucus. No change in color. She is taking zyrtec and Flonase daily.   She reports recent 9 lb weight gain and edema from fluid retention. She also has noticed abdominal bloating and tightness. She has been taking two 20mg  tablets of lasix in the morning and evening. Her current weight is 152 lbs as of yesterday.  She saw [facility] this spring to have her pacemaker interrogated, she had a cardioversion in April. They also recommended increasing lasix to 40mg  twice a day. CXR performed at [facility] in April showed bilateral heterogeneous opacities favored to represent mild pulmonary edema, there was an increased prominence and opacity right hilar region at the site of patients previously treated neoplasm, recommended CT chest. She saw cardiology on June 1st. She is scheduled for echocardiogram on July 8th and follow up visit with cardiology on July 12th.  Imaging: 06/19/2018 PET scan- Increase consolidation about the RIGHT hilum with mild to moderate metabolic activity is favored benign post therapy change. Large RIGHT effusion unchanged.  08/17/19 CXR - Stable areas of fibrosis with scarring primarily present in the right perihilar and posterior mid lung regions. There is also scarring in the right  base with rather minimal right pleural effusion. No edema or consolidation evident  12/16/19 CXR-  Bilateral heterogeneous opacities favored to represent mild pulmonary edema, there was an increased prominence and opacity right hilar region at the site of patients previously treated neoplasm, recommended  CT chest   Allergies  Allergen Reactions  . Cefdinir Rash    Had hives after completing treatment - unsure if it was the cause  . Lovenox [Enoxaparin Sodium] Itching    Immunization History  Administered Date(s) Administered  . Influenza, High Dose Seasonal PF 06/17/2014, 07/04/2018, 07/01/2019  . Influenza,inj,Quad PF,6+ Mos 07/01/2014, 05/28/2015, 05/31/2016, 04/21/2017  . Influenza-Unspecified 07/20/2011, 05/03/2012, 07/01/2014, 05/28/2015, 05/31/2016, 04/21/2017  . PFIZER SARS-COV-2 Vaccination 09/09/2019, 10/10/2019  . Pneumococcal Conjugate-13 04/21/2017  . Pneumococcal Polysaccharide-23 08/03/2011  . Zoster 05/01/2011    Past Medical History:  Diagnosis Date  . Acute midline low back pain without sciatica 08/13/2016  . Acute on chronic respiratory failure with hypoxia ([place]) 08/21/2018  . Acute respiratory failure with hypoxia ([place]) 12/11/2017  . Allergy   . Atypical atrial flutter ([place])    a. s/p ablation 07/27/2013 followed by Dr. Rockey Situ  . Balance problem   . CAD (coronary artery disease)    a. s/p MI x 2 in 2002 s/p PCI x 2 in 2002; b. s/p 2v CABG 2002; c. stress echo 07/2004 w/ evi of pos & inf infarct & no evi of ischemia; d. 4/08 dipyridamole scan w/ multiple areas of infarct, no ischemia, EF 49%; e. cath 04/28/15 3v CAD, med Rx rec, no targets for revasc, LM lum irregs, pLAD 30%, 100%, ost-pLCx 60%, [name] 99%, OM2 100%, p-mRCA 90%, [name] 100% L-R collats, VG-mLAD irregs, VG-OM2 oc  . Carcinoma of right lung ([place]) 01/03/2015   a. followed by Dr. Oliva Bustard  . Chronic systolic CHF (congestive heart failure) ([place])    a. echo 03/2015: EF 30-35%, sev ant/inf/pos HK, in mild to mod MR  . Complication of anesthesia    more recently patient oxygen levels do not rebound as quickly  . Compressed spine fracture ([place]) 08/06/2016   lumbar 2, t11, t12  . COPD (chronic obstructive pulmonary disease) ([place])   . Fracture of multiple pubic rami, right, closed, initial encounter ([place])  05/30/2016   05/27/16 from a fall.  . Fractured pelvis ([place]) 05/26/2016   2 places  . GERD (gastroesophageal reflux disease)   . History of blood clots    12/2001 left leg  . History of colonoscopy 2013  . History of mammography, screening 2015  . History of Papanicolaou smear of cervix 2013  . HLD (hyperlipidemia)   . HTN (hypertension)   . Hypothyroidism   . Lung cancer ([place])   . Mitral regurgitation    a. s/p mitral ring placement 09/2000; b. echo 09/2010: EF 50%, inf HK, post AK, mild MR, prosthetic mitral valve ring w/ peak gradient of 10 mmHg; b. echo 2/13: EF 50%, mild MR/TR     . Myocardial infarction ([place])    X 2 (LAST ONE IN 2002)  . Neuropathy   . Pacemaker    a. MDT 2002; b. generator replacement 2013; c. followed by Dr. Omelia Blackwater, MD  . PAF (paroxysmal atrial fibrillation) ([place])    a. on Eliquis   . Personal history of chemotherapy   . Personal history of radiation therapy   . Pneumonia   . Type II diabetes mellitus with complication ([place]) 1/61/0960    Tobacco History: Social History   Tobacco Use  Smoking Status  Former Smoker  . Packs/day: 1.00  . Years: 40.00  . Pack years: 40.00  . Types: Cigarettes  . Quit date: 08/30/2000  . Years since quitting: 19.4  Smokeless Tobacco Never Used  Tobacco Comment   quit smoking in 08/28/2000. Smoking cessation materials not required   Counseling given: Not Answered Comment: quit smoking in 08/28/2000. Smoking cessation materials not required   Outpatient Medications Prior to Visit  Medication Sig Dispense Refill  . acetaminophen (TYLENOL) 500 MG tablet Take 1,000 mg by mouth every 6 (six) hours as needed for mild pain or moderate pain.     Marland Kitchen amiodarone (PACERONE) 200 MG tablet Take 1 tablet (200 mg total) by mouth 2 (two) times daily. 180 tablet 3  . budesonide (PULMICORT) 0.5 MG/2ML nebulizer solution INHALE 2 MILLILITERS BY NEBULIZER 2 TIMES DAILY 360 mL 5  . cetirizine (ZYRTEC) 10 MG tablet TAKE (1) TABLET BY MOUTH  EVERY DAY (Patient taking differently: Take 10 mg by mouth daily. ) 30 tablet 12  . ELIQUIS 5 MG TABS tablet TAKE (1) TABLET BY MOUTH TWICE DAILY 180 tablet 6  . ENTRESTO 24-26 MG TAKE (1) TABLET BY MOUTH TWICE DAILY 60 tablet 0  . fluticasone (FLONASE) 50 MCG/ACT nasal spray Place 2 sprays into both nostrils daily. 16 g 5  . furosemide (LASIX) 20 MG tablet Take 20mg  (one tablet) daily. May take an additional tablet as needed for weight gain of 3lb overnight or 4lb in 1 week. (Patient taking differently: 40 mg 2 (two) times daily. ) 45 tablet 11  . ipratropium-albuterol (DUONEB) 0.5-2.5 (3) MG/3ML SOLN TAKE 3 MLS BY NEBULIZATION EVERY 6 HOURSAS NEEDED FOR WHEEZING OR SHORTNESS OF BREATH 360 mL 1  . KLOR-CON M20 20 MEQ tablet Take 20 mEq by mouth daily.    Marland Kitchen levothyroxine (SYNTHROID) 88 MCG tablet Take 1 tablet (88 mcg total) by mouth daily before breakfast. 30 tablet 12  . metoprolol succinate (TOPROL-XL) 25 MG 24 hr tablet TAKE ONE TABLET BY MOUTH EVERY MORNING AND TAKE TWO TABLETS BY MOUTH EVERY EVENING (Patient taking differently: TAKE TWO TABLET BY MOUTH EVERY MORNING AND TAKE TWO TABLETS BY MOUTH EVERY EVENING) 270 tablet 3  . Multiple Vitamins-Minerals (CENTRUM SILVER PO) Take 1 tablet by mouth daily.    . OXYGEN Inhale 3 L into the lungs.     . rosuvastatin (CRESTOR) 5 MG tablet Take 1 tablet (5 mg total) by mouth daily. 30 tablet 0  . senna (SENOKOT) 8.6 MG TABS tablet Take 1 tablet (8.6 mg total) by mouth daily. 30 each 0  . YUPELRI 175 MCG/3ML SOLN Use one vial in nebulizer once daily.  Generic: revefenacin 200 mL 11  . predniSONE (DELTASONE) 20 MG tablet Take 1 tablet (20 mg total) by mouth daily with breakfast. 7 tablet 0   Facility-Administered Medications Prior to Visit  Medication Dose Route Frequency Provider Last Rate Last Admin  . sodium chloride 0.9 % injection 10 mL  10 mL Intravenous PRN Choksi, Janak, MD   10 mL at 02/19/15 1000  . sodium chloride flush (NS) 0.9 % injection  10 mL  10 mL Intravenous PRN Cammie Sickle, MD   10 mL at 02/16/16 1048    Review of Systems  Review of Systems  Constitutional: Positive for unexpected weight change.  HENT: Positive for postnasal drip.   Respiratory: Positive for cough and shortness of breath.   Cardiovascular: Positive for leg swelling.   Physical Exam  BP 124/82 (BP Location: Left Arm,  Cuff Size: Normal)   Pulse 78   Temp (!) 97.1 F (36.2 C) (Temporal)   Ht 5\' 6"  (1.676 m)   Wt 152 lb 3.2 oz (69 kg)   SpO2 94%   BMI 24.57 kg/m  Physical Exam Constitutional:      Appearance: Normal appearance.  HENT:     Head: Normocephalic and atraumatic.  Cardiovascular:     Rate and Rhythm: Normal rate and regular rhythm.  Pulmonary:     Effort: Pulmonary effort is normal. No respiratory distress.     Breath sounds: Rales present.  Skin:    General: Skin is warm and dry.  Neurological:     General: No focal deficit present.     Mental Status: She is alert and oriented to person, place, and time. Mental status is at baseline.  Psychiatric:        Mood and Affect: Mood normal.        Behavior: Behavior normal.        Thought Content: Thought content normal.        Judgment: Judgment normal.      Lab Results:  CBC    Component Value Date/Time   WBC 4.2 01/22/2020 0000   WBC 3.7 (L) 08/17/2019 1303   RBC 3.83 (A) 01/22/2020 0000   HGB 10.3 (A) 01/22/2020 0000   HGB 11.6 09/28/2019 1620   HCT 34 (A) 10/25/2019 0000   HCT 34.4 09/28/2019 1620   PLT 288 09/28/2019 1620   MCV 90 09/28/2019 1620   MCV 92 12/24/2014 1457   MCH 30.3 09/28/2019 1620   MCH 30.3 08/17/2019 1303   MCHC 33.7 09/28/2019 1620   MCHC 31.4 08/17/2019 1303   RDW 13.9 09/28/2019 1620   RDW 21.9 (H) 12/24/2014 1457   LYMPHSABS 0.6 (L) 09/28/2019 1620   LYMPHSABS 1.0 12/24/2014 1457   MONOABS 0.5 08/17/2019 1303   MONOABS 0.8 12/24/2014 1457   EOSABS 0.1 09/28/2019 1620   EOSABS 0.2 12/24/2014 1457   BASOSABS 0.0  09/28/2019 1620   BASOSABS 0.0 12/24/2014 1457    BMET    Component Value Date/Time   NA 139 02/11/2020 1601   NA 139 01/29/2020 1105   NA 136 12/24/2014 1457   K 4.6 02/11/2020 1601   K 3.6 12/24/2014 1457   CL 97 (L) 02/11/2020 1601   CL 98 (L) 12/24/2014 1457   CO2 31 02/11/2020 1601   CO2 32 12/24/2014 1457   GLUCOSE 98 02/11/2020 1601   GLUCOSE 107 (H) 12/24/2014 1457   BUN 29 (H) 02/11/2020 1601   BUN 13 01/29/2020 1105   BUN 17 12/24/2014 1457   CREATININE 1.65 (H) 02/11/2020 1601   CREATININE 1.00 12/24/2014 1457   CALCIUM 8.8 (L) 02/11/2020 1601   CALCIUM 9.5 12/24/2014 1457   GFRNONAA 29 (L) 02/11/2020 1601   GFRNONAA 55 (L) 12/24/2014 1457   GFRAA 33 (L) 02/11/2020 1601   GFRAA >60 12/24/2014 1457    BNP    Component Value Date/Time   BNP 2,112.1 (H) 02/11/2020 1601    ProBNP No results found for: PROBNP  Imaging: No results found.   Assessment & Plan:   Chronic obstructive pulmonary disease (HCC) - Experiences shortness of breath on exertion, chronic cough with mucus production. No signs of acute COPD exacerbation. She had rales on exam consistent with fluid retention, lasix recently increased to 40mg  twice daily. She has an echocardiogram scheduled for July. - Continue Pulmicort nebulizer twice daily and Yupelri once daily  -  Use ipratropium/albuterol every 6 hours as needed for shortness of breath/wheezing  - Recommend she take mucinex 600mg  twice daily and given a flutter valve for her to use 3 times a day followed by coughing exercises   Squamous cell lung cancer ([place]) - S/p chemoradiation in 2016 - CXR in April with [facility] showed bilateral heterogeneous opacities favored to represent mild pulmonary edema, there was an increased prominence and opacity right hilar region at the site of patients previously treated neoplasm - Needs CT chest wo contrast to follow-up   Chronic respiratory failure with hypoxia ([place]) - O2 86% at rest on room air;  requiring 3L on exertion to keep O2 >90% - Re-new oxygen order with [employer]    Martyn Ehrich, NP 02/11/2020

## 2020-02-11 NOTE — Assessment & Plan Note (Addendum)
-   O2 86% at rest on room air; requiring 3L on exertion to keep O2 >90% - Re-new oxygen order with DME[employer]

## 2020-02-13 ENCOUNTER — Encounter: Payer: Self-pay | Admitting: Internal Medicine

## 2020-02-13 NOTE — Progress Notes (Signed)
Please let patient know that her BNP was significantly elevated > 2,000. Her shortness of breath is most likely secondary to acute on chronic heart failure. She is currently taking 40mg  twice daily. I would recommend she contact cardiology to see if they want to change her diuretic further. If she can not speak with their office tomorrow or be seen, have her notify us

## 2020-02-18 ENCOUNTER — Ambulatory Visit
Admission: RE | Admit: 2020-02-18 | Discharge: 2020-02-18 | Disposition: A | Discharge status: Home / Self Care | Payer: Medicare Other | Source: Ambulatory Visit | Attending: Primary Care | Admitting: Primary Care

## 2020-02-18 ENCOUNTER — Other Ambulatory Visit: Payer: Self-pay

## 2020-02-18 DIAGNOSIS — R911 Solitary pulmonary nodule: Secondary | ICD-10-CM | POA: Insufficient documentation

## 2020-02-20 NOTE — Progress Notes (Signed)
Please let patient know CT chest showed stable post radiation changes right lug, similar to PET scan. No suspicious nodules. Mild interstitial and pulmonary edema- follow up with cardiology as scheduled in July

## 2020-02-25 NOTE — Progress Notes (Signed)
Closing encounter after three attempts.

## 2020-02-26 ENCOUNTER — Ambulatory Visit: Payer: Medicare Other | Admitting: Family

## 2020-03-06 ENCOUNTER — Other Ambulatory Visit: Payer: Self-pay

## 2020-03-06 ENCOUNTER — Ambulatory Visit: Payer: Medicare Other

## 2020-03-06 DIAGNOSIS — I5033 Acute on chronic diastolic (congestive) heart failure: Secondary | ICD-10-CM

## 2020-03-08 NOTE — Progress Notes (Signed)
Date:  03/10/2020   ID:  Jenny Cooper, DOB 31-Oct-1935, MRN [id number]  Patient Location:  [place] [address]   Provider location:   [facility], [place] office  PCP:  Glean Hess, MD  Cardiologist:  Arvid Right [facility]  Chief Complaint  Patient presents with  . Other    Follow up post ECHO. Patient c.o SOB. Meds reviewed verbally with patient.      History of Present Illness:    Jenny Cooper is a 84 y.o. female  past medical history of CAD  s/p remote history of MI in 2002  2 vessel CABG post stenting in 2002,  mitral valve repair in 2002 secondary to mitral regurgitation,  PAF TEE/DCCV in 2013 for her PAF. atypical atrial flutter s/p ablation on 07/27/2013  atrial fibrillation and typical atrial flutter ablation [facility]11/2017[facility] s/p MDT PPM,   COPD,  HTN,   HLD  [facility] on 04/01/15 with increased SOB, PNA, elevated troponin.  Quit smoking in 2001 carcinoma of right lung, has completed chemotherapy  falls, and chronic back pain from collapsed vertebrae Suffered pelvic fracture after fall in September 2017 She presents today to the clinic for follow-up of her atrial fibrillation and CAD  In follow-up today she presents with her son She reports having Chronic sinus issue, may/June did ABX Still blowing out suff, Also coughing stuff out of her lungs, yellow color Concern for chronic bronchitis  On chronic oxygen several liters, frequently with hypoxia/desaturations Uses her nebulizers to help loosen up the secretions Has not seemed to get better since several months ago Did not get better with doxycycline per the patient  On lasix 20 BID Ankle swelling, no ABD swelling Has had dramatic weight loss close to 11 pounds per our scales Weight at home 144 pounds, 147 pounds here  Labs : February 11, 2020 CR 1.65, BUN 29, above her baseline Has had several pound weight loss since that time  Denies any orthostasis  symptoms  Followed by [facility] EP Records reviewed She underwent cardioversion December 18, 2019, recurrent symptoms Jan 13, 2020, seen by [facility] again Jan 22, 2020, pacemaker changes made Metoprolol increased to 50 twice daily [facility] increase Lasix up to 40 twice daily for weight gain  Echocardiogram March 06, 2020 results reviewed with her on today's visit Moderate pulmonary hypertension, normal LV function  Patient declined EKG on today's visit She is hoping not to go to the emergency room, ideally would like to have it taken care of in [place] by EP. Discussed with her that they are not available in [place] today   Other past medical history reviewed CXR 07/2019  Stable areas of fibrosis and scarring with scarring primarily present in the right perihilar and posterior mid lung regions. There is also scarring in the right base with rather minimal right pleural Effusion.  On a prior clinic office she was in atrial flutter with rapid rate Started on amiodarone after discussion with [facility] EP She had worsening shortness of breath, presented to the emergency room Admitted to the hospital with Cardioversion, 04/08/17 Lasix IV for management of her CHF  Echocardiogram showing EF 30 to 35%, moderately elevated RVSP  Cardioversion at the end of May 2018 for atrial flutter Since that time she has felt relatively well, recent vacation in [place] the past week Reports that she did not feel right at times but unable to put her finger on close going  on  CT scan September 2016 showing improvement of her lung cancer She finished her cancer treatment over the summer 2016, she had chemotherapy and radiation  TEE/DCCV in 2013 for her PAF. In 2014 she was diagnosed with atypical atrial flutter and underwent ablation. underwent PPM generator change in 2013.   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  finished concurrent chemoradiation with  carboplatinum and Taxol and PET scan showed significant response.   Echo showed EF 30-35%, severe anterior and infero/posterior wall HK. Left ventricular function parameters were normal, mild to moderate MR. Left atrium was mildly dilated. RV systolic function was normal. Mild to moderate TR. PASP was moderately to severely elevated at 60 mm Hg.   outpatient cardiac cath on 04/28/2015 that showed 3 vessel CAD with patent SVG to LAD, occluded SVG to OM, native RCA had severe ISR in the mid-segment and was occluded distally at the sites of previously placed stents. There were left to right collaterals. Moderately reduced LVSF with EF of 35-40%. Mildly elevated LVEDP. There were no good targets for revascularization. medical therapy.    Prior CV studies:   The following studies were reviewed today:    Past Medical History:  Diagnosis Date  . Acute midline low back pain without sciatica 08/13/2016  . Acute on chronic respiratory failure with hypoxia ([name]) 08/21/2018  . Acute respiratory failure with hypoxia ([place]) 12/11/2017  . Allergy   . Atypical atrial flutter ([place])    a. s/p ablation 07/27/2013 followed by Dr. Rockey Situ  . Balance problem   . CAD (coronary artery disease)    a. s/p MI x 2 in 2002 s/p PCI x 2 in 2002; b. s/p 2v CABG 2002; c. stress echo 07/2004 w/ evi of pos & inf infarct & no evi of ischemia; d. 4/08 dipyridamole scan w/ multiple areas of infarct, no ischemia, EF 49%; e. cath 04/28/15 3v CAD, med Rx rec, no targets for revasc, LM lum irregs, pLAD 30%, 100%, ost-pLCx 60%, [name] 99%, OM2 100%, p-mRCA 90%, [name] 100% L-R collats, VG-mLAD irregs, VG-OM2 oc  . Carcinoma of right lung ([place]) 01/03/2015   a. followed by Dr. Oliva Bustard  . Chronic systolic CHF (congestive heart failure) ([place])    a. echo 03/2015: EF 30-35%, sev ant/inf/pos HK, in mild to mod MR  . Complication of anesthesia    more recently patient oxygen levels do not rebound as quickly  . Compressed spine fracture ([place])  08/06/2016   lumbar 2, t11, t12  . COPD (chronic obstructive pulmonary disease) ([place])   . Fracture of multiple pubic rami, right, closed, initial encounter ([place]) 05/30/2016   05/27/16 from a fall.  . Fractured pelvis ([place]) 05/26/2016   2 places  . GERD (gastroesophageal reflux disease)   . History of blood clots    12/2001 left leg  . History of colonoscopy 2013  . History of mammography, screening 2015  . History of Papanicolaou smear of cervix 2013  . HLD (hyperlipidemia)   . HTN (hypertension)   . Hypothyroidism   . Lung cancer ([place])   . Mitral regurgitation    a. s/p mitral ring placement 09/2000; b. echo 09/2010: EF 50%, inf HK, post AK, mild MR, prosthetic mitral valve ring w/ peak gradient of 10 mmHg; b. echo 2/13: EF 50%, mild MR/TR     . Myocardial infarction ([place])    X 2 (LAST ONE IN 2002)  . Neuropathy   . Pacemaker  a. MDT 2002; b. generator replacement 2013; c. followed by Dr. Omelia Blackwater, MD  . PAF (paroxysmal atrial fibrillation) ([place])    a. on Eliquis   . Personal history of chemotherapy   . Personal history of radiation therapy   . Pneumonia   . Type II diabetes mellitus with complication ([place]) 02/23/9484   Past Surgical History:  Procedure Laterality Date  . ABLATION  04/2016   [facility]  . APPENDECTOMY    . CARDIAC CATHETERIZATION N/A 04/28/2015   Procedure: Left Heart Cath and Coronary Angiography;  Surgeon: Wellington Hampshire, MD;  Location: [place] [facility];  Service: Cardiovascular;  Laterality: N/A;  . CARDIOVERSION N/A 04/08/2017   Procedure: CARDIOVERSION;  Surgeon: Wellington Hampshire, MD;  Location: [facility] ORS;  Service: Cardiovascular;  Laterality: N/A;  . CHEST TUBE INSERTION N/A 07/11/2018   Procedure: PLEURX CATH INSERTION;  Surgeon: [name], MD;  Location: [facility] ORS;  Service: Thoracic;  Laterality: N/A;  . COLONOSCOPY  12/2009   2 small tubular adenomas  . CORONARY ARTERY BYPASS GRAFT  09/2000  . DRAIN REMOVAL Right 08/10/2018   Procedure: DRAIN  REMOVAL;  Surgeon: [name], MD;  Location: [facility] ORS;  Service: General;  Laterality: Right;  . ECTOPIC PREGNANCY SURGERY    . ELECTROMAGNETIC NAVIGATION BROCHOSCOPY N/A 10/06/2015   Procedure: ELECTROMAGNETIC NAVIGATION BRONCHOSCOPY;  Surgeon: Flora Lipps, MD;  Location: [facility] ORS;  Service: Cardiopulmonary;  Laterality: N/A;  . ENDOBRONCHIAL ULTRASOUND N/A 10/06/2015   Procedure: ENDOBRONCHIAL ULTRASOUND;  Surgeon: Flora Lipps, MD;  Location: [facility] ORS;  Service: Cardiopulmonary;  Laterality: N/A;  . FLEXIBLE BRONCHOSCOPY Right 07/11/2018   Procedure: FLEXIBLE BRONCHOSCOPY;  Surgeon: [name], MD;  Location: [facility] ORS;  Service: Thoracic;  Laterality: Right;  . KYPHOPLASTY N/A 09/28/2016   Procedure: KYPHOPLASTY;  Surgeon: Hessie Knows, MD;  Location: [facility] ORS;  Service: Orthopedics;  Laterality: N/A;  . KYPHOPLASTY N/A 02/20/2018   Procedure: [id number];  Surgeon: Hessie Knows, MD;  Location: [facility] ORS;  Service: Orthopedics;  Laterality: N/A;  . PACEMAKER INSERTION  03/2012  . thorocentesis  12/24/2016  . VAGINAL HYSTERECTOMY     partial - left ovary remains  . VIDEO ASSISTED THORACOSCOPY Right 07/11/2018   Procedure: VIDEO ASSISTED THORACOSCOPY;  Surgeon: [name], MD;  Location: [facility] ORS;  Service: Thoracic;  Laterality: Right;  Marland Kitchen VIDEO ASSISTED THORACOSCOPY (VATS) W/TALC PLEUADESIS Right 07/11/2018   Procedure: THORACOTOMY PLEURAL BIOPSY WITH TALC PLEURODESIS;  Surgeon: [name], MD;  Location: [facility] ORS;  Service: Thoracic;  Laterality: Right;  pleural biopsy     Current Meds  Medication Sig  . acetaminophen (TYLENOL) 500 MG tablet Take 1,000 mg by mouth every 6 (six) hours as needed for mild pain or moderate pain.   Marland Kitchen amiodarone (PACERONE) 200 MG tablet Take 1 tablet (200 mg total) by mouth 2 (two) times daily.  . budesonide (PULMICORT) 0.5 MG/2ML nebulizer solution INHALE 2 MILLILITERS BY NEBULIZER 2 TIMES DAILY  . cetirizine (ZYRTEC) 10 MG tablet TAKE (1) TABLET BY  MOUTH EVERY DAY (Patient taking differently: Take 10 mg by mouth daily. )  . ELIQUIS 5 MG TABS tablet TAKE (1) TABLET BY MOUTH TWICE DAILY  . ENTRESTO 24-26 MG TAKE (1) TABLET BY MOUTH TWICE DAILY  . fluticasone (FLONASE) 50 MCG/ACT nasal spray Place 2 sprays into both nostrils daily.  . furosemide (LASIX) 20 MG tablet Take 20 mg by mouth 2 (two) times daily.  Marland Kitchen guaiFENesin (MUCINEX) 600 MG 12 hr tablet Take 1 tablet (600 mg total)  by mouth 2 (two) times daily as needed.  Marland Kitchen ipratropium-albuterol (DUONEB) 0.5-2.5 (3) MG/3ML SOLN TAKE 3 MLS BY NEBULIZATION EVERY 6 HOURSAS NEEDED FOR WHEEZING OR SHORTNESS OF BREATH  . KLOR-CON M20 20 MEQ tablet Take 20 mEq by mouth daily.  Marland Kitchen levothyroxine (SYNTHROID) 88 MCG tablet Take 1 tablet (88 mcg total) by mouth daily before breakfast.  . metoprolol succinate (TOPROL-XL) 25 MG 24 hr tablet Take 25 mg by mouth in the morning and at bedtime.  . Multiple Vitamins-Minerals (CENTRUM SILVER PO) Take 1 tablet by mouth daily.  . OXYGEN Inhale 3 L into the lungs.   . rosuvastatin (CRESTOR) 5 MG tablet TAKE ONE (1) TABLET BY MOUTH ONCE DAILY **NEED APPOINTMENT FOR REFILLS**  . senna (SENOKOT) 8.6 MG TABS tablet Take 1 tablet (8.6 mg total) by mouth daily.  Maretta Bees 175 MCG/3ML SOLN Use one vial in nebulizer once daily.  Generic: revefenacin     Allergies:   Cefdinir and Lovenox [enoxaparin sodium]   Social History   Tobacco Use  . Smoking status: Former Smoker    Packs/day: 1.00    Years: 40.00    Pack years: 40.00    Types: Cigarettes    Quit date: 08/30/2000    Years since quitting: 19.5  . Smokeless tobacco: Never Used  . Tobacco comment: quit smoking in 08/28/2000. Smoking cessation materials not required  Vaping Use  . Vaping Use: Never used  Substance Use Topics  . Alcohol use: Yes    Alcohol/week: 10.0 standard drinks    Types: 10 Glasses of wine per week    Comment: 1 glass of wine per day  . Drug use: No     Family Hx: The patient's family  history includes COPD in her brother, mother, and sister; Colon cancer in her brother; Hypertension in her brother and sister; Lung disease in her father; Stroke in her maternal grandmother. There is no history of Heart attack or Breast cancer.  ROS:   Please see the history of present illness.    Review of Systems  Constitutional: Negative.   HENT: Negative.   Respiratory: Positive for cough and shortness of breath.   Cardiovascular: Negative.   Gastrointestinal: Negative.   Musculoskeletal: Negative.   Neurological: Negative.   Psychiatric/Behavioral: Negative.   All other systems reviewed and are negative.    Labs/Other Tests and Data Reviewed:    Recent Labs: 09/28/2019: Platelets 288 01/22/2020: ALT 12; Hemoglobin 10.3; TSH 10.04 02/11/2020: B Natriuretic Peptide 2,112.1; BUN 29; Creatinine, Ser 1.65; Potassium 4.6; Sodium 139   Recent Lipid Panel Lab Results  Component Value Date/Time   CHOL 131 08/25/2017 09:37 AM   TRIG 159 (H) 08/25/2017 09:37 AM   HDL 36 (L) 08/25/2017 09:37 AM   CHOLHDL 3.6 08/25/2017 09:37 AM   LDLCALC 63 08/25/2017 09:37 AM    Wt Readings from Last 3 Encounters:  03/10/20 147 lb (66.7 kg)  02/11/20 152 lb 3.2 oz (69 kg)  01/29/20 158 lb 2 oz (71.7 kg)     Exam:    Vital Signs: Vital signs may also be detailed in the HPI BP (!) 110/50 (BP Location: Right Arm, Patient Position: Sitting, Cuff Size: Normal)   Pulse 70   Ht _0  (1.676 m)   Wt 147 lb (66.7 kg)   BMI 23.73 kg/m   Constitutional:  oriented to person, place, and time. No distress.  Presents in wheelchair on nasal cannula oxygen HENT:  Head: Grossly normal Eyes:  no discharge.  No scleral icterus.  Neck: No JVD, no carotid bruits  Cardiovascular: Regular rate and rhythm, no murmurs appreciated Pulmonary/Chest: Scattered Rales otherwise clear Abdominal: Soft.  no distension.  no tenderness.  Musculoskeletal: Normal range of motion Neurological:  normal muscle tone.  Coordination normal. No atrophy Skin: Skin warm and dry Psychiatric: normal affect, pleasant   ASSESSMENT & PLAN:    PAF (paroxysmal atrial fibrillation) (HCC)  Chronic obstructive pulmonary disease with acute exacerbation (HCC)  Chronic combined systolic and diastolic CHF (congestive heart failure) (HCC)  Typical atrial flutter (HCC)  Coronary artery disease involving coronary bypass graft of native heart with angina pectoris [facility])  Pulmonary hypertension, unspecified ([place])  Dilated cardiomyopathy ([place])   Atrial flutter Patient declined EKG on today's visit Normal sinus rhythm in June 2021 Followed by [facility] EP Cardioversion April 2021  Hypotension Chronically low, has been low also in the setting of arrhythmia Low but stable on today's visit, she denies any orthostasis symptoms  Pulmonary hypertension Moderate elevated heart pressures on echo, These pressures were obtained after aggressive diuresis, drop in her weight, bump in her creatinine and BUN She has had additional weight loss since that time Recommend we need to back off to some degree on the Lasix, she has been very strict with her fluid intake Recommended Lasix 20 twice daily alternating with 20 daily Goal weight likely in the high 140s to 150 on her scale Current weight today 144 pounds  Paroxysmal atrial fibrillation (HCC) -atrial flutter Previous ablation at [facility] On anticoagulation Clinically in normal sinus rhythm, declined EKG on today's visit  Hyperlipidemia - No changes to her medications  Chronic diastolic CHF Long discussion concerning management of pulmonary hypertension and diastolic CHF, exacerbated by underlying COPD Currently weight down, creatinine BUN elevated concerning for prerenal state Cut back on Lasix as above She has been very strict with her fluid intake  DM type 2, goal A1c below 7 Managed through diet restriction and she is unable to exercise Weight stable  COPD  exacerbation ([place] previously treated with prednisone  Not uncommon to her have frequent episodes of hypoxia despite aggressive diuresis weight dropped prerenal state Suspect underlying bronchitis She reports chronic cough yellow sputum despite being treated with doxycycline several months ago She may need additional round of antibiotics We will defer to pulmonary  Chronic back pain Management per orthopedics    Total encounter time more than 25 minutes  Greater than 50% was spent in counseling and coordination of care with the patient   Signed, Ida Rogue, MD  03/10/2020 4:03 PM    [facility] [id number], [place], [place] [address]

## 2020-03-10 ENCOUNTER — Ambulatory Visit (INDEPENDENT_AMBULATORY_CARE_PROVIDER_SITE_OTHER): Payer: Medicare Other | Admitting: Cardiovascular Disease

## 2020-03-10 ENCOUNTER — Other Ambulatory Visit: Payer: Self-pay | Admitting: Cardiovascular Disease

## 2020-03-10 ENCOUNTER — Other Ambulatory Visit: Payer: Self-pay

## 2020-03-10 ENCOUNTER — Encounter: Payer: Self-pay | Admitting: Cardiovascular Disease

## 2020-03-10 VITALS — BP 110/50 | HR 70 | Ht 66.0 in | Wt 147.0 lb

## 2020-03-10 DIAGNOSIS — I483 Typical atrial flutter: Secondary | ICD-10-CM

## 2020-03-10 DIAGNOSIS — I5042 Chronic combined systolic (congestive) and diastolic (congestive) heart failure: Secondary | ICD-10-CM

## 2020-03-10 DIAGNOSIS — I480 Paroxysmal atrial fibrillation: Secondary | ICD-10-CM | POA: Diagnosis not present

## 2020-03-10 DIAGNOSIS — I272 Pulmonary hypertension, unspecified: Secondary | ICD-10-CM

## 2020-03-10 DIAGNOSIS — I25709 Atherosclerosis of coronary artery bypass graft(s), unspecified, with unspecified angina pectoris: Secondary | ICD-10-CM | POA: Diagnosis not present

## 2020-03-10 DIAGNOSIS — I420 Dilated cardiomyopathy: Secondary | ICD-10-CM

## 2020-03-10 DIAGNOSIS — J441 Chronic obstructive pulmonary disease with (acute) exacerbation: Secondary | ICD-10-CM | POA: Diagnosis not present

## 2020-03-10 MED ORDER — FUROSEMIDE 20 MG PO TABS: 20.0000 mg | ORAL_TABLET | ORAL | 0 refills | Status: DC

## 2020-03-10 MED ORDER — FUROSEMIDE 20 MG PO TABS: 20.0000 mg | ORAL_TABLET | ORAL | 3 refills | Status: DC

## 2020-03-10 NOTE — Patient Instructions (Addendum)
Medication Instructions:  Please decrease the lasix down to twice a day alternating with once a day  Goal weight high 140s to 150 pounds  If you need a refill on your cardiac medications before your next appointment, please call your pharmacy.    Lab work: No new labs needed   If you have labs (blood work) drawn today and your tests are completely normal, you will receive your results only by: Marland Kitchen MyChart Message (if you have MyChart) OR . A paper copy in the mail If you have any lab test that is abnormal or we need to change your treatment, we will call you to review the results.   Testing/Procedures: No new testing needed   Follow-Up: At [facility], you and your health needs are our priority.  As part of our continuing mission to provide you with exceptional heart care, we have created designated Provider Care Teams.  These Care Teams include your primary Cardiologist (physician) and Advanced Practice Providers (APPs -  Physician Assistants and Nurse Practitioners) who all work together to provide you with the care you need, when you need it.  . You will need a follow up appointment in 3 months   . Providers on your designated Care Team:   . Murray Hodgkins, NP . Christell Faith, PA-C . Marrianne Mood, PA-C  Any Other Special Instructions Will Be Listed Below (If Applicable).  COVID-19 Vaccine Information can be found at: ShippingScam.co.uk For questions related to vaccine distribution or appointments, please email vaccine@ .com or call [phone].

## 2020-03-11 ENCOUNTER — Other Ambulatory Visit: Payer: Self-pay | Admitting: Cardiovascular Disease

## 2020-03-18 ENCOUNTER — Encounter: Payer: Self-pay | Admitting: Internal Medicine

## 2020-03-20 ENCOUNTER — Other Ambulatory Visit: Payer: Self-pay | Admitting: Internal Medicine

## 2020-04-03 ENCOUNTER — Other Ambulatory Visit: Payer: Self-pay | Admitting: Internal Medicine

## 2020-04-10 ENCOUNTER — Other Ambulatory Visit: Payer: Self-pay | Admitting: Cardiovascular Disease

## 2020-05-02 ENCOUNTER — Telehealth: Payer: Self-pay | Admitting: Internal Medicine

## 2020-05-02 MED ORDER — PREDNISONE 10 MG PO TABS: ORAL_TABLET | ORAL | 0 refills | Status: DC

## 2020-05-02 MED ORDER — DOXYCYCLINE HYCLATE 100 MG PO TABS: 100.0000 mg | ORAL_TABLET | Freq: Two times a day (BID) | ORAL | 0 refills | Status: DC

## 2020-05-02 NOTE — Telephone Encounter (Signed)
Ok thank you. I will send in prednisone taper and doxycyline for COPD exacerbation.

## 2020-05-02 NOTE — Telephone Encounter (Signed)
Spoke with patient regarding prior message. Patient stated she is still using her Pulmicort and Yupelri and she also does take her Duoneb. Patient did decline any weight gain or leg swelling. Beth can you please advise. Thank you

## 2020-05-02 NOTE — Telephone Encounter (Signed)
Spoke with pt, aware of recs.  pred taper has been sent to pharmacy.  Nothing further needed at this time- will close encounter.

## 2020-05-02 NOTE — Telephone Encounter (Signed)
Spoke with the pt  She is c/o increased cough x 3 wks  She states cough is less prod than usual for her and sputum is clear  She is having some chest tightness  SOB is unchanged since last visit with Beth on 02/11/20  She denies any f/c/s, body aches  Has had covid vaccine  I scheduled her f/u with [facility] since they opened his Oct schedule- Oct 5  Please advise thanks

## 2020-05-02 NOTE — Telephone Encounter (Signed)
Attempted to call patient, no answer.  Is she still taking Pulmicort and Yupelri? Has she tried using Duoneb in addition? Any weight gain or leg swelling?

## 2020-05-13 ENCOUNTER — Ambulatory Visit: Payer: Medicare Other | Admitting: Cardiovascular Disease

## 2020-05-28 NOTE — Telephone Encounter (Signed)
Dr. Mortimer Fries, please advise.

## 2020-05-31 ENCOUNTER — Other Ambulatory Visit: Payer: Self-pay | Admitting: Cardiovascular Disease

## 2020-06-02 ENCOUNTER — Other Ambulatory Visit: Payer: Self-pay

## 2020-06-02 ENCOUNTER — Ambulatory Visit (INDEPENDENT_AMBULATORY_CARE_PROVIDER_SITE_OTHER): Payer: Medicare Other

## 2020-06-02 VITALS — BP 102/62 | HR 76 | Temp 97.5°F | Resp 16 | Ht 66.0 in | Wt 138.0 lb

## 2020-06-02 DIAGNOSIS — Z23 Encounter for immunization: Secondary | ICD-10-CM | POA: Diagnosis not present

## 2020-06-02 DIAGNOSIS — Z0000 Encounter for general adult medical examination without abnormal findings: Secondary | ICD-10-CM | POA: Diagnosis not present

## 2020-06-02 NOTE — Progress Notes (Signed)
Subjective:   Jenny Cooper is a 84 y.o. female who presents for Medicare Annual (Subsequent) preventive examination.   Review of Systems     Cardiac Risk Factors include: advanced age (>40mn, >>61women);dyslipidemia;hypertension;sedentary lifestyle     Objective:    Today's Vitals   06/02/20 1051  BP: 102/62  Pulse: 76  Resp: 16  Temp: (!) 97.5 F (36.4 C)  TempSrc: Oral  SpO2: 90%  Weight: 138 lb (62.6 kg)  Height: _0  (1.676 m)   Body mass index is 22.27 kg/m.  Advanced Directives 06/02/2020 10/15/2019 07/12/2019 05/30/2019 10/17/2018 08/21/2018 08/21/2018  Does Patient Have a Medical Advance Directive? Yes Yes Yes Yes - Yes Yes  Type of Advance Directive [place] will [place]Living will [place]isonLiving will HP[place]Living will - Healthcare Power of [place] Does patient want to make changes to medical advance directive? - - - - - No - Patient declined No - Patient declined  Copy of [place] Chart? No - copy requested - - No - copy requested - No - copy requested No - copy requested  Would patient like information on creating a medical advance directive? - - - - No - Patient declined No - Patient declined -    Current Medications (verified) Outpatient Encounter Medications as of 06/02/2020  Medication Sig  . acetaminophen (TYLENOL) 500 MG tablet Take 1,000 mg by mouth every 6 (six) hours as needed for mild pain or moderate pain.   . budesonide (PULMICORT) 0.5 MG/2ML nebulizer solution INHALE 2 MILLILITERS BY NEBULIZER 2 TIMES DAILY  . cetirizine (ZYRTEC) 10 MG tablet TAKE (1) TABLET BY MOUTH EVERY DAY (Patient taking differently: Take 10 mg by mouth daily. )  . ELIQUIS 5 MG TABS tablet TAKE (1) TABLET BY MOUTH TWICE DAILY  . ENTRESTO 24-26 MG TAKE (1) TABLET BY MOUTH TWICE DAILY  . fluticasone (FLONASE) 50 MCG/ACT nasal spray  Place 2 sprays into both nostrils daily.  . furosemide (LASIX) 20 MG tablet Take 1 tablet (20 mg total) by mouth as directed. Decrease to 1 tablet twice a day alternating with once a day and extra tablet as needed for swelling.  .Marland KitchenguaiFENesin (MUCINEX) 600 MG 12 hr tablet Take 1 tablet (600 mg total) by mouth 2 (two) times daily as needed.  .Marland Kitchenipratropium-albuterol (DUONEB) 0.5-2.5 (3) MG/3ML SOLN TAKE 3 MLS BY NEBULIZATION EVERY 6 HOURSAS NEEDED FOR WHEEZING OR SHORTNESS OF BREATH  . levothyroxine (SYNTHROID) 88 MCG tablet Take 1 tablet (88 mcg total) by mouth daily before breakfast.  . metoprolol succinate (TOPROL-XL) 25 MG 24 hr tablet Take 25 mg by mouth in the morning and at bedtime.  . Multiple Vitamins-Minerals (CENTRUM SILVER PO) Take 1 tablet by mouth daily.  . OXYGEN Inhale 3 L into the lungs.   . rosuvastatin (CRESTOR) 5 MG tablet TAKE ONE (1) TABLET BY MOUTH ONCE DAILY  . senna (SENOKOT) 8.6 MG TABS tablet Take 1 tablet (8.6 mg total) by mouth daily.  .Maretta Bees175 MCG/3ML nebulizer solution Use one vial in nebulizer once daily. Do not mix with other nebulized medications.  . [DISCONTINUED] amiodarone (PACERONE) 200 MG tablet Take 1 tablet (200 mg total) by mouth 2 (two) times daily.  . [DISCONTINUED] doxycycline (VIBRA-TABS) 100 MG tablet Take 1 tablet (100 mg total) by mouth 2 (two) times daily.  . [DISCONTINUED] KLOR-CON M20 20 MEQ tablet Take 20 mEq  by mouth daily.  . [DISCONTINUED] predniSONE (DELTASONE) 10 MG tablet Take 4 tabs po daily x 2 days; then 3 tabs for 2 days; then 2 tabs for 2 days; then 1 tab for 2 days   Facility-Administered Encounter Medications as of 06/02/2020  Medication  . sodium chloride 0.9 % injection 10 mL  . sodium chloride flush (NS) 0.9 % injection 10 mL    Allergies (verified) Cefdinir and Lovenox [enoxaparin sodium]   History: Past Medical History:  Diagnosis Date  . Acute midline low back pain without sciatica 08/13/2016  . Acute on chronic  respiratory failure with hypoxia ([place]) 08/21/2018  . Acute respiratory failure with hypoxia ([place]) 12/11/2017  . Allergy   . Atypical atrial flutter ([place])    a. s/p ablation 07/27/2013 followed by Dr. Rockey Situ  . Balance problem   . CAD (coronary artery disease)    a. s/p MI x 2 in 2002 s/p PCI x 2 in 2002; b. s/p 2v CABG 2002; c. stress echo 07/2004 w/ evi of pos & inf infarct & no evi of ischemia; d. 4/08 dipyridamole scan w/ multiple areas of infarct, no ischemia, EF 49%; e. cath 04/28/15 3v CAD, med Rx rec, no targets for revasc, LM lum irregs, pLAD 30%, 100%, ost-pLCx 60%, [name] 99%, OM2 100%, p-mRCA 90%, [name] 100% L-R collats, VG-mLAD irregs, VG-OM2 oc  . Carcinoma of right lung ([place]) 01/03/2015   a. followed by Dr. Oliva Bustard  . Chronic systolic CHF (congestive heart failure) ([place])    a. echo 03/2015: EF 30-35%, sev ant/inf/pos HK, in mild to mod MR  . Complication of anesthesia    more recently patient oxygen levels do not rebound as quickly  . Compressed spine fracture ([place]) 08/06/2016   lumbar 2, t11, t12  . COPD (chronic obstructive pulmonary disease) ([place])   . Fracture of multiple pubic rami, right, closed, initial encounter ([place]) 05/30/2016   05/27/16 from a fall.  . Fractured pelvis ([place]) 05/26/2016   2 places  . GERD (gastroesophageal reflux disease)   . History of blood clots    12/2001 left leg  . History of colonoscopy 2013  . History of mammography, screening 2015  . History of Papanicolaou smear of cervix 2013  . HLD (hyperlipidemia)   . HTN (hypertension)   . Hypothyroidism   . Lung cancer ([place])   . Mitral regurgitation    a. s/p mitral ring placement 09/2000; b. echo 09/2010: EF 50%, inf HK, post AK, mild MR, prosthetic mitral valve ring w/ peak gradient of 10 mmHg; b. echo 2/13: EF 50%, mild MR/TR     . Myocardial infarction ([place])    X 2 (LAST ONE IN 2002)  . Neuropathy   . Pacemaker    a. MDT 2002; b. generator replacement 2013; c. followed by Dr. Omelia Blackwater, MD  . PAF  (paroxysmal atrial fibrillation) ([place])    a. on Eliquis   . Personal history of chemotherapy   . Personal history of radiation therapy   . Pneumonia   . Type II diabetes mellitus with complication ([place]) 01/24/7823   Past Surgical History:  Procedure Laterality Date  . ABLATION  04/2016   [facility]  . APPENDECTOMY    . CARDIAC CATHETERIZATION N/A 04/28/2015   Procedure: Left Heart Cath and Coronary Angiography;  Surgeon: Wellington Hampshire, MD;  Location: [facility];  Service: Cardiovascular;  Laterality: N/A;  . CARDIOVERSION N/A 04/08/2017   Procedure: CARDIOVERSION;  Surgeon: Wellington Hampshire, MD;  Location:  [facility];  Service: Cardiovascular;  Laterality: N/A;  . CHEST TUBE INSERTION N/A 07/11/2018   Procedure: PLEURX CATH INSERTION;  Surgeon: [name], MD;  Location: [facility];  Service: Thoracic;  Laterality: N/A;  . COLONOSCOPY  12/2009   2 small tubular adenomas  . CORONARY ARTERY BYPASS GRAFT  09/2000  . DRAIN REMOVAL Right 08/10/2018   Procedure: DRAIN REMOVAL;  Surgeon: [name], MD;  Location: [facility];  Service: General;  Laterality: Right;  . ECTOPIC PREGNANCY SURGERY    . ELECTROMAGNETIC NAVIGATION BROCHOSCOPY N/A 10/06/2015   Procedure: ELECTROMAGNETIC NAVIGATION BRONCHOSCOPY;  Surgeon: Flora Lipps, MD;  Location: [facility];  Service: Cardiopulmonary;  Laterality: N/A;  . ENDOBRONCHIAL ULTRASOUND N/A 10/06/2015   Procedure: ENDOBRONCHIAL ULTRASOUND;  Surgeon: Flora Lipps, MD;  Location: [facility];  Service: Cardiopulmonary;  Laterality: N/A;  . FLEXIBLE BRONCHOSCOPY Right 07/11/2018   Procedure: FLEXIBLE BRONCHOSCOPY;  Surgeon: [name], MD;  Location: [facility];  Service: Thoracic;  Laterality: Right;  . KYPHOPLASTY N/A 09/28/2016   Procedure: KYPHOPLASTY;  Surgeon: Hessie Knows, MD;  Location: [facility];  Service: Orthopedics;  Laterality: N/A;  . KYPHOPLASTY N/A 02/20/2018   Procedure: [id number];  Surgeon: Hessie Knows, MD;  Location: [facility];  Service:  Orthopedics;  Laterality: N/A;  . PACEMAKER INSERTION  03/2012  . thorocentesis  12/24/2016  . VAGINAL HYSTERECTOMY     partial - left ovary remains  . VIDEO ASSISTED THORACOSCOPY Right 07/11/2018   Procedure: VIDEO ASSISTED THORACOSCOPY;  Surgeon: [name], MD;  Location: [facility];  Service: Thoracic;  Laterality: Right;  Marland Kitchen VIDEO ASSISTED THORACOSCOPY (VATS) W/TALC PLEUADESIS Right 07/11/2018   Procedure: THORACOTOMY PLEURAL BIOPSY WITH TALC PLEURODESIS;  Surgeon: [name], MD;  Location: [facility];  Service: Thoracic;  Laterality: Right;  pleural biopsy   Family History  Problem Relation Age of Onset  . COPD Mother        sister, and brother  . Lung disease Father   . Stroke Maternal Grandmother   . Hypertension Sister   . COPD Sister   . Hypertension Brother   . COPD Brother   . Colon cancer Brother   . Heart attack Neg Hx   . Breast cancer Neg Hx    Social History   Socioeconomic History  . Marital status: Widowed    Spouse name: Not on file  . Number of children: 6  . Years of education: Not on file  . Highest education level: Some college, no degree  Occupational History  . Occupation: Retired  Tobacco Use  . Smoking status: Former Smoker    Packs/day: 1.00    Years: 40.00    Pack years: 40.00    Types: Cigarettes    Quit date: 08/30/2000    Years since quitting: 19.7  . Smokeless tobacco: Never Used  . Tobacco comment: quit smoking in 08/28/2000. Smoking cessation materials not required  Vaping Use  . Vaping Use: Never used  Substance and Sexual Activity  . Alcohol use: Yes    Alcohol/week: 10.0 standard drinks    Types: 10 Glasses of wine per week    Comment: 1 glass of wine per day  . Drug use: No  . Sexual activity: Never  Other Topics Concern  . Not on file  Social History Narrative   Pt's son lives with her   Social Determinants of Health   Financial Resource Strain: Low Risk   . Difficulty of Paying Living Expenses: Not very hard  Food  Insecurity: No  Food Insecurity  . Worried About Charity fundraiser in the Last Year: Never true  . Ran Out of Food in the Last Year: Never true  Transportation Needs: No Transportation Needs  . Lack of Transportation (Medical): No  . Lack of Transportation (Non-Medical): No  Physical Activity: Inactive  . Days of Exercise per Week: 0 days  . Minutes of Exercise per Session: 0 min  Stress: No Stress Concern Present  . Feeling of Stress : Not at all  Social Connections: Socially Isolated  . Frequency of Communication with Friends and Family: More than three times a week  . Frequency of Social Gatherings with Friends and Family: Once a week  . Attends Religious Services: Never  . Active Member of Clubs or Organizations: No  . Attends Archivist Meetings: Never  . Marital Status: Widowed    Tobacco Counseling Counseling given: Not Answered Comment: quit smoking in 08/28/2000. Smoking cessation materials not required   Clinical Intake:  Pre-visit preparation completed: Yes  Pain : No/denies pain     BMI - recorded: 22.27 Nutritional Status: BMI of 19-24  Normal Nutritional Risks: None Diabetes: No  How often do you need to have someone help you when you read instructions, pamphlets, or other written materials from your doctor or pharmacy?: 1 - Never    Interpreter Needed?: No  Information entered by :: Clemetine Marker LPN   Activities of Daily Living In your present state of health, do you have any difficulty performing the following activities: 06/02/2020  Hearing? Y  Comment declines hearing aids  Vision? Y  Comment due for eye exam  Difficulty concentrating or making decisions? N  Walking or climbing stairs? N  Dressing or bathing? N  Doing errands, shopping? N  Preparing Food and eating ? N  Using the Toilet? N  In the past six months, have you accidently leaked urine? Y  Comment wears pads for protection  Do you have problems with loss of bowel  control? N  Managing your Medications? N  Managing your Finances? N  Housekeeping or managing your Housekeeping? N  Some recent data might be hidden    Patient Care Team: Glean Hess, MD as PCP - General (Internal Medicine) Minna Merritts, MD as PCP - Cardiology (Cardiology) Cammie Sickle, MD as Consulting Physician (Internal Medicine) Alisa Graff, FNP as Nurse Practitioner (Cardiology) Flora Lipps, MD as Consulting Physician (Pulmonary Disease) Marvia Pickles, MD as Consulting Physician (Cardiology)  Indicate any recent Medical Services you may have received from other than [facility] providers in the past year (date may be approximate).     Assessment:   This is a routine wellness examination for [place].  Hearing/Vision screen  Hearing Screening   _0  _1  _2  _3  _4  _5  _6  _7  _8   Right ear:           Left ear:           Comments: Pt c/o mild hearing difficulty when someone speaks too fast; declines hearing aids  Vision Screening Comments: Past due for eye exam; declines referral  Dietary issues and exercise activities discussed: Current Exercise Habits: The patient does not participate in regular exercise at present, Exercise limited by: orthopedic condition(s)  Goals    . Cut out extra servings    . DIET - INCREASE WATER INTAKE     Recommend to drink at least 6-8 8oz glasses of water per day    . Prevent falls  Recommend preventing falls with balance and strengthening exercises.       Depression Screen PHQ 2/9 Scores 06/02/2020 01/23/2020 10/19/2019 09/28/2019 05/30/2019 09/15/2017 08/25/2017  PHQ - 2 Score 0 0 0 0 0 0 0  PHQ- 9 Score - 0 - - 0 - -    Fall Risk Fall Risk  06/02/2020 01/23/2020 09/28/2019 05/30/2019 05/30/2019  Falls in the past year? 0 0 0 1 1  Number falls in past yr: 0 0 0 1 1  Injury with Fall? 0 0 0 1 1  Risk for fall due to : Impaired balance/gait;Impaired mobility No Fall Risks History of  fall(s);Impaired balance/gait History of fall(s);Impaired balance/gait Impaired balance/gait;History of fall(s)  Follow up Falls prevention discussed Falls evaluation completed Falls evaluation completed Falls prevention discussed Falls prevention discussed    Any stairs in or around the home? Yes  If so, are there any without handrails? No  Home free of loose throw rugs in walkways, pet beds, electrical cords, etc? Yes  Adequate lighting in your home to reduce risk of falls? Yes   ASSISTIVE DEVICES UTILIZED TO PREVENT FALLS:  Life alert? No  Use of a cane, walker or w/c? Yes  Grab bars in the bathroom? Yes  Shower chair or bench in shower? Yes  Elevated toilet seat or a handicapped toilet? Yes  TIMED UP AND GO:  Was the test performed? No . Patient remained in wheelchair.  Cognitive Function:     6CIT Screen 06/02/2020 05/30/2019 08/25/2017  What Year? 0 points 0 points 0 points  What month? 0 points 0 points 0 points  What time? 0 points 0 points 0 points  Count back from 20 0 points 0 points 0 points  Months in reverse 2 points 2 points 2 points  Repeat phrase 0 points 0 points 0 points  Total Score _0 Immunizations Immunization History  Administered Date(s) Administered  . Fluad Quad(high Dose 65+) 06/02/2020  . Influenza, High Dose Seasonal PF 06/17/2014, 07/04/2018, 07/01/2019  . Influenza,inj,Quad PF,6+ Mos 07/01/2014, 05/28/2015, 05/31/2016, 04/21/2017  . Influenza-Unspecified 07/20/2011, 05/03/2012, 07/01/2014, 05/28/2015, 05/31/2016, 04/21/2017  . Moderna SARS-COVID-2 Vaccination 09/09/2019, 10/10/2019  . Pneumococcal Conjugate-13 04/21/2017  . Pneumococcal Polysaccharide-23 08/03/2011  . Zoster 05/01/2011    TDAP status: Due, Education has been provided regarding the importance of this vaccine. Advised may receive this vaccine at local pharmacy or Health Dept. Aware to provide a copy of the vaccination record if obtained from local pharmacy or Health  Dept. Verbalized acceptance and understanding.   Flu Vaccine status: Completed at today's visit   Pneumococcal vaccine status: Up to date   Covid-19 vaccine status: Completed vaccines  Qualifies for Shingles Vaccine? Yes   Zostavax completed Yes   Shingrix Completed?: No.    Education has been provided regarding the importance of this vaccine. Patient has been advised to call insurance company to determine out of pocket expense if they have not yet received this vaccine. Advised may also receive vaccine at local pharmacy or Health Dept. Verbalized acceptance and understanding.  Screening Tests Health Maintenance  Topic Date Due  . COLONOSCOPY  11/25/2014  . MAMMOGRAM  06/02/2021 (Originally 03/15/2019)  . TETANUS/TDAP  06/02/2021 (Originally 01/23/1955)  . URINE MICROALBUMIN  09/27/2020  . INFLUENZA VACCINE  Completed  . DEXA SCAN  Completed  . COVID-19 Vaccine  Completed  . PNA vac Low Risk Adult  Completed    Health Maintenance  Health Maintenance Due  Topic Date Due  .  COLONOSCOPY  11/25/2014    Colorectal cancer screening: No longer required.    Mammogram status: Completed 03/14/18. Repeat every year. Pt declines repeat screening at this time.   Bone Density status: Completed 01/10/17. Results reflect: Bone density results: OSTEOPENIA. Repeat every 2 years.  Pt declines repeat screening at this time.   Lung Cancer Screening: (Low Dose CT Chest recommended if Age 10-80 years, 30 pack-year currently smoking OR have quit w/in 15years.) does not qualify.    Additional Screening:  Hepatitis C Screening: does not qualify;  Vision Screening: Recommended annual ophthalmology exams for early detection of glaucoma and other disorders of the eye. Is the patient up to date with their annual eye exam?  No  Who is the provider or what is the name of the office in which the patient attends annual eye exams? Not established If pt is not established with a provider, would they like to  be referred to a provider to establish care? No . Pt declined  Dental Screening: Recommended annual dental exams for proper oral hygiene  Community Resource Referral / Chronic Care Management: CRR required this visit?  No   CCM required this visit?  No      Plan:     I have personally reviewed and noted the following in the patient's chart:   . Medical and social history . Use of alcohol, tobacco or illicit drugs  . Current medications and supplements . Functional ability and status . Nutritional status . Physical activity . Advanced directives . List of other physicians . Hospitalizations, surgeries, and ER visits in previous 12 months . Vitals . Screenings to include cognitive, depression, and falls . Referrals and appointments  In addition, I have reviewed and discussed with patient certain preventive protocols, quality metrics, and best practice recommendations. A written personalized care plan for preventive services as well as general preventive health recommendations were provided to patient.     Clemetine Marker, LPN   [id number]   Nurse Notes: pt accompanied by daughter Corrine today. Doing well and appreciative of visit today.

## 2020-06-02 NOTE — Patient Instructions (Signed)
Jenny Cooper , Thank you for taking time to come for your Medicare Wellness Visit. I appreciate your ongoing commitment to your health goals. Please review the following plan we discussed and let me know if I can assist you in the future.   Screening recommendations/referrals: Colonoscopy: no longer required Mammogram: done 03/14/18 Bone Density: done 01/10/17 Recommended yearly ophthalmology/optometry visit for glaucoma screening and checkup Recommended yearly dental visit for hygiene and checkup  Vaccinations: Influenza vaccine: done today Pneumococcal vaccine: done 04/21/17 Tdap vaccine: due Shingles vaccine: Shingrix discussed. Please contact your pharmacy for coverage information.  Covid-19: done 09/09/19 & 10/10/19  Advanced directives: Please bring a copy of your health care power of attorney and living will to the office at your convenience.  Conditions/risks identified: Recommend increasing physical activity   Next appointment: Follow up in one year for your annual wellness visit    Preventive Care 65 Years and Older, Female Preventive care refers to lifestyle choices and visits with your health care provider that can promote health and wellness. What does preventive care include?  A yearly physical exam. This is also called an annual well check.  Dental exams once or twice a year.  Routine eye exams. Ask your health care provider how often you should have your eyes checked.  Personal lifestyle choices, including:  Daily care of your teeth and gums.  Regular physical activity.  Eating a healthy diet.  Avoiding tobacco and drug use.  Limiting alcohol use.  Practicing safe sex.  Taking low-dose aspirin every day.  Taking vitamin and mineral supplements as recommended by your health care provider. What happens during an annual well check? The services and screenings done by your health care provider during your annual well check will depend on your age, overall  health, lifestyle risk factors, and family history of disease. Counseling  Your health care provider may ask you questions about your:  Alcohol use.  Tobacco use.  Drug use.  Emotional well-being.  Home and relationship well-being.  Sexual activity.  Eating habits.  History of falls.  Memory and ability to understand (cognition).  Work and work Statistician.  Reproductive health. Screening  You may have the following tests or measurements:  Height, weight, and BMI.  Blood pressure.  Lipid and cholesterol levels. These may be checked every 5 years, or more frequently if you are over 31 years old.  Skin check.  Lung cancer screening. You may have this screening every year starting at age 82 if you have a 30-pack-year history of smoking and currently smoke or have quit within the past 15 years.  Fecal occult blood test (FOBT) of the stool. You may have this test every year starting at age 82.  Flexible sigmoidoscopy or colonoscopy. You may have a sigmoidoscopy every 5 years or a colonoscopy every 10 years starting at age 82.  Hepatitis C blood test.  Hepatitis B blood test.  Sexually transmitted disease (STD) testing.  Diabetes screening. This is done by checking your blood sugar (glucose) after you have not eaten for a while (fasting). You may have this done every 1-3 years.  Bone density scan. This is done to screen for osteoporosis. You may have this done starting at age 82.  Mammogram. This may be done every 1-2 years. Talk to your health care provider about how often you should have regular mammograms. Talk with your health care provider about your test results, treatment options, and if necessary, the need for more tests. Vaccines  Your health  care provider may recommend certain vaccines, such as:  Influenza vaccine. This is recommended every year.  Tetanus, diphtheria, and acellular pertussis (Tdap, Td) vaccine. You may need a Td booster every 10  years.  Zoster vaccine. You may need this after age 82.  Pneumococcal 13-valent conjugate (PCV13) vaccine. One dose is recommended after age 82.  Pneumococcal polysaccharide (PPSV23) vaccine. One dose is recommended after age 82. Talk to your health care provider about which screenings and vaccines you need and how often you need them. This information is not intended to replace advice given to you by your health care provider. Make sure you discuss any questions you have with your health care provider. Document Released: 09/12/2015 Document Revised: 05/05/2016 Document Reviewed: 06/17/2015 Elsevier Interactive Patient Education  2017 [place] Prevention in the Home Falls can cause injuries. They can happen to people of all ages. There are many things you can do to make your home safe and to help prevent falls. What can I do on the outside of my home?  Regularly fix the edges of walkways and driveways and fix any cracks.  Remove anything that might make you trip as you walk through a door, such as a raised step or threshold.  Trim any bushes or trees on the path to your home.  Use bright outdoor lighting.  Clear any walking paths of anything that might make someone trip, such as rocks or tools.  Regularly check to see if handrails are loose or broken. Make sure that both sides of any steps have handrails.  Any raised decks and porches should have guardrails on the edges.  Have any leaves, snow, or ice cleared regularly.  Use sand or salt on walking paths during winter.  Clean up any spills in your garage right away. This includes oil or grease spills. What can I do in the bathroom?  Use night lights.  Install grab bars by the toilet and in the tub and shower. Do not use towel bars as grab bars.  Use non-skid mats or decals in the tub or shower.  If you need to sit down in the shower, use a plastic, non-slip stool.  Keep the floor dry. Clean up any water that  spills on the floor as soon as it happens.  Remove soap buildup in the tub or shower regularly.  Attach bath mats securely with double-sided non-slip rug tape.  Do not have throw rugs and other things on the floor that can make you trip. What can I do in the bedroom?  Use night lights.  Make sure that you have a light by your bed that is easy to reach.  Do not use any sheets or blankets that are too big for your bed. They should not hang down onto the floor.  Have a firm chair that has side arms. You can use this for support while you get dressed.  Do not have throw rugs and other things on the floor that can make you trip. What can I do in the kitchen?  Clean up any spills right away.  Avoid walking on wet floors.  Keep items that you use a lot in easy-to-reach places.  If you need to reach something above you, use a strong step stool that has a grab bar.  Keep electrical cords out of the way.  Do not use floor polish or wax that makes floors slippery. If you must use wax, use non-skid floor wax.  Do not have  throw rugs and other things on the floor that can make you trip. What can I do with my stairs?  Do not leave any items on the stairs.  Make sure that there are handrails on both sides of the stairs and use them. Fix handrails that are broken or loose. Make sure that handrails are as long as the stairways.  Check any carpeting to make sure that it is firmly attached to the stairs. Fix any carpet that is loose or worn.  Avoid having throw rugs at the top or bottom of the stairs. If you do have throw rugs, attach them to the floor with carpet tape.  Make sure that you have a light switch at the top of the stairs and the bottom of the stairs. If you do not have them, ask someone to add them for you. What else can I do to help prevent falls?  Wear shoes that:  Do not have high heels.  Have rubber bottoms.  Are comfortable and fit you well.  Are closed at the  toe. Do not wear sandals.  If you use a stepladder:  Make sure that it is fully opened. Do not climb a closed stepladder.  Make sure that both sides of the stepladder are locked into place.  Ask someone to hold it for you, if possible.  Clearly mark and make sure that you can see:  Any grab bars or handrails.  First and last steps.  Where the edge of each step is.  Use tools that help you move around (mobility aids) if they are needed. These include:  Canes.  Walkers.  Scooters.  Crutches.  Turn on the lights when you go into a dark area. Replace any light bulbs as soon as they burn out.  Set up your furniture so you have a clear path. Avoid moving your furniture around.  If any of your floors are uneven, fix them.  If there are any pets around you, be aware of where they are.  Review your medicines with your doctor. Some medicines can make you feel dizzy. This can increase your chance of falling. Ask your doctor what other things that you can do to help prevent falls. This information is not intended to replace advice given to you by your health care provider. Make sure you discuss any questions you have with your health care provider. Document Released: 06/12/2009 Document Revised: 01/22/2016 Document Reviewed: 09/20/2014 Elsevier Interactive Patient Education  2017 [employer].

## 2020-06-03 ENCOUNTER — Ambulatory Visit: Payer: Medicare Other | Admitting: Internal Medicine

## 2020-06-16 NOTE — Progress Notes (Signed)
Date:  06/17/2020   ID:  Jenny Cooper, DOB [birth year], MRN [id number]  Patient Location:  [place] [address]   Provider location:   [facility], [place] office  PCP:  Glean Hess, MD  Cardiologist:  Ar[facility] Right [facility]  Chief Complaint  Patient presents with  . Other    3 month follow up. Patient c/o SOB. Meds reviewed verbally with patient.     History of Present Illness:    Jenny Cooper is a 84 y.o. female  past medical history of CAD  s/p remote history of MI in 2002  2 vessel CABG post stenting in 2002,  mitral valve repair in 2002 secondary to mitral regurgitation,  PAF TEE/DCCV in 2013 for her PAF. atypical atrial flutter s/p ablation on 07/27/2013  atrial fibrillation and typical atrial flutter ablation [facility]/2017[facility] s/p MDT PPM,   COPD,  HTN,   HLD  [facility] on 04/01/15 with increased SOB, PNA, elevated troponin.  Quit smoking in 2001 carcinoma of right lung, has completed chemotherapy  falls, and chronic back pain from collapsed vertebrae Suffered pelvic fracture after fall in September 2017 Chronic bronchitis She presents today to the clinic for follow-up of her atrial fibrillation and CAD  On follow-up today she has 2 issues to discuss, 1 is her back pain one is her lung congestion  Back pain, mid back Worse with standing Sometimes excruciating has to sit down She does have history of vertebroplasty Does not see neurosurgery Finds it quite debilitating at times  Pulmonary congestion/bronchitis early sept 21, better with prednisone and ABX, Treated by Geraldo Pitter, 10 days  Symptoms seem to improve, Now feels that symptoms are getting worse again Back again with productive cough, thick, cough day and night, Likely making her chest tight  Continues on Lasix daily , sometimes with an extra Remains on oxygen several liters,  Uses her nebulizers to help loosen up the secretions Sometimes with  Mucinex  EKG personally reviewed by myself on todays visit Shows normal sinus rhythm rate 75 bpm no significant ST-T wave changes  Other past medical history reviewed Followed by [facility] EP She underwent cardioversion December 18, 2019, recurrent symptoms Jan 13, 2020, seen by [facility] again Jan 22, 2020, pacemaker changes made Metoprolol increased to 50 twice daily [facility] increase Lasix up to 40 twice daily for weight gain  Echocardiogram March 06, 2020 results reviewed with her on today's visit Moderate pulmonary hypertension, normal LV function  Patient declined EKG on today's visit She is hoping not to go to the emergency room, ideally would like to have it taken care of in [place] by EP. Discussed with her that they are not available in [place] today   Other past medical history reviewed CXR 07/2019  Stable areas of fibrosis and scarring with scarring primarily present in the right perihilar and posterior mid lung regions. There is also scarring in the right base with rather minimal right pleural Effusion.  On a prior clinic office she was in atrial flutter with rapid rate Started on amiodarone after discussion with [facility] EP She had worsening shortness of breath, presented to the emergency room Admitted to the hospital with Cardioversion, 04/08/17 Lasix IV for management of her CHF  Echocardiogram showing EF 30 to 35%, moderately elevated RVSP  Cardioversion at the end of May 2018 for atrial flutter Since that time she has felt relatively well, recent vacation in [place] the past  week Reports that she did not feel right at times but unable to put her finger on close going on  CT scan September 2016 showing improvement of her lung cancer She finished her cancer treatment over the summer 2016, she had chemotherapy and radiation  TEE/DCCV in 2013 for her PAF. In 2014 she was diagnosed with atypical atrial flutter and underwent ablation. underwent PPM generator change in  2013.   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  finished concurrent chemoradiation with carboplatinum and Taxol and PET scan showed significant response.   Echo showed EF 30-35%, severe anterior and infero/posterior wall HK. Left ventricular function parameters were normal, mild to moderate MR. Left atrium was mildly dilated. RV systolic function was normal. Mild to moderate TR. PASP was moderately to severely elevated at 60 mm Hg.   outpatient cardiac cath on 04/28/2015 that showed 3 vessel CAD with patent SVG to LAD, occluded SVG to OM, native RCA had severe ISR in the mid-segment and was occluded distally at the sites of previously placed stents. There were left to right collaterals. Moderately reduced LVSF with EF of 35-40%. Mildly elevated LVEDP. There were no good targets for revascularization. medical therapy.    Prior CV studies:   The following studies were reviewed today:    Past Medical History:  Diagnosis Date  . Acute midline low back pain without sciatica 08/13/2016  . Acute on chronic respiratory failure with hypoxia ([place]) 08/21/2018  . Acute respiratory failure with hypoxia ([place]) 12/11/2017  . Allergy   . Atypical atrial flutter ([place])    a. s/p ablation 07/27/2013 followed by Dr. Rockey Situ  . Balance problem   . CAD (coronary artery disease)    a. s/p MI x 2 in 2002 s/p PCI x 2 in 2002; b. s/p 2v CABG 2002; c. stress echo 07/2004 w/ evi of pos & inf infarct & no evi of ischemia; d. 4/08 dipyridamole scan w/ multiple areas of infarct, no ischemia, EF 49%; e. cath 04/28/15 3v CAD, med Rx rec, no targets for revasc, LM lum irregs, pLAD 30%, 100%, ost-pLCx 60%, [name] 99%, OM2 100%, p-mRCA 90%, [name] 100% L-R collats, VG-mLAD irregs, VG-OM2 oc  . Carcinoma of right lung ([place]) 01/03/2015   a. followed by Dr. Oliva Bustard  . Chronic systolic CHF (congestive heart failure) ([place])    a. echo 03/2015: EF 30-35%, sev ant/inf/pos HK, in mild to mod MR   . Complication of anesthesia    more recently patient oxygen levels do not rebound as quickly  . Compressed spine fracture ([place]) 08/06/2016   lumbar 2, t11, t12  . COPD (chronic obstructive pulmonary disease) ([place])   . Fracture of multiple pubic rami, right, closed, initial encounter ([place]) 05/30/2016   05/27/16 from a fall.  . Fractured pelvis ([place]) 05/26/2016   2 places  . GERD (gastroesophageal reflux disease)   . History of blood clots    12/2001 left leg  . History of colonoscopy 2013  . History of mammography, screening 2015  . History of Papanicolaou smear of cervix 2013  . HLD (hyperlipidemia)   . HTN (hypertension)   . Hypothyroidism   . Lung cancer ([place])   . Mitral regurgitation    a. s/p mitral ring placement 09/2000; b. echo 09/2010: EF 50%, inf HK, post AK, mild MR, prosthetic mitral valve ring w/ peak gradient of 10 mmHg; b. echo 2/13: EF 50%, mild MR/TR     . Myocardial infarction (  [place])    X 2 (LAST ONE IN 2002)  . Neuropathy   . Pacemaker    a. MDT 2002; b. generator replacement 2013; c. followed by Dr. Omelia Blackwater, MD  . PAF (paroxysmal atrial fibrillation) ([place])    a. on Eliquis   . Personal history of chemotherapy   . Personal history of radiation therapy   . Pneumonia   . Type II diabetes mellitus with complication ([place]) 5/63/1497   Past Surgical History:  Procedure Laterality Date  . ABLATION  04/2016   [facility]  . APPENDECTOMY    . CARDIAC CATHETERIZATION N/A 04/28/2015   Procedure: Left Heart Cath and Coronary Angiography;  Surgeon: Wellington Hampshire, MD;  Location: [place] CV LAB;  Service: Cardiovascular;  Laterality: N/A;  . CARDIOVERSION N/A 04/08/2017   Procedure: CARDIOVERSION;  Surgeon: Wellington Hampshire, MD;  Location: [facility] ORS;  Service: Cardiovascular;  Laterality: N/A;  . CHEST TUBE INSERTION N/A 07/11/2018   Procedure: PLEURX CATH INSERTION;  Surgeon: [name], MD;  Location: [facility] ORS;  Service: Thoracic;  Laterality: N/A;  . COLONOSCOPY   12/2009   2 small tubular adenomas  . CORONARY ARTERY BYPASS GRAFT  09/2000  . DRAIN REMOVAL Right 08/10/2018   Procedure: DRAIN REMOVAL;  Surgeon: [name], MD;  Location: [facility] ORS;  Service: General;  Laterality: Right;  . ECTOPIC PREGNANCY SURGERY    . ELECTROMAGNETIC NAVIGATION BROCHOSCOPY N/A 10/06/2015   Procedure: ELECTROMAGNETIC NAVIGATION BRONCHOSCOPY;  Surgeon: Flora Lipps, MD;  Location: [facility] ORS;  Service: Cardiopulmonary;  Laterality: N/A;  . ENDOBRONCHIAL ULTRASOUND N/A 10/06/2015   Procedure: ENDOBRONCHIAL ULTRASOUND;  Surgeon: Flora Lipps, MD;  Location: [facility] ORS;  Service: Cardiopulmonary;  Laterality: N/A;  . FLEXIBLE BRONCHOSCOPY Right 07/11/2018   Procedure: FLEXIBLE BRONCHOSCOPY;  Surgeon: [name], MD;  Location: [facility] ORS;  Service: Thoracic;  Laterality: Right;  . KYPHOPLASTY N/A 09/28/2016   Procedure: KYPHOPLASTY;  Surgeon: Hessie Knows, MD;  Location: [facility] ORS;  Service: Orthopedics;  Laterality: N/A;  . KYPHOPLASTY N/A 02/20/2018   Procedure: [id number];  Surgeon: Hessie Knows, MD;  Location: [facility] ORS;  Service: Orthopedics;  Laterality: N/A;  . PACEMAKER INSERTION  03/2012  . thorocentesis  12/24/2016  . VAGINAL HYSTERECTOMY     partial - left ovary remains  . VIDEO ASSISTED THORACOSCOPY Right 07/11/2018   Procedure: VIDEO ASSISTED THORACOSCOPY;  Surgeon: [name], MD;  Location: [facility] ORS;  Service: Thoracic;  Laterality: Right;  Marland Kitchen VIDEO ASSISTED THORACOSCOPY (VATS) W/TALC PLEUADESIS Right 07/11/2018   Procedure: THORACOTOMY PLEURAL BIOPSY WITH TALC PLEURODESIS;  Surgeon: [name], MD;  Location: [facility] ORS;  Service: Thoracic;  Laterality: Right;  pleural biopsy     Current Meds  Medication Sig  . acetaminophen (TYLENOL) 500 MG tablet Take 1,000 mg by mouth every 6 (six) hours as needed for mild pain or moderate pain.   . budesonide (PULMICORT) 0.5 MG/2ML nebulizer solution INHALE 2 MILLILITERS BY NEBULIZER 2 TIMES DAILY  . cetirizine (ZYRTEC)  10 MG tablet TAKE (1) TABLET BY MOUTH EVERY DAY (Patient taking differently: Take 10 mg by mouth daily. )  . ELIQUIS 5 MG TABS tablet TAKE (1) TABLET BY MOUTH TWICE DAILY  . ENTRESTO 24-26 MG TAKE (1) TABLET BY MOUTH TWICE DAILY  . fluticasone (FLONASE) 50 MCG/ACT nasal spray Place 2 sprays into both nostrils daily.  . furosemide (LASIX) 20 MG tablet Take 1 tablet (20 mg total) by mouth as directed. Decrease to 1 tablet twice a day alternating with once a day and  extra tablet as needed for swelling.  Marland Kitchen guaiFENesin (MUCINEX) 600 MG 12 hr tablet Take 1 tablet (600 mg total) by mouth 2 (two) times daily as needed.  Marland Kitchen ipratropium-albuterol (DUONEB) 0.5-2.5 (3) MG/3ML SOLN TAKE 3 MLS BY NEBULIZATION EVERY 6 HOURSAS NEEDED FOR WHEEZING OR SHORTNESS OF BREATH  . levothyroxine (SYNTHROID) 88 MCG tablet Take 1 tablet (88 mcg total) by mouth daily before breakfast.  . metoprolol succinate (TOPROL-XL) 25 MG 24 hr tablet Take 2 tablets in the AM and 2 tablets in the PM.  . Multiple Vitamins-Minerals (CENTRUM SILVER PO) Take 1 tablet by mouth daily.  . OXYGEN Inhale 3 L into the lungs.   . rosuvastatin (CRESTOR) 5 MG tablet TAKE ONE (1) TABLET BY MOUTH ONCE DAILY  . senna (SENOKOT) 8.6 MG TABS tablet Take 1 tablet (8.6 mg total) by mouth daily.  Maretta Bees 175 MCG/3ML nebulizer solution Use one vial in nebulizer once daily. Do not mix with other nebulized medications.     Allergies:   Cefdinir and Lovenox [enoxaparin sodium]   Social History   Tobacco Use  . Smoking status: Former Smoker    Packs/day: 1.00    Years: 40.00    Pack years: 40.00    Types: Cigarettes    Quit date: 08/30/2000    Years since quitting: 19.8  . Smokeless tobacco: Never Used  . Tobacco comment: quit smoking in 08/28/2000. Smoking cessation materials not required  Vaping Use  . Vaping Use: Never used  Substance Use Topics  . Alcohol use: Yes    Alcohol/week: 10.0 standard drinks    Types: 10 Glasses of wine per week     Comment: 1 glass of wine per day  . Drug use: No     Family Hx: The patient's family history includes COPD in her brother, mother, and sister; Colon cancer in her brother; Hypertension in her brother and sister; Lung disease in her father; Stroke in her maternal grandmother. There is no history of Heart attack or Breast cancer.  ROS:   Please see the history of present illness.    Review of Systems  Constitutional: Negative.   HENT: Negative.   Respiratory: Positive for cough and shortness of breath.   Cardiovascular: Negative.   Gastrointestinal: Negative.   Musculoskeletal: Negative.   Neurological: Negative.   Psychiatric/Behavioral: Negative.   All other systems reviewed and are negative.    Labs/Other Tests and Data Reviewed:    Recent Labs: 09/28/2019: Platelets 288 01/22/2020: ALT 12; Hemoglobin 10.3; TSH 10.04 02/11/2020: B Natriuretic Peptide 2,112.1; BUN 29; Creatinine, Ser 1.65; Potassium 4.6; Sodium 139   Recent Lipid Panel Lab Results  Component Value Date/Time   CHOL 131 08/25/2017 09:37 AM   TRIG 159 (H) 08/25/2017 09:37 AM   HDL 36 (L) 08/25/2017 09:37 AM   CHOLHDL 3.6 08/25/2017 09:37 AM   LDLCALC 63 08/25/2017 09:37 AM    Wt Readings from Last 3 Encounters:  06/17/20 141 lb (64 kg)  06/02/20 138 lb (62.6 kg)  03/10/20 147 lb (66.7 kg)     Exam:    Vital Signs: Vital signs may also be detailed in the HPI BP (!) 110/52 (BP Location: Left Arm, Patient Position: Sitting, Cuff Size: Normal)   Pulse 75   Ht _0  (1.676 m)   Wt 141 lb (64 kg)   BMI 22.76 kg/m   Constitutional:  oriented to person, place, and time. No distress.  HENT:  Head: Grossly normal Eyes:  no discharge. No  scleral icterus.  Neck: No JVD, no carotid bruits  Cardiovascular: Regular rate and rhythm, no murmurs appreciated Pulmonary/Chest: Coarse breath sounds bilaterally, Rales at the bases Abdominal: Soft.  no distension.  no tenderness.  Musculoskeletal: Normal range of  motion Neurological:  normal muscle tone. Coordination normal. No atrophy Skin: Skin warm and dry Psychiatric: normal affect, pleasant   ASSESSMENT & PLAN:    PAF (paroxysmal atrial fibrillation) (HCC)  Chronic obstructive pulmonary disease with acute exacerbation (HCC)  Typical atrial flutter (HCC)  Coronary artery disease involving coronary bypass graft of native heart with angina pectoris ([place])  Pulmonary hypertension, unspecified (HCC)  Dilated cardiomyopathy (HCC)  Essential hypertension  Chronic obstructive pulmonary disease, unspecified COPD type (HCC)  Chronic diastolic CHF (congestive heart failure) (HCC)   Atrial flutter Normal sinus rhythm in June 2021 Normal sinus rhythm today Followed by [facility] Cardioversion April 2021 No changes to her regimen  Pulmonary hypertension Moderate elevated heart pressures on echo, -Recommend she continue on her Lasix daily with Lasix twice daily alternating  Paroxysmal atrial fibrillation (HCC) -atrial flutter Previous ablation at [facility] On anticoagulation Maintaining normal sinus rhythm  Hyperlipidemia - No changes to her medications  Chronic diastolic CHF Appears relatively euvolemic, no changes to her medications  DM type 2, goal A1c below 7 Medically managed  COPD exacerbation ([place] Prior history of acute on chronic bronchitis Recently treated with prednisone, doxycycline, mild improvement in her symptoms but worsening cough and congestion has now recurred -She is recommended we reach out to pulmonary to see what they recommend, may need longer course of prednisone, stronger antibiotic Seems to have similar symptoms every year  Chronic back pain Management per orthopedics    Total encounter time more than 25 minutes  Greater than 50% was spent in counseling and coordination of care with the patient   Signed, Ida Rogue, MD  06/17/2020 2:28 PM    [place]  Office [address], [place], [place] [address]

## 2020-06-17 ENCOUNTER — Ambulatory Visit (INDEPENDENT_AMBULATORY_CARE_PROVIDER_SITE_OTHER): Payer: Medicare Other | Admitting: Cardiovascular Disease

## 2020-06-17 ENCOUNTER — Other Ambulatory Visit: Payer: Self-pay

## 2020-06-17 ENCOUNTER — Encounter: Payer: Self-pay | Admitting: Cardiovascular Disease

## 2020-06-17 ENCOUNTER — Other Ambulatory Visit: Payer: Self-pay | Admitting: Cardiovascular Disease

## 2020-06-17 VITALS — BP 110/52 | HR 75 | Ht 66.0 in | Wt 141.0 lb

## 2020-06-17 DIAGNOSIS — I5032 Chronic diastolic (congestive) heart failure: Secondary | ICD-10-CM

## 2020-06-17 DIAGNOSIS — J441 Chronic obstructive pulmonary disease with (acute) exacerbation: Secondary | ICD-10-CM

## 2020-06-17 DIAGNOSIS — I483 Typical atrial flutter: Secondary | ICD-10-CM

## 2020-06-17 DIAGNOSIS — I25709 Atherosclerosis of coronary artery bypass graft(s), unspecified, with unspecified angina pectoris: Secondary | ICD-10-CM | POA: Diagnosis not present

## 2020-06-17 DIAGNOSIS — I272 Pulmonary hypertension, unspecified: Secondary | ICD-10-CM

## 2020-06-17 DIAGNOSIS — I480 Paroxysmal atrial fibrillation: Secondary | ICD-10-CM | POA: Diagnosis not present

## 2020-06-17 DIAGNOSIS — I10 Essential (primary) hypertension: Secondary | ICD-10-CM

## 2020-06-17 DIAGNOSIS — I420 Dilated cardiomyopathy: Secondary | ICD-10-CM

## 2020-06-17 DIAGNOSIS — J449 Chronic obstructive pulmonary disease, unspecified: Secondary | ICD-10-CM

## 2020-06-17 NOTE — Patient Instructions (Addendum)
Look up Dr. Arnoldo Morale, neurosurgeon   Medication Instructions:  No changes  If you need a refill on your cardiac medications before your next appointment, please call your pharmacy.    Lab work: No new labs needed   If you have labs (blood work) drawn today and your tests are completely normal, you will receive your results only by: Marland Kitchen MyChart Message (if you have MyChart) OR . A paper copy in the mail If you have any lab test that is abnormal or we need to change your treatment, we will call you to review the results.   Testing/Procedures: No new testing needed   Follow-Up: At [facility], you and your health needs are our priority.  As part of our continuing mission to provide you with exceptional heart care, we have created designated Provider Care Teams.  These Care Teams include your primary Cardiologist (physician) and Advanced Practice Providers (APPs -  Physician Assistants and Nurse Practitioners) who all work together to provide you with the care you need, when you need it.  . You will need a follow up appointment in 6 months  . Providers on your designated Care Team:   . Murray Hodgkins, NP . Christell Faith, PA-C . Marrianne Mood, PA-C  Any Other Special Instructions Will Be Listed Below (If Applicable).  COVID-19 Vaccine Information can be found at: ShippingScam.co.uk For questions related to vaccine distribution or appointments, please email [email or web address] or call [phone].

## 2020-06-27 ENCOUNTER — Other Ambulatory Visit: Payer: Self-pay | Admitting: Internal Medicine

## 2020-06-27 DIAGNOSIS — J441 Chronic obstructive pulmonary disease with (acute) exacerbation: Secondary | ICD-10-CM

## 2020-07-03 MED ORDER — AZITHROMYCIN 250 MG PO TABS
ORAL_TABLET | ORAL | 0 refills | Status: AC
Start: 2020-07-03 — End: 2020-07-08

## 2020-07-03 MED ORDER — PREDNISONE 20 MG PO TABS: 20.0000 mg | ORAL_TABLET | Freq: Every day | ORAL | 0 refills | Status: DC

## 2020-07-03 NOTE — Telephone Encounter (Signed)
Dr. Mortimer Fries,  Please advise. Thanks

## 2020-07-17 ENCOUNTER — Other Ambulatory Visit: Payer: Self-pay | Admitting: Cardiovascular Disease

## 2020-07-17 NOTE — Telephone Encounter (Signed)
Rx request sent to pharmacy.  

## 2020-07-30 ENCOUNTER — Other Ambulatory Visit: Payer: Self-pay

## 2020-07-30 ENCOUNTER — Ambulatory Visit (INDEPENDENT_AMBULATORY_CARE_PROVIDER_SITE_OTHER): Payer: Medicare Other | Admitting: Internal Medicine

## 2020-07-30 ENCOUNTER — Encounter: Payer: Self-pay | Admitting: Internal Medicine

## 2020-07-30 VITALS — BP 112/60 | HR 76 | Temp 97.6°F | Ht 66.0 in | Wt 140.4 lb

## 2020-07-30 DIAGNOSIS — I25709 Atherosclerosis of coronary artery bypass graft(s), unspecified, with unspecified angina pectoris: Secondary | ICD-10-CM | POA: Diagnosis not present

## 2020-07-30 DIAGNOSIS — J449 Chronic obstructive pulmonary disease, unspecified: Secondary | ICD-10-CM | POA: Diagnosis not present

## 2020-07-30 NOTE — Progress Notes (Signed)
Date: 07/30/2020  MRN# [id number] Jenny Cooper [birth year]  PMD - Dr. Gayland Curry Jenny Cooper is a 84 y.o. old female seen in follow up for  LLL mass/COPD/Fibrosis      Synopsis - 84 year old female first evaluated by pulmonary in early 2016 for chronic cough, found to have right hilar mass, biopsy of mass and pathology specimens positive for squamous cell, stage IIIa. Now status post chemoradiation, recently found to have left lower lobe mass, with chronic cough, chronic antibiotics. Biopsy, EBUS, ENB, of left lower lobe mass negative for malignancy - she has an adverse reaction to prednisone therapy wears her A. fib goes out of control She is currently on amiodarone therapy    chronic cough, along with postradiation fibrosis. 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 Status post bronchoscopy in February 2017-negative for malignancy; May 5th PET scan shows no significant concerns for recurrent malignancy; shows radiation changes; pleural effusion   CC Follow-up COPD Follow-up shortness of breath    HPI Shortness of breath seems to be stable at this time Nebulizer therapy helps Patient uses oxygen therapy 24/7 She needs this for survival  No exacerbation at this time No evidence of heart failure at this time No evidence or signs of infection at this time No respiratory distress No fevers, chills, nausea, vomiting, diarrhea No evidence of lower extremity edema No evidence hemoptysis  Her respiratory capacity has significantly declined Patient no longer can take traditional inhaler therapy Patient currently on nebulized therapy for maximum benefit  Recent COPD exacerbation has been treated with prednisone antibiotics she responded well to therapy and feeling much better    Allergies:  Cefdinir and Lovenox [enoxaparin sodium]    Review of Systems:  Gen:   Denies  fever, sweats, chills weight loss  HEENT: Denies blurred vision, double vision, ear pain, eye pain, hearing loss, nose bleeds, sore throat Cardiac:  No dizziness, chest pain or heaviness, chest tightness,edema, No JVD Resp:   No cough, -sputum production, -shortness of breath,-wheezing, -hemoptysis,  Gi: Denies swallowing difficulty, stomach pain, nausea or vomiting, diarrhea, constipation, bowel incontinence Gu:  Denies bladder incontinence, burning urine Ext:   Denies Joint pain, stiffness or swelling Skin: Denies  skin rash, easy bruising or bleeding or hives Endoc:  Denies polyuria, polydipsia , polyphagia or weight change Psych:   Denies depression, insomnia or hallucinations  Other:  All other systems negative      Physical Examination:  BP 112/60 (BP Location: Left Arm, Cuff Size: Normal)   Pulse 76   Temp 97.6 F (36.4 C) (Temporal)   Ht 5\' 6"  (1.676 m)   Wt 140 lb 6.4 oz (63.7 kg)   SpO2 94%   BMI 22.66 kg/m     Physical Examination:   General Appearance: No distress  Neuro:without focal findings,  speech normal,  HEENT: PERRLA, EOM intact.   Pulmonary: normal breath sounds, No wheezing.  CardiovascularNormal S1,S2.  No m/r/g.   Abdomen: Benign, Soft, non-tender. Renal:  No costovertebral tenderness  GU:  Not performed at this time. Endoc: No evident thyromegaly Skin:   warm, no rashes, no ecchymosis  Extremities: normal, no cyanosis, clubbing. PSYCHIATRIC: Mood, affect within normal limits.   ALL OTHER ROS ARE NEGATIVE    ECHO 03/2014 MILD LV DYSFUNCTION  NORMAL RIGHT VENTRICULAR SYSTOLIC FUNCTION VALVULAR REGURGITATION: MILD MR, TRIVIAL PR, MILD TR PROSTHETIC VALVE(S): PROSTHETIC MV RING  CT chest 11/2017 Right hilar and suprahilar scarring associated with scarring in the medial right lung, likely treatment related from prior radiation therapy.    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 pneumonitis/fibrosis with allergic rhinitis with underlying afib with CHF with chronic hypoxic respiratory failure   Moderate COPD Gold stage B  Patient with respiratory insufficiency unable to tolerate traditional inhaler therapy  Continue Pulmicort nebs  Yupelri LAMA This regimen has helped tremendously  Recent bout of COPD exacerbation seems to have resolved with prednisone antibiotic therapy Patient has responded well to therapy  Chronic hypoxic respiratory failure She needs continuous oxygen therapy 2 L nasal cannula sometimes 3 L nasal cannula With exertion and at nighttime She uses and benefits from therapy She needs oxygen to survive  A. Fib/CHF Continue oral AC Follow-up with Dr. Rockey Situ Continue amiodarone therapy   H/o stage 3A non-small cell lung cancer Follow up oncology as scheduled    Total time spent 24 minutes    COVID-19 EDUCATION: The signs and symptoms of COVID-19 were discussed with the patient and how to seek care for testing (follow up with PCP or arrange E-visit).  The importance of social distancing was discussed today.   MEDICATION ADJUSTMENTS/LABS AND TESTS ORDERED: Continue NEB therapy as prescribed Continue Oxygen as prescribed CURRENT MEDICATIONS REVIEWED AT LENGTH WITH PATIENT TODAY   Patient/Family are satisfied with Plan of action and management. All questions answered  Follow up   Corrin Parker, M.D.  [facility]  Medical Director [place] Director [facility] Cardio-Pulmonary Department

## 2020-07-30 NOTE — Patient Instructions (Signed)
Continue NEB therapy as prescribed Continue Oxygen as prescribed

## 2020-08-15 MED ORDER — PREDNISONE 10 MG (21) PO TBPK
ORAL_TABLET | ORAL | 0 refills | Status: DC
Start: 2020-08-15 — End: 2020-11-21

## 2020-08-15 MED ORDER — AZITHROMYCIN 250 MG PO TABS
ORAL_TABLET | ORAL | 0 refills | Status: AC
Start: 2020-08-15 — End: 2020-08-20

## 2020-08-15 NOTE — Telephone Encounter (Signed)
As below, agree.

## 2020-08-15 NOTE — Telephone Encounter (Signed)
  It's me again!  My chest is tightening, I'm coughing a lot and it's a dry hard cough.  When I'm able to cough up something it is sometimes a light yellow.   I am running no fever.  Basically just a tight chest and a dry hacky cough and the standard shortness of breath. Jenny Cooper   Dr. Delanna Ahmadi advise. Thanks

## 2020-08-15 NOTE — Telephone Encounter (Signed)
Per Dr. Patsey Berthold verbally- use duoneb TID, prednisone taper pack and zpak  Patient is aware and voiced her understanding.  Rx for zpak and prednisone has been sent to preferred pharmacy.  Nothing further needed.

## 2020-08-15 NOTE — Telephone Encounter (Signed)
Spoke to patient via telephone.  She reports of dry cough at times prod with light yellow mucus and chest tightness x2-3d Sob is baseline. Denied F/C/S. She has had both covid vaccines and flu shot.  She had not used albuterol. Using pulmicort BID.   Dr. Patsey Berthold, please advise.

## 2020-08-25 ENCOUNTER — Other Ambulatory Visit: Payer: Self-pay | Admitting: Cardiovascular Disease

## 2020-09-30 MED ORDER — IPRATROPIUM-ALBUTEROL 0.5-2.5 (3) MG/3ML IN SOLN: RESPIRATORY_TRACT | 1 refills | Status: DC

## 2020-09-30 NOTE — Telephone Encounter (Signed)
Patient is requesting a refill on duoneb. She is currently taking pulmicort and yupelri.  I do not see Duoneb mentioned in last OV note.   Dr. Mortimer Fries, please advise. thanks

## 2020-10-09 ENCOUNTER — Other Ambulatory Visit: Payer: Self-pay | Admitting: Cardiovascular Disease

## 2020-10-09 DIAGNOSIS — C3401 Malignant neoplasm of right main bronchus: Secondary | ICD-10-CM

## 2020-10-09 NOTE — Telephone Encounter (Signed)
Nurse Doroteo Bradford wanted to know if okay to refill medication or need dosing change, please see her note based on no current labs and weight. Thanks [employer]

## 2020-10-09 NOTE — Telephone Encounter (Signed)
Pt last saw Dr Rockey Situ 06/17/20, last labs 02/11/20 Creat 1.65  previous Creat 1.29 on 01/29/20. Age 85, weight 63.7kg, based on specified criteria pt is not on appropriate dosage of Eliquis 5mg  BID.  Creat historically has been below 1.5, will send to Dr Rockey Situ to see if he wants to reduce Eliquis dosage or have pt repeat labwork since last labs drawn were in June 2021.

## 2020-10-13 ENCOUNTER — Other Ambulatory Visit: Payer: Self-pay | Admitting: Internal Medicine

## 2020-10-13 DIAGNOSIS — E039 Hypothyroidism, unspecified: Secondary | ICD-10-CM

## 2020-10-13 NOTE — Telephone Encounter (Signed)
Requested medication (s) are due for refill today:   Yes  Requested medication (s) are on the active medication list:   Yes  Future visit scheduled:   No   Last ordered: 10/19/2019 #30, 12 refills  Clinic note:  Returned because protocol failed due to lab work due.     Requested Prescriptions  Pending Prescriptions Disp Refills   levothyroxine (SYNTHROID) 88 MCG tablet [Pharmacy Med Name: LEVOTHYROXINE SODIUM 88 MCG TAB] 30 tablet 12    Sig: TAKE (1) TABLET BY MOUTH EVERY DAY BEFORE BREAKFAST      Endocrinology:  Hypothyroid Agents Failed - 10/13/2020  8:17 AM      Failed - TSH needs to be rechecked within 3 months after an abnormal result. Refill until TSH is due.      Failed - TSH in normal range and within 360 days    TSH  Date Value Ref Range Status  01/22/2020 10.04 (A) 0.41 - 5.90 Final  10/02/2019 5.130 (H) 0.450 - 4.500 uIU/mL Final          Passed - Valid encounter within last 12 months    Recent Outpatient Visits           8 months ago COPD with acute exacerbation [facility])   [facility] Glean Hess, MD   12 months ago Rash and nonspecific skin eruption   [facility] Glean Hess, MD   1 year ago Chills without fever   [facility] Glean Hess, MD   2 years ago Acute cystitis without hematuria   [facility] Glean Hess, MD   2 years ago Dilated cardiomyopathy [facility])   [facility] Glean Hess, MD

## 2020-10-20 ENCOUNTER — Other Ambulatory Visit: Payer: Self-pay

## 2020-10-20 DIAGNOSIS — C3401 Malignant neoplasm of right main bronchus: Secondary | ICD-10-CM

## 2020-10-20 DIAGNOSIS — I480 Paroxysmal atrial fibrillation: Secondary | ICD-10-CM

## 2020-10-20 MED ORDER — APIXABAN 5 MG PO TABS: ORAL_TABLET | ORAL | 0 refills | Status: DC

## 2020-10-20 NOTE — Telephone Encounter (Signed)
Per raquel refill sent for one month supply and pt contacted

## 2020-10-20 NOTE — Telephone Encounter (Signed)
42f, 63.7kg, scr 1.65 (02/11/20), lovw/gollan (06/17/2020). Pt requesting refill of 5mg  eliquis but they only qualify for 2.5 mg will route to pharmd pool for clarification

## 2020-10-20 NOTE — Addendum Note (Signed)
Addended by: Allean Found on: 10/20/2020 03:53 PM   Modules accepted: Orders

## 2020-11-21 ENCOUNTER — Other Ambulatory Visit: Payer: Self-pay

## 2020-11-21 ENCOUNTER — Other Ambulatory Visit: Payer: Self-pay | Admitting: Internal Medicine

## 2020-11-21 ENCOUNTER — Ambulatory Visit (INDEPENDENT_AMBULATORY_CARE_PROVIDER_SITE_OTHER): Payer: Medicare Other | Admitting: Internal Medicine

## 2020-11-21 ENCOUNTER — Encounter: Payer: Self-pay | Admitting: Internal Medicine

## 2020-11-21 VITALS — BP 124/68 | HR 82 | Temp 97.6°F | Ht 66.0 in | Wt 148.0 lb

## 2020-11-21 DIAGNOSIS — C3490 Malignant neoplasm of unspecified part of unspecified bronchus or lung: Secondary | ICD-10-CM

## 2020-11-21 DIAGNOSIS — J9611 Chronic respiratory failure with hypoxia: Secondary | ICD-10-CM

## 2020-11-21 DIAGNOSIS — N3001 Acute cystitis with hematuria: Secondary | ICD-10-CM | POA: Diagnosis not present

## 2020-11-21 DIAGNOSIS — I480 Paroxysmal atrial fibrillation: Secondary | ICD-10-CM

## 2020-11-21 DIAGNOSIS — J449 Chronic obstructive pulmonary disease, unspecified: Secondary | ICD-10-CM | POA: Diagnosis not present

## 2020-11-21 DIAGNOSIS — I420 Dilated cardiomyopathy: Secondary | ICD-10-CM

## 2020-11-21 LAB — POC URINALYSIS WITH MICROSCOPIC (NON AUTO)MANUAL RESULT
Bilirubin, UA: NEGATIVE
Crystals: 0
Epithelial cells, urine per micros: 0
Glucose, UA: NEGATIVE
Ketones, UA: NEGATIVE
Mucus, UA: 0
Nitrite, UA: POSITIVE
Protein, UA: NEGATIVE
RBC: 10 M/uL — AB (ref 4.04–5.48)
Spec Grav, UA: 1.01 (ref 1.010–1.025)
Urobilinogen, UA: 0.2 E.U./dL
WBC Casts, UA: 50
pH, UA: 6.5 (ref 5.0–8.0)

## 2020-11-21 MED ORDER — SULFAMETHOXAZOLE-TRIMETHOPRIM 800-160 MG PO TABS: 1.0000 | ORAL_TABLET | Freq: Two times a day (BID) | ORAL | 0 refills | Status: AC

## 2020-11-21 NOTE — Progress Notes (Signed)
Date:  11/21/2020   Name:  Jenny Cooper   DOB:  20-Nov-1935   MRN:  [id number]   Chief Complaint: Urinary Frequency (X1 week,Not a lot of urine, no burning, no itching  )  Urinary Tract Infection  This is a new problem. The current episode started in the past 7 days (frequency). The patient is experiencing no pain. There has been no fever. She is not sexually active. Associated symptoms include frequency and urgency. Pertinent negatives include no chills, flank pain or hematuria. She has tried nothing for the symptoms.   Atrial Fibrillation - rate has been controlled.  On DOAC without bleeding.  CHF stable and doing well on Entresto.  Recently had pacemaker evaluation.  COPD/respiratory failure - followed by Pulmonary.  On O2 around the clock with home sats good 92-95%.  No recent exacerbations or infections.  Stopped daily prednisone in December.  Lab Results  Component Value Date   CREATININE 1.65 (H) 02/11/2020   BUN 29 (H) 02/11/2020   NA 139 02/11/2020   K 4.6 02/11/2020   CL 97 (L) 02/11/2020   CO2 31 02/11/2020   Lab Results  Component Value Date   CHOL 131 08/25/2017   HDL 36 (L) 08/25/2017   LDLCALC 63 08/25/2017   TRIG 159 (H) 08/25/2017   CHOLHDL 3.6 08/25/2017   Lab Results  Component Value Date   TSH 10.04 (A) 01/22/2020   Lab Results  Component Value Date   HGBA1C 5.5 09/28/2019   Lab Results  Component Value Date   WBC 4.2 01/22/2020   HGB 10.3 (A) 01/22/2020   HCT 34 (A) 10/25/2019   MCV 90 09/28/2019   PLT 288 09/28/2019   Lab Results  Component Value Date   ALT 12 01/22/2020   AST 42 (A) 01/22/2020   ALKPHOS 99 01/22/2020   BILITOT 1.0 01/15/2019     Review of Systems  Constitutional: Negative for chills, fatigue and fever.  Respiratory: Positive for cough, shortness of breath and wheezing. Negative for chest tightness.   Cardiovascular: Negative for chest pain and leg swelling.  Gastrointestinal: Negative for abdominal pain and  constipation.  Genitourinary: Positive for frequency and urgency. Negative for dysuria, flank pain and hematuria.  Neurological: Negative for dizziness and headaches.    Patient Active Problem List   Diagnosis Date Noted  . Squamous cell lung cancer ([place]) 02/11/2020  . Drug-induced polyneuropathy ([place]) 01/23/2020  . Low vitamin B12 level 01/14/2019  . Pulmonary hypertension, unspecified ([place]) 12/01/2018  . Chronic respiratory failure with hypoxia ([place]) 12/01/2018  . Normocytic anemia 10/17/2018  . Recurrent pleural effusion on right 07/11/2018  . Environmental and seasonal allergies 12/20/2017  . Muscle spasms of neck 12/20/2017  . Coronary artery disease of bypass graft of native heart with stable angina pectoris ([place]) 04/21/2017  . Chronic combined systolic and diastolic CHF (congestive heart failure) ([place]) 04/14/2017  . Sinus node dysfunction ([place]) 04/06/2017  . Back pain of thoracolumbar region 03/15/2017  . PAF (paroxysmal atrial fibrillation) ([place]) 03/05/2017  . Falls frequently 03/05/2017  . Compression fracture of L2 lumbar vertebra ([place]) 08/31/2016  . Cardiomyopathy ([place]) 04/29/2016  . Hx of adenomatous colonic polyps 11/17/2015  . Radiation pneumonitis ([place]) 10/21/2015  . Mitral regurgitation   . HLD (hyperlipidemia)   . Paroxysmal supraventricular tachycardia ([place])   . NSTEMI (non-ST elevated myocardial infarction) ([place])   . Arteriosclerosis of coronary artery 01/11/2015  . Hypothyroidism (acquired) 01/11/2015  . Disorder of peripheral nervous system 10/04/2014  .  Cervical radiculopathy, chronic 10/04/2014  . Atrial flutter ([place]) 11/16/2013  . Chronic obstructive pulmonary disease ([place]) 04/24/2012  . Essential hypertension 08/03/2011  . Pacemaker 08/03/2011    Allergies  Allergen Reactions  . Cefdinir Rash    Had hives after completing treatment - unsure if it was the cause  . Lovenox [Enoxaparin Sodium] Itching    Past Surgical History:  Procedure  Laterality Date  . ABLATION  04/2016   [facility]  . APPENDECTOMY    . CARDIAC CATHETERIZATION N/A 04/28/2015   Procedure: Left Heart Cath and Coronary Angiography;  Surgeon: Wellington Hampshire, MD;  Location: [facility];  Service: Cardiovascular;  Laterality: N/A;  . CARDIOVERSION N/A 04/08/2017   Procedure: CARDIOVERSION;  Surgeon: Wellington Hampshire, MD;  Location: [facility];  Service: Cardiovascular;  Laterality: N/A;  . CHEST TUBE INSERTION N/A 07/11/2018   Procedure: PLEURX CATH INSERTION;  Surgeon: [name], MD;  Location: [facility];  Service: Thoracic;  Laterality: N/A;  . COLONOSCOPY  12/2009   2 small tubular adenomas  . CORONARY ARTERY BYPASS GRAFT  09/2000  . DRAIN REMOVAL Right 08/10/2018   Procedure: DRAIN REMOVAL;  Surgeon: [name], MD;  Location: [facility];  Service: General;  Laterality: Right;  . ECTOPIC PREGNANCY SURGERY    . ELECTROMAGNETIC NAVIGATION BROCHOSCOPY N/A 10/06/2015   Procedure: ELECTROMAGNETIC NAVIGATION BRONCHOSCOPY;  Surgeon: Flora Lipps, MD;  Location: [facility];  Service: Cardiopulmonary;  Laterality: N/A;  . ENDOBRONCHIAL ULTRASOUND N/A 10/06/2015   Procedure: ENDOBRONCHIAL ULTRASOUND;  Surgeon: Flora Lipps, MD;  Location: [facility];  Service: Cardiopulmonary;  Laterality: N/A;  . FLEXIBLE BRONCHOSCOPY Right 07/11/2018   Procedure: FLEXIBLE BRONCHOSCOPY;  Surgeon: [name], MD;  Location: [facility];  Service: Thoracic;  Laterality: Right;  . KYPHOPLASTY N/A 09/28/2016   Procedure: KYPHOPLASTY;  Surgeon: Hessie Knows, MD;  Location: [facility];  Service: Orthopedics;  Laterality: N/A;  . KYPHOPLASTY N/A 02/20/2018   Procedure: [id number];  Surgeon: Hessie Knows, MD;  Location: [facility];  Service: Orthopedics;  Laterality: N/A;  . PACEMAKER INSERTION  03/2012  . thorocentesis  12/24/2016  . VAGINAL HYSTERECTOMY     partial - left ovary remains  . VIDEO ASSISTED THORACOSCOPY Right 07/11/2018   Procedure: VIDEO ASSISTED THORACOSCOPY;  Surgeon: [name], MD;  Location: [facility];  Service: Thoracic;  Laterality: Right;  Marland Kitchen VIDEO ASSISTED THORACOSCOPY (VATS) W/TALC PLEUADESIS Right 07/11/2018   Procedure: THORACOTOMY PLEURAL BIOPSY WITH TALC PLEURODESIS;  Surgeon: [name], MD;  Location: [facility];  Service: Thoracic;  Laterality: Right;  pleural biopsy    Social History   Tobacco Use  . Smoking status: Former Smoker    Packs/day: 1.00    Years: 40.00    Pack years: 40.00    Types: Cigarettes    Quit date: 2001    Years since quitting: 21.2  . Smokeless tobacco: Never Used  . Tobacco comment: quit smoking in 08/28/2000. Smoking cessation materials not required  Vaping Use  . Vaping Use: Never used  Substance Use Topics  . Alcohol use: Yes    Alcohol/week: 10.0 standard drinks    Types: 10 Glasses of wine per week    Comment: 1 glass of wine per day  . Drug use: No     Medication list has been reviewed and updated.  Current Meds  Medication Sig  . acetaminophen (TYLENOL) 500 MG tablet Take 1,000 mg by mouth every 6 (six) hours as needed for mild pain or moderate pain.   Marland Kitchen  apixaban (ELIQUIS) 5 MG TABS tablet TAKE (1) TABLET BY MOUTH TWICE DAILY LABS NEEDED FOR FURTHER REFILLS  . budesonide (PULMICORT) 0.5 MG/2ML nebulizer solution INHALE 2 MILLILITERS BY NEBULIZER 2 TIMES DAILY  . cetirizine (ZYRTEC) 10 MG tablet TAKE (1) TABLET BY MOUTH EVERY DAY (Patient taking differently: Take 10 mg by mouth daily.)  . Cyanocobalamin (VITAMIN B 12 PO) Take by mouth.  . ENTRESTO 24-26 MG TAKE (1) TABLET BY MOUTH TWICE DAILY  . fluticasone (FLONASE) 50 MCG/ACT nasal spray Place 2 sprays into both nostrils daily.  . furosemide (LASIX) 20 MG tablet Take 1 tablet (20 mg total) by mouth as directed. Decrease to 1 tablet twice a day alternating with once a day and extra tablet as needed for swelling.  Marland Kitchen guaiFENesin (MUCINEX) 600 MG 12 hr tablet Take 1 tablet (600 mg total) by mouth 2 (two) times daily as needed.  Marland Kitchen ipratropium-albuterol  (DUONEB) 0.5-2.5 (3) MG/3ML SOLN TAKE 3 MLS BY NEBULIZATION EVERY 6 HOURSAS NEEDED FOR WHEEZING OR SHORTNESS OF BREATH  . levothyroxine (SYNTHROID) 88 MCG tablet TAKE (1) TABLET BY MOUTH EVERY DAY BEFORE BREAKFAST  . metoprolol succinate (TOPROL-XL) 25 MG 24 hr tablet Take 2 tablets in the AM and 2 tablets in the PM.  . Multiple Vitamins-Minerals (CENTRUM SILVER PO) Take 1 tablet by mouth daily.  . OXYGEN Inhale 3 L into the lungs.   . rosuvastatin (CRESTOR) 5 MG tablet TAKE ONE (1) TABLET BY MOUTH ONCE DAILY  . senna (SENOKOT) 8.6 MG TABS tablet Take 1 tablet (8.6 mg total) by mouth daily.  Maretta Bees 175 MCG/3ML nebulizer solution Use one vial in nebulizer once daily. Do not mix with other nebulized medications.    PHQ 2/9 Scores 11/21/2020 06/02/2020 01/23/2020 10/19/2019  PHQ - 2 Score 0 0 0 0  PHQ- 9 Score 0 - 0 -    GAD 7 : Generalized Anxiety Score 11/21/2020 01/23/2020  Nervous, Anxious, on Edge 0 0  Control/stop worrying 0 0  Worry too much - different things 0 0  Trouble relaxing 0 0  Restless 0 0  Easily annoyed or irritable 0 0  Afraid - awful might happen 0 0  Total GAD 7 Score 0 0  Anxiety Difficulty - Not difficult at all    BP Readings from Last 3 Encounters:  11/21/20 124/68  07/30/20 112/60  06/17/20 (!) 110/52    Physical Exam Vitals and nursing note reviewed.  Constitutional:      General: She is not in acute distress.    Appearance: She is well-developed.  Cardiovascular:     Rate and Rhythm: Normal rate. Rhythm irregular.     Pulses: Normal pulses.  Pulmonary:     Effort: Pulmonary effort is normal. No accessory muscle usage or respiratory distress.     Breath sounds: Examination of the right-lower field reveals wheezing. Decreased breath sounds and wheezing present.     Comments: [place] in place Abdominal:     General: Bowel sounds are normal.     Palpations: Abdomen is soft.     Tenderness: There is abdominal tenderness in the suprapubic area. There is no  guarding or rebound.  Musculoskeletal:     Cervical back: Normal range of motion.  Lymphadenopathy:     Cervical: No cervical adenopathy.  Skin:    General: Skin is warm and dry.  Neurological:     Mental Status: She is alert.  Psychiatric:        Mood and Affect: Mood normal.  Behavior: Behavior normal.     Wt Readings from Last 3 Encounters:  11/21/20 148 lb (67.1 kg)  07/30/20 140 lb 6.4 oz (63.7 kg)  06/17/20 141 lb (64 kg)    BP 124/68   Pulse 82   Temp 97.6 F (36.4 C) (Oral)   Ht _0  (1.676 m)   Wt 148 lb (67.1 kg)   SpO2 94%   BMI 23.89 kg/m   Assessment and Plan: 1. Acute cystitis with hematuria Will start antibiotics and await culture Increase fluid intake - Urine Culture - POC urinalysis w microscopic (non auto) - sulfamethoxazole-trimethoprim (BACTRIM DS) 800-160 MG tablet; Take 1 tablet by mouth 2 (two) times daily for 7 days.  Dispense: 14 tablet; Refill: 0  2. PAF (paroxysmal atrial fibrillation) ([place]) Rate controlled; tolerating Eliquis Followed by Cardiology  3. Chronic obstructive pulmonary disease, unspecified COPD type ([place]) On O2 [place] around the clock Followed by Pulmonary  4. Dilated cardiomyopathy ([place]) On Enstresto  5. Chronic respiratory failure with hypoxia (HCC) Maintaining good sats on chronic O2  6. Squamous cell carcinoma of lung, unspecified laterality [facility]) Last seen by Oncology in 2020 Doing well currently - likely due for follow up visit   Partially dictated using Dragon software. Any errors are unintentional.  Halina Maidens, MD [facility]  11/21/2020

## 2020-11-24 ENCOUNTER — Other Ambulatory Visit: Payer: Self-pay | Admitting: Cardiovascular Disease

## 2020-11-24 NOTE — Telephone Encounter (Signed)
Rx request sent to pharmacy.  

## 2020-11-26 ENCOUNTER — Other Ambulatory Visit: Payer: Self-pay

## 2020-11-26 DIAGNOSIS — C3401 Malignant neoplasm of right main bronchus: Secondary | ICD-10-CM

## 2020-11-26 MED ORDER — APIXABAN 5 MG PO TABS: ORAL_TABLET | ORAL | 1 refills | Status: DC

## 2020-11-26 NOTE — Telephone Encounter (Signed)
Pt's age 85, wt 67.1 kg, SCr 1.6, CrCl 28.72, last ov w/ TG 06/19/20. Will need labs prior to future refills.

## 2020-11-27 LAB — URINE CULTURE

## 2020-12-16 NOTE — Progress Notes (Signed)
Date:  12/17/2020   ID:  Jenny Cooper, DOB May 15, 1936, MRN [id number]  Patient Location:  [place] [address]   Provider location:   [facility], [place] office  PCP:  Glean Hess, MD  Cardiologist:  Arvid Right [facility]  Chief Complaint  Patient presents with  . 6 month follow up     "doing well." Medications reviewed by the patient verbally.     History of Present Illness:    Jenny Cooper is a 85 y.o. female  past medical history of CAD  s/p remote history of MI in 2002  2 vessel CABG post stenting in 2002,  mitral valve repair in 2002 secondary to mitral regurgitation,  PAF TEE/DCCV in 2013 for her PAF. atypical atrial flutter s/p ablation on 07/27/2013  atrial fibrillation and typical atrial flutter ablation [facility] s/p MDT PPM,   COPD,  HTN,   HLD  [facility] on 04/01/15 with increased SOB, PNA, elevated troponin.  Quit smoking in 2001 carcinoma of right lung, has completed chemotherapy  falls, and chronic back pain from collapsed vertebrae Suffered pelvic fracture after fall in September 2017 Chronic bronchitis EF 50 to 55% She presents today to the clinic for follow-up of her atrial fibrillation/flutter and CAD  Last office visit October 2021  More active, started more stretching at home for her back Chronic back pain Breathing stable, on oxygen Chronic SOB,rarely turns up oxygen, typically uses 3 L  Lasix daily in Am Occasionally feels abdomen is bloated, does not feel it is constipation Has not been taking extra Lasix No recent renal function labs available for review  Denies any bronchitis symptoms Uses her nebulizers to help loosen up the secretions  Mucinex PRN  EKG personally reviewed by myself on todays visit Shows atrial flutter ventricular rate 82 bpm no significant ST-T wave changes  Other past medical history reviewed Followed by [facility] EP She underwent cardioversion December 18, 2019,  recurrent symptoms Jan 13, 2020, seen by [facility] again Jan 22, 2020, pacemaker changes made Metoprolol increased to 50 twice daily [facility] increase Lasix up to 40 twice daily for weight gain  Echocardiogram March 06, 2020 results reviewed with her on today's visit Moderate pulmonary hypertension, normal LV function  Patient declined EKG on today's visit She is hoping not to go to the emergency room, ideally would like to have it taken care of in [place] by EP. Discussed with her that they are not available in [place] today  CXR 07/2019  Stable areas of fibrosis and scarring with scarring primarily present in the right perihilar and posterior mid lung regions. There is also scarring in the right base with rather minimal right pleural Effusion.  On a prior clinic office she was in atrial flutter with rapid rate Started on amiodarone after discussion with [facility] EP She had worsening shortness of breath, presented to the emergency room Admitted to the hospital with Cardioversion, 04/08/17 Lasix IV for management of her CHF  Echocardiogram showing EF 30 to 35%, moderately elevated RVSP  Cardioversion at the end of May 2018 for atrial flutter Since that time she has felt relatively well, recent vacation in [place] the past week Reports that she did not feel right at times but unable to put her finger on close going on  CT scan September 2016 showing improvement of her lung cancer She finished her cancer treatment over the summer 2016, she had chemotherapy and radiation  TEE/DCCV in 2013 for her PAF. In 2014 she was diagnosed with atypical atrial flutter and underwent ablation. underwent PPM generator change in 2013.   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  finished concurrent chemoradiation with carboplatinum and Taxol and PET scan showed significant response.   Echo showed EF 30-35%, severe anterior and infero/posterior wall HK. Left  ventricular function parameters were normal, mild to moderate MR. Left atrium was mildly dilated. RV systolic function was normal. Mild to moderate TR. PASP was moderately to severely elevated at 60 mm Hg.   outpatient cardiac cath on 04/28/2015 that showed 3 vessel CAD with patent SVG to LAD, occluded SVG to OM, native RCA had severe ISR in the mid-segment and was occluded distally at the sites of previously placed stents. There were left to right collaterals. Moderately reduced LVSF with EF of 35-40%. Mildly elevated LVEDP. There were no good targets for revascularization. medical therapy.    Prior CV studies:   The following studies were reviewed today:    Past Medical History:  Diagnosis Date  . Acute midline low back pain without sciatica 08/13/2016  . Acute on chronic respiratory failure with hypoxia ([place]) 08/21/2018  . Acute respiratory failure with hypoxia ([place]) 12/11/2017  . Allergy   . Atypical atrial flutter ([place])    a. s/p ablation 07/27/2013 followed by Dr. Rockey Situ  . Balance problem   . CAD (coronary artery disease)    a. s/p MI x 2 in 2002 s/p PCI x 2 in 2002; b. s/p 2v CABG 2002; c. stress echo 07/2004 w/ evi of pos & inf infarct & no evi of ischemia; d. 4/08 dipyridamole scan w/ multiple areas of infarct, no ischemia, EF 49%; e. cath 04/28/15 3v CAD, med Rx rec, no targets for revasc, LM lum irregs, pLAD 30%, 100%, ost-pLCx 60%, [name] 99%, OM2 100%, p-mRCA 90%, [name] 100% L-R collats, VG-mLAD irregs, VG-OM2 oc  . Carcinoma of right lung ([place]) 01/03/2015   a. followed by Dr. Oliva Bustard  . Chronic systolic CHF (congestive heart failure) ([place])    a. echo 03/2015: EF 30-35%, sev ant/inf/pos HK, in mild to mod MR  . Complication of anesthesia    more recently patient oxygen levels do not rebound as quickly  . Compressed spine fracture ([place]) 08/06/2016   lumbar 2, t11, t12  . COPD (chronic obstructive pulmonary disease) ([place])   . Fracture of multiple pubic rami, right, closed,  initial encounter ([place]) 05/30/2016   05/27/16 from a fall.  . Fractured pelvis ([place]) 05/26/2016   2 places  . GERD (gastroesophageal reflux disease)   . History of blood clots    12/2001 left leg  . History of colonoscopy 2013  . History of mammography, screening 2015  . History of Papanicolaou smear of cervix 2013  . HLD (hyperlipidemia)   . HTN (hypertension)   . Hypothyroidism   . Lung cancer ([place])   . Mitral regurgitation    a. s/p mitral ring placement 09/2000; b. echo 09/2010: EF 50%, inf HK, post AK, mild MR, prosthetic mitral valve ring w/ peak gradient of 10 mmHg; b. echo 2/13: EF 50%, mild MR/TR     . Myocardial infarction ([place])    X 2 (LAST ONE IN 2002)  . Neuropathy   . Pacemaker    a. MDT 2002; b. generator replacement 2013; c. followed by Dr. Omelia Blackwater, MD  . PAF (paroxysmal atrial fibrillation) ([place])    a. on Eliquis   .  Personal history of chemotherapy   . Personal history of radiation therapy   . Pneumonia   . Type II diabetes mellitus with complication ([place]) 7/51/7001   Past Surgical History:  Procedure Laterality Date  . ABLATION  04/2016   [facility]  . APPENDECTOMY    . CARDIAC CATHETERIZATION N/A 04/28/2015   Procedure: Left Heart Cath and Coronary Angiography;  Surgeon: Wellington Hampshire, MD;  Location: [facility];  Service: Cardiovascular;  Laterality: N/A;  . CARDIOVERSION N/A 04/08/2017   Procedure: CARDIOVERSION;  Surgeon: Wellington Hampshire, MD;  Location: [facility];  Service: Cardiovascular;  Laterality: N/A;  . CHEST TUBE INSERTION N/A 07/11/2018   Procedure: PLEURX CATH INSERTION;  Surgeon: [name], MD;  Location: [facility] ORS;  Service: Thoracic;  Laterality: N/A;  . COLONOSCOPY  12/2009   2 small tubular adenomas  . CORONARY ARTERY BYPASS GRAFT  09/2000  . DRAIN REMOVAL Right 08/10/2018   Procedure: DRAIN REMOVAL;  Surgeon: [name], MD;  Location: [facility] ORS;  Service: General;  Laterality: Right;  . ECTOPIC PREGNANCY SURGERY    .  ELECTROMAGNETIC NAVIGATION BROCHOSCOPY N/A 10/06/2015   Procedure: ELECTROMAGNETIC NAVIGATION BRONCHOSCOPY;  Surgeon: Flora Lipps, MD;  Location: [facility] OR[facility];  Service: Cardiopulmonary;  Laterality: N/A;  . ENDOBRONCHIAL ULTRASOUND N/A 10/06/2015   Procedure: ENDOBRONCHIAL ULTRASOUND;  Surgeon: Flora Lipps, MD;  Location: [facility] OR[facility];  Service: Cardiopulmonary;  Laterality: N/A;  . FLEXIBLE BRONCHOSCOPY Right 07/11/2018   Procedure: FLEXIBLE BRONCHOSCOPY;  Surgeon: [name], MD;  Location: [facility];  Service: Thoracic;  Laterality: Right;  . KYPHOPLASTY N/A 09/28/2016   Procedure: KYPHOPLASTY;  Surgeon: Hessie Knows, MD;  Location: [facility];  Service: Orthopedics;  Laterality: N/A;  . KYPHOPLASTY N/A 02/20/2018   Procedure: [id number];  Surgeon: Hessie Knows, MD;  Location: [facility] OR[facility];  Service: Orthopedics;  Laterality: N/A;  . PACEMAKER INSERTION  03/2012  . thorocentesis  12/24/2016  . VAGINAL HYSTERECTOMY     partial - left ovary remains  . VIDEO ASSISTED THORACOSCOPY Right 07/11/2018   Procedure: VIDEO ASSISTED THORACOSCOPY;  Surgeon: [name], MD;  Location: [facility];  Service: Thoracic;  Laterality: Right;  Marland Kitchen VIDEO ASSISTED THORACOSCOPY (VATS) W/TALC PLEUADESIS Right 07/11/2018   Procedure: THORACOTOMY PLEURAL BIOPSY WITH TALC PLEURODESIS;  Surgeon: [name], MD;  Location: [facility] ORS;  Service: Thoracic;  Laterality: Right;  pleural biopsy     Current Meds  Medication Sig  . acetaminophen (TYLENOL) 500 MG tablet Take 1,000 mg by mouth every 6 (six) hours as needed for mild pain or moderate pain.   Marland Kitchen apixaban (ELIQUIS) 5 MG TABS tablet TAKE (1) TABLET BY MOUTH TWICE DAILY LABS NEEDED FOR FURTHER REFILLS  . budesonide (PULMICORT) 0.5 MG/2ML nebulizer solution INHALE 2 MILLILITERS BY NEBULIZER 2 TIMES DAILY  . cetirizine (ZYRTEC) 10 MG tablet TAKE (1) TABLET BY MOUTH EVERY DAY  . Cyanocobalamin (VITAMIN B 12 PO) Take by mouth.  . ENTRESTO 24-26 MG TAKE (1) TABLET BY MOUTH TWICE  DAILY  . fluticasone (FLONASE) 50 MCG/ACT nasal spray Place 2 sprays into both nostrils daily.  . furosemide (LASIX) 20 MG tablet Take 1 tablet (20 mg total) by mouth as directed. Decrease to 1 tablet twice a day alternating with once a day and extra tablet as needed for swelling.  Marland Kitchen guaiFENesin (MUCINEX) 600 MG 12 hr tablet Take 1 tablet (600 mg total) by mouth 2 (two) times daily as needed.  Marland Kitchen ipratropium-albuterol (DUONEB) 0.5-2.5 (3) MG/3ML SOLN TAKE 3 MLS BY NEBULIZATION EVERY  6 HOURSAS NEEDED FOR WHEEZING OR SHORTNESS OF BREATH  . levothyroxine (SYNTHROID) 88 MCG tablet TAKE (1) TABLET BY MOUTH EVERY DAY BEFORE BREAKFAST  . metoprolol succinate (TOPROL-XL) 25 MG 24 hr tablet Take 2 tablets in the AM and 2 tablets in the PM.  . Multiple Vitamins-Minerals (CENTRUM SILVER PO) Take 1 tablet by mouth daily.  . OXYGEN Inhale 3 L into the lungs.   . rosuvastatin (CRESTOR) 5 MG tablet TAKE ONE (1) TABLET BY MOUTH ONCE DAILY  . senna (SENOKOT) 8.6 MG TABS tablet Take 1 tablet (8.6 mg total) by mouth daily.  Maretta Bees 175 MCG/3ML nebulizer solution Use one vial in nebulizer once daily. Do not mix with other nebulized medications.     Allergies:   Cefdinir and Lovenox [enoxaparin sodium]   Social History   Tobacco Use  . Smoking status: Former Smoker    Packs/day: 1.00    Years: 40.00    Pack years: 40.00    Types: Cigarettes    Quit date: 2001    Years since quitting: 21.3  . Smokeless tobacco: Never Used  . Tobacco comment: quit smoking in 08/28/2000. Smoking cessation materials not required  Vaping Use  . Vaping Use: Never used  Substance Use Topics  . Alcohol use: Yes    Alcohol/week: 10.0 standard drinks    Types: 10 Glasses of wine per week    Comment: 1 glass of wine per day  . Drug use: No     Family Hx: The patient's family history includes COPD in her brother, mother, and sister; Colon cancer in her brother; Hypertension in her brother and sister; Lung disease in her  father; Stroke in her maternal grandmother. There is no history of Heart attack or Breast cancer.  ROS:   Please see the history of present illness.    Review of Systems  Constitutional: Negative.   HENT: Negative.   Respiratory: Positive for cough and shortness of breath.   Cardiovascular: Negative.   Gastrointestinal: Negative.   Musculoskeletal: Negative.   Neurological: Negative.   Psychiatric/Behavioral: Negative.   All other systems reviewed and are negative.    Labs/Other Tests and Data Reviewed:    Recent Labs: 01/22/2020: ALT 12; Hemoglobin 10.3; TSH 10.04 02/11/2020: B Natriuretic Peptide 2,112.1; BUN 29; Creatinine, Ser 1.65; Potassium 4.6; Sodium 139   Recent Lipid Panel Lab Results  Component Value Date/Time   CHOL 131 08/25/2017 09:37 AM   TRIG 159 (H) 08/25/2017 09:37 AM   HDL 36 (L) 08/25/2017 09:37 AM   CHOLHDL 3.6 08/25/2017 09:37 AM   LDLCALC 63 08/25/2017 09:37 AM    Wt Readings from Last 3 Encounters:  12/17/20 147 lb 4 oz (66.8 kg)  11/21/20 148 lb (67.1 kg)  07/30/20 140 lb 6.4 oz (63.7 kg)     Exam:    Vital Signs: Vital signs may also be detailed in the HPI BP 110/64 (BP Location: Left Arm, Patient Position: Sitting, Cuff Size: Normal)   Pulse 82   Ht _0  (1.676 m)   Wt 147 lb 4 oz (66.8 kg)   SpO2 94% Comment: oxygen at 3 Liters  BMI 23.77 kg/m   Constitutional:  oriented to person, place, and time. No distress.  HENT:  Head: Grossly normal Eyes:  no discharge. No scleral icterus.  Neck: No JVD, no carotid bruits  Cardiovascular: Regular rate and rhythm, no murmurs appreciated Pulmonary/Chest: Coarse breath sounds bilaterally, Rales at the bases Abdominal: Soft.  no distension.  no tenderness.  Musculoskeletal: Normal range of motion Neurological:  normal muscle tone. Coordination normal. No atrophy Skin: Skin warm and dry Psychiatric: normal affect, pleasant   ASSESSMENT & PLAN:    PAF (paroxysmal atrial fibrillation) (HCC) -  Plan: EKG 65-KCLE, Basic Metabolic Panel (BMET), CBC  Typical atrial flutter ([place]) - Plan: EKG 12-Lead  Chronic obstructive pulmonary disease with acute exacerbation (HCC)  Coronary artery disease involving coronary bypass graft of native heart with angina pectoris ([place])  Pulmonary hypertension, unspecified (HCC)  Dilated cardiomyopathy (HCC)  Essential hypertension  Chronic obstructive pulmonary disease, unspecified COPD type (HCC)  Chronic diastolic CHF (congestive heart failure) (HCC)   Atrial flutter Followed by [facility] EP Controlled ventricular rate On anticoagulation, no falls, asymptomatic  Pulmonary hypertension Moderate elevated heart pressures on echo July 2021 Taking Lasix daily, repeat BMP today  Paroxysmal atrial fibrillation (HCC) -atrial flutter Previous ablation at [facility], followed by EP On anticoagulation Appears to be in flutter today Prior EKGs with significant baseline artifact making rhythm determination difficult  Hyperlipidemia - No changes to her medications  Chronic diastolic CHF Euvolemic, breathing stable, lab work pending  DM type 2, goal A1c below 7 Medically managed  COPD exacerbation ([place] Prior history of acute on chronic bronchitis Previously treated with prednisone, doxycycline Required repeat prednisone and second antibiotic with improved symptoms back in October 2021  Chronic back pain Management per orthopedics    Total encounter time more than 25 minutes  Greater than 50% was spent in counseling and coordination of care with the patient   Signed, Ida Rogue, MD  12/17/2020 3:10 PM    [place] [facility] [address], [place], [place] [address]

## 2020-12-17 ENCOUNTER — Encounter: Payer: Self-pay | Admitting: Cardiovascular Disease

## 2020-12-17 ENCOUNTER — Other Ambulatory Visit: Payer: Self-pay

## 2020-12-17 ENCOUNTER — Ambulatory Visit (INDEPENDENT_AMBULATORY_CARE_PROVIDER_SITE_OTHER): Payer: Medicare Other | Admitting: Cardiovascular Disease

## 2020-12-17 VITALS — BP 110/64 | HR 82 | Ht 66.0 in | Wt 147.2 lb

## 2020-12-17 DIAGNOSIS — I272 Pulmonary hypertension, unspecified: Secondary | ICD-10-CM

## 2020-12-17 DIAGNOSIS — J449 Chronic obstructive pulmonary disease, unspecified: Secondary | ICD-10-CM

## 2020-12-17 DIAGNOSIS — I5032 Chronic diastolic (congestive) heart failure: Secondary | ICD-10-CM

## 2020-12-17 DIAGNOSIS — J441 Chronic obstructive pulmonary disease with (acute) exacerbation: Secondary | ICD-10-CM

## 2020-12-17 DIAGNOSIS — I25709 Atherosclerosis of coronary artery bypass graft(s), unspecified, with unspecified angina pectoris: Secondary | ICD-10-CM

## 2020-12-17 DIAGNOSIS — I480 Paroxysmal atrial fibrillation: Secondary | ICD-10-CM

## 2020-12-17 DIAGNOSIS — I483 Typical atrial flutter: Secondary | ICD-10-CM

## 2020-12-17 DIAGNOSIS — I10 Essential (primary) hypertension: Secondary | ICD-10-CM

## 2020-12-17 DIAGNOSIS — I420 Dilated cardiomyopathy: Secondary | ICD-10-CM

## 2020-12-17 NOTE — Patient Instructions (Addendum)
Medication Instructions:  No changes  If you need a refill on your cardiac medications before your next appointment, please call your pharmacy.   Lab work: BMP and CBC  Coumadin clinic will review labs, LABS WILL APPEAR ON  MYCHART, ABNORMAL RESULTS WILL BE CALLED   Testing/Procedures: No new testing needed  Follow-Up:  . You will need a follow up appointment in 12 months  . Providers on your designated Care Team:   . Murray Hodgkins, NP . Christell Faith, PA-C . Marrianne Mood, PA-C  COVID-19 Vaccine Information can be found at: [email or web address].co.uk For questions related to vaccine distribution or appointments, please email [email or web address] .[email or web address] or call 4072935324.

## 2020-12-18 LAB — CBC
Hematocrit: 43.8 % (ref 34.0–46.6)
Hemoglobin: 13.8 g/dL (ref 11.1–15.9)
MCH: 29.4 pg (ref 26.6–33.0)
MCHC: 31.5 g/dL (ref 31.5–35.7)
MCV: 93 fL (ref 79–97)
Platelets: 164 10*3/uL (ref 150–450)
RBC: 4.7 x10E6/uL (ref 3.77–5.28)
RDW: 13.7 % (ref 11.7–15.4)
WBC: 4.6 10*3/uL (ref 3.4–10.8)

## 2020-12-18 LAB — BASIC METABOLIC PANEL
BUN/Creatinine Ratio: 17 (ref 12–28)
BUN: 26 mg/dL (ref 8–27)
CO2: 25 mmol/L (ref 20–29)
Calcium: 9.3 mg/dL (ref 8.7–10.3)
Chloride: 98 mmol/L (ref 96–106)
Creatinine, Ser: 1.54 mg/dL — ABNORMAL HIGH (ref 0.57–1.00)
Glucose: 97 mg/dL (ref 65–99)
Potassium: 4.3 mmol/L (ref 3.5–5.2)
Sodium: 137 mmol/L (ref 134–144)
eGFR: 34 mL/min/{1.73_m2} — ABNORMAL LOW (ref >59)

## 2020-12-18 LAB — SPECIMEN STATUS REPORT

## 2020-12-31 NOTE — Progress Notes (Signed)
Pt's lab results mailed to pt, pt is not utilizing their MyChart account to see results

## 2021-01-08 ENCOUNTER — Other Ambulatory Visit: Payer: Self-pay | Admitting: Internal Medicine

## 2021-01-08 DIAGNOSIS — E039 Hypothyroidism, unspecified: Secondary | ICD-10-CM

## 2021-01-08 NOTE — Telephone Encounter (Signed)
Requested medication (s) are due for refill today: Yes  Requested medication (s) are on the active medication list: Yes  Last refill:  10/13/20  Future visit scheduled: No  Notes to clinic:  Unable to refill per protocol due to failed labs, no updated results.      Requested Prescriptions  Pending Prescriptions Disp Refills   levothyroxine (SYNTHROID) 88 MCG tablet [Pharmacy Med Name: LEVOTHYROXINE SODIUM 88 MCG TAB] 30 tablet 2    Sig: TAKE (1) TABLET BY MOUTH EVERY DAY BEFORE BREAKFAST      Endocrinology:  Hypothyroid Agents Failed - 01/08/2021  4:21 PM      Failed - TSH needs to be rechecked within 3 months after an abnormal result. Refill until TSH is due.      Failed - TSH in normal range and within 360 days    TSH  Date Value Ref Range Status  01/22/2020 10.04 (A) 0.41 - 5.90 Final  10/02/2019 5.130 (H) 0.450 - 4.500 uIU/mL Final          Passed - Valid encounter within last 12 months    Recent Outpatient Visits           1 month ago Acute cystitis with hematuria   [facility] Glean Hess, MD   11 months ago COPD with acute exacerbation [facility])   [facility] Glean Hess, MD   1 year ago Rash and nonspecific skin eruption   [facility] Glean Hess, MD   1 year ago Chills without fever   [facility] Glean Hess, MD   2 years ago Acute cystitis without hematuria   [facility] Glean Hess, MD       Future Appointments             In 1 month Flora Lipps, MD [place]

## 2021-01-27 ENCOUNTER — Other Ambulatory Visit: Payer: Self-pay | Admitting: Cardiovascular Disease

## 2021-01-28 ENCOUNTER — Other Ambulatory Visit: Payer: Self-pay | Admitting: Cardiovascular Disease

## 2021-01-28 DIAGNOSIS — C3401 Malignant neoplasm of right main bronchus: Secondary | ICD-10-CM

## 2021-01-28 NOTE — Telephone Encounter (Signed)
Pt's age 85, wt 66.8 kg, SCr 1.54, CrCl 29.19, last ov w/ TG 12/17/20. Pt is >80 & SCr>1.5, so pt qualifies for reduced dosage of warfarin. Routing to Dr. Rockey Situ for his approval.

## 2021-01-28 NOTE — Telephone Encounter (Signed)
Refill request

## 2021-02-02 NOTE — Telephone Encounter (Signed)
Patient refill pending and she has not taken for last 5 days .

## 2021-02-03 ENCOUNTER — Other Ambulatory Visit: Payer: Self-pay

## 2021-02-03 MED ORDER — APIXABAN 2.5 MG PO TABS: 2.5000 mg | ORAL_TABLET | Freq: Two times a day (BID) | ORAL | 3 refills | Status: DC

## 2021-02-03 NOTE — Telephone Encounter (Signed)
Eliquis  From Kate Sable, MD Yes , reduc dose of Eliquis to 2.5 mg twice daily. (Age >80, Cr >1.5)   Original message from coumadin RN Mandi   Pt's age 85, wt 66.8 kg, SCr 1.54, CrCl 29.19, last ov w/ TG  12/17/20.  Pt is >80 & SCr>1.5, so pt qualifies for reduced dosage  Of warfarin.  Routing to Dr. Rockey Situ for his approval. (Dr. Rockey Situ out,  message r/t Dr. Mylo Red)   New script sent in to pharmacy Eliquis 2.5 mg BID

## 2021-02-11 ENCOUNTER — Other Ambulatory Visit: Payer: Self-pay | Admitting: Cardiovascular Disease

## 2021-02-11 ENCOUNTER — Other Ambulatory Visit: Payer: Self-pay | Admitting: Internal Medicine

## 2021-02-17 ENCOUNTER — Other Ambulatory Visit: Payer: Self-pay

## 2021-02-17 ENCOUNTER — Ambulatory Visit (INDEPENDENT_AMBULATORY_CARE_PROVIDER_SITE_OTHER): Payer: Medicare Other | Admitting: Internal Medicine

## 2021-02-17 ENCOUNTER — Encounter: Payer: Self-pay | Admitting: Internal Medicine

## 2021-02-17 VITALS — BP 110/62 | HR 75 | Temp 98.1°F | Ht 66.0 in | Wt 150.0 lb

## 2021-02-17 DIAGNOSIS — J449 Chronic obstructive pulmonary disease, unspecified: Secondary | ICD-10-CM

## 2021-02-17 DIAGNOSIS — J9611 Chronic respiratory failure with hypoxia: Secondary | ICD-10-CM

## 2021-02-17 DIAGNOSIS — I25709 Atherosclerosis of coronary artery bypass graft(s), unspecified, with unspecified angina pectoris: Secondary | ICD-10-CM | POA: Diagnosis not present

## 2021-02-17 NOTE — Patient Instructions (Addendum)
Continue nebulizers as prescribed Continue oxygen as prescribed  ALBUTEROL NEBS EVERY 4 HRS AS NEEDED

## 2021-02-17 NOTE — Progress Notes (Signed)
Date: 02/17/2021  MRN# [id number] Jenny Cooper 02-Dec-1935  PMD - Dr. Gayland Curry Jenny Cooper is a 85 y.o. old female seen in follow up for  LLL mass/COPD/Fibrosis      Synopsis - 85 year old female first evaluated by pulmonary in early 2016 for chronic cough, found to have right hilar mass, biopsy of mass and pathology specimens positive for squamous cell, stage IIIa. Now status post chemoradiation, recently found to have left lower lobe mass, with chronic cough, chronic antibiotics. Biopsy, EBUS, ENB, of left lower lobe mass negative for malignancy - she has an adverse reaction to prednisone therapy wears her A. fib goes out of control She is currently on amiodarone therapy    chronic cough, along with postradiation fibrosis. 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 Status post bronchoscopy in February 2017-negative for malignancy; May 5th PET scan shows no significant concerns for recurrent malignancy; shows radiation changes; pleural effusion    CC Follow-up shortness of breath Follow-up assessment of COPD   HPI Shortness of breath and dyspnea exertion seems to be stable at this time Nebulized therapy definitely helps Patient uses oxygen 24/7 she needs this for survival  No exacerbation at this time No evidence of heart failure at this time No evidence or signs of infection at this time No respiratory distress No fevers, chills, nausea, vomiting, diarrhea No evidence of lower extremity edema No evidence hemoptysis  Respiratory capacity has significantly declined She can no longer take traditional inhaler therapy  continue current nebulized therapy     Allergies:  Cefdinir and Lovenox [enoxaparin sodium]    Review of Systems:  Gen:  Denies  fever, sweats, chills weight loss  HEENT: Denies blurred vision, double vision, ear pain, eye pain, hearing loss,  nose bleeds, sore throat Cardiac:  No dizziness, chest pain or heaviness, chest tightness,edema, No JVD Resp:   No cough, -sputum production, -shortness of breath,-wheezing, -hemoptysis,  Gi: Denies swallowing difficulty, stomach pain, nausea or vomiting, diarrhea, constipation, bowel incontinence Gu:  Denies bladder incontinence, burning urine Ext:   Denies Joint pain, stiffness or swelling Skin: Denies  skin rash, easy bruising or bleeding or hives Endoc:  Denies polyuria, polydipsia , polyphagia or weight change Psych:   Denies depression, insomnia or hallucinations  Other:  All other systems negative     Physical Examination:  There were no vitals taken for this visit.   Physical Examination:   General Appearance: No distress  Neuro:without focal findings,  speech normal,  HEENT: PERRLA, EOM intact.   Pulmonary: normal breath sounds, No wheezing.  CardiovascularNormal S1,S2.  No m/r/g.   Abdomen: Benign, Soft, non-tender. Renal:  No costovertebral tenderness  GU:  Not performed at this time. Endoc: No evident thyromegaly Skin:   warm, no rashes, no ecchymosis  Extremities: normal, no cyanosis, clubbing. PSYCHIATRIC: Mood, affect within normal limits.   ALL OTHER ROS ARE NEGATIVE     ECHO 03/2014 MILD LV DYSFUNCTION  NORMAL RIGHT VENTRICULAR SYSTOLIC FUNCTION VALVULAR REGURGITATION: MILD MR, TRIVIAL PR, MILD TR PROSTHETIC VALVE(S): PROSTHETIC MV RING   CT chest 11/2017 Right hilar and suprahilar scarring associated with scarring in the medial right lung, likely treatment related from prior radiation therapy.    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 fib with CHF with  chronic hypoxic respiratory failure  Moderate COPD Gold stage B Patient with respiratory insufficiency unable to tolerate traditional inhaler therapy Continue Pulmicort nebs Yupelri L LAMA This regimen has helped tremendously  Chronic hypoxic respiratory failure Patient uses and benefits from oxygen therapy She needs this for survival  A. fib CHF Continue oral AC Follow-up with cardiology    H/o stage 3A non-small cell lung cancer Follow up oncology as scheduled     MEDICATION ADJUSTMENTS/LABS AND TESTS ORDERED: Continue neb therapy as prescribed Continue oxygen as prescribed  CURRENT MEDICATIONS REVIEWED AT LENGTH WITH PATIENT TODAY   Follow up 1 year   Total time spent 23 minutes  Rasean Joos Patricia Pesa, M.D.  [facility]  Medical Director [place] Director [facility] Cardio-Pulmonary Department

## 2021-02-18 ENCOUNTER — Ambulatory Visit: Payer: Medicare Other | Admitting: Internal Medicine

## 2021-03-30 ENCOUNTER — Other Ambulatory Visit: Payer: Self-pay | Admitting: Cardiovascular Disease

## 2021-04-20 ENCOUNTER — Other Ambulatory Visit: Payer: Self-pay | Admitting: Internal Medicine

## 2021-04-23 ENCOUNTER — Telehealth: Payer: Self-pay | Admitting: Internal Medicine

## 2021-04-23 MED ORDER — YUPELRI 175 MCG/3ML IN SOLN: RESPIRATORY_TRACT | 5 refills | Status: DC

## 2021-04-23 NOTE — Telephone Encounter (Signed)
Jenny Cooper has been sent to preferred pharmacy.  Patient is aware and voiced her understanding.  Nothing further needed.

## 2021-04-29 DIAGNOSIS — H259 Unspecified age-related cataract: Secondary | ICD-10-CM | POA: Insufficient documentation

## 2021-05-14 ENCOUNTER — Other Ambulatory Visit: Payer: Self-pay | Admitting: Cardiovascular Disease

## 2021-05-19 ENCOUNTER — Emergency Department: Payer: Medicare Other

## 2021-05-19 ENCOUNTER — Emergency Department
Admission: EM | Admit: 2021-05-19 | Discharge: 2021-05-19 | Disposition: A | Discharge status: Home / Self Care | Payer: Medicare Other | Attending: Emergency Medicine | Admitting: Emergency Medicine

## 2021-05-19 ENCOUNTER — Other Ambulatory Visit: Payer: Self-pay

## 2021-05-19 ENCOUNTER — Encounter: Payer: Self-pay | Admitting: Emergency Medicine

## 2021-05-19 DIAGNOSIS — I5042 Chronic combined systolic (congestive) and diastolic (congestive) heart failure: Secondary | ICD-10-CM | POA: Diagnosis not present

## 2021-05-19 DIAGNOSIS — S42201A Unspecified fracture of upper end of right humerus, initial encounter for closed fracture: Secondary | ICD-10-CM | POA: Insufficient documentation

## 2021-05-19 DIAGNOSIS — Z7901 Long term (current) use of anticoagulants: Secondary | ICD-10-CM | POA: Diagnosis not present

## 2021-05-19 DIAGNOSIS — Z85118 Personal history of other malignant neoplasm of bronchus and lung: Secondary | ICD-10-CM | POA: Insufficient documentation

## 2021-05-19 DIAGNOSIS — S0990XA Unspecified injury of head, initial encounter: Secondary | ICD-10-CM | POA: Diagnosis not present

## 2021-05-19 DIAGNOSIS — Z7951 Long term (current) use of inhaled steroids: Secondary | ICD-10-CM | POA: Diagnosis not present

## 2021-05-19 DIAGNOSIS — Z951 Presence of aortocoronary bypass graft: Secondary | ICD-10-CM | POA: Insufficient documentation

## 2021-05-19 DIAGNOSIS — S80211A Abrasion, right knee, initial encounter: Secondary | ICD-10-CM | POA: Diagnosis not present

## 2021-05-19 DIAGNOSIS — W06XXXA Fall from bed, initial encounter: Secondary | ICD-10-CM | POA: Insufficient documentation

## 2021-05-19 DIAGNOSIS — I110 Hypertensive heart disease with heart failure: Secondary | ICD-10-CM | POA: Diagnosis not present

## 2021-05-19 DIAGNOSIS — I2510 Atherosclerotic heart disease of native coronary artery without angina pectoris: Secondary | ICD-10-CM | POA: Insufficient documentation

## 2021-05-19 DIAGNOSIS — E039 Hypothyroidism, unspecified: Secondary | ICD-10-CM | POA: Insufficient documentation

## 2021-05-19 DIAGNOSIS — Z79899 Other long term (current) drug therapy: Secondary | ICD-10-CM | POA: Diagnosis not present

## 2021-05-19 DIAGNOSIS — S0011XA Contusion of right eyelid and periocular area, initial encounter: Secondary | ICD-10-CM | POA: Diagnosis not present

## 2021-05-19 DIAGNOSIS — E1140 Type 2 diabetes mellitus with diabetic neuropathy, unspecified: Secondary | ICD-10-CM | POA: Diagnosis not present

## 2021-05-19 DIAGNOSIS — J449 Chronic obstructive pulmonary disease, unspecified: Secondary | ICD-10-CM | POA: Diagnosis not present

## 2021-05-19 DIAGNOSIS — Z87891 Personal history of nicotine dependence: Secondary | ICD-10-CM | POA: Diagnosis not present

## 2021-05-19 DIAGNOSIS — S4991XA Unspecified injury of right shoulder and upper arm, initial encounter: Secondary | ICD-10-CM | POA: Diagnosis present

## 2021-05-19 MED ORDER — OXYCODONE-ACETAMINOPHEN 5-325 MG PO TABS
1.0000 | ORAL_TABLET | Freq: Once | ORAL | Status: AC
  Administered 2021-05-19: 1 via ORAL
  Filled 2021-05-19: qty 1

## 2021-05-19 MED ORDER — OXYCODONE HCL 5 MG PO CAPS: 5.0000 mg | ORAL_CAPSULE | Freq: Every evening | ORAL | 0 refills | Status: AC

## 2021-05-19 MED ORDER — OXYCODONE-ACETAMINOPHEN 5-325 MG PO TABS
1.0000 | ORAL_TABLET | Freq: Once | ORAL | Status: AC
Start: 2021-05-19 — End: 2021-05-19
  Administered 2021-05-19: 1 via ORAL
  Filled 2021-05-19: qty 1

## 2021-05-19 NOTE — ED Triage Notes (Signed)
Arrives via [facility].  Fell when getting out of bed this morning.  C/O right elbow and right upper arm pain.  50 Fentanyl given and 4 zofran.  18g SL LAC.  Paced rhythm at 90.  Other wise VS wnl.  Patient had cataract surgery yesterday.

## 2021-05-19 NOTE — Discharge Instructions (Addendum)
Please take 1 g of Tylenol 3 times daily.  You can take the oxycodone at night.  Be careful that it can cause delirium and lightheadedness/dizziness in the elderly.  Please follow-up with Dr. Roland Rack on Monday.

## 2021-05-19 NOTE — ED Provider Notes (Signed)
[facility]  ____________________________________________   Event Date/Time   First MD Initiated Contact with Patient 05/19/21 1036     (approximate)  I have reviewed the triage vital signs and the nursing notes.   HISTORY  Chief Complaint Arm Injury    HPI Jenny Cooper is a 85 y.o. female with past medical history of atrial flutter on Eliquis, CAD, chronic respiratory failure on 2 L nasal cannula, COPD, CHF who presents after a fall.  Today patient rolled out of bed accidentally.  She fell onto her right shoulder.  She did hit her head but did not lose consciousness.  She complains of severe pain of the right shoulder.  She denies headache, nausea vomiting.  She did hit her right knee but has been able to ambulate.  Denies chest or abdominal pain.  She is anticoagulated on Eliquis.  Denies any numbness or tingling in the hand.         Past Medical History:  Diagnosis Date   Acute midline low back pain without sciatica 08/13/2016   Acute on chronic respiratory failure with hypoxia ([place]) 08/21/2018   Acute respiratory failure with hypoxia ([place]) 12/11/2017   Allergy    Atypical atrial flutter ([place])    a. s/p ablation 07/27/2013 followed by Dr. Rockey Situ   Balance problem    CAD (coronary artery disease)    a. s/p MI x 2 in 2002 s/p PCI x 2 in 2002; b. s/p 2v CABG 2002; c. stress echo 07/2004 w/ evi of pos & inf infarct & no evi of ischemia; d. 4/08 dipyridamole scan w/ multiple areas of infarct, no ischemia, EF 49%; e. cath 04/28/15 3v CAD, med Rx rec, no targets for revasc, LM lum irregs, pLAD 30%, 100%, ost-pLCx 60%, [name] 99%, OM2 100%, p-mRCA 90%, [name] 100% L-R collats, VG-mLAD irregs, VG-OM2 oc   Carcinoma of right lung ([place]) 01/03/2015   a. followed by Dr. Oliva Bustard   Chronic systolic CHF (congestive heart failure) ([place])    a. echo 03/2015: EF 30-35%, sev ant/inf/pos HK, in mild to mod MR   Complication of anesthesia    more recently patient oxygen  levels do not rebound as quickly   Compressed spine fracture ([place]) 08/06/2016   lumbar 2, t11, t12   COPD (chronic obstructive pulmonary disease) ([place])    Fracture of multiple pubic rami, right, closed, initial encounter ([place]) 05/30/2016   05/27/16 from a fall.   Fractured pelvis ([place]) 05/26/2016   2 places   GERD (gastroesophageal reflux disease)    History of blood clots    12/2001 left leg   History of colonoscopy 2013   History of mammography, screening 2015   History of Papanicolaou smear of cervix 2013   HLD (hyperlipidemia)    HTN (hypertension)    Hypothyroidism    Lung cancer ([place])    Mitral regurgitation    a. s/p mitral ring placement 09/2000; b. echo 09/2010: EF 50%, inf HK, post AK, mild MR, prosthetic mitral valve ring w/ peak gradient of 10 mmHg; b. echo 2/13: EF 50%, mild MR/TR      Myocardial infarction ([place])    X 2 (LAST ONE IN 2002)   Neuropathy    Pacemaker    a. MDT 2002; b. generator replacement 2013; c. followed by Dr. Omelia Blackwater, MD   PAF (paroxysmal atrial fibrillation) [facility])    a. on Eliquis    Personal history of chemotherapy    Personal history of radiation therapy  Pneumonia    Type II diabetes mellitus with complication ([place]) 4/54/0981    Patient Active Problem List   Diagnosis Date Noted   Squamous cell lung cancer ([place]) 02/11/2020   Drug-induced polyneuropathy ([place]) 01/23/2020   Low vitamin B12 level 01/14/2019   Pulmonary hypertension, unspecified ([place]) 12/01/2018   Chronic respiratory failure with hypoxia ([place]) 12/01/2018   Normocytic anemia 10/17/2018   Recurrent pleural effusion on right 07/11/2018   Environmental and seasonal allergies 12/20/2017   Muscle spasms of neck 12/20/2017   Coronary artery disease of bypass graft of native heart with stable angina pectoris ([place]) 04/21/2017   Chronic combined systolic and diastolic CHF (congestive heart failure) ([place]) 04/14/2017   Sinus node dysfunction ([place]) 04/06/2017   Back pain of thoracolumbar  region 03/15/2017   PAF (paroxysmal atrial fibrillation) ([place]) 03/05/2017   Falls frequently 03/05/2017   Compression fracture of L2 lumbar vertebra ([place]) 08/31/2016   Cardiomyopathy ([place]) 04/29/2016   Hx of adenomatous colonic polyps 11/17/2015   Radiation pneumonitis ([place]) 10/21/2015   Mitral regurgitation    HLD (hyperlipidemia)    Paroxysmal supraventricular tachycardia (H[facility])    NSTEMI (non-ST elevated myocardial infarction) ([place])    Arteriosclerosis of coronary artery 01/11/2015   Hypothyroidism (acquired) 01/11/2015   Disorder of peripheral nervous system 10/04/2014   Cervical radiculopathy, chronic 10/04/2014   Atrial flutter ([place]) 11/16/2013   Chronic obstructive pulmonary disease ([place]) 04/24/2012   Essential hypertension 08/03/2011   Pacemaker 08/03/2011    Past Surgical History:  Procedure Laterality Date   ABLATION  04/2016   [facility]   APPENDECTOMY     CARDIAC CATHETERIZATION N/A 04/28/2015   Procedure: Left Heart Cath and Coronary Angiography;  Surgeon: Wellington Hampshire, MD;  Location: [facility];  Service: Cardiovascular;  Laterality: N/A;   CARDIOVERSION N/A 04/08/2017   Procedure: CARDIOVERSION;  Surgeon: Wellington Hampshire, MD;  Location: [facility];  Service: Cardiovascular;  Laterality: N/A;   CHEST TUBE INSERTION N/A 07/11/2018   Procedure: PLEURX CATH INSERTION;  Surgeon: [name], MD;  Location: [facility];  Service: Thoracic;  Laterality: N/A;   COLONOSCOPY  12/2009   2 small tubular adenomas   CORONARY ARTERY BYPASS GRAFT  09/2000   DRAIN REMOVAL Right 08/10/2018   Procedure: DRAIN REMOVAL;  Surgeon: [name], MD;  Location: [facility];  Service: General;  Laterality: Right;   ECTOPIC PREGNANCY SURGERY     ELECTROMAGNETIC NAVIGATION BROCHOSCOPY N/A 10/06/2015   Procedure: ELECTROMAGNETIC NAVIGATION BRONCHOSCOPY;  Surgeon: Flora Lipps, MD;  Location: [facility];  Service: Cardiopulmonary;  Laterality: N/A;   ENDOBRONCHIAL ULTRASOUND N/A 10/06/2015    Procedure: ENDOBRONCHIAL ULTRASOUND;  Surgeon: Flora Lipps, MD;  Location: [facility];  Service: Cardiopulmonary;  Laterality: N/A;   FLEXIBLE BRONCHOSCOPY Right 07/11/2018   Procedure: FLEXIBLE BRONCHOSCOPY;  Surgeon: [name], MD;  Location: [facility];  Service: Thoracic;  Laterality: Right;   KYPHOPLASTY N/A 09/28/2016   Procedure: KYPHOPLASTY;  Surgeon: Hessie Knows, MD;  Location: [facility];  Service: Orthopedics;  Laterality: N/A;   KYPHOPLASTY N/A 02/20/2018   Procedure: [id number];  Surgeon: Hessie Knows, MD;  Location: [facility];  Service: Orthopedics;  Laterality: N/A;   PACEMAKER INSERTION  03/2012   thorocentesis  12/24/2016   VAGINAL HYSTERECTOMY     partial - left ovary remains   VIDEO ASSISTED THORACOSCOPY Right 07/11/2018   Procedure: VIDEO ASSISTED THORACOSCOPY;  Surgeon: [name], MD;  Location: [facility];  Service: Thoracic;  Laterality: Right;   VIDEO ASSISTED THORACOSCOPY (VATS) W/TALC  PLEUADESIS Right 07/11/2018   Procedure: THORACOTOMY PLEURAL BIOPSY WITH TALC PLEURODESIS;  Surgeon: [name], MD;  Location: [facility];  Service: Thoracic;  Laterality: Right;  pleural biopsy    Prior to Admission medications   Medication Sig Start Date End Date Taking? Authorizing Provider  oxycodone (OXY-IR) 5 MG capsule Take 1 capsule (5 mg total) by mouth at bedtime for 5 days. 05/19/21 05/24/21 Yes Rada Hay, MD  acetaminophen (TYLENOL) 500 MG tablet Take 1,000 mg by mouth every 6 (six) hours as needed for mild pain or moderate pain.     [provider]  apixaban (ELIQUIS) 2.5 MG TABS tablet Take 1 tablet (2.5 mg total) by mouth 2 (two) times daily. 02/03/21   Kate Sable, MD  budesonide (PULMICORT) 0.5 MG/2ML nebulizer solution INHALE 2 MILLILITERS BY NEBULIZER 2 TIMES DAILY 06/27/20   Flora Lipps, MD  cetirizine (ZYRTEC) 10 MG tablet TAKE (1) TABLET BY MOUTH EVERY DAY 06/15/18   Glean Hess, MD  Cyanocobalamin (VITAMIN B 12 PO) Take by mouth.     [provider]  Cyanocobalamin 2000 MCG TBCR Take by mouth.    [provider]  ENTRESTO 24-26 MG TAKE (1) TABLET BY MOUTH TWICE DAILY 01/27/21   Minna Merritts, MD  fluticasone (FLONASE) 50 MCG/ACT nasal spray Place 2 sprays into both nostrils daily. 01/23/20   Glean Hess, MD  furosemide (LASIX) 20 MG tablet TAKE 1 TABLET BY MOUTH TWICE DAILY, ALTERNATING WITH 1 EXTRA TABLET AS NEEDED FOR SWELLING. 05/15/21   Minna Merritts, MD  guaiFENesin (MUCINEX) 600 MG 12 hr tablet Take 1 tablet (600 mg total) by mouth 2 (two) times daily as needed. 02/11/20   Martyn Ehrich, NP  ipratropium-albuterol (DUONEB) 0.5-2.5 (3) MG/3ML SOLN TAKE 3 MLS BY NEBULIZATION EVERY 6 HOURSAS NEEDED FOR WHEEZING OR SHORTNESS OF BREATH 02/11/21   Flora Lipps, MD  levothyroxine (SYNTHROID) 88 MCG tablet TAKE (1) TABLET BY MOUTH EVERY DAY BEFORE BREAKFAST 01/08/21   Glean Hess, MD  metoprolol succinate (TOPROL-XL) 25 MG 24 hr tablet Take 2 tablets (50 mg total) by mouth in the morning and at bedtime. 02/12/21   Minna Merritts, MD  Multiple Vitamins-Minerals (CENTRUM SILVER PO) Take 1 tablet by mouth daily.    [provider]  OXYGEN Inhale 3 L into the lungs.     [provider]  rosuvastatin (CRESTOR) 5 MG tablet TAKE ONE (1) TABLET BY MOUTH ONCE DAILY 03/30/21   Minna Merritts, MD  senna (SENOKOT) 8.6 MG TABS tablet Take 1 tablet (8.6 mg total) by mouth daily. 05/27/16   Rudene Re, MD  YUPELRI 175 MCG/3ML nebulizer solution Use one vial in nebulizer once daily. Do not mix with other nebulized medications. 04/23/21   Flora Lipps, MD    Allergies Cefdinir and Lovenox [enoxaparin sodium]  Family History  Problem Relation Age of Onset   COPD Mother        sister, and brother   Lung disease Father    Stroke Maternal Grandmother    Hypertension Sister    COPD Sister    Hypertension Brother    COPD Brother    Colon cancer Brother    Heart attack Neg Hx     Breast cancer Neg Hx     Social History Social History   Tobacco Use   Smoking status: Former    Packs/day: 1.00    Years: 40.00    Pack years: 40.00    Types:  Cigarettes    Quit date: 2001    Years since quitting: 21.7   Smokeless tobacco: Never   Tobacco comments:    quit smoking in 08/28/2000. Smoking cessation materials not required  Vaping Use   Vaping Use: Never used  Substance Use Topics   Alcohol use: Yes    Alcohol/week: 10.0 standard drinks    Types: 10 Glasses of wine per week    Comment: 1 glass of wine per day   Drug use: No    Review of Systems   Review of Systems  Respiratory:  Negative for shortness of breath.   Cardiovascular:  Negative for chest pain and leg swelling.  Gastrointestinal:  Negative for abdominal pain.  Musculoskeletal:  Positive for arthralgias and myalgias. Negative for back pain and neck pain.  Neurological:  Negative for weakness and numbness.  All other systems reviewed and are negative.  Physical Exam Updated Vital Signs BP 109/61 (BP Location: Right Arm)   Pulse 89   Temp 97.8 F (36.6 C)   Resp 16   Ht _0  (1.676 m)   Wt 68 kg   SpO2 100%   BMI 24.20 kg/m   Physical Exam Vitals and nursing note reviewed.  Constitutional:      General: She is not in acute distress.    Appearance: Normal appearance.  HENT:     Head: Normocephalic.     Comments: Ecchymosis of the right upper and lower eyelid, pupil is dilated and minimally reactive compared to right, patient with recent cataract surgery on the left just yesterday extraocular movements are intact    Nose: Nose normal.  Eyes:     General: No scleral icterus.    Extraocular Movements: Extraocular movements intact.     Conjunctiva/sclera: Conjunctivae normal.  Cardiovascular:     Rate and Rhythm: Normal rate and regular rhythm.     Pulses: Normal pulses.  Pulmonary:     Effort: Pulmonary effort is normal. No respiratory distress.     Breath sounds: No stridor.   Abdominal:     General: There is no distension.     Palpations: Abdomen is soft.     Tenderness: There is no abdominal tenderness.  Musculoskeletal:        General: Deformity and signs of injury present.     Cervical back: Normal range of motion. No tenderness.     Comments: Right shoulder is held in abduction and external rotation, obvious deformity of the right anterior shoulder, 2+ radial pulses bilaterally, intact okay sign, finger abduction and thumbs up on the right  Positive right anterior chest wall tenderness without crepitus  Pelvis is stable, nontender  Small abrasion over the right knee, no obvious swelling or deformity, nontender to palpation  Skin:    General: Skin is dry.     Coloration: Skin is not jaundiced or pale.  Neurological:     General: No focal deficit present.     Mental Status: She is alert and oriented to person, place, and time. Mental status is at baseline.  Psychiatric:        Mood and Affect: Mood normal.        Behavior: Behavior normal.     LABS (all labs ordered are listed, but only abnormal results are displayed)  Labs Reviewed - No data to display ____________________________________________  EKG  N/a ____________________________________________  RADIOLOGY Almeta Monas, personally viewed and evaluated these images (plain radiographs) as part of my medical decision making, as well  as reviewing the written report by the radiologist.  ED MD interpretation: I reviewed the x-ray of the right shoulder which shows a comminuted and displaced humeral head fracture  I reviewed the CT head and C-spine which are negative for acute pathology  I reviewed the x-ray of the chest which shows chronic scarring but no acute pathology  ____________________________________________   PROCEDURES  Procedure(s) performed (including Critical Care):  Procedures   ____________________________________________   INITIAL IMPRESSION / ASSESSMENT  AND PLAN / ED COURSE     85 year old female presents after a fall out of the bed onto her right shoulder.  She has some ecchymosis around the right eye.  She is on Eliquis will obtain CT head and C-spine.  The right shoulder has obvious deformity.  She is neurovascular intact.  X-ray of the right shoulder shows a severely comminuted and displaced proximal humerus fracture.  I discussed with on-call orthopedist Dr. Roland Rack recommends putting her in a posterior long-arm splint and follow-up in 5 to 7 days.  At that time they will determine whether she needs further imaging or potentially surgery.  CT head and C-spine are negative.  X-ray of the chest obtained given she had some right anterior chest wall tenderness does not show any rib fracture or pneumothorax.  Patient was placed in a splint.  I recommended that she take Tylenol during the day.  Given the severity of the fracture will prescribe several days of oxycodone to take at night.  I discussed the risks of opioids in a patient this age with the patient and her daughter who are aware.  Also discussed the follow-up plan.  She is otherwise stable for discharge.      ____________________________________________   FINAL CLINICAL IMPRESSION(S) / ED DIAGNOSES  Final diagnoses:  Closed fracture of proximal end of right humerus, unspecified fracture morphology, initial encounter     ED Discharge Orders          Ordered    oxycodone (OXY-IR) 5 MG capsule  Nightly        05/19/21 1335             Note:  This document was prepared using Dragon voice recognition software and may include unintentional dictation errors.    Rada Hay, MD 05/19/21 [phone]

## 2021-05-19 NOTE — ED Notes (Signed)
See triage note  presents s/p fall  states she fell from bed   landed on right arm   bruising note to face

## 2021-05-25 ENCOUNTER — Other Ambulatory Visit: Payer: Self-pay | Admitting: Orthopedic Surgery

## 2021-05-25 ENCOUNTER — Other Ambulatory Visit: Payer: Self-pay

## 2021-05-25 ENCOUNTER — Ambulatory Visit
Admission: RE | Admit: 2021-05-25 | Discharge: 2021-05-25 | Disposition: A | Discharge status: Home / Self Care | Payer: Medicare Other | Source: Ambulatory Visit | Attending: Orthopedic Surgery | Admitting: Orthopedic Surgery

## 2021-05-25 DIAGNOSIS — S42291A Other displaced fracture of upper end of right humerus, initial encounter for closed fracture: Secondary | ICD-10-CM | POA: Diagnosis present

## 2021-05-26 ENCOUNTER — Other Ambulatory Visit: Payer: Self-pay | Admitting: Surgery

## 2021-05-26 ENCOUNTER — Telehealth: Payer: Self-pay | Admitting: Cardiovascular Disease

## 2021-05-26 NOTE — Telephone Encounter (Signed)
Patient with diagnosis of afib on Eliquis for anticoagulation.    Procedure: Right reverse total shoulder arthroplasty  Date of procedure: 06/02/21  CHA2DS2-VASc Score = 7  This indicates a 11.2% annual risk of stroke. The patient's score is based upon: CHF History: 1 HTN History: 1 Diabetes History: 1 Stroke History: 0 Vascular Disease History: 1 Age Score: 2 Gender Score: 1   Blood clot left leg 2003  CrCl 22mL/min Platelet count 164K  Typically hold Eliquis for 3 days prior to shoulder arthroplasty. Given pt's elevated CV risk (CHADS2VASc score of 7), will defer to MD for input.

## 2021-05-26 NOTE — Telephone Encounter (Signed)
   [facility] Pre-operative Risk Assessment    Patient Name: Jenny Cooper  DOB: [birth year] MRN: [id number]  HEARTCARE STAFF:  - IMPORTANT!!!!!! Under Visit Info/Reason for Call, type in Other and utilize the format Clearance MM/DD/YY or Clearance TBD. Do not use dashes or single digits. - Please review there is not already an duplicate clearance open for this procedure. - If request is for dental extraction, please clarify the # of teeth to be extracted. - If the patient is currently at the dentist's office, call Pre-Op Callback Staff (MA/nurse) to input urgent request.  - If the patient is not currently in the dentist office, please route to the Pre-Op pool.  Request for surgical clearance:  What type of surgery is being performed? Right reverse total shoulder arthroplasty   When is this surgery scheduled? 06/02/21  What type of clearance is required (medical clearance vs. Pharmacy clearance to hold med vs. Both)? both  Are there any medications that need to be held prior to surgery and how long? Eliquis instructions   Practice name and name of physician performing surgery? [facility] - Dr Elisabeth Pigeon Poggi  What is the office phone number? [phone]   7.   What is the office fax number? [phone]  8.   Anesthesia type (None, local, MAC, general) ? Not listed    Ace Gins 05/26/2021, 1:15 PM  _________________________________________________________________   (provider comments below)

## 2021-05-28 ENCOUNTER — Encounter
Admission: RE | Admit: 2021-05-28 | Discharge: 2021-05-28 | Disposition: A | Discharge status: Home / Self Care | Payer: Medicare Other | Source: Ambulatory Visit | Attending: Surgery | Admitting: Surgery

## 2021-05-28 ENCOUNTER — Other Ambulatory Visit: Payer: Self-pay

## 2021-05-28 ENCOUNTER — Encounter: Payer: Self-pay | Admitting: Surgery

## 2021-05-28 ENCOUNTER — Other Ambulatory Visit
Admission: RE | Admit: 2021-05-28 | Discharge: 2021-05-28 | Disposition: A | Discharge status: Home / Self Care | Payer: Medicare Other | Source: Ambulatory Visit | Attending: Surgery | Admitting: Surgery

## 2021-05-28 ENCOUNTER — Telehealth: Payer: Self-pay | Admitting: Internal Medicine

## 2021-05-28 DIAGNOSIS — Z20822 Contact with and (suspected) exposure to covid-19: Secondary | ICD-10-CM | POA: Insufficient documentation

## 2021-05-28 DIAGNOSIS — Y999 Unspecified external cause status: Secondary | ICD-10-CM | POA: Diagnosis not present

## 2021-05-28 DIAGNOSIS — S42201A Unspecified fracture of upper end of right humerus, initial encounter for closed fracture: Secondary | ICD-10-CM

## 2021-05-28 DIAGNOSIS — Z01818 Encounter for other preprocedural examination: Secondary | ICD-10-CM | POA: Diagnosis present

## 2021-05-28 DIAGNOSIS — S42291A Other displaced fracture of upper end of right humerus, initial encounter for closed fracture: Secondary | ICD-10-CM | POA: Insufficient documentation

## 2021-05-28 DIAGNOSIS — Z01810 Encounter for preprocedural cardiovascular examination: Secondary | ICD-10-CM

## 2021-05-28 HISTORY — DX: Unspecified fracture of upper end of right humerus, initial encounter for closed fracture: S42.201A

## 2021-05-28 LAB — CBC WITH DIFFERENTIAL/PLATELET
Abs Immature Granulocytes: 0.01 10*3/uL (ref 0.00–0.07)
Basophils Absolute: 0 10*3/uL (ref 0.0–0.1)
Basophils Relative: 1 %
Eosinophils Absolute: 0 10*3/uL (ref 0.0–0.5)
Eosinophils Relative: 0 %
HCT: 34.1 % — ABNORMAL LOW (ref 36.0–46.0)
Hemoglobin: 11.3 g/dL — ABNORMAL LOW (ref 12.0–15.0)
Immature Granulocytes: 0 %
Lymphocytes Relative: 17 %
Lymphs Abs: 1.1 10*3/uL (ref 0.7–4.0)
MCH: 31.6 pg (ref 26.0–34.0)
MCHC: 33.1 g/dL (ref 30.0–36.0)
MCV: 95.3 fL (ref 80.0–100.0)
Monocytes Absolute: 0.6 10*3/uL (ref 0.1–1.0)
Monocytes Relative: 10 %
Neutro Abs: 4.5 10*3/uL (ref 1.7–7.7)
Neutrophils Relative %: 72 %
Platelets: 302 10*3/uL (ref 150–400)
RBC: 3.58 MIL/uL — ABNORMAL LOW (ref 3.87–5.11)
RDW: 14.6 % (ref 11.5–15.5)
WBC: 6.2 10*3/uL (ref 4.0–10.5)
nRBC: 0 % (ref 0.0–0.2)

## 2021-05-28 LAB — SURGICAL PCR SCREEN
MRSA, PCR: NEGATIVE
Staphylococcus aureus: POSITIVE — AB

## 2021-05-28 LAB — COMPREHENSIVE METABOLIC PANEL
ALT: 13 U/L (ref 0–44)
AST: 21 U/L (ref 15–41)
Albumin: 3.2 g/dL — ABNORMAL LOW (ref 3.5–5.0)
Alkaline Phosphatase: 102 U/L (ref 38–126)
Anion gap: 8 (ref 5–15)
BUN: 18 mg/dL (ref 8–23)
CO2: 29 mmol/L (ref 22–32)
Calcium: 9 mg/dL (ref 8.9–10.3)
Chloride: 100 mmol/L (ref 98–111)
Creatinine, Ser: 0.99 mg/dL (ref 0.44–1.00)
GFR, Estimated: 57 mL/min — ABNORMAL LOW (ref >60)
Glucose, Bld: 94 mg/dL (ref 70–99)
Potassium: 3.3 mmol/L — ABNORMAL LOW (ref 3.5–5.1)
Sodium: 137 mmol/L (ref 135–145)
Total Bilirubin: 0.9 mg/dL (ref 0.3–1.2)
Total Protein: 7.6 g/dL (ref 6.5–8.1)

## 2021-05-28 LAB — URINALYSIS, ROUTINE W REFLEX MICROSCOPIC
Bilirubin Urine: NEGATIVE
Glucose, UA: NEGATIVE mg/dL
Hgb urine dipstick: NEGATIVE
Ketones, ur: NEGATIVE mg/dL
Nitrite: NEGATIVE
Protein, ur: NEGATIVE mg/dL
Specific Gravity, Urine: 1.015 (ref 1.005–1.030)
pH: 5.5 (ref 5.0–8.0)

## 2021-05-28 LAB — TYPE AND SCREEN
ABO/RH(D): O POS
Antibody Screen: NEGATIVE

## 2021-05-28 LAB — APTT: aPTT: 43 seconds — ABNORMAL HIGH (ref 24–36)

## 2021-05-28 LAB — PROTIME-INR
INR: 1.3 — ABNORMAL HIGH (ref 0.8–1.2)
Prothrombin Time: 16.3 seconds — ABNORMAL HIGH (ref 11.4–15.2)

## 2021-05-28 LAB — URINALYSIS, MICROSCOPIC (REFLEX)
Bacteria, UA: NONE SEEN
RBC / HPF: NONE SEEN RBC/hpf (ref 0–5)

## 2021-05-28 NOTE — Telephone Encounter (Signed)
Received epic secure message from [place].   Gaspar Bidding would like Dr. Zoila Shutter input on clearance.   Dr. Mortimer Fries, please advise. Thanks

## 2021-05-28 NOTE — Patient Instructions (Addendum)
Your procedure is scheduled on: 06/02/21 - Tuesday Report to the Registration Desk on the 1st floor of the [facility]. To find out your arrival time, please call [phone] between 1PM - 3PM on: 06/01/21 - Monday  REMEMBER: Instructions that are not followed completely may result in serious medical risk, up to and including death; or upon the discretion of your surgeon and anesthesiologist your surgery may need to be rescheduled.  Do not eat food after midnight the night before surgery.  No gum chewing, lozengers or hard candies.  You may however, drink CLEAR liquids up to 2 hours before you are scheduled to arrive for your surgery. Do not drink anything within 2 hours of your scheduled arrival time.  Clear liquids include: - water  - apple juice without pulp - gatorade (not RED, PURPLE, OR BLUE) - black coffee or tea (Do NOT add milk or creamers to the coffee or tea) Do NOT drink anything that is not on this list.  Type 1 and Type 2 diabetics should only drink water.  TAKE THESE MEDICATIONS THE MORNING OF SURGERY WITH A SIP OF WATER:  - budesonide (PULMICORT) 0.5 MG/2ML nebulizer solution - ketorolac (ACULAR) 0.5 % ophthalmic solution - levothyroxine (SYNTHROID) 88 MCG tablet - metoprolol succinate (TOPROL-XL) 25 MG 24 hr tablet - YUPELRI 175 MCG/3ML nebulizer solution - ENTRESTO 24-26 MG - oxyCODONE (OXY IR/ROXICODONE) 5 MG immediate release tablet , may take if needed - acetaminophen (TYLENOL) 500 MG tablet if needed  Follow recommendations from Cardiologist, Pulmonologist or PCP regarding stopping Aspirin, Coumadin, Plavix, Eliquis, Pradaxa, or Pletal. Stop Eliquis beginning 05/30/21, resume per MD order.  One week prior to surgery: Stop Anti-inflammatories (NSAIDS) such as Advil, Aleve, Ibuprofen, Motrin, Naproxen, Naprosyn and Aspirin based products such as Excedrin, Goodys Powder, BC Powder.  Stop ANY OVER THE COUNTER supplements until after surgery.  You may  however, continue to take Tylenol if needed for pain up until the day of surgery.  No Alcohol for 24 hours before or after surgery.  No Smoking including e-cigarettes for 24 hours prior to surgery.  No chewable tobacco products for at least 6 hours prior to surgery.  No nicotine patches on the day of surgery.  Do not use any "recreational" drugs for at least a week prior to your surgery.  Please be advised that the combination of cocaine and anesthesia may have negative outcomes, up to and including death. If you test positive for cocaine, your surgery will be cancelled.  On the morning of surgery brush your teeth with toothpaste and water, you may rinse your mouth with mouthwash if you wish. Do not swallow any toothpaste or mouthwash.  Use CHG Soap or wipes as directed on instruction sheet.  Do not wear jewelry, make-up, hairpins, clips or nail polish.  Do not wear lotions, powders, or perfumes.   Do not shave body from the neck down 48 hours prior to surgery just in case you cut yourself which could leave a site for infection.  Also, freshly shaved skin may become irritated if using the CHG soap.  Contact lenses, hearing aids and dentures may not be worn into surgery.  Do not bring valuables to the hospital. [facility] is not responsible for any missing/lost belongings or valuables.   Total Shoulder Arthroplasty:  use Benzolyl Peroxide 5% Gel as directed on instruction sheet.  Notify your doctor if there is any change in your medical condition (cold, fever, infection).  Wear comfortable clothing (specific to your  surgery type) to the hospital.  After surgery, you can help prevent lung complications by doing breathing exercises.  Take deep breaths and cough every 1-2 hours. Your doctor may order a device called an Incentive Spirometer to help you take deep breaths. When coughing or sneezing, hold a pillow firmly against your incision with both hands. This is called "splinting."  Doing this helps protect your incision. It also decreases belly discomfort.  If you are being admitted to the hospital overnight, leave your suitcase in the car. After surgery it may be brought to your room.  If you are being discharged the day of surgery, you will not be allowed to drive home. You will need a responsible adult (18 years or older) to drive you home and stay with you that night.   If you are taking public transportation, you will need to have a responsible adult (18 years or older) with you. Please confirm with your physician that it is acceptable to use public transportation.   Please call the [facility] at [phone] if you have any questions about these instructions.  Surgery Visitation Policy:  Patients undergoing a surgery or procedure may have one family member or support person with them as long as that person is not COVID-19 positive or experiencing its symptoms.  That person may remain in the waiting area during the procedure and may rotate out with other people.  Inpatient Visitation:    Visiting hours are 7 a.m. to 8 p.m. Up to two visitors ages 16+ are allowed at one time in a patient room. The visitors may rotate out with other people during the day. Visitors must check out when they leave, or other visitors will not be allowed. One designated support person may remain overnight. The visitor must pass COVID-19 screenings, use hand sanitizer when entering and exiting the patient's room and wear a mask at all times, including in the patient's room. Patients must also wear a mask when staff or their visitor are in the room. Masking is required regardless of vaccination status.

## 2021-05-28 NOTE — Progress Notes (Signed)
Perioperative Services  Pre-Admission/Anesthesia Testing Clinical Review  Date: 05/29/21  Patient Demographics:  Name: Jenny Cooper DOB:   06-13-1936 MRN:   [id number]  Planned Surgical Procedure(s):    Case: [id number] Date/Time: 06/02/21 0715   Procedure: REVERSE SHOULDER ARTHROPLASTY (Right: Shoulder)   Anesthesia type: Choice   Pre-op diagnosis: Other closed displaced fracture of proximal end of right humerus, initial encounter S42.291A   Location: [place] 03 / [place] [facility]   Surgeons: Corky Mull, MD   NOTE: Available PAT nursing documentation and vital signs have been reviewed. Clinical nursing staff has updated patient's PMH/PSHx, current medication list, and drug allergies/intolerances to ensure comprehensive history available to assist in medical decision making as it pertains to the aforementioned surgical procedure and anticipated anesthetic course. Extensive review of available clinical information performed. [facility] PMH and PSHx updated with any diagnoses/procedures that  may have been inadvertently omitted during her intake with the pre-admission testing department's nursing staff.  Clinical Discussion:  Jenny Cooper is a 85 y.o. female who is submitted for pre-surgical anesthesia review and clearance prior to her undergoing the above procedure. Patient is a Former Smoker (40 pack years; quit 08/1999). Pertinent PMH includes: CAD (s/p CABG), MI x 2, atypical atrial flutter, PAF, SVT, chronic systolic CHF, ischemic cardiomyopathy (s/p PPM placement), aortic atherosclerosis, DVT, HTN, HLD, T2DM, hypothyroidism, COPD, respiratory failure (on supplemental oxygen), right SCLC (s/p chemoradiation), OA.  Patient is followed by cardiology Rockey Situ, MD). She was last seen in the cardiology clinic on 12/17/2020; notes reviewed.  At the time of her clinic visit, patient doing well overall from a cardiovascular perspective.  She denied any episodes of chest  pain, however had chronic shortness of breath related to her underlying COPD and history of SCLC.  Patient on supplemental oxygen at 3 L/[place].  Patient denied any PND, orthopnea, palpitations, significant peripheral edema, vertiginous symptoms, or presyncope/syncope.  PMH significant for cardiovascular diagnoses.  Records regarding cardiovascular history unavailable for review at time of consult.  Information regarding cardiac history obtained from notes from local primary cardiologist.  Patient reported to have suffered MI x 2 and 2002 with subsequent PCI is being performed with each cardiac event.  Patient has stents (unknown type) and the proximal to mid RCA, mid to distal RCA, and mid LCx.  Patient underwent two-vessel CABG procedure in 2002 with SVG-LAD and SVG-OM bypass grafts being placed.  Additionally, patient with a history of significant mitral valve regurgitation and underwent a mitral valve repair/ring in 2002.  Patient underwent SVT ablation at [facility] on 07/27/2013.  TTE performed on 04/01/2015 revealed a moderately to severely reduced left ventricular systolic function with an EF 30-35%.  There was severe anterior and inferoposterior hypokinesis.  There was moderate mitral and mild to moderate tricuspid valve regurgitation.  Left atrium mildly dilated.  PASP severely elevated at a peak pressure of 60 mmHg.  Myocardial perfusion imaging study performed on 04/17/2015 confirming the severely decreased left ventricular systolic function; EF <53%.  There was a large severe perfusion defect noted.  T wave inversions noted during stress in leads II, III, and V4.  Findings consistent with prior myocardial infarction with peri-infarct ischemia.  Study determined to be high risk.  Diagnostic left heart catheterization was performed on 04/28/2015 revealing severe three-vessel CAD; 30% stenosis of the proximal LAD-1, 100% stenosis of the proximal LAD-2, 60% stenosis of the ostial to proximal LCx, 99%  stenosis of the mid LCx (previously stented), 100% stenosis  of the ostial OM2 to OM2, 90% stenosis of the proximal to mid RCA (previously stented), and 100% stenosis of the mid to distal RCA (previously stented).  Additionally, SVG-LAD bypass graft patent, while the SVG-OM bypass graft noted to be 100% occluded.  No good options for revascularization noted.  Medical management of CAD and ischemic cardiomyopathy recommended.  Patient underwent cardiac ablation for atrial flutter on 05/10/2016 at [facility]  Repeat TTE performed on 04/06/2017 revealed severely reduced left ventricular systolic function with an EF of 30-35%.  There was severe anterior and anteroseptal hypokinesis.  There was trivial aortic and moderate mitral valve regurgitation.  Mitral valve mean gradient 4 mmHg.  Left atrium mildly dilated.  Moderately increased PASP with a peak pressure of 50 mmHg.  Patient underwent DCCV procedure on 04/08/2017 whereby a single 100 J shock converted patient from atrial fibrillation/flutter to a sinus ventricular paced rhythm.  Patient has undergone serial surveillance of her cardiac function via noninvasive studies.  Last TTE was performed on 03/06/2020 that demonstrated a low normal left ventricular systolic function with an EF of 50-55%.  There were no regional wall motion abnormalities.  PASP remained moderately elevated at 49.9 mmHg.  There was mild aortic and mitral valve regurgitation noted.  As described above, patient with an atrial fibrillation/flutter diagnosis; CHA2DS2-VASc Score = 7 (age x 2, sex, CHF, HTN, previous MI, T2DM).  With rate and rhythm controlled beta-blocker monotherapy.  Patient is on daily chronic anticoagulation using apixaban; compliant with therapy with no evidence of GI bleeding.  Heart failure and blood pressure control using daily diuretic, beta-blocker, ARB/ANRi therapies.  Patient is on a statin for her HLD.  T2DM well-controlled with diet lifestyle modification; last Hgb  A1c was 5.5% when checked on 09/28/2019.  Functional capacity limited by orthopedic pain, overall respiratory status, and age-related deconditioning; unable to achieve 4 METS of activity. No changes were made to her medication regimen.  Patient to follow-up with outpatient cardiology and 3-4 months or sooner if needed.  ADALIA PETTIS is scheduled for an elective RIGHT REVERSE SHOULDER ARTHROPLASTY on 06/02/2021 with Dr. Milagros Evener, MD. Given patient's past medical history significant for cardiovascular and cardiopulmonary diagnoses, presurgical cardiac and pulmonary clearances were sought by the PAT team.  Specialty clearances were obtained as follows.  Per pulmonary medicine, "patient is now on optimized medical management. She would be at a MODERATE to HIGH risk for pulmonary complications".  Per cardiology, "this patient is optimized for surgery and may proceed with the planned procedural course with an ACCEPTABLE risk of significant perioperative cardiovascular complications". Again, this patient is on daily anticoagulation therapy. She has been instructed on recommendations for holding her apixaban for 3 days prior to her procedure with plans to restart as soon as postoperative bleeding risk felt to be minimized by her attending surgeon. The patient has been instructed that her last dose of her anticoagulant will be on 05/29/2021.  Patient reports previous perioperative complications with anesthesia in the past.  Patient advising that she has experienced (+) episodes of postoperative hypoxia and apnea in the past. In review of the available records, it is noted that patient underwent a local + MAC anesthetic course at [facility] ([facility]) in 04/2021 without documented complications.   Vitals with BMI 05/28/2021 05/19/2021 05/19/2021  Height - - -  Weight - - -  BMI - - -  Systolic [phone]  Diastolic 56 61 71  Pulse 86 89 75  Providers/Specialists:   NOTE: Primary  physician provider listed below. Patient may have been seen by APP or partner within same practice.   PROVIDER ROLE / SPECIALTY LAST OV  Poggi, Marshall Cork, MD ORTHOPEDICS (SURGEON) 05/25/2021  Glean Hess, MD PRIMARY CARE PROVIDER 11/21/2020  Ida Rogue, MD CARDIOLOGY 12/17/2020  Flora Lipps, MD PULMONARY MEDICINE 02/17/2021   Allergies:  Cefdinir and Lovenox [enoxaparin sodium]  Current Home Medications:   No current facility-administered medications for this encounter.    acetaminophen (TYLENOL) 500 MG tablet   apixaban (ELIQUIS) 2.5 MG TABS tablet   budesonide (PULMICORT) 0.5 MG/2ML nebulizer solution   cetirizine (ZYRTEC) 10 MG tablet   ENTRESTO 24-26 MG   fluticasone (FLONASE) 50 MCG/ACT nasal spray   furosemide (LASIX) 20 MG tablet   guaiFENesin (MUCINEX) 600 MG 12 hr tablet   ipratropium-albuterol (DUONEB) 0.5-2.5 (3) MG/3ML SOLN   ketorolac (ACULAR) 0.5 % ophthalmic solution   levothyroxine (SYNTHROID) 88 MCG tablet   metoprolol succinate (TOPROL-XL) 25 MG 24 hr tablet   Multiple Vitamins-Minerals (CENTRUM SILVER PO)   oxyCODONE (OXY IR/ROXICODONE) 5 MG immediate release tablet   Phenylephrine-Cocoa Butter (PREPARATION H RE)   rosuvastatin (CRESTOR) 5 MG tablet   vitamin B-12 (CYANOCOBALAMIN) 1000 MCG tablet   Vitamins A & D (VITAMIN A & D) ointment   YUPELRI 175 MCG/3ML nebulizer solution   OXYGEN    sodium chloride 0.9 % injection 10 mL   sodium chloride flush (NS) 0.9 % injection 10 mL   History:   Past Medical History:  Diagnosis Date   Acute midline low back pain without sciatica 08/13/2016   Acute on chronic respiratory failure with hypoxia (HCC) 08/21/2018   Acute respiratory failure with hypoxia (HCC) 12/11/2017   Allergy    Aortic atherosclerosis (HCC)    Atypical atrial flutter (HCC)    a.) s/p ablation 07/27/2013 and 05/10/2016   Balance problem    CAD (coronary artery disease)    a. s/p MI x 2 in 2002 s/p PCI x 2 in 2002; b. s/p 2v CABG  2002; c. stress echo 07/2004 w/ evi of pos & inf infarct & no evi of ischemia; d. 4/08 dipyridamole scan w/ multiple areas of infarct, no ischemia, EF 49%; e. cath 04/28/15 3v CAD, med Rx rec, no targets for revasc, LM lum irregs, pLAD 30%, 100%, ost-pLCx 60%, [name] 99%, OM2 100%, p-mRCA 90%, [name] 100% L-R collats, VG-mLAD irregs, VG-OM2 oc   Chronic anticoagulation    a.) Apixaban   Chronic systolic CHF (congestive heart failure) ([place])    a. echo 03/2015: EF 30-35%, sev ant/inf/pos HK, in mild to mod MR   Complication of anesthesia    a.) postoperative apnea. b.) postoperative hypoxia.   Compressed spine fracture ([place]) 08/06/2016   a.) L2, T11, T12   COPD (chronic obstructive pulmonary disease) (HCC)    Deep vein thrombosis (DVT) of left lower extremity ([place]) 12/2001   Fracture of multiple pubic rami, right, closed, initial encounter ([place]) 05/27/2016   a.) RIGHT superior and inferior pubic rami fractures s/p mechanical fall.   GERD (gastroesophageal reflux disease)    HLD (hyperlipidemia)    HTN (hypertension)    Hypothyroidism    Ischemic cardiomyopathy    Mitral regurgitation    a. s/p mitral ring placement 09/2000; b. echo 09/2010: EF 50%, inf HK, post AK, mild MR, prosthetic mitral valve ring w/ peak gradient of 10 mmHg; b. echo 2/13: EF 50%, mild MR/TR      Myocardial infarction (  [place]) 2002   a.) x 2 in 2002; PCI with stents (unknown type) placed to the p-mRCA, [name], and [name].   Neuropathy    Osteoarthritis    Osteoporosis    Oxygen dependent    a.) 3 L/[place]   Pacemaker    a. MDT 2002; b. generator replacement 2013; c. followed by Dr. Omelia Blackwater, MD   PAF (paroxysmal atrial fibrillation) [facility])    a.) s/p DCCV on 04/08/2017. b.) on apixaban   Personal history of chemotherapy    Personal history of radiation therapy    Pneumonia    S/P CABG x 2 2002   a.) SVG-LAD, SVG-OM   Squamous cell carcinoma of right lung ([place]) 01/03/2015   a.) stage IIIa. b.) s/p concurrent chemotherapy  (carboplatin + paclitaxel) and XRT (IMRT 6000 cGy)   SVT (supraventricular tachycardia) (HCC)    Type II diabetes mellitus with complication ([place]) 27/78/2423   Past Surgical History:  Procedure Laterality Date   APPENDECTOMY     CARDIAC CATHETERIZATION N/A 04/28/2015   Procedure: Left Heart Cath and Coronary Angiography;  Surgeon: Wellington Hampshire, MD;  Location: [place] [facility];  Service: Cardiovascular;  Laterality: N/A;   CARDIAC ELECTROPHYSIOLOGY STUDY AND ABLATION N/A 05/10/2016   Procedure(s): INTRACARDIAC ELECTROPHYSIOLOGIC 3D MAPPING, STIMULATION PACING HEART, ICAR CATHETER ABLATION ARRHYTHMIA ADD ON, ABLATE L/R ATRIAL FIBRIL W/ISOLATED PULM VEIN, INTRACARD ECHO, THER/DX INTERVENT; Location: [facility]; Surgeon: Norm Salt, MD   CARDIOVERSION N/A 04/08/2017   Procedure: CARDIOVERSION;  Surgeon: Wellington Hampshire, MD;  Location: [facility];  Service: Cardiovascular;  Laterality: N/A;   CHEST TUBE INSERTION N/A 07/11/2018   Procedure: NTIRWE CATH INSERTION;  Surgeon: [name], MD;  Location: [facility];  Service: Thoracic;  Laterality: N/A;   COLONOSCOPY  12/2009   2 small tubular adenomas   CORONARY ANGIOPLASTY WITH STENT PLACEMENT Left 2002   s/p MI x 2 with subsequent PCI x 2 ; stents (unknown type) placed to the p-mRCA, [name], and [name].   CORONARY ARTERY BYPASS GRAFT  09/2000   DRAIN REMOVAL Right 08/10/2018   Procedure: DRAIN REMOVAL;  Surgeon: [name], MD;  Location: [facility];  Service: General;  Laterality: Right;   ECTOPIC PREGNANCY SURGERY     ELECTROMAGNETIC NAVIGATION BROCHOSCOPY N/A 10/06/2015   Procedure: ELECTROMAGNETIC NAVIGATION BRONCHOSCOPY;  Surgeon: Flora Lipps, MD;  Location: [facility];  Service: Cardiopulmonary;  Laterality: N/A;   ENDOBRONCHIAL ULTRASOUND N/A 10/06/2015   Procedure: ENDOBRONCHIAL ULTRASOUND;  Surgeon: Flora Lipps, MD;  Location: [facility];  Service: Cardiopulmonary;  Laterality: N/A;   EYE SURGERY     FLEXIBLE BRONCHOSCOPY Right  07/11/2018   Procedure: FLEXIBLE BRONCHOSCOPY;  Surgeon: [name], MD;  Location: [facility];  Service: Thoracic;  Laterality: Right;   KYPHOPLASTY N/A 09/28/2016   Procedure: KYPHOPLASTY;  Surgeon: Hessie Knows, MD;  Location: [facility];  Service: Orthopedics;  Laterality: N/A;   KYPHOPLASTY N/A 02/20/2018   Procedure: [id number];  Surgeon: Hessie Knows, MD;  Location: [facility];  Service: Orthopedics;  Laterality: N/A;   PACEMAKER INSERTION  03/2012   SVT ABLATION N/A 07/27/2013   Procedure: SVT ABLATION; Location: [facility]; Surgeon: Rolla Etienne, MD   thorocentesis  12/24/2016   VAGINAL HYSTERECTOMY     partial - left ovary remains   VIDEO ASSISTED THORACOSCOPY Right 07/11/2018   Procedure: VIDEO ASSISTED THORACOSCOPY;  Surgeon: [name], MD;  Location: [facility];  Service: Thoracic;  Laterality: Right;   VIDEO ASSISTED THORACOSCOPY (VATS) W/TALC PLEUADESIS Right 07/11/2018   Procedure: THORACOTOMY PLEURAL  BIOPSY WITH TALC PLEURODESIS;  Surgeon: [name], MD;  Location: [facility];  Service: Thoracic;  Laterality: Right;  pleural biopsy   Family History  Problem Relation Age of Onset   COPD Mother        sister, and brother   Lung disease Father    Stroke Maternal Grandmother    Hypertension Sister    COPD Sister    Hypertension Brother    COPD Brother    Colon cancer Brother    Heart attack Neg Hx    Breast cancer Neg Hx    Social History   Tobacco Use   Smoking status: Former    Packs/day: 1.00    Years: 40.00    Pack years: 40.00    Types: Cigarettes    Quit date: 2001    Years since quitting: 21.7   Smokeless tobacco: Never   Tobacco comments:    quit smoking in 08/28/2000. Smoking cessation materials not required  Vaping Use   Vaping Use: Never used  Substance Use Topics   Alcohol use: Yes    Alcohol/week: 10.0 standard drinks    Types: 10 Glasses of wine per week    Comment: 1 glass of wine per day   Drug use: No    Pertinent Clinical  Results:  LABS: Labs reviewed: Acceptable for surgery.  Hospital Outpatient Visit on 05/28/2021  Component Date Value Ref Range Status   MRSA, PCR 05/28/2021 NEGATIVE  NEGATIVE Final   Staphylococcus aureus 05/28/2021 POSITIVE (A) NEGATIVE Final   Comment: (NOTE) The Xpert SA Assay (FDA approved for NASAL specimens in patients 81 years of age and older), is one component of a comprehensive surveillance program. It is not intended to diagnose infection nor to guide or monitor treatment. Performed at [facility], [address] [place], [place] [address]    aPTT 05/28/2021 43 (A) 24 - 36 seconds Final   Comment:        IF BASELINE aPTT IS ELEVATED, SUGGEST PATIENT RISK ASSESSMENT BE USED TO DETERMINE APPROPRIATE ANTICOAGULANT THERAPY. Performed at [facility], [address] [place], [place] [address]    Prothrombin Time 05/28/2021 16.3 (A) 11.4 - 15.2 seconds Final   INR 05/28/2021 1.3 (A) 0.8 - 1.2 Final   Comment: (NOTE) INR goal varies based on device and disease states. Performed at [facility], [address] [place],  [address]    WBC 05/28/2021 6.2  4.0 - 10.5 K/uL Final   RBC 05/28/2021 3.58 (A) 3.87 - 5.11 MIL/uL Final   Hemoglobin 05/28/2021 11.3 (A) 12.0 - 15.0 g/dL Final   HCT 05/28/2021 34.1 (A) 36.0 - 46.0 % Final   MCV 05/28/2021 95.3  80.0 - 100.0 fL Final   MCH 05/28/2021 31.6  26.0 - 34.0 pg Final   MCHC 05/28/2021 33.1  30.0 - 36.0 g/dL Final   RDW 05/28/2021 14.6  11.5 - 15.5 % Final   Platelets 05/28/2021 302  150 - 400 K/uL Final   nRBC 05/28/2021 0.0  0.0 - 0.2 % Final   Neutrophils Relative % 05/28/2021 72  % Final   Neutro Abs 05/28/2021 4.5  1.7 - 7.7 K/uL Final   Lymphocytes Relative 05/28/2021 17  % Final   Lymphs Abs 05/28/2021 1.1  0.7 - 4.0 K/uL Final   Monocytes Relative 05/28/2021 10  % Final   Monocytes Absolute 05/28/2021 0.6  0.1 - 1.0 K/uL Final   Eosinophils Relative 05/28/2021 0  % Final  Eosinophils Absolute 05/28/2021 0.0  0.0 - 0.5 K/uL Final   Basophils Relative 05/28/2021 1  % Final   Basophils Absolute 05/28/2021 0.0  0.0 - 0.1 K/uL Final   Immature Granulocytes 05/28/2021 0  % Final   Abs Immature Granulocytes 05/28/2021 0.01  0.00 - 0.07 K/uL Final   Performed at [facility], [place], [place] [address]   Sodium 05/28/2021 137  135 - 145 mmol/L Final   Potassium 05/28/2021 3.3 (A) 3.5 - 5.1 mmol/L Final   Chloride 05/28/2021 100  98 - 111 mmol/L Final   CO2 05/28/2021 29  22 - 32 mmol/L Final   Glucose, Bld 05/28/2021 94  70 - 99 mg/dL Final   Glucose reference range applies only to samples taken after fasting for at least 8 hours.   BUN 05/28/2021 18  8 - 23 mg/dL Final   Creatinine, Ser 05/28/2021 0.99  0.44 - 1.00 mg/dL Final   Calcium 05/28/2021 9.0  8.9 - 10.3 mg/dL Final   Total Protein 05/28/2021 7.6  6.5 - 8.1 g/dL Final   Albumin 05/28/2021 3.2 (A) 3.5 - 5.0 g/dL Final   AST 05/28/2021 21  15 - 41 U/L Final   ALT 05/28/2021 13  0 - 44 U/L Final   Alkaline Phosphatase 05/28/2021 102  38 - 126 U/L Final   Total Bilirubin 05/28/2021 0.9  0.3 - 1.2 mg/dL Final   GFR, Estimated 05/28/2021 57 (A) >60 mL/min Final   Comment: (NOTE) Calculated using the CKD-EPI Creatinine Equation (2021)    Anion gap 05/28/2021 8  5 - 15 Final   Performed at [facility], [address] [place], [place] [address]   Color, Urine 05/28/2021 YELLOW  YELLOW Final   YELLOW   APPearance 05/28/2021 CLEAR  CLEAR Final   CLEAR   Specific Gravity, Urine 05/28/2021 1.015  1.005 - 1.030 Final   pH 05/28/2021 5.5  5.0 - 8.0 Final   Glucose, UA 05/28/2021 NEGATIVE  NEGATIVE mg/dL Final   Hgb urine dipstick 05/28/2021 NEGATIVE  NEGATIVE Final   Bilirubin Urine 05/28/2021 NEGATIVE  NEGATIVE Final   Ketones, ur 05/28/2021 NEGATIVE  NEGATIVE mg/dL Final   Protein, ur 05/28/2021 NEGATIVE  NEGATIVE mg/dL Final   Nitrite 05/28/2021 NEGATIVE  NEGATIVE  Final   Leukocytes,Ua 05/28/2021 SMALL (A) NEGATIVE Final   Performed at [facility], [address] [place], [place] [address]   ABO/RH(D) 05/28/2021 O POS   Final   Antibody Screen 05/28/2021 NEG   Final   Sample Expiration 05/28/2021 06/11/2021,2359   Final   Extend sample reason 05/28/2021    Final                   Value:NO TRANSFUSIONS OR PREGNANCY IN THE PAST 3 MONTHS Performed at [facility], [address] [place], [place] [address]    SARS Coronavirus 2 05/28/2021 NEGATIVE  NEGATIVE Final   Comment: (NOTE) SARS-CoV-2 target nucleic acids are NOT DETECTED.   RBC / HPF 05/28/2021 NONE SEEN  0 - 5 RBC/hpf Final   WBC, UA 05/28/2021 0-5  0 - 5 WBC/hpf Final   Bacteria, UA 05/28/2021 NONE SEEN  NONE SEEN Final   Squamous Epithelial / LPF 05/28/2021 0-5  0 - 5 Final   Performed at [facility], [address] [place], [place] [address]    ECG: Date: 05/28/2021 Time ECG obtained: 0942 AM Rate: 89 bpm Rhythm: NSR with 1st degree AV block Axis (leads  I and aVF): Right axis deviation Intervals: QRS 98 ms. QTc 399 ms. ST segment and T wave changes: No evidence of acute ST segment elevation or depression. Evidence of an age undetermined inferior infarct present.  Comparison: Similar to previous tracing obtained on 12/17/2020   IMAGING / PROCEDURES: CT SHOULDER RIGHT WITHOUT CONTRAST performed on 05/25/2021 Acute comminuted impacted fracture of the humeral head and neck with involvement of the greater and lesser tuberosities Inferior humeral head subluxation Subacromial/subdeltoid lipohemobursitis  DIAGNOSTIC RADIOGRAPHS CHEST PORTABLE 1 VIEW performed on 05/19/2021 Comminuted displaced proximal RIGHT humeral fracture Enlargement of the cardiac silhouette post pacemaker, CABG, and MVR Chronic interstitial lung disease changes at both lung bases with chronic scarring in the RIGHT mid lung  CT CERVICAL SPINE WITHOUT CONTRAST performed on  05/19/2021 Multilevel degenerative disc and facet disease changes of the cervical spine No acute cervical spine abnormalities Asymmetric RIGHT apical lung scarring with pleural parenchymal thickening  CT HEAD WITHOUT CONTRAST performed on 05/19/2021 No acute intracranial findings Chronic BILATERAL sphenoid sinus disease.  Partial opacification within the RIGHT mastoid air cells.  TRANSTHORACIC ECHOCARDIOGRAM performed on 03/06/2021 Left ventricular ejection fraction, by estimation, is 50 to 55%. The left ventricle has low normal function. The left ventricle has no regional  wall motion abnormalities. Left ventricular diastolic parameters are indeterminate.  Right ventricular systolic function is normal. The right ventricular size is normal. There is moderately elevated pulmonary artery systolic pressure. The estimated right ventricular systolic pressure is 79.8 mmHg.  Mild mitral valve regurgitation.  Aortic valve regurgitation is mild.   LEFT HEART CATHETERIZATION AND CORONARY ANGIOGRAPHY performed on 04/28/2015 Severe three-vessel CAD 30% stenosis of the proximal LAD-1 100% stenosis of the proximal LAD-2 60% stenosis of the ostial to proximal LCx 99% stenosis of the mid LCx (previously stented) 100% stenosis of the ostial OM2 to OM2 90% stenosis of the proximal to mid RCA 100% stenosis of the mid to distal RCA Previously placed bypass grafts SVG-LAD graft patent SVG-OM graft occluded Native RCA chronically occluded with left-to-right collaterals Native LCx with diffuse disease and overall small in caliber Moderately reduced left ventricular function with an ejection fraction of 35-40% Mildly elevated LVEDP Recommendations: optimize medical therapy for CAD and ischemic cardiomyopathy.     LEXISCAN performed on 04/17/2015 Left ventricular ejection fraction is severely decreased at <30% Large defect of severe severity present in the basal inferolateral and mid inferolateral  location T wave inversions noted during stress in leads II, III, and V4 Findings consistent with prior myocardial infarction with peri-infarct ischemia High risk study  Impression and Plan:  Jenny Cooper has been referred for pre-anesthesia review and clearance prior to her undergoing the planned anesthetic and procedural courses. Available labs, pertinent testing, and imaging results were personally reviewed by me. This patient has been appropriately cleared by cardiology (ACCEPTABLE) and pulmonary medicine (MODERATE to HIGH) risk of significant perioperative complications. Completed perioperative prescription for cardiac device management documentation completed by primary cardiology team and placed on patient's chart for review by the surgical/anesthetic team on the day of her procedure.   Based on clinical review performed today (05/29/21), barring any significant acute changes in the patient's overall condition, it is anticipated that she will be able to proceed with the planned surgical intervention. Any acute changes in clinical condition may necessitate her procedure being postponed and/or cancelled. Patient will meet with anesthesia team (MD and/or CRNA) on the day of her procedure for preoperative evaluation/assessment. Questions regarding anesthetic course will be fielded at  that time.   Pre-surgical instructions were reviewed with the patient during her PAT appointment and questions were fielded by PAT clinical staff. Patient was advised that if any questions or concerns arise prior to her procedure then she should return a call to PAT and/or her surgeon's office to discuss.  Honor Loh, MSN, APRN, FNP-C, CEN [facility]  Peri-operative Services Nurse Practitioner Phone: [phone] Fax: [phone] 05/29/21 10:53 PM  NOTE: This note has been prepared using Dragon dictation software. Despite my best ability to proofread, there is always the potential that  unintentional transcriptional errors may still occur from this process.

## 2021-05-28 NOTE — Telephone Encounter (Signed)
Received surgical clearance form.  Patient last seen 01/2020.  Spoke to patient and advised her that an appt is needed prior to clearance.  Our first available appt is 06/16/2021. She did not wish to travel to [facility] for sooner appt. Patient stated that she is going to ' fight this' because pulmonary does not manage Eliquis and clearance from pulmonary should not be needed.  Routing to [employer] as an [place].

## 2021-05-28 NOTE — Progress Notes (Signed)
Perioperative Services Pre-Admission/Anesthesia Testing   Date: 05/28/21 Name: Jenny Cooper MRN:   [id number]  Re: Consideration of preoperative prophylactic antibiotic change   Request sent to: Poggi, Marshall Cork, MD (routed and/or faxed via [facility])  Planned Surgical Procedure(s):   Case: [id number] Date/Time: 06/02/21 0277  Procedure: REVERSE SHOULDER ARTHROPLASTY (Right: Shoulder)  Anesthesia type: Choice  Pre-op diagnosis: Other closed displaced fracture of proximal end of right humerus, initial encounter S42.291A  Location: [place] [facility] [facility]  Surgeons: Corky Mull, MD   Clinical Notes:  Patient has a questionable allergy to CEFDINIR  Advising that PCN has caused her to experience urticarial rash in the past after completing a course of this medication.   Received cephalosporin with no documented complications CEFAZOLIN received on 04/24/2012, 07/27/2013, 09/28/2016, 02/20/2018, 07/11/2018, and 08/10/2018 without documented ADRs.   Screened as appropriate for cephalosporin use during medication reconciliation No immediate angioedema, dysphagia, SOB, anaphylaxis symptoms. No severe rash involving mucous membranes or skin necrosis. No hospital admissions related to side effects of PCN/cephalosporin use.  No documented reaction to PCN or cephalosporin in the last 10 years.  Request:  As an evidence based approach to reducing the rate of incidence for post-operative SSI and the development of MDROs, could an agent with narrower coverage for preoperative prophylaxis in this patient's upcoming surgical course be considered?   Currently ordered preoperative prophylactic ABX: clindamycin.   Specifically requesting change to cephalosporin (CEFAZOLIN).   Please communicate decision with me and I will change the orders in Epic as per your direction.   Things to consider: Many patients report that they were "allergic" to PCN earlier in life, however this  does not translate into a true lifelong allergy. Patients can lose sensitivity to specific IgE antibodies over time if PCN is avoided (Kleris & Lugar, 2019).  Up to 10% of the adult population and 15% of hospitalized patients report an allergy to PCN, however clinical studies suggest that 90% of those reporting an allergy can tolerate PCN antibiotics (Kleris & Lugar, 2019).  Cross-sensitivity between PCN and cephalosporins has been documented as being as high as 10%, however this estimation included data believed to have been collected in a setting where there was contamination. Newer data suggests that the prevalence of cross-sensitivity between PCN and cephalosporins is actually estimated to be closer to 1% (Hermanides et al., 2018).   Patients labeled as PCN allergic, whether they are truly allergic or not, have been found to have inferior outcomes in terms of rates of serious infection, and these patients tend to have longer hospital stays ([place], 2019).  Treatment related secondary infections, such as Clostridioides difficile, have been linked to the improper use of broad spectrum antibiotics in patients improperly labeled as PCN allergic (Kleris & Lugar, 2019).  Anaphylaxis from cephalosporins is rare and the evidence suggests that there is no increased risk of an anaphylactic type reaction when cephalosporins are used in a PCN allergic patient (Pichichero, 2006).  Citations: Hermanides J, Lemkes BA, Prins Pearla Dubonnet MW, [place] I. Presumed ?-Lactam Allergy and Cross-reactivity in the Operating Theater: A Practical Approach. Anesthesiology. 2018 Aug;129(2):335-342. doi: 10.1097/ALN.0000000000002252. PMID: 41287867.  Kleris, [place]., & Lugar, P. L. (2019). Things We Do For No Reason: Failing to Question a Penicillin Allergy History. Journal of hospital medicine, 14(10), 302 255 0891. Advance online publication. https://www.wallace-middleton.info/  Pichichero, M. E. (2006). Cephalosporins can  be prescribed safely for penicillin-allergic patients. Journal of family medicine, 55(2), 106-112. Accessed: https://cdn.mdedge.com/files/s38fs-public/Document/September-2017/5502JFP_AppliedEvidence1.pdf  Honor Loh, MSN, APRN, FNP-C, CEN [facility]  Peri-operative Services Nurse Practitioner FAX: [phone] 05/28/21 9:54 AM

## 2021-05-28 NOTE — Progress Notes (Signed)
  Perioperative Services Pre-Admission/Anesthesia Testing     Date: 05/28/21  Name: Jenny Cooper MRN:   [id number]  Re: Change in [place] for upcoming surgery   Case: [id number] Date/Time: 06/02/21 0715   Procedure: REVERSE SHOULDER ARTHROPLASTY (Right: Shoulder)   Anesthesia type: Choice   Pre-op diagnosis: Other closed displaced fracture of proximal end of right humerus, initial encounter S42.291A   Location: [place] [facility] [place] [facility]   Surgeons: Corky Mull, MD     Primary attending surgeon was consulted regarding consideration of therapeutic change in antimicrobial agent being used for preoperative prophylaxis in this patient's upcoming surgical case. Following analysis of the risk versus benefits, Dr. Roland Rack, Marshall Cork, MD advising that it would be acceptable to discontinue the ordered clindamycin and place an order for cefazolin 2 gm IV on call to the OR. Orders for this patient were amended by me following collaborative conversation with attending surgeon taking into consideration of risk versus benefits of the therapy change.  Honor Loh, MSN, APRN, FNP-C, CEN [facility]  Peri-operative Services Nurse Practitioner Phone: [phone] 05/28/21 3:29 PM

## 2021-05-29 ENCOUNTER — Other Ambulatory Visit: Admission: RE | Admit: 2021-05-29 | Payer: Medicare Other | Source: Ambulatory Visit

## 2021-05-29 LAB — SARS CORONAVIRUS 2 (TAT 6-24 HRS): SARS Coronavirus 2: NEGATIVE

## 2021-05-29 NOTE — Telephone Encounter (Signed)
Routing to [place] as an [employer]

## 2021-05-29 NOTE — Telephone Encounter (Signed)
   Name: Jenny Cooper  DOB: 01-17-1936  MRN: [id number]   Primary Cardiologist: Ida Rogue, MD  Chart reviewed as part of pre-operative protocol coverage.   Per Dr. Rockey Situ:  Acceptable risk for surgery  Okay to hold anticoagulation 3 days per pharmacy    Therefore, based on ACC/AHA guidelines, the patient would be at acceptable risk for the planned procedure without further cardiovascular testing.    I will route this recommendation to the requesting party via Epic fax function and remove from pre-op pool. Please call with questions.  [place], PA 05/29/2021, 5:04 PM

## 2021-06-02 ENCOUNTER — Encounter: Payer: Self-pay | Admitting: Surgery

## 2021-06-02 ENCOUNTER — Encounter
Admission: RE | Disposition: A | Discharge status: Home / Self Care | Payer: Self-pay | Source: Home / Self Care | Attending: Surgery

## 2021-06-02 ENCOUNTER — Observation Stay
Admission: RE | Admit: 2021-06-02 | Discharge: 2021-06-03 | Disposition: A | Discharge status: Home / Self Care | Payer: Medicare Other | Attending: Surgery | Admitting: Surgery

## 2021-06-02 ENCOUNTER — Inpatient Hospital Stay: Payer: Medicare Other | Admitting: Urgent Care

## 2021-06-02 ENCOUNTER — Observation Stay: Payer: Medicare Other

## 2021-06-02 ENCOUNTER — Other Ambulatory Visit: Payer: Self-pay

## 2021-06-02 DIAGNOSIS — W06XXXA Fall from bed, initial encounter: Secondary | ICD-10-CM | POA: Diagnosis not present

## 2021-06-02 DIAGNOSIS — E039 Hypothyroidism, unspecified: Secondary | ICD-10-CM | POA: Diagnosis not present

## 2021-06-02 DIAGNOSIS — I5042 Chronic combined systolic (congestive) and diastolic (congestive) heart failure: Secondary | ICD-10-CM | POA: Insufficient documentation

## 2021-06-02 DIAGNOSIS — S42291A Other displaced fracture of upper end of right humerus, initial encounter for closed fracture: Principal | ICD-10-CM | POA: Insufficient documentation

## 2021-06-02 DIAGNOSIS — Z951 Presence of aortocoronary bypass graft: Secondary | ICD-10-CM | POA: Insufficient documentation

## 2021-06-02 DIAGNOSIS — E119 Type 2 diabetes mellitus without complications: Secondary | ICD-10-CM | POA: Insufficient documentation

## 2021-06-02 DIAGNOSIS — Z96611 Presence of right artificial shoulder joint: Secondary | ICD-10-CM

## 2021-06-02 DIAGNOSIS — Z7901 Long term (current) use of anticoagulants: Secondary | ICD-10-CM | POA: Diagnosis not present

## 2021-06-02 DIAGNOSIS — I110 Hypertensive heart disease with heart failure: Secondary | ICD-10-CM | POA: Diagnosis not present

## 2021-06-02 DIAGNOSIS — I480 Paroxysmal atrial fibrillation: Secondary | ICD-10-CM | POA: Insufficient documentation

## 2021-06-02 DIAGNOSIS — Z79899 Other long term (current) drug therapy: Secondary | ICD-10-CM | POA: Insufficient documentation

## 2021-06-02 DIAGNOSIS — J449 Chronic obstructive pulmonary disease, unspecified: Secondary | ICD-10-CM | POA: Insufficient documentation

## 2021-06-02 DIAGNOSIS — I2510 Atherosclerotic heart disease of native coronary artery without angina pectoris: Secondary | ICD-10-CM | POA: Insufficient documentation

## 2021-06-02 HISTORY — PX: REVERSE SHOULDER ARTHROPLASTY: SHX5054

## 2021-06-02 HISTORY — DX: Unspecified osteoarthritis, unspecified site: M19.90

## 2021-06-02 HISTORY — DX: Long term (current) use of anticoagulants: Z79.01

## 2021-06-02 HISTORY — DX: Atherosclerosis of aorta: I70.0

## 2021-06-02 HISTORY — DX: Supraventricular tachycardia, unspecified: I47.10

## 2021-06-02 HISTORY — DX: Dependence on supplemental oxygen: Z99.81

## 2021-06-02 HISTORY — DX: Supraventricular tachycardia: I47.1

## 2021-06-02 HISTORY — DX: Ischemic cardiomyopathy: I25.5

## 2021-06-02 HISTORY — DX: Age-related osteoporosis without current pathological fracture: M81.0

## 2021-06-02 SURGERY — ARTHROPLASTY, SHOULDER, TOTAL, REVERSE
Anesthesia: General | Site: Shoulder | Laterality: Right

## 2021-06-02 MED ORDER — FLUTICASONE PROPIONATE 50 MCG/ACT NA SUSP
1.0000 | Freq: Every day | NASAL | Status: DC | PRN
  Filled 2021-06-02: qty 16

## 2021-06-02 MED ORDER — ONDANSETRON HCL 4 MG/2ML IJ SOLN: 4.0000 mg | Freq: Four times a day (QID) | INTRAMUSCULAR | Status: DC | PRN

## 2021-06-02 MED ORDER — EPHEDRINE 5 MG/ML INJ
INTRAVENOUS | Status: AC
  Filled 2021-06-02: qty 5

## 2021-06-02 MED ORDER — ACETAMINOPHEN 325 MG PO TABS: 325.0000 mg | ORAL_TABLET | Freq: Four times a day (QID) | ORAL | Status: DC | PRN

## 2021-06-02 MED ORDER — CEFAZOLIN SODIUM-DEXTROSE 2-4 GM/100ML-% IV SOLN
2.0000 g | Freq: Four times a day (QID) | INTRAVENOUS | Status: AC
  Administered 2021-06-02 – 2021-06-03 (×3): 2 g via INTRAVENOUS
  Filled 2021-06-02 (×2): qty 100

## 2021-06-02 MED ORDER — SODIUM CHLORIDE 0.9 % IV SOLN
INTRAVENOUS | Status: DC | PRN
  Administered 2021-06-02: 20 ug/min via INTRAVENOUS
  Administered 2021-06-02: 40 ug/min via INTRAVENOUS

## 2021-06-02 MED ORDER — ONDANSETRON HCL 4 MG/2ML IJ SOLN
4.0000 mg | Freq: Once | INTRAMUSCULAR | Status: DC | PRN
Start: 2021-06-02 — End: 2021-06-02

## 2021-06-02 MED ORDER — ROCURONIUM BROMIDE 100 MG/10ML IV SOLN
INTRAVENOUS | Status: DC | PRN
  Administered 2021-06-02: 60 mg via INTRAVENOUS
  Administered 2021-06-02: 10 mg via INTRAVENOUS

## 2021-06-02 MED ORDER — DOCUSATE SODIUM 100 MG PO CAPS
100.0000 mg | ORAL_CAPSULE | Freq: Two times a day (BID) | ORAL | Status: DC
  Administered 2021-06-02 – 2021-06-03 (×2): 100 mg via ORAL
  Filled 2021-06-02 (×2): qty 1

## 2021-06-02 MED ORDER — PHENYLEPHRINE HCL (PRESSORS) 10 MG/ML IV SOLN
INTRAVENOUS | Status: AC
  Filled 2021-06-02: qty 1

## 2021-06-02 MED ORDER — FUROSEMIDE 20 MG PO TABS
20.0000 mg | ORAL_TABLET | Freq: Every day | ORAL | Status: DC
  Administered 2021-06-03: 20 mg via ORAL
  Filled 2021-06-02: qty 1

## 2021-06-02 MED ORDER — BUPIVACAINE LIPOSOME 1.3 % IJ SUSP
INTRAMUSCULAR | Status: AC
  Filled 2021-06-02: qty 20

## 2021-06-02 MED ORDER — BUDESONIDE 0.5 MG/2ML IN SUSP
0.5000 mg | Freq: Two times a day (BID) | RESPIRATORY_TRACT | Status: DC
  Administered 2021-06-02 – 2021-06-03 (×2): 0.5 mg via RESPIRATORY_TRACT
  Filled 2021-06-02 (×2): qty 2

## 2021-06-02 MED ORDER — OXYCODONE HCL 5 MG PO TABS: 5.0000 mg | ORAL_TABLET | Freq: Once | ORAL | Status: DC | PRN

## 2021-06-02 MED ORDER — IPRATROPIUM-ALBUTEROL 0.5-2.5 (3) MG/3ML IN SOLN: 3.0000 mL | Freq: Four times a day (QID) | RESPIRATORY_TRACT | Status: DC | PRN

## 2021-06-02 MED ORDER — FENTANYL CITRATE (PF) 100 MCG/2ML IJ SOLN
INTRAMUSCULAR | Status: AC
  Filled 2021-06-02: qty 2

## 2021-06-02 MED ORDER — METOPROLOL SUCCINATE ER 50 MG PO TB24
50.0000 mg | ORAL_TABLET | Freq: Two times a day (BID) | ORAL | Status: DC
  Administered 2021-06-02 – 2021-06-03 (×2): 50 mg via ORAL
  Filled 2021-06-02 (×2): qty 1

## 2021-06-02 MED ORDER — ACETAMINOPHEN 500 MG PO TABS
1000.0000 mg | ORAL_TABLET | Freq: Four times a day (QID) | ORAL | Status: AC
  Administered 2021-06-02 – 2021-06-03 (×4): 1000 mg via ORAL
  Filled 2021-06-02 (×4): qty 2

## 2021-06-02 MED ORDER — ADULT MULTIVITAMIN W/MINERALS CH
1.0000 | ORAL_TABLET | Freq: Every day | ORAL | Status: DC
  Administered 2021-06-03: 1 via ORAL
  Filled 2021-06-02: qty 1

## 2021-06-02 MED ORDER — REVEFENACIN 175 MCG/3ML IN SOLN
175.0000 ug | Freq: Every day | RESPIRATORY_TRACT | Status: DC
  Administered 2021-06-03: 175 ug via RESPIRATORY_TRACT
  Filled 2021-06-02: qty 3

## 2021-06-02 MED ORDER — SODIUM CHLORIDE 0.9 % IR SOLN
Status: DC | PRN
  Administered 2021-06-02: 3000 mL

## 2021-06-02 MED ORDER — ONDANSETRON HCL 4 MG/2ML IJ SOLN
INTRAMUSCULAR | Status: DC | PRN
Start: 2021-06-02 — End: 2021-06-02
  Administered 2021-06-02: 4 mg via INTRAVENOUS

## 2021-06-02 MED ORDER — CEFAZOLIN SODIUM-DEXTROSE 2-4 GM/100ML-% IV SOLN
2.0000 g | Freq: Once | INTRAVENOUS | Status: AC
  Administered 2021-06-02: 2 g via INTRAVENOUS

## 2021-06-02 MED ORDER — VITAMIN B-12 1000 MCG PO TABS
1000.0000 ug | ORAL_TABLET | Freq: Every day | ORAL | Status: DC
  Administered 2021-06-03: 1000 ug via ORAL
  Filled 2021-06-02: qty 1

## 2021-06-02 MED ORDER — CHLORHEXIDINE GLUCONATE 0.12 % MT SOLN: 15.0000 mL | Freq: Once | OROMUCOSAL | Status: AC

## 2021-06-02 MED ORDER — ACETAMINOPHEN 10 MG/ML IV SOLN: 1000.0000 mg | Freq: Once | INTRAVENOUS | Status: DC | PRN

## 2021-06-02 MED ORDER — ACETAMINOPHEN 10 MG/ML IV SOLN
INTRAVENOUS | Status: AC
  Administered 2021-06-02: 1000 mg via INTRAVENOUS
  Filled 2021-06-02: qty 100

## 2021-06-02 MED ORDER — LEVOTHYROXINE SODIUM 88 MCG PO TABS
88.0000 ug | ORAL_TABLET | Freq: Every day | ORAL | Status: DC
  Administered 2021-06-03: 88 ug via ORAL
  Filled 2021-06-02: qty 1

## 2021-06-02 MED ORDER — 0.9 % SODIUM CHLORIDE (POUR BTL) OPTIME
TOPICAL | Status: DC | PRN
  Administered 2021-06-02: 500 mL

## 2021-06-02 MED ORDER — SODIUM CHLORIDE FLUSH 0.9 % IV SOLN
INTRAVENOUS | Status: AC
  Filled 2021-06-02: qty 20

## 2021-06-02 MED ORDER — SODIUM CHLORIDE 0.9 % IV SOLN
INTRAVENOUS | Status: DC | PRN
  Administered 2021-06-02: 30 mL

## 2021-06-02 MED ORDER — OXYCODONE HCL 5 MG/5ML PO SOLN
5.0000 mg | Freq: Once | ORAL | Status: DC | PRN
Start: 2021-06-02 — End: 2021-06-02

## 2021-06-02 MED ORDER — TRANEXAMIC ACID 1000 MG/10ML IV SOLN
INTRAVENOUS | Status: DC | PRN
  Administered 2021-06-02: 1000 mg via INTRAVENOUS

## 2021-06-02 MED ORDER — PROPOFOL 10 MG/ML IV BOLUS
INTRAVENOUS | Status: DC | PRN
Start: 2021-06-02 — End: 2021-06-02
  Administered 2021-06-02: 20 mg via INTRAVENOUS
  Administered 2021-06-02: 50 mg via INTRAVENOUS

## 2021-06-02 MED ORDER — METOCLOPRAMIDE HCL 5 MG/ML IJ SOLN: 5.0000 mg | Freq: Three times a day (TID) | INTRAMUSCULAR | Status: DC | PRN

## 2021-06-02 MED ORDER — HYDROMORPHONE HCL 1 MG/ML IJ SOLN: 0.2500 mg | INTRAMUSCULAR | Status: DC | PRN

## 2021-06-02 MED ORDER — FENTANYL CITRATE (PF) 100 MCG/2ML IJ SOLN
INTRAMUSCULAR | Status: DC | PRN
  Administered 2021-06-02: 25 ug via INTRAVENOUS
  Administered 2021-06-02: 100 ug via INTRAVENOUS

## 2021-06-02 MED ORDER — APIXABAN 2.5 MG PO TABS
2.5000 mg | ORAL_TABLET | Freq: Two times a day (BID) | ORAL | Status: DC
  Administered 2021-06-03: 2.5 mg via ORAL
  Filled 2021-06-02: qty 1

## 2021-06-02 MED ORDER — OXYCODONE HCL 5 MG PO TABS
5.0000 mg | ORAL_TABLET | ORAL | Status: DC | PRN
  Administered 2021-06-02: 5 mg via ORAL
  Filled 2021-06-02: qty 1

## 2021-06-02 MED ORDER — BISACODYL 10 MG RE SUPP: 10.0000 mg | Freq: Every day | RECTAL | Status: DC | PRN

## 2021-06-02 MED ORDER — LACTATED RINGERS IV SOLN: INTRAVENOUS | Status: DC | PRN

## 2021-06-02 MED ORDER — SUGAMMADEX SODIUM 200 MG/2ML IV SOLN
INTRAVENOUS | Status: DC | PRN
  Administered 2021-06-02: 200 mg via INTRAVENOUS

## 2021-06-02 MED ORDER — SACUBITRIL-VALSARTAN 24-26 MG PO TABS
1.0000 | ORAL_TABLET | Freq: Two times a day (BID) | ORAL | Status: DC
  Administered 2021-06-02 – 2021-06-03 (×2): 1 via ORAL
  Filled 2021-06-02 (×3): qty 1

## 2021-06-02 MED ORDER — METOCLOPRAMIDE HCL 10 MG PO TABS: 5.0000 mg | ORAL_TABLET | Freq: Three times a day (TID) | ORAL | Status: DC | PRN

## 2021-06-02 MED ORDER — LIDOCAINE HCL (CARDIAC) PF 100 MG/5ML IV SOSY
PREFILLED_SYRINGE | INTRAVENOUS | Status: DC | PRN
  Administered 2021-06-02: 80 mg via INTRAVENOUS

## 2021-06-02 MED ORDER — SODIUM CHLORIDE 0.9 % IV SOLN: INTRAVENOUS | Status: DC

## 2021-06-02 MED ORDER — KETOROLAC TROMETHAMINE 0.5 % OP SOLN
1.0000 [drp] | Freq: Three times a day (TID) | OPHTHALMIC | Status: DC
  Administered 2021-06-02 – 2021-06-03 (×3): 1 [drp] via OPHTHALMIC
  Filled 2021-06-02: qty 3

## 2021-06-02 MED ORDER — MAGNESIUM HYDROXIDE 400 MG/5ML PO SUSP: 30.0000 mL | Freq: Every day | ORAL | Status: DC | PRN

## 2021-06-02 MED ORDER — LORATADINE 10 MG PO TABS
10.0000 mg | ORAL_TABLET | Freq: Every day | ORAL | Status: DC
  Administered 2021-06-03: 10 mg via ORAL
  Filled 2021-06-02: qty 1

## 2021-06-02 MED ORDER — ONDANSETRON HCL 4 MG PO TABS: 4.0000 mg | ORAL_TABLET | Freq: Four times a day (QID) | ORAL | Status: DC | PRN

## 2021-06-02 MED ORDER — BUPIVACAINE-EPINEPHRINE (PF) 0.5% -1:200000 IJ SOLN
INTRAMUSCULAR | Status: DC | PRN
  Administered 2021-06-02: 30 mL via PERINEURAL

## 2021-06-02 MED ORDER — DEXAMETHASONE SODIUM PHOSPHATE 10 MG/ML IJ SOLN
INTRAMUSCULAR | Status: DC | PRN
  Administered 2021-06-02: 10 mg via INTRAVENOUS

## 2021-06-02 MED ORDER — GUAIFENESIN ER 600 MG PO TB12: 600.0000 mg | ORAL_TABLET | Freq: Two times a day (BID) | ORAL | Status: DC | PRN

## 2021-06-02 MED ORDER — TRANEXAMIC ACID 1000 MG/10ML IV SOLN
INTRAVENOUS | Status: AC
  Filled 2021-06-02: qty 10

## 2021-06-02 MED ORDER — ORAL CARE MOUTH RINSE: 15.0000 mL | Freq: Once | OROMUCOSAL | Status: AC

## 2021-06-02 MED ORDER — CHLORHEXIDINE GLUCONATE 0.12 % MT SOLN
OROMUCOSAL | Status: AC
  Administered 2021-06-02: 15 mL via OROMUCOSAL
  Filled 2021-06-02: qty 15

## 2021-06-02 MED ORDER — DIPHENHYDRAMINE HCL 12.5 MG/5ML PO ELIX: 12.5000 mg | ORAL_SOLUTION | ORAL | Status: DC | PRN

## 2021-06-02 MED ORDER — FENTANYL CITRATE (PF) 100 MCG/2ML IJ SOLN
INTRAMUSCULAR | Status: AC
  Administered 2021-06-02: 50 ug via INTRAVENOUS
  Filled 2021-06-02: qty 2

## 2021-06-02 MED ORDER — FENTANYL CITRATE (PF) 100 MCG/2ML IJ SOLN
25.0000 ug | INTRAMUSCULAR | Status: DC | PRN
  Administered 2021-06-02: 50 ug via INTRAVENOUS

## 2021-06-02 MED ORDER — CEFAZOLIN SODIUM-DEXTROSE 2-4 GM/100ML-% IV SOLN
INTRAVENOUS | Status: AC
  Filled 2021-06-02: qty 100

## 2021-06-02 MED ORDER — BUPIVACAINE-EPINEPHRINE (PF) 0.5% -1:200000 IJ SOLN
INTRAMUSCULAR | Status: AC
  Filled 2021-06-02: qty 30

## 2021-06-02 MED ORDER — FLEET ENEMA 7-19 GM/118ML RE ENEM: 1.0000 | ENEMA | Freq: Once | RECTAL | Status: DC | PRN

## 2021-06-02 MED ORDER — ROSUVASTATIN CALCIUM 5 MG PO TABS
5.0000 mg | ORAL_TABLET | Freq: Every day | ORAL | Status: DC
  Administered 2021-06-02: 5 mg via ORAL
  Filled 2021-06-02: qty 1

## 2021-06-02 SURGICAL SUPPLY — 74 items
BASEPLATE BOSS DRILL (MISCELLANEOUS) ×2 IMPLANT
BIT DRILL 2.5 (BIT) ×2
BIT DRILL 2.5X4.5XSCR (BIT) ×1 IMPLANT
BIT DRILL F/BASEPLATE CENTRAL (BIT) ×1 IMPLANT
BIT DRL 2.5X4.5XSCR (BIT) ×1
BLADE SAW SAG 25X90X1.19 (BLADE) IMPLANT
BNDG COHESIVE 4X5 TAN ST LF (GAUZE/BANDAGES/DRESSINGS) ×2 IMPLANT
CHLORAPREP W/TINT 26 (MISCELLANEOUS) ×4 IMPLANT
COOLER POLAR GLACIER W/PUMP (MISCELLANEOUS) ×2 IMPLANT
COVER BACK TABLE REUSABLE LG (DRAPES) ×2 IMPLANT
DRAPE 3/4 80X56 (DRAPES) ×4 IMPLANT
DRAPE IMP U-DRAPE 54X76 (DRAPES) ×4 IMPLANT
DRAPE INCISE IOBAN 66X45 STRL (DRAPES) ×4 IMPLANT
DRILL BASEPLATE CENTRAL  S (BIT) ×2
DRILL BASEPLATE CENTRAL S (BIT) ×1
DRSG OPSITE POSTOP 4X8 (GAUZE/BANDAGES/DRESSINGS) ×2 IMPLANT
ELECT BLADE 6.5 EXT (BLADE) IMPLANT
ELECT CAUTERY BLADE 6.4 (BLADE) ×2 IMPLANT
ELECT REM PT RETURN 9FT ADLT (ELECTROSURGICAL) ×2
ELECTRODE REM PT RTRN 9FT ADLT (ELECTROSURGICAL) ×1 IMPLANT
GAUZE XEROFORM 1X8 LF (GAUZE/BANDAGES/DRESSINGS) ×2 IMPLANT
GLENOSPHERE RSS 2 CONCENTRIC (Shoulder) ×2 IMPLANT
GLOVE SRG 8 PF TXTR STRL LF DI (GLOVE) ×2 IMPLANT
GLOVE SURG ENC MOIS LTX SZ7.5 (GLOVE) ×8 IMPLANT
GLOVE SURG ENC MOIS LTX SZ8 (GLOVE) ×8 IMPLANT
GLOVE SURG UNDER LTX SZ8 (GLOVE) ×2 IMPLANT
GLOVE SURG UNDER POLY LF SZ8 (GLOVE) ×4
GOWN STRL REUS W/ TWL LRG LVL3 (GOWN DISPOSABLE) ×2 IMPLANT
GOWN STRL REUS W/ TWL XL LVL3 (GOWN DISPOSABLE) ×1 IMPLANT
GOWN STRL REUS W/TWL LRG LVL3 (GOWN DISPOSABLE) ×4
GOWN STRL REUS W/TWL XL LVL3 (GOWN DISPOSABLE) ×2
GUIDE PIN 2.0 X 150 (WIRE) ×2 IMPLANT
HOOD W/PEELAWAY (MISCELLANEOUS) ×4 IMPLANT
ILLUMINATOR WAVEGUIDE N/F (MISCELLANEOUS) IMPLANT
IV NS IRRIG 3000ML ARTHROMATIC (IV SOLUTION) ×2 IMPLANT
KIT STABILIZATION SHOULDER (MISCELLANEOUS) ×2 IMPLANT
KIT TURNOVER KIT A (KITS) ×2 IMPLANT
LINER STD +0S RSS HXL (Liner) ×2 IMPLANT
MANIFOLD NEPTUNE II (INSTRUMENTS) ×2 IMPLANT
MASK FACE SPIDER DISP (MASK) ×2 IMPLANT
MAT ABSORB  FLUID 56X50 GRAY (MISCELLANEOUS) ×2
MAT ABSORB FLUID 56X50 GRAY (MISCELLANEOUS) ×1 IMPLANT
NDL SAFETY ECLIPSE 18X1.5 (NEEDLE) ×1 IMPLANT
NEEDLE HYPO 18GX1.5 SHARP (NEEDLE) ×2
NEEDLE HYPO 22GX1.5 SAFETY (NEEDLE) ×2 IMPLANT
NEEDLE SPNL 20GX3.5 QUINCKE YW (NEEDLE) ×2 IMPLANT
NS IRRIG 500ML POUR BTL (IV SOLUTION) ×2 IMPLANT
PACK ARTHROSCOPY SHOULDER (MISCELLANEOUS) ×2 IMPLANT
PAD ARMBOARD 7.5X6 YLW CONV (MISCELLANEOUS) ×2 IMPLANT
PAD WRAPON POLAR SHDR UNIV (MISCELLANEOUS) ×1 IMPLANT
PENCIL SMOKE EVACUATOR (MISCELLANEOUS) ×2 IMPLANT
PLATE BASE REVERSE RSS S (Plate) ×2 IMPLANT
PULSAVAC PLUS IRRIG FAN TIP (DISPOSABLE) ×2
SCREW 4.5X15 RSS W CAP (Screw) ×4 IMPLANT
SCREW 4.5X20 RSS W CAP (Screw) ×2 IMPLANT
SCREW 4.5X25 RSS W CAP (Screw) ×2 IMPLANT
SCREW 4.5X35 RSS W CAP (Screw) ×2 IMPLANT
SCREW BODY REVERSE SMALL TITAN (Screw) ×2 IMPLANT
SLING ULTRA II M (MISCELLANEOUS) ×2 IMPLANT
SPONGE T-LAP 18X18 ~~LOC~~+RFID (SPONGE) ×4 IMPLANT
STAPLER SKIN PROX 35W (STAPLE) ×2 IMPLANT
STEM PRESS FIT SZ13 TSS (Stem) ×2 IMPLANT
SUT ETHIBOND 0 MO6 C/R (SUTURE) ×2 IMPLANT
SUT FIBERWIRE #2 38 BLUE 1/2 (SUTURE)
SUT VIC AB 0 CT1 36 (SUTURE) ×2 IMPLANT
SUT VIC AB 2-0 CT1 27 (SUTURE) ×4
SUT VIC AB 2-0 CT1 TAPERPNT 27 (SUTURE) ×2 IMPLANT
SUTURE FIBERWR #2 38 BLUE 1/2 (SUTURE) IMPLANT
SYR 10ML LL (SYRINGE) ×2 IMPLANT
SYR 30ML LL (SYRINGE) ×2 IMPLANT
TIP FAN IRRIG PULSAVAC PLUS (DISPOSABLE) ×1 IMPLANT
TOWEL OR 17X26 4PK STRL BLUE (TOWEL DISPOSABLE) ×2 IMPLANT
WATER STERILE IRR 500ML POUR (IV SOLUTION) ×2 IMPLANT
WRAPON POLAR PAD SHDR UNIV (MISCELLANEOUS) ×2

## 2021-06-02 NOTE — Anesthesia Procedure Notes (Signed)
Procedure Name: Intubation Date/Time: 06/02/2021 7:44 AM Performed by: Kerri Perches, CRNA Pre-anesthesia Checklist: Patient being monitored, Suction available, Emergency Drugs available and Patient identified Patient Re-evaluated:Patient Re-evaluated prior to induction Oxygen Delivery Method: Circle system utilized Preoxygenation: Pre-oxygenation with 100% oxygen Induction Type: IV induction Ventilation: Mask ventilation without difficulty Laryngoscope Size: McGraph Grade View: Grade I Tube type: Oral Tube size: 7.0 mm Number of attempts: 1 Airway Equipment and Method: Stylet Placement Confirmation: ETT inserted through vocal cords under direct vision Secured at: 20 cm Tube secured with: Tape Dental Injury: Teeth and Oropharynx as per pre-operative assessment

## 2021-06-02 NOTE — Anesthesia Postprocedure Evaluation (Signed)
Anesthesia Post Note  Patient: Jenny Cooper  Procedure(s) Performed: REVERSE SHOULDER ARTHROPLASTY (Right: Shoulder)  Patient location during evaluation: PACU Anesthesia Type: General Level of consciousness: awake and alert, oriented and patient cooperative Pain management: pain level controlled Vital Signs Assessment: post-procedure vital signs reviewed and stable Respiratory status: spontaneous breathing, nonlabored ventilation and respiratory function stable Cardiovascular status: blood pressure returned to baseline and stable Postop Assessment: adequate PO intake Anesthetic complications: no   No notable events documented.   Last Vitals:  Vitals:   06/02/21 1045 06/02/21 1100  BP: 114/73 115/65  Pulse: 73 65  Resp: 17 18  Temp:    SpO2: 97% 91%    Last Pain:  Vitals:   06/02/21 1024  TempSrc:   PainSc: [place]

## 2021-06-02 NOTE — Anesthesia Preprocedure Evaluation (Signed)
Anesthesia Evaluation  Patient identified by MRN, date of birth, ID band Patient awake  General Assessment Comment:Patient is s/p fall two weeks ago, has a cast over her right forearm and wrist, and has bruising around her right cheek bone and eye  Reviewed: Allergy & Precautions, H&P , NPO status , Patient's Chart, lab work & pertinent test results, reviewed documented beta blocker date and time   History of Anesthesia Complications (+) history of anesthetic complications  Airway Mallampati: II  TM Distance: >3 FB Neck ROM: full    Dental  (+) Upper Dentures, Lower Dentures   Pulmonary shortness of breath and Long-Term Oxygen Therapy, pneumonia, COPD,  oxygen dependent, former smoker,  3L/min of O2 all the time   + rhonchi  + decreased breath sounds      Cardiovascular Exercise Tolerance: Poor hypertension, On Medications + angina with exertion + CAD, + Past MI, + CABG and +CHF  + dysrhythmias Atrial Fibrillation + pacemaker + Valvular Problems/Murmurs  Rhythm:Irregular Rate:Normal   Patient reported to have suffered MI x 2 and 2002 with subsequent PCI is being performed with each cardiac event.  Patient has stents (unknown type) and the proximal to mid RCA, mid to distal RCA, and mid LCx.  Patient underwent two-vessel CABG procedure in 2002 with SVG-LAD and SVG-OM bypass grafts being placed.  Additionally, patient with a history of significant mitral valve regurgitation and underwent a mitral valve repair/ring in 2002.   Patient underwent SVT ablation at [facility] on 07/27/2013.   TTE performed on 04/01/2015 revealed a moderately to severely reduced left ventricular systolic function with an EF 30-35%.  There was severe anterior and inferoposterior hypokinesis.  There was moderate mitral and mild to moderate tricuspid valve regurgitation.  Left atrium mildly dilated.  PASP severely elevated at a peak pressure of 60 mmHg.   Myocardial  perfusion imaging study performed on 04/17/2015 confirming the severely decreased left ventricular systolic function; EF <78%.  There was a large severe perfusion defect noted.  T wave inversions noted during stress in leads II, III, and V4.  Findings consistent with prior myocardial infarction with peri-infarct ischemia.  Study determined to be high risk.   Diagnostic left heart catheterization was performed on 04/28/2015 revealing severe three-vessel CAD; 30% stenosis of the proximal LAD-1, 100% stenosis of the proximal LAD-2, 60% stenosis of the ostial to proximal LCx, 99% stenosis of the mid LCx (previously stented), 100% stenosis of the ostial OM2 to OM2, 90% stenosis of the proximal to mid RCA (previously stented), and 100% stenosis of the mid to distal RCA (previously stented).  Additionally, SVG-LAD bypass graft patent, while the SVG-OM bypass graft noted to be 100% occluded.  No good options for revascularization noted.  Medical management of CAD and ischemic cardiomyopathy recommended.  ? Patient underwent cardiac ablation for atrial flutter on 05/10/2016 at [facility].   Repeat TTE performed on 04/06/2017 revealed severely reduced left ventricular systolic function with an EF of 30-35%.  There was severe anterior and anteroseptal hypokinesis.  There was trivial aortic and moderate mitral valve regurgitation.  Mitral valve mean gradient 4 mmHg.  Left atrium mildly dilated.  Moderately increased PASP with a peak pressure of 50 mmHg.  Patient underwent DCCV procedure on 04/08/2017 whereby a single 100 J shock converted patient from atrial fibrillation/flutter to a sinus ventricular paced rhythm.   Patient has undergone serial surveillance of her cardiac function via noninvasive studies.  Last TTE was performed on 03/06/2020 that demonstrated a low normal left  ventricular systolic function with an EF of 50-55%.  There were no regional wall motion abnormalities.  PASP remained moderately elevated at  49.9 mmHg.  There was mild aortic and mitral valve regurgitation noted.    Neuro/Psych  Neuromuscular disease negative psych ROS   GI/Hepatic Neg liver ROS, GERD  ,  Endo/Other  diabetes, Well Controlled, Type 2Hypothyroidism   Renal/GU negative Renal ROS  negative genitourinary   Musculoskeletal  (+) Arthritis ,   Abdominal   Peds  Hematology negative hematology ROS (+)   Anesthesia Other Findings Past Medical History: 12/11/2017: Acute respiratory failure with hypoxia (HCC) No date: Atypical atrial flutter ([place])     Comment:  a. s/p ablation 07/27/2013 followed by Dr. Rockey Situ No date: Balance problem No date: CAD (coronary artery disease)     Comment:  a. s/p MI x 2 in 2002 s/p PCI x 2 in 2002; b. s/p 2v               CABG 2002; c. stress echo 07/2004 w/ evi of pos & inf               infarct & no evi of ischemia; d. 4/08 dipyridamole scan               w/ multiple areas of infarct, no ischemia, EF 49%; e.               cath 04/28/15 3v CAD, med Rx rec, no targets for revasc,               LM lum irregs, pLAD 30%, 100%, ost-pLCx 60%, [name] 99%,               OM2 100%, p-mRCA 90%, [name] 100% L-R collats, VG-mLAD               irregs, VG-OM2 oc 01/03/2015: Carcinoma of right lung (HCC)     Comment:  a. followed by Dr. Oliva Bustard No date: Chronic systolic CHF (congestive heart failure) ([place])     Comment:  a. echo 03/2015: EF 30-35%, sev ant/inf/pos HK, in mild               to mod MR No date: Complication of anesthesia     Comment:  more recently patient oxygen levels do not rebound as               quickly 08/06/2016: Compressed spine fracture [facility])     Comment:  lumbar 2, t11, t12 No date: COPD (chronic obstructive pulmonary disease) ([place]) 05/26/2016: Fractured pelvis ([place])     Comment:  2 places No date: GERD (gastroesophageal reflux disease) No date: History of blood clots     Comment:  12/2001 left leg 2013: History of colonoscopy 2015: History of mammography,  screening 2013: History of Papanicolaou smear of cervix No date: HLD (hyperlipidemia) No date: HTN (hypertension) No date: Hypothyroidism No date: Lung cancer ([place]) No date: Mitral regurgitation     Comment:  a. s/p mitral ring placement 09/2000; b. echo 09/2010: EF               50%, inf HK, post AK, mild MR, prosthetic mitral valve               ring w/ peak gradient of 10 mmHg; b. echo 2/13: EF 50%,               mild MR/TR    No date: Myocardial infarction ([place])  Comment:  X 2 (LAST ONE IN 2002) No date: Neuropathy No date: Neuropathy No date: Pacemaker     Comment:  a. MDT 2002; b. generator replacement 2013; c. followed               by Dr. Omelia Blackwater, MD No date: PAF (paroxysmal atrial fibrillation) ([place])     Comment:  a. on Eliquis  No date: Personal history of chemotherapy No date: Personal history of radiation therapy No date: Pneumonia Past Surgical History: 04/2016: ABLATION     Comment:  [facility] No date: APPENDECTOMY 04/28/2015: CARDIAC CATHETERIZATION; N/A     Comment:  Procedure: Left Heart Cath and Coronary Angiography;                Surgeon: Wellington Hampshire, MD;  Location: [place]               CV LAB;  Service: Cardiovascular;  Laterality: N/A; 04/08/2017: CARDIOVERSION; N/A     Comment:  Procedure: CARDIOVERSION;  Surgeon: Wellington Hampshire,               MD;  Location: [facility] ORS;  Service: Cardiovascular;                Laterality: N/A; 07/11/2018: CHEST TUBE INSERTION; N/A     Comment:  Procedure: PLEURX CATH INSERTION;  Surgeon: [name], MD;  Location: [facility] ORS;  Service: Thoracic;                Laterality: N/A; 12/2009: COLONOSCOPY     Comment:  2 small tubular adenomas 09/2000: CORONARY ARTERY BYPASS GRAFT No date: ECTOPIC PREGNANCY SURGERY 10/06/2015: ELECTROMAGNETIC NAVIGATION BROCHOSCOPY; N/A     Comment:  Procedure: ELECTROMAGNETIC NAVIGATION BRONCHOSCOPY;                Surgeon: Flora Lipps, MD;  Location: [facility] ORS;  Service:                Cardiopulmonary;  Laterality: N/A; 10/06/2015: ENDOBRONCHIAL ULTRASOUND; N/A     Comment:  Procedure: ENDOBRONCHIAL ULTRASOUND;  Surgeon: Flora Lipps, MD;  Location: [facility] ORS;  Service: Cardiopulmonary;              Laterality: N/A; 07/11/2018: FLEXIBLE BRONCHOSCOPY; Right     Comment:  Procedure: FLEXIBLE BRONCHOSCOPY;  Surgeon: [name], MD;  Location: [facility] ORS;  Service: Thoracic;                Laterality: Right; 09/28/2016: KYPHOPLASTY; N/A     Comment:  Procedure: KYPHOPLASTY;  Surgeon: Hessie Knows, MD;                Location: [facility] ORS;  Service: Orthopedics;  Laterality:               N/A; 02/20/2018: KYPHOPLASTY; N/A     Comment:  Procedure: [id number];  Surgeon: Hessie Knows, MD;               Location: [facility] ORS;  Service: Orthopedics;  Laterality:               N/A; 03/2012: PACEMAKER INSERTION 12/24/2016: thorocentesis No date: VAGINAL HYSTERECTOMY     Comment:  partial - left ovary remains 07/11/2018: VIDEO ASSISTED THORACOSCOPY; Right  Comment:  Procedure: VIDEO ASSISTED THORACOSCOPY;  Surgeon: [name], MD;  Location: [facility] ORS;  Service: Thoracic;                Laterality: Right; 07/11/2018: VIDEO ASSISTED THORACOSCOPY (VATS) W/TALC PLEUADESIS;  Right     Comment:  Procedure: THORACOTOMY PLEURAL BIOPSY WITH TALC               PLEURODESIS;  Surgeon: [name], MD;  Location: [facility]              ORS;  Service: Thoracic;  Laterality: Right;  pleural               biopsy BMI    Body Mass Index:  23.89 kg/m     Reproductive/Obstetrics negative OB ROS                             Anesthesia Physical  Anesthesia Plan  ASA: 4  Anesthesia Plan: General   Post-op Pain Management:    Induction: Intravenous  PONV Risk Score and Plan: 4 or greater and Ondansetron, Dexamethasone and Treatment may vary due to age or medical condition  Airway Management Planned: Oral  ETT  Additional Equipment: None  Intra-op Plan:   Post-operative Plan: Extubation in OR  Informed Consent: I have reviewed the patients History and Physical, chart, labs and discussed the procedure including the risks, benefits and alternatives for the proposed anesthesia with the patient or authorized representative who has indicated his/her understanding and acceptance.     Dental Advisory Given  Plan Discussed with: CRNA  Anesthesia Plan Comments: (Discussed risks of anesthesia with patient, including PONV, sore throat, lip/dental damage. Rare risks discussed as well, such as cardiorespiratory and neurological sequelae, and allergic reactions. Patient counseled on being higher risk for anesthesia due to comorbidities: O2-dependent lung disease, CHF. Patient was told about increased risk of cardiac and respiratory events, including death. Patient understands.  Patient's poor respiratory status precludes placement of interscalene block for post op pain control. Will place magnet over pacemaker; patient says she believes she is partially dependent on it.)        Anesthesia Quick Evaluation

## 2021-06-02 NOTE — H&P (Signed)
History of Present Illness: Jenny Cooper is a 85 y.o. female who presents today for evaluation of right proximal humerus fracture, date of injury 05/19/2021. Patient fell out of her bed, landing on her right shoulder. She was evaluated in the emergency department where she was found to have a comminuted displaced proximal humeral fracture. Patient is on Eliquis for A. fib. Has a history of COPD and CHF. She typically walks without any assistive devices and lives at home and performs all ADLs. She is right-hand dominant. Patient's pain has been controlled with oxycodone at night. She denies any numbness or tingling the right upper extremity. She has had a lot of swelling and bruising along the right upper extremity.  Past Medical History:  Allergic state   Anesthesia complication (APNEA in recovery)   AR (allergic rhinitis)   Arrhythmia   Atrial fibrillation (CMS-HCC)   CAD (coronary artery disease)   Chronic systolic CHF (congestive heart failure) (CMS-HCC) 05/10/2016   COPD (chronic obstructive pulmonary disease) (CMS-HCC)   DVT (deep venous thrombosis) (CMS-HCC)   Fitting and adjustment of cardiac pacemaker   Heart disease   Hyperlipidemia   Hypertension   Osteoarthritis   Osteoporosis   Squamous cell lung cancer, right (CMS-HCC) 10/04/2014   HYPOTHYROIDISM   Type 2 diabetes mellitus with hemoglobin A1c goal of less than 7.0% (CMS-HCC) 11/17/2015   Past Surgical History:  APPENDECTOMY 1957   CARDIAC PACEMAKER PLACEMENT   CARDIAC VALVE REPLACEMENT   CORONARY ANGIOPLASTY   CORONARY ARTERY BYPASS GRAFT X2-BYPASS SURGERY 2002   ENDOSCOPIC CARPAL TUNNEL RELEASE Right   etopic pregnancy surgery   EXTRACTION CATARACT EXTRACAPSULAR W/INSERTION INTRAOCULAR PROSTHESIS Left 05/18/2021  Procedure: EXTRACAPSULAR CATARACT PHACO REMOVAL WITH INSERTION OF INTRAOCULAR LENS PROSTHESIS (1 STAGE PROCEDURE), MANUAL OR MECHANICAL TECHNIQUE; WITHOUT ENDOSCOPIC CYCLOPHOTOCOAGULATION; Surgeon: Ross Ludwig, MD; Location: [facility]; Service: Ophthalmology; Laterality: Left;   HYSTERECTOMY 1980 (HAS OVARIES)   INSERT / REPLACE / REMOVE PACEMAKER 2002   INSJ RX ELUTING IMPLT PUNCTAL DILAT LAC CANAL EA Left 05/18/2021  Procedure: DEXTENZA----Insertion of drug-eluting implant, including punctal dilation when performed, into lacrimal canaliculus, each; Surgeon: Ross Ludwig, MD; Location[facility]; Service: Ophthalmology; Laterality: Left;   Kyphoplasty L4 02/20/2018 (Dr. Rudene Christians)   Kyphoplasty T11, T12, L2 09/28/2016 (Dr. Rudene Christians)   REPAIR MITRAL VALVE N/A   thorocentesis   veinstripping Left   Past Family History:  High blood pressure (Hypertension) Mother   Osteoporosis (Thinning of bones) Mother   Lung disease Mother   Lung disease Father   COPD Sister   Colon cancer Brother   COPD Brother   Stroke Maternal Grandmother   Anesthesia problems Neg Hx   Malignant hyperthermia Neg Hx   Blindness Neg Hx   Cataracts Neg Hx   Glaucoma Neg Hx   Macular degeneration Neg Hx   Medications:  acetaminophen (TYLENOL) 500 MG tablet Take 500 mg by mouth as needed.   apixaban (ELIQUIS) 2.5 mg tablet Take 2.5 mg by mouth every 12 (twelve) hours   budesonide (PULMICORT) 0.5 mg/2 mL nebulizer solution Take 0.5 mg by nebulization 2 (two) times daily   cetirizine (ZYRTEC) 10 MG tablet Take 10 mg by mouth every evening   cyanocobalamin 2000 MCG tablet Take 2,000 mcg by mouth once daily   fluticasone propionate (FLONASE) 50 mcg/actuation nasal spray Place 1 spray into both nostrils once daily as needed   FUROsemide (LASIX) 20 MG tablet Take 1-2 tablets daily as needed for increased ankle swelling or increased  weight. (Patient taking differently: Take 20 mg by mouth once daily) 60 tablet 3   guaiFENesin (MUCINEX) 600 mg SR tablet Take 600 mg by mouth 2 (two) times daily as needed   ipratropium-albuteroL (DUO-NEB) nebulizer solution Take 3 mLs by nebulization as needed   ketorolac  (ACULAR) 0.5 % ophthalmic solution Place 1 drop into the left eye 3 (three) times daily for 28 days Starting after surgery 5 mL 1   levothyroxine (SYNTHROID) 88 MCG tablet Take 1 tablet (88 mcg total) by mouth once daily 90 tablet 4   metoprolol succinate (TOPROL-XL) 25 MG XL tablet Take 2 tablets (50 mg total) by mouth 2 (two) times daily 360 tablet 3   multivitamin tablet Take 1 tablet by mouth once daily   OXYGEN-AIR DELIVERY SYSTEMS MISC Inhale 3 L into the lungs continuously   revefenacin (YUPELRI) 175 mcg/3 mL nebulizer solution Take 175 mcg by nebulization once daily   rosuvastatin (CRESTOR) 5 MG tablet Take 5 mg by mouth every evening   sacubitriL-valsartan (ENTRESTO) 24-26 mg tablet Take 1 tablet by mouth every 12 (twelve) hours.   Allergies:  Cefdinir Rash (Had hives after completing treatment - unsure if it was the cause)   Enoxaparin Sodium Itching   Meperidine Other (pt does not like how it makes her feel)  Review of Systems:  A comprehensive 14 point ROS was performed, reviewed by me today, and the pertinent orthopaedic findings are documented in the HPI.  Physical Exam: BP 122/84  General:  Well developed, well nourished, no apparent distress, normal affect, presents in a wheelchair with daughter.  HEENT: Head normocephalic, atraumatic, PERRL.   Abdomen: Soft, non tender, non distended, Bowel sounds present.  Heart: Examination of the heart reveals regular rate. There is no murmur noted on ascultation. There is a normal apical pulse.  Lungs: Lungs are clear to auscultation. There is no wheeze, rhonchi, or crackles. There is normal expansion of bilateral chest walls.   Right upper extremity: Examination of the right upper extremity shows normal sensation. She has full composite fist. No limitation in digit range of motion. She is in a well fitted posterior splint and sling. She does have some swelling and bruising to the proximal humerus as well as axillary area that  does appear to be improving and healing. There is no warmth or redness. She is minimally tender to palpation.  AP and Y view of the right shoulder ordered interpreted by me in the office today. Impression: Patient has a proximal humerus fracture along the surgical neck with impaction. There is comminution of the head with displacement. No evidence of dislocation but does appear to be some subluxation inferiorly.  CT Imaging Right Shoulder: 1. Acute comminuted, impacted fracture of the humeral head and neck with involvement of the greater and lesser tuberosities. 2. Inferior humeral head subluxation. 3. Subacromial/subdeltoid lipohemobursitis.  Impression: Comminuted closed displaced fracture of proximal end of right humerus.  Plan:  68. 85 year old female with right proximal humerus fracture with comminution and displacement. She lives at home and performs ADLs independently. She is right-hand dominant. Discussed operative and nonoperative treatment options. Risks, complications, benefits of a right shoulder reverse total arthroplasty has been discussed with the patient. Patient has agreed and consented to the procedure with Dr. Roland Rack.   H&P reviewed and patient re-examined. No changes.

## 2021-06-02 NOTE — Evaluation (Signed)
Physical Therapy Evaluation Patient Details Name: Jenny Cooper MRN: [id number] DOB: 11-Oct-1935 Today's Date: 06/02/2021  History of Present Illness  85 y/o female who fell out of bed suffering humeral fracture 9/22, now s/p R total shoulder 10/4.  Pt is R handed.  Clinical Impression  Pt did well with PT exam and was able to show functional strength with mobility and ~60 ft of ambulation.   She typically does not use an AD but for the last 2 weeks has had a family give HHA for any ambulation.  She did well with QC today, will trial SPC as well as steps tomorrow before planned d/c.        Recommendations for follow up therapy are one component of a multi-disciplinary discharge planning process, led by the attending physician.  Recommendations may be updated based on patient status, additional functional criteria and insurance authorization.  Follow Up Recommendations Follow surgeon's recommendation for DC plan and follow-up therapies;Supervision - Intermittent    Equipment Recommendations  Cane    Recommendations for Other Services Rehab consult     Precautions / Restrictions Precautions Precautions: Shoulder Type of Shoulder Precautions: total Shoulder Interventions: Shoulder sling/immobilizer Precaution Booklet Issued: Yes (comment) Restrictions Weight Bearing Restrictions: Yes RUE Weight Bearing: Non weight bearing      Mobility  Bed Mobility Overal bed mobility: Needs Assistance Bed Mobility: Supine to Sit     Supine to sit: Min assist     General bed mobility comments: Pt made effort to get to sitting w/o assist, ultimately needed HHA to pull to sitting and light assist to get to EOB    Transfers Overall transfer level: Modified independent Equipment used: Quad cane             General transfer comment: cues to insure approrpaite UE use, able to rise w/o physical assist  Ambulation/Gait Ambulation/Gait assistance: Min assist Gait Distance (Feet): 60  Feet Assistive device: Quad cane       General Gait Details: Pt able to ambulate into the hallway with slow but steady effort.  She did fatigue and even on 3L her O2 dropped to the low 80s.  Further ambulation deferred this date  Stairs            Wheelchair Mobility    Modified Rankin (Stroke Patients Only)       Balance Overall balance assessment: Modified Independent                                           Pertinent Vitals/Pain Pain Assessment: No/denies pain (chronic minimal distal UE neuropathy)    Home Living Family/patient expects to be discharged to:: Private residence Living Arrangements: Other relatives Available Help at Discharge: Family (lives with son, daughter will provide from 24/7 assist while he is at work)   Home Access: Stairs to enter Entrance Stairs-Rails: Chemical engineer of Steps: 3 Home Layout: Able to live on main level with bedroom/bathroom Home Equipment: Environmental consultant - 4 wheels;Grab bars - toilet      Prior Function Level of Independence: Independent with assistive device(s)         Comments: Apparently family has been providing HHA for the last 2 weeks (since fall) but prior to that she managed to get around in the home w/o assist or AD     Hand Dominance        Extremity/Trunk Assessment  Upper Extremity Assessment Upper Extremity Assessment: Generalized weakness (R in sling)    Lower Extremity Assessment Lower Extremity Assessment: Generalized weakness;Overall WFL for tasks assessed       Communication   Communication: No difficulties  Cognition Arousal/Alertness: Awake/alert Behavior During Therapy: WFL for tasks assessed/performed Overall Cognitive Status: Within Functional Limits for tasks assessed                                        General Comments      Exercises     Assessment/Plan    PT Assessment Patient needs continued PT services  PT Problem  List Decreased strength;Decreased range of motion;Decreased activity tolerance;Decreased mobility;Decreased balance;Decreased coordination;Decreased knowledge of use of DME;Decreased safety awareness;Pain;Cardiopulmonary status limiting activity;Decreased knowledge of precautions       PT Treatment Interventions Gait training;DME instruction;Stair training;Functional mobility training;Therapeutic activities;Therapeutic exercise;Balance training;Neuromuscular re-education;Patient/family education    PT Goals (Current goals can be found in the Care Plan section)  Acute Rehab PT Goals Patient Stated Goal: go home PT Goal Formulation: With patient Time For Goal Achievement: 06/16/21 Potential to Achieve Goals: Good    Frequency BID   Barriers to discharge        Co-evaluation               AM-PAC PT "6 Clicks" Mobility  Outcome Measure Help needed turning from your back to your side while in a flat bed without using bedrails?: A Little Help needed moving from lying on your back to sitting on the side of a flat bed without using bedrails?: A Little Help needed moving to and from a bed to a chair (including a wheelchair)?: A Little Help needed standing up from a chair using your arms (e.g., wheelchair or bedside chair)?: A Little Help needed to walk in hospital room?: A Little Help needed climbing 3-5 steps with a railing? : A Lot 6 Click Score: 17    End of Session Equipment Utilized During Treatment: Gait belt;Oxygen (3L (uses 3L at baseline)) Activity Tolerance: Patient limited by fatigue Patient left: with chair alarm set;with call bell/phone within reach;with family/visitor present Nurse Communication: Mobility status PT Visit Diagnosis: Muscle weakness (generalized) (M62.81);Difficulty in walking, not elsewhere classified (R26.2);Pain Pain - Right/Left: Right Pain - part of body: Shoulder    Time: 5462-7035 PT Time Calculation (min) (ACUTE ONLY): 38 min   Charges:    PT Evaluation $PT Eval Low Complexity: 1 Low PT Treatments $Gait Training: 8-22 mins $Therapeutic Activity: 8-22 mins        Kreg Shropshire, DPT 06/02/2021, 6:09 PM

## 2021-06-02 NOTE — Plan of Care (Signed)
  Problem: Education: Goal: Knowledge of General Education information will improve Description: Including pain rating scale, medication(s)/side effects and non-pharmacologic comfort measures 06/02/2021 1620 by Daymon Larsen, RN Outcome: Progressing 06/02/2021 1619 by Daymon Larsen, RN Outcome: Progressing   Problem: Health Behavior/Discharge Planning: Goal: Ability to manage health-related needs will improve 06/02/2021 1620 by Daymon Larsen, RN Outcome: Progressing 06/02/2021 1619 by Daymon Larsen, RN Outcome: Progressing   Problem: Clinical Measurements: Goal: Ability to maintain clinical measurements within normal limits will improve 06/02/2021 1620 by Daymon Larsen, RN Outcome: Progressing 06/02/2021 1619 by Daymon Larsen, RN Outcome: Progressing Goal: Will remain free from infection 06/02/2021 1620 by Daymon Larsen, RN Outcome: Progressing 06/02/2021 1619 by Daymon Larsen, RN Outcome: Progressing Goal: Diagnostic test results will improve 06/02/2021 1620 by Daymon Larsen, RN Outcome: Progressing 06/02/2021 1619 by Daymon Larsen, RN Outcome: Progressing Goal: Respiratory complications will improve 06/02/2021 1620 by Daymon Larsen, RN Outcome: Progressing 06/02/2021 1619 by Daymon Larsen, RN Outcome: Progressing Goal: Cardiovascular complication will be avoided 06/02/2021 1620 by Daymon Larsen, RN Outcome: Progressing 06/02/2021 1619 by Daymon Larsen, RN Outcome: Progressing   Problem: Activity: Goal: Risk for activity intolerance will decrease 06/02/2021 1620 by Daymon Larsen, RN Outcome: Progressing 06/02/2021 1619 by Daymon Larsen, RN Outcome: Progressing   Problem: Nutrition: Goal: Adequate nutrition will be maintained 06/02/2021 1620 by Daymon Larsen, RN Outcome: Progressing 06/02/2021 1619 by Daymon Larsen, RN Outcome: Progressing   Problem: Coping: Goal: Level  of anxiety will decrease 06/02/2021 1620 by Daymon Larsen, RN Outcome: Progressing 06/02/2021 1619 by Daymon Larsen, RN Outcome: Progressing   Problem: Elimination: Goal: Will not experience complications related to bowel motility 06/02/2021 1620 by Daymon Larsen, RN Outcome: Progressing 06/02/2021 1619 by Daymon Larsen, RN Outcome: Progressing Goal: Will not experience complications related to urinary retention 06/02/2021 1620 by Daymon Larsen, RN Outcome: Progressing 06/02/2021 1619 by Daymon Larsen, RN Outcome: Progressing   Problem: Pain Managment: Goal: General experience of comfort will improve 06/02/2021 1620 by Daymon Larsen, RN Outcome: Progressing 06/02/2021 1619 by Daymon Larsen, RN Outcome: Progressing   Problem: Safety: Goal: Ability to remain free from injury will improve 06/02/2021 1620 by Daymon Larsen, RN Outcome: Progressing 06/02/2021 1619 by Daymon Larsen, RN Outcome: Progressing   Problem: Skin Integrity: Goal: Risk for impaired skin integrity will decrease 06/02/2021 1620 by Daymon Larsen, RN Outcome: Progressing 06/02/2021 1619 by Daymon Larsen, RN Outcome: Progressing   Problem: Education: Goal: Knowledge of the prescribed therapeutic regimen will improve Outcome: Progressing Goal: Understanding of activity limitations/precautions following surgery will improve Outcome: Progressing Goal: Individualized Educational Video(s) Outcome: Progressing   Problem: Activity: Goal: Ability to tolerate increased activity will improve Outcome: Progressing   Problem: Pain Management: Goal: Pain level will decrease with appropriate interventions Outcome: Progressing

## 2021-06-02 NOTE — Transfer of Care (Signed)
Immediate Anesthesia Transfer of Care Note  Patient: Jenny Cooper  Procedure(s) Performed: REVERSE SHOULDER ARTHROPLASTY (Right: Shoulder)  Patient Location: PACU  Anesthesia Type:General  Level of Consciousness: awake  Airway & Oxygen Therapy: Patient Spontanous Breathing  Post-op Assessment: Report given to RN  Post vital signs: stable  Last Vitals:  Vitals Value Taken Time  BP 113/57 06/02/21 1024  Temp    Pulse 80 06/02/21 1026  Resp 14 06/02/21 1026  SpO2 93 % 06/02/21 1026  Vitals shown include unvalidated device data.  Last Pain:  Vitals:   06/02/21 0633  TempSrc: Temporal         Complications: No notable events documented.

## 2021-06-02 NOTE — Op Note (Signed)
06/02/2021  10:07 AM  Patient:   Jenny Cooper  Pre-Op Diagnosis:   Comminuted intra-articular proximal humerus fracture, right shoulder.  Post-Op Diagnosis:   Same.  Procedure:   Reverse right total shoulder arthroplasty with biceps tenodesis.  Surgeon:   Pascal Lux, MD  Assistant:   Cameron Proud, PA-C; Rennis Chris, PA-S  Anesthesia:   GET  Findings:   As above.  Complications:   None  EBL:   100 cc  Fluids:   1100 cc crystalloid  UOP:   None  TT:   None  Drains:   None  Closure:   Staples  Implants:   All press-fit Integra system with a 13 mm stem, a small metaphyseal body, a +0 mm humeral platform, a mini baseplate, and a 38 mm concentric +2 mm laterally offset glenosphere.  Brief Clinical Note:   The patient is an 85 year old female who sustained the above-noted injury last week. A subsequent CT scan demonstrated intra-articular extension of the fracture, compromising the articular surface of the proximal humerus. The patient presents at this time for a reverse right total shoulder arthroplasty.  Procedure:   The patient was brought into the operating room and lain in the supine position. The patient then underwent general endotracheal intubation and anesthesia before the patient was repositioned in the beach chair position using the beach chair positioner. The right shoulder and upper extremity were prepped with ChloraPrep solution before being draped sterilely. Preoperative antibiotics were administered.   A timeout was performed to verify the appropriate surgical site before a standard anterior approach to the shoulder was made through an approximately 4-5 inch incision. The incision was carried down through the subcutaneous tissues to expose the deltopectoral fascia. The interval between the deltoid and pectoralis muscles was identified and this plane developed, retracting the cephalic vein laterally with the deltoid muscle. The conjoined tendon was identified.  Its lateral margin was dissected and the Kolbel self-retraining retractor inserted.  The biceps tendon was identified and tenodesed to the adjacent pectoralis major tendon using several #0 Ethibond interrupted sutures.    The biceps tendon was followed up through the bicipital groove, releasing the rotator interval in the process. Using an osteotome, the lesser tuberosity was osteotomized and retracted anteriorly, bringing the subscapularis tendon with it. More posteriorly, the osteotome was used to osteotomize the greater tuberosity from the remaining portions of the humeral head. The humeral head fragments were removed piecemeal, including the far posterior fragments to permit visualization of the glenoid.  Attention was redirected to the glenoid. The labrum was debrided circumferentially before the center of the glenoid was identified. The guidewire was drilled into the glenoid neck using the appropriate guide. After verifying its position, it was overreamed with the mini-baseplate reamer to create a flat surface before the stem reamer was utilized. The superior and inferior peg sites were reamed using the appropriate guide to complete the glenoid preparation. The permanent mini-baseplate was impacted into place. It was stabilized with a 25 x 4.5 mm central screw and four peripheral screws. Locking caps were placed over the superior and inferior screws. The permanent 38 mm concentric glenosphere with +2 mm of lateral offset was then impacted into place and its Morse taper locking mechanism verified using manual distraction.  Attention was directed to the humeral side. The humeral canal was prepared utilizing the tapered stem reamers sequentially beginning with the 7 mm stem and progressing to a 13 mm stem. This demonstrated a good tight fit. The metaphyseal  region was then prepared using the appropriate planar device. The trial stem and small metaphyseal body were put together on the back table and a trial  reduction performed using the +0 mm and +3 mm inserts. With the +0 mm insert, the arm demonstrated excellent range of motion as the hand could be brought across the chest to the opposite shoulder and brought to the top of the patient's head and to the patient's ear. The shoulder appeared stable throughout this range of motion. The joint was dislocated and the trial components removed. The permanent 13 mm stem with the small body was impacted into place with care taken to maintain the appropriate version. A repeat trial reduction with the +0 mm insert again demonstrated excellent stability with the findings as described above. Therefore, the shoulder was re-dislocated and, after inserting the locking screw to secure the body to the stem, the permanent +0 mm insert impacted into place. After verifying its locking mechanism, the shoulder was relocated using two finger pressure and again placed through a range of motion with the findings as described above.  The wound was copiously irrigated with sterile saline solution using the jet lavage system before a total of 20 cc of Exparel diluted out to 60 cc with normal saline and 30 cc of 0.5% Sensorcaine with epinephrine was injected into the pericapsular and peri-incisional tissues to help with postoperative analgesia. The subscapularis tendon was reapproximated using #2 FiberWire interrupted sutures. The deltopectoral interval was closed using #0 Vicryl interrupted sutures before the subcutaneous tissues were closed using 2-0 Vicryl interrupted sutures. The skin was closed using staples. Prior to closing the skin, 1 g of transexemic acid in 10 cc of normal saline was injected intra-articularly to help with postoperative bleeding. A sterile occlusive dressing was applied to the wound before the arm was placed into a shoulder immobilizer with an abduction pillow. A Polar Care system also was applied to the shoulder. The patient was then transferred back to a hospital bed  before being awakened, extubated, and returned to the recovery room in satisfactory condition after tolerating the procedure well.

## 2021-06-03 ENCOUNTER — Ambulatory Visit: Payer: Medicare Other

## 2021-06-03 ENCOUNTER — Encounter: Payer: Self-pay | Admitting: Surgery

## 2021-06-03 DIAGNOSIS — S42291A Other displaced fracture of upper end of right humerus, initial encounter for closed fracture: Secondary | ICD-10-CM | POA: Diagnosis not present

## 2021-06-03 LAB — BASIC METABOLIC PANEL
Anion gap: 7 (ref 5–15)
BUN: 13 mg/dL (ref 8–23)
CO2: 26 mmol/L (ref 22–32)
Calcium: 8 mg/dL — ABNORMAL LOW (ref 8.9–10.3)
Chloride: 104 mmol/L (ref 98–111)
Creatinine, Ser: 0.87 mg/dL (ref 0.44–1.00)
GFR, Estimated: >60 mL/min (ref >60)
Glucose, Bld: 137 mg/dL — ABNORMAL HIGH (ref 70–99)
Potassium: 4 mmol/L (ref 3.5–5.1)
Sodium: 137 mmol/L (ref 135–145)

## 2021-06-03 LAB — CBC
HCT: 31.4 % — ABNORMAL LOW (ref 36.0–46.0)
Hemoglobin: 10.3 g/dL — ABNORMAL LOW (ref 12.0–15.0)
MCH: 31.4 pg (ref 26.0–34.0)
MCHC: 32.8 g/dL (ref 30.0–36.0)
MCV: 95.7 fL (ref 80.0–100.0)
Platelets: 293 10*3/uL (ref 150–400)
RBC: 3.28 MIL/uL — ABNORMAL LOW (ref 3.87–5.11)
RDW: 14.1 % (ref 11.5–15.5)
WBC: 8 10*3/uL (ref 4.0–10.5)
nRBC: 0 % (ref 0.0–0.2)

## 2021-06-03 MED ORDER — OXYCODONE HCL 5 MG PO TABS: 5.0000 mg | ORAL_TABLET | ORAL | 0 refills | Status: DC | PRN

## 2021-06-03 MED ORDER — ONDANSETRON HCL 4 MG PO TABS: 4.0000 mg | ORAL_TABLET | Freq: Four times a day (QID) | ORAL | 0 refills | Status: DC | PRN

## 2021-06-03 NOTE — Discharge Instructions (Signed)
Diet: As you were doing prior to hospitalization  ? ?Shower:  May shower but keep the wounds dry, use an occlusive plastic wrap, NO SOAKING IN TUB.  If the bandage gets wet, change with a clean dry gauze. ? ?Dressing:  You may change your dressing as needed. Change the dressing with sterile gauze dressing.   ? ?Activity:  Increase activity slowly as tolerated, but follow the weight bearing instructions below.  No lifting or driving for 6 weeks. ? ?Weight Bearing:   Non-weightbearing to the right arm. ? ?To prevent constipation: you may use a stool softener such as - ? ?Colace (over the counter) 100 mg by mouth twice a day  ?Drink plenty of fluids (prune juice may be helpful) and high fiber foods ?Miralax (over the counter) for constipation as needed.   ? ?Itching:  If you experience itching with your medications, try taking only a single pain pill, or even half a pain pill at a time.  You may take up to 10 pain pills per day, and you can also use benadryl over the counter for itching or also to help with sleep.  ? ?Precautions:  If you experience chest pain or shortness of breath - call 911 immediately for transfer to the hospital emergency department!! ? ?If you develop a fever greater that 101 F, purulent drainage from wound, increased redness or drainage from wound, or calf pain-Call [facility]                                              ?Follow- Up Appointment:  Please call for an appointment to be seen in 2 weeks at [facility]  ?

## 2021-06-03 NOTE — Progress Notes (Signed)
Subjective: 1 Day Post-Op Procedure(s) (LRB): REVERSE SHOULDER ARTHROPLASTY (Right) Patient reports pain as mild.   Patient is well, and has had no acute complaints or problems Plan is to go Home after hospital stay. Negative for chest pain and shortness of breath Fever: no Gastrointestinal:Negative for nausea and vomiting Patient is passing gas without pain this AM.  Objective: Vital signs in last 24 hours: Temp:  [97 F (36.1 C)-97.9 F (36.6 C)] 97.9 F (36.6 C) (10/05 0526) Pulse Rate:  [65-85] 66 (10/05 0526) Resp:  [13-26] 20 (10/05 0526) BP: (100-131)/(55-84) 107/59 (10/05 0526) SpO2:  [88 %-97 %] 96 % (10/05 0526)  Intake/Output from previous day:  Intake/Output Summary (Last 24 hours) at 06/03/2021 0748 Last data filed at 06/03/2021 0504 Gross per 24 hour  Intake 1250.32 ml  Output 100 ml  Net 1150.32 ml    Intake/Output this shift: No intake/output data recorded.  Labs: Recent Labs    06/03/21 0355  HGB 10.3*   Recent Labs    06/03/21 0355  WBC 8.0  RBC 3.28*  HCT 31.4*  PLT 293   Recent Labs    06/03/21 0355  NA 137  K 4.0  CL 104  CO2 26  BUN 13  CREATININE 0.87  GLUCOSE 137*  CALCIUM 8.0*   No results for input(s): LABPT, INR in the last 72 hours.  EXAM General - Patient is Alert, Appropriate, and Oriented Extremity - ABD soft Neurovascular intact Incision: dressing C/D/I No cellulitis present Patient is intact to light touch to the right arm. Able to flex and extend all fingers without pain. Dressing/Incision - clean, dry, no drainage Motor Function - intact, moving foot and toes well on exam.  Abdomen soft with normal bowel sounds.  Past Medical History:  Diagnosis Date   Acute midline low back pain without sciatica 08/13/2016   Acute on chronic respiratory failure with hypoxia ([place]) 08/21/2018   Acute respiratory failure with hypoxia ([place]) 12/11/2017   Allergy    Aortic atherosclerosis ([place])    Atypical atrial flutter  ([place])    a.) s/p ablation 07/27/2013 and 05/10/2016   Balance problem    CAD (coronary artery disease)    a. s/p MI x 2 in 2002 s/p PCI x 2 in 2002; b. s/p 2v CABG 2002; c. stress echo 07/2004 w/ evi of pos & inf infarct & no evi of ischemia; d. 4/08 dipyridamole scan w/ multiple areas of infarct, no ischemia, EF 49%; e. cath 04/28/15 3v CAD, med Rx rec, no targets for revasc, LM lum irregs, pLAD 30%, 100%, ost-pLCx 60%, [name] 99%, OM2 100%, p-mRCA 90%, [name] 100% L-R collats, VG-mLAD irregs, VG-OM2 oc   Chronic anticoagulation    a.) Apixaban   Chronic systolic CHF (congestive heart failure) ([place])    a. echo 03/2015: EF 30-35%, sev ant/inf/pos HK, in mild to mod MR   Complication of anesthesia    a.) postoperative apnea. b.) postoperative hypoxia.   Compressed spine fracture ([place]) 08/06/2016   a.) L2, T11, T12   COPD (chronic obstructive pulmonary disease) (H[facility])    Deep vein thrombosis (DVT) of left lower extremity ([place]) 12/2001   Fracture of multiple pubic rami, right, closed, initial encounter ([place]) 05/27/2016   a.) RIGHT superior and inferior pubic rami fractures s/p mechanical fall.   GERD (gastroesophageal reflux disease)    HLD (hyperlipidemia)    HTN (hypertension)    Hypothyroidism    Ischemic cardiomyopathy    Mitral regurgitation    a. s/p  mitral ring placement 09/2000; b. echo 09/2010: EF 50%, inf HK, post AK, mild MR, prosthetic mitral valve ring w/ peak gradient of 10 mmHg; b. echo 2/13: EF 50%, mild MR/TR      Myocardial infarction ([place]) 2002   a.) x 2 in 2002; PCI with stents (unknown type) placed to the p-mRCA, [name], and [name].   Neuropathy    Osteoarthritis    Osteoporosis    Oxygen dependent    a.) 3 L/[place]   Pacemaker    a. MDT 2002; b. generator replacement 2013; c. followed by Dr. Omelia Blackwater, MD   PAF (paroxysmal atrial fibrillation) [facility])    a.) s/p DCCV on 04/08/2017. b.) on apixaban   Personal history of chemotherapy    Personal history of radiation therapy     Pneumonia    S/P CABG x 2 2002   a.) SVG-LAD, SVG-OM   Squamous cell carcinoma of right lung ([place]) 01/03/2015   a.) stage IIIa. b.) s/p concurrent chemotherapy (carboplatin + paclitaxel) and XRT (IMRT 6000 cGy)   SVT (supraventricular tachycardia) (HCC)    Type II diabetes mellitus with complication ([place]) 78/93/8101    Assessment/Plan: 1 Day Post-Op Procedure(s) (LRB): REVERSE SHOULDER ARTHROPLASTY (Right) Active Problems:   Status post reverse arthroplasty of shoulder, right  Estimated body mass index is 24.21 kg/m as calculated from the following:   Height as of this encounter: 5\' 6"  (1.676 m).   Weight as of this encounter: 68 kg. Advance diet Up with therapy D/C IV fluids when tolerating po intake.  Labs reviewed this AM.   Hg 10.3 this morning. Patient performed well with PT yesterday.  Up with therapy today. 96% on room air this morning. Plan for discharge home this afternoon with HHPT.  DVT Prophylaxis - Foot Pumps, TED hose, and Eliquis Non-weightbearing to the right arm.  Raquel Jimesha Rising, PA-C [facility] 06/03/2021, 7:48 AM

## 2021-06-03 NOTE — Evaluation (Signed)
Occupational Therapy Evaluation Patient Details Name: Jenny Cooper MRN: [id number] DOB: [birth year] Today's Date: 06/03/2021   History of Present Illness 85 y/o female who fell out of bed suffering humeral fracture 9/22, now s/p R total shoulder 10/4.   Clinical Impression   Patient presenting with decreased Ind in self care, balance, functional mobility, and knowledge of precautions. Pt's daughter present for education and hands on training. OT reviewed precautions with pt who verbalized understanding. OT educated pt on polar care system, sling use, and hand/wrist/elbow exercises. Pt continues to have decreased sensation in R UE from surgery. Pt's daughter returning demonstrations and pt dressed this session, sling doffed/donned, and polar care placed. Handouts provided as reference for when pt returns home. Pt with have assistance from family at discharge. Pt with no further acute OT needs at this time.      Recommendations for follow up therapy are one component of a multi-disciplinary discharge planning process, led by the attending physician.  Recommendations may be updated based on patient status, additional functional criteria and insurance authorization.   Follow Up Recommendations  No OT follow up;Follow surgeon's recommendation for DC plan and follow-up therapies    Equipment Recommendations  None recommended by OT;Other (comment) (Pt has all needed equipment)       Precautions / Restrictions Precautions Precautions: Shoulder Type of Shoulder Precautions: total Shoulder Interventions: Shoulder abduction pillow Restrictions Weight Bearing Restrictions: Yes RUE Weight Bearing: Non weight bearing      Mobility Bed Mobility               General bed mobility comments: seated in recliner chair    Transfers Overall transfer level: Needs assistance Equipment used: None;1 person hand held assist Transfers: Sit to/from Stand Sit to Stand: Min guard          General transfer comment: needing min guard for balance with clothing management    Balance Overall balance assessment: Mild deficits observed, not formally tested                                         ADL either performed or assessed with clinical judgement   ADL Overall ADL's : Needs assistance/impaired                                       General ADL Comments: Min A for UB and LB self care this session with education on proper technique to maintain precautions.     Vision Patient Visual Report: No change from baseline              Pertinent Vitals/Pain Pain Assessment: No/denies pain     Hand Dominance Right   Extremity/Trunk Assessment Upper Extremity Assessment Upper Extremity Assessment: Generalized weakness   Lower Extremity Assessment Lower Extremity Assessment: Generalized weakness;Overall WFL for tasks assessed       Communication Communication Communication: No difficulties   Cognition Arousal/Alertness: Awake/alert Behavior During Therapy: WFL for tasks assessed/performed Overall Cognitive Status: Within Functional Limits for tasks assessed                                                Home Living Family/patient expects to  be discharged to:: Private residence Living Arrangements: Other relatives Available Help at Discharge: Family   Home Access: Stairs to enter Technical brewer of Steps: 3 Entrance Stairs-Rails: Left;Right Home Layout: Able to live on main level with bedroom/bathroom         Bathroom Toilet: Handicapped height     Home Equipment: Environmental consultant - 4 wheels;Grab bars - toilet          Prior Functioning/Environment Level of Independence: Independent with assistive device(s)        Comments: Apparently family has been providing HHA for the last 2 weeks (since fall) but prior to that she managed to get around in the home w/o assist or AD        OT Problem List:         OT Treatment/Interventions:      OT Goals(Current goals can be found in the care plan section) Acute Rehab OT Goals Patient Stated Goal: go home OT Goal Formulation: With patient/family Time For Goal Achievement: 06/17/21 Potential to Achieve Goals: Good   AM-PAC OT "6 Clicks" Daily Activity     Outcome Measure Help from another person eating meals?: A Little Help from another person taking care of personal grooming?: None Help from another person toileting, which includes using toliet, bedpan, or urinal?: A Little Help from another person bathing (including washing, rinsing, drying)?: A Little Help from another person to put on and taking off regular upper body clothing?: A Little Help from another person to put on and taking off regular lower body clothing?: A Little 6 Click Score: 19   End of Session Equipment Utilized During Treatment: Other (comment) (shoulder immobilizer and polar care system) Nurse Communication: Mobility status  Activity Tolerance: Patient tolerated treatment well Patient left: in chair;with call bell/phone within reach;with family/visitor present  OT Visit Diagnosis: Pain Pain - Right/Left: Right Pain - part of body: Shoulder                Time: 4917-9150 OT Time Calculation (min): 43 min Charges:  OT General Charges $OT Visit: 1 Visit OT Evaluation $OT Eval Low Complexity: 1 Low OT Treatments $Self Care/Home Management : 23-37 mins  Darleen Crocker, MS, OTR/L , CBIS ascom [phone]  06/03/21, 10:48 AM

## 2021-06-03 NOTE — Progress Notes (Signed)
Pt discharged to home.  IV removed without complication.  AVS given to pt and explained with no further questions.  All belongings at bedside taken with pt.  Pt transported off unit via WC

## 2021-06-03 NOTE — Progress Notes (Signed)
Met with the patient and her daughter in the room at the bedside, She lives with her son, she has transportation Spoke with her on ways to save money on regular medications Encouraged her to check with [employer] stores on pricing and compare to other places,  She would like to get a cane and wants to get it on her own so to get a pretty one She is set up with [place] for [facility] services  No additional needs

## 2021-06-03 NOTE — Discharge Summary (Signed)
Physician Discharge Summary  Patient ID: Jenny Cooper MRN: [id number] DOB/AGE: [birth year] 85 y.o.  Admit date: 06/02/2021 Discharge date: 06/03/2021  Admission Diagnoses:  Status post reverse arthroplasty of shoulder, right [Z96.611]  Discharge Diagnoses: Patient Active Problem List   Diagnosis Date Noted   Status post reverse arthroplasty of shoulder, right 06/02/2021   Squamous cell lung cancer ([place]) 02/11/2020   Drug-induced polyneuropathy ([place]) 01/23/2020   Low vitamin B12 level 01/14/2019   Pulmonary hypertension, unspecified ([place]) 12/01/2018   Chronic respiratory failure with hypoxia ([place]) 12/01/2018   Normocytic anemia 10/17/2018   Recurrent pleural effusion on right 07/11/2018   Environmental and seasonal allergies 12/20/2017   Muscle spasms of neck 12/20/2017   Coronary artery disease of bypass graft of native heart with stable angina pectoris ([place]) 04/21/2017   Chronic combined systolic and diastolic CHF (congestive heart failure) ([place]) 04/14/2017   Sinus node dysfunction ([place]) 04/06/2017   Back pain of thoracolumbar region 03/15/2017   PAF (paroxysmal atrial fibrillation) ([place]) 03/05/2017   Falls frequently 03/05/2017   Compression fracture of L2 lumbar vertebra ([place]) 08/31/2016   Cardiomyopathy ([place]) 04/29/2016   Hx of adenomatous colonic polyps 11/17/2015   Radiation pneumonitis ([place]) 10/21/2015   Mitral regurgitation    HLD (hyperlipidemia)    Paroxysmal supraventricular tachycardia (HCC)    NSTEMI (non-ST elevated myocardial infarction) ([place])    Arteriosclerosis of coronary artery 01/11/2015   Hypothyroidism (acquired) 01/11/2015   Disorder of peripheral nervous system 10/04/2014   Cervical radiculopathy, chronic 10/04/2014   Atrial flutter ([place]) 11/16/2013   Chronic obstructive pulmonary disease ([place]) 04/24/2012   Essential hypertension 08/03/2011   Pacemaker 08/03/2011    Past Medical History:  Diagnosis Date   Acute midline low back pain without  sciatica 08/13/2016   Acute on chronic respiratory failure with hypoxia ([place]) 08/21/2018   Acute respiratory failure with hypoxia ([place]) 12/11/2017   Allergy    Aortic atherosclerosis ([place])    Atypical atrial flutter ([place])    a.) s/p ablation 07/27/2013 and 05/10/2016   Balance problem    CAD (coronary artery disease)    a. s/p MI x 2 in 2002 s/p PCI x 2 in 2002; b. s/p 2v CABG 2002; c. stress echo 07/2004 w/ evi of pos & inf infarct & no evi of ischemia; d. 4/08 dipyridamole scan w/ multiple areas of infarct, no ischemia, EF 49%; e. cath 04/28/15 3v CAD, med Rx rec, no targets for revasc, LM lum irregs, pLAD 30%, 100%, ost-pLCx 60%, [name] 99%, OM2 100%, p-mRCA 90%, [name] 100% L-R collats, VG-mLAD irregs, VG-OM2 oc   Chronic anticoagulation    a.) Apixaban   Chronic systolic CHF (congestive heart failure) ([place])    a. echo 03/2015: EF 30-35%, sev ant/inf/pos HK, in mild to mod MR   Complication of anesthesia    a.) postoperative apnea. b.) postoperative hypoxia.   Compressed spine fracture ([place]) 08/06/2016   a.) L2, T11, T12   COPD (chronic obstructive pulmonary disease) (HCC)    Deep vein thrombosis (DVT) of left lower extremity ([place]) 12/2001   Fracture of multiple pubic rami, right, closed, initial encounter ([place]) 05/27/2016   a.) RIGHT superior and inferior pubic rami fractures s/p mechanical fall.   GERD (gastroesophageal reflux disease)    HLD (hyperlipidemia)    HTN (hypertension)    Hypothyroidism    Ischemic cardiomyopathy    Mitral regurgitation    a. s/p mitral ring placement 09/2000; b. echo 09/2010: EF 50%, inf HK, post AK, mild MR,  prosthetic mitral valve ring w/ peak gradient of 10 mmHg; b. echo 2/13: EF 50%, mild MR/TR      Myocardial infarction ([place]) 2002   a.) x 2 in 2002; PCI with stents (unknown type) placed to the p-mRCA, [name], and [name].   Neuropathy    Osteoarthritis    Osteoporosis    Oxygen dependent    a.) 3 L/   Pacemaker    a. MDT 2002; b. generator  replacement 2013; c. followed by Dr. Omelia Blackwater, MD   PAF (paroxysmal atrial fibrillation) [facility])    a.) s/p DCCV on 04/08/2017. b.) on apixaban   Personal history of chemotherapy    Personal history of radiation therapy    Pneumonia    S/P CABG x 2 2002   a.) SVG-LAD, SVG-OM   Squamous cell carcinoma of right lung ([place]) 01/03/2015   a.) stage IIIa. b.) s/p concurrent chemotherapy (carboplatin + paclitaxel) and XRT (IMRT 6000 cGy)   SVT (supraventricular tachycardia) (HCC)    Type II diabetes mellitus with complication ([place]) 76/16/0737     Transfusion: None.   Consultants (if any):   Discharged Condition: Improved  Hospital Course: Jenny Cooper is an 85 y.o. female who was admitted 06/02/2021 with a diagnosis of a comminuted intra-articular proximal humerus fracture of the right shoulder and went to the operating room on 06/02/2021 and underwent the above named procedures.    Surgeries: Procedure(s): REVERSE SHOULDER ARTHROPLASTY on 06/02/2021 Patient tolerated the surgery well. Taken to PACU where she was stabilized and then transferred to the orthopedic floor.  She was continued on Eliquis 2.5mg  twice daily. Foot pumps applied bilaterally at 80 mm. Heels elevated on bed with rolled towels. No evidence of DVT. Negative [place]. Physical therapy started on day #1 for gait training and transfer. OT started day #1 for ADL and assisted devices.  Patient's IV was removed on POD1.  Implants: All press-fit Integra system with a 13 mm stem, a small metaphyseal body, a +0 mm humeral platform, a mini baseplate, and a 38 mm concentric +2 mm laterally offset glenosphere.  She was given perioperative antibiotics:  Anti-infectives (From admission, onward)    Start     Dose/Rate Route Frequency Ordered Stop   06/02/21 1430  ceFAZolin (ANCEF) IVPB 2g/100 mL premix        2 g 200 mL/hr over 30 Minutes Intravenous Every 6 hours 06/02/21 1417 06/03/21 0515   06/02/21 0625  ceFAZolin (ANCEF) 2-4  GM/100ML-% IVPB       Note to Pharmacy: Herby Abraham   : cabinet override      06/02/21 0625 06/02/21 0822   06/02/21 0615  ceFAZolin (ANCEF) IVPB 2g/100 mL premix        2 g 200 mL/hr over 30 Minutes Intravenous  Once 06/02/21 0600 06/02/21 0751     .  She was given sequential compression devices, early ambulation, and Eliquis for DVT prophylaxis.  She benefited maximally from the hospital stay and there were no complications.    Recent vital signs:  Vitals:   06/02/21 2024 06/03/21 0526  BP:  (!) 107/59  Pulse:  66  Resp:  20  Temp:  97.9 F (36.6 C)  SpO2: 93% 96%    Recent laboratory studies:  Lab Results  Component Value Date   HGB 10.3 (L) 06/03/2021   HGB 11.3 (L) 05/28/2021   HGB 13.8 12/17/2020   Lab Results  Component Value Date   WBC 8.0 06/03/2021   PLT 293 06/03/2021  Lab Results  Component Value Date   INR 1.3 (H) 05/28/2021   Lab Results  Component Value Date   NA 137 06/03/2021   K 4.0 06/03/2021   CL 104 06/03/2021   CO2 26 06/03/2021   BUN 13 06/03/2021   CREATININE 0.87 06/03/2021   GLUCOSE 137 (H) 06/03/2021    Discharge Medications:   Allergies as of 06/03/2021       Reactions   Cefdinir Rash   Had hives after completing treatment - unsure if it was the cause. Has received 1st generation cephalosporin many times (04/24/2012, 07/27/2013, 09/28/2016, 02/20/2018, 07/11/2018, 08/10/2018 without documented ADRs.   Lovenox [enoxaparin Sodium] Itching        Medication List     TAKE these medications    acetaminophen 500 MG tablet Commonly known as: TYLENOL Take 500-1,000 mg by mouth every 4 (four) hours as needed for mild pain or moderate pain.   apixaban 2.5 MG Tabs tablet Commonly known as: Eliquis Take 1 tablet (2.5 mg total) by mouth 2 (two) times daily.   budesonide 0.5 MG/2ML nebulizer solution Commonly known as: PULMICORT INHALE 2 MILLILITERS BY NEBULIZER 2 TIMES DAILY   CENTRUM SILVER PO Take 1 tablet by mouth  daily.   cetirizine 10 MG tablet Commonly known as: ZYRTEC TAKE (1) TABLET BY MOUTH EVERY DAY   Entresto 24-26 MG Generic drug: sacubitril-valsartan TAKE (1) TABLET BY MOUTH TWICE DAILY   fluticasone 50 MCG/ACT nasal spray Commonly known as: Flonase Place 2 sprays into both nostrils daily. What changed:  how much to take when to take this reasons to take this   furosemide 20 MG tablet Commonly known as: LASIX TAKE 1 TABLET BY MOUTH TWICE DAILY, ALTERNATING WITH 1 EXTRA TABLET AS NEEDED FOR SWELLING. What changed: See the new instructions.   ipratropium-albuterol 0.5-2.5 (3) MG/3ML Soln Commonly known as: DUONEB TAKE 3 MLS BY NEBULIZATION EVERY 6 HOURSAS NEEDED FOR WHEEZING OR SHORTNESS OF BREATH   ketorolac 0.5 % ophthalmic solution Commonly known as: ACULAR Place 1 drop into the left eye in the morning, at noon, and at bedtime.   levothyroxine 88 MCG tablet Commonly known as: SYNTHROID TAKE (1) TABLET BY MOUTH EVERY DAY BEFORE BREAKFAST   metoprolol succinate 25 MG 24 hr tablet Commonly known as: TOPROL-XL Take 2 tablets (50 mg total) by mouth in the morning and at bedtime.   Mucinex 600 MG 12 hr tablet Generic drug: guaiFENesin Take 1 tablet (600 mg total) by mouth 2 (two) times daily as needed.   ondansetron 4 MG tablet Commonly known as: ZOFRAN Take 1 tablet (4 mg total) by mouth every 6 (six) hours as needed for nausea or vomiting.   oxyCODONE 5 MG immediate release tablet Commonly known as: Oxy IR/ROXICODONE Take 1 tablet (5 mg total) by mouth every 4 (four) hours as needed for moderate pain. What changed:  when to take this reasons to take this   OXYGEN Inhale 3 L into the lungs.   PREPARATION H RE Place 1 application rectally daily as needed (hemorrhoids).   rosuvastatin 5 MG tablet Commonly known as: CRESTOR TAKE ONE (1) TABLET BY MOUTH ONCE DAILY What changed: See the new instructions.   vitamin A & D ointment Apply 1 application topically  as needed (irritation).   vitamin B-12 1000 MCG tablet Commonly known as: CYANOCOBALAMIN Take 1,000 mcg by mouth daily.   Yupelri 175 MCG/3ML nebulizer solution Generic drug: revefenacin Use one vial in nebulizer once daily. Do not mix with  other nebulized medications.        Diagnostic Studies: DG Shoulder Right  Result Date: 05/19/2021 CLINICAL DATA:  Right shoulder pain after fall. EXAM: RIGHT SHOULDER - 2+ VIEW COMPARISON:  None. FINDINGS: Severely displaced and comminuted fracture is seen involving the proximal right humeral head. Degenerative changes seen involving the right acromioclavicular joint. IMPRESSION: Severely displaced and comminuted proximal right humeral head fracture. Electronically Signed   By: Marijo Conception M.D.   On: 05/19/2021 09:48   CT HEAD WO CONTRAST (5MM)  Result Date: 05/19/2021 CLINICAL DATA:  Head trauma, minor (Age >= 65y) EXAM: CT HEAD WITHOUT CONTRAST TECHNIQUE: Contiguous axial images were obtained from the base of the skull through the vertex without intravenous contrast. COMPARISON:  05/27/2016 FINDINGS: Brain: No evidence of acute infarction, hemorrhage, hydrocephalus, extra-axial collection or mass lesion/mass effect. Vascular: Atherosclerotic calcifications involving the large vessels of the skull base. No unexpected hyperdense vessel. Skull: Normal. Negative for fracture or focal lesion. Sinuses/Orbits: Partial opacification of the right sphenoid sinus. Chronic partial opacification of the left sphenoid sinus. There is also partial opacification within the right mastoid air cells. Other: None. IMPRESSION: 1. No acute intracranial findings. 2. Chronic bilateral sphenoid sinus disease. Partial opacification within the right mastoid air cells. Electronically Signed   By: Davina Poke D.O.   On: 05/19/2021 11:48   CT Cervical Spine Wo Contrast  Result Date: 05/19/2021 CLINICAL DATA:  Neck trauma, fell while getting out of bed this morning EXAM: CT  CERVICAL SPINE WITHOUT CONTRAST TECHNIQUE: Multidetector CT imaging of the cervical spine was performed without intravenous contrast. Multiplanar CT image reconstructions were also generated. COMPARISON:  None FINDINGS: Alignment: Normal Skull base and vertebrae: Osseous demineralization. Disc space narrowing and endplate spur formation greatest at C5-C6 and C6-C7. Multilevel facet degenerative changes. Vertebral body heights maintained without fracture or subluxation. Visualized skull base intact. Soft tissues and spinal canal: Prevertebral soft tissues normal thickness. Atherosclerotic calcifications at carotid bifurcations. RIGHT jugular line. Disc levels:  No specific abnormalities Upper chest: Asymmetric RIGHT apical lung scarring with pleuroparenchymal thickening Other: N/A IMPRESSION: Multilevel degenerative disc and facet disease changes of the cervical spine. No acute cervical spine abnormalities. Asymmetric RIGHT apical lung scarring with pleuroparenchymal thickening. Electronically Signed   By: Lavonia Dana M.D.   On: 05/19/2021 11:49   CT SHOULDER RIGHT WO CONTRAST  Result Date: 05/25/2021 CLINICAL DATA:  [place] out of bed recent. EXAM: CT OF THE UPPER RIGHT EXTREMITY WITHOUT CONTRAST TECHNIQUE: Multidetector CT imaging of the right shoulder was performed according to the standard protocol. COMPARISON:  Right shoulder x-rays dated May 19, 2021. FINDINGS: Bones/Joint/Cartilage Acute comminuted impacted fracture of humeral head and neck with involvement of the greater and lesser tuberosities. The posterior greater tuberosity fragment is displaced posteriorly 1.2 cm. The medial humeral head fragment is impacted up to 2.2 cm. Inferior subluxation of humeral head. Tiny well corticated ossific density along the posterosuperior glenoid is likely degenerative. Mild acromioclavicular osteoarthritis. Small joint effusion. Subacromial/subdeltoid lipohemobursitis. Ligaments Ligaments are suboptimally  evaluated by CT. Muscles and Tendons Grossly intact.  No muscle atrophy Soft tissue No fluid collection or hematoma.  No soft tissue mass. Chronic interstitial lung disease. IMPRESSION: 1. Acute comminuted, impacted fracture of the humeral head and neck with involvement of the greater and lesser tuberosities. 2. Inferior humeral head subluxation. 3. Subacromial/subdeltoid lipohemobursitis. Electronically Signed   By: Titus Dubin M.D.   On: 05/25/2021 14:53   DG Chest Portable 1 View  Result Date: 05/19/2021 CLINICAL  DATA:  [place] getting out of bed this morning, RIGHT upper arm pain, eye surgery yesterday EXAM: PORTABLE CHEST 1 VIEW COMPARISON:  Portable exam 1117 hours compared to 08/17/2019 FINDINGS: RIGHT jugular Port-A-Cath with tip projecting over SVC. Sequential pacemaker leads via LEFT subclavian approach project over RIGHT atrium and RIGHT ventricle. Enlargement of cardiac silhouette post MVR and CABG. Atherosclerotic calcification aorta. Parenchymal scarring RIGHT mid lung with chronic bibasilar interstitial changes/fibrosis again seen. No acute infiltrate, pleural effusion, or pneumothorax. Diffuse osseous demineralization. Comminuted displaced fracture at surgical neck RIGHT humerus including displaced greater tuberosity fragments. IMPRESSION: Comminuted displaced proximal RIGHT humeral fracture. Enlargement of cardiac silhouette post pacemaker, CABG and MVR. Chronic interstitial lung disease changes at both lung bases with chronic scarring in RIGHT mid lung. Electronically Signed   By: Lavonia Dana M.D.   On: 05/19/2021 11:56   DG Shoulder Right Port  Result Date: 06/02/2021 CLINICAL DATA:  Right shoulder replacement. EXAM: PORTABLE RIGHT SHOULDER COMPARISON:  CT right shoulder dated May 25, 2021. FINDINGS: The right shoulder demonstrates a reverse total shoulder arthroplasty without evidence of hardware failure or complication. There is expected intra-articular air. Fractured greater  tuberosity fragment remains. The alignment is anatomic. Post-surgical changes noted in the surrounding soft tissues. Right chest wall port catheter. IMPRESSION: 1. Reverse right total shoulder arthroplasty without evidence of acute postoperative complication. Electronically Signed   By: Titus Dubin M.D.   On: 06/02/2021 13:53    Disposition: Plan for discharge home this afternoon after working with physical therapy.     Follow-up Information     Lattie Corns, PA-C Follow up in 14 day(s).   Specialty: Physician Assistant Why: Electa Sniff information: [place] [address] [phone]                Signed: Judson Roch PA-C 06/03/2021, 7:54 AM

## 2021-06-03 NOTE — Progress Notes (Signed)
Physical Therapy Treatment Patient Details Name: Jenny Cooper MRN: [id number] DOB: June 10, 1936 Today's Date: 06/03/2021   History of Present Illness 85 y/o female who fell out of bed suffering humeral fracture 9/22, now s/p R total shoulder 10/4.    PT Comments    Pt again eager to work with PT, daughter present and involved t/o the session.  Pt was able to ambulate ~150 ft.  Pt normally wears 3L O2 and reports sats stay in the low 90s most of the time, she did have sats (on 3L) drop from mid 90s to mid 80s with the effort.  All questions answered, pt and daughter endorse good understanding of mobility, safety, equipment management, etc.   Recommendations for follow up therapy are one component of a multi-disciplinary discharge planning process, led by the attending physician.  Recommendations may be updated based on patient status, additional functional criteria and insurance authorization.  Follow Up Recommendations  Follow surgeon's recommendation for DC plan and follow-up therapies;Supervision - Intermittent     Equipment Recommendations  Cane    Recommendations for Other Services       Precautions / Restrictions Precautions Precautions: Shoulder Type of Shoulder Precautions: total Shoulder Interventions: Shoulder abduction pillow Restrictions Weight Bearing Restrictions: Yes RUE Weight Bearing: Non weight bearing     Mobility  Bed Mobility Overal bed mobility: Modified Independent Bed Mobility: Supine to Sit     Supine to sit: Min guard     General bed mobility comments: slow to get to standing, but able to do so t/o direct assist    Transfers Overall transfer level: Needs assistance Equipment used: Straight cane Transfers: Sit to/from Stand Sit to Stand: From elevated surface;Min guard         General transfer comment: attempted from low/standard bed height w/o assist and she was unable to rise, up ~2" and she managed the transition with only  CGA  Ambulation/Gait Ambulation/Gait assistance: Min guard Gait Distance (Feet): 150 Feet Assistive device: Straight cane       General Gait Details: Pt with slow but safe ambulation.  Minimal use/reliance on cane but did have some fatigue.  Pt on 3L O2 with sats slowly dropping from mid 90s to mid 80s.  Pt with no shortness of breath, but some subjective fatigue with increased distance.   Stairs Stairs: Yes Stairs assistance: Min guard Stair Management: One rail Right;One rail Left;Forwards Number of Stairs: 4 General stair comments: Pt able to ascend/descend steps w/o direct assist, definite UE reliance, but no direct assist needed.   Wheelchair Mobility    Modified Rankin (Stroke Patients Only)       Balance Overall balance assessment: Mild deficits observed, not formally tested                                          Cognition Arousal/Alertness: Awake/alert Behavior During Therapy: WFL for tasks assessed/performed Overall Cognitive Status: Within Functional Limits for tasks assessed                                        Exercises      General Comments        Pertinent Vitals/Pain Pain Assessment: 0-10 Pain Score: 1     Home Living Family/patient expects to be discharged to:: Private residence Living  Arrangements: Other relatives Available Help at Discharge: Family   Home Access: Stairs to enter Entrance Stairs-Rails: Left;Right Home Layout: Able to live on main level with bedroom/bathroom Home Equipment: Walker - 4 wheels;Grab bars - toilet      Prior Function Level of Independence: Independent with assistive device(s)      Comments: Apparently family has been providing HHA for the last 2 weeks (since fall) but prior to that she managed to get around in the home w/o assist or AD   PT Goals (current goals can now be found in the care plan section) Acute Rehab PT Goals Patient Stated Goal: go home Progress  towards PT goals: Progressing toward goals    Frequency    BID      PT Plan Current plan remains appropriate    Co-evaluation              AM-PAC PT "6 Clicks" Mobility   Outcome Measure  Help needed turning from your back to your side while in a flat bed without using bedrails?: A Little Help needed moving from lying on your back to sitting on the side of a flat bed without using bedrails?: A Little Help needed moving to and from a bed to a chair (including a wheelchair)?: A Little Help needed standing up from a chair using your arms (e.g., wheelchair or bedside chair)?: A Little Help needed to walk in hospital room?: A Little Help needed climbing 3-5 steps with a railing? : A Lot 6 Click Score: 17    End of Session Equipment Utilized During Treatment: Gait belt;Oxygen Activity Tolerance: Patient limited by fatigue Patient left: with chair alarm set;with call bell/phone within reach;with family/visitor present Nurse Communication: Mobility status PT Visit Diagnosis: Muscle weakness (generalized) (M62.81);Difficulty in walking, not elsewhere classified (R26.2);Pain Pain - Right/Left: Right Pain - part of body: Shoulder     Time: 5625-6389 PT Time Calculation (min) (ACUTE ONLY): 49 min  Charges:  $Gait Training: 23-37 mins $Therapeutic Exercise: 8-22 mins                     Kreg Shropshire, DPT 06/03/2021, 12:20 PM

## 2021-06-04 LAB — SURGICAL PATHOLOGY

## 2021-06-17 ENCOUNTER — Other Ambulatory Visit: Payer: Medicare Other

## 2021-06-18 ENCOUNTER — Other Ambulatory Visit: Payer: Self-pay | Admitting: Internal Medicine

## 2021-06-22 ENCOUNTER — Other Ambulatory Visit: Payer: Self-pay

## 2021-06-23 ENCOUNTER — Other Ambulatory Visit: Payer: Self-pay

## 2021-06-23 ENCOUNTER — Emergency Department: Payer: Medicare Other

## 2021-06-23 ENCOUNTER — Encounter: Payer: Self-pay | Admitting: Physician Assistant

## 2021-06-23 ENCOUNTER — Ambulatory Visit (INDEPENDENT_AMBULATORY_CARE_PROVIDER_SITE_OTHER): Payer: Medicare Other | Admitting: Physician Assistant

## 2021-06-23 ENCOUNTER — Inpatient Hospital Stay
Admission: EM | Admit: 2021-06-23 | Discharge: 2021-06-29 | DRG: 640 | Disposition: A | Discharge status: Home Health | Payer: Medicare Other | Attending: Internal Medicine | Admitting: Internal Medicine

## 2021-06-23 ENCOUNTER — Telehealth: Payer: Self-pay | Admitting: *Deleted

## 2021-06-23 ENCOUNTER — Other Ambulatory Visit
Admission: RE | Admit: 2021-06-23 | Discharge: 2021-06-23 | Disposition: A | Discharge status: Home / Self Care | Payer: Medicare Other | Source: Ambulatory Visit | Attending: Physician Assistant | Admitting: Physician Assistant

## 2021-06-23 VITALS — BP 90/60 | HR 63 | Ht 66.0 in | Wt 148.0 lb

## 2021-06-23 DIAGNOSIS — E785 Hyperlipidemia, unspecified: Secondary | ICD-10-CM | POA: Diagnosis present

## 2021-06-23 DIAGNOSIS — J9611 Chronic respiratory failure with hypoxia: Secondary | ICD-10-CM | POA: Diagnosis present

## 2021-06-23 DIAGNOSIS — Z85118 Personal history of other malignant neoplasm of bronchus and lung: Secondary | ICD-10-CM

## 2021-06-23 DIAGNOSIS — Z825 Family history of asthma and other chronic lower respiratory diseases: Secondary | ICD-10-CM

## 2021-06-23 DIAGNOSIS — I25709 Atherosclerosis of coronary artery bypass graft(s), unspecified, with unspecified angina pectoris: Secondary | ICD-10-CM

## 2021-06-23 DIAGNOSIS — R55 Syncope and collapse: Secondary | ICD-10-CM | POA: Diagnosis present

## 2021-06-23 DIAGNOSIS — I10 Essential (primary) hypertension: Secondary | ICD-10-CM | POA: Diagnosis present

## 2021-06-23 DIAGNOSIS — Z8673 Personal history of transient ischemic attack (TIA), and cerebral infarction without residual deficits: Secondary | ICD-10-CM

## 2021-06-23 DIAGNOSIS — I480 Paroxysmal atrial fibrillation: Secondary | ICD-10-CM | POA: Diagnosis present

## 2021-06-23 DIAGNOSIS — I420 Dilated cardiomyopathy: Secondary | ICD-10-CM | POA: Diagnosis present

## 2021-06-23 DIAGNOSIS — K5903 Drug induced constipation: Secondary | ICD-10-CM | POA: Diagnosis present

## 2021-06-23 DIAGNOSIS — J302 Other seasonal allergic rhinitis: Secondary | ICD-10-CM | POA: Diagnosis present

## 2021-06-23 DIAGNOSIS — J9621 Acute and chronic respiratory failure with hypoxia: Secondary | ICD-10-CM

## 2021-06-23 DIAGNOSIS — I272 Pulmonary hypertension, unspecified: Secondary | ICD-10-CM

## 2021-06-23 DIAGNOSIS — Z9981 Dependence on supplemental oxygen: Secondary | ICD-10-CM

## 2021-06-23 DIAGNOSIS — R778 Other specified abnormalities of plasma proteins: Secondary | ICD-10-CM | POA: Diagnosis present

## 2021-06-23 DIAGNOSIS — I5042 Chronic combined systolic (congestive) and diastolic (congestive) heart failure: Secondary | ICD-10-CM

## 2021-06-23 DIAGNOSIS — Z923 Personal history of irradiation: Secondary | ICD-10-CM

## 2021-06-23 DIAGNOSIS — I25118 Atherosclerotic heart disease of native coronary artery with other forms of angina pectoris: Secondary | ICD-10-CM | POA: Diagnosis present

## 2021-06-23 DIAGNOSIS — I495 Sick sinus syndrome: Secondary | ICD-10-CM | POA: Diagnosis present

## 2021-06-23 DIAGNOSIS — Z888 Allergy status to other drugs, medicaments and biological substances status: Secondary | ICD-10-CM

## 2021-06-23 DIAGNOSIS — Z7901 Long term (current) use of anticoagulants: Secondary | ICD-10-CM

## 2021-06-23 DIAGNOSIS — M810 Age-related osteoporosis without current pathological fracture: Secondary | ICD-10-CM | POA: Diagnosis present

## 2021-06-23 DIAGNOSIS — G453 Amaurosis fugax: Secondary | ICD-10-CM

## 2021-06-23 DIAGNOSIS — J209 Acute bronchitis, unspecified: Secondary | ICD-10-CM | POA: Diagnosis present

## 2021-06-23 DIAGNOSIS — K7460 Unspecified cirrhosis of liver: Secondary | ICD-10-CM | POA: Diagnosis present

## 2021-06-23 DIAGNOSIS — Z9221 Personal history of antineoplastic chemotherapy: Secondary | ICD-10-CM

## 2021-06-23 DIAGNOSIS — I509 Heart failure, unspecified: Secondary | ICD-10-CM

## 2021-06-23 DIAGNOSIS — I252 Old myocardial infarction: Secondary | ICD-10-CM

## 2021-06-23 DIAGNOSIS — I340 Nonrheumatic mitral (valve) insufficiency: Secondary | ICD-10-CM | POA: Diagnosis present

## 2021-06-23 DIAGNOSIS — E876 Hypokalemia: Principal | ICD-10-CM | POA: Diagnosis present

## 2021-06-23 DIAGNOSIS — I959 Hypotension, unspecified: Secondary | ICD-10-CM | POA: Diagnosis not present

## 2021-06-23 DIAGNOSIS — I110 Hypertensive heart disease with heart failure: Secondary | ICD-10-CM | POA: Diagnosis present

## 2021-06-23 DIAGNOSIS — J449 Chronic obstructive pulmonary disease, unspecified: Secondary | ICD-10-CM | POA: Diagnosis present

## 2021-06-23 DIAGNOSIS — Z86010 Personal history of colonic polyps: Secondary | ICD-10-CM

## 2021-06-23 DIAGNOSIS — Z860101 Personal history of adenomatous and serrated colon polyps: Secondary | ICD-10-CM

## 2021-06-23 DIAGNOSIS — Z90711 Acquired absence of uterus with remaining cervical stump: Secondary | ICD-10-CM

## 2021-06-23 DIAGNOSIS — Z90710 Acquired absence of both cervix and uterus: Secondary | ICD-10-CM

## 2021-06-23 DIAGNOSIS — Z86718 Personal history of other venous thrombosis and embolism: Secondary | ICD-10-CM

## 2021-06-23 DIAGNOSIS — I255 Ischemic cardiomyopathy: Secondary | ICD-10-CM | POA: Diagnosis present

## 2021-06-23 DIAGNOSIS — Z20822 Contact with and (suspected) exposure to covid-19: Secondary | ICD-10-CM | POA: Diagnosis present

## 2021-06-23 DIAGNOSIS — Z79899 Other long term (current) drug therapy: Secondary | ICD-10-CM

## 2021-06-23 DIAGNOSIS — D649 Anemia, unspecified: Secondary | ICD-10-CM

## 2021-06-23 DIAGNOSIS — I081 Rheumatic disorders of both mitral and tricuspid valves: Secondary | ICD-10-CM | POA: Diagnosis present

## 2021-06-23 DIAGNOSIS — E039 Hypothyroidism, unspecified: Secondary | ICD-10-CM | POA: Diagnosis present

## 2021-06-23 DIAGNOSIS — I248 Other forms of acute ischemic heart disease: Secondary | ICD-10-CM | POA: Diagnosis present

## 2021-06-23 DIAGNOSIS — C3490 Malignant neoplasm of unspecified part of unspecified bronchus or lung: Secondary | ICD-10-CM | POA: Diagnosis present

## 2021-06-23 DIAGNOSIS — T502X5A Adverse effect of carbonic-anhydrase inhibitors, benzothiadiazides and other diuretics, initial encounter: Secondary | ICD-10-CM | POA: Diagnosis present

## 2021-06-23 DIAGNOSIS — Z950 Presence of cardiac pacemaker: Secondary | ICD-10-CM

## 2021-06-23 DIAGNOSIS — J441 Chronic obstructive pulmonary disease with (acute) exacerbation: Secondary | ICD-10-CM

## 2021-06-23 DIAGNOSIS — Z87891 Personal history of nicotine dependence: Secondary | ICD-10-CM

## 2021-06-23 DIAGNOSIS — N179 Acute kidney failure, unspecified: Secondary | ICD-10-CM | POA: Diagnosis present

## 2021-06-23 DIAGNOSIS — R7989 Other specified abnormal findings of blood chemistry: Secondary | ICD-10-CM | POA: Diagnosis present

## 2021-06-23 DIAGNOSIS — Z9049 Acquired absence of other specified parts of digestive tract: Secondary | ICD-10-CM

## 2021-06-23 DIAGNOSIS — Z951 Presence of aortocoronary bypass graft: Secondary | ICD-10-CM

## 2021-06-23 DIAGNOSIS — E119 Type 2 diabetes mellitus without complications: Secondary | ICD-10-CM | POA: Diagnosis present

## 2021-06-23 DIAGNOSIS — Z952 Presence of prosthetic heart valve: Secondary | ICD-10-CM

## 2021-06-23 DIAGNOSIS — Z8249 Family history of ischemic heart disease and other diseases of the circulatory system: Secondary | ICD-10-CM

## 2021-06-23 DIAGNOSIS — Z96611 Presence of right artificial shoulder joint: Secondary | ICD-10-CM

## 2021-06-23 DIAGNOSIS — I700 Atherosclerosis of aorta: Secondary | ICD-10-CM | POA: Diagnosis present

## 2021-06-23 DIAGNOSIS — I5043 Acute on chronic combined systolic (congestive) and diastolic (congestive) heart failure: Secondary | ICD-10-CM | POA: Diagnosis present

## 2021-06-23 DIAGNOSIS — T402X5A Adverse effect of other opioids, initial encounter: Secondary | ICD-10-CM | POA: Diagnosis present

## 2021-06-23 DIAGNOSIS — J90 Pleural effusion, not elsewhere classified: Secondary | ICD-10-CM | POA: Diagnosis present

## 2021-06-23 DIAGNOSIS — I5033 Acute on chronic diastolic (congestive) heart failure: Secondary | ICD-10-CM

## 2021-06-23 DIAGNOSIS — I4892 Unspecified atrial flutter: Secondary | ICD-10-CM | POA: Diagnosis present

## 2021-06-23 DIAGNOSIS — Z79890 Hormone replacement therapy (postmenopausal): Secondary | ICD-10-CM

## 2021-06-23 LAB — CBC WITH DIFFERENTIAL/PLATELET
Abs Immature Granulocytes: 0.02 10*3/uL (ref 0.00–0.07)
Basophils Absolute: 0 10*3/uL (ref 0.0–0.1)
Basophils Relative: 0 %
Eosinophils Absolute: 0 10*3/uL (ref 0.0–0.5)
Eosinophils Relative: 0 %
HCT: 34.5 % — ABNORMAL LOW (ref 36.0–46.0)
Hemoglobin: 11 g/dL — ABNORMAL LOW (ref 12.0–15.0)
Immature Granulocytes: 0 %
Lymphocytes Relative: 18 %
Lymphs Abs: 1.1 10*3/uL (ref 0.7–4.0)
MCH: 31 pg (ref 26.0–34.0)
MCHC: 31.9 g/dL (ref 30.0–36.0)
MCV: 97.2 fL (ref 80.0–100.0)
Monocytes Absolute: 0.7 10*3/uL (ref 0.1–1.0)
Monocytes Relative: 12 %
Neutro Abs: 4.5 10*3/uL (ref 1.7–7.7)
Neutrophils Relative %: 70 %
Platelets: 230 10*3/uL (ref 150–400)
RBC: 3.55 MIL/uL — ABNORMAL LOW (ref 3.87–5.11)
RDW: 17.7 % — ABNORMAL HIGH (ref 11.5–15.5)
WBC: 6.4 10*3/uL (ref 4.0–10.5)
nRBC: 0 % (ref 0.0–0.2)

## 2021-06-23 LAB — CBC
HCT: 36.2 % (ref 36.0–46.0)
Hemoglobin: 11.5 g/dL — ABNORMAL LOW (ref 12.0–15.0)
MCH: 31.2 pg (ref 26.0–34.0)
MCHC: 31.8 g/dL (ref 30.0–36.0)
MCV: 98.1 fL (ref 80.0–100.0)
Platelets: 223 10*3/uL (ref 150–400)
RBC: 3.69 MIL/uL — ABNORMAL LOW (ref 3.87–5.11)
RDW: 17.8 % — ABNORMAL HIGH (ref 11.5–15.5)
WBC: 6.9 10*3/uL (ref 4.0–10.5)
nRBC: 0 % (ref 0.0–0.2)

## 2021-06-23 LAB — BASIC METABOLIC PANEL
Anion gap: 11 (ref 5–15)
BUN: 16 mg/dL (ref 8–23)
CO2: 28 mmol/L (ref 22–32)
Calcium: 8.8 mg/dL — ABNORMAL LOW (ref 8.9–10.3)
Chloride: 99 mmol/L (ref 98–111)
Creatinine, Ser: 1.12 mg/dL — ABNORMAL HIGH (ref 0.44–1.00)
GFR, Estimated: 49 mL/min — ABNORMAL LOW (ref >60)
Glucose, Bld: 109 mg/dL — ABNORMAL HIGH (ref 70–99)
Potassium: 2.9 mmol/L — ABNORMAL LOW (ref 3.5–5.1)
Sodium: 138 mmol/L (ref 135–145)

## 2021-06-23 LAB — MAGNESIUM: Magnesium: 1.7 mg/dL (ref 1.7–2.4)

## 2021-06-23 LAB — COMPREHENSIVE METABOLIC PANEL
ALT: 8 U/L (ref 0–44)
AST: 22 U/L (ref 15–41)
Albumin: 3.4 g/dL — ABNORMAL LOW (ref 3.5–5.0)
Alkaline Phosphatase: 82 U/L (ref 38–126)
Anion gap: 11 (ref 5–15)
BUN: 17 mg/dL (ref 8–23)
CO2: 26 mmol/L (ref 22–32)
Calcium: 8.8 mg/dL — ABNORMAL LOW (ref 8.9–10.3)
Chloride: 100 mmol/L (ref 98–111)
Creatinine, Ser: 1.16 mg/dL — ABNORMAL HIGH (ref 0.44–1.00)
GFR, Estimated: 47 mL/min — ABNORMAL LOW (ref >60)
Glucose, Bld: 124 mg/dL — ABNORMAL HIGH (ref 70–99)
Potassium: 2.8 mmol/L — ABNORMAL LOW (ref 3.5–5.1)
Sodium: 137 mmol/L (ref 135–145)
Total Bilirubin: 1.5 mg/dL — ABNORMAL HIGH (ref 0.3–1.2)
Total Protein: 7.7 g/dL (ref 6.5–8.1)

## 2021-06-23 LAB — BRAIN NATRIURETIC PEPTIDE
B Natriuretic Peptide: 2164.2 pg/mL — ABNORMAL HIGH (ref 0.0–100.0)
B Natriuretic Peptide: 2537.6 pg/mL — ABNORMAL HIGH (ref 0.0–100.0)

## 2021-06-23 LAB — RESP PANEL BY RT-PCR (FLU A&B, COVID) ARPGX2
Influenza A by PCR: NEGATIVE
Influenza B by PCR: NEGATIVE
SARS Coronavirus 2 by RT PCR: NEGATIVE

## 2021-06-23 LAB — PROCALCITONIN: Procalcitonin: <0.1 ng/mL

## 2021-06-23 LAB — TROPONIN I (HIGH SENSITIVITY)
Troponin I (High Sensitivity): 26 ng/L — ABNORMAL HIGH (ref <18)
Troponin I (High Sensitivity): 29 ng/L — ABNORMAL HIGH (ref <18)

## 2021-06-23 MED ORDER — ROSUVASTATIN CALCIUM 5 MG PO TABS
5.0000 mg | ORAL_TABLET | Freq: Every day | ORAL | Status: DC
  Administered 2021-06-23 – 2021-06-28 (×6): 5 mg via ORAL
  Filled 2021-06-23 (×6): qty 1

## 2021-06-23 MED ORDER — SODIUM CHLORIDE 0.9 % IV SOLN: INTRAVENOUS | Status: DC | PRN

## 2021-06-23 MED ORDER — LEVOTHYROXINE SODIUM 88 MCG PO TABS
88.0000 ug | ORAL_TABLET | Freq: Every day | ORAL | Status: DC
  Administered 2021-06-24 – 2021-06-29 (×5): 88 ug via ORAL
  Filled 2021-06-23 (×6): qty 1

## 2021-06-23 MED ORDER — VITAMIN B-12 1000 MCG PO TABS
1000.0000 ug | ORAL_TABLET | Freq: Every day | ORAL | Status: DC
  Administered 2021-06-24 – 2021-06-29 (×6): 1000 ug via ORAL
  Filled 2021-06-23 (×7): qty 1

## 2021-06-23 MED ORDER — FLUTICASONE PROPIONATE 50 MCG/ACT NA SUSP
1.0000 | Freq: Every day | NASAL | Status: DC | PRN
  Administered 2021-06-28: 1 via NASAL
  Filled 2021-06-23 (×3): qty 16

## 2021-06-23 MED ORDER — APIXABAN 2.5 MG PO TABS
2.5000 mg | ORAL_TABLET | Freq: Two times a day (BID) | ORAL | Status: DC
  Administered 2021-06-23 – 2021-06-25 (×4): 2.5 mg via ORAL
  Filled 2021-06-23 (×4): qty 1

## 2021-06-23 MED ORDER — IPRATROPIUM-ALBUTEROL 0.5-2.5 (3) MG/3ML IN SOLN
3.0000 mL | Freq: Four times a day (QID) | RESPIRATORY_TRACT | Status: DC | PRN
  Administered 2021-06-25: 3 mL via RESPIRATORY_TRACT
  Filled 2021-06-23: qty 3

## 2021-06-23 MED ORDER — POTASSIUM CHLORIDE 20 MEQ PO PACK
40.0000 meq | PACK | Freq: Two times a day (BID) | ORAL | Status: DC
  Administered 2021-06-23 – 2021-06-26 (×6): 40 meq via ORAL
  Filled 2021-06-23 (×7): qty 2

## 2021-06-23 MED ORDER — ACETAMINOPHEN 325 MG PO TABS
650.0000 mg | ORAL_TABLET | Freq: Four times a day (QID) | ORAL | Status: DC | PRN
  Administered 2021-06-23 – 2021-06-25 (×4): 650 mg via ORAL
  Filled 2021-06-23 (×4): qty 2

## 2021-06-23 MED ORDER — METOPROLOL SUCCINATE ER 50 MG PO TB24: 50.0000 mg | ORAL_TABLET | ORAL | Status: DC

## 2021-06-23 MED ORDER — ACETAMINOPHEN 650 MG RE SUPP: 650.0000 mg | Freq: Four times a day (QID) | RECTAL | Status: DC | PRN

## 2021-06-23 MED ORDER — FUROSEMIDE 10 MG/ML IJ SOLN: 40.0000 mg | Freq: Once | INTRAMUSCULAR | Status: DC

## 2021-06-23 MED ORDER — POTASSIUM CHLORIDE 10 MEQ/100ML IV SOLN
10.0000 meq | INTRAVENOUS | Status: AC
Start: 2021-06-23 — End: 2021-06-23
  Administered 2021-06-23 (×3): 10 meq via INTRAVENOUS
  Filled 2021-06-23: qty 100

## 2021-06-23 MED ORDER — BUDESONIDE 0.5 MG/2ML IN SUSP
0.5000 mg | Freq: Every day | RESPIRATORY_TRACT | Status: DC
  Administered 2021-06-24 – 2021-06-29 (×6): 0.5 mg via RESPIRATORY_TRACT
  Filled 2021-06-23 (×7): qty 2

## 2021-06-23 MED ORDER — REVEFENACIN 175 MCG/3ML IN SOLN
175.0000 ug | Freq: Every day | RESPIRATORY_TRACT | Status: DC
  Administered 2021-06-24 – 2021-06-28 (×4): 175 ug via RESPIRATORY_TRACT
  Filled 2021-06-23 (×6): qty 3

## 2021-06-23 MED ORDER — FUROSEMIDE 10 MG/ML IJ SOLN
40.0000 mg | Freq: Once | INTRAMUSCULAR | Status: AC
  Administered 2021-06-24: 40 mg via INTRAVENOUS
  Filled 2021-06-23: qty 4

## 2021-06-23 MED ORDER — POTASSIUM CHLORIDE 10 MEQ/100ML IV SOLN
10.0000 meq | INTRAVENOUS | Status: DC
Start: 2021-06-23 — End: 2021-06-23

## 2021-06-23 MED ORDER — ONDANSETRON HCL 4 MG PO TABS
4.0000 mg | ORAL_TABLET | Freq: Four times a day (QID) | ORAL | Status: DC | PRN
Start: 2021-06-23 — End: 2021-06-29

## 2021-06-23 MED ORDER — POTASSIUM CHLORIDE 10 MEQ/100ML IV SOLN
INTRAVENOUS | Status: AC
  Filled 2021-06-23: qty 100

## 2021-06-23 MED ORDER — POTASSIUM CHLORIDE CRYS ER 20 MEQ PO TBCR
40.0000 meq | EXTENDED_RELEASE_TABLET | Freq: Once | ORAL | Status: AC
  Administered 2021-06-23: 40 meq via ORAL
  Filled 2021-06-23: qty 2

## 2021-06-23 MED ORDER — HEPARIN SODIUM (PORCINE) 5000 UNIT/ML IJ SOLN: 5000.0000 [IU] | Freq: Three times a day (TID) | INTRAMUSCULAR | Status: DC

## 2021-06-23 MED ORDER — ONDANSETRON HCL 4 MG/2ML IJ SOLN: 4.0000 mg | Freq: Four times a day (QID) | INTRAMUSCULAR | Status: DC | PRN

## 2021-06-23 MED ORDER — OXYCODONE HCL 5 MG PO TABS
5.0000 mg | ORAL_TABLET | Freq: Four times a day (QID) | ORAL | Status: AC | PRN
  Administered 2021-06-23 – 2021-06-25 (×3): 5 mg via ORAL
  Filled 2021-06-23 (×3): qty 1

## 2021-06-23 NOTE — Patient Instructions (Addendum)
Medication Instructions:  HOLD Medications for today.  *If you need a refill on your cardiac medications before your next appointment, please call your pharmacy*   Lab Work: CBC, BMET, and BNP today  If you have labs (blood work) drawn today and your tests are completely normal, you will receive your results only by: [place] (if you have MyChart) OR A paper copy in the mail If you have any lab test that is abnormal or we need to change your treatment, we will call you to review the results.   Testing/Procedures: Your physician has requested that you have an echocardiogram. Echocardiography is a painless test that uses sound waves to create images of your heart. It provides your doctor with information about the size and shape of your heart and how well your heart's chambers and valves are working. This procedure takes approximately one hour. There are no restrictions for this procedure.   Your physician has requested that you have a carotid duplex. This test is an ultrasound of the carotid arteries in your neck. It looks at blood flow through these arteries that supply the brain with blood. Allow one hour for this exam. There are no restrictions or special instructions.    Follow-Up: At [facility], you and your health needs are our priority.  As part of our continuing mission to provide you with exceptional heart care, we have created designated Provider Care Teams.  These Care Teams include your primary Cardiologist (physician) and Advanced Practice Providers (APPs -  Physician Assistants and Nurse Practitioners) who all work together to provide you with the care you need, when you need it.   Your next appointment:   2 week(s)  The format for your next appointment:   In Person  Provider:   You may see Ida Rogue, MD or one of the following Advanced Practice Providers on your designated Care Team:   Murray Hodgkins, NP Christell Faith, PA-C Marrianne Mood,  PA-C Cadence Biltmore, [place]

## 2021-06-23 NOTE — Telephone Encounter (Signed)
Called and reviewed labs and recommendations with patient. We discussed need to proceed over to emergency room for further care with diuresis and treatment for her low potassium level. She verbalized understanding of our conversation, agreement with plan, and had no further questions.  Called over to ED and spoke with Aldona Bar RN to update her on patients information and reason that we sent her over. She verbalized understanding and would watch for her to arrive.

## 2021-06-23 NOTE — Progress Notes (Signed)
Office Visit    Patient Name: Jenny Cooper Date of Encounter: 06/23/2021  PCP:  Glean Hess, MD   [place]  Cardiologist:  Ida Rogue, MD  Advanced Practice Provider:  Alisa Graff, FNP Electrophysiologist:  None :[phone]}   Chief Complaint    Chief Complaint  Patient presents with   Other    Extreme fatigue. Meds reviewed verbally with pt.    85 y.o. female with history of COPD on home oxygen, former tobacco use, stage III squamous cell lung carcinoma s/p chemoradiation therapy 3875, diastolic heart failure, hypertension, paroxysmal atrial fibrillation/flutter on chronic anticoagulation and amiodarone therapy, CAD s/p CABG, PPM, and who presents today for extreme fatigue.  Past Medical History    Past Medical History:  Diagnosis Date   Acute midline low back pain without sciatica 08/13/2016   Acute on chronic respiratory failure with hypoxia ([place]) 08/21/2018   Acute respiratory failure with hypoxia (HCC) 12/11/2017   Allergy    Aortic atherosclerosis (HCC)    Atypical atrial flutter ([place])    a.) s/p ablation 07/27/2013 and 05/10/2016   Balance problem    CAD (coronary artery disease)    a. s/p MI x 2 in 2002 s/p PCI x 2 in 2002; b. s/p 2v CABG 2002; c. stress echo 07/2004 w/ evi of pos & inf infarct & no evi of ischemia; d. 4/08 dipyridamole scan w/ multiple areas of infarct, no ischemia, EF 49%; e. cath 04/28/15 3v CAD, med Rx rec, no targets for revasc, LM lum irregs, pLAD 30%, 100%, ost-pLCx 60%, [name] 99%, OM2 100%, p-mRCA 90%, [name] 100% L-R collats, VG-mLAD irregs, VG-OM2 oc   Chronic anticoagulation    a.) Apixaban   Chronic systolic CHF (congestive heart failure) ([place])    a. echo 03/2015: EF 30-35%, sev ant/inf/pos HK, in mild to mod MR   Complication of anesthesia    a.) postoperative apnea. b.) postoperative hypoxia.   Compressed spine fracture ([place]) 08/06/2016   a.) L2, T11, T12   COPD (chronic obstructive  pulmonary disease) (HCC)    Deep vein thrombosis (DVT) of left lower extremity ([place]) 12/2001   Fracture of multiple pubic rami, right, closed, initial encounter ([facility]) 05/27/2016   a.) RIGHT superior and inferior pubic rami fractures s/p mechanical fall.   GERD (gastroesophageal reflux disease)    HLD (hyperlipidemia)    HTN (hypertension)    Hypothyroidism    Ischemic cardiomyopathy    Mitral regurgitation    a. s/p mitral ring placement 09/2000; b. echo 09/2010: EF 50%, inf HK, post AK, mild MR, prosthetic mitral valve ring w/ peak gradient of 10 mmHg; b. echo 2/13: EF 50%, mild MR/TR      Myocardial infarction ([place]) 2002   a.) x 2 in 2002; PCI with stents (unknown type) placed to the p-mRCA, [name], and [name].   Neuropathy    Osteoarthritis    Osteoporosis    Oxygen dependent    a.) 3 L/[facility]   Pacemaker    a. MDT 2002; b. generator replacement 2013; c. followed by Dr. Omelia Blackwater, MD   PAF (paroxysmal atrial fibrillation) [facility])    a.) s/p DCCV on 04/08/2017. b.) on apixaban   Personal history of chemotherapy    Personal history of radiation therapy    Pneumonia    S/P CABG x 2 2002   a.) SVG-LAD, SVG-OM   Squamous cell carcinoma of right lung ([place]) 01/03/2015   a.) stage IIIa. b.) s/p concurrent  chemotherapy (carboplatin + paclitaxel) and XRT (IMRT 6000 cGy)   SVT (supraventricular tachycardia) (HCC)    Type II diabetes mellitus with complication ([place]) 76/72/0947   Past Surgical History:  Procedure Laterality Date   APPENDECTOMY     CARDIAC CATHETERIZATION N/A 04/28/2015   Procedure: Left Heart Cath and Coronary Angiography;  Surgeon: Wellington Hampshire, MD;  Location: [facility];  Service: Cardiovascular;  Laterality: N/A;   CARDIAC ELECTROPHYSIOLOGY STUDY AND ABLATION N/A 05/10/2016   Procedure(s): INTRACARDIAC ELECTROPHYSIOLOGIC 3D MAPPING, STIMULATION PACING HEART, ICAR CATHETER ABLATION ARRHYTHMIA ADD ON, ABLATE L/R ATRIAL FIBRIL W/ISOLATED PULM VEIN, INTRACARD ECHO,  THER/DX INTERVENT; Location: [facility]; Surgeon: Norm Salt, MD   CARDIOVERSION N/A 04/08/2017   Procedure: CARDIOVERSION;  Surgeon: Wellington Hampshire, MD;  Location: [facility];  Service: Cardiovascular;  Laterality: N/A;   CHEST TUBE INSERTION N/A 07/11/2018   Procedure: SJGGEZ CATH INSERTION;  Surgeon: [name], MD;  Location: [facility];  Service: Thoracic;  Laterality: N/A;   COLONOSCOPY  12/2009   2 small tubular adenomas   CORONARY ANGIOPLASTY WITH STENT PLACEMENT Left 2002   s/p MI x 2 with subsequent PCI x 2 ; stents (unknown type) placed to the p-mRCA, [name], and [name].   CORONARY ARTERY BYPASS GRAFT  09/2000   DRAIN REMOVAL Right 08/10/2018   Procedure: DRAIN REMOVAL;  Surgeon: [name], MD;  Location: [facility];  Service: General;  Laterality: Right;   ECTOPIC PREGNANCY SURGERY     ELECTROMAGNETIC NAVIGATION BROCHOSCOPY N/A 10/06/2015   Procedure: ELECTROMAGNETIC NAVIGATION BRONCHOSCOPY;  Surgeon: Flora Lipps, MD;  Location: [facility];  Service: Cardiopulmonary;  Laterality: N/A;   ENDOBRONCHIAL ULTRASOUND N/A 10/06/2015   Procedure: ENDOBRONCHIAL ULTRASOUND;  Surgeon: Flora Lipps, MD;  Location: [facility];  Service: Cardiopulmonary;  Laterality: N/A;   EYE SURGERY     FLEXIBLE BRONCHOSCOPY Right 07/11/2018   Procedure: FLEXIBLE BRONCHOSCOPY;  Surgeon: [name], MD;  Location: [facility];  Service: Thoracic;  Laterality: Right;   KYPHOPLASTY N/A 09/28/2016   Procedure: KYPHOPLASTY;  Surgeon: Hessie Knows, MD;  Location: [facility];  Service: Orthopedics;  Laterality: N/A;   KYPHOPLASTY N/A 02/20/2018   Procedure: [id number];  Surgeon: Hessie Knows, MD;  Location: [facility];  Service: Orthopedics;  Laterality: N/A;   PACEMAKER INSERTION  03/2012   REVERSE SHOULDER ARTHROPLASTY Right 06/02/2021   Procedure: REVERSE SHOULDER ARTHROPLASTY;  Surgeon: Corky Mull, MD;  Location: [facility];  Service: Orthopedics;  Laterality: Right;   SVT ABLATION N/A 07/27/2013   Procedure:  SVT ABLATION; Location: [facility]; Surgeon: Rolla Etienne, MD   thorocentesis  12/24/2016   VAGINAL HYSTERECTOMY     partial - left ovary remains   VIDEO ASSISTED THORACOSCOPY Right 07/11/2018   Procedure: VIDEO ASSISTED THORACOSCOPY;  Surgeon: [name], MD;  Location: [facility];  Service: Thoracic;  Laterality: Right;   VIDEO ASSISTED THORACOSCOPY (VATS) W/TALC PLEUADESIS Right 07/11/2018   Procedure: THORACOTOMY PLEURAL BIOPSY WITH TALC PLEURODESIS;  Surgeon: [name], MD;  Location: [facility];  Service: Thoracic;  Laterality: Right;  pleural biopsy    Allergies  Allergies  Allergen Reactions   Cefdinir Rash    Had hives after completing treatment - unsure if it was the cause.  Has received 1st generation cephalosporin many times (04/24/2012, 07/27/2013, 09/28/2016, 02/20/2018, 07/11/2018, 08/10/2018 without documented ADRs.   Lovenox [Enoxaparin Sodium] Itching   Meperidine Other (See Comments)    Other Reaction: pt does not like how it makes her feel Other reaction(s): Other (See Comments) Other Reaction:  pt does not like how it makes her feel    History of Present Illness    SHERAL PFAHLER is a 85 y.o. female with PMH as above.  She has history of COPD on home oxygen, former tobacco use, stage III squamous cell lung cancer s/p chemoradiation 4765, diastolic heart failure, hypertension, paroxysmal atrial fibrillation and flutter on chronic anticoagulation and amiodarone therapy, coronary artery disease s/p CABG, PPM, mitral valve repair (2002, secondary to MR).  She quit smoking in 2001.  She has completed chemotherapy for carcinoma of the right lung.  She has a history of falls and chronic back pain from a collapsed vertebrae.  In 2017, she suffered a pelvic fracture after a fall.  She has history of recurrent atrial arrhythmia.  She underwent TEE/DCCV 2013.  She underwent ablation 07/27/2013.  She was scheduled with [facility] and underwent DCCV 12/18/2019.  She had recurrent  symptoms 01/13/2020 and subsequently seen again by [facility] 5/25.  She has been in atrial arrhythmia since 01/13/2020.  PPM was tracking atrial arrhythmia up to 130 bpm with mode switching.  Burst pacing unsuccessful in EP office but programming changes allowed device to switch to DDIR.    She was last seen by Dr. Gollan/20/2022.  At that time, she reported she was more active and had started stretching at home for her back.  She had chronic shortness of breath but typically use 3 L oxygen.  She was using Lasix daily in the morning.  She reported occasional abdominal distention but had not needed an extra Lasix.  She was using her nebulizers and Mucinex as needed.  EKG showed atrial flutter with ventricular rate 82 bpm.  Today, 06/23/2021, she returns to clinic with her daughter and does not feel well.  She reports extreme fatigue over the last few days.  She has shortness of breath and dyspnea.  She has frequent tachypalpitations.  She has noticed some weight gain and some swelling in her ankles, stomach, and upper extremities.  She has a cough but is not producing very much phlegm.  She notes dizziness in which she feels as if she herself is spinning.  She notes amaurosis fugax.  She recently had a fall and injured her arm but has not had a recurrent fall since that time.  Her oxygen saturations at home have been running between 85% to 83% when winded and when sedentary and breathing well 93% to 95%.  She is monitoring her blood pressure at home with an arm cuff and typical blood pressure around 130/170.  She reports SBP 10 3-1 30 and DBP 60-80.  She denies any signs or symptoms of bleeding.  She reports medication compliance, though she has not taken any medications yet today.  She has had difficulty drinking water lately, and can barely drink a sip.  She reports that she does not want to drink any water.  She recently bought some boost and wants to try drinking this going forward.  Her daughter notes that she  feels her mother appears more pale than usual.  At the time of her visit today, BP 90/60 with SPO2 96% and HR 63 bpm.  She has not yet taken any of her cardiac medications including her Toprol-XL, Lasix, and Entresto.  Home Medications   Current Outpatient Medications  Medication Instructions   acetaminophen (TYLENOL) 500-1,000 mg, Oral, Every 4 hours PRN   apixaban (ELIQUIS) 2.5 mg, Oral, 2 times daily   budesonide (PULMICORT) 0.5 MG/2ML nebulizer solution  INHALE 2 MILLILITERS BY NEBULIZER 2 TIMES DAILY   cetirizine (ZYRTEC) 10 MG tablet TAKE (1) TABLET BY MOUTH EVERY DAY   ENTRESTO 24-26 MG TAKE (1) TABLET BY MOUTH TWICE DAILY   fluticasone (FLONASE) 50 MCG/ACT nasal spray 2 sprays, Each Nare, Daily   furosemide (LASIX) 20 MG tablet TAKE 1 TABLET BY MOUTH TWICE DAILY, ALTERNATING WITH 1 EXTRA TABLET AS NEEDED FOR SWELLING.   ipratropium-albuterol (DUONEB) 0.5-2.5 (3) MG/3ML SOLN TAKE 3 MLS BY NEBULIZATION EVERY 6 HOURSAS NEEDED FOR WHEEZING OR SHORTNESS OF BREATH   ketorolac (ACULAR) 0.5 % ophthalmic solution 1 drop, Right Eye, 3 times daily   levothyroxine (SYNTHROID) 88 MCG tablet TAKE (1) TABLET BY MOUTH EVERY DAY BEFORE BREAKFAST   metoprolol succinate (TOPROL-XL) 50 mg, Oral, 2 times daily   Mucinex 600 mg, Oral, 2 times daily PRN   Multiple Vitamins-Minerals (CENTRUM SILVER PO) 1 tablet, Oral, Daily   oxyCODONE (OXY IR/ROXICODONE) 5 mg, Oral, Every 4 hours PRN   OXYGEN 3 L, Inhalation   rosuvastatin (CRESTOR) 5 MG tablet TAKE ONE (1) TABLET BY MOUTH ONCE DAILY   vitamin B-12 (CYANOCOBALAMIN) 1,000 mcg, Oral, Daily   Vitamins A & D (VITAMIN A & D) ointment 1 application, Topical, As needed   YUPELRI 175 MCG/3ML nebulizer solution Use one vial in nebulizer once daily. Do not mix with other nebulized medications.     Review of Systems    She reports fatigue, dizziness, dyspnea, shortness of breath, cough, upper and lower extremity edema, abdominal distention, tachypalpitations.   She notes amaurosis fugax.  She reports a recent fall.  She denies chest pain, pnd, orthopnea, n, v, or early satiety.   All other systems reviewed and are otherwise negative except as noted above.  Physical Exam    VS:  BP 90/60 (BP Location: Left Arm, Patient Position: Sitting, Cuff Size: Normal)   Pulse 63   Ht _0  (1.676 m)   Wt 148 lb (67.1 kg)   SpO2 96%   BMI 23.89 kg/m  , BMI Body mass index is 23.89 kg/m. GEN: Pale appearing female, arm in sling.  Joined by her daughter. HEENT: normal.   Neck: Supple, JVD difficult to assess due to position of patient.  O2 in place. No carotid bruits, or masses. Cardiac: RRR, 1/6 systolic murmur, rubs, or gallops. No clubbing, cyanosis.  2+ bilateral lower extremity edema.  Radials/DP/PT 2+ and equal bilaterally.  Respiratory: Bibasilar crackles present. GI: Soft, nontender, nondistended, BS + x 4. MS: Right upper extremity in a sling after her recent displaced fracture of the upper right humerus Skin: warm and dry, no rash. Neuro:  Strength and sensation are intact. Psych: Normal affect.  Accessory Clinical Findings    ECG personally reviewed by me today -ventricular paced rhythm, 63 bpm, QTC 530 ms, underlying atrial rhythm versus artifact- no acute changes.  VITALS Reviewed today   Temp Readings from Last 3 Encounters:  06/03/21 (!) 97.4 F (36.3 C) (Oral)  05/19/21 97.8 F (36.6 C)  02/17/21 98.1 F (36.7 C) (Oral)   BP Readings from Last 3 Encounters:  06/23/21 90/60  06/03/21 114/60  05/28/21 (!) 101/56   Pulse Readings from Last 3 Encounters:  06/23/21 63  06/03/21 89  05/28/21 86    Wt Readings from Last 3 Encounters:  06/23/21 148 lb (67.1 kg)  06/02/21 150 lb (68 kg)  05/19/21 149 lb 14.6 oz (68 kg)     LABS  reviewed today   Lab Results  Component Value Date   WBC 8.0 06/03/2021   HGB 10.3 (L) 06/03/2021   HCT 31.4 (L) 06/03/2021   MCV 95.7 06/03/2021   PLT 293 06/03/2021   Lab Results  Component  Value Date   CREATININE 0.87 06/03/2021   BUN 13 06/03/2021   NA 137 06/03/2021   K 4.0 06/03/2021   CL 104 06/03/2021   CO2 26 06/03/2021   Lab Results  Component Value Date   ALT 13 05/28/2021   AST 21 05/28/2021   ALKPHOS 102 05/28/2021   BILITOT 0.9 05/28/2021   Lab Results  Component Value Date   CHOL 131 08/25/2017   HDL 36 (L) 08/25/2017   LDLCALC 63 08/25/2017   TRIG 159 (H) 08/25/2017   CHOLHDL 3.6 08/25/2017    Lab Results  Component Value Date   HGBA1C 5.5 09/28/2019   Lab Results  Component Value Date   TSH 10.04 (A) 01/22/2020     STUDIES/PROCEDURES reviewed today   Echocardiogram 03/06/2020  1. Left ventricular ejection fraction, by estimation, is 50 to 55%. The  left ventricle has low normal function. The left ventricle has no regional  wall motion abnormalities. Left ventricular diastolic parameters are  indeterminate.   2. Right ventricular systolic function is normal. The right ventricular  size is normal. There is moderately elevated pulmonary artery systolic  pressure. The estimated right ventricular systolic pressure is 37.8 mmHg.   3. Mild mitral valve regurgitation.   4. Aortic valve regurgitation is mild.   LHC  04/28/15 Prox LAD-2 lesion, 100% stenosed. Prox LAD-1 lesion, 30% stenosed. Ost Cx to Prox Cx lesion, 60% stenosed. Mid Cx lesion, 99% stenosed. The lesion was previously treated with a stent (unknown type) greater than two years ago. Ost 2nd Mrg to 2nd Mrg lesion, 100% stenosed. SVG was injected . Origin lesion, 100% stenosed. Prox RCA to Mid RCA lesion, 90% stenosed. The lesion was previously treated with a stent (unknown type) greater than two years ago. Mid RCA to Dist RCA lesion, 100% stenosed. The lesion was previously treated with a stent (unknown type) . SVG was injected . The graft exhibits minimal luminal irregularities. The left ventricular systolic function is normal. 1. Severe underlying three-vessel coronary artery  disease with patent SVG to LAD. Occluded SVG likely to OM. The native RCA has severe in-stent restenosis in the midsegment and is occluded distally at the sites of previously placed stents. There are left to right collaterals. 2. Moderately reduced LV systolic function with an ejection fraction of 35-40%. 3. Mildly dilated left ventricular end-diastolic pressure. Recommendations: There are no good options for revascularization. The native RCA is chronically occluded with left-to-right collaterals. The native left circumflex has diffuse disease and overall small in caliber. I recommend optimizing medical therapy for CAD and ischemic cardiomyopathy. I discontinued aspirin given that the patient is on anticoagulation. Wall Motion              Left Heart  Left Ventricle The left ventricular size is normal.  The left ventricular systolic function is normal.  The ejection fraction could not be assess due to 35-40.  There are wall motion abnormalities in the left ventricle.   There are segmental wall motion abnormalities in the left ventricle.  Aortic Valve There is no aortic valve stenosis, and mild (2+) aortic regurgitation.   Coronary Diagrams  Diagnostic Dominance: Right Intervention  Monitor 03/2015 Monitor showed 50% Afib burden with peak rate of 116, mostly rate controlled. She would likely do better  with decreased Afib burden. Would ideally like to attempt to titrate up her metoprolol first, if BP allows. If BP does not allow for this then may have to use amiodarone. First try to go up on the Toprol to 50 mg bid. If this drops her BP too low then we would likely need to go back down to 25 mg bid and start amiodarone for rhythm control. I also see that she has asked for a refill of her Toprol, please send this in. Follow up as planned.  MPI 03/2015 T wave inversion was noted during stress in the II, III and V4 leads. Defect 1: There is a large defect of severe severity present in the  basal inferolateral and mid inferolateral location. Findings consistent with prior myocardial infarction with peri-infarct ischemia. This is a high risk study. The left ventricular ejection fraction is severely decreased (<30%).    Assessment & Plan    Acute on chronic HFmrEF Pulmonary hypertension Dilated cardiomyopathy --Reports symptoms of worsening heart failure/heart failure exacerbation as above in HPI.  Suspect that her shortness of breath is likely multifactorial in the setting of her known lung disease; however, she does appear signficantly volume up today.  Given her low BP, recommend checking a BMET/BNP before escalation of diuresis.  She does appear very volume up today, but her hypotension complicates escalation of diuresis at this time.  We will have her hold all of her antihypertensive medications today in order to ensure she has room for escalation of diuresis if renal function electrolytes allow.  Previous echo as above with EF 50 to 55%, NR WMA, RVSP 49.9 mmHg, and recommendation to update echo today given her symptoms of overload as above. Addendum: Labs show significant hypokalemia.  In the setting of her hypokalemia and low BP, recommend she proceed directly to the ED for IV diuresis with close monitoring of her electrolytes, vitals, and renal function.  She should continue to hold her Toprol, oral Lasix, and Entresto for now.  Paroxysmal atrial fibrillation/flutter --Reports tachypalpitations and dizziness.  Followed by [facility].  Ventricular rate well controlled today with patient not currently on any of her rate controlling medication.  Given her hypotension and well-controlled ventricular rates off of her current medications, recommend holding Toprol 50 mg twice daily today and pending labs.  We will check a BMET to ensure stable renal function electrolytes at goal.  We will also check a CBC.  She denies any signs or symptoms of bleeding, yet she appears very pale on exam.  We  will ensure blood counts are stable.  For now, continue Eliquis 2.5 mg twice daily. Addendum: Labs show stable H&H, but she has been advised to go to the ED given significant hypokalemia and soft BP with need for IV diuresis due to her volume overload.  Presyncope --Reports dizziness in which she feels as if she is spinning.  She has recently suffered a fall that fractured her right arm.  Given her low BP today, advised to hold her Toprol, Lasix, and Entresto.  We will collect BMET and CBC, as well as BNP.  Check carotids.    Coronary artery disease s/p CABG --She denies chest pain but reports dyspnea and shortness of breath.  Previous 2016 catheterization as above with severe three-vessel CAD with patent SVG to LAD and occluded SVG likely to OM.  Native RCA with severe mid segment in-stent restenosis and occluded distally at the sites of previous placed stents with left to right collaterals.  Recommendation at that time was for optimization of medical therapy.  Continue Eliquis and lieu of ASA.  She is holding Toprol due to her soft BP.  Continue Crestor 5 mg daily.  Essential hypertension --Currently hypotensive with recommendation to hold Toprol, Entresto, and Lasix at this time.  Further recommendations pending labs as above.  Chronic obstructive pulmonary disease --Recommend follow-up with pulmonologist.  Continue home oxygen.  Anemia --Appears pale on exam.  Denies any signs or symptoms of bleeding.  Will check CBC on anticoagulation with further recommendations at that time if indicated.  Medication changes: Hold Toprol, Entresto, Lasix for now.  Further recommendations pending labs. Labs ordered: BMET, CBC, BNP Studies / Imaging ordered: Carotids, echo Future considerations: Pending labs Disposition: RTC 1 to 2 weeks  Addendum: Due to significant hypokalemia and hypotension with need for diuresis, sent to the emergency department.  *Please be aware that the above documentation was  completed voice recognition software and may contain dictation errors.     Arvil Chaco, PA-C 06/23/2021

## 2021-06-23 NOTE — ED Notes (Signed)
Discussing amount of carrier fluid to dilute IV potassium with as pt is fluid overloaded. With hospitalist.

## 2021-06-23 NOTE — ED Notes (Signed)
Messaged attending for carrier fluid for IV potassium. ED pyxis out of PO potassium. Called pharmacy to send.

## 2021-06-23 NOTE — ED Notes (Signed)
DO at bedside care plan updated with pt and family. NADN

## 2021-06-23 NOTE — H&P (Signed)
Abdomen History and Physical   [name] XKG:[id number] DOB: 1936/07/21 DOA: 06/23/2021  PCP: Glean Hess, MD  Outpatient Specialists: Dr. Rockey Situ, [facility] cardiology Patient coming from: Home  I have personally briefly reviewed patient's old medical records in [place].  Chief Concern: Abnormal lab and was advised by cardiology office to come to the emergency department  HPI: Jenny Cooper is a 85 y.o. female with medical history significant for paroxysmal atrial fibrillation, history of squamous cell lung carcinoma stage III, status post chemoradiation therapy in 2016, on chronic anticoagulation and amiodarone therapy, CAD status post CABG in 2002, former tobacco use, COPD, history of left lower extremity DVT, hyperlipidemia, hypertension, hypothyroid, ischemic cardiomyopathy, mitral regurgitation, neuropathy,  At bedside, she is able to tell me her name, age, current location, and the current year of 2022.   Her chief concern was weakness. She reports this has been going on for about 1.5 weeks and it is progressively worsening. She went to her cardiologist and labs were done, ekg ordered, bilateral carotid US, and complete echo ordered. She denies syncope or lost of consciousness. She endorses decreased urination in the last few days.   She was told to come to the ED for further evaluation due to the low potassium.   She denies chest pain. She reports she has been worsening shortness of breath for about 2.5 weeks. She denies known sick contacts.   She endorses swelling in her legs that started about 1 week ago. She does not weigh herself but endorses that she feels she has gain weight.   Approximate daily intake over the last week: -Water: 10 oz per day -Juice: none -Milk: none -Coffee: 6 oz -Tea: none -Etoh: none -Soda: none currently -Soup: 1/2 cup on 06/22/21 only  She denies fever, nausea, vomiting, dysuria, diarrhea. She reports the shortness of breath is  worse with exertion. She reports no difficulty with laying flat.   Social history: She lives with one  of her sons. She is a former tobacco user, at her peak, she smoked 1 ppd. She quit in 2001. She denies etoh and recreational drug use. 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   Vaccination history: She is vaccinated for covid 19, three doses of Moderna.   ROS: Constitutional: no weight change, no fever ENT/Mouth: no sore throat, no rhinorrhea Eyes: no eye pain, no vision changes Cardiovascular: no chest pain, + dyspnea,  no edema, no palpitations Respiratory: no cough, no sputum, no wheezing Gastrointestinal: no nausea, no vomiting, no diarrhea, no constipation Genitourinary: no urinary incontinence, no dysuria, no hematuria Musculoskeletal: no arthralgias, no myalgias Skin: no skin lesions, no pruritus, Neuro: + weakness, no loss of consciousness, no syncope Psych: no anxiety, no depression, + decrease appetite Heme/Lymph: no bruising, no bleeding  ED Course: Discussed with emergency medicine provider, patient requiring hospitalization for chief concerns of heart failure exacerbation and hypokalemia.  Vitals in the emergency department was remarkable for temperature of 97.7, respiration rate of 24 and improved to 22, heart rate of 89 and improved to 81, initial blood pressure 91/58 and improved to 117/61, SPO2 of 88% on 6 L nasal cannula and improved to 98% on room air.  COVID/influenza A/influenza B PCR negative.  Labs in the emergency department was remarkable for sodium 137, potassium 2.8, chloride 100, bicarb 26, BUN of 17, serum creatinine of 1.16, nonfasting blood glucose 124, GFR 47, hemoglobin 11, platelets  230.  High sensitive troponin is 26.  BNP was elevated at 2537.6.  In the emergency department IV Lasix 40 mg, potassium 40 mill equivalent p.o. packet was ordered by EDP.  Assessment/Plan  Principal  Problem:   Hypokalemia Active Problems:   Essential hypertension   Hypothyroidism (acquired)   Mitral regurgitation   HLD (hyperlipidemia)   Chronic obstructive pulmonary disease (HCC)   Hx of adenomatous colonic polyps   Atrial flutter (HCC)   PAF (paroxysmal atrial fibrillation) (HCC)   Sinus node dysfunction (HCC)   Recurrent pleural effusion on right   Pulmonary hypertension, unspecified (HCC)   Chronic respiratory failure with hypoxia (HCC)   Squamous cell lung cancer (HCC)   Status post reverse arthroplasty of shoulder, right   Acute exacerbation of CHF (congestive heart failure) (HCC)   Troponin level elevated   # Severe hypokalemia -presumed secondary to acute kidney injury in setting of cardiorenal - Discontinue IV Lasix at this time - Check magnesium stat, added to prior collection, called lab - Modified IV potassium 10 mill equivalent every hour, for 3 doses ordered stat - Potassium chloride tablet 40 mill equivalent once - Potassium packet 40 mill equivalent twice daily - Repeat BMP scheduled for 1 AM on 06/24/2021  # Shortness of breath-etiology work-up in progress - Immediate IV furosemide has been deferred at this time due to severe hypokalemia - Check procalcitonin - Presumed secondary to heart failure exacerbation-scheduled Lasix for 2 AM on 06/24/2021 - Complete echo ordered - A.m. team to consult cardiology in the a.m. for heart failure medication optimization  # Acute kidney injury-presumed secondary to cardiorenal - Treat as above - BMP in the a.m.  # Elevated troponin-suspect secondary to fluid overload, treat as above - 1 additional time and high sensitive troponin for 06/24/2021 at 5 AM ordered - Low clinical suspicion for ACS at this time as patient is not presenting with any chest pain and there is no ST-T wave ischemic changes on EKG  # CAD status post CABG in 2002 - SVG-LAD, SVG OM - Resumed metoprolol succinate 50 mg per instructions for  06/24/2021 - Delene Loll is being held at this time due to acute kidney injury  # Squamous cell carcinoma of the right lung-stage IIIa - Status post concurrent chemotherapy and radiation in 2016 with carboplatin and paclitaxel  # Status post pacemaker placement in 2013  # Hyperlipidemia-rosuvastatin 5 mg nightly resumed  # Hypertension-metoprolol succinate resumed  # Hypothyroid-levothyroxine 88 mcg oral resumed for 06/24/2021  Chart reviewed.   DVT prophylaxis: Eliquis resumed Code Status: Full code Diet: Heart healthy Family Communication: Asencion Partridge, daughter at bedside was update   Disposition Plan: Pending clinical course Consults called: None at this time Admission status: Observation, telemetry, MedSurg  Past Medical History:  Diagnosis Date   Acute midline low back pain without sciatica 08/13/2016   Acute on chronic respiratory failure with hypoxia ([place]) 08/21/2018   Acute respiratory failure with hypoxia ([place]) 12/11/2017   Allergy    Aortic atherosclerosis ([place])    Atypical atrial flutter ([place])    a.) s/p ablation 07/27/2013 and 05/10/2016   Balance problem    CAD (coronary artery disease)    a. s/p MI x 2 in 2002 s/p PCI x 2 in 2002; b. s/p 2v CABG 2002; c. stress echo 07/2004 w/ evi of pos & inf infarct & no evi of ischemia; d. 4/08 dipyridamole scan w/ multiple areas of infarct, no ischemia, EF 49%; e. cath 04/28/15 3v CAD, med Rx  rec, no targets for revasc, LM lum irregs, pLAD 30%, 100%, ost-pLCx 60%, [name] 99%, OM2 100%, p-mRCA 90%, [name] 100% L-R collats, VG-mLAD irregs, VG-OM2 oc   Chronic anticoagulation    a.) Apixaban   Chronic systolic CHF (congestive heart failure) ([place])    a. echo 03/2015: EF 30-35%, sev ant/inf/pos HK, in mild to mod MR   Complication of anesthesia    a.) postoperative apnea. b.) postoperative hypoxia.   Compressed spine fracture ([place]) 08/06/2016   a.) L2, T11, T12   COPD (chronic obstructive pulmonary disease) (HCC)    Deep vein thrombosis  (DVT) of left lower extremity ([place]) 12/2001   Fracture of multiple pubic rami, right, closed, initial encounter ([place]) 05/27/2016   a.) RIGHT superior and inferior pubic rami fractures s/p mechanical fall.   GERD (gastroesophageal reflux disease)    HLD (hyperlipidemia)    HTN (hypertension)    Hypothyroidism    Ischemic cardiomyopathy    Mitral regurgitation    a. s/p mitral ring placement 09/2000; b. echo 09/2010: EF 50%, inf HK, post AK, mild MR, prosthetic mitral valve ring w/ peak gradient of 10 mmHg; b. echo 2/13: EF 50%, mild MR/TR      Myocardial infarction ([place]) 2002   a.) x 2 in 2002; PCI with stents (unknown type) placed to the p-mRCA, [name], and [name].   Neuropathy    Osteoarthritis    Osteoporosis    Oxygen dependent    a.) 3 L/[place]   Pacemaker    a. MDT 2002; b. generator replacement 2013; c. followed by Dr. Omelia Blackwater, MD   PAF (paroxysmal atrial fibrillation) [facility])    a.) s/p DCCV on 04/08/2017. b.) on apixaban   Personal history of chemotherapy    Personal history of radiation therapy    Pneumonia    S/P CABG x 2 2002   a.) SVG-LAD, SVG-OM   Squamous cell carcinoma of right lung ([facility]) 01/03/2015   a.) stage IIIa. b.) s/p concurrent chemotherapy (carboplatin + paclitaxel) and XRT (IMRT 6000 cGy)   SVT (supraventricular tachycardia) (HCC)    Type II diabetes mellitus with complication ([place]) 19/62/2297   Past Surgical History:  Procedure Laterality Date   APPENDECTOMY     CARDIAC CATHETERIZATION N/A 04/28/2015   Procedure: Left Heart Cath and Coronary Angiography;  Surgeon: Wellington Hampshire, MD;  Location: [place] [facility];  Service: Cardiovascular;  Laterality: N/A;   CARDIAC ELECTROPHYSIOLOGY STUDY AND ABLATION N/A 05/10/2016   Procedure(s): INTRACARDIAC ELECTROPHYSIOLOGIC 3D MAPPING, STIMULATION PACING HEART, ICAR CATHETER ABLATION ARRHYTHMIA ADD ON, ABLATE L/R ATRIAL FIBRIL W/ISOLATED PULM VEIN, INTRACARD ECHO, THER/DX INTERVENT; Location: [facility]; Surgeon: Norm Salt, MD   CARDIOVERSION N/A 04/08/2017   Procedure: CARDIOVERSION;  Surgeon: Wellington Hampshire, MD;  Location: [facility];  Service: Cardiovascular;  Laterality: N/A;   CHEST TUBE INSERTION N/A 07/11/2018   Procedure: [id number] CATH INSERTION;  Surgeon: [name], MD;  Location: [facility];  Service: Thoracic;  Laterality: N/A;   COLONOSCOPY  12/2009   2 small tubular adenomas   CORONARY ANGIOPLASTY WITH STENT PLACEMENT Left 2002   s/p MI x 2 with subsequent PCI x 2 ; stents (unknown type) placed to the p-mRCA, [name], and [name].   CORONARY ARTERY BYPASS GRAFT  09/2000   DRAIN REMOVAL Right 08/10/2018   Procedure: DRAIN REMOVAL;  Surgeon: [name], MD;  Location: [facility];  Service: General;  Laterality: Right;   ECTOPIC PREGNANCY SURGERY     ELECTROMAGNETIC NAVIGATION BROCHOSCOPY N/A 10/06/2015  Procedure: ELECTROMAGNETIC NAVIGATION BRONCHOSCOPY;  Surgeon: Flora Lipps, MD;  Location: [facility];  Service: Cardiopulmonary;  Laterality: N/A;   ENDOBRONCHIAL ULTRASOUND N/A 10/06/2015   Procedure: ENDOBRONCHIAL ULTRASOUND;  Surgeon: Flora Lipps, MD;  Location: [facility];  Service: Cardiopulmonary;  Laterality: N/A;   EYE SURGERY     FLEXIBLE BRONCHOSCOPY Right 07/11/2018   Procedure: FLEXIBLE BRONCHOSCOPY;  Surgeon: [name], MD;  Location: [facility];  Service: Thoracic;  Laterality: Right;   KYPHOPLASTY N/A 09/28/2016   Procedure: KYPHOPLASTY;  Surgeon: Hessie Knows, MD;  Location: [facility];  Service: Orthopedics;  Laterality: N/A;   KYPHOPLASTY N/A 02/20/2018   Procedure: [id number];  Surgeon: Hessie Knows, MD;  Location: [facility];  Service: Orthopedics;  Laterality: N/A;   PACEMAKER INSERTION  03/2012   REVERSE SHOULDER ARTHROPLASTY Right 06/02/2021   Procedure: REVERSE SHOULDER ARTHROPLASTY;  Surgeon: Corky Mull, MD;  Location: [facility];  Service: Orthopedics;  Laterality: Right;   SVT ABLATION N/A 07/27/2013   Procedure: SVT ABLATION; Location: [facility]; Surgeon: Rolla Etienne, MD   thorocentesis  12/24/2016   VAGINAL HYSTERECTOMY     partial - left ovary remains   VIDEO ASSISTED THORACOSCOPY Right 07/11/2018   Procedure: VIDEO ASSISTED THORACOSCOPY;  Surgeon: [name], MD;  Location: [facility];  Service: Thoracic;  Laterality: Right;   VIDEO ASSISTED THORACOSCOPY (VATS) W/TALC PLEUADESIS Right 07/11/2018   Procedure: THORACOTOMY PLEURAL BIOPSY WITH TALC PLEURODESIS;  Surgeon: [name], MD;  Location: [facility];  Service: Thoracic;  Laterality: Right;  pleural biopsy   Social History:  reports that she quit smoking about 21 years ago. Her smoking use included cigarettes. She has a 40.00 pack-year smoking history. She has never used smokeless tobacco. She reports current alcohol use of about 10.0 standard drinks per week. She reports that she does not use drugs.  Allergies  Allergen Reactions   Cefdinir Rash    Had hives after completing treatment - unsure if it was the cause.  Has received 1st generation cephalosporin many times (04/24/2012, 07/27/2013, 09/28/2016, 02/20/2018, 07/11/2018, 08/10/2018 without documented ADRs.   Lovenox [Enoxaparin Sodium] Itching   Meperidine Other (See Comments)    Other Reaction: pt does not like how it makes her feel Other reaction(s): Other (See Comments) Other Reaction: pt does not like how it makes her feel   Family History  Problem Relation Age of Onset   COPD Mother        sister, and brother   Lung disease Father    Stroke Maternal Grandmother    Hypertension Sister    COPD Sister    Hypertension Brother    COPD Brother    Colon cancer Brother    Heart attack Neg Hx    Breast cancer Neg Hx    Family history: Family history reviewed and pertinent for hypertension in sister and brother.  Prior to Admission medications   Medication Sig Start Date End Date Taking? Authorizing Provider  acetaminophen (TYLENOL) 500 MG tablet Take 500-1,000 mg by mouth every 4 (four) hours as needed for mild pain  or moderate pain.    [provider]  apixaban (ELIQUIS) 2.5 MG TABS tablet Take 1 tablet (2.5 mg total) by mouth 2 (two) times daily. 02/03/21   Kate Sable, MD  budesonide (PULMICORT) 0.5 MG/2ML nebulizer solution INHALE 2 MILLILITERS BY NEBULIZER 2 TIMES DAILY 06/27/20   Flora Lipps, MD  cetirizine (ZYRTEC) 10 MG tablet TAKE (1) TABLET BY MOUTH EVERY DAY 06/15/18   Glean Hess, MD  ENTRESTO 24-26 MG TAKE (1) TABLET BY MOUTH TWICE DAILY 01/27/21   Minna Merritts, MD  fluticasone (FLONASE) 50 MCG/ACT nasal spray Place 2 sprays into both nostrils daily. Patient taking differently: Place 1 spray into both nostrils daily as needed for allergies. 01/23/20   Glean Hess, MD  furosemide (LASIX) 20 MG tablet TAKE 1 TABLET BY MOUTH TWICE DAILY, ALTERNATING WITH 1 EXTRA TABLET AS NEEDED FOR SWELLING. Patient taking differently: Take 20 mg by mouth See admin instructions. Take 20 mg daily, may take a second 20 mg dose as needed for swelling 05/15/21   Minna Merritts, MD  guaiFENesin (MUCINEX) 600 MG 12 hr tablet Take 1 tablet (600 mg total) by mouth 2 (two) times daily as needed. 02/11/20   Martyn Ehrich, NP  ipratropium-albuterol (DUONEB) 0.5-2.5 (3) MG/3ML SOLN TAKE 3 MLS BY NEBULIZATION EVERY 6 HOURSAS NEEDED FOR WHEEZING OR SHORTNESS OF BREATH 06/18/21   Flora Lipps, MD  ketorolac (ACULAR) 0.5 % ophthalmic solution Place 1 drop into the right eye in the morning, at noon, and at bedtime. 05/05/21   [provider]  levothyroxine (SYNTHROID) 88 MCG tablet TAKE (1) TABLET BY MOUTH EVERY DAY BEFORE BREAKFAST 01/08/21   Glean Hess, MD  metoprolol succinate (TOPROL-XL) 25 MG 24 hr tablet Take 2 tablets (50 mg total) by mouth in the morning and at bedtime. 02/12/21   Minna Merritts, MD  Multiple Vitamins-Minerals (CENTRUM SILVER PO) Take 1 tablet by mouth daily.    [provider]  oxyCODONE (OXY IR/ROXICODONE) 5 MG immediate release tablet Take 1 tablet  (5 mg total) by mouth every 4 (four) hours as needed for moderate pain. 06/03/21   Lattie Corns, PA-C  OXYGEN Inhale 3 L into the lungs.     [provider]  rosuvastatin (CRESTOR) 5 MG tablet TAKE ONE (1) TABLET BY MOUTH ONCE DAILY Patient taking differently: Take 5 mg by mouth at bedtime. 03/30/21   Minna Merritts, MD  vitamin B-12 (CYANOCOBALAMIN) 1000 MCG tablet Take 1,000 mcg by mouth daily.    [provider]  Vitamins A & D (VITAMIN A & D) ointment Apply 1 application topically as needed (irritation).    [provider]  YUPELRI 175 MCG/3ML nebulizer solution Use one vial in nebulizer once daily. Do not mix with other nebulized medications. 04/23/21   Flora Lipps, MD   Physical Exam: Vitals:   06/23/21 1659 06/23/21 1730 06/23/21 1800 06/23/21 1830  BP: 117/61 121/61 (!) 116/56 122/70  Pulse: 90 83 81 83  Resp: 18 16 (!) 22 18  Temp: 98.2 F (36.8 C)     TempSrc: Oral     SpO2: 93% 98% 98% 99%   Constitutional: appears age-appropriate, frail, NAD, calm, comfortable Eyes: PERRL, lids and conjunctivae normal ENMT: Mucous membranes are moist. Posterior pharynx clear of any exudate or lesions. Age-appropriate dentition. Hearing appropriate Neck: normal, supple, no masses, no thyromegaly Respiratory: clear to auscultation bilaterally, no wheezing.  Bilateral lower lobe crackles.  Mild increased respiratory effort. No accessory muscle use.  Patient is on nasal cannula supplementation, approximately 5 to 6 L. Cardiovascular: Regular rate and rhythm, no murmurs / rubs / gallops.  Bilateral lower extremity pitting edema, approximately 2+, 2+ pedal pulses. No carotid bruits.  Abdomen: Obese abdomen, no tenderness, no masses palpated, no hepatosplenomegaly. Bowel sounds positive.  Musculoskeletal: no clubbing / cyanosis. No joint deformity upper and lower extremities. Good ROM, no contractures, no atrophy. Normal muscle tone.  Skin: no rashes, lesions, ulcers.  No induration Neurologic: Sensation intact. Strength 5/5 in all 4.  Psychiatric: Normal judgment and insight. Alert and oriented x 3. Normal mood.   EKG: independently reviewed, showing sinus rhythm with rate of 82, QTc 43  Chest x-ray on Admission: I personally reviewed and I agree with radiologist reading as below.  DG Chest 2 View  Result Date: 06/23/2021 CLINICAL DATA:  Shortness of breath. EXAM: CHEST - 2 VIEW COMPARISON:  May 19, 2021. FINDINGS: Stable cardiomediastinal silhouette. Status post cardiac valve repair. Right internal jugular Port-A-Cath is unchanged in position. Left-sided pacemaker is unchanged in position. Status post right shoulder arthroplasty. Stable bibasilar atelectasis or scarring is noted with small pleural effusions. Stable right perihilar opacity is noted most consistent with scarring or postoperative change. IMPRESSION: Stable bilateral lung opacities are noted compared to prior exam. Interval placement of right total shoulder arthroplasty. Electronically Signed   By: Marijo Conception M.D.   On: 06/23/2021 16:16    Labs on Admission: I have personally reviewed following labs  CBC: Recent Labs  Lab 06/23/21 1103 06/23/21 1459  WBC 6.9 6.4  NEUTROABS  --  4.5  HGB 11.5* 11.0*  HCT 36.2 34.5*  MCV 98.1 97.2  PLT 223 875   Basic Metabolic Panel: Recent Labs  Lab 06/23/21 1103 06/23/21 1459  NA 138 137  K 2.9* 2.8*  CL 99 100  CO2 28 26  GLUCOSE 109* 124*  BUN 16 17  CREATININE 1.12* 1.16*  CALCIUM 8.8* 8.8*  MG  --  1.7   GFR: Estimated Creatinine Clearance: 34.4 mL/min (A) (by C-G formula based on SCr of 1.16 mg/dL (H)).  Liver Function Tests: Recent Labs  Lab 06/23/21 1459  AST 22  ALT 8  ALKPHOS 82  BILITOT 1.5*  PROT 7.7  ALBUMIN 3.4*   Urine analysis:    Component Value Date/Time   COLORURINE YELLOW 05/28/2021 0931   APPEARANCEUR CLEAR 05/28/2021 0931   LABSPEC 1.015 05/28/2021 0931   PHURINE 5.5 05/28/2021 0931    GLUCOSEU NEGATIVE 05/28/2021 0931   HGBUR NEGATIVE 05/28/2021 [place] 05/28/2021 0931   BILIRUBINUR neg 11/21/2020 1351   KETONESUR NEGATIVE 05/28/2021 0931   PROTEINUR NEGATIVE 05/28/2021 0931   UROBILINOGEN 0.2 11/21/2020 1351   NITRITE NEGATIVE 05/28/2021 0931   LEUKOCYTESUR SMALL (A) 05/28/2021 0931   Dr. Tobie Poet [facility]  If 7PM-7AM, please contact overnight-coverage provider If 7AM-7PM, please contact day coverage provider [email or web address]  06/23/2021, 8:08 PM

## 2021-06-23 NOTE — ED Notes (Signed)
Dr Cheri Fowler at bedside. Potassium is 2.8 and pt has BNP of 2537 and is wearing 6L oxygen and normally wears 3L. Pt is NSR on monitor at this moment.

## 2021-06-23 NOTE — ED Provider Notes (Signed)
[facility] Emergency Department Provider Note   ____________________________________________   Event Date/Time   First MD Initiated Contact with Patient 06/23/21 1723     (approximate)  I have reviewed the triage vital signs and the nursing notes.   HISTORY  Chief Complaint No chief complaint on file.    HPI Jenny Cooper is a 85 y.o. female who presents for shortness of breath  LOCATION: Chest DURATION: 3 days prior to arrival TIMING: Worsening since onset SEVERITY: Severe QUALITY: Shortness of breath CONTEXT: Patient states she has had worsening shortness of breath over the last 3 days and has had to increase her oxygen at home to the point where her O2 tank is empty.  Patient states she is normally on 3 L but has had to increase it up to 5-6 over the last few days MODIFYING FACTORS: Exertion worsens the shortness of breath and is partially relieved with rest and supplemental oxygenation ASSOCIATED SYMPTOMS: Generalized weakness, PND   Per medical record review, patient has history of CHF, CAD, and chronic hypoxic respiratory failure on 3 L nasal cannula chronically          Past Medical History:  Diagnosis Date   Acute midline low back pain without sciatica 08/13/2016   Acute on chronic respiratory failure with hypoxia ([place]) 08/21/2018   Acute respiratory failure with hypoxia ([place]) 12/11/2017   Allergy    Aortic atherosclerosis ([place])    Atypical atrial flutter ([place])    a.) s/p ablation 07/27/2013 and 05/10/2016   Balance problem    CAD (coronary artery disease)    a. s/p MI x 2 in 2002 s/p PCI x 2 in 2002; b. s/p 2v CABG 2002; c. stress echo 07/2004 w/ evi of pos & inf infarct & no evi of ischemia; d. 4/08 dipyridamole scan w/ multiple areas of infarct, no ischemia, EF 49%; e. cath 04/28/15 3v CAD, med Rx rec, no targets for revasc, LM lum irregs, pLAD 30%, 100%, ost-pLCx 60%, [name] 99%, OM2 100%, p-mRCA 90%, [name] 100% L-R collats,  VG-mLAD irregs, VG-OM2 oc   Chronic anticoagulation    a.) Apixaban   Chronic systolic CHF (congestive heart failure) ([place])    a. echo 03/2015: EF 30-35%, sev ant/inf/pos HK, in mild to mod MR   Complication of anesthesia    a.) postoperative apnea. b.) postoperative hypoxia.   Compressed spine fracture ([place]) 08/06/2016   a.) L2, T11, T12   COPD (chronic obstructive pulmonary disease) (HCC)    Deep vein thrombosis (DVT) of left lower extremity ([place]) 12/2001   Fracture of multiple pubic rami, right, closed, initial encounter ([place]) 05/27/2016   a.) RIGHT superior and inferior pubic rami fractures s/p mechanical fall.   GERD (gastroesophageal reflux disease)    HLD (hyperlipidemia)    HTN (hypertension)    Hypothyroidism    Ischemic cardiomyopathy    Mitral regurgitation    a. s/p mitral ring placement 09/2000; b. echo 09/2010: EF 50%, inf HK, post AK, mild MR, prosthetic mitral valve ring w/ peak gradient of 10 mmHg; b. echo 2/13: EF 50%, mild MR/TR      Myocardial infarction ([place]) 2002   a.) x 2 in 2002; PCI with stents (unknown type) placed to the p-mRCA, [name], and [name].   Neuropathy    Osteoarthritis    Osteoporosis    Oxygen dependent    a.) 3 L/[place]   Pacemaker    a. MDT 2002; b. generator replacement 2013; c. followed by Dr.  Omelia Blackwater, MD   PAF (paroxysmal atrial fibrillation) [facility])    a.) s/p DCCV on 04/08/2017. b.) on apixaban   Personal history of chemotherapy    Personal history of radiation therapy    Pneumonia    S/P CABG x 2 2002   a.) SVG-LAD, SVG-OM   Squamous cell carcinoma of right lung ([place]) 01/03/2015   a.) stage IIIa. b.) s/p concurrent chemotherapy (carboplatin + paclitaxel) and XRT (IMRT 6000 cGy)   SVT (supraventricular tachycardia) (HCC)    Type II diabetes mellitus with complication ([place]) 19/14/7829    Patient Active Problem List   Diagnosis Date Noted   Acute exacerbation of CHF (congestive heart failure) ([place]) 06/23/2021   Hypokalemia 06/23/2021    Status post reverse arthroplasty of shoulder, right 06/02/2021   Squamous cell lung cancer ([place]) 02/11/2020   Drug-induced polyneuropathy ([place]) 01/23/2020   Low vitamin B12 level 01/14/2019   Pulmonary hypertension, unspecified ([place]) 12/01/2018   Chronic respiratory failure with hypoxia ([place]) 12/01/2018   Normocytic anemia 10/17/2018   Recurrent pleural effusion on right 07/11/2018   Environmental and seasonal allergies 12/20/2017   Muscle spasms of neck 12/20/2017   Coronary artery disease of bypass graft of native heart with stable angina pectoris ([place]) 04/21/2017   Chronic combined systolic and diastolic CHF (congestive heart failure) ([name]) 04/14/2017   Sinus node dysfunction ([place]) 04/06/2017   Back pain of thoracolumbar region 03/15/2017   PAF (paroxysmal atrial fibrillation) ([place]) 03/05/2017   Falls frequently 03/05/2017   Compression fracture of L2 lumbar vertebra ([place]) 08/31/2016   Cardiomyopathy ([place]) 04/29/2016   Hx of adenomatous colonic polyps 11/17/2015   Radiation pneumonitis ([place]) 10/21/2015   Mitral regurgitation    HLD (hyperlipidemia)    Paroxysmal supraventricular tachycardia (HCC)    NSTEMI (non-ST elevated myocardial infarction) ([place])    Arteriosclerosis of coronary artery 01/11/2015   Hypothyroidism (acquired) 01/11/2015   Disorder of peripheral nervous system 10/04/2014   Cervical radiculopathy, chronic 10/04/2014   Atrial flutter ([place]) 11/16/2013   Chronic obstructive pulmonary disease ([place]) 04/24/2012   Essential hypertension 08/03/2011   Pacemaker 08/03/2011    Past Surgical History:  Procedure Laterality Date   APPENDECTOMY     CARDIAC CATHETERIZATION N/A 04/28/2015   Procedure: Left Heart Cath and Coronary Angiography;  Surgeon: Wellington Hampshire, MD;  Location: [facility];  Service: Cardiovascular;  Laterality: N/A;   CARDIAC ELECTROPHYSIOLOGY STUDY AND ABLATION N/A 05/10/2016   Procedure(s): INTRACARDIAC ELECTROPHYSIOLOGIC 3D MAPPING,  STIMULATION PACING HEART, ICAR CATHETER ABLATION ARRHYTHMIA ADD ON, ABLATE L/R ATRIAL FIBRIL W/ISOLATED PULM VEIN, INTRACARD ECHO, THER/DX INTERVENT; Location: [facility]; Surgeon: Norm Salt, MD   CARDIOVERSION N/A 04/08/2017   Procedure: CARDIOVERSION;  Surgeon: Wellington Hampshire, MD;  Location: [facility];  Service: Cardiovascular;  Laterality: N/A;   CHEST TUBE INSERTION N/A 07/11/2018   Procedure: FAOZHY CATH INSERTION;  Surgeon: [name], MD;  Location: [facility];  Service: Thoracic;  Laterality: N/A;   COLONOSCOPY  12/2009   2 small tubular adenomas   CORONARY ANGIOPLASTY WITH STENT PLACEMENT Left 2002   s/p MI x 2 with subsequent PCI x 2 ; stents (unknown type) placed to the p-mRCA, [name], and [name].   CORONARY ARTERY BYPASS GRAFT  09/2000   DRAIN REMOVAL Right 08/10/2018   Procedure: DRAIN REMOVAL;  Surgeon: [name], MD;  Location: [facility];  Service: General;  Laterality: Right;   ECTOPIC PREGNANCY SURGERY     ELECTROMAGNETIC NAVIGATION BROCHOSCOPY N/A 10/06/2015   Procedure: ELECTROMAGNETIC NAVIGATION BRONCHOSCOPY;  Surgeon: Flora Lipps, MD;  Location: [facility];  Service: Cardiopulmonary;  Laterality: N/A;   ENDOBRONCHIAL ULTRASOUND N/A 10/06/2015   Procedure: ENDOBRONCHIAL ULTRASOUND;  Surgeon: Flora Lipps, MD;  Location: [facility];  Service: Cardiopulmonary;  Laterality: N/A;   EYE SURGERY     FLEXIBLE BRONCHOSCOPY Right 07/11/2018   Procedure: FLEXIBLE BRONCHOSCOPY;  Surgeon: [name], MD;  Location: [facility];  Service: Thoracic;  Laterality: Right;   KYPHOPLASTY N/A 09/28/2016   Procedure: KYPHOPLASTY;  Surgeon: Hessie Knows, MD;  Location: [facility];  Service: Orthopedics;  Laterality: N/A;   KYPHOPLASTY N/A 02/20/2018   Procedure: [id number];  Surgeon: Hessie Knows, MD;  Location: [facility];  Service: Orthopedics;  Laterality: N/A;   PACEMAKER INSERTION  03/2012   REVERSE SHOULDER ARTHROPLASTY Right 06/02/2021   Procedure: REVERSE SHOULDER ARTHROPLASTY;  Surgeon:  Corky Mull, MD;  Location: [facility];  Service: Orthopedics;  Laterality: Right;   SVT ABLATION N/A 07/27/2013   Procedure: SVT ABLATION; Location: [facility]; Surgeon: Rolla Etienne, MD   thorocentesis  12/24/2016   VAGINAL HYSTERECTOMY     partial - left ovary remains   VIDEO ASSISTED THORACOSCOPY Right 07/11/2018   Procedure: VIDEO ASSISTED THORACOSCOPY;  Surgeon: [name], MD;  Location: [facility];  Service: Thoracic;  Laterality: Right;   VIDEO ASSISTED THORACOSCOPY (VATS) W/TALC PLEUADESIS Right 07/11/2018   Procedure: THORACOTOMY PLEURAL BIOPSY WITH TALC PLEURODESIS;  Surgeon: [name], MD;  Location: [facility];  Service: Thoracic;  Laterality: Right;  pleural biopsy    Prior to Admission medications   Medication Sig Start Date End Date Taking? Authorizing Provider  acetaminophen (TYLENOL) 500 MG tablet Take 500-1,000 mg by mouth every 4 (four) hours as needed for mild pain or moderate pain.   Yes [provider]  apixaban (ELIQUIS) 2.5 MG TABS tablet Take 1 tablet (2.5 mg total) by mouth 2 (two) times daily. 02/03/21  Yes Agbor-Etang, Aaron Edelman, MD  budesonide (PULMICORT) 0.5 MG/2ML nebulizer solution INHALE 2 MILLILITERS BY NEBULIZER 2 TIMES DAILY 06/27/20  Yes Flora Lipps, MD  cetirizine (ZYRTEC) 10 MG tablet TAKE (1) TABLET BY MOUTH EVERY DAY 06/15/18  Yes Glean Hess, MD  ENTRESTO 24-26 MG TAKE (1) TABLET BY MOUTH TWICE DAILY 01/27/21  Yes Minna Merritts, MD  furosemide (LASIX) 20 MG tablet TAKE 1 TABLET BY MOUTH TWICE DAILY, ALTERNATING WITH 1 EXTRA TABLET AS NEEDED FOR SWELLING. Patient taking differently: Take 20 mg by mouth See admin instructions. Take 20 mg daily, may take a second 20 mg dose as needed for swelling 05/15/21  Yes Gollan, Kathlene November, MD  ipratropium-albuterol (DUONEB) 0.5-2.5 (3) MG/3ML SOLN TAKE 3 MLS BY NEBULIZATION EVERY 6 HOURSAS NEEDED FOR WHEEZING OR SHORTNESS OF BREATH 06/18/21  Yes Kasa, Maretta Bees, MD  ketorolac (ACULAR) 0.5 % ophthalmic  solution Place 1 drop into the right eye in the morning, at noon, and at bedtime. 05/05/21  Yes [provider]  levothyroxine (SYNTHROID) 88 MCG tablet TAKE (1) TABLET BY MOUTH EVERY DAY BEFORE BREAKFAST 01/08/21  Yes Glean Hess, MD  metoprolol succinate (TOPROL-XL) 25 MG 24 hr tablet Take 2 tablets (50 mg total) by mouth in the morning and at bedtime. 02/12/21  Yes Gollan, Kathlene November, MD  Multiple Vitamins-Minerals (CENTRUM SILVER PO) Take 1 tablet by mouth daily.   Yes [provider]  oxyCODONE (OXY IR/ROXICODONE) 5 MG immediate release tablet Take 1 tablet (5 mg total) by mouth every 4 (four) hours as needed for moderate pain. 06/03/21  Yes Damaris Hippo  Lance, PA-C  rosuvastatin (CRESTOR) 5 MG tablet TAKE ONE (1) TABLET BY MOUTH ONCE DAILY Patient taking differently: Take 5 mg by mouth at bedtime. 03/30/21  Yes Gollan, Kathlene November, MD  vitamin B-12 (CYANOCOBALAMIN) 1000 MCG tablet Take 1,000 mcg by mouth daily.   Yes [provider]  YUPELRI 175 MCG/3ML nebulizer solution Use one vial in nebulizer once daily. Do not mix with other nebulized medications. 04/23/21  Yes Kasa, Maretta Bees, MD  fluticasone (FLONASE) 50 MCG/ACT nasal spray Place 2 sprays into both nostrils daily. Patient taking differently: Place 1 spray into both nostrils daily as needed for allergies. 01/23/20   Glean Hess, MD  guaiFENesin (MUCINEX) 600 MG 12 hr tablet Take 1 tablet (600 mg total) by mouth 2 (two) times daily as needed. 02/11/20   Martyn Ehrich, NP  OXYGEN Inhale 3 L into the lungs.     [provider]  Vitamins A & D (VITAMIN A & D) ointment Apply 1 application topically as needed (irritation). Patient not taking: Reported on 06/23/2021    [provider]    Allergies Cefdinir, Lovenox [enoxaparin sodium], and Meperidine  Family History  Problem Relation Age of Onset   COPD Mother        sister, and brother   Lung disease Father    Stroke Maternal Grandmother     Hypertension Sister    COPD Sister    Hypertension Brother    COPD Brother    Colon cancer Brother    Heart attack Neg Hx    Breast cancer Neg Hx     Social History Social History   Tobacco Use   Smoking status: Former    Packs/day: 1.00    Years: 40.00    Pack years: 40.00    Types: Cigarettes    Quit date: 2001    Years since quitting: 21.8   Smokeless tobacco: Never   Tobacco comments:    quit smoking in 08/28/2000. Smoking cessation materials not required  Vaping Use   Vaping Use: Never used  Substance Use Topics   Alcohol use: Yes    Alcohol/week: 10.0 standard drinks    Types: 10 Glasses of wine per week    Comment: 1 glass of wine per day   Drug use: No    Review of Systems Constitutional: No fever/chills Eyes: No visual changes. ENT: No sore throat. Cardiovascular: Denies chest pain. Respiratory: Endorses shortness of breath. Gastrointestinal: No abdominal pain.  No nausea, no vomiting.  No diarrhea. Genitourinary: Negative for dysuria. Musculoskeletal: Negative for acute arthralgias Skin: Negative for rash. Neurological: Negative for headaches, weakness/numbness/paresthesias in any extremity Psychiatric: Negative for suicidal ideation/homicidal ideation   ____________________________________________   PHYSICAL EXAM:  VITAL SIGNS: ED Triage Vitals  Enc Vitals Group     BP 06/23/21 1455 (!) 91/58     Pulse Rate 06/23/21 1455 89     Resp 06/23/21 1455 (!) 24     Temp 06/23/21 1455 97.7 F (36.5 C)     Temp Source 06/23/21 1455 Oral     SpO2 06/23/21 1455 (!) 88 %     Weight --      Height --      Head Circumference --      Peak Flow --      Pain Score 06/23/21 1456 4     Pain Loc --      Pain Edu? --      Excl. in [place]? --    Constitutional: Alert  and oriented. Well appearing and in no acute distress. Eyes: Conjunctivae are normal. PERRL. Head: Atraumatic. Nose: No congestion/rhinnorhea. Mouth/Throat: Mucous membranes are moist. Neck:  No stridor Cardiovascular: Grossly normal heart sounds.  Good peripheral circulation. Respiratory: 6 L nasal cannula in place.  Rales over bilateral lung fields normal respiratory effort.  No retractions. Gastrointestinal: Soft and nontender. No distention. Musculoskeletal: No obvious deformities Neurologic:  Normal speech and language. No gross focal neurologic deficits are appreciated. Skin:  Skin is warm and dry. No rash noted. Psychiatric: Mood and affect are normal. Speech and behavior are normal.  ____________________________________________   LABS (all labs ordered are listed, but only abnormal results are displayed)  Labs Reviewed  COMPREHENSIVE METABOLIC PANEL - Abnormal; Notable for the following components:      Result Value   Potassium 2.8 (*)    Glucose, Bld 124 (*)    Creatinine, Ser 1.16 (*)    Calcium 8.8 (*)    Albumin 3.4 (*)    Total Bilirubin 1.5 (*)    GFR, Estimated 47 (*)    All other components within normal limits  CBC WITH DIFFERENTIAL/PLATELET - Abnormal; Notable for the following components:   RBC 3.55 (*)    Hemoglobin 11.0 (*)    HCT 34.5 (*)    RDW 17.7 (*)    All other components within normal limits  BRAIN NATRIURETIC PEPTIDE - Abnormal; Notable for the following components:   B Natriuretic Peptide 2,537.6 (*)    All other components within normal limits  TROPONIN I (HIGH SENSITIVITY) - Abnormal; Notable for the following components:   Troponin I (High Sensitivity) 26 (*)    All other components within normal limits  TROPONIN I (HIGH SENSITIVITY) - Abnormal; Notable for the following components:   Troponin I (High Sensitivity) 29 (*)    All other components within normal limits  RESP PANEL BY RT-PCR (FLU A&B, COVID) ARPGX2  MAGNESIUM  BASIC METABOLIC PANEL  CBC   ____________________________________________  EKG  ED ECG REPORT I, Naaman Plummer, the attending physician, personally viewed and interpreted this ECG.  Date:  06/23/2021 EKG Time: 1449 Rate: 82 Rhythm: normal sinus rhythm QRS Axis: normal Intervals: normal ST/T Wave abnormalities: normal Narrative Interpretation: no evidence of acute ischemia  ____________________________________________  RADIOLOGY  ED MD interpretation: 2 view chest x-ray shows bilateral lung opacities concerning for pulmonary edema  Official radiology report(s): DG Chest 2 View  Result Date: 06/23/2021 CLINICAL DATA:  Shortness of breath. EXAM: CHEST - 2 VIEW COMPARISON:  May 19, 2021. FINDINGS: Stable cardiomediastinal silhouette. Status post cardiac valve repair. Right internal jugular Port-A-Cath is unchanged in position. Left-sided pacemaker is unchanged in position. Status post right shoulder arthroplasty. Stable bibasilar atelectasis or scarring is noted with small pleural effusions. Stable right perihilar opacity is noted most consistent with scarring or postoperative change. IMPRESSION: Stable bilateral lung opacities are noted compared to prior exam. Interval placement of right total shoulder arthroplasty. Electronically Signed   By: Marijo Conception M.D.   On: 06/23/2021 16:16    ____________________________________________   PROCEDURES  Procedure(s) performed (including Critical Care):  .1-3 Lead EKG Interpretation Performed by: Naaman Plummer, MD Authorized by: Naaman Plummer, MD     Interpretation: normal     ECG rate:  84   ECG rate assessment: normal     Rhythm: sinus rhythm     Ectopy: none     Conduction: normal    CRITICAL CARE Performed by: Naaman Plummer  Total critical care time: 37 minutes  Critical care time was exclusive of separately billable procedures and treating other patients.  Critical care was necessary to treat or prevent imminent or life-threatening deterioration.  Critical care was time spent personally by me on the following activities: development of treatment plan with patient and/or surrogate as well as  nursing, discussions with consultants, evaluation of patient's response to treatment, examination of patient, obtaining history from patient or surrogate, ordering and performing treatments and interventions, ordering and review of laboratory studies, ordering and review of radiographic studies, pulse oximetry and re-evaluation of patient's condition.  ____________________________________________   INITIAL IMPRESSION / ASSESSMENT AND PLAN / ED COURSE  As part of my medical decision making, I reviewed the following data within the electronic medical record, if available:  Nursing notes reviewed and incorporated, Labs reviewed, EKG interpreted, Old chart reviewed, Radiograph reviewed and Notes from prior ED visits reviewed and incorporated      Endorses dyspnea, endorses LE edema Denies Non adherence to medication regimen  Workup: ECG, CBC, BMP, Troponin, BNP, CXR Findings: EKG: No STEMI and no evidence of Brugadas sign, delta wave, epsilon wave, significantly prolonged QTc, or malignant arrhythmia. BNP: 2537 CXR: Bilateral pulmonary edema Based on history, exam and findings, presentation most consistent with acute on chronic heart failure. Low suspicion for PNA, ACS, tamponade, aortic dissection. Interventions: Oxygen, Diuresis  Reassessment: Symptoms improved in ED with oxygen and diuresis  Disposition (Stable but not significantly improved): Admit to medicine for further monitoring and for improvement of medication regimen to control symptoms.     ____________________________________________   FINAL CLINICAL IMPRESSION(S) / ED DIAGNOSES  Final diagnoses:  Acute on chronic congestive heart failure, unspecified heart failure type ([place])  Acute on chronic respiratory failure with hypoxia [facility])     ED Discharge Orders     None        Note:  This document was prepared using Dragon voice recognition software and may include unintentional dictation errors.    Naaman Plummer, MD 06/23/21 [phone]

## 2021-06-23 NOTE — ED Triage Notes (Signed)
Pt reports to ED for low potassium, shob for a few days, low bp.  SpO2 60s in triage, pt wears 3L [place] chronic. Tank empty. Tank replaced, placed on 5L [place].  Dr Charna Archer MSE in triage.

## 2021-06-23 NOTE — ED Notes (Signed)
Spoke with Dr Tobie Poet. Ok to run potassium at 15mL/hr with normal saline at 50 mL/hr.

## 2021-06-23 NOTE — ED Provider Notes (Signed)
Emergency Medicine Provider Triage Evaluation Note  Jenny Cooper , a 85 y.o. female  was evaluated in triage.  Pt complains of increasing generalized weakness, cough, shortness of breath, and leg swelling for about the past week.  Review of Systems  Positive: Shortness of breath, generalized weakness, cough, and leg swelling. Negative: Fever, chest pain, vomiting, abdominal pain.  Physical Exam  There were no vitals taken for this visit. Gen:   Awake, no distress Resp:  Mildly tachypneic with no respiratory distress, crackles noted to bilateral bases. MSK:   Moves extremities without difficulty, 1+ pitting edema to bilateral lower extremities.  2+ radial pulses bilaterally. Other:  No abdominal tenderness to palpation.  Medical Decision Making  Medically screening exam initiated at 2:53 PM.  Appropriate orders placed.  Jenny Cooper was informed that the remainder of the evaluation will be completed by another provider, this initial triage assessment does not replace that evaluation, and the importance of remaining in the ED until their evaluation is complete.  85 year old female presents to the ED with increasing weakness, cough, and shortness of breath over the past week, has also noticed increasing swelling in her legs.  We will work-up for CHF exacerbation with EKG, chest x-ray, and labs.  Patient also noted to be hypokalemic outpatient and we will check mag level.   Blake Divine, MD 06/23/21 1501

## 2021-06-23 NOTE — Telephone Encounter (Signed)
-----   Message from Arvil Chaco, PA-C sent at 06/23/2021  1:31 PM EDT ----- Labs show significantly elevated fluid lab, as we suspected today during clinic. Kidney function has bumped from that of previous labs 2 weeks ago, which can happen when holding onto too much fluid. Blood counts stable Potassium is dangerously low.    She should go to the emergency department.

## 2021-06-23 NOTE — ED Notes (Signed)
Pt alert with family at bedside. NADN

## 2021-06-24 ENCOUNTER — Observation Stay (HOSPITAL_COMMUNITY)
Admit: 2021-06-24 | Discharge: 2021-06-24 | Disposition: A | Discharge status: Home / Self Care | Payer: Medicare Other | Attending: Internal Medicine | Admitting: Internal Medicine

## 2021-06-24 ENCOUNTER — Other Ambulatory Visit: Payer: Self-pay

## 2021-06-24 DIAGNOSIS — I480 Paroxysmal atrial fibrillation: Secondary | ICD-10-CM | POA: Diagnosis not present

## 2021-06-24 DIAGNOSIS — R778 Other specified abnormalities of plasma proteins: Secondary | ICD-10-CM

## 2021-06-24 DIAGNOSIS — E876 Hypokalemia: Secondary | ICD-10-CM | POA: Diagnosis not present

## 2021-06-24 DIAGNOSIS — I5023 Acute on chronic systolic (congestive) heart failure: Secondary | ICD-10-CM | POA: Diagnosis not present

## 2021-06-24 DIAGNOSIS — R0609 Other forms of dyspnea: Secondary | ICD-10-CM | POA: Diagnosis not present

## 2021-06-24 LAB — MAGNESIUM: Magnesium: 1.9 mg/dL (ref 1.7–2.4)

## 2021-06-24 LAB — CBC
HCT: 35.6 % — ABNORMAL LOW (ref 36.0–46.0)
Hemoglobin: 11 g/dL — ABNORMAL LOW (ref 12.0–15.0)
MCH: 29.7 pg (ref 26.0–34.0)
MCHC: 30.9 g/dL (ref 30.0–36.0)
MCV: 96.2 fL (ref 80.0–100.0)
Platelets: 215 10*3/uL (ref 150–400)
RBC: 3.7 MIL/uL — ABNORMAL LOW (ref 3.87–5.11)
RDW: 17.9 % — ABNORMAL HIGH (ref 11.5–15.5)
WBC: 5.2 10*3/uL (ref 4.0–10.5)
nRBC: 0 % (ref 0.0–0.2)

## 2021-06-24 LAB — ECHOCARDIOGRAM COMPLETE
AR max vel: 0.97 cm2
AV Area VTI: 1.11 cm2
AV Area mean vel: 0.96 cm2
AV Mean grad: 3 mmHg
AV Peak grad: 5.8 mmHg
Ao pk vel: 1.2 m/s
Area-P 1/2: 4.39 cm2
Height: 66 in
MV VTI: 1.01 cm2
S' Lateral: 2.86 cm
Weight: 2368 oz

## 2021-06-24 LAB — BASIC METABOLIC PANEL
Anion gap: 5 (ref 5–15)
Anion gap: 7 (ref 5–15)
BUN: 15 mg/dL (ref 8–23)
BUN: 17 mg/dL (ref 8–23)
CO2: 29 mmol/L (ref 22–32)
CO2: 30 mmol/L (ref 22–32)
Calcium: 8.6 mg/dL — ABNORMAL LOW (ref 8.9–10.3)
Calcium: 8.7 mg/dL — ABNORMAL LOW (ref 8.9–10.3)
Chloride: 100 mmol/L (ref 98–111)
Chloride: 102 mmol/L (ref 98–111)
Creatinine, Ser: 1.11 mg/dL — ABNORMAL HIGH (ref 0.44–1.00)
Creatinine, Ser: 1.13 mg/dL — ABNORMAL HIGH (ref 0.44–1.00)
GFR, Estimated: 48 mL/min — ABNORMAL LOW (ref >60)
GFR, Estimated: 49 mL/min — ABNORMAL LOW (ref >60)
Glucose, Bld: 103 mg/dL — ABNORMAL HIGH (ref 70–99)
Glucose, Bld: 94 mg/dL (ref 70–99)
Potassium: 3.7 mmol/L (ref 3.5–5.1)
Potassium: 4.3 mmol/L (ref 3.5–5.1)
Sodium: 136 mmol/L (ref 135–145)
Sodium: 137 mmol/L (ref 135–145)

## 2021-06-24 LAB — PHOSPHORUS: Phosphorus: 2.6 mg/dL (ref 2.5–4.6)

## 2021-06-24 LAB — TROPONIN I (HIGH SENSITIVITY): Troponin I (High Sensitivity): 26 ng/L — ABNORMAL HIGH (ref <18)

## 2021-06-24 MED ORDER — PERFLUTREN LIPID MICROSPHERE
1.0000 mL | INTRAVENOUS | Status: AC | PRN
  Administered 2021-06-24: 3 mL via INTRAVENOUS
  Filled 2021-06-24: qty 10

## 2021-06-24 MED ORDER — METOPROLOL SUCCINATE ER 25 MG PO TB24
12.5000 mg | ORAL_TABLET | Freq: Every day | ORAL | Status: DC
  Administered 2021-06-24 – 2021-06-25 (×2): 12.5 mg via ORAL
  Filled 2021-06-24 (×2): qty 0.5

## 2021-06-24 MED ORDER — METOPROLOL SUCCINATE ER 25 MG PO TB24: 12.5000 mg | ORAL_TABLET | ORAL | Status: DC

## 2021-06-24 MED ORDER — FUROSEMIDE 10 MG/ML IJ SOLN
20.0000 mg | Freq: Two times a day (BID) | INTRAMUSCULAR | Status: DC
  Administered 2021-06-24 – 2021-06-26 (×4): 20 mg via INTRAVENOUS
  Filled 2021-06-24: qty 4
  Filled 2021-06-24: qty 2
  Filled 2021-06-24 (×2): qty 4

## 2021-06-24 NOTE — Consult Note (Signed)
Cardiology Consultation:   Patient ID: Jenny Cooper MRN: [id number]; DOB: September 25, 1935  Admit date: 06/23/2021 Date of Consult: 06/24/2021  PCP:  Glean Hess, MD   [facility] Providers Cardiologist:  Ida Rogue, MD  Cardiology APP:  Alisa Graff, FNP  {   Patient Profile:   Jenny Cooper is a 85 y.o. female with a hx of history of COPD on home oxygen, former tobacco use, stage III squamous cell lung carcinoma s/p chemoradiation therapy 4801, diastolic heart failure, hypertension, paroxysmal atrial fibrillation/flutter on chronic anticoagulation and amiodarone therapy, CAD s/p CABG, PPM, who is being seen 06/24/2021 for the evaluation of CHF at the request of Dr. Kurtis Bushman.  History of Present Illness:   [name] is followed by Dr. Candis Musa for the above cardiac issues. Recently patient underwent right shoulder replacement. Prior to surgery she was told potassium was low and was given tablets to take. She was seen in the cardiology office 06/23/21 for weakness and fatigue. Labs were checked, which showed K of 2.6 with AKI and she was sent to the ER.   The patient presented to the ER 10/25 for shortness of breath. Seh is on 3L O2 at home and had to increase is to 5-6 L. No chest pain. Also reported generalized weakness and PND.   In the ER BP 91/58, pulse 89, RR 24, afebrile, 88% O2. Labs showed K 2.8, Scr 1.16, albumin 3.4, Hgb 11, BNP 2,500. HS rtop 26>29. EKG showed NSR with heart rate of 82bpm. CXR with stable lung opacities compared to prior, concerning for pulmonary edema.   Past Medical History:  Diagnosis Date   Acute midline low back pain without sciatica 08/13/2016   Acute on chronic respiratory failure with hypoxia ([place]) 08/21/2018   Acute respiratory failure with hypoxia ([place]) 12/11/2017   Allergy    Aortic atherosclerosis ([place])    Atypical atrial flutter ([place])    a.) s/p ablation 07/27/2013 and 05/10/2016   Balance problem    CAD (coronary artery  disease)    a. s/p MI x 2 in 2002 s/p PCI x 2 in 2002; b. s/p 2v CABG 2002; c. stress echo 07/2004 w/ evi of pos & inf infarct & no evi of ischemia; d. 4/08 dipyridamole scan w/ multiple areas of infarct, no ischemia, EF 49%; e. cath 04/28/15 3v CAD, med Rx rec, no targets for revasc, LM lum irregs, pLAD 30%, 100%, ost-pLCx 60%, [name] 99%, OM2 100%, p-mRCA 90%, [name] 100% L-R collats, VG-mLAD irregs, VG-OM2 oc   Chronic anticoagulation    a.) Apixaban   Chronic systolic CHF (congestive heart failure) ([place])    a. echo 03/2015: EF 30-35%, sev ant/inf/pos HK, in mild to mod MR   Complication of anesthesia    a.) postoperative apnea. b.) postoperative hypoxia.   Compressed spine fracture ([place]) 08/06/2016   a.) L2, T11, T12   COPD (chronic obstructive pulmonary disease) (HCC)    Deep vein thrombosis (DVT) of left lower extremity ([place]) 12/2001   Fracture of multiple pubic rami, right, closed, initial encounter ([place]) 05/27/2016   a.) RIGHT superior and inferior pubic rami fractures s/p mechanical fall.   GERD (gastroesophageal reflux disease)    HLD (hyperlipidemia)    HTN (hypertension)    Hypothyroidism    Ischemic cardiomyopathy    Mitral regurgitation    a. s/p mitral ring placement 09/2000; b. echo 09/2010: EF 50%, inf HK, post AK, mild MR, prosthetic mitral valve ring w/ peak gradient of 10 mmHg;  b. echo 2/13: EF 50%, mild MR/TR      Myocardial infarction ([place]) 2002   a.) x 2 in 2002; PCI with stents (unknown type) placed to the p-mRCA, [name], and [name].   Neuropathy    Osteoarthritis    Osteoporosis    Oxygen dependent    a.) 3 L/[place]   Pacemaker    a. MDT 2002; b. generator replacement 2013; c. followed by Dr. Omelia Blackwater, MD   PAF (paroxysmal atrial fibrillation) [facility])    a.) s/p DCCV on 04/08/2017. b.) on apixaban   Personal history of chemotherapy    Personal history of radiation therapy    Pneumonia    S/P CABG x 2 2002   a.) SVG-LAD, SVG-OM   Squamous cell carcinoma of right lung  ([place]) 01/03/2015   a.) stage IIIa. b.) s/p concurrent chemotherapy (carboplatin + paclitaxel) and XRT (IMRT 6000 cGy)   SVT (supraventricular tachycardia) (HCC)    Type II diabetes mellitus with complication ([place]) 31/51/7616    Past Surgical History:  Procedure Laterality Date   APPENDECTOMY     CARDIAC CATHETERIZATION N/A 04/28/2015   Procedure: Left Heart Cath and Coronary Angiography;  Surgeon: [place] Hampshire, MD;  Location: [place] [facility];  Service: Cardiovascular;  Laterality: N/A;   CARDIAC ELECTROPHYSIOLOGY STUDY AND ABLATION N/A 05/10/2016   Procedure(s): INTRACARDIAC ELECTROPHYSIOLOGIC 3D MAPPING, STIMULATION PACING HEART, ICAR CATHETER ABLATION ARRHYTHMIA ADD ON, ABLATE L/R ATRIAL FIBRIL W/ISOLATED PULM VEIN, INTRACARD ECHO, THER/DX INTERVENT; Location: [facility]; Surgeon: Norm Salt, MD   CARDIOVERSION N/A 04/08/2017   Procedure: CARDIOVERSION;  Surgeon: [place] Hampshire, MD;  Location: [facility];  Service: Cardiovascular;  Laterality: N/A;   CHEST TUBE INSERTION N/A 07/11/2018   Procedure: WVPXTG CATH INSERTION;  Surgeon: [name], MD;  Location: [facility];  Service: Thoracic;  Laterality: N/A;   COLONOSCOPY  12/2009   2 small tubular adenomas   CORONARY ANGIOPLASTY WITH STENT PLACEMENT Left 2002   s/p MI x 2 with subsequent PCI x 2 ; stents (unknown type) placed to the p-mRCA, [name], and [name].   CORONARY ARTERY BYPASS GRAFT  09/2000   DRAIN REMOVAL Right 08/10/2018   Procedure: DRAIN REMOVAL;  Surgeon: [name], MD;  Location: [facility];  Service: General;  Laterality: Right;   ECTOPIC PREGNANCY SURGERY     ELECTROMAGNETIC NAVIGATION BROCHOSCOPY N/A 10/06/2015   Procedure: ELECTROMAGNETIC NAVIGATION BRONCHOSCOPY;  Surgeon: Flora Lipps, MD;  Location: [facility];  Service: Cardiopulmonary;  Laterality: N/A;   ENDOBRONCHIAL ULTRASOUND N/A 10/06/2015   Procedure: ENDOBRONCHIAL ULTRASOUND;  Surgeon: Flora Lipps, MD;  Location: [facility];  Service: Cardiopulmonary;   Laterality: N/A;   EYE SURGERY     FLEXIBLE BRONCHOSCOPY Right 07/11/2018   Procedure: FLEXIBLE BRONCHOSCOPY;  Surgeon: [name], MD;  Location: [facility];  Service: Thoracic;  Laterality: Right;   KYPHOPLASTY N/A 09/28/2016   Procedure: KYPHOPLASTY;  Surgeon: Hessie Knows, MD;  Location: [facility];  Service: Orthopedics;  Laterality: N/A;   KYPHOPLASTY N/A 02/20/2018   Procedure: [id number];  Surgeon: Hessie Knows, MD;  Location: [facility];  Service: Orthopedics;  Laterality: N/A;   PACEMAKER INSERTION  03/2012   REVERSE SHOULDER ARTHROPLASTY Right 06/02/2021   Procedure: REVERSE SHOULDER ARTHROPLASTY;  Surgeon: Corky Mull, MD;  Location: [facility];  Service: Orthopedics;  Laterality: Right;   SVT ABLATION N/A 07/27/2013   Procedure: SVT ABLATION; Location: [facility]; Surgeon: Rolla Etienne, MD   thorocentesis  12/24/2016   VAGINAL HYSTERECTOMY     partial - left ovary  remains   VIDEO ASSISTED THORACOSCOPY Right 07/11/2018   Procedure: VIDEO ASSISTED THORACOSCOPY;  Surgeon: [name], MD;  Location: [facility];  Service: Thoracic;  Laterality: Right;   VIDEO ASSISTED THORACOSCOPY (VATS) W/TALC PLEUADESIS Right 07/11/2018   Procedure: THORACOTOMY PLEURAL BIOPSY WITH TALC PLEURODESIS;  Surgeon: [name], MD;  Location: [facility];  Service: Thoracic;  Laterality: Right;  pleural biopsy     Home Medications:  Prior to Admission medications   Medication Sig Start Date End Date Taking? Authorizing Provider  acetaminophen (TYLENOL) 500 MG tablet Take 500-1,000 mg by mouth every 4 (four) hours as needed for mild pain or moderate pain.   Yes [provider]  apixaban (ELIQUIS) 2.5 MG TABS tablet Take 1 tablet (2.5 mg total) by mouth 2 (two) times daily. 02/03/21  Yes Agbor-Etang, Aaron Edelman, MD  budesonide (PULMICORT) 0.5 MG/2ML nebulizer solution INHALE 2 MILLILITERS BY NEBULIZER 2 TIMES DAILY 06/27/20  Yes Flora Lipps, MD  cetirizine (ZYRTEC) 10 MG tablet TAKE (1) TABLET BY  MOUTH EVERY DAY 06/15/18  Yes Glean Hess, MD  ENTRESTO 24-26 MG TAKE (1) TABLET BY MOUTH TWICE DAILY 01/27/21  Yes Minna Merritts, MD  furosemide (LASIX) 20 MG tablet TAKE 1 TABLET BY MOUTH TWICE DAILY, ALTERNATING WITH 1 EXTRA TABLET AS NEEDED FOR SWELLING. Patient taking differently: Take 20 mg by mouth See admin instructions. Take 20 mg daily, may take a second 20 mg dose as needed for swelling 05/15/21  Yes Gollan, Kathlene November, MD  ipratropium-albuterol (DUONEB) 0.5-2.5 (3) MG/3ML SOLN TAKE 3 MLS BY NEBULIZATION EVERY 6 HOURSAS NEEDED FOR WHEEZING OR SHORTNESS OF BREATH 06/18/21  Yes Kasa, Maretta Bees, MD  ketorolac (ACULAR) 0.5 % ophthalmic solution Place 1 drop into the right eye in the morning, at noon, and at bedtime. 05/05/21  Yes [provider]  levothyroxine (SYNTHROID) 88 MCG tablet TAKE (1) TABLET BY MOUTH EVERY DAY BEFORE BREAKFAST 01/08/21  Yes Glean Hess, MD  metoprolol succinate (TOPROL-XL) 25 MG 24 hr tablet Take 2 tablets (50 mg total) by mouth in the morning and at bedtime. 02/12/21  Yes Gollan, Kathlene November, MD  Multiple Vitamins-Minerals (CENTRUM SILVER PO) Take 1 tablet by mouth daily.   Yes [provider]  oxyCODONE (OXY IR/ROXICODONE) 5 MG immediate release tablet Take 1 tablet (5 mg total) by mouth every 4 (four) hours as needed for moderate pain. 06/03/21  Yes Lattie Corns, PA-C  rosuvastatin (CRESTOR) 5 MG tablet TAKE ONE (1) TABLET BY MOUTH ONCE DAILY Patient taking differently: Take 5 mg by mouth at bedtime. 03/30/21  Yes Gollan, Kathlene November, MD  vitamin B-12 (CYANOCOBALAMIN) 1000 MCG tablet Take 1,000 mcg by mouth daily.   Yes [provider]  YUPELRI 175 MCG/3ML nebulizer solution Use one vial in nebulizer once daily. Do not mix with other nebulized medications. 04/23/21  Yes Kasa, Maretta Bees, MD  fluticasone (FLONASE) 50 MCG/ACT nasal spray Place 2 sprays into both nostrils daily. Patient taking differently: Place 1 spray into both nostrils  daily as needed for allergies. 01/23/20   Glean Hess, MD  guaiFENesin (MUCINEX) 600 MG 12 hr tablet Take 1 tablet (600 mg total) by mouth 2 (two) times daily as needed. 02/11/20   Martyn Ehrich, NP  OXYGEN Inhale 3 L into the lungs.     [provider]  Vitamins A & D (VITAMIN A & D) ointment Apply 1 application topically as needed (irritation). Patient not taking: Reported on 06/23/2021    [provider]    Inpatient Medications: Scheduled Meds:  apixaban  2.5 mg Oral BID   budesonide  0.5 mg Nebulization Daily   levothyroxine  88 mcg Oral Q0600   metoprolol succinate  12.5 mg Oral Daily   potassium chloride  40 mEq Oral BID   revefenacin  175 mcg Nebulization Daily   rosuvastatin  5 mg Oral QHS   vitamin B-12  1,000 mcg Oral Daily   Continuous Infusions:  sodium chloride Stopped (06/23/21 2243)   PRN Meds: sodium chloride, acetaminophen **OR** acetaminophen, fluticasone, ipratropium-albuterol, ondansetron **OR** ondansetron (ZOFRAN) IV, oxyCODONE  Allergies:    Allergies  Allergen Reactions   Cefdinir Rash    Had hives after completing treatment - unsure if it was the cause.  Has received 1st generation cephalosporin many times (04/24/2012, 07/27/2013, 09/28/2016, 02/20/2018, 07/11/2018, 08/10/2018 without documented ADRs.   Lovenox [Enoxaparin Sodium] Itching   Meperidine Other (See Comments)    Other Reaction: pt does not like how it makes her feel Other reaction(s): Other (See Comments) Other Reaction: pt does not like how it makes her feel    Social History:   Social History   Socioeconomic History   Marital status: Widowed    Spouse name: Not on file   Number of children: 6   Years of education: Not on file   Highest education level: Some college, no degree  Occupational History   Occupation: Retired  Tobacco Use   Smoking status: Former    Packs/day: 1.00    Years: 40.00    Pack years: 40.00    Types: Cigarettes    Quit date:  2001    Years since quitting: 21.8   Smokeless tobacco: Never   Tobacco comments:    quit smoking in 08/28/2000. Smoking cessation materials not required  Vaping Use   Vaping Use: Never used  Substance and Sexual Activity   Alcohol use: Yes    Alcohol/week: 10.0 standard drinks    Types: 10 Glasses of wine per week    Comment: 1 glass of wine per day   Drug use: No   Sexual activity: Never  Other Topics Concern   Not on file  Social History Narrative   Pt's son lives with her   Social Determinants of Health   Financial Resource Strain: Not on file  Food Insecurity: Not on file  Transportation Needs: Not on file  Physical Activity: Not on file  Stress: Not on file  Social Connections: Not on file  Intimate Partner Violence: Not on file    Family History:    Family History  Problem Relation Age of Onset   COPD Mother        sister, and brother   Lung disease Father    Stroke Maternal Grandmother    Hypertension Sister    COPD Sister    Hypertension Brother    COPD Brother    Colon cancer Brother    Heart attack Neg Hx    Breast cancer Neg Hx      ROS:  Please see the history of present illness.   All other ROS reviewed and negative.     Physical Exam/Data:   Vitals:   06/24/21 0830 06/24/21 0900 06/24/21 0930 06/24/21 1000  BP: 95/63 113/60 106/68 108/63  Pulse: 89 93 92 88  Resp: _0 Temp:      TempSrc:      SpO2: 96% 94% 96% 93%    Intake/Output Summary (Last 24 hours)  at 06/24/2021 1209 Last data filed at 06/24/2021 0735 Gross per 24 hour  Intake --  Output 600 ml  Net -600 ml   Last 3 Weights 06/23/2021 06/02/2021 05/19/2021  Weight (lbs) 148 lb 150 lb 149 lb 14.6 oz  Weight (kg) 67.132 kg 68.04 kg 68 kg     There is no height or weight on file to calculate BMI.  General:  Well nourished, well developed, in no acute distress HEENT: normal Neck: no JVD Vascular: No carotid bruits; Distal pulses 2+ bilaterally Cardiac:  normal S1,  S2; RRR; no murmur  Lungs:  minimal crackles   Abd: soft, nontender, no hepatomegaly  Ext: minimal lower leg edema Musculoskeletal:  No deformities, BUE and BLE strength normal and equal Skin: warm and dry  Neuro:  CNs 2-12 intact, no focal abnormalities noted Psych:  Normal affect   EKG:  The EKG was personally reviewed and demonstrates:  NSR, 82bpm, LPFB, TWI anterolateral leads Telemetry:  Telemetry was personally reviewed and demonstrates:  NSR HR 70-80s  Relevant CV Studies:  Echocardiogram 03/06/2020  1. Left ventricular ejection fraction, by estimation, is 50 to 55%. The  left ventricle has low normal function. The left ventricle has no regional  wall motion abnormalities. Left ventricular diastolic parameters are  indeterminate.   2. Right ventricular systolic function is normal. The right ventricular  size is normal. There is moderately elevated pulmonary artery systolic  pressure. The estimated right ventricular systolic pressure is 68.3 mmHg.   3. Mild mitral valve regurgitation.   4. Aortic valve regurgitation is mild.    LHC  04/28/15 Prox LAD-2 lesion, 100% stenosed. Prox LAD-1 lesion, 30% stenosed. Ost Cx to Prox Cx lesion, 60% stenosed. Mid Cx lesion, 99% stenosed. The lesion was previously treated with a stent (unknown type) greater than two years ago. Ost 2nd Mrg to 2nd Mrg lesion, 100% stenosed. SVG was injected . Origin lesion, 100% stenosed. Prox RCA to Mid RCA lesion, 90% stenosed. The lesion was previously treated with a stent (unknown type) greater than two years ago. Mid RCA to Dist RCA lesion, 100% stenosed. The lesion was previously treated with a stent (unknown type) . SVG was injected . The graft exhibits minimal luminal irregularities. The left ventricular systolic function is normal. 1. Severe underlying three-vessel coronary artery disease with patent SVG to LAD. Occluded SVG likely to OM. The native RCA has severe in-stent restenosis in the  midsegment and is occluded distally at the sites of previously placed stents. There are left to right collaterals. 2. Moderately reduced LV systolic function with an ejection fraction of 35-40%. 3. Mildly dilated left ventricular end-diastolic pressure. Recommendations: There are no good options for revascularization. The native RCA is chronically occluded with left-to-right collaterals. The native left circumflex has diffuse disease and overall small in caliber. I recommend optimizing medical therapy for CAD and ischemic cardiomyopathy. I discontinued aspirin given that the patient is on anticoagulation. Wall Motion                  Left Heart   Left Ventricle The left ventricular size is normal.  The left ventricular systolic function is normal.  The ejection fraction could not be assess due to 35-40.  There are wall motion abnormalities in the left ventricle.   There are segmental wall motion abnormalities in the left ventricle.  Aortic Valve There is no aortic valve stenosis, and mild (2+) aortic regurgitation.    Coronary Diagrams   Diagnostic Dominance: Right Intervention  Monitor 03/2015 Monitor showed 50% Afib burden with peak rate of 116, mostly rate controlled. She would likely do better with decreased Afib burden. Would ideally like to attempt to titrate up her metoprolol first, if BP allows. If BP does not allow for this then may have to use amiodarone. First try to go up on the Toprol to 50 mg bid. If this drops her BP too low then we would likely need to go back down to 25 mg bid and start amiodarone for rhythm control. I also see that she has asked for a refill of her Toprol, please send this in. Follow up as planned.   MPI 03/2015 T wave inversion was noted during stress in the II, III and V4 leads. Defect 1: There is a large defect of severe severity present in the basal inferolateral and mid inferolateral location. Findings consistent with prior myocardial infarction  with peri-infarct ischemia. This is a high risk study. The left ventricular ejection fraction is severely decreased (<30%).   Laboratory Data:  High Sensitivity Troponin:   Recent Labs  Lab 06/23/21 1459 06/23/21 1726 06/24/21 0627  TROPONINIHS 26* 29* 26*     Chemistry Recent Labs  Lab 06/23/21 1459 06/24/21 0011 06/24/21 0627  NA 137 137 136  K 2.8* 4.3 3.7  CL 100 102 100  CO2 _0 GLUCOSE 124* 103* 94  BUN _1 CREATININE 1.16* 1.13* 1.11*  CALCIUM 8.8* 8.6* 8.7*  MG 1.7  --  1.9  GFRNONAA 47* 48* 49*  ANIONGAP _2 Recent Labs  Lab 06/23/21 1459  PROT 7.7  ALBUMIN 3.4*  AST 22  ALT 8  ALKPHOS 82  BILITOT 1.5*   Lipids No results for input(s): CHOL, TRIG, HDL, LABVLDL, LDLCALC, CHOLHDL in the last 168 hours.  Hematology Recent Labs  Lab 06/23/21 1103 06/23/21 1459 06/24/21 0627  WBC 6.9 6.4 5.2  RBC 3.69* 3.55* 3.70*  HGB 11.5* 11.0* 11.0*  HCT 36.2 34.5* 35.6*  MCV 98.1 97.2 96.2  MCH 31.2 31.0 29.7  MCHC 31.8 31.9 30.9  RDW 17.8* 17.7* 17.9*  PLT 223 230 215   Thyroid No results for input(s): TSH, FREET4 in the last 168 hours.  BNP Recent Labs  Lab 06/23/21 1103 06/23/21 1459  BNP 2,164.2* 2,537.6*    DDimer No results for input(s): DDIMER in the last 168 hours.   Radiology/Studies:  DG Chest 2 View  Result Date: 06/23/2021 CLINICAL DATA:  Shortness of breath. EXAM: CHEST - 2 VIEW COMPARISON:  May 19, 2021. FINDINGS: Stable cardiomediastinal silhouette. Status post cardiac valve repair. Right internal jugular Port-A-Cath is unchanged in position. Left-sided pacemaker is unchanged in position. Status post right shoulder arthroplasty. Stable bibasilar atelectasis or scarring is noted with small pleural effusions. Stable right perihilar opacity is noted most consistent with scarring or postoperative change. IMPRESSION: Stable bilateral lung opacities are noted compared to prior exam. Interval placement of right total  shoulder arthroplasty. Electronically Signed   By: Marijo Conception M.D.   On: 06/23/2021 16:16     Assessment and Plan:   Acute HFpEF - Seen in the cardiology office 10/25 for fatigue and heart failure symptoms. Labs showed hypokalemia, AKI and she was sent to th ER for further work-up. Worsening CHF symptoms after recent right shoulder replacement surgery. - PTA lasix 4m daily with an extra 260mPRN - BNP elevated to >2500 - PTA entresto and Toprol held for hypotension - s/p K  supplementation and potassium levels are normal - s/p Iv lasix 79m last night - UOP -1L.  - still with some volume on exam>>start IV lasix 244mBID - strict I/Os, daily weights, and monitor kidney function  Hypokalemia - resolved, most recent 3.7 - Mag wnl - monitor K with diuresis - likely needs daily potassium with lasix  AKI - Entresto held - Creatinine stable  Elevated troponin CAD s/p CABG in 2002 - HS trop elevated 26>29 - no chest pain reported - continue BB and statin - NO ASA with Eliquis - suspect demand ischemia in the setting of acute CHF. NO further ischemic work-up at this time  PAF - continue Eliquis 2.61m72mID for stroke ppx - low dose Toprol restarted - tele shows SR  HLD - continue statin  For questions or updates, please contact [facility] consult [email or web address] for contact info under    Signed, Rodnesha Elie H FNinfa MeekerA-C  06/24/2021 12:09 PM

## 2021-06-24 NOTE — ED Notes (Signed)
Secure chat sent to MD regarding current BP 96/82, HR 92, metoprolol due and whether to give despite BP. Awaiting response.

## 2021-06-24 NOTE — ED Notes (Signed)
Pt sleeping NADN 

## 2021-06-24 NOTE — Consult Note (Addendum)
    Heart Failure Nurse Navigator Note  HFpEF 50 to 55%.  Mild mitral regurgitation and mild aortic insufficiency.  Echocardiogram is pending on this admission.  She presented to the emergency room after she was seen in her cardiology's office for an abnormally low potassium, she had also complained of lower extremity edema and weakness x1 week.   Comorbidities:  Paroxysmal atrial fibrillation Squamous cell CTA of the lung status post chemoradiation therapy in 2016 Coronary artery disease/CABG COPD Hypertension Hyperlipidemia Mitral regurgitation Previous smoker  Medications:  Lasix 20 mg IV twice daily Metoprolol succinate 12-1/2 mg daily Eliquis 2.5 mg twice a day Rosuvastatin 5 mg at at bedtime Synthroid 88 mcg daily   Labs:  Sodium 136, potassium 3.7, chloride 100,, CO2 29, BUN 15, creatinine 1.11.  On admission potassium level was 2.7.  BNP 2537 and magnesium level 1.9.   Initial meeting with patient in the ED, she is lying on a gurney in no acute distress but does complain of leg cramping.   She states that before she broke her shoulder that she was weighing daily but since that surgery she got out of that habit.  Discussed the importance of daily weight and noting any weight gain and notifying physician.  Discussed salt and sodium intake.  She states that she does use salt on corn on the cob and her eggs, also once in a while will have bacon or ham but that is not something that she does routinely.  Discussed signs and symptoms to report to physician.   At 1 time she had followed up in the outpatient heart failure clinic, explained that she has an appointment to follow-up there on November 15 at 11 AM.  She does have a 3% history of no-shows.  She was given living with heart failure teaching booklet along with information on low-sodium.  We will continue to follow along.   Pricilla Riffle  RN [facility]

## 2021-06-24 NOTE — Progress Notes (Signed)
*  PRELIMINARY RESULTS* Echocardiogram 2D Echocardiogram has been performed.  Jenny Cooper 06/24/2021, 11:45 AM

## 2021-06-24 NOTE — ED Notes (Signed)
Pt assisted to BR. Pt tolerated poorly, became tachypneic and hypoxic. Will obtain portable O2 tank to use for ambulating to BR in the future. Pt settled back into chair, VS stable.

## 2021-06-24 NOTE — ED Notes (Signed)
Pt changed and moved to bedside chair. NADN family at bedside

## 2021-06-24 NOTE — ED Notes (Signed)
Echo at bedside

## 2021-06-24 NOTE — Progress Notes (Signed)
PROGRESS NOTE    Jenny Cooper  PPI:[id number] DOB: 1936/04/23 DOA: 06/23/2021 PCP: Glean Hess, MD    Brief Narrative:  Jenny Cooper is a 85 y.o. female with medical history significant for paroxysmal atrial fibrillation, history of squamous cell lung carcinoma stage III, status post chemoradiation therapy in 2016, on chronic anticoagulation and amiodarone therapy, CAD status post CABG in 2002, former tobacco use, COPD, history of left lower extremity DVT, hyperlipidemia, hypertension, hypothyroid, ischemic cardiomyopathy, mitral regurgitation, neuropathy,.She was told to come to the ED for further evaluation due to the low potassium.       Consultants:  Cardiology  Procedures:   Antimicrobials:      Subjective: Feels a little better. No cp  Objective: Vitals:   06/24/21 0145 06/24/21 0505 06/24/21 0732 06/24/21 0800  BP: 108/67 108/63 114/67 96/82  Pulse: 89 93 92 92  Resp: 16 17 18 17   Temp: 98.7 F (37.1 C)  (!) 97.5 F (36.4 C)   TempSrc: Oral  Oral   SpO2: 93% 96% 92% 95%    Intake/Output Summary (Last 24 hours) at 06/24/2021 0841 Last data filed at 06/24/2021 0735 Gross per 24 hour  Intake --  Output 600 ml  Net -600 ml   There were no vitals filed for this visit.  Examination:  General exam: Appears calm and comfortable  Respiratory system: decrease bs , no wheezing Cardiovascular system: S1 & S2 heard, RRR. No JVD, murmurs, rubs, gallops or clicks.  Gastrointestinal system: Abdomen is nondistended, soft and nontender.  Normal bowel sounds heard. Central nervous system: Awake and alert, grossly intact  Extremities: mild edema LE Psychiatry: Mood & affect appropriate.     Data Reviewed: I have personally reviewed following labs and imaging studies  CBC: Recent Labs  Lab 06/23/21 1103 06/23/21 1459 06/24/21 0627  WBC 6.9 6.4 5.2  NEUTROABS  --  4.5  --   HGB 11.5* 11.0* 11.0*  HCT 36.2 34.5* 35.6*  MCV 98.1 97.2 96.2  PLT 223  230 063   Basic Metabolic Panel: Recent Labs  Lab 06/23/21 1103 06/23/21 1459 06/24/21 0011 06/24/21 0627  NA 138 137 137 136  K 2.9* 2.8* 4.3 3.7  CL 99 100 102 100  CO2 28 26 30 29   GLUCOSE 109* 124* 103* 94  BUN 16 17 17 15   CREATININE 1.12* 1.16* 1.13* 1.11*  CALCIUM 8.8* 8.8* 8.6* 8.7*  MG  --  1.7  --  1.9  PHOS  --   --   --  2.6   GFR: Estimated Creatinine Clearance: 35.9 mL/min (A) (by C-G formula based on SCr of 1.11 mg/dL (H)). Liver Function Tests: Recent Labs  Lab 06/23/21 1459  AST 22  ALT 8  ALKPHOS 82  BILITOT 1.5*  PROT 7.7  ALBUMIN 3.4*   No results for input(s): LIPASE, AMYLASE in the last 168 hours. No results for input(s): AMMONIA in the last 168 hours. Coagulation Profile: No results for input(s): INR, PROTIME in the last 168 hours. Cardiac Enzymes: No results for input(s): CKTOTAL, CKMB, CKMBINDEX, TROPONINI in the last 168 hours. BNP (last 3 results) No results for input(s): PROBNP in the last 8760 hours. HbA1C: No results for input(s): HGBA1C in the last 72 hours. CBG: No results for input(s): GLUCAP in the last 168 hours. Lipid Profile: No results for input(s): CHOL, HDL, LDLCALC, TRIG, CHOLHDL, LDLDIRECT in the last 72 hours. Thyroid Function Tests: No results for input(s): TSH, T4TOTAL, FREET4, T3FREE, THYROIDAB in the  last 72 hours. Anemia Panel: No results for input(s): VITAMINB12, FOLATE, FERRITIN, TIBC, IRON, RETICCTPCT in the last 72 hours. Sepsis Labs: Recent Labs  Lab 06/23/21 1726  PROCALCITON <0.10    Recent Results (from the past 240 hour(s))  Resp Panel by RT-PCR (Flu A&B, Covid) Nasopharyngeal Swab     Status: None   Collection Time: 06/23/21  5:26 PM   Specimen: Nasopharyngeal Swab; Nasopharyngeal(NP) swabs in vial transport medium  Result Value Ref Range Status   SARS Coronavirus 2 by RT PCR NEGATIVE NEGATIVE Final    Comment: (NOTE) SARS-CoV-2 target nucleic acids are NOT DETECTED.  The SARS-CoV-2 RNA is  generally detectable in upper respiratory specimens during the acute phase of infection. The lowest concentration of SARS-CoV-2 viral copies this assay can detect is 138 copies/mL. A negative result does not preclude SARS-Cov-2 infection and should not be used as the sole basis for treatment or other patient management decisions. A negative result may occur with  improper specimen collection/handling, submission of specimen other than nasopharyngeal swab, presence of viral mutation(s) within the areas targeted by this assay, and inadequate number of viral copies(<138 copies/mL). A negative result must be combined with clinical observations, patient history, and epidemiological information. The expected result is Negative.  Fact Sheet for Patients:  EntrepreneurPulse.com.au  Fact Sheet for Healthcare Providers:  IncredibleEmployment.be  This test is no t yet approved or cleared by the [place] FDA and  has been authorized for detection and/or diagnosis of SARS-CoV-2 by FDA under an Emergency Use Authorization (EUA). This EUA will remain  in effect (meaning this test can be used) for the duration of the COVID-19 declaration under Section 564(b)(1) of the Act, 21 U.S.C.section 360bbb-3(b)(1), unless the authorization is terminated  or revoked sooner.       Influenza A by PCR NEGATIVE NEGATIVE Final   Influenza B by PCR NEGATIVE NEGATIVE Final    Comment: (NOTE) The Xpert Xpress SARS-CoV-2/FLU/RSV plus assay is intended as an aid in the diagnosis of influenza from Nasopharyngeal swab specimens and should not be used as a sole basis for treatment. Nasal washings and aspirates are unacceptable for Xpert Xpress SARS-CoV-2/FLU/RSV testing.  Fact Sheet for Patients: EntrepreneurPulse.com.au  Fact Sheet for Healthcare Providers: IncredibleEmployment.be  This test is not yet approved or cleared by the [place] FDA and has been authorized for detection and/or diagnosis of SARS-CoV-2 by FDA under an Emergency Use Authorization (EUA). This EUA will remain in effect (meaning this test can be used) for the duration of the COVID-19 declaration under Section 564(b)(1) of the Act, 21 U.S.C. section 360bbb-3(b)(1), unless the authorization is terminated or revoked.  Performed at [facility], [address] [place], [place] [address]          Radiology Studies: DG Chest 2 View  Result Date: 06/23/2021 CLINICAL DATA:  Shortness of breath. EXAM: CHEST - 2 VIEW COMPARISON:  May 19, 2021. FINDINGS: Stable cardiomediastinal silhouette. Status post cardiac valve repair. Right internal jugular Port-A-Cath is unchanged in position. Left-sided pacemaker is unchanged in position. Status post right shoulder arthroplasty. Stable bibasilar atelectasis or scarring is noted with small pleural effusions. Stable right perihilar opacity is noted most consistent with scarring or postoperative change. IMPRESSION: Stable bilateral lung opacities are noted compared to prior exam. Interval placement of right total shoulder arthroplasty. Electronically Signed   By: Marijo Conception M.D.   On: 06/23/2021 16:16        Scheduled Meds:  apixaban  2.5 mg  Oral BID   budesonide  0.5 mg Nebulization Daily   levothyroxine  88 mcg Oral Q0600   metoprolol succinate  50 mg Oral See admin instructions   potassium chloride  40 mEq Oral BID   revefenacin  175 mcg Nebulization Daily   rosuvastatin  5 mg Oral QHS   vitamin B-12  1,000 mcg Oral Daily   Continuous Infusions:  sodium chloride Stopped (06/23/21 2243)    Assessment & Plan:   Principal Problem:   Hypokalemia Active Problems:   Essential hypertension   Hypothyroidism (acquired)   Mitral regurgitation   HLD (hyperlipidemia)   Chronic obstructive pulmonary disease (HCC)   Hx of adenomatous colonic polyps   Atrial flutter (HCC)   PAF  (paroxysmal atrial fibrillation) (HCC)   Sinus node dysfunction (HCC)   Recurrent pleural effusion on right   Pulmonary hypertension, unspecified (HCC)   Chronic respiratory failure with hypoxia (HCC)   Squamous cell lung cancer (HCC)   Status post reverse arthroplasty of shoulder, right   Acute exacerbation of CHF (congestive heart failure) (HCC)   Troponin level elevated   # Severe hypokalemia -presumed secondary to acute kidney injury in setting of cardiorenal Replaced and now nml   Acute HFpEF Cards consulted BNP elevated Was given lasix . Good UOP Continue lasix 20mg  iv bid since still volume overloaded Monitor renal fxn and electrolytes   # Acute kidney injury-presumed secondary to cardiorenal Entresto held Continue to monitor   # Elevated troponin History of CAD/CABG- Likely due to demand ischemia due to to acute CHF.  No ischemic work-up needed.   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           LOS: 0 days   Time spent: 35 minutes with more than 50% on [place], MD [facility] Pager [phone]  If 7PM-7AM, please contact night-coverage 06/24/2021, 8:41 AM

## 2021-06-25 DIAGNOSIS — E119 Type 2 diabetes mellitus without complications: Secondary | ICD-10-CM | POA: Diagnosis present

## 2021-06-25 DIAGNOSIS — E876 Hypokalemia: Secondary | ICD-10-CM | POA: Diagnosis present

## 2021-06-25 DIAGNOSIS — J441 Chronic obstructive pulmonary disease with (acute) exacerbation: Secondary | ICD-10-CM | POA: Diagnosis not present

## 2021-06-25 DIAGNOSIS — I483 Typical atrial flutter: Secondary | ICD-10-CM | POA: Diagnosis not present

## 2021-06-25 DIAGNOSIS — I4892 Unspecified atrial flutter: Secondary | ICD-10-CM | POA: Diagnosis present

## 2021-06-25 DIAGNOSIS — I25118 Atherosclerotic heart disease of native coronary artery with other forms of angina pectoris: Secondary | ICD-10-CM | POA: Diagnosis present

## 2021-06-25 DIAGNOSIS — J9621 Acute and chronic respiratory failure with hypoxia: Secondary | ICD-10-CM | POA: Diagnosis present

## 2021-06-25 DIAGNOSIS — K7460 Unspecified cirrhosis of liver: Secondary | ICD-10-CM | POA: Diagnosis present

## 2021-06-25 DIAGNOSIS — I429 Cardiomyopathy, unspecified: Secondary | ICD-10-CM | POA: Diagnosis not present

## 2021-06-25 DIAGNOSIS — J449 Chronic obstructive pulmonary disease, unspecified: Secondary | ICD-10-CM | POA: Diagnosis present

## 2021-06-25 DIAGNOSIS — I081 Rheumatic disorders of both mitral and tricuspid valves: Secondary | ICD-10-CM | POA: Diagnosis present

## 2021-06-25 DIAGNOSIS — I509 Heart failure, unspecified: Secondary | ICD-10-CM

## 2021-06-25 DIAGNOSIS — I700 Atherosclerosis of aorta: Secondary | ICD-10-CM | POA: Diagnosis present

## 2021-06-25 DIAGNOSIS — E785 Hyperlipidemia, unspecified: Secondary | ICD-10-CM | POA: Diagnosis present

## 2021-06-25 DIAGNOSIS — I480 Paroxysmal atrial fibrillation: Secondary | ICD-10-CM | POA: Diagnosis present

## 2021-06-25 DIAGNOSIS — I5043 Acute on chronic combined systolic (congestive) and diastolic (congestive) heart failure: Secondary | ICD-10-CM | POA: Diagnosis present

## 2021-06-25 DIAGNOSIS — I255 Ischemic cardiomyopathy: Secondary | ICD-10-CM | POA: Diagnosis present

## 2021-06-25 DIAGNOSIS — Z20822 Contact with and (suspected) exposure to covid-19: Secondary | ICD-10-CM | POA: Diagnosis present

## 2021-06-25 DIAGNOSIS — D649 Anemia, unspecified: Secondary | ICD-10-CM | POA: Diagnosis present

## 2021-06-25 DIAGNOSIS — I420 Dilated cardiomyopathy: Secondary | ICD-10-CM | POA: Diagnosis present

## 2021-06-25 DIAGNOSIS — I495 Sick sinus syndrome: Secondary | ICD-10-CM | POA: Diagnosis present

## 2021-06-25 DIAGNOSIS — I248 Other forms of acute ischemic heart disease: Secondary | ICD-10-CM | POA: Diagnosis present

## 2021-06-25 DIAGNOSIS — I272 Pulmonary hypertension, unspecified: Secondary | ICD-10-CM | POA: Diagnosis present

## 2021-06-25 DIAGNOSIS — I10 Essential (primary) hypertension: Secondary | ICD-10-CM

## 2021-06-25 DIAGNOSIS — N179 Acute kidney failure, unspecified: Secondary | ICD-10-CM | POA: Diagnosis present

## 2021-06-25 DIAGNOSIS — I959 Hypotension, unspecified: Secondary | ICD-10-CM | POA: Diagnosis not present

## 2021-06-25 DIAGNOSIS — I4891 Unspecified atrial fibrillation: Secondary | ICD-10-CM | POA: Diagnosis not present

## 2021-06-25 DIAGNOSIS — I5033 Acute on chronic diastolic (congestive) heart failure: Secondary | ICD-10-CM | POA: Diagnosis not present

## 2021-06-25 DIAGNOSIS — E039 Hypothyroidism, unspecified: Secondary | ICD-10-CM | POA: Diagnosis present

## 2021-06-25 DIAGNOSIS — I110 Hypertensive heart disease with heart failure: Secondary | ICD-10-CM | POA: Diagnosis present

## 2021-06-25 DIAGNOSIS — R778 Other specified abnormalities of plasma proteins: Secondary | ICD-10-CM

## 2021-06-25 DIAGNOSIS — R55 Syncope and collapse: Secondary | ICD-10-CM | POA: Diagnosis present

## 2021-06-25 LAB — BASIC METABOLIC PANEL
Anion gap: 4 — ABNORMAL LOW (ref 5–15)
BUN: 12 mg/dL (ref 8–23)
CO2: 31 mmol/L (ref 22–32)
Calcium: 8.7 mg/dL — ABNORMAL LOW (ref 8.9–10.3)
Chloride: 102 mmol/L (ref 98–111)
Creatinine, Ser: 1.01 mg/dL — ABNORMAL HIGH (ref 0.44–1.00)
GFR, Estimated: 55 mL/min — ABNORMAL LOW (ref >60)
Glucose, Bld: 102 mg/dL — ABNORMAL HIGH (ref 70–99)
Potassium: 4.4 mmol/L (ref 3.5–5.1)
Sodium: 137 mmol/L (ref 135–145)

## 2021-06-25 LAB — APTT: aPTT: 37 seconds — ABNORMAL HIGH (ref 24–36)

## 2021-06-25 MED ORDER — POLYETHYLENE GLYCOL 3350 17 G PO PACK
17.0000 g | PACK | Freq: Every day | ORAL | Status: DC
  Administered 2021-06-25 – 2021-06-29 (×5): 17 g via ORAL
  Filled 2021-06-25 (×5): qty 1

## 2021-06-25 MED ORDER — OXYCODONE HCL 5 MG PO TABS
5.0000 mg | ORAL_TABLET | Freq: Four times a day (QID) | ORAL | Status: AC | PRN
  Administered 2021-06-25 – 2021-06-29 (×3): 5 mg via ORAL
  Filled 2021-06-25 (×3): qty 1

## 2021-06-25 MED ORDER — BISACODYL 10 MG RE SUPP
10.0000 mg | Freq: Once | RECTAL | Status: AC
  Administered 2021-06-25: 10 mg via RECTAL
  Filled 2021-06-25: qty 1

## 2021-06-25 MED ORDER — METOPROLOL SUCCINATE ER 25 MG PO TB24
25.0000 mg | ORAL_TABLET | Freq: Every day | ORAL | Status: DC
  Administered 2021-06-26: 25 mg via ORAL
  Filled 2021-06-25 (×2): qty 1

## 2021-06-25 MED ORDER — METOPROLOL SUCCINATE ER 25 MG PO TB24
12.5000 mg | ORAL_TABLET | Freq: Once | ORAL | Status: AC
  Administered 2021-06-25: 12.5 mg via ORAL
  Filled 2021-06-25: qty 0.5

## 2021-06-25 MED ORDER — HEPARIN (PORCINE) 25000 UT/250ML-% IV SOLN
750.0000 [IU]/h | INTRAVENOUS | Status: DC
  Administered 2021-06-25 – 2021-06-26 (×2): 1000 [IU]/h via INTRAVENOUS
  Filled 2021-06-25 (×2): qty 250

## 2021-06-25 MED ORDER — ACETAMINOPHEN 325 MG PO TABS
650.0000 mg | ORAL_TABLET | Freq: Four times a day (QID) | ORAL | Status: DC | PRN
  Administered 2021-06-26 – 2021-06-28 (×4): 650 mg via ORAL
  Filled 2021-06-25 (×4): qty 2

## 2021-06-25 MED ORDER — FLEET ENEMA 7-19 GM/118ML RE ENEM: 1.0000 | ENEMA | Freq: Once | RECTAL | Status: DC

## 2021-06-25 NOTE — ED Notes (Signed)
Pt transitioned to a hospital bed to promote comfort.  °

## 2021-06-25 NOTE — Progress Notes (Signed)
PROGRESS NOTE    Jenny Cooper  FOY:[id number] DOB: [birth year] DOA: 06/23/2021 PCP: Glean Hess, MD    Brief Narrative:  Jenny Cooper is a 85 y.o. female with medical history significant for paroxysmal atrial fibrillation, history of squamous cell lung carcinoma stage III, status post chemoradiation therapy in 2016, on chronic anticoagulation and amiodarone therapy, CAD status post CABG in 2002, former tobacco use, COPD, history of left lower extremity DVT, hyperlipidemia, hypertension, hypothyroid, ischemic cardiomyopathy, mitral regurgitation, neuropathy,.She was told to come to the ED for further evaluation due to the low potassium.    10/27 starting to feel better.  Complaining of constipation would like something for it.   Consultants:  Cardiology  Procedures:   Antimicrobials:      Subjective: Shortness of breath not at baseline.  No chest pain.  No dizziness.  Objective: Vitals:   06/25/21 0400 06/25/21 0500 06/25/21 0700 06/25/21 0827  BP: 122/71 (!) 109/49  117/77  Pulse: 99 100 100 100  Resp: 20 (!) 22 17 16   Temp:      TempSrc:      SpO2: 97% 99% 94% 94%    Intake/Output Summary (Last 24 hours) at 06/25/2021 0919 Last data filed at 06/25/2021 7672 Gross per 24 hour  Intake --  Output 800 ml  Net -800 ml   There were no vitals filed for this visit.  Examination: NAD, calm Decreased breath sounds at bases no wheezing Regular S1-S2 no gallops Soft benign positive bowel sounds No edema Awake alert and oriented x3 Mood and affect appropriate in current setting   Data Reviewed: I have personally reviewed following labs and imaging studies  CBC: Recent Labs  Lab 06/23/21 1103 06/23/21 1459 06/24/21 0627  WBC 6.9 6.4 5.2  NEUTROABS  --  4.5  --   HGB 11.5* 11.0* 11.0*  HCT 36.2 34.5* 35.6*  MCV 98.1 97.2 96.2  PLT 223 230 094   Basic Metabolic Panel: Recent Labs  Lab 06/23/21 1103 06/23/21 1459 06/24/21 0011 06/24/21 0627  06/25/21 0537  NA 138 137 137 136 137  K 2.9* 2.8* 4.3 3.7 4.4  CL 99 100 102 100 102  CO2 28 26 30 29 31   GLUCOSE 109* 124* 103* 94 102*  BUN 16 17 17 15 12   CREATININE 1.12* 1.16* 1.13* 1.11* 1.01*  CALCIUM 8.8* 8.8* 8.6* 8.7* 8.7*  MG  --  1.7  --  1.9  --   PHOS  --   --   --  2.6  --    GFR: Estimated Creatinine Clearance: 39.5 mL/min (A) (by C-G formula based on SCr of 1.01 mg/dL (H)). Liver Function Tests: Recent Labs  Lab 06/23/21 1459  AST 22  ALT 8  ALKPHOS 82  BILITOT 1.5*  PROT 7.7  ALBUMIN 3.4*   No results for input(s): LIPASE, AMYLASE in the last 168 hours. No results for input(s): AMMONIA in the last 168 hours. Coagulation Profile: No results for input(s): INR, PROTIME in the last 168 hours. Cardiac Enzymes: No results for input(s): CKTOTAL, CKMB, CKMBINDEX, TROPONINI in the last 168 hours. BNP (last 3 results) No results for input(s): PROBNP in the last 8760 hours. HbA1C: No results for input(s): HGBA1C in the last 72 hours. CBG: No results for input(s): GLUCAP in the last 168 hours. Lipid Profile: No results for input(s): CHOL, HDL, LDLCALC, TRIG, CHOLHDL, LDLDIRECT in the last 72 hours. Thyroid Function Tests: No results for input(s): TSH, T4TOTAL, FREET4, T3FREE, THYROIDAB in the  last 72 hours. Anemia Panel: No results for input(s): VITAMINB12, FOLATE, FERRITIN, TIBC, IRON, RETICCTPCT in the last 72 hours. Sepsis Labs: Recent Labs  Lab 06/23/21 1726  PROCALCITON <0.10    Recent Results (from the past 240 hour(s))  Resp Panel by RT-PCR (Flu A&B, Covid) Nasopharyngeal Swab     Status: None   Collection Time: 06/23/21  5:26 PM   Specimen: Nasopharyngeal Swab; Nasopharyngeal(NP) swabs in vial transport medium  Result Value Ref Range Status   SARS Coronavirus 2 by RT PCR NEGATIVE NEGATIVE Final    Comment: (NOTE) SARS-CoV-2 target nucleic acids are NOT DETECTED.  The SARS-CoV-2 RNA is generally detectable in upper respiratory specimens  during the acute phase of infection. The lowest concentration of SARS-CoV-2 viral copies this assay can detect is 138 copies/mL. A negative result does not preclude SARS-Cov-2 infection and should not be used as the sole basis for treatment or other patient management decisions. A negative result may occur with  improper specimen collection/handling, submission of specimen other than nasopharyngeal swab, presence of viral mutation(s) within the areas targeted by this assay, and inadequate number of viral copies(<138 copies/mL). A negative result must be combined with clinical observations, patient history, and epidemiological information. The expected result is Negative.  Fact Sheet for Patients:  EntrepreneurPulse.com.au  Fact Sheet for Healthcare Providers:  IncredibleEmployment.be  This test is no t yet approved or cleared by the [place] FDA and  has been authorized for detection and/or diagnosis of SARS-CoV-2 by FDA under an Emergency Use Authorization (EUA). This EUA will remain  in effect (meaning this test can be used) for the duration of the COVID-19 declaration under Section 564(b)(1) of the Act, 21 U.S.C.section 360bbb-3(b)(1), unless the authorization is terminated  or revoked sooner.       Influenza A by PCR NEGATIVE NEGATIVE Final   Influenza B by PCR NEGATIVE NEGATIVE Final    Comment: (NOTE) The Xpert Xpress SARS-CoV-2/FLU/RSV plus assay is intended as an aid in the diagnosis of influenza from Nasopharyngeal swab specimens and should not be used as a sole basis for treatment. Nasal washings and aspirates are unacceptable for Xpert Xpress SARS-CoV-2/FLU/RSV testing.  Fact Sheet for Patients: EntrepreneurPulse.com.au  Fact Sheet for Healthcare Providers: IncredibleEmployment.be  This test is not yet approved or cleared by the [place] FDA and has been authorized for detection  and/or diagnosis of SARS-CoV-2 by FDA under an Emergency Use Authorization (EUA). This EUA will remain in effect (meaning this test can be used) for the duration of the COVID-19 declaration under Section 564(b)(1) of the Act, 21 U.S.C. section 360bbb-3(b)(1), unless the authorization is terminated or revoked.  Performed at [facility], [address] [place], [place] [address]          Radiology Studies: DG Chest 2 View  Result Date: 06/23/2021 CLINICAL DATA:  Shortness of breath. EXAM: CHEST - 2 VIEW COMPARISON:  May 19, 2021. FINDINGS: Stable cardiomediastinal silhouette. Status post cardiac valve repair. Right internal jugular Port-A-Cath is unchanged in position. Left-sided pacemaker is unchanged in position. Status post right shoulder arthroplasty. Stable bibasilar atelectasis or scarring is noted with small pleural effusions. Stable right perihilar opacity is noted most consistent with scarring or postoperative change. IMPRESSION: Stable bilateral lung opacities are noted compared to prior exam. Interval placement of right total shoulder arthroplasty. Electronically Signed   By: Marijo Conception M.D.   On: 06/23/2021 16:16   ECHOCARDIOGRAM COMPLETE  Result Date: 06/24/2021    ECHOCARDIOGRAM REPORT  Patient Name:   Jenny Cooper Date of Exam: 06/24/2021 Medical Rec #:  [id number]        Height:       66.0 in Accession #:    [id number]       Weight:       148.0 lb Date of Birth:  [birth year]        BSA:          1.760 m Patient Age:    74 years         BP:           108/63 mmHg Patient Gender: F                HR:           96 bpm. Exam Location:  [facility] Procedure: 2D Echo, Color Doppler, Cardiac Doppler and Intracardiac            Opacification Agent Indications:     R06.00 Dyspnea  History:         Patient has prior history of Echocardiogram examinations. CHF,                  CAD, Prior CABG and Pacemaker, COPD; Risk Factors:Diabetes,                  Hypertension and  Dyslipidemia.  Sonographer:     Charmayne Sheer Referring Phys:  [id number] AMY N COX Diagnosing Phys: Kate Sable MD  Sonographer Comments: Technically difficult study due to poor echo windows. IMPRESSIONS  1. Left ventricular ejection fraction, by estimation, is 40 to 45%. The left ventricle has mild to moderately decreased function. The left ventricle demonstrates global hypokinesis. Left ventricular diastolic parameters are indeterminate.  2. Right ventricular systolic function is low normal. The right ventricular size is normal. There is moderately elevated pulmonary artery systolic pressure.  3. The mitral valve is degenerative. Trivial mitral valve regurgitation. Moderate mitral annular calcification.  4. The aortic valve was not well visualized. Aortic valve regurgitation is not visualized.  5. The inferior vena cava is dilated in size with <50% respiratory variability, suggesting right atrial pressure of 15 mmHg. FINDINGS  Left Ventricle: Left ventricular ejection fraction, by estimation, is 40 to 45%. The left ventricle has mild to moderately decreased function. The left ventricle demonstrates global hypokinesis. Definity contrast agent was given IV to delineate the left  ventricular endocardial borders. The left ventricular internal cavity size was normal in size. There is no left ventricular hypertrophy. Left ventricular diastolic parameters are indeterminate. Right Ventricle: The right ventricular size is normal. No increase in right ventricular wall thickness. Right ventricular systolic function is low normal. There is moderately elevated pulmonary artery systolic pressure. The tricuspid regurgitant velocity  is 3.20 m/s, and with an assumed right atrial pressure of 15 mmHg, the estimated right ventricular systolic pressure is 58.0 mmHg. Left Atrium: Left atrial size was normal in size. Right Atrium: Right atrial size was normal in size. Pericardium: There is no evidence of pericardial effusion. Mitral  Valve: The mitral valve is degenerative in appearance. Moderate mitral annular calcification. Trivial mitral valve regurgitation. MV peak gradient, 8.1 mmHg. The mean mitral valve gradient is 3.0 mmHg. Tricuspid Valve: The tricuspid valve is normal in structure. Tricuspid valve regurgitation is mild. Aortic Valve: The aortic valve was not well visualized. Aortic valve regurgitation is not visualized. Aortic valve mean gradient measures 3.0 mmHg. Aortic valve peak gradient measures 5.8 mmHg. Aortic valve area, by VTI measures  1.11 cm. Pulmonic Valve: The pulmonic valve was not well visualized. Pulmonic valve regurgitation is not visualized. Aorta: The aortic root is normal in size and structure. Venous: The inferior vena cava is dilated in size with less than 50% respiratory variability, suggesting right atrial pressure of 15 mmHg. IAS/Shunts: No atrial level shunt detected by color flow Doppler. Additional Comments: A device lead is visualized.  LEFT VENTRICLE PLAX 2D LVIDd:         3.61 cm   Diastology LVIDs:         2.86 cm   LV e' lateral:   5.98 cm/s LV PW:         0.90 cm   LV E/e' lateral: 17.4 LV IVS:        0.71 cm LVOT diam:     1.60 cm LV SV:         22 LV SV Index:   12 LVOT Area:     2.01 cm  LEFT ATRIUM         Index LA diam:    4.30 cm 2.44 cm/m  AORTIC VALVE                    PULMONIC VALVE AV Area (Vmax):    0.97 cm     PV Vmax:       0.78 m/s AV Area (Vmean):   0.96 cm     PV Vmean:      48.600 cm/s AV Area (VTI):     1.11 cm     PV VTI:        0.106 m AV Vmax:           120.00 cm/s  PV Peak grad:  2.4 mmHg AV Vmean:          79.200 cm/s  PV Mean grad:  1.0 mmHg AV VTI:            0.193 m AV Peak Grad:      5.8 mmHg AV Mean Grad:      3.0 mmHg LVOT Vmax:         58.10 cm/s LVOT Vmean:        38.000 cm/s LVOT VTI:          0.107 m LVOT/AV VTI ratio: 0.55  AORTA Ao Root diam: 2.70 cm MITRAL VALVE                TRICUSPID VALVE MV Area (PHT): 4.39 cm     TR Peak grad:   41.0 mmHg MV Area VTI:    1.01 cm     TR Vmax:        320.00 cm/s MV Peak grad:  8.1 mmHg MV Mean grad:  3.0 mmHg     SHUNTS MV Vmax:       1.42 m/s     Systemic VTI:  0.11 m MV Vmean:      78.3 cm/s    Systemic Diam: 1.60 cm MV Decel Time: 173 msec MV E velocity: 104.00 cm/s Kate Sable MD Electronically signed by Kate Sable MD Signature Date/Time: 06/24/2021/5:26:25 PM    Final         Scheduled Meds:  apixaban  2.5 mg Oral BID   budesonide  0.5 mg Nebulization Daily   furosemide  20 mg Intravenous BID   levothyroxine  88 mcg Oral Q0600   metoprolol succinate  12.5 mg Oral Daily   potassium chloride  40 mEq Oral BID   revefenacin  175 mcg Nebulization Daily   rosuvastatin  5 mg Oral QHS   vitamin B-12  1,000 mcg Oral Daily   Continuous Infusions:  sodium chloride Stopped (06/23/21 2243)    Assessment & Plan:   Principal Problem:   Hypokalemia Active Problems:   Essential hypertension   Hypothyroidism (acquired)   Mitral regurgitation   HLD (hyperlipidemia)   Chronic obstructive pulmonary disease (HCC)   Hx of adenomatous colonic polyps   Atrial flutter (HCC)   PAF (paroxysmal atrial fibrillation) (HCC)   Sinus node dysfunction (HCC)   Recurrent pleural effusion on right   Pulmonary hypertension, unspecified (HCC)   Chronic respiratory failure with hypoxia (HCC)   Squamous cell lung cancer (HCC)   Status post reverse arthroplasty of shoulder, right   Acute exacerbation of CHF (congestive heart failure) (HCC)   Troponin level elevated   CHF (congestive heart failure) ([place])   # Severe hypokalemia -presumed secondary to acute kidney injury in setting of cardiorenal 10/27 potassium stable after replacement   Acute HFpEF Cardiology following.   Echo EF 40 to 45% Continue IV Lasix     # Acute kidney injury-presumed secondary to cardiorenal Continue to hold Entresto.  Creatinine improving   # Elevated troponin History of CAD/CABG- Likely due to demand ischemia due to to  acute CHF.   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           LOS: 0 days   Time spent: 35 minutes with more than 50% on [place], MD [facility] Pager [phone]  If 7PM-7AM, please contact night-coverage 06/25/2021, 9:19 AM

## 2021-06-25 NOTE — Consult Note (Signed)
ANTICOAGULATION CONSULT NOTE - Initial Consult  Pharmacy Consult for Heparin infusion Indication: atrial fibrillation  Allergies  Allergen Reactions   Cefdinir Rash    Had hives after completing treatment - unsure if it was the cause.  Has received 1st generation cephalosporin many times (04/24/2012, 07/27/2013, 09/28/2016, 02/20/2018, 07/11/2018, 08/10/2018 without documented ADRs.   Lovenox [Enoxaparin Sodium] Itching   Meperidine Other (See Comments)    Other Reaction: pt does not like how it makes her feel Other reaction(s): Other (See Comments) Other Reaction: pt does not like how it makes her feel    Patient Measurements:   Heparin Dosing Weight: 67.1 kg   Vital Signs: BP: 117/77 (10/27 0827) Pulse Rate: 100 (10/27 0827)  Labs: Recent Labs    06/23/21 1103 06/23/21 1459 06/23/21 1726 06/24/21 0011 06/24/21 0627 06/25/21 0537  HGB 11.5* 11.0*  --   --  11.0*  --   HCT 36.2 34.5*  --   --  35.6*  --   PLT 223 230  --   --  215  --   CREATININE 1.12* 1.16*  --  1.13* 1.11* 1.01*  TROPONINIHS  --  26* 29*  --  26*  --     Estimated Creatinine Clearance: 39.5 mL/min (A) (by C-G formula based on SCr of 1.01 mg/dL (H)).   Medical History: Past Medical History:  Diagnosis Date   Acute midline low back pain without sciatica 08/13/2016   Acute on chronic respiratory failure with hypoxia ([place]) 08/21/2018   Acute respiratory failure with hypoxia (HCC) 12/11/2017   Allergy    Aortic atherosclerosis (HCC)    Atypical atrial flutter ([place])    a.) s/p ablation 07/27/2013 and 05/10/2016   Balance problem    CAD (coronary artery disease)    a. s/p MI x 2 in 2002 s/p PCI x 2 in 2002; b. s/p 2v CABG 2002; c. stress echo 07/2004 w/ evi of pos & inf infarct & no evi of ischemia; d. 4/08 dipyridamole scan w/ multiple areas of infarct, no ischemia, EF 49%; e. cath 04/28/15 3v CAD, med Rx rec, no targets for revasc, LM lum irregs, pLAD 30%, 100%, ost-pLCx 60%, [name] 99%, OM2 100%,  p-mRCA 90%, [name] 100% L-R collats, VG-mLAD irregs, VG-OM2 oc   Chronic anticoagulation    a.) Apixaban   Chronic systolic CHF (congestive heart failure) ([place])    a. echo 03/2015: EF 30-35%, sev ant/inf/pos HK, in mild to mod MR   Complication of anesthesia    a.) postoperative apnea. b.) postoperative hypoxia.   Compressed spine fracture ([place]) 08/06/2016   a.) L2, T11, T12   COPD (chronic obstructive pulmonary disease) (HCC)    Deep vein thrombosis (DVT) of left lower extremity ([place]) 12/2001   Fracture of multiple pubic rami, right, closed, initial encounter ([place]) 05/27/2016   a.) RIGHT superior and inferior pubic rami fractures s/p mechanical fall.   GERD (gastroesophageal reflux disease)    HLD (hyperlipidemia)    HTN (hypertension)    Hypothyroidism    Ischemic cardiomyopathy    Mitral regurgitation    a. s/p mitral ring placement 09/2000; b. echo 09/2010: EF 50%, inf HK, post AK, mild MR, prosthetic mitral valve ring w/ peak gradient of 10 mmHg; b. echo 2/13: EF 50%, mild MR/TR      Myocardial infarction ([place]) 2002   a.) x 2 in 2002; PCI with stents (unknown type) placed to the p-mRCA, [name], and [name].   Neuropathy    Osteoarthritis  Osteoporosis    Oxygen dependent    a.) 3 L/[place]   Pacemaker    a. MDT 2002; b. generator replacement 2013; c. followed by Dr. Omelia Blackwater, MD   PAF (paroxysmal atrial fibrillation) [facility])    a.) s/p DCCV on 04/08/2017. b.) on apixaban   Personal history of chemotherapy    Personal history of radiation therapy    Pneumonia    S/P CABG x 2 2002   a.) SVG-LAD, SVG-OM   Squamous cell carcinoma of right lung ([place]) 01/03/2015   a.) stage IIIa. b.) s/p concurrent chemotherapy (carboplatin + paclitaxel) and XRT (IMRT 6000 cGy)   SVT (supraventricular tachycardia) (HCC)    Type II diabetes mellitus with complication ([place]) 32/44/0102    Medications:  Apixaban 2.5 mg BID: Last dose 10/27 0930  Assessment: 85 y.o. female with a history of paroxysmal  atrial fibrillation/flutter on chronic apixaban presented to ED with SOB. Pharmacy has been consulted to transition to heparin therapy for possible further cardiac workup.   Goal of Therapy:  Heparin level 0.3-0.7 units/ml aPTT 66-102 seconds Monitor platelets by anticoagulation protocol: Yes   Plan:  Start heparin infusion at 1000 units/hr tonight 12 hours following last apixaban dose w/o bolus Check anti-Xa/aPTT level in 8 hours and daily while on heparin Anticipate monitoring via aPTT levels since recent DOAC. Transition to heparin level monitoring once aPTT and HL correlate  Continue to monitor H&H and platelets  Dorothe Pea, PharmD, BCPS Clinical Pharmacist   06/25/2021,10:07 AM

## 2021-06-25 NOTE — ED Notes (Signed)
Pt ambulatory to the [facility] without assistance - Pt had a large BM.

## 2021-06-25 NOTE — Progress Notes (Addendum)
Progress Note  Patient Name: Jenny Cooper Date of Encounter: 06/25/2021  Primary Cardiologist: Ida Rogue, MD   Subjective   Reports significant improvement in symptoms since presenting to the ED and with IV diuresis and K repletion.  She has improvement and dizziness, breathing, and fatigue.  No chest pain.  Her breathing is stable on 4 L nasal cannula oxygen, improved from previous 6 L.  She reports constipation.  Will send message to Dr. Kurtis Bushman.  Echo performed with EF 40-45%, LV global hypokinesis, moderately elevated PASP, degenerative mitral valve/trivial MR with calcification. RAP 67mmHg.  Inpatient Medications    Scheduled Meds:  apixaban  2.5 mg Oral BID   budesonide  0.5 mg Nebulization Daily   furosemide  20 mg Intravenous BID   levothyroxine  88 mcg Oral Q0600   metoprolol succinate  12.5 mg Oral Daily   potassium chloride  40 mEq Oral BID   revefenacin  175 mcg Nebulization Daily   rosuvastatin  5 mg Oral QHS   vitamin B-12  1,000 mcg Oral Daily   Continuous Infusions:  sodium chloride Stopped (06/23/21 2243)   PRN Meds: sodium chloride, acetaminophen **OR** acetaminophen, fluticasone, ipratropium-albuterol, ondansetron **OR** ondansetron (ZOFRAN) IV   Vital Signs    Vitals:   06/25/21 0400 06/25/21 0500 06/25/21 0700 06/25/21 0827  BP: 122/71 (!) 109/49  117/77  Pulse: 99 100 100 100  Resp: 20 (!) 22 17 16   Temp:      TempSrc:      SpO2: 97% 99% 94% 94%    Intake/Output Summary (Last 24 hours) at 06/25/2021 8676 Last data filed at 06/25/2021 1950 Gross per 24 hour  Intake --  Output 800 ml  Net -800 ml   Last 3 Weights 06/23/2021 06/02/2021 05/19/2021  Weight (lbs) 148 lb 150 lb 149 lb 14.6 oz  Weight (kg) 67.132 kg 68.04 kg 68 kg      Telemetry    Afib with rates in the low 100s - Personally Reviewed  ECG    No new tracings - Personally Reviewed  Physical Exam   GEN: Elderly female. No acute distress.  Joined by her  daughter Neck: JVD difficult to assess due to position of patient Cardiac: IRIR and tachycardic, no murmurs, rubs, or gallops.  Respiratory: Bilateral wheezing with reduced breath sounds bilaterally throughout anterior auscultation only. GI: Soft, nontender, non-distended  MS: No edema; No deformity. Neuro:  Nonfocal  Psych: Normal affect   Labs    High Sensitivity Troponin:   Recent Labs  Lab 06/23/21 1459 06/23/21 1726 06/24/21 0627  TROPONINIHS 26* 29* 26*      Chemistry Recent Labs  Lab 06/23/21 1459 06/24/21 0011 06/24/21 0627 06/25/21 0537  NA 137 137 136 137  K 2.8* 4.3 3.7 4.4  CL 100 102 100 102  CO2 26 30 29 31   GLUCOSE 124* 103* 94 102*  BUN 17 17 15 12   CREATININE 1.16* 1.13* 1.11* 1.01*  CALCIUM 8.8* 8.6* 8.7* 8.7*  PROT 7.7  --   --   --   ALBUMIN 3.4*  --   --   --   AST 22  --   --   --   ALT 8  --   --   --   ALKPHOS 82  --   --   --   BILITOT 1.5*  --   --   --   GFRNONAA 47* 48* 49* 55*  ANIONGAP 11 5 7  4*  Hematology Recent Labs  Lab 06/23/21 1103 06/23/21 1459 06/24/21 0627  WBC 6.9 6.4 5.2  RBC 3.69* 3.55* 3.70*  HGB 11.5* 11.0* 11.0*  HCT 36.2 34.5* 35.6*  MCV 98.1 97.2 96.2  MCH 31.2 31.0 29.7  MCHC 31.8 31.9 30.9  RDW 17.8* 17.7* 17.9*  PLT 223 230 215    BNP Recent Labs  Lab 06/23/21 1103 06/23/21 1459  BNP 2,164.2* 2,537.6*     DDimer No results for input(s): DDIMER in the last 168 hours.   Radiology    DG Chest 2 View  Result Date: 06/23/2021 CLINICAL DATA:  Shortness of breath. EXAM: CHEST - 2 VIEW COMPARISON:  May 19, 2021. FINDINGS: Stable cardiomediastinal silhouette. Status post cardiac valve repair. Right internal jugular Port-A-Cath is unchanged in position. Left-sided pacemaker is unchanged in position. Status post right shoulder arthroplasty. Stable bibasilar atelectasis or scarring is noted with small pleural effusions. Stable right perihilar opacity is noted most consistent with scarring or  postoperative change. IMPRESSION: Stable bilateral lung opacities are noted compared to prior exam. Interval placement of right total shoulder arthroplasty. Electronically Signed   By: Marijo Conception M.D.   On: 06/23/2021 16:16   ECHOCARDIOGRAM COMPLETE  Result Date: 06/24/2021    ECHOCARDIOGRAM REPORT   Patient Name:   Jenny Cooper Date of Exam: 06/24/2021 Medical Rec #:  [id number]        Height:       66.0 in Accession #:    [id number]       Weight:       148.0 lb Date of Birth:  09/19/1935        BSA:          1.760 m Patient Age:    85 years         BP:           108/63 mmHg Patient Gender: F                HR:           96 bpm. Exam Location:  [facility] Procedure: 2D Echo, Color Doppler, Cardiac Doppler and Intracardiac            Opacification Agent Indications:     R06.00 Dyspnea  History:         Patient has prior history of Echocardiogram examinations. CHF,                  CAD, Prior CABG and Pacemaker, COPD; Risk Factors:Diabetes,                  Hypertension and Dyslipidemia.  Sonographer:     Charmayne Sheer Referring Phys:  [id number] AMY N COX Diagnosing Phys: Kate Sable MD  Sonographer Comments: Technically difficult study due to poor echo windows. IMPRESSIONS  1. Left ventricular ejection fraction, by estimation, is 40 to 45%. The left ventricle has mild to moderately decreased function. The left ventricle demonstrates global hypokinesis. Left ventricular diastolic parameters are indeterminate.  2. Right ventricular systolic function is low normal. The right ventricular size is normal. There is moderately elevated pulmonary artery systolic pressure.  3. The mitral valve is degenerative. Trivial mitral valve regurgitation. Moderate mitral annular calcification.  4. The aortic valve was not well visualized. Aortic valve regurgitation is not visualized.  5. The inferior vena cava is dilated in size with <50% respiratory variability, suggesting right atrial pressure of 15 mmHg. FINDINGS  Left  Ventricle: Left ventricular ejection fraction, by estimation,  is 40 to 45%. The left ventricle has mild to moderately decreased function. The left ventricle demonstrates global hypokinesis. Definity contrast agent was given IV to delineate the left  ventricular endocardial borders. The left ventricular internal cavity size was normal in size. There is no left ventricular hypertrophy. Left ventricular diastolic parameters are indeterminate. Right Ventricle: The right ventricular size is normal. No increase in right ventricular wall thickness. Right ventricular systolic function is low normal. There is moderately elevated pulmonary artery systolic pressure. The tricuspid regurgitant velocity  is 3.20 m/s, and with an assumed right atrial pressure of 15 mmHg, the estimated right ventricular systolic pressure is 88.8 mmHg. Left Atrium: Left atrial size was normal in size. Right Atrium: Right atrial size was normal in size. Pericardium: There is no evidence of pericardial effusion. Mitral Valve: The mitral valve is degenerative in appearance. Moderate mitral annular calcification. Trivial mitral valve regurgitation. MV peak gradient, 8.1 mmHg. The mean mitral valve gradient is 3.0 mmHg. Tricuspid Valve: The tricuspid valve is normal in structure. Tricuspid valve regurgitation is mild. Aortic Valve: The aortic valve was not well visualized. Aortic valve regurgitation is not visualized. Aortic valve mean gradient measures 3.0 mmHg. Aortic valve peak gradient measures 5.8 mmHg. Aortic valve area, by VTI measures 1.11 cm. Pulmonic Valve: The pulmonic valve was not well visualized. Pulmonic valve regurgitation is not visualized. Aorta: The aortic root is normal in size and structure. Venous: The inferior vena cava is dilated in size with less than 50% respiratory variability, suggesting right atrial pressure of 15 mmHg. IAS/Shunts: No atrial level shunt detected by color flow Doppler. Additional Comments: A device lead is  visualized.  LEFT VENTRICLE PLAX 2D LVIDd:         3.61 cm   Diastology LVIDs:         2.86 cm   LV e' lateral:   5.98 cm/s LV PW:         0.90 cm   LV E/e' lateral: 17.4 LV IVS:        0.71 cm LVOT diam:     1.60 cm LV SV:         22 LV SV Index:   12 LVOT Area:     2.01 cm  LEFT ATRIUM         Index LA diam:    4.30 cm 2.44 cm/m  AORTIC VALVE                    PULMONIC VALVE AV Area (Vmax):    0.97 cm     PV Vmax:       0.78 m/s AV Area (Vmean):   0.96 cm     PV Vmean:      48.600 cm/s AV Area (VTI):     1.11 cm     PV VTI:        0.106 m AV Vmax:           120.00 cm/s  PV Peak grad:  2.4 mmHg AV Vmean:          79.200 cm/s  PV Mean grad:  1.0 mmHg AV VTI:            0.193 m AV Peak Grad:      5.8 mmHg AV Mean Grad:      3.0 mmHg LVOT Vmax:         58.10 cm/s LVOT Vmean:        38.000 cm/s LVOT VTI:  0.107 m LVOT/AV VTI ratio: 0.55  AORTA Ao Root diam: 2.70 cm MITRAL VALVE                TRICUSPID VALVE MV Area (PHT): 4.39 cm     TR Peak grad:   41.0 mmHg MV Area VTI:   1.01 cm     TR Vmax:        320.00 cm/s MV Peak grad:  8.1 mmHg MV Mean grad:  3.0 mmHg     SHUNTS MV Vmax:       1.42 m/s     Systemic VTI:  0.11 m MV Vmean:      78.3 cm/s    Systemic Diam: 1.60 cm MV Decel Time: 173 msec MV E velocity: 104.00 cm/s Kate Sable MD Electronically signed by Kate Sable MD Signature Date/Time: 06/24/2021/5:26:25 PM    Final     Cardiac Studies   Echo 06/23/21  1. Left ventricular ejection fraction, by estimation, is 40 to 45%. The  left ventricle has mild to moderately decreased function. The left  ventricle demonstrates global hypokinesis. Left ventricular diastolic  parameters are indeterminate.   2. Right ventricular systolic function is low normal. The right  ventricular size is normal. There is moderately elevated pulmonary artery  systolic pressure.   3. The mitral valve is degenerative. Trivial mitral valve regurgitation.  Moderate mitral annular calcification.   4.  The aortic valve was not well visualized. Aortic valve regurgitation  is not visualized.   5. The inferior vena cava is dilated in size with <50% respiratory  variability, suggesting right atrial pressure of 15 mmHg. Echocardiogram 03/06/2020  1. Left ventricular ejection fraction, by estimation, is 50 to 55%. The  left ventricle has low normal function. The left ventricle has no regional  wall motion abnormalities. Left ventricular diastolic parameters are  indeterminate.   2. Right ventricular systolic function is normal. The right ventricular  size is normal. There is moderately elevated pulmonary artery systolic  pressure. The estimated right ventricular systolic pressure is 58.0 mmHg.   3. Mild mitral valve regurgitation.   4. Aortic valve regurgitation is mild.    LHC  04/28/15 Prox LAD-2 lesion, 100% stenosed. Prox LAD-1 lesion, 30% stenosed. Ost Cx to Prox Cx lesion, 60% stenosed. Mid Cx lesion, 99% stenosed. The lesion was previously treated with a stent (unknown type) greater than two years ago. Ost 2nd Mrg to 2nd Mrg lesion, 100% stenosed. SVG was injected . Origin lesion, 100% stenosed. Prox RCA to Mid RCA lesion, 90% stenosed. The lesion was previously treated with a stent (unknown type) greater than two years ago. Mid RCA to Dist RCA lesion, 100% stenosed. The lesion was previously treated with a stent (unknown type) . SVG was injected . The graft exhibits minimal luminal irregularities. The left ventricular systolic function is normal. 1. Severe underlying three-vessel coronary artery disease with patent SVG to LAD. Occluded SVG likely to OM. The native RCA has severe in-stent restenosis in the midsegment and is occluded distally at the sites of previously placed stents. There are left to right collaterals. 2. Moderately reduced LV systolic function with an ejection fraction of 35-40%. 3. Mildly dilated left ventricular end-diastolic pressure. Recommendations: There are  no good options for revascularization. The native RCA is chronically occluded with left-to-right collaterals. The native left circumflex has diffuse disease and overall small in caliber. I recommend optimizing medical therapy for CAD and ischemic cardiomyopathy. I discontinued aspirin given that the patient is on anticoagulation.  Wall Motion                  Left Heart   Left Ventricle The left ventricular size is normal.  The left ventricular systolic function is normal.  The ejection fraction could not be assess due to 35-40.  There are wall motion abnormalities in the left ventricle.   There are segmental wall motion abnormalities in the left ventricle.  Aortic Valve There is no aortic valve stenosis, and mild (2+) aortic regurgitation.    Coronary Diagrams   Diagnostic Dominance: Right Intervention   Monitor 03/2015 Monitor showed 50% Afib burden with peak rate of 116, mostly rate controlled. She would likely do better with decreased Afib burden. Would ideally like to attempt to titrate up her metoprolol first, if BP allows. If BP does not allow for this then may have to use amiodarone. First try to go up on the Toprol to 50 mg bid. If this drops her BP too low then we would likely need to go back down to 25 mg bid and start amiodarone for rhythm control. I also see that she has asked for a refill of her Toprol, please send this in. Follow up as planned.   MPI 03/2015 T wave inversion was noted during stress in the II, III and V4 leads. Defect 1: There is a large defect of severe severity present in the basal inferolateral and mid inferolateral location. Findings consistent with prior myocardial infarction with peri-infarct ischemia. This is a high risk study. The left ventricular ejection fraction is severely decreased (<30%).   Patient Profile     85 y.o. female  with history of COPD on home oxygen, former tobacco use, stage III squamous cell lung carcinoma s/p chemoradiation  therapy 2355, diastolic heart failure, hypertension, paroxysmal atrial fibrillation/flutter on chronic anticoagulation and amiodarone therapy, CAD s/p CABG, PPM, and who presents today after sent from the office due to volume overload with need for IV diuresis that was precluded by hypotension and significant hypokalemia with need for repletion.  Assessment & Plan   Acute on chronic HFmrEF Pulmonary hypertension Dilated cardiomyopathy --Reports improved sx with IV diuresis.  Significantly volume overloaded in clinic with diuresis precluded by subsequent labs showing significant hypokalemia, as well as hypotension at time of visit. At presentation, BNP 2537.6.   Previous echo EF 50 to 55%, NRWMA, RVSP 49.9 mmHg.  Most recent echo with EF reduced from previous at 40-45% and LV global hypokinesis. She remains volume up today.  Continue IV diuresis and monitoring of I's/O's, daily weights.  Once euvolemic on exam, recommend further ischemic workup of reduced EF with consideration of R/LHC. In this setting, recommend transition from Eliquis to IV heparin to reduce the risk of bleeding and in the event any procedures or intervention needed this admission. CHF education.  Before discharge, and as below, reconsider reduced dose Eliquis if needed. Continue low dose Toprol.  At presentation, Jenny Cooper held for hypotension and AKI with recommendation for restart once room in  BP and improvement in renal function.  Recent hypokalemia --Resolved s/p repletion.  Initial potassium 2.8.  Magnesium WNL.  Continue to monitor electrolytes with IV diuresis and ensure potassium repletion at discharge.    Paroxysmal atrial fibrillation/flutter --Currently in Afib, likely exacerbated by volume status. Reports occasional tachypalpitations and dizziness, though improved since presenting to the ED. current ventricular rates in the low 100s.  Goal ventricular rate less than 110 resting.  Continue current low dose Toprol for rate  control and escalate as tolerated by BP.  On reduced dose Eliquis 2.5 mg twice daily but does not meet 2 of 3 criteria as currently only meets criteria of age over 49yo (does not meet criteria of weight below 60 kg or Cr above 1.5). As above, given does not meet criteria and in the event that procedures needed this admission and to reduce the risk of bleeding, will change to IV heparin.  If pt continues to not meet criteria by wt / Cr once euvolemic and prior to discharge, recommend increasing back to full dose Eliquis 5 mg twice daily before leaving the hospital.  Of note, in the OP setting, she is followed by [facility] with recommendation for follow-up after this admission.  Elevated HS Tn Coronary artery disease s/p CABG --No CP. Suspect elevation of HS Tn due to supply demand ischemia in the setting of volume overload and comorbids above.  HS Tn minimally elevated, flat trending. Presentation not consistent with ACS. Updated echo shows EF reduced from previous and LV global hypokinesis, discussed with pt. Will discuss with MD as presentation not consistent with ACS, though could also consider R/LHC this admission s/p diuresis and once euvolemic versus repeat echo s/p back in NSR and following diuresis to confirm EF. Previous 2016 catheterization as above with severe three-vessel CAD with patent SVG to LAD and occluded SVG likely to OM.  Native RCA with severe mid segment in-stent restenosis and occluded distally at the sites of previous placed stents with left to right collaterals.  Will transition Eliquis to IV heparin as above and in the event catheterization / intervention needed. As above, before discharge, reconsider Eliquis reduced dosing if indicated as currently only meets criteria for age. Continue Toprol and statin.   Presyncope --Reports improved dizziness. BP improved from clinic and K now at goal. Carotids ordered in the OP setting / clinic, and can likely be discontinued if resolution of  dizziness with diuresis as no bruit appreciated on exam today.    Recent fall/R arm fx --Previous fall that fractured her right arm.  Reports improvement in pain.  Her daughter will do her physical therapy exercises with her this admission.  Continue to monitor.  She does report some constipation 2/2 opioids with message sent to IM regarding constipation-will defer to IM.  Constipation --Recommendations per IM. Sent msg.   Essential hypertension --Continue current medications.   Chronic obstructive pulmonary disease --Recommend follow-up with pulmonologist in OP setting.  Continue home oxygen. O2 now improved from 6L to 4L.   Anemia --Appears less pale today and with improvement in previous fatigue.  Denies any signs or symptoms of bleeding. H&H stable.   For questions or updates, please contact [place] Please consult [email or web address] for contact info under        Signed, Arvil Chaco, PA-C  06/25/2021, 9:24 AM

## 2021-06-25 NOTE — ED Notes (Signed)
Pt provided a meal tray from dietary.

## 2021-06-26 ENCOUNTER — Encounter: Payer: Self-pay | Admitting: Internal Medicine

## 2021-06-26 DIAGNOSIS — Z86010 Personal history of colonic polyps: Secondary | ICD-10-CM

## 2021-06-26 DIAGNOSIS — E876 Hypokalemia: Secondary | ICD-10-CM | POA: Diagnosis not present

## 2021-06-26 DIAGNOSIS — I480 Paroxysmal atrial fibrillation: Secondary | ICD-10-CM

## 2021-06-26 DIAGNOSIS — I340 Nonrheumatic mitral (valve) insufficiency: Secondary | ICD-10-CM

## 2021-06-26 DIAGNOSIS — I509 Heart failure, unspecified: Secondary | ICD-10-CM | POA: Diagnosis not present

## 2021-06-26 DIAGNOSIS — J9621 Acute and chronic respiratory failure with hypoxia: Secondary | ICD-10-CM | POA: Diagnosis not present

## 2021-06-26 DIAGNOSIS — I483 Typical atrial flutter: Secondary | ICD-10-CM | POA: Diagnosis not present

## 2021-06-26 DIAGNOSIS — J441 Chronic obstructive pulmonary disease with (acute) exacerbation: Secondary | ICD-10-CM | POA: Diagnosis not present

## 2021-06-26 DIAGNOSIS — E782 Mixed hyperlipidemia: Secondary | ICD-10-CM

## 2021-06-26 LAB — CBC
HCT: 35 % — ABNORMAL LOW (ref 36.0–46.0)
Hemoglobin: 11.3 g/dL — ABNORMAL LOW (ref 12.0–15.0)
MCH: 30.5 pg (ref 26.0–34.0)
MCHC: 32.3 g/dL (ref 30.0–36.0)
MCV: 94.3 fL (ref 80.0–100.0)
Platelets: 214 10*3/uL (ref 150–400)
RBC: 3.71 MIL/uL — ABNORMAL LOW (ref 3.87–5.11)
RDW: 17.6 % — ABNORMAL HIGH (ref 11.5–15.5)
WBC: 6 10*3/uL (ref 4.0–10.5)
nRBC: 0 % (ref 0.0–0.2)

## 2021-06-26 LAB — APTT
aPTT: 102 seconds — ABNORMAL HIGH (ref 24–36)
aPTT: 86 seconds — ABNORMAL HIGH (ref 24–36)

## 2021-06-26 LAB — BRAIN NATRIURETIC PEPTIDE: B Natriuretic Peptide: 644.6 pg/mL — ABNORMAL HIGH (ref 0.0–100.0)

## 2021-06-26 LAB — HEPARIN LEVEL (UNFRACTIONATED): Heparin Unfractionated: >1.1 IU/mL — ABNORMAL HIGH (ref 0.30–0.70)

## 2021-06-26 MED ORDER — AZITHROMYCIN 250 MG PO TABS
500.0000 mg | ORAL_TABLET | Freq: Every day | ORAL | Status: AC
  Administered 2021-06-26: 500 mg via ORAL
  Filled 2021-06-26: qty 2

## 2021-06-26 MED ORDER — AZITHROMYCIN 250 MG PO TABS
250.0000 mg | ORAL_TABLET | Freq: Every day | ORAL | Status: DC
  Administered 2021-06-27 – 2021-06-29 (×3): 250 mg via ORAL
  Filled 2021-06-26 (×3): qty 1

## 2021-06-26 MED ORDER — POTASSIUM CHLORIDE 20 MEQ PO PACK
40.0000 meq | PACK | Freq: Every day | ORAL | Status: DC
  Administered 2021-06-27: 40 meq via ORAL
  Filled 2021-06-26 (×2): qty 2

## 2021-06-26 MED ORDER — FUROSEMIDE 40 MG PO TABS: 40.0000 mg | ORAL_TABLET | Freq: Every morning | ORAL | Status: DC

## 2021-06-26 MED ORDER — FUROSEMIDE 10 MG/ML IJ SOLN
20.0000 mg | Freq: Once | INTRAMUSCULAR | Status: AC
  Administered 2021-06-26: 20 mg via INTRAVENOUS
  Filled 2021-06-26: qty 2

## 2021-06-26 NOTE — Consult Note (Signed)
ANTICOAGULATION CONSULT NOTE - Initial Consult  Pharmacy Consult for Heparin infusion Indication: atrial fibrillation  Allergies  Allergen Reactions   Cefdinir Rash    Had hives after completing treatment - unsure if it was the cause.  Has received 1st generation cephalosporin many times (04/24/2012, 07/27/2013, 09/28/2016, 02/20/2018, 07/11/2018, 08/10/2018 without documented ADRs.   Lovenox [Enoxaparin Sodium] Itching   Meperidine Other (See Comments)    Other Reaction: pt does not like how it makes her feel Other reaction(s): Other (See Comments) Other Reaction: pt does not like how it makes her feel    Patient Measurements:   Heparin Dosing Weight: 67.1 kg   Vital Signs: BP: 103/68 (10/28 0413) Pulse Rate: 102 (10/28 0427)  Labs: Recent Labs    06/23/21 1459 06/23/21 1726 06/24/21 0011 06/24/21 0627 06/25/21 0537 06/25/21 1031 06/26/21 0647  HGB 11.0*  --   --  11.0*  --   --  11.3*  HCT 34.5*  --   --  35.6*  --   --  35.0*  PLT 230  --   --  215  --   --  214  APTT  --   --   --   --   --  37* 102*  HEPARINUNFRC  --   --   --   --   --   --  >1.10*  CREATININE 1.16*  --  1.13* 1.11* 1.01*  --   --   TROPONINIHS 26* 29*  --  26*  --   --   --      Estimated Creatinine Clearance: 39.5 mL/min (A) (by C-G formula based on SCr of 1.01 mg/dL (H)).   Medical History: Past Medical History:  Diagnosis Date   Acute midline low back pain without sciatica 08/13/2016   Acute on chronic respiratory failure with hypoxia ([place]) 08/21/2018   Acute respiratory failure with hypoxia (HCC) 12/11/2017   Allergy    Aortic atherosclerosis (HCC)    Atypical atrial flutter ([place])    a.) s/p ablation 07/27/2013 and 05/10/2016   Balance problem    CAD (coronary artery disease)    a. s/p MI x 2 in 2002 s/p PCI x 2 in 2002; b. s/p 2v CABG 2002; c. stress echo 07/2004 w/ evi of pos & inf infarct & no evi of ischemia; d. 4/08 dipyridamole scan w/ multiple areas of infarct, no  ischemia, EF 49%; e. cath 04/28/15 3v CAD, med Rx rec, no targets for revasc, LM lum irregs, pLAD 30%, 100%, ost-pLCx 60%, [name] 99%, OM2 100%, p-mRCA 90%, [name] 100% L-R collats, VG-mLAD irregs, VG-OM2 oc   Chronic anticoagulation    a.) Apixaban   Chronic systolic CHF (congestive heart failure) ([place])    a. echo 03/2015: EF 30-35%, sev ant/inf/pos HK, in mild to mod MR   Complication of anesthesia    a.) postoperative apnea. b.) postoperative hypoxia.   Compressed spine fracture ([place]) 08/06/2016   a.) L2, T11, T12   COPD (chronic obstructive pulmonary disease) (HCC)    Deep vein thrombosis (DVT) of left lower extremity ([place]) 12/2001   Fracture of multiple pubic rami, right, closed, initial encounter ([place]) 05/27/2016   a.) RIGHT superior and inferior pubic rami fractures s/p mechanical fall.   GERD (gastroesophageal reflux disease)    HLD (hyperlipidemia)    HTN (hypertension)    Hypothyroidism    Ischemic cardiomyopathy    Mitral regurgitation    a. s/p mitral ring placement 09/2000; b. echo 09/2010: EF 50%,  inf HK, post AK, mild MR, prosthetic mitral valve ring w/ peak gradient of 10 mmHg; b. echo 2/13: EF 50%, mild MR/TR      Myocardial infarction ([place]) 2002   a.) x 2 in 2002; PCI with stents (unknown type) placed to the p-mRCA, [name], and [name].   Neuropathy    Osteoarthritis    Osteoporosis    Oxygen dependent    a.) 3 L/[place]   Pacemaker    a. MDT 2002; b. generator replacement 2013; c. followed by Dr. Omelia Blackwater, MD   PAF (paroxysmal atrial fibrillation) [facility])    a.) s/p DCCV on 04/08/2017. b.) on apixaban   Personal history of chemotherapy    Personal history of radiation therapy    Pneumonia    S/P CABG x 2 2002   a.) SVG-LAD, SVG-OM   Squamous cell carcinoma of right lung ([place]) 01/03/2015   a.) stage IIIa. b.) s/p concurrent chemotherapy (carboplatin + paclitaxel) and XRT (IMRT 6000 cGy)   SVT (supraventricular tachycardia) (HCC)    Type II diabetes mellitus with complication  ([place]) 09/38/1829    Medications:  Apixaban 2.5 mg BID: Last dose 10/27 0930  Assessment: 85 y.o. female with a history of paroxysmal atrial fibrillation/flutter on chronic apixaban presented to ED with SOB. Pharmacy has been consulted to transition to heparin therapy for possible further cardiac workup.   Date Time aPTT/HL Rate/Comment 1028 0647 102s / >1.10 Therapeutic x1 (no lab correlation); 1000 un/hr   1028 1444 86s / ---    Baseline Labs: aPTT - 37 INR - unk Hgb - 11.3 Plts - 214  Goal of Therapy:  Heparin level 0.3-0.7 units/ml aPTT 66-102 seconds Monitor platelets by anticoagulation protocol: Yes   Plan:  Therapeutic x2 aPTT 86s. HL >1.10 this AM. Still aPTT titration. Continue with current heparin infusion rate of 1000 units/hr. Check anti-Xa/aPTT level with AM labs to assess for correlation. CTM HL daily with AM until correlation. Titrate by aPTT levels alone until that time.   Continue to monitor H&H and platelets  Lorna Dibble, PharmD, [facility] Clinical Pharmacist   06/26/2021,7:40 AM

## 2021-06-26 NOTE — Progress Notes (Signed)
Progress Note  Patient Name: Jenny Cooper Date of Encounter: 06/26/2021  [facility] Cardiologist: Ida Rogue, MD   Subjective   Was sitting up in the chair earlier today, felt very tired, did not sleep much last night Laying in bed on rounds, thick productive cough, could not stop coughing over and over Long discussion with patient and daughter, they report cough at least 3 weeks not getting better Reports that she has chronic bronchitis, but this is worse Lots of sputum, coughing up into her mouth  Inpatient Medications    Scheduled Meds:  budesonide  0.5 mg Nebulization Daily   [START ON 06/27/2021] furosemide  40 mg Oral q morning   levothyroxine  88 mcg Oral Q0600   metoprolol succinate  25 mg Oral Daily   polyethylene glycol  17 g Oral Daily   potassium chloride  40 mEq Oral BID   revefenacin  175 mcg Nebulization Daily   rosuvastatin  5 mg Oral QHS   sodium phosphate  1 enema Rectal Once   vitamin B-12  1,000 mcg Oral Daily   Continuous Infusions:  sodium chloride Stopped (06/23/21 2243)   heparin 1,000 Units/hr (06/25/21 2104)   PRN Meds: sodium chloride, acetaminophen, fluticasone, ipratropium-albuterol, ondansetron **OR** ondansetron (ZOFRAN) IV, oxyCODONE   Vital Signs    Vitals:   06/26/21 0800 06/26/21 0831 06/26/21 0840 06/26/21 1115  BP: 104/70 (!) 102/59  96/62  Pulse:  91  98  Resp: 18 18  17   Temp: 98.4 F (36.9 C) 98.3 F (36.8 C)  98 F (36.7 C)  TempSrc: Oral     SpO2:  99% 98% 96%  Weight:   68.2 kg     Intake/Output Summary (Last 24 hours) at 06/26/2021 1333 Last data filed at 06/26/2021 1100 Gross per 24 hour  Intake 419.33 ml  Output 500 ml  Net -80.67 ml   Last 3 Weights 06/26/2021 06/23/2021 06/02/2021  Weight (lbs) 150 lb 6.4 oz 148 lb 150 lb  Weight (kg) 68.221 kg 67.132 kg 68.04 kg      Telemetry     Personally Reviewed: Atrial flutter rate 90  ECG     - Personally Reviewed  Physical Exam   GEN: No  acute distress.   Neck: No JVD Cardiac: Irregular, no murmurs, rubs, or gallops.  Respiratory: Clear to auscultation bilaterally. GI: Soft, nontender, non-distended  MS: No edema; No deformity. Neuro:  Nonfocal  Psych: Normal affect   Labs    High Sensitivity Troponin:   Recent Labs  Lab 06/23/21 1459 06/23/21 1726 06/24/21 0627  TROPONINIHS 26* 29* 26*     Chemistry Recent Labs  Lab 06/23/21 1459 06/24/21 0011 06/24/21 0627 06/25/21 0537  NA 137 137 136 137  K 2.8* 4.3 3.7 4.4  CL 100 102 100 102  CO2 26 30 29 31   GLUCOSE 124* 103* 94 102*  BUN 17 17 15 12   CREATININE 1.16* 1.13* 1.11* 1.01*  CALCIUM 8.8* 8.6* 8.7* 8.7*  MG 1.7  --  1.9  --   PROT 7.7  --   --   --   ALBUMIN 3.4*  --   --   --   AST 22  --   --   --   ALT 8  --   --   --   ALKPHOS 82  --   --   --   BILITOT 1.5*  --   --   --   GFRNONAA 47* 48* 49* 55*  ANIONGAP 11 5 7  4*    Lipids No results for input(s): CHOL, TRIG, HDL, LABVLDL, LDLCALC, CHOLHDL in the last 168 hours.  Hematology Recent Labs  Lab 06/23/21 1459 06/24/21 0627 06/26/21 0647  WBC 6.4 5.2 6.0  RBC 3.55* 3.70* 3.71*  HGB 11.0* 11.0* 11.3*  HCT 34.5* 35.6* 35.0*  MCV 97.2 96.2 94.3  MCH 31.0 29.7 30.5  MCHC 31.9 30.9 32.3  RDW 17.7* 17.9* 17.6*  PLT 230 215 214   Thyroid No results for input(s): TSH, FREET4 in the last 168 hours.  BNP Recent Labs  Lab 06/23/21 1103 06/23/21 1459 06/26/21 0919  BNP 2,164.2* 2,537.6* 644.6*    DDimer No results for input(s): DDIMER in the last 168 hours.   Radiology    No results found.  Cardiac Studies     Patient Profile     85 y.o. female  with history of COPD on home oxygen, former tobacco use, stage III squamous cell lung carcinoma s/p chemoradiation therapy 5102, diastolic heart failure, hypertension, paroxysmal atrial fibrillation/flutter on chronic anticoagulation and amiodarone therapy, CAD s/p CABG, PPM, and who presents today after sent from the office due to  volume overload with need for IV diuresis that was precluded by hypotension and significant hypokalemia with need for repletion.  Assessment & Plan    Acute on chronic diastolic and systolic CHF As outpatient on Lasix 20 twice daily -At discharge would consider Lasix 40 in the morning 20 in the afternoon -Needs daily weights at home  Acute on chronic bronchitis On rounds laying supine in bed with tremendous coughing sputum distress Family reports been ongoing for 3 weeks Symptoms not going away -We will discuss with hospitalist service.  Might consider treatment with antibiotic -Possibly contributing to low blood pressure, most of her medications on hold with hypotension   Ischemic cardiomyopathy echo EF 40 to 45% In atrial flutter Entresto on hold given low blood pressure as above Given metoprolol succinate 25, low blood pressure we will go back to 12.5 daily   Cad/CABG with chronic stable angina Minimal troponin elevation likely from demand ischemia Continue Eliquis, Crestor Denies anginal symptoms   History of A. fib/flutter Persistent, possibly permanent fib flutter On Eliquis   Presyncope Improved orthostasis symptoms by holding Entresto, decreasing metoprolol dosing Etiology of low blood pressure unclear, possibly from infection as detailed above Has also been on some pain medication for arm  Long discussion with patient and family at the bedside Above discussed with hospitalist service  Total encounter time more than 35 minutes  Greater than 50% was spent in counseling and coordination of care with the patient   For questions or updates, please contact [facility] Please consult [email or web address] for contact info under        Signed, Ida Rogue, MD  06/26/2021, 1:33 PM

## 2021-06-26 NOTE — TOC Initial Note (Addendum)
Transition of Care (TOC) - Initial/Assessment Note    Patient Details  Name: Jenny Cooper MRN: [id number] Date of Birth: 11/09/1935  Transition of Care (TOC) CM/SW Contact:    Meagan E Hagwood, LCSW Phone Number: 06/26/2021, 10:46 AM  Clinical Narrative:              CSW met with patient and daughter at bedside. Patient lives with her son. Other family is close by. Family provides transportation. PCP is Dr. Berglund. Pharmacy is [facility] in [place]. Patient has home oxygen ([employer]), RW, and shower chair at home. Patient is active with [facility] for HHPT and wishes to continue this at DC. TOC to follow for needs. Patient and daughter declined needs at this time.     CSW notified [place] with [facility] of possible DC today per RN. Notified MD, needs HHRN PT OT orders.  Expected Discharge Plan: Home w Home Health Services Barriers to Discharge: Continued Medical Work up   Patient Goals and CMS Choice Patient states their goals for this hospitalization and ongoing recovery are:: to return home with home health CMS Medicare.gov Compare Post Acute Care list provided to:: Patient Choice offered to / list presented to : Patient, Adult Children  Expected Discharge Plan and Services Expected Discharge Plan: Home w Home Health Services       Living arrangements for the past 2 months: Single Family Home                                      Prior Living Arrangements/Services Living arrangements for the past 2 months: Single Family Home Lives with:: Adult Children Patient language and need for interpreter reviewed:: Yes Do you feel safe going back to the place where you live?: Yes      Need for Family Participation in Patient Care: Yes (Comment) Care giver support system in place?: Yes (comment) Current home services: DME, Home PT Criminal Activity/Legal Involvement Pertinent to Current Situation/Hospitalization: No - Comment as needed  Activities of Daily  Living      Permission Sought/Granted Permission sought to share information with : Facility Contact Representative Permission granted to share information with : Yes, Verbal Permission Granted     Permission granted to share info w AGENCY: [facility]  Permission granted to share info w Relationship: children     Emotional Assessment       Orientation: : Oriented to Self, Oriented to Place, Oriented to  Time, Oriented to Situation Alcohol / Substance Use: Not Applicable Psych Involvement: No (comment)  Admission diagnosis:  CHF (congestive heart failure) (HCC) [I50.9] Acute exacerbation of CHF (congestive heart failure) (HCC) [I50.9] Acute on chronic respiratory failure with hypoxia (HCC) [J96.21] Acute on chronic congestive heart failure, unspecified heart failure type (HCC) [I50.9] Patient Active Problem List   Diagnosis Date Noted   CHF (congestive heart failure) (HCC) 06/25/2021   Acute exacerbation of CHF (congestive heart failure) (HCC) 06/23/2021   Hypokalemia 06/23/2021   Troponin level elevated 06/23/2021   Status post reverse arthroplasty of shoulder, right 06/02/2021   Squamous cell lung cancer (HCC) 02/11/2020   Drug-induced polyneuropathy (HCC) 01/23/2020   Low vitamin B12 level 01/14/2019   Pulmonary hypertension, unspecified (HCC) 12/01/2018   Chronic respiratory failure with hypoxia (HCC) 12/01/2018   Normocytic anemia 10/17/2018   Recurrent pleural effusion on right 07/11/2018   Environmental and seasonal allergies 12/20/2017   Muscle spasms   of neck 12/20/2017   Coronary artery disease of bypass graft of native heart with stable angina pectoris (HCC) 04/21/2017   Chronic combined systolic and diastolic CHF (congestive heart failure) (HCC) 04/14/2017   Sinus node dysfunction (HCC) 04/06/2017   Back pain of thoracolumbar region 03/15/2017   PAF (paroxysmal atrial fibrillation) (HCC) 03/05/2017   Falls frequently 03/05/2017   Compression fracture of L2  lumbar vertebra (HCC) 08/31/2016   Cardiomyopathy (HCC) 04/29/2016   Hx of adenomatous colonic polyps 11/17/2015   Radiation pneumonitis (HCC) 10/21/2015   Mitral regurgitation    HLD (hyperlipidemia)    Paroxysmal supraventricular tachycardia (HCC)    NSTEMI (non-ST elevated myocardial infarction) (HCC)    Arteriosclerosis of coronary artery 01/11/2015   Hypothyroidism (acquired) 01/11/2015   Disorder of peripheral nervous system 10/04/2014   Cervical radiculopathy, chronic 10/04/2014   Atrial flutter (HCC) 11/16/2013   Chronic obstructive pulmonary disease (HCC) 04/24/2012   Essential hypertension 08/03/2011   Pacemaker 08/03/2011   PCP:  Berglund, Laura H, MD Pharmacy:   [facility] - [place], [place] - [address] [place] [address] Phone: [phone] Fax: [phone]     Social Determinants of Health (SDOH) Interventions    Readmission Risk Interventions Readmission Risk Prevention Plan 06/26/2021  Transportation Screening Complete  PCP or Specialist Appt within 3-5 Days Complete  HRI or Home Care Consult Complete  Social Work Consult for Recovery Care Planning/Counseling Complete  Palliative Care Screening Not Applicable  Medication Review (RN Care Manager) Complete  Some recent data might be hidden     

## 2021-06-26 NOTE — Progress Notes (Signed)
PROGRESS NOTE    Jenny Cooper  SNK:[id number] DOB: 01/06/1936 DOA: 06/23/2021 PCP: Glean Hess, MD    Brief Narrative:  Jenny Cooper is a 85 y.o. female with medical history significant for paroxysmal atrial fibrillation, history of squamous cell lung carcinoma stage III, status post chemoradiation therapy in 2016, on chronic anticoagulation and amiodarone therapy, CAD status post CABG in 2002, former tobacco use, COPD, history of left lower extremity DVT, hyperlipidemia, hypertension, hypothyroid, ischemic cardiomyopathy, mitral regurgitation, neuropathy,.She was told to come to the ED for further evaluation due to the low potassium.    10/28 reported coughing with phlegm. From sob stand point doing better. Bp low   Consultants:  Cardiology  Procedures:   Antimicrobials:      Subjective: Shortness of breath not at baseline.  No chest pain.  No dizziness.  Objective: Vitals:   06/26/21 0427 06/26/21 0800 06/26/21 0831 06/26/21 0840  BP:  104/70 (!) 102/59   Pulse: (!) 102  91   Resp: 17 18 18    Temp:  98.4 F (36.9 C) 98.3 F (36.8 C)   TempSrc:  Oral    SpO2: 91%  99% 98%  Weight:    68.2 kg    Intake/Output Summary (Last 24 hours) at 06/26/2021 0906 Last data filed at 06/26/2021 0300 Gross per 24 hour  Intake 59.33 ml  Output 500 ml  Net -440.67 ml   Filed Weights   06/26/21 0840  Weight: 68.2 kg    Examination: Nad, calm Minimal scattered crackles at bases Reg s1/s2 no gallop Soft benign +bs No edema aaxoxo3   Data Reviewed: I have personally reviewed following labs and imaging studies  CBC: Recent Labs  Lab 06/23/21 1103 06/23/21 1459 06/24/21 0627 06/26/21 0647  WBC 6.9 6.4 5.2 6.0  NEUTROABS  --  4.5  --   --   HGB 11.5* 11.0* 11.0* 11.3*  HCT 36.2 34.5* 35.6* 35.0*  MCV 98.1 97.2 96.2 94.3  PLT 223 230 215 937   Basic Metabolic Panel: Recent Labs  Lab 06/23/21 1103 06/23/21 1459 06/24/21 0011 06/24/21 0627  06/25/21 0537  NA 138 137 137 136 137  K 2.9* 2.8* 4.3 3.7 4.4  CL 99 100 102 100 102  CO2 28 26 30 29 31   GLUCOSE 109* 124* 103* 94 102*  BUN 16 17 17 15 12   CREATININE 1.12* 1.16* 1.13* 1.11* 1.01*  CALCIUM 8.8* 8.8* 8.6* 8.7* 8.7*  MG  --  1.7  --  1.9  --   PHOS  --   --   --  2.6  --    GFR: Estimated Creatinine Clearance: 39.5 mL/min (A) (by C-G formula based on SCr of 1.01 mg/dL (H)). Liver Function Tests: Recent Labs  Lab 06/23/21 1459  AST 22  ALT 8  ALKPHOS 82  BILITOT 1.5*  PROT 7.7  ALBUMIN 3.4*   No results for input(s): LIPASE, AMYLASE in the last 168 hours. No results for input(s): AMMONIA in the last 168 hours. Coagulation Profile: No results for input(s): INR, PROTIME in the last 168 hours. Cardiac Enzymes: No results for input(s): CKTOTAL, CKMB, CKMBINDEX, TROPONINI in the last 168 hours. BNP (last 3 results) No results for input(s): PROBNP in the last 8760 hours. HbA1C: No results for input(s): HGBA1C in the last 72 hours. CBG: No results for input(s): GLUCAP in the last 168 hours. Lipid Profile: No results for input(s): CHOL, HDL, LDLCALC, TRIG, CHOLHDL, LDLDIRECT in the last 72 hours. Thyroid Function  Tests: No results for input(s): TSH, T4TOTAL, FREET4, T3FREE, THYROIDAB in the last 72 hours. Anemia Panel: No results for input(s): VITAMINB12, FOLATE, FERRITIN, TIBC, IRON, RETICCTPCT in the last 72 hours. Sepsis Labs: Recent Labs  Lab 06/23/21 1726  PROCALCITON <0.10    Recent Results (from the past 240 hour(s))  Resp Panel by RT-PCR (Flu A&B, Covid) Nasopharyngeal Swab     Status: None   Collection Time: 06/23/21  5:26 PM   Specimen: Nasopharyngeal Swab; Nasopharyngeal(NP) swabs in vial transport medium  Result Value Ref Range Status   SARS Coronavirus 2 by RT PCR NEGATIVE NEGATIVE Final    Comment: (NOTE) SARS-CoV-2 target nucleic acids are NOT DETECTED.  The SARS-CoV-2 RNA is generally detectable in upper respiratory specimens  during the acute phase of infection. The lowest concentration of SARS-CoV-2 viral copies this assay can detect is 138 copies/mL. A negative result does not preclude SARS-Cov-2 infection and should not be used as the sole basis for treatment or other patient management decisions. A negative result may occur with  improper specimen collection/handling, submission of specimen other than nasopharyngeal swab, presence of viral mutation(s) within the areas targeted by this assay, and inadequate number of viral copies(<138 copies/mL). A negative result must be combined with clinical observations, patient history, and epidemiological information. The expected result is Negative.  Fact Sheet for Patients:  EntrepreneurPulse.com.au  Fact Sheet for Healthcare Providers:  IncredibleEmployment.be  This test is no t yet approved or cleared by the [place] FDA and  has been authorized for detection and/or diagnosis of SARS-CoV-2 by FDA under an Emergency Use Authorization (EUA). This EUA will remain  in effect (meaning this test can be used) for the duration of the COVID-19 declaration under Section 564(b)(1) of the Act, 21 U.S.C.section 360bbb-3(b)(1), unless the authorization is terminated  or revoked sooner.       Influenza A by PCR NEGATIVE NEGATIVE Final   Influenza B by PCR NEGATIVE NEGATIVE Final    Comment: (NOTE) The Xpert Xpress SARS-CoV-2/FLU/RSV plus assay is intended as an aid in the diagnosis of influenza from Nasopharyngeal swab specimens and should not be used as a sole basis for treatment. Nasal washings and aspirates are unacceptable for Xpert Xpress SARS-CoV-2/FLU/RSV testing.  Fact Sheet for Patients: EntrepreneurPulse.com.au  Fact Sheet for Healthcare Providers: IncredibleEmployment.be  This test is not yet approved or cleared by the [place] FDA and has been authorized for detection  and/or diagnosis of SARS-CoV-2 by FDA under an Emergency Use Authorization (EUA). This EUA will remain in effect (meaning this test can be used) for the duration of the COVID-19 declaration under Section 564(b)(1) of the Act, 21 U.S.C. section 360bbb-3(b)(1), unless the authorization is terminated or revoked.  Performed at [facility], [address] [place], [place] [address]          Radiology Studies: ECHOCARDIOGRAM COMPLETE  Result Date: 06/24/2021    ECHOCARDIOGRAM REPORT   Patient Name:   Jenny Cooper Date of Exam: 06/24/2021 Medical Rec #:  [id number]        Height:       66.0 in Accession #:    [id number]       Weight:       148.0 lb Date of Birth:  [birth year]        BSA:          1.760 m Patient Age:    60 years         BP:  108/63 mmHg Patient Gender: F                HR:           96 bpm. Exam Location:  [facility] Procedure: 2D Echo, Color Doppler, Cardiac Doppler and Intracardiac            Opacification Agent Indications:     R06.00 Dyspnea  History:         Patient has prior history of Echocardiogram examinations. CHF,                  CAD, Prior CABG and Pacemaker, COPD; Risk Factors:Diabetes,                  Hypertension and Dyslipidemia.  Sonographer:     Charmayne Sheer Referring Phys:  [id number] AMY N COX Diagnosing Phys: Kate Sable MD  Sonographer Comments: Technically difficult study due to poor echo windows. IMPRESSIONS  1. Left ventricular ejection fraction, by estimation, is 40 to 45%. The left ventricle has mild to moderately decreased function. The left ventricle demonstrates global hypokinesis. Left ventricular diastolic parameters are indeterminate.  2. Right ventricular systolic function is low normal. The right ventricular size is normal. There is moderately elevated pulmonary artery systolic pressure.  3. The mitral valve is degenerative. Trivial mitral valve regurgitation. Moderate mitral annular calcification.  4. The aortic valve was not well  visualized. Aortic valve regurgitation is not visualized.  5. The inferior vena cava is dilated in size with <50% respiratory variability, suggesting right atrial pressure of 15 mmHg. FINDINGS  Left Ventricle: Left ventricular ejection fraction, by estimation, is 40 to 45%. The left ventricle has mild to moderately decreased function. The left ventricle demonstrates global hypokinesis. Definity contrast agent was given IV to delineate the left  ventricular endocardial borders. The left ventricular internal cavity size was normal in size. There is no left ventricular hypertrophy. Left ventricular diastolic parameters are indeterminate. Right Ventricle: The right ventricular size is normal. No increase in right ventricular wall thickness. Right ventricular systolic function is low normal. There is moderately elevated pulmonary artery systolic pressure. The tricuspid regurgitant velocity  is 3.20 m/s, and with an assumed right atrial pressure of 15 mmHg, the estimated right ventricular systolic pressure is 95.2 mmHg. Left Atrium: Left atrial size was normal in size. Right Atrium: Right atrial size was normal in size. Pericardium: There is no evidence of pericardial effusion. Mitral Valve: The mitral valve is degenerative in appearance. Moderate mitral annular calcification. Trivial mitral valve regurgitation. MV peak gradient, 8.1 mmHg. The mean mitral valve gradient is 3.0 mmHg. Tricuspid Valve: The tricuspid valve is normal in structure. Tricuspid valve regurgitation is mild. Aortic Valve: The aortic valve was not well visualized. Aortic valve regurgitation is not visualized. Aortic valve mean gradient measures 3.0 mmHg. Aortic valve peak gradient measures 5.8 mmHg. Aortic valve area, by VTI measures 1.11 cm. Pulmonic Valve: The pulmonic valve was not well visualized. Pulmonic valve regurgitation is not visualized. Aorta: The aortic root is normal in size and structure. Venous: The inferior vena cava is dilated in  size with less than 50% respiratory variability, suggesting right atrial pressure of 15 mmHg. IAS/Shunts: No atrial level shunt detected by color flow Doppler. Additional Comments: A device lead is visualized.  LEFT VENTRICLE PLAX 2D LVIDd:         3.61 cm   Diastology LVIDs:         2.86 cm   LV e' lateral:  5.98 cm/s LV PW:         0.90 cm   LV E/e' lateral: 17.4 LV IVS:        0.71 cm LVOT diam:     1.60 cm LV SV:         22 LV SV Index:   12 LVOT Area:     2.01 cm  LEFT ATRIUM         Index LA diam:    4.30 cm 2.44 cm/m  AORTIC VALVE                    PULMONIC VALVE AV Area (Vmax):    0.97 cm     PV Vmax:       0.78 m/s AV Area (Vmean):   0.96 cm     PV Vmean:      48.600 cm/s AV Area (VTI):     1.11 cm     PV VTI:        0.106 m AV Vmax:           120.00 cm/s  PV Peak grad:  2.4 mmHg AV Vmean:          79.200 cm/s  PV Mean grad:  1.0 mmHg AV VTI:            0.193 m AV Peak Grad:      5.8 mmHg AV Mean Grad:      3.0 mmHg LVOT Vmax:         58.10 cm/s LVOT Vmean:        38.000 cm/s LVOT VTI:          0.107 m LVOT/AV VTI ratio: 0.55  AORTA Ao Root diam: 2.70 cm MITRAL VALVE                TRICUSPID VALVE MV Area (PHT): 4.39 cm     TR Peak grad:   41.0 mmHg MV Area VTI:   1.01 cm     TR Vmax:        320.00 cm/s MV Peak grad:  8.1 mmHg MV Mean grad:  3.0 mmHg     SHUNTS MV Vmax:       1.42 m/s     Systemic VTI:  0.11 m MV Vmean:      78.3 cm/s    Systemic Diam: 1.60 cm MV Decel Time: 173 msec MV E velocity: 104.00 cm/s Kate Sable MD Electronically signed by Kate Sable MD Signature Date/Time: 06/24/2021/5:26:25 PM    Final         Scheduled Meds:  budesonide  0.5 mg Nebulization Daily   furosemide  20 mg Intravenous BID   levothyroxine  88 mcg Oral Q0600   metoprolol succinate  25 mg Oral Daily   polyethylene glycol  17 g Oral Daily   potassium chloride  40 mEq Oral BID   revefenacin  175 mcg Nebulization Daily   rosuvastatin  5 mg Oral QHS   sodium phosphate  1 enema Rectal Once    vitamin B-12  1,000 mcg Oral Daily   Continuous Infusions:  sodium chloride Stopped (06/23/21 2243)   heparin 1,000 Units/hr (06/25/21 2104)    Assessment & Plan:   Principal Problem:   Hypokalemia Active Problems:   Essential hypertension   Hypothyroidism (acquired)   Mitral regurgitation   HLD (hyperlipidemia)   Chronic obstructive pulmonary disease (HCC)   Hx of adenomatous colonic polyps   Atrial flutter (HCC)   PAF (paroxysmal  atrial fibrillation) (HCC)   Sinus node dysfunction (HCC)   Recurrent pleural effusion on right   Pulmonary hypertension, unspecified (HCC)   Chronic respiratory failure with hypoxia (HCC)   Squamous cell lung cancer (HCC)   Status post reverse arthroplasty of shoulder, right   Acute exacerbation of CHF (congestive heart failure) (HCC)   Troponin level elevated   CHF (congestive heart failure) ([place])   # Severe hypokalemia -presumed secondary to acute kidney injury in setting of cardiorenal 10/28 potassium stable after replacement  Acute on chronic combined systolic and diastolic heart failure  EF 40 to 45% Receiving IV Lasix, transitioning to p.o. in am BNP down, clinically improving BMP in a.m. Daily weight At discharge consider Lasix 40 in a.m., 20 in the afternoon Follow-up with heart failure clinic Holding Entresto due to low blood pressure On Toprol-XL decreased to 12.5 daily due to low blood pressure   Acute on chronic bronchitis  Having more cough/sputum.  No wbc or fever Will start on zpack  #h/xo afib/flutter On eliquis and beta blk  # Acute kidney injury-presumed secondary to cardiorenal Continue to hold Entresto.  Creatinine improving   # Elevated troponin History of CAD/CABG- Likely due to demand ischemia due to to acute CHF.   10/27 Eliquis transition to IV heparin in case needs ischemic work-up  #Constipation Likely from pain meds bowel regimen and enemas   # Squamous cell carcinoma of the right lung-stage  IIIa - Status post concurrent chemotherapy and radiation in 2016 with carboplatin and paclitaxel     # Hyperlipidemia-continue statins  # Hypertension-continue beta-blockers  # Hypothyroid-continue levothyroxine     DVT prophylaxis: heparin gtt >>>>Eliquis Code Status: Full Family Communication: daughter at bedside Disposition Plan: Back home Status is: Inpatient  Patient remains inpatient as she requiring IV treatment for stability of her hospitalization.           LOS: 1 day   Time spent: 35 minutes with more than 50% on [place], MD [facility] Pager [phone]  If 7PM-7AM, please contact night-coverage 06/26/2021, 9:06 AM

## 2021-06-26 NOTE — Consult Note (Signed)
ANTICOAGULATION CONSULT NOTE - Initial Consult  Pharmacy Consult for Heparin infusion Indication: atrial fibrillation  Allergies  Allergen Reactions   Cefdinir Rash    Had hives after completing treatment - unsure if it was the cause.  Has received 1st generation cephalosporin many times (04/24/2012, 07/27/2013, 09/28/2016, 02/20/2018, 07/11/2018, 08/10/2018 without documented ADRs.   Lovenox [Enoxaparin Sodium] Itching   Meperidine Other (See Comments)    Other Reaction: pt does not like how it makes her feel Other reaction(s): Other (See Comments) Other Reaction: pt does not like how it makes her feel    Patient Measurements:   Heparin Dosing Weight: 67.1 kg   Vital Signs: BP: 103/68 (10/28 0413) Pulse Rate: 102 (10/28 0427)  Labs: Recent Labs    06/23/21 1459 06/23/21 1726 06/24/21 0011 06/24/21 0627 06/25/21 0537 06/25/21 1031 06/26/21 0647  HGB 11.0*  --   --  11.0*  --   --  11.3*  HCT 34.5*  --   --  35.6*  --   --  35.0*  PLT 230  --   --  215  --   --  214  APTT  --   --   --   --   --  37* 102*  HEPARINUNFRC  --   --   --   --   --   --  >1.10*  CREATININE 1.16*  --  1.13* 1.11* 1.01*  --   --   TROPONINIHS 26* 29*  --  26*  --   --   --      Estimated Creatinine Clearance: 39.5 mL/min (A) (by C-G formula based on SCr of 1.01 mg/dL (H)).   Medical History: Past Medical History:  Diagnosis Date   Acute midline low back pain without sciatica 08/13/2016   Acute on chronic respiratory failure with hypoxia ([place]) 08/21/2018   Acute respiratory failure with hypoxia (HCC) 12/11/2017   Allergy    Aortic atherosclerosis (HCC)    Atypical atrial flutter ([place])    a.) s/p ablation 07/27/2013 and 05/10/2016   Balance problem    CAD (coronary artery disease)    a. s/p MI x 2 in 2002 s/p PCI x 2 in 2002; b. s/p 2v CABG 2002; c. stress echo 07/2004 w/ evi of pos & inf infarct & no evi of ischemia; d. 4/08 dipyridamole scan w/ multiple areas of infarct, no  ischemia, EF 49%; e. cath 04/28/15 3v CAD, med Rx rec, no targets for revasc, LM lum irregs, pLAD 30%, 100%, ost-pLCx 60%, [name] 99%, OM2 100%, p-mRCA 90%, [name] 100% L-R collats, VG-mLAD irregs, VG-OM2 oc   Chronic anticoagulation    a.) Apixaban   Chronic systolic CHF (congestive heart failure) ([place])    a. echo 03/2015: EF 30-35%, sev ant/inf/pos HK, in mild to mod MR   Complication of anesthesia    a.) postoperative apnea. b.) postoperative hypoxia.   Compressed spine fracture ([place]) 08/06/2016   a.) L2, T11, T12   COPD (chronic obstructive pulmonary disease) (HCC)    Deep vein thrombosis (DVT) of left lower extremity ([place]) 12/2001   Fracture of multiple pubic rami, right, closed, initial encounter ([place]) 05/27/2016   a.) RIGHT superior and inferior pubic rami fractures s/p mechanical fall.   GERD (gastroesophageal reflux disease)    HLD (hyperlipidemia)    HTN (hypertension)    Hypothyroidism    Ischemic cardiomyopathy    Mitral regurgitation    a. s/p mitral ring placement 09/2000; b. echo 09/2010: EF 50%,  inf HK, post AK, mild MR, prosthetic mitral valve ring w/ peak gradient of 10 mmHg; b. echo 2/13: EF 50%, mild MR/TR      Myocardial infarction ([place]) 2002   a.) x 2 in 2002; PCI with stents (unknown type) placed to the p-mRCA, [name], and [name].   Neuropathy    Osteoarthritis    Osteoporosis    Oxygen dependent    a.) 3 L/[place]   Pacemaker    a. MDT 2002; b. generator replacement 2013; c. followed by Dr. Omelia Blackwater, MD   PAF (paroxysmal atrial fibrillation) [facility])    a.) s/p DCCV on 04/08/2017. b.) on apixaban   Personal history of chemotherapy    Personal history of radiation therapy    Pneumonia    S/P CABG x 2 2002   a.) SVG-LAD, SVG-OM   Squamous cell carcinoma of right lung ([place]) 01/03/2015   a.) stage IIIa. b.) s/p concurrent chemotherapy (carboplatin + paclitaxel) and XRT (IMRT 6000 cGy)   SVT (supraventricular tachycardia) (HCC)    Type II diabetes mellitus with complication  ([place]) 99/35/7017    Medications:  Apixaban 2.5 mg BID: Last dose 10/27 0930  Assessment: 85 y.o. female with a history of paroxysmal atrial fibrillation/flutter on chronic apixaban presented to ED with SOB. Pharmacy has been consulted to transition to heparin therapy for possible further cardiac workup.   Date Time aPTT/HL Rate/comment 10/28 0647 102/>1.10 1000 un/hr, aPTT therapeutic  Goal of Therapy:  Heparin level 0.3-0.7 units/ml aPTT 66-102 seconds Monitor platelets by anticoagulation protocol: Yes   Plan:  aPTT 102, therapeutic Continue heparin infusion at 1000 units/hr  Check confirmatory aPTT level in 8 hours  Continue monitoring via aPTT levels since recent DOAC Transition to heparin level monitoring once aPTT and HL correlate  Monitor daily CBC, s/s of bleed   Darnelle Bos, PharmD Clinical Pharmacist   06/26/2021,7:53 AM

## 2021-06-27 DIAGNOSIS — E876 Hypokalemia: Secondary | ICD-10-CM | POA: Diagnosis not present

## 2021-06-27 DIAGNOSIS — I10 Essential (primary) hypertension: Secondary | ICD-10-CM | POA: Diagnosis not present

## 2021-06-27 DIAGNOSIS — J441 Chronic obstructive pulmonary disease with (acute) exacerbation: Secondary | ICD-10-CM | POA: Diagnosis not present

## 2021-06-27 DIAGNOSIS — I483 Typical atrial flutter: Secondary | ICD-10-CM | POA: Diagnosis not present

## 2021-06-27 DIAGNOSIS — I5033 Acute on chronic diastolic (congestive) heart failure: Secondary | ICD-10-CM

## 2021-06-27 LAB — BASIC METABOLIC PANEL
Anion gap: 7 (ref 5–15)
BUN: 14 mg/dL (ref 8–23)
CO2: 29 mmol/L (ref 22–32)
Calcium: 9 mg/dL (ref 8.9–10.3)
Chloride: 98 mmol/L (ref 98–111)
Creatinine, Ser: 1.04 mg/dL — ABNORMAL HIGH (ref 0.44–1.00)
GFR, Estimated: 53 mL/min — ABNORMAL LOW (ref >60)
Glucose, Bld: 99 mg/dL (ref 70–99)
Potassium: 4.1 mmol/L (ref 3.5–5.1)
Sodium: 134 mmol/L — ABNORMAL LOW (ref 135–145)

## 2021-06-27 LAB — CBC
HCT: 35.7 % — ABNORMAL LOW (ref 36.0–46.0)
Hemoglobin: 11.3 g/dL — ABNORMAL LOW (ref 12.0–15.0)
MCH: 30 pg (ref 26.0–34.0)
MCHC: 31.7 g/dL (ref 30.0–36.0)
MCV: 94.7 fL (ref 80.0–100.0)
Platelets: 216 10*3/uL (ref 150–400)
RBC: 3.77 MIL/uL — ABNORMAL LOW (ref 3.87–5.11)
RDW: 17.5 % — ABNORMAL HIGH (ref 11.5–15.5)
WBC: 5 10*3/uL (ref 4.0–10.5)
nRBC: 0 % (ref 0.0–0.2)

## 2021-06-27 LAB — APTT: aPTT: 132 seconds — ABNORMAL HIGH (ref 24–36)

## 2021-06-27 LAB — HEPARIN LEVEL (UNFRACTIONATED): Heparin Unfractionated: >1.1 IU/mL — ABNORMAL HIGH (ref 0.30–0.70)

## 2021-06-27 MED ORDER — APIXABAN 5 MG PO TABS
5.0000 mg | ORAL_TABLET | Freq: Two times a day (BID) | ORAL | Status: DC
  Administered 2021-06-27 – 2021-06-29 (×5): 5 mg via ORAL
  Filled 2021-06-27 (×5): qty 1

## 2021-06-27 MED ORDER — FUROSEMIDE 20 MG PO TABS: 20.0000 mg | ORAL_TABLET | Freq: Two times a day (BID) | ORAL | Status: DC

## 2021-06-27 MED ORDER — METOPROLOL SUCCINATE ER 25 MG PO TB24
12.5000 mg | ORAL_TABLET | Freq: Every day | ORAL | Status: DC
  Administered 2021-06-28 – 2021-06-29 (×2): 12.5 mg via ORAL
  Filled 2021-06-27 (×2): qty 1

## 2021-06-27 MED ORDER — MIDODRINE HCL 5 MG PO TABS
2.5000 mg | ORAL_TABLET | Freq: Three times a day (TID) | ORAL | Status: DC
  Administered 2021-06-27 – 2021-06-28 (×4): 2.5 mg via ORAL
  Filled 2021-06-27 (×4): qty 1

## 2021-06-27 MED ORDER — HEPARIN (PORCINE) 25000 UT/250ML-% IV SOLN: 750.0000 [IU]/h | INTRAVENOUS | Status: DC

## 2021-06-27 NOTE — Progress Notes (Signed)
   06/26/21 2345  Assess: MEWS Score  Temp 98.2 F (36.8 C)  BP (!) 87/54  Pulse Rate (!) 103  Resp 16  Level of Consciousness Alert  SpO2 95 %  O2 Device Nasal Cannula  Patient Activity (if Appropriate) In bed  O2 Flow Rate (L/min) 3 L/min  Assess: MEWS Score  MEWS Temp 0  MEWS Systolic 1  MEWS Pulse 1  MEWS RR 0  MEWS LOC 0  MEWS Score 2  MEWS Score Color Yellow  Assess: if the MEWS score is Yellow or Red  Were vital signs taken at a resting state? Yes  Focused Assessment No change from prior assessment  Does the patient meet 2 or more of the SIRS criteria? No  MEWS guidelines implemented *See Row Information* Yes  Treat  Pain Scale 0-10  Pain Score 3  Pain Type Chronic pain  Pain Location Back  Pain Orientation Lower  Pain Descriptors / Indicators Aching  Pain Onset On-going  Pain Intervention(s) Repositioned  Take Vital Signs  Increase Vital Sign Frequency  Yellow: Q 2hr X 2 then Q 4hr X 2, if remains yellow, continue Q 4hrs  Escalate  MEWS: Escalate Yellow: discuss with charge nurse/RN and consider discussing with provider and RRT  Notify: Charge Nurse/RN  Name of Charge Nurse/RN Notified Ria Comment RN  Date Charge Nurse/RN Notified 06/27/21  Time Charge Nurse/RN Notified 0040  Notify: Provider  Provider Name/Title B. Randol Kern, NP  Date Provider Notified 06/27/21  Time Provider Notified 0005  Notification Type  (secure chat)  Notification Reason Other (Comment) (yellow mews, BP 85/87, recheck 93/55. HR 100s chronic afib. no void after lasix)  Provider response See new orders (bladder scan)  Date of Provider Response 06/27/21  Document  Patient Outcome Other (Comment) (BP improved)  Assess: SIRS CRITERIA  SIRS Temperature  0  SIRS Pulse 1  SIRS Respirations  0  SIRS WBC 0  SIRS Score Sum  1   Bladder scan done, pt unable to void in bed. Waited a while per pt request then BP improved and assisted to [facility] and able to void.

## 2021-06-27 NOTE — Progress Notes (Signed)
Progress Note  Patient Name: Jenny Cooper Date of Encounter: 06/27/2021  [facility] Cardiologist: Ida Rogue, MD   Subjective   Feeling much better.  She has been walking around the room but not in the hallways.  Her breathing is improving and she has no orthopnea.   Inpatient Medications    Scheduled Meds:  apixaban  5 mg Oral BID   azithromycin  250 mg Oral Daily   budesonide  0.5 mg Nebulization Daily   furosemide  20 mg Oral BID   levothyroxine  88 mcg Oral Q0600   metoprolol succinate  25 mg Oral Daily   polyethylene glycol  17 g Oral Daily   potassium chloride  40 mEq Oral Daily   revefenacin  175 mcg Nebulization Daily   rosuvastatin  5 mg Oral QHS   sodium phosphate  1 enema Rectal Once   vitamin B-12  1,000 mcg Oral Daily   Continuous Infusions:  sodium chloride Stopped (06/23/21 2243)   PRN Meds: sodium chloride, acetaminophen, fluticasone, ipratropium-albuterol, ondansetron **OR** ondansetron (ZOFRAN) IV, oxyCODONE   Vital Signs    Vitals:   06/27/21 0958 06/27/21 1003 06/27/21 1019 06/27/21 1141  BP: (!) 88/66 (!) 88/56 (!) 88/55 96/63  Pulse: 86 91  100  Resp:  20  18  Temp:    97.7 F (36.5 C)  TempSrc:      SpO2: 97% 97%  98%  Weight:        Intake/Output Summary (Last 24 hours) at 06/27/2021 1147 Last data filed at 06/27/2021 1019 Gross per 24 hour  Intake 1709.81 ml  Output 1252 ml  Net 457.81 ml   Last 3 Weights 06/26/2021 06/23/2021 06/02/2021  Weight (lbs) 150 lb 6.4 oz 148 lb 150 lb  Weight (kg) 68.221 kg 67.132 kg 68.04 kg      Telemetry    Atrial flutter.  PVCs. Rate <100 bpm. - Personally Reviewed  ECG    N/a - Personally Reviewed  Physical Exam   GEN: No acute distress.   Neck: No JVD Cardiac: Irregularly irregular. no murmurs, rubs, or gallops.  Respiratory: Bilateral rhonchi. GI: Soft, nontender, non-distended  MS: No edema; No deformity. Neuro:  Nonfocal  Psych: Normal affect   Labs    High  Sensitivity Troponin:   Recent Labs  Lab 06/23/21 1459 06/23/21 1726 06/24/21 0627  TROPONINIHS 26* 29* 26*     Chemistry Recent Labs  Lab 06/23/21 1459 06/24/21 0011 06/24/21 0627 06/25/21 0537 06/27/21 0552  NA 137   < > 136 137 134*  K 2.8*   < > 3.7 4.4 4.1  CL 100   < > 100 102 98  CO2 26   < > 29 31 29   GLUCOSE 124*   < > 94 102* 99  BUN 17   < > 15 12 14   CREATININE 1.16*   < > 1.11* 1.01* 1.04*  CALCIUM 8.8*   < > 8.7* 8.7* 9.0  MG 1.7  --  1.9  --   --   PROT 7.7  --   --   --   --   ALBUMIN 3.4*  --   --   --   --   AST 22  --   --   --   --   ALT 8  --   --   --   --   ALKPHOS 82  --   --   --   --   BILITOT 1.5*  --   --   --   --  GFRNONAA 47*   < > 49* 55* 53*  ANIONGAP 11   < > 7 4* 7   < > = values in this interval not displayed.    Lipids No results for input(s): CHOL, TRIG, HDL, LABVLDL, LDLCALC, CHOLHDL in the last 168 hours.  Hematology Recent Labs  Lab 06/24/21 0627 06/26/21 0647 06/27/21 0552  WBC 5.2 6.0 5.0  RBC 3.70* 3.71* 3.77*  HGB 11.0* 11.3* 11.3*  HCT 35.6* 35.0* 35.7*  MCV 96.2 94.3 94.7  MCH 29.7 30.5 30.0  MCHC 30.9 32.3 31.7  RDW 17.9* 17.6* 17.5*  PLT 215 214 216   Thyroid No results for input(s): TSH, FREET4 in the last 168 hours.  BNP Recent Labs  Lab 06/23/21 1103 06/23/21 1459 06/26/21 0919  BNP 2,164.2* 2,537.6* 644.6*    DDimer No results for input(s): DDIMER in the last 168 hours.   Radiology    No results found.  Cardiac Studies   Echo 06/24/21: IMPRESSIONS    1. Left ventricular ejection fraction, by estimation, is 40 to 45%. The  left ventricle has mild to moderately decreased function. The left  ventricle demonstrates global hypokinesis. Left ventricular diastolic  parameters are indeterminate.   2. Right ventricular systolic function is low normal. The right  ventricular size is normal. There is moderately elevated pulmonary artery  systolic pressure.   3. The mitral valve is degenerative.  Trivial mitral valve regurgitation.  Moderate mitral annular calcification.   4. The aortic valve was not well visualized. Aortic valve regurgitation  is not visualized.   5. The inferior vena cava is dilated in size with <50% respiratory  variability, suggesting right atrial pressure of 15 mmHg.   Patient Profile     85 y.o. female with COPD on home O2, stage III squamous cell lung carcinoma, prior tobacco abuse, hypertension, PAF/flutter, CAD status post CABG, pacemaker, and chronic diastolic heart failure admitted with bronchitis and acute on chronic diastolic heart failure.  Assessment & Plan    # Acute on chronic diastolic heart failure: # Bronchitis:  # Acute on chronic hypoxic respiratory failure: LVEF this admission is 40 to 45%, down from 50 to 55% 02/2020.  She is euvolemic on exam and remains hypotensive.  Therefore her home antihypertensives and heart failure therapy has been held, as has Lasix.  Yesterday she was +457 mL.  She is net -1 L since admission.  For now it seems that her hypoxia is more related to her bronchitis.  Appreciate management with antibiotics per the primary team.  Her Delene Loll is on hold.  She has no ischemic symptoms.    # Paroxysmal atrial fibrillation/flutter:  She is currently in atrial flutter.  Rate is controlled.  Continue Eliquis and metoprolol as BP permits.   # CAD s/p CABG:  Continue aspirin and rosuvastatin.  Metoprolol as BP permits.    For questions or updates, please contact [place] Please consult [email or web address] for contact info under        Signed, Skeet Latch, MD  06/27/2021, 11:47 AM

## 2021-06-27 NOTE — Progress Notes (Addendum)
PROGRESS NOTE    Jenny Cooper  PPI:[id number] DOB: 06-08-1936 DOA: 06/23/2021 PCP: Glean Hess, MD    Brief Narrative:  Jenny Cooper is a 85 y.o. female with medical history significant for paroxysmal atrial fibrillation, history of squamous cell lung carcinoma stage III, status post chemoradiation therapy in 2016, on chronic anticoagulation and amiodarone therapy, CAD status post CABG in 2002, former tobacco use, COPD, history of left lower extremity DVT, hyperlipidemia, hypertension, hypothyroid, ischemic cardiomyopathy, mitral regurgitation, neuropathy,.She was told to come to the ED for further evaluation due to the low potassium.    10/28 reported coughing with phlegm. From sob stand point doing better. Bp low 10/29 hypotensive , sbp 80's. No dizziness . Was started on zpack for bronchitis/cough and reports it appears to be little better. 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 Encouraged pt to make sure to f/u.  Pt also reports when she was diagnosed with lung cancer, she had similar coughing. After much discussion, have decided to obtain ct chest in am.    Consultants:  Cardiology  Procedures:   Antimicrobials:   zpack   Subjective: Sob at baseline. No cp. Cough mildly improved.   Objective: Vitals:   06/27/21 0255 06/27/21 0555 06/27/21 0755 06/27/21 0808  BP: (!) 89/52 (!) 94/56 94/60   Pulse:   67   Resp: 16 16 18    Temp: 97.9 F (36.6 C) 97.8 F (36.6 C) 97.6 F (36.4 C)   TempSrc: Oral Oral    SpO2: 98% 96% 95% 95%  Weight:        Intake/Output Summary (Last 24 hours) at 06/27/2021 0855 Last data filed at 06/27/2021 0755 Gross per 24 hour  Intake 1709.81 ml  Output 1252 ml  Net 457.81 ml   Filed Weights   06/26/21 0840  Weight: 68.2 kg    Examination: Nad, calm Scattered minimal crackles no wheezing Reg s1/s2 no gallop Soft benign  +bs No edema Awake and oriented x3 Mood and affect appropriate in current setting   Data Reviewed: I have personally reviewed following labs and imaging studies  CBC: Recent Labs  Lab 06/23/21 1103 06/23/21 1459 06/24/21 0627 06/26/21 0647 06/27/21 0552  WBC 6.9 6.4 5.2 6.0 5.0  NEUTROABS  --  4.5  --   --   --   HGB 11.5* 11.0* 11.0* 11.3* 11.3*  HCT 36.2 34.5* 35.6* 35.0* 35.7*  MCV 98.1 97.2 96.2 94.3 94.7  PLT 223 230 215 214 063   Basic Metabolic Panel: Recent Labs  Lab 06/23/21 1459 06/24/21 0011 06/24/21 0627 06/25/21 0537 06/27/21 0552  NA 137 137 136 137 134*  K 2.8* 4.3 3.7 4.4 4.1  CL 100 102 100 102 98  CO2 26 30 29 31 29   GLUCOSE 124* 103* 94 102* 99  BUN 17 17 15 12 14   CREATININE 1.16* 1.13* 1.11* 1.01* 1.04*  CALCIUM 8.8* 8.6* 8.7* 8.7* 9.0  MG 1.7  --  1.9  --   --   PHOS  --   --  2.6  --   --    GFR: Estimated Creatinine Clearance: 38.4 mL/min (A) (by C-G formula based on SCr of 1.04 mg/dL (H)). Liver Function Tests: Recent Labs  Lab 06/23/21 1459  AST 22  ALT 8  ALKPHOS 82  BILITOT 1.5*  PROT 7.7  ALBUMIN 3.4*   No  results for input(s): LIPASE, AMYLASE in the last 168 hours. No results for input(s): AMMONIA in the last 168 hours. Coagulation Profile: No results for input(s): INR, PROTIME in the last 168 hours. Cardiac Enzymes: No results for input(s): CKTOTAL, CKMB, CKMBINDEX, TROPONINI in the last 168 hours. BNP (last 3 results) No results for input(s): PROBNP in the last 8760 hours. HbA1C: No results for input(s): HGBA1C in the last 72 hours. CBG: No results for input(s): GLUCAP in the last 168 hours. Lipid Profile: No results for input(s): CHOL, HDL, LDLCALC, TRIG, CHOLHDL, LDLDIRECT in the last 72 hours. Thyroid Function Tests: No results for input(s): TSH, T4TOTAL, FREET4, T3FREE, THYROIDAB in the last 72 hours. Anemia Panel: No results for input(s): VITAMINB12, FOLATE, FERRITIN, TIBC, IRON, RETICCTPCT in the last 72  hours. Sepsis Labs: Recent Labs  Lab 06/23/21 1726  PROCALCITON <0.10    Recent Results (from the past 240 hour(s))  Resp Panel by RT-PCR (Flu A&B, Covid) Nasopharyngeal Swab     Status: None   Collection Time: 06/23/21  5:26 PM   Specimen: Nasopharyngeal Swab; Nasopharyngeal(NP) swabs in vial transport medium  Result Value Ref Range Status   SARS Coronavirus 2 by RT PCR NEGATIVE NEGATIVE Final    Comment: (NOTE) SARS-CoV-2 target nucleic acids are NOT DETECTED.  The SARS-CoV-2 RNA is generally detectable in upper respiratory specimens during the acute phase of infection. The lowest concentration of SARS-CoV-2 viral copies this assay can detect is 138 copies/mL. A negative result does not preclude SARS-Cov-2 infection and should not be used as the sole basis for treatment or other patient management decisions. A negative result may occur with  improper specimen collection/handling, submission of specimen other than nasopharyngeal swab, presence of viral mutation(s) within the areas targeted by this assay, and inadequate number of viral copies(<138 copies/mL). A negative result must be combined with clinical observations, patient history, and epidemiological information. The expected result is Negative.  Fact Sheet for Patients:  EntrepreneurPulse.com.au  Fact Sheet for Healthcare Providers:  IncredibleEmployment.be  This test is no t yet approved or cleared by the [place] FDA and  has been authorized for detection and/or diagnosis of SARS-CoV-2 by FDA under an Emergency Use Authorization (EUA). This EUA will remain  in effect (meaning this test can be used) for the duration of the COVID-19 declaration under Section 564(b)(1) of the Act, 21 U.S.C.section 360bbb-3(b)(1), unless the authorization is terminated  or revoked sooner.       Influenza A by PCR NEGATIVE NEGATIVE Final   Influenza B by PCR NEGATIVE NEGATIVE Final     Comment: (NOTE) The Xpert Xpress SARS-CoV-2/FLU/RSV plus assay is intended as an aid in the diagnosis of influenza from Nasopharyngeal swab specimens and should not be used as a sole basis for treatment. Nasal washings and aspirates are unacceptable for Xpert Xpress SARS-CoV-2/FLU/RSV testing.  Fact Sheet for Patients: EntrepreneurPulse.com.au  Fact Sheet for Healthcare Providers: IncredibleEmployment.be  This test is not yet approved or cleared by the [place] FDA and has been authorized for detection and/or diagnosis of SARS-CoV-2 by FDA under an Emergency Use Authorization (EUA). This EUA will remain in effect (meaning this test can be used) for the duration of the COVID-19 declaration under Section 564(b)(1) of the Act, 21 U.S.C. section 360bbb-3(b)(1), unless the authorization is terminated or revoked.  Performed at [facility], [address], [place], [place] [address]          Radiology Studies: No results found.  Scheduled Meds:  apixaban  5 mg Oral BID   azithromycin  250 mg Oral Daily   budesonide  0.5 mg Nebulization Daily   furosemide  20 mg Oral BID   levothyroxine  88 mcg Oral Q0600   metoprolol succinate  25 mg Oral Daily   polyethylene glycol  17 g Oral Daily   potassium chloride  40 mEq Oral Daily   revefenacin  175 mcg Nebulization Daily   rosuvastatin  5 mg Oral QHS   sodium phosphate  1 enema Rectal Once   vitamin B-12  1,000 mcg Oral Daily   Continuous Infusions:  sodium chloride Stopped (06/23/21 2243)    Assessment & Plan:   Principal Problem:   Hypokalemia Active Problems:   Essential hypertension   Hypothyroidism (acquired)   Mitral regurgitation   HLD (hyperlipidemia)   Chronic obstructive pulmonary disease (HCC)   Hx of adenomatous colonic polyps   Atrial flutter (HCC)   PAF (paroxysmal atrial fibrillation) (HCC)   Sinus node dysfunction (HCC)   Recurrent pleural  effusion on right   Pulmonary hypertension, unspecified (HCC)   Chronic respiratory failure with hypoxia (HCC)   Squamous cell lung cancer (HCC)   Status post reverse arthroplasty of shoulder, right   Acute exacerbation of CHF (congestive heart failure) (HCC)   Troponin level elevated   CHF (congestive heart failure) (HCC)   # Severe hypokalemia - Replaced and stable  Acute on chronic combined systolic and diastolic heart failure  EF 40 to 45% Receiving IV Lasix, transitioning to p.o. in am BNP down, clinically improving Daily weight 10/29 hypotensive today with systolic in the 85I.  Asymptomatic while sitting.   Will hold Lasix today's dose  Continue to hold Entresto due to low blood pressure  Continue Toprol-XL if BP allows  On discharge can do Lasix 20 mg twice daily and take extra dose of 20 if weight up by 3 pounds  Follow-up with heart failure clinic as outpatient   Acute on chronic bronchitis  Having more cough/sputum.  10/29 symptoms improving after initiation of Z-Pak.  However patient is concerned that her coughing is very similar to when she was diagnosed with stage III small cell carcinoma of the lung in the past. Due to COVID it appears they have not followed up with oncology.  Was told that needs to follow-up with oncology as outpatient Will obtain CT of the chest for evaluation while she is here due to concerns of her coughing.   #h/xo afib/flutter -DC'd, started on Eliquis Continue Toprol-XL if BP allows  # Acute kidney injury-presumed secondary to cardiorenal Continue to hold Entresto  Creatinine overall improved     # Elevated troponin History of CAD/CABG- Likely due to demand ischemia due to to acute CHF.   10/27 Eliquis transition to IV heparin in case needs ischemic work-up  Hypotension Likely due to diuresis Now euvolemic Holding lasix Decrease Toprol xl to 12.5 mg qd with parameters  #Constipation Likely from pain meds bowel regimen and  enemas Appears to have improved.    # Squamous cell carcinoma of the right lung-stage IIIa - Status post concurrent chemotherapy and radiation in 2016 with carboplatin and paclitaxel CT chest here since with increased coughing similar to her sx in the past when was dx with lung CA.      # Hyperlipidemia-continue statins  # Hypertension-continue beta-blockers , dose decreased.   # Hypothyroid-continue levothyroxine     DVT prophylaxis: eliquis Code Status: Full Family Communication: daughter at  bedside, had extensive discussion about treatment plans, answering all questions. Disposition Plan: Back home Status is: Inpatient  Patient remains inpatient as she requiring IV treatment for stability of her hospitalization. Hypotensive . Hold cardiac meds until bp improves.           LOS: 2 days   Time spent: 45 minutes with more than 50% on [place], MD [facility] Pager [phone]  If 7PM-7AM, please contact night-coverage 06/27/2021, 8:55 AM

## 2021-06-27 NOTE — Consult Note (Signed)
ANTICOAGULATION CONSULT NOTE - Initial Consult  Pharmacy Consult for Heparin infusion Indication: atrial fibrillation  Allergies  Allergen Reactions   Cefdinir Rash    Had hives after completing treatment - unsure if it was the cause.  Has received 1st generation cephalosporin many times (04/24/2012, 07/27/2013, 09/28/2016, 02/20/2018, 07/11/2018, 08/10/2018 without documented ADRs.   Lovenox [Enoxaparin Sodium] Itching   Meperidine Other (See Comments)    Other Reaction: pt does not like how it makes her feel Other reaction(s): Other (See Comments) Other Reaction: pt does not like how it makes her feel    Patient Measurements: Weight: 68.2 kg (150 lb 6.4 oz) Heparin Dosing Weight: 67.1 kg   Vital Signs: Temp: 97.9 F (36.6 C) (10/29 0255) Temp Source: Oral (10/29 0255) BP: 89/52 (10/29 0255) Pulse Rate: 104 (10/28 2351)  Labs: Recent Labs    06/25/21 0537 06/25/21 1031 06/26/21 0647 06/26/21 1444 06/27/21 0552  HGB  --   --  11.3*  --  11.3*  HCT  --   --  35.0*  --  35.7*  PLT  --   --  214  --  216  APTT  --    < > 102* 86* 132*  HEPARINUNFRC  --   --  >1.10*  --  >1.10*  CREATININE 1.01*  --   --   --  1.04*   < > = values in this interval not displayed.     Estimated Creatinine Clearance: 38.4 mL/min (A) (by C-G formula based on SCr of 1.04 mg/dL (H)).   Medical History: Past Medical History:  Diagnosis Date   Acute midline low back pain without sciatica 08/13/2016   Acute on chronic respiratory failure with hypoxia ([place]) 08/21/2018   Acute respiratory failure with hypoxia (HCC) 12/11/2017   Allergy    Aortic atherosclerosis (HCC)    Atypical atrial flutter ([place])    a.) s/p ablation 07/27/2013 and 05/10/2016   Balance problem    CAD (coronary artery disease)    a. s/p MI x 2 in 2002 s/p PCI x 2 in 2002; b. s/p 2v CABG 2002; c. stress echo 07/2004 w/ evi of pos & inf infarct & no evi of ischemia; d. 4/08 dipyridamole scan w/ multiple areas of infarct,  no ischemia, EF 49%; e. cath 04/28/15 3v CAD, med Rx rec, no targets for revasc, LM lum irregs, pLAD 30%, 100%, ost-pLCx 60%, [name] 99%, OM2 100%, p-mRCA 90%, [name] 100% L-R collats, VG-mLAD irregs, VG-OM2 oc   Chronic anticoagulation    a.) Apixaban   Chronic systolic CHF (congestive heart failure) ([place])    a. echo 03/2015: EF 30-35%, sev ant/inf/pos HK, in mild to mod MR   Complication of anesthesia    a.) postoperative apnea. b.) postoperative hypoxia.   Compressed spine fracture ([place]) 08/06/2016   a.) L2, T11, T12   COPD (chronic obstructive pulmonary disease) (HCC)    Deep vein thrombosis (DVT) of left lower extremity ([place]) 12/2001   Fracture of multiple pubic rami, right, closed, initial encounter ([place]) 05/27/2016   a.) RIGHT superior and inferior pubic rami fractures s/p mechanical fall.   GERD (gastroesophageal reflux disease)    HLD (hyperlipidemia)    HTN (hypertension)    Hypothyroidism    Ischemic cardiomyopathy    Mitral regurgitation    a. s/p mitral ring placement 09/2000; b. echo 09/2010: EF 50%, inf HK, post AK, mild MR, prosthetic mitral valve ring w/ peak gradient of 10 mmHg; b. echo 2/13: EF 50%, mild  MR/TR      Myocardial infarction [facility]) 2002   a.) x 2 in 2002; PCI with stents (unknown type) placed to the p-mRCA, [name], and [name].   Neuropathy    Osteoarthritis    Osteoporosis    Oxygen dependent    a.) 3 L/[place]   Pacemaker    a. MDT 2002; b. generator replacement 2013; c. followed by Dr. Omelia Blackwater, MD   PAF (paroxysmal atrial fibrillation) [facility])    a.) s/p DCCV on 04/08/2017. b.) on apixaban   Personal history of chemotherapy    Personal history of radiation therapy    Pneumonia    S/P CABG x 2 2002   a.) SVG-LAD, SVG-OM   Squamous cell carcinoma of right lung ([place]) 01/03/2015   a.) stage IIIa. b.) s/p concurrent chemotherapy (carboplatin + paclitaxel) and XRT (IMRT 6000 cGy)   SVT (supraventricular tachycardia) (HCC)    Type II diabetes mellitus with  complication ([place]) 99/24/2683    Medications:  Apixaban 2.5 mg BID: Last dose 10/27 0930  Assessment: 85 y.o. female with a history of paroxysmal atrial fibrillation/flutter on chronic apixaban presented to ED with SOB. Pharmacy has been consulted to transition to heparin therapy for possible further cardiac workup.   Date Time aPTT/HL Rate/comment 10/28 0647 102/>1.10 1000 un/hr, aPTT therapeutic  Goal of Therapy:  Heparin level 0.3-0.7 units/ml aPTT 66-102 seconds Monitor platelets by anticoagulation protocol: Yes   Plan:  10/29 @ 0552:  HL = > 1.10,  aPTT = 132 HL and aPTT still not therapeutic, aPTT elevated.  Will hold heparin drip for 1 hr and restart at 0800 @ 750 units/hr. Will recheck HL 8 hrs after restart.    Len[place] D, PharmD Clinical Pharmacist   06/27/2021,6:53 AM

## 2021-06-28 ENCOUNTER — Inpatient Hospital Stay: Payer: Medicare Other

## 2021-06-28 DIAGNOSIS — E876 Hypokalemia: Secondary | ICD-10-CM | POA: Diagnosis not present

## 2021-06-28 LAB — POTASSIUM: Potassium: 4.2 mmol/L (ref 3.5–5.1)

## 2021-06-28 LAB — CREATININE, SERUM
Creatinine, Ser: 1.1 mg/dL — ABNORMAL HIGH (ref 0.44–1.00)
GFR, Estimated: 50 mL/min — ABNORMAL LOW (ref >60)

## 2021-06-28 MED ORDER — IOHEXOL 300 MG/ML  SOLN
75.0000 mL | Freq: Once | INTRAMUSCULAR | Status: AC | PRN
  Administered 2021-06-28: 75 mL via INTRAVENOUS

## 2021-06-28 MED ORDER — FUROSEMIDE 20 MG PO TABS
20.0000 mg | ORAL_TABLET | Freq: Two times a day (BID) | ORAL | Status: DC
  Administered 2021-06-29: 20 mg via ORAL
  Filled 2021-06-28: qty 1

## 2021-06-28 NOTE — Evaluation (Signed)
Physical Therapy Evaluation Patient Details Name: Jenny Cooper MRN: [id number] DOB: 12-31-1935 Today's Date: 06/28/2021  History of Present Illness  Pt is an 85 y/o F admitted on 06/23/21 after being advised to come to the ED 2/2 low K+. PMH: paroxysmal a-fib, squamous cell lung carcinoma stage 3, s/p chemoradiation therapy in 2016, on chronic anticoagulation & amiodarone therapy, CAD s/p CABG in 2002, former tobacco use, COPD, LLE DVT, HLD, HTN, hypothyroid, ischemic cardiomyopathy, mitral regurgitation, neuropathy  Clinical Impression  Pt seen for PT evaluation with daughter present for session. Pt reports she lives with her son & her adult children provide HHA for mobility as she prefers this over using a cane. On this date, pt performs bed mobility, sit<>stand, & gait around unit with LUE HHA & min assist. Pt is on 3L/min at rest at beginning & end of session but does require increase to 4L/min with gait (pt reports this is normal for her). PT educates pt on pursed lip breathing & pt requires extra time to recover to >90% on 3L at end of session. Anticipate pt can d/c home with HHPT f/u. Will continue to follow pt acutely to progress gait with LRAD, stair negotiation, and balance.   BP checked in LUE: Supine: 105/74 mmHg (MAP 83) Sitting: 110/68 mmHg (MAP 79) Standing at 0: 122/90 mmHg (MAP 75) No c/o dizziness or adverse symptoms with positional changes. SpO2 dropped as low as 85% during session but SpO2 = 90% upon PT departure with pt on 3L/min.        Recommendations for follow up therapy are one component of a multi-disciplinary discharge planning process, led by the attending physician.  Recommendations may be updated based on patient status, additional functional criteria and insurance authorization.  Follow Up Recommendations Home health PT    Assistance Recommended at Discharge Frequent or constant Supervision/Assistance  Functional Status Assessment Patient has had a recent  decline in their functional status and demonstrates the ability to make significant improvements in function in a reasonable and predictable amount of time.  Equipment Recommendations  None recommended by PT    Recommendations for Other Services       Precautions / Restrictions Precautions Precautions: Shoulder Type of Shoulder Precautions: total Restrictions Weight Bearing Restrictions: Yes RUE Weight Bearing: Non weight bearing      Mobility  Bed Mobility Overal bed mobility: Needs Assistance Bed Mobility: Supine to Sit     Supine to sit: Min assist     General bed mobility comments: holds to PT's hand to stabilize & pull to upright sitting    Transfers Overall transfer level: Needs assistance Equipment used: 1 person hand held assist Transfers: Sit to/from Stand Sit to Stand: Min assist           General transfer comment: Pt prefers to hold to PT's hand & pull to standing (reports this is how she does it at home) with min assist.    Ambulation/Gait Ambulation/Gait assistance: Min assist;Min guard Gait Distance (Feet): 150 Feet Assistive device: 1 person hand held assist Gait Pattern/deviations: Decreased step length - right;Decreased stride length;Decreased step length - left Gait velocity: decreased   General Gait Details: Pt ambulates 1 lap around nurses station with LUE HHA & CGA<>min assist with 1 standing rest break to attempt to recover SpO2 levels.  Stairs            Wheelchair Mobility    Modified Rankin (Stroke Patients Only)       Balance Overall balance  assessment: Needs assistance Sitting-balance support: Feet supported Sitting balance-Leahy Scale: Normal     Standing balance support: Single extremity supported;During functional activity Standing balance-Leahy Scale: Fair Standing balance comment: min assist with LUE HHA                             Pertinent Vitals/Pain Pain Assessment: No/denies pain    Home  Living Family/patient expects to be discharged to:: Private residence Living Arrangements:  (son) Available Help at Discharge: Family;Available 24 hours/day (son & daughters can provide 24 hr supervision) Type of Home: House Home Access: Stairs to enter Entrance Stairs-Rails: Psychiatric nurse of Steps: 4   Home Layout: Able to live on main level with bedroom/bathroom Home Equipment:  (cane)      Prior Function               Mobility Comments: since shoulder surgery pt was receiving HHPT/HHOT services, family providing HHA as pt preferred this over a cane as it felt more stable       Hand Dominance        Extremity/Trunk Assessment   Upper Extremity Assessment Upper Extremity Assessment:  (RUE in sling)    Lower Extremity Assessment Lower Extremity Assessment: Generalized weakness       Communication   Communication: No difficulties  Cognition Arousal/Alertness: Awake/alert Behavior During Therapy: WFL for tasks assessed/performed Overall Cognitive Status: Within Functional Limits for tasks assessed                                          General Comments      Exercises     Assessment/Plan    PT Assessment Patient needs continued PT services  PT Problem List Decreased strength;Decreased range of motion;Decreased activity tolerance;Decreased mobility;Decreased balance;Decreased coordination;Decreased knowledge of use of DME;Decreased safety awareness;Pain;Cardiopulmonary status limiting activity;Decreased knowledge of precautions       PT Treatment Interventions DME instruction;Therapeutic activities;Modalities;Gait training;Patient/family education;Therapeutic exercise;Balance training;Stair training;Functional mobility training;Neuromuscular re-education;Manual techniques    PT Goals (Current goals can be found in the Care Plan section)  Acute Rehab PT Goals Patient Stated Goal: go home PT Goal Formulation: With  patient Time For Goal Achievement: 07/12/21 Potential to Achieve Goals: Good    Frequency Min 2X/week   Barriers to discharge        Co-evaluation               AM-PAC PT "6 Clicks" Mobility  Outcome Measure Help needed turning from your back to your side while in a flat bed without using bedrails?: A Little Help needed moving from lying on your back to sitting on the side of a flat bed without using bedrails?: A Little Help needed moving to and from a bed to a chair (including a wheelchair)?: A Little Help needed standing up from a chair using your arms (e.g., wheelchair or bedside chair)?: A Little Help needed to walk in hospital room?: A Little Help needed climbing 3-5 steps with a railing? : A Lot 6 Click Score: 17    End of Session Equipment Utilized During Treatment: Oxygen Activity Tolerance: Patient limited by fatigue Patient left: in chair;with call bell/phone within reach;with family/visitor present Nurse Communication: Mobility status (O2) PT Visit Diagnosis: Unsteadiness on feet (R26.81);Muscle weakness (generalized) (M62.81)    Time: 1450-1510 PT Time Calculation (min) (ACUTE ONLY): 20 min  Charges:   PT Evaluation $PT Eval Low Complexity: 1 Low PT Treatments $Therapeutic Activity: 8-22 mins        Lavone Nian, PT, DPT 06/28/21, 3:24 PM   Waunita Schooner 06/28/2021, 3:21 PM

## 2021-06-28 NOTE — Progress Notes (Signed)
PROGRESS NOTE    Jenny Cooper  VOZ:[id number] DOB: 05-06-1936 DOA: 06/23/2021 PCP: Glean Hess, MD    Brief Narrative:  Jenny Cooper is a 85 y.o. female with medical history significant for paroxysmal atrial fibrillation, history of squamous cell lung carcinoma stage III, status post chemoradiation therapy in 2016, on chronic anticoagulation and amiodarone therapy, CAD status post CABG in 2002, former tobacco use, COPD, history of left lower extremity DVT, hyperlipidemia, hypertension, hypothyroid, ischemic cardiomyopathy, mitral regurgitation, neuropathy,.She was told to come to the ED for further evaluation due to the low potassium.    10/28 reported coughing with phlegm. From sob stand point doing better. Bp low 10/29 hypotensive , sbp 80's. No dizziness . Was started on zpack for bronchitis/cough and reports it appears to be little better. 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 Encouraged pt to make sure to f/u.  Pt also reports when she was diagnosed with lung cancer, she had similar coughing. After much discussion, have decided to obtain ct chest in am.   10/30- coughing is getting better. Went down for CT this am. Bp low, holding lasix.  Consultants:  Cardiology  Procedures:   Antimicrobials:   zpack   Subjective: Sob improved, cough improving some. No cp  Objective: Vitals:   06/27/21 2356 06/28/21 0355 06/28/21 0726 06/28/21 0830  BP: 91/66 104/65 99/77   Pulse: 95  95   Resp: 18 16 19    Temp: 98.5 F (36.9 C) (!) 97.5 F (36.4 C) 98.1 F (36.7 C)   TempSrc:  Oral    SpO2: 96% 99% 94% 94%  Weight:  68.2 kg      Intake/Output Summary (Last 24 hours) at 06/28/2021 0846 Last data filed at 06/28/2021 0510 Gross per 24 hour  Intake 1157.71 ml  Output 1550 ml  Net -392.29 ml   Filed Weights   06/26/21 0840 06/28/21 0355  Weight: 68.2 kg 68.2 kg     Examination: Nad, calm Minimal scattered crackles at bases Regular s1/s2 no gallop Soft benign +bs No edema Aaxoxo3 grosslyintact   Data Reviewed: I have personally reviewed following labs and imaging studies  CBC: Recent Labs  Lab 06/23/21 1103 06/23/21 1459 06/24/21 0627 06/26/21 0647 06/27/21 0552  WBC 6.9 6.4 5.2 6.0 5.0  NEUTROABS  --  4.5  --   --   --   HGB 11.5* 11.0* 11.0* 11.3* 11.3*  HCT 36.2 34.5* 35.6* 35.0* 35.7*  MCV 98.1 97.2 96.2 94.3 94.7  PLT 223 230 215 214 425   Basic Metabolic Panel: Recent Labs  Lab 06/23/21 1459 06/24/21 0011 06/24/21 0627 06/25/21 0537 06/27/21 0552 06/28/21 0458  NA 137 137 136 137 134*  --   K 2.8* 4.3 3.7 4.4 4.1 4.2  CL 100 102 100 102 98  --   CO2 26 30 29 31 29   --   GLUCOSE 124* 103* 94 102* 99  --   BUN 17 17 15 12 14   --   CREATININE 1.16* 1.13* 1.11* 1.01* 1.04* 1.10*  CALCIUM 8.8* 8.6* 8.7* 8.7* 9.0  --   MG 1.7  --  1.9  --   --   --   PHOS  --   --  2.6  --   --   --    GFR: Estimated Creatinine Clearance: 36.3 mL/min (A) (by C-G formula based  on SCr of 1.1 mg/dL (H)). Liver Function Tests: Recent Labs  Lab 06/23/21 1459  AST 22  ALT 8  ALKPHOS 82  BILITOT 1.5*  PROT 7.7  ALBUMIN 3.4*   No results for input(s): LIPASE, AMYLASE in the last 168 hours. No results for input(s): AMMONIA in the last 168 hours. Coagulation Profile: No results for input(s): INR, PROTIME in the last 168 hours. Cardiac Enzymes: No results for input(s): CKTOTAL, CKMB, CKMBINDEX, TROPONINI in the last 168 hours. BNP (last 3 results) No results for input(s): PROBNP in the last 8760 hours. HbA1C: No results for input(s): HGBA1C in the last 72 hours. CBG: No results for input(s): GLUCAP in the last 168 hours. Lipid Profile: No results for input(s): CHOL, HDL, LDLCALC, TRIG, CHOLHDL, LDLDIRECT in the last 72 hours. Thyroid Function Tests: No results for input(s): TSH, T4TOTAL, FREET4, T3FREE, THYROIDAB in the last  72 hours. Anemia Panel: No results for input(s): VITAMINB12, FOLATE, FERRITIN, TIBC, IRON, RETICCTPCT in the last 72 hours. Sepsis Labs: Recent Labs  Lab 06/23/21 1726  PROCALCITON <0.10    Recent Results (from the past 240 hour(s))  Resp Panel by RT-PCR (Flu A&B, Covid) Nasopharyngeal Swab     Status: None   Collection Time: 06/23/21  5:26 PM   Specimen: Nasopharyngeal Swab; Nasopharyngeal(NP) swabs in vial transport medium  Result Value Ref Range Status   SARS Coronavirus 2 by RT PCR NEGATIVE NEGATIVE Final    Comment: (NOTE) SARS-CoV-2 target nucleic acids are NOT DETECTED.  The SARS-CoV-2 RNA is generally detectable in upper respiratory specimens during the acute phase of infection. The lowest concentration of SARS-CoV-2 viral copies this assay can detect is 138 copies/mL. A negative result does not preclude SARS-Cov-2 infection and should not be used as the sole basis for treatment or other patient management decisions. A negative result may occur with  improper specimen collection/handling, submission of specimen other than nasopharyngeal swab, presence of viral mutation(s) within the areas targeted by this assay, and inadequate number of viral copies(<138 copies/mL). A negative result must be combined with clinical observations, patient history, and epidemiological information. The expected result is Negative.  Fact Sheet for Patients:  EntrepreneurPulse.com.au  Fact Sheet for Healthcare Providers:  IncredibleEmployment.be  This test is no t yet approved or cleared by the [place] FDA and  has been authorized for detection and/or diagnosis of SARS-CoV-2 by FDA under an Emergency Use Authorization (EUA). This EUA will remain  in effect (meaning this test can be used) for the duration of the COVID-19 declaration under Section 564(b)(1) of the Act, 21 U.S.C.section 360bbb-3(b)(1), unless the authorization is terminated  or  revoked sooner.       Influenza A by PCR NEGATIVE NEGATIVE Final   Influenza B by PCR NEGATIVE NEGATIVE Final    Comment: (NOTE) The Xpert Xpress SARS-CoV-2/FLU/RSV plus assay is intended as an aid in the diagnosis of influenza from Nasopharyngeal swab specimens and should not be used as a sole basis for treatment. Nasal washings and aspirates are unacceptable for Xpert Xpress SARS-CoV-2/FLU/RSV testing.  Fact Sheet for Patients: EntrepreneurPulse.com.au  Fact Sheet for Healthcare Providers: IncredibleEmployment.be  This test is not yet approved or cleared by the [place] FDA and has been authorized for detection and/or diagnosis of SARS-CoV-2 by FDA under an Emergency Use Authorization (EUA). This EUA will remain in effect (meaning this test can be used) for the duration of the COVID-19 declaration under Section 564(b)(1) of the Act, 21 U.S.C. section 360bbb-3(b)(1), unless the authorization  is terminated or revoked.  Performed at [facility], [address], [place], [place] [address]          Radiology Studies: No results found.      Scheduled Meds:  apixaban  5 mg Oral BID   azithromycin  250 mg Oral Daily   budesonide  0.5 mg Nebulization Daily   furosemide  20 mg Oral BID   levothyroxine  88 mcg Oral Q0600   metoprolol succinate  12.5 mg Oral Daily   midodrine  2.5 mg Oral TID WC   polyethylene glycol  17 g Oral Daily   revefenacin  175 mcg Nebulization Daily   rosuvastatin  5 mg Oral QHS   sodium phosphate  1 enema Rectal Once   vitamin B-12  1,000 mcg Oral Daily   Continuous Infusions:  sodium chloride Stopped (06/23/21 2243)    Assessment & Plan:   Principal Problem:   Hypokalemia Active Problems:   Essential hypertension   Hypothyroidism (acquired)   Mitral regurgitation   HLD (hyperlipidemia)   Chronic obstructive pulmonary disease (HCC)   Hx of adenomatous colonic polyps   Atrial  flutter (HCC)   PAF (paroxysmal atrial fibrillation) (HCC)   Sinus node dysfunction (HCC)   Recurrent pleural effusion on right   Pulmonary hypertension, unspecified (HCC)   Chronic respiratory failure with hypoxia (HCC)   Squamous cell lung cancer (HCC)   Status post reverse arthroplasty of shoulder, right   Acute exacerbation of CHF (congestive heart failure) (HCC)   Troponin level elevated   CHF (congestive heart failure) ([place])   # Severe hypokalemia - Replaced and stable.  Acute on chronic combined systolic and diastolic heart failure  EF 40 to 45% Receiving IV Lasix, transitioning to p.o. in am BNP down, clinically improving Daily weight 10/29 hypotensive today with systolic in the 88B.  Asymptomatic while sitting.   10/30 hypotensive. Asx while in bed.  Will consult PT to have her ambulate and see if she is symptomatic with her blood pressure Holding Lasix today, continue to hold Entresto Continue Toprol-XL if BP allows On discharge can do Lasix 20 mg twice daily and take extra dose of 20 if weight up by 3 pounds Follow-up with heart failure clinic as outpatient     Acute on chronic bronchitis  Improving slowly with Z-Pak CT chest pending    #h/xo afib/flutter Continue Eliquis Continue Toprol-XL if BP allows  # Acute kidney injury-presumed secondary to cardiorenal Creatinine mildly up at 1.10 today likely due to her diuretics Holding diuretics today due to low BP Encourage p.o. intake for hydration especially since getting ct with contrast    # Elevated troponin History of CAD/CABG- Likely due to demand ischemia due to to acute CHF.   10/27 Eliquis transition to IV heparin in case needs ischemic work-up  Hypotension Likely due to diuresis Now euvolemic Holding lasix Decrease Toprol xl to 12.5 mg qd with parameters  #Constipation Likely from pain meds bowel regimen and enemas Appears to have improved.    # Squamous cell carcinoma of the right  lung-stage IIIa - Status post concurrent chemotherapy and radiation in 2016 with carboplatin and paclitaxel CT chest pending      # Hyperlipidemia-continue statins  # Hypertension-continue beta-blockers , dose decreased.   # Hypothyroid-continue levothyroxine     DVT prophylaxis: eliquis Code Status: Full Family Communication: daughter at bedside. Disposition Plan: Back home Status is: Inpatient  PT consulted. CT chest pending. Hypotensive. If clinically stable, creatinine stable  will dc tomorrow.           LOS: 3 days   Time spent: 35 minutes with more than 50% on [place], MD [facility] Pager [phone]  If 7PM-7AM, please contact night-coverage 06/28/2021, 8:46 AM

## 2021-06-29 ENCOUNTER — Inpatient Hospital Stay (HOSPITAL_COMMUNITY): Admission: RE | Admit: 2021-06-29 | Payer: Medicare Other | Source: Ambulatory Visit

## 2021-06-29 DIAGNOSIS — I4891 Unspecified atrial fibrillation: Secondary | ICD-10-CM | POA: Diagnosis not present

## 2021-06-29 DIAGNOSIS — E876 Hypokalemia: Secondary | ICD-10-CM | POA: Diagnosis not present

## 2021-06-29 DIAGNOSIS — I429 Cardiomyopathy, unspecified: Secondary | ICD-10-CM

## 2021-06-29 DIAGNOSIS — I5033 Acute on chronic diastolic (congestive) heart failure: Secondary | ICD-10-CM | POA: Diagnosis not present

## 2021-06-29 DIAGNOSIS — I4892 Unspecified atrial flutter: Secondary | ICD-10-CM | POA: Diagnosis not present

## 2021-06-29 LAB — CREATININE, SERUM
Creatinine, Ser: 0.99 mg/dL (ref 0.44–1.00)
GFR, Estimated: 57 mL/min — ABNORMAL LOW (ref >60)

## 2021-06-29 MED ORDER — POTASSIUM CHLORIDE CRYS ER 20 MEQ PO TBCR
20.0000 meq | EXTENDED_RELEASE_TABLET | Freq: Every day | ORAL | Status: DC
  Administered 2021-06-29: 20 meq via ORAL
  Filled 2021-06-29: qty 2

## 2021-06-29 MED ORDER — FUROSEMIDE 20 MG PO TABS: 20.0000 mg | ORAL_TABLET | Freq: Two times a day (BID) | ORAL | 0 refills | Status: DC

## 2021-06-29 MED ORDER — AZITHROMYCIN 250 MG PO TABS: 250.0000 mg | ORAL_TABLET | Freq: Every day | ORAL | 0 refills | Status: AC

## 2021-06-29 MED ORDER — MIDODRINE HCL 5 MG PO TABS
5.0000 mg | ORAL_TABLET | Freq: Three times a day (TID) | ORAL | Status: DC
  Administered 2021-06-29 (×2): 5 mg via ORAL
  Filled 2021-06-29 (×2): qty 1

## 2021-06-29 MED ORDER — POTASSIUM CHLORIDE CRYS ER 20 MEQ PO TBCR: 20.0000 meq | EXTENDED_RELEASE_TABLET | Freq: Every day | ORAL | 0 refills | Status: DC

## 2021-06-29 MED ORDER — METOPROLOL SUCCINATE ER 25 MG PO TB24: 12.5000 mg | ORAL_TABLET | Freq: Every day | ORAL | 0 refills | Status: DC

## 2021-06-29 MED ORDER — MIDODRINE HCL 5 MG PO TABS: 5.0000 mg | ORAL_TABLET | Freq: Three times a day (TID) | ORAL | 0 refills | Status: AC

## 2021-06-29 MED ORDER — POTASSIUM CHLORIDE CRYS ER 20 MEQ PO TBCR: 20.0000 meq | EXTENDED_RELEASE_TABLET | Freq: Every day | ORAL | Status: DC

## 2021-06-29 MED ORDER — APIXABAN 5 MG PO TABS: 5.0000 mg | ORAL_TABLET | Freq: Two times a day (BID) | ORAL | 0 refills | Status: DC

## 2021-06-29 NOTE — Discharge Summary (Signed)
Jenny Cooper:[id number] DOB: 1935-10-30 DOA: 06/23/2021  PCP: Glean Hess, MD  Admit date: 06/23/2021 Discharge date: 06/29/2021  Admitted From: home Disposition:  home  Recommendations for Outpatient Follow-up:  Follow up with PCP in 1 week Please obtain BMP/CBC in one week Please follow up cardiology on 11/8 Follow up with CHF clinic within 5 days Follow up with oncology in 2 weeks  Home Health:yes    Discharge Condition:Stable CODE STATUS:full  Diet recommendation: Heart Healthy  Brief/Interim Summary: Per XFG:[id number] Jenny Cooper is a 85 y.o. female with medical history significant for paroxysmal atrial fibrillation, history of squamous cell lung carcinoma stage III, status post chemoradiation therapy in 2016, on chronic anticoagulation and amiodarone therapy, CAD status post CABG in 2002, former tobacco use, COPD, history of left lower extremity DVT, hyperlipidemia, hypertension, hypothyroid, ischemic cardiomyopathy, mitral regurgitation, neuropathy was told to come to ED for evaluation of low potassium by her cardiologist.She reports she has been worsening shortness of breath for about 2.5 weeks. She denies known sick contacts.    She endorses swelling in her legs that started about 1 week ago. She does not weigh herself but endorses that she feels she has gain weight.   She was found with potassium of 2.8 on admission.  Was treated with potassium replacement and found with CHF which was treated with IV Lasix.  Cardiology was consulted.  During her hospitalization she began coughing and it appeared to be more bronchitis and was started on Z-Pak.  With her history of lung cancer a CT of the chest was obtained with results below, did not reveal recurrence of her cancer.  Her blood pressure medications were adjusted due to hypotension and she was started on midodrine.  She is stable today and euvolemic for discharge.   # Severe hypokalemia - Replaced and stable.   Acute  on chronic combined systolic and diastolic heart failure  EF 40 to 45% Receiving IV Lasix, transitioning to 20 mg p.o. twice daily, take extra 20 mg if weight is up by 3 pounds Her BNP was elevated during admission and decreased Cardiology was following. Added midodrine for hypotension. Her Toprol-XL was decreased to 12.5 mg due to hypotension.  Can reevaluate dosage on discharge by her cardiologist as outpatient. Follow-up with cardiology and heart failure clinic. Hold Entresto due to hypotension        Acute on chronic bronchitis  Improving with Z-Pak     #h/xo afib/flutter Continue Eliquis Continue Toprol-XL .   # Acute kidney injury- Improved .  Initially it was due to cardiorenal syndrome, then it increased due to diuresis.  Now normalized at baseline   # Elevated troponin History of CAD/CABG- Likely due to demand ischemia due to to acute CHF.         #Constipation Likely from pain meds Darted on bowel regimen and improved   # Squamous cell carcinoma of the right lung-stage IIIa - Status post concurrent chemotherapy and radiation in 2016 with carboplatin and paclitaxel CT chest obtained, with results below. RUL and post. Right mid lung with possible scarring/atelectasis over Malignancy. Attention to these areas on future scans recommended. F/u with Dr. Burlene Arnt in 2 weeks, oncology has been notified about the patient.      # Hyperlipidemia-continue statins  # Hypothyroid-continue levothyroxine       Discharge Diagnoses:  Principal Problem:   Hypokalemia Active Problems:   Essential hypertension   Hypothyroidism (acquired)   Mitral regurgitation   HLD (hyperlipidemia)  Chronic obstructive pulmonary disease (HCC)   Hx of adenomatous colonic polyps   Atrial flutter (HCC)   PAF (paroxysmal atrial fibrillation) (HCC)   Sinus node dysfunction (HCC)   Recurrent pleural effusion on right   Pulmonary hypertension, unspecified (HCC)   Chronic respiratory  failure with hypoxia (HCC)   Squamous cell lung cancer (HCC)   Status post reverse arthroplasty of shoulder, right   Acute exacerbation of CHF (congestive heart failure) (HCC)   Troponin level elevated   CHF (congestive heart failure) [facility])    Discharge Instructions  Discharge Instructions     Call MD for:  difficulty breathing, headache or visual disturbances   Complete by: As directed    Diet - low sodium heart healthy   Complete by: As directed    Discharge instructions   Complete by: As directed    Weigh daily, if wt up by 3lbs take extra dose of 20 mg of lasix   Increase activity slowly   Complete by: As directed    No wound care   Complete by: As directed       Allergies as of 06/29/2021       Reactions   Cefdinir Rash   Had hives after completing treatment - unsure if it was the cause. Has received 1st generation cephalosporin many times (04/24/2012, 07/27/2013, 09/28/2016, 02/20/2018, 07/11/2018, 08/10/2018 without documented ADRs.   Lovenox [enoxaparin Sodium] Itching   Meperidine Other (See Comments)   Other Reaction: pt does not like how it makes her feel Other reaction(s): Other (See Comments) Other Reaction: pt does not like how it makes her feel        Medication List     STOP taking these medications    Entresto 24-26 MG Generic drug: sacubitril-valsartan       TAKE these medications    acetaminophen 500 MG tablet Commonly known as: TYLENOL Take 500-1,000 mg by mouth every 4 (four) hours as needed for mild pain or moderate pain.   apixaban 5 MG Tabs tablet Commonly known as: ELIQUIS Take 1 tablet (5 mg total) by mouth 2 (two) times daily. What changed:  medication strength how much to take   azithromycin 250 MG tablet Commonly known as: ZITHROMAX Take 1 tablet (250 mg total) by mouth daily for 3 days.   budesonide 0.5 MG/2ML nebulizer solution Commonly known as: PULMICORT INHALE 2 MILLILITERS BY NEBULIZER 2 TIMES DAILY   CENTRUM  SILVER PO Take 1 tablet by mouth daily.   cetirizine 10 MG tablet Commonly known as: ZYRTEC TAKE (1) TABLET BY MOUTH EVERY DAY   fluticasone 50 MCG/ACT nasal spray Commonly known as: Flonase Place 2 sprays into both nostrils daily. What changed:  how much to take when to take this reasons to take this   furosemide 20 MG tablet Commonly known as: LASIX Take 1 tablet (20 mg total) by mouth 2 (two) times daily. TAKE 1 TABLET BY MOUTH TWICE DAILY, ALTERNATING WITH 1 EXTRA TABLET AS NEEDED when weight up by 3 lbs What changed: See the new instructions.   ipratropium-albuterol 0.5-2.5 (3) MG/3ML Soln Commonly known as: DUONEB TAKE 3 MLS BY NEBULIZATION EVERY 6 HOURSAS NEEDED FOR WHEEZING OR SHORTNESS OF BREATH   ketorolac 0.5 % ophthalmic solution Commonly known as: ACULAR Place 1 drop into the right eye in the morning, at noon, and at bedtime.   levothyroxine 88 MCG tablet Commonly known as: SYNTHROID TAKE (1) TABLET BY MOUTH EVERY DAY BEFORE BREAKFAST   metoprolol succinate 25 MG  24 hr tablet Commonly known as: TOPROL-XL Take 0.5 tablets (12.5 mg total) by mouth daily. What changed:  how much to take when to take this   midodrine 5 MG tablet Commonly known as: PROAMATINE Take 1 tablet (5 mg total) by mouth 3 (three) times daily with meals.   Mucinex 600 MG 12 hr tablet Generic drug: guaiFENesin Take 1 tablet (600 mg total) by mouth 2 (two) times daily as needed.   oxyCODONE 5 MG immediate release tablet Commonly known as: Oxy IR/ROXICODONE Take 1 tablet (5 mg total) by mouth every 4 (four) hours as needed for moderate pain.   OXYGEN Inhale 3 L into the lungs.   potassium chloride SA 20 MEQ tablet Commonly known as: KLOR-CON Take 1 tablet (20 mEq total) by mouth daily. Start taking on: June 30, 2021   rosuvastatin 5 MG tablet Commonly known as: CRESTOR TAKE ONE (1) TABLET BY MOUTH ONCE DAILY What changed: See the new instructions.   vitamin A & D  ointment Apply 1 application topically as needed (irritation).   vitamin B-12 1000 MCG tablet Commonly known as: CYANOCOBALAMIN Take 1,000 mcg by mouth daily.   Yupelri 175 MCG/3ML nebulizer solution Generic drug: revefenacin Use one vial in nebulizer once daily. Do not mix with other nebulized medications.        Follow-up Information     [place] Follow up in 5 day(s).   Specialty: Cardiology Contact information: [address] [place] [phone]        Minna Merritts, MD Follow up on 07/07/2021.   Specialty: Cardiology Why: @ 2:25pm Contact information: [place] [address] [phone]         Glean Hess, MD Follow up on 07/01/2021.   Specialty: Internal Medicine Why: needs.Marland Kitchen@ 2pm Contact information: [address] [place] [address] [phone]         Cammie Sickle, MD Follow up on 07/20/2021.   Specialties: Internal Medicine, Oncology Why: needs labs.Marland Kitchen@ 11:15am Contact information: [place] [address] [phone]                Allergies  Allergen Reactions   Cefdinir Rash    Had hives after completing treatment - unsure if it was the cause.  Has received 1st generation cephalosporin many times (04/24/2012, 07/27/2013, 09/28/2016, 02/20/2018, 07/11/2018, 08/10/2018 without documented ADRs.   Lovenox [Enoxaparin Sodium] Itching   Meperidine Other (See Comments)    Other Reaction: pt does not like how it makes her feel Other reaction(s): Other (See Comments) Other Reaction: pt does not like how it makes her feel    Consultations: cardiology   Procedures/Studies: DG Chest 2 View  Result Date: 06/23/2021 CLINICAL DATA:  Shortness of breath. EXAM: CHEST - 2 VIEW COMPARISON:  May 19, 2021. FINDINGS: Stable cardiomediastinal silhouette. Status post cardiac  valve repair. Right internal jugular Port-A-Cath is unchanged in position. Left-sided pacemaker is unchanged in position. Status post right shoulder arthroplasty. Stable bibasilar atelectasis or scarring is noted with small pleural effusions. Stable right perihilar opacity is noted most consistent with scarring or postoperative change. IMPRESSION: Stable bilateral lung opacities are noted compared to prior exam. Interval placement of right total shoulder arthroplasty. Electronically Signed   By: Marijo Conception M.D.   On: 06/23/2021 16:16   CT CHEST W CONTRAST  Result Date: 06/28/2021 CLINICAL DATA:  85 year old female with  cough and follow-up of RIGHT lung cancer. EXAM: CT CHEST WITH CONTRAST TECHNIQUE: Multidetector CT imaging of the chest was performed during intravenous contrast administration. CONTRAST:  20mL OMNIPAQUE IOHEXOL 300 MG/ML  SOLN COMPARISON:  02/18/2020 chest CT and prior studies FINDINGS: Cardiovascular: Cardiomegaly, pacemaker and mitral valve replacement again noted. Coronary artery and aortic atherosclerotic calcifications are again identified. There is no evidence of thoracic aortic aneurysm or pericardial effusion. No pulmonary emboli are identified. Mediastinum/Nodes: RIGHT hilar/perihilar soft tissue has slightly decreased since 02/18/2020. [place] mediastinal lymph nodes are unchanged. No new or enlarging lymph nodes are identified. The visualized thyroid gland, trachea and visualized esophagus are unremarkable. Lungs/Pleura: New peripheral focal opacities/consolidation within the RIGHT UPPER lung and posterior RIGHT mid lung noted-nonspecific and may represent infection or new scarring/atelectasis. Progression of malignancy is considered somewhat less likely. RIGHT perihilar opacities are stable to slightly decreased from the prior study. Interstitial prominence within the RIGHT lung is unchanged. No new suspicious pulmonary abnormalities are noted. LEFT LOWER lobe subsegmental  atelectasis/scarring is again identified. There is no evidence of pleural effusion or pneumothorax. Upper Abdomen: No acute abnormality identified. Findings suggestive of hepatic cirrhosis noted. Musculoskeletal: No acute or suspicious bony abnormalities are noted. Compression fracture/vertebral augmentation within the LOWER thoracic spine again noted. IMPRESSION: 1. New nonspecific peripheral focal opacities/consolidation within the RIGHT UPPER lung and posterior RIGHT mid lung - favor infection or scarring/atelectasis over malignancy. Attention to has areas on future scans recommended. 2. Slightly decreased RIGHT hilar/perihilar soft tissue and RIGHT perihilar opacities. 3. Cardiomegaly and coronary artery disease. 4. Probable cirrhosis. Electronically Signed   By: Margarette Canada M.D.   On: 06/28/2021 12:32   DG Shoulder Right Port  Result Date: 06/02/2021 CLINICAL DATA:  Right shoulder replacement. EXAM: PORTABLE RIGHT SHOULDER COMPARISON:  CT right shoulder dated May 25, 2021. FINDINGS: The right shoulder demonstrates a reverse total shoulder arthroplasty without evidence of hardware failure or complication. There is expected intra-articular air. Fractured greater tuberosity fragment remains. The alignment is anatomic. Post-surgical changes noted in the surrounding soft tissues. Right chest wall port catheter. IMPRESSION: 1. Reverse right total shoulder arthroplasty without evidence of acute postoperative complication. Electronically Signed   By: Titus Dubin M.D.   On: 06/02/2021 13:53   ECHOCARDIOGRAM COMPLETE  Result Date: 06/24/2021    ECHOCARDIOGRAM REPORT   Patient Name:   CAMMI CONSALVO Date of Exam: 06/24/2021 Medical Rec #:  [id number]        Height:       66.0 in Accession #:    [id number]       Weight:       148.0 lb Date of Birth:  1936/08/21        BSA:          1.760 m Patient Age:    85 years         BP:           108/63 mmHg Patient Gender: F                HR:           96 bpm.  Exam Location:  [facility] Procedure: 2D Echo, Color Doppler, Cardiac Doppler and Intracardiac            Opacification Agent Indications:     R06.00 Dyspnea  History:         Patient has prior history of Echocardiogram examinations. CHF,  CAD, Prior CABG and Pacemaker, COPD; Risk Factors:Diabetes,                  Hypertension and Dyslipidemia.  Sonographer:     Charmayne Sheer Referring Phys:  [id number] AMY N COX Diagnosing Phys: Kate Sable MD  Sonographer Comments: Technically difficult study due to poor echo windows. IMPRESSIONS  1. Left ventricular ejection fraction, by estimation, is 40 to 45%. The left ventricle has mild to moderately decreased function. The left ventricle demonstrates global hypokinesis. Left ventricular diastolic parameters are indeterminate.  2. Right ventricular systolic function is low normal. The right ventricular size is normal. There is moderately elevated pulmonary artery systolic pressure.  3. The mitral valve is degenerative. Trivial mitral valve regurgitation. Moderate mitral annular calcification.  4. The aortic valve was not well visualized. Aortic valve regurgitation is not visualized.  5. The inferior vena cava is dilated in size with <50% respiratory variability, suggesting right atrial pressure of 15 mmHg. FINDINGS  Left Ventricle: Left ventricular ejection fraction, by estimation, is 40 to 45%. The left ventricle has mild to moderately decreased function. The left ventricle demonstrates global hypokinesis. Definity contrast agent was given IV to delineate the left  ventricular endocardial borders. The left ventricular internal cavity size was normal in size. There is no left ventricular hypertrophy. Left ventricular diastolic parameters are indeterminate. Right Ventricle: The right ventricular size is normal. No increase in right ventricular wall thickness. Right ventricular systolic function is low normal. There is moderately elevated pulmonary artery systolic  pressure. The tricuspid regurgitant velocity  is 3.20 m/s, and with an assumed right atrial pressure of 15 mmHg, the estimated right ventricular systolic pressure is 40.9 mmHg. Left Atrium: Left atrial size was normal in size. Right Atrium: Right atrial size was normal in size. Pericardium: There is no evidence of pericardial effusion. Mitral Valve: The mitral valve is degenerative in appearance. Moderate mitral annular calcification. Trivial mitral valve regurgitation. MV peak gradient, 8.1 mmHg. The mean mitral valve gradient is 3.0 mmHg. Tricuspid Valve: The tricuspid valve is normal in structure. Tricuspid valve regurgitation is mild. Aortic Valve: The aortic valve was not well visualized. Aortic valve regurgitation is not visualized. Aortic valve mean gradient measures 3.0 mmHg. Aortic valve peak gradient measures 5.8 mmHg. Aortic valve area, by VTI measures 1.11 cm. Pulmonic Valve: The pulmonic valve was not well visualized. Pulmonic valve regurgitation is not visualized. Aorta: The aortic root is normal in size and structure. Venous: The inferior vena cava is dilated in size with less than 50% respiratory variability, suggesting right atrial pressure of 15 mmHg. IAS/Shunts: No atrial level shunt detected by color flow Doppler. Additional Comments: A device lead is visualized.  LEFT VENTRICLE PLAX 2D LVIDd:         3.61 cm   Diastology LVIDs:         2.86 cm   LV e' lateral:   5.98 cm/s LV PW:         0.90 cm   LV E/e' lateral: 17.4 LV IVS:        0.71 cm LVOT diam:     1.60 cm LV SV:         22 LV SV Index:   12 LVOT Area:     2.01 cm  LEFT ATRIUM         Index LA diam:    4.30 cm 2.44 cm/m  AORTIC VALVE  PULMONIC VALVE AV Area (Vmax):    0.97 cm     PV Vmax:       0.78 m/s AV Area (Vmean):   0.96 cm     PV Vmean:      48.600 cm/s AV Area (VTI):     1.11 cm     PV VTI:        0.106 m AV Vmax:           120.00 cm/s  PV Peak grad:  2.4 mmHg AV Vmean:          79.200 cm/s  PV Mean grad:   1.0 mmHg AV VTI:            0.193 m AV Peak Grad:      5.8 mmHg AV Mean Grad:      3.0 mmHg LVOT Vmax:         58.10 cm/s LVOT Vmean:        38.000 cm/s LVOT VTI:          0.107 m LVOT/AV VTI ratio: 0.55  AORTA Ao Root diam: 2.70 cm MITRAL VALVE                TRICUSPID VALVE MV Area (PHT): 4.39 cm     TR Peak grad:   41.0 mmHg MV Area VTI:   1.01 cm     TR Vmax:        320.00 cm/s MV Peak grad:  8.1 mmHg MV Mean grad:  3.0 mmHg     SHUNTS MV Vmax:       1.42 m/s     Systemic VTI:  0.11 m MV Vmean:      78.3 cm/s    Systemic Diam: 1.60 cm MV Decel Time: 173 msec MV E velocity: 104.00 cm/s Kate Sable MD Electronically signed by Kate Sable MD Signature Date/Time: 06/24/2021/5:26:25 PM    Final       Subjective: Feels better.  No shortness of breath.  No dizziness or lightheadedness.  Discharge Exam: Vitals:   06/29/21 0725 06/29/21 1148  BP: 117/77 116/73  Pulse: 80 (!) 104  Resp: 18 18  Temp: 97.9 F (36.6 C) 98 F (36.7 C)  SpO2: 95% 95%   Vitals:   06/28/21 2358 06/29/21 0452 06/29/21 0725 06/29/21 1148  BP: 122/74 117/80 117/77 116/73  Pulse: (!) 106 99 80 (!) 104  Resp: 17 17 18 18   Temp: 98.2 F (36.8 C) 98.5 F (36.9 C) 97.9 F (36.6 C) 98 F (36.7 C)  TempSrc:   Oral   SpO2: 97% 95% 95% 95%  Weight:        General: Pt is alert, awake, not in acute distress Cardiovascular: RRR, S1/S2 +, no rubs, no gallops Respiratory: CTA bilaterally, no wheezing, no rhonchi Abdominal: Soft, NT, ND, bowel sounds + Extremities: no edema, no cyanosis    The results of significant diagnostics from this hospitalization (including imaging, microbiology, ancillary and laboratory) are listed below for reference.     Microbiology: Recent Results (from the past 240 hour(s))  Resp Panel by RT-PCR (Flu A&B, Covid) Nasopharyngeal Swab     Status: None   Collection Time: 06/23/21  5:26 PM   Specimen: Nasopharyngeal Swab; Nasopharyngeal(NP) swabs in vial transport medium   Result Value Ref Range Status   SARS Coronavirus 2 by RT PCR NEGATIVE NEGATIVE Final    Comment: (NOTE) SARS-CoV-2 target nucleic acids are NOT DETECTED.  The SARS-CoV-2 RNA is generally detectable in upper  respiratory specimens during the acute phase of infection. The lowest concentration of SARS-CoV-2 viral copies this assay can detect is 138 copies/mL. A negative result does not preclude SARS-Cov-2 infection and should not be used as the sole basis for treatment or other patient management decisions. A negative result may occur with  improper specimen collection/handling, submission of specimen other than nasopharyngeal swab, presence of viral mutation(s) within the areas targeted by this assay, and inadequate number of viral copies(<138 copies/mL). A negative result must be combined with clinical observations, patient history, and epidemiological information. The expected result is Negative.  Fact Sheet for Patients:  EntrepreneurPulse.com.au  Fact Sheet for Healthcare Providers:  IncredibleEmployment.be  This test is no t yet approved or cleared by the [place] FDA and  has been authorized for detection and/or diagnosis of SARS-CoV-2 by FDA under an Emergency Use Authorization (EUA). This EUA will remain  in effect (meaning this test can be used) for the duration of the COVID-19 declaration under Section 564(b)(1) of the Act, 21 U.S.C.section 360bbb-3(b)(1), unless the authorization is terminated  or revoked sooner.       Influenza A by PCR NEGATIVE NEGATIVE Final   Influenza B by PCR NEGATIVE NEGATIVE Final    Comment: (NOTE) The Xpert Xpress SARS-CoV-2/FLU/RSV plus assay is intended as an aid in the diagnosis of influenza from Nasopharyngeal swab specimens and should not be used as a sole basis for treatment. Nasal washings and aspirates are unacceptable for Xpert Xpress SARS-CoV-2/FLU/RSV testing.  Fact Sheet for  Patients: EntrepreneurPulse.com.au  Fact Sheet for Healthcare Providers: IncredibleEmployment.be  This test is not yet approved or cleared by the [place] FDA and has been authorized for detection and/or diagnosis of SARS-CoV-2 by FDA under an Emergency Use Authorization (EUA). This EUA will remain in effect (meaning this test can be used) for the duration of the COVID-19 declaration under Section 564(b)(1) of the Act, 21 U.S.C. section 360bbb-3(b)(1), unless the authorization is terminated or revoked.  Performed at [facility], [address] [place], [place] [address]      Labs: BNP (last 3 results) Recent Labs    06/23/21 1103 06/23/21 1459 06/26/21 0919  BNP 2,164.2* 2,537.6* 038.8*   Basic Metabolic Panel: Recent Labs  Lab 06/23/21 1459 06/24/21 0011 06/24/21 8280 06/25/21 0537 06/27/21 0552 06/28/21 0458 06/29/21 0504  NA 137 137 136 137 134*  --   --   K 2.8* 4.3 3.7 4.4 4.1 4.2  --   CL 100 102 100 102 98  --   --   CO2 26 30 29 31 29   --   --   GLUCOSE 124* 103* 94 102* 99  --   --   BUN 17 17 15 12 14   --   --   CREATININE 1.16* 1.13* 1.11* 1.01* 1.04* 1.10* 0.99  CALCIUM 8.8* 8.6* 8.7* 8.7* 9.0  --   --   MG 1.7  --  1.9  --   --   --   --   PHOS  --   --  2.6  --   --   --   --    Liver Function Tests: Recent Labs  Lab 06/23/21 1459  AST 22  ALT 8  ALKPHOS 82  BILITOT 1.5*  PROT 7.7  ALBUMIN 3.4*   No results for input(s): LIPASE, AMYLASE in the last 168 hours. No results for input(s): AMMONIA in the last 168 hours. CBC: Recent Labs  Lab 06/23/21 1103 06/23/21 1459 06/24/21  5361 06/26/21 0647 06/27/21 0552  WBC 6.9 6.4 5.2 6.0 5.0  NEUTROABS  --  4.5  --   --   --   HGB 11.5* 11.0* 11.0* 11.3* 11.3*  HCT 36.2 34.5* 35.6* 35.0* 35.7*  MCV 98.1 97.2 96.2 94.3 94.7  PLT 223 230 215 214 216   Cardiac Enzymes: No results for input(s): CKTOTAL, CKMB, CKMBINDEX, TROPONINI in the last  168 hours. BNP: Invalid input(s): POCBNP CBG: No results for input(s): GLUCAP in the last 168 hours. D-Dimer No results for input(s): DDIMER in the last 72 hours. Hgb A1c No results for input(s): HGBA1C in the last 72 hours. Lipid Profile No results for input(s): CHOL, HDL, LDLCALC, TRIG, CHOLHDL, LDLDIRECT in the last 72 hours. Thyroid function studies No results for input(s): TSH, T4TOTAL, T3FREE, THYROIDAB in the last 72 hours.  Invalid input(s): FREET3 Anemia work up No results for input(s): VITAMINB12, FOLATE, FERRITIN, TIBC, IRON, RETICCTPCT in the last 72 hours. Urinalysis    Component Value Date/Time   COLORURINE YELLOW 05/28/2021 0931   APPEARANCEUR CLEAR 05/28/2021 0931   LABSPEC 1.015 05/28/2021 0931   PHURINE 5.5 05/28/2021 0931   GLUCOSEU NEGATIVE 05/28/2021 0931   HGBUR NEGATIVE 05/28/2021 [place] 05/28/2021 0931   BILIRUBINUR neg 11/21/2020 1351   KETONESUR NEGATIVE 05/28/2021 0931   PROTEINUR NEGATIVE 05/28/2021 0931   UROBILINOGEN 0.2 11/21/2020 1351   NITRITE NEGATIVE 05/28/2021 0931   LEUKOCYTESUR SMALL (A) 05/28/2021 0931   Sepsis Labs Invalid input(s): PROCALCITONIN,  WBC,  LACTICIDVEN Microbiology Recent Results (from the past 240 hour(s))  Resp Panel by RT-PCR (Flu A&B, Covid) Nasopharyngeal Swab     Status: None   Collection Time: 06/23/21  5:26 PM   Specimen: Nasopharyngeal Swab; Nasopharyngeal(NP) swabs in vial transport medium  Result Value Ref Range Status   SARS Coronavirus 2 by RT PCR NEGATIVE NEGATIVE Final    Comment: (NOTE) SARS-CoV-2 target nucleic acids are NOT DETECTED.  The SARS-CoV-2 RNA is generally detectable in upper respiratory specimens during the acute phase of infection. The lowest concentration of SARS-CoV-2 viral copies this assay can detect is 138 copies/mL. A negative result does not preclude SARS-Cov-2 infection and should not be used as the sole basis for treatment or other patient management  decisions. A negative result may occur with  improper specimen collection/handling, submission of specimen other than nasopharyngeal swab, presence of viral mutation(s) within the areas targeted by this assay, and inadequate number of viral copies(<138 copies/mL). A negative result must be combined with clinical observations, patient history, and epidemiological information. The expected result is Negative.  Fact Sheet for Patients:  EntrepreneurPulse.com.au  Fact Sheet for Healthcare Providers:  IncredibleEmployment.be  This test is no t yet approved or cleared by the [place] FDA and  has been authorized for detection and/or diagnosis of SARS-CoV-2 by FDA under an Emergency Use Authorization (EUA). This EUA will remain  in effect (meaning this test can be used) for the duration of the COVID-19 declaration under Section 564(b)(1) of the Act, 21 U.S.C.section 360bbb-3(b)(1), unless the authorization is terminated  or revoked sooner.       Influenza A by PCR NEGATIVE NEGATIVE Final   Influenza B by PCR NEGATIVE NEGATIVE Final    Comment: (NOTE) The Xpert Xpress SARS-CoV-2/FLU/RSV plus assay is intended as an aid in the diagnosis of influenza from Nasopharyngeal swab specimens and should not be used as a sole basis for treatment. Nasal washings and aspirates are unacceptable for Xpert Xpress SARS-CoV-2/FLU/RSV  testing.  Fact Sheet for Patients: EntrepreneurPulse.com.au  Fact Sheet for Healthcare Providers: IncredibleEmployment.be  This test is not yet approved or cleared by the [place] FDA and has been authorized for detection and/or diagnosis of SARS-CoV-2 by FDA under an Emergency Use Authorization (EUA). This EUA will remain in effect (meaning this test can be used) for the duration of the COVID-19 declaration under Section 564(b)(1) of the Act, 21 U.S.C. section 360bbb-3(b)(1), unless the  authorization is terminated or revoked.  Performed at [facility], [address], [place], [place] [address]      Time coordinating discharge: Over 30 minutes  SIGNED:   Nolberto Hanlon, MD  [facility] 06/29/2021, 3:26 PM Pager   If 7PM-7AM, please contact night-coverage [email or web address] Password [email or web address]

## 2021-06-29 NOTE — TOC Transition Note (Signed)
Transition of Care [facility]) - CM/SW Discharge Note   Patient Details  Name: DIAMOND JENTZ MRN: [id number] Date of Birth: 1935-09-09  Transition of Care [facility]) CM/SW Contact:  Alberteen Sam, LCSW Phone Number: 06/29/2021, 10:55 AM   Clinical Narrative:     Patient to discharge home with home health services with [facility], CSW has reached out to [place] with [facility] and informed of discharge today. Patient is active with [employer] for home oxygen. No other dc needs identified, family to pick up at discharge.   CSW signing off.   Final next level of care: [place] Barriers to Discharge: No Barriers Identified   Patient Goals and CMS Choice Patient states their goals for this hospitalization and ongoing recovery are:: to go home CMS Medicare.gov Compare Post Acute Care list provided to:: Patient Choice offered to / list presented to : Patient  Discharge Placement                Patient to be transferred to facility by: family   Patient and family notified of of transfer: 06/29/21  Discharge Plan and Services                          HH Arranged: PT, OT, RN [facility] Agency: [place] Date [place]: 06/29/21 Time [place]: 1055 Representative spoke with at [place]: [place]  Social Determinants of Health ([place]) Interventions     Readmission Risk Interventions Readmission Risk Prevention Plan 06/26/2021  Transportation Screening Complete  PCP or Specialist Appt within 3-5 Days Complete  HRI or Home Care Consult Complete  Social Work Consult for [place] Planning/Counseling Complete  Palliative Care Screening Not Applicable  Medication Review Press photographer) Complete  Some recent data might be hidden

## 2021-06-29 NOTE — Progress Notes (Signed)
Progress Note  Patient Name: Jenny Cooper Date of Encounter: 06/29/2021  [facility] Cardiologist: Ida Rogue, MD   Subjective   Feeling back to baseline with resolution of shortness of breath.  No chest pain or edema reported.  Minimal cough.  Inpatient Medications    Scheduled Meds:  apixaban  5 mg Oral BID   azithromycin  250 mg Oral Daily   budesonide  0.5 mg Nebulization Daily   furosemide  20 mg Oral BID   levothyroxine  88 mcg Oral Q0600   metoprolol succinate  12.5 mg Oral Daily   midodrine  5 mg Oral TID WC   polyethylene glycol  17 g Oral Daily   potassium chloride  20 mEq Oral Daily   potassium chloride  20 mEq Oral Daily   revefenacin  175 mcg Nebulization Daily   rosuvastatin  5 mg Oral QHS   sodium phosphate  1 enema Rectal Once   vitamin B-12  1,000 mcg Oral Daily   Continuous Infusions:  sodium chloride Stopped (06/23/21 2243)   PRN Meds: sodium chloride, acetaminophen, fluticasone, ipratropium-albuterol, ondansetron **OR** ondansetron (ZOFRAN) IV   Vital Signs    Vitals:   06/28/21 1958 06/28/21 2358 06/29/21 0452 06/29/21 0725  BP: 129/61 122/74 117/80 117/77  Pulse:  (!) 106 99 80  Resp: 18 17 17 18   Temp: 98.3 F (36.8 C) 98.2 F (36.8 C) 98.5 F (36.9 C) 97.9 F (36.6 C)  TempSrc: Oral   Oral  SpO2: 95% 97% 95% 95%  Weight:        Intake/Output Summary (Last 24 hours) at 06/29/2021 1109 Last data filed at 06/29/2021 0950 Gross per 24 hour  Intake 240 ml  Output 1350 ml  Net -1110 ml   Last 3 Weights 06/28/2021 06/26/2021 06/23/2021  Weight (lbs) 150 lb 5.7 oz 150 lb 6.4 oz 148 lb  Weight (kg) 68.2 kg 68.221 kg 67.132 kg      Telemetry    Atrial fibrillation and intermittent atrial flutter, ventricular rates 70-105 bpm - Personally Reviewed  ECG    No new tracing.  Physical Exam   GEN: No acute distress.   Neck: No JVD Cardiac: Irregularly irregular with 1/6 systolic murmur. Respiratory: Clear to  auscultation bilaterally. GI: Soft, nontender, non-distended  MS: No edema; No deformity. Neuro:  Nonfocal  Psych: Normal affect   Labs    High Sensitivity Troponin:   Recent Labs  Lab 06/23/21 1459 06/23/21 1726 06/24/21 0627  TROPONINIHS 26* 29* 26*     Chemistry Recent Labs  Lab 06/23/21 1459 06/24/21 0011 06/24/21 6073 06/25/21 0537 06/27/21 0552 06/28/21 0458 06/29/21 0504  NA 137   < > 136 137 134*  --   --   K 2.8*   < > 3.7 4.4 4.1 4.2  --   CL 100   < > 100 102 98  --   --   CO2 26   < > 29 31 29   --   --   GLUCOSE 124*   < > 94 102* 99  --   --   BUN 17   < > 15 12 14   --   --   CREATININE 1.16*   < > 1.11* 1.01* 1.04* 1.10* 0.99  CALCIUM 8.8*   < > 8.7* 8.7* 9.0  --   --   MG 1.7  --  1.9  --   --   --   --   PROT 7.7  --   --   --   --   --   --  ALBUMIN 3.4*  --   --   --   --   --   --   AST 22  --   --   --   --   --   --   ALT 8  --   --   --   --   --   --   ALKPHOS 82  --   --   --   --   --   --   BILITOT 1.5*  --   --   --   --   --   --   GFRNONAA 47*   < > 49* 55* 53* 50* 57*  ANIONGAP 11   < > 7 4* 7  --   --    < > = values in this interval not displayed.    Lipids No results for input(s): CHOL, TRIG, HDL, LABVLDL, LDLCALC, CHOLHDL in the last 168 hours.  Hematology Recent Labs  Lab 06/24/21 0627 06/26/21 0647 06/27/21 0552  WBC 5.2 6.0 5.0  RBC 3.70* 3.71* 3.77*  HGB 11.0* 11.3* 11.3*  HCT 35.6* 35.0* 35.7*  MCV 96.2 94.3 94.7  MCH 29.7 30.5 30.0  MCHC 30.9 32.3 31.7  RDW 17.9* 17.6* 17.5*  PLT 215 214 216   Thyroid No results for input(s): TSH, FREET4 in the last 168 hours.  BNP Recent Labs  Lab 06/23/21 1103 06/23/21 1459 06/26/21 0919  BNP 2,164.2* 2,537.6* 644.6*    DDimer No results for input(s): DDIMER in the last 168 hours.   Radiology    CT CHEST W CONTRAST  Result Date: 06/28/2021 CLINICAL DATA:  85 year old female with cough and follow-up of RIGHT lung cancer. EXAM: CT CHEST WITH CONTRAST TECHNIQUE:  Multidetector CT imaging of the chest was performed during intravenous contrast administration. CONTRAST:  18mL OMNIPAQUE IOHEXOL 300 MG/ML  SOLN COMPARISON:  02/18/2020 chest CT and prior studies FINDINGS: Cardiovascular: Cardiomegaly, pacemaker and mitral valve replacement again noted. Coronary artery and aortic atherosclerotic calcifications are again identified. There is no evidence of thoracic aortic aneurysm or pericardial effusion. No pulmonary emboli are identified. Mediastinum/Nodes: RIGHT hilar/perihilar soft tissue has slightly decreased since 02/18/2020. [place] mediastinal lymph nodes are unchanged. No new or enlarging lymph nodes are identified. The visualized thyroid gland, trachea and visualized esophagus are unremarkable. Lungs/Pleura: New peripheral focal opacities/consolidation within the RIGHT UPPER lung and posterior RIGHT mid lung noted-nonspecific and may represent infection or new scarring/atelectasis. Progression of malignancy is considered somewhat less likely. RIGHT perihilar opacities are stable to slightly decreased from the prior study. Interstitial prominence within the RIGHT lung is unchanged. No new suspicious pulmonary abnormalities are noted. LEFT LOWER lobe subsegmental atelectasis/scarring is again identified. There is no evidence of pleural effusion or pneumothorax. Upper Abdomen: No acute abnormality identified. Findings suggestive of hepatic cirrhosis noted. Musculoskeletal: No acute or suspicious bony abnormalities are noted. Compression fracture/vertebral augmentation within the LOWER thoracic spine again noted. IMPRESSION: 1. New nonspecific peripheral focal opacities/consolidation within the RIGHT UPPER lung and posterior RIGHT mid lung - favor infection or scarring/atelectasis over malignancy. Attention to has areas on future scans recommended. 2. Slightly decreased RIGHT hilar/perihilar soft tissue and RIGHT perihilar opacities. 3. Cardiomegaly and coronary artery  disease. 4. Probable cirrhosis. Electronically Signed   By: Margarette Canada M.D.   On: 06/28/2021 12:32    Cardiac Studies   TTE (06/24/2021):  1. Left ventricular ejection fraction, by estimation, is 40 to 45%. The  left ventricle has mild to moderately decreased function.  The left  ventricle demonstrates global hypokinesis. Left ventricular diastolic  parameters are indeterminate.   2. Right ventricular systolic function is low normal. The right  ventricular size is normal. There is moderately elevated pulmonary artery  systolic pressure.   3. The mitral valve is degenerative. Trivial mitral valve regurgitation.  Moderate mitral annular calcification.   4. The aortic valve was not well visualized. Aortic valve regurgitation  is not visualized.   5. The inferior vena cava is dilated in size with <50% respiratory  variability, suggesting right atrial pressure of 15 mmHg.   Patient Profile     85 y.o. female with COPD on home O2, stage III squamous cell lung carcinoma, prior tobacco abuse, hypertension, PAF/flutter, CAD status post CABG, pacemaker, and chronic diastolic heart failure admitted with bronchitis and acute on chronic diastolic heart failure.  Assessment & Plan    Acute on chronic HFpEF with new cardiomyopathy, bronchitis, and acute on chronic respiratory failure: Breathing back to baseline.  Patient appears euvolemic.  Decline in EF noted this admission. -Continue furosemide 20 mg PO BID.  Will need potassium supplementation with KCl 20 mEq daily and f/u BMP when patient is seen in our office on 11/8. -Continue metoprolol succinate 12.5 mg daily; can hopefully escalate dose as blood pressure allows at follow-up. -Continue to hold Entresto with soft blood pressure. -Wean off midodrine, as BP allows.  Paroxysmal atrial fibrillation/flutter: Tele shows alternating fib and flutter.  Ventricular rates are reasonable. -Continue low dose metoprolol and apixaban.  Coronary artery  disease: No angina reported. -Continue secondary prevention.   For questions or updates, please contact [place] Please consult [email or web address] for contact info under [facility].     Signed, Nelva Bush, MD  06/29/2021, 11:09 AM

## 2021-07-01 ENCOUNTER — Inpatient Hospital Stay: Payer: Medicare Other | Admitting: Internal Medicine

## 2021-07-02 ENCOUNTER — Ambulatory Visit (HOSPITAL_COMMUNITY): Payer: Medicare Other

## 2021-07-02 ENCOUNTER — Telehealth: Payer: Self-pay | Admitting: Internal Medicine

## 2021-07-02 NOTE — Telephone Encounter (Signed)
Jenny Cooper, from [facility], calling stating that the pts heart rate is 112  and respirations at 24 at their visit today and is needing to make note of this for PCP. Please advise.       [phone]

## 2021-07-03 ENCOUNTER — Other Ambulatory Visit (INDEPENDENT_AMBULATORY_CARE_PROVIDER_SITE_OTHER): Payer: Medicare Other | Admitting: Internal Medicine

## 2021-07-03 DIAGNOSIS — M5412 Radiculopathy, cervical region: Secondary | ICD-10-CM

## 2021-07-03 DIAGNOSIS — K766 Portal hypertension: Secondary | ICD-10-CM

## 2021-07-03 DIAGNOSIS — I272 Pulmonary hypertension, unspecified: Secondary | ICD-10-CM

## 2021-07-03 DIAGNOSIS — J9611 Chronic respiratory failure with hypoxia: Secondary | ICD-10-CM

## 2021-07-03 DIAGNOSIS — I700 Atherosclerosis of aorta: Secondary | ICD-10-CM

## 2021-07-03 DIAGNOSIS — I471 Supraventricular tachycardia, unspecified: Secondary | ICD-10-CM

## 2021-07-03 DIAGNOSIS — D509 Iron deficiency anemia, unspecified: Secondary | ICD-10-CM

## 2021-07-03 DIAGNOSIS — J449 Chronic obstructive pulmonary disease, unspecified: Secondary | ICD-10-CM

## 2021-07-03 DIAGNOSIS — K5900 Constipation, unspecified: Secondary | ICD-10-CM

## 2021-07-03 DIAGNOSIS — I110 Hypertensive heart disease with heart failure: Secondary | ICD-10-CM

## 2021-07-03 DIAGNOSIS — E039 Hypothyroidism, unspecified: Secondary | ICD-10-CM

## 2021-07-03 DIAGNOSIS — M1990 Unspecified osteoarthritis, unspecified site: Secondary | ICD-10-CM

## 2021-07-03 DIAGNOSIS — I480 Paroxysmal atrial fibrillation: Secondary | ICD-10-CM

## 2021-07-03 DIAGNOSIS — K219 Gastro-esophageal reflux disease without esophagitis: Secondary | ICD-10-CM

## 2021-07-03 DIAGNOSIS — I5042 Chronic combined systolic (congestive) and diastolic (congestive) heart failure: Secondary | ICD-10-CM

## 2021-07-03 DIAGNOSIS — I484 Atypical atrial flutter: Secondary | ICD-10-CM

## 2021-07-03 DIAGNOSIS — J4489 Other specified chronic obstructive pulmonary disease: Secondary | ICD-10-CM

## 2021-07-03 DIAGNOSIS — E876 Hypokalemia: Secondary | ICD-10-CM

## 2021-07-03 DIAGNOSIS — E782 Mixed hyperlipidemia: Secondary | ICD-10-CM

## 2021-07-03 DIAGNOSIS — M80021D Age-related osteoporosis with current pathological fracture, right humerus, subsequent encounter for fracture with routine healing: Secondary | ICD-10-CM

## 2021-07-03 NOTE — Progress Notes (Signed)
Received orders from [facility]. Resumption of care 07/01/21.   Orders from 07/01/21  are reviewed, signed and faxed.

## 2021-07-07 ENCOUNTER — Encounter: Payer: Self-pay | Admitting: Medical

## 2021-07-07 ENCOUNTER — Other Ambulatory Visit: Payer: Self-pay

## 2021-07-07 ENCOUNTER — Ambulatory Visit (INDEPENDENT_AMBULATORY_CARE_PROVIDER_SITE_OTHER): Payer: Medicare Other | Admitting: Medical

## 2021-07-07 VITALS — BP 130/68 | HR 114 | Ht 66.0 in | Wt 143.0 lb

## 2021-07-07 DIAGNOSIS — I480 Paroxysmal atrial fibrillation: Secondary | ICD-10-CM | POA: Diagnosis not present

## 2021-07-07 DIAGNOSIS — I5032 Chronic diastolic (congestive) heart failure: Secondary | ICD-10-CM

## 2021-07-07 DIAGNOSIS — I25709 Atherosclerosis of coronary artery bypass graft(s), unspecified, with unspecified angina pectoris: Secondary | ICD-10-CM

## 2021-07-07 MED ORDER — METOPROLOL SUCCINATE ER 50 MG PO TB24: 50.0000 mg | ORAL_TABLET | Freq: Every day | ORAL | 6 refills | Status: DC

## 2021-07-07 NOTE — Patient Instructions (Signed)
Medication Instructions:  - Your physician has recommended you make the following change in your medication:   1) INCREASE Toprol (metoprolol succinate) to 50 mg: - take 1 tablet by mouth once daily   *If you need a refill on your cardiac medications before your next appointment, please call your pharmacy*   Lab Work: - Your physician recommends that you have lab work today: BMP  If you have labs (blood work) drawn today and your tests are completely normal, you will receive your results only by: [place] (if you have MyChart) OR A paper copy in the mail If you have any lab test that is abnormal or we need to change your treatment, we will call you to review the results.   Testing/Procedures: - none ordered   Follow-Up: At [facility], you and your health needs are our priority.  As part of our continuing mission to provide you with exceptional heart care, we have created designated Provider Care Teams.  These Care Teams include your primary Cardiologist (physician) and Advanced Practice Providers (APPs -  Physician Assistants and Nurse Practitioners) who all work together to provide you with the care you need, when you need it.  We recommend signing up for the patient portal called "MyChart".  Sign up information is provided on this After Visit Summary.  MyChart is used to connect with patients for Virtual Visits (Telemedicine).  Patients are able to view lab/test results, encounter notes, upcoming appointments, etc.  Non-urgent messages can be sent to your provider as well.   To learn more about what you can do with MyChart, go to [email or web address].    Your next appointment:   3-4 week(s)  The format for your next appointment:   In Person  Provider:   You may see Ida Rogue, MD or one of the following Advanced Practice Providers on your designated Care Team:   Murray Hodgkins, NP Christell Faith, PA-C Cadence Kathlen Mody, [place]    Other Instructions  - Please  call to schedule with your Electrophysiologist at [place] (1-2 weeks), if they are unable to see you in that time frame, please call our office so we may see you sooner than 3-4 weeks

## 2021-07-07 NOTE — Progress Notes (Signed)
Cardiology Office Note:    Date:  07/07/2021   ID:  Jenny Cooper, DOB [birth year], MRN [id number]  PCP:  Glean Hess, MD  [facility] Cardiologist:  Ida Rogue, MD  [place] Electrophysiologist:  None   Referring MD: Glean Hess, MD   Chief Complaint: 2 week hospital follow-up  History of Present Illness:    Jenny Cooper is a 85 y.o. female with a hx of COPD on home oxygen 2L, former tobacco use, stage III squamous cell lung carcinoma s/p chemoradiation therapy 6222, diastolic heart failure, hypertension, paroxysmal atrial fibrillation/flutter on chronic anticoagulation and amiodarone therapy, CAD s/p CABG, PPM, who is being seen 06/24/2021 for hospital follow-up.    Recently patient underwent right shoulder replacement. Prior to surgery she was told potassium was low and was given tablets to take. She was seen in the cardiology office 06/23/21 for weakness and fatigue. Labs were checked, which showed K of 2.6 with AKI and she was sent to the ER. She was admitted 10/25-10/31 and treated for CHF with IV lasix. She was started on midodrine for hypotension. Toprol was decreased for hypotension. Entresto held for hypotension. Echo showed LVEF 40-45%, global HL, moderately elevated pulmonary artery pressure.  Today, the patient reports she has been doing much better since discharge. Breathing is much better. Not as much phlegm. No chest pain. No LLE, orthopnea, pnd, palpitations. BP today is good. She is on midodrine 30m TID. EKG shows aflutter with heart rate of 114bpm. Based off report heart rate was over 100 since beginning of November.   Past Medical History:  Diagnosis Date   Acute midline low back pain without sciatica 08/13/2016   Acute on chronic respiratory failure with hypoxia ([facility] 08/21/2018   Acute respiratory failure with hypoxia ([facility] 12/11/2017   Allergy    Aortic atherosclerosis ([facility]    Atypical atrial flutter ([facility]    a.) s/p ablation  07/27/2013 and 05/10/2016   Balance problem    CAD (coronary artery disease)    a. s/p MI x 2 in 2002 s/p PCI x 2 in 2002; b. s/p 2v CABG 2002; c. stress echo 07/2004 w/ evi of pos & inf infarct & no evi of ischemia; d. 4/08 dipyridamole scan w/ multiple areas of infarct, no ischemia, EF 49%; e. cath 04/28/15 3v CAD, med Rx rec, no targets for revasc, LM lum irregs, pLAD 30%, 100%, ost-pLCx 60%, [name] 99%, OM2 100%, p-mRCA 90%, [name] 100% L-R collats, VG-mLAD irregs, VG-OM2 oc   Chronic anticoagulation    a.) Apixaban   Chronic systolic CHF (congestive heart failure) ([facility]    a. echo 03/2015: EF 30-35%, sev ant/inf/pos HK, in mild to mod MR   Complication of anesthesia    a.) postoperative apnea. b.) postoperative hypoxia.   Compressed spine fracture ([facility] 08/06/2016   a.) L2, T11, T12   COPD (chronic obstructive pulmonary disease) (HCC)    Deep vein thrombosis (DVT) of left lower extremity ([facility] 12/2001   Fracture of multiple pubic rami, right, closed, initial encounter ([facility] 05/27/2016   a.) RIGHT superior and inferior pubic rami fractures s/p mechanical fall.   GERD (gastroesophageal reflux disease)    HLD (hyperlipidemia)    HTN (hypertension)    Hypothyroidism    Ischemic cardiomyopathy    Mitral regurgitation    a. s/p mitral ring placement 09/2000; b. echo 09/2010: EF 50%, inf HK, post AK, mild MR, prosthetic mitral valve ring w/ peak gradient of 10 mmHg; b. echo 2/13:  EF 50%, mild MR/TR      Myocardial infarction ([place]) 2002   a.) x 2 in 2002; PCI with stents (unknown type) placed to the p-mRCA, [name], and [name].   Neuropathy    Osteoarthritis    Osteoporosis    Oxygen dependent    a.) 3 L/[place]   Pacemaker    a. MDT 2002; b. generator replacement 2013; c. followed by Dr. Omelia Blackwater, MD   PAF (paroxysmal atrial fibrillation) [facility])    a.) s/p DCCV on 04/08/2017. b.) on apixaban   Personal history of chemotherapy    Personal history of radiation therapy    Pneumonia    S/P CABG x 2  2002   a.) SVG-LAD, SVG-OM   Squamous cell carcinoma of right lung ([place]) 01/03/2015   a.) stage IIIa. b.) s/p concurrent chemotherapy (carboplatin + paclitaxel) and XRT (IMRT 6000 cGy)   SVT (supraventricular tachycardia) (HCC)    Type II diabetes mellitus with complication ([place]) 51/76/1607    Past Surgical History:  Procedure Laterality Date   APPENDECTOMY     CARDIAC CATHETERIZATION N/A 04/28/2015   Procedure: Left Heart Cath and Coronary Angiography;  Surgeon: Wellington Hampshire, MD;  Location: [place] [facility];  Service: Cardiovascular;  Laterality: N/A;   CARDIAC ELECTROPHYSIOLOGY STUDY AND ABLATION N/A 05/10/2016   Procedure(s): INTRACARDIAC ELECTROPHYSIOLOGIC 3D MAPPING, STIMULATION PACING HEART, ICAR CATHETER ABLATION ARRHYTHMIA ADD ON, ABLATE L/R ATRIAL FIBRIL W/ISOLATED PULM VEIN, INTRACARD ECHO, THER/DX INTERVENT; Location: [facility]; Surgeon: Norm Salt, MD   CARDIOVERSION N/A 04/08/2017   Procedure: CARDIOVERSION;  Surgeon: Wellington Hampshire, MD;  Location: [facility];  Service: Cardiovascular;  Laterality: N/A;   CHEST TUBE INSERTION N/A 07/11/2018   Procedure: PXTGGY CATH INSERTION;  Surgeon: [name], MD;  Location: [facility];  Service: Thoracic;  Laterality: N/A;   COLONOSCOPY  12/2009   2 small tubular adenomas   CORONARY ANGIOPLASTY WITH STENT PLACEMENT Left 2002   s/p MI x 2 with subsequent PCI x 2 ; stents (unknown type) placed to the p-mRCA, [name], and [name].   CORONARY ARTERY BYPASS GRAFT  09/2000   DRAIN REMOVAL Right 08/10/2018   Procedure: DRAIN REMOVAL;  Surgeon: [name], MD;  Location: [facility];  Service: General;  Laterality: Right;   ECTOPIC PREGNANCY SURGERY     ELECTROMAGNETIC NAVIGATION BROCHOSCOPY N/A 10/06/2015   Procedure: ELECTROMAGNETIC NAVIGATION BRONCHOSCOPY;  Surgeon: Flora Lipps, MD;  Location: [facility];  Service: Cardiopulmonary;  Laterality: N/A;   ENDOBRONCHIAL ULTRASOUND N/A 10/06/2015   Procedure: ENDOBRONCHIAL ULTRASOUND;  Surgeon:  Flora Lipps, MD;  Location: [facility];  Service: Cardiopulmonary;  Laterality: N/A;   EYE SURGERY     FLEXIBLE BRONCHOSCOPY Right 07/11/2018   Procedure: FLEXIBLE BRONCHOSCOPY;  Surgeon: [name], MD;  Location: [facility];  Service: Thoracic;  Laterality: Right;   KYPHOPLASTY N/A 09/28/2016   Procedure: KYPHOPLASTY;  Surgeon: Hessie Knows, MD;  Location: [facility];  Service: Orthopedics;  Laterality: N/A;   KYPHOPLASTY N/A 02/20/2018   Procedure: [id number];  Surgeon: Hessie Knows, MD;  Location: [facility];  Service: Orthopedics;  Laterality: N/A;   PACEMAKER INSERTION  03/2012   REVERSE SHOULDER ARTHROPLASTY Right 06/02/2021   Procedure: REVERSE SHOULDER ARTHROPLASTY;  Surgeon: Corky Mull, MD;  Location: [facility];  Service: Orthopedics;  Laterality: Right;   SVT ABLATION N/A 07/27/2013   Procedure: SVT ABLATION; Location: [facility]; Surgeon: Rolla Etienne, MD   thorocentesis  12/24/2016   VAGINAL HYSTERECTOMY     partial - left ovary remains  VIDEO ASSISTED THORACOSCOPY Right 07/11/2018   Procedure: VIDEO ASSISTED THORACOSCOPY;  Surgeon: [name], MD;  Location: [facility];  Service: Thoracic;  Laterality: Right;   VIDEO ASSISTED THORACOSCOPY (VATS) W/TALC PLEUADESIS Right 07/11/2018   Procedure: THORACOTOMY PLEURAL BIOPSY WITH TALC PLEURODESIS;  Surgeon: [name], MD;  Location: [facility];  Service: Thoracic;  Laterality: Right;  pleural biopsy    Current Medications: Current Meds  Medication Sig   acetaminophen (TYLENOL) 500 MG tablet Take 500-1,000 mg by mouth every 4 (four) hours as needed for mild pain or moderate pain.   apixaban (ELIQUIS) 5 MG TABS tablet Take 1 tablet (5 mg total) by mouth 2 (two) times daily.   budesonide (PULMICORT) 0.5 MG/2ML nebulizer solution INHALE 2 MILLILITERS BY NEBULIZER 2 TIMES DAILY   cetirizine (ZYRTEC) 10 MG tablet TAKE (1) TABLET BY MOUTH EVERY DAY   fluticasone (FLONASE) 50 MCG/ACT nasal spray Place 2 sprays into both nostrils  daily.   furosemide (LASIX) 20 MG tablet Take 1 tablet (20 mg total) by mouth 2 (two) times daily. TAKE 1 TABLET BY MOUTH TWICE DAILY, ALTERNATING WITH 1 EXTRA TABLET AS NEEDED when weight up by 3 lbs   guaiFENesin (MUCINEX) 600 MG 12 hr tablet Take 1 tablet (600 mg total) by mouth 2 (two) times daily as needed.   ipratropium-albuterol (DUONEB) 0.5-2.5 (3) MG/3ML SOLN TAKE 3 MLS BY NEBULIZATION EVERY 6 HOURSAS NEEDED FOR WHEEZING OR SHORTNESS OF BREATH   ketorolac (ACULAR) 0.5 % ophthalmic solution Place 1 drop into the right eye in the morning, at noon, and at bedtime.   levothyroxine (SYNTHROID) 88 MCG tablet TAKE (1) TABLET BY MOUTH EVERY DAY BEFORE BREAKFAST   midodrine (PROAMATINE) 5 MG tablet Take 1 tablet (5 mg total) by mouth 3 (three) times daily with meals.   Multiple Vitamins-Minerals (CENTRUM SILVER PO) Take 1 tablet by mouth daily.   oxyCODONE (OXY IR/ROXICODONE) 5 MG immediate release tablet Take 1 tablet (5 mg total) by mouth every 4 (four) hours as needed for moderate pain.   OXYGEN Inhale 3 L into the lungs.    potassium chloride SA (KLOR-CON) 20 MEQ tablet Take 1 tablet (20 mEq total) by mouth daily.   rosuvastatin (CRESTOR) 5 MG tablet TAKE ONE (1) TABLET BY MOUTH ONCE DAILY (Patient taking differently: Take 5 mg by mouth at bedtime.)   vitamin B-12 (CYANOCOBALAMIN) 1000 MCG tablet Take 1,000 mcg by mouth daily.   Vitamins A & D (VITAMIN A & D) ointment Apply 1 application topically as needed (irritation).   YUPELRI 175 MCG/3ML nebulizer solution Use one vial in nebulizer once daily. Do not mix with other nebulized medications.   [DISCONTINUED] metoprolol succinate (TOPROL-XL) 25 MG 24 hr tablet Take 0.5 tablets (12.5 mg total) by mouth daily.     Allergies:   Cefdinir, Lovenox [enoxaparin sodium], and Meperidine   Social History   Socioeconomic History   Marital status: Widowed    Spouse name: Not on file   Number of children: 6   Years of education: Not on file    Highest education level: Some college, no degree  Occupational History   Occupation: Retired  Tobacco Use   Smoking status: Former    Packs/day: 1.00    Years: 40.00    Pack years: 40.00    Types: Cigarettes    Quit date: 2001    Years since quitting: 21.8   Smokeless tobacco: Never   Tobacco comments:    quit smoking in 08/28/2000. Smoking cessation materials  not required  Vaping Use   Vaping Use: Never used  Substance and Sexual Activity   Alcohol use: Yes    Alcohol/week: 10.0 standard drinks    Types: 10 Glasses of wine per week    Comment: 1 glass of wine per day   Drug use: No   Sexual activity: Never  Other Topics Concern   Not on file  Social History Narrative   Pt's son lives with her   Social Determinants of Health   Financial Resource Strain: Not on file  Food Insecurity: Not on file  Transportation Needs: Not on file  Physical Activity: Not on file  Stress: Not on file  Social Connections: Not on file     Family History: The patient's =family history includes COPD in her brother, mother, and sister; Colon cancer in her brother; Hypertension in her brother and sister; Lung disease in her father; Stroke in her maternal grandmother. There is no history of Heart attack or Breast cancer.  ROS:   Please see the history of present illness.     All other systems reviewed and are negative.  EKGs/Labs/Other Studies Reviewed:    The following studies were reviewed today:  Echo 06/24/21 1. Left ventricular ejection fraction, by estimation, is 40 to 45%. The  left ventricle has mild to moderately decreased function. The left  ventricle demonstrates global hypokinesis. Left ventricular diastolic  parameters are indeterminate.   2. Right ventricular systolic function is low normal. The right  ventricular size is normal. There is moderately elevated pulmonary artery  systolic pressure.   3. The mitral valve is degenerative. Trivial mitral valve regurgitation.   Moderate mitral annular calcification.   4. The aortic valve was not well visualized. Aortic valve regurgitation  is not visualized.   5. The inferior vena cava is dilated in size with <50% respiratory  variability, suggesting right atrial pressure of 15 mmHg.   Echo 02/2020  1. Left ventricular ejection fraction, by estimation, is 50 to 55%. The  left ventricle has low normal function. The left ventricle has no regional  wall motion abnormalities. Left ventricular diastolic parameters are  indeterminate.   2. Right ventricular systolic function is normal. The right ventricular  size is normal. There is moderately elevated pulmonary artery systolic  pressure. The estimated right ventricular systolic pressure is 00.9 mmHg.   3. Mild mitral valve regurgitation.   4. Aortic valve regurgitation is mild.   Cardiac cath 2016 Prox LAD-2 lesion, 100% stenosed. Prox LAD-1 lesion, 30% stenosed. Ost Cx to Prox Cx lesion, 60% stenosed. Mid Cx lesion, 99% stenosed. The lesion was previously treated with a stent (unknown type) greater than two years ago. Ost 2nd Mrg to 2nd Mrg lesion, 100% stenosed. SVG was injected . Origin lesion, 100% stenosed. Prox RCA to Mid RCA lesion, 90% stenosed. The lesion was previously treated with a stent (unknown type) greater than two years ago. Mid RCA to Dist RCA lesion, 100% stenosed. The lesion was previously treated with a stent (unknown type) . SVG was injected . The graft exhibits minimal luminal irregularities. The left ventricular systolic function is normal.   1. Severe underlying three-vessel coronary artery disease with patent SVG to LAD. Occluded SVG likely to OM. The native RCA has severe in-stent restenosis in the midsegment and is occluded distally at the sites of previously placed stents. There are left to right collaterals. 2. Moderately reduced LV systolic function with an ejection fraction of 35-40%. 3. Mildly dilated left ventricular  end-diastolic pressure.  EKG:  EKG is  ordered today.  The ekg ordered today demonstrates Aflutter 2:1, 114bpm  Recent Labs: 06/23/2021: ALT 8 06/24/2021: Magnesium 1.9 06/26/2021: B Natriuretic Peptide 644.6 06/27/2021: BUN 14; Hemoglobin 11.3; Platelets 216; Sodium 134 06/28/2021: Potassium 4.2 06/29/2021: Creatinine, Ser 0.99  Recent Lipid Panel    Component Value Date/Time   CHOL 131 08/25/2017 0937   TRIG 159 (H) 08/25/2017 0937   HDL 36 (L) 08/25/2017 0937   CHOLHDL 3.6 08/25/2017 0937   LDLCALC 63 08/25/2017 0937    Physical Exam:    VS:  BP 130/68 (BP Location: Left Arm, Patient Position: Sitting, Cuff Size: Normal)   Pulse (!) 114   Ht _0  (1.676 m)   Wt 143 lb (64.9 kg)   SpO2 95%   BMI 23.08 kg/m     Wt Readings from Last 3 Encounters:  07/07/21 143 lb (64.9 kg)  06/28/21 150 lb 5.7 oz (68.2 kg)  06/23/21 148 lb (67.1 kg)     GEN:  Well nourished, well developed in no acute distress HEENT: Normal NECK: No JVD; No carotid bruits LYMPHATICS: No lymphadenopathy CARDIAC: Reg Irreg, tachy, no murmurs, rubs, gallops RESPIRATORY:  Clear to auscultation without rales, wheezing or rhonchi  ABDOMEN: Soft, non-tender, non-distended MUSCULOSKELETAL:  No edema; No deformity  SKIN: Warm and dry NEUROLOGIC:  Alert and oriented x 3 PSYCHIATRIC:  Normal affect   ASSESSMENT:    1. PAF (paroxysmal atrial fibrillation) ([place])   2. Coronary artery disease involving coronary bypass graft of native heart with angina pectoris ([place])   3. Chronic diastolic heart failure (HCC)    PLAN:    In order of problems listed above:  Paroxysmal Afib/flutter She is in aflutter with heart rate 114bpm, reported heart rates have been high all November. She is asymptomatic. In review, it seemed she was in and out of afib. She is followed by EP at [facility]. Says she has been on amiodarone in the past, unsure why it was stopped. She is on Toprol 12.591m daily, this was decreased from 554mBID  given hypotension in the hospital. She is on midodrine 91m86mID. I will increase Toprol to 19m21mily for better rate control. Recommend she follow-up with EP [facility] for further management and recommendations for afib. Continue Eliquis for stroke ppx.   HFmrEF Echo showed EF 40-45%. Patient is euvolemic on exam. Continue Lasix 20mg32m and potassium. Increase Toprol as above for rate control. She is on midodrine, limiting GDMT. BMET today to check kidney function and potassium.   CAD s/p CABG Patient denies chest pain. Continue ASA, BB and statin. No further work-up at this time.   Disposition: Follow up 1 month with MD/APP    Signed, Robby Pirani H FurNinfa MeekerC  07/07/2021 3:01 PM    [facility]

## 2021-07-08 LAB — BASIC METABOLIC PANEL
BUN/Creatinine Ratio: 25 (ref 12–28)
BUN: 25 mg/dL (ref 8–27)
CO2: 27 mmol/L (ref 20–29)
Calcium: 9.9 mg/dL (ref 8.7–10.3)
Chloride: 98 mmol/L (ref 96–106)
Creatinine, Ser: 1.02 mg/dL — ABNORMAL HIGH (ref 0.57–1.00)
Glucose: 114 mg/dL — ABNORMAL HIGH (ref 70–99)
Potassium: 4.6 mmol/L (ref 3.5–5.2)
Sodium: 139 mmol/L (ref 134–144)
eGFR: 55 mL/min/{1.73_m2} — ABNORMAL LOW (ref >59)

## 2021-07-09 ENCOUNTER — Ambulatory Visit: Payer: Medicare Other | Admitting: Family

## 2021-07-13 NOTE — Progress Notes (Signed)
Patient ID: Jenny Cooper, female    DOB: June 15, 1936, 85 y.o.   MRN: [id number]  HPI  Jenny Cooper is a 85 y/o female with a history of atrial fibrillation (cardioversion), MI, lung cancer, hypothyroidism, HTN, hyperlipidemia, blood clots, GERD, COPD, spinal compression, previous tobacco use and chronic heart failure.   Echo report from 06/24/21 reviewed and showed an EF of 40-45% and moderately elevated PA pressure of 56.0. Echo done 04/06/17 reviewed and shows an EF of 30-35% along with trivial AR, moderate MR and moderately elevated PA pressure of 50 mm Hg.   Cardiac catheterization done 04/28/15 shows severe underlying three-vessel CAD with occluded SVG likely to OM. No good options for revascularization. Native RCA is occluded with left-to-right collaterals. Recommended optimizing medical therapy.   Admitted 06/23/21 due to shortness of breath and pedal edema. Hypokalemia corrected. Initially given IV lasix with transition to oral diuretics. Cardiology consult obtained. Entresto held due to hypotension. Acute bronchitis treated with zpack. Elevated troponin thought to be due to demand ischemia. Discharged after 6 days.   Jenny Cooper presents today for a follow-up visit although hasn't been seen since 2019. Jenny Cooper presents with a chief complaint of minimal shortness of breath upon moderate exertion. Jenny Cooper describes this as chronic in nature having been present for several years. Jenny Cooper has associated fatigue & back pain along with this. Jenny Cooper denies any dizziness, abdominal distention, palpitations, pedal edema, chest pain, cough or weight gain.   Has had a fall and broke Jenny Cooper right arm, currently has arm in a sling and is participating in physical therapy. Jenny Cooper says that Jenny Cooper's developed worsening back pain due to the broken arm.   Past Medical History:  Diagnosis Date   Acute midline low back pain without sciatica 08/13/2016   Acute on chronic respiratory failure with hypoxia ([place]) 08/21/2018   Acute respiratory  failure with hypoxia ([place]) 12/11/2017   Allergy    Aortic atherosclerosis ([place])    Atypical atrial flutter ([place])    a.) s/p ablation 07/27/2013 and 05/10/2016   Balance problem    CAD (coronary artery disease)    a. s/p MI x 2 in 2002 s/p PCI x 2 in 2002; b. s/p 2v CABG 2002; c. stress echo 07/2004 w/ evi of pos & inf infarct & no evi of ischemia; d. 4/08 dipyridamole scan w/ multiple areas of infarct, no ischemia, EF 49%; e. cath 04/28/15 3v CAD, med Rx rec, no targets for revasc, LM lum irregs, pLAD 30%, 100%, ost-pLCx 60%, [name] 99%, OM2 100%, p-mRCA 90%, [name] 100% L-R collats, VG-mLAD irregs, VG-OM2 oc   Chronic anticoagulation    a.) Apixaban   Chronic systolic CHF (congestive heart failure) ([place])    a. echo 03/2015: EF 30-35%, sev ant/inf/pos HK, in mild to mod MR   Complication of anesthesia    a.) postoperative apnea. b.) postoperative hypoxia.   Compressed spine fracture ([place]) 08/06/2016   a.) L2, T11, T12   COPD (chronic obstructive pulmonary disease) (HCC)    Deep vein thrombosis (DVT) of left lower extremity ([place]) 12/2001   Fracture of multiple pubic rami, right, closed, initial encounter ([place]) 05/27/2016   a.) RIGHT superior and inferior pubic rami fractures s/p mechanical fall.   GERD (gastroesophageal reflux disease)    HLD (hyperlipidemia)    HTN (hypertension)    Hypothyroidism    Ischemic cardiomyopathy    Mitral regurgitation    a. s/p mitral ring placement 09/2000; b. echo 09/2010: EF 50%, inf HK, post AK, mild  MR, prosthetic mitral valve ring w/ peak gradient of 10 mmHg; b. echo 2/13: EF 50%, mild MR/TR      Myocardial infarction ([place]) 2002   a.) x 2 in 2002; PCI with stents (unknown type) placed to the p-mRCA, [name], and [name].   Neuropathy    Osteoarthritis    Osteoporosis    Oxygen dependent    a.) 3 L/[place]   Pacemaker    a. MDT 2002; b. generator replacement 2013; c. followed by Dr. Omelia Blackwater, MD   PAF (paroxysmal atrial fibrillation) [facility])    a.) s/p DCCV on  04/08/2017. b.) on apixaban   Personal history of chemotherapy    Personal history of radiation therapy    Pneumonia    S/P CABG x 2 2002   a.) SVG-LAD, SVG-OM   Squamous cell carcinoma of right lung ([place]) 01/03/2015   a.) stage IIIa. b.) s/p concurrent chemotherapy (carboplatin + paclitaxel) and XRT (IMRT 6000 cGy)   SVT (supraventricular tachycardia) (HCC)    Type II diabetes mellitus with complication ([place]) 97/09/6376   Past Surgical History:  Procedure Laterality Date   APPENDECTOMY     CARDIAC CATHETERIZATION N/A 04/28/2015   Procedure: Left Heart Cath and Coronary Angiography;  Surgeon: Wellington Hampshire, MD;  Location: [place] [facility];  Service: Cardiovascular;  Laterality: N/A;   CARDIAC ELECTROPHYSIOLOGY STUDY AND ABLATION N/A 05/10/2016   Procedure(s): INTRACARDIAC ELECTROPHYSIOLOGIC 3D MAPPING, STIMULATION PACING HEART, ICAR CATHETER ABLATION ARRHYTHMIA ADD ON, ABLATE L/R ATRIAL FIBRIL W/ISOLATED PULM VEIN, INTRACARD ECHO, THER/DX INTERVENT; Location: [facility]; Surgeon: Norm Salt, MD   CARDIOVERSION N/A 04/08/2017   Procedure: CARDIOVERSION;  Surgeon: Wellington Hampshire, MD;  Location: [facility];  Service: Cardiovascular;  Laterality: N/A;   CHEST TUBE INSERTION N/A 07/11/2018   Procedure: HYIFOY CATH INSERTION;  Surgeon: [name], MD;  Location: [facility];  Service: Thoracic;  Laterality: N/A;   COLONOSCOPY  12/2009   2 small tubular adenomas   CORONARY ANGIOPLASTY WITH STENT PLACEMENT Left 2002   s/p MI x 2 with subsequent PCI x 2 ; stents (unknown type) placed to the p-mRCA, [name], and [name].   CORONARY ARTERY BYPASS GRAFT  09/2000   DRAIN REMOVAL Right 08/10/2018   Procedure: DRAIN REMOVAL;  Surgeon: [name], MD;  Location: [facility];  Service: General;  Laterality: Right;   ECTOPIC PREGNANCY SURGERY     ELECTROMAGNETIC NAVIGATION BROCHOSCOPY N/A 10/06/2015   Procedure: ELECTROMAGNETIC NAVIGATION BRONCHOSCOPY;  Surgeon: Flora Lipps, MD;  Location: [facility];   Service: Cardiopulmonary;  Laterality: N/A;   ENDOBRONCHIAL ULTRASOUND N/A 10/06/2015   Procedure: ENDOBRONCHIAL ULTRASOUND;  Surgeon: Flora Lipps, MD;  Location: [facility];  Service: Cardiopulmonary;  Laterality: N/A;   EYE SURGERY     FLEXIBLE BRONCHOSCOPY Right 07/11/2018   Procedure: FLEXIBLE BRONCHOSCOPY;  Surgeon: [name], MD;  Location: [facility];  Service: Thoracic;  Laterality: Right;   KYPHOPLASTY N/A 09/28/2016   Procedure: KYPHOPLASTY;  Surgeon: Hessie Knows, MD;  Location: [facility];  Service: Orthopedics;  Laterality: N/A;   KYPHOPLASTY N/A 02/20/2018   Procedure: DXAJOINOMVE-H2;  Surgeon: Hessie Knows, MD;  Location: [facility];  Service: Orthopedics;  Laterality: N/A;   PACEMAKER INSERTION  03/2012   REVERSE SHOULDER ARTHROPLASTY Right 06/02/2021   Procedure: REVERSE SHOULDER ARTHROPLASTY;  Surgeon: Corky Mull, MD;  Location: [facility];  Service: Orthopedics;  Laterality: Right;   SVT ABLATION N/A 07/27/2013   Procedure: SVT ABLATION; Location: [facility]; Surgeon: Rolla Etienne, MD   thorocentesis  12/24/2016  VAGINAL HYSTERECTOMY     partial - left ovary remains   VIDEO ASSISTED THORACOSCOPY Right 07/11/2018   Procedure: VIDEO ASSISTED THORACOSCOPY;  Surgeon: [name], MD;  Location: [facility];  Service: Thoracic;  Laterality: Right;   VIDEO ASSISTED THORACOSCOPY (VATS) W/TALC PLEUADESIS Right 07/11/2018   Procedure: THORACOTOMY PLEURAL BIOPSY WITH TALC PLEURODESIS;  Surgeon: [name], MD;  Location: [facility];  Service: Thoracic;  Laterality: Right;  pleural biopsy   Family History  Problem Relation Age of Onset   COPD Mother        sister, and brother   Lung disease Father    Stroke Maternal Grandmother    Hypertension Sister    COPD Sister    Hypertension Brother    COPD Brother    Colon cancer Brother    Heart attack Neg Hx    Breast cancer Neg Hx    Social History   Tobacco Use   Smoking status: Former    Packs/day: 1.00    Years: 40.00     Pack years: 40.00    Types: Cigarettes    Quit date: 2001    Years since quitting: 21.8   Smokeless tobacco: Never   Tobacco comments:    quit smoking in 08/28/2000. Smoking cessation materials not required  Substance Use Topics   Alcohol use: Yes    Alcohol/week: 10.0 standard drinks    Types: 10 Glasses of wine per week    Comment: 1 glass of wine per day   Allergies  Allergen Reactions   Cefdinir Rash    Had hives after completing treatment - unsure if it was the cause.  Has received 1st generation cephalosporin many times (04/24/2012, 07/27/2013, 09/28/2016, 02/20/2018, 07/11/2018, 08/10/2018 without documented ADRs.   Lovenox [Enoxaparin Sodium] Itching   Meperidine Other (See Comments)    Other Reaction: pt does not like how it makes Jenny Cooper feel Other reaction(s): Other (See Comments) Other Reaction: pt does not like how it makes Jenny Cooper feel   Prior to Admission medications   Medication Sig Start Date End Date Taking? Authorizing Provider  acetaminophen (TYLENOL) 500 MG tablet Take 500-1,000 mg by mouth every 4 (four) hours as needed for mild pain or moderate pain.   Yes [provider]  apixaban (ELIQUIS) 5 MG TABS tablet Take 1 tablet (5 mg total) by mouth 2 (two) times daily. 06/29/21 07/29/21 Yes Amery, Gwynneth Albright, MD  budesonide (PULMICORT) 0.5 MG/2ML nebulizer solution INHALE 2 MILLILITERS BY NEBULIZER 2 TIMES DAILY 06/27/20  Yes Flora Lipps, MD  cetirizine (ZYRTEC) 10 MG tablet TAKE (1) TABLET BY MOUTH EVERY DAY 06/15/18  Yes Glean Hess, MD  fluticasone [facility]) 50 MCG/ACT nasal spray Place 2 sprays into both nostrils daily. 01/23/20  Yes Glean Hess, MD  furosemide (LASIX) 20 MG tablet Take 1 tablet (20 mg total) by mouth 2 (two) times daily. TAKE 1 TABLET BY MOUTH TWICE DAILY, ALTERNATING WITH 1 EXTRA TABLET AS NEEDED when weight up by 3 lbs 06/29/21  Yes Nolberto Hanlon, MD  guaiFENesin (MUCINEX) 600 MG 12 hr tablet Take 1 tablet (600 mg total) by mouth 2  (two) times daily as needed. 02/11/20  Yes Martyn Ehrich, NP  ipratropium-albuterol (DUONEB) 0.5-2.5 (3) MG/3ML SOLN TAKE 3 MLS BY NEBULIZATION EVERY 6 HOURSAS NEEDED FOR WHEEZING OR SHORTNESS OF BREATH 06/18/21  Yes Flora Lipps, MD  ketorolac (ACULAR) 0.5 % ophthalmic solution Place 1 drop into the right eye in the morning, at noon, and at bedtime. 05/05/21  Yes [provider]  levothyroxine (SYNTHROID) 88 MCG tablet TAKE (1) TABLET BY MOUTH EVERY DAY BEFORE BREAKFAST 01/08/21  Yes Glean Hess, MD  metoprolol succinate (TOPROL-XL) 50 MG 24 hr tablet Take 1 tablet (50 mg total) by mouth daily. Take with or immediately following a meal. 07/07/21 10/05/21 Yes Furth, Cadence H, PA-C  midodrine (PROAMATINE) 5 MG tablet Take 1 tablet (5 mg total) by mouth 3 (three) times daily with meals. 06/29/21 07/29/21 Yes Nolberto Hanlon, MD  Multiple Vitamins-Minerals (CENTRUM SILVER PO) Take 1 tablet by mouth daily.   Yes [provider]  OXYGEN Inhale 3 L into the lungs.    Yes [provider]  potassium chloride SA (KLOR-CON) 20 MEQ tablet Take 1 tablet (20 mEq total) by mouth daily. 06/30/21 07/30/21 Yes Amery, Gwynneth Albright, MD  rosuvastatin (CRESTOR) 5 MG tablet TAKE ONE (1) TABLET BY MOUTH ONCE DAILY Patient taking differently: Take 5 mg by mouth at bedtime. 03/30/21  Yes Gollan, Kathlene November, MD  vitamin B-12 (CYANOCOBALAMIN) 1000 MCG tablet Take 1,000 mcg by mouth daily.   Yes [provider]  Vitamins A & D (VITAMIN A & D) ointment Apply 1 application topically as needed (irritation).   Yes [provider]  YUPELRI 175 MCG/3ML nebulizer solution Use one vial in nebulizer once daily. Do not mix with other nebulized medications. 04/23/21  Yes Flora Lipps, MD   Review of Systems  Constitutional:  Positive for fatigue. Negative for appetite change.  HENT:  Negative for congestion, rhinorrhea and sore throat.   Eyes: Negative.   Respiratory:  Positive for shortness of breath.  Negative for cough and chest tightness.   Cardiovascular:  Negative for chest pain, palpitations and leg swelling.  Gastrointestinal:  Negative for abdominal distention and abdominal pain.  Endocrine: Negative.   Genitourinary: Negative.   Musculoskeletal:  Positive for back pain. Negative for myalgias and neck pain.  Skin: Negative.   Allergic/Immunologic: Negative.   Neurological:  Negative for dizziness and light-headedness.  Hematological:  Negative for adenopathy. Does not bruise/bleed easily.  Psychiatric/Behavioral:  Negative for dysphoric mood and sleep disturbance (sleeping on 1 pillow). The patient is not nervous/anxious.    Vitals:   07/14/21 1100  BP: (!) 144/76  Pulse: 96  Resp: 16  SpO2: 98%  Weight: 140 lb 8 oz (63.7 kg)  Height: _0  (1.676 m)   Wt Readings from Last 3 Encounters:  07/14/21 140 lb 8 oz (63.7 kg)  07/07/21 143 lb (64.9 kg)  06/28/21 150 lb 5.7 oz (68.2 kg)   Lab Results  Component Value Date   CREATININE 1.02 (H) 07/07/2021   CREATININE 0.99 06/29/2021   CREATININE 1.10 (H) 06/28/2021   Physical Exam Vitals and nursing note reviewed.  Constitutional:      Appearance: Jenny Cooper is well-developed.  HENT:     Head: Normocephalic and atraumatic.  Neck:     Vascular: No JVD.  Cardiovascular:     Rate and Rhythm: Normal rate. Rhythm irregular.  Pulmonary:     Effort: Pulmonary effort is normal.     Breath sounds: No wheezing or rales.  Abdominal:     General: There is no distension.     Palpations: Abdomen is soft.     Tenderness: There is no abdominal tenderness.  Musculoskeletal:        General: No tenderness.     Cervical back: Normal range of motion and neck supple.  Skin:    General: Skin is warm and dry.  Neurological:     Mental Status: Jenny Cooper is alert and oriented to person, place, and time.  Psychiatric:        Behavior: Behavior normal.        Thought Content: Thought content normal.   Assessment & Plan:   1: Chronic heart  failure with reduced ejection fraction- - NYHA class II - euvolemic today - weighing daily; reminded to call for an overnight weight gain of >2 pounds or a weekly weight gain of >5 pounds - adding "some" salt but only to eggs; says that Jenny Cooper doesn't eat eggs on a daily basis - on GDMT of metoprolol - has history of hypotension so may not be able to add entresto or spironolactone - consider adding SGLT2 - saw cardiology Kathlen Mody) 07/07/21 - BNP from 06/26/21 was 644.6  2: Atrial fibrillation- - on apixaban - saw EP Glennon Mac) 07/09/21  3: HTN- - BP mildly elevated (144/76) - follows with  PCP Army Melia)  - BMP from 07/07/21 reviewed and showed sodium 139, potassium 4.6, creatinine 1.02 and GFR 55  4: COPD- - saw pulmonology Lanney Gins) 05/29/21 - wearing oxygen at 2L around the clock   Medication list reviewed.   Return in 3 months or sooner for any questions/problems before then.

## 2021-07-14 ENCOUNTER — Ambulatory Visit: Payer: Medicare Other | Attending: Family | Admitting: Family

## 2021-07-14 ENCOUNTER — Encounter: Payer: Self-pay | Admitting: Family

## 2021-07-14 ENCOUNTER — Other Ambulatory Visit: Payer: Self-pay

## 2021-07-14 VITALS — BP 144/76 | HR 96 | Resp 16 | Ht 66.0 in | Wt 140.5 lb

## 2021-07-14 DIAGNOSIS — K219 Gastro-esophageal reflux disease without esophagitis: Secondary | ICD-10-CM | POA: Insufficient documentation

## 2021-07-14 DIAGNOSIS — Z86718 Personal history of other venous thrombosis and embolism: Secondary | ICD-10-CM | POA: Insufficient documentation

## 2021-07-14 DIAGNOSIS — Z87891 Personal history of nicotine dependence: Secondary | ICD-10-CM | POA: Insufficient documentation

## 2021-07-14 DIAGNOSIS — Z9181 History of falling: Secondary | ICD-10-CM | POA: Insufficient documentation

## 2021-07-14 DIAGNOSIS — I5022 Chronic systolic (congestive) heart failure: Secondary | ICD-10-CM | POA: Insufficient documentation

## 2021-07-14 DIAGNOSIS — I110 Hypertensive heart disease with heart failure: Secondary | ICD-10-CM | POA: Insufficient documentation

## 2021-07-14 DIAGNOSIS — I252 Old myocardial infarction: Secondary | ICD-10-CM | POA: Insufficient documentation

## 2021-07-14 DIAGNOSIS — M549 Dorsalgia, unspecified: Secondary | ICD-10-CM | POA: Diagnosis not present

## 2021-07-14 DIAGNOSIS — G9520 Unspecified cord compression: Secondary | ICD-10-CM | POA: Insufficient documentation

## 2021-07-14 DIAGNOSIS — Z79899 Other long term (current) drug therapy: Secondary | ICD-10-CM | POA: Diagnosis not present

## 2021-07-14 DIAGNOSIS — I480 Paroxysmal atrial fibrillation: Secondary | ICD-10-CM | POA: Diagnosis not present

## 2021-07-14 DIAGNOSIS — I959 Hypotension, unspecified: Secondary | ICD-10-CM | POA: Insufficient documentation

## 2021-07-14 DIAGNOSIS — E039 Hypothyroidism, unspecified: Secondary | ICD-10-CM | POA: Insufficient documentation

## 2021-07-14 DIAGNOSIS — Z9981 Dependence on supplemental oxygen: Secondary | ICD-10-CM | POA: Insufficient documentation

## 2021-07-14 DIAGNOSIS — J440 Chronic obstructive pulmonary disease with acute lower respiratory infection: Secondary | ICD-10-CM | POA: Insufficient documentation

## 2021-07-14 DIAGNOSIS — Z7901 Long term (current) use of anticoagulants: Secondary | ICD-10-CM | POA: Insufficient documentation

## 2021-07-14 DIAGNOSIS — J449 Chronic obstructive pulmonary disease, unspecified: Secondary | ICD-10-CM

## 2021-07-14 DIAGNOSIS — Z85118 Personal history of other malignant neoplasm of bronchus and lung: Secondary | ICD-10-CM | POA: Insufficient documentation

## 2021-07-14 DIAGNOSIS — E785 Hyperlipidemia, unspecified: Secondary | ICD-10-CM | POA: Diagnosis not present

## 2021-07-14 DIAGNOSIS — I4891 Unspecified atrial fibrillation: Secondary | ICD-10-CM | POA: Insufficient documentation

## 2021-07-14 DIAGNOSIS — I10 Essential (primary) hypertension: Secondary | ICD-10-CM | POA: Diagnosis not present

## 2021-07-14 NOTE — Patient Instructions (Signed)
Continue weighing daily and call for an overnight weight gain of > 2 pounds or a weekly weight gain of >5 pounds. 

## 2021-07-15 ENCOUNTER — Ambulatory Visit (INDEPENDENT_AMBULATORY_CARE_PROVIDER_SITE_OTHER): Payer: Medicare Other

## 2021-07-15 DIAGNOSIS — Z0000 Encounter for general adult medical examination without abnormal findings: Secondary | ICD-10-CM | POA: Diagnosis not present

## 2021-07-15 NOTE — Patient Instructions (Signed)
Jenny Cooper , Thank you for taking time to come for your Medicare Wellness Visit. I appreciate your ongoing commitment to your health goals. Please review the following plan we discussed and let me know if I can assist you in the future.   Screening recommendations/referrals: Colonoscopy: done 2011; due for repeat screening; please discuss with your primary care provider Mammogram: no longer required Bone Density: no longer required Recommended yearly ophthalmology/optometry visit for glaucoma screening and checkup Recommended yearly dental visit for hygiene and checkup  Vaccinations: Influenza vaccine: due Pneumococcal vaccine: done 04/21/17 Tdap vaccine: due Shingles vaccine: Shingrix discussed. Please contact your pharmacy for coverage information.  Covid-19: done 09/09/19, 10/10/19 & 06/20/20  Advanced directives: Please bring a copy of your health care power of attorney and living will to the office at your convenience.   Conditions/risks identified: Recommend fall prevention in the home  Next appointment: Follow up in one year for your annual wellness visit    Preventive Care 65 Years and Older, Female Preventive care refers to lifestyle choices and visits with your health care provider that can promote health and wellness. What does preventive care include? A yearly physical exam. This is also called an annual well check. Dental exams once or twice a year. Routine eye exams. Ask your health care provider how often you should have your eyes checked. Personal lifestyle choices, including: Daily care of your teeth and gums. Regular physical activity. Eating a healthy diet. Avoiding tobacco and drug use. Limiting alcohol use. Practicing safe sex. Taking low-dose aspirin every day. Taking vitamin and mineral supplements as recommended by your health care provider. What happens during an annual well check? The services and screenings done by your health care provider during your  annual well check will depend on your age, overall health, lifestyle risk factors, and family history of disease. Counseling  Your health care provider may ask you questions about your: Alcohol use. Tobacco use. Drug use. Emotional well-being. Home and relationship well-being. Sexual activity. Eating habits. History of falls. Memory and ability to understand (cognition). Work and work Statistician. Reproductive health. Screening  You may have the following tests or measurements: Height, weight, and BMI. Blood pressure. Lipid and cholesterol levels. These may be checked every 5 years, or more frequently if you are over 41 years old. Skin check. Lung cancer screening. You may have this screening every year starting at age 46 if you have a 30-pack-year history of smoking and currently smoke or have quit within the past 15 years. Fecal occult blood test (FOBT) of the stool. You may have this test every year starting at age 62. Flexible sigmoidoscopy or colonoscopy. You may have a sigmoidoscopy every 5 years or a colonoscopy every 10 years starting at age 48. Hepatitis C blood test. Hepatitis B blood test. Sexually transmitted disease (STD) testing. Diabetes screening. This is done by checking your blood sugar (glucose) after you have not eaten for a while (fasting). You may have this done every 1-3 years. Bone density scan. This is done to screen for osteoporosis. You may have this done starting at age 71. Mammogram. This may be done every 1-2 years. Talk to your health care provider about how often you should have regular mammograms. Talk with your health care provider about your test results, treatment options, and if necessary, the need for more tests. Vaccines  Your health care provider may recommend certain vaccines, such as: Influenza vaccine. This is recommended every year. Tetanus, diphtheria, and acellular pertussis (Tdap, Td)  vaccine. You may need a Td booster every 10  years. Zoster vaccine. You may need this after age 48. Pneumococcal 13-valent conjugate (PCV13) vaccine. One dose is recommended after age 27. Pneumococcal polysaccharide (PPSV23) vaccine. One dose is recommended after age 83. Talk to your health care provider about which screenings and vaccines you need and how often you need them. This information is not intended to replace advice given to you by your health care provider. Make sure you discuss any questions you have with your health care provider. Document Released: 09/12/2015 Document Revised: 05/05/2016 Document Reviewed: 06/17/2015 Elsevier Interactive Patient Education  2017 [place] Prevention in the Home Falls can cause injuries. They can happen to people of all ages. There are many things you can do to make your home safe and to help prevent falls. What can I do on the outside of my home? Regularly fix the edges of walkways and driveways and fix any cracks. Remove anything that might make you trip as you walk through a door, such as a raised step or threshold. Trim any bushes or trees on the path to your home. Use bright outdoor lighting. Clear any walking paths of anything that might make someone trip, such as rocks or tools. Regularly check to see if handrails are loose or broken. Make sure that both sides of any steps have handrails. Any raised decks and porches should have guardrails on the edges. Have any leaves, snow, or ice cleared regularly. Use sand or salt on walking paths during winter. Clean up any spills in your garage right away. This includes oil or grease spills. What can I do in the bathroom? Use night lights. Install grab bars by the toilet and in the tub and shower. Do not use towel bars as grab bars. Use non-skid mats or decals in the tub or shower. If you need to sit down in the shower, use a plastic, non-slip stool. Keep the floor dry. Clean up any water that spills on the floor as soon as it  happens. Remove soap buildup in the tub or shower regularly. Attach bath mats securely with double-sided non-slip rug tape. Do not have throw rugs and other things on the floor that can make you trip. What can I do in the bedroom? Use night lights. Make sure that you have a light by your bed that is easy to reach. Do not use any sheets or blankets that are too big for your bed. They should not hang down onto the floor. Have a firm chair that has side arms. You can use this for support while you get dressed. Do not have throw rugs and other things on the floor that can make you trip. What can I do in the kitchen? Clean up any spills right away. Avoid walking on wet floors. Keep items that you use a lot in easy-to-reach places. If you need to reach something above you, use a strong step stool that has a grab bar. Keep electrical cords out of the way. Do not use floor polish or wax that makes floors slippery. If you must use wax, use non-skid floor wax. Do not have throw rugs and other things on the floor that can make you trip. What can I do with my stairs? Do not leave any items on the stairs. Make sure that there are handrails on both sides of the stairs and use them. Fix handrails that are broken or loose. Make sure that handrails are as long  as the stairways. Check any carpeting to make sure that it is firmly attached to the stairs. Fix any carpet that is loose or worn. Avoid having throw rugs at the top or bottom of the stairs. If you do have throw rugs, attach them to the floor with carpet tape. Make sure that you have a light switch at the top of the stairs and the bottom of the stairs. If you do not have them, ask someone to add them for you. What else can I do to help prevent falls? Wear shoes that: Do not have high heels. Have rubber bottoms. Are comfortable and fit you well. Are closed at the toe. Do not wear sandals. If you use a stepladder: Make sure that it is fully opened.  Do not climb a closed stepladder. Make sure that both sides of the stepladder are locked into place. Ask someone to hold it for you, if possible. Clearly mark and make sure that you can see: Any grab bars or handrails. First and last steps. Where the edge of each step is. Use tools that help you move around (mobility aids) if they are needed. These include: Canes. Walkers. Scooters. Crutches. Turn on the lights when you go into a dark area. Replace any light bulbs as soon as they burn out. Set up your furniture so you have a clear path. Avoid moving your furniture around. If any of your floors are uneven, fix them. If there are any pets around you, be aware of where they are. Review your medicines with your doctor. Some medicines can make you feel dizzy. This can increase your chance of falling. Ask your doctor what other things that you can do to help prevent falls. This information is not intended to replace advice given to you by your health care provider. Make sure you discuss any questions you have with your health care provider. Document Released: 06/12/2009 Document Revised: 01/22/2016 Document Reviewed: 09/20/2014 Elsevier Interactive Patient Education  2017 [employer].

## 2021-07-15 NOTE — Progress Notes (Signed)
Subjective:   Jenny Cooper is a 85 y.o. female who presents for Medicare Annual (Subsequent) preventive examination.  Virtual Visit via Telephone Note  I connected with  Jenny Cooper on 07/15/21 at 11:20 AM EST by telephone and verified that I am speaking with the correct person using two identifiers.  Location: Patient: home Provider: [facility] Persons participating in the virtual visit: [place]   I discussed the limitations, risks, security and privacy concerns of performing an evaluation and management service by telephone and the availability of in person appointments. The patient expressed understanding and agreed to proceed.  Interactive audio and video telecommunications were attempted between this nurse and patient, however failed, due to patient having technical difficulties OR patient did not have access to video capability.  We continued and completed visit with audio only.  Some vital signs may be absent or patient reported.   Clemetine Marker, LPN   Review of Systems     Cardiac Risk Factors include: advanced age (>18mn, >>58women);dyslipidemia;hypertension;sedentary lifestyle     Objective:    Today's Vitals   07/15/21 1134  PainSc: 2    There is no height or weight on file to calculate BMI.  Advanced Directives 07/15/2021 06/26/2021 06/02/2021 05/28/2021 05/19/2021 06/02/2020 10/15/2019  Does Patient Have a Medical Advance Directive? Yes Yes Yes Yes No Yes Yes  Type of AParamedicof [place] will [place] will [place]Living will - - HRoyalLiving will HEtna GreenLiving will  Does patient want to make changes to medical advance directive? - No - Patient declined No - Patient declined - - - -  Copy of H[place]in Chart? No - copy requested No - copy requested No - copy requested - - No - copy requested -   Would patient like information on creating a medical advance directive? - - No - Patient declined - No - Patient declined - -    Current Medications (verified) Outpatient Encounter Medications as of 07/15/2021  Medication Sig   acetaminophen (TYLENOL) 500 MG tablet Take 500-1,000 mg by mouth every 4 (four) hours as needed for mild pain or moderate pain.   apixaban (ELIQUIS) 5 MG TABS tablet Take 1 tablet (5 mg total) by mouth 2 (two) times daily.   budesonide (PULMICORT) 0.5 MG/2ML nebulizer solution INHALE 2 MILLILITERS BY NEBULIZER 2 TIMES DAILY   cetirizine (ZYRTEC) 10 MG tablet TAKE (1) TABLET BY MOUTH EVERY DAY   fluticasone (FLONASE) 50 MCG/ACT nasal spray Place 2 sprays into both nostrils daily.   furosemide (LASIX) 20 MG tablet Take 1 tablet (20 mg total) by mouth 2 (two) times daily. TAKE 1 TABLET BY MOUTH TWICE DAILY, ALTERNATING WITH 1 EXTRA TABLET AS NEEDED when weight up by 3 lbs   guaiFENesin (MUCINEX) 600 MG 12 hr tablet Take 1 tablet (600 mg total) by mouth 2 (two) times daily as needed.   ipratropium-albuterol (DUONEB) 0.5-2.5 (3) MG/3ML SOLN TAKE 3 MLS BY NEBULIZATION EVERY 6 HOURSAS NEEDED FOR WHEEZING OR SHORTNESS OF BREATH   levothyroxine (SYNTHROID) 88 MCG tablet TAKE (1) TABLET BY MOUTH EVERY DAY BEFORE BREAKFAST   metoprolol succinate (TOPROL-XL) 50 MG 24 hr tablet Take 1 tablet (50 mg total) by mouth daily. Take with or immediately following a meal.   midodrine (PROAMATINE) 5 MG tablet Take 1 tablet (5 mg total) by mouth 3 (three) times daily with meals.   Multiple Vitamins-Minerals (CENTRUM  SILVER PO) Take 1 tablet by mouth daily.   OXYGEN Inhale 3 L into the lungs.    potassium chloride SA (KLOR-CON) 20 MEQ tablet Take 1 tablet (20 mEq total) by mouth daily.   rosuvastatin (CRESTOR) 5 MG tablet TAKE ONE (1) TABLET BY MOUTH ONCE DAILY (Patient taking differently: Take 5 mg by mouth at bedtime.)   vitamin B-12 (CYANOCOBALAMIN) 1000 MCG tablet Take 1,000 mcg by mouth  daily.   Vitamins A & D (VITAMIN A & D) ointment Apply 1 application topically as needed (irritation).   YUPELRI 175 MCG/3ML nebulizer solution Use one vial in nebulizer once daily. Do not mix with other nebulized medications.   [DISCONTINUED] ketorolac (ACULAR) 0.5 % ophthalmic solution Place 1 drop into the right eye in the morning, at noon, and at bedtime.   Facility-Administered Encounter Medications as of 07/15/2021  Medication   sodium chloride 0.9 % injection 10 mL   sodium chloride flush (NS) 0.9 % injection 10 mL    Allergies (verified) Cefdinir, Lovenox [enoxaparin sodium], and Meperidine   History: Past Medical History:  Diagnosis Date   Acute midline low back pain without sciatica 08/13/2016   Acute on chronic respiratory failure with hypoxia ([place]) 08/21/2018   Acute respiratory failure with hypoxia ([place]) 12/11/2017   Allergy    Aortic atherosclerosis ([place])    Atypical atrial flutter ([place])    a.) s/p ablation 07/27/2013 and 05/10/2016   Balance problem    CAD (coronary artery disease)    a. s/p MI x 2 in 2002 s/p PCI x 2 in 2002; b. s/p 2v CABG 2002; c. stress echo 07/2004 w/ evi of pos & inf infarct & no evi of ischemia; d. 4/08 dipyridamole scan w/ multiple areas of infarct, no ischemia, EF 49%; e. cath 04/28/15 3v CAD, med Rx rec, no targets for revasc, LM lum irregs, pLAD 30%, 100%, ost-pLCx 60%, [name] 99%, OM2 100%, p-mRCA 90%, [name] 100% L-R collats, VG-mLAD irregs, VG-OM2 oc   Chronic anticoagulation    a.) Apixaban   Chronic systolic CHF (congestive heart failure) ([place])    a. echo 03/2015: EF 30-35%, sev ant/inf/pos HK, in mild to mod MR   Complication of anesthesia    a.) postoperative apnea. b.) postoperative hypoxia.   Compressed spine fracture ([place]) 08/06/2016   a.) L2, T11, T12   COPD (chronic obstructive pulmonary disease) (HCC)    Deep vein thrombosis (DVT) of left lower extremity ([place]) 12/2001   Fracture of multiple pubic rami, right, closed, initial  encounter ([place]) 05/27/2016   a.) RIGHT superior and inferior pubic rami fractures s/p mechanical fall.   GERD (gastroesophageal reflux disease)    HLD (hyperlipidemia)    HTN (hypertension)    Hypothyroidism    Ischemic cardiomyopathy    Mitral regurgitation    a. s/p mitral ring placement 09/2000; b. echo 09/2010: EF 50%, inf HK, post AK, mild MR, prosthetic mitral valve ring w/ peak gradient of 10 mmHg; b. echo 2/13: EF 50%, mild MR/TR      Myocardial infarction ([place]) 2002   a.) x 2 in 2002; PCI with stents (unknown type) placed to the p-mRCA, [name], and [name].   Neuropathy    Osteoarthritis    Osteoporosis    Oxygen dependent    a.) 3 L/[place]   Pacemaker    a. MDT 2002; b. generator replacement 2013; c. followed by Dr. Omelia Black[place], MD   PAF (paroxysmal atrial fibrillation) [facility])    a.) s/p DCCV on 04/08/2017. b.)  on apixaban   Personal history of chemotherapy    Personal history of radiation therapy    Pneumonia    S/P CABG x 2 2002   a.) SVG-LAD, SVG-OM   Squamous cell carcinoma of right lung ([place]) 01/03/2015   a.) stage IIIa. b.) s/p concurrent chemotherapy (carboplatin + paclitaxel) and XRT (IMRT 6000 cGy)   SVT (supraventricular tachycardia) (HCC)    Type II diabetes mellitus with complication ([place]) 70/17/7939   Past Surgical History:  Procedure Laterality Date   APPENDECTOMY     CARDIAC CATHETERIZATION N/A 04/28/2015   Procedure: Left Heart Cath and Coronary Angiography;  Surgeon: Wellington Hampshire, MD;  Location: [facility];  Service: Cardiovascular;  Laterality: N/A;   CARDIAC ELECTROPHYSIOLOGY STUDY AND ABLATION N/A 05/10/2016   Procedure(s): INTRACARDIAC ELECTROPHYSIOLOGIC 3D MAPPING, STIMULATION PACING HEART, ICAR CATHETER ABLATION ARRHYTHMIA ADD ON, ABLATE L/R ATRIAL FIBRIL W/ISOLATED PULM VEIN, INTRACARD ECHO, THER/DX INTERVENT; Location: [facility]; Surgeon: Norm Salt, MD   CARDIOVERSION N/A 04/08/2017   Procedure: CARDIOVERSION;  Surgeon: Wellington Hampshire, MD;   Location: [facility];  Service: Cardiovascular;  Laterality: N/A;   CHEST TUBE INSERTION N/A 07/11/2018   Procedure: QZESPQ CATH INSERTION;  Surgeon: [name], MD;  Location: [facility];  Service: Thoracic;  Laterality: N/A;   COLONOSCOPY  12/2009   2 small tubular adenomas   CORONARY ANGIOPLASTY WITH STENT PLACEMENT Left 2002   s/p MI x 2 with subsequent PCI x 2 ; stents (unknown type) placed to the p-mRCA, [name], and [name].   CORONARY ARTERY BYPASS GRAFT  09/2000   DRAIN REMOVAL Right 08/10/2018   Procedure: DRAIN REMOVAL;  Surgeon: [name], MD;  Location: [facility];  Service: General;  Laterality: Right;   ECTOPIC PREGNANCY SURGERY     ELECTROMAGNETIC NAVIGATION BROCHOSCOPY N/A 10/06/2015   Procedure: ELECTROMAGNETIC NAVIGATION BRONCHOSCOPY;  Surgeon: Flora Lipps, MD;  Location: [facility];  Service: Cardiopulmonary;  Laterality: N/A;   ENDOBRONCHIAL ULTRASOUND N/A 10/06/2015   Procedure: ENDOBRONCHIAL ULTRASOUND;  Surgeon: Flora Lipps, MD;  Location: [facility];  Service: Cardiopulmonary;  Laterality: N/A;   EYE SURGERY     FLEXIBLE BRONCHOSCOPY Right 07/11/2018   Procedure: FLEXIBLE BRONCHOSCOPY;  Surgeon: [name], MD;  Location: [facility];  Service: Thoracic;  Laterality: Right;   KYPHOPLASTY N/A 09/28/2016   Procedure: KYPHOPLASTY;  Surgeon: Hessie Knows, MD;  Location: [facility];  Service: Orthopedics;  Laterality: N/A;   KYPHOPLASTY N/A 02/20/2018   Procedure: [id number];  Surgeon: Hessie Knows, MD;  Location: [facility];  Service: Orthopedics;  Laterality: N/A;   PACEMAKER INSERTION  03/2012   REVERSE SHOULDER ARTHROPLASTY Right 06/02/2021   Procedure: REVERSE SHOULDER ARTHROPLASTY;  Surgeon: Corky Mull, MD;  Location: [facility];  Service: Orthopedics;  Laterality: Right;   SVT ABLATION N/A 07/27/2013   Procedure: SVT ABLATION; Location: [facility]; Surgeon: Rolla Etienne, MD   thorocentesis  12/24/2016   VAGINAL HYSTERECTOMY     partial - left ovary remains   VIDEO  ASSISTED THORACOSCOPY Right 07/11/2018   Procedure: VIDEO ASSISTED THORACOSCOPY;  Surgeon: [name], MD;  Location: [facility];  Service: Thoracic;  Laterality: Right;   VIDEO ASSISTED THORACOSCOPY (VATS) W/TALC PLEUADESIS Right 07/11/2018   Procedure: THORACOTOMY PLEURAL BIOPSY WITH TALC PLEURODESIS;  Surgeon: [name], MD;  Location: [facility];  Service: Thoracic;  Laterality: Right;  pleural biopsy   Family History  Problem Relation Age of Onset   COPD Mother        sister, and brother   Lung  disease Father    Stroke Maternal Grandmother    Hypertension Sister    COPD Sister    Hypertension Brother    COPD Brother    Colon cancer Brother    Heart attack Neg Hx    Breast cancer Neg Hx    Social History   Socioeconomic History   Marital status: Widowed    Spouse name: Not on file   Number of children: 6   Years of education: Not on file   Highest education level: Some college, no degree  Occupational History   Occupation: Retired  Tobacco Use   Smoking status: Former    Packs/day: 1.00    Years: 40.00    Pack years: 40.00    Types: Cigarettes    Quit date: 2001    Years since quitting: 21.8   Smokeless tobacco: Never   Tobacco comments:    quit smoking in 08/28/2000. Smoking cessation materials not required  Vaping Use   Vaping Use: Never used  Substance and Sexual Activity   Alcohol use: Yes    Alcohol/week: 10.0 standard drinks    Types: 10 Glasses of wine per week    Comment: 1 glass of wine per day   Drug use: No   Sexual activity: Never  Other Topics Concern   Not on file  Social History Narrative   Pt's son lives with her   Social Determinants of Health   Financial Resource Strain: Low Risk    Difficulty of Paying Living Expenses: Not very hard  Food Insecurity: No Food Insecurity   Worried About Charity fundraiser in the Last Year: Never true   Ran Out of Food in the Last Year: Never true  Transportation Needs: No Transportation Needs    Lack of Transportation (Medical): No   Lack of Transportation (Non-Medical): No  Physical Activity: Insufficiently Active   Days of Exercise per Week: 2 days   Minutes of Exercise per Session: 60 min  Stress: No Stress Concern Present   Feeling of Stress : Not at all  Social Connections: Moderately Isolated   Frequency of Communication with Friends and Family: More than three times a week   Frequency of Social Gatherings with Friends and Family: Once a week   Attends Religious Services: More than 4 times per year   Active Member of [employer] or Organizations: No   Attends Archivist Meetings: Never   Marital Status: Widowed    Tobacco Counseling Counseling given: Not Answered Tobacco comments: quit smoking in 08/28/2000. Smoking cessation materials not required   Clinical Intake:  Pre-visit preparation completed: Yes  Pain : 0-10 Pain Score: 2  Pain Type: Chronic pain Pain Location: Back Pain Orientation: Mid, Lower Pain Descriptors / Indicators: Aching Pain Onset: More than a month ago Pain Frequency: Constant     Nutritional Risks: None Diabetes: No  How often do you need to have someone help you when you read instructions, pamphlets, or other written materials from your doctor or pharmacy?: 1 - Never   Interpreter Needed?: No  Information entered by :: Clemetine Marker LPN   Activities of Daily Living In your present state of health, do you have any difficulty performing the following activities: 07/15/2021 07/14/2021  Hearing? N N  Vision? N N  Difficulty concentrating or making decisions? N N  Comment - -  Walking or climbing stairs? Y N  Comment - -  Dressing or bathing? N N  Comment - -  Doing errands,  shopping? N N  Preparing Food and eating ? N -  Using the Toilet? N -  In the past six months, have you accidently leaked urine? Y -  Comment wears pads for protection if needed -  Do you have problems with loss of bowel control? N -  Managing your  Medications? N -  Managing your Finances? N -  Housekeeping or managing your Housekeeping? N -  Some recent data might be hidden    Patient Care Team: Glean Hess, MD as PCP - General (Internal Medicine) Minna Merritts, MD as PCP - Cardiology (Cardiology) Cammie Sickle, MD as Consulting Physician (Internal Medicine) Alisa Graff, FNP as Nurse Practitioner (Cardiology) Flora Lipps, MD as Consulting Physician (Pulmonary Disease) Marvia Pickles, MD as Consulting Physician (Cardiology)  Indicate any recent Medical Services you may have received from other than [facility] providers in the past year (date may be approximate).     Assessment:   This is a routine wellness examination for [place].  Hearing/Vision screen Hearing Screening - Comments::  Pt c/o mild hearing difficulty when someone speaks too fast; declines hearing aids Vision Screening - Comments:: Annual vision screenings done at [facility] issues and exercise activities discussed: Current Exercise Habits: Home exercise routine, Type of exercise: calisthenics;Other - see comments (physical therapy), Time (Minutes): 60, Frequency (Times/Week): 2, Weekly Exercise (Minutes/Week): 120, Intensity: Mild, Exercise limited by: orthopedic condition(s)   Goals Addressed   None    Depression Screen PHQ 2/9 Scores 07/15/2021 11/21/2020 06/02/2020 01/23/2020 10/19/2019 09/28/2019 05/30/2019  PHQ - 2 Score 2 0 0 0 0 0 0  PHQ- 9 Score 2 0 - 0 - - 0    Fall Risk Fall Risk  07/15/2021 07/14/2021 11/21/2020 06/02/2020 01/23/2020  Falls in the past year? 1 0 0 0 0  Number falls in past yr: 1 1 - 0 0  Injury with Fall? 1 1 - 0 0  Risk for fall due to : History of fall(s);Impaired balance/gait History of fall(s);Impaired balance/gait - Impaired balance/gait;Impaired mobility No Fall Risks  Follow up Falls prevention discussed Falls evaluation completed Falls evaluation completed Falls prevention discussed Falls  evaluation completed    FALL RISK PREVENTION PERTAINING TO THE HOME:  Any stairs in or around the home? Yes  If so, are there any without handrails? No  Home free of loose throw rugs in walkways, pet beds, electrical cords, etc? Yes  Adequate lighting in your home to reduce risk of falls? Yes   ASSISTIVE DEVICES UTILIZED TO PREVENT FALLS:  Life alert? No  Use of a cane, walker or w/c? Yes  Grab bars in the bathroom? Yes  Shower chair or bench in shower? Yes  Elevated toilet seat or a handicapped toilet? Yes   TIMED UP AND GO:  Was the test performed? No . Telephonic visit.   Cognitive Function: Normal cognitive status assessed by direct observation by this Nurse Health Advisor. No abnormalities found.       6CIT Screen 06/02/2020 05/30/2019 08/25/2017  What Year? 0 points 0 points 0 points  What month? 0 points 0 points 0 points  What time? 0 points 0 points 0 points  Count back from 20 0 points 0 points 0 points  Months in reverse 2 points 2 points 2 points  Repeat phrase 0 points 0 points 0 points  Total Score _0 Immunizations Immunization History  Administered Date(s) Administered   Fluad Quad(high  Dose 65+) 06/02/2020   Influenza, High Dose Seasonal PF 06/17/2014, 07/04/2018, 07/01/2019   Influenza,inj,Quad PF,6+ Mos 07/01/2014, 05/28/2015, 05/31/2016, 04/21/2017   Influenza-Unspecified 07/20/2011, 05/03/2012, 07/01/2014, 05/28/2015, 05/31/2016, 04/21/2017   Moderna Sars-Covid-2 Vaccination 09/09/2019, 10/10/2019, 06/20/2020   Pneumococcal Conjugate-13 04/21/2017   Pneumococcal Polysaccharide-23 08/03/2011   Zoster, Live 05/01/2011    TDAP status: Due, Education has been provided regarding the importance of this vaccine. Advised may receive this vaccine at local pharmacy or Health Dept. Aware to provide a copy of the vaccination record if obtained from local pharmacy or Health Dept. Verbalized acceptance and understanding.  Flu Vaccine status: Due, Education  has been provided regarding the importance of this vaccine. Advised may receive this vaccine at local pharmacy or Health Dept. Aware to provide a copy of the vaccination record if obtained from local pharmacy or Health Dept. Verbalized acceptance and understanding.  Pneumococcal vaccine status: Up to date  Covid-19 vaccine status: Completed vaccines  Qualifies for Shingles Vaccine? Yes   Zostavax completed Yes   Shingrix Completed?: No.    Education has been provided regarding the importance of this vaccine. Patient has been advised to call insurance company to determine out of pocket expense if they have not yet received this vaccine. Advised may also receive vaccine at local pharmacy or Health Dept. Verbalized acceptance and understanding.  Screening Tests Health Maintenance  Topic Date Due   TETANUS/TDAP  Never done   Zoster Vaccines- Shingrix (1 of 2) Never done   COLONOSCOPY (Pts 45-48yr Insurance coverage will need to be confirmed)  11/25/2014   MAMMOGRAM  03/15/2019   COVID-19 Vaccine (4 - Booster for Moderna series) 08/15/2020   URINE MICROALBUMIN  09/27/2020   INFLUENZA VACCINE  03/30/2021   Pneumonia Vaccine 85 Years old  Completed   DEXA SCAN  Completed   HPV VACCINES  Aged Out    Health Maintenance  Health Maintenance Due  Topic Date Due   TETANUS/TDAP  Never done   Zoster Vaccines- Shingrix (1 of 2) Never done   COLONOSCOPY (Pts 45-417yrInsurance coverage will need to be confirmed)  11/25/2014   MAMMOGRAM  03/15/2019   COVID-19 Vaccine (4 - Booster for Moderna series) 08/15/2020   URINE MICROALBUMIN  09/27/2020   INFLUENZA VACCINE  03/30/2021    Colorectal cancer screening: Pt was recommended to have repeat screening from colonoscopy last done in 2011; pt declined at this time.   Mammogram status: No longer required due to age.  Bone density status: no longer required due to age.   Lung Cancer Screening: (Low Dose CT Chest recommended if Age 83-80ears, 30  pack-year currently smoking OR have quit w/in 15years.) does not qualify.   Additional Screening:  Hepatitis C Screening: does not qualify  Vision Screening: Recommended annual ophthalmology exams for early detection of glaucoma and other disorders of the eye. Is the patient up to date with their annual eye exam?  Yes  Who is the provider or what is the name of the office in which the patient attends annual [facility] exams[facility]  Dental Screening: Recommended annual dental exams for proper oral hygiene  Community Resource Referral / Chronic Care Management: CRR required this visit?  No   CCM required this visit?  No      Plan:     I have personally reviewed and noted the following in the patient's chart:   Medical and social history Use of alcohol, tobacco or illicit drugs  Current medications and supplements  including opioid prescriptions.  Functional ability and status Nutritional status Physical activity Advanced directives List of other physicians Hospitalizations, surgeries, and ER visits in previous 12 months Vitals Screenings to include cognitive, depression, and falls Referrals and appointments  In addition, I have reviewed and discussed with patient certain preventive protocols, quality metrics, and best practice recommendations. A written personalized care plan for preventive services as well as general preventive health recommendations were provided to patient.     Clemetine Marker, LPN   [id number]   Nurse Notes: none

## 2021-07-17 ENCOUNTER — Other Ambulatory Visit: Payer: Self-pay | Admitting: Internal Medicine

## 2021-07-17 DIAGNOSIS — J441 Chronic obstructive pulmonary disease with (acute) exacerbation: Secondary | ICD-10-CM

## 2021-07-20 ENCOUNTER — Inpatient Hospital Stay: Payer: Medicare Other | Admitting: Internal Medicine

## 2021-07-20 ENCOUNTER — Other Ambulatory Visit: Payer: Self-pay

## 2021-08-03 ENCOUNTER — Ambulatory Visit: Payer: Medicare Other | Admitting: Cardiovascular Disease

## 2021-08-03 ENCOUNTER — Encounter: Payer: Self-pay | Admitting: Cardiovascular Disease

## 2021-08-04 ENCOUNTER — Ambulatory Visit: Payer: Self-pay

## 2021-08-04 ENCOUNTER — Other Ambulatory Visit: Payer: Self-pay

## 2021-08-04 MED ORDER — POTASSIUM CHLORIDE CRYS ER 20 MEQ PO TBCR: 20.0000 meq | EXTENDED_RELEASE_TABLET | Freq: Every day | ORAL | 3 refills | Status: DC

## 2021-08-04 MED ORDER — FUROSEMIDE 20 MG PO TABS
20.0000 mg | ORAL_TABLET | Freq: Two times a day (BID) | ORAL | 3 refills | Status: DC
Start: 2021-08-04 — End: 2022-05-11

## 2021-08-04 NOTE — Telephone Encounter (Signed)
Covid sx started Monday night and today pt had a positive covid home test. Pt having muscle aches, chills, fatigue and dry cough. Pt stated her SOB is stable and pulse ox is 93% which is her baseline. Pt having wheezing and is using her COPD medication and wearing her oxygen.  Pt requesting Paxlovid sent to [facility] in [place]. Pt also wants to know if there is any drug interactions with Paxlovid. Care advice given        Reason for Disposition  [1] HIGH RISK for severe COVID complications (e.g., weak immune system, age > 6 years, obesity with BMI > 25, pregnant, chronic lung disease or other chronic medical condition) AND [2] COVID symptoms (e.g., cough, fever)  (Exceptions: Already seen by PCP and no new or worsening symptoms.)  Answer Assessment - Initial Assessment Questions 1. COVID-19 DIAGNOSIS: "Who made your COVID-19 diagnosis?" "Was it confirmed by a positive lab test or self-test?" If not diagnosed by a doctor (or NP/PA), ask "Are there lots of cases (community spread) where you live?" Note: See public health department website, if unsure.     Self test 2. COVID-19 EXPOSURE: "Was there any known exposure to COVID before the symptoms began?" CDC Definition of close contact: within 6 feet (2 meters) for a total of 15 minutes or more over a 24-hour period.      son 3. ONSET: "When did the COVID-19 symptoms start?"      Last night 4. WORST SYMPTOM: "What is your worst symptom?" (e.g., cough, fever, shortness of breath, muscle aches)     Muscle aches and cough 5. COUGH: "Do you have a cough?" If Yes, ask: "How bad is the cough?"       Yes- dry hacking cough. 6. FEVER: "Do you have a fever?" If Yes, ask: "What is your temperature, how was it measured, and when did it start?"     No fever 7. RESPIRATORY STATUS: "Describe your breathing?" (e.g., shortness of breath, wheezing, unable to speak)      Wheezing. SOB stable 8. BETTER-SAME-WORSE: "Are you getting better, staying the same or  getting worse compared to yesterday?"  If getting worse, ask, "In what way?"     ame 9. HIGH RISK DISEASE: "Do you have any chronic medical problems?" (e.g., asthma, heart or lung disease, weak immune system, obesity, etc.)     COPD, lung cancer, a fib 10. VACCINE: "Have you had the COVID-19 vaccine?" If Yes, ask: "Which one, how many shots, when did you get it?"       Yes- 2 vaccines 11. BOOSTER: "Have you received your COVID-19 booster?" If Yes, ask: "Which one and when did you get it?"       1 booster 12. PREGNANCY: "Is there any chance you are pregnant?" "When was your last menstrual period?"       N/a 13. OTHER SYMPTOMS: "Do you have any other symptoms?"  (e.g., chills, fatigue, headache, loss of smell or taste, muscle pain, sore throat)       Chills, fatigue 14. O2 SATURATION MONITOR:  "Do you use an oxygen saturation monitor (pulse oximeter) at home?" If Yes, ask "What is your reading (oxygen level) today?" "What is your usual oxygen saturation reading?" (e.g., 95%)       93%  baseline 91-93% Oxygen 2  LPM  Protocols used: Coronavirus (COVID-19) Diagnosed or Suspected-A-AH

## 2021-08-04 NOTE — Telephone Encounter (Signed)
Patient called, left VM to return the call to the office to discuss symptoms with a nurse.    Message from Bluefield Callas sent at 08/04/2021  3:00 PM EST  Pt 's daughter just called saying mom just tested positive for covid .  She would like to know if Paxlovid could be sent to her pharmacy.  She is having some mild symptoms.   [place]   CB#  [phone]

## 2021-08-05 ENCOUNTER — Other Ambulatory Visit: Payer: Self-pay | Admitting: Internal Medicine

## 2021-08-05 ENCOUNTER — Encounter: Payer: Self-pay | Admitting: Internal Medicine

## 2021-08-05 ENCOUNTER — Other Ambulatory Visit: Payer: Self-pay

## 2021-08-05 ENCOUNTER — Telehealth (INDEPENDENT_AMBULATORY_CARE_PROVIDER_SITE_OTHER): Payer: Medicare Other | Admitting: Internal Medicine

## 2021-08-05 VITALS — BP 132/74 | HR 137 | Temp 97.6°F | Ht 66.0 in | Wt 140.5 lb

## 2021-08-05 DIAGNOSIS — J9611 Chronic respiratory failure with hypoxia: Secondary | ICD-10-CM | POA: Diagnosis not present

## 2021-08-05 DIAGNOSIS — I471 Supraventricular tachycardia: Secondary | ICD-10-CM | POA: Diagnosis not present

## 2021-08-05 DIAGNOSIS — I25709 Atherosclerosis of coronary artery bypass graft(s), unspecified, with unspecified angina pectoris: Secondary | ICD-10-CM | POA: Diagnosis not present

## 2021-08-05 DIAGNOSIS — U071 COVID-19: Secondary | ICD-10-CM

## 2021-08-05 MED ORDER — NIRMATRELVIR/RITONAVIR (PAXLOVID) TABLET (RENAL DOSING): 2.0000 | ORAL_TABLET | Freq: Two times a day (BID) | ORAL | 0 refills | Status: AC

## 2021-08-05 NOTE — Patient Instructions (Signed)
Hold Crestor for 5 days  Reduce dose of Eliquis to 2.5 mg bid for 5 days  Take Tylenol around the clock for HA and body aches  Push fluids, rest but go to ED if O2 sat goes below 88% or symptoms worsen

## 2021-08-05 NOTE — Progress Notes (Signed)
Date:  08/05/2021   Name:  Jenny Cooper   DOB:  Jan 08, 1936   MRN:  [id number]  This encounter was attempted to be conducted via video encounter due to the need for social distancing in light of the Covid-19 pandemic.  The patient was correctly identified.  I advised that I am conducting the visit from a secure room in my office at [facility] clinic.  The patient is located at home. The limitations of this form of encounter were discussed with the patient and he/she agreed to proceed.  Some vital signs will be absent. Pt was unable to connect for video so the entire encounter was done via telephone.  Chief Complaint: Covid Positive (Tested Positive for Covid yesterday.)  Cough This is a new problem. Episode onset: three days ago. The problem has been unchanged. The problem occurs every few minutes. The cough is Non-productive. Associated symptoms include chills, a fever, headaches, myalgias, shortness of breath and wheezing. Pertinent negatives include no chest pain.  Risk factors for Covid are many - listed below.  O2 sats are staying over 90% - usually 90-93 % when not ill.  Has O2 at 2 lpm chronically.  Using Robitussin DM over the counter cough syrup.  Is Immunocompromised : No   Age: 85   Legal Sex: Female   Has Chronic Pulmonary Disease: Yes   Has Congenital Heart Disease : No   Has Congestive Heart Failure: Yes   Has Coronary Artery Disease: Yes   Has Diabetes: No   Has End-Stage Liver Disease: Yes   Has End-Stage Renal Disease: No   Has Hypertension: Yes   Lab Results  Component Value Date   CHOL 131 08/25/2017   HDL 36 (L) 08/25/2017   LDLCALC 63 08/25/2017   TRIG 159 (H) 08/25/2017   CHOLHDL 3.6 08/25/2017   Lab Results  Component Value Date   TSH 10.04 (A) 01/22/2020   Lab Results  Component Value Date   HGBA1C 5.5 09/28/2019   Lab Results  Component Value Date   WBC 5.0 06/27/2021   HGB 11.3 (L) 06/27/2021   HCT 35.7 (L) 06/27/2021   MCV 94.7  06/27/2021   PLT 216 06/27/2021   Lab Results  Component Value Date   ALT 8 06/23/2021   AST 22 06/23/2021   ALKPHOS 82 06/23/2021   BILITOT 1.5 (H) 06/23/2021   No results found for: 25OHVITD2, 25OHVITD3, VD25OH   Review of Systems  Constitutional:  Positive for chills, fatigue and fever.  HENT:  Negative for trouble swallowing.   Respiratory:  Positive for cough, shortness of breath and wheezing. Negative for chest tightness.   Cardiovascular:  Negative for chest pain and palpitations.  Gastrointestinal:  Negative for diarrhea, nausea and vomiting.  Musculoskeletal:  Positive for myalgias.  Neurological:  Positive for headaches.  Psychiatric/Behavioral:  Positive for sleep disturbance.    Patient Active Problem List   Diagnosis Date Noted   CHF (congestive heart failure) ([place]) 06/25/2021   Acute exacerbation of CHF (congestive heart failure) ([place]) 06/23/2021   Hypokalemia 06/23/2021   Troponin level elevated 06/23/2021   Status post reverse arthroplasty of shoulder, right 06/02/2021   Closed fracture of right proximal humerus 05/28/2021   Age-related cataract of both eyes 04/29/2021   Squamous cell lung cancer ([place]) 02/11/2020   Drug-induced polyneuropathy ([place]) 01/23/2020   Low vitamin B12 level 01/14/2019   Pulmonary hypertension, unspecified ([place]) 12/01/2018   Chronic respiratory failure with hypoxia ([place]) 12/01/2018   Normocytic  anemia 10/17/2018   Recurrent pleural effusion on right 07/11/2018   Environmental and seasonal allergies 12/20/2017   Muscle spasms of neck 12/20/2017   Coronary artery disease of bypass graft of native heart with stable angina pectoris ([place]) 04/21/2017   Chronic combined systolic and diastolic CHF (congestive heart failure) ([place]) 04/14/2017   Sinus node dysfunction ([place]) 04/06/2017   Back pain of thoracolumbar region 03/15/2017   PAF (paroxysmal atrial fibrillation) ([place]) 03/05/2017   Falls frequently 03/05/2017   Compression fracture of  L2 lumbar vertebra ([place]) 08/31/2016   Cardiomyopathy ([place]) 04/29/2016   Hx of adenomatous colonic polyps 11/17/2015   Radiation pneumonitis ([place]) 10/21/2015   Mitral regurgitation    HLD (hyperlipidemia)    Paroxysmal supraventricular tachycardia (HCC)    NSTEMI (non-ST elevated myocardial infarction) ([place])    Arteriosclerosis of coronary artery 01/11/2015   Hypothyroidism (acquired) 01/11/2015   Disorder of peripheral nervous system 10/04/2014   Cervical radiculopathy, chronic 10/04/2014   Atrial flutter ([place]) 11/16/2013   Chronic obstructive pulmonary disease ([place]) 04/24/2012   Essential hypertension 08/03/2011   Pacemaker 08/03/2011    Allergies  Allergen Reactions   Cefdinir Rash    Had hives after completing treatment - unsure if it was the cause.  Has received 1st generation cephalosporin many times (04/24/2012, 07/27/2013, 09/28/2016, 02/20/2018, 07/11/2018, 08/10/2018 without documented ADRs.   Lovenox [Enoxaparin Sodium] Itching   Meperidine Other (See Comments)    Other Reaction: pt does not like how it makes her feel Other reaction(s): Other (See Comments) Other Reaction: pt does not like how it makes her feel    Past Surgical History:  Procedure Laterality Date   APPENDECTOMY     CARDIAC CATHETERIZATION N/A 04/28/2015   Procedure: Left Heart Cath and Coronary Angiography;  Surgeon: Wellington Hampshire, MD;  Location: [facility];  Service: Cardiovascular;  Laterality: N/A;   CARDIAC ELECTROPHYSIOLOGY STUDY AND ABLATION N/A 05/10/2016   Procedure(s): INTRACARDIAC ELECTROPHYSIOLOGIC 3D MAPPING, STIMULATION PACING HEART, ICAR CATHETER ABLATION ARRHYTHMIA ADD ON, ABLATE L/R ATRIAL FIBRIL W/ISOLATED PULM VEIN, INTRACARD ECHO, THER/DX INTERVENT; Location: [facility]; Surgeon: Norm Salt, MD   CARDIOVERSION N/A 04/08/2017   Procedure: CARDIOVERSION;  Surgeon: Wellington Hampshire, MD;  Location: [facility];  Service: Cardiovascular;  Laterality: N/A;   CHEST TUBE INSERTION  N/A 07/11/2018   Procedure: ZMOQHU CATH INSERTION;  Surgeon: [name], MD;  Location: [facility];  Service: Thoracic;  Laterality: N/A;   COLONOSCOPY  12/2009   2 small tubular adenomas   CORONARY ANGIOPLASTY WITH STENT PLACEMENT Left 2002   s/p MI x 2 with subsequent PCI x 2 ; stents (unknown type) placed to the p-mRCA, [name], and [name].   CORONARY ARTERY BYPASS GRAFT  09/2000   DRAIN REMOVAL Right 08/10/2018   Procedure: DRAIN REMOVAL;  Surgeon: [name], MD;  Location: [facility];  Service: General;  Laterality: Right;   ECTOPIC PREGNANCY SURGERY     ELECTROMAGNETIC NAVIGATION BROCHOSCOPY N/A 10/06/2015   Procedure: ELECTROMAGNETIC NAVIGATION BRONCHOSCOPY;  Surgeon: Flora Lipps, MD;  Location: [facility];  Service: Cardiopulmonary;  Laterality: N/A;   ENDOBRONCHIAL ULTRASOUND N/A 10/06/2015   Procedure: ENDOBRONCHIAL ULTRASOUND;  Surgeon: Flora Lipps, MD;  Location: [facility];  Service: Cardiopulmonary;  Laterality: N/A;   EYE SURGERY     FLEXIBLE BRONCHOSCOPY Right 07/11/2018   Procedure: FLEXIBLE BRONCHOSCOPY;  Surgeon: [name], MD;  Location: [facility];  Service: Thoracic;  Laterality: Right;   KYPHOPLASTY N/A 09/28/2016   Procedure: KYPHOPLASTY;  Surgeon: Hessie Knows, MD;  Location: [facility];  Service: Orthopedics;  Laterality: N/A;   KYPHOPLASTY N/A 02/20/2018   Procedure: [id number];  Surgeon: Hessie Knows, MD;  Location: [facility];  Service: Orthopedics;  Laterality: N/A;   PACEMAKER INSERTION  03/2012   REVERSE SHOULDER ARTHROPLASTY Right 06/02/2021   Procedure: REVERSE SHOULDER ARTHROPLASTY;  Surgeon: Corky Mull, MD;  Location: [facility];  Service: Orthopedics;  Laterality: Right;   SVT ABLATION N/A 07/27/2013   Procedure: SVT ABLATION; Location: [facility]; Surgeon: Rolla Etienne, MD   thorocentesis  12/24/2016   VAGINAL HYSTERECTOMY     partial - left ovary remains   VIDEO ASSISTED THORACOSCOPY Right 07/11/2018   Procedure: VIDEO ASSISTED THORACOSCOPY;   Surgeon: [name], MD;  Location: [facility];  Service: Thoracic;  Laterality: Right;   VIDEO ASSISTED THORACOSCOPY (VATS) W/TALC PLEUADESIS Right 07/11/2018   Procedure: THORACOTOMY PLEURAL BIOPSY WITH TALC PLEURODESIS;  Surgeon: [name], MD;  Location: [facility];  Service: Thoracic;  Laterality: Right;  pleural biopsy    Social History   Tobacco Use   Smoking status: Former    Packs/day: 1.00    Years: 40.00    Pack years: 40.00    Types: Cigarettes    Quit date: 2001    Years since quitting: 21.9   Smokeless tobacco: Never   Tobacco comments:    quit smoking in 08/28/2000. Smoking cessation materials not required  Vaping Use   Vaping Use: Never used  Substance Use Topics   Alcohol use: Yes    Alcohol/week: 10.0 standard drinks    Types: 10 Glasses of wine per week    Comment: 1 glass of wine per day   Drug use: No     Medication list has been reviewed and updated.  Current Meds  Medication Sig   acetaminophen (TYLENOL) 500 MG tablet Take 500-1,000 mg by mouth every 4 (four) hours as needed for mild pain or moderate pain.   apixaban (ELIQUIS) 5 MG TABS tablet Take 1 tablet (5 mg total) by mouth 2 (two) times daily.   budesonide (PULMICORT) 0.5 MG/2ML nebulizer solution INHALE 2 MILLILITERS BY NEBULIZER 2 TIMES DAILY   cetirizine (ZYRTEC) 10 MG tablet TAKE (1) TABLET BY MOUTH EVERY DAY   fluticasone (FLONASE) 50 MCG/ACT nasal spray Place 2 sprays into both nostrils daily.   furosemide (LASIX) 20 MG tablet Take 1 tablet (20 mg total) by mouth 2 (two) times daily. TAKE 1 TABLET BY MOUTH TWICE DAILY, ALTERNATING WITH 1 EXTRA TABLET AS NEEDED when weight up by 3 lbs   ipratropium-albuterol (DUONEB) 0.5-2.5 (3) MG/3ML SOLN TAKE 3 MLS BY NEBULIZATION EVERY 6 HOURSAS NEEDED FOR WHEEZING OR SHORTNESS OF BREATH   levothyroxine (SYNTHROID) 88 MCG tablet TAKE (1) TABLET BY MOUTH EVERY DAY BEFORE BREAKFAST   metoprolol succinate (TOPROL-XL) 50 MG 24 hr tablet Take 1 tablet (50  mg total) by mouth daily. Take with or immediately following a meal.   Multiple Vitamins-Minerals (CENTRUM SILVER PO) Take 1 tablet by mouth daily.   OXYGEN Inhale 3 L into the lungs.    potassium chloride SA (KLOR-CON M) 20 MEQ tablet Take 1 tablet (20 mEq total) by mouth daily.   rosuvastatin (CRESTOR) 5 MG tablet TAKE ONE (1) TABLET BY MOUTH ONCE DAILY (Patient taking differently: Take 5 mg by mouth at bedtime.)   vitamin B-12 (CYANOCOBALAMIN) 1000 MCG tablet Take 1,000 mcg by mouth daily.   Vitamins A & D (VITAMIN A & D) ointment Apply 1 application topically as needed (irritation).  YUPELRI 175 MCG/3ML nebulizer solution Use one vial in nebulizer once daily. Do not mix with other nebulized medications.   [DISCONTINUED] guaiFENesin (MUCINEX) 600 MG 12 hr tablet Take 1 tablet (600 mg total) by mouth 2 (two) times daily as needed.    PHQ 2/9 Scores 07/15/2021 11/21/2020 06/02/2020 01/23/2020  PHQ - 2 Score 2 0 0 0  PHQ- 9 Score 2 0 - 0    GAD 7 : Generalized Anxiety Score 11/21/2020 01/23/2020  Nervous, Anxious, on Edge 0 0  Control/stop worrying 0 0  Worry too much - different things 0 0  Trouble relaxing 0 0  Restless 0 0  Easily annoyed or irritable 0 0  Afraid - awful might happen 0 0  Total GAD 7 Score 0 0  Anxiety Difficulty - Not difficult at all    BP Readings from Last 3 Encounters:  08/05/21 132/74  07/14/21 (!) 144/76  07/07/21 130/68    Physical Exam Pulmonary:     Effort: Pulmonary effort is normal.     Comments: Occasional loose cough noted Neurological:     Mental Status: She is alert and oriented to person, place, and time.    Wt Readings from Last 3 Encounters:  08/05/21 140 lb 8 oz (63.7 kg)  07/14/21 140 lb 8 oz (63.7 kg)  07/07/21 143 lb (64.9 kg)    BP 132/74   Pulse (!) 137   Temp 97.6 F (36.4 C) (Oral)   Ht _0  (1.676 m)   Wt 140 lb 8 oz (63.7 kg)   SpO2 90%   BMI 22.68 kg/m   Assessment and Plan: 1. COVID-19 virus infection Pt  instructed in care - tylenol, fluids, rest. Monitor O2 sats and go to ED if < 88% despite O2 Hold crestor and reduce dose of Eliquis to 2.5 mg bid while on Paxlovid - nirmatrelvir/ritonavir EUA, renal dosing, (PAXLOVID) 10 x 150 MG & 10 x 100MG TABS; Take 2 tablets by mouth 2 (two) times daily for 5 days. (Take nirmatrelvir 150 mg one tablet twice daily for 5 days and ritonavir 100 mg one tablet twice daily for 5 days) Patient GFR is 55  Dispense: 20 tablet; Refill: 0  2. Paroxysmal supraventricular tachycardia (HCC) HR reading high on pulse oximeter but pt feels well without chest pain  3. Chronic respiratory failure with hypoxia (HCC) Continue O2 and monitoring  I spent 15 minutes on this encounter. Partially dictated using Editor, commissioning. Any errors are unintentional.  Halina Maidens, MD [facility]  08/05/2021

## 2021-08-05 NOTE — Telephone Encounter (Signed)
Patient scheduled today for VV.

## 2021-08-13 ENCOUNTER — Other Ambulatory Visit: Payer: Self-pay | Admitting: Cardiovascular Disease

## 2021-08-13 NOTE — Telephone Encounter (Signed)
Prescription refill request for Eliquis received. Indication: PAF Last office visit: 07/14/21  Rolena Infante FNP Scr: 1.02 on 07/07/21 Age:  85 Weight: 63.7kg  Based on above findings Eliquis 5mg  twice daily is the appropriate dose.  Refill approved.

## 2021-08-13 NOTE — Telephone Encounter (Signed)
Refill Request.  

## 2021-08-13 NOTE — Telephone Encounter (Signed)
Pt last saw Cadence Furth, [place] on 07/07/21,  last labs 07/07/21 Creat 1.02, age 85, weight 63.7kg, based on specified criteria pt is on appropriate dosage of Eliquis 5mg  BID for afib.  Will refill rx.

## 2021-09-04 ENCOUNTER — Ambulatory Visit (INDEPENDENT_AMBULATORY_CARE_PROVIDER_SITE_OTHER): Payer: Medicare Other | Admitting: Cardiovascular Disease

## 2021-09-04 ENCOUNTER — Encounter: Payer: Self-pay | Admitting: Cardiovascular Disease

## 2021-09-04 ENCOUNTER — Other Ambulatory Visit: Payer: Self-pay

## 2021-09-04 VITALS — BP 100/60 | HR 90 | Ht 66.0 in | Wt 137.4 lb

## 2021-09-04 DIAGNOSIS — I483 Typical atrial flutter: Secondary | ICD-10-CM

## 2021-09-04 DIAGNOSIS — I5042 Chronic combined systolic (congestive) and diastolic (congestive) heart failure: Secondary | ICD-10-CM

## 2021-09-04 DIAGNOSIS — I340 Nonrheumatic mitral (valve) insufficiency: Secondary | ICD-10-CM

## 2021-09-04 DIAGNOSIS — I10 Essential (primary) hypertension: Secondary | ICD-10-CM | POA: Diagnosis not present

## 2021-09-04 MED ORDER — METOPROLOL SUCCINATE ER 50 MG PO TB24: 50.0000 mg | ORAL_TABLET | Freq: Every day | ORAL | 3 refills | Status: DC

## 2021-09-04 MED ORDER — METOPROLOL SUCCINATE ER 50 MG PO TB24: 50.0000 mg | ORAL_TABLET | Freq: Two times a day (BID) | ORAL | 3 refills | Status: DC

## 2021-09-04 NOTE — Progress Notes (Signed)
Date:  09/04/2021   ID:  Jenny Cooper, DOB 01/09/1936, MRN [id number]  Patient Location:  [place] [id number]   Provider location:   [facility], [place] office  PCP:  Glean Hess, MD  Cardiologist:  [facility]  Chief Complaint  Patient presents with   3-4 week follow up     "Doing well." Medications reviewed by the patient verbally.     History of Present Illness:    Jenny Cooper is a 86 y.o. female past medical history of CAD  s/p remote history of MI in 2002  2 vessel CABG post stenting in 2002,  mitral valve repair in 2002 secondary to mitral regurgitation,  PAF TEE/DCCV in 2013 for her PAF. atypical atrial flutter s/p ablation on 07/27/2013  atrial fibrillation and typical atrial flutter ablation [facility]11/2017[facility] s/p MDT PPM,   COPD,  HTN,   HLD  [facility] on 04/01/15 with increased SOB, PNA, elevated troponin.  Quit smoking in 2001 carcinoma of right lung, has completed chemotherapy  falls, and chronic back pain from collapsed vertebrae Suffered pelvic fracture after fall in September 2017 Chronic bronchitis/"copd from covid" EF 50 to 55% She presents today to the clinic for follow-up of her atrial fibrillation/flutter and CAD  LOV 11/2020 Lots of events to discuss on today's visit Covid: several weeks ago  Has had some trauma, "Back fractures" Can't stand very long secondary to pain After a fall, shoulder injury  Hospital 06/28/2021  K of 2.6 with AKI and she was sent to the ER.  She was admitted 10/25-10/31 and treated for CHF with IV lasix.  She was started on midodrine for hypotension.  Toprol was decreased and Entresto held for hypotension.  Echo showed LVEF 40-45%, global HL, moderately elevated pulmonary artery pressure.  Shoulder surgery: 06/02/2021 on right  On subsequent visits metoprolol XL back to 50 daily, Seen by [facility] EP, Toprol 50 back to twice daily for continued  tachypalpitations Delene Loll has been discontinued.  Reports she has stopped the midodrine On Lasix twice daily  Again limited by chronic back pain, walking with a walker Presents with oxygen today  EKG personally reviewed by myself on todays visit Shows atrial flutter ventricular rate 90 bpm no significant ST-T wave changes  Other past medical history reviewed Followed by [facility] EP She underwent cardioversion December 18, 2019, recurrent symptoms Jan 13, 2020, seen by [facility] again Jan 22, 2020, pacemaker changes made Metoprolol increased to 50 twice daily  On a prior clinic office she was in atrial flutter with rapid rate Started on amiodarone after discussion with [facility] EP She had worsening shortness of breath, presented to the emergency room Admitted to the hospital with Cardioversion, 04/08/17 Lasix IV for management of her CHF   Echocardiogram showing EF 30 to 35%, moderately elevated RVSP   Cardioversion at the end of May 2018 for atrial flutter Since that time she has felt relatively well, recent vacation in [place] the past week Reports that she did not feel right at times but unable to put her finger on close going on   CT scan September 2016 showing improvement of her lung cancer She finished her cancer treatment over the summer 2016, she had chemotherapy and radiation   TEE/DCCV in 2013 for her PAF. In 2014 she was diagnosed with atypical atrial flutter and underwent ablation. underwent PPM generator change in 2013.     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    finished concurrent chemoradiation with carboplatinum and Taxol and PET scan showed significant response.     Echo showed EF 30-35%, severe anterior and infero/posterior wall HK. Left ventricular function parameters were normal, mild to moderate MR. Left atrium was mildly dilated. RV systolic function was normal. Mild to moderate TR. PASP was moderately to severely elevated at 60  mm Hg.     outpatient cardiac cath on 04/28/2015 that showed 3 vessel CAD with patent SVG to LAD, occluded SVG to OM, native RCA had severe ISR in the mid-segment and was occluded distally at the sites of previously placed stents. There were left to right collaterals. Moderately reduced LVSF with EF of 35-40%. Mildly elevated LVEDP. There were no good targets for revascularization. medical therapy.    . EF moderately reduced on recent echocardiogram (TTE 40-45% on 06/24/21, [facility]). She has been trialed on amiodarone in the past (d/c 03/2020).   Past Medical History:  Diagnosis Date   Acute midline low back pain without sciatica 08/13/2016   Acute on chronic respiratory failure with hypoxia ([place]) 08/21/2018   Acute respiratory failure with hypoxia ([place]) 12/11/2017   Allergy    Aortic atherosclerosis ([place])    Atypical atrial flutter ([place])    a.) s/p ablation 07/27/2013 and 05/10/2016   Balance problem    CAD (coronary artery disease)    a. s/p MI x 2 in 2002 s/p PCI x 2 in 2002; b. s/p 2v CABG 2002; c. stress echo 07/2004 w/ evi of pos & inf infarct & no evi of ischemia; d. 4/08 dipyridamole scan w/ multiple areas of infarct, no ischemia, EF 49%; e. cath 04/28/15 3v CAD, med Rx rec, no targets for revasc, LM lum irregs, pLAD 30%, 100%, ost-pLCx 60%, [name] 99%, OM2 100%, p-mRCA 90%, [name] 100% L-R collats, VG-mLAD irregs, VG-OM2 oc   Chronic anticoagulation    a.) Apixaban   Chronic systolic CHF (congestive heart failure) ([place])    a. echo 03/2015: EF 30-35%, sev ant/inf/pos HK, in mild to mod MR   Complication of anesthesia    a.) postoperative apnea. b.) postoperative hypoxia.   Compressed spine fracture ([place]) 08/06/2016   a.) L2, T11, T12   COPD (chronic obstructive pulmonary disease) (HCC)    Deep vein thrombosis (DVT) of left lower extremity ([place]) 12/2001   Fracture of multiple pubic rami, right, closed, initial encounter ([place]) 05/27/2016   a.) RIGHT superior and inferior pubic rami  fractures s/p mechanical fall.   GERD (gastroesophageal reflux disease)    HLD (hyperlipidemia)    HTN (hypertension)    Hypothyroidism    Ischemic cardiomyopathy    Mitral regurgitation    a. s/p mitral ring placement 09/2000; b. echo 09/2010: EF 50%, inf HK, post AK, mild MR, prosthetic mitral valve ring w/ peak gradient of 10 mmHg; b. echo 2/13: EF 50%, mild MR/TR      Myocardial infarction ([place]) 2002   a.) x 2 in 2002; PCI with stents (unknown type) placed to the p-mRCA, [name], and [name].   Neuropathy    Osteoarthritis    Osteoporosis    Oxygen dependent    a.) 3 L/[place]   Pacemaker    a. MDT 2002; b. generator replacement 2013; c. followed by Dr. Omelia Blackwater, MD   PAF (paroxysmal atrial fibrillation) [facility])    a.) s/p DCCV on 04/08/2017. b.) on apixaban   Personal history of chemotherapy    Personal  history of radiation therapy    Pneumonia    S/P CABG x 2 2002   a.) SVG-LAD, SVG-OM   Squamous cell carcinoma of right lung ([place]) 01/03/2015   a.) stage IIIa. b.) s/p concurrent chemotherapy (carboplatin + paclitaxel) and XRT (IMRT 6000 cGy)   SVT (supraventricular tachycardia) (HCC)    Type II diabetes mellitus with complication ([place]) 32/99/2426   Past Surgical History:  Procedure Laterality Date   APPENDECTOMY     CARDIAC CATHETERIZATION N/A 04/28/2015   Procedure: Left Heart Cath and Coronary Angiography;  Surgeon: Wellington Hampshire, MD;  Location: [facility];  Service: Cardiovascular;  Laterality: N/A;   CARDIAC ELECTROPHYSIOLOGY STUDY AND ABLATION N/A 05/10/2016   Procedure(s): INTRACARDIAC ELECTROPHYSIOLOGIC 3D MAPPING, STIMULATION PACING HEART, ICAR CATHETER ABLATION ARRHYTHMIA ADD ON, ABLATE L/R ATRIAL FIBRIL W/ISOLATED PULM VEIN, INTRACARD ECHO, THER/DX INTERVENT; Location: [facility]; Surgeon: Norm Salt, MD   CARDIOVERSION N/A 04/08/2017   Procedure: CARDIOVERSION;  Surgeon: Wellington Hampshire, MD;  Location: [facility];  Service: Cardiovascular;  Laterality: N/A;   CHEST  TUBE INSERTION N/A 07/11/2018   Procedure: STMHDQ CATH INSERTION;  Surgeon: [name], MD;  Location: [facility];  Service: Thoracic;  Laterality: N/A;   COLONOSCOPY  12/2009   2 small tubular adenomas   CORONARY ANGIOPLASTY WITH STENT PLACEMENT Left 2002   s/p MI x 2 with subsequent PCI x 2 ; stents (unknown type) placed to the p-mRCA, [name], and [name].   CORONARY ARTERY BYPASS GRAFT  09/2000   DRAIN REMOVAL Right 08/10/2018   Procedure: DRAIN REMOVAL;  Surgeon: [name], MD;  Location: [facility];  Service: General;  Laterality: Right;   ECTOPIC PREGNANCY SURGERY     ELECTROMAGNETIC NAVIGATION BROCHOSCOPY N/A 10/06/2015   Procedure: ELECTROMAGNETIC NAVIGATION BRONCHOSCOPY;  Surgeon: Flora Lipps, MD;  Location: [facility];  Service: Cardiopulmonary;  Laterality: N/A;   ENDOBRONCHIAL ULTRASOUND N/A 10/06/2015   Procedure: ENDOBRONCHIAL ULTRASOUND;  Surgeon: Flora Lipps, MD;  Location: [facility];  Service: Cardiopulmonary;  Laterality: N/A;   EYE SURGERY     FLEXIBLE BRONCHOSCOPY Right 07/11/2018   Procedure: FLEXIBLE BRONCHOSCOPY;  Surgeon: [name], MD;  Location: [facility];  Service: Thoracic;  Laterality: Right;   KYPHOPLASTY N/A 09/28/2016   Procedure: KYPHOPLASTY;  Surgeon: Hessie Knows, MD;  Location: [facility];  Service: Orthopedics;  Laterality: N/A;   KYPHOPLASTY N/A 02/20/2018   Procedure: [id number];  Surgeon: Hessie Knows, MD;  Location: [facility];  Service: Orthopedics;  Laterality: N/A;   PACEMAKER INSERTION  03/2012   REVERSE SHOULDER ARTHROPLASTY Right 06/02/2021   Procedure: REVERSE SHOULDER ARTHROPLASTY;  Surgeon: Corky Mull, MD;  Location: [facility];  Service: Orthopedics;  Laterality: Right;   SVT ABLATION N/A 07/27/2013   Procedure: SVT ABLATION; Location: [facility]; Surgeon: Rolla Etienne, MD   thorocentesis  12/24/2016   VAGINAL HYSTERECTOMY     partial - left ovary remains   VIDEO ASSISTED THORACOSCOPY Right 07/11/2018   Procedure: VIDEO ASSISTED  THORACOSCOPY;  Surgeon: [name], MD;  Location: [facility];  Service: Thoracic;  Laterality: Right;   VIDEO ASSISTED THORACOSCOPY (VATS) W/TALC PLEUADESIS Right 07/11/2018   Procedure: THORACOTOMY PLEURAL BIOPSY WITH TALC PLEURODESIS;  Surgeon: [name], MD;  Location: [facility];  Service: Thoracic;  Laterality: Right;  pleural biopsy     Current Meds  Medication Sig   acetaminophen (TYLENOL) 500 MG tablet Take 500-1,000 mg by mouth every 4 (four) hours as needed for mild pain or moderate pain.   apixaban (ELIQUIS) 5 MG  TABS tablet TAKE (1) TABLET BY MOUTH TWICE DAILY   budesonide (PULMICORT) 0.5 MG/2ML nebulizer solution INHALE 2 MILLILITERS BY NEBULIZER 2 TIMES DAILY   cetirizine (ZYRTEC) 10 MG tablet TAKE (1) TABLET BY MOUTH EVERY DAY   fluticasone (FLONASE) 50 MCG/ACT nasal spray Place 2 sprays into both nostrils daily.   furosemide (LASIX) 20 MG tablet Take 1 tablet (20 mg total) by mouth 2 (two) times daily. TAKE 1 TABLET BY MOUTH TWICE DAILY, ALTERNATING WITH 1 EXTRA TABLET AS NEEDED when weight up by 3 lbs   ipratropium-albuterol (DUONEB) 0.5-2.5 (3) MG/3ML SOLN TAKE 3 MLS BY NEBULIZATION EVERY 6 HOURSAS NEEDED FOR WHEEZING OR SHORTNESS OF BREATH   levothyroxine (SYNTHROID) 88 MCG tablet TAKE (1) TABLET BY MOUTH EVERY DAY BEFORE BREAKFAST   metoprolol succinate (TOPROL-XL) 50 MG 24 hr tablet Take 1 tablet (50 mg total) by mouth daily. Take with or immediately following a meal.   Multiple Vitamins-Minerals (CENTRUM SILVER PO) Take 1 tablet by mouth daily.   OXYGEN Inhale 3 L into the lungs.    potassium chloride SA (KLOR-CON M) 20 MEQ tablet Take 1 tablet (20 mEq total) by mouth daily.   rosuvastatin (CRESTOR) 5 MG tablet TAKE ONE (1) TABLET BY MOUTH ONCE DAILY (Patient taking differently: Take 5 mg by mouth at bedtime.)   vitamin B-12 (CYANOCOBALAMIN) 1000 MCG tablet Take 1,000 mcg by mouth daily.   Vitamins A & D (VITAMIN A & D) ointment Apply 1 application topically as needed  (irritation).   YUPELRI 175 MCG/3ML nebulizer solution Use one vial in nebulizer once daily. Do not mix with other nebulized medications.     Allergies:   Cefdinir, Lovenox [enoxaparin sodium], and Meperidine   Social History   Tobacco Use   Smoking status: Former    Packs/day: 1.00    Years: 40.00    Pack years: 40.00    Types: Cigarettes    Quit date: 2001    Years since quitting: 22.0   Smokeless tobacco: Never   Tobacco comments:    quit smoking in 08/28/2000. Smoking cessation materials not required  Vaping Use   Vaping Use: Never used  Substance Use Topics   Alcohol use: Yes    Alcohol/week: 10.0 standard drinks    Types: 10 Glasses of wine per week    Comment: 1 glass of wine per day   Drug use: No     Family Hx: The patient's family history includes COPD in her brother, mother, and sister; Colon cancer in her brother; Hypertension in her brother and sister; Lung disease in her father; Stroke in her maternal grandmother. There is no history of Heart attack or Breast cancer.  ROS:   Please see the history of present illness.    Review of Systems  Constitutional: Negative.   HENT: Negative.    Respiratory: Negative.    Cardiovascular: Negative.   Gastrointestinal: Negative.   Musculoskeletal: Negative.        Gait instability, leg weakness  Neurological: Negative.   Psychiatric/Behavioral: Negative.    All other systems reviewed and are negative.   Labs/Other Tests and Data Reviewed:    Recent Labs: 06/23/2021: ALT 8 06/24/2021: Magnesium 1.9 06/26/2021: B Natriuretic Peptide 644.6 06/27/2021: Hemoglobin 11.3; Platelets 216 07/07/2021: BUN 25; Creatinine, Ser 1.02; Potassium 4.6; Sodium 139   Recent Lipid Panel Lab Results  Component Value Date/Time   CHOL 131 08/25/2017 09:37 AM   TRIG 159 (H) 08/25/2017 09:37 AM   HDL 36 (L) 08/25/2017 09:37  AM   CHOLHDL 3.6 08/25/2017 09:37 AM   LDLCALC 63 08/25/2017 09:37 AM    Wt Readings from Last 3  Encounters:  09/04/21 137 lb 6 oz (62.3 kg)  08/05/21 140 lb 8 oz (63.7 kg)  07/14/21 140 lb 8 oz (63.7 kg)     Exam:    Vital Signs: Vital signs may also be detailed in the HPI BP 100/60 (BP Location: Left Arm, Patient Position: Sitting, Cuff Size: Normal)    Pulse 90    Ht _0  (1.676 m)    Wt 137 lb 6 oz (62.3 kg)    SpO2 94% Comment: 2 Liters of oxygen   BMI 22.17 kg/m   Constitutional:  oriented to person, place, and time. No distress.  HENT:  Head: Grossly normal Eyes:  no discharge. No scleral icterus.  Neck: No JVD, no carotid bruits  Cardiovascular: Regular rate and rhythm, no murmurs appreciated Pulmonary/Chest: Coarse breath sounds bilaterally, Rales at the bases Abdominal: Soft.  no distension.  no tenderness.  Musculoskeletal: Normal range of motion Neurological:  normal muscle tone. Coordination normal. No atrophy Skin: Skin warm and dry Psychiatric: normal affect, pleasant   ASSESSMENT & PLAN:    Chronic combined systolic and diastolic CHF (congestive heart failure) ([place]) - Plan: EKG 12-Lead  Typical atrial flutter (HCC) - Plan: EKG 12-Lead  Essential hypertension  Mitral valve insufficiency, unspecified etiology   Atrial flutter Followed by [facility] EP Controlled ventricular rate Toprol-XL 50 twice daily Denies orthostasis, Entresto held, Took her self off midodrine it would appear , or medication confusion On anticoagulation, gait instability of concern  Pulmonary hypertension Moderate elevated heart pressures on echo July 2021 Again confirmed echo October 2022 moderately elevated right heart pressures with dilated IVC Taking Lasix twice daily, asymptomatic   Paroxysmal atrial fibrillation (HCC) -atrial flutter Previous ablation at [facility], followed by EP On anticoagulation Flutter noted again today Asymptomatic  Hyperlipidemia - Cholesterol is at goal on the current lipid regimen. No changes to the medications were made.  Chronic diastolic  CHF Continue Lasix twice a day Stable BMP   DM type 2, goal A1c below 7 Medically managed   COPD exacerbation ([place] Prior history of acute on chronic bronchitis Previously treated with prednisone, doxycycline Required repeat prednisone and second antibiotic with improved symptoms back in October 2021  Chronic back pain Management per orthopedics    Total encounter time more than 25 minutes  Greater than 50% was spent in counseling and coordination of care with the patient   Signed, Ida Rogue, MD  09/04/2021 2:58 PM    [facility] [address], [place], [place] [address]

## 2021-09-04 NOTE — Patient Instructions (Signed)
Medication Instructions:  No changes  If you need a refill on your cardiac medications before your next appointment, please call your pharmacy.    Lab work: No new labs needed   Testing/Procedures: No new testing needed   Follow-Up: At [facility], you and your health needs are our priority.  As part of our continuing mission to provide you with exceptional heart care, we have created designated Provider Care Teams.  These Care Teams include your primary Cardiologist (physician) and Advanced Practice Providers (APPs -  Physician Assistants and Nurse Practitioners) who all work together to provide you with the care you need, when you need it.  You will need a follow up appointment in 6 months  Providers on your designated Care Team:   Christopher Berge, NP Ryan Dunn, PA-C Cadence Furth, PA-C  COVID-19 Vaccine Information can be found at: [email or web address] For questions related to vaccine distribution or appointments, please email [email or web address] or call [phone].   

## 2021-09-22 ENCOUNTER — Inpatient Hospital Stay: Payer: Medicare Other | Attending: Internal Medicine | Admitting: Internal Medicine

## 2021-09-22 ENCOUNTER — Other Ambulatory Visit: Payer: Self-pay

## 2021-09-22 DIAGNOSIS — I130 Hypertensive heart and chronic kidney disease with heart failure and stage 1 through stage 4 chronic kidney disease, or unspecified chronic kidney disease: Secondary | ICD-10-CM | POA: Insufficient documentation

## 2021-09-22 DIAGNOSIS — Z7901 Long term (current) use of anticoagulants: Secondary | ICD-10-CM | POA: Insufficient documentation

## 2021-09-22 DIAGNOSIS — N183 Chronic kidney disease, stage 3 unspecified: Secondary | ICD-10-CM | POA: Insufficient documentation

## 2021-09-22 DIAGNOSIS — Z87891 Personal history of nicotine dependence: Secondary | ICD-10-CM | POA: Diagnosis not present

## 2021-09-22 DIAGNOSIS — Z79899 Other long term (current) drug therapy: Secondary | ICD-10-CM | POA: Insufficient documentation

## 2021-09-22 DIAGNOSIS — E1122 Type 2 diabetes mellitus with diabetic chronic kidney disease: Secondary | ICD-10-CM | POA: Insufficient documentation

## 2021-09-22 DIAGNOSIS — E876 Hypokalemia: Secondary | ICD-10-CM | POA: Insufficient documentation

## 2021-09-22 DIAGNOSIS — C3401 Malignant neoplasm of right main bronchus: Secondary | ICD-10-CM | POA: Insufficient documentation

## 2021-09-22 DIAGNOSIS — I4891 Unspecified atrial fibrillation: Secondary | ICD-10-CM | POA: Insufficient documentation

## 2021-09-22 DIAGNOSIS — D649 Anemia, unspecified: Secondary | ICD-10-CM | POA: Diagnosis not present

## 2021-09-22 NOTE — Progress Notes (Signed)
Patient here for oncology follow-up appointment, concerns of fatigue and chronic SOB

## 2021-09-22 NOTE — Assessment & Plan Note (Addendum)
#   Stage III squamous cell lung cancer s/p chemo-RT- 2016; Recurrent pleural effusion [see discussion below]- October, 2022- CT scan- stable consolidation right upper lung;.  Clinically stable; no obvious recurrence of disease.  Continue surveillance.  Clinically less likely to have any progressive malignancy almost 6 years post chemoradiation.  #Recurrent pleural effusion-status post pleural biopsy negative for malignancy status post pleurodesis.  Pleurx cath catheter explanted on December 12.  Clinically stable.  Continue follow-up with cardiology/pulmonary.  #Chronic respiratory failure-Home O2 2-3 L [Dr.Kasa]; multifactorial CHF COPD-stable  #Mild anemia hemoglobin 10.5-no obvious iron deficiency suspected.  If worse would recommend further work-up.   #Back pain improving s/p kyphoplasty-stable  # CKD-III creatinine 1.37 stable. sec to diuretics.  Stable  # Prosthetic valve/ A. fib on Eliquis-stable  #Hypokalemia-on potassium-s stable  # DISPOSITION:  # follow up in 6 months-MD/labs- cbc/bmp/iron studies/fereritin- - Dr.B

## 2021-09-22 NOTE — Progress Notes (Signed)
[facility] OFFICE PROGRESS NOTE  Patient Care Team: Glean Hess, MD as PCP - General (Internal Medicine) Minna Merritts, MD as PCP - Cardiology (Cardiology) Cammie Sickle, MD as Consulting Physician (Internal Medicine) Alisa Graff, FNP as Nurse Practitioner (Cardiology) Flora Lipps, MD as Consulting Physician (Pulmonary Disease) Marvia Pickles, MD as Consulting Physician (Cardiology) Poggi, Marshall Cork, MD as Consulting Physician (Orthopedic Surgery)   Cancer Staging  No matching staging information was found for the patient.   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 Biopsy from hilar area and lymph node station 4R was positive for squamous cell carcinoma.  Patient had compression of the right mainstem bronchus because of enlarged lymph node and a mass; [ T4 N1 M0 tumor stage IIIa ].  2. Started on radiation and chemotherapy from October 21, 2014 3. Finished 6 cycles of carboplatinum and Taxol  in March  29 th of 2016,  PET scan shows significant response  4. Started on consolidation chemotherapy.  Patient finished 2 cycles on July 2016 of carboplatin and Taxol. 5.  Atrial fibrillation diagnosis in August of 2016 on  eloquis 6.  Repeat bronchoscopy was negative for any malignancy  In February 2017.  # SEP 7th 2017- CT [facility]- 5cm hilar mass; bil Ground glass opacities.   # Radiation Pneumonitis [Dr.Mungal] on Prednisone  # Recurrent plueral effusion-[NOV 2019 ]s/p pleural Bx/ pleurex cath [Dr.Oaks]; NEGATIVE for malignancy; Pleurx catheter explanted on December 12th 2019  # May 1st 2018- F one- No targettable mutations; Int-TMB; ? 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  Accompanied her grandson.  Jenny Cooper 86 y.o.  female pleasant patient above history of stage III squamous cell lung cancer/CHF COPD/recurrent right-sided pleural effusion is here for follow-up.  Patient was last admitted to hospital in October 2022 for respiratory failure.  CT scan at that time showed no evidence of any progressive lung malignancy.  Denies any worsening shortness of breath or cough.  Denies any swelling in the legs.  No nausea no vomiting.  She continues been oxygen. .  Review of Systems  Constitutional:  Positive for malaise/fatigue. Negative for chills, diaphoresis and fever.  HENT:  Negative for nosebleeds and sore throat.   Eyes:  Negative for double vision.  Respiratory:  Positive for cough and shortness of breath. Negative for hemoptysis, sputum production and wheezing.   Cardiovascular:  Negative for chest pain, palpitations, orthopnea and leg swelling.  Gastrointestinal:  Negative for abdominal pain, blood in stool, constipation, diarrhea, heartburn, melena, nausea and vomiting.  Genitourinary:  Negative for dysuria, frequency and urgency.  Musculoskeletal:  Positive for back pain and joint pain.  Skin: Negative.  Negative for itching and rash.  Neurological:  Negative for dizziness, tingling, focal weakness, weakness and headaches.  Endo/Heme/Allergies:  Does not bruise/bleed easily.  Psychiatric/Behavioral:  Negative for depression. The patient is not nervous/anxious and does not have insomnia.      PAST MEDICAL HISTORY :  Past Medical History:  Diagnosis Date   Acute midline low back pain without sciatica 08/13/2016   Acute on chronic respiratory failure with hypoxia (HCC)  08/21/2018   Acute respiratory failure with hypoxia (HCC) 12/11/2017   Allergy    Aortic atherosclerosis (HCC)    Atypical atrial flutter ([place])    a.) s/p  ablation 07/27/2013 and 05/10/2016   Balance problem    CAD (coronary artery disease)    a. s/p MI x 2 in 2002 s/p PCI x 2 in 2002; b. s/p 2v CABG 2002; c. stress echo 07/2004 w/ evi of pos & inf infarct & no evi of ischemia; d. 4/08 dipyridamole scan w/ multiple areas of infarct, no ischemia, EF 49%; e. cath 04/28/15 3v CAD, med Rx rec, no targets for revasc, LM lum irregs, pLAD 30%, 100%, ost-pLCx 60%, [name] 99%, OM2 100%, p-mRCA 90%, [name] 100% L-R collats, VG-mLAD irregs, VG-OM2 oc   Chronic anticoagulation    a.) Apixaban   Chronic systolic CHF (congestive heart failure) ([place])    a. echo 03/2015: EF 30-35%, sev ant/inf/pos HK, in mild to mod MR   Closed fracture of right proximal humerus 2/87/8676   Complication of anesthesia    a.) postoperative apnea. b.) postoperative hypoxia.   Compressed spine fracture ([place]) 08/06/2016   a.) L2, T11, T12   COPD (chronic obstructive pulmonary disease) (HCC)    Deep vein thrombosis (DVT) of left lower extremity ([place]) 12/2001   Fracture of multiple pubic rami, right, closed, initial encounter ([place]) 05/27/2016   a.) RIGHT superior and inferior pubic rami fractures s/p mechanical fall.   GERD (gastroesophageal reflux disease)    HLD (hyperlipidemia)    HTN (hypertension)    Hypothyroidism    Ischemic cardiomyopathy    Mitral regurgitation    a. s/p mitral ring placement 09/2000; b. echo 09/2010: EF 50%, inf HK, post AK, mild MR, prosthetic mitral valve ring w/ peak gradient of 10 mmHg; b. echo 2/13: EF 50%, mild MR/TR      Myocardial infarction ([place]) 2002   a.) x 2 in 2002; PCI with stents (unknown type) placed to the p-mRCA, [name], and [name].   Neuropathy    Osteoarthritis    Osteoporosis    Oxygen dependent    a.) 3 L/[place]   Pacemaker    a. MDT 2002; b. generator replacement 2013; c. followed by Dr. Omelia Blackwater, MD   PAF (paroxysmal atrial fibrillation) [facility])    a.) s/p DCCV on 04/08/2017. b.) on apixaban   Personal history of chemotherapy     Personal history of radiation therapy    Pneumonia    S/P CABG x 2 2002   a.) SVG-LAD, SVG-OM   Squamous cell carcinoma of right lung ([place]) 01/03/2015   a.) stage IIIa. b.) s/p concurrent chemotherapy (carboplatin + paclitaxel) and XRT (IMRT 6000 cGy)   SVT (supraventricular tachycardia) (HCC)    Type II diabetes mellitus with complication ([place]) 72/04/4708    PAST SURGICAL HISTORY :   Past Surgical History:  Procedure Laterality Date   APPENDECTOMY     CARDIAC CATHETERIZATION N/A 04/28/2015   Procedure: Left Heart Cath and Coronary Angiography;  Surgeon: Wellington Hampshire, MD;  Location: [facility];  Service: Cardiovascular;  Laterality: N/A;   CARDIAC ELECTROPHYSIOLOGY STUDY AND ABLATION N/A 05/10/2016   Procedure(s): INTRACARDIAC ELECTROPHYSIOLOGIC 3D MAPPING, STIMULATION PACING HEART, ICAR CATHETER ABLATION ARRHYTHMIA ADD ON, ABLATE L/R ATRIAL FIBRIL W/ISOLATED PULM VEIN, INTRACARD ECHO, THER/DX INTERVENT; Location: [facility]; Surgeon: Norm Salt, MD   CARDIOVERSION N/A 04/08/2017   Procedure: CARDIOVERSION;  Surgeon: Wellington Hampshire, MD;  Location: [facility];  Service: Cardiovascular;  Laterality: N/A;  CHEST TUBE INSERTION N/A 07/11/2018   Procedure: JSEGBT CATH INSERTION;  Surgeon: [name], MD;  Location: [facility];  Service: Thoracic;  Laterality: N/A;   COLONOSCOPY  12/2009   2 small tubular adenomas   CORONARY ANGIOPLASTY WITH STENT PLACEMENT Left 2002   s/p MI x 2 with subsequent PCI x 2 ; stents (unknown type) placed to the p-mRCA, [name], and [name].   CORONARY ARTERY BYPASS GRAFT  09/2000   DRAIN REMOVAL Right 08/10/2018   Procedure: DRAIN REMOVAL;  Surgeon: [name], MD;  Location: [facility];  Service: General;  Laterality: Right;   ECTOPIC PREGNANCY SURGERY     ELECTROMAGNETIC NAVIGATION BROCHOSCOPY N/A 10/06/2015   Procedure: ELECTROMAGNETIC NAVIGATION BRONCHOSCOPY;  Surgeon: Flora Lipps, MD;  Location: [facility];  Service: Cardiopulmonary;  Laterality:  N/A;   ENDOBRONCHIAL ULTRASOUND N/A 10/06/2015   Procedure: ENDOBRONCHIAL ULTRASOUND;  Surgeon: Flora Lipps, MD;  Location: [facility];  Service: Cardiopulmonary;  Laterality: N/A;   EYE SURGERY     FLEXIBLE BRONCHOSCOPY Right 07/11/2018   Procedure: FLEXIBLE BRONCHOSCOPY;  Surgeon: [name], MD;  Location: [facility];  Service: Thoracic;  Laterality: Right;   KYPHOPLASTY N/A 09/28/2016   Procedure: KYPHOPLASTY;  Surgeon: Hessie Knows, MD;  Location: [facility];  Service: Orthopedics;  Laterality: N/A;   KYPHOPLASTY N/A 02/20/2018   Procedure: [id number];  Surgeon: Hessie Knows, MD;  Location: [facility];  Service: Orthopedics;  Laterality: N/A;   PACEMAKER INSERTION  03/2012   REVERSE SHOULDER ARTHROPLASTY Right 06/02/2021   Procedure: REVERSE SHOULDER ARTHROPLASTY;  Surgeon: Corky Mull, MD;  Location: [facility];  Service: Orthopedics;  Laterality: Right;   SVT ABLATION N/A 07/27/2013   Procedure: SVT ABLATION; Location: [facility]; Surgeon: Rolla Etienne, MD   thorocentesis  12/24/2016   VAGINAL HYSTERECTOMY     partial - left ovary remains   VIDEO ASSISTED THORACOSCOPY Right 07/11/2018   Procedure: VIDEO ASSISTED THORACOSCOPY;  Surgeon: [name], MD;  Location: [facility];  Service: Thoracic;  Laterality: Right;   VIDEO ASSISTED THORACOSCOPY (VATS) W/TALC PLEUADESIS Right 07/11/2018   Procedure: THORACOTOMY PLEURAL BIOPSY WITH TALC PLEURODESIS;  Surgeon: [name], MD;  Location: [facility];  Service: Thoracic;  Laterality: Right;  pleural biopsy    FAMILY HISTORY :   Family History  Problem Relation Age of Onset   COPD Mother        sister, and brother   Lung disease Father    Stroke Maternal Grandmother    Hypertension Sister    COPD Sister    Hypertension Brother    COPD Brother    Colon cancer Brother    Heart attack Neg Hx    Breast cancer Neg Hx     SOCIAL HISTORY:   Social History   Tobacco Use   Smoking status: Former    Packs/day: 1.00    Years: 40.00     Pack years: 40.00    Types: Cigarettes    Quit date: 2001    Years since quitting: 22.1   Smokeless tobacco: Never   Tobacco comments:    quit smoking in 08/28/2000. Smoking cessation materials not required  Vaping Use   Vaping Use: Never used  Substance Use Topics   Alcohol use: Yes    Alcohol/week: 10.0 standard drinks    Types: 10 Glasses of wine per week    Comment: 1 glass of wine per day   Drug use: No    ALLERGIES:  is allergic to cefdinir, lovenox [enoxaparin sodium], and meperidine.  MEDICATIONS:  Current Outpatient Medications  Medication Sig Dispense Refill   acetaminophen (TYLENOL) 500 MG tablet Take 500-1,000 mg by mouth every 4 (four) hours as needed for mild pain or moderate pain.     apixaban (ELIQUIS) 5 MG TABS tablet TAKE (1) TABLET BY MOUTH TWICE DAILY 60 tablet 5   budesonide (PULMICORT) 0.5 MG/2ML nebulizer solution INHALE 2 MILLILITERS BY NEBULIZER 2 TIMES DAILY 360 mL 5   cetirizine (ZYRTEC) 10 MG tablet TAKE (1) TABLET BY MOUTH EVERY DAY 30 tablet 12   fluticasone (FLONASE) 50 MCG/ACT nasal spray Place 2 sprays into both nostrils daily. 16 g 5   furosemide (LASIX) 20 MG tablet Take 1 tablet (20 mg total) by mouth 2 (two) times daily. TAKE 1 TABLET BY MOUTH TWICE DAILY, ALTERNATING WITH 1 EXTRA TABLET AS NEEDED when weight up by 3 lbs 180 tablet 3   ipratropium-albuterol (DUONEB) 0.5-2.5 (3) MG/3ML SOLN TAKE 3 MLS BY NEBULIZATION EVERY 6 HOURSAS NEEDED FOR WHEEZING OR SHORTNESS OF BREATH 360 mL 1   levothyroxine (SYNTHROID) 88 MCG tablet TAKE (1) TABLET BY MOUTH EVERY DAY BEFORE BREAKFAST 30 tablet 2   metoprolol succinate (TOPROL-XL) 50 MG 24 hr tablet Take 1 tablet (50 mg total) by mouth in the morning and at bedtime. 180 tablet 3   Multiple Vitamins-Minerals (CENTRUM SILVER PO) Take 1 tablet by mouth daily.     OXYGEN Inhale 3 L into the lungs.      rosuvastatin (CRESTOR) 5 MG tablet TAKE ONE (1) TABLET BY MOUTH ONCE DAILY (Patient taking differently:  Take 5 mg by mouth at bedtime.) 30 tablet 6   vitamin B-12 (CYANOCOBALAMIN) 1000 MCG tablet Take 1,000 mcg by mouth daily.     YUPELRI 175 MCG/3ML nebulizer solution Use one vial in nebulizer once daily. Do not mix with other nebulized medications. 1200 mL 5   baclofen (LIORESAL) 10 MG tablet Take 1 tablet (10 mg total) by mouth 3 (three) times daily. 30 each 0   potassium chloride SA (KLOR-CON M) 20 MEQ tablet Take 1 tablet (20 mEq total) by mouth daily. 90 tablet 3   predniSONE (DELTASONE) 10 MG tablet Take 6 on day 1and 2, 5 on day 3 and 4, 4 on day 5 and 6 , 3 on day 7 and 8, 2 on day 9 and 10 and 1 on day 11 and 12 then stop. 42 tablet 0   No current facility-administered medications for this visit.   Facility-Administered Medications Ordered in Other Visits  Medication Dose Route Frequency Provider Last Rate Last Admin   sodium chloride 0.9 % injection 10 mL  10 mL Intravenous PRN Choksi, Delorise Shiner, MD   10 mL at 02/19/15 1000   sodium chloride flush (NS) 0.9 % injection 10 mL  10 mL Intravenous PRN Cammie Sickle, MD   10 mL at 02/16/16 1048    PHYSICAL EXAMINATION: ECOG PERFORMANCE STATUS: 2 - Symptomatic, <50% confined to bed  BP 126/63 (Patient Position: Sitting)    Pulse 62    Temp (!) 96 F (35.6 C) (Tympanic)    Resp 18    Wt 141 lb (64 kg)    SpO2 96% Comment: 2L   BMI 22.76 kg/m   Filed Weights   09/22/21 1312  Weight: 141 lb (64 kg)    Physical Exam Constitutional:      Comments: [place] Caucasian female patient.  She is accompanied by her daughter.  She is walking of her own.  On 2 to 3  L of oxygen.  HENT:     Head: Normocephalic and atraumatic.     Mouth/Throat:     Pharynx: No oropharyngeal exudate.  Eyes:     Pupils: Pupils are equal, round, and reactive to light.  Cardiovascular:     Rate and Rhythm: Normal rate and regular rhythm.  Pulmonary:     Effort: No respiratory distress.     Breath sounds: No wheezing.  Abdominal:     General: Bowel  sounds are normal. There is no distension.     Palpations: Abdomen is soft. There is no mass.     Tenderness: There is no abdominal tenderness. There is no guarding or rebound.  Musculoskeletal:        General: No tenderness. Normal range of motion.     Cervical back: Normal range of motion and neck supple.  Skin:    General: Skin is warm.  Neurological:     Mental Status: She is alert and oriented to person, place, and time.  Psychiatric:        Mood and Affect: Affect normal.       LABORATORY DATA:  I have reviewed the data as listed    Component Value Date/Time   NA 141 10/07/2021 1408   NA 136 12/24/2014 1457   K 4.2 10/07/2021 1408   K 3.6 12/24/2014 1457   CL 98 10/07/2021 1408   CL 98 (L) 12/24/2014 1457   CO2 30 (H) 10/07/2021 1408   CO2 32 12/24/2014 1457   GLUCOSE 95 10/07/2021 1408   GLUCOSE 99 06/27/2021 0552   GLUCOSE 107 (H) 12/24/2014 1457   BUN 22 10/07/2021 1408   BUN 17 12/24/2014 1457   CREATININE 1.09 (H) 10/07/2021 1408   CREATININE 1.00 12/24/2014 1457   CALCIUM 10.1 10/07/2021 1408   CALCIUM 9.5 12/24/2014 1457   PROT 8.0 10/07/2021 1408   PROT 7.4 12/24/2014 1457   ALBUMIN 4.3 10/07/2021 1408   ALBUMIN 3.9 12/24/2014 1457   AST 33 10/07/2021 1408   AST 22 12/24/2014 1457   ALT 17 10/07/2021 1408   ALT 19 12/24/2014 1457   ALKPHOS 104 10/07/2021 1408   ALKPHOS 65 12/24/2014 1457   BILITOT 0.5 10/07/2021 1408   BILITOT 0.4 12/24/2014 1457   GFRNONAA 57 (L) 06/29/2021 0504   GFRNONAA 55 (L) 12/24/2014 1457   GFRAA 33 (L) 02/11/2020 1601   GFRAA >60 12/24/2014 1457    No results found for: SPEP, UPEP  Lab Results  Component Value Date   WBC 5.5 10/07/2021   NEUTROABS 3.9 10/07/2021   HGB 12.4 10/07/2021   HCT 38.3 10/07/2021   MCV 89 10/07/2021   PLT 240 10/07/2021      Chemistry      Component Value Date/Time   NA 141 10/07/2021 1408   NA 136 12/24/2014 1457   K 4.2 10/07/2021 1408   K 3.6 12/24/2014 1457   CL 98  10/07/2021 1408   CL 98 (L) 12/24/2014 1457   CO2 30 (H) 10/07/2021 1408   CO2 32 12/24/2014 1457   BUN 22 10/07/2021 1408   BUN 17 12/24/2014 1457   CREATININE 1.09 (H) 10/07/2021 1408   CREATININE 1.00 12/24/2014 1457   GLU 94 01/22/2020 0000      Component Value Date/Time   CALCIUM 10.1 10/07/2021 1408   CALCIUM 9.5 12/24/2014 1457   ALKPHOS 104 10/07/2021 1408   ALKPHOS 65 12/24/2014 1457   AST 33 10/07/2021 1408   AST 22 12/24/2014 1457  ALT 17 10/07/2021 1408   ALT 19 12/24/2014 1457   BILITOT 0.5 10/07/2021 1408   BILITOT 0.4 12/24/2014 1457       RADIOGRAPHIC STUDIES: I have personally reviewed the radiological images as listed and agreed with the findings in the report. No results found.   ASSESSMENT & PLAN:  Cancer of hilus of right lung ([place]) # Stage III squamous cell lung cancer s/p chemo-RT- 2016; Recurrent pleural effusion [see discussion below]- October, 2022- CT scan- stable consolidation right upper lung;.  Clinically stable; no obvious recurrence of disease.  Continue surveillance.  Clinically less likely to have any progressive malignancy almost 6 years post chemoradiation.  #Recurrent pleural effusion-status post pleural biopsy negative for malignancy status post pleurodesis.  Pleurx cath catheter explanted on December 12.  Clinically stable.  Continue follow-up with cardiology/pulmonary.  #Chronic respiratory failure-Home O2 2-3 L [Dr.Kasa]; multifactorial CHF COPD-stable  #Mild anemia hemoglobin 10.5-no obvious iron deficiency suspected.  If worse would recommend further work-up.   #Back pain improving s/p kyphoplasty-stable  # CKD-III creatinine 1.37 stable. sec to diuretics.  Stable  # Prosthetic valve/ A. fib on Eliquis-stable  #Hypokalemia-on potassium-s stable  # DISPOSITION:  # follow up in 6 months-MD/labs- cbc/bmp/iron studies/fereritin- - Dr.B    Orders Placed This Encounter  Procedures   CBC with Differential/Platelet    Standing  Status:   Future    Standing Expiration Date:   02/26/4764   Basic metabolic panel    Standing Status:   Future    Standing Expiration Date:   09/22/2022   Iron and TIBC    Standing Status:   Future    Standing Expiration Date:   09/22/2022   Ferritin    Standing Status:   Future    Standing Expiration Date:   09/22/2022   All questions were answered. The patient knows to call the clinic with any problems, questions or concerns.      Cammie Sickle, MD 10/08/2021 7:20 PM

## 2021-10-07 ENCOUNTER — Ambulatory Visit (INDEPENDENT_AMBULATORY_CARE_PROVIDER_SITE_OTHER): Payer: Medicare Other | Admitting: Internal Medicine

## 2021-10-07 ENCOUNTER — Other Ambulatory Visit: Payer: Self-pay

## 2021-10-07 ENCOUNTER — Encounter: Payer: Self-pay | Admitting: Internal Medicine

## 2021-10-07 VITALS — BP 130/74 | HR 95 | Ht 66.0 in | Wt 138.0 lb

## 2021-10-07 DIAGNOSIS — I25709 Atherosclerosis of coronary artery bypass graft(s), unspecified, with unspecified angina pectoris: Secondary | ICD-10-CM | POA: Diagnosis not present

## 2021-10-07 DIAGNOSIS — G620 Drug-induced polyneuropathy: Secondary | ICD-10-CM

## 2021-10-07 DIAGNOSIS — Z23 Encounter for immunization: Secondary | ICD-10-CM | POA: Diagnosis not present

## 2021-10-07 DIAGNOSIS — E538 Deficiency of other specified B group vitamins: Secondary | ICD-10-CM

## 2021-10-07 DIAGNOSIS — E039 Hypothyroidism, unspecified: Secondary | ICD-10-CM | POA: Diagnosis not present

## 2021-10-07 DIAGNOSIS — I10 Essential (primary) hypertension: Secondary | ICD-10-CM | POA: Diagnosis not present

## 2021-10-07 DIAGNOSIS — M5432 Sciatica, left side: Secondary | ICD-10-CM

## 2021-10-07 DIAGNOSIS — E782 Mixed hyperlipidemia: Secondary | ICD-10-CM

## 2021-10-07 MED ORDER — PREDNISONE 10 MG PO TABS: ORAL_TABLET | ORAL | 0 refills | Status: AC

## 2021-10-07 MED ORDER — BACLOFEN 10 MG PO TABS: 10.0000 mg | ORAL_TABLET | Freq: Three times a day (TID) | ORAL | 0 refills | Status: DC

## 2021-10-07 NOTE — Progress Notes (Signed)
Date:  10/07/2021   Name:  Jenny Cooper   DOB:  31-Jan-1936   MRN:  [id number]   Chief Complaint: Leg Pain and Back Pain  Leg Pain  The incident occurred more than 1 week ago. Incident location: none. The pain is present in the left leg. The quality of the pain is described as aching, burning, cramping and shooting. The pain is at a severity of 3/10. The pain is mild. The pain has been Fluctuating since onset. Associated symptoms include an inability to bear weight, numbness and tingling. She has tried ice, heat and acetaminophen for the symptoms. The treatment provided mild relief.  Back Pain This is a recurrent problem. Associated symptoms include leg pain, numbness, tingling and weakness (in left leg). Pertinent negatives include no chest pain, fever or headaches. Risk factors include history of osteoporosis, lack of exercise and sedentary lifestyle (hx of several compression fx).  Hypertension This is a chronic problem. The problem is controlled. Associated symptoms include palpitations and shortness of breath. Pertinent negatives include no chest pain or headaches. Risk factors for coronary artery disease include dyslipidemia. Past treatments include diuretics and beta blockers. The current treatment provides moderate improvement. Hypertensive end-organ damage includes CAD/MI, CVA, heart failure and PVD.  Recent Xray with Dr. Roland Rack showed mild new comp fx L5.  She would have to go to [facility] now if she needed kyphoplasty but she declined.   At that time she just had mild back pain - the radiating pain is new. Lab Results  Component Value Date   NA 139 07/07/2021   K 4.6 07/07/2021   CO2 27 07/07/2021   GLUCOSE 114 (H) 07/07/2021   BUN 25 07/07/2021   CREATININE 1.02 (H) 07/07/2021   CALCIUM 9.9 07/07/2021   EGFR 55 (L) 07/07/2021   GFRNONAA 57 (L) 06/29/2021   Lab Results  Component Value Date   CHOL 131 08/25/2017   HDL 36 (L) 08/25/2017   LDLCALC 63 08/25/2017   TRIG 159  (H) 08/25/2017   CHOLHDL 3.6 08/25/2017   Lab Results  Component Value Date   TSH 10.04 (A) 01/22/2020   Lab Results  Component Value Date   HGBA1C 5.5 09/28/2019   Lab Results  Component Value Date   WBC 5.0 06/27/2021   HGB 11.3 (L) 06/27/2021   HCT 35.7 (L) 06/27/2021   MCV 94.7 06/27/2021   PLT 216 06/27/2021   Lab Results  Component Value Date   ALT 8 06/23/2021   AST 22 06/23/2021   ALKPHOS 82 06/23/2021   BILITOT 1.5 (H) 06/23/2021   No results found for: 25OHVITD2, 25OHVITD3, VD25OH   Review of Systems  Constitutional:  Positive for fatigue. Negative for chills, fever and unexpected weight change.  Respiratory:  Positive for shortness of breath. Negative for cough, choking, chest tightness and wheezing.   Cardiovascular:  Positive for palpitations. Negative for chest pain and leg swelling.  Musculoskeletal:  Positive for back pain, gait problem and myalgias (left calf).  Neurological:  Positive for tingling, weakness (in left leg) and numbness. Negative for dizziness and headaches.  Psychiatric/Behavioral:  Negative for dysphoric mood and sleep disturbance. The patient is not nervous/anxious.    Patient Active Problem List   Diagnosis Date Noted   CHF (congestive heart failure) ([place]) 06/25/2021   Hypokalemia 06/23/2021   Troponin level elevated 06/23/2021   Status post reverse arthroplasty of shoulder, right 06/02/2021   Age-related cataract of both eyes 04/29/2021   Squamous cell lung cancer (  [place]) 02/11/2020   Drug-induced polyneuropathy ([place]) 01/23/2020   Low vitamin B12 level 01/14/2019   Pulmonary hypertension, unspecified ([place]) 12/01/2018   Chronic respiratory failure with hypoxia ([place]) 12/01/2018   Normocytic anemia 10/17/2018   Recurrent pleural effusion on right 07/11/2018   Environmental and seasonal allergies 12/20/2017   Muscle spasms of neck 12/20/2017   Coronary artery disease of bypass graft of native heart with stable angina pectoris ([place])  04/21/2017   Chronic combined systolic and diastolic CHF (congestive heart failure) ([place]) 04/14/2017   Sinus node dysfunction ([place]) 04/06/2017   Back pain of thoracolumbar region 03/15/2017   Falls frequently 03/05/2017   Cancer of hilus of right lung ([place]) 12/14/2016   Compression fracture of L2 lumbar vertebra ([place]) 08/31/2016   Cardiomyopathy ([place]) 04/29/2016   Hx of adenomatous colonic polyps 11/17/2015   Radiation pneumonitis ([place]) 10/21/2015   Mitral regurgitation    HLD (hyperlipidemia)    Paroxysmal supraventricular tachycardia (HCC)    NSTEMI (non-ST elevated myocardial infarction) ([place])    Arteriosclerosis of coronary artery 01/11/2015   Hypothyroidism (acquired) 01/11/2015   Disorder of peripheral nervous system 10/04/2014   Cervical radiculopathy, chronic 10/04/2014   Atrial flutter ([place]) 11/16/2013   Chronic obstructive pulmonary disease ([place]) 04/24/2012   Essential hypertension 08/03/2011   Pacemaker 08/03/2011    Allergies  Allergen Reactions   Cefdinir Rash    Had hives after completing treatment - unsure if it was the cause.  Has received 1st generation cephalosporin many times (04/24/2012, 07/27/2013, 09/28/2016, 02/20/2018, 07/11/2018, 08/10/2018 without documented ADRs.   Lovenox [Enoxaparin Sodium] Itching   Meperidine Other (See Comments)    Other Reaction: pt does not like how it makes her feel Other reaction(s): Other (See Comments) Other Reaction: pt does not like how it makes her feel    Past Surgical History:  Procedure Laterality Date   APPENDECTOMY     CARDIAC CATHETERIZATION N/A 04/28/2015   Procedure: Left Heart Cath and Coronary Angiography;  Surgeon: Wellington Hampshire, MD;  Location: [facility];  Service: Cardiovascular;  Laterality: N/A;   CARDIAC ELECTROPHYSIOLOGY STUDY AND ABLATION N/A 05/10/2016   Procedure(s): INTRACARDIAC ELECTROPHYSIOLOGIC 3D MAPPING, STIMULATION PACING HEART, ICAR CATHETER ABLATION ARRHYTHMIA ADD ON, ABLATE  L/R ATRIAL FIBRIL W/ISOLATED PULM VEIN, INTRACARD ECHO, THER/DX INTERVENT; Location: [facility]; Surgeon: Norm Salt, MD   CARDIOVERSION N/A 04/08/2017   Procedure: CARDIOVERSION;  Surgeon: Wellington Hampshire, MD;  Location: [facility];  Service: Cardiovascular;  Laterality: N/A;   CHEST TUBE INSERTION N/A 07/11/2018   Procedure: KZSWFU CATH INSERTION;  Surgeon: [name], MD;  Location: [facility];  Service: Thoracic;  Laterality: N/A;   COLONOSCOPY  12/2009   2 small tubular adenomas   CORONARY ANGIOPLASTY WITH STENT PLACEMENT Left 2002   s/p MI x 2 with subsequent PCI x 2 ; stents (unknown type) placed to the p-mRCA, [name], and [name].   CORONARY ARTERY BYPASS GRAFT  09/2000   DRAIN REMOVAL Right 08/10/2018   Procedure: DRAIN REMOVAL;  Surgeon: [name], MD;  Location: [facility];  Service: General;  Laterality: Right;   ECTOPIC PREGNANCY SURGERY     ELECTROMAGNETIC NAVIGATION BROCHOSCOPY N/A 10/06/2015   Procedure: ELECTROMAGNETIC NAVIGATION BRONCHOSCOPY;  Surgeon: Flora Lipps, MD;  Location: [facility];  Service: Cardiopulmonary;  Laterality: N/A;   ENDOBRONCHIAL ULTRASOUND N/A 10/06/2015   Procedure: ENDOBRONCHIAL ULTRASOUND;  Surgeon: Flora Lipps, MD;  Location: [facility];  Service: Cardiopulmonary;  Laterality: N/A;   EYE SURGERY     FLEXIBLE BRONCHOSCOPY Right  07/11/2018   Procedure: FLEXIBLE BRONCHOSCOPY;  Surgeon: [name], MD;  Location: [facility];  Service: Thoracic;  Laterality: Right;   KYPHOPLASTY N/A 09/28/2016   Procedure: KYPHOPLASTY;  Surgeon: Hessie Knows, MD;  Location: [facility];  Service: Orthopedics;  Laterality: N/A;   KYPHOPLASTY N/A 02/20/2018   Procedure: [id number];  Surgeon: Hessie Knows, MD;  Location: [facility];  Service: Orthopedics;  Laterality: N/A;   PACEMAKER INSERTION  03/2012   REVERSE SHOULDER ARTHROPLASTY Right 06/02/2021   Procedure: REVERSE SHOULDER ARTHROPLASTY;  Surgeon: Corky Mull, MD;  Location: [facility];  Service: Orthopedics;   Laterality: Right;   SVT ABLATION N/A 07/27/2013   Procedure: SVT ABLATION; Location: [facility]; Surgeon: Rolla Etienne, MD   thorocentesis  12/24/2016   VAGINAL HYSTERECTOMY     partial - left ovary remains   VIDEO ASSISTED THORACOSCOPY Right 07/11/2018   Procedure: VIDEO ASSISTED THORACOSCOPY;  Surgeon: [name], MD;  Location: [facility];  Service: Thoracic;  Laterality: Right;   VIDEO ASSISTED THORACOSCOPY (VATS) W/TALC PLEUADESIS Right 07/11/2018   Procedure: THORACOTOMY PLEURAL BIOPSY WITH TALC PLEURODESIS;  Surgeon: [name], MD;  Location: [facility];  Service: Thoracic;  Laterality: Right;  pleural biopsy    Social History   Tobacco Use   Smoking status: Former    Packs/day: 1.00    Years: 40.00    Pack years: 40.00    Types: Cigarettes    Quit date: 2001    Years since quitting: 22.1   Smokeless tobacco: Never   Tobacco comments:    quit smoking in 08/28/2000. Smoking cessation materials not required  Vaping Use   Vaping Use: Never used  Substance Use Topics   Alcohol use: Yes    Alcohol/week: 10.0 standard drinks    Types: 10 Glasses of wine per week    Comment: 1 glass of wine per day   Drug use: No     Medication list has been reviewed and updated.  Current Meds  Medication Sig   acetaminophen (TYLENOL) 500 MG tablet Take 500-1,000 mg by mouth every 4 (four) hours as needed for mild pain or moderate pain.   apixaban (ELIQUIS) 5 MG TABS tablet TAKE (1) TABLET BY MOUTH TWICE DAILY   budesonide (PULMICORT) 0.5 MG/2ML nebulizer solution INHALE 2 MILLILITERS BY NEBULIZER 2 TIMES DAILY   cetirizine (ZYRTEC) 10 MG tablet TAKE (1) TABLET BY MOUTH EVERY DAY   fluticasone (FLONASE) 50 MCG/ACT nasal spray Place 2 sprays into both nostrils daily.   furosemide (LASIX) 20 MG tablet Take 1 tablet (20 mg total) by mouth 2 (two) times daily. TAKE 1 TABLET BY MOUTH TWICE DAILY, ALTERNATING WITH 1 EXTRA TABLET AS NEEDED when weight up by 3 lbs   ipratropium-albuterol  (DUONEB) 0.5-2.5 (3) MG/3ML SOLN TAKE 3 MLS BY NEBULIZATION EVERY 6 HOURSAS NEEDED FOR WHEEZING OR SHORTNESS OF BREATH   levothyroxine (SYNTHROID) 88 MCG tablet TAKE (1) TABLET BY MOUTH EVERY DAY BEFORE BREAKFAST   metoprolol succinate (TOPROL-XL) 50 MG 24 hr tablet Take 1 tablet (50 mg total) by mouth in the morning and at bedtime.   Multiple Vitamins-Minerals (CENTRUM SILVER PO) Take 1 tablet by mouth daily.   OXYGEN Inhale 3 L into the lungs.    potassium chloride SA (KLOR-CON M) 20 MEQ tablet Take 1 tablet (20 mEq total) by mouth daily.   rosuvastatin (CRESTOR) 5 MG tablet TAKE ONE (1) TABLET BY MOUTH ONCE DAILY (Patient taking differently: Take 5 mg by mouth at bedtime.)   vitamin B-12 (CYANOCOBALAMIN)  1000 MCG tablet Take 1,000 mcg by mouth daily.   YUPELRI 175 MCG/3ML nebulizer solution Use one vial in nebulizer once daily. Do not mix with other nebulized medications.    PHQ 2/9 Scores 07/15/2021 11/21/2020 06/02/2020 01/23/2020  PHQ - 2 Score 2 0 0 0  PHQ- 9 Score 2 0 - 0    GAD 7 : Generalized Anxiety Score 11/21/2020 01/23/2020  Nervous, Anxious, on Edge 0 0  Control/stop worrying 0 0  Worry too much - different things 0 0  Trouble relaxing 0 0  Restless 0 0  Easily annoyed or irritable 0 0  Afraid - awful might happen 0 0  Total GAD 7 Score 0 0  Anxiety Difficulty - Not difficult at all    BP Readings from Last 3 Encounters:  10/07/21 130/74  09/22/21 126/63  09/04/21 100/60    Physical Exam Vitals and nursing note reviewed.  Constitutional:      General: She is not in acute distress.    Appearance: She is well-developed.  HENT:     Head: Normocephalic and atraumatic.  Cardiovascular:     Rate and Rhythm: Normal rate. Rhythm irregular.  Pulmonary:     Effort: Pulmonary effort is normal. No respiratory distress.     Breath sounds: Decreased breath sounds present. No wheezing.  Musculoskeletal:     Cervical back: Normal range of motion.     Lumbar back: Tenderness  (mild lower spine) and bony tenderness present. Positive left straight leg raise test. Negative right straight leg raise test.  Lymphadenopathy:     Cervical: No cervical adenopathy.  Skin:    General: Skin is warm and dry.     Findings: No rash.  Neurological:     Mental Status: She is alert and oriented to person, place, and time.  Psychiatric:        Mood and Affect: Mood normal.        Behavior: Behavior normal.    Wt Readings from Last 3 Encounters:  10/07/21 138 lb (62.6 kg)  09/22/21 141 lb (64 kg)  09/04/21 137 lb 6 oz (62.3 kg)    BP 130/74    Pulse 95    Ht _0  (1.676 m)    Wt 138 lb (62.6 kg)    SpO2 94% Comment: using oxygen when taken   BMI 22.27 kg/m   Assessment and Plan: 1. Sciatica of left side Will treat with activity moderation, steroid taper and muscle relaxants Continue tylenol and use heat or ice whichever helps. Doubt that L5 slight wedge comp fx is contributing but if pain worsens, will need further imaging - predniSONE (DELTASONE) 10 MG tablet; Take 6 on day 1and 2, 5 on day 3 and 4, 4 on day 5 and 6 , 3 on day 7 and 8, 2 on day 9 and 10 and 1 on day 11 and 12 then stop.  Dispense: 42 tablet; Refill: 0 - baclofen (LIORESAL) 10 MG tablet; Take 1 tablet (10 mg total) by mouth 3 (three) times daily.  Dispense: 30 each; Refill: 0  2. Essential hypertension Clinically stable exam with well controlled BP. Tolerating medications without side effects at this time. Pt to continue current regimen and low sodium diet; benefits of regular exercise as able discussed. - CBC with Differential/Platelet - Comprehensive metabolic panel  3. Hypothyroidism (acquired) Followed by Endo  4. Drug-induced polyneuropathy (HCC) Stable, unchanged - Magnesium  5. Low vitamin B12 level Continue oral supplement; verify normal serum levels -  Vitamin B12  6. Mixed hyperlipidemia - Lipid panel  7. Chronic obstructive pulmonary disease with (acute) exacerbation  (HCC) Dependent on O2.  Recent oncology visit showed no evidence of recurrent lung cancer. Symptoms are stable currently.  8. Coronary artery disease involving coronary bypass graft of native heart with angina pectoris ([place]) Stable without recent exacerbation   Partially dictated using Editor, commissioning. Any errors are unintentional.  Halina Maidens, MD [facility]  10/07/2021

## 2021-10-08 LAB — COMPREHENSIVE METABOLIC PANEL
ALT: 17 IU/L (ref 0–32)
AST: 33 IU/L (ref 0–40)
Albumin/Globulin Ratio: 1.2 (ref 1.2–2.2)
Albumin: 4.3 g/dL (ref 3.6–4.6)
Alkaline Phosphatase: 104 IU/L (ref 44–121)
BUN/Creatinine Ratio: 20 (ref 12–28)
BUN: 22 mg/dL (ref 8–27)
Bilirubin Total: 0.5 mg/dL (ref 0.0–1.2)
CO2: 30 mmol/L — ABNORMAL HIGH (ref 20–29)
Calcium: 10.1 mg/dL (ref 8.7–10.3)
Chloride: 98 mmol/L (ref 96–106)
Creatinine, Ser: 1.09 mg/dL — ABNORMAL HIGH (ref 0.57–1.00)
Globulin, Total: 3.7 g/dL (ref 1.5–4.5)
Glucose: 95 mg/dL (ref 70–99)
Potassium: 4.2 mmol/L (ref 3.5–5.2)
Sodium: 141 mmol/L (ref 134–144)
Total Protein: 8 g/dL (ref 6.0–8.5)
eGFR: 50 mL/min/{1.73_m2} — ABNORMAL LOW (ref >59)

## 2021-10-08 LAB — CBC WITH DIFFERENTIAL/PLATELET
Basophils Absolute: 0 10*3/uL (ref 0.0–0.2)
Basos: 0 %
EOS (ABSOLUTE): 0 10*3/uL (ref 0.0–0.4)
Eos: 0 %
Hematocrit: 38.3 % (ref 34.0–46.6)
Hemoglobin: 12.4 g/dL (ref 11.1–15.9)
Immature Grans (Abs): 0 10*3/uL (ref 0.0–0.1)
Immature Granulocytes: 0 %
Lymphocytes Absolute: 1.2 10*3/uL (ref 0.7–3.1)
Lymphs: 22 %
MCH: 28.9 pg (ref 26.6–33.0)
MCHC: 32.4 g/dL (ref 31.5–35.7)
MCV: 89 fL (ref 79–97)
Monocytes Absolute: 0.4 10*3/uL (ref 0.1–0.9)
Monocytes: 7 %
Neutrophils Absolute: 3.9 10*3/uL (ref 1.4–7.0)
Neutrophils: 71 %
Platelets: 240 10*3/uL (ref 150–450)
RBC: 4.29 x10E6/uL (ref 3.77–5.28)
RDW: 14.9 % (ref 11.7–15.4)
WBC: 5.5 10*3/uL (ref 3.4–10.8)

## 2021-10-08 LAB — MAGNESIUM: Magnesium: 2.3 mg/dL (ref 1.6–2.3)

## 2021-10-08 LAB — LIPID PANEL
Chol/HDL Ratio: 2.5 ratio (ref 0.0–4.4)
Cholesterol, Total: 116 mg/dL (ref 100–199)
HDL: 46 mg/dL (ref >39)
LDL Chol Calc (NIH): 54 mg/dL (ref 0–99)
Triglycerides: 81 mg/dL (ref 0–149)
VLDL Cholesterol Cal: 16 mg/dL (ref 5–40)

## 2021-10-08 LAB — VITAMIN B12: Vitamin B-12: 1787 pg/mL — ABNORMAL HIGH (ref 232–1245)

## 2021-10-12 NOTE — Progress Notes (Signed)
Cooper ID: Jenny Cooper, female    DOB: 04/27/1936, 86 y.o.   MRN: [id number]  HPI  Jenny Cooper is a 86 y/o female with a history of atrial fibrillation (cardioversion), MI, lung cancer, hypothyroidism, HTN, hyperlipidemia, blood clots, GERD, COPD, spinal compression, previous tobacco use and chronic heart failure.   Echo report from 06/24/21 reviewed and showed an EF of 40-45% and moderately elevated PA pressure of 56.0. Echo done 04/06/17 reviewed and shows an EF of 30-35% along with trivial AR, moderate MR and moderately elevated PA pressure of 50 mm Hg.   Cardiac catheterization done 04/28/15 shows severe underlying three-vessel CAD with occluded SVG likely to OM. No good options for revascularization. Native RCA is occluded with left-to-right collaterals. Recommended optimizing medical therapy.   Admitted 06/23/21 due to shortness of breath and pedal edema. Hypokalemia corrected. Initially given IV lasix with transition to oral diuretics. Cardiology consult obtained. Entresto held due to hypotension. Acute bronchitis treated with zpack. Elevated troponin thought to be due to demand ischemia. Discharged after 6 days.   She presents today for a follow-up visit with a chief complaint of minimal fatigue upon moderate exertion. She has associated shortness of breath, abdominal distention and back/sciatica pain along with this. She denies any difficulty sleeping (normally), dizziness, cough, chest pain, pedal edema, palpitations or weight gain.   Biggest issue is of her left leg sciatica pain along with low back pain. She has recently started prednisone taper and says that her sciatica pain had improved. Difficulty sleeping due to prednisone. Forgot to take her diuretic last night and says that is why her abdomen is distended today.   Past Medical History:  Diagnosis Date   Acute midline low back pain without sciatica 08/13/2016   Acute on chronic respiratory failure with hypoxia ([place]) 08/21/2018    Acute respiratory failure with hypoxia ([place]) 12/11/2017   Allergy    Aortic atherosclerosis ([place])    Atypical atrial flutter ([place])    a.) s/p ablation 07/27/2013 and 05/10/2016   Balance problem    CAD (coronary artery disease)    a. s/p MI x 2 in 2002 s/p PCI x 2 in 2002; b. s/p 2v CABG 2002; c. stress echo 07/2004 w/ evi of pos & inf infarct & no evi of ischemia; d. 4/08 dipyridamole scan w/ multiple areas of infarct, no ischemia, EF 49%; e. cath 04/28/15 3v CAD, med Rx rec, no targets for revasc, LM lum irregs, pLAD 30%, 100%, ost-pLCx 60%, [name] 99%, OM2 100%, p-mRCA 90%, [name] 100% L-R collats, VG-mLAD irregs, VG-OM2 oc   Chronic anticoagulation    a.) Apixaban   Chronic systolic CHF (congestive heart failure) ([place])    a. echo 03/2015: EF 30-35%, sev ant/inf/pos HK, in mild to mod MR   Closed fracture of right proximal humerus 01/02/3975   Complication of anesthesia    a.) postoperative apnea. b.) postoperative hypoxia.   Compressed spine fracture ([place]) 08/06/2016   a.) L2, T11, T12   COPD (chronic obstructive pulmonary disease) (HCC)    Deep vein thrombosis (DVT) of left lower extremity ([place]) 12/2001   Fracture of multiple pubic rami, right, closed, initial encounter ([place]) 05/27/2016   a.) RIGHT superior and inferior pubic rami fractures s/p mechanical fall.   GERD (gastroesophageal reflux disease)    HLD (hyperlipidemia)    HTN (hypertension)    Hypothyroidism    Ischemic cardiomyopathy    Mitral regurgitation    a. s/p mitral ring placement 09/2000; b. echo 09/2010:  EF 50%, inf HK, post AK, mild MR, prosthetic mitral valve ring w/ peak gradient of 10 mmHg; b. echo 2/13: EF 50%, mild MR/TR      Myocardial infarction ([place]) 2002   a.) x 2 in 2002; PCI with stents (unknown type) placed to Jenny p-mRCA, [name], and [name].   Neuropathy    Osteoarthritis    Osteoporosis    Oxygen dependent    a.) 3 L/[place]   Pacemaker    a. MDT 2002; b. generator replacement 2013; c. followed by Dr.  Omelia Blackwater, MD   PAF (paroxysmal atrial fibrillation) [facility])    a.) s/p DCCV on 04/08/2017. b.) on apixaban   Personal history of chemotherapy    Personal history of radiation therapy    Pneumonia    S/P CABG x 2 2002   a.) SVG-LAD, SVG-OM   Squamous cell carcinoma of right lung ([place]) 01/03/2015   a.) stage IIIa. b.) s/p concurrent chemotherapy (carboplatin + paclitaxel) and XRT (IMRT 6000 cGy)   SVT (supraventricular tachycardia) (HCC)    Type II diabetes mellitus with complication ([place]) 67/34/1935   Past Surgical History:  Procedure Laterality Date   APPENDECTOMY     CARDIAC CATHETERIZATION N/A 04/28/2015   Procedure: Left Heart Cath and Coronary Angiography;  Surgeon: Wellington Hampshire, MD;  Location: [facility];  Service: Cardiovascular;  Laterality: N/A;   CARDIAC ELECTROPHYSIOLOGY STUDY AND ABLATION N/A 05/10/2016   Procedure(s): INTRACARDIAC ELECTROPHYSIOLOGIC 3D MAPPING, STIMULATION PACING HEART, ICAR CATHETER ABLATION ARRHYTHMIA ADD ON, ABLATE L/R ATRIAL FIBRIL W/ISOLATED PULM VEIN, INTRACARD ECHO, THER/DX INTERVENT; Location: [facility]; Surgeon: Norm Salt, MD   CARDIOVERSION N/A 04/08/2017   Procedure: CARDIOVERSION;  Surgeon: Wellington Hampshire, MD;  Location: [facility];  Service: Cardiovascular;  Laterality: N/A;   CHEST TUBE INSERTION N/A 07/11/2018   Procedure: TKWIOX CATH INSERTION;  Surgeon: [name], MD;  Location: [facility];  Service: Thoracic;  Laterality: N/A;   COLONOSCOPY  12/2009   2 small tubular adenomas   CORONARY ANGIOPLASTY WITH STENT PLACEMENT Left 2002   s/p MI x 2 with subsequent PCI x 2 ; stents (unknown type) placed to Jenny p-mRCA, [name], and [name].   CORONARY ARTERY BYPASS GRAFT  09/2000   DRAIN REMOVAL Right 08/10/2018   Procedure: DRAIN REMOVAL;  Surgeon: [name], MD;  Location: [facility];  Service: General;  Laterality: Right;   ECTOPIC PREGNANCY SURGERY     ELECTROMAGNETIC NAVIGATION BROCHOSCOPY N/A 10/06/2015   Procedure:  ELECTROMAGNETIC NAVIGATION BRONCHOSCOPY;  Surgeon: Flora Lipps, MD;  Location: [facility];  Service: Cardiopulmonary;  Laterality: N/A;   ENDOBRONCHIAL ULTRASOUND N/A 10/06/2015   Procedure: ENDOBRONCHIAL ULTRASOUND;  Surgeon: Flora Lipps, MD;  Location: [facility];  Service: Cardiopulmonary;  Laterality: N/A;   EYE SURGERY     FLEXIBLE BRONCHOSCOPY Right 07/11/2018   Procedure: FLEXIBLE BRONCHOSCOPY;  Surgeon: [name], MD;  Location: [facility];  Service: Thoracic;  Laterality: Right;   KYPHOPLASTY N/A 09/28/2016   Procedure: KYPHOPLASTY;  Surgeon: Hessie Knows, MD;  Location: [facility];  Service: Orthopedics;  Laterality: N/A;   KYPHOPLASTY N/A 02/20/2018   Procedure: [id number];  Surgeon: Hessie Knows, MD;  Location: [facility];  Service: Orthopedics;  Laterality: N/A;   PACEMAKER INSERTION  03/2012   REVERSE SHOULDER ARTHROPLASTY Right 06/02/2021   Procedure: REVERSE SHOULDER ARTHROPLASTY;  Surgeon: Corky Mull, MD;  Location: [facility];  Service: Orthopedics;  Laterality: Right;   SVT ABLATION N/A 07/27/2013   Procedure: SVT ABLATION; Location: [facility]; Surgeon: Rolla Etienne, MD  thorocentesis  12/24/2016   VAGINAL HYSTERECTOMY     partial - left ovary remains   VIDEO ASSISTED THORACOSCOPY Right 07/11/2018   Procedure: VIDEO ASSISTED THORACOSCOPY;  Surgeon: [name], MD;  Location: [facility];  Service: Thoracic;  Laterality: Right;   VIDEO ASSISTED THORACOSCOPY (VATS) W/TALC PLEUADESIS Right 07/11/2018   Procedure: THORACOTOMY PLEURAL BIOPSY WITH TALC PLEURODESIS;  Surgeon: [name], MD;  Location: [facility];  Service: Thoracic;  Laterality: Right;  pleural biopsy   Family History  Problem Relation Age of Onset   COPD Mother        sister, and brother   Lung disease Father    Stroke Maternal Grandmother    Hypertension Sister    COPD Sister    Hypertension Brother    COPD Brother    Colon cancer Brother    Heart attack Neg Hx    Breast cancer Neg Hx    Social  History   Tobacco Use   Smoking status: Former    Packs/day: 1.00    Years: 40.00    Pack years: 40.00    Types: Cigarettes    Quit date: 2001    Years since quitting: 22.1   Smokeless tobacco: Never   Tobacco comments:    quit smoking in 08/28/2000. Smoking cessation materials not required  Substance Use Topics   Alcohol use: Yes    Alcohol/week: 10.0 standard drinks    Types: 10 Glasses of wine per week    Comment: 1 glass of wine per day   Allergies  Allergen Reactions   Cefdinir Rash    Had hives after completing treatment - unsure if it was Jenny cause.  Has received 1st generation cephalosporin many times (04/24/2012, 07/27/2013, 09/28/2016, 02/20/2018, 07/11/2018, 08/10/2018 without documented ADRs.   Lovenox [Enoxaparin Sodium] Itching   Meperidine Other (See Comments)    Other Reaction: pt does not like how it makes her feel Other reaction(s): Other (See Comments) Other Reaction: pt does not like how it makes her feel   Prior to Admission medications   Medication Sig Start Date End Date Taking? Authorizing Provider  acetaminophen (TYLENOL) 500 MG tablet Take 500-1,000 mg by mouth every 4 (four) hours as needed for mild pain or moderate pain.   Yes [provider]  apixaban (ELIQUIS) 5 MG TABS tablet TAKE (1) TABLET BY MOUTH TWICE DAILY 08/13/21  Yes Gollan, Kathlene November, MD  baclofen (LIORESAL) 10 MG tablet Take 1 tablet (10 mg total) by mouth 3 (three) times daily. 10/07/21  Yes Glean Hess, MD  budesonide (PULMICORT) 0.5 MG/2ML nebulizer solution INHALE 2 MILLILITERS BY NEBULIZER 2 TIMES DAILY 07/17/21  Yes Flora Lipps, MD  cetirizine (ZYRTEC) 10 MG tablet TAKE (1) TABLET BY MOUTH EVERY DAY 06/15/18  Yes Glean Hess, MD  fluticasone [facility]) 50 MCG/ACT nasal spray Place 2 sprays into both nostrils daily. 01/23/20  Yes Glean Hess, MD  furosemide (LASIX) 20 MG tablet Take 1 tablet (20 mg total) by mouth 2 (two) times daily. TAKE 1 TABLET BY MOUTH  TWICE DAILY, ALTERNATING WITH 1 EXTRA TABLET AS NEEDED when weight up by 3 lbs 08/04/21  Yes Gollan, Kathlene November, MD  ipratropium-albuterol (DUONEB) 0.5-2.5 (3) MG/3ML SOLN TAKE 3 MLS BY NEBULIZATION EVERY 6 HOURSAS NEEDED FOR WHEEZING OR SHORTNESS OF BREATH 06/18/21  Yes Flora Lipps, MD  levothyroxine (SYNTHROID) 88 MCG tablet TAKE (1) TABLET BY MOUTH EVERY DAY BEFORE BREAKFAST 01/08/21  Yes Glean Hess, MD  Multiple Vitamins-Minerals (CENTRUM  SILVER PO) Take 1 tablet by mouth daily.   Yes [provider]  OXYGEN Inhale 3 L into Jenny lungs.    Yes [provider]  potassium chloride SA (KLOR-CON M) 20 MEQ tablet Take 1 tablet (20 mEq total) by mouth daily. 08/04/21  Yes Gollan, Kathlene November, MD  predniSONE (DELTASONE) 10 MG tablet Take 6 on day 1and 2, 5 on day 3 and 4, 4 on day 5 and 6 , 3 on day 7 and 8, 2 on day 9 and 10 and 1 on day 11 and 12 then stop. 10/07/21 10/19/21 Yes Glean Hess, MD  rosuvastatin (CRESTOR) 5 MG tablet TAKE ONE (1) TABLET BY MOUTH ONCE DAILY Cooper taking differently: Take 5 mg by mouth at bedtime. 03/30/21  Yes Gollan, Kathlene November, MD  vitamin B-12 (CYANOCOBALAMIN) 1000 MCG tablet Take 1,000 mcg by mouth daily.   Yes [provider]  YUPELRI 175 MCG/3ML nebulizer solution Use one vial in nebulizer once daily. Do not mix with other nebulized medications. 04/23/21  Yes Flora Lipps, MD  metoprolol succinate (TOPROL-XL) 50 MG 24 hr tablet Take 1 tablet (50 mg total) by mouth in Jenny morning and at bedtime. 10/14/21   Alisa Graff, FNP   Review of Systems  Constitutional:  Positive for fatigue. Negative for appetite change.  HENT:  Negative for congestion, rhinorrhea and sore throat.   Eyes: Negative.   Respiratory:  Positive for shortness of breath. Negative for cough and chest tightness.   Cardiovascular:  Negative for chest pain, palpitations and leg swelling.  Gastrointestinal:  Positive for abdominal distention. Negative for abdominal pain.   Endocrine: Negative.   Genitourinary: Negative.   Musculoskeletal:  Positive for arthralgias (left leg pain) and back pain. Negative for myalgias and neck pain.  Skin: Negative.   Allergic/Immunologic: Negative.   Neurological:  Negative for dizziness and light-headedness.  Hematological:  Negative for adenopathy. Does not bruise/bleed easily.  Psychiatric/Behavioral:  Negative for dysphoric mood and sleep disturbance (sleeping on 1 pillow). Jenny Cooper is not nervous/anxious.    Vitals:   10/14/21 1301  BP: (!) 146/90  Pulse: (!) 107  Resp: 16  SpO2: 94%  Weight: 136 lb (61.7 kg)  Height: _0  (1.676 m)   Wt Readings from Last 3 Encounters:  10/14/21 136 lb (61.7 kg)  10/07/21 138 lb (62.6 kg)  09/22/21 141 lb (64 kg)   Lab Results  Component Value Date   CREATININE 1.09 (H) 10/07/2021   CREATININE 1.02 (H) 07/07/2021   CREATININE 0.99 06/29/2021   Physical Exam Vitals and nursing note reviewed. Exam conducted with a chaperone present (family member).  Constitutional:      Appearance: Normal appearance. She is well-developed.  HENT:     Head: Normocephalic and atraumatic.  Neck:     Vascular: No JVD.  Cardiovascular:     Rate and Rhythm: Tachycardia present. Rhythm irregular.  Pulmonary:     Effort: Pulmonary effort is normal.     Breath sounds: No wheezing or rales.  Abdominal:     General: There is distension.     Palpations: Abdomen is soft.     Tenderness: There is no abdominal tenderness.  Musculoskeletal:        General: No tenderness.     Cervical back: Normal range of motion and neck supple.     Right lower leg: No edema.     Left lower leg: No edema.  Skin:    General: Skin is warm  and dry.  Neurological:     General: No focal deficit present.     Mental Status: She is alert and oriented to person, place, and time.  Psychiatric:        Behavior: Behavior normal.        Thought Content: Thought content normal.   Assessment & Plan:   1: Chronic  heart failure with reduced ejection fraction- - NYHA class II - euvolemic today - weighing daily; reminded to call for an overnight weight gain of >2 pounds or a weekly weight gain of >5 pounds - weight down 4 pounds from last visit here 3 months ago - adding "some" salt but only to eggs; says that she doesn't eat eggs on a daily basis - on GDMT of metoprolol - has history of hypotension so may not be able to add entresto or spironolactone - consider adding SGLT2  - saw cardiology Rockey Situ) 09/04/21 - BNP from 06/26/21 was 644.6  2: Atrial fibrillation- - on apixaban - saw EP Glennon Mac) 07/09/21  3: HTN- - BP elevated (146/90) but she reports a lot of back pain - saw PCP Army Melia) 10/07/21 - BMP from 10/07/21 reviewed and showed sodium 141, potassium 4.2, creatinine 1.09 and GFR 50  4: COPD- - saw pulmonology Lanney Gins) 05/29/21 - wearing oxygen at 2L around Jenny clock   Medication list reviewed.   Return in 7 months, sooner if needed

## 2021-10-14 ENCOUNTER — Ambulatory Visit: Payer: Medicare Other | Attending: Family | Admitting: Family

## 2021-10-14 ENCOUNTER — Encounter: Payer: Self-pay | Admitting: Family

## 2021-10-14 ENCOUNTER — Other Ambulatory Visit: Payer: Self-pay

## 2021-10-14 VITALS — BP 146/90 | HR 107 | Resp 16 | Ht 66.0 in | Wt 136.0 lb

## 2021-10-14 DIAGNOSIS — Z86718 Personal history of other venous thrombosis and embolism: Secondary | ICD-10-CM | POA: Diagnosis not present

## 2021-10-14 DIAGNOSIS — J449 Chronic obstructive pulmonary disease, unspecified: Secondary | ICD-10-CM | POA: Diagnosis not present

## 2021-10-14 DIAGNOSIS — E039 Hypothyroidism, unspecified: Secondary | ICD-10-CM | POA: Insufficient documentation

## 2021-10-14 DIAGNOSIS — I252 Old myocardial infarction: Secondary | ICD-10-CM | POA: Diagnosis not present

## 2021-10-14 DIAGNOSIS — Z85118 Personal history of other malignant neoplasm of bronchus and lung: Secondary | ICD-10-CM | POA: Diagnosis not present

## 2021-10-14 DIAGNOSIS — I480 Paroxysmal atrial fibrillation: Secondary | ICD-10-CM

## 2021-10-14 DIAGNOSIS — M5430 Sciatica, unspecified side: Secondary | ICD-10-CM | POA: Diagnosis not present

## 2021-10-14 DIAGNOSIS — M2559 Pain in other specified joint: Secondary | ICD-10-CM | POA: Diagnosis not present

## 2021-10-14 DIAGNOSIS — I5022 Chronic systolic (congestive) heart failure: Secondary | ICD-10-CM | POA: Diagnosis present

## 2021-10-14 DIAGNOSIS — Z7901 Long term (current) use of anticoagulants: Secondary | ICD-10-CM | POA: Diagnosis not present

## 2021-10-14 DIAGNOSIS — I10 Essential (primary) hypertension: Secondary | ICD-10-CM

## 2021-10-14 DIAGNOSIS — K219 Gastro-esophageal reflux disease without esophagitis: Secondary | ICD-10-CM | POA: Insufficient documentation

## 2021-10-14 DIAGNOSIS — R140 Abdominal distension (gaseous): Secondary | ICD-10-CM | POA: Insufficient documentation

## 2021-10-14 DIAGNOSIS — Z87891 Personal history of nicotine dependence: Secondary | ICD-10-CM | POA: Insufficient documentation

## 2021-10-14 DIAGNOSIS — I110 Hypertensive heart disease with heart failure: Secondary | ICD-10-CM | POA: Diagnosis not present

## 2021-10-14 DIAGNOSIS — E785 Hyperlipidemia, unspecified: Secondary | ICD-10-CM | POA: Diagnosis not present

## 2021-10-14 DIAGNOSIS — Z9981 Dependence on supplemental oxygen: Secondary | ICD-10-CM | POA: Insufficient documentation

## 2021-10-14 DIAGNOSIS — I4891 Unspecified atrial fibrillation: Secondary | ICD-10-CM | POA: Diagnosis not present

## 2021-10-14 MED ORDER — METOPROLOL SUCCINATE ER 50 MG PO TB24: 50.0000 mg | ORAL_TABLET | Freq: Two times a day (BID) | ORAL | 10 refills | Status: DC

## 2021-10-14 NOTE — Patient Instructions (Addendum)
Continue weighing daily and call for an overnight weight gain of 3 pounds or more or a weekly weight gain of more than 5 pounds.   I've sent in a new prescription for your metoprolol to take 50mg  twice daily

## 2021-10-15 ENCOUNTER — Encounter: Payer: Self-pay | Admitting: Internal Medicine

## 2021-11-02 ENCOUNTER — Ambulatory Visit: Payer: Self-pay

## 2021-11-04 ENCOUNTER — Other Ambulatory Visit: Payer: Self-pay

## 2021-11-04 ENCOUNTER — Emergency Department
Admission: EM | Admit: 2021-11-04 | Discharge: 2021-11-04 | Disposition: A | Discharge status: Home / Self Care | Payer: Medicare Other | Attending: Emergency Medicine | Admitting: Emergency Medicine

## 2021-11-04 ENCOUNTER — Emergency Department: Payer: Medicare Other

## 2021-11-04 DIAGNOSIS — Z950 Presence of cardiac pacemaker: Secondary | ICD-10-CM | POA: Diagnosis not present

## 2021-11-04 DIAGNOSIS — R1032 Left lower quadrant pain: Secondary | ICD-10-CM | POA: Insufficient documentation

## 2021-11-04 DIAGNOSIS — E039 Hypothyroidism, unspecified: Secondary | ICD-10-CM | POA: Insufficient documentation

## 2021-11-04 DIAGNOSIS — I110 Hypertensive heart disease with heart failure: Secondary | ICD-10-CM | POA: Diagnosis not present

## 2021-11-04 DIAGNOSIS — I509 Heart failure, unspecified: Secondary | ICD-10-CM | POA: Diagnosis not present

## 2021-11-04 DIAGNOSIS — R1011 Right upper quadrant pain: Secondary | ICD-10-CM

## 2021-11-04 LAB — COMPREHENSIVE METABOLIC PANEL
ALT: 12 U/L (ref 0–44)
AST: 28 U/L (ref 15–41)
Albumin: 4.1 g/dL (ref 3.5–5.0)
Alkaline Phosphatase: 100 U/L (ref 38–126)
Anion gap: 8 (ref 5–15)
BUN: 28 mg/dL — ABNORMAL HIGH (ref 8–23)
CO2: 33 mmol/L — ABNORMAL HIGH (ref 22–32)
Calcium: 9.9 mg/dL (ref 8.9–10.3)
Chloride: 98 mmol/L (ref 98–111)
Creatinine, Ser: 1.08 mg/dL — ABNORMAL HIGH (ref 0.44–1.00)
GFR, Estimated: 51 mL/min — ABNORMAL LOW (ref >60)
Glucose, Bld: 108 mg/dL — ABNORMAL HIGH (ref 70–99)
Potassium: 4.2 mmol/L (ref 3.5–5.1)
Sodium: 139 mmol/L (ref 135–145)
Total Bilirubin: 0.8 mg/dL (ref 0.3–1.2)
Total Protein: 8.3 g/dL — ABNORMAL HIGH (ref 6.5–8.1)

## 2021-11-04 LAB — CBC
HCT: 42.8 % (ref 36.0–46.0)
Hemoglobin: 13.3 g/dL (ref 12.0–15.0)
MCH: 29 pg (ref 26.0–34.0)
MCHC: 31.1 g/dL (ref 30.0–36.0)
MCV: 93.2 fL (ref 80.0–100.0)
Platelets: 249 10*3/uL (ref 150–400)
RBC: 4.59 MIL/uL (ref 3.87–5.11)
RDW: 15.9 % — ABNORMAL HIGH (ref 11.5–15.5)
WBC: 4.8 10*3/uL (ref 4.0–10.5)
nRBC: 0 % (ref 0.0–0.2)

## 2021-11-04 LAB — URINALYSIS, ROUTINE W REFLEX MICROSCOPIC
Bilirubin Urine: NEGATIVE
Glucose, UA: NEGATIVE mg/dL
Hgb urine dipstick: NEGATIVE
Ketones, ur: NEGATIVE mg/dL
Leukocytes,Ua: NEGATIVE
Nitrite: NEGATIVE
Protein, ur: NEGATIVE mg/dL
Specific Gravity, Urine: 1.028 (ref 1.005–1.030)
pH: 5 (ref 5.0–8.0)

## 2021-11-04 LAB — LACTIC ACID, PLASMA
Lactic Acid, Venous: 2 mmol/L (ref 0.5–1.9)
Lactic Acid, Venous: 1.5 mmol/L (ref 0.5–1.9)
Lactic Acid, Venous: 1 mmol/L (ref 0.5–1.9)

## 2021-11-04 LAB — TROPONIN I (HIGH SENSITIVITY)
Troponin I (High Sensitivity): 34 ng/L — ABNORMAL HIGH (ref <18)
Troponin I (High Sensitivity): 14 ng/L (ref <18)

## 2021-11-04 LAB — LIPASE, BLOOD: Lipase: 28 U/L (ref 11–51)

## 2021-11-04 MED ORDER — LACTATED RINGERS IV BOLUS
1000.0000 mL | Freq: Once | INTRAVENOUS | Status: AC
  Administered 2021-11-04: 1000 mL via INTRAVENOUS

## 2021-11-04 MED ORDER — LIDOCAINE VISCOUS HCL 2 % MT SOLN
15.0000 mL | Freq: Once | OROMUCOSAL | Status: AC
  Administered 2021-11-04: 15 mL via ORAL
  Filled 2021-11-04: qty 15

## 2021-11-04 MED ORDER — PANTOPRAZOLE SODIUM 20 MG PO TBEC: 20.0000 mg | DELAYED_RELEASE_TABLET | Freq: Every day | ORAL | 0 refills | Status: DC

## 2021-11-04 MED ORDER — PANTOPRAZOLE SODIUM 40 MG IV SOLR
40.0000 mg | Freq: Once | INTRAVENOUS | Status: AC
  Administered 2021-11-04: 40 mg via INTRAVENOUS
  Filled 2021-11-04: qty 10

## 2021-11-04 MED ORDER — ALUM & MAG HYDROXIDE-SIMETH 200-200-20 MG/5ML PO SUSP
30.0000 mL | Freq: Once | ORAL | Status: AC
  Administered 2021-11-04: 30 mL via ORAL
  Filled 2021-11-04: qty 30

## 2021-11-04 MED ORDER — LIDOCAINE 5 % EX PTCH: 1.0000 | MEDICATED_PATCH | Freq: Two times a day (BID) | CUTANEOUS | 0 refills | Status: AC

## 2021-11-04 MED ORDER — IOHEXOL 350 MG/ML SOLN
100.0000 mL | Freq: Once | INTRAVENOUS | Status: AC | PRN
  Administered 2021-11-04: 100 mL via INTRAVENOUS

## 2021-11-04 NOTE — ED Provider Notes (Signed)
? ?[facility] ?Provider Note ? ? ? Event Date/Time  ? First MD Initiated Contact with Patient 11/04/21 1306   ?  (approximate) ? ? ?History  ? ?Abdominal Pain ? ? ?HPI ? ?Jenny Cooper is a 86 y.o. female with a past history of atrial flutter, CHF, pacemaker, hypothyroidism, hypertension who was sent to the ED from orthopedic clinic due to left lower quadrant abdominal pain, worsening for the past week, gradual onset, waxing and waning, worse after eating.  No alleviating factors. ? ?She has also been experiencing constipation, no BM in the last 5 days.  Previous to that, BMs had been large and hard. ?  ? ? ?Physical Exam  ? ?Triage Vital Signs: ?ED Triage Vitals  ?Enc Vitals Group  ?   BP 11/04/21 1259 102/61  ?   Pulse Rate 11/04/21 1259 97  ?   Resp 11/04/21 1259 18  ?   Temp 11/04/21 1322 97.7 ?F (36.5 ?C)  ?   Temp Source 11/04/21 1322 Oral  ?   SpO2 11/04/21 1259 93 %  ?   Weight 11/04/21 1258 133 lb (60.3 kg)  ?   Height 11/04/21 1258 5\' 6"  (1.676 m)  ?   Head Circumference --   ?   Peak Flow --   ?   Pain Score 11/04/21 1257 4  ?   Pain Loc --   ?   Pain Edu? --   ?   Excl. in [place]? --   ? ? ?Most recent vital signs: ?Vitals:  ? 11/04/21 1259 11/04/21 1322  ?BP: 102/61   ?Pulse: 97   ?Resp: 18   ?Temp:  97.7 ?F (36.5 ?C)  ?SpO2: 93%   ? ? ? ?General: Awake, no distress.  ?CV:  Good peripheral perfusion.  Regular rate and rhythm ?Resp:  Normal effort.  Clear to auscultation bilaterally ?Abd:  No distention.  Soft and nontender, no peritoneal signs ?Other:  No lower extremity edema. ? ? ?ED Results / Procedures / Treatments  ? ?Labs ?(all labs ordered are listed, but only abnormal results are displayed) ?Labs Reviewed  ?COMPREHENSIVE METABOLIC PANEL - Abnormal; Notable for the following components:  ?    Result Value  ? CO2 33 (*)   ? Glucose, Bld 108 (*)   ? BUN 28 (*)   ? Creatinine, Ser 1.08 (*)   ? Total Protein 8.3 (*)   ? GFR, Estimated 51 (*)   ? All other components within  normal limits  ?CBC - Abnormal; Notable for the following components:  ? RDW 15.9 (*)   ? All other components within normal limits  ?LIPASE, BLOOD  ?URINALYSIS, ROUTINE W REFLEX MICROSCOPIC  ? ? ? ?EKG ? ?Interpreted by me ?Indeterminate rhythm, most likely sinus, rate of 90.  Right axis, normal intervals, normal QRS ST segments and T waves. ? ? ?RADIOLOGY ?CT angiogram of the pelvis pending ? ? ? ?PROCEDURES: ? ?Critical Care performed: No ? ?Procedures ? ? ?MEDICATIONS ORDERED IN ED: ?Medications  ?lactated ringers bolus 1,000 mL (1,000 mLs Intravenous New Bag/Given 11/04/21 1355)  ? ? ? ?IMPRESSION / MDM / ASSESSMENT AND PLAN / ED COURSE  ?I reviewed the triage vital signs and the nursing notes. ?             ?               ? ?Differential diagnosis includes, but is not limited to, constipation, diverticulitis, bowel obstruction, bowel perforation,  intra-abdominal abscess, mesenteric ischemia ? ?Patient presents with abdominal pain most concerning for constipation.  However with age and comorbidities, will obtain CT angiogram to evaluate for infectious or vascular process.  Vital signs are currently normal, nontoxic and not septic. ? ? ?  ? ? ?FINAL CLINICAL IMPRESSION(S) / ED DIAGNOSES  ? ?Final diagnoses:  ?Left lower quadrant abdominal pain  ? ? ? ?Rx / DC Orders  ? ?ED Discharge Orders   ? ? None  ? ?  ? ? ? ?Note:  This document was prepared using Dragon voice recognition software and may include unintentional dictation errors. ?  ?Carrie Mew, MD ?11/04/21 1501 ? ?

## 2021-11-04 NOTE — ED Triage Notes (Signed)
Pt comes pov from Dr Poggi's office for abd pain. Has L5 compression fracture but started having abd pain. He was also concerned for more fractures and wanted imaging done here. Last BM Friday after miralax. Not on any pain medication. Pt on 3L [place] chronically.  ?

## 2021-11-04 NOTE — ED Notes (Signed)
Pt knows need for UA  

## 2021-11-04 NOTE — ED Notes (Signed)
ED Provider at bedside. 

## 2021-11-04 NOTE — Discharge Instructions (Addendum)
Take the Protonix to help with any acid reflux and the Lidoderm patches and call the GI doctor for follow-up.  At this time I doubt you need to call the surgeon but given your number just in case.  You could also discuss with your cardiologist and the orthopedic doctor for your other results that we discussed ? ?Return to the ER if develop worsening pain or any other concern ? ? ?IMPRESSION: ?1. Subacute/incompletely healed L5 compression fracture with 35% ?height loss. ?2. L3 compression fracture with 25% height loss, of indeterminate ?acuity but new from 2019. ?3. Subacute to chronic L1 compression fracture with 15% height loss. ?4. Previously augmented T11, T12, L2, and L4 compression fractures. ?5. Severe lower lumbar facet arthrosis with grade 1 anterolisthesis ?and at least moderate spinal stenosis at L4-5. ?6. Aortic Atherosclerosis (ICD10-I70.0). ? ?IMPRESSION: ?VASCULAR ?  ?1. Diseased but patent celiac artery, SMA and IMA, as above, without ?significant stenosis. No evidence of vascular compromise to bowel. ?2. Aortic atherosclerosis without CTA evidence of dissection or ?aneurysmal dilation. Aortic Atherosclerosis (ICD10-I70.0). ?  ?NON-VASCULAR ?  ?1. No acute abdominopelvic process. ?2. Coarse RIGHT basilar interstitial pulmonary thickening. Findings ?suspicious for chronic aspiration change. ?Additional incidental, chronic and senescent findings as above. ?  ?

## 2021-11-04 NOTE — ED Notes (Signed)
US at bedside

## 2021-11-04 NOTE — ED Provider Notes (Addendum)
4:11 PM Assumed care for off going team.  ? ?Blood pressure 131/75, pulse 75, temperature 97.7 ?F (36.5 ?C), temperature source Oral, resp. rate 18, height 5\' 6"  (1.676 m), weight 60.3 kg, SpO2 95 %. ? ?See their HPI for full report but in brief L5 compression fracture now with LLQ pain--constipation? BM 5 days ago--CT Pending-- ? ? ?Patient had CT L-spine obtained that shows age-indeterminate other compression fractures.  I provided a copy of reports that she can discuss it with her orthopedic doctor however the main reason she came in was with concern for the abdominal pain ? ?I personally reviewed the CT did not see anything acute and waiting the read from radiology. ? ?CT scan is overall reassuring without any evidence of acute issues.  There is some coarse right basilar interstitial pulmonary thickening that could be concern for chronic aspiration but she denies any respiratory symptoms. ? ?Patient is lactate was slightly elevated at 2 and patient is given a liter of fluid due to concern from the other doctor about some dehydration and constipation.  The rest of her labs are reassuring around her baseline. ? ?Patient is reporting some upper abdominal discomfort more in her left upper side.  I added on ultrasound given the CT scan did show concern for some gallstones just to make sure she does not have cholecystitis.  There also could be a component of acid reflux or GERD causing her her symptoms.  Ordered a GI cocktail, Protonix. ? ?Troponin was slightly elevated at 34 which I added on and will get a repeat.  Repeat lactate was downtrending to 1.5.  Ultrasound did not have any evidence of acute cholecystitis and again patient's pain does not seem to be focused on her right side.  I reviewed the LFTs and lipase and these were normal. ? ?Repeat troponin was downtrending to 14.  Discussed with family that this was kind of a big drop going down but it was more than a 2-hour time between them.  We discussed the  possibility of her having an issue with her heart previously and now the troponins are coming down but she reports this is going on for 2 weeks and that she had echocardiogram 1 week ago that was normal and that she has never had any pain in her chest or shortness of breath the pain is all but in her abdomen.  I doubt this is an ACS equivalent and given the troponins are downtrending and not going up family would prefer to follow-up outpatient with her cardiologist.  I am going to give them GI follow-up for possible cirrhosis, acid reflux and started on a PPI give her surgery follow-up for her sludge.  I suspect that some of her pain seems to be worse with movement so I wonder if it is just referred pain from the known compression fractures.  Obviously this is very complex given patient's age there could be more than 1 thing that we did to her pain but given the reassuring work-up and lab work we had discussed admission versus going home and family feel very comfortable going home and following up outpatient given symptoms are getting better.  They understand that if symptoms are worsening or she develops any pain in her chest that she needs to return to the ER immediately ? ?I discussed the provisional nature of ED diagnosis, the treatment so far, the ongoing plan of care, follow up appointments and return precautions with the patient and any family or support  people present. They expressed understanding and agreed with the plan, discharged home. ? ? ? ?  ?Vanessa [place], MD ?11/04/21 1957 ? ?  ?Vanessa  Chapel, MD ?11/04/21 2004 ? ?

## 2021-11-05 ENCOUNTER — Other Ambulatory Visit: Payer: Self-pay

## 2021-11-06 ENCOUNTER — Encounter: Payer: Self-pay | Admitting: Internal Medicine

## 2021-11-09 ENCOUNTER — Ambulatory Visit: Payer: Self-pay

## 2021-11-09 NOTE — Telephone Encounter (Signed)
? ?  Chief Complaint: Lower left abdomen ?Symptoms: Seen in ED 11/04/21 ?Frequency: Started 2 weeks ago. Has constipation issues. ?Pertinent Negatives: Patient denies diarrhea ?Disposition: [] ED /[] Urgent Care (no appt availability in office) / [x] Appointment(In office/virtual)/ []  [facility]/ [] Home Care/ [] Refused Recommended Disposition /[] [facility]/ []  Follow-up with PCP ?Additional Notes:   ?Answer Assessment - Initial Assessment Questions ?1. LOCATION: "Where does it hurt?"  ?    Lower left and to right ?2. RADIATION: "Does the pain shoot anywhere else?" (e.g., chest, back) ?    Back ?3. ONSET: "When did the pain begin?" (e.g., minutes, hours or days ago)  ?    2 weeks ago ?4. SUDDEN: "Gradual or sudden onset?" ?    Sudden ?5. PATTERN "Does the pain come and go, or is it constant?" ?   - If constant: "Is it getting better, staying the same, or worsening?"  ?    (Note: Constant means the pain never goes away completely; most serious pain is constant and it progresses)  ?   - If intermittent: "How long does it last?" "Do you have pain now?" ?    (Note: Intermittent means the pain goes away completely between bouts) ?    Comes and goes ?6. SEVERITY: "How bad is the pain?"  (e.g., Scale 1-10; mild, moderate, or severe) ?  - MILD (1-3): doesn't interfere with normal activities, abdomen soft and not tender to touch  ?  - MODERATE (4-7): interferes with normal activities or awakens from sleep, abdomen tender to touch  ?  - SEVERE (8-10): excruciating pain, doubled over, unable to do any normal activities  ?    Now - 6 ?7. RECURRENT SYMPTOM: "Have you ever had this type of stomach pain before?" If Yes, ask: "When was the last time?" and "What happened that time?"  ?    No ?8. CAUSE: "What do you think is causing the stomach pain?" ?    Unsure ?9. RELIEVING/AGGRAVATING FACTORS: "What makes it better or worse?" (e.g., movement, antacids, bowel movement) ?    Ice packs and heat ?10. OTHER  SYMPTOMS: "Do you have any other symptoms?" (e.g., back pain, diarrhea, fever, urination pain, vomiting) ?      Constipation ?11. PREGNANCY: "Is there any chance you are pregnant?" "When was your last menstrual period?" ?      No ? ?Protocols used: Abdominal Pain - Female-A-AH ? ?

## 2021-11-10 ENCOUNTER — Other Ambulatory Visit: Payer: Self-pay | Admitting: Cardiovascular Disease

## 2021-11-10 ENCOUNTER — Encounter: Payer: Self-pay | Admitting: Internal Medicine

## 2021-11-10 ENCOUNTER — Ambulatory Visit (INDEPENDENT_AMBULATORY_CARE_PROVIDER_SITE_OTHER): Payer: Medicare Other | Admitting: Internal Medicine

## 2021-11-10 ENCOUNTER — Other Ambulatory Visit: Payer: Self-pay

## 2021-11-10 ENCOUNTER — Other Ambulatory Visit: Payer: Self-pay | Admitting: Surgery

## 2021-11-10 VITALS — BP 102/60 | HR 73 | Ht 66.0 in | Wt 133.0 lb

## 2021-11-10 DIAGNOSIS — S32010A Wedge compression fracture of first lumbar vertebra, initial encounter for closed fracture: Secondary | ICD-10-CM

## 2021-11-10 DIAGNOSIS — R1013 Epigastric pain: Secondary | ICD-10-CM

## 2021-11-10 DIAGNOSIS — S32050A Wedge compression fracture of fifth lumbar vertebra, initial encounter for closed fracture: Secondary | ICD-10-CM | POA: Diagnosis not present

## 2021-11-10 DIAGNOSIS — S32030A Wedge compression fracture of third lumbar vertebra, initial encounter for closed fracture: Secondary | ICD-10-CM

## 2021-11-10 DIAGNOSIS — I25709 Atherosclerosis of coronary artery bypass graft(s), unspecified, with unspecified angina pectoris: Secondary | ICD-10-CM

## 2021-11-10 DIAGNOSIS — S32050S Wedge compression fracture of fifth lumbar vertebra, sequela: Secondary | ICD-10-CM

## 2021-11-10 NOTE — Progress Notes (Signed)
? ? ?Date:  11/10/2021  ? ?Name:  Jenny Cooper   DOB:  [birth year]   MRN:  [id number] ? ? ?Chief Complaint: Abdominal Pain (Following up with GI, and Cardiology. ) ? ?Abdominal Pain ?This is a new problem. The current episode started 1 to 4 weeks ago. The problem occurs daily. The problem has been gradually improving. The pain is located in the epigastric region, LUQ and RUQ. The pain is moderate. The quality of the pain is aching and cramping. The abdominal pain does not radiate. Associated symptoms include constipation. Pertinent negatives include no diarrhea, fever, nausea, vomiting or weight loss. Nothing aggravates the pain. Relieved by: oxycodone given for back pain. Treatments tried: PPI ordered by ER - just arrived today. Prior diagnostic workup includes CT scan and ultrasound.  ?Back Pain ?This is a recurrent problem. The pain is present in the lumbar spine. The quality of the pain is described as burning and cramping. The pain is moderate. Associated symptoms include abdominal pain. Pertinent negatives include no chest pain, fever or weight loss. Risk factors include history of osteoporosis (recent lumbar L1 wedge comp fracture).  ?[place] - no cholecystitis, + gall bladder sludge ?CT abd - IMPRESSION: ?VASCULAR ?  ?1. Diseased but patent celiac artery, SMA and IMA, as above, without ?significant stenosis. No evidence of vascular compromise to bowel. ?2. Aortic atherosclerosis without CTA evidence of dissection or ?aneurysmal dilation. Aortic Atherosclerosis (ICD10-I70.0). ?  ?NON-VASCULAR ?  ?1. No acute abdominopelvic process. ?2. Coarse RIGHT basilar interstitial pulmonary thickening. Findings ?suspicious for chronic aspiration change. ?Additional incidental, chronic and senescent findings as above. ? ?Specifically: Normal spleen, pancreas, adrenals, liver except for cirrhosis.  No enlarged lymph nodes.  There was basilar interstitial thickening on the right lower lung. No hernia.  Her labs were stable and  comparable to baseline. UA was negative. ?She was prescribed Protonix but has not started it yet. ? ? ?Lab Results  ?Component Value Date  ? NA 139 11/04/2021  ? K 4.2 11/04/2021  ? CO2 33 (H) 11/04/2021  ? GLUCOSE 108 (H) 11/04/2021  ? BUN 28 (H) 11/04/2021  ? CREATININE 1.08 (H) 11/04/2021  ? CALCIUM 9.9 11/04/2021  ? EGFR 50 (L) 10/07/2021  ? GFRNONAA 51 (L) 11/04/2021  ? ?Lab Results  ?Component Value Date  ? CHOL 116 10/07/2021  ? HDL 46 10/07/2021  ? [place] 54 10/07/2021  ? TRIG 81 10/07/2021  ? CHOLHDL 2.5 10/07/2021  ? ?Lab Results  ?Component Value Date  ? TSH 10.04 (A) 01/22/2020  ? ?Lab Results  ?Component Value Date  ? HGBA1C 5.5 09/28/2019  ? ?Lab Results  ?Component Value Date  ? WBC 4.8 11/04/2021  ? HGB 13.3 11/04/2021  ? HCT 42.8 11/04/2021  ? MCV 93.2 11/04/2021  ? PLT 249 11/04/2021  ? ?Lab Results  ?Component Value Date  ? ALT 12 11/04/2021  ? AST 28 11/04/2021  ? ALKPHOS 100 11/04/2021  ? BILITOT 0.8 11/04/2021  ? ?No results found for: 25OHVITD2, [place], VD25OH  ? ?Review of Systems  ?Constitutional:  Positive for fatigue. Negative for chills, fever and weight loss.  ?Respiratory:  Positive for shortness of breath. Negative for cough and chest tightness.   ?Cardiovascular:  Negative for chest pain and leg swelling.  ?Gastrointestinal:  Positive for abdominal pain and constipation. Negative for blood in stool, diarrhea, nausea and vomiting.  ?Musculoskeletal:  Positive for back pain and gait problem.  ? ?Patient Active Problem List  ? Diagnosis Date Noted  ?  CHF (congestive heart failure) ([place]) 06/25/2021  ? Hypokalemia 06/23/2021  ? Troponin level elevated 06/23/2021  ? Status post reverse arthroplasty of shoulder, right 06/02/2021  ? Age-related cataract of both eyes 04/29/2021  ? Squamous cell lung cancer ([place]) 02/11/2020  ? Drug-induced polyneuropathy ([place]) 01/23/2020  ? Low vitamin B12 level 01/14/2019  ? Pulmonary hypertension, unspecified ([place]) 12/01/2018  ? Normocytic anemia  10/17/2018  ? Recurrent pleural effusion on right 07/11/2018  ? Environmental and seasonal allergies 12/20/2017  ? Muscle spasms of neck 12/20/2017  ? Coronary artery disease of bypass graft of native heart with stable angina pectoris ([place]) 04/21/2017  ? Chronic combined systolic and diastolic CHF (congestive heart failure) ([place]) 04/14/2017  ? Sinus node dysfunction ([place]) 04/06/2017  ? Back pain of thoracolumbar region 03/15/2017  ? Falls frequently 03/05/2017  ? Cancer of hilus of right lung ([place]) 12/14/2016  ? Compression fracture of L2 lumbar vertebra ([place]) 08/31/2016  ? Cardiomyopathy ([place]) 04/29/2016  ? Hx of adenomatous colonic polyps 11/17/2015  ? Radiation pneumonitis ([place]) 10/21/2015  ? Mitral regurgitation   ? HLD (hyperlipidemia)   ? Paroxysmal supraventricular tachycardia ([place])   ? NSTEMI (non-ST elevated myocardial infarction) ([place])   ? Arteriosclerosis of coronary artery 01/11/2015  ? Hypothyroidism (acquired) 01/11/2015  ? Disorder of peripheral nervous system 10/04/2014  ? Cervical radiculopathy, chronic 10/04/2014  ? Atrial flutter ([place]) 11/16/2013  ? Essential hypertension 08/03/2011  ? Pacemaker 08/03/2011  ? ? ?Allergies  ?Allergen Reactions  ? Cefdinir Rash  ?  Had hives after completing treatment - unsure if it was the cause. ? ?Has received 1st generation cephalosporin many times (04/24/2012, 07/27/2013, 09/28/2016, 02/20/2018, 07/11/2018, 08/10/2018 without documented ADRs.  ? Lovenox [Enoxaparin Sodium] Itching  ? Meperidine Other (See Comments)  ?  Other Reaction: pt does not like how it makes her feel ?Other reaction(s): Other (See Comments) ?Other Reaction: pt does not like how it makes her feel  ? ? ?Past Surgical History:  ?Procedure Laterality Date  ? APPENDECTOMY    ? CARDIAC CATHETERIZATION N/A 04/28/2015  ? Procedure: Left Heart Cath and Coronary Angiography;  Surgeon: Wellington Hampshire, MD;  Location: [facility];  Service: Cardiovascular;  Laterality: N/A;  ? CARDIAC  ELECTROPHYSIOLOGY STUDY AND ABLATION N/A 05/10/2016  ? Procedure(s): INTRACARDIAC ELECTROPHYSIOLOGIC 3D MAPPING, STIMULATION PACING HEART, ICAR CATHETER ABLATION ARRHYTHMIA ADD ON, ABLATE L/R ATRIAL FIBRIL W/ISOLATED PULM VEIN, INTRACARD ECHO, THER/DX INTERVENT; Location: [facility]; Surgeon: Norm Salt, MD  ? CARDIOVERSION N/A 04/08/2017  ? Procedure: CARDIOVERSION;  Surgeon: Wellington Hampshire, MD;  Location: [facility];  Service: Cardiovascular;  Laterality: N/A;  ? CHEST TUBE INSERTION N/A 07/11/2018  ? Procedure: PLEURX CATH INSERTION;  Surgeon: [name], MD;  Location: [facility];  Service: Thoracic;  Laterality: N/A;  ? COLONOSCOPY  12/2009  ? 2 small tubular adenomas  ? CORONARY ANGIOPLASTY WITH STENT PLACEMENT Left 2002  ? s/p MI x 2 with subsequent PCI x 2 ; stents (unknown type) placed to the p-mRCA, [name], and [name].  ? CORONARY ARTERY BYPASS GRAFT  09/2000  ? DRAIN REMOVAL Right 08/10/2018  ? Procedure: DRAIN REMOVAL;  Surgeon: [name], MD;  Location: [facility];  Service: General;  Laterality: Right;  ? ECTOPIC PREGNANCY SURGERY    ? ELECTROMAGNETIC NAVIGATION BROCHOSCOPY N/A 10/06/2015  ? Procedure: ELECTROMAGNETIC NAVIGATION BRONCHOSCOPY;  Surgeon: Flora Lipps, MD;  Location: [facility];  Service: Cardiopulmonary;  Laterality: N/A;  ? ENDOBRONCHIAL ULTRASOUND N/A 10/06/2015  ? Procedure: ENDOBRONCHIAL ULTRASOUND;  Surgeon: Maretta Bees  Mortimer Fries, MD;  Location: [facility];  Service: Cardiopulmonary;  Laterality: N/A;  ? EYE SURGERY    ? FLEXIBLE BRONCHOSCOPY Right 07/11/2018  ? Procedure: FLEXIBLE BRONCHOSCOPY;  Surgeon: [name], MD;  Location: [facility];  Service: Thoracic;  Laterality: Right;  ? KYPHOPLASTY N/A 09/28/2016  ? Procedure: KYPHOPLASTY;  Surgeon: Hessie Knows, MD;  Location: [facility];  Service: Orthopedics;  Laterality: N/A;  ? KYPHOPLASTY N/A 02/20/2018  ? Procedure: [id number]-T0;  Surgeon: Hessie Knows, MD;  Location: [facility];  Service: Orthopedics;  Laterality: N/A;  ? PACEMAKER INSERTION   03/2012  ? REVERSE SHOULDER ARTHROPLASTY Right 06/02/2021  ? Procedure: REVERSE SHOULDER ARTHROPLASTY;  Surgeon: Corky Mull, MD;  Location: [facility];  Service: Orthopedics;  Laterality: Right;  ? SVT ABLATION

## 2021-11-12 ENCOUNTER — Other Ambulatory Visit: Payer: Self-pay

## 2021-11-12 ENCOUNTER — Ambulatory Visit
Admission: RE | Admit: 2021-11-12 | Discharge: 2021-11-12 | Disposition: A | Discharge status: Home / Self Care | Payer: Medicare Other | Source: Ambulatory Visit | Attending: Surgery | Admitting: Surgery

## 2021-11-12 NOTE — H&P (Signed)
Interventional Radiology - Clinic Visit, Initial H&P  ? ? ?Referring Provider (current admission): Poggi, Marshall Cork, MD ? ?Reason for Visit: Lumbar compression fracture(s)  ? ? ?History of Present Illness  ? ?Jenny Cooper is a 85 y.o. female with a complex past medical history including coronary artery disease status post CABG and pacemaker placement with CHF and most recent reported ejection fraction of 40%, COPD on home supplementary oxygen (baseline 2 L), DVT on long-term anticoagulation, and previous spine fractures including cement augmentation at T11, T12, L2 and L4.  She presents today for concern for new lumbar spine compression fractures involving L1, L3 and L5.  ?  ?She states that things began to worsen after September 2022 when she experienced a fall out of bed and subsequently a broken shoulder requiring shoulder arthroplasty.  Earlier this year in January approximally 2 months ago, she began to experience worsening lower back pain and was diagnosed with an L5 compression fracture approximately 4-6 weeks ago.  In February, she was prescribed prednisone for left lower extremity radiculopathy, which improved her sciatic pain but caused worsening back pain radiating to her sides.  ?  ?On March 8th of this year, she had a CT of her lumbar spine.  This demonstrated multilevel compression fractures at L1, L3 and L5.  The L1 compression fracture was described new since October 2022.  The L3 compression fracture was age indeterminate based on most recent available prior imaging.  The L5 compression fracture demonstrated what appeared to be an acute fracture line involving the superior endplate, suggesting more acute to subacute/healing nature.  ?  ?He describes the pain being significant 10/10 at baseline.  She started taking oxycodone a few days ago, with slight improvement in pain to 5/10.  ?  ?She describes the pain as debilitating and is often confined to sitting down as she is unable to stand for long  periods of time.  She normally ambulates with walker, and even so, has experienced increasing difficulty with mobility in the past 2 months.  She scored significant disability on the [employer] disability questionnaire with 19/24 positive.  ?  ? ? ?Additional Past Medical History ?Past Medical History:  ?Diagnosis Date  ? Acute midline low back pain without sciatica 08/13/2016  ? Acute on chronic respiratory failure with hypoxia ([place]) 08/21/2018  ? Acute respiratory failure with hypoxia ([place]) 12/11/2017  ? Allergy   ? Aortic atherosclerosis ([place])   ? Atypical atrial flutter ([place])   ? a.) s/p ablation 07/27/2013 and 05/10/2016  ? Balance problem   ? CAD (coronary artery disease)   ? a. s/p MI x 2 in 2002 s/p PCI x 2 in 2002; b. s/p 2v CABG 2002; c. stress echo 07/2004 w/ evi of pos & inf infarct & no evi of ischemia; d. 4/08 dipyridamole scan w/ multiple areas of infarct, no ischemia, EF 49%; e. cath 04/28/15 3v CAD, med Rx rec, no targets for revasc, LM lum irregs, pLAD 30%, 100%, ost-pLCx 60%, [name] 99%, OM2 100%, p-mRCA 90%, [name] 100% L-R collats, VG-mLAD irregs, VG-OM2 oc  ? Chronic anticoagulation   ? a.) Apixaban  ? Chronic systolic CHF (congestive heart failure) ([place])   ? a. echo 03/2015: EF 30-35%, sev ant/inf/pos HK, in mild to mod MR  ? Closed fracture of right proximal humerus 05/28/2021  ? Complication of anesthesia   ? a.) postoperative apnea. b.) postoperative hypoxia.  ? Compressed spine fracture ([place]) 08/06/2016  ? a.) L2, T11, T12  ?  COPD (chronic obstructive pulmonary disease) ([place])   ? Deep vein thrombosis (DVT) of left lower extremity ([place]) 12/2001  ? Fracture of multiple pubic rami, right, closed, initial encounter ([place]) 05/27/2016  ? a.) RIGHT superior and inferior pubic rami fractures s/p mechanical fall.  ? GERD (gastroesophageal reflux disease)   ? HLD (hyperlipidemia)   ? HTN (hypertension)   ? Hypothyroidism   ? Ischemic cardiomyopathy   ? Mitral regurgitation   ? a. s/p mitral ring  placement 09/2000; b. echo 09/2010: EF 50%, inf HK, post AK, mild MR, prosthetic mitral valve ring w/ peak gradient of 10 mmHg; b. echo 2/13: EF 50%, mild MR/TR     ? Myocardial infarction [facility]) 2002  ? a.) x 2 in 2002; PCI with stents (unknown type) placed to the p-mRCA, [name], and [name].  ? Neuropathy   ? Osteoarthritis   ? Osteoporosis   ? Oxygen dependent   ? a.) 3 L/[place]  ? Pacemaker   ? a. MDT 2002; b. generator replacement 2013; c. followed by Dr. Omelia Blackwater, MD  ? PAF (paroxysmal atrial fibrillation) [facility])   ? a.) s/p DCCV on 04/08/2017. b.) on apixaban  ? Personal history of chemotherapy   ? Personal history of radiation therapy   ? Pneumonia   ? S/P CABG x 2 2002  ? a.) SVG-LAD, SVG-OM  ? Squamous cell carcinoma of right lung ([place]) 01/03/2015  ? a.) stage IIIa. b.) s/p concurrent chemotherapy (carboplatin + paclitaxel) and XRT (IMRT 6000 cGy)  ? SVT (supraventricular tachycardia) ([place])   ? Type II diabetes mellitus with complication ([place]) 47/42/5956  ? ? ? ?Surgical History  ?Past Surgical History:  ?Procedure Laterality Date  ? APPENDECTOMY    ? CARDIAC CATHETERIZATION N/A 04/28/2015  ? Procedure: Left Heart Cath and Coronary Angiography;  Surgeon: Wellington Hampshire, MD;  Location: [facility];  Service: Cardiovascular;  Laterality: N/A;  ? CARDIAC ELECTROPHYSIOLOGY STUDY AND ABLATION N/A 05/10/2016  ? Procedure(s): INTRACARDIAC ELECTROPHYSIOLOGIC 3D MAPPING, STIMULATION PACING HEART, ICAR CATHETER ABLATION ARRHYTHMIA ADD ON, ABLATE L/R ATRIAL FIBRIL W/ISOLATED PULM VEIN, INTRACARD ECHO, THER/DX INTERVENT; Location: [facility]; Surgeon: Norm Salt, MD  ? CARDIOVERSION N/A 04/08/2017  ? Procedure: CARDIOVERSION;  Surgeon: Wellington Hampshire, MD;  Location: [facility];  Service: Cardiovascular;  Laterality: N/A;  ? CHEST TUBE INSERTION N/A 07/11/2018  ? Procedure: PLEURX CATH INSERTION;  Surgeon: [name], MD;  Location: [facility];  Service: Thoracic;  Laterality: N/A;  ? COLONOSCOPY  12/2009  ? 2 small  tubular adenomas  ? CORONARY ANGIOPLASTY WITH STENT PLACEMENT Left 2002  ? s/p MI x 2 with subsequent PCI x 2 ; stents (unknown type) placed to the p-mRCA, [name], and [name].  ? CORONARY ARTERY BYPASS GRAFT  09/2000  ? DRAIN REMOVAL Right 08/10/2018  ? Procedure: DRAIN REMOVAL;  Surgeon: [name], MD;  Location: [facility];  Service: General;  Laterality: Right;  ? ECTOPIC PREGNANCY SURGERY    ? ELECTROMAGNETIC NAVIGATION BROCHOSCOPY N/A 10/06/2015  ? Procedure: ELECTROMAGNETIC NAVIGATION BRONCHOSCOPY;  Surgeon: Flora Lipps, MD;  Location: [facility];  Service: Cardiopulmonary;  Laterality: N/A;  ? ENDOBRONCHIAL ULTRASOUND N/A 10/06/2015  ? Procedure: ENDOBRONCHIAL ULTRASOUND;  Surgeon: Flora Lipps, MD;  Location: [facility];  Service: Cardiopulmonary;  Laterality: N/A;  ? EYE SURGERY    ? FLEXIBLE BRONCHOSCOPY Right 07/11/2018  ? Procedure: FLEXIBLE BRONCHOSCOPY;  Surgeon: [name], MD;  Location: [facility];  Service: Thoracic;  Laterality: Right;  ? KYPHOPLASTY N/A 09/28/2016  ? Procedure:  KYPHOPLASTY;  Surgeon: Hessie Knows, MD;  Location: [facility];  Service: Orthopedics;  Laterality: N/A;  ? KYPHOPLASTY N/A 02/20/2018  ? Procedure: [id number]-Y0;  Surgeon: Hessie Knows, MD;  Location: [facility];  Service: Orthopedics;  Laterality: N/A;  ? PACEMAKER INSERTION  03/2012  ? REVERSE SHOULDER ARTHROPLASTY Right 06/02/2021  ? Procedure: REVERSE SHOULDER ARTHROPLASTY;  Surgeon: Corky Mull, MD;  Location: [facility];  Service: Orthopedics;  Laterality: Right;  ? SVT ABLATION N/A 07/27/2013  ? Procedure: SVT ABLATION; Location: [facility]; Surgeon: Rolla Etienne, MD  ? thorocentesis  12/24/2016  ? VAGINAL HYSTERECTOMY    ? partial - left ovary remains  ? VIDEO ASSISTED THORACOSCOPY Right 07/11/2018  ? Procedure: VIDEO ASSISTED THORACOSCOPY;  Surgeon: [name], MD;  Location: [facility];  Service: Thoracic;  Laterality: Right;  ? VIDEO ASSISTED THORACOSCOPY (VATS) W/TALC PLEUADESIS Right 07/11/2018  ? Procedure:  THORACOTOMY PLEURAL BIOPSY WITH TALC PLEURODESIS;  Surgeon: [name], MD;  Location: [facility];  Service: Thoracic;  Laterality: Right;  pleural biopsy  ? ? ? ?Medications  ?I have reviewed the current medication l

## 2021-11-13 ENCOUNTER — Other Ambulatory Visit: Payer: Self-pay | Admitting: Cardiovascular Disease

## 2021-11-13 ENCOUNTER — Telehealth: Payer: Self-pay | Admitting: Cardiovascular Disease

## 2021-11-13 ENCOUNTER — Other Ambulatory Visit: Payer: Self-pay | Admitting: Surgery

## 2021-11-13 DIAGNOSIS — M8008XA Age-related osteoporosis with current pathological fracture, vertebra(e), initial encounter for fracture: Secondary | ICD-10-CM

## 2021-11-13 DIAGNOSIS — S32010A Wedge compression fracture of first lumbar vertebra, initial encounter for closed fracture: Secondary | ICD-10-CM

## 2021-11-13 DIAGNOSIS — S32050S Wedge compression fracture of fifth lumbar vertebra, sequela: Secondary | ICD-10-CM

## 2021-11-13 DIAGNOSIS — S32030A Wedge compression fracture of third lumbar vertebra, initial encounter for closed fracture: Secondary | ICD-10-CM

## 2021-11-13 LAB — H. PYLORI BREATH TEST: H pylori Breath Test: NEGATIVE

## 2021-11-13 NOTE — Telephone Encounter (Signed)
? ?  Pre-operative Risk Assessment  ?  ?Patient Name: Jenny Cooper  ?DOB: [birth year] ?MRN: [id number]  ? ?  ? ?Request for Surgical Clearance   ? ?Procedure:  Kyphoplasty ? ?Date of Surgery:  Clearance TBD                              ?   ?Surgeon:  not listed ?Surgeon's Group or Practice Name:  [facility] ? ? ?Phone number:  [phone] ?Fax number:  [phone] ?  ?Type of Clearance Requested:  Pharmacy, Eliquis ? ?  ?Type of Anesthesia:  Not Indicated ?  ?Additional requests/questions:   ? ?Signed, ?Pilar A Ham   ?11/13/2021, 4:26 PM   ?

## 2021-11-20 NOTE — Telephone Encounter (Signed)
Patient with diagnosis of atrial fibrillation on Eliquis for anticoagulation.   ? ?Procedure: kyphoplasty ?Date of procedure: TBD ? ? ?CHA2DS2-VASc Score = 7  ? This indicates a 11.2% annual risk of stroke. ?The patient's score is based upon: ?CHF History: 1 ?HTN History: 1 ?Diabetes History: 1 ?Stroke History: 0 ?Vascular Disease History: 1 ?Age Score: 2 ?Gender Score: 1 ?  ?CrCl 38 ?Platelet count 249 ? ?Will need 3 day hold for spinal procedure per protocol.  Because CHADS2-VASc score is 7 will need to confirm with primary cardiologist.   ? ? ?

## 2021-11-23 NOTE — Telephone Encounter (Signed)
Pt has appt 12/07/21 with Dr. Rockey Situ. Will add pre op clearance needed. Will send FYI to requesting office pt has appt 12/07/21 ?

## 2021-11-23 NOTE — Telephone Encounter (Signed)
? ?  Name: PA TENNANT  ?DOB: April 25, 1936  ?MRN: [id number] ? ?Primary Cardiologist: Ida Rogue, MD ? ?Chart reviewed as part of pre-operative protocol coverage. Because of Jenny Cooper's past medical history and time since last visit, she will require a follow-up visit in order to better assess preoperative cardiovascular risk. ? ?Per chart review, patient has a f/u appt scheduled 12/07/21 for ED follow-up. Upon review of ER note it appears the ER had checked labs with an elevated troponin and elevated lactic acid level so they recommended follow-up with cardiology. She is also chronically debilitated on home O2 per notes and unlikely to be able to achieve over 4 METS to clear by phone clearance. Given need for pre-op clearance, would recommend a follow-up appointment prior to clearing. If surgery is planned before 12/07/21, please try to move this up. ? ?Pre-op covering staff: ?- Please schedule appointment and call patient to inform them.  ?- Please contact requesting surgeon's office via preferred method (i.e, phone, fax) to inform them of need for appointment prior to surgery. ? ?Charlie Pitter, PA-C  ?11/23/2021, 9:32 AM  ? ?

## 2021-11-25 ENCOUNTER — Encounter: Payer: Self-pay | Admitting: Cardiovascular Disease

## 2021-11-25 ENCOUNTER — Other Ambulatory Visit: Payer: Self-pay

## 2021-11-25 ENCOUNTER — Ambulatory Visit (INDEPENDENT_AMBULATORY_CARE_PROVIDER_SITE_OTHER): Payer: Medicare Other | Admitting: Cardiovascular Disease

## 2021-11-25 VITALS — BP 100/50 | HR 92 | Ht 66.0 in | Wt 133.4 lb

## 2021-11-25 DIAGNOSIS — I483 Typical atrial flutter: Secondary | ICD-10-CM

## 2021-11-25 DIAGNOSIS — I10 Essential (primary) hypertension: Secondary | ICD-10-CM | POA: Diagnosis not present

## 2021-11-25 DIAGNOSIS — I4821 Permanent atrial fibrillation: Secondary | ICD-10-CM

## 2021-11-25 DIAGNOSIS — I272 Pulmonary hypertension, unspecified: Secondary | ICD-10-CM

## 2021-11-25 DIAGNOSIS — I5042 Chronic combined systolic (congestive) and diastolic (congestive) heart failure: Secondary | ICD-10-CM | POA: Diagnosis not present

## 2021-11-25 DIAGNOSIS — I495 Sick sinus syndrome: Secondary | ICD-10-CM

## 2021-11-25 DIAGNOSIS — I25709 Atherosclerosis of coronary artery bypass graft(s), unspecified, with unspecified angina pectoris: Secondary | ICD-10-CM

## 2021-11-25 NOTE — Patient Instructions (Signed)
Medication Instructions:  No changes  If you need a refill on your cardiac medications before your next appointment, please call your pharmacy.    Lab work: No new labs needed   Testing/Procedures: No new testing needed   Follow-Up: At [facility], you and your health needs are our priority.  As part of our continuing mission to provide you with exceptional heart care, we have created designated Provider Care Teams.  These Care Teams include your primary Cardiologist (physician) and Advanced Practice Providers (APPs -  Physician Assistants and Nurse Practitioners) who all work together to provide you with the care you need, when you need it.  You will need a follow up appointment in 6 months  Providers on your designated Care Team:   Christopher Berge, NP [place], PA-C Cadence Furth, PA-C  COVID-19 Vaccine Information can be found at: [email or web address] For questions related to vaccine distribution or appointments, please email [email or web address] or call [phone].   

## 2021-11-25 NOTE — Progress Notes (Signed)
?  ?  ? ? ? ?Date:  11/25/2021  ? ?ID:  Jenny Cooper, DOB 1936/04/29, MRN [id number] ? ?Patient Location:  ?[place] ?[facility] [place] [id number]  ? ?Provider location:   ?[place], [employer] office ? ?PCP:  Glean Hess, MD  ?Cardiologist:  [facility] ? ?Chief Complaint  ?Patient presents with  ? Pre op clearance   ?  Patient is to have a Kyphoplasty and will need to come off Eliquis 3 days prior; surgery not scheduled yet. Medications reviewed by the patient verbally.   ? ? ?History of Present Illness:   ? ?Jenny Cooper is a 86 y.o. female past medical history of ?CAD s/p remote history of MI in 2002  ?2 vessel CABG post stenting in 2002,  ?mitral valve repair in 2002 secondary to mitral regurgitation,  ?PAF TEE/DCCV in 2013 for her PAF. ?atypical atrial flutter s/p ablation on 07/27/2013  ?atrial fibrillation and typical atrial flutter ablation on 9/11/2017at [facility] ?s/p MDT PPM,   ?COPD,  ?HTN,   ?HLD  ?Quit smoking in 2001 ?carcinoma of right lung, completed chemotherapy ? hx of falls, and chronic back pain from collapsed vertebrae ?Suffered pelvic fracture after fall in September 2017 ?Chronic bronchitis/"copd from covid" ?EF 50 to 55% in sinus rhythm ?Down to 41 to 45% in atrial fibrillation ?She presents today to the clinic for follow-up of her atrial fibrillation/flutter and CAD, pulmonary hypertension ?"Chronic afib" per EP ? ?LOV 1/23 ? ?Needs back surgery, feels trauma to back from falls in 2022 ?Needs clearance to come off the Eliquis 3 days ? ?Chronic shortness of breath, no escalation on her oxygen ?Followed by EP, Long discussion concerning recent medication changes ?EP has been increasing metoprolol succinate dosing for better rate control and an effort to improve her ejection fraction ? ?Echocardiogram done February 2023 through their facility, no change to ejection fraction remains 40 to 45%, markedly elevated right heart pressures but this was not detailed in  final impression ? ?Was told 4 days ago to increase metoprolol from 50 twice daily up to 75 twice daily ?Reports blood pressure running low often high 15Q, 008 systolic ?Has not been checking pressures several hours after she takes the morning medication ? ?Weight continues to run low ?Constipation ? ?EKG personally reviewed by myself on todays visit ?Indeterminate rhythm given low P wave amplitude, narrow complex, 92 bpm ? ?Other past medical history reviewed ?Covid: 12/22 ? ?trauma, "Back fractures" ?Can't stand very long secondary to pain ?After a fall, shoulder injury ? ?Hospital 06/28/2021 ? K of 2.6 with AKI and she was sent to the ER.  ?She was admitted 10/25-10/31 and treated for CHF with IV lasix.  ?She was started on midodrine for hypotension.  ?Toprol was decreased and Entresto held for hypotension.  ?Echo showed LVEF 40-45%, global HL, moderately elevated pulmonary artery pressure. ? ?Shoulder surgery: 06/02/2021 on right ?Other past medical history reviewed ?Followed by [facility] EP ?She underwent cardioversion December 18, 2019, recurrent symptoms Jan 13, 2020, seen by [facility] again Jan 22, 2020, pacemaker changes made ? ?Cardioversion, 04/08/17 ?Lasix IV for management of her CHF ?  ?Echocardiogram showing EF 30 to 35%, moderately elevated RVSP ?  ?Cardioversion at the end of May 2018 for atrial flutter ?Since that time she has felt relatively well, recent vacation in [place] the past week ?Reports that she did not feel right at times but unable to put her finger on close going on ?  ?CT  scan September 2016 showing improvement of her lung cancer ?She finished her cancer treatment over the summer 2016, she had chemotherapy and radiation ?  ?TEE/DCCV in 2013 for her PAF. In 2014 she was diagnosed with atypical atrial flutter and underwent ablation. ?underwent PPM generator change in 2013.  ?  ? 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   ? finished concurrent chemoradiation  with carboplatinum and Taxol and PET scan showed significant response.  ?  ? Echo showed EF 30-35%, severe anterior and infero/posterior wall HK. Left ventricular function parameters were normal, mild to moderate MR. Left atrium was mildly dilated. RV systolic function was normal. Mild to moderate TR. PASP was moderately to severely elevated at 60 mm Hg.  ?  ? outpatient cardiac cath on 04/28/2015 that showed 3 vessel CAD with patent SVG to LAD, occluded SVG to OM, native RCA had severe ISR in the mid-segment and was occluded distally at the sites of previously placed stents. There were left to right collaterals. Moderately reduced LVSF with EF of 35-40%. Mildly elevated LVEDP. There were no good targets for revascularization. medical therapy.  ?  ?. EF moderately reduced on recent echocardiogram (TTE 40-45% on 06/24/21, [facility]). She has been trialed on amiodarone in the past (d/c 03/2020). ? ? ?Past Medical History:  ?Diagnosis Date  ? Acute midline low back pain without sciatica 08/13/2016  ? Acute on chronic respiratory failure with hypoxia ([place]) 08/21/2018  ? Acute respiratory failure with hypoxia ([place]) 12/11/2017  ? Allergy   ? Aortic atherosclerosis ([place])   ? Atypical atrial flutter ([place])   ? a.) s/p ablation 07/27/2013 and 05/10/2016  ? Balance problem   ? CAD (coronary artery disease)   ? a. s/p MI x 2 in 2002 s/p PCI x 2 in 2002; b. s/p 2v CABG 2002; c. stress echo 07/2004 w/ evi of pos & inf infarct & no evi of ischemia; d. 4/08 dipyridamole scan w/ multiple areas of infarct, no ischemia, EF 49%; e. cath 04/28/15 3v CAD, med Rx rec, no targets for revasc, LM lum irregs, pLAD 30%, 100%, ost-pLCx 60%, [name] 99%, OM2 100%, p-mRCA 90%, [name] 100% L-R collats, VG-mLAD irregs, VG-OM2 oc  ? Chronic anticoagulation   ? a.) Apixaban  ? Chronic systolic CHF (congestive heart failure) ([place])   ? a. echo 03/2015: EF 30-35%, sev ant/inf/pos HK, in mild to mod MR  ? Closed fracture of right proximal humerus 05/28/2021  ?  Complication of anesthesia   ? a.) postoperative apnea. b.) postoperative hypoxia.  ? Compressed spine fracture ([place]) 08/06/2016  ? a.) L2, T11, T12  ? COPD (chronic obstructive pulmonary disease) ([place])   ? Deep vein thrombosis (DVT) of left lower extremity ([place]) 12/2001  ? Fracture of multiple pubic rami, right, closed, initial encounter ([place]) 05/27/2016  ? a.) RIGHT superior and inferior pubic rami fractures s/p mechanical fall.  ? GERD (gastroesophageal reflux disease)   ? HLD (hyperlipidemia)   ? HTN (hypertension)   ? Hypothyroidism   ? Ischemic cardiomyopathy   ? Mitral regurgitation   ? a. s/p mitral ring placement 09/2000; b. echo 09/2010: EF 50%, inf HK, post AK, mild MR, prosthetic mitral valve ring w/ peak gradient of 10 mmHg; b. echo 2/13: EF 50%, mild MR/TR     ? Myocardial infarction [facility]) 2002  ? a.) x 2 in 2002; PCI with stents (unknown type) placed to the p-mRCA, [name], and [name].  ? Neuropathy   ?  Osteoarthritis   ? Osteoporosis   ? Oxygen dependent   ? a.) 3 L/[place]  ? Pacemaker   ? a. MDT 2002; b. generator replacement 2013; c. followed by Dr. Omelia Blackwater, MD  ? PAF (paroxysmal atrial fibrillation) [facility])   ? a.) s/p DCCV on 04/08/2017. b.) on apixaban  ? Personal history of chemotherapy   ? Personal history of radiation therapy   ? Pneumonia   ? S/P CABG x 2 2002  ? a.) SVG-LAD, SVG-OM  ? Squamous cell carcinoma of right lung ([place]) 01/03/2015  ? a.) stage IIIa. b.) s/p concurrent chemotherapy (carboplatin + paclitaxel) and XRT (IMRT 6000 cGy)  ? SVT (supraventricular tachycardia) ([place])   ? Type II diabetes mellitus with complication ([place]) 32/35/5732  ? ?Past Surgical History:  ?Procedure Laterality Date  ? APPENDECTOMY    ? CARDIAC CATHETERIZATION N/A 04/28/2015  ? Procedure: Left Heart Cath and Coronary Angiography;  Surgeon: Wellington Hampshire, MD;  Location: [place] CV LAB;  Service: Cardiovascular;  Laterality: N/A;  ? CARDIAC ELECTROPHYSIOLOGY STUDY AND ABLATION N/A 05/10/2016  ? Procedure(s):  INTRACARDIAC ELECTROPHYSIOLOGIC 3D MAPPING, STIMULATION PACING HEART, ICAR CATHETER ABLATION ARRHYTHMIA ADD ON, ABLATE L/R ATRIAL FIBRIL W/ISOLATED PULM VEIN, INTRACARD ECHO, THER/DX INTERVENT; Location: [facility]; S

## 2021-11-27 NOTE — Progress Notes (Signed)
Spoke with patient 11/27/21 @ 10:05 am. Martin Majestic over pre-procedure instructions with patient including the need to arrive at 7:30 for 8:30 procedure (stressed importance of arrival time due to procedure being done with GA), the need to be NPO after midnight the night prior to the procedure, the need to hold her Eliquis with last dose being 11/28/21 and the need for a driver post procedure. Patient verbalized understanding and stated her daughter would be with her on the day of the procedure. ?

## 2021-12-01 ENCOUNTER — Other Ambulatory Visit: Payer: Self-pay | Admitting: Radiology

## 2021-12-02 ENCOUNTER — Other Ambulatory Visit: Payer: Self-pay | Admitting: Surgery

## 2021-12-02 ENCOUNTER — Encounter: Payer: Self-pay | Admitting: Radiology

## 2021-12-02 ENCOUNTER — Ambulatory Visit
Admission: RE | Admit: 2021-12-02 | Discharge: 2021-12-02 | Disposition: A | Discharge status: Home / Self Care | Payer: Medicare Other | Source: Ambulatory Visit | Attending: Surgery | Admitting: Surgery

## 2021-12-02 ENCOUNTER — Telehealth: Payer: Self-pay | Admitting: Medical

## 2021-12-02 ENCOUNTER — Encounter: Payer: Self-pay | Admitting: Anesthesiology

## 2021-12-02 ENCOUNTER — Ambulatory Visit: Payer: Self-pay | Admitting: Anesthesiology

## 2021-12-02 DIAGNOSIS — Z7901 Long term (current) use of anticoagulants: Secondary | ICD-10-CM | POA: Diagnosis not present

## 2021-12-02 DIAGNOSIS — S32030A Wedge compression fracture of third lumbar vertebra, initial encounter for closed fracture: Secondary | ICD-10-CM | POA: Diagnosis not present

## 2021-12-02 DIAGNOSIS — I5022 Chronic systolic (congestive) heart failure: Secondary | ICD-10-CM | POA: Diagnosis not present

## 2021-12-02 DIAGNOSIS — Z79890 Hormone replacement therapy (postmenopausal): Secondary | ICD-10-CM | POA: Insufficient documentation

## 2021-12-02 DIAGNOSIS — X58XXXA Exposure to other specified factors, initial encounter: Secondary | ICD-10-CM | POA: Insufficient documentation

## 2021-12-02 DIAGNOSIS — Z79899 Other long term (current) drug therapy: Secondary | ICD-10-CM | POA: Insufficient documentation

## 2021-12-02 DIAGNOSIS — I252 Old myocardial infarction: Secondary | ICD-10-CM | POA: Insufficient documentation

## 2021-12-02 DIAGNOSIS — M8008XA Age-related osteoporosis with current pathological fracture, vertebra(e), initial encounter for fracture: Secondary | ICD-10-CM

## 2021-12-02 DIAGNOSIS — S32010A Wedge compression fracture of first lumbar vertebra, initial encounter for closed fracture: Secondary | ICD-10-CM

## 2021-12-02 DIAGNOSIS — Z86718 Personal history of other venous thrombosis and embolism: Secondary | ICD-10-CM | POA: Insufficient documentation

## 2021-12-02 DIAGNOSIS — Z951 Presence of aortocoronary bypass graft: Secondary | ICD-10-CM | POA: Diagnosis not present

## 2021-12-02 DIAGNOSIS — S32050S Wedge compression fracture of fifth lumbar vertebra, sequela: Secondary | ICD-10-CM

## 2021-12-02 DIAGNOSIS — I480 Paroxysmal atrial fibrillation: Secondary | ICD-10-CM | POA: Insufficient documentation

## 2021-12-02 DIAGNOSIS — I2510 Atherosclerotic heart disease of native coronary artery without angina pectoris: Secondary | ICD-10-CM | POA: Insufficient documentation

## 2021-12-02 DIAGNOSIS — Z950 Presence of cardiac pacemaker: Secondary | ICD-10-CM | POA: Insufficient documentation

## 2021-12-02 DIAGNOSIS — Z9981 Dependence on supplemental oxygen: Secondary | ICD-10-CM | POA: Diagnosis not present

## 2021-12-02 DIAGNOSIS — J449 Chronic obstructive pulmonary disease, unspecified: Secondary | ICD-10-CM | POA: Diagnosis not present

## 2021-12-02 DIAGNOSIS — E1140 Type 2 diabetes mellitus with diabetic neuropathy, unspecified: Secondary | ICD-10-CM | POA: Insufficient documentation

## 2021-12-02 DIAGNOSIS — X58XXXS Exposure to other specified factors, sequela: Secondary | ICD-10-CM | POA: Insufficient documentation

## 2021-12-02 DIAGNOSIS — I110 Hypertensive heart disease with heart failure: Secondary | ICD-10-CM | POA: Diagnosis not present

## 2021-12-02 DIAGNOSIS — Z87891 Personal history of nicotine dependence: Secondary | ICD-10-CM | POA: Diagnosis not present

## 2021-12-02 HISTORY — PX: IR KYPHO EA ADDL LEVEL THORACIC OR LUMBAR: IMG5520

## 2021-12-02 HISTORY — PX: IR KYPHO LUMBAR INC FX REDUCE BONE BX UNI/BIL CANNULATION INC/IMAGING: IMG5519

## 2021-12-02 LAB — CBC
HCT: 40.5 % (ref 36.0–46.0)
Hemoglobin: 13.1 g/dL (ref 12.0–15.0)
MCH: 29.5 pg (ref 26.0–34.0)
MCHC: 32.3 g/dL (ref 30.0–36.0)
MCV: 91.2 fL (ref 80.0–100.0)
Platelets: 221 10*3/uL (ref 150–400)
RBC: 4.44 MIL/uL (ref 3.87–5.11)
RDW: 15.2 % (ref 11.5–15.5)
WBC: 6.6 10*3/uL (ref 4.0–10.5)
nRBC: 0 % (ref 0.0–0.2)

## 2021-12-02 LAB — PROTIME-INR
INR: 1.1 (ref 0.8–1.2)
Prothrombin Time: 14.1 seconds (ref 11.4–15.2)

## 2021-12-02 MED ORDER — PHENYLEPHRINE 40 MCG/ML (10ML) SYRINGE FOR IV PUSH (FOR BLOOD PRESSURE SUPPORT)
PREFILLED_SYRINGE | INTRAVENOUS | Status: AC
  Filled 2021-12-02: qty 10

## 2021-12-02 MED ORDER — SODIUM CHLORIDE 0.9 % IV SOLN
INTRAVENOUS | Status: DC
  Filled 2021-12-02: qty 1000

## 2021-12-02 MED ORDER — PROPOFOL 500 MG/50ML IV EMUL
INTRAVENOUS | Status: DC | PRN
  Administered 2021-12-02: 30 ug/kg/min via INTRAVENOUS

## 2021-12-02 MED ORDER — VANCOMYCIN HCL IN DEXTROSE 1-5 GM/200ML-% IV SOLN
1000.0000 mg | INTRAVENOUS | Status: AC
  Administered 2021-12-02: 1000 mg via INTRAVENOUS
  Filled 2021-12-02 (×2): qty 200

## 2021-12-02 MED ORDER — PROPOFOL 500 MG/50ML IV EMUL
INTRAVENOUS | Status: AC
  Filled 2021-12-02: qty 50

## 2021-12-02 MED ORDER — ONDANSETRON HCL 4 MG/2ML IJ SOLN
4.0000 mg | Freq: Once | INTRAMUSCULAR | Status: DC | PRN
  Filled 2021-12-02: qty 2

## 2021-12-02 MED ORDER — LIDOCAINE HCL (PF) 1 % IJ SOLN
INTRAMUSCULAR | Status: AC
  Administered 2021-12-02: 17 mL
  Filled 2021-12-02: qty 30

## 2021-12-02 MED ORDER — LIDOCAINE HCL (CARDIAC) PF 100 MG/5ML IV SOSY
PREFILLED_SYRINGE | INTRAVENOUS | Status: DC | PRN
Start: 2021-12-02 — End: 2021-12-02
  Administered 2021-12-02: 40 mg via INTRAVENOUS

## 2021-12-02 MED ORDER — ONDANSETRON HCL 4 MG/2ML IJ SOLN
INTRAMUSCULAR | Status: DC | PRN
  Administered 2021-12-02: 4 mg via INTRAVENOUS

## 2021-12-02 MED ORDER — PHENYLEPHRINE HCL (PRESSORS) 10 MG/ML IV SOLN
INTRAVENOUS | Status: AC
  Filled 2021-12-02: qty 1

## 2021-12-02 MED ORDER — KETOROLAC TROMETHAMINE 60 MG/2ML IM SOLN
INTRAMUSCULAR | Status: AC
  Filled 2021-12-02: qty 2

## 2021-12-02 MED ORDER — ACETAMINOPHEN 10 MG/ML IV SOLN
1000.0000 mg | Freq: Once | INTRAVENOUS | Status: AC
  Administered 2021-12-02: 1000 mg via INTRAVENOUS
  Filled 2021-12-02 (×2): qty 100

## 2021-12-02 MED ORDER — DEXMEDETOMIDINE (PRECEDEX) IN NS 20 MCG/5ML (4 MCG/ML) IV SYRINGE
PREFILLED_SYRINGE | INTRAVENOUS | Status: DC | PRN
Start: 2021-12-02 — End: 2021-12-02
  Administered 2021-12-02 (×3): 4 ug via INTRAVENOUS
  Administered 2021-12-02: 8 ug via INTRAVENOUS
  Administered 2021-12-02 (×2): 4 ug via INTRAVENOUS

## 2021-12-02 MED ORDER — ACETAMINOPHEN 160 MG/5ML PO SOLN
325.0000 mg | ORAL | Status: DC | PRN
  Filled 2021-12-02: qty 20.3

## 2021-12-02 MED ORDER — FENTANYL CITRATE (PF) 100 MCG/2ML IJ SOLN
INTRAMUSCULAR | Status: AC
  Filled 2021-12-02: qty 2

## 2021-12-02 MED ORDER — FENTANYL CITRATE (PF) 100 MCG/2ML IJ SOLN
INTRAMUSCULAR | Status: DC | PRN
  Administered 2021-12-02 (×4): 25 ug via INTRAVENOUS

## 2021-12-02 MED ORDER — DEXAMETHASONE SODIUM PHOSPHATE 10 MG/ML IJ SOLN
INTRAMUSCULAR | Status: DC | PRN
  Administered 2021-12-02: 5 mg via INTRAVENOUS

## 2021-12-02 MED ORDER — KETOROLAC TROMETHAMINE 30 MG/ML IJ SOLN
15.0000 mg | Freq: Once | INTRAMUSCULAR | Status: AC
Start: 2021-12-02 — End: 2021-12-02
  Administered 2021-12-02: 15 mg via INTRAMUSCULAR
  Filled 2021-12-02: qty 1

## 2021-12-02 MED ORDER — GLYCOPYRROLATE 0.2 MG/ML IJ SOLN
INTRAMUSCULAR | Status: DC | PRN
  Administered 2021-12-02: .2 mg via INTRAVENOUS

## 2021-12-02 MED ORDER — LIDOCAINE HCL (PF) 1 % IJ SOLN
INTRAMUSCULAR | Status: AC
  Administered 2021-12-02: 30 mL
  Filled 2021-12-02: qty 30

## 2021-12-02 MED ORDER — ACETAMINOPHEN 325 MG PO TABS
650.0000 mg | ORAL_TABLET | Freq: Once | ORAL | Status: DC | PRN
  Filled 2021-12-02: qty 2

## 2021-12-02 MED ORDER — PHENYLEPHRINE HCL (PRESSORS) 10 MG/ML IV SOLN
INTRAVENOUS | Status: DC | PRN
  Administered 2021-12-02: 50 ug via INTRAVENOUS
  Administered 2021-12-02: 100 ug via INTRAVENOUS
  Administered 2021-12-02: 50 ug via INTRAVENOUS
  Administered 2021-12-02: 100 ug via INTRAVENOUS
  Administered 2021-12-02 (×5): 50 ug via INTRAVENOUS
  Administered 2021-12-02: 100 ug via INTRAVENOUS

## 2021-12-02 NOTE — Transfer of Care (Signed)
Immediate Anesthesia Transfer of Care Note ? ?Patient: Jenny Cooper ? ?Procedure(s) Performed: IR KYPHO LUMBAR INC FX REDUCE BONE BX UNI/BIL CANNULATION INC/IMAGING ?IR KYPHO EA ADDL LEVEL THORACIC OR LUMBAR ? ?Patient Location: PACU and Cath Lab ? ?Anesthesia Type:General ? ?Level of Consciousness: drowsy ? ?Airway & Oxygen Therapy: Patient Spontanous Breathing and Patient connected to nasal cannula oxygen ? ?Post-op Assessment: Report given to RN ? ?Post vital signs: stable ? ?Last Vitals:  ?Vitals Value Taken Time  ?BP    ?Temp    ?Pulse 82 12/02/21 1051  ?Resp 18 12/02/21 1051  ?SpO2 99 % 12/02/21 1051  ?Vitals shown include unvalidated device data. ? ?Last Pain:  ?Vitals:  ? 12/02/21 0801  ?TempSrc: Oral  ?   ? ?  ? ?Complications: No notable events documented. ?

## 2021-12-02 NOTE — H&P (Signed)
? ?Chief Complaint: ?Patient was seen in consultation today for L1, L3, L5 compression fractures ? ?Referring Physician(s): ?Poggi,John J ? ?Supervising Physician: Jacqulynn Cadet ? ?Patient Status: [place] ? ?History of Present Illness: ?MEARL HAREWOOD is a 86 y.o. female with past medical history of CAD s/p CABG and pacemaker placement, CHF with reduced EF (40%), COPD on 2L supplemental O2, DVT on anticoagulation and previous vertebroplasty/kyphoplasty at T11, T12, L2, and L4 who presents with new fractures at L1, L3, and L5.  She has met with Dr. Denna Haggard in consultation to discuss proceeding with treatment at these new levels.  Due to debilitating pain not resolved with narcotic pain medication as well as decreased mobility related to the pain, she elects to proceed with vertebroplasty/kyphoplasty. ? ?Patient presents to [facility] for the procedure today. She is accompanied by her daughter.  She is aware of the goals of the procedure today and is agreeable to proceed.  She confirms the history above and continues to endorse limiting back pain.  She does have cardiopulmonary history such that general anesthesia team has been requested for the procedure today.  ? ?She has been NPO.  ?She has appropriately held her blood thinners.  ? ?Past Medical History:  ?Diagnosis Date  ? Acute midline low back pain without sciatica 08/13/2016  ? Acute on chronic respiratory failure with hypoxia ([place]) 08/21/2018  ? Acute respiratory failure with hypoxia ([place]) 12/11/2017  ? Allergy   ? Aortic atherosclerosis ([place])   ? Atypical atrial flutter ([place])   ? a.) s/p ablation 07/27/2013 and 05/10/2016  ? Balance problem   ? CAD (coronary artery disease)   ? a. s/p MI x 2 in 2002 s/p PCI x 2 in 2002; b. s/p 2v CABG 2002; c. stress echo 07/2004 w/ evi of pos & inf infarct & no evi of ischemia; d. 4/08 dipyridamole scan w/ multiple areas of infarct, no ischemia, EF 49%; e. cath 04/28/15 3v CAD, med Rx rec, no targets for  revasc, LM lum irregs, pLAD 30%, 100%, ost-pLCx 60%, [name] 99%, OM2 100%, p-mRCA 90%, [name] 100% L-R collats, VG-mLAD irregs, VG-OM2 oc  ? Chronic anticoagulation   ? a.) Apixaban  ? Chronic systolic CHF (congestive heart failure) ([place])   ? a. echo 03/2015: EF 30-35%, sev ant/inf/pos HK, in mild to mod MR  ? Closed fracture of right proximal humerus 05/28/2021  ? Complication of anesthesia   ? a.) postoperative apnea. b.) postoperative hypoxia.  ? Compressed spine fracture ([place]) 08/06/2016  ? a.) L2, T11, T12  ? COPD (chronic obstructive pulmonary disease) ([place])   ? Deep vein thrombosis (DVT) of left lower extremity ([place]) 12/2001  ? Fracture of multiple pubic rami, right, closed, initial encounter ([place]) 05/27/2016  ? a.) RIGHT superior and inferior pubic rami fractures s/p mechanical fall.  ? GERD (gastroesophageal reflux disease)   ? HLD (hyperlipidemia)   ? HTN (hypertension)   ? Hypothyroidism   ? Ischemic cardiomyopathy   ? Mitral regurgitation   ? a. s/p mitral ring placement 09/2000; b. echo 09/2010: EF 50%, inf HK, post AK, mild MR, prosthetic mitral valve ring w/ peak gradient of 10 mmHg; b. echo 2/13: EF 50%, mild MR/TR     ? Myocardial infarction [facility]) 2002  ? a.) x 2 in 2002; PCI with stents (unknown type) placed to the p-mRCA, [name], and [name].  ? Neuropathy   ? Osteoarthritis   ? Osteoporosis   ? Oxygen dependent   ? a.) 3  L/[place]  ? Pacemaker   ? a. MDT 2002; b. generator replacement 2013; c. followed by Dr. Omelia Blackwater, MD  ? PAF (paroxysmal atrial fibrillation) [facility])   ? a.) s/p DCCV on 04/08/2017. b.) on apixaban  ? Personal history of chemotherapy   ? Personal history of radiation therapy   ? Pneumonia   ? S/P CABG x 2 2002  ? a.) SVG-LAD, SVG-OM  ? Squamous cell carcinoma of right lung ([place]) 01/03/2015  ? a.) stage IIIa. b.) s/p concurrent chemotherapy (carboplatin + paclitaxel) and XRT (IMRT 6000 cGy)  ? SVT (supraventricular tachycardia) ([place])   ? Type II diabetes mellitus with complication ([place])  16/05/9603  ? ? ?Past Surgical History:  ?Procedure Laterality Date  ? APPENDECTOMY    ? CARDIAC CATHETERIZATION N/A 04/28/2015  ? Procedure: Left Heart Cath and Coronary Angiography;  Surgeon: Wellington Hampshire, MD;  Location: [place] CV LAB;  Service: Cardiovascular;  Laterality: N/A;  ? CARDIAC ELECTROPHYSIOLOGY STUDY AND ABLATION N/A 05/10/2016  ? Procedure(s): INTRACARDIAC ELECTROPHYSIOLOGIC 3D MAPPING, STIMULATION PACING HEART, ICAR CATHETER ABLATION ARRHYTHMIA ADD ON, ABLATE L/R ATRIAL FIBRIL W/ISOLATED PULM VEIN, INTRACARD ECHO, THER/DX INTERVENT; Location: [facility]; Surgeon: Norm Salt, MD  ? CARDIOVERSION N/A 04/08/2017  ? Procedure: CARDIOVERSION;  Surgeon: Wellington Hampshire, MD;  Location: [facility] ORS;  Service: Cardiovascular;  Laterality: N/A;  ? CHEST TUBE INSERTION N/A 07/11/2018  ? Procedure: PLEURX CATH INSERTION;  Surgeon: [name], MD;  Location: [facility];  Service: Thoracic;  Laterality: N/A;  ? COLONOSCOPY  12/2009  ? 2 small tubular adenomas  ? CORONARY ANGIOPLASTY WITH STENT PLACEMENT Left 2002  ? s/p MI x 2 with subsequent PCI x 2 ; stents (unknown type) placed to the p-mRCA, [name], and [name].  ? CORONARY ARTERY BYPASS GRAFT  09/2000  ? DRAIN REMOVAL Right 08/10/2018  ? Procedure: DRAIN REMOVAL;  Surgeon: [name], MD;  Location: [facility];  Service: General;  Laterality: Right;  ? ECTOPIC PREGNANCY SURGERY    ? ELECTROMAGNETIC NAVIGATION BROCHOSCOPY N/A 10/06/2015  ? Procedure: ELECTROMAGNETIC NAVIGATION BRONCHOSCOPY;  Surgeon: Flora Lipps, MD;  Location: [facility];  Service: Cardiopulmonary;  Laterality: N/A;  ? ENDOBRONCHIAL ULTRASOUND N/A 10/06/2015  ? Procedure: ENDOBRONCHIAL ULTRASOUND;  Surgeon: Flora Lipps, MD;  Location: [facility];  Service: Cardiopulmonary;  Laterality: N/A;  ? EYE SURGERY    ? FLEXIBLE BRONCHOSCOPY Right 07/11/2018  ? Procedure: FLEXIBLE BRONCHOSCOPY;  Surgeon: [name], MD;  Location: [facility];  Service: Thoracic;  Laterality: Right;  ? IR  RADIOLOGIST EVAL & MGMT  11/12/2021  ? KYPHOPLASTY N/A 09/28/2016  ? Procedure: KYPHOPLASTY;  Surgeon: Hessie Knows, MD;  Location: [facility];  Service: Orthopedics;  Laterality: N/A;  ? KYPHOPLASTY N/A 02/20/2018  ? Procedure: [id number];  Surgeon: Hessie Knows, MD;  Location: [facility];  Service: Orthopedics;  Laterality: N/A;  ? PACEMAKER INSERTION  03/2012  ? REVERSE SHOULDER ARTHROPLASTY Right 06/02/2021  ? Procedure: REVERSE SHOULDER ARTHROPLASTY;  Surgeon: Corky Mull, MD;  Location: [facility];  Service: Orthopedics;  Laterality: Right;  ? SVT ABLATION N/A 07/27/2013  ? Procedure: SVT ABLATION; Location: [facility]; Surgeon: Rolla Etienne, MD  ? thorocentesis  12/24/2016  ? VAGINAL HYSTERECTOMY    ? partial - left ovary remains  ? VIDEO ASSISTED THORACOSCOPY Right 07/11/2018  ? Procedure: VIDEO ASSISTED THORACOSCOPY;  Surgeon: [name], MD;  Location: [facility];  Service: Thoracic;  Laterality: Right;  ? VIDEO ASSISTED THORACOSCOPY (VATS) W/TALC PLEUADESIS Right 07/11/2018  ? Procedure: THORACOTOMY PLEURAL BIOPSY WITH TALC  PLEURODESIS;  Surgeon: [name], MD;  Location: [facility];  Service: Thoracic;  Laterality: Right;  pleural biopsy  ? ? ?Allergies: ?Cefdinir, Lovenox [enoxaparin sodium], and Meperidine ? ?Medications: ?Prior to Admission medications   ?Medication Sig Start Date End Date Taking? Authorizing Provider  ?acetaminophen (TYLENOL) 500 MG tablet Take 500-1,000 mg by mouth every 4 (four) hours as needed for mild pain or moderate pain.   Yes [provider]  ?apixaban (ELIQUIS) 5 MG TABS tablet TAKE (1) TABLET BY MOUTH TWICE DAILY 08/13/21  Yes Gollan, Kathlene November, MD  ?baclofen (LIORESAL) 10 MG tablet Take 1 tablet (10 mg total) by mouth 3 (three) times daily. 10/07/21  Yes Glean Hess, MD  ?budesonide (PULMICORT) 0.5 MG/2ML nebulizer solution INHALE 2 MILLILITERS BY NEBULIZER 2 TIMES DAILY 07/17/21  Yes Flora Lipps, MD  ?cetirizine (ZYRTEC) 10 MG tablet TAKE (1) TABLET BY  MOUTH EVERY DAY 06/15/18  Yes Glean Hess, MD  ?fluticasone (FLONASE) 50 MCG/ACT nasal spray Place 2 sprays into both nostrils daily. 01/23/20  Yes Glean Hess, MD  ?furosemide (LASIX) 20 MG tablet Take 1 tablet (

## 2021-12-02 NOTE — Procedures (Signed)
Interventional Radiology Procedure Note ? ?Procedure: Three level kyphoplasty targeting L1, L3 and L5. ? ?Complications: None ? ?Estimated Blood Loss: <25 mL ? ?Recommendations: ?- Bedrest x 1 hr ?- DC in 2 hrs ?- IV tylenol ? ? ?Signed, ? ?Criselda Peaches, MD ? ? ?

## 2021-12-02 NOTE — Discharge Instructions (Addendum)
Kyphoplasty, Care After ? ?This sheet gives you information about how to care for yourself after your procedure. Your health care provider may also give you more specific instructions. If you have problems or questions, contact your health care provider. ? ?What can I expect after the procedure? ? ?After the procedure, it is common to have back pain. ?Follow these instructions at home: ? ?Medicines ?Take over-the-counter and prescription medicines only as told by your health care provider. ?Ask your health care provider if the medicine prescribed to you: ?Requires you to avoid driving or using machinery. ?Can cause constipation. You may need to take these actions to prevent or treat constipation: ?Drink enough fluid to keep your urine pale yellow. ?Take over-the-counter or prescription medicines. ?Eat foods that are high in fiber, such as beans, whole grains, and fresh fruits and vegetables. ?Limit foods that are high in fat and processed sugars, such as fried or sweet foods. ? ?Puncture site care ?Follow instructions from your health care provider about how to take care of your puncture site. Make sure you: ?Wash your hands with soap and water for at least 20 seconds before and after you change your bandage (dressing). If soap and water are not available, use hand sanitizer. ?Change your dressing as told by your health care provider. ?Leave skin glue or adhesive strips in place. These skin closures may need to be in place for 2 weeks or longer. If adhesive strip edges start to loosen and curl up, you may trim the loose edges. Do not remove adhesive strips completely unless your health care provider tells you to do that. ?Check your puncture site every day for signs of infection. Check for: ?Redness, swelling, or pain. ?Fluid or blood. ?Warmth. ?Pus or a bad smell. ?Keep your dressing dry until your health care provider says that it can be removed. ? ?Managing pain, stiffness, and swelling ?If directed, put ice on  the painful area. To do this: ?Put ice in a plastic bag. ?Place a towel between your skin and the bag. ?Leave the ice on for 20 minutes, 2-3 times a day. ?Remove the ice if your skin turns bright red. This is very important. If you cannot feel pain, heat, or cold, you have a greater risk of damage to the area. ?  ?Activity ?Rest your back and avoid intense physical activity for as long as told by your health care provider. ?Avoid bending, lifting, or twisting your back for as long as told by your health care provider. ?Return to your normal activities as told by your health care provider. Ask your health care provider what activities are safe for you. ?Do not lift anything that is heavier than 5 lb (2.2 kg). You may need to avoid heavy lifting for several weeks. ? ?General instructions ?Do not use any products that contain nicotine or tobacco, such as cigarettes, e-cigarettes, and chewing tobacco. These can delay bone healing. If you need help quitting, ask your health care provider. ?If you were given a sedative during the procedure, it can affect you for several hours. Do not drive or operate machinery until your health care provider says that it is safe. ?Keep all follow-up visits. This is important. ? ?Contact a health care provider if: ?You have a fever or chills. ?You have redness, swelling, or pain at the site of your puncture. ?You have fluid, blood, or pus coming from the puncture site. ?You have pain that gets worse or does not get better with medicine. ?You develop  numbness or weakness in any part of your body. ? ?Get help right away if: ?You have chest pain. ?You have difficulty breathing. ?You have weakness, numbness, or tingling in your legs. ?You cannot control your bladder or bowel movements (incontinence). ?You suddenly become weak or numb on one side of your body. ?You become very confused. ?You have trouble speaking or understanding, or both. ? ?These symptoms may represent a serious problem that  is an emergency. Do not wait to see if the symptoms will go away. Get medical help right away. Call your local emergency services (911 in the [place] Do not drive yourself to the hospital. ?Summary ?Follow instructions from your health care provider about how to take care of your puncture site. ?Take over-the-counter and prescription medicines only as told by your health care provider. ?Rest your back and avoid intense physical activity for as long as told by your health care provider. ?Contact a health care provider if you have pain that gets worse or does not get better with medicine. ?Keep all follow-up visits. This is important. ? ?This information is not intended to replace advice given to you by your health care provider. Make sure you discuss any questions you have with your health care provider. ?Kyphoplasty, Care After ?This sheet gives you information about how to care for yourself after your procedure. Your health care provider may also give you more specific instructions. If you have problems or questions, contact your health care provider. ?What can I expect after the procedure? ?After the procedure, it is common to have back pain. ?Follow these instructions at home: ?Medicines and Diet ?Take over-the-counter and prescription medicines only as told by your health care provider. ?Take over-the-counter or prescription medicines that you normally take.  However, if you are taking Aspirin or an anticoagulant/blood thinner you will be told when you can resume taking these by the healthcare provider.  ?You may resume a regular diet ?Eat foods that are high in fiber, such as beans, whole grains, and fresh fruits and vegetables. ?Limit foods that are high in fat and processed sugars, such as fried or sweet foods. ?Puncture site care ?  ?You may remove bandaid tomorrow after taking a shower.  Replace daily with a clean bandaid until healed. ?Check your puncture site every day for signs of infection. Check  for: ?Redness, swelling, or pain. ?Fluid or blood. ?Warmth. ?Pus or a bad smell. ?  ?Managing pain, stiffness, and swelling ?If directed, put ice on the painful area. To do this: ?You may use an ice pack as needed to the injection sites on your back.  Ice to the site 20 minutes on and 20 minutes off, as needed. ?Place a towel between your skin and the bag. ?  ?Activity ?No driving or operating machinery for 24 hours after your procedure. ?Do not lift anything that is heavier than a milk jug for 1 to 2 weeks or determined by your physician.Marland Kitchen  ?Follow up with your physician in 2 weeks.  ?Contact a health care provider if: ?You have a fever greater than 100 degrees ?You have redness, swelling, or pain at the site of your puncture. ?You have fluid, blood, or pus coming from the puncture site. ?You have pain that gets worse or does not get better with medicine. ?You develop numbness or weakness in any part of your body. ?Get help right away if: ?You have chest pain. ?You have difficulty breathing. ?You have weakness, numbness, or tingling in your legs. ?You cannot  control your bladder or bowel movements (incontinence). ?You suddenly become weak or numb on one side of your body. ?You become very confused. ?You have trouble speaking or understanding, or both. ?These symptoms may represent a serious problem that is an emergency. Do not wait to see if the symptoms will go away. Get medical help right away. Call your local emergency services (911 in the [place] Do not drive yourself to the hospital. ?Summary ?Follow instructions from your health care provider about how to take care of your puncture site. ?Take over-the-counter and prescription medicines only as told by your health care provider. ?Rest your back and avoid intense physical activity for as long as told by your health care provider. ?Contact a health care provider if you have pain that gets worse or does not get better with medicine. ?Keep all follow-up visits.  This is important. ?This information is not intended to replace advice given to you by your health care provider. Make sure you discuss any questions you have with your health care provider. ?Document Revised:

## 2021-12-02 NOTE — Anesthesia Preprocedure Evaluation (Signed)
Anesthesia Evaluation  ?Patient identified by MRN, date of birth, ID band ?Patient awake ? ? ? ?Reviewed: ?Allergy & Precautions, NPO status , Patient's Chart, lab work & pertinent test results ? ?History of Anesthesia Complications ?Negative for: history of anesthetic complications ? ?Airway ?Mallampati: III ? ? ? ? ? ? Dental ? ?(+) Upper Dentures, Lower Dentures ?  ?Pulmonary ?COPD (on 3L home O2), former smoker (quit 2001),  ?Lung CA s/p chemo and radiation ?  ?+ rhonchi ? ? ? ? ? ? ? Cardiovascular ?hypertension, + CAD (s/p MI and CABG) and +CHF (ischemic cardiomyopathy)  ?Normal cardiovascular exam+ dysrhythmias (a fib on Eliquis, last dose 11/29/21) + pacemaker + Valvular Problems/Murmurs (s/p MVR 2002)  ?Rhythm:Regular Rate:Normal ? ?ECG 11/25/21: Indeterminate rhythm given low P wave amplitude, narrow complex, 92 bpm ? ?Echo 10/27/21: Markedly elevated right heart pressures  ?MODERATE LV DYSFUNCTION, EF 40-45% (no change from prior) ???MILD RV SYSTOLIC DYSFUNCTION   ???VALVULAR REGURGITATION: TRIVIAL AR, MILD MR, MILD TR  ???PROSTHETIC VALVE(S): PROSTHETIC MV RING ?  ?Neuro/Psych ?  ? GI/Hepatic ?GERD  ,  ?Endo/Other  ?Hypothyroidism  ? Renal/GU ?  ? ?  ?Musculoskeletal ? ?(+) Arthritis ,  ? Abdominal ?  ?Peds ? Hematology ?  ?Anesthesia Other Findings ?Cardiology note 11/25/21:  ?Preop cardiovascular evaluation for kyphoplasty ?Ok to hold eliquis 3 days prior to procedure ?No further cardiac testing ?Chronic respiratory failure in the setting of underlying COPD, pulmonary hypertension ?Would moderate any IV fluids perioperatively ?? ?Atrial flutter/atrial fibrillation ?Followed by [facility] ?No significant improvement in ejection fraction on higher dose metoprolol ?-It was discussed with her that mildly depressed ejection fraction will likely persist as long as she remains in atrial fibrillation ?-Sedentary, on oxygen, likely minimally symptomatic from her atrial fibrillation  though likely a contributor to her heart failure symptoms/pulmonary hypertension ?-She will monitor blood pressure at home , and call our office for orthostasis.  BP recently increased metoprolol succinate up to 75 twice daily, she is not checking pressures but will start to do so ?- on Eliquis ?? ?Pulmonary hypertension ?Moderate elevated heart pressures on echo July 2021 ?echo October 2022 moderately elevated right heart pressures with dilated IVC ?Echocardiogram last month at outside facility with severely elevated right heart pressures (not detailed in final impression) ?Taking Lasix 20 mg twice daily, on chronic oxygen in the setting of severe underlying lung disease, few options for advanced therapies ?? ?Hyperlipidemia - ?Cholesterol is at goal on the current lipid regimen. No changes to the medications were made. ?? ?Chronic diastolic CHF ?Continue Lasix twice a day ?Stable BMP ?? ?DM type 2, goal A1c below 7 ?Medically managed ?? ?COPD exacerbation ([place] ?Prior history of acute on chronic bronchitis ?Previously treated with prednisone, and antibiotics, no recent exacerbation ?? ?Chronic back pain ?Management per orthopedics ?Scheduled for kyphoplasty ?? ? Total encounter time more than 40 minutes ? Greater than 50% was spent in counseling and coordination of care with the patient ?? ?Signed, ?Ida Rogue, MD  ?11/25/2021 12:24 PM ? Reproductive/Obstetrics ? ?  ? ? ? ? ? ? ? ? ? ? ? ? ? ?  ?  ? ? ? ? ? ? ? ? ?Anesthesia Physical ?Anesthesia Plan ? ?ASA: 4 ? ?Anesthesia Plan: General  ? ?Post-op Pain Management:   ? ?Induction: Intravenous ? ?PONV Risk Score and Plan: 3 and Propofol infusion, TIVA and Treatment may vary due to age or medical condition ? ?Airway Management Planned: Natural Airway ? ?Additional Equipment:  ? ?  Intra-op Plan:  ? ?Post-operative Plan:  ? ?Informed Consent: I have reviewed the patients History and Physical, chart, labs and discussed the procedure including the risks, benefits and  alternatives for the proposed anesthesia with the patient or authorized representative who has indicated his/her understanding and acceptance.  ? ? ? ? ? ?Plan Discussed with: CRNA ? ?Anesthesia Plan Comments: (LMA/GETA backup discussed.  Patient consented for risks of anesthesia including but not limited to:  ?- adverse reactions to medications ?- damage to eyes, teeth, lips or other oral mucosa ?- nerve damage due to positioning  ?- sore throat or hoarseness ?- damage to heart, brain, nerves, lungs, other parts of body or loss of life ? ?Informed patient about role of CRNA in peri- and intra-operative care.  Patient voiced understanding.)  ? ? ? ? ? ? ?Anesthesia Quick Evaluation ? ?

## 2021-12-02 NOTE — Progress Notes (Signed)
Patient clinically stable post L1,3,5 Kyphoplasty per DR Laurence Ferrari, tolerated well with anesthesia. Daughter at bedside with update given per DR Laurence Ferrari. Vitals stable. Denies complaints at this time. Report given to [facility] Rn post procedure/specials. ?

## 2021-12-02 NOTE — Telephone Encounter (Signed)
Lmov .   ? ?Patient had old appt in May that is no longer needed. LMOV to notify cancelled and to fu as scheduled in June .  ?

## 2021-12-03 NOTE — Anesthesia Postprocedure Evaluation (Signed)
Anesthesia Post Note ? ?Patient: DEIDREA GAETZ ? ?Procedure(s) Performed: IR KYPHO LUMBAR INC FX REDUCE BONE BX UNI/BIL CANNULATION INC/IMAGING ?IR KYPHO EA ADDL LEVEL THORACIC OR LUMBAR ? ?Patient location during evaluation: Specials Recovery ?Anesthesia Type: General ?Level of consciousness: awake and alert ?Pain management: pain level controlled ?Vital Signs Assessment: post-procedure vital signs reviewed and stable ?Respiratory status: spontaneous breathing, nonlabored ventilation and respiratory function stable ?Cardiovascular status: blood pressure returned to baseline and stable ?Postop Assessment: no apparent nausea or vomiting ?Anesthetic complications: no ? ? ?No notable events documented. ? ? ?Last Vitals:  ?Vitals:  ? 12/02/21 1245 12/02/21 1300  ?BP:    ?Pulse: 77 78  ?Resp: 15   ?Temp:    ?SpO2: 91% 94%  ?  ?Last Pain:  ?Vitals:  ? 12/02/21 1300  ?TempSrc:   ?PainSc: 1   ? ? ?  ?  ?  ?  ?  ?  ? ?[place] Ouch ? ? ? ? ?

## 2021-12-07 ENCOUNTER — Ambulatory Visit: Payer: Medicare Other | Admitting: Cardiovascular Disease

## 2021-12-08 ENCOUNTER — Other Ambulatory Visit: Payer: Self-pay | Admitting: Internal Medicine

## 2021-12-08 ENCOUNTER — Other Ambulatory Visit: Payer: Self-pay | Admitting: Cardiovascular Disease

## 2021-12-09 ENCOUNTER — Encounter: Payer: Self-pay | Admitting: Internal Medicine

## 2021-12-09 ENCOUNTER — Telehealth: Payer: Self-pay | Admitting: Internal Medicine

## 2021-12-09 ENCOUNTER — Other Ambulatory Visit: Payer: Self-pay | Admitting: Interventional Radiology

## 2021-12-09 DIAGNOSIS — S32010A Wedge compression fracture of first lumbar vertebra, initial encounter for closed fracture: Secondary | ICD-10-CM

## 2021-12-09 DIAGNOSIS — J449 Chronic obstructive pulmonary disease, unspecified: Secondary | ICD-10-CM

## 2021-12-10 NOTE — Telephone Encounter (Signed)
Jenny Cooper daughter checking on order for portalble oxygen concentrator. Jenny Cooper phone number is [phone]. ?

## 2021-12-10 NOTE — Telephone Encounter (Signed)
Called patient's daughter, Asencion Partridge ([place]), she states they are traveling next week and are using a company called [employer] to go to rent a POC. They need an order faxed to them [phone] stating that patient requires oxygen to travel and how much oxygen.  I advised that I will put in the order and fax it today.  Nothing further needed. ?

## 2022-01-05 ENCOUNTER — Ambulatory Visit
Admission: RE | Admit: 2022-01-05 | Discharge: 2022-01-05 | Disposition: A | Discharge status: Home / Self Care | Payer: Medicare Other | Source: Ambulatory Visit | Attending: Interventional Radiology | Admitting: Interventional Radiology

## 2022-01-05 ENCOUNTER — Other Ambulatory Visit (HOSPITAL_COMMUNITY): Payer: Self-pay | Admitting: Radiology

## 2022-01-05 DIAGNOSIS — S32010A Wedge compression fracture of first lumbar vertebra, initial encounter for closed fracture: Secondary | ICD-10-CM

## 2022-01-05 HISTORY — PX: IR RADIOLOGIST EVAL & MGMT: IMG5224

## 2022-01-05 MED ORDER — CYCLOBENZAPRINE HCL 5 MG PO TABS: 5.0000 mg | ORAL_TABLET | Freq: Three times a day (TID) | ORAL | 0 refills | Status: DC

## 2022-01-05 NOTE — Progress Notes (Signed)
? ? ?Chief Complaint: ?Patient was consulted remotely today (TeleHealth) for multiple lumbar compression fractures at the request of Johnchristopher Sarvis K.   ? ?Referring Physician(s): ?Poggi, Marshall Cork, MD ? ?History of Present Illness: ?Jenny Cooper is a 86 y.o. female initially seen on 11/12/2021 by my partner, Dr. Murrell Redden El-Abd, for symptomatic acute L1, L3 and L5 compression fractures.  She had previously undergone cement augmentation at T11, T12, L2 and L4.  I was able to perform her treatment on 12/02/2021.  We spoke over the telephone today for her initial follow-up evaluation. ? ?Overall, she is doing somewhat better.  She says that she is just less than 50% better than she was prior to her interventions.  While her back pain at the fracture site seems to be significantly better, she does have persistent cramping pain in the paraspinal muscles which she strongly feels is something else and more like a muscle spasm.  She denies lower extremity weakness, paresthesias or changes in bowel or bladder habits.  Overall, she is happy with her progress but would still like further relief from the muscular cramping. ? ? ? ?Past Medical History:  ?Diagnosis Date  ? Acute midline low back pain without sciatica 08/13/2016  ? Acute on chronic respiratory failure with hypoxia ([place]) 08/21/2018  ? Acute respiratory failure with hypoxia ([place]) 12/11/2017  ? Allergy   ? Aortic atherosclerosis ([place])   ? Atypical atrial flutter ([place])   ? a.) s/p ablation 07/27/2013 and 05/10/2016  ? Balance problem   ? CAD (coronary artery disease)   ? a. s/p MI x 2 in 2002 s/p PCI x 2 in 2002; b. s/p 2v CABG 2002; c. stress echo 07/2004 w/ evi of pos & inf infarct & no evi of ischemia; d. 4/08 dipyridamole scan w/ multiple areas of infarct, no ischemia, EF 49%; e. cath 04/28/15 3v CAD, med Rx rec, no targets for revasc, LM lum irregs, pLAD 30%, 100%, ost-pLCx 60%, [name] 99%, OM2 100%, p-mRCA 90%, [name] 100% L-R collats, VG-mLAD irregs, VG-OM2 oc   ? Chronic anticoagulation   ? a.) Apixaban  ? Chronic systolic CHF (congestive heart failure) ([place])   ? a. echo 03/2015: EF 30-35%, sev ant/inf/pos HK, in mild to mod MR  ? Closed fracture of right proximal humerus 05/28/2021  ? Complication of anesthesia   ? a.) postoperative apnea. b.) postoperative hypoxia.  ? Compressed spine fracture ([place]) 08/06/2016  ? a.) L2, T11, T12  ? COPD (chronic obstructive pulmonary disease) ([place])   ? Deep vein thrombosis (DVT) of left lower extremity ([place]) 12/2001  ? Fracture of multiple pubic rami, right, closed, initial encounter ([place]) 05/27/2016  ? a.) RIGHT superior and inferior pubic rami fractures s/p mechanical fall.  ? GERD (gastroesophageal reflux disease)   ? HLD (hyperlipidemia)   ? HTN (hypertension)   ? Hypothyroidism   ? Ischemic cardiomyopathy   ? Mitral regurgitation   ? a. s/p mitral ring placement 09/2000; b. echo 09/2010: EF 50%, inf HK, post AK, mild MR, prosthetic mitral valve ring w/ peak gradient of 10 mmHg; b. echo 2/13: EF 50%, mild MR/TR     ? Myocardial infarction [facility]) 2002  ? a.) x 2 in 2002; PCI with stents (unknown type) placed to the p-mRCA, [name], and [name].  ? Neuropathy   ? Osteoarthritis   ? Osteoporosis   ? Oxygen dependent   ? a.) 3 L/[place]  ? Pacemaker   ? a. MDT 2002; b. generator replacement 2013; c. followed  by Dr. Omelia Blackwater, MD  ? PAF (paroxysmal atrial fibrillation) [facility])   ? a.) s/p DCCV on 04/08/2017. b.) on apixaban  ? Personal history of chemotherapy   ? Personal history of radiation therapy   ? Pneumonia   ? S/P CABG x 2 2002  ? a.) SVG-LAD, SVG-OM  ? Squamous cell carcinoma of right lung ([place]) 01/03/2015  ? a.) stage IIIa. b.) s/p concurrent chemotherapy (carboplatin + paclitaxel) and XRT (IMRT 6000 cGy)  ? SVT (supraventricular tachycardia) ([place])   ? Type II diabetes mellitus with complication ([place]) 03/83/3383  ? ? ?Past Surgical History:  ?Procedure Laterality Date  ? APPENDECTOMY    ? CARDIAC CATHETERIZATION N/A 04/28/2015  ? Procedure:  Left Heart Cath and Coronary Angiography;  Surgeon: Wellington Hampshire, MD;  Location: [facility];  Service: Cardiovascular;  Laterality: N/A;  ? CARDIAC ELECTROPHYSIOLOGY STUDY AND ABLATION N/A 05/10/2016  ? Procedure(s): INTRACARDIAC ELECTROPHYSIOLOGIC 3D MAPPING, STIMULATION PACING HEART, ICAR CATHETER ABLATION ARRHYTHMIA ADD ON, ABLATE L/R ATRIAL FIBRIL W/ISOLATED PULM VEIN, INTRACARD ECHO, THER/DX INTERVENT; Location: [facility]; Surgeon: Norm Salt, MD  ? CARDIOVERSION N/A 04/08/2017  ? Procedure: CARDIOVERSION;  Surgeon: Wellington Hampshire, MD;  Location: [facility];  Service: Cardiovascular;  Laterality: N/A;  ? CHEST TUBE INSERTION N/A 07/11/2018  ? Procedure: PLEURX CATH INSERTION;  Surgeon: [name], MD;  Location: [facility];  Service: Thoracic;  Laterality: N/A;  ? COLONOSCOPY  12/2009  ? 2 small tubular adenomas  ? CORONARY ANGIOPLASTY WITH STENT PLACEMENT Left 2002  ? s/p MI x 2 with subsequent PCI x 2 ; stents (unknown type) placed to the p-mRCA, [name], and [name].  ? CORONARY ARTERY BYPASS GRAFT  09/2000  ? DRAIN REMOVAL Right 08/10/2018  ? Procedure: DRAIN REMOVAL;  Surgeon: [name], MD;  Location: [facility];  Service: General;  Laterality: Right;  ? ECTOPIC PREGNANCY SURGERY    ? ELECTROMAGNETIC NAVIGATION BROCHOSCOPY N/A 10/06/2015  ? Procedure: ELECTROMAGNETIC NAVIGATION BRONCHOSCOPY;  Surgeon: Flora Lipps, MD;  Location: [facility];  Service: Cardiopulmonary;  Laterality: N/A;  ? ENDOBRONCHIAL ULTRASOUND N/A 10/06/2015  ? Procedure: ENDOBRONCHIAL ULTRASOUND;  Surgeon: Flora Lipps, MD;  Location: [facility];  Service: Cardiopulmonary;  Laterality: N/A;  ? EYE SURGERY    ? FLEXIBLE BRONCHOSCOPY Right 07/11/2018  ? Procedure: FLEXIBLE BRONCHOSCOPY;  Surgeon: [name], MD;  Location: [facility];  Service: Thoracic;  Laterality: Right;  ? IR KYPHO EA ADDL LEVEL THORACIC OR LUMBAR  12/02/2021  ? IR KYPHO EA ADDL LEVEL THORACIC OR LUMBAR  12/02/2021  ? IR KYPHO LUMBAR INC FX REDUCE BONE BX UNI/BIL  CANNULATION INC/IMAGING  12/02/2021  ? IR RADIOLOGIST EVAL & MGMT  11/12/2021  ? IR RADIOLOGIST EVAL & MGMT  01/05/2022  ? KYPHOPLASTY N/A 09/28/2016  ? Procedure: KYPHOPLASTY;  Surgeon: Hessie Knows, MD;  Location: [facility];  Service: Orthopedics;  Laterality: N/A;  ? KYPHOPLASTY N/A 02/20/2018  ? Procedure: [id number]-K5;  Surgeon: Hessie Knows, MD;  Location: [facility];  Service: Orthopedics;  Laterality: N/A;  ? PACEMAKER INSERTION  03/2012  ? REVERSE SHOULDER ARTHROPLASTY Right 06/02/2021  ? Procedure: REVERSE SHOULDER ARTHROPLASTY;  Surgeon: Corky Mull, MD;  Location: [facility];  Service: Orthopedics;  Laterality: Right;  ? SVT ABLATION N/A 07/27/2013  ? Procedure: SVT ABLATION; Location: [facility]; Surgeon: Rolla Etienne, MD  ? thorocentesis  12/24/2016  ? VAGINAL HYSTERECTOMY    ? partial - left ovary remains  ? VIDEO ASSISTED THORACOSCOPY Right 07/11/2018  ? Procedure: VIDEO ASSISTED THORACOSCOPY;  Surgeon: [name], MD;  Location: [facility];  Service: Thoracic;  Laterality: Right;  ? VIDEO ASSISTED THORACOSCOPY (VATS) W/TALC PLEUADESIS Right 07/11/2018  ? Procedure: THORACOTOMY PLEURAL BIOPSY WITH TALC PLEURODESIS;  Surgeon: [name], MD;  Location: [facility];  Service: Thoracic;  Laterality: Right;  pleural biopsy  ? ? ?Allergies: ?Cefdinir, Lovenox [enoxaparin sodium], and Meperidine ? ?Medications: ?Prior to Admission medications   ?Medication Sig Start Date End Date Taking? Authorizing Provider  ?acetaminophen (TYLENOL) 500 MG tablet Take 500-1,000 mg by mouth every 4 (four) hours as needed for mild pain or moderate pain.    [provider]  ?apixaban (ELIQUIS) 5 MG TABS tablet TAKE (1) TABLET BY MOUTH TWICE DAILY 08/13/21   Minna Merritts, MD  ?baclofen (LIORESAL) 10 MG tablet Take 1 tablet (10 mg total) by mouth 3 (three) times daily. 10/07/21   Glean Hess, MD  ?budesonide (PULMICORT) 0.5 MG/2ML nebulizer solution INHALE 2 MILLILITERS BY NEBULIZER 2 TIMES DAILY 07/17/21   Flora Lipps, MD  ?cetirizine (ZYRTEC) 10 MG tablet TAKE (1) TABLET BY MOUTH EVERY DAY 06/15/18   Glean Hess, MD  ?cyclobenzaprine (FLEXERIL) 5 MG tablet Take 1 tablet (5 mg total) by mouth 3 (three) t

## 2022-02-22 ENCOUNTER — Other Ambulatory Visit: Payer: Self-pay | Admitting: Cardiovascular Disease

## 2022-02-22 NOTE — Telephone Encounter (Signed)
Prescription refill request for Eliquis received. Indication:Aflutter Last office visit:3/23 Scr:1.0 Age: 86 Weight:61.2 kg  Prescription refilled

## 2022-03-04 ENCOUNTER — Telehealth: Payer: Self-pay

## 2022-03-04 NOTE — Telephone Encounter (Signed)
According to patient's chart, she had a consult with Dr. Lanney Gins 04/2022. Called patient, who stated that she wanted to keep scheduled appt with [facility] tomorrow.  Will close encounter, as nothing further is needed.

## 2022-03-05 ENCOUNTER — Other Ambulatory Visit
Admission: RE | Admit: 2022-03-05 | Discharge: 2022-03-05 | Disposition: A | Discharge status: Home / Self Care | Payer: Medicare Other | Source: Home / Self Care | Attending: Primary Care | Admitting: Primary Care

## 2022-03-05 ENCOUNTER — Encounter: Payer: Self-pay | Admitting: Primary Care

## 2022-03-05 ENCOUNTER — Ambulatory Visit
Admission: RE | Admit: 2022-03-05 | Discharge: 2022-03-05 | Disposition: A | Discharge status: Home / Self Care | Payer: Medicare Other | Attending: Primary Care | Admitting: Primary Care

## 2022-03-05 ENCOUNTER — Ambulatory Visit
Admission: RE | Admit: 2022-03-05 | Discharge: 2022-03-05 | Disposition: A | Discharge status: Home / Self Care | Payer: Medicare Other | Source: Ambulatory Visit | Attending: Primary Care | Admitting: Primary Care

## 2022-03-05 ENCOUNTER — Ambulatory Visit (INDEPENDENT_AMBULATORY_CARE_PROVIDER_SITE_OTHER): Payer: Medicare Other | Admitting: Primary Care

## 2022-03-05 VITALS — BP 102/56 | HR 89 | Temp 97.9°F | Ht 66.0 in | Wt 130.2 lb

## 2022-03-05 DIAGNOSIS — J449 Chronic obstructive pulmonary disease, unspecified: Secondary | ICD-10-CM | POA: Diagnosis present

## 2022-03-05 LAB — CBC WITH DIFFERENTIAL/PLATELET
Abs Immature Granulocytes: 0.01 10*3/uL (ref 0.00–0.07)
Basophils Absolute: 0 10*3/uL (ref 0.0–0.1)
Basophils Relative: 0 %
Eosinophils Absolute: 0.1 10*3/uL (ref 0.0–0.5)
Eosinophils Relative: 3 %
HCT: 39.3 % (ref 36.0–46.0)
Hemoglobin: 12.5 g/dL (ref 12.0–15.0)
Immature Granulocytes: 0 %
Lymphocytes Relative: 26 %
Lymphs Abs: 1.3 10*3/uL (ref 0.7–4.0)
MCH: 29.8 pg (ref 26.0–34.0)
MCHC: 31.8 g/dL (ref 30.0–36.0)
MCV: 93.6 fL (ref 80.0–100.0)
Monocytes Absolute: 0.6 10*3/uL (ref 0.1–1.0)
Monocytes Relative: 12 %
Neutro Abs: 3 10*3/uL (ref 1.7–7.7)
Neutrophils Relative %: 59 %
Platelets: 216 10*3/uL (ref 150–400)
RBC: 4.2 MIL/uL (ref 3.87–5.11)
RDW: 14.5 % (ref 11.5–15.5)
WBC: 5.1 10*3/uL (ref 4.0–10.5)
nRBC: 0 % (ref 0.0–0.2)

## 2022-03-05 LAB — BASIC METABOLIC PANEL
Anion gap: 10 (ref 5–15)
BUN: 23 mg/dL (ref 8–23)
CO2: 32 mmol/L (ref 22–32)
Calcium: 9.4 mg/dL (ref 8.9–10.3)
Chloride: 99 mmol/L (ref 98–111)
Creatinine, Ser: 1 mg/dL (ref 0.44–1.00)
GFR, Estimated: 56 mL/min — ABNORMAL LOW (ref >60)
Glucose, Bld: 115 mg/dL — ABNORMAL HIGH (ref 70–99)
Potassium: 3.8 mmol/L (ref 3.5–5.1)
Sodium: 141 mmol/L (ref 135–145)

## 2022-03-05 MED ORDER — PREDNISONE 20 MG PO TABS: 20.0000 mg | ORAL_TABLET | Freq: Every day | ORAL | 0 refills | Status: DC

## 2022-03-05 MED ORDER — ARFORMOTEROL TARTRATE 15 MCG/2ML IN NEBU: 15.0000 ug | INHALATION_SOLUTION | Freq: Two times a day (BID) | RESPIRATORY_TRACT | 6 refills | Status: DC

## 2022-03-05 NOTE — Progress Notes (Signed)
CXR on Friday showed no acute process. CBC and BMET were unremarable. No changes to plan.

## 2022-03-05 NOTE — Assessment & Plan Note (Addendum)
-   Increased symptom burden over the last several weeks. Breathing has been worse since back surgery in April. She has some associated wheezing and an occasional dry cough. She is compliant with Pulmicort nebulizer 0.5mg /2ML twice daily and Yupelri once daily.  Her lungs were clear on exam. She is on continuous supplemental oxygen at 3L/min. Recommending adding Brovana (LABA) nebulizer twice daily.  Sending in short course of prednisone for acute symptoms. She would likely benefit from pulmonary rehab but is unable to commit at this time due to travel limitations. We will get CXR today showed no acute process. CBC and BMET were unremarable. Recommend getting repeat pulmonary function testing to montinue lung function. She needs follow-up in 3 months.

## 2022-03-05 NOTE — Patient Instructions (Addendum)
Recommendations: Continue Pulmicort nebulizer 2 times daily Start Brovana nebulizer 2 times daily  Continue Yupelri nebulizer once daily DuoNeb every 6 hours as needed for breakthrough shortness of breath or wheezing Consider pulmonary rehab  Rx: 20 mg of prednisone x5 days  Orders Chest x-ray and labs today Pulmonary function test first available  Follow-up: 3 months with Dr. Mortimer Fries

## 2022-03-05 NOTE — Progress Notes (Addendum)
@Patient  ID: Jenny Cooper, female    DOB: Nov 23, 1935, 86 y.o.   MRN: [id number]  Chief Complaint  Patient presents with   Follow-up    SOB with exertion and wheezing.     Referring provider: Glean Hess, MD  HPI: Synopsis - 86 year old female first evaluated by pulmonary in early 2016 for chronic cough, found to have right hilar mass, biopsy of mass and pathology specimens positive for squamous cell, stage IIIa. Now status post chemoradiation, recently found to have left lower lobe mass, with chronic cough, chronic antibiotics. Biopsy, EBUS, ENB, of left lower lobe mass negative for malignancy - she has an adverse reaction to prednisone therapy wears her A. fib goes out of control She is currently on amiodarone therapy  chronic cough, along with postradiation fibrosis. Has moderate COPD on PFT's 2 years ago Ratio 57% FEV1 58%  ONCOLOGY history Stage III squamous cell lung cancer s/p chemo-RT- 2016. Status post bronchoscopy in February 2017-negative for malignancy; May 5th PET scan shows no significant concerns for recurrent malignancy; shows radiation changes; pleural effusion  03/05/2022 Patient presents today for 1 year follow-up. Hx lung cancer s/p chemoradiation, radiation fibrosis, moderate COPD (FEV1 58%). Patient reports increased shortness of breath over the last 3-4 weeks. Associated wheezing and dry cough. Dyspnea is worse with activity. She is taking Pulmicort and yupelri as preszcribed. She had back surgery in April. She continues to have back pain. She tells me that she can take short dose of prednisone. She is on Eliquis 5 mg twice daily.  She is no longer on amiodarone. She has cardiac monitor every 3 months. She has no trouble sleeping at night.   Allergies  Allergen Reactions   Cefdinir Rash    Had hives after completing treatment - unsure if it was the cause.  Has received 1st generation cephalosporin many times (04/24/2012, 07/27/2013, 09/28/2016,  02/20/2018, 07/11/2018, 08/10/2018 without documented ADRs.   Lovenox [Enoxaparin Sodium] Itching   Meperidine Other (See Comments)    Other Reaction: pt does not like how it makes her feel Other reaction(s): Other (See Comments) Other Reaction: pt does not like how it makes her feel    Immunization History  Administered Date(s) Administered   Fluad Quad(high Dose 65+) 06/02/2020, 10/07/2021   Influenza, High Dose Seasonal PF 06/17/2014, 07/04/2018, 07/01/2019   Influenza,inj,Quad PF,6+ Mos 07/01/2014, 05/28/2015, 05/31/2016, 04/21/2017   Influenza-Unspecified 07/20/2011, 05/03/2012, 07/01/2014, 05/28/2015, 05/31/2016, 04/21/2017   Moderna Sars-Covid-2 Vaccination 09/09/2019, 10/10/2019, 06/20/2020   Pneumococcal Conjugate-13 04/21/2017   Pneumococcal Polysaccharide-23 08/03/2011   Zoster, Live 05/01/2011    Past Medical History:  Diagnosis Date   Acute midline low back pain without sciatica 08/13/2016   Acute on chronic respiratory failure with hypoxia ([place]) 08/21/2018   Acute respiratory failure with hypoxia ([place]) 12/11/2017   Allergy    Aortic atherosclerosis ([place])    Atypical atrial flutter ([place])    a.) s/p ablation 07/27/2013 and 05/10/2016   Balance problem    CAD (coronary artery disease)    a. s/p MI x 2 in 2002 s/p PCI x 2 in 2002; b. s/p 2v CABG 2002; c. stress echo 07/2004 w/ evi of pos & inf infarct & no evi of ischemia; d. 4/08 dipyridamole scan w/ multiple areas of infarct, no ischemia, EF 49%; e. cath 04/28/15 3v CAD, med Rx rec, no targets for revasc, LM lum irregs, pLAD 30%, 100%, ost-pLCx 60%, [name] 99%, OM2 100%, p-mRCA 90%, [name] 100% L-R collats, VG-mLAD irregs, VG-OM2 oc  Chronic anticoagulation    a.) Apixaban   Chronic systolic CHF (congestive heart failure) ([place])    a. echo 03/2015: EF 30-35%, sev ant/inf/pos HK, in mild to mod MR   Closed fracture of right proximal humerus 1/61/0960   Complication of anesthesia    a.) postoperative apnea. b.)  postoperative hypoxia.   Compressed spine fracture ([place]) 08/06/2016   a.) L2, T11, T12   COPD (chronic obstructive pulmonary disease) (HCC)    Deep vein thrombosis (DVT) of left lower extremity ([place]) 12/2001   Fracture of multiple pubic rami, right, closed, initial encounter ([place]) 05/27/2016   a.) RIGHT superior and inferior pubic rami fractures s/p mechanical fall.   GERD (gastroesophageal reflux disease)    HLD (hyperlipidemia)    HTN (hypertension)    Hypothyroidism    Ischemic cardiomyopathy    Mitral regurgitation    a. s/p mitral ring placement 09/2000; b. echo 09/2010: EF 50%, inf HK, post AK, mild MR, prosthetic mitral valve ring w/ peak gradient of 10 mmHg; b. echo 2/13: EF 50%, mild MR/TR      Myocardial infarction ([place]) 2002   a.) x 2 in 2002; PCI with stents (unknown type) placed to the p-mRCA, [name], and [name].   Neuropathy    Osteoarthritis    Osteoporosis    Oxygen dependent    a.) 3 L/   Pacemaker    a. MDT 2002; b. generator replacement 2013; c. followed by Dr. Omelia Blackwater, MD   PAF (paroxysmal atrial fibrillation) [facility])    a.) s/p DCCV on 04/08/2017. b.) on apixaban   Personal history of chemotherapy    Personal history of radiation therapy    Pneumonia    S/P CABG x 2 2002   a.) SVG-LAD, SVG-OM   Squamous cell carcinoma of right lung ([place]) 01/03/2015   a.) stage IIIa. b.) s/p concurrent chemotherapy (carboplatin + paclitaxel) and XRT (IMRT 6000 cGy)   SVT (supraventricular tachycardia) ([place])    Type II diabetes mellitus with complication ([place]) 45/40/9811    Tobacco History: Social History   Tobacco Use  Smoking Status Former   Packs/day: 1.00   Years: 40.00   Total pack years: 40.00   Types: Cigarettes   Quit date: 2001   Years since quitting: 22.5  Smokeless Tobacco Never  Tobacco Comments   quit smoking in 08/28/2000. Smoking cessation materials not required   Counseling given: Not Answered Tobacco comments: quit smoking in 08/28/2000. Smoking  cessation materials not required   Outpatient Medications Prior to Visit  Medication Sig Dispense Refill   acetaminophen (TYLENOL) 500 MG tablet Take 500-1,000 mg by mouth every 4 (four) hours as needed for mild pain or moderate pain.     baclofen (LIORESAL) 10 MG tablet Take 1 tablet (10 mg total) by mouth 3 (three) times daily. 30 each 0   budesonide (PULMICORT) 0.5 MG/2ML nebulizer solution INHALE 2 MILLILITERS BY NEBULIZER 2 TIMES DAILY 360 mL 5   cetirizine (ZYRTEC) 10 MG tablet TAKE (1) TABLET BY MOUTH EVERY DAY 30 tablet 12   cyclobenzaprine (FLEXERIL) 5 MG tablet Take 1 tablet (5 mg total) by mouth 3 (three) times daily. 100 tablet 0   ELIQUIS 5 MG TABS tablet TAKE (1) TABLET BY MOUTH TWICE DAILY 60 tablet 5   fluticasone (FLONASE) 50 MCG/ACT nasal spray Place 2 sprays into both nostrils daily. 16 g 5   furosemide (LASIX) 20 MG tablet Take 1 tablet (20 mg total) by mouth 2 (two) times daily. TAKE 1 TABLET  BY MOUTH TWICE DAILY, ALTERNATING WITH 1 EXTRA TABLET AS NEEDED when weight up by 3 lbs 180 tablet 3   ipratropium-albuterol (DUONEB) 0.5-2.5 (3) MG/3ML SOLN TAKE 3 MLS BY NEBULIZATION EVERY 6 HOURSAS NEEDED FOR WHEEZING OR SHORTNESS OF BREATH 360 mL 1   levothyroxine (SYNTHROID) 88 MCG tablet TAKE (1) TABLET BY MOUTH EVERY DAY BEFORE BREAKFAST 30 tablet 2   metoprolol succinate (TOPROL-XL) 100 MG 24 hr tablet Take 1 tablet by mouth 2 (two) times daily.     Multiple Vitamins-Minerals (CENTRUM SILVER PO) Take 1 tablet by mouth daily.     oxyCODONE (OXY IR/ROXICODONE) 5 MG immediate release tablet Take by mouth.     OXYGEN Inhale 3 L into the lungs.      potassium chloride SA (KLOR-CON M) 20 MEQ tablet Take 1 tablet (20 mEq total) by mouth daily. 90 tablet 3   rosuvastatin (CRESTOR) 5 MG tablet TAKE ONE (1) TABLET BY MOUTH ONCE DAILY 30 tablet 4   vitamin B-12 (CYANOCOBALAMIN) 1000 MCG tablet Take 1,000 mcg by mouth daily.     YUPELRI 175 MCG/3ML nebulizer solution Use one vial in  nebulizer once daily. Do not mix with other nebulized medications. 1200 mL 5   metoprolol succinate (TOPROL-XL) 50 MG 24 hr tablet Take 1 tablet (50 mg total) by mouth in the morning and at bedtime. (Patient taking differently: Take 100 mg by mouth in the morning and at bedtime.) 60 tablet 10   metoprolol succinate (TOPROL-XL) 50 MG 24 hr tablet Take 75 mg by mouth in the morning and at bedtime. Take with or immediately following a meal.     pantoprazole (PROTONIX) 20 MG tablet Take 1 tablet (20 mg total) by mouth daily for 14 days. 14 tablet 0   Facility-Administered Medications Prior to Visit  Medication Dose Route Frequency Provider Last Rate Last Admin   sodium chloride 0.9 % injection 10 mL  10 mL Intravenous PRN Choksi, Janak, MD   10 mL at 02/19/15 1000   sodium chloride flush (NS) 0.9 % injection 10 mL  10 mL Intravenous PRN Cammie Sickle, MD   10 mL at 02/16/16 1048   Review of Systems  Review of Systems  Constitutional: Negative.   HENT: Negative.    Respiratory:  Positive for shortness of breath and wheezing.      Physical Exam  BP (!) 102/56 (BP Location: Left Arm, Cuff Size: Normal)   Pulse 89   Temp 97.9 F (36.6 C) (Temporal)   Ht 5\' 6"  (1.676 m)   Wt 130 lb 3.2 oz (59.1 kg)   SpO2 90%   BMI 21.01 kg/m  Physical Exam Constitutional:      Appearance: Normal appearance.  HENT:     Head: Normocephalic and atraumatic.     Mouth/Throat:     Mouth: Mucous membranes are moist.     Pharynx: Oropharynx is clear.  Cardiovascular:     Rate and Rhythm: Normal rate and regular rhythm.     Comments: RRR; no edema Pulmonary:     Effort: Pulmonary effort is normal.     Breath sounds: Normal breath sounds.     Comments: Distant rale right base; 3L oxygen  Musculoskeletal:        General: Normal range of motion.     Cervical back: Normal range of motion and neck supple.  Skin:    General: Skin is warm and dry.  Neurological:     General: No focal deficit  present.  Mental Status: She is alert and oriented to person, place, and time. Mental status is at baseline.  Psychiatric:        Mood and Affect: Mood normal.        Behavior: Behavior normal.        Thought Content: Thought content normal.        Judgment: Judgment normal.      Lab Results:  CBC    Component Value Date/Time   WBC 5.1 03/05/2022 1550   RBC 4.20 03/05/2022 1550   HGB 12.5 03/05/2022 1550   HGB 12.4 10/07/2021 1408   HCT 39.3 03/05/2022 1550   HCT 38.3 10/07/2021 1408   PLT 216 03/05/2022 1550   PLT 240 10/07/2021 1408   MCV 93.6 03/05/2022 1550   MCV 89 10/07/2021 1408   MCV 92 12/24/2014 1457   MCH 29.8 03/05/2022 1550   MCHC 31.8 03/05/2022 1550   RDW 14.5 03/05/2022 1550   RDW 14.9 10/07/2021 1408   RDW 21.9 (H) 12/24/2014 1457   LYMPHSABS 1.3 03/05/2022 1550   LYMPHSABS 1.2 10/07/2021 1408   LYMPHSABS 1.0 12/24/2014 1457   MONOABS 0.6 03/05/2022 1550   MONOABS 0.8 12/24/2014 1457   EOSABS 0.1 03/05/2022 1550   EOSABS 0.0 10/07/2021 1408   EOSABS 0.2 12/24/2014 1457   BASOSABS 0.0 03/05/2022 1550   BASOSABS 0.0 10/07/2021 1408   BASOSABS 0.0 12/24/2014 1457    BMET    Component Value Date/Time   NA 141 03/05/2022 1550   NA 141 10/07/2021 1408   NA 136 12/24/2014 1457   K 3.8 03/05/2022 1550   K 3.6 12/24/2014 1457   CL 99 03/05/2022 1550   CL 98 (L) 12/24/2014 1457   CO2 32 03/05/2022 1550   CO2 32 12/24/2014 1457   GLUCOSE 115 (H) 03/05/2022 1550   GLUCOSE 107 (H) 12/24/2014 1457   BUN 23 03/05/2022 1550   BUN 22 10/07/2021 1408   BUN 17 12/24/2014 1457   CREATININE 1.00 03/05/2022 1550   CREATININE 1.00 12/24/2014 1457   CALCIUM 9.4 03/05/2022 1550   CALCIUM 9.5 12/24/2014 1457   GFRNONAA 56 (L) 03/05/2022 1550   GFRNONAA 55 (L) 12/24/2014 1457   GFRAA 33 (L) 02/11/2020 1601   GFRAA >60 12/24/2014 1457    BNP    Component Value Date/Time   BNP 644.6 (H) 06/26/2021 0919    ProBNP No results found for:  "PROBNP"  Imaging: DG Chest 2 View  Result Date: 03/05/2022 CLINICAL DATA:  Increased shortness of breath, COPD, history of right lung cancer EXAM: CHEST - 2 VIEW COMPARISON:  06/23/2021 FINDINGS: Frontal and lateral views of the chest demonstrate stable right chest wall port, dual lead pacer, and postsurgical changes from median sternotomy and mitral valve annuloplasty. Cardiac silhouette is unchanged. Continued ectasia and atherosclerosis of the thoracic aorta. Chronic density in the right hilum consistent with post therapeutic change in this patient with a known history of lung cancer. Basilar predominant scarring also noted. No acute airspace disease, effusion, or pneumothorax. Stable right shoulder arthroplasty. IMPRESSION: 1. Stable exam, no acute process. Electronically Signed   By: Randa Ngo M.D.   On: 03/05/2022 15:44     Assessment & Plan:   Moderate COPD (chronic obstructive pulmonary disease) (HCC) - Increased symptom burden over the last several weeks. Breathing has been worse since back surgery in April. She has some associated wheezing and an occasional dry cough. She is compliant with Pulmicort nebulizer 0.5mg /2ML twice daily and Yupelri once  daily.  Her lungs were clear on exam. She is on continuous supplemental oxygen at 3L/min. Recommending adding Brovana (LABA) nebulizer twice daily.  Sending in short course of prednisone for acute symptoms. She would likely benefit from pulmonary rehab but is unable to commit at this time due to travel limitations. We will get CXR today showed no acute process. CBC and BMET were unremarable. Recommend getting repeat pulmonary function testing to montinue lung function. She needs follow-up in 3 months.   40 mins spent on case: >50 % face to face with patient   Martyn Ehrich, NP 03/05/2022

## 2022-03-18 ENCOUNTER — Other Ambulatory Visit: Payer: Self-pay | Admitting: Internal Medicine

## 2022-03-23 ENCOUNTER — Inpatient Hospital Stay (HOSPITAL_BASED_OUTPATIENT_CLINIC_OR_DEPARTMENT_OTHER): Payer: Medicare Other | Admitting: Medical Oncology

## 2022-03-23 ENCOUNTER — Ambulatory Visit: Payer: Medicare Other | Admitting: Internal Medicine

## 2022-03-23 ENCOUNTER — Encounter: Payer: Self-pay | Admitting: Medical Oncology

## 2022-03-23 ENCOUNTER — Inpatient Hospital Stay: Payer: Medicare Other | Attending: Internal Medicine

## 2022-03-23 VITALS — BP 100/74 | HR 92 | Temp 97.6°F | Ht 66.0 in | Wt 131.2 lb

## 2022-03-23 DIAGNOSIS — R29898 Other symptoms and signs involving the musculoskeletal system: Secondary | ICD-10-CM

## 2022-03-23 DIAGNOSIS — C3401 Malignant neoplasm of right main bronchus: Secondary | ICD-10-CM | POA: Diagnosis not present

## 2022-03-23 DIAGNOSIS — M546 Pain in thoracic spine: Secondary | ICD-10-CM | POA: Diagnosis not present

## 2022-03-23 DIAGNOSIS — I509 Heart failure, unspecified: Secondary | ICD-10-CM | POA: Insufficient documentation

## 2022-03-23 DIAGNOSIS — G8929 Other chronic pain: Secondary | ICD-10-CM

## 2022-03-23 DIAGNOSIS — Z85118 Personal history of other malignant neoplasm of bronchus and lung: Secondary | ICD-10-CM | POA: Diagnosis not present

## 2022-03-23 DIAGNOSIS — Z87891 Personal history of nicotine dependence: Secondary | ICD-10-CM | POA: Insufficient documentation

## 2022-03-23 DIAGNOSIS — I4891 Unspecified atrial fibrillation: Secondary | ICD-10-CM | POA: Insufficient documentation

## 2022-03-23 DIAGNOSIS — J449 Chronic obstructive pulmonary disease, unspecified: Secondary | ICD-10-CM | POA: Diagnosis not present

## 2022-03-23 LAB — CBC WITH DIFFERENTIAL/PLATELET
Abs Immature Granulocytes: 0.02 10*3/uL (ref 0.00–0.07)
Basophils Absolute: 0 10*3/uL (ref 0.0–0.1)
Basophils Relative: 0 %
Eosinophils Absolute: 0 10*3/uL (ref 0.0–0.5)
Eosinophils Relative: 0 %
HCT: 38.2 % (ref 36.0–46.0)
Hemoglobin: 12.2 g/dL (ref 12.0–15.0)
Immature Granulocytes: 0 %
Lymphocytes Relative: 19 %
Lymphs Abs: 0.9 10*3/uL (ref 0.7–4.0)
MCH: 30.3 pg (ref 26.0–34.0)
MCHC: 31.9 g/dL (ref 30.0–36.0)
MCV: 94.8 fL (ref 80.0–100.0)
Monocytes Absolute: 0.5 10*3/uL (ref 0.1–1.0)
Monocytes Relative: 10 %
Neutro Abs: 3.2 10*3/uL (ref 1.7–7.7)
Neutrophils Relative %: 71 %
Platelets: 188 10*3/uL (ref 150–400)
RBC: 4.03 MIL/uL (ref 3.87–5.11)
RDW: 14.6 % (ref 11.5–15.5)
WBC: 4.6 10*3/uL (ref 4.0–10.5)
nRBC: 0 % (ref 0.0–0.2)

## 2022-03-23 LAB — IRON AND TIBC
Iron: 78 ug/dL (ref 28–170)
Saturation Ratios: 24 % (ref 10.4–31.8)
TIBC: 322 ug/dL (ref 250–450)
UIBC: 244 ug/dL

## 2022-03-23 LAB — BASIC METABOLIC PANEL
Anion gap: 7 (ref 5–15)
BUN: 19 mg/dL (ref 8–23)
CO2: 34 mmol/L — ABNORMAL HIGH (ref 22–32)
Calcium: 9.3 mg/dL (ref 8.9–10.3)
Chloride: 98 mmol/L (ref 98–111)
Creatinine, Ser: 0.99 mg/dL (ref 0.44–1.00)
GFR, Estimated: 56 mL/min — ABNORMAL LOW (ref >60)
Glucose, Bld: 99 mg/dL (ref 70–99)
Potassium: 3.9 mmol/L (ref 3.5–5.1)
Sodium: 139 mmol/L (ref 135–145)

## 2022-03-23 LAB — FERRITIN: Ferritin: 84 ng/mL (ref 11–307)

## 2022-03-23 NOTE — Progress Notes (Signed)
[facility] OFFICE PROGRESS NOTE  Patient Care Team: Glean Hess, MD as PCP - General (Internal Medicine) Minna Merritts, MD as PCP - Cardiology (Cardiology) Cammie Sickle, MD as Consulting Physician (Internal Medicine) Alisa Graff, FNP as Nurse Practitioner (Cardiology) Flora Lipps, MD as Consulting Physician (Pulmonary Disease) Marvia Pickles, MD as Consulting Physician (Cardiology) Poggi, Marshall Cork, MD as Consulting Physician (Orthopedic Surgery)   Cancer Staging  No matching staging information was found for the patient.   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 Biopsy from hilar area and lymph node station 4R was positive for squamous cell carcinoma.  Patient had compression of the right mainstem bronchus because of enlarged lymph node and a mass; [ T4 N1 M0 tumor stage IIIa ].  2. Started on radiation and chemotherapy from October 21, 2014 3. Finished 6 cycles of carboplatinum and Taxol  in March  29 th of 2016,  PET scan shows significant response  4. Started on consolidation chemotherapy.  Patient finished 2 cycles on July 2016 of carboplatin and Taxol. 5.  Atrial fibrillation diagnosis in August of 2016 on  eloquis 6.  Repeat bronchoscopy was negative for any malignancy  In February 2017.  # SEP 7th 2017- CT [facility]- 5cm hilar mass; bil Ground glass opacities.   # Radiation Pneumonitis [Dr.Mungal] on Prednisone  # Recurrent plueral effusion-[NOV 2019 ]s/p pleural Bx/ pleurex cath [Dr.Oaks]; NEGATIVE for malignancy; Pleurx catheter explanted on December 12th 2019  # May 1st 2018- F one- No targettable mutations; Int-TMB; ? 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  Accompanied her grandson.  Jenny Cooper 86 y.o.  female pleasant patient above history of stage III squamous cell lung cancer/CHF COPD/recurrent right-sided pleural effusion is here for follow-up.  Patient states that she is doing well overall. She asks if she can be seen once yearly instead of once every 6 months. She reports no night sweats, unintentional weight loss, new cough, SOB or increased O2 need. She does state that her back pain which is chronic has slowed her down. Has history of compression fracture and is s/p kyphoplasty. She reports that her back hurts her when she is up and moving and when she is seated for an extended amount of time. Thoracic and lumbar spine. Cervical without pain. Finding herself spending more time in the chair due to pain. Not taking anything for pain.   Denies any worsening shortness of breath or cough.  Denies any swelling in the legs.  No nausea no vomiting.  She continues been oxygen. .  Review of Systems  Constitutional:  Positive for malaise/fatigue. Negative for chills, diaphoresis and fever.  HENT:  Negative for nosebleeds and sore throat.   Eyes:  Negative for double vision.  Respiratory:  Positive for shortness of breath. Negative for cough, hemoptysis, sputum production and wheezing.   Cardiovascular:  Negative for chest pain, palpitations, orthopnea and leg swelling.  Gastrointestinal:  Negative for abdominal pain, blood in stool, constipation, diarrhea, heartburn, melena, nausea and vomiting.  Genitourinary:  Negative for dysuria, frequency and urgency.  Musculoskeletal:  Positive for back pain and  joint pain.  Skin: Negative.  Negative for itching and rash.  Neurological:  Negative for dizziness, tingling, focal weakness, weakness and headaches.  Endo/Heme/Allergies:  Does not bruise/bleed easily.   Psychiatric/Behavioral:  Negative for depression. The patient is not nervous/anxious and does not have insomnia.       PAST MEDICAL HISTORY :  Past Medical History:  Diagnosis Date   Acute midline low back pain without sciatica 08/13/2016   Acute on chronic respiratory failure with hypoxia ([place]) 08/21/2018   Acute respiratory failure with hypoxia (HCC) 12/11/2017   Allergy    Aortic atherosclerosis (HCC)    Atypical atrial flutter ([place])    a.) s/p ablation 07/27/2013 and 05/10/2016   Balance problem    CAD (coronary artery disease)    a. s/p MI x 2 in 2002 s/p PCI x 2 in 2002; b. s/p 2v CABG 2002; c. stress echo 07/2004 w/ evi of pos & inf infarct & no evi of ischemia; d. 4/08 dipyridamole scan w/ multiple areas of infarct, no ischemia, EF 49%; e. cath 04/28/15 3v CAD, med Rx rec, no targets for revasc, LM lum irregs, pLAD 30%, 100%, ost-pLCx 60%, [name] 99%, OM2 100%, p-mRCA 90%, [name] 100% L-R collats, VG-mLAD irregs, VG-OM2 oc   Chronic anticoagulation    a.) Apixaban   Chronic systolic CHF (congestive heart failure) ([place])    a. echo 03/2015: EF 30-35%, sev ant/inf/pos HK, in mild to mod MR   Closed fracture of right proximal humerus 02/17/3085   Complication of anesthesia    a.) postoperative apnea. b.) postoperative hypoxia.   Compressed spine fracture ([place]) 08/06/2016   a.) L2, T11, T12   COPD (chronic obstructive pulmonary disease) (HCC)    Deep vein thrombosis (DVT) of left lower extremity ([place]) 12/2001   Fracture of multiple pubic rami, right, closed, initial encounter ([place]) 05/27/2016   a.) RIGHT superior and inferior pubic rami fractures s/p mechanical fall.   GERD (gastroesophageal reflux disease)    HLD (hyperlipidemia)    HTN (hypertension)    Hypothyroidism    Ischemic cardiomyopathy    Mitral regurgitation    a. s/p mitral ring placement 09/2000; b. echo 09/2010: EF 50%, inf HK, post AK, mild MR, prosthetic mitral valve ring w/ peak gradient of 10 mmHg; b. echo 2/13:  EF 50%, mild MR/TR      Myocardial infarction ([place]) 2002   a.) x 2 in 2002; PCI with stents (unknown type) placed to the p-mRCA, [name], and [name].   Neuropathy    Osteoarthritis    Osteoporosis    Oxygen dependent    a.) 3 L/[place]   Pacemaker    a. MDT 2002; b. generator replacement 2013; c. followed by Dr. Omelia Blackwater, MD   PAF (paroxysmal atrial fibrillation) [facility])    a.) s/p DCCV on 04/08/2017. b.) on apixaban   Personal history of chemotherapy    Personal history of radiation therapy    Pneumonia    S/P CABG x 2 2002   a.) SVG-LAD, SVG-OM   Squamous cell carcinoma of right lung ([place]) 01/03/2015   a.) stage IIIa. b.) s/p concurrent chemotherapy (carboplatin + paclitaxel) and XRT (IMRT 6000 cGy)   SVT (supraventricular tachycardia) ([place])    Type II diabetes mellitus with complication ([place]) 57/84/6962    PAST SURGICAL HISTORY :   Past Surgical History:  Procedure Laterality Date   APPENDECTOMY     CARDIAC CATHETERIZATION N/A 04/28/2015   Procedure: Left Heart Cath and Coronary Angiography;  Surgeon: Rogue Jury  Ferne Reus, MD;  Location: [facility];  Service: Cardiovascular;  Laterality: N/A;   CARDIAC ELECTROPHYSIOLOGY STUDY AND ABLATION N/A 05/10/2016   Procedure(s): INTRACARDIAC ELECTROPHYSIOLOGIC 3D MAPPING, STIMULATION PACING HEART, ICAR CATHETER ABLATION ARRHYTHMIA ADD ON, ABLATE L/R ATRIAL FIBRIL W/ISOLATED PULM VEIN, INTRACARD ECHO, THER/DX INTERVENT; Location: [facility]; Surgeon: Norm Salt, MD   CARDIOVERSION N/A 04/08/2017   Procedure: CARDIOVERSION;  Surgeon: Wellington Hampshire, MD;  Location: [facility];  Service: Cardiovascular;  Laterality: N/A;   CHEST TUBE INSERTION N/A 07/11/2018   Procedure: KYHCWC CATH INSERTION;  Surgeon: [name], MD;  Location: [facility];  Service: Thoracic;  Laterality: N/A;   COLONOSCOPY  12/2009   2 small tubular adenomas   CORONARY ANGIOPLASTY WITH STENT PLACEMENT Left 2002   s/p MI x 2 with subsequent PCI x 2 ; stents (unknown type)  placed to the p-mRCA, [name], and [name].   CORONARY ARTERY BYPASS GRAFT  09/2000   DRAIN REMOVAL Right 08/10/2018   Procedure: DRAIN REMOVAL;  Surgeon: [name], MD;  Location: [facility];  Service: General;  Laterality: Right;   ECTOPIC PREGNANCY SURGERY     ELECTROMAGNETIC NAVIGATION BROCHOSCOPY N/A 10/06/2015   Procedure: ELECTROMAGNETIC NAVIGATION BRONCHOSCOPY;  Surgeon: Flora Lipps, MD;  Location: [facility];  Service: Cardiopulmonary;  Laterality: N/A;   ENDOBRONCHIAL ULTRASOUND N/A 10/06/2015   Procedure: ENDOBRONCHIAL ULTRASOUND;  Surgeon: Flora Lipps, MD;  Location: [facility];  Service: Cardiopulmonary;  Laterality: N/A;   EYE SURGERY     FLEXIBLE BRONCHOSCOPY Right 07/11/2018   Procedure: FLEXIBLE BRONCHOSCOPY;  Surgeon: [name], MD;  Location: [facility];  Service: Thoracic;  Laterality: Right;   IR KYPHO EA ADDL LEVEL THORACIC OR LUMBAR  12/02/2021   IR KYPHO EA ADDL LEVEL THORACIC OR LUMBAR  12/02/2021   IR KYPHO LUMBAR INC FX REDUCE BONE BX UNI/BIL CANNULATION INC/IMAGING  12/02/2021   IR RADIOLOGIST EVAL & MGMT  11/12/2021   IR RADIOLOGIST EVAL & MGMT  01/05/2022   KYPHOPLASTY N/A 09/28/2016   Procedure: KYPHOPLASTY;  Surgeon: Hessie Knows, MD;  Location: [facility];  Service: Orthopedics;  Laterality: N/A;   KYPHOPLASTY N/A 02/20/2018   Procedure: [id number];  Surgeon: Hessie Knows, MD;  Location: [facility];  Service: Orthopedics;  Laterality: N/A;   PACEMAKER INSERTION  03/2012   REVERSE SHOULDER ARTHROPLASTY Right 06/02/2021   Procedure: REVERSE SHOULDER ARTHROPLASTY;  Surgeon: Corky Mull, MD;  Location: [facility];  Service: Orthopedics;  Laterality: Right;   SVT ABLATION N/A 07/27/2013   Procedure: SVT ABLATION; Location: [facility]; Surgeon: Rolla Etienne, MD   thorocentesis  12/24/2016   VAGINAL HYSTERECTOMY     partial - left ovary remains   VIDEO ASSISTED THORACOSCOPY Right 07/11/2018   Procedure: VIDEO ASSISTED THORACOSCOPY;  Surgeon: [name], MD;  Location:  [facility];  Service: Thoracic;  Laterality: Right;   VIDEO ASSISTED THORACOSCOPY (VATS) W/TALC PLEUADESIS Right 07/11/2018   Procedure: THORACOTOMY PLEURAL BIOPSY WITH TALC PLEURODESIS;  Surgeon: [name], MD;  Location: [facility];  Service: Thoracic;  Laterality: Right;  pleural biopsy    FAMILY HISTORY :   Family History  Problem Relation Age of Onset   COPD Mother        sister, and brother   Lung disease Father    Stroke Maternal Grandmother    Hypertension Sister    COPD Sister    Hypertension Brother    COPD Brother    Colon cancer Brother    Heart attack Neg Hx    Breast cancer  Neg Hx     SOCIAL HISTORY:   Social History   Tobacco Use   Smoking status: Former    Packs/day: 1.00    Years: 40.00    Total pack years: 40.00    Types: Cigarettes    Quit date: 2001    Years since quitting: 22.5   Smokeless tobacco: Never   Tobacco comments:    quit smoking in 08/28/2000. Smoking cessation materials not required  Vaping Use   Vaping Use: Never used  Substance Use Topics   Alcohol use: Yes    Alcohol/week: 10.0 standard drinks of alcohol    Types: 10 Glasses of wine per week    Comment: 1 glass of wine per day   Drug use: No    ALLERGIES:  is allergic to cefdinir, lovenox [enoxaparin sodium], and meperidine.  MEDICATIONS:  Current Outpatient Medications  Medication Sig Dispense Refill   acetaminophen (TYLENOL) 500 MG tablet Take 500-1,000 mg by mouth every 4 (four) hours as needed for mild pain or moderate pain.     budesonide (PULMICORT) 0.5 MG/2ML nebulizer solution INHALE 2 MILLILITERS BY NEBULIZER 2 TIMES DAILY 360 mL 5   cetirizine (ZYRTEC) 10 MG tablet TAKE (1) TABLET BY MOUTH EVERY DAY 30 tablet 12   cyclobenzaprine (FLEXERIL) 5 MG tablet Take 1 tablet (5 mg total) by mouth 3 (three) times daily. 100 tablet 0   ELIQUIS 5 MG TABS tablet TAKE (1) TABLET BY MOUTH TWICE DAILY 60 tablet 5   fluticasone (FLONASE) 50 MCG/ACT nasal spray Place 2 sprays into  both nostrils daily. 16 g 5   furosemide (LASIX) 20 MG tablet Take 1 tablet (20 mg total) by mouth 2 (two) times daily. TAKE 1 TABLET BY MOUTH TWICE DAILY, ALTERNATING WITH 1 EXTRA TABLET AS NEEDED when weight up by 3 lbs 180 tablet 3   ipratropium-albuterol (DUONEB) 0.5-2.5 (3) MG/3ML SOLN TAKE 3 MLS BY NEBULIZATION EVERY 6 HOURSAS NEEDED FOR WHEEZING OR SHORTNESS OF BREATH 360 mL 1   levothyroxine (SYNTHROID) 88 MCG tablet TAKE (1) TABLET BY MOUTH EVERY DAY BEFORE BREAKFAST 30 tablet 2   metoprolol succinate (TOPROL-XL) 100 MG 24 hr tablet Take 1 tablet by mouth 2 (two) times daily.     Multiple Vitamins-Minerals (CENTRUM SILVER PO) Take 1 tablet by mouth daily.     OXYGEN Inhale 3 L into the lungs.      potassium chloride SA (KLOR-CON M) 20 MEQ tablet Take 1 tablet (20 mEq total) by mouth daily. 90 tablet 3   rosuvastatin (CRESTOR) 5 MG tablet TAKE ONE (1) TABLET BY MOUTH ONCE DAILY 30 tablet 4   vitamin B-12 (CYANOCOBALAMIN) 1000 MCG tablet Take 1,000 mcg by mouth daily.     YUPELRI 175 MCG/3ML nebulizer solution Use one vial in nebulizer once daily. Do not mix with other nebulized medications. 90 mL 11   arformoterol (BROVANA) 15 MCG/2ML NEBU Take 2 mLs (15 mcg total) by nebulization 2 (two) times daily. (Patient not taking: Reported on 03/23/2022) 120 mL 6   pantoprazole (PROTONIX) 20 MG tablet Take 1 tablet (20 mg total) by mouth daily for 14 days. 14 tablet 0   No current facility-administered medications for this visit.   Facility-Administered Medications Ordered in Other Visits  Medication Dose Route Frequency Provider Last Rate Last Admin   sodium chloride 0.9 % injection 10 mL  10 mL Intravenous PRN Choksi, Janak, MD   10 mL at 02/19/15 1000   sodium chloride flush (NS) 0.9 % injection 10  mL  10 mL Intravenous PRN Cammie Sickle, MD   10 mL at 02/16/16 1048    PHYSICAL EXAMINATION: ECOG PERFORMANCE STATUS: 2 - Symptomatic, <50% confined to bed  BP 100/74 (BP Location: Left  Arm, Patient Position: Sitting, Cuff Size: Normal)   Pulse 92   Temp 97.6 F (36.4 C) (Tympanic)   Ht _0  (1.676 m)   Wt 131 lb 3.2 oz (59.5 kg)   SpO2 90%   BMI 21.18 kg/m   Filed Weights   03/23/22 1306  Weight: 131 lb 3.2 oz (59.5 kg)    Physical Exam Constitutional:      Comments: [place] Caucasian female patient.  She is accompanied by her grandson.  In wheelchair. On 2 to 3 L of oxygen.  HENT:     Head: Normocephalic and atraumatic.     Mouth/Throat:     Pharynx: No oropharyngeal exudate.  Eyes:     Pupils: Pupils are equal, round, and reactive to light.  Cardiovascular:     Rate and Rhythm: Normal rate and regular rhythm.  Pulmonary:     Effort: No respiratory distress.     Breath sounds: No wheezing.  Abdominal:     General: Bowel sounds are normal. There is no distension.     Palpations: Abdomen is soft. There is no mass.     Tenderness: There is no abdominal tenderness. There is no guarding or rebound.  Musculoskeletal:        General: No tenderness. Normal range of motion.     Cervical back: Normal range of motion and neck supple.  Skin:    General: Skin is warm.  Neurological:     Mental Status: She is alert and oriented to person, place, and time.  Psychiatric:        Mood and Affect: Affect normal.        LABORATORY DATA:  I have reviewed the data as listed    Component Value Date/Time   NA 139 03/23/2022 1301   NA 141 10/07/2021 1408   NA 136 12/24/2014 1457   K 3.9 03/23/2022 1301   K 3.6 12/24/2014 1457   CL 98 03/23/2022 1301   CL 98 (L) 12/24/2014 1457   CO2 34 (H) 03/23/2022 1301   CO2 32 12/24/2014 1457   GLUCOSE 99 03/23/2022 1301   GLUCOSE 107 (H) 12/24/2014 1457   BUN 19 03/23/2022 1301   BUN 22 10/07/2021 1408   BUN 17 12/24/2014 1457   CREATININE 0.99 03/23/2022 1301   CREATININE 1.00 12/24/2014 1457   CALCIUM 9.3 03/23/2022 1301   CALCIUM 9.5 12/24/2014 1457   PROT 8.3 (H) 11/04/2021 1301   PROT 8.0 10/07/2021  1408   PROT 7.4 12/24/2014 1457   ALBUMIN 4.1 11/04/2021 1301   ALBUMIN 4.3 10/07/2021 1408   ALBUMIN 3.9 12/24/2014 1457   AST 28 11/04/2021 1301   AST 22 12/24/2014 1457   ALT 12 11/04/2021 1301   ALT 19 12/24/2014 1457   ALKPHOS 100 11/04/2021 1301   ALKPHOS 65 12/24/2014 1457   BILITOT 0.8 11/04/2021 1301   BILITOT 0.5 10/07/2021 1408   BILITOT 0.4 12/24/2014 1457   GFRNONAA 56 (L) 03/23/2022 1301   GFRNONAA 55 (L) 12/24/2014 1457   GFRAA 33 (L) 02/11/2020 1601   GFRAA >60 12/24/2014 1457    No results found for: "SPEP", "UPEP"  Lab Results  Component Value Date   WBC 4.6 03/23/2022   NEUTROABS 3.2 03/23/2022   HGB 12.2 03/23/2022  HCT 38.2 03/23/2022   MCV 94.8 03/23/2022   PLT 188 03/23/2022      Chemistry      Component Value Date/Time   NA 139 03/23/2022 1301   NA 141 10/07/2021 1408   NA 136 12/24/2014 1457   K 3.9 03/23/2022 1301   K 3.6 12/24/2014 1457   CL 98 03/23/2022 1301   CL 98 (L) 12/24/2014 1457   CO2 34 (H) 03/23/2022 1301   CO2 32 12/24/2014 1457   BUN 19 03/23/2022 1301   BUN 22 10/07/2021 1408   BUN 17 12/24/2014 1457   CREATININE 0.99 03/23/2022 1301   CREATININE 1.00 12/24/2014 1457   GLU 94 01/22/2020 0000      Component Value Date/Time   CALCIUM 9.3 03/23/2022 1301   CALCIUM 9.5 12/24/2014 1457   ALKPHOS 100 11/04/2021 1301   ALKPHOS 65 12/24/2014 1457   AST 28 11/04/2021 1301   AST 22 12/24/2014 1457   ALT 12 11/04/2021 1301   ALT 19 12/24/2014 1457   BILITOT 0.8 11/04/2021 1301   BILITOT 0.5 10/07/2021 1408   BILITOT 0.4 12/24/2014 1457       RADIOGRAPHIC STUDIES: I have personally reviewed the radiological images as listed and agreed with the findings in the report. No results found.   ASSESSMENT & PLAN:  Encounter Diagnoses  Name Primary?   Cancer of hilus of right lung (HCC) Yes   Chronic midline thoracic back pain    Muscular deconditioning    Chronic without evidence of recurrence. She wishes to push  follow up out to 1 year due to medical burden. Quality of life more important at this time to her. Reviewed red flags that would indicate that she needs follow up sooner. CT scan before next visit- has had imaging of chest in March by various providers.  Chronic in nature- new to me. X ray spine- Pain management vs palliative. Discussed with patient. She may want to trial CBD.  Acute on chronic. Secondary to pain and reduction of activity. PT referral discussed and placed.   RTC 1 year with CT chest w/ contrast 2-7 days before   Orders Placed This Encounter  Procedures   CBC with Differential/Platelet    Standing Status:   Future    Standing Expiration Date:   8/67/6720   Basic metabolic panel    Standing Status:   Future    Standing Expiration Date:   03/24/2023   Iron and TIBC    Standing Status:   Future    Standing Expiration Date:   03/24/2023   Ferritin    Standing Status:   Future    Standing Expiration Date:   03/24/2023   All questions were answered. The patient knows to call the clinic with any problems, questions or concerns.      Hughie Closs, PA-C 03/23/2022 3:57 PM

## 2022-03-24 ENCOUNTER — Other Ambulatory Visit: Payer: Self-pay

## 2022-03-24 DIAGNOSIS — C3401 Malignant neoplasm of right main bronchus: Secondary | ICD-10-CM

## 2022-03-25 ENCOUNTER — Ambulatory Visit
Admission: RE | Admit: 2022-03-25 | Discharge: 2022-03-25 | Disposition: A | Discharge status: Home / Self Care | Payer: Medicare Other | Source: Ambulatory Visit | Attending: Medical Oncology | Admitting: Medical Oncology

## 2022-03-25 ENCOUNTER — Other Ambulatory Visit: Payer: Self-pay | Admitting: Medical Oncology

## 2022-03-25 ENCOUNTER — Ambulatory Visit
Admission: RE | Admit: 2022-03-25 | Discharge: 2022-03-25 | Disposition: A | Discharge status: Home / Self Care | Payer: Medicare Other | Attending: Neurosurgery | Admitting: Neurosurgery

## 2022-03-25 DIAGNOSIS — G8929 Other chronic pain: Secondary | ICD-10-CM

## 2022-03-25 DIAGNOSIS — M546 Pain in thoracic spine: Secondary | ICD-10-CM | POA: Diagnosis present

## 2022-04-01 ENCOUNTER — Other Ambulatory Visit: Payer: Self-pay | Admitting: Medical Oncology

## 2022-04-01 DIAGNOSIS — G8929 Other chronic pain: Secondary | ICD-10-CM

## 2022-04-22 ENCOUNTER — Other Ambulatory Visit: Payer: Self-pay | Admitting: Internal Medicine

## 2022-05-10 ENCOUNTER — Encounter: Payer: Self-pay | Admitting: Family

## 2022-05-10 ENCOUNTER — Other Ambulatory Visit
Admission: RE | Admit: 2022-05-10 | Discharge: 2022-05-10 | Disposition: A | Discharge status: Home / Self Care | Payer: Medicare Other | Source: Ambulatory Visit | Attending: Family | Admitting: Family

## 2022-05-10 ENCOUNTER — Ambulatory Visit (HOSPITAL_BASED_OUTPATIENT_CLINIC_OR_DEPARTMENT_OTHER): Payer: Medicare Other | Admitting: Family

## 2022-05-10 VITALS — BP 115/79 | HR 94 | Resp 16 | Wt 129.2 lb

## 2022-05-10 DIAGNOSIS — E785 Hyperlipidemia, unspecified: Secondary | ICD-10-CM | POA: Insufficient documentation

## 2022-05-10 DIAGNOSIS — I5042 Chronic combined systolic (congestive) and diastolic (congestive) heart failure: Secondary | ICD-10-CM

## 2022-05-10 DIAGNOSIS — I4892 Unspecified atrial flutter: Secondary | ICD-10-CM | POA: Diagnosis not present

## 2022-05-10 DIAGNOSIS — I252 Old myocardial infarction: Secondary | ICD-10-CM | POA: Insufficient documentation

## 2022-05-10 DIAGNOSIS — I25709 Atherosclerosis of coronary artery bypass graft(s), unspecified, with unspecified angina pectoris: Secondary | ICD-10-CM

## 2022-05-10 DIAGNOSIS — E039 Hypothyroidism, unspecified: Secondary | ICD-10-CM | POA: Insufficient documentation

## 2022-05-10 DIAGNOSIS — Z85118 Personal history of other malignant neoplasm of bronchus and lung: Secondary | ICD-10-CM | POA: Insufficient documentation

## 2022-05-10 DIAGNOSIS — I82502 Chronic embolism and thrombosis of unspecified deep veins of left lower extremity: Secondary | ICD-10-CM | POA: Insufficient documentation

## 2022-05-10 DIAGNOSIS — Z79899 Other long term (current) drug therapy: Secondary | ICD-10-CM | POA: Insufficient documentation

## 2022-05-10 DIAGNOSIS — I5022 Chronic systolic (congestive) heart failure: Secondary | ICD-10-CM | POA: Insufficient documentation

## 2022-05-10 DIAGNOSIS — J449 Chronic obstructive pulmonary disease, unspecified: Secondary | ICD-10-CM | POA: Insufficient documentation

## 2022-05-10 DIAGNOSIS — Z87891 Personal history of nicotine dependence: Secondary | ICD-10-CM | POA: Insufficient documentation

## 2022-05-10 DIAGNOSIS — Z7901 Long term (current) use of anticoagulants: Secondary | ICD-10-CM | POA: Insufficient documentation

## 2022-05-10 DIAGNOSIS — I10 Essential (primary) hypertension: Secondary | ICD-10-CM

## 2022-05-10 DIAGNOSIS — Z9981 Dependence on supplemental oxygen: Secondary | ICD-10-CM | POA: Insufficient documentation

## 2022-05-10 DIAGNOSIS — K219 Gastro-esophageal reflux disease without esophagitis: Secondary | ICD-10-CM | POA: Insufficient documentation

## 2022-05-10 DIAGNOSIS — I4891 Unspecified atrial fibrillation: Secondary | ICD-10-CM | POA: Insufficient documentation

## 2022-05-10 DIAGNOSIS — I110 Hypertensive heart disease with heart failure: Secondary | ICD-10-CM | POA: Insufficient documentation

## 2022-05-10 LAB — BASIC METABOLIC PANEL
Anion gap: 10 (ref 5–15)
BUN: 37 mg/dL — ABNORMAL HIGH (ref 8–23)
CO2: 32 mmol/L (ref 22–32)
Calcium: 9.3 mg/dL (ref 8.9–10.3)
Chloride: 99 mmol/L (ref 98–111)
Creatinine, Ser: 0.94 mg/dL (ref 0.44–1.00)
GFR, Estimated: 60 mL/min — ABNORMAL LOW (ref >60)
Glucose, Bld: 103 mg/dL — ABNORMAL HIGH (ref 70–99)
Potassium: 3.4 mmol/L — ABNORMAL LOW (ref 3.5–5.1)
Sodium: 141 mmol/L (ref 135–145)

## 2022-05-10 MED ORDER — POTASSIUM CHLORIDE ER 10 MEQ PO TBCR: 20.0000 meq | EXTENDED_RELEASE_TABLET | Freq: Every day | ORAL | 5 refills | Status: DC

## 2022-05-10 NOTE — Progress Notes (Signed)
Patient ID: Jenny Cooper, female    DOB: [birth year], 86 y.o.   MRN: [id number]  HPI  Ms Gaber is a 86 y/o female with a history of atrial fibrillation (cardioversion), MI, lung cancer, hypothyroidism, HTN, hyperlipidemia, blood clots, GERD, COPD, spinal compression, previous tobacco use and chronic heart failure.   Echo report from 10/27/21 reviewed and showed an EF of 40%. Echo report from 06/24/21 reviewed and showed an EF of 40-45% and moderately elevated PA pressure of 56.0. Echo done 04/06/17 reviewed and shows an EF of 30-35% along with trivial AR, moderate MR and moderately elevated PA pressure of 50 mm Hg.   Cardiac catheterization done 04/28/15 shows severe underlying three-vessel CAD with occluded SVG likely to OM. No good options for revascularization. Native RCA is occluded with left-to-right collaterals. Recommended optimizing medical therapy.   Has not been admitted or been in the ED in the last 6 months.   She presents today for a follow-up visit with a chief complaint of SOB upon moderate exertion, cough, wheezing. She has associated abdominal distention. She denies any difficulty sleeping (normally), dizziness, headaches, cough, chest pain/pressure, palpitations, nor lower extremity edema, nor weight gain. She does have neuropathy in both hands. Notices that she gets full easily when she's eating.   Follows a low-sodium diet and does not add salt. Weighs everyday and weight is stable. Former smoker and does nebulizer treatments at home. Lives with son and is accompanied by grandson today.   Past Medical History:  Diagnosis Date   Acute midline low back pain without sciatica 08/13/2016   Acute on chronic respiratory failure with hypoxia ([place]) 08/21/2018   Acute respiratory failure with hypoxia ([place]) 12/11/2017   Allergy    Aortic atherosclerosis ([place])    Atypical atrial flutter ([place])    a.) s/p ablation 07/27/2013 and 05/10/2016   Balance problem    CAD (coronary artery  disease)    a. s/p MI x 2 in 2002 s/p PCI x 2 in 2002; b. s/p 2v CABG 2002; c. stress echo 07/2004 w/ evi of pos & inf infarct & no evi of ischemia; d. 4/08 dipyridamole scan w/ multiple areas of infarct, no ischemia, EF 49%; e. cath 04/28/15 3v CAD, med Rx rec, no targets for revasc, LM lum irregs, pLAD 30%, 100%, ost-pLCx 60%, [name] 99%, OM2 100%, p-mRCA 90%, [name] 100% L-R collats, VG-mLAD irregs, VG-OM2 oc   Chronic anticoagulation    a.) Apixaban   Chronic systolic CHF (congestive heart failure) ([place])    a. echo 03/2015: EF 30-35%, sev ant/inf/pos HK, in mild to mod MR   Closed fracture of right proximal humerus 6/59/9357   Complication of anesthesia    a.) postoperative apnea. b.) postoperative hypoxia.   Compressed spine fracture ([place]) 08/06/2016   a.) L2, T11, T12   COPD (chronic obstructive pulmonary disease) (HCC)    Deep vein thrombosis (DVT) of left lower extremity ([place]) 12/2001   Fracture of multiple pubic rami, right, closed, initial encounter ([place]) 05/27/2016   a.) RIGHT superior and inferior pubic rami fractures s/p mechanical fall.   GERD (gastroesophageal reflux disease)    HLD (hyperlipidemia)    HTN (hypertension)    Hypothyroidism    Ischemic cardiomyopathy    Mitral regurgitation    a. s/p mitral ring placement 09/2000; b. echo 09/2010: EF 50%, inf HK, post AK, mild MR, prosthetic mitral valve ring w/ peak gradient of 10 mmHg; b. echo 2/13: EF 50%, mild MR/TR  Myocardial infarction ([place]) 2002   a.) x 2 in 2002; PCI with stents (unknown type) placed to the p-mRCA, [name], and [name].   Neuropathy    Osteoarthritis    Osteoporosis    Oxygen dependent    a.) 3 L/[place]   Pacemaker    a. MDT 2002; b. generator replacement 2013; c. followed by Dr. Omelia Blackwater, MD   PAF (paroxysmal atrial fibrillation) [facility])    a.) s/p DCCV on 04/08/2017. b.) on apixaban   Personal history of chemotherapy    Personal history of radiation therapy    Pneumonia    S/P CABG x 2 2002   a.)  SVG-LAD, SVG-OM   Squamous cell carcinoma of right lung ([place]) 01/03/2015   a.) stage IIIa. b.) s/p concurrent chemotherapy (carboplatin + paclitaxel) and XRT (IMRT 6000 cGy)   SVT (supraventricular tachycardia) (HCC)    Type II diabetes mellitus with complication ([place]) 88/28/0034   Past Surgical History:  Procedure Laterality Date   APPENDECTOMY     CARDIAC CATHETERIZATION N/A 04/28/2015   Procedure: Left Heart Cath and Coronary Angiography;  Surgeon: Wellington Hampshire, MD;  Location: [facility] [place] [facility];  Service: Cardiovascular;  Laterality: N/A;   CARDIAC ELECTROPHYSIOLOGY STUDY AND ABLATION N/A 05/10/2016   Procedure(s): INTRACARDIAC ELECTROPHYSIOLOGIC 3D MAPPING, STIMULATION PACING HEART, ICAR CATHETER ABLATION ARRHYTHMIA ADD ON, ABLATE L/R ATRIAL FIBRIL W/ISOLATED PULM VEIN, INTRACARD ECHO, THER/DX INTERVENT; Location: [facility]; Surgeon: Norm Salt, MD   CARDIOVERSION N/A 04/08/2017   Procedure: CARDIOVERSION;  Surgeon: Wellington Hampshire, MD;  Location: [facility];  Service: Cardiovascular;  Laterality: N/A;   CHEST TUBE INSERTION N/A 07/11/2018   Procedure: JZPHXT CATH INSERTION;  Surgeon: [name], MD;  Location: [facility];  Service: Thoracic;  Laterality: N/A;   COLONOSCOPY  12/2009   2 small tubular adenomas   CORONARY ANGIOPLASTY WITH STENT PLACEMENT Left 2002   s/p MI x 2 with subsequent PCI x 2 ; stents (unknown type) placed to the p-mRCA, [name], and [name].   CORONARY ARTERY BYPASS GRAFT  09/2000   DRAIN REMOVAL Right 08/10/2018   Procedure: DRAIN REMOVAL;  Surgeon: [name], MD;  Location: [facility];  Service: General;  Laterality: Right;   ECTOPIC PREGNANCY SURGERY     ELECTROMAGNETIC NAVIGATION BROCHOSCOPY N/A 10/06/2015   Procedure: ELECTROMAGNETIC NAVIGATION BRONCHOSCOPY;  Surgeon: Flora Lipps, MD;  Location: [facility];  Service: Cardiopulmonary;  Laterality: N/A;   ENDOBRONCHIAL ULTRASOUND N/A 10/06/2015   Procedure: ENDOBRONCHIAL ULTRASOUND;  Surgeon: Flora Lipps,  MD;  Location: [facility];  Service: Cardiopulmonary;  Laterality: N/A;   EYE SURGERY     FLEXIBLE BRONCHOSCOPY Right 07/11/2018   Procedure: FLEXIBLE BRONCHOSCOPY;  Surgeon: [name], MD;  Location: [facility];  Service: Thoracic;  Laterality: Right;   IR KYPHO EA ADDL LEVEL THORACIC OR LUMBAR  12/02/2021   IR KYPHO EA ADDL LEVEL THORACIC OR LUMBAR  12/02/2021   IR KYPHO LUMBAR INC FX REDUCE BONE BX UNI/BIL CANNULATION INC/IMAGING  12/02/2021   IR RADIOLOGIST EVAL & MGMT  11/12/2021   IR RADIOLOGIST EVAL & MGMT  01/05/2022   KYPHOPLASTY N/A 09/28/2016   Procedure: KYPHOPLASTY;  Surgeon: Hessie Knows, MD;  Location: [facility];  Service: Orthopedics;  Laterality: N/A;   KYPHOPLASTY N/A 02/20/2018   Procedure: [id number];  Surgeon: Hessie Knows, MD;  Location: [facility];  Service: Orthopedics;  Laterality: N/A;   PACEMAKER INSERTION  03/2012   REVERSE SHOULDER ARTHROPLASTY Right 06/02/2021   Procedure: REVERSE SHOULDER ARTHROPLASTY;  Surgeon: Corky Mull, MD;  Location: [facility];  Service: Orthopedics;  Laterality: Right;   SVT ABLATION N/A 07/27/2013   Procedure: SVT ABLATION; Location: [facility]; Surgeon: Rolla Etienne, MD   thorocentesis  12/24/2016   VAGINAL HYSTERECTOMY     partial - left ovary remains   VIDEO ASSISTED THORACOSCOPY Right 07/11/2018   Procedure: VIDEO ASSISTED THORACOSCOPY;  Surgeon: [name], MD;  Location: [facility];  Service: Thoracic;  Laterality: Right;   VIDEO ASSISTED THORACOSCOPY (VATS) W/TALC PLEUADESIS Right 07/11/2018   Procedure: THORACOTOMY PLEURAL BIOPSY WITH TALC PLEURODESIS;  Surgeon: [name], MD;  Location: [facility];  Service: Thoracic;  Laterality: Right;  pleural biopsy   Family History  Problem Relation Age of Onset   COPD Mother        sister, and brother   Lung disease Father    Stroke Maternal Grandmother    Hypertension Sister    COPD Sister    Hypertension Brother    COPD Brother    Colon cancer Brother    Heart attack Neg Hx     Breast cancer Neg Hx    Social History   Tobacco Use   Smoking status: Former    Packs/day: 1.00    Years: 40.00    Total pack years: 40.00    Types: Cigarettes    Quit date: 2001    Years since quitting: 22.7   Smokeless tobacco: Never   Tobacco comments:    quit smoking in 08/28/2000. Smoking cessation materials not required  Substance Use Topics   Alcohol use: Yes    Alcohol/week: 10.0 standard drinks of alcohol    Types: 10 Glasses of wine per week    Comment: 1 glass of wine per day   Allergies  Allergen Reactions   Cefdinir Rash    Had hives after completing treatment - unsure if it was the cause.  Has received 1st generation cephalosporin many times (04/24/2012, 07/27/2013, 09/28/2016, 02/20/2018, 07/11/2018, 08/10/2018 without documented ADRs.   Lovenox [Enoxaparin Sodium] Itching   Meperidine Other (See Comments)    Other Reaction: pt does not like how it makes her feel Other reaction(s): Other (See Comments) Other Reaction: pt does not like how it makes her feel   Prior to Admission medications   Medication Sig Start Date End Date Taking? Authorizing Provider  acetaminophen (TYLENOL) 500 MG tablet Take 500-1,000 mg by mouth every 4 (four) hours as needed for mild pain or moderate pain.   Yes [provider]  budesonide (PULMICORT) 0.5 MG/2ML nebulizer solution INHALE 2 MILLILITERS BY NEBULIZER 2 TIMES DAILY 07/17/21  Yes Kasa, Maretta Bees, MD  cetirizine (ZYRTEC) 10 MG tablet TAKE (1) TABLET BY MOUTH EVERY DAY 06/15/18  Yes Glean Hess, MD  ELIQUIS 5 MG TABS tablet TAKE (1) TABLET BY MOUTH TWICE DAILY 02/22/22  Yes Gollan, Kathlene November, MD  fluticasone (FLONASE) 50 MCG/ACT nasal spray Place 2 sprays into both nostrils daily. 01/23/20  Yes Glean Hess, MD  furosemide (LASIX) 20 MG tablet Take 1 tablet (20 mg total) by mouth 2 (two) times daily. TAKE 1 TABLET BY MOUTH TWICE DAILY, ALTERNATING WITH 1 EXTRA TABLET AS NEEDED when weight up by 3 lbs 08/04/21  Yes  Gollan, Kathlene November, MD  ipratropium-albuterol (DUONEB) 0.5-2.5 (3) MG/3ML SOLN TAKE 3 MLS BY NEBULIZATION EVERY 6 HOURSAS NEEDED FOR WHEEZING OR SHORTNESS OF BREATH 04/22/22  Yes Flora Lipps, MD  levothyroxine (SYNTHROID) 88 MCG tablet TAKE (1) TABLET BY MOUTH EVERY DAY BEFORE BREAKFAST 01/08/21  Yes Glean Hess,  MD  metoprolol succinate (TOPROL-XL) 100 MG 24 hr tablet Take 1 tablet by mouth 2 (two) times daily. 02/04/22  Yes [provider]  Multiple Vitamins-Minerals (CENTRUM SILVER PO) Take 1 tablet by mouth daily.   Yes [provider]  OXYGEN Inhale 3 L into the lungs.    Yes [provider]  potassium chloride SA (KLOR-CON M) 20 MEQ tablet Take 1 tablet (20 mEq total) by mouth daily. 08/04/21  Yes Gollan, Kathlene November, MD  rosuvastatin (CRESTOR) 5 MG tablet TAKE ONE (1) TABLET BY MOUTH ONCE DAILY 12/08/21  Yes Gollan, Kathlene November, MD  vitamin B-12 (CYANOCOBALAMIN) 1000 MCG tablet Take 1,000 mcg by mouth daily.   Yes [provider]  YUPELRI 175 MCG/3ML nebulizer solution Use one vial in nebulizer once daily. Do not mix with other nebulized medications. 03/18/22  Yes Kasa, Maretta Bees, MD  arformoterol (BROVANA) 15 MCG/2ML NEBU Take 2 mLs (15 mcg total) by nebulization 2 (two) times daily. Patient not taking: Reported on 03/23/2022 03/05/22   Martyn Ehrich, NP  cyclobenzaprine (FLEXERIL) 5 MG tablet Take 1 tablet (5 mg total) by mouth 3 (three) times daily. Patient not taking: Reported on 05/10/2022 01/05/22   Jacqualine Mau, NP  pantoprazole (PROTONIX) 20 MG tablet Take 1 tablet (20 mg total) by mouth daily for 14 days. Patient not taking: Reported on 05/10/2022 11/04/21 11/25/21  Vanessa [place], MD   Review of Systems  Constitutional:  Negative for appetite change and fatigue.  HENT:  Negative for congestion, rhinorrhea and sore throat.   Eyes: Negative.   Respiratory:  Positive for cough, shortness of breath and wheezing. Negative for chest tightness.    Cardiovascular:  Negative for chest pain, palpitations and leg swelling.  Gastrointestinal:  Positive for abdominal distention. Negative for abdominal pain.  Endocrine: Negative.   Genitourinary: Negative.   Musculoskeletal:  Negative for arthralgias (left leg pain), back pain, myalgias and neck pain.  Skin: Negative.   Allergic/Immunologic: Negative.   Neurological:  Negative for dizziness and light-headedness.  Hematological:  Negative for adenopathy. Does not bruise/bleed easily.  Psychiatric/Behavioral:  Negative for dysphoric mood and sleep disturbance (sleeping on 1 pillow). The patient is not nervous/anxious.    Vitals:   05/10/22 1257  BP: 115/79  Pulse: 94  Resp: 16  SpO2: 92%    Wt Readings from Last 3 Encounters:  05/10/22 129 lb 4 oz (58.6 kg)  03/23/22 131 lb 3.2 oz (59.5 kg)  03/05/22 130 lb 3.2 oz (59.1 kg)    Lab Results  Component Value Date   CREATININE 0.99 03/23/2022   CREATININE 1.00 03/05/2022   CREATININE 1.08 (H) 11/04/2021   Physical Exam Vitals and nursing note reviewed. Exam conducted with a chaperone present (grandson).  Constitutional:      Appearance: Normal appearance. She is well-developed.  HENT:     Head: Normocephalic and atraumatic.  Neck:     Vascular: No JVD.  Cardiovascular:     Rate and Rhythm: Normal rate. Rhythm irregular.     Heart sounds: Normal heart sounds.  Pulmonary:     Effort: Pulmonary effort is normal.     Breath sounds: No wheezing or rales.  Abdominal:     General: There is distension.     Palpations: Abdomen is soft.     Tenderness: There is no abdominal tenderness.  Musculoskeletal:        General: No tenderness.     Cervical back: Normal range of motion and neck supple.  Right lower leg: No edema.     Left lower leg: No edema.  Skin:    General: Skin is warm and dry.  Neurological:     General: No focal deficit present.     Mental Status: She is alert and oriented to person, place, and time.   Psychiatric:        Behavior: Behavior normal.        Thought Content: Thought content normal.    Assessment & Plan:   1: Chronic heart failure with reduced ejection fraction- - NYHA class II - euvolemic today - weighing daily; reminded to call for an overnight weight gain of >2 pounds or a weekly weight gain of >5 pounds - weight 129 down 7 pounds from last visit here 10/14/21  - adding "some" salt but only to eggs; says that she doesn't eat eggs on a daily basis - on GDMT of metoprolol - has history of hypotension so may not be able to add entresto or spironolactone  - Saw cardiology Rockey Situ) 11/25/21 - BNP from 06/26/21 was 644.6 - Will increase lasix pending BMP results from today  2: Atrial fibrillation- - on apixaban - saw EP Glennon Mac) 07/09/21  3: HTN- - BP (115/79)  - saw PCP Army Melia) 11/10/21 - BMP from 03/23/22 reviewed and showed sodium 139, potassium 3.9, creatinine 0.99 and GFR 56  4: COPD- - saw pulmonology Volanda Napoleon) 03/05/22 - wearing oxygen at 2L around the clock   Medication list reviewed.   Return in 2 months, sooner if needed

## 2022-05-10 NOTE — Patient Instructions (Signed)
Continue weighing daily and call for an overnight weight gain of 3 pounds or more or a weekly weight gain of more than 5 pounds.  °

## 2022-05-11 ENCOUNTER — Other Ambulatory Visit: Payer: Self-pay | Admitting: Family

## 2022-05-11 ENCOUNTER — Telehealth: Payer: Self-pay

## 2022-05-11 MED ORDER — POTASSIUM CHLORIDE ER 10 MEQ PO TBCR: 20.0000 meq | EXTENDED_RELEASE_TABLET | Freq: Two times a day (BID) | ORAL | 5 refills | Status: DC

## 2022-05-11 MED ORDER — FUROSEMIDE 20 MG PO TABS: 60.0000 mg | ORAL_TABLET | Freq: Every day | ORAL | 3 refills | Status: DC

## 2022-05-11 NOTE — Progress Notes (Signed)
Med list updated

## 2022-05-11 NOTE — Telephone Encounter (Signed)
Patient will see Dr. Rockey Situ on 9/29 for follow up and lab work. 9/22 labwork and follow up appointment with [facility] canceled.

## 2022-05-11 NOTE — Telephone Encounter (Addendum)
Reviewed lab results and medication changes below with patient. Patient verbalized understanding, and was able to state back that she will increase furosemide to 3 tablets daily and increase potassium to two (10 meq) tablets in the morning and two tablets in the evening. Patient scheduled appointment for 9/22 for follow up and lab work, but then asked if she could just have labs and follow up at Dr. Donivan Scull appt at the end of the month to avoid multiple appts in a short time frame.  Informed patient that I would speak with provider and return her call. Provider notified of patient request.  ----- Message from Alisa Graff, [place] sent at 05/11/2022  9:51 AM EDT ----- Kidney function looks great. Potassium is slightly low. Will increase the furosemide to 3 tablets daily, taken all at once or she can split up during the day. Will also increase the potassium tablets to 2 tablets (84meq tablets) in the morning and 2 tablets in the evening. Need to schedule f/u appt with [place] on 05/21/22 to recheck labs.

## 2022-05-21 ENCOUNTER — Ambulatory Visit: Payer: Medicare Other | Admitting: Family

## 2022-05-28 ENCOUNTER — Ambulatory Visit: Payer: Medicare Other | Attending: Cardiovascular Disease | Admitting: Cardiovascular Disease

## 2022-05-28 ENCOUNTER — Other Ambulatory Visit: Payer: Self-pay | Admitting: Cardiovascular Disease

## 2022-05-28 ENCOUNTER — Other Ambulatory Visit
Admission: RE | Admit: 2022-05-28 | Discharge: 2022-05-28 | Disposition: A | Discharge status: Home / Self Care | Payer: Medicare Other | Source: Ambulatory Visit | Attending: Cardiovascular Disease | Admitting: Cardiovascular Disease

## 2022-05-28 ENCOUNTER — Encounter: Payer: Self-pay | Admitting: Cardiovascular Disease

## 2022-05-28 VITALS — BP 100/52 | HR 90 | Ht 66.0 in | Wt 134.1 lb

## 2022-05-28 DIAGNOSIS — I10 Essential (primary) hypertension: Secondary | ICD-10-CM

## 2022-05-28 DIAGNOSIS — I25709 Atherosclerosis of coronary artery bypass graft(s), unspecified, with unspecified angina pectoris: Secondary | ICD-10-CM | POA: Diagnosis not present

## 2022-05-28 DIAGNOSIS — I5042 Chronic combined systolic (congestive) and diastolic (congestive) heart failure: Secondary | ICD-10-CM | POA: Insufficient documentation

## 2022-05-28 DIAGNOSIS — I4892 Unspecified atrial flutter: Secondary | ICD-10-CM | POA: Insufficient documentation

## 2022-05-28 DIAGNOSIS — I272 Pulmonary hypertension, unspecified: Secondary | ICD-10-CM | POA: Diagnosis present

## 2022-05-28 DIAGNOSIS — I4821 Permanent atrial fibrillation: Secondary | ICD-10-CM

## 2022-05-28 DIAGNOSIS — I495 Sick sinus syndrome: Secondary | ICD-10-CM

## 2022-05-28 DIAGNOSIS — J449 Chronic obstructive pulmonary disease, unspecified: Secondary | ICD-10-CM

## 2022-05-28 DIAGNOSIS — I340 Nonrheumatic mitral (valve) insufficiency: Secondary | ICD-10-CM | POA: Diagnosis present

## 2022-05-28 LAB — BASIC METABOLIC PANEL
Anion gap: 9 (ref 5–15)
BUN: 32 mg/dL — ABNORMAL HIGH (ref 8–23)
CO2: 33 mmol/L — ABNORMAL HIGH (ref 22–32)
Calcium: 9.3 mg/dL (ref 8.9–10.3)
Chloride: 98 mmol/L (ref 98–111)
Creatinine, Ser: 0.89 mg/dL (ref 0.44–1.00)
GFR, Estimated: >60 mL/min (ref >60)
Glucose, Bld: 120 mg/dL — ABNORMAL HIGH (ref 70–99)
Potassium: 4 mmol/L (ref 3.5–5.1)
Sodium: 140 mmol/L (ref 135–145)

## 2022-05-28 MED ORDER — POTASSIUM CHLORIDE ER 10 MEQ PO TBCR: 20.0000 meq | EXTENDED_RELEASE_TABLET | Freq: Two times a day (BID) | ORAL | 5 refills | Status: DC

## 2022-05-28 NOTE — Patient Instructions (Addendum)
  Medication Instructions:   Confirm milligram on the potassium bottle       Total of 40 meq a day (4 of the 10 meq pills)  If you need a refill on your cardiac medications before your next appointment, please call your pharmacy.   Lab work: [employer] today  Testing/Procedures: No new testing needed  Follow-Up: At [facility], you and your health needs are our priority.  As part of our continuing mission to provide you with exceptional heart care, we have created designated Provider Care Teams.  These Care Teams include your primary Cardiologist (physician) and Advanced Practice Providers (APPs -  Physician Assistants and Nurse Practitioners) who all work together to provide you with the care you need, when you need it.  You will need a follow up appointment in 6 months, APP ok  Providers on your designated Care Team:   Murray Hodgkins, NP Christell Faith, PA-C Cadence Kathlen Mody, [place]  COVID-19 Vaccine Information can be found at: [email or web address] For questions related to vaccine distribution or appointments, please email [email or web address] or call [phone].

## 2022-05-28 NOTE — Progress Notes (Signed)
Date:  05/28/2022   ID:  Jenny Cooper, DOB [birth year], MRN [id number]  Patient Location:  [place] [address]   Provider location:   [facility], [place] office  PCP:  Jenny Hess, MD  Cardiologist:  Jenny Cooper  Chief Complaint  Patient presents with   6 month follow up     Pateint c/o shortness of breath with little to no exertion. Medications reviewed by the patient verbally.     History of Present Illness:    Jenny Cooper is a 86 y.o. female past medical history of CAD s/p remote history of MI in 2002  2 vessel CABG post stenting in 2002,  mitral valve repair in 2002 secondary to mitral regurgitation,  PAF TEE/DCCV in 2013 for her PAF. atypical atrial flutter s/p ablation on 07/27/2013  atrial fibrillation and typical atrial flutter ablation [facility]11/2017[facility] s/p MDT PPM,   COPD,  HTN,   HLD  Quit smoking in 2001 carcinoma of right lung, completed chemotherapy  hx of falls, and chronic back pain from collapsed vertebrae Suffered pelvic fracture after fall in September 2017 Chronic bronchitis/"copd from covid" EF 50 to 55% in sinus rhythm Down to 40 to 45% in atrial fibrillation She presents today to the clinic for follow-up of her atrial fibrillation/flutter and CAD, pulmonary hypertension "Chronic afib" per EP  LOV 1/23  Chronic SOB,on three liters [place] She is concerned about heart rate of 90  EKG personally reviewed by myself on todays visit Atrial flutter rate 90 bpm  Kyphoplasty in 2022 "Muscle are gone" Walks with a walker, presents today in a wheelchair  No orthostasis Weight stable  On lasix 60 daily and potassium 20 daily Potassium 3.4 He was recently recommend she increase potassium up to 10 mill equivalents 4 times a day.  She is that she is taking a 5 mill equivalent pill Some confusion as to her potassium dosing  Echocardiogram done February 2023 through their facility, no  change to ejection fraction remains 40 to 45%, markedly elevated right heart pressures but this was not detailed in final impression  Other past medical history reviewed Covid: 12/22  Hospital 06/28/2021  K of 2.6 with AKI and she was sent to the ER.  She was admitted 10/25-10/31 and treated for CHF with IV lasix.  She was started on midodrine for hypotension.  Toprol was decreased and Entresto held for hypotension.  Echo showed LVEF 40-45%, global HL, moderately elevated pulmonary artery pressure.  Shoulder surgery: 06/02/2021 on right Other past medical history reviewed Followed by [facility] EP She underwent cardioversion December 18, 2019, recurrent symptoms Jan 13, 2020, seen by [facility] again Jan 22, 2020, pacemaker changes made  Cardioversion, 04/08/17 Lasix IV for management of her CHF   Echocardiogram showing EF 30 to 35%, moderately elevated RVSP   Cardioversion at the end of May 2018 for atrial flutter Since that time she has felt relatively well, recent vacation in [place] the past week Reports that she did not feel right at times but unable to put her finger on close going on   CT scan September 2016 showing improvement of her lung cancer She finished her cancer treatment over the summer 2016, she had chemotherapy and radiation   TEE/DCCV in 2013 for her PAF. In 2014 she was diagnosed with atypical atrial flutter and underwent ablation. underwent PPM generator change in 2013.     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    finished concurrent chemoradiation with carboplatinum and Taxol and PET scan showed significant response.     Echo showed EF 30-35%, severe anterior and infero/posterior wall HK. Left ventricular function parameters were normal, mild to moderate MR. Left atrium was mildly dilated. RV systolic function was normal. Mild to moderate TR. PASP was moderately to severely elevated at 60 mm Hg.     outpatient cardiac cath on  04/28/2015 that showed 3 vessel CAD with patent SVG to LAD, occluded SVG to OM, native RCA had severe ISR in the mid-segment and was occluded distally at the sites of previously placed stents. There were left to right collaterals. Moderately reduced LVSF with EF of 35-40%. Mildly elevated LVEDP. There were no good targets for revascularization. medical therapy.    . EF moderately reduced on recent echocardiogram (TTE 40-45% on 06/24/21, [facility]). She has been trialed on amiodarone in the past (d/c 03/2020).   Past Medical History:  Diagnosis Date   Acute midline low back pain without sciatica 08/13/2016   Acute on chronic respiratory failure with hypoxia ([place]) 08/21/2018   Acute respiratory failure with hypoxia ([place]) 12/11/2017   Allergy    Aortic atherosclerosis ([place])    Atypical atrial flutter ([place])    a.) s/p ablation 07/27/2013 and 05/10/2016   Balance problem    CAD (coronary artery disease)    a. s/p MI x 2 in 2002 s/p PCI x 2 in 2002; b. s/p 2v CABG 2002; c. stress echo 07/2004 w/ evi of pos & inf infarct & no evi of ischemia; d. 4/08 dipyridamole scan w/ multiple areas of infarct, no ischemia, EF 49%; e. cath 04/28/15 3v CAD, med Rx rec, no targets for revasc, LM lum irregs, pLAD 30%, 100%, ost-pLCx 60%, [name] 99%, OM2 100%, p-mRCA 90%, [name] 100% L-R collats, VG-mLAD irregs, VG-OM2 oc   Chronic anticoagulation    a.) Apixaban   Chronic systolic CHF (congestive heart failure) ([place])    a. echo 03/2015: EF 30-35%, sev ant/inf/pos HK, in mild to mod MR   Closed fracture of right proximal humerus 0/08/7492   Complication of anesthesia    a.) postoperative apnea. b.) postoperative hypoxia.   Compressed spine fracture ([place]) 08/06/2016   a.) L2, T11, T12   COPD (chronic obstructive pulmonary disease) (HCC)    Deep vein thrombosis (DVT) of left lower extremity ([place]) 12/2001   Fracture of multiple pubic rami, right, closed, initial encounter ([place]) 05/27/2016   a.) RIGHT superior and inferior  pubic rami fractures s/p mechanical fall.   GERD (gastroesophageal reflux disease)    HLD (hyperlipidemia)    HTN (hypertension)    Hypothyroidism    Ischemic cardiomyopathy    Mitral regurgitation    a. s/p mitral ring placement 09/2000; b. echo 09/2010: EF 50%, inf HK, post AK, mild MR, prosthetic mitral valve ring w/ peak gradient of 10 mmHg; b. echo 2/13: EF 50%, mild MR/TR      Myocardial infarction ([place]) 2002   a.) x 2 in 2002; PCI with stents (unknown type) placed to the p-mRCA, [name], and [name].   Neuropathy    Osteoarthritis    Osteoporosis    Oxygen dependent    a.) 3 L/[place]   Pacemaker    a. MDT 2002; b. generator replacement 2013; c. followed by Dr. Omelia Blackwater, MD   PAF (paroxysmal atrial fibrillation) [facility])    a.) s/p DCCV on 04/08/2017. b.) on apixaban  Personal history of chemotherapy    Personal history of radiation therapy    Pneumonia    S/P CABG x 2 2002   a.) SVG-LAD, SVG-OM   Squamous cell carcinoma of right lung ([place]) 01/03/2015   a.) stage IIIa. b.) s/p concurrent chemotherapy (carboplatin + paclitaxel) and XRT (IMRT 6000 cGy)   SVT (supraventricular tachycardia) (HCC)    Type II diabetes mellitus with complication ([place]) 66/01/3015   Past Surgical History:  Procedure Laterality Date   APPENDECTOMY     CARDIAC CATHETERIZATION N/A 04/28/2015   Procedure: Left Heart Cath and Coronary Angiography;  Surgeon: Wellington Hampshire, MD;  Location: [facility];  Service: Cardiovascular;  Laterality: N/A;   CARDIAC ELECTROPHYSIOLOGY STUDY AND ABLATION N/A 05/10/2016   Procedure(s): INTRACARDIAC ELECTROPHYSIOLOGIC 3D MAPPING, STIMULATION PACING HEART, ICAR CATHETER ABLATION ARRHYTHMIA ADD ON, ABLATE L/R ATRIAL FIBRIL W/ISOLATED PULM VEIN, INTRACARD ECHO, THER/DX INTERVENT; Location: [facility]; Surgeon: Norm Salt, MD   CARDIOVERSION N/A 04/08/2017   Procedure: CARDIOVERSION;  Surgeon: Wellington Hampshire, MD;  Location: [facility];  Service: Cardiovascular;  Laterality: N/A;    CHEST TUBE INSERTION N/A 07/11/2018   Procedure: WFUXNA CATH INSERTION;  Surgeon: [name], MD;  Location: [facility];  Service: Thoracic;  Laterality: N/A;   COLONOSCOPY  12/2009   2 small tubular adenomas   CORONARY ANGIOPLASTY WITH STENT PLACEMENT Left 2002   s/p MI x 2 with subsequent PCI x 2 ; stents (unknown type) placed to the p-mRCA, [name], and [name].   CORONARY ARTERY BYPASS GRAFT  09/2000   DRAIN REMOVAL Right 08/10/2018   Procedure: DRAIN REMOVAL;  Surgeon: [name], MD;  Location: [facility];  Service: General;  Laterality: Right;   ECTOPIC PREGNANCY SURGERY     ELECTROMAGNETIC NAVIGATION BROCHOSCOPY N/A 10/06/2015   Procedure: ELECTROMAGNETIC NAVIGATION BRONCHOSCOPY;  Surgeon: Flora Lipps, MD;  Location: [facility];  Service: Cardiopulmonary;  Laterality: N/A;   ENDOBRONCHIAL ULTRASOUND N/A 10/06/2015   Procedure: ENDOBRONCHIAL ULTRASOUND;  Surgeon: Flora Lipps, MD;  Location: [facility];  Service: Cardiopulmonary;  Laterality: N/A;   EYE SURGERY     FLEXIBLE BRONCHOSCOPY Right 07/11/2018   Procedure: FLEXIBLE BRONCHOSCOPY;  Surgeon: [name], MD;  Location: [facility];  Service: Thoracic;  Laterality: Right;   IR KYPHO EA ADDL LEVEL THORACIC OR LUMBAR  12/02/2021   IR KYPHO EA ADDL LEVEL THORACIC OR LUMBAR  12/02/2021   IR KYPHO LUMBAR INC FX REDUCE BONE BX UNI/BIL CANNULATION INC/IMAGING  12/02/2021   IR RADIOLOGIST EVAL & MGMT  11/12/2021   IR RADIOLOGIST EVAL & MGMT  01/05/2022   KYPHOPLASTY N/A 09/28/2016   Procedure: KYPHOPLASTY;  Surgeon: Hessie Knows, MD;  Location: [facility];  Service: Orthopedics;  Laterality: N/A;   KYPHOPLASTY N/A 02/20/2018   Procedure: [id number];  Surgeon: Hessie Knows, MD;  Location: [facility];  Service: Orthopedics;  Laterality: N/A;   PACEMAKER INSERTION  03/2012   REVERSE SHOULDER ARTHROPLASTY Right 06/02/2021   Procedure: REVERSE SHOULDER ARTHROPLASTY;  Surgeon: Corky Mull, MD;  Location: [facility];  Service: Orthopedics;  Laterality:  Right;   SVT ABLATION N/A 07/27/2013   Procedure: SVT ABLATION; Location: [facility]; Surgeon: Rolla Etienne, MD   thorocentesis  12/24/2016   VAGINAL HYSTERECTOMY     partial - left ovary remains   VIDEO ASSISTED THORACOSCOPY Right 07/11/2018   Procedure: VIDEO ASSISTED THORACOSCOPY;  Surgeon: [name], MD;  Location: [facility];  Service: Thoracic;  Laterality: Right;   VIDEO ASSISTED THORACOSCOPY (VATS) W/TALC PLEUADESIS Right 07/11/2018  Procedure: THORACOTOMY PLEURAL BIOPSY WITH TALC PLEURODESIS;  Surgeon: [name], MD;  Location: [facility];  Service: Thoracic;  Laterality: Right;  pleural biopsy     Current Meds  Medication Sig   acetaminophen (TYLENOL) 500 MG tablet Take 500-1,000 mg by mouth every 4 (four) hours as needed for mild pain or moderate pain.   budesonide (PULMICORT) 0.5 MG/2ML nebulizer solution INHALE 2 MILLILITERS BY NEBULIZER 2 TIMES DAILY   cetirizine (ZYRTEC) 10 MG tablet TAKE (1) TABLET BY MOUTH EVERY DAY   ELIQUIS 5 MG TABS tablet TAKE (1) TABLET BY MOUTH TWICE DAILY   fluticasone (FLONASE) 50 MCG/ACT nasal spray Place 2 sprays into both nostrils daily.   furosemide (LASIX) 20 MG tablet Take 3 tablets (60 mg total) by mouth daily.   ipratropium-albuterol (DUONEB) 0.5-2.5 (3) MG/3ML SOLN TAKE 3 MLS BY NEBULIZATION EVERY 6 HOURSAS NEEDED FOR WHEEZING OR SHORTNESS OF BREATH   levothyroxine (SYNTHROID) 88 MCG tablet TAKE (1) TABLET BY MOUTH EVERY DAY BEFORE BREAKFAST   metoprolol succinate (TOPROL-XL) 100 MG 24 hr tablet Take 1 tablet by mouth 2 (two) times daily.   Multiple Vitamins-Minerals (CENTRUM SILVER PO) Take 1 tablet by mouth daily.   OXYGEN Inhale 3 L into the lungs.    potassium chloride (KLOR-CON) 10 MEQ tablet Take 2 tablets (20 mEq total) by mouth 2 (two) times daily.   rosuvastatin (CRESTOR) 5 MG tablet TAKE ONE (1) TABLET BY MOUTH ONCE DAILY   vitamin B-12 (CYANOCOBALAMIN) 1000 MCG tablet Take 1,000 mcg by mouth daily.   YUPELRI 175 MCG/3ML  nebulizer solution Use one vial in nebulizer once daily. Do not mix with other nebulized medications.     Allergies:   Cefdinir, Lovenox [enoxaparin sodium], and Meperidine   Social History   Tobacco Use   Smoking status: Former    Packs/day: 1.00    Years: 40.00    Total pack years: 40.00    Types: Cigarettes    Quit date: 2001    Years since quitting: 22.7   Smokeless tobacco: Never   Tobacco comments:    quit smoking in 08/28/2000. Smoking cessation materials not required  Vaping Use   Vaping Use: Never used  Substance Use Topics   Alcohol use: Yes    Alcohol/week: 10.0 standard drinks of alcohol    Types: 10 Glasses of wine per week    Comment: 1 glass of wine per day   Drug use: No     Family Hx: The patient's family history includes COPD in her brother, mother, and sister; Colon cancer in her brother; Hypertension in her brother and sister; Lung disease in her father; Stroke in her maternal grandmother. There is no history of Heart attack or Breast cancer.  ROS:   Please see the history of present illness.    Review of Systems  Constitutional: Negative.   HENT: Negative.    Respiratory:  Positive for shortness of breath.   Cardiovascular: Negative.   Gastrointestinal: Negative.   Musculoskeletal: Negative.        Gait instability, leg weakness  Neurological: Negative.   Psychiatric/Behavioral: Negative.    All other systems reviewed and are negative.    Labs/Other Tests and Data Reviewed:    Recent Labs: 06/26/2021: B Natriuretic Peptide 644.6 10/07/2021: Magnesium 2.3 11/04/2021: ALT 12 03/23/2022: Hemoglobin 12.2; Platelets 188 05/10/2022: BUN 37; Creatinine, Ser 0.94; Potassium 3.4; Sodium 141   Recent Lipid Panel Lab Results  Component Value Date/Time   CHOL 116 10/07/2021 02:08 PM  TRIG 81 10/07/2021 02:08 PM   HDL 46 10/07/2021 02:08 PM   CHOLHDL 2.5 10/07/2021 02:08 PM   LDLCALC 54 10/07/2021 02:08 PM    Wt Readings from Last 3 Encounters:   05/28/22 134 lb 2 oz (60.8 kg)  05/10/22 129 lb 4 oz (58.6 kg)  03/23/22 131 lb 3.2 oz (59.5 kg)     Exam:    Vital Signs: Vital signs may also be detailed in the HPI BP (!) 100/52 (BP Location: Left Arm, Patient Position: Sitting, Cuff Size: Normal)   Pulse 90   Ht _0  (1.676 m)   Wt 134 lb 2 oz (60.8 kg)   BMI 21.65 kg/m   Constitutional:  oriented to person, place, and time. No distress.  HENT:  Head: Grossly normal Eyes:  no discharge. No scleral icterus.  Neck: No JVD, no carotid bruits  Cardiovascular: Regular rate and rhythm, no murmurs appreciated Pulmonary/Chest: Moderately decreased breath sounds throughout Abdominal: Soft.  no distension.  no tenderness.  Musculoskeletal: Normal range of motion Neurological:  normal muscle tone. Coordination normal. No atrophy Skin: Skin warm and dry Psychiatric: normal affect, pleasant  ASSESSMENT & PLAN:    Chronic combined systolic and diastolic CHF (congestive heart failure) (HCC)  Atrial flutter, unspecified type ([place])  Essential hypertension  Chronic obstructive pulmonary disease, unspecified COPD type ([place])  Permanent atrial fibrillation (HCC)  Sinus node dysfunction ([place])  Pulmonary hypertension, unspecified ([place])  Mitral valve insufficiency, unspecified etiology    Atrial flutter/atrial fibrillation Followed by [facility] Remains in atrial flutter rate 90 bpm, relatively asymptomatic - on Eliquis Continue metoprolol succinate 100 twice daily for rate control  Pulmonary hypertension Moderate elevated heart pressures on echo July 2021 echo October 2022 moderately elevated right heart pressures with dilated IVC Echocardiogram last month at outside facility with severely elevated right heart pressures (not detailed in final impression) Taking Lasix 60 daily Repeat BMP today, recommended she clarify her potassium dosing with [place] Recommended she take potassium 10 mill equivalents 4 pills/day given recent low  potassium 3.4 three weeks ago   Hyperlipidemia - Cholesterol is at goal on the current lipid regimen. No changes to the medications were made.  Chronic diastolic CHF Continue Lasix 60 daily Repeat BMP today   DM type 2, goal A1c below 7 Medically managed Weight stable   COPD exacerbation ([place] Prior history of acute on chronic bronchitis No recent exacerbation On chronic oxygen  Chronic back pain Management per orthopedics Prior kyphoplasty 2022   Total encounter time more than 30 minutes  Greater than 50% was spent in counseling and coordination of care with the patient   Signed, Ida Rogue, MD  05/28/2022 1:44 PM    [place] Office [address], [place], [place] [address]

## 2022-06-01 ENCOUNTER — Ambulatory Visit: Payer: Medicare Other | Attending: Primary Care

## 2022-06-01 DIAGNOSIS — J449 Chronic obstructive pulmonary disease, unspecified: Secondary | ICD-10-CM | POA: Insufficient documentation

## 2022-06-01 DIAGNOSIS — Z87891 Personal history of nicotine dependence: Secondary | ICD-10-CM | POA: Insufficient documentation

## 2022-06-01 LAB — PULMONARY FUNCTION TEST ARMC ONLY
DL/VA % pred: 27 %
DL/VA: 1.11 ml/min/mmHg/L
DLCO unc % pred: 16 %
DLCO unc: 3.31 ml/min/mmHg
FEF 25-75 Post: 0.74 L/sec
FEF 25-75 Pre: 0.72 L/sec
FEF2575-%Change-Post: 2 %
FEF2575-%Pred-Post: 55 %
FEF2575-%Pred-Pre: 54 %
FEV1-%Change-Post: 2 %
FEV1-%Pred-Post: 50 %
FEV1-%Pred-Pre: 49 %
FEV1-Post: 1 L
FEV1-Pre: 0.98 L
FEV1FVC-%Change-Post: 4 %
FEV1FVC-%Pred-Pre: 98 %
FEV6-%Change-Post: -2 %
FEV6-%Pred-Post: 52 %
FEV6-%Pred-Pre: 53 %
FEV6-Post: 1.33 L
FEV6-Pre: 1.36 L
FEV6FVC-%Pred-Post: 106 %
FEV6FVC-%Pred-Pre: 106 %
FVC-%Change-Post: -2 %
FVC-%Pred-Post: 49 %
FVC-%Pred-Pre: 50 %
FVC-Post: 1.33 L
FVC-Pre: 1.36 L
Post FEV1/FVC ratio: 75 %
Post FEV6/FVC ratio: 100 %
Pre FEV1/FVC ratio: 72 %
Pre FEV6/FVC Ratio: 100 %
RV % pred: 86 %
RV: 2.22 L
TLC % pred: 65 %
TLC: 3.5 L

## 2022-06-01 MED ORDER — ALBUTEROL SULFATE (2.5 MG/3ML) 0.083% IN NEBU
2.5000 mg | INHALATION_SOLUTION | Freq: Once | RESPIRATORY_TRACT | Status: AC
  Filled 2022-06-01: qty 3

## 2022-06-03 NOTE — Progress Notes (Signed)
Please let patient know PFTs showed some mild decline in lung function. We will review further at her visit with Dr. Mortimer Fries on 10/12

## 2022-06-10 ENCOUNTER — Ambulatory Visit (INDEPENDENT_AMBULATORY_CARE_PROVIDER_SITE_OTHER): Payer: Medicare Other | Admitting: Internal Medicine

## 2022-06-10 ENCOUNTER — Encounter: Payer: Self-pay | Admitting: Internal Medicine

## 2022-06-10 VITALS — BP 126/74 | HR 89 | Temp 98.0°F | Wt 131.0 lb

## 2022-06-10 DIAGNOSIS — J441 Chronic obstructive pulmonary disease with (acute) exacerbation: Secondary | ICD-10-CM

## 2022-06-10 DIAGNOSIS — J9611 Chronic respiratory failure with hypoxia: Secondary | ICD-10-CM | POA: Diagnosis not present

## 2022-06-10 DIAGNOSIS — I25709 Atherosclerosis of coronary artery bypass graft(s), unspecified, with unspecified angina pectoris: Secondary | ICD-10-CM | POA: Diagnosis not present

## 2022-06-10 MED ORDER — AZITHROMYCIN 250 MG PO TABS: ORAL_TABLET | ORAL | 0 refills | Status: DC

## 2022-06-10 MED ORDER — PREDNISONE 20 MG PO TABS: 20.0000 mg | ORAL_TABLET | Freq: Every day | ORAL | 1 refills | Status: DC

## 2022-06-10 NOTE — Patient Instructions (Signed)
I recommend patient go to the ER for further evaluation Continue oxygen as prescribed Prednisone therapy with Z-Pak therapy for COPD exacerbation Continue nebulized inhaler therapy Avoid sick contacts Avoid secondhand smoke

## 2022-06-10 NOTE — Progress Notes (Signed)
Date: 06/10/2022  MRN# [id number] Jenny Cooper 12-17-1935  PMD - Dr. Gayland Curry Jenny Cooper is a 86 y.o. old female seen in follow up for  LLL mass/COPD/Fibrosis      Synopsis - 86 year old female first evaluated by pulmonary in early 2016 for chronic cough, found to have right hilar mass, biopsy of mass and pathology specimens positive for squamous cell, stage IIIa. Now status post chemoradiation, recently found to have left lower lobe mass, with chronic cough, chronic antibiotics. Biopsy, EBUS, ENB, of left lower lobe mass negative for malignancy - she has an adverse reaction to prednisone therapy wears her A. fib goes out of control She is currently on amiodarone therapy    chronic cough, along with postradiation fibrosis. Has moderate COPD on PFT's 2 years ago Ratio 57% FEV1 58%  05/2022  recent PFT shows FEV1 of 49% predicted   ONCOLOGY history Stage III squamous cell lung cancer s/p chemo-RT- 2016. Status post bronchoscopy in February 2017-negative for malignancy; May 5th PET scan shows no significant concerns for recurrent malignancy; shows radiation changes; pleural effusion    CC Severe hypoxia upon presentation to clinic today Follow-up assessment for shortness of breath and COPD Follow-up hypoxic respiratory failure    HPI Upon arrival to clinic patient's O2 sats were 70% on pulse oxygen therapy And placed on continuous oxygen to 6 L nasal cannula O2 sats came up to 91% Patient does have wheezing and increased shortness of breath Denies fever No chest pain  Respiratory capacity has significantly declined She can no longer take traditional inhaler therapy  continue current nebulized therapy  Patient has very poor respiratory insufficiency     Allergies:  Cefdinir, Lovenox [enoxaparin sodium], and Meperidine       Physical Examination:  BP 126/74 (BP Location: Left Arm, Cuff Size: Normal)   Pulse 89   Temp 98 F (36.7 C) (Temporal)    Wt 131 lb (59.4 kg)   SpO2 (!) 77% Comment: 6L applied increased to 94%  BMI 21.14 kg/m       Review of Systems: Gen:  Denies  fever, sweats, chills weight loss  HEENT: Denies blurred vision, double vision, ear pain, eye pain, hearing loss, nose bleeds, sore throat Cardiac:  No dizziness, chest pain or heaviness, chest tightness,edema, No JVD Resp:   No cough, -sputum production, +shortness of breath,+wheezing, -hemoptysis,  Other:  All other systems negative    Physical Examination:   General Appearance: No distress  EYES PERRLA, EOM intact.   NECK Supple, No JVD Pulmonary: +wheezing CardiovascularNormal S1,S2.  No m/r/g.   Abdomen: Benign, Soft, non-tender. ALL OTHER ROS ARE NEGATIVE      ECHO 03/2014 MILD LV DYSFUNCTION  NORMAL RIGHT VENTRICULAR SYSTOLIC FUNCTION VALVULAR REGURGITATION: MILD MR, TRIVIAL PR, MILD TR PROSTHETIC VALVE(S): PROSTHETIC MV RING   CT chest 11/2017 Right hilar and suprahilar scarring associated with scarring in the medial right lung, likely treatment related from prior radiation therapy.    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 fib with CHF with chronic hypoxic respiratory failure  SEVERE COPD Gold stage D Patient with respiratory insufficiency unable to tolerate traditional inhaler therapy Continue Pulmicort nebs Yupelri L LAMA This regimen has helped tremendously At this time patient  had severe hypoxia upon presentation to the office however patient refuses to go to the ER for further evaluation Patient is wheezing on presentation but is not in any apparent or significant respiratory demise or distress Patient has very poor respiratory  insufficiency   CHRONIC Hypoxic respiratory failure Uses and benefits from oxygen therapy needs this for survival   A. fib CHF Continue oral AC Follow-up with cardiology    H/o stage 3A non-small cell lung cancer Follow up oncology as scheduled     MEDICATION ADJUSTMENTS/LABS AND TESTS ORDERED: I recommend patient go to the ER for further evaluation Continue oxygen as prescribed Prednisone therapy with Z-Pak therapy for COPD exacerbation Continue nebulized inhaler therapy Avoid sick contacts Avoid secondhand smoke  CURRENT MEDICATIONS REVIEWED AT LENGTH WITH PATIENT TODAY   Follow-up in 3 months  Time spent 33 minutes  Corban Kistler Patricia Pesa, M.D.  [facility]  Medical Director [place] Director [facility] Cardio-Pulmonary Department

## 2022-07-13 ENCOUNTER — Ambulatory Visit: Payer: Medicare Other | Attending: Family | Admitting: Family

## 2022-07-13 ENCOUNTER — Encounter: Payer: Self-pay | Admitting: Family

## 2022-07-13 ENCOUNTER — Other Ambulatory Visit
Admission: RE | Admit: 2022-07-13 | Discharge: 2022-07-13 | Disposition: A | Discharge status: Home / Self Care | Payer: Medicare Other | Source: Ambulatory Visit | Attending: Family | Admitting: Family

## 2022-07-13 VITALS — BP 109/67 | HR 91 | Resp 16 | Wt 137.2 lb

## 2022-07-13 DIAGNOSIS — Z7901 Long term (current) use of anticoagulants: Secondary | ICD-10-CM | POA: Diagnosis not present

## 2022-07-13 DIAGNOSIS — E039 Hypothyroidism, unspecified: Secondary | ICD-10-CM | POA: Diagnosis not present

## 2022-07-13 DIAGNOSIS — I252 Old myocardial infarction: Secondary | ICD-10-CM | POA: Insufficient documentation

## 2022-07-13 DIAGNOSIS — I480 Paroxysmal atrial fibrillation: Secondary | ICD-10-CM | POA: Diagnosis not present

## 2022-07-13 DIAGNOSIS — I110 Hypertensive heart disease with heart failure: Secondary | ICD-10-CM | POA: Diagnosis not present

## 2022-07-13 DIAGNOSIS — R140 Abdominal distension (gaseous): Secondary | ICD-10-CM | POA: Insufficient documentation

## 2022-07-13 DIAGNOSIS — I25709 Atherosclerosis of coronary artery bypass graft(s), unspecified, with unspecified angina pectoris: Secondary | ICD-10-CM | POA: Diagnosis not present

## 2022-07-13 DIAGNOSIS — I4891 Unspecified atrial fibrillation: Secondary | ICD-10-CM | POA: Insufficient documentation

## 2022-07-13 DIAGNOSIS — E785 Hyperlipidemia, unspecified: Secondary | ICD-10-CM | POA: Insufficient documentation

## 2022-07-13 DIAGNOSIS — I82502 Chronic embolism and thrombosis of unspecified deep veins of left lower extremity: Secondary | ICD-10-CM | POA: Diagnosis not present

## 2022-07-13 DIAGNOSIS — Z87891 Personal history of nicotine dependence: Secondary | ICD-10-CM | POA: Insufficient documentation

## 2022-07-13 DIAGNOSIS — I5022 Chronic systolic (congestive) heart failure: Secondary | ICD-10-CM

## 2022-07-13 DIAGNOSIS — K219 Gastro-esophageal reflux disease without esophagitis: Secondary | ICD-10-CM | POA: Insufficient documentation

## 2022-07-13 DIAGNOSIS — I10 Essential (primary) hypertension: Secondary | ICD-10-CM | POA: Diagnosis not present

## 2022-07-13 DIAGNOSIS — J449 Chronic obstructive pulmonary disease, unspecified: Secondary | ICD-10-CM | POA: Diagnosis not present

## 2022-07-13 LAB — BASIC METABOLIC PANEL
Anion gap: 9 (ref 5–15)
BUN: 35 mg/dL — ABNORMAL HIGH (ref 8–23)
CO2: 33 mmol/L — ABNORMAL HIGH (ref 22–32)
Calcium: 9.4 mg/dL (ref 8.9–10.3)
Chloride: 98 mmol/L (ref 98–111)
Creatinine, Ser: 0.95 mg/dL (ref 0.44–1.00)
GFR, Estimated: 59 mL/min — ABNORMAL LOW (ref >60)
Glucose, Bld: 99 mg/dL (ref 70–99)
Potassium: 4 mmol/L (ref 3.5–5.1)
Sodium: 140 mmol/L (ref 135–145)

## 2022-07-13 NOTE — Patient Instructions (Signed)
Continue weighing daily and call for an overnight weight gain of 3 pounds or more or a weekly weight gain of more than 5 pounds.   If you have voicemail, please make sure your mailbox is cleaned out so that we may leave a message and please make sure to listen to any voicemails.     

## 2022-07-13 NOTE — Progress Notes (Signed)
Patient ID: Jenny Cooper, female    DOB: [birth year], 86 y.o.   MRN: [id number]  HPI  Ms Rawling is a 86 y/o female with a history of atrial fibrillation (cardioversion), MI, lung cancer, hypothyroidism, HTN, hyperlipidemia, blood clots, GERD, COPD, spinal compression, previous tobacco use and chronic heart failure.   Echo report from 10/27/21 reviewed and showed an EF of 40%. Echo report from 06/24/21 reviewed and showed an EF of 40-45% and moderately elevated PA pressure of 56.0. Echo done 04/06/17 reviewed and shows an EF of 30-35% along with trivial AR, moderate MR and moderately elevated PA pressure of 50 mm Hg.   Cardiac catheterization done 04/28/15 shows severe underlying three-vessel CAD with occluded SVG likely to OM. No good options for revascularization. Native RCA is occluded with left-to-right collaterals. Recommended optimizing medical therapy.   Has not been admitted or been in the ED in the last 6 months.   She presents today for a follow-up visit with a chief complaint of moderate shortness of breath with minimal exertion. Describes this as chronic in nature. She has associated cough, wheezing, abdominal distention and slight weight gain along with this. She denies any difficulty sleeping, dizziness, palpitations, pedal edema, chest pain or fatigue.   Did increase her furosemide to 28m BID for the last week or so due to weight gain and swelling. Did not increase her potassium. She did recently eat some kielbasa sausage and said that it tasted salty to her and to her grandson. She says that she only ate a few pieces of it.   She says that the last time she saw pulmonology, she was told that there wasn't anything else they could do for her COPD.   Past Medical History:  Diagnosis Date   Acute midline low back pain without sciatica 08/13/2016   Acute on chronic respiratory failure with hypoxia ([facility] 08/21/2018   Acute respiratory failure with hypoxia ([facility] 12/11/2017   Allergy     Aortic atherosclerosis ([facility]    Atypical atrial flutter ([facility]    a.) s/p ablation 07/27/2013 and 05/10/2016   Balance problem    CAD (coronary artery disease)    a. s/p MI x 2 in 2002 s/p PCI x 2 in 2002; b. s/p 2v CABG 2002; c. stress echo 07/2004 w/ evi of pos & inf infarct & no evi of ischemia; d. 4/08 dipyridamole scan w/ multiple areas of infarct, no ischemia, EF 49%; e. cath 04/28/15 3v CAD, med Rx rec, no targets for revasc, LM lum irregs, pLAD 30%, 100%, ost-pLCx 60%, [name] 99%, OM2 100%, p-mRCA 90%, [name] 100% L-R collats, VG-mLAD irregs, VG-OM2 oc   Chronic anticoagulation    a.) Apixaban   Chronic systolic CHF (congestive heart failure) ([facility]    a. echo 03/2015: EF 30-35%, sev ant/inf/pos HK, in mild to mod MR   Closed fracture of right proximal humerus 91/51/7616  Complication of anesthesia    a.) postoperative apnea. b.) postoperative hypoxia.   Compressed spine fracture ([facility] 08/06/2016   a.) L2, T11, T12   COPD (chronic obstructive pulmonary disease) (HCC)    Deep vein thrombosis (DVT) of left lower extremity ([facility] 12/2001   Fracture of multiple pubic rami, right, closed, initial encounter ([facility] 05/27/2016   a.) RIGHT superior and inferior pubic rami fractures s/p mechanical fall.   GERD (gastroesophageal reflux disease)    HLD (hyperlipidemia)    HTN (hypertension)    Hypothyroidism    Ischemic cardiomyopathy    Mitral regurgitation  a. s/p mitral ring placement 09/2000; b. echo 09/2010: EF 50%, inf HK, post AK, mild MR, prosthetic mitral valve ring w/ peak gradient of 10 mmHg; b. echo 2/13: EF 50%, mild MR/TR      Myocardial infarction ([place]) 2002   a.) x 2 in 2002; PCI with stents (unknown type) placed to the p-mRCA, [name], and [name].   Neuropathy    Osteoarthritis    Osteoporosis    Oxygen dependent    a.) 3 L/[place]   Pacemaker    a. MDT 2002; b. generator replacement 2013; c. followed by Dr. Omelia Blackwater, MD   PAF (paroxysmal atrial fibrillation) [facility])    a.) s/p DCCV  on 04/08/2017. b.) on apixaban   Personal history of chemotherapy    Personal history of radiation therapy    Pneumonia    S/P CABG x 2 2002   a.) SVG-LAD, SVG-OM   Squamous cell carcinoma of right lung ([place]) 01/03/2015   a.) stage IIIa. b.) s/p concurrent chemotherapy (carboplatin + paclitaxel) and XRT (IMRT 6000 cGy)   SVT (supraventricular tachycardia)    Type II diabetes mellitus with complication ([place]) 82/42/3536   Past Surgical History:  Procedure Laterality Date   APPENDECTOMY     CARDIAC CATHETERIZATION N/A 04/28/2015   Procedure: Left Heart Cath and Coronary Angiography;  Surgeon: Wellington Hampshire, MD;  Location: [facility];  Service: Cardiovascular;  Laterality: N/A;   CARDIAC ELECTROPHYSIOLOGY STUDY AND ABLATION N/A 05/10/2016   Procedure(s): INTRACARDIAC ELECTROPHYSIOLOGIC 3D MAPPING, STIMULATION PACING HEART, ICAR CATHETER ABLATION ARRHYTHMIA ADD ON, ABLATE L/R ATRIAL FIBRIL W/ISOLATED PULM VEIN, INTRACARD ECHO, THER/DX INTERVENT; Location: [facility]; Surgeon: Norm Salt, MD   CARDIOVERSION N/A 04/08/2017   Procedure: CARDIOVERSION;  Surgeon: Wellington Hampshire, MD;  Location: [facility];  Service: Cardiovascular;  Laterality: N/A;   CHEST TUBE INSERTION N/A 07/11/2018   Procedure: RWERXV CATH INSERTION;  Surgeon: [name], MD;  Location: [facility];  Service: Thoracic;  Laterality: N/A;   COLONOSCOPY  12/2009   2 small tubular adenomas   CORONARY ANGIOPLASTY WITH STENT PLACEMENT Left 2002   s/p MI x 2 with subsequent PCI x 2 ; stents (unknown type) placed to the p-mRCA, [name], and [name].   CORONARY ARTERY BYPASS GRAFT  09/2000   DRAIN REMOVAL Right 08/10/2018   Procedure: DRAIN REMOVAL;  Surgeon: [name], MD;  Location: [facility];  Service: General;  Laterality: Right;   ECTOPIC PREGNANCY SURGERY     ELECTROMAGNETIC NAVIGATION BROCHOSCOPY N/A 10/06/2015   Procedure: ELECTROMAGNETIC NAVIGATION BRONCHOSCOPY;  Surgeon: Flora Lipps, MD;  Location: [facility];   Service: Cardiopulmonary;  Laterality: N/A;   ENDOBRONCHIAL ULTRASOUND N/A 10/06/2015   Procedure: ENDOBRONCHIAL ULTRASOUND;  Surgeon: Flora Lipps, MD;  Location: [facility];  Service: Cardiopulmonary;  Laterality: N/A;   EYE SURGERY     FLEXIBLE BRONCHOSCOPY Right 07/11/2018   Procedure: FLEXIBLE BRONCHOSCOPY;  Surgeon: [name], MD;  Location: [facility];  Service: Thoracic;  Laterality: Right;   IR KYPHO EA ADDL LEVEL THORACIC OR LUMBAR  12/02/2021   IR KYPHO EA ADDL LEVEL THORACIC OR LUMBAR  12/02/2021   IR KYPHO LUMBAR INC FX REDUCE BONE BX UNI/BIL CANNULATION INC/IMAGING  12/02/2021   IR RADIOLOGIST EVAL & MGMT  11/12/2021   IR RADIOLOGIST EVAL & MGMT  01/05/2022   KYPHOPLASTY N/A 09/28/2016   Procedure: KYPHOPLASTY;  Surgeon: Hessie Knows, MD;  Location: [facility];  Service: Orthopedics;  Laterality: N/A;   KYPHOPLASTY N/A 02/20/2018   Procedure: [id number];  Surgeon:  Hessie Knows, MD;  Location: [facility];  Service: Orthopedics;  Laterality: N/A;   PACEMAKER INSERTION  03/2012   REVERSE SHOULDER ARTHROPLASTY Right 06/02/2021   Procedure: REVERSE SHOULDER ARTHROPLASTY;  Surgeon: Corky Mull, MD;  Location: [facility];  Service: Orthopedics;  Laterality: Right;   SVT ABLATION N/A 07/27/2013   Procedure: SVT ABLATION; Location: [facility]; Surgeon: Rolla Etienne, MD   thorocentesis  12/24/2016   VAGINAL HYSTERECTOMY     partial - left ovary remains   VIDEO ASSISTED THORACOSCOPY Right 07/11/2018   Procedure: VIDEO ASSISTED THORACOSCOPY;  Surgeon: [name], MD;  Location: [facility];  Service: Thoracic;  Laterality: Right;   VIDEO ASSISTED THORACOSCOPY (VATS) W/TALC PLEUADESIS Right 07/11/2018   Procedure: THORACOTOMY PLEURAL BIOPSY WITH TALC PLEURODESIS;  Surgeon: [name], MD;  Location: [facility];  Service: Thoracic;  Laterality: Right;  pleural biopsy   Family History  Problem Relation Age of Onset   COPD Mother        sister, and brother   Lung disease Father    Stroke  Maternal Grandmother    Hypertension Sister    COPD Sister    Hypertension Brother    COPD Brother    Colon cancer Brother    Heart attack Neg Hx    Breast cancer Neg Hx    Social History   Tobacco Use   Smoking status: Former    Packs/day: 1.00    Years: 40.00    Total pack years: 40.00    Types: Cigarettes    Quit date: 2001    Years since quitting: 22.8   Smokeless tobacco: Never   Tobacco comments:    quit smoking in 08/28/2000. Smoking cessation materials not required  Substance Use Topics   Alcohol use: Yes    Alcohol/week: 10.0 standard drinks of alcohol    Types: 10 Glasses of wine per week    Comment: 1 glass of wine per day   Allergies  Allergen Reactions   Cefdinir Rash    Had hives after completing treatment - unsure if it was the cause.  Has received 1st generation cephalosporin many times (04/24/2012, 07/27/2013, 09/28/2016, 02/20/2018, 07/11/2018, 08/10/2018 without documented ADRs.   Lovenox [Enoxaparin Sodium] Itching   Meperidine Other (See Comments)    Other Reaction: pt does not like how it makes her feel Other reaction(s): Other (See Comments) Other Reaction: pt does not like how it makes her feel   Prior to Admission medications   Medication Sig Start Date End Date Taking? Authorizing Provider  budesonide (PULMICORT) 0.5 MG/2ML nebulizer solution INHALE 2 MILLILITERS BY NEBULIZER 2 TIMES DAILY 07/17/21  Yes Kasa, Maretta Bees, MD  cetirizine (ZYRTEC) 10 MG tablet TAKE (1) TABLET BY MOUTH EVERY DAY 06/15/18  Yes Glean Hess, MD  ELIQUIS 5 MG TABS tablet TAKE (1) TABLET BY MOUTH TWICE DAILY 02/22/22  Yes Gollan, Kathlene November, MD  furosemide (LASIX) 20 MG tablet Take 3 tablets (60 mg total) by mouth daily. Patient taking differently: Take 40 mg by mouth 2 (two) times daily. 05/11/22  Yes Kamdon Reisig A, FNP  ipratropium-albuterol (DUONEB) 0.5-2.5 (3) MG/3ML SOLN TAKE 3 MLS BY NEBULIZATION EVERY 6 HOURSAS NEEDED FOR WHEEZING OR SHORTNESS OF BREATH 04/22/22   Yes Kasa, Maretta Bees, MD  levothyroxine (SYNTHROID) 88 MCG tablet TAKE (1) TABLET BY MOUTH EVERY DAY BEFORE BREAKFAST 01/08/21  Yes Glean Hess, MD  metoprolol succinate (TOPROL-XL) 100 MG 24 hr tablet Take 1 tablet by mouth 2 (two) times daily. 02/04/22  Yes  [provider]  Multiple Vitamins-Minerals (CENTRUM SILVER PO) Take 1 tablet by mouth daily.   Yes [provider]  OXYGEN Inhale 3 L into the lungs.    Yes [provider]  potassium chloride (KLOR-CON) 10 MEQ tablet Take 2 tablets (20 mEq total) by mouth 2 (two) times daily. 05/28/22  Yes Gollan, Kathlene November, MD  rosuvastatin (CRESTOR) 5 MG tablet TAKE (1) TABLET BY MOUTH EVERY DAY 05/28/22  Yes Gollan, Kathlene November, MD  vitamin B-12 (CYANOCOBALAMIN) 1000 MCG tablet Take 1,000 mcg by mouth daily.   Yes [provider]  YUPELRI 175 MCG/3ML nebulizer solution Use one vial in nebulizer once daily. Do not mix with other nebulized medications. 03/18/22  Yes Kasa, Maretta Bees, MD  arformoterol (BROVANA) 15 MCG/2ML NEBU Take 2 mLs (15 mcg total) by nebulization 2 (two) times daily. Patient not taking: Reported on 03/23/2022 03/05/22   Martyn Ehrich, NP  azithromycin (ZITHROMAX Z-PAK) 250 MG tablet Take 2 tablets on Day 1 and then 1 tablet daily till gone. Patient not taking: Reported on 07/13/2022 06/10/22   Flora Lipps, MD    Review of Systems  Constitutional:  Negative for appetite change and fatigue.  HENT:  Negative for congestion, rhinorrhea and sore throat.   Eyes: Negative.   Respiratory:  Positive for cough, shortness of breath and wheezing. Negative for chest tightness.   Cardiovascular:  Negative for chest pain, palpitations and leg swelling.  Gastrointestinal:  Positive for abdominal distention. Negative for abdominal pain.  Endocrine: Negative.   Genitourinary: Negative.   Musculoskeletal:  Negative for arthralgias, back pain, myalgias and neck pain.  Skin: Negative.   Allergic/Immunologic: Negative.    Neurological:  Negative for dizziness and light-headedness.  Hematological:  Negative for adenopathy. Does not bruise/bleed easily.  Psychiatric/Behavioral:  Negative for dysphoric mood and sleep disturbance (sleeping on 1 pillow). The patient is not nervous/anxious.    Vitals:   07/13/22 1424  BP: 109/67  Pulse: 91  Resp: 16  SpO2: 92%  Weight: 137 lb 4 oz (62.3 kg)   Wt Readings from Last 3 Encounters:  07/13/22 137 lb 4 oz (62.3 kg)  06/10/22 131 lb (59.4 kg)  05/28/22 134 lb 2 oz (60.8 kg)   Lab Results  Component Value Date   CREATININE 0.89 05/28/2022   CREATININE 0.94 05/10/2022   CREATININE 0.99 03/23/2022   Physical Exam Vitals and nursing note reviewed.  Constitutional:      Appearance: Normal appearance. She is well-developed.  HENT:     Head: Normocephalic and atraumatic.  Neck:     Vascular: No JVD.  Cardiovascular:     Rate and Rhythm: Normal rate. Rhythm irregular.     Heart sounds: Normal heart sounds.  Pulmonary:     Effort: Pulmonary effort is normal.     Breath sounds: No wheezing or rales.  Abdominal:     General: There is distension.     Palpations: Abdomen is soft.     Tenderness: There is no abdominal tenderness.  Musculoskeletal:        General: No tenderness.     Cervical back: Normal range of motion and neck supple.     Right lower leg: No edema.     Left lower leg: No edema.  Skin:    General: Skin is warm and dry.  Neurological:     General: No focal deficit present.     Mental Status: She is alert and oriented to person, place, and time.  Psychiatric:  Behavior: Behavior normal.        Thought Content: Thought content normal.    Assessment & Plan:   1: Chronic heart failure with reduced ejection fraction- - NYHA class III - minimally fluid overloaded today with weight gain and abdominal distention - weighing daily; reminded to call for an overnight weight gain of >2 pounds or a weekly weight gain of >5 pounds - weight  up 8 pounds from last visit here 3 months ago; she had increased her furosemide to 33m BID but did not increase her potassium - check BMP today - adding "some" salt but only to eggs; says that she doesn't eat eggs on a daily basis; did recently eat some salty tasting kielbasa sausage - on GDMT of metoprolol - has history of hypotension so may not be able to add entresto, SGLTs or spironolactone  - Saw cardiology (Rockey Situ 05/28/22 - BNP from 06/26/21 was 644.6 - has not gotten her flu vaccine yet  2: Atrial fibrillation- - on apixaban - saw EP (Glennon Mac 07/09/21  3: HTN- - BP looks good (109/67) - saw PCP ([facility] Melia 11/10/21 - BMP from 05/28/22 reviewed and showed sodium 140, potassium 4.0, creatinine 0.89 and GFR >60 - rechecking this today to see if she needs more potassium after increasing her furosemide  4: COPD Stage D- - saw pulmonology (Kasa) 06/10/22 - wearing oxygen at 3L around the clock   Medication list reviewed.   Return in 6 months, sooner if needed

## 2022-07-19 ENCOUNTER — Ambulatory Visit: Payer: Medicare Other

## 2022-07-21 ENCOUNTER — Ambulatory Visit: Payer: Medicare Other

## 2022-08-02 ENCOUNTER — Other Ambulatory Visit: Payer: Self-pay | Admitting: Internal Medicine

## 2022-08-02 DIAGNOSIS — J441 Chronic obstructive pulmonary disease with (acute) exacerbation: Secondary | ICD-10-CM

## 2022-09-06 ENCOUNTER — Ambulatory Visit (INDEPENDENT_AMBULATORY_CARE_PROVIDER_SITE_OTHER): Payer: Medicare Other

## 2022-09-06 VITALS — Temp 97.7°F

## 2022-09-06 DIAGNOSIS — Z0000 Encounter for general adult medical examination without abnormal findings: Secondary | ICD-10-CM | POA: Diagnosis not present

## 2022-09-06 NOTE — Patient Instructions (Signed)

## 2022-09-06 NOTE — Progress Notes (Signed)
I connected with  Jenny Cooper on 09/06/22 by a audio enabled telemedicine application and verified that I am speaking with the correct person using two identifiers.  Patient Location: Home  Provider Location: Office/Clinic  I discussed the limitations of evaluation and management by telemedicine. The patient expressed understanding and agreed to proceed.  Subjective:   Jenny Cooper is a 87 y.o. female who presents for Medicare Annual (Subsequent) preventive examination.  Review of Systems    Per HPI unless specifically indicated below.  Cardiac Risk Factors include: advanced age (>25mn, >>56women);female gender, Hyperlipidemia, Pulmonary hypertension, CAD, and CHF.          Objective:       07/13/2022    2:24 PM 06/10/2022    2:19 PM 05/28/2022    1:32 PM  Vitals with BMI  Height   _0   Weight 137 lbs 4 oz 131 lbs 134 lbs 2 oz  BMI  298.33282.50 Systolic [id number] Diastolic 67 74 52  Pulse 91 89 90    Today's Vitals   09/06/22 1418  Temp: 97.7 F (36.5 C)  TempSrc: Oral   There is no height or weight on file to calculate BMI.     03/23/2022    1:05 PM 12/02/2021    8:03 AM 11/04/2021   12:58 PM 09/22/2021    1:17 PM 07/15/2021   11:43 AM 06/26/2021    4:25 PM 06/26/2021    4:00 PM  Advanced Directives  Does Patient Have a Medical Advance Directive? No No No Yes Yes  Yes  Type of APersonnel officerLiving will HElyLiving will   Does patient want to make changes to medical advance directive?      No - Patient declined   Copy of [place] Chart?     No - copy requested No - copy requested   Would patient like information on creating a medical advance directive? No - Patient declined No - Patient declined         Current Medications (verified) Outpatient Encounter Medications as of 09/06/2022  Medication Sig   budesonide (PULMICORT) 0.5 MG/2ML nebulizer solution USE 1  AMPULE (2 ML )VIA NEBULIZER TWO TIMES DAILY   cetirizine (ZYRTEC) 10 MG tablet TAKE (1) TABLET BY MOUTH EVERY DAY   ELIQUIS 5 MG TABS tablet TAKE (1) TABLET BY MOUTH TWICE DAILY   furosemide (LASIX) 20 MG tablet Take 3 tablets (60 mg total) by mouth daily. (Patient taking differently: Take 40 mg by mouth 2 (two) times daily.)   ipratropium-albuterol (DUONEB) 0.5-2.5 (3) MG/3ML SOLN TAKE 3 MLS BY NEBULIZATION EVERY 6 HOURSAS NEEDED FOR WHEEZING OR SHORTNESS OF BREATH   levothyroxine (SYNTHROID) 88 MCG tablet TAKE (1) TABLET BY MOUTH EVERY DAY BEFORE BREAKFAST   metoprolol succinate (TOPROL-XL) 100 MG 24 hr tablet Take 1 tablet by mouth 2 (two) times daily.   Multiple Vitamins-Minerals (CENTRUM SILVER PO) Take 1 tablet by mouth daily.   OXYGEN Inhale 4 L into the lungs.   potassium chloride (KLOR-CON) 10 MEQ tablet Take 2 tablets (20 mEq total) by mouth 2 (two) times daily.   rosuvastatin (CRESTOR) 5 MG tablet TAKE (1) TABLET BY MOUTH EVERY DAY   vitamin B-12 (CYANOCOBALAMIN) 1000 MCG tablet Take 1,000 mcg by mouth daily.   YUPELRI 175 MCG/3ML nebulizer solution Use one vial in nebulizer once daily. Do not mix with other  nebulized medications.   Facility-Administered Encounter Medications as of 09/06/2022  Medication   albuterol (PROVENTIL) (2.5 MG/3ML) 0.083% nebulizer solution 2.5 mg   sodium chloride 0.9 % injection 10 mL   sodium chloride flush (NS) 0.9 % injection 10 mL    Allergies (verified) Cefdinir, Lovenox [enoxaparin sodium], and Meperidine   History: Past Medical History:  Diagnosis Date   Acute midline low back pain without sciatica 08/13/2016   Acute on chronic respiratory failure with hypoxia ([place]) 08/21/2018   Acute respiratory failure with hypoxia ([place]) 12/11/2017   Allergy    Aortic atherosclerosis ([place])    Atypical atrial flutter ([place])    a.) s/p ablation 07/27/2013 and 05/10/2016   Balance problem    CAD (coronary artery disease)    a. s/p MI x 2 in 2002 s/p PCI x  2 in 2002; b. s/p 2v CABG 2002; c. stress echo 07/2004 w/ evi of pos & inf infarct & no evi of ischemia; d. 4/08 dipyridamole scan w/ multiple areas of infarct, no ischemia, EF 49%; e. cath 04/28/15 3v CAD, med Rx rec, no targets for revasc, LM lum irregs, pLAD 30%, 100%, ost-pLCx 60%, [name] 99%, OM2 100%, p-mRCA 90%, [name] 100% L-R collats, VG-mLAD irregs, VG-OM2 oc   Chronic anticoagulation    a.) Apixaban   Chronic systolic CHF (congestive heart failure) ([place])    a. echo 03/2015: EF 30-35%, sev ant/inf/pos HK, in mild to mod MR   Closed fracture of right proximal humerus 03/09/6578   Complication of anesthesia    a.) postoperative apnea. b.) postoperative hypoxia.   Compressed spine fracture ([place]) 08/06/2016   a.) L2, T11, T12   COPD (chronic obstructive pulmonary disease) (H[facility])    Deep vein thrombosis (DVT) of left lower extremity ([place]) 12/2001   Fracture of multiple pubic rami, right, closed, initial encounter ([place]) 05/27/2016   a.) RIGHT superior and inferior pubic rami fractures s/p mechanical fall.   GERD (gastroesophageal reflux disease)    HLD (hyperlipidemia)    HTN (hypertension)    Hypothyroidism    Ischemic cardiomyopathy    Mitral regurgitation    a. s/p mitral ring placement 09/2000; b. echo 09/2010: EF 50%, inf HK, post AK, mild MR, prosthetic mitral valve ring w/ peak gradient of 10 mmHg; b. echo 2/13: EF 50%, mild MR/TR      Myocardial infarction ([place]) 2002   a.) x 2 in 2002; PCI with stents (unknown type) placed to the p-mRCA, [name], and [name].   Neuropathy    Osteoarthritis    Osteoporosis    Oxygen dependent    a.) 3 L/[place]   Pacemaker    a. MDT 2002; b. generator replacement 2013; c. followed by Dr. Omelia Blackwater, MD   PAF (paroxysmal atrial fibrillation) [facility])    a.) s/p DCCV on 04/08/2017. b.) on apixaban   Personal history of chemotherapy    Personal history of radiation therapy    Pneumonia    S/P CABG x 2 2002   a.) SVG-LAD, SVG-OM   Squamous cell carcinoma of  right lung ([place]) 01/03/2015   a.) stage IIIa. b.) s/p concurrent chemotherapy (carboplatin + paclitaxel) and XRT (IMRT 6000 cGy)   SVT (supraventricular tachycardia)    Type II diabetes mellitus with complication ([place]) 03/83/3383   Past Surgical History:  Procedure Laterality Date   APPENDECTOMY     CARDIAC CATHETERIZATION N/A 04/28/2015   Procedure: Left Heart Cath and Coronary Angiography;  Surgeon: Wellington Hampshire, MD;  Location: [facility]  [facility];  Service: Cardiovascular;  Laterality: N/A;   CARDIAC ELECTROPHYSIOLOGY STUDY AND ABLATION N/A 05/10/2016   Procedure(s): INTRACARDIAC ELECTROPHYSIOLOGIC 3D MAPPING, STIMULATION PACING HEART, ICAR CATHETER ABLATION ARRHYTHMIA ADD ON, ABLATE L/R ATRIAL FIBRIL W/ISOLATED PULM VEIN, INTRACARD ECHO, THER/DX INTERVENT; Location: [facility]; Surgeon: Norm Salt, MD   CARDIOVERSION N/A 04/08/2017   Procedure: CARDIOVERSION;  Surgeon: Wellington Hampshire, MD;  Location: [facility] OR[facility];  Service: Cardiovascular;  Laterality: N/A;   CHEST TUBE INSERTION N/A 07/11/2018   Procedure: JOINOM CATH INSERTION;  Surgeon: [name], MD;  Location: [facility] OR[facility];  Service: Thoracic;  Laterality: N/A;   COLONOSCOPY  12/2009   2 small tubular adenomas   CORONARY ANGIOPLASTY WITH STENT PLACEMENT Left 2002   s/p MI x 2 with subsequent PCI x 2 ; stents (unknown type) placed to the p-mRCA, [name], and [name].   CORONARY ARTERY BYPASS GRAFT  09/2000   DRAIN REMOVAL Right 08/10/2018   Procedure: DRAIN REMOVAL;  Surgeon: [name], MD;  Location: [facility];  Service: General;  Laterality: Right;   ECTOPIC PREGNANCY SURGERY     ELECTROMAGNETIC NAVIGATION BROCHOSCOPY N/A 10/06/2015   Procedure: ELECTROMAGNETIC NAVIGATION BRONCHOSCOPY;  Surgeon: Flora Lipps, MD;  Location: [facility];  Service: Cardiopulmonary;  Laterality: N/A;   ENDOBRONCHIAL ULTRASOUND N/A 10/06/2015   Procedure: ENDOBRONCHIAL ULTRASOUND;  Surgeon: Flora Lipps, MD;  Location: [facility];  Service: Cardiopulmonary;   Laterality: N/A;   EYE SURGERY     FLEXIBLE BRONCHOSCOPY Right 07/11/2018   Procedure: FLEXIBLE BRONCHOSCOPY;  Surgeon: [name], MD;  Location: [facility];  Service: Thoracic;  Laterality: Right;   IR KYPHO EA ADDL LEVEL THORACIC OR LUMBAR  12/02/2021   IR KYPHO EA ADDL LEVEL THORACIC OR LUMBAR  12/02/2021   IR KYPHO LUMBAR INC FX REDUCE BONE BX UNI/BIL CANNULATION INC/IMAGING  12/02/2021   IR RADIOLOGIST EVAL & MGMT  11/12/2021   IR RADIOLOGIST EVAL & MGMT  01/05/2022   KYPHOPLASTY N/A 09/28/2016   Procedure: KYPHOPLASTY;  Surgeon: Hessie Knows, MD;  Location: [facility];  Service: Orthopedics;  Laterality: N/A;   KYPHOPLASTY N/A 02/20/2018   Procedure: [id number];  Surgeon: Hessie Knows, MD;  Location: [facility];  Service: Orthopedics;  Laterality: N/A;   PACEMAKER INSERTION  03/2012   REVERSE SHOULDER ARTHROPLASTY Right 06/02/2021   Procedure: REVERSE SHOULDER ARTHROPLASTY;  Surgeon: Corky Mull, MD;  Location: [facility];  Service: Orthopedics;  Laterality: Right;   SVT ABLATION N/A 07/27/2013   Procedure: SVT ABLATION; Location: [facility]; Surgeon: Rolla Etienne, MD   thorocentesis  12/24/2016   VAGINAL HYSTERECTOMY     partial - left ovary remains   VIDEO ASSISTED THORACOSCOPY Right 07/11/2018   Procedure: VIDEO ASSISTED THORACOSCOPY;  Surgeon: [name], MD;  Location: [facility];  Service: Thoracic;  Laterality: Right;   VIDEO ASSISTED THORACOSCOPY (VATS) W/TALC PLEUADESIS Right 07/11/2018   Procedure: THORACOTOMY PLEURAL BIOPSY WITH TALC PLEURODESIS;  Surgeon: [name], MD;  Location: [facility];  Service: Thoracic;  Laterality: Right;  pleural biopsy   Family History  Problem Relation Age of Onset   COPD Mother        sister, and brother   Lung disease Father    Stroke Maternal Grandmother    Hypertension Sister    COPD Sister    Hypertension Brother    COPD Brother    Colon cancer Brother    Heart attack Neg Hx    Breast cancer Neg Hx    Social History    Socioeconomic History   Marital  status: Widowed    Spouse name: Not on file   Number of children: 6   Years of education: Not on file   Highest education level: Some college, no degree  Occupational History   Occupation: Retired  Tobacco Use   Smoking status: Former    Packs/day: 1.00    Years: 40.00    Total pack years: 40.00    Types: Cigarettes    Quit date: 2001    Years since quitting: 23.0   Smokeless tobacco: Never   Tobacco comments:    quit smoking in 08/28/2000. Smoking cessation materials not required  Vaping Use   Vaping Use: Never used  Substance and Sexual Activity   Alcohol use: Yes    Alcohol/week: 10.0 standard drinks of alcohol    Types: 10 Glasses of wine per week    Comment: 1 glass of wine per day   Drug use: No   Sexual activity: Never  Other Topics Concern   Not on file  Social History Narrative   Pt's son lives with her   Social Determinants of Health   Financial Resource Strain: Low Risk  (09/06/2022)   Overall Financial Resource Strain (CARDIA)    Difficulty of Paying Living Expenses: Not hard at all  Food Insecurity: No Food Insecurity (09/06/2022)   Hunger Vital Sign    Worried About Running Out of Food in the Last Year: Never true    Ran Out of Food in the Last Year: Never true  Transportation Needs: No Transportation Needs (09/06/2022)   PRAPARE - Hydrologist (Medical): No    Lack of Transportation (Non-Medical): No  Physical Activity: Inactive (09/06/2022)   Exercise Vital Sign    Days of Exercise per Week: 0 days    Minutes of Exercise per Session: 0 min  Stress: No Stress Concern Present (09/06/2022)   [place]    Feeling of Stress : Not at all  Social Connections: Moderately Isolated (09/06/2022)   Social Connection and Isolation Panel [NHANES]    Frequency of Communication with Friends and Family: More than three times a week    Frequency  of Social Gatherings with Friends and Family: Once a week    Attends Religious Services: 1 to 4 times per year    Active Member of [employer] or Organizations: No    Attends Archivist Meetings: Never    Marital Status: Widowed    Tobacco Counseling Counseling given: No Tobacco comments: quit smoking in 08/28/2000. Smoking cessation materials not required   Clinical Intake:     Pain : No/denies pain     Nutritional Status: BMI 25 -29 Overweight Nutritional Risks: None  How often do you need to have someone help you when you read instructions, pamphlets, or other written materials from your doctor or pharmacy?: 1 - Never  Diabetic?No   Interpreter Needed?: No  Information entered by :: Donnie Mesa, cMA   Activities of Daily Living    09/06/2022    2:24 PM 05/10/2022    1:01 PM  In your present state of health, do you have any difficulty performing the following activities:  Hearing? 1 1  Vision? 0 0  Difficulty concentrating or making decisions? 1 0  Walking or climbing stairs? 1 1  Comment  Shortness of breath and back pain  Dressing or bathing? 0 0  Doing errands, shopping? 1 0    Patient Care Team: Halina Maidens  Lemmie Evens, MD as PCP - General (Internal Medicine) Minna Merritts, MD as PCP - Cardiology (Cardiology) Cammie Sickle, MD as Consulting Physician (Internal Medicine) Alisa Graff, FNP as Nurse Practitioner (Cardiology) Flora Lipps, MD as Consulting Physician (Pulmonary Disease) Marvia Pickles, MD as Consulting Physician (Cardiology) Poggi, Marshall Cork, MD as Consulting Physician (Orthopedic Surgery)  Indicate any recent Medical Services you may have received from other than [facility] providers in the past year (date may be approximate). The patient was seen on 07/30/22 at [facility] for fitting adjustment of cardiac pacemaker.    Assessment:   This is a routine wellness examination for [place].  Hearing/Vision screen  Pt c/o  mild hearing difficulty when someone speaks too fast; declines hearing aids Vision Screening - Comments:: [facility] screen[facility]   Dietary issues and exercise activities discussed: Current Exercise Habits: The patient does not participate in regular exercise at present, Exercise limited by: respiratory conditions(s)   Goals Addressed   None    Depression Screen    09/06/2022    2:22 PM 05/10/2022    1:00 PM 11/10/2021    2:00 PM 10/07/2021    1:40 PM 07/15/2021   11:40 AM 11/21/2020    1:37 PM 06/02/2020   10:57 AM  PHQ 2/9 Scores  PHQ - 2 Score 2 0 0 0 2 0 0  PHQ- 9 Score   3 0 2 0     Fall Risk    09/06/2022    2:24 PM 05/10/2022    1:00 PM 11/10/2021    2:00 PM 10/14/2021    1:01 PM 10/07/2021    1:41 PM  [place] in the past year? 0 0 1 0 1  Number falls in past yr: 0 0 1 0 0  Injury with Fall? 0 0 1 0 1  Risk for fall due to : No Fall Risks No Fall Risks History of fall(s);Impaired balance/gait  History of fall(s)  Follow up Falls evaluation completed Falls evaluation completed;Falls prevention discussed Falls evaluation completed Falls evaluation completed Falls evaluation completed    FALL RISK PREVENTION PERTAINING TO THE HOME:  Any stairs in or around the home? No  If so, are there any without handrails? No  Home free of loose throw rugs in walkways, pet beds, electrical cords, etc? Yes  Adequate lighting in your home to reduce risk of falls? Yes   ASSISTIVE DEVICES UTILIZED TO PREVENT FALLS:  Life alert? Yes  Use of a cane, walker or w/c? Yes  Grab bars in the bathroom? Yes  Shower chair or bench in shower? Yes  Elevated toilet seat or a handicapped toilet? No   TIMED UP AND GO:  Was the test performed?  unable to perform, virtual telephonic visit  .  Cognitive Function:        09/06/2022    2:25 PM 06/02/2020   11:02 AM 05/30/2019   11:01 AM 08/25/2017    8:46 AM  6CIT Screen  What Year? 0 points 0 points 0 points 0 points   What month? 0 points 0 points 0 points 0 points  What time? 0 points 0 points 0 points 0 points  Count back from 20 0 points 0 points 0 points 0 points  Months in reverse 0 points 2 points 2 points 2 points  Repeat phrase 0 points 0 points 0 points 0 points  Total Score 0 points 2 points 2 points 2 points  Immunizations Immunization History  Administered Date(s) Administered   Fluad Quad(high Dose 65+) 06/02/2020, 10/07/2021   Influenza, High Dose Seasonal PF 06/17/2014, 07/04/2018, 07/01/2019   Influenza,inj,Quad PF,6+ Mos 07/01/2014, 05/28/2015, 05/31/2016, 04/21/2017   Influenza-Unspecified 07/20/2011, 05/03/2012, 07/01/2014, 05/28/2015, 05/31/2016, 04/21/2017   Moderna Sars-Covid-2 Vaccination 09/09/2019, 10/10/2019, 06/20/2020   Pneumococcal Conjugate-13 04/21/2017   Pneumococcal Polysaccharide-23 08/03/2011   Zoster, Live 05/01/2011    TDAP status: Due, Education has been provided regarding the importance of this vaccine. Advised may receive this vaccine at local pharmacy or Health Dept. Aware to provide a copy of the vaccination record if obtained from local pharmacy or Health Dept. Verbalized acceptance and understanding.  Flu Vaccine status: Up to date  Pneumococcal vaccine status: Up to date  Covid-19 vaccine status: Information provided on how to obtain vaccines.   Qualifies for Shingles Vaccine? Yes   Zostavax completed Yes   Shingrix Completed?: No.    Education has been provided regarding the importance of this vaccine. Patient has been advised to call insurance company to determine out of pocket expense if they have not yet received this vaccine. Advised may also receive vaccine at local pharmacy or Health Dept. Verbalized acceptance and understanding.  Screening Tests Health Maintenance  Topic Date Due   DTaP/Tdap/Td (1 - Tdap) Never done   Zoster Vaccines- Shingrix (1 of 2) Never done   COLONOSCOPY (Pts 45-75yr Insurance coverage will need to be confirmed)   11/25/2014   MAMMOGRAM  03/15/2019   INFLUENZA VACCINE  03/30/2022   COVID-19 Vaccine (4 - 2023-24 season) 04/30/2022   Medicare Annual Wellness (AWV)  09/07/2023   Pneumonia Vaccine 85 Years old  Completed   DEXA SCAN  Completed   HPV VACCINES  Aged Out    Health Maintenance  Health Maintenance Due  Topic Date Due   DTaP/Tdap/Td (1 - Tdap) Never done   Zoster Vaccines- Shingrix (1 of 2) Never done   COLONOSCOPY (Pts 45-465yrInsurance coverage will need to be confirmed)  11/25/2014   MAMMOGRAM  03/15/2019   INFLUENZA VACCINE  03/30/2022   COVID-19 Vaccine (4 - 2023-24 season) 04/30/2022    Colorectal cancer screening: No longer required.   Mammogram status: No longer required due to age.  DEXA Scan: 01/10/2017   Lung Cancer Screening: (Low Dose CT Chest recommended if Age 85-80ears, 30 pack-year currently smoking OR have quit w/in 15years.) does not qualify.   Lung Cancer Screening Referral: not applicable   Additional Screening:  Hepatitis C Screening: does not qualify  Vision Screening: Recommended annual ophthalmology exams for early detection of glaucoma and other disorders of the eye. Is the patient up to date with their annual eye exam?  Yes  Who is the provider or what is the name of the office in which the patient attends annual eye exams[facility] is not [facility] with a provider, would they like to be referred to a provider to establish care? No .   Dental Screening: Recommended annual dental exams for proper oral hygiene  Community Resource Referral / Chronic Care Management: CRR required this visit?  No   CCM required this visit?  No      Plan:     I have personally reviewed and noted the following in the patient's chart:   Medical and social history Use of alcohol, tobacco or illicit drugs  Current medications and supplements including opioid prescriptions. Patient is not currently taking opioid prescriptions. Functional  ability and status Nutritional status  Physical activity Advanced directives List of other physicians Hospitalizations, surgeries, and ER visits in previous 12 months Vitals Screenings to include cognitive, depression, and falls Referrals and appointments  In addition, I have reviewed and discussed with patient certain preventive protocols, quality metrics, and best practice recommendations. A written personalized care plan for preventive services as well as general preventive health recommendations were provided to patient.    Jenny Cooper , Thank you for taking time to come for your Medicare Wellness Visit. I appreciate your ongoing commitment to your health goals. Please review the following plan we discussed and let me know if I can assist you in the future.   These are the goals we discussed:  Goals      Cut out extra servings     DIET - INCREASE WATER INTAKE     Recommend to drink at least 6-8 8oz glasses of water per day     Prevent falls     Recommend preventing falls with balance and strengthening exercises.         This is a list of the screening recommended for you and due dates:  Health Maintenance  Topic Date Due   DTaP/Tdap/Td vaccine (1 - Tdap) Never done   COVID-19 Vaccine (4 - 2023-24 season) 04/30/2022   Mammogram  09/07/2023*   Colon Cancer Screening  09/07/2023*   Zoster (Shingles) Vaccine (2 of 2) 10/13/2022   Medicare Annual Wellness Visit  09/07/2023   Pneumonia Vaccine  Completed   Flu Shot  Completed   DEXA scan (bone density measurement)  Completed   HPV Vaccine  Aged Out  *Topic was postponed. The date shown is not the original due date.     Wilson Singer, [place]   09/06/2022   Nurse Notes: Approximately 30-minute Non-Face -To-Face Medicare Wellness Visit. The pt complains of increased SOB x 3 days. History of chronic cough, COPD, and Stage 3 Non-small cell lung cancer. She increased her O2 from 3L- 4L, because SPO2 was averaging mid to high 80's.  Her current oxygen level on 4L is averaging 91%-93%. The pt stated that is a comfortable level for her. She said her family is considering taking her to the hospital because they are concerned with the increased SOB. The patient is declining to go at this time. She said her Pulmonologist told her at her last visit that they could not do anything else for her in the office and that if she developed respiratory distress she needed to go to the ER. The pt agreed if her symptoms persist or get worse she will go to the ER.

## 2022-09-16 ENCOUNTER — Other Ambulatory Visit: Payer: Self-pay | Admitting: Cardiovascular Disease

## 2022-09-16 ENCOUNTER — Other Ambulatory Visit: Payer: Self-pay | Admitting: Internal Medicine

## 2022-09-16 NOTE — Telephone Encounter (Signed)
Refill Request.

## 2022-09-29 ENCOUNTER — Other Ambulatory Visit: Payer: Self-pay | Admitting: Cardiovascular Disease

## 2022-09-29 DIAGNOSIS — I480 Paroxysmal atrial fibrillation: Secondary | ICD-10-CM

## 2022-09-30 NOTE — Telephone Encounter (Signed)
Prescription refill request for Eliquis received. Indication: Aflutter Last office visit: 07/13/22 [facility])  Scr: 0.95 (07/13/22)  Age: 87 Weight: 62.3kg  Appropriate dose. Refill sent.

## 2022-09-30 NOTE — Telephone Encounter (Signed)
Refill request

## 2022-11-23 ENCOUNTER — Encounter: Payer: Self-pay | Admitting: Cardiovascular Disease

## 2022-11-23 ENCOUNTER — Other Ambulatory Visit: Payer: Self-pay | Admitting: *Deleted

## 2022-11-23 MED ORDER — ROSUVASTATIN CALCIUM 5 MG PO TABS: ORAL_TABLET | ORAL | 3 refills | Status: DC

## 2022-11-25 ENCOUNTER — Other Ambulatory Visit: Payer: Self-pay | Admitting: Cardiovascular Disease

## 2022-11-25 NOTE — Telephone Encounter (Signed)
Please contact pt for future appointment. 

## 2022-11-26 NOTE — Telephone Encounter (Signed)
Scheduled 04/26

## 2022-11-30 ENCOUNTER — Other Ambulatory Visit: Payer: Self-pay | Admitting: Cardiovascular Disease

## 2022-12-08 DIAGNOSIS — S42009A Fracture of unspecified part of unspecified clavicle, initial encounter for closed fracture: Secondary | ICD-10-CM

## 2022-12-08 HISTORY — DX: Fracture of unspecified part of unspecified clavicle, initial encounter for closed fracture: S42.009A

## 2022-12-11 ENCOUNTER — Ambulatory Visit
Admission: EM | Admit: 2022-12-11 | Discharge: 2022-12-11 | Disposition: A | Discharge status: Home / Self Care | Payer: Medicare Other | Attending: Family Medicine | Admitting: Family Medicine

## 2022-12-11 ENCOUNTER — Ambulatory Visit (INDEPENDENT_AMBULATORY_CARE_PROVIDER_SITE_OTHER): Payer: Medicare Other

## 2022-12-11 DIAGNOSIS — S42035A Nondisplaced fracture of lateral end of left clavicle, initial encounter for closed fracture: Secondary | ICD-10-CM

## 2022-12-11 DIAGNOSIS — M546 Pain in thoracic spine: Secondary | ICD-10-CM | POA: Diagnosis not present

## 2022-12-11 DIAGNOSIS — M542 Cervicalgia: Secondary | ICD-10-CM | POA: Diagnosis not present

## 2022-12-11 DIAGNOSIS — W19XXXA Unspecified fall, initial encounter: Secondary | ICD-10-CM

## 2022-12-11 NOTE — Discharge Instructions (Signed)
Rest. Ice.  Wear sling.  Please call [facility] clinic Orthopedics [phone]) OR [facility] [phone]) for an appt.

## 2022-12-11 NOTE — ED Triage Notes (Signed)
X3 days  Pt states that she fell and injured her left shoulder.

## 2022-12-11 NOTE — ED Provider Notes (Signed)
[facility]    CSN: [id number] Arrival date & time: 12/11/22  1357      History   Chief Complaint Chief Complaint  Patient presents with   Clavicle Injury    Had a fall Wednesday evening into a wall.  Thought was a bruise, but continues to hurt. - Entered by patient    HPI 87 year old female with an extensive PMH presents for evaluation of the above.  Patient suffered a fall Wednesday evening.  Fell into a wall.  She has bruising to the area of the left clavicle and also to the left posterior shoulder.  She states that she is having pain of the neck and left shoulder.  She has had decreased range of motion and pain which has persisted.  Patient has a history of prior compression fractures.  She has known osteoporosis.  She is concerned about fracture.  Pain currently 3/10 in severity.  Past Medical History:  Diagnosis Date   Acute midline low back pain without sciatica 08/13/2016   Acute on chronic respiratory failure with hypoxia 08/21/2018   Acute respiratory failure with hypoxia 12/11/2017   Allergy    Aortic atherosclerosis    Atypical atrial flutter    a.) s/p ablation 07/27/2013 and 05/10/2016   Balance problem    CAD (coronary artery disease)    a. s/p MI x 2 in 2002 s/p PCI x 2 in 2002; b. s/p 2v CABG 2002; c. stress echo 07/2004 w/ evi of pos & inf infarct & no evi of ischemia; d. 4/08 dipyridamole scan w/ multiple areas of infarct, no ischemia, EF 49%; e. cath 04/28/15 3v CAD, med Rx rec, no targets for revasc, LM lum irregs, pLAD 30%, 100%, ost-pLCx 60%, [name] 99%, OM2 100%, p-mRCA 90%, [name] 100% L-R collats, VG-mLAD irregs, VG-OM2 oc   Chronic anticoagulation    a.) Apixaban   Chronic systolic CHF (congestive heart failure)    a. echo 03/2015: EF 30-35%, sev ant/inf/pos HK, in mild to mod MR   Closed fracture of right proximal humerus 05/28/2021   Complication of anesthesia    a.) postoperative apnea. b.) postoperative hypoxia.   Compressed spine fracture  08/06/2016   a.) L2, T11, T12   COPD (chronic obstructive pulmonary disease)    Deep vein thrombosis (DVT) of left lower extremity 12/2001   Fracture of multiple pubic rami, right, closed, initial encounter 05/27/2016   a.) RIGHT superior and inferior pubic rami fractures s/p mechanical fall.   GERD (gastroesophageal reflux disease)    HLD (hyperlipidemia)    HTN (hypertension)    Hypothyroidism    Ischemic cardiomyopathy    Mitral regurgitation    a. s/p mitral ring placement 09/2000; b. echo 09/2010: EF 50%, inf HK, post AK, mild MR, prosthetic mitral valve ring w/ peak gradient of 10 mmHg; b. echo 2/13: EF 50%, mild MR/TR      Myocardial infarction 2002   a.) x 2 in 2002; PCI with stents (unknown type) placed to the p-mRCA, [name], and [name].   Neuropathy    Osteoarthritis    Osteoporosis    Oxygen dependent    a.) 3 L/Goessel   Pacemaker    a. MDT 2002; b. generator replacement 2013; c. followed by Dr. Macon Large, MD   PAF (paroxysmal atrial fibrillation)    a.) s/p DCCV on 04/08/2017. b.) on apixaban   Personal history of chemotherapy    Personal history of radiation therapy    Pneumonia    S/P  CABG x 2 2002   a.) SVG-LAD, SVG-OM   Squamous cell carcinoma of right lung 01/03/2015   a.) stage IIIa. b.) s/p concurrent chemotherapy (carboplatin + paclitaxel) and XRT (IMRT 6000 cGy)   SVT (supraventricular tachycardia)    Type II diabetes mellitus with complication 11/17/2015    Patient Active Problem List   Diagnosis Date Noted   Hypokalemia 06/23/2021   Status post reverse arthroplasty of shoulder, right 06/02/2021   Age-related cataract of both eyes 04/29/2021   Squamous cell lung cancer 02/11/2020   Drug-induced polyneuropathy 01/23/2020   Low vitamin B12 level 01/14/2019   Pulmonary hypertension, unspecified 12/01/2018   Normocytic anemia 10/17/2018   Recurrent pleural effusion on right 07/11/2018   Environmental and seasonal allergies 12/20/2017   Coronary artery disease of  bypass graft of native heart with stable angina pectoris 04/21/2017   Chronic combined systolic and diastolic CHF (congestive heart failure) 04/14/2017   Sinus node dysfunction 04/06/2017   Back pain of thoracolumbar region 03/15/2017   Falls frequently 03/05/2017   Cancer of hilus of right lung 12/14/2016   Compression fracture of L2 lumbar vertebra 08/31/2016   Cardiomyopathy 04/29/2016   Hx of adenomatous colonic polyps 11/17/2015   Radiation pneumonitis 10/21/2015   Mitral regurgitation    HLD (hyperlipidemia)    Paroxysmal supraventricular tachycardia (HCC)    NSTEMI (non-ST elevated myocardial infarction) (HCC)    Arteriosclerosis of coronary artery 01/11/2015   Hypothyroidism (acquired) 01/11/2015   Disorder of peripheral nervous system 10/04/2014   Cervical radiculopathy, chronic 10/04/2014   Atrial flutter 11/16/2013   Moderate COPD (chronic obstructive pulmonary disease) 04/24/2012   Essential hypertension 08/03/2011   Pacemaker 08/03/2011    Past Surgical History:  Procedure Laterality Date   APPENDECTOMY     CARDIAC CATHETERIZATION N/A 04/28/2015   Procedure: Left Heart Cath and Coronary Angiography;  Surgeon: Iran Ouch, MD;  Location: [facility];  Service: Cardiovascular;  Laterality: N/A;   CARDIAC ELECTROPHYSIOLOGY STUDY AND ABLATION N/A 05/10/2016   Procedure(s): INTRACARDIAC ELECTROPHYSIOLOGIC 3D MAPPING, STIMULATION PACING HEART, ICAR CATHETER ABLATION ARRHYTHMIA ADD ON, ABLATE L/R ATRIAL FIBRIL W/ISOLATED PULM VEIN, INTRACARD ECHO, THER/DX INTERVENT; Location: [facility]; Surgeon: Sedalia Muta, MD   CARDIOVERSION N/A 04/08/2017   Procedure: CARDIOVERSION;  Surgeon: Iran Ouch, MD;  Location: [facility];  Service: Cardiovascular;  Laterality: N/A;   CHEST TUBE INSERTION N/A 07/11/2018   Procedure: TJQZES CATH INSERTION;  Surgeon: Hulda Marin, MD;  Location: [facility];  Service: Thoracic;  Laterality: N/A;   COLONOSCOPY  12/2009   2 small tubular  adenomas   CORONARY ANGIOPLASTY WITH STENT PLACEMENT Left 2002   s/p MI x 2 with subsequent PCI x 2 ; stents (unknown type) placed to the p-mRCA, [name], and [name].   CORONARY ARTERY BYPASS GRAFT  09/2000   DRAIN REMOVAL Right 08/10/2018   Procedure: DRAIN REMOVAL;  Surgeon: Hulda Marin, MD;  Location: [facility];  Service: General;  Laterality: Right;   ECTOPIC PREGNANCY SURGERY     ELECTROMAGNETIC NAVIGATION BROCHOSCOPY N/A 10/06/2015   Procedure: ELECTROMAGNETIC NAVIGATION BRONCHOSCOPY;  Surgeon: Erin Fulling, MD;  Location: [facility];  Service: Cardiopulmonary;  Laterality: N/A;   ENDOBRONCHIAL ULTRASOUND N/A 10/06/2015   Procedure: ENDOBRONCHIAL ULTRASOUND;  Surgeon: Erin Fulling, MD;  Location: [facility];  Service: Cardiopulmonary;  Laterality: N/A;   EYE SURGERY     FLEXIBLE BRONCHOSCOPY Right 07/11/2018   Procedure: FLEXIBLE BRONCHOSCOPY;  Surgeon: Hulda Marin, MD;  Location: [facility];  Service: Thoracic;  Laterality: Right;  IR KYPHO EA ADDL LEVEL THORACIC OR LUMBAR  12/02/2021   IR KYPHO EA ADDL LEVEL THORACIC OR LUMBAR  12/02/2021   IR KYPHO LUMBAR INC FX REDUCE BONE BX UNI/BIL CANNULATION INC/IMAGING  12/02/2021   IR RADIOLOGIST EVAL & MGMT  11/12/2021   IR RADIOLOGIST EVAL & MGMT  01/05/2022   KYPHOPLASTY N/A 09/28/2016   Procedure: KYPHOPLASTY;  Surgeon: Kennedy Bucker, MD;  Location: [facility];  Service: Orthopedics;  Laterality: N/A;   KYPHOPLASTY N/A 02/20/2018   Procedure: [id number];  Surgeon: Kennedy Bucker, MD;  Location: [facility];  Service: Orthopedics;  Laterality: N/A;   PACEMAKER INSERTION  03/2012   REVERSE SHOULDER ARTHROPLASTY Right 06/02/2021   Procedure: REVERSE SHOULDER ARTHROPLASTY;  Surgeon: Christena Flake, MD;  Location: [facility];  Service: Orthopedics;  Laterality: Right;   SVT ABLATION N/A 07/27/2013   Procedure: SVT ABLATION; Location: [facility]; Surgeon: Valeria Batman, MD   thorocentesis  12/24/2016   VAGINAL HYSTERECTOMY     partial - left ovary remains    VIDEO ASSISTED THORACOSCOPY Right 07/11/2018   Procedure: VIDEO ASSISTED THORACOSCOPY;  Surgeon: Hulda Marin, MD;  Location: [facility];  Service: Thoracic;  Laterality: Right;   VIDEO ASSISTED THORACOSCOPY (VATS) W/TALC PLEUADESIS Right 07/11/2018   Procedure: THORACOTOMY PLEURAL BIOPSY WITH TALC PLEURODESIS;  Surgeon: Hulda Marin, MD;  Location: [facility];  Service: Thoracic;  Laterality: Right;  pleural biopsy    OB History   No obstetric history on file.      Home Medications    Prior to Admission medications   Medication Sig Start Date End Date Taking? Authorizing Provider  budesonide (PULMICORT) 0.5 MG/2ML nebulizer solution USE 1 AMPULE (2 ML )VIA NEBULIZER TWO TIMES DAILY 08/02/22  Yes Salena Saner, MD  cetirizine (ZYRTEC) 10 MG tablet TAKE (1) TABLET BY MOUTH EVERY DAY 06/15/18  Yes Reubin Milan, MD  ELIQUIS 5 MG TABS tablet TAKE (1) TABLET BY MOUTH TWICE DAILY 09/30/22  Yes Gollan, Tollie Pizza, MD  furosemide (LASIX) 20 MG tablet Take 2 tablets (40 mg total) by mouth 2 (two) times daily. 09/16/22  Yes Hackney, Tina A, FNP  ipratropium-albuterol (DUONEB) 0.5-2.5 (3) MG/3ML SOLN TAKE 3 MLS BY NEBULIZATION EVERY 6 HOURSAS NEEDED FOR WHEEZING OR SHORTNESS OF BREATH 09/16/22  Yes Kasa, Wallis Bamberg, MD  levothyroxine (SYNTHROID) 88 MCG tablet TAKE (1) TABLET BY MOUTH EVERY DAY BEFORE BREAKFAST 01/08/21  Yes Reubin Milan, MD  metoprolol succinate (TOPROL-XL) 100 MG 24 hr tablet Take 1 tablet by mouth 2 (two) times daily. 02/04/22  Yes [provider]  Multiple Vitamins-Minerals (CENTRUM SILVER PO) Take 1 tablet by mouth daily.   Yes [provider]  OXYGEN Inhale 4 L into the lungs.   Yes [provider]  potassium chloride (KLOR-CON) 10 MEQ tablet TAKE (2) TABLETS BY MOUTH TWICE DAILY 12/01/22  Yes Gollan, Tollie Pizza, MD  rosuvastatin (CRESTOR) 5 MG tablet TAKE (1) TABLET BY MOUTH EVERY DAY 11/23/22  Yes Gollan, Tollie Pizza, MD  vitamin B-12 (CYANOCOBALAMIN) 1000  MCG tablet Take 1,000 mcg by mouth daily.   Yes [provider]  YUPELRI 175 MCG/3ML nebulizer solution Use one vial in nebulizer once daily. Do not mix with other nebulized medications. 03/18/22  Yes Erin Fulling, MD    Family History Family History  Problem Relation Age of Onset   COPD Mother        sister, and brother   Lung disease Father    Stroke Maternal Grandmother    Hypertension  Sister    COPD Sister    Hypertension Brother    COPD Brother    Colon cancer Brother    Heart attack Neg Hx    Breast cancer Neg Hx     Social History Social History   Tobacco Use   Smoking status: Former    Packs/day: 1.00    Years: 40.00    Additional pack years: 0.00    Total pack years: 40.00    Types: Cigarettes    Quit date: 2001    Years since quitting: 23.2   Smokeless tobacco: Never   Tobacco comments:    quit smoking in 08/28/2000. Smoking cessation materials not required  Vaping Use   Vaping Use: Never used  Substance Use Topics   Alcohol use: Yes    Alcohol/week: 10.0 standard drinks of alcohol    Types: 10 Glasses of wine per week    Comment: 1 glass of wine per day   Drug use: No     Allergies   Cefdinir, Lovenox [enoxaparin sodium], and Meperidine   Review of Systems Review of Systems Per HPI  Physical Exam Triage Vital Signs ED Triage Vitals  Enc Vitals Group     BP 12/11/22 1409 106/68     Pulse Rate 12/11/22 1409 95     Resp 12/11/22 1409 16     Temp 12/11/22 1409 97.8 F (36.6 C)     Temp src --      SpO2 12/11/22 1409 (!) 77 %     Weight 12/11/22 1407 130 lb (59 kg)     Height 12/11/22 1407 5\' 6"  (1.676 m)     Head Circumference --      Peak Flow --      Pain Score 12/11/22 1407 3     Pain Loc --      Pain Edu? --      Excl. in GC? --    No data found.  Updated Vital Signs BP 106/68 (BP Location: Right Arm)   Pulse 95   Temp 97.8 F (36.6 C)   Resp 16   Ht 5\' 6"  (1.676 m)   Wt 59 kg   SpO2 (!) 77%   BMI 20.98 kg/m    Visual Acuity Right Eye Distance:   Left Eye Distance:   Bilateral Distance:    Right Eye Near:   Left Eye Near:    Bilateral Near:     Physical Exam Vitals and nursing note reviewed.  Constitutional:      General: She is not in acute distress. HENT:     Head: Normocephalic and atraumatic.  Eyes:     General:        Right eye: No discharge.        Left eye: No discharge.     Conjunctiva/sclera: Conjunctivae normal.  Pulmonary:     Effort: No respiratory distress.  Musculoskeletal:     Comments: Patient with bruising over the left clavicle.  Clavicle is not tender to palpation.  Patient has posterior shoulder tenderness to palpation particular over the left trapezius.  Mild tenderness of the midline of the upper thoracic spine.  Neurological:     Mental Status: She is alert.  Psychiatric:        Mood and Affect: Mood normal.        Behavior: Behavior normal.      UC Treatments / Results  Labs (all labs ordered are listed, but only abnormal results are displayed) Labs Reviewed -  No data to display  EKG   Radiology DG Thoracic Spine 2 View  Result Date: 12/11/2022 CLINICAL DATA:  Fall, injury EXAM: THORACIC SPINE 2 VIEWS COMPARISON:  03/25/2022 FINDINGS: Exaggerated thoracic kyphosis. Moderate chronic compression deformity of T9. Mild compression deformities of T7 and T8. Prior cement augmentation of the T11 through L3 levels. No new or progressive fracture is identified when compared with the previous study. IMPRESSION: Multiple chronic thoracolumbar compression deformities. No new or progressive fracture is identified when compared with the previous study. Electronically Signed   By: Duanne Guess D.O.   On: 12/11/2022 15:51   DG Cervical Spine Complete  Result Date: 12/11/2022 CLINICAL DATA:  Fall, injury EXAM: CERVICAL SPINE - COMPLETE 4+ VIEW COMPARISON:  05/19/2021 FINDINGS: There is no evidence of cervical spine fracture or prevertebral soft tissue swelling.  Alignment is maintained without traumatic listhesis. Degenerative spondylosis is most pronounced at the C5-6 and C6-7 levels. Distal left clavicular fracture. IMPRESSION: 1. No evidence of acute fracture or traumatic listhesis of the cervical spine. 2. Distal left clavicular fracture. Electronically Signed   By: Duanne Guess D.O.   On: 12/11/2022 15:47   DG Clavicle Left  Result Date: 12/11/2022 CLINICAL DATA:  Fall, injury EXAM: LEFT CLAVICLE - 2+ VIEWS; LEFT SHOULDER - 2+ VIEW COMPARISON:  None Available. FINDINGS: Cortical discontinuity along the distal aspect of the left clavicle, evaluation is somewhat limited secondary to over penetration on clavicular views. This is better seen on grashey view of the shoulder. Left shoulder intact without evidence of fracture. Glenohumeral and acromioclavicular joint alignment maintained without dislocation. Mild degenerative changes. No appreciable soft tissue abnormality. IMPRESSION: 1. Appearance suspicious for a nondisplaced fracture of the distal left clavicle. Correlate with point tenderness. 2. Left shoulder intact without evidence of fracture. Electronically Signed   By: Duanne Guess D.O.   On: 12/11/2022 15:46   DG Shoulder Left  Result Date: 12/11/2022 CLINICAL DATA:  Fall, injury EXAM: LEFT CLAVICLE - 2+ VIEWS; LEFT SHOULDER - 2+ VIEW COMPARISON:  None Available. FINDINGS: Cortical discontinuity along the distal aspect of the left clavicle, evaluation is somewhat limited secondary to over penetration on clavicular views. This is better seen on grashey view of the shoulder. Left shoulder intact without evidence of fracture. Glenohumeral and acromioclavicular joint alignment maintained without dislocation. Mild degenerative changes. No appreciable soft tissue abnormality. IMPRESSION: 1. Appearance suspicious for a nondisplaced fracture of the distal left clavicle. Correlate with point tenderness. 2. Left shoulder intact without evidence of fracture.  Electronically Signed   By: Duanne Guess D.O.   On: 12/11/2022 15:46    Procedures Procedures (including critical care time)  Medications Ordered in UC Medications - No data to display  Initial Impression / Assessment and Plan / UC Course  I have reviewed the triage vital signs and the nursing notes.  Pertinent labs & imaging results that were available during my care of the patient were reviewed by me and considered in my medical decision making (see chart for details).    87 year old female presents for evaluation after suffering a fall.  X-rays were obtained and were independently reviewed by me.  Nondisplaced distal clavicle fracture noted.  Patient also has multiple chronic thoracic compression fractures.  Patient placed in a sling.  Patient will be using Tylenol for pain.  Advised to follow-up with orthopedics.  Supportive.  Final Clinical Impressions(s) / UC Diagnoses   Final diagnoses:  Closed nondisplaced fracture of acromial end of left clavicle, initial encounter  Discharge Instructions      Rest. Ice.  Wear sling.  Please call [facility] Orthopedics [phone]) OR [facility] [phone]) for an appt.      ED Prescriptions   None    PDMP not reviewed this encounter.   Tommie Sams, [place] 12/11/22 [phone]

## 2022-12-24 ENCOUNTER — Ambulatory Visit: Payer: Medicare Other | Attending: Cardiology | Admitting: Cardiology

## 2022-12-24 ENCOUNTER — Encounter: Payer: Self-pay | Admitting: Cardiology

## 2022-12-24 ENCOUNTER — Other Ambulatory Visit
Admission: RE | Admit: 2022-12-24 | Discharge: 2022-12-24 | Disposition: A | Discharge status: Home / Self Care | Payer: Medicare Other | Source: Ambulatory Visit | Attending: Cardiology | Admitting: Cardiology

## 2022-12-24 VITALS — BP 102/63 | HR 92 | Ht 66.0 in | Wt 134.6 lb

## 2022-12-24 DIAGNOSIS — E782 Mixed hyperlipidemia: Secondary | ICD-10-CM

## 2022-12-24 DIAGNOSIS — R799 Abnormal finding of blood chemistry, unspecified: Secondary | ICD-10-CM | POA: Diagnosis not present

## 2022-12-24 DIAGNOSIS — C3401 Malignant neoplasm of right main bronchus: Secondary | ICD-10-CM

## 2022-12-24 DIAGNOSIS — I5042 Chronic combined systolic (congestive) and diastolic (congestive) heart failure: Secondary | ICD-10-CM | POA: Diagnosis not present

## 2022-12-24 DIAGNOSIS — E876 Hypokalemia: Secondary | ICD-10-CM

## 2022-12-24 DIAGNOSIS — I255 Ischemic cardiomyopathy: Secondary | ICD-10-CM | POA: Diagnosis present

## 2022-12-24 DIAGNOSIS — R899 Unspecified abnormal finding in specimens from other organs, systems and tissues: Secondary | ICD-10-CM

## 2022-12-24 DIAGNOSIS — I272 Pulmonary hypertension, unspecified: Secondary | ICD-10-CM

## 2022-12-24 DIAGNOSIS — E039 Hypothyroidism, unspecified: Secondary | ICD-10-CM | POA: Diagnosis present

## 2022-12-24 DIAGNOSIS — I480 Paroxysmal atrial fibrillation: Secondary | ICD-10-CM | POA: Diagnosis present

## 2022-12-24 DIAGNOSIS — I25708 Atherosclerosis of coronary artery bypass graft(s), unspecified, with other forms of angina pectoris: Secondary | ICD-10-CM | POA: Diagnosis present

## 2022-12-24 LAB — BASIC METABOLIC PANEL
Anion gap: 8 (ref 5–15)
BUN: 38 mg/dL — ABNORMAL HIGH (ref 8–23)
CO2: 34 mmol/L — ABNORMAL HIGH (ref 22–32)
Calcium: 9.6 mg/dL (ref 8.9–10.3)
Chloride: 96 mmol/L — ABNORMAL LOW (ref 98–111)
Creatinine, Ser: 1.11 mg/dL — ABNORMAL HIGH (ref 0.44–1.00)
GFR, Estimated: 49 mL/min — ABNORMAL LOW (ref >60)
Glucose, Bld: 99 mg/dL (ref 70–99)
Potassium: 4.7 mmol/L (ref 3.5–5.1)
Sodium: 138 mmol/L (ref 135–145)

## 2022-12-24 LAB — CBC WITH DIFFERENTIAL/PLATELET
Abs Immature Granulocytes: 0.01 10*3/uL (ref 0.00–0.07)
Basophils Absolute: 0 10*3/uL (ref 0.0–0.1)
Basophils Relative: 0 %
Eosinophils Absolute: 0 10*3/uL (ref 0.0–0.5)
Eosinophils Relative: 0 %
HCT: 39.9 % (ref 36.0–46.0)
Hemoglobin: 12.3 g/dL (ref 12.0–15.0)
Immature Granulocytes: 0 %
Lymphocytes Relative: 16 %
Lymphs Abs: 0.8 10*3/uL (ref 0.7–4.0)
MCH: 29.6 pg (ref 26.0–34.0)
MCHC: 30.8 g/dL (ref 30.0–36.0)
MCV: 95.9 fL (ref 80.0–100.0)
Monocytes Absolute: 0.5 10*3/uL (ref 0.1–1.0)
Monocytes Relative: 10 %
Neutro Abs: 3.6 10*3/uL (ref 1.7–7.7)
Neutrophils Relative %: 74 %
Platelets: 187 10*3/uL (ref 150–400)
RBC: 4.16 MIL/uL (ref 3.87–5.11)
RDW: 15.2 % (ref 11.5–15.5)
WBC: 4.9 10*3/uL (ref 4.0–10.5)
nRBC: 0 % (ref 0.0–0.2)

## 2022-12-24 LAB — FERRITIN: Ferritin: 82 ng/mL (ref 11–307)

## 2022-12-24 LAB — IRON AND TIBC
Iron: 82 ug/dL (ref 28–170)
Saturation Ratios: 23 % (ref 10.4–31.8)
TIBC: 364 ug/dL (ref 250–450)
UIBC: 282 ug/dL

## 2022-12-24 LAB — HEMOGLOBIN A1C
Hgb A1c MFr Bld: 5.3 % (ref 4.8–5.6)
Mean Plasma Glucose: 105.41 mg/dL

## 2022-12-24 NOTE — Progress Notes (Signed)
Blood works shows a little dehydration. Would recommend increasing fluids during the day and just take furosemide once daily versus twice daily.

## 2022-12-24 NOTE — Patient Instructions (Signed)
Medication Instructions:  Your physician recommends that you continue on your current medications as directed. Please refer to the Current Medication list given to you today.  *If you need a refill on your cardiac medications before your next appointment, please call your pharmacy*   Lab Work: Your physician recommends that you get lab work today: BMET & HgB A1c  Medical Mall Entrance at [facility] 1st desk on the right to check in (REGISTRATION)  Lab hours: Monday- Friday (7:30 am- 5:30 pm)   If you have labs (blood work) drawn today and your tests are completely normal, you will receive your results only by: MyChart Message (if you have MyChart) OR A paper copy in the mail If you have any lab test that is abnormal or we need to change your treatment, we will call you to review the results.   Testing/Procedures: -None ordered   Follow-Up: At [facility], you and your health needs are our priority.  As part of our continuing mission to provide you with exceptional heart care, we have created designated Provider Care Teams.  These Care Teams include your primary Cardiologist (physician) and Advanced Practice Providers (APPs -  Physician Assistants and Nurse Practitioners) who all work together to provide you with the care you need, when you need it.  We recommend signing up for the patient portal called "MyChart".  Sign up information is provided on this After Visit Summary.  MyChart is used to connect with patients for Virtual Visits (Telemedicine).  Patients are able to view lab/test results, encounter notes, upcoming appointments, etc.  Non-urgent messages can be sent to your provider as well.   To learn more about what you can do with MyChart, go to [email or web address].    Your next appointment:   6 month(s)  Provider:   You may see Julien Nordmann, MD or one of the following Advanced Practice Providers on your designated Care Team:   Nicolasa Ducking, NP Eula Listen,  PA-C Cadence Fransico Michael, PA-C Charlsie Quest, NP    Other Instructions -None

## 2022-12-24 NOTE — Progress Notes (Signed)
Cardiology Office Note:   Date:  12/24/2022  ID:  Jenny Cooper, DOB 1936/08/12, MRN [id number]  History of Present Illness:   Jenny Cooper is a 87 y.o. female with past medical history of COPD on home oxygen 2 L, former tobacco use, stage III squamous cell lung cancer status post chemoradiation therapy in 2016, chronic diastolic heart failure, hypertension, paroxysmal atrial fibrillation/flutter on chronic anticoagulation, coronary artery disease status post CABG, permanent pacemaker placement, who is here today for follow-up.  In 03/2015 she had an outpatient cardiac catheterization that revealed three-vessel coronary artery disease with patent SVG to LAD, occluded SVG to OM, native RCA with severe in-stent restenosis to the mid segment and was occluded distally at the sites of previously placed stents.  There were left-to-right collaterals.  Moderately reduced LV SF with an EF of 35-40%.  Mildly elevated LVEDP.  There were no good targets for revascularization and she was recommended for medical therapy.  Patient was followed by [facility] and underwent cardioversion in December 18, 2019, recurrent symptoms Jan 13, 2020 antibiotic.  Jan 22, 2020 at that time pacemaker changes were made.  Patient underwent right shoulder replacement prior to surgery she was told her potassium was low and she was given tablets to take.  She was seen in the cardiology office 10/22 for weakness and fatigue.  Labs were checked which showed potassium of 2.6 and AKI which she was sent to the emergency department.  She was admitted to the hospital 10/25 - 10/21 and treated for congestive heart failure exacerbation and was diuresed.  She was also started on midodrine for hypotension.  Toprol was decreased for hypotension and Entresto was held.  Echocardiogram revealed LVEF of 40-45%, global hypokinesis, and moderately elevated pulmonary artery pressures.   She was last seen in clinic 05/28/2022 by Dr. Mariah Milling.  At the time of her  appointment she had complaints of shortness of breath with little to no exertion.  No changes made to her medications so she was sent for repeat BMP.  There was no further testing that was ordered either.  She returns to clinic today accompanied by her grandson. She is ina wheelchair today with her left arm in a sling. She sustained a mechanical fall and suffered a broken clavicle. She continues to have chronic O2 via [place]. Overall she states that she has been doing fairly well. She continues to have her chronic shortness of breath. Denies any chest pain, peripheral edema, bleeding issues without any blood noted on urine or stool, or syncope. She denies any recent hospitalizations or visits to the emergency department.   ROS: 10 point review of systems has been reviewed and is considered negative with the exception of what has been mentioned in the HPI  Studies Reviewed:    EKG:  sinus rhythm rate of 92, first degree AV block, LPFB  TTE 06/24/21 1. Left ventricular ejection fraction, by estimation, is 40 to 45%. The  left ventricle has mild to moderately decreased function. The left  ventricle demonstrates global hypokinesis. Left ventricular diastolic  parameters are indeterminate.   2. Right ventricular systolic function is low normal. The right  ventricular size is normal. There is moderately elevated pulmonary artery  systolic pressure.   3. The mitral valve is degenerative. Trivial mitral valve regurgitation.  Moderate mitral annular calcification.   4. The aortic valve was not well visualized. Aortic valve regurgitation  is not visualized.   5. The inferior vena cava is dilated in size  with <50% respiratory  variability, suggesting right atrial pressure of 15 mmHg.   Risk Assessment/Calculations:    CHA2DS2-VASc Score = 5   This indicates a 7.2% annual risk of stroke. The patient's score is based upon: CHF History: 1 HTN History: 1 Diabetes History: 0 Stroke History: 0 Vascular  Disease History: 0 Age Score: 2 Gender Score: 1             Physical Exam:   VS:  BP 102/63 (BP Location: Right Arm, Patient Position: Sitting, Cuff Size: Normal)   Pulse 92   Ht 5\' 6"  (1.676 m)   Wt 134 lb 9.6 oz (61.1 kg)   SpO2 (!) 87%   BMI 21.73 kg/m    Wt Readings from Last 3 Encounters:  12/24/22 134 lb 9.6 oz (61.1 kg)  12/11/22 130 lb (59 kg)  07/13/22 137 lb 4 oz (62.3 kg)     GEN: Well nourished, well developed in no acute distress NECK: No JVD; No carotid bruits CARDIAC: RRR, no murmurs, rubs, gallops RESPIRATORY:  Diminished to auscultation without rales, wheezing or rhonchi currently on O2 via  ABDOMEN: Soft, non-tender, non-distended EXTREMITIES:  No edema; Left arm sling  ASSESSMENT AND PLAN:   Systolic and diastolic congestive heart failure with chronic shortness of breath that is unchanged today.  The EF on echocardiogram done 05/2021 was 40-45%.  She continues on oxygen via nasal cannula.  Denies any worsening shortness of breath even though her oxygen saturations were slightly lower on arrival today.  According to the patient and her grandson with her cold fingers that she has been running slightly lower.  She appears to be euvolemic on exam.  She is continued on furosemide 40 mg once daily and Toprol-XL 100 mg twice daily.  She had a previous history of hypokalemia that was resolved but she has not had recent blood work she has been sent for BMP today to reevaluate electrolyte and kidney function since being on diuretic therapy.  Paroxysmal atrial fibrillation/atrial flutter.  She maintains sinus rhythm on EKG today.  She is continued on apixaban 5 mg twice daily for CHA2DS2-VASc score of at least 5 without any issues of bleeding.  She has not noticed any blood in her urine or stool.  She is continued on Toprol-XL 100 mg twice daily.  Pulmonary hypertension with moderately elevated heart pressures on echo in 2021, echo October 2022 moderately elevated right  heart pressures and dilated IVC.  Echocardiogram in August with an outside facility with severely elevated right heart pressures.  She continues on furosemide 40 mg daily.  Being sent for repeat BMP today.  She has been continued on potassium supplements.  Hyperlipidemia which she is continued on rosuvastatin 5 mg daily.  LDL was previously at goal.  She has follow-up coming up with her PCP to reevaluate her blood work.  Coronary artery disease status post CABG x 2 SVG to LAD and SVG to OM, with ischemic cardiomyopathy.  She denies any chest pains or chest pain equivalents today.  She has been continued on apixaban and lieu of aspirin and statin therapy.  Hypothyroidism where she is continued on Synthroid this continues to be managed by her PCP.  COPD on chronic oxygen therapy.  She is continued on Pulmicort and DuoNebs and Yupelri.  She is continued on chronic oxygen.  She continues to be managed by pulmonary.  Disposition patient return to clinic to see MD/APP in 6 months or sooner if needed.  Signed, Sandrika Schwinn, NP

## 2022-12-26 NOTE — Progress Notes (Signed)
A1c 5.3. No diabetes noted.

## 2022-12-27 ENCOUNTER — Other Ambulatory Visit: Payer: Self-pay | Admitting: *Deleted

## 2022-12-27 MED ORDER — FUROSEMIDE 20 MG PO TABS: 40.0000 mg | ORAL_TABLET | Freq: Every day | ORAL | 3 refills | Status: DC

## 2023-01-04 ENCOUNTER — Other Ambulatory Visit: Payer: Self-pay | Admitting: Cardiovascular Disease

## 2023-01-04 ENCOUNTER — Other Ambulatory Visit: Payer: Self-pay | Admitting: Internal Medicine

## 2023-01-06 ENCOUNTER — Inpatient Hospital Stay
Admission: EM | Admit: 2023-01-06 | Discharge: 2023-01-10 | DRG: 871 | Disposition: A | Discharge status: Hospice | Payer: Medicare Other | Attending: Internal Medicine | Admitting: Internal Medicine

## 2023-01-06 ENCOUNTER — Emergency Department: Payer: Medicare Other

## 2023-01-06 DIAGNOSIS — E875 Hyperkalemia: Secondary | ICD-10-CM | POA: Diagnosis present

## 2023-01-06 DIAGNOSIS — Z66 Do not resuscitate: Secondary | ICD-10-CM | POA: Diagnosis not present

## 2023-01-06 DIAGNOSIS — Z7189 Other specified counseling: Secondary | ICD-10-CM

## 2023-01-06 DIAGNOSIS — Z86718 Personal history of other venous thrombosis and embolism: Secondary | ICD-10-CM

## 2023-01-06 DIAGNOSIS — Z8249 Family history of ischemic heart disease and other diseases of the circulatory system: Secondary | ICD-10-CM

## 2023-01-06 DIAGNOSIS — Z6822 Body mass index (BMI) 22.0-22.9, adult: Secondary | ICD-10-CM

## 2023-01-06 DIAGNOSIS — I4821 Permanent atrial fibrillation: Secondary | ICD-10-CM | POA: Diagnosis present

## 2023-01-06 DIAGNOSIS — Z955 Presence of coronary angioplasty implant and graft: Secondary | ICD-10-CM

## 2023-01-06 DIAGNOSIS — Z79899 Other long term (current) drug therapy: Secondary | ICD-10-CM

## 2023-01-06 DIAGNOSIS — I484 Atypical atrial flutter: Secondary | ICD-10-CM | POA: Diagnosis present

## 2023-01-06 DIAGNOSIS — Z800 Family history of malignant neoplasm of digestive organs: Secondary | ICD-10-CM

## 2023-01-06 DIAGNOSIS — R6521 Severe sepsis with septic shock: Secondary | ICD-10-CM | POA: Diagnosis present

## 2023-01-06 DIAGNOSIS — I5043 Acute on chronic combined systolic (congestive) and diastolic (congestive) heart failure: Secondary | ICD-10-CM | POA: Diagnosis present

## 2023-01-06 DIAGNOSIS — E872 Acidosis, unspecified: Secondary | ICD-10-CM | POA: Diagnosis present

## 2023-01-06 DIAGNOSIS — Z96611 Presence of right artificial shoulder joint: Secondary | ICD-10-CM | POA: Diagnosis present

## 2023-01-06 DIAGNOSIS — Z950 Presence of cardiac pacemaker: Secondary | ICD-10-CM

## 2023-01-06 DIAGNOSIS — I272 Pulmonary hypertension, unspecified: Secondary | ICD-10-CM | POA: Diagnosis present

## 2023-01-06 DIAGNOSIS — I5023 Acute on chronic systolic (congestive) heart failure: Secondary | ICD-10-CM | POA: Diagnosis present

## 2023-01-06 DIAGNOSIS — R55 Syncope and collapse: Secondary | ICD-10-CM | POA: Diagnosis present

## 2023-01-06 DIAGNOSIS — Z881 Allergy status to other antibiotic agents status: Secondary | ICD-10-CM

## 2023-01-06 DIAGNOSIS — I252 Old myocardial infarction: Secondary | ICD-10-CM

## 2023-01-06 DIAGNOSIS — I2510 Atherosclerotic heart disease of native coronary artery without angina pectoris: Secondary | ICD-10-CM | POA: Diagnosis present

## 2023-01-06 DIAGNOSIS — K219 Gastro-esophageal reflux disease without esophagitis: Secondary | ICD-10-CM | POA: Diagnosis present

## 2023-01-06 DIAGNOSIS — I130 Hypertensive heart and chronic kidney disease with heart failure and stage 1 through stage 4 chronic kidney disease, or unspecified chronic kidney disease: Secondary | ICD-10-CM | POA: Diagnosis present

## 2023-01-06 DIAGNOSIS — R627 Adult failure to thrive: Secondary | ICD-10-CM | POA: Diagnosis present

## 2023-01-06 DIAGNOSIS — E1122 Type 2 diabetes mellitus with diabetic chronic kidney disease: Secondary | ICD-10-CM | POA: Diagnosis present

## 2023-01-06 DIAGNOSIS — C3490 Malignant neoplasm of unspecified part of unspecified bronchus or lung: Secondary | ICD-10-CM | POA: Diagnosis present

## 2023-01-06 DIAGNOSIS — Z1152 Encounter for screening for COVID-19: Secondary | ICD-10-CM

## 2023-01-06 DIAGNOSIS — I447 Left bundle-branch block, unspecified: Secondary | ICD-10-CM | POA: Diagnosis present

## 2023-01-06 DIAGNOSIS — Z87891 Personal history of nicotine dependence: Secondary | ICD-10-CM

## 2023-01-06 DIAGNOSIS — Z85118 Personal history of other malignant neoplasm of bronchus and lung: Secondary | ICD-10-CM

## 2023-01-06 DIAGNOSIS — Z952 Presence of prosthetic heart valve: Secondary | ICD-10-CM

## 2023-01-06 DIAGNOSIS — J449 Chronic obstructive pulmonary disease, unspecified: Secondary | ICD-10-CM | POA: Diagnosis present

## 2023-01-06 DIAGNOSIS — Z9181 History of falling: Secondary | ICD-10-CM

## 2023-01-06 DIAGNOSIS — J189 Pneumonia, unspecified organism: Secondary | ICD-10-CM | POA: Diagnosis present

## 2023-01-06 DIAGNOSIS — M810 Age-related osteoporosis without current pathological fracture: Secondary | ICD-10-CM | POA: Diagnosis present

## 2023-01-06 DIAGNOSIS — K7460 Unspecified cirrhosis of liver: Secondary | ICD-10-CM | POA: Diagnosis present

## 2023-01-06 DIAGNOSIS — Z823 Family history of stroke: Secondary | ICD-10-CM

## 2023-01-06 DIAGNOSIS — E039 Hypothyroidism, unspecified: Secondary | ICD-10-CM | POA: Diagnosis present

## 2023-01-06 DIAGNOSIS — I255 Ischemic cardiomyopathy: Secondary | ICD-10-CM | POA: Diagnosis present

## 2023-01-06 DIAGNOSIS — I5022 Chronic systolic (congestive) heart failure: Secondary | ICD-10-CM | POA: Diagnosis present

## 2023-01-06 DIAGNOSIS — I2489 Other forms of acute ischemic heart disease: Secondary | ICD-10-CM | POA: Diagnosis present

## 2023-01-06 DIAGNOSIS — Z825 Family history of asthma and other chronic lower respiratory diseases: Secondary | ICD-10-CM

## 2023-01-06 DIAGNOSIS — R7401 Elevation of levels of liver transaminase levels: Secondary | ICD-10-CM | POA: Diagnosis present

## 2023-01-06 DIAGNOSIS — Z9221 Personal history of antineoplastic chemotherapy: Secondary | ICD-10-CM

## 2023-01-06 DIAGNOSIS — Z515 Encounter for palliative care: Secondary | ICD-10-CM

## 2023-01-06 DIAGNOSIS — E860 Dehydration: Secondary | ICD-10-CM | POA: Diagnosis present

## 2023-01-06 DIAGNOSIS — Z888 Allergy status to other drugs, medicaments and biological substances status: Secondary | ICD-10-CM

## 2023-01-06 DIAGNOSIS — J441 Chronic obstructive pulmonary disease with (acute) exacerbation: Secondary | ICD-10-CM | POA: Diagnosis present

## 2023-01-06 DIAGNOSIS — J9621 Acute and chronic respiratory failure with hypoxia: Principal | ICD-10-CM | POA: Diagnosis present

## 2023-01-06 DIAGNOSIS — E785 Hyperlipidemia, unspecified: Secondary | ICD-10-CM | POA: Diagnosis present

## 2023-01-06 DIAGNOSIS — J432 Centrilobular emphysema: Secondary | ICD-10-CM | POA: Diagnosis present

## 2023-01-06 DIAGNOSIS — A419 Sepsis, unspecified organism: Principal | ICD-10-CM | POA: Diagnosis present

## 2023-01-06 DIAGNOSIS — I495 Sick sinus syndrome: Secondary | ICD-10-CM | POA: Diagnosis present

## 2023-01-06 DIAGNOSIS — R579 Shock, unspecified: Secondary | ICD-10-CM | POA: Diagnosis present

## 2023-01-06 DIAGNOSIS — N1832 Chronic kidney disease, stage 3b: Secondary | ICD-10-CM | POA: Diagnosis present

## 2023-01-06 DIAGNOSIS — Z923 Personal history of irradiation: Secondary | ICD-10-CM

## 2023-01-06 DIAGNOSIS — I471 Supraventricular tachycardia, unspecified: Secondary | ICD-10-CM | POA: Diagnosis present

## 2023-01-06 DIAGNOSIS — I700 Atherosclerosis of aorta: Secondary | ICD-10-CM | POA: Diagnosis present

## 2023-01-06 DIAGNOSIS — N170 Acute kidney failure with tubular necrosis: Secondary | ICD-10-CM | POA: Diagnosis present

## 2023-01-06 DIAGNOSIS — Z7901 Long term (current) use of anticoagulants: Secondary | ICD-10-CM

## 2023-01-06 DIAGNOSIS — I25708 Atherosclerosis of coronary artery bypass graft(s), unspecified, with other forms of angina pectoris: Secondary | ICD-10-CM | POA: Diagnosis present

## 2023-01-06 DIAGNOSIS — Z9981 Dependence on supplemental oxygen: Secondary | ICD-10-CM

## 2023-01-06 LAB — COMPREHENSIVE METABOLIC PANEL
ALT: 22 U/L (ref 0–44)
AST: 44 U/L — ABNORMAL HIGH (ref 15–41)
Albumin: 4.1 g/dL (ref 3.5–5.0)
Alkaline Phosphatase: 156 U/L — ABNORMAL HIGH (ref 38–126)
Anion gap: 16 — ABNORMAL HIGH (ref 5–15)
BUN: 36 mg/dL — ABNORMAL HIGH (ref 8–23)
CO2: 23 mmol/L (ref 22–32)
Calcium: 9.5 mg/dL (ref 8.9–10.3)
Chloride: 96 mmol/L — ABNORMAL LOW (ref 98–111)
Creatinine, Ser: 1.57 mg/dL — ABNORMAL HIGH (ref 0.44–1.00)
GFR, Estimated: 32 mL/min — ABNORMAL LOW (ref >60)
Glucose, Bld: 234 mg/dL — ABNORMAL HIGH (ref 70–99)
Potassium: 5.9 mmol/L — ABNORMAL HIGH (ref 3.5–5.1)
Sodium: 135 mmol/L (ref 135–145)
Total Bilirubin: 1.3 mg/dL — ABNORMAL HIGH (ref 0.3–1.2)
Total Protein: 8.7 g/dL — ABNORMAL HIGH (ref 6.5–8.1)

## 2023-01-06 LAB — CBC WITH DIFFERENTIAL/PLATELET
Abs Immature Granulocytes: 0.04 10*3/uL (ref 0.00–0.07)
Basophils Absolute: 0 10*3/uL (ref 0.0–0.1)
Basophils Relative: 1 %
Eosinophils Absolute: 0 10*3/uL (ref 0.0–0.5)
Eosinophils Relative: 0 %
HCT: 41.6 % (ref 36.0–46.0)
Hemoglobin: 12.3 g/dL (ref 12.0–15.0)
Immature Granulocytes: 1 %
Lymphocytes Relative: 25 %
Lymphs Abs: 1.3 10*3/uL (ref 0.7–4.0)
MCH: 29.9 pg (ref 26.0–34.0)
MCHC: 29.6 g/dL — ABNORMAL LOW (ref 30.0–36.0)
MCV: 101 fL — ABNORMAL HIGH (ref 80.0–100.0)
Monocytes Absolute: 0.5 10*3/uL (ref 0.1–1.0)
Monocytes Relative: 10 %
Neutro Abs: 3.5 10*3/uL (ref 1.7–7.7)
Neutrophils Relative %: 63 %
Platelets: 198 10*3/uL (ref 150–400)
RBC: 4.12 MIL/uL (ref 3.87–5.11)
RDW: 15.9 % — ABNORMAL HIGH (ref 11.5–15.5)
Smear Review: NORMAL
WBC: 5.4 10*3/uL (ref 4.0–10.5)
nRBC: 0 % (ref 0.0–0.2)

## 2023-01-06 LAB — RESP PANEL BY RT-PCR (RSV, FLU A&B, COVID)  RVPGX2
Influenza A by PCR: NEGATIVE
Influenza B by PCR: NEGATIVE
Resp Syncytial Virus by PCR: NEGATIVE
SARS Coronavirus 2 by RT PCR: NEGATIVE

## 2023-01-06 LAB — TROPONIN I (HIGH SENSITIVITY): Troponin I (High Sensitivity): 22 ng/L — ABNORMAL HIGH (ref <18)

## 2023-01-06 MED ORDER — IOHEXOL 350 MG/ML SOLN
60.0000 mL | Freq: Once | INTRAVENOUS | Status: AC | PRN
  Administered 2023-01-07: 60 mL via INTRAVENOUS

## 2023-01-06 MED ORDER — SODIUM CHLORIDE 0.9 % IV SOLN
3.0000 g | INTRAVENOUS | Status: AC
  Administered 2023-01-06: 3 g via INTRAVENOUS
  Filled 2023-01-06: qty 8

## 2023-01-06 MED ORDER — ALBUTEROL SULFATE (2.5 MG/3ML) 0.083% IN NEBU
5.0000 mg | INHALATION_SOLUTION | Freq: Once | RESPIRATORY_TRACT | Status: AC
  Administered 2023-01-06: 5 mg via RESPIRATORY_TRACT
  Filled 2023-01-06: qty 6

## 2023-01-06 MED ORDER — ALBUTEROL SULFATE (2.5 MG/3ML) 0.083% IN NEBU
INHALATION_SOLUTION | RESPIRATORY_TRACT | Status: AC
  Filled 2023-01-06: qty 3

## 2023-01-06 MED ORDER — SODIUM CHLORIDE 0.9 % IV SOLN
500.0000 mg | Freq: Once | INTRAVENOUS | Status: AC
  Administered 2023-01-07: 500 mg via INTRAVENOUS
  Filled 2023-01-06: qty 5

## 2023-01-06 MED ORDER — IPRATROPIUM-ALBUTEROL 0.5-2.5 (3) MG/3ML IN SOLN
RESPIRATORY_TRACT | Status: AC
  Filled 2023-01-06: qty 3

## 2023-01-06 MED ORDER — IPRATROPIUM-ALBUTEROL 0.5-2.5 (3) MG/3ML IN SOLN
3.0000 mL | Freq: Once | RESPIRATORY_TRACT | Status: AC
  Administered 2023-01-06: 3 mL via RESPIRATORY_TRACT
  Filled 2023-01-06: qty 3

## 2023-01-06 MED ORDER — METHYLPREDNISOLONE SODIUM SUCC 125 MG IJ SOLR
125.0000 mg | INTRAMUSCULAR | Status: AC
  Administered 2023-01-06: 125 mg via INTRAVENOUS
  Filled 2023-01-06: qty 2

## 2023-01-06 NOTE — ED Triage Notes (Signed)
Patient C/O cough and SOB that began today. Patient does have history of lung cancer and COPD. Patient states she uses 4L nasal cannula at baseline.

## 2023-01-06 NOTE — ED Provider Notes (Signed)
[facility] Provider Note    Event Date/Time   First MD Initiated Contact with Patient 01/06/23 2210     (approximate)   History   Chief Complaint: Shortness of Breath   HPI  Jenny Cooper is a 87 y.o. female with a history of hypertension CAD paroxysmal atrial fibrillation GERD COPD DVT lung cancer who comes ED complaining of cough and shortness of breath starting today.  Uses 4 L nasal cannula at baseline but was hypoxic on this with visible facial cyanosis.  When EMS arrived she was on a oxygen mask provided by family.  Family did report that another household member was sick recently with upper respiratory infection symptoms.  Patient denies chest pain.     Physical Exam   Triage Vital Signs: ED Triage Vitals [01/06/23 2210]  Enc Vitals Group     BP      Pulse      Resp      Temp      Temp src      SpO2      Weight 141 lb 12.1 oz (64.3 kg)     Height      Head Circumference      Peak Flow      Pain Score      Pain Loc      Pain Edu?      Excl. in GC?     Most recent vital signs: Vitals:   01/06/23 2237 01/06/23 2239  BP:    Pulse: 60   Resp: (!) 30   Temp:  97.7 F (36.5 C)  SpO2: 98%     General: Awake, no distress.  CV:  Good peripheral perfusion.  Regular rate and rhythm Resp:  Normal effort.  Tachypnea, respiratory rate of 25.  Symmetric air movement.  No wheezing or stridor.  Bilateral basilar crackles. Abd:  No distention.  Soft nontender Other:  No lower extremity edema or calf tenderness.  Normal mental status.   ED Results / Procedures / Treatments   Labs (all labs ordered are listed, but only abnormal results are displayed) Labs Reviewed  CBC WITH DIFFERENTIAL/PLATELET - Abnormal; Notable for the following components:      Result Value   MCV 101.0 (*)    MCHC 29.6 (*)    RDW 15.9 (*)    All other components within normal limits  RESP PANEL BY RT-PCR (RSV, FLU A&B, COVID)  RVPGX2  COMPREHENSIVE METABOLIC  PANEL  BRAIN NATRIURETIC PEPTIDE  TROPONIN I (HIGH SENSITIVITY)     EKG Interpreted by me Paced rhythm, rate of 60.  Left axis, left bundle branch block.  No acute ischemic changes.   RADIOLOGY Chest x-ray interpreted by me, shows scattered bilateral basilar hazy opacities concerning for pneumonia.  Radiology report reviewed.   PROCEDURES:  Procedures   MEDICATIONS ORDERED IN ED: Medications  Ampicillin-Sulbactam (UNASYN) 3 g in sodium chloride 0.9 % 100 mL IVPB (3 g Intravenous New Bag/Given 01/06/23 2311)  azithromycin (ZITHROMAX) 500 mg in sodium chloride 0.9 % 250 mL IVPB (has no administration in time range)  methylPREDNISolone sodium succinate (SOLU-MEDROL) 125 mg/2 mL injection 125 mg (125 mg Intravenous Given 01/06/23 2311)  ipratropium-albuterol (DUONEB) 0.5-2.5 (3) MG/3ML nebulizer solution 3 mL (3 mLs Nebulization Given 01/06/23 2310)  albuterol (PROVENTIL) (2.5 MG/3ML) 0.083% nebulizer solution 5 mg (5 mg Nebulization Given 01/06/23 2310)     IMPRESSION / MDM / ASSESSMENT AND PLAN / ED COURSE  I reviewed the triage  vital signs and the nursing notes.  DDx: Pneumonia, PE, pleural effusion, pulmonary edema, anemia, electrolyte abnormality, AKI, COPD exacerbation  Patient's presentation is most consistent with acute presentation with potential threat to life or bodily function.  Patient presents with shortness of breath.  Baseline O2 requirement is 4 L nasal cannula, in the ED she is requiring 8 L to maintain adequate oxygenation.  Has basilar crackles.  She has tachypnea as well.  She is moving good air volumes, not requiring BiPAP currently.  Will cover with Solu-Medrol, bronchodilators, Unasyn (due to cephalosporin allergy) and azithromycin.  Will obtain CT angiogram to rule out PE given her lung cancer history.  Will need to admit.   Clinical Course as of 01/06/23 2313  Thu Jan 06, 2023  2303 History of lung CA in remission, COPD on home 4L now hypoxic requiring 8L.   Clinically with CAP and ? PNA on CXR.  Labs and CTA pending. Admit  [SM]    Clinical Course User Index [SM] Corena Herter, MD     FINAL CLINICAL IMPRESSION(S) / ED DIAGNOSES   Final diagnoses:  Acute on chronic respiratory failure with hypoxia (HCC)     Rx / DC Orders   ED Discharge Orders     None        Note:  This document was prepared using Dragon voice recognition software and may include unintentional dictation errors.   Sharman Cheek, MD 01/06/23 [phone]

## 2023-01-07 ENCOUNTER — Inpatient Hospital Stay: Payer: Medicare Other

## 2023-01-07 ENCOUNTER — Inpatient Hospital Stay (HOSPITAL_COMMUNITY)
Admit: 2023-01-07 | Discharge: 2023-01-07 | Disposition: A | Discharge status: Home / Self Care | Payer: Medicare Other | Attending: Pulmonary Disease | Admitting: Pulmonary Disease

## 2023-01-07 ENCOUNTER — Encounter: Payer: Self-pay | Admitting: Student in an Organized Health Care Education/Training Program

## 2023-01-07 ENCOUNTER — Emergency Department: Payer: Medicare Other

## 2023-01-07 DIAGNOSIS — R579 Shock, unspecified: Secondary | ICD-10-CM | POA: Insufficient documentation

## 2023-01-07 DIAGNOSIS — Z7189 Other specified counseling: Secondary | ICD-10-CM | POA: Diagnosis not present

## 2023-01-07 DIAGNOSIS — R55 Syncope and collapse: Secondary | ICD-10-CM

## 2023-01-07 DIAGNOSIS — E872 Acidosis, unspecified: Secondary | ICD-10-CM | POA: Diagnosis present

## 2023-01-07 DIAGNOSIS — J441 Chronic obstructive pulmonary disease with (acute) exacerbation: Secondary | ICD-10-CM | POA: Diagnosis present

## 2023-01-07 DIAGNOSIS — R6521 Severe sepsis with septic shock: Secondary | ICD-10-CM | POA: Diagnosis present

## 2023-01-07 DIAGNOSIS — N1832 Chronic kidney disease, stage 3b: Secondary | ICD-10-CM

## 2023-01-07 DIAGNOSIS — K7460 Unspecified cirrhosis of liver: Secondary | ICD-10-CM | POA: Insufficient documentation

## 2023-01-07 DIAGNOSIS — I5023 Acute on chronic systolic (congestive) heart failure: Secondary | ICD-10-CM

## 2023-01-07 DIAGNOSIS — N170 Acute kidney failure with tubular necrosis: Secondary | ICD-10-CM | POA: Diagnosis present

## 2023-01-07 DIAGNOSIS — I428 Other cardiomyopathies: Secondary | ICD-10-CM | POA: Diagnosis not present

## 2023-01-07 DIAGNOSIS — A419 Sepsis, unspecified organism: Secondary | ICD-10-CM | POA: Diagnosis present

## 2023-01-07 DIAGNOSIS — I25708 Atherosclerosis of coronary artery bypass graft(s), unspecified, with other forms of angina pectoris: Secondary | ICD-10-CM

## 2023-01-07 DIAGNOSIS — I495 Sick sinus syndrome: Secondary | ICD-10-CM | POA: Diagnosis present

## 2023-01-07 DIAGNOSIS — J9621 Acute and chronic respiratory failure with hypoxia: Secondary | ICD-10-CM

## 2023-01-07 DIAGNOSIS — I484 Atypical atrial flutter: Secondary | ICD-10-CM | POA: Diagnosis present

## 2023-01-07 DIAGNOSIS — I2489 Other forms of acute ischemic heart disease: Secondary | ICD-10-CM | POA: Diagnosis present

## 2023-01-07 DIAGNOSIS — R0602 Shortness of breath: Secondary | ICD-10-CM

## 2023-01-07 DIAGNOSIS — J449 Chronic obstructive pulmonary disease, unspecified: Secondary | ICD-10-CM | POA: Diagnosis not present

## 2023-01-07 DIAGNOSIS — Z515 Encounter for palliative care: Secondary | ICD-10-CM | POA: Diagnosis not present

## 2023-01-07 DIAGNOSIS — I5022 Chronic systolic (congestive) heart failure: Secondary | ICD-10-CM | POA: Diagnosis not present

## 2023-01-07 DIAGNOSIS — I4821 Permanent atrial fibrillation: Secondary | ICD-10-CM

## 2023-01-07 DIAGNOSIS — I5043 Acute on chronic combined systolic (congestive) and diastolic (congestive) heart failure: Secondary | ICD-10-CM | POA: Diagnosis present

## 2023-01-07 DIAGNOSIS — Z952 Presence of prosthetic heart valve: Secondary | ICD-10-CM | POA: Diagnosis not present

## 2023-01-07 DIAGNOSIS — E039 Hypothyroidism, unspecified: Secondary | ICD-10-CM | POA: Diagnosis present

## 2023-01-07 DIAGNOSIS — I130 Hypertensive heart and chronic kidney disease with heart failure and stage 1 through stage 4 chronic kidney disease, or unspecified chronic kidney disease: Secondary | ICD-10-CM | POA: Diagnosis present

## 2023-01-07 DIAGNOSIS — Z1152 Encounter for screening for COVID-19: Secondary | ICD-10-CM | POA: Diagnosis not present

## 2023-01-07 DIAGNOSIS — E1122 Type 2 diabetes mellitus with diabetic chronic kidney disease: Secondary | ICD-10-CM | POA: Diagnosis present

## 2023-01-07 DIAGNOSIS — C3490 Malignant neoplasm of unspecified part of unspecified bronchus or lung: Secondary | ICD-10-CM | POA: Diagnosis not present

## 2023-01-07 DIAGNOSIS — I471 Supraventricular tachycardia, unspecified: Secondary | ICD-10-CM | POA: Diagnosis present

## 2023-01-07 DIAGNOSIS — Z66 Do not resuscitate: Secondary | ICD-10-CM | POA: Diagnosis not present

## 2023-01-07 DIAGNOSIS — I700 Atherosclerosis of aorta: Secondary | ICD-10-CM | POA: Diagnosis present

## 2023-01-07 DIAGNOSIS — I4891 Unspecified atrial fibrillation: Secondary | ICD-10-CM | POA: Diagnosis not present

## 2023-01-07 DIAGNOSIS — J189 Pneumonia, unspecified organism: Secondary | ICD-10-CM | POA: Diagnosis present

## 2023-01-07 DIAGNOSIS — I272 Pulmonary hypertension, unspecified: Secondary | ICD-10-CM | POA: Diagnosis present

## 2023-01-07 LAB — BASIC METABOLIC PANEL
Anion gap: 11 (ref 5–15)
Anion gap: 16 — ABNORMAL HIGH (ref 5–15)
BUN: 34 mg/dL — ABNORMAL HIGH (ref 8–23)
BUN: 40 mg/dL — ABNORMAL HIGH (ref 8–23)
CO2: 28 mmol/L (ref 22–32)
CO2: 21 mmol/L — ABNORMAL LOW (ref 22–32)
Calcium: 9.1 mg/dL (ref 8.9–10.3)
Calcium: 9 mg/dL (ref 8.9–10.3)
Chloride: 98 mmol/L (ref 98–111)
Chloride: 99 mmol/L (ref 98–111)
Creatinine, Ser: 1.43 mg/dL — ABNORMAL HIGH (ref 0.44–1.00)
Creatinine, Ser: 1.62 mg/dL — ABNORMAL HIGH (ref 0.44–1.00)
GFR, Estimated: 36 mL/min — ABNORMAL LOW (ref >60)
GFR, Estimated: 31 mL/min — ABNORMAL LOW (ref >60)
Glucose, Bld: 146 mg/dL — ABNORMAL HIGH (ref 70–99)
Glucose, Bld: 167 mg/dL — ABNORMAL HIGH (ref 70–99)
Potassium: 5.1 mmol/L (ref 3.5–5.1)
Potassium: 5.3 mmol/L — ABNORMAL HIGH (ref 3.5–5.1)
Sodium: 137 mmol/L (ref 135–145)
Sodium: 136 mmol/L (ref 135–145)

## 2023-01-07 LAB — ECHOCARDIOGRAM COMPLETE
AR max vel: 1.77 cm2
AV Area VTI: 1.48 cm2
AV Area mean vel: 1.5 cm2
AV Mean grad: 2 mmHg
AV Peak grad: 4.7 mmHg
Ao pk vel: 1.08 m/s
Area-P 1/2: 3.33 cm2
Height: 66 in
MV VTI: 0.98 cm2
S' Lateral: 2.9 cm
Weight: 2229.29 oz

## 2023-01-07 LAB — CBC
HCT: 38.7 % (ref 36.0–46.0)
Hemoglobin: 11.8 g/dL — ABNORMAL LOW (ref 12.0–15.0)
MCH: 30.1 pg (ref 26.0–34.0)
MCHC: 30.5 g/dL (ref 30.0–36.0)
MCV: 98.7 fL (ref 80.0–100.0)
Platelets: 174 10*3/uL (ref 150–400)
RBC: 3.92 MIL/uL (ref 3.87–5.11)
RDW: 16 % — ABNORMAL HIGH (ref 11.5–15.5)
WBC: 4.9 10*3/uL (ref 4.0–10.5)
nRBC: 0 % (ref 0.0–0.2)

## 2023-01-07 LAB — BLOOD GAS, VENOUS
Acid-base deficit: 1.8 mmol/L (ref 0.0–2.0)
Bicarbonate: 25.8 mmol/L (ref 20.0–28.0)
O2 Content: 9 L/min
O2 Saturation: 23 %
Patient temperature: 37
pCO2, Ven: 55 mmHg (ref 44–60)
pH, Ven: 7.28 (ref 7.25–7.43)
pO2, Ven: <31 mmHg — CL (ref 32–45)

## 2023-01-07 LAB — GLUCOSE, CAPILLARY
Glucose-Capillary: 114 mg/dL — ABNORMAL HIGH (ref 70–99)
Glucose-Capillary: 144 mg/dL — ABNORMAL HIGH (ref 70–99)
Glucose-Capillary: 148 mg/dL — ABNORMAL HIGH (ref 70–99)
Glucose-Capillary: 150 mg/dL — ABNORMAL HIGH (ref 70–99)
Glucose-Capillary: 162 mg/dL — ABNORMAL HIGH (ref 70–99)
Glucose-Capillary: 165 mg/dL — ABNORMAL HIGH (ref 70–99)

## 2023-01-07 LAB — LACTIC ACID, PLASMA
Lactic Acid, Venous: 3.9 mmol/L (ref 0.5–1.9)
Lactic Acid, Venous: 3.6 mmol/L (ref 0.5–1.9)
Lactic Acid, Venous: 2.8 mmol/L (ref 0.5–1.9)

## 2023-01-07 LAB — URINALYSIS, COMPLETE (UACMP) WITH MICROSCOPIC
Bacteria, UA: NONE SEEN
Bilirubin Urine: NEGATIVE
Glucose, UA: NEGATIVE mg/dL
Hgb urine dipstick: NEGATIVE
Ketones, ur: NEGATIVE mg/dL
Leukocytes,Ua: NEGATIVE
Nitrite: NEGATIVE
Protein, ur: 30 mg/dL — AB
Specific Gravity, Urine: 1.038 — ABNORMAL HIGH (ref 1.005–1.030)
Squamous Epithelial / HPF: NONE SEEN /HPF (ref 0–5)
pH: 5 (ref 5.0–8.0)

## 2023-01-07 LAB — PROCALCITONIN: Procalcitonin: <0.1 ng/mL

## 2023-01-07 LAB — PHOSPHORUS: Phosphorus: 5.5 mg/dL — ABNORMAL HIGH (ref 2.5–4.6)

## 2023-01-07 LAB — COMPREHENSIVE METABOLIC PANEL
ALT: 22 U/L (ref 0–44)
AST: 42 U/L — ABNORMAL HIGH (ref 15–41)
Albumin: 3.6 g/dL (ref 3.5–5.0)
Alkaline Phosphatase: 129 U/L — ABNORMAL HIGH (ref 38–126)
Anion gap: 11 (ref 5–15)
BUN: 37 mg/dL — ABNORMAL HIGH (ref 8–23)
CO2: 24 mmol/L (ref 22–32)
Calcium: 8.9 mg/dL (ref 8.9–10.3)
Chloride: 102 mmol/L (ref 98–111)
Creatinine, Ser: 1.64 mg/dL — ABNORMAL HIGH (ref 0.44–1.00)
GFR, Estimated: 31 mL/min — ABNORMAL LOW (ref >60)
Glucose, Bld: 172 mg/dL — ABNORMAL HIGH (ref 70–99)
Potassium: 5.8 mmol/L — ABNORMAL HIGH (ref 3.5–5.1)
Sodium: 137 mmol/L (ref 135–145)
Total Bilirubin: 1.6 mg/dL — ABNORMAL HIGH (ref 0.3–1.2)
Total Protein: 7.6 g/dL (ref 6.5–8.1)

## 2023-01-07 LAB — BLOOD GAS, ARTERIAL
Acid-base deficit: 0.7 mmol/L (ref 0.0–2.0)
Bicarbonate: 23.4 mmol/L (ref 20.0–28.0)
O2 Content: 8 L/min
O2 Saturation: 80.7 %
Patient temperature: 37
pCO2 arterial: 36 mmHg (ref 32–48)
pH, Arterial: 7.42 (ref 7.35–7.45)
pO2, Arterial: 52 mmHg — ABNORMAL LOW (ref 83–108)

## 2023-01-07 LAB — POTASSIUM
Potassium: 5.4 mmol/L — ABNORMAL HIGH (ref 3.5–5.1)
Potassium: 5.1 mmol/L (ref 3.5–5.1)

## 2023-01-07 LAB — PROTIME-INR
INR: 2.9 — ABNORMAL HIGH (ref 0.8–1.2)
Prothrombin Time: 30.6 seconds — ABNORMAL HIGH (ref 11.4–15.2)

## 2023-01-07 LAB — BRAIN NATRIURETIC PEPTIDE: B Natriuretic Peptide: 2113 pg/mL — ABNORMAL HIGH (ref 0.0–100.0)

## 2023-01-07 LAB — TROPONIN I (HIGH SENSITIVITY): Troponin I (High Sensitivity): 36 ng/L — ABNORMAL HIGH (ref <18)

## 2023-01-07 LAB — MAGNESIUM: Magnesium: 2.3 mg/dL (ref 1.7–2.4)

## 2023-01-07 MED ORDER — LACTATED RINGERS IV BOLUS
500.0000 mL | Freq: Once | INTRAVENOUS | Status: AC
  Administered 2023-01-07: 500 mL via INTRAVENOUS

## 2023-01-07 MED ORDER — REVEFENACIN 175 MCG/3ML IN SOLN
175.0000 ug | Freq: Every day | RESPIRATORY_TRACT | Status: DC
  Administered 2023-01-07: 175 ug via RESPIRATORY_TRACT
  Filled 2023-01-07: qty 3

## 2023-01-07 MED ORDER — POLYETHYLENE GLYCOL 3350 17 G PO PACK: 17.0000 g | PACK | Freq: Every day | ORAL | Status: DC | PRN

## 2023-01-07 MED ORDER — IPRATROPIUM-ALBUTEROL 0.5-2.5 (3) MG/3ML IN SOLN
3.0000 mL | Freq: Three times a day (TID) | RESPIRATORY_TRACT | Status: DC
  Administered 2023-01-07: 3 mL via RESPIRATORY_TRACT
  Filled 2023-01-07: qty 3

## 2023-01-07 MED ORDER — BUDESONIDE 0.5 MG/2ML IN SUSP
0.5000 mg | Freq: Two times a day (BID) | RESPIRATORY_TRACT | Status: DC
  Administered 2023-01-07 – 2023-01-10 (×7): 0.5 mg via RESPIRATORY_TRACT
  Filled 2023-01-07 (×7): qty 2

## 2023-01-07 MED ORDER — INSULIN ASPART 100 UNIT/ML IJ SOLN
0.0000 [IU] | INTRAMUSCULAR | Status: DC
  Administered 2023-01-07 (×3): 1 [IU] via SUBCUTANEOUS
  Administered 2023-01-07 – 2023-01-08 (×2): 2 [IU] via SUBCUTANEOUS
  Administered 2023-01-08 – 2023-01-09 (×3): 1 [IU] via SUBCUTANEOUS
  Administered 2023-01-09: 2 [IU] via SUBCUTANEOUS
  Administered 2023-01-10 (×2): 1 [IU] via SUBCUTANEOUS
  Filled 2023-01-07 (×10): qty 1

## 2023-01-07 MED ORDER — ACETAMINOPHEN 325 MG PO TABS
650.0000 mg | ORAL_TABLET | Freq: Four times a day (QID) | ORAL | Status: DC | PRN
  Administered 2023-01-07 – 2023-01-09 (×6): 650 mg via ORAL
  Filled 2023-01-07 (×6): qty 2

## 2023-01-07 MED ORDER — THIAMINE MONONITRATE 100 MG PO TABS
100.0000 mg | ORAL_TABLET | Freq: Every day | ORAL | Status: DC
  Administered 2023-01-07 – 2023-01-10 (×4): 100 mg via ORAL
  Filled 2023-01-07 (×4): qty 1

## 2023-01-07 MED ORDER — DOCUSATE SODIUM 100 MG PO CAPS
100.0000 mg | ORAL_CAPSULE | Freq: Two times a day (BID) | ORAL | Status: DC | PRN
  Administered 2023-01-07: 100 mg via ORAL
  Filled 2023-01-07: qty 1

## 2023-01-07 MED ORDER — SODIUM CHLORIDE 0.9 % IV SOLN
500.0000 mg | INTRAVENOUS | Status: DC
  Administered 2023-01-08 – 2023-01-10 (×3): 500 mg via INTRAVENOUS
  Filled 2023-01-07 (×4): qty 5

## 2023-01-07 MED ORDER — MENTHOL 3 MG MT LOZG
1.0000 | LOZENGE | OROMUCOSAL | Status: DC | PRN
  Administered 2023-01-07: 3 mg via ORAL
  Filled 2023-01-07: qty 9

## 2023-01-07 MED ORDER — IPRATROPIUM-ALBUTEROL 0.5-2.5 (3) MG/3ML IN SOLN
3.0000 mL | RESPIRATORY_TRACT | Status: DC
  Administered 2023-01-07 – 2023-01-08 (×6): 3 mL via RESPIRATORY_TRACT
  Filled 2023-01-07 (×6): qty 3

## 2023-01-07 MED ORDER — APIXABAN 5 MG PO TABS
5.0000 mg | ORAL_TABLET | Freq: Two times a day (BID) | ORAL | Status: DC
  Administered 2023-01-07 – 2023-01-10 (×7): 5 mg via ORAL
  Filled 2023-01-07 (×7): qty 1

## 2023-01-07 MED ORDER — PERFLUTREN LIPID MICROSPHERE
1.0000 mL | INTRAVENOUS | Status: AC | PRN
  Administered 2023-01-07: 5 mL via INTRAVENOUS

## 2023-01-07 MED ORDER — FOLIC ACID 1 MG PO TABS
1.0000 mg | ORAL_TABLET | Freq: Every day | ORAL | Status: DC
  Administered 2023-01-07 – 2023-01-10 (×4): 1 mg via ORAL
  Filled 2023-01-07 (×4): qty 1

## 2023-01-07 MED ORDER — LEVOTHYROXINE SODIUM 88 MCG PO TABS
88.0000 ug | ORAL_TABLET | Freq: Every day | ORAL | Status: DC
  Administered 2023-01-07 – 2023-01-10 (×4): 88 ug via ORAL
  Filled 2023-01-07 (×6): qty 1

## 2023-01-07 MED ORDER — CHLORHEXIDINE GLUCONATE CLOTH 2 % EX PADS
6.0000 | MEDICATED_PAD | Freq: Every day | CUTANEOUS | Status: DC
  Administered 2023-01-07 – 2023-01-10 (×4): 6 via TOPICAL

## 2023-01-07 MED ORDER — SODIUM ZIRCONIUM CYCLOSILICATE 5 G PO PACK
10.0000 g | PACK | Freq: Once | ORAL | Status: AC
  Administered 2023-01-07: 10 g via ORAL
  Filled 2023-01-07: qty 2

## 2023-01-07 MED ORDER — NOREPINEPHRINE 4 MG/250ML-% IV SOLN
0.0000 ug/min | INTRAVENOUS | Status: DC
  Administered 2023-01-07: 2 ug/min via INTRAVENOUS
  Filled 2023-01-07: qty 250

## 2023-01-07 MED ORDER — PANTOPRAZOLE SODIUM 40 MG PO TBEC
40.0000 mg | DELAYED_RELEASE_TABLET | Freq: Every day | ORAL | Status: DC
  Administered 2023-01-07 – 2023-01-10 (×4): 40 mg via ORAL
  Filled 2023-01-07 (×4): qty 1

## 2023-01-07 MED ORDER — SODIUM CHLORIDE 0.9 % IV SOLN
2.0000 g | INTRAVENOUS | Status: DC
  Administered 2023-01-07 – 2023-01-10 (×4): 2 g via INTRAVENOUS
  Filled 2023-01-07 (×4): qty 20
  Filled 2023-01-07: qty 2

## 2023-01-07 MED ORDER — SODIUM CHLORIDE 0.9 % IV BOLUS
250.0000 mL | Freq: Once | INTRAVENOUS | Status: AC
  Administered 2023-01-07: 250 mL via INTRAVENOUS

## 2023-01-07 MED ORDER — MELATONIN 5 MG PO TABS
5.0000 mg | ORAL_TABLET | Freq: Every day | ORAL | Status: DC
  Administered 2023-01-08 – 2023-01-09 (×3): 5 mg via ORAL
  Filled 2023-01-07 (×3): qty 1

## 2023-01-07 NOTE — Progress Notes (Signed)
*  PRELIMINARY RESULTS* Echocardiogram 2D Echocardiogram has been performed.  Jenny Cooper 01/07/2023, 4:15 PM

## 2023-01-07 NOTE — IPAL (Signed)
  Interdisciplinary Goals of Care Family Meeting   Date carried out: 01/07/2023  Location of the meeting: Bedside  Member's involved: Physician, Nurse Practitioner, and Family Member or next of kin  Durable Power of Attorney or acting medical decision maker: Patient, Xi Pearcey    Discussion: We discussed goals of care for [employer] .  Discussed with patient and her two daughters what her wishes would be should respiratory status were to decompensate. Patient and daughters understand that she has advanced disease (heart failure, pulmonary hypertension, COPD, lung cancer) and clearly expressed that she would not want chest compressions. They would consider short term intubation for reversible conditions. They do not want any tracheostomy tube or prolonged intubation. They are unsure as to when to withdraw care should it come to it, but will give it further consideration.  At this moment, code status changed to DNR, ok to Intubate. No tracheostomy tube.  Code status: DNR, ok to intubate  Disposition: Continue current acute care  Time spent for the meeting: 15 minutes    Raechel Chute, MD  01/07/2023, 1:39 PM

## 2023-01-07 NOTE — Consult Note (Signed)
Cardiology Consult    Patient ID: Jenny Cooper MRN: [id number], DOB/AGE: [birth year]   Admit date: 01/06/2023 Date of Consult: 01/07/2023  Primary Physician: Reubin Milan, MD Primary Cardiologist: Julien Nordmann, MD Requesting Provider: Silverio Decamp, MD  Patient Profile    Jenny Cooper is a 87 y.o. female with a history of CAD s/p remote CABG and MV ring, chronic combined syst/diast CHF, ICM, HTN, HL, permanent Afib, aflutter s/p RFCA, SSS s/p PPM, COPD, remote tob abuse, and stage III squamous cell lung cancer s/p chemoradiation in 2016), who is being seen today for the evaluation of dyspnea at the request of Dr. Aundria Rud.  Past Medical History   Past Medical History:  Diagnosis Date   Acute midline low back pain without sciatica 08/13/2016   Acute on chronic respiratory failure with hypoxia (HCC) 08/21/2018   Acute respiratory failure with hypoxia (HCC) 12/11/2017   Allergy    Aortic atherosclerosis (HCC)    Atypical atrial flutter (HCC)    a.) s/p ablation 07/27/2013 and 05/10/2016   Balance problem    CAD (coronary artery disease)    a. s/p MI x 2 in 2002 s/p PCI x 2 in 2002; b. s/p 2v CABG 2002; c. stress echo 07/2004 w/ evi of pos & inf infarct & no evi of ischemia; d. 4/08 dipyridamole scan w/ multiple areas of infarct, no ischemia, EF 49%; e. cath 04/28/15 3v CAD, med Rx rec, no targets for revasc, LM lum irregs, pLAD 30%, 100%, ost-pLCx 60%, [name] 99%, OM2 100%, p-mRCA 90%, [name] 100% L-R collats, VG-mLAD irregs, VG-OM2 oc   Chronic anticoagulation    a.) Apixaban   Chronic systolic CHF (congestive heart failure) (HCC)    a. echo 03/2015: EF 30-35%, sev ant/inf/pos HK, in mild to mod MR; b. 11/2020 Echo: EF 40-45%, glob HK, nl RV fxn, mod elev PASP, triv MR; c. 11/2021 Echo ([facility]): EF 40%, triv AI, mild MR/TR, mildly reduced RV fxn.   Clavicle fracture 12/08/2022   Closed fracture of right proximal humerus 05/28/2021   Complication of anesthesia    a.) postoperative  apnea. b.) postoperative hypoxia.   Compressed spine fracture (HCC) 08/06/2016   a.) L2, T11, T12   COPD (chronic obstructive pulmonary disease) (HCC)    Deep vein thrombosis (DVT) of left lower extremity (HCC) 12/2001   Fracture of multiple pubic rami, right, closed, initial encounter (HCC) 05/27/2016   a.) RIGHT superior and inferior pubic rami fractures s/p mechanical fall.   GERD (gastroesophageal reflux disease)    HLD (hyperlipidemia)    HTN (hypertension)    Hypothyroidism    Ischemic cardiomyopathy    Mitral regurgitation    a. s/p mitral ring placement 09/2000; b. echo 09/2010: EF 50%, inf HK, post AK, mild MR, prosthetic mitral valve ring w/ peak gradient of 10 mmHg; b. echo 2/13: EF 50%, mild MR/TR      Myocardial infarction (HCC) 2002   a.) x 2 in 2002; PCI with stents (unknown type) placed to the p-mRCA, [name], and [name].   Neuropathy    Osteoarthritis    Osteoporosis    Oxygen dependent    a.) 3 L/[place]   Pacemaker    a. MDT 2002; b. generator replacement 2013; c. followed by Dr. Macon Large, MD   PAF (paroxysmal atrial fibrillation) [facility])    a.) s/p DCCV on 04/08/2017. b.) on apixaban   Personal history of chemotherapy    Personal history of radiation therapy    Pneumonia  S/P CABG x 2 2002   a.) SVG-LAD, SVG-OM   Squamous cell carcinoma of right lung (HCC) 01/03/2015   a.) stage IIIa. b.) s/p concurrent chemotherapy (carboplatin + paclitaxel) and XRT (IMRT 6000 cGy)   SVT (supraventricular tachycardia)    Type II diabetes mellitus with complication (HCC) 11/17/2015    Past Surgical History:  Procedure Laterality Date   APPENDECTOMY     CARDIAC CATHETERIZATION N/A 04/28/2015   Procedure: Left Heart Cath and Coronary Angiography;  Surgeon: Iran Ouch, MD;  Location: [facility];  Service: Cardiovascular;  Laterality: N/A;   CARDIAC ELECTROPHYSIOLOGY STUDY AND ABLATION N/A 05/10/2016   Procedure(s): INTRACARDIAC ELECTROPHYSIOLOGIC 3D MAPPING, STIMULATION  PACING HEART, ICAR CATHETER ABLATION ARRHYTHMIA ADD ON, ABLATE L/R ATRIAL FIBRIL W/ISOLATED PULM VEIN, INTRACARD ECHO, THER/DX INTERVENT; Location: [facility]; Surgeon: Sedalia Muta, MD   CARDIOVERSION N/A 04/08/2017   Procedure: CARDIOVERSION;  Surgeon: Iran Ouch, MD;  Location: [facility];  Service: Cardiovascular;  Laterality: N/A;   CHEST TUBE INSERTION N/A 07/11/2018   Procedure: ZOXWRU CATH INSERTION;  Surgeon: Hulda Marin, MD;  Location: [facility];  Service: Thoracic;  Laterality: N/A;   COLONOSCOPY  12/2009   2 small tubular adenomas   CORONARY ANGIOPLASTY WITH STENT PLACEMENT Left 2002   s/p MI x 2 with subsequent PCI x 2 ; stents (unknown type) placed to the p-mRCA, [name], and [name].   CORONARY ARTERY BYPASS GRAFT  09/2000   DRAIN REMOVAL Right 08/10/2018   Procedure: DRAIN REMOVAL;  Surgeon: Hulda Marin, MD;  Location: [facility];  Service: General;  Laterality: Right;   ECTOPIC PREGNANCY SURGERY     ELECTROMAGNETIC NAVIGATION BROCHOSCOPY N/A 10/06/2015   Procedure: ELECTROMAGNETIC NAVIGATION BRONCHOSCOPY;  Surgeon: Erin Fulling, MD;  Location: [facility];  Service: Cardiopulmonary;  Laterality: N/A;   ENDOBRONCHIAL ULTRASOUND N/A 10/06/2015   Procedure: ENDOBRONCHIAL ULTRASOUND;  Surgeon: Erin Fulling, MD;  Location: [facility];  Service: Cardiopulmonary;  Laterality: N/A;   EYE SURGERY     FLEXIBLE BRONCHOSCOPY Right 07/11/2018   Procedure: FLEXIBLE BRONCHOSCOPY;  Surgeon: Hulda Marin, MD;  Location: [facility];  Service: Thoracic;  Laterality: Right;   IR KYPHO EA ADDL LEVEL THORACIC OR LUMBAR  12/02/2021   IR KYPHO EA ADDL LEVEL THORACIC OR LUMBAR  12/02/2021   IR KYPHO LUMBAR INC FX REDUCE BONE BX UNI/BIL CANNULATION INC/IMAGING  12/02/2021   IR RADIOLOGIST EVAL & MGMT  11/12/2021   IR RADIOLOGIST EVAL & MGMT  01/05/2022   KYPHOPLASTY N/A 09/28/2016   Procedure: KYPHOPLASTY;  Surgeon: Kennedy Bucker, MD;  Location: [facility];  Service: Orthopedics;  Laterality: N/A;   KYPHOPLASTY N/A  02/20/2018   Procedure: [id number];  Surgeon: Kennedy Bucker, MD;  Location: [facility];  Service: Orthopedics;  Laterality: N/A;   PACEMAKER INSERTION  03/2012   REVERSE SHOULDER ARTHROPLASTY Right 06/02/2021   Procedure: REVERSE SHOULDER ARTHROPLASTY;  Surgeon: Christena Flake, MD;  Location: [facility];  Service: Orthopedics;  Laterality: Right;   SVT ABLATION N/A 07/27/2013   Procedure: SVT ABLATION; Location: [facility]; Surgeon: Valeria Batman, MD   thorocentesis  12/24/2016   VAGINAL HYSTERECTOMY     partial - left ovary remains   VIDEO ASSISTED THORACOSCOPY Right 07/11/2018   Procedure: VIDEO ASSISTED THORACOSCOPY;  Surgeon: Hulda Marin, MD;  Location: [facility];  Service: Thoracic;  Laterality: Right;   VIDEO ASSISTED THORACOSCOPY (VATS) W/TALC PLEUADESIS Right 07/11/2018   Procedure: THORACOTOMY PLEURAL BIOPSY WITH TALC PLEURODESIS;  Surgeon: Hulda Marin, MD;  Location: [facility];  Service:  Thoracic;  Laterality: Right;  pleural biopsy     Allergies  Allergies  Allergen Reactions   Cefdinir Rash    Had hives after completing treatment - unsure if it was the cause.  Has received 1st generation cephalosporin many times (04/24/2012, 07/27/2013, 09/28/2016, 02/20/2018, 07/11/2018, 08/10/2018 without documented ADRs.   Entresto [Sacubitril-Valsartan]     Hypotension    Lovenox [Enoxaparin Sodium] Itching   Meperidine Other (See Comments)    Other Reaction: pt does not like how it makes her feel Other reaction(s): Other (See Comments) Other Reaction: pt does not like how it makes her feel    History of Present Illness    87 y.o. female with a history of CAD s/p remote CABG x 2 w/ MV ring (2002), chronic combined syst/diast CHF, ICM, HTN, HL, chronic Afib, atrial flutter, SSS s/p PPM, COPD, remote tob abuse, and stage III squamous cell lung cancer s/p chemoradiation in 2016).  She has been followed by [facility] and underwent catheter ablation for L atrial septal micro reentrant  flutter in 2014.  She subsequently developed Afib w/ progressive increase in burden and underwent PVI and typical flutter ablation in 04/2016, w/ subsequent DCCV for Afib in 12/2016, amio initiation and required several additional DCCVs prior to transitioning off of amio and being managed as permanent Afib.  She has since been managed as permanent Afib w/ metoprolol 100 mg bid and eliquis ([facility] notes indicate 2.5 bid, however our notes indicate 5 BID). CHA2DS2VASc = 7.  Most recent cath in 2016, showed severe native 3VD w/ patent VG  LAD, occluded VG  OM, severe ISR in the mid RCA w/ occlusive distal dzs.  There were no good targets for intervention and she has been medically managed since.  Most recent echo in 2023, showed an EF of 40%, glob HK, mild RV dysfxn, triv AI, mild MR/TR.  Over past few wks, she has been having increased DOE w/ reduced O2 sats @ home.  She, and multiple family members, have been dealing w/ increased sinus drainage and cough (clear sputum).  She was seen 4/26 in cardiology clinic, at which time she was euvolemic on exam, but initial O2 sat was noted to 87% (fingers were noted to be cold).  Labs that day showed mild dehydration w/ creat of 1.11 and she was advised to increase PO fluids and limit to lasix 40mg  once daily.  Since then, DOE has worsened.  She has used supplemental O2 chronically, and has been wearing a mask @ home.  On 5/9, she noted worsening dyspnea and profound presyncope/dizziness.  Family called EMS and pt taken to the ED. Here, she tachypneic and requiring O2 @ 8lpm - BiPAP not required.  She was treated w/ solumedrol, bronchodilators, unasyn, and azithromycin. CTA chest showed no PE but was notable for mild posterolateral LLL and mild R basilar ASD, marked centrilobular emphysema, cirrhosis, and stable, moderate to marked R perihilar scarring (post radiation).  Labs notable for nl H/H and WBC, hyperkalemia (5.9), AKI (creat 1.57), BNP 2113, HsTrops 22  36, lactate  3.9. She became hypotensive in the ED and required 250 ml fluid bolus and norepi (since weaned off).  She was admitted to ICU for further eval.  Currently on HFNC and in no acute distress.  Family @ bedside. Conversing normally.  Inpatient Medications     apixaban  5 mg Oral BID   budesonide  0.5 mg Nebulization BID   Chlorhexidine Gluconate Cloth  6 each Topical  Daily   folic acid  1 mg Oral Daily   insulin aspart  0-9 Units Subcutaneous Q4H   ipratropium-albuterol  3 mL Nebulization Q4H   levothyroxine  88 mcg Oral Q0600   pantoprazole  40 mg Oral Daily   thiamine  100 mg Oral Daily    Family History    Family History  Problem Relation Age of Onset   COPD Mother        sister, and brother   Lung disease Father    Stroke Maternal Grandmother    Hypertension Sister    COPD Sister    Hypertension Brother    COPD Brother    Colon cancer Brother    Heart attack Neg Hx    Breast cancer Neg Hx    She indicated that her mother is deceased. She indicated that her father is deceased. She indicated that her sister is deceased. She indicated that her brother is deceased. She indicated that her maternal grandmother is deceased. She indicated that the status of her neg hx is unknown.   Social History    Social History   Socioeconomic History   Marital status: Widowed    Spouse name: Not on file   Number of children: 6   Years of education: Not on file   Highest education level: Some college, no degree  Occupational History   Occupation: Retired  Tobacco Use   Smoking status: Former    Packs/day: 1.00    Years: 40.00    Additional pack years: 0.00    Total pack years: 40.00    Types: Cigarettes    Quit date: 2001    Years since quitting: 23.3   Smokeless tobacco: Never   Tobacco comments:    quit smoking in 08/28/2000. Smoking cessation materials not required  Vaping Use   Vaping Use: Never used  Substance and Sexual Activity   Alcohol use: Yes    Alcohol/week: 10.0  standard drinks of alcohol    Types: 10 Glasses of wine per week    Comment: 1 glass of wine per day   Drug use: No   Sexual activity: Never  Other Topics Concern   Not on file  Social History Narrative   Pt's son lives with her   Social Determinants of Health   Financial Resource Strain: Low Risk  (09/06/2022)   Overall Financial Resource Strain (CARDIA)    Difficulty of Paying Living Expenses: Not hard at all  Food Insecurity: No Food Insecurity (09/06/2022)   Hunger Vital Sign    Worried About Running Out of Food in the Last Year: Never true    Ran Out of Food in the Last Year: Never true  Transportation Needs: No Transportation Needs (09/06/2022)   PRAPARE - Administrator, Civil Service (Medical): No    Lack of Transportation (Non-Medical): No  Physical Activity: Inactive (09/06/2022)   Exercise Vital Sign    Days of Exercise per Week: 0 days    Minutes of Exercise per Session: 0 min  Stress: No Stress Concern Present (09/06/2022)   [employer] of Occupational Health - Occupational Stress Questionnaire    Feeling of Stress : Not at all  Social Connections: Moderately Isolated (09/06/2022)   Social Connection and Isolation Panel [NHANES]    Frequency of Communication with Friends and Family: More than three times a week    Frequency of Social Gatherings with Friends and Family: Once a week    Attends Religious Services: 1 to  4 times per year    Active Member of Clubs or Organizations: No    Attends Banker Meetings: Never    Marital Status: Widowed  Intimate Partner Violence: Not At Risk (09/06/2022)   Humiliation, Afraid, Rape, and Kick questionnaire    Fear of Current or Ex-Partner: No    Emotionally Abused: No    Physically Abused: No    Sexually Abused: No     Review of Systems    General:  No chills, fever, night sweats or weight changes.  Cardiovascular:  No chest pain, +++ dyspnea on exertion/at rest, no edema, orthopnea, palpitations,  paroxysmal nocturnal dyspnea. +++ presyncope. Dermatological: No rash, lesions/masses Respiratory: No cough, +++ dyspnea Urologic: No hematuria, dysuria Abdominal:   No nausea, vomiting, diarrhea, bright red blood per rectum, melena, or hematemesis Neurologic:  No visual changes, wkns, changes in mental status. All other systems reviewed and are otherwise negative except as noted above.  Physical Exam    Blood pressure 121/71, pulse (!) 54, temperature 98.4 F (36.9 C), temperature source Oral, resp. rate 20, height 5\' 6"  (1.676 m), weight 63.2 kg, SpO2 (!) 89 %.  General: Pleasant, NAD Psych: Normal affect. Neuro: Alert and oriented X 3. Moves all extremities spontaneously. HEENT: Normal  Neck: Supple without bruits or JVD. Lungs:  Resp regular and unlabored, diminished breath sounds bilat w/ diffuse insp/exp wheezing. Heart: RRR no s3, s4, or murmurs. Abdomen: Soft, non-tender, non-distended, BS + x 4.  Extremities: No clubbing, cyanosis or edema. DP/PT2+, Radials 2+ and equal bilaterally.  Labs    Cardiac Enzymes Recent Labs  Lab 01/06/23 2218 01/07/23 0024  TROPONINIHS 22* 36*     BNP    Component Value Date/Time   BNP 2,113.0 (H) 01/06/2023 2218   Lab Results  Component Value Date   WBC 4.9 01/07/2023   HGB 11.8 (L) 01/07/2023   HCT 38.7 01/07/2023   MCV 98.7 01/07/2023   PLT 174 01/07/2023    Recent Labs  Lab 01/07/23 0512 01/07/23 0838  NA 137  --   K 5.8* 5.4*  CL 102  --   CO2 24  --   BUN 37*  --   CREATININE 1.64*  --   CALCIUM 8.9  --   PROT 7.6  --   BILITOT 1.6*  --   ALKPHOS 129*  --   ALT 22  --   AST 42*  --   GLUCOSE 172*  --    Lab Results  Component Value Date   CHOL 116 10/07/2021   HDL 46 10/07/2021   LDLCALC 54 10/07/2021   TRIG 81 10/07/2021      Radiology Studies    CT Angio Chest PE W and/or Wo Contrast  Result Date: 01/07/2023 CLINICAL DATA:  Cough and shortness of breath. EXAM: CT ANGIOGRAPHY CHEST WITH CONTRAST  TECHNIQUE: Multidetector CT imaging of the chest was performed using the standard protocol during bolus administration of intravenous contrast. Multiplanar CT image reconstructions and MIPs were obtained to evaluate the vascular anatomy. RADIATION DOSE REDUCTION: This exam was performed according to the departmental dose-optimization program which includes automated exposure control, adjustment of the mA and/or kV according to patient size and/or use of iterative reconstruction technique. CONTRAST:  60mL OMNIPAQUE IOHEXOL 350 MG/ML SOLN COMPARISON:  June 28, 2021 FINDINGS: Cardiovascular: A properly positioned right-sided venous Port-A-Cath is seen. There is mild to moderate severity calcification of the thoracic aorta. Satisfactory opacification of the pulmonary arteries to the segmental level. No  evidence of pulmonary embolism. There is mild cardiomegaly with moderate severity coronary artery calcification. An artificial mitral valve is seen. There is moderate severity pericardial calcification without evidence of an associated pericardial effusion. Mediastinum/Nodes: Mild, predominant stable AP window lymphadenopathy is present. Thyroid gland, trachea, and esophagus demonstrate no significant findings. Lungs/Pleura: There is marked severity centrilobular emphysematous lung disease. Stable, moderate to marked severity right perihilar scarring and post treatment changes are again seen. Right-sided volume loss is also present. Moderate severity right upper lobe, moderate severity right basilar, mild left upper lobe and mild left basilar scarring and/or atelectasis is also noted. Mild posterolateral left lower lobe and mild superimposed right basilar airspace disease is seen. Left no pleural effusion or pneumothorax is identified. Upper Abdomen: The liver is cirrhotic in appearance. Musculoskeletal: Multiple sternal wires are seen. Multilevel degenerative changes are seen throughout the thoracic spine with  evidence of prior lower thoracic spine vertebroplasty. Review of the MIP images confirms the above findings. IMPRESSION: 1. No evidence of pulmonary embolism. 2. Mild posterolateral left lower lobe and mild right basilar airspace disease. 3. Stable, moderate to marked severity right perihilar scarring and post treatment changes. 4. Marked severity centrilobular emphysematous lung disease. 5. Cardiomegaly with moderate severity coronary artery calcification. 6. Cirrhosis. 7. Aortic atherosclerosis. Aortic Atherosclerosis (ICD10-I70.0) and Emphysema (ICD10-J43.9). Electronically Signed   By: Aram Candela M.D.   On: 01/07/2023 01:57   DG Chest Portable 1 View  Result Date: 01/06/2023 CLINICAL DATA:  Cough and shortness of breath. EXAM: PORTABLE CHEST 1 VIEW COMPARISON:  March 05, 2022 FINDINGS: There is stable dual lead AICD and right-sided venous Port-A-Cath positioning. Multiple sternal wires are noted. An artificial mitral valve is in place. There is mild, stable enlargement of the cardiac silhouette. Stable right-sided volume loss is seen with diffuse, chronic appearing increased interstitial lung markings. Chronic and post therapeutic changes are also seen within the mid right lung and bilateral lung bases. There is no evidence of a pleural effusion or pneumothorax. A right shoulder replacement is seen. Evidence of prior vertebroplasty is noted within the lower thoracic spine. IMPRESSION: 1. Chronic appearing increased interstitial lung markings with chronic and post therapeutic changes within the mid right lung and bilateral lung bases. 2. A mild component of superimposed airspace disease within the bilateral lung bases cannot be excluded. Electronically Signed   By: Aram Candela M.D.   On: 01/06/2023 22:35   DG Thoracic Spine 2 View  Result Date: 12/11/2022 CLINICAL DATA:  Fall, injury EXAM: THORACIC SPINE 2 VIEWS COMPARISON:  03/25/2022 FINDINGS: Exaggerated thoracic kyphosis. Moderate chronic  compression deformity of T9. Mild compression deformities of T7 and T8. Prior cement augmentation of the T11 through L3 levels. No new or progressive fracture is identified when compared with the previous study. IMPRESSION: Multiple chronic thoracolumbar compression deformities. No new or progressive fracture is identified when compared with the previous study. Electronically Signed   By: Duanne Guess D.O.   On: 12/11/2022 15:51   DG Cervical Spine Complete  Result Date: 12/11/2022 CLINICAL DATA:  Fall, injury EXAM: CERVICAL SPINE - COMPLETE 4+ VIEW COMPARISON:  05/19/2021 FINDINGS: There is no evidence of cervical spine fracture or prevertebral soft tissue swelling. Alignment is maintained without traumatic listhesis. Degenerative spondylosis is most pronounced at the C5-6 and C6-7 levels. Distal left clavicular fracture. IMPRESSION: 1. No evidence of acute fracture or traumatic listhesis of the cervical spine. 2. Distal left clavicular fracture. Electronically Signed   By: Duanne Guess D.O.   On:  12/11/2022 15:47   DG Clavicle Left  Result Date: 12/11/2022 CLINICAL DATA:  Fall, injury EXAM: LEFT CLAVICLE - 2+ VIEWS; LEFT SHOULDER - 2+ VIEW COMPARISON:  None Available. FINDINGS: Cortical discontinuity along the distal aspect of the left clavicle, evaluation is somewhat limited secondary to over penetration on clavicular views. This is better seen on grashey view of the shoulder. Left shoulder intact without evidence of fracture. Glenohumeral and acromioclavicular joint alignment maintained without dislocation. Mild degenerative changes. No appreciable soft tissue abnormality. IMPRESSION: 1. Appearance suspicious for a nondisplaced fracture of the distal left clavicle. Correlate with point tenderness. 2. Left shoulder intact without evidence of fracture. Electronically Signed   By: Duanne Guess D.O.   On: 12/11/2022 15:46   DG Shoulder Left  Result Date: 12/11/2022 CLINICAL DATA:  Fall,  injury EXAM: LEFT CLAVICLE - 2+ VIEWS; LEFT SHOULDER - 2+ VIEW COMPARISON:  None Available. FINDINGS: Cortical discontinuity along the distal aspect of the left clavicle, evaluation is somewhat limited secondary to over penetration on clavicular views. This is better seen on grashey view of the shoulder. Left shoulder intact without evidence of fracture. Glenohumeral and acromioclavicular joint alignment maintained without dislocation. Mild degenerative changes. No appreciable soft tissue abnormality. IMPRESSION: 1. Appearance suspicious for a nondisplaced fracture of the distal left clavicle. Correlate with point tenderness. 2. Left shoulder intact without evidence of fracture. Electronically Signed   By: Duanne Guess D.O.   On: 12/11/2022 15:46    ECG & Cardiac Imaging    V paced, 60, underlying Afib - personally reviewed.  Assessment & Plan    1.  Acute on chronic hypoxic resp failure/AECOPD/Sepsis:  several wk h/o worsening dyspnea/cough (clear sputum) in the setting of several family members dealing w/ sinus congestion.  No recent fevers/chills, but has noted dropping O2 sats @ home.  Presented to ED 5/9 due to progressive dyspnea and presyncope.  CTA chest neg for PE but notable for mild posterolateral LLL and mild R basilar ASD, marked centrilobular emphysema, cirrhosis, and stable, moderate to marked R perihilar scarring (post radiation).  Labs notable for nl H/H and WBC, hyperkalemia (5.9), AKI (creat 1.57), BNP 2113, HsTrops 22  36, lactate 3.9.  Does not appear to be significantly volume overloaded on exam. With AKI, hold off on diuresis. F/u echo.  Steroids, nebs, abx, per CCM.  2.  Chronic HFrEF/ICM:  EF 40% by echo in 09/2021.  On presentation, BNP elevated, though evidence of AKI as well.  Not particularly volume overloaded on exam.  If bedscale accurate, up 2 lbs since office visit late last month.  Hold off on diuresis @ this time.  F/u echo - ? Low output.  Known PH.  ? blocker on hold  in setting of hypotension briefly req norepi on presentation.  3.  CAD/Demand Ischemia:  s/p remote CABG w/ 1/2 patent grafts on cath in 2016 and severe multivessel dzs w/o targets for intervention.  No recent c/p, but as above, progresive dyspnea.  HsTrops mildly elevated in the setting of above @ 22  36.  F/u echo.  Likely conservative mgmt.  No asa in setting of eliquis.  No ? blocker currently due to hypotension yesterday.  Follow BPs.  Statin held in setting of mildly elev AST w/ cirrhosis on CT.  Follow w/ plan to resume.  4.  Essential HTN/Hypotension:  follow pressures - currently stable.  Home ? blocker and lasix on hold.  5.  HL:  statin currently held w/ mild AST elevation.  6.  Hyperkalemia:  in setting of AKI.  K 5.4 this AM.  Mgmt per CCM.  7.  AKI:  Creat up to 1.64 this AM after reducing lasix in outpt setting recently.  Follow.  8.  Permanent Afib:  V paced w/ underlying afib.  Rates controlled on ? blocker at home - currently on hold.  Anticoagulated w/ eliquis.  [facility] notes indicate 2.5 bid, however ours indicate 5 bid.  At current creat, 2.5 bid is correct dose, however, in absence of AKI, should be 5 bid as outpt.  Follow creat during admission - if doesn't improve, will have to drop to 2.5 bid.  9.  S/p MVR:  in 2002 @ time of CABG.  Mild MR on 2022 echo.  Repeat pending.  10 Presyncope:  in setting of #1.  Suspect hypotensive @ home.  Will ask EP to interrogate device.   Risk Assessment/Risk Scores:          CHA2DS2-VASc Score = 7   This indicates a 11.2% annual risk of stroke. The patient's score is based upon: CHF History: 1 HTN History: 1 Diabetes History: 1 Stroke History: 0 Vascular Disease History: 1 Age Score: 2 Gender Score: 1     Signed, Nicolasa Ducking, NP 01/07/2023, 11:09 AM  For questions or updates, please contact   Please consult [email or web address] for contact info under Cardiology/STEMI.

## 2023-01-07 NOTE — ED Provider Notes (Signed)
Care assumed of patient from outgoing provider.  See their note for initial history, exam and plan.  Clinical Course as of 01/07/23 0113  Thu Jan 06, 2023  2303 History of lung CA in remission, COPD on home 4L now hypoxic requiring 8L.  Clinically with CAP and ? PNA on CXR.  Labs and CTA pending. Admit  [SM]    Clinical Course User Index [SM] Corena Herter, MD   1:10 AM\  Called to the patient's room, patient has a blood pressure of 77/54.  Normal mentation.  Good peripheral perfusion.  Repeat troponin mildly elevated at 33.  Repeat CMP with a potassium that is now 5.1.  Started on peripheral Levophed.  Patient has already received IV antibiotics, felt that it would be detrimental to hold antibiotics for blood cultures earlier given her findings of possible pneumonia.  Will add on blood cultures and a lactic acid given her hypotension.  Patient already received IV Unasyn and azithromycin.  EKG consistent with LVH, this appears changed when compared to prior EKG.  No significant hyperkalemia on repeat BMP.  Patient has a significantly elevated BNP.  Will give a 250 bolus of IV fluids.  Patient takes daily Lasix.  Does have crackles to lower lung fields but no peripheral edema on my exam.  Decreased urine output today.  .Critical Care  Performed by: Corena Herter, MD Authorized by: Corena Herter, MD   Critical care provider statement:    Critical care time (minutes):  30   Critical care time was exclusive of:  Separately billable procedures and treating other patients   Critical care was necessary to treat or prevent imminent or life-threatening deterioration of the following conditions:  Circulatory failure   Critical care was time spent personally by me on the following activities:  Development of treatment plan with patient or surrogate, discussions with consultants, evaluation of patient's response to treatment, examination of patient, ordering and review of laboratory studies,  ordering and review of radiographic studies, ordering and performing treatments and interventions, pulse oximetry, re-evaluation of patient's condition and review of old charts       Corena Herter, MD 01/07/23 [phone]

## 2023-01-07 NOTE — ED Notes (Signed)
Pt refused second BC.

## 2023-01-07 NOTE — H&P (Signed)
NAME:  Jenny Cooper, MRN:  [id number], DOB:  09/21/1935, LOS: 0 ADMISSION DATE:  01/06/2023, CONSULTATION DATE: 01/07/2023 REFERRING MD: Dr. Arnoldo Morale, CHIEF COMPLAINT: Shortness of breath  History of Present Illness:  87 yo F presenting to [facility] ED from home via EMS on 01/06/2023 for evaluation of progressive generalized fatigue, hypoxia and congestion. History obtained via chart review and bedside interview with patient and daughter with whom she lives. Patient moved in with daughter 3 to 4 weeks ago after fracturing her clavicle with a fall.  She is normally on 4 L nasal cannula but this was increased to 5 L over the last few weeks.  Her son recently came down with a viral respiratory infection and he visited her few days ago.  Daughter describes that over the past 2 weeks the patient appears to be getting progressively weaker not ambulating as well and more fatigued during ADLs.  For the last day and a half she has developed congestion with a productive cough of clear sputum and on 01/06/2023 she started becoming hypoxic.  Patient denies chest pain, fever/chills, urinary symptoms, nausea/vomiting, edema or blurred vision.  She does endorse some intermittent abdominal pain and frequent stools but has also been taking MiraLAX and reports that is its usual effect.  She also describes intermittent issues with bloating but only mild lower abdominal distention at this time.  She has had 2 major falls over the past 3 to 4 years injuring both shoulders.  She and daughter describe that with certain motions such as turning her head she can syncopize suddenly, but this has not occurred recently.  ED course: Upon arrival patient alert and oriented but requiring nonrebreather mask to avoid hypoxia.  Vital signs stable initially with the exception of tachypnea.  Crackles auscultated in bilateral lungs, but patient moving good volumes not requiring BiPAP support.  BNP significantly elevated at greater than 2000, but due to  marginal hemodynamics she was unable to receive Lasix. CT imaging obtained to rule out PE, which was negative showing underlying scarring from [facility] lung cancer and chronic changes secondary to emphysema as well as mild bilateral basilar airspace disease.  Imaging also revealed a new finding of cirrhosis. While in ED patient became hypotensive with SBP 60s to 70s, she received 250 mL IVF bolus and peripheral Levophed was ultimately started.  Sepsis protocol was initiated with labs significant for lactic acidosis, AKI & and initial NAGMA that has now resolved, Transaminitis, elevated but flat troponin suggestive of demand ischemia and hyperglycemia. Medications given: Albuterol, Solu-Medrol, 250 mL bolus, Unasyn and azithromycin, IV contrast & Levophed drip initiated Initial Vitals: 97.7, 30, 60, 126/62 and 98% on nonrebreather Significant labs: (Labs/ Imaging personally reviewed) I, Cheryll Cockayne Rust-Chester, AGACNP-BC, personally viewed and interpreted this ECG. EKG Interpretation: Date: 01/07/23, EKG Time: 04:44, Rate: 60, Rhythm: Junctional, QRS Axis:   Intervals: LAD, nonspecific IVCD, ST/T Wave abnormalities: Nonspecific T wave abnormalities, Narrative Interpretation: New junctional rhythm with nonspecific IVCD and LAD Chemistry: Na+: 137, K+: 5.9 > 5.1, BUN/Cr.: 34/1.43, Serum CO2/ AG: 28/11, Alk Phos: 156,  AST/ALT: 44/22, T.Bili: 1.3 Hematology: WBC: 5.4, Hgb: 12.3,  Troponin: 22 > 36, BNP: 2113, Lactic/ PCT: 3.9/pending, COVID-19 & Influenza A/B: negative  ABG: 7.42/36/52/23.4 CXR 01/06/23: Chronic appearing increased interstitial lung markings with chronic and post therapeutic changes within the mid right lung and bilateral lung bases. A mild component of superimposed airspace disease within the bilateral lung bases cannot be excluded. CT angio chest 01/07/23: No evidence of  pulmonary embolism. Mild posterolateral left lower lobe and mild right basilar airspace disease. Stable, moderate to marked  severity right perihilar scarring and post treatment changes. Marked severity centrilobular emphysematous lung disease. Cardiomegaly with moderate severity coronary artery calcification. Cirrhosis. Aortic atherosclerosis.  PCCM consulted for admission due to acute hypoxic respiratory failure and suspected sepsis secondary to possible In the setting of HFrEF exacerbation & COPD requiring peripheral vasopressor support  Pertinent  Medical History  Aortic atherosclerosis Atypical atrial flutter status post ablation 2014 PAF on Eliquis DVT CAD status post CABG x 2 HFrEF (echo 2023: LVEF 40%, mild RV systolic dysfunction, trivial AR/mild MR/mild TR with prosthetic MV ring) PPM in place ICM Hypertension Hyperlipidemia COPD on chronic 4 to 5 L nasal cannula Type 2 diabetes mellitus Hypothyroidism Squamous cell carcinoma of the right lung stage III AA status post concurrent chemotherapy and XRT (2016) SVT Significant Hospital Events: Including procedures, antibiotic start and stop dates in addition to other pertinent events   01/07/23: Admit to ICU due to acute hypoxic respiratory failure & suspected sepsis s/t possible CAP in the setting of HFrEF exacerbation and COPD requiring peripheral levophed.  Interim History / Subjective:  Patient alert and oriented, transitioned to [facility] from [facility]. Daughter bedside, plan of care discussed and all questions and concerns answered at this time. Prolonged GOC discussion after patient voiced a question of when some interventions are " too much" at her age.  Patient discussed not wanting to be on prolonged support, not wanting to suffer unnecessarily or have reduction in her quality of life.  However at this time confirmed full CODE STATUS.  She also stated at some point in the near future she may want to consider DNR status. Due to multiple comorbidities encouraged ongoing open communication with her 6 children regarding her thoughts and feelings on the  subject.  Objective   Blood pressure 94/61, pulse (!) 59, temperature 97.7 F (36.5 C), temperature source Axillary, resp. rate (!) 26, weight 64.3 kg, SpO2 100 %.       No intake or output data in the 24 hours ending 01/07/23 0329 Filed Weights   01/06/23 2210  Weight: 64.3 kg    Examination: General: Adult female, acutely ill, lying in bed, NAD HEENT: MM pink/moist, anicteric, atraumatic, neck supple Neuro: A&O x 4, able to follow commands, PERRL +3, MAE CV: s1s2 junctional rhythm on monitor, no r/g, + murmur Pulm: Regular, non labored on 8 L HHFNC, breath sounds clear-BUL & fine crackles-BLL GI: soft, rounded, non tender, bs x 4 Skin: Bruise noted on right cheek that patient reported was from NRB mask.  Scattered ecchymosis Extremities: warm/dry, pulses + 2 R/P, no edema noted  Resolved Hospital Problem list     Assessment & Plan:  Acute Hypoxic Respiratory Failure multifocal secondary to possible CAP & HFrEF exacerbation in the setting of COPD PMHx: SCCC of right lung, COPD on chronic 4 to 5 L O2 - Continue HHFNC overnight, wean FiO2 as tolerated - Supplemental O2 to maintain SpO2 > 90% - Intermittent chest x-ray & ABG PRN - Ensure adequate pulmonary hygiene  - F/u cultures, trend PCT - Continue CAP coverage: ceftriaxone & azithromycin  -Continue outpatient regimen: budesonide nebs BID, DuoNebs 3 times daily, Yupelri daily, bronchodilators PRN  Suspected sepsis with septic +/- cardiogenic shock due to suspected CAP  lactic: 3.9, Baseline PCT: Pending, UA: Pending  Initial interventions/workup included: 250 mL of NS/LR & Unasyn/azithromycin -Due to severity of lactic acidosis additional 500 mL bolus ordered,  20 mL per/kg bolus with patient's weight would be around 1200 mL however being conservative given HFrEF exacerbation.   - f/u cultures, trend lactic/ PCT > viral 20 pathogen respiratory panel sent - Daily CBC, monitor WBC/ fever curve - IV antibiotics: ceftriaxone  (patient with allergy noted to cefdinir, however per Rx investigation she has had ceftriaxone since that possible reaction) & azithromycin  - Continue vasopressors to maintain MAP< 65: norepinephrine  Acute on Chronic HFrEF exacerbation New appearing junctional rhythm PAF on Eliquis PMHx: HFrEF, A-flutter, PAF, CAD status post CABG x 2, ICM, SVT, hypertension, DVT, HLD - Echocardiogram ordered - f/u BNP, lipid panel - Continuous cardiac monitoring - Daily weights to assess volume status - Diurese with the use of IV lasix PRN, as hemodynamics and renal function tolerate, holding diuresis for now due to hypotension and suspected sepsis requiring IVF -Continue outpatient Eliquis -Hold outpatient Lasix & Toprol XL while on vasopressor support, consider restarting as patient stabilizes - Consult Cardiology PRN  Syncope with position changes  This does not sound recent but has been a problem - carotid US ordered - echo ordered as above - Fall precautions  Acute Kidney Injury  Baseline Cr: 0.95 (this year Cr became elevated, unclear if AKI or the beginnings of CKD), Cr on admission: 1.57 > now 1.43 - Strict I/O's: alert provider if UOP < 0.5 mL/kg/hr - gentle IVF hydration  - Daily BMP, replace electrolytes PRN - Avoid nephrotoxic agents as able, ensure adequate renal perfusion - consider renal US > f/u UA, trend kidney function  Transaminitis suspect secondary to new finding of cirrhosis PMHx: Intermittent Transaminitis, regular EtOH consumption - Trend hepatic function, f/u INR -Hold outpatient rosuvastatin until LFTs normalize - avoid hepatotoxic agents -Will need GI follow-up outpatient > consider inpatient involvement if LFTs trend up  Regular EtOH consumption History of falls Patient consumes 2 glasses of wine nightly.  Per daughter has never had issues with withdrawal previously. -Thiamine, folic acid, multivitamin ordered -Monitor for CIWA needs -Fall precautions as  above  Type 2 Diabetes Mellitus Hemoglobin A1C: 5.3(12/18/2022) - Monitor CBG Q 4 hours (while NPO) - SSI sensitive dosing - target range while in ICU: 140-180 - follow ICU hyper/hypo-glycemia protocol  Hypothyroidism -Continue outpatient Synthroid Best Practice (right click and "Reselect all SmartList Selections" daily)  Diet/type: NPO w/ oral meds DVT prophylaxis: DOAC GI prophylaxis: PPI Lines: N/A (has port which has not accessed) Foley:  N/A Code Status:  full code Last date of multidisciplinary goals of care discussion [01/07/2023]  Labs   CBC: Recent Labs  Lab 01/06/23 2218  WBC 5.4  NEUTROABS 3.5  HGB 12.3  HCT 41.6  MCV 101.0*  PLT 198    Basic Metabolic Panel: Recent Labs  Lab 01/06/23 2218 01/07/23 0023  NA 135 137  K 5.9* 5.1  CL 96* 98  CO2 23 28  GLUCOSE 234* 146*  BUN 36* 34*  CREATININE 1.57* 1.43*  CALCIUM 9.5 9.1   GFR: Estimated Creatinine Clearance: 27.4 mL/min (A) (by C-G formula based on SCr of 1.43 mg/dL (H)). Recent Labs  Lab 01/06/23 2218 01/07/23 0202  WBC 5.4  --   LATICACIDVEN  --  3.9*    Liver Function Tests: Recent Labs  Lab 01/06/23 2218  AST 44*  ALT 22  ALKPHOS 156*  BILITOT 1.3*  PROT 8.7*  ALBUMIN 4.1   No results for input(s): "LIPASE", "AMYLASE" in the last 168 hours. No results for input(s): "AMMONIA" in the last 168 hours.  ABG    Component Value Date/Time   HCO3 36.1 (H) 04/06/2017 1058   ACIDBASEDEF 2.8 (H) 04/01/2015 1205     Coagulation Profile: No results for input(s): "INR", "PROTIME" in the last 168 hours.  Cardiac Enzymes: No results for input(s): "CKTOTAL", "CKMB", "CKMBINDEX", "TROPONINI" in the last 168 hours.  HbA1C: Hgb A1c MFr Bld  Date/Time Value Ref Range Status  12/24/2022 03:11 PM 5.3 4.8 - 5.6 % Final    Comment:    (NOTE) Pre diabetes:          5.7%-6.4%  Diabetes:              >6.4%  Glycemic control for   <7.0% adults with diabetes   09/28/2019 04:20 PM 5.5 4.8  - 5.6 % Final    Comment:             Prediabetes: 5.7 - 6.4          Diabetes: >6.4          Glycemic control for adults with diabetes: <7.0     CBG: No results for input(s): "GLUCAP" in the last 168 hours.  Review of Systems: Positives in bold  Gen: Denies fever, chills, weight change, fatigue, night sweats HEENT: Denies blurred vision, double vision, hearing loss, tinnitus, sinus congestion, rhinorrhea, sore throat, neck stiffness, dysphagia PULM: Denies shortness of breath, cough, sputum production, hemoptysis, wheezing CV: Denies chest pain, edema, orthopnea, paroxysmal nocturnal dyspnea, palpitations GI: Denies abdominal pain, nausea, vomiting, diarrhea, hematochezia, melena, constipation, change in bowel habits GU: Denies dysuria, hematuria, polyuria, oliguria, urethral discharge Endocrine: Denies hot or cold intolerance, polyuria, polyphagia or appetite change Derm: Denies rash, dry skin, scaling or peeling skin change Heme: Denies easy bruising, bleeding, bleeding gums Neuro: Denies headache, numbness, weakness, slurred speech, loss of memory or consciousness  Past Medical History:  She,  has a past medical history of Acute midline low back pain without sciatica (08/13/2016), Acute on chronic respiratory failure with hypoxia (HCC) (08/21/2018), Acute respiratory failure with hypoxia (HCC) (12/11/2017), Allergy, Aortic atherosclerosis (HCC), Atypical atrial flutter (HCC), Balance problem, CAD (coronary artery disease), Chronic anticoagulation, Chronic systolic CHF (congestive heart failure) (HCC), Clavicle fracture (12/08/2022), Closed fracture of right proximal humerus (05/28/2021), Complication of anesthesia, Compressed spine fracture (HCC) (08/06/2016), COPD (chronic obstructive pulmonary disease) (HCC), Deep vein thrombosis (DVT) of left lower extremity (HCC) (12/2001), Fracture of multiple pubic rami, right, closed, initial encounter (HCC) (05/27/2016), GERD (gastroesophageal  reflux disease), HLD (hyperlipidemia), HTN (hypertension), Hypothyroidism, Ischemic cardiomyopathy, Mitral regurgitation, Myocardial infarction (HCC) (2002), Neuropathy, Osteoarthritis, Osteoporosis, Oxygen dependent, Pacemaker, PAF (paroxysmal atrial fibrillation) (HCC), Personal history of chemotherapy, Personal history of radiation therapy, Pneumonia, S/P CABG x 2 (2002), Squamous cell carcinoma of right lung (HCC) (01/03/2015), SVT (supraventricular tachycardia), and Type II diabetes mellitus with complication (HCC) (11/17/2015).   Surgical History:   Past Surgical History:  Procedure Laterality Date   APPENDECTOMY     CARDIAC CATHETERIZATION N/A 04/28/2015   Procedure: Left Heart Cath and Coronary Angiography;  Surgeon: Iran Ouch, MD;  Location: [facility]ASIVE [facility];  Service: Cardiovascular;  Laterality: N/A;   CARDIAC ELECTROPHYSIOLOGY STUDY AND ABLATION N/A 05/10/2016   Procedure(s): INTRACARDIAC ELECTROPHYSIOLOGIC 3D MAPPING, STIMULATION PACING HEART, ICAR CATHETER ABLATION ARRHYTHMIA ADD ON, ABLATE L/R ATRIAL FIBRIL W/ISOLATED PULM VEIN, INTRACARD ECHO, THER/DX INTERVENT; Location: [facility]; Surgeon: Sedalia Muta, MD   CARDIOVERSION N/A 04/08/2017   Procedure: CARDIOVERSION;  Surgeon: Iran Ouch, MD;  Location: [facility] OR[facility];  Service: Cardiovascular;  Laterality: N/A;  CHEST TUBE INSERTION N/A 07/11/2018   Procedure: ZOXWRU CATH INSERTION;  Surgeon: Hulda Marin, MD;  Location: [facility];  Service: Thoracic;  Laterality: N/A;   COLONOSCOPY  12/2009   2 small tubular adenomas   CORONARY ANGIOPLASTY WITH STENT PLACEMENT Left 2002   s/p MI x 2 with subsequent PCI x 2 ; stents (unknown type) placed to the p-mRCA, [name], and [name].   CORONARY ARTERY BYPASS GRAFT  09/2000   DRAIN REMOVAL Right 08/10/2018   Procedure: DRAIN REMOVAL;  Surgeon: Hulda Marin, MD;  Location: [facility];  Service: General;  Laterality: Right;   ECTOPIC PREGNANCY SURGERY     ELECTROMAGNETIC NAVIGATION  BROCHOSCOPY N/A 10/06/2015   Procedure: ELECTROMAGNETIC NAVIGATION BRONCHOSCOPY;  Surgeon: Erin Fulling, MD;  Location: [facility];  Service: Cardiopulmonary;  Laterality: N/A;   ENDOBRONCHIAL ULTRASOUND N/A 10/06/2015   Procedure: ENDOBRONCHIAL ULTRASOUND;  Surgeon: Erin Fulling, MD;  Location: [facility];  Service: Cardiopulmonary;  Laterality: N/A;   EYE SURGERY     FLEXIBLE BRONCHOSCOPY Right 07/11/2018   Procedure: FLEXIBLE BRONCHOSCOPY;  Surgeon: Hulda Marin, MD;  Location: [facility];  Service: Thoracic;  Laterality: Right;   IR KYPHO EA ADDL LEVEL THORACIC OR LUMBAR  12/02/2021   IR KYPHO EA ADDL LEVEL THORACIC OR LUMBAR  12/02/2021   IR KYPHO LUMBAR INC FX REDUCE BONE BX UNI/BIL CANNULATION INC/IMAGING  12/02/2021   IR RADIOLOGIST EVAL & MGMT  11/12/2021   IR RADIOLOGIST EVAL & MGMT  01/05/2022   KYPHOPLASTY N/A 09/28/2016   Procedure: KYPHOPLASTY;  Surgeon: Kennedy Bucker, MD;  Location: [facility];  Service: Orthopedics;  Laterality: N/A;   KYPHOPLASTY N/A 02/20/2018   Procedure: [id number];  Surgeon: Kennedy Bucker, MD;  Location: [facility];  Service: Orthopedics;  Laterality: N/A;   PACEMAKER INSERTION  03/2012   REVERSE SHOULDER ARTHROPLASTY Right 06/02/2021   Procedure: REVERSE SHOULDER ARTHROPLASTY;  Surgeon: Christena Flake, MD;  Location: [facility];  Service: Orthopedics;  Laterality: Right;   SVT ABLATION N/A 07/27/2013   Procedure: SVT ABLATION; Location: [facility]; Surgeon: Valeria Batman, MD   thorocentesis  12/24/2016   VAGINAL HYSTERECTOMY     partial - left ovary remains   VIDEO ASSISTED THORACOSCOPY Right 07/11/2018   Procedure: VIDEO ASSISTED THORACOSCOPY;  Surgeon: Hulda Marin, MD;  Location: [facility];  Service: Thoracic;  Laterality: Right;   VIDEO ASSISTED THORACOSCOPY (VATS) W/TALC PLEUADESIS Right 07/11/2018   Procedure: THORACOTOMY PLEURAL BIOPSY WITH TALC PLEURODESIS;  Surgeon: Hulda Marin, MD;  Location: [facility];  Service: Thoracic;  Laterality: Right;  pleural biopsy      Social History:   reports that she quit smoking about 23 years ago. Her smoking use included cigarettes. She has a 40.00 pack-year smoking history. She has never used smokeless tobacco. She reports current alcohol use of about 10.0 standard drinks of alcohol per week. She reports that she does not use drugs.   Family History:  Her family history includes COPD in her brother, mother, and sister; Colon cancer in her brother; Hypertension in her brother and sister; Lung disease in her father; Stroke in her maternal grandmother. There is no history of Heart attack or Breast cancer.   Allergies Allergies  Allergen Reactions   Cefdinir Rash    Had hives after completing treatment - unsure if it was the cause.  Has received 1st generation cephalosporin many times (04/24/2012, 07/27/2013, 09/28/2016, 02/20/2018, 07/11/2018, 08/10/2018 without documented ADRs.   Lovenox [Enoxaparin Sodium] Itching   Meperidine Other (See Comments)    Other  Reaction: pt does not like how it makes her feel Other reaction(s): Other (See Comments) Other Reaction: pt does not like how it makes her feel     Home Medications  Prior to Admission medications   Medication Sig Start Date End Date Taking? Authorizing Provider  budesonide (PULMICORT) 0.5 MG/2ML nebulizer solution USE 1 AMPULE (2 ML )VIA NEBULIZER TWO TIMES DAILY 08/02/22   Salena Saner, MD  cetirizine (ZYRTEC) 10 MG tablet TAKE (1) TABLET BY MOUTH EVERY DAY 06/15/18   Reubin Milan, MD  ELIQUIS 5 MG TABS tablet TAKE (1) TABLET BY MOUTH TWICE DAILY 09/30/22   Antonieta Iba, MD  furosemide (LASIX) 20 MG tablet Take 2 tablets (40 mg total) by mouth daily. 12/27/22   Hammock, Lavonna Rua, NP  ipratropium-albuterol (DUONEB) 0.5-2.5 (3) MG/3ML SOLN TAKE 3 MLS BY NEBULIZATION EVERY 6 HOURS AS NEEDED FOR WHEEZING OR SHORTNESS OF BREATH 01/05/23   Erin Fulling, MD  levothyroxine (SYNTHROID) 88 MCG tablet TAKE (1) TABLET BY MOUTH EVERY DAY BEFORE BREAKFAST 01/08/21    Reubin Milan, MD  metoprolol succinate (TOPROL-XL) 100 MG 24 hr tablet Take 1 tablet by mouth 2 (two) times daily. 02/04/22   [provider]  Multiple Vitamins-Minerals (CENTRUM SILVER PO) Take 1 tablet by mouth daily.    [provider]  OXYGEN Inhale 4 L into the lungs.    [provider]  potassium chloride (KLOR-CON) 10 MEQ tablet TAKE (2) TABLETS BY MOUTH TWICE DAILY 01/04/23   Antonieta Iba, MD  rosuvastatin (CRESTOR) 5 MG tablet TAKE (1) TABLET BY MOUTH EVERY DAY 11/23/22   Antonieta Iba, MD  vitamin B-12 (CYANOCOBALAMIN) 1000 MCG tablet Take 1,000 mcg by mouth daily.    [provider]  YUPELRI 175 MCG/3ML nebulizer solution Use one vial in nebulizer once daily. Do not mix with other nebulized medications. 03/18/22   Erin Fulling, MD     Critical care time: 69 minutes       Betsey Holiday, AGACNP-BC Acute Care Nurse Practitioner [facility]   [phone] / [phone] Please see Amion for details.

## 2023-01-07 NOTE — Consult Note (Signed)
PHARMACY CONSULT NOTE - FOLLOW UP  Pharmacy Consult for Electrolyte Monitoring and Replacement   Recent Labs: Potassium (mmol/L)  Date Value  01/07/2023 5.4 (H)  12/24/2014 3.6   Magnesium (mg/dL)  Date Value  16/05/9603 2.3   Calcium (mg/dL)  Date Value  54/04/8118 8.9   Calcium, Total (mg/dL)  Date Value  14/78/2956 9.5   Albumin (g/dL)  Date Value  21/30/8657 3.6  10/07/2021 4.3  12/24/2014 3.9   Phosphorus (mg/dL)  Date Value  84/69/6295 5.5 (H)   Sodium (mmol/L)  Date Value  01/07/2023 137  10/07/2021 141  12/24/2014 136     Assessment: 87 year old female with history of COPD, lung cancer (s/p chemoradiation), and heart failure who presents to the hospital with failure to thrive and acute on chronic hypoxic respiratory failure. Pharmacy has been consulted to monitor and replace electrolytes while under PCCM care.  Goal of Therapy:  Electrolytes WNL  Plan:  - K 5.8>5.4(s/p lokelma 10g x 1 given) - Spoke with CCNP, will re-order lokelma 10g x 1 and recheck K at 2000 this evening - Will recheck all electrolytes with AM labs  Bettey Costa ,PharmD Clinical Pharmacist 01/07/2023 12:41 PM

## 2023-01-07 NOTE — Hospital Course (Signed)
VP, underlying AF, 62

## 2023-01-07 NOTE — Progress Notes (Signed)
eLink Physician-Brief Progress Note Patient Name: Jenny Cooper DOB: [birth year] MRN: [id number]   Date of Service  01/07/2023  HPI/Events of Note  Patient admitted with acute on chronic hypoxemic respiratory failure, r/o pneumonia, elevated BNP, suspected sepsis with evolving shock (elevated lactic acid), imaging evidence of liver cirrhosis, and failure to thrive. Work up is in progress.  eICU Interventions  New Patient Evaluation.        Thomasene Lot Natasha Paulson 01/07/2023, 5:20 AM

## 2023-01-08 ENCOUNTER — Inpatient Hospital Stay: Payer: Medicare Other

## 2023-01-08 DIAGNOSIS — R579 Shock, unspecified: Secondary | ICD-10-CM | POA: Diagnosis not present

## 2023-01-08 DIAGNOSIS — J9621 Acute and chronic respiratory failure with hypoxia: Secondary | ICD-10-CM | POA: Diagnosis not present

## 2023-01-08 DIAGNOSIS — N170 Acute kidney failure with tubular necrosis: Secondary | ICD-10-CM

## 2023-01-08 DIAGNOSIS — J449 Chronic obstructive pulmonary disease, unspecified: Secondary | ICD-10-CM

## 2023-01-08 DIAGNOSIS — I4821 Permanent atrial fibrillation: Secondary | ICD-10-CM | POA: Diagnosis not present

## 2023-01-08 DIAGNOSIS — K7460 Unspecified cirrhosis of liver: Secondary | ICD-10-CM | POA: Diagnosis not present

## 2023-01-08 DIAGNOSIS — N1832 Chronic kidney disease, stage 3b: Secondary | ICD-10-CM

## 2023-01-08 DIAGNOSIS — Z515 Encounter for palliative care: Secondary | ICD-10-CM

## 2023-01-08 DIAGNOSIS — C3490 Malignant neoplasm of unspecified part of unspecified bronchus or lung: Secondary | ICD-10-CM

## 2023-01-08 DIAGNOSIS — I4891 Unspecified atrial fibrillation: Secondary | ICD-10-CM

## 2023-01-08 DIAGNOSIS — I25708 Atherosclerosis of coronary artery bypass graft(s), unspecified, with other forms of angina pectoris: Secondary | ICD-10-CM | POA: Diagnosis not present

## 2023-01-08 DIAGNOSIS — Z7189 Other specified counseling: Secondary | ICD-10-CM

## 2023-01-08 DIAGNOSIS — I5022 Chronic systolic (congestive) heart failure: Secondary | ICD-10-CM | POA: Diagnosis not present

## 2023-01-08 LAB — RENAL FUNCTION PANEL
Albumin: 3.5 g/dL (ref 3.5–5.0)
Anion gap: 7 (ref 5–15)
BUN: 43 mg/dL — ABNORMAL HIGH (ref 8–23)
CO2: 26 mmol/L (ref 22–32)
Calcium: 8.7 mg/dL — ABNORMAL LOW (ref 8.9–10.3)
Chloride: 103 mmol/L (ref 98–111)
Creatinine, Ser: 1.67 mg/dL — ABNORMAL HIGH (ref 0.44–1.00)
GFR, Estimated: 30 mL/min — ABNORMAL LOW (ref >60)
Glucose, Bld: 126 mg/dL — ABNORMAL HIGH (ref 70–99)
Phosphorus: 5.5 mg/dL — ABNORMAL HIGH (ref 2.5–4.6)
Potassium: 4.5 mmol/L (ref 3.5–5.1)
Sodium: 136 mmol/L (ref 135–145)

## 2023-01-08 LAB — RESPIRATORY PANEL BY PCR

## 2023-01-08 LAB — CBC
HCT: 36.5 % (ref 36.0–46.0)
Hemoglobin: 11.1 g/dL — ABNORMAL LOW (ref 12.0–15.0)
MCH: 30 pg (ref 26.0–34.0)
MCHC: 30.4 g/dL (ref 30.0–36.0)
MCV: 98.6 fL (ref 80.0–100.0)
Platelets: 167 10*3/uL (ref 150–400)
RBC: 3.7 MIL/uL — ABNORMAL LOW (ref 3.87–5.11)
RDW: 15.9 % — ABNORMAL HIGH (ref 11.5–15.5)
WBC: 5.1 10*3/uL (ref 4.0–10.5)
nRBC: 0.4 % — ABNORMAL HIGH (ref 0.0–0.2)

## 2023-01-08 LAB — MAGNESIUM: Magnesium: 2.4 mg/dL (ref 1.7–2.4)

## 2023-01-08 LAB — GLUCOSE, CAPILLARY
Glucose-Capillary: 123 mg/dL — ABNORMAL HIGH (ref 70–99)
Glucose-Capillary: 128 mg/dL — ABNORMAL HIGH (ref 70–99)
Glucose-Capillary: 129 mg/dL — ABNORMAL HIGH (ref 70–99)
Glucose-Capillary: 134 mg/dL — ABNORMAL HIGH (ref 70–99)
Glucose-Capillary: 156 mg/dL — ABNORMAL HIGH (ref 70–99)
Glucose-Capillary: 161 mg/dL — ABNORMAL HIGH (ref 70–99)

## 2023-01-08 LAB — PROCALCITONIN: Procalcitonin: <0.1 ng/mL

## 2023-01-08 LAB — STREP PNEUMONIAE URINARY ANTIGEN: Strep Pneumo Urinary Antigen: NEGATIVE

## 2023-01-08 MED ORDER — SODIUM CHLORIDE 0.9 % IV SOLN: INTRAVENOUS | Status: DC | PRN

## 2023-01-08 MED ORDER — OXYMETAZOLINE HCL 0.05 % NA SOLN
1.0000 | Freq: Two times a day (BID) | NASAL | Status: DC
  Administered 2023-01-08 – 2023-01-10 (×4): 1 via NASAL
  Filled 2023-01-08: qty 15

## 2023-01-08 MED ORDER — IPRATROPIUM-ALBUTEROL 0.5-2.5 (3) MG/3ML IN SOLN
3.0000 mL | Freq: Four times a day (QID) | RESPIRATORY_TRACT | Status: DC
  Administered 2023-01-08 (×2): 3 mL via RESPIRATORY_TRACT
  Filled 2023-01-08 (×2): qty 3

## 2023-01-08 MED ORDER — IPRATROPIUM-ALBUTEROL 0.5-2.5 (3) MG/3ML IN SOLN
3.0000 mL | Freq: Three times a day (TID) | RESPIRATORY_TRACT | Status: DC
  Administered 2023-01-09 – 2023-01-10 (×4): 3 mL via RESPIRATORY_TRACT
  Filled 2023-01-08 (×4): qty 3

## 2023-01-08 MED ORDER — PREDNISONE 20 MG PO TABS
40.0000 mg | ORAL_TABLET | Freq: Every day | ORAL | Status: DC
  Administered 2023-01-08 – 2023-01-10 (×3): 40 mg via ORAL
  Filled 2023-01-08 (×3): qty 2

## 2023-01-08 MED ORDER — METOPROLOL SUCCINATE ER 50 MG PO TB24
50.0000 mg | ORAL_TABLET | Freq: Two times a day (BID) | ORAL | Status: DC
  Administered 2023-01-08 – 2023-01-10 (×5): 50 mg via ORAL
  Filled 2023-01-08 (×5): qty 1

## 2023-01-08 NOTE — Assessment & Plan Note (Signed)
No chest pain, checking echo due to elevated troponin, no statin due to elevated LFTs, cardio following 

## 2023-01-08 NOTE — Progress Notes (Signed)
Pt transported to room 245 via bed, accompanied by NT and daughter.

## 2023-01-08 NOTE — Assessment & Plan Note (Addendum)
Septic &/ cardiogenic shock -Lactic acid 3.9 on admission. Initially needed levophed but now weaned off Empiric Rocephin + zithromax although respi panel showing metapneumo virus + which is the likely etio 

## 2023-01-08 NOTE — Progress Notes (Signed)
Report called to Mia on 2A, receiving care nurse for room 245.

## 2023-01-08 NOTE — Progress Notes (Signed)
  Progress Note   Patient: Jenny Cooper ZOX:[id number] DOB: [birth year] DOA: 01/06/2023     1 DOS: the patient was seen and examined on 01/08/2023   Brief hospital course: 87 y.o. female  with past medical history of COPD with chronic respiratory failure requiring 4L [place] supplemental oxygen at baseline, paroxsymal atrial fibrillation on Eliquis, CAD s/p CABG x2, lung cancer (s/p chemoradiation) and HFrEF admitted for Acute on chronic hypoxic respi failure  5/10: admitted to ICU, Cardio c/s, echo 5/11: transfer to PCU & TRH. Palliative care c/s  Assessment and Plan: Acute renal failure with acute tubular necrosis superimposed on stage 3b chronic kidney disease (HCC) Baseline creat 0.95, monitor - consider nephro c/s and renal US if no improvement/worsening  Lab Results  Component Value Date   CREATININE 1.67 (H) 01/08/2023   CREATININE 1.62 (H) 01/07/2023   CREATININE 1.64 (H) 01/07/2023     Shock (HCC) Septic &/ cardiogenic shock -Lactic acid 3.9 on admission. Initially needed levophed but now weaned off Empiric Rocephin + zithromax although respi panel showing metapneumo virus + which is the likely etio  Cirrhosis (HCC) Outpt GI f/up, h/o ETOH use  Squamous cell lung cancer (HCC) Stage 3 s/p chemoradiation in 2016  Coronary artery disease of bypass graft of native heart with stable angina pectoris (HCC) No chest pain, checking echo due to elevated troponin, no statin due to elevated LFTs, cardio following  Acute on chronic respiratory failure with hypoxia (HCC) Initially required HHFNC - weaned off to 6 liter [place] o2 now.  Stage 4 very severe COPD by GOLD classification (HCC) Uses 4 liter O2 at baseline, was using 5 liters before admission to the hospital Add prednisone 40 mg daily Empiric abx Continue nebs & inhalers  Chronic HFrEF (heart failure with reduced ejection fraction) (HCC) Euvolemic, cardio c/s. Holding off diuretics due to AKI and hypotension, echo  pending        Subjective: Getting bedside echo.  Shortness of breath improving  Physical Exam: Vitals:   01/08/23 0800 01/08/23 0825 01/08/23 1000 01/08/23 1106  BP: (!) 125/93   127/86  Pulse: 99 (!) 101 (!) 102 (!) 104  Resp: (!) 33 (!) 23 20 (!) 23  Temp:  97.6 F (36.4 C)    TempSrc:  Oral    SpO2: 95% 96% 95% 94%  Weight:      Height:       87 year old female lying in the bed comfortably without any acute distress Lungs decreased breath sounds at the bases, mild expiratory wheezing throughout both bases Heart irregularly irregular heart rate Abdomen soft, benign Neuro alert and awake, nonfocal Skin no rash or lesion Psych normal mood and affect Data Reviewed:  Respiratory panel positive for metapneumovirus  Family Communication: None at bedside  Disposition: Status is: Inpatient Remains inpatient appropriate because: Management of respiratory failure  Planned Discharge Destination: Home   DVT prophylaxis- Eliquis Time spent: 35 minutes  Author: Delfino Lovett, MD 01/08/2023 12:20 PM  For on call review [email or web address].

## 2023-01-08 NOTE — Assessment & Plan Note (Signed)
Baseline creat 0.95, monitor - consider nephro c/s and renal US if no improvement/worsening  Lab Results  Component Value Date   CREATININE 1.67 (H) 01/08/2023   CREATININE 1.62 (H) 01/07/2023   CREATININE 1.64 (H) 01/07/2023

## 2023-01-08 NOTE — Assessment & Plan Note (Signed)
Euvolemic, cardio c/s. Holding off diuretics due to AKI and hypotension, echo pending 

## 2023-01-08 NOTE — Assessment & Plan Note (Signed)
Stage 3 s/p chemoradiation in 2016

## 2023-01-08 NOTE — Assessment & Plan Note (Addendum)
Uses 4 liter O2 at baseline, was using 5 liters before admission to the hospital Add prednisone 40 mg daily Empiric abx Continue nebs & inhalers

## 2023-01-08 NOTE — Assessment & Plan Note (Signed)
Initially required HHFNC - weaned off to 6 liter [place] o2 now.

## 2023-01-08 NOTE — Assessment & Plan Note (Signed)
Outpt GI f/up, h/o ETOH use

## 2023-01-08 NOTE — Consult Note (Addendum)
Consultation Note Date: 01/08/2023   Patient Name: Jenny Cooper  DOB: 1935/11/30  MRN: [id number]  Age / Sex: 87 y.o., female  PCP: Reubin Milan, MD Referring Physician: Delfino Lovett, MD  Reason for Consultation: Establishing goals of care   HPI/Brief Hospital Course: 87 y.o. female  with past medical history of COPD with chronic respiratory failure requiring 4L [place] supplemental oxygen at baseline, paroxsymal atrial fibrillation on Eliquis, CAD s/p CABG x2, lung cancer (s/p chemoradiation) and HFrEF admitted from home on 01/06/2023 with [facility], increased oxygen requirements, productive cough and generalized weakness.   Recently suffered fall resulting in clavicle fracture-about 3 weeks prior to admission, has been living with her daughter during her recovery  Palliative medicine was consulted for assisting with goals of care conversations.  Subjective:  Extensive chart review has been completed prior to meeting patient including labs, vital signs, imaging, progress notes, orders, and available advanced directive documents from current and previous encounters.  Introduced myself as a Publishing rights manager as a member of the palliative care team. Explained palliative medicine is specialized medical care for people living with serious illness. It focuses on providing relief from the symptoms and stress of a serious illness. The goal is to improve quality of life for both the patient and the family.   Visited with Jenny Cooper at her bedside. Awake and alert, able to participate in conversation, answers orientation questions appropriately. No family present at time of bedside.  Jenny Cooper shares she has been currently living temporarily with her daughter after suffering from a clavicle fracture. Typically lives at home with her son. Prior to admission she was able to independently perform ADL's but over the last couple of weeks she has noticed she has become  progressively weaker and needing assistance with ADL's.  Jenny. Achterberg shares that she is widowed, has 6 children, 4 sons and 2 daughters. Shares that her daughters-[name] and [name] have joint HCPOA but also want her sons to have involvement in goals of care conversations. Reflects on major life events, have lived in several different states. Originally from [place] and most recently moved to [place] from [place]. 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  Jenny Cooper is able to share her understanding of current medical condition and most recent updates. She shares she is aware of her underlying lung disease and is aware of expected disease progression. She is able to recall goals of care conversations had with CCU team yesterday-see iPAL note 5/10. Confirms DNR but would be accepting of short term mechanical ventilation-no tracheostomy. She shares she is focused on improving her quality of life but continues to desire aggressive treatments. She shares her and her son-Steven who she was living with have discussed the need for support in the home and interested in learning about more options available to her. Discussed outpatient Palliative Care services as an extra layer of support for which she is interested in.  Jenny Cooper shares she is starting to feel much better and continues to hope for an ongoing recovery. Plan to return to her daughters home on discharge but with plans to return to her home living with her son. She denies pain or discomfort, improvements in shortness of breath, c/o sore throat which is relived by throat lozenges. Noted bruising to her face which she shares is from wearing her CPAP mask at home too tightly and shares since being on Eliquis she bruises easily.  Encouraged Jenny. Cooper to reach out to PMT for ongoing  needs or support. Offered to return to bedside once family/children arrived and visiting.  I discussed importance of continued conversations with family/support persons  and all members of their medical team regarding overall plan of care and treatment options ensuring decisions are in alignment with patients goals of care.  Addendum: Called back to room with both daughters present. We reviewed current medical condition as well as most recent medical updates. Daughters shares their understanding of underlying chronic respiratory disease. Discussed expected and anticipated chronic disease trajectory for which they were aware. Discussed philosophy of hospice services, transitioning focus towards providing comfort and improving quality of life. At this time, daughters remains hopeful for ongoing recovery with Jenny. Cooper able to return home. Not ready for hospice services at this time. Many questions regarding discharge disposition. Shared role of palliative care as outpatient for which they are interested. Shared the role of TOC for discharge disposition-will connect with TOC.  Objective: Primary Diagnoses: Present on Admission:  Sepsis (HCC)  Acute on chronic HFrEF (heart failure with reduced ejection fraction) (HCC)  Acute on chronic respiratory failure with hypoxia (HCC)  Stage 4 very severe COPD by GOLD classification (HCC)   Physical Exam Constitutional:      General: She is not in acute distress.    Appearance: She is ill-appearing.  Pulmonary:     Effort: Pulmonary effort is normal. No respiratory distress.  Skin:    General: Skin is warm and dry.     Findings: Bruising present.  Neurological:     Mental Status: She is alert.     Motor: Weakness present.  Psychiatric:        Mood and Affect: Mood normal.        Thought Content: Thought content normal.     Vital Signs: BP (!) 125/93 (BP Location: Right Arm)   Pulse (!) 102   Temp 97.6 F (36.4 C) (Oral)   Resp 20   Ht 5\' 6"  (1.676 m)   Wt 63.2 kg   SpO2 95%   BMI 22.49 kg/m  Pain Scale: 0-10   Pain Score: 5   IO: Intake/output summary:  Intake/Output Summary (Last 24 hours) at  01/08/2023 1059 Last data filed at 01/08/2023 1053 Gross per 24 hour  Intake 1079.89 ml  Output 825 ml  Net 254.89 ml    LBM: Last BM Date : 01/06/23 Baseline Weight: Weight: 64.3 kg Most recent weight: Weight: 63.2 kg       Palliative Assessment/Data: 60%   Assessment and Plan  SUMMARY OF RECOMMENDATIONS   DNR but accepting of short term intubation-No tracheostomy Continue current plan of care PMT to continue to follow for ongoing needs and support  Discussed With: Nursing staff   Thank you for this consult and allowing Palliative Medicine to participate in the care of Carlis Abbott. Palliative medicine will continue to follow and assist as needed.   Time Total: 75 minutes  Time spent includes: Detailed review of medical records (labs, imaging, vital signs), medically appropriate exam (mental status, respiratory, cardiac, skin), discussed with treatment team, counseling and educating patient, family and staff, documenting clinical information, medication management and coordination of care.   Signed by: Leeanne Deed, DNP, AGNP-C Palliative Medicine    Please contact Palliative Medicine Team phone at [phone] for questions and concerns.  For individual provider: See Loretha Stapler

## 2023-01-08 NOTE — Hospital Course (Signed)
87 y.o. female  with past medical history of COPD with chronic respiratory failure requiring 4L [place] supplemental oxygen at baseline, paroxsymal atrial fibrillation on Eliquis, CAD s/p CABG x2, lung cancer (s/p chemoradiation) and HFrEF admitted for Acute on chronic hypoxic respi failure  5/10: admitted to ICU, Cardio c/s, echo 5/11: transfer to PCU & TRH. Palliative care c/s 5/12: Hospice evaluation

## 2023-01-08 NOTE — Progress Notes (Signed)
Rounding Note    Patient Name: Jenny Cooper Date of Encounter: 01/08/2023  [facility] Cardiologist: Julien Nordmann, MD   Subjective   Reports coughing more, bringing up more sputum, Off pressors, blood pressure stable Increasing heart rate, restarted this morning on metoprolol On nebulizers, inhalers, prednisone, antibiotics  Inpatient Medications    Scheduled Meds:  apixaban  5 mg Oral BID   budesonide  0.5 mg Nebulization BID   Chlorhexidine Gluconate Cloth  6 each Topical Daily   folic acid  1 mg Oral Daily   insulin aspart  0-9 Units Subcutaneous Q4H   ipratropium-albuterol  3 mL Nebulization Q6H   levothyroxine  88 mcg Oral Q0600   melatonin  5 mg Oral QHS   metoprolol succinate  50 mg Oral BID   pantoprazole  40 mg Oral Daily   predniSONE  40 mg Oral Q breakfast   thiamine  100 mg Oral Daily   Continuous Infusions:  sodium chloride 10 mL/hr at 01/08/23 1100   azithromycin (ZITHROMAX) 500 mg in sodium chloride 0.9 % 250 mL IVPB Stopped (01/08/23 0110)   cefTRIAXone (ROCEPHIN)  IV Stopped (01/08/23 0726)   PRN Meds: sodium chloride, acetaminophen, docusate sodium, menthol-cetylpyridinium, polyethylene glycol   Vital Signs    Vitals:   01/08/23 0800 01/08/23 0825 01/08/23 1000 01/08/23 1106  BP: (!) 125/93   127/86  Pulse: 99 (!) 101 (!) 102 (!) 104  Resp: (!) 33 (!) 23 20 (!) 23  Temp:  97.6 F (36.4 C)    TempSrc:  Oral    SpO2: 95% 96% 95% 94%  Weight:      Height:        Intake/Output Summary (Last 24 hours) at 01/08/2023 1250 Last data filed at 01/08/2023 1100 Gross per 24 hour  Intake 1089.89 ml  Output 825 ml  Net 264.89 ml      01/07/2023    4:30 AM 01/06/2023   10:10 PM 12/24/2022    2:05 PM  Last 3 Weights  Weight (lbs) 139 lb 5.3 oz 141 lb 12.1 oz 134 lb 9.6 oz  Weight (kg) 63.2 kg 64.3 kg 61.054 kg      Telemetry    Atrial fibrillation- Personally Reviewed  ECG     - Personally Reviewed  Physical Exam   GEN:  No acute distress.   Neck: No JVD Cardiac: Irregularly irregular no murmurs, rubs, or gallops.  Respiratory: Coarse breath sounds bilaterally GI: Soft, nontender, non-distended  MS: No edema; No deformity. Neuro:  Nonfocal  Psych: Normal affect   Labs    High Sensitivity Troponin:   Recent Labs  Lab 01/06/23 2218 01/07/23 0024  TROPONINIHS 22* 36*     Chemistry Recent Labs  Lab 01/06/23 2218 01/07/23 0023 01/07/23 0512 01/07/23 0838 01/07/23 1729 01/07/23 1948 01/08/23 0456  NA 135   < > 137  --  136  --  136  K 5.9*   < > 5.8*   < > 5.3* 5.1 4.5  CL 96*   < > 102  --  99  --  103  CO2 23   < > 24  --  21*  --  26  GLUCOSE 234*   < > 172*  --  167*  --  126*  BUN 36*   < > 37*  --  40*  --  43*  CREATININE 1.57*   < > 1.64*  --  1.62*  --  1.67*  CALCIUM 9.5   < >  8.9  --  9.0  --  8.7*  MG  --   --  2.3  --   --   --  2.4  PROT 8.7*  --  7.6  --   --   --   --   ALBUMIN 4.1  --  3.6  --   --   --  3.5  AST 44*  --  42*  --   --   --   --   ALT 22  --  22  --   --   --   --   ALKPHOS 156*  --  129*  --   --   --   --   BILITOT 1.3*  --  1.6*  --   --   --   --   GFRNONAA 32*   < > 31*  --  31*  --  30*  ANIONGAP 16*   < > 11  --  16*  --  7   < > = values in this interval not displayed.    Lipids No results for input(s): "CHOL", "TRIG", "HDL", "LABVLDL", "LDLCALC", "CHOLHDL" in the last 168 hours.  Hematology Recent Labs  Lab 01/06/23 2218 01/07/23 0512 01/08/23 0456  WBC 5.4 4.9 5.1  RBC 4.12 3.92 3.70*  HGB 12.3 11.8* 11.1*  HCT 41.6 38.7 36.5  MCV 101.0* 98.7 98.6  MCH 29.9 30.1 30.0  MCHC 29.6* 30.5 30.4  RDW 15.9* 16.0* 15.9*  PLT 198 174 167   Thyroid No results for input(s): "TSH", "FREET4" in the last 168 hours.  BNP Recent Labs  Lab 01/06/23 2218  BNP 2,113.0*    DDimer No results for input(s): "DDIMER" in the last 168 hours.   Radiology    US Carotid Bilateral  Result Date: 01/08/2023 CLINICAL DATA:  Syncope and collapse. History  of hypertension, CAD and hyperlipidemia. EXAM: BILATERAL CAROTID DUPLEX ULTRASOUND TECHNIQUE: Wallace Cullens scale imaging, color Doppler and duplex ultrasound were performed of bilateral carotid and vertebral arteries in the neck. COMPARISON:  None Available. FINDINGS: Criteria: Quantification of carotid stenosis is based on velocity parameters that correlate the residual internal carotid diameter with NASCET-based stenosis levels, using the diameter of the distal internal carotid lumen as the denominator for stenosis measurement. The following velocity measurements were obtained: RIGHT ICA: 74/25 cm/sec CCA: 54/14 cm/sec SYSTOLIC ICA/CCA RATIO:  1.7 ECA: 52 cm/sec LEFT ICA: 62/23 cm/sec CCA: 61/15 cm/sec SYSTOLIC ICA/CCA RATIO:  1.0 ECA: 78 cm/sec RIGHT CAROTID ARTERY: There is a moderate-to-large amount of eccentric echogenic plaque within the right carotid bulb (image 15). There is a minimal amount of eccentric echogenic plaque involving the origin and proximal aspects of the right internal carotid artery (image 22), not resulting in elevated peak systolic velocities within the interrogated course of the right internal carotid artery to suggest a hemodynamically significant stenosis. RIGHT VERTEBRAL ARTERY:  Antegrade flow LEFT CAROTID ARTERY: There is a minimal amount of eccentric echogenic plaque scattered throughout the left common carotid artery (representative images 39 and 43). There is a minimal amount of eccentric echogenic plaque within left carotid bulb (image 47), extending to involve the origin and proximal aspects of the left internal carotid artery (image 54), not resulting in elevated peak systolic velocities within the interrogated course of the left internal carotid artery to suggest a hemodynamically significant stenosis. LEFT VERTEBRAL ARTERY:  Antegrade flow IMPRESSION: 1. Moderate-to-large amount of right-sided atherosclerotic plaque, not resulting in a hemodynamically significant stenosis. 2. Minimal  amount  of left-sided atherosclerotic plaque, not resulting in a hemodynamically significant stenosis. Electronically Signed   By: Simonne Come M.D.   On: 01/08/2023 12:23   DG Chest Port 1 View  Result Date: 01/07/2023 CLINICAL DATA:  Difficulty breathing. EXAM: PORTABLE CHEST 1 VIEW COMPARISON:  Jan 06, 2023 FINDINGS: There is stable right-sided venous Port-A-Cath and multi lead AICD positioning. Multiple sternal wires are present. There is mild, stable enlargement of the cardiac silhouette with an artificial mitral valve. Mild prominence of the perihilar pulmonary vasculature is seen which is mildly increased in severity when compared to the prior exam. Mild to moderate severity emphysematous lung disease is noted with mild, diffuse, chronic appearing increased interstitial lung markings. Stable chronic and post therapeutic changes are seen within the mid right lung, with additional chronic changes noted within the bilateral lung bases. A mild amount of superimposed bibasilar airspace disease is suspected. Stable right apical and lateral right basal pleural thickening is present. There is no evidence of a pleural effusion or pneumothorax. Prior vertebroplasty is seen within the lower thoracic and upper lumbar spine. A right shoulder replacement is also noted. IMPRESSION: 1. Mild to moderate severity emphysematous lung disease with chronic and post therapeutic changes within the mid right lung and bilateral lung bases. 2. Mild amount of superimposed bibasilar airspace disease. 3. Stable cardiomegaly with mild pulmonary vascular congestion. Electronically Signed   By: Aram Candela M.D.   On: 01/07/2023 22:14   ECHOCARDIOGRAM COMPLETE  Result Date: 01/07/2023    ECHOCARDIOGRAM REPORT   Patient Name:   CARROL HUNKE Date of Exam: 01/07/2023 Medical Rec #:  [id number]        Height:       66.0 in Accession #:    [id number]       Weight:       139.3 lb Date of Birth:  13-Oct-1935        BSA:          1.715 m  Patient Age:    86 years         BP:           112/96 mmHg Patient Gender: F                HR:           61 bpm. Exam Location:  [facility] Procedure: 2D Echo, Cardiac Doppler, Color Doppler and Intracardiac            Opacification Agent Indications:     Cardiomyopathy  History:         Patient has prior history of Echocardiogram examinations, most                  recent 06/24/2021. Cardiomyopathy and CHF, CAD and Previous                  Myocardial Infarction, Pacemaker and Prior CABG, Pulmonary HTN                  and COPD, Arrythmias:Atrial Flutter and Tachycardia; Risk                  Factors:Hypertension, Diabetes and Dyslipidemia.  Sonographer:     Mikki Harbor Referring Phys:  [id number] BRITTON L [place] Diagnosing Phys: Julien Nordmann MD  Sonographer Comments: Technically difficult study due to poor echo windows and suboptimal apical window. Image acquisition challenging due to COPD. IMPRESSIONS  1. Very challenging images  2. Left ventricle not well visualied, grossly  Left ventricular ejection fraction, by estimation, is 45 to 50%. Left ventricular ejection fraction by PLAX is 51 %. The left ventricle has mildly decreased function. The left ventricle demonstrates global hypokinesis.  3. Right ventricle not well visualized, grossly Right ventricular systolic function is severely reduced. The right ventricular size is severely enlarged. There is severely elevated pulmonary artery systolic pressure. The estimated right ventricular systolic pressure is 61.8 mmHg.  4. The mitral valve is normal in structure. No evidence of mitral valve regurgitation. No evidence of mitral stenosis.  5. The aortic valve is normal in structure. Aortic valve regurgitation is not visualized. Aortic valve sclerosis is present, with no evidence of aortic valve stenosis.  6. The inferior vena cava is dilated in size with <50% respiratory variability, suggesting right atrial pressure of 15 mmHg. FINDINGS  Left Ventricle: Left  ventricular ejection fraction, by estimation, is 45 to 50%. Left ventricular ejection fraction by PLAX is 51 %. The left ventricle has mildly decreased function. The left ventricle demonstrates global hypokinesis. Definity contrast agent was given IV to delineate the left ventricular endocardial borders. The left ventricular internal cavity size was normal in size. There is no left ventricular hypertrophy. Left ventricular diastolic parameters are indeterminate. Right Ventricle: The right ventricular size is severely enlarged. No increase in right ventricular wall thickness. Right ventricular systolic function is severely reduced. There is severely elevated pulmonary artery systolic pressure. The tricuspid regurgitant velocity is 3.42 m/s, and with an assumed right atrial pressure of 15 mmHg, the estimated right ventricular systolic pressure is 61.8 mmHg. Left Atrium: Left atrial size was normal in size. Right Atrium: Right atrial size was normal in size. Pericardium: There is no evidence of pericardial effusion. Mitral Valve: The mitral valve is normal in structure. No evidence of mitral valve regurgitation. No evidence of mitral valve stenosis. MV peak gradient, 5.5 mmHg. The mean mitral valve gradient is 2.0 mmHg. Tricuspid Valve: The tricuspid valve is normal in structure. Tricuspid valve regurgitation is not demonstrated. No evidence of tricuspid stenosis. Aortic Valve: The aortic valve is normal in structure. Aortic valve regurgitation is not visualized. Aortic valve sclerosis is present, with no evidence of aortic valve stenosis. Aortic valve mean gradient measures 2.0 mmHg. Aortic valve peak gradient measures 4.7 mmHg. Aortic valve area, by VTI measures 1.48 cm. Pulmonic Valve: The pulmonic valve was normal in structure. Pulmonic valve regurgitation is not visualized. No evidence of pulmonic stenosis. Aorta: The aortic root is normal in size and structure. Venous: The inferior vena cava is dilated in size  with less than 50% respiratory variability, suggesting right atrial pressure of 15 mmHg. IAS/Shunts: No atrial level shunt detected by color flow Doppler.  LEFT VENTRICLE PLAX 2D LV EF:         Left ventricular ejection fraction by PLAX is 51 %. LVIDd:         3.90 cm LVIDs:         2.90 cm LV PW:         0.90 cm LV IVS:        1.00 cm LVOT diam:     2.00 cm LV SV:         34 LV SV Index:   20 LVOT Area:     3.14 cm  RIGHT VENTRICLE RV Basal diam:  5.30 cm RV Mid diam:    5.30 cm LEFT ATRIUM           Index        RIGHT  ATRIUM           Index LA diam:      3.90 cm 2.27 cm/m   RA Area:     23.50 cm LA Vol (A4C): 51.9 ml 30.26 ml/m  RA Volume:   74.90 ml  43.67 ml/m  AORTIC VALVE AV Area (Vmax):    1.77 cm AV Area (Vmean):   1.50 cm AV Area (VTI):     1.48 cm AV Vmax:           108.00 cm/s AV Vmean:          66.900 cm/s AV VTI:            0.230 m AV Peak Grad:      4.7 mmHg AV Mean Grad:      2.0 mmHg LVOT Vmax:         60.70 cm/s LVOT Vmean:        31.900 cm/s LVOT VTI:          0.108 m LVOT/AV VTI ratio: 0.47  AORTA Ao Root diam: 2.80 cm Ao Asc diam:  3.10 cm MITRAL VALVE                TRICUSPID VALVE MV Area (PHT): 3.33 cm     TR Peak grad:   46.8 mmHg MV Area VTI:   0.98 cm     TR Vmax:        342.00 cm/s MV Peak grad:  5.5 mmHg MV Mean grad:  2.0 mmHg     SHUNTS MV Vmax:       1.17 m/s     Systemic VTI:  0.11 m MV Vmean:      59.9 cm/s    Systemic Diam: 2.00 cm MV Decel Time: 228 msec MV E velocity: 109.00 cm/s Julien Nordmann MD Electronically signed by Julien Nordmann MD Signature Date/Time: 01/07/2023/5:50:43 PM    Final    CT Angio Chest PE W and/or Wo Contrast  Result Date: 01/07/2023 CLINICAL DATA:  Cough and shortness of breath. EXAM: CT ANGIOGRAPHY CHEST WITH CONTRAST TECHNIQUE: Multidetector CT imaging of the chest was performed using the standard protocol during bolus administration of intravenous contrast. Multiplanar CT image reconstructions and MIPs were obtained to evaluate the vascular  anatomy. RADIATION DOSE REDUCTION: This exam was performed according to the departmental dose-optimization program which includes automated exposure control, adjustment of the mA and/or kV according to patient size and/or use of iterative reconstruction technique. CONTRAST:  60mL OMNIPAQUE IOHEXOL 350 MG/ML SOLN COMPARISON:  June 28, 2021 FINDINGS: Cardiovascular: A properly positioned right-sided venous Port-A-Cath is seen. There is mild to moderate severity calcification of the thoracic aorta. Satisfactory opacification of the pulmonary arteries to the segmental level. No evidence of pulmonary embolism. There is mild cardiomegaly with moderate severity coronary artery calcification. An artificial mitral valve is seen. There is moderate severity pericardial calcification without evidence of an associated pericardial effusion. Mediastinum/Nodes: Mild, predominant stable AP window lymphadenopathy is present. Thyroid gland, trachea, and esophagus demonstrate no significant findings. Lungs/Pleura: There is marked severity centrilobular emphysematous lung disease. Stable, moderate to marked severity right perihilar scarring and post treatment changes are again seen. Right-sided volume loss is also present. Moderate severity right upper lobe, moderate severity right basilar, mild left upper lobe and mild left basilar scarring and/or atelectasis is also noted. Mild posterolateral left lower lobe and mild superimposed right basilar airspace disease is seen. Left no pleural effusion or pneumothorax is identified. Upper Abdomen: The liver is  cirrhotic in appearance. Musculoskeletal: Multiple sternal wires are seen. Multilevel degenerative changes are seen throughout the thoracic spine with evidence of prior lower thoracic spine vertebroplasty. Review of the MIP images confirms the above findings. IMPRESSION: 1. No evidence of pulmonary embolism. 2. Mild posterolateral left lower lobe and mild right basilar airspace  disease. 3. Stable, moderate to marked severity right perihilar scarring and post treatment changes. 4. Marked severity centrilobular emphysematous lung disease. 5. Cardiomegaly with moderate severity coronary artery calcification. 6. Cirrhosis. 7. Aortic atherosclerosis. Aortic Atherosclerosis (ICD10-I70.0) and Emphysema (ICD10-J43.9). Electronically Signed   By: Aram Candela M.D.   On: 01/07/2023 01:57   DG Chest Portable 1 View  Result Date: 01/06/2023 CLINICAL DATA:  Cough and shortness of breath. EXAM: PORTABLE CHEST 1 VIEW COMPARISON:  March 05, 2022 FINDINGS: There is stable dual lead AICD and right-sided venous Port-A-Cath positioning. Multiple sternal wires are noted. An artificial mitral valve is in place. There is mild, stable enlargement of the cardiac silhouette. Stable right-sided volume loss is seen with diffuse, chronic appearing increased interstitial lung markings. Chronic and post therapeutic changes are also seen within the mid right lung and bilateral lung bases. There is no evidence of a pleural effusion or pneumothorax. A right shoulder replacement is seen. Evidence of prior vertebroplasty is noted within the lower thoracic spine. IMPRESSION: 1. Chronic appearing increased interstitial lung markings with chronic and post therapeutic changes within the mid right lung and bilateral lung bases. 2. A mild component of superimposed airspace disease within the bilateral lung bases cannot be excluded. Electronically Signed   By: Aram Candela M.D.   On: 01/06/2023 22:35    Cardiac Studies     Patient Profile     Ms. Naelani Froio is a 87 year old woman with history of coronary artery disease, CABG, mitral valve ring, chronic diastolic and systolic CHF, ischemic cardiomyopathy, hypertension, permanent atrial fibrillation, sick sinus syndrome/pacemaker, severe COPD, former smoker, history of squamous cell lung cancer prior chemoradiation in 2016 presenting with worsening shortness of  breath   Assessment & Plan    Acute on chronic hypoxic respiratory failure In the setting of severe COPD on chronic oxygen CTA with no PE, significant emphysema noted Known pulmonary hypertension by prior echocardiogram On antibiotics for concern for sepsis requiring pressors on arrival now weaned off -Unable to exclude hypovolemia as a cause of her hypotension No strong indication for aggressive diuresis at this time -Echocardiogram performed with very challenging images even with Definity Would continue steroids, nebs, antibiotics, supportive care Patient is aware of the severity of her underlying lung disease -Agree with palliative -On antibiotics, nebulizers, prednisone, bringing up thick sputum   Coronary artery disease with stable angina History of CABG Prior catheterization 2016 with severe multivessel disease without targets for intervention Denies anginal symptoms concerning for ischemia Medications held in the setting of hypotension -No indication for ischemic workup at this time   chronic renal failure Worsening renal dysfunction over the past 5 months Normal creatinine less than 1 five months ago now  >1.6 and continues to trend upwards Diuretic on hold   Cirrhosis Notes indicating alcohol history to some degree Noted on imaging Total bilirubin 1.6   Permanent atrial fibrillation As blood pressure is stabilized, metoprolol succinate restarted 50 twice daily, as outpatient was taking 100 twice daily Anticoagulated with Eliquis 2.5 twice daily   Status post MVR in 2002 Repeat echo pending   Near syncope Orthostasis symptoms at home when sitting on bedside commode leading to call  to EMS, also with hypotension in the emergency room requiring pressors Symptoms stabilized with fluid boluses, antibiotics, now off pressors   Total encounter time more than 50 minutes  Greater than 50% was spent in counseling and coordination of care with the patient   For questions  or updates, please contact [facility] Please consult [email or web address] for contact info under        Signed, Julien Nordmann, MD  01/08/2023, 12:50 PM

## 2023-01-08 NOTE — Consult Note (Signed)
PHARMACY CONSULT NOTE  Pharmacy Consult for Electrolyte Monitoring and Replacement   Recent Labs: Potassium (mmol/L)  Date Value  01/08/2023 4.5  12/24/2014 3.6   Magnesium (mg/dL)  Date Value  81/19/1478 2.4   Calcium (mg/dL)  Date Value  29/56/2130 8.7 (L)   Calcium, Total (mg/dL)  Date Value  86/57/8469 9.5   Albumin (g/dL)  Date Value  62/95/2841 3.5  10/07/2021 4.3  12/24/2014 3.9   Phosphorus (mg/dL)  Date Value  32/44/0102 5.5 (H)   Sodium (mmol/L)  Date Value  01/08/2023 136  10/07/2021 141  12/24/2014 136     Assessment: 87 year old female with history of COPD, lung cancer (s/p chemoradiation), and heart failure who presents to the hospital with failure to thrive and acute on chronic hypoxic respiratory failure. Pharmacy has been consulted to monitor and replace electrolytes while under PCCM care.  Goal of Therapy:  Electrolytes WNL  Plan:  - no electrolyte replacement warranted for today - Will recheck all electrolytes with AM labs  Lowella Bandy ,PharmD Clinical Pharmacist 01/08/2023 9:13 AM

## 2023-01-09 DIAGNOSIS — J449 Chronic obstructive pulmonary disease, unspecified: Secondary | ICD-10-CM | POA: Diagnosis not present

## 2023-01-09 DIAGNOSIS — Z7189 Other specified counseling: Secondary | ICD-10-CM | POA: Diagnosis not present

## 2023-01-09 DIAGNOSIS — N170 Acute kidney failure with tubular necrosis: Secondary | ICD-10-CM | POA: Diagnosis not present

## 2023-01-09 DIAGNOSIS — I5022 Chronic systolic (congestive) heart failure: Secondary | ICD-10-CM | POA: Diagnosis not present

## 2023-01-09 DIAGNOSIS — C3490 Malignant neoplasm of unspecified part of unspecified bronchus or lung: Secondary | ICD-10-CM | POA: Diagnosis not present

## 2023-01-09 DIAGNOSIS — J9621 Acute and chronic respiratory failure with hypoxia: Secondary | ICD-10-CM | POA: Diagnosis not present

## 2023-01-09 DIAGNOSIS — I25708 Atherosclerosis of coronary artery bypass graft(s), unspecified, with other forms of angina pectoris: Secondary | ICD-10-CM | POA: Diagnosis not present

## 2023-01-09 LAB — GLUCOSE, CAPILLARY
Glucose-Capillary: 114 mg/dL — ABNORMAL HIGH (ref 70–99)
Glucose-Capillary: 112 mg/dL — ABNORMAL HIGH (ref 70–99)
Glucose-Capillary: 126 mg/dL — ABNORMAL HIGH (ref 70–99)
Glucose-Capillary: 139 mg/dL — ABNORMAL HIGH (ref 70–99)
Glucose-Capillary: 150 mg/dL — ABNORMAL HIGH (ref 70–99)

## 2023-01-09 LAB — RENAL FUNCTION PANEL
Albumin: 3.4 g/dL — ABNORMAL LOW (ref 3.5–5.0)
Anion gap: 7 (ref 5–15)
BUN: 39 mg/dL — ABNORMAL HIGH (ref 8–23)
CO2: 28 mmol/L (ref 22–32)
Calcium: 8.6 mg/dL — ABNORMAL LOW (ref 8.9–10.3)
Chloride: 101 mmol/L (ref 98–111)
Creatinine, Ser: 1.55 mg/dL — ABNORMAL HIGH (ref 0.44–1.00)
GFR, Estimated: 33 mL/min — ABNORMAL LOW (ref >60)
Glucose, Bld: 114 mg/dL — ABNORMAL HIGH (ref 70–99)
Phosphorus: 4 mg/dL (ref 2.5–4.6)
Potassium: 4.2 mmol/L (ref 3.5–5.1)
Sodium: 136 mmol/L (ref 135–145)

## 2023-01-09 LAB — CBC
HCT: 36.6 % (ref 36.0–46.0)
Hemoglobin: 11.1 g/dL — ABNORMAL LOW (ref 12.0–15.0)
MCH: 30.1 pg (ref 26.0–34.0)
MCHC: 30.3 g/dL (ref 30.0–36.0)
MCV: 99.2 fL (ref 80.0–100.0)
Platelets: 173 10*3/uL (ref 150–400)
RBC: 3.69 MIL/uL — ABNORMAL LOW (ref 3.87–5.11)
RDW: 16 % — ABNORMAL HIGH (ref 11.5–15.5)
WBC: 5.9 10*3/uL (ref 4.0–10.5)
nRBC: 0.3 % — ABNORMAL HIGH (ref 0.0–0.2)

## 2023-01-09 LAB — CULTURE, BLOOD (ROUTINE X 2)

## 2023-01-09 NOTE — Progress Notes (Signed)
Pt had an episode of brief panic attack, oxygen saturation dropped down to 70%. Pt turned cyanotic. Bumped oxgyen to 7 lpm, high flow nasal cannula. Repositioned pt. Deep breathing exercises done by pt. Intervention effective, O2 sat slowly came up to 92%. Pt felt better afterwards. Informed RT on duty during her rounds. As of the moment, pt is resting, no complaints. Latest spO2: 96%.

## 2023-01-09 NOTE — Assessment & Plan Note (Addendum)
Uses 4-5 liters before admission to the hospital at baseline Continue prednisone 40 mg daily Empiric abx Continue nebs & inhalers

## 2023-01-09 NOTE — Assessment & Plan Note (Signed)
Septic &/ cardiogenic shock -Lactic acid 3.9 on admission. Initially needed levophed but now weaned off Empiric Rocephin + zithromax although respi panel showing metapneumo virus + which is the likely etio

## 2023-01-09 NOTE — Assessment & Plan Note (Signed)
Outpt GI f/up, h/o ETOH use 

## 2023-01-09 NOTE — Progress Notes (Signed)
[facility] [address] [employer] [address] [facility]) Hospital Liaison Note  Received request from [place], [place] and Dr. Sherryll Burger for hospice services at home after discharge. Spoke with patient and daughters Corrine and [name] at bedside to initiate education related to hospice philosophy, services and team approach to care. Patient/family verbalized understanding of information given. Per discussion, the plan is for discharge home by private vehicle in the next 1-2 days.  Patient has walker, wheelchair, BSC and O2 in the home. No DME needs identified at this time.  Patient will be discharging to daughter [name]'s home at [address] [place] [address].  Please send completed and signed DNR with patient at discharge.  Please provide prescriptions at discharge as needed to ensure ongoing symptom management.  [facility] information and contact numbers given to daughters and patient.  Please call with any hospice related questions or concerns.  Thank you for the opportunity to participate in this patients care.  Haynes Bast, BSN, RN [facility] Liaison [phone]

## 2023-01-09 NOTE — Assessment & Plan Note (Signed)
Stage 3 s/p chemoradiation in 2016 

## 2023-01-09 NOTE — Progress Notes (Signed)
  Progress Note   Patient: Jenny Cooper ZOX:[id number] DOB: [birth year] DOA: 01/06/2023     2 DOS: the patient was seen and examined on 01/09/2023   Brief hospital course: 87 y.o. female  with past medical history of COPD with chronic respiratory failure requiring 4L [place] supplemental oxygen at baseline, paroxsymal atrial fibrillation on Eliquis, CAD s/p CABG x2, lung cancer (s/p chemoradiation) and HFrEF admitted for Acute on chronic hypoxic respi failure  5/10: admitted to ICU, Cardio c/s, echo 5/11: transfer to PCU & TRH. Palliative care c/s 5/12: Hospice evaluation  Assessment and Plan: Acute renal failure with acute tubular necrosis superimposed on stage 3b chronic kidney disease (HCC) Baseline creat 0.95, monitor - consider nephro c/s and renal US if no improvement/worsening  Lab Results  Component Value Date   CREATININE 1.55 (H) 01/09/2023   CREATININE 1.67 (H) 01/08/2023   CREATININE 1.62 (H) 01/07/2023     Shock (HCC) Septic &/ cardiogenic shock -Lactic acid 3.9 on admission. 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 Holding off diuretics due to AKI and  hypotension, echo pending        Subjective: Requiring 7 L oxygen and having conversational dyspnea.  2 daughters at bedside  Physical Exam: Vitals:   01/09/23 0730 01/09/23 0801 01/09/23 0935 01/09/23 1124  BP:   (!) 136/90 136/70  Pulse:   100 95  Resp:    18  Temp:    98 F (36.7 C)  TempSrc:      SpO2:  92%  95%  Weight: 64.2 kg     Height:       87 year old female lying in the bed comfortably without any acute distress Lungs decreased breath sounds at the bases, mild expiratory wheezing throughout both bases Heart irregularly irregular heart rate Abdomen soft, benign Neuro alert and awake, nonfocal Skin no rash or lesion Psych normal mood and affect Data Reviewed:  There are no new results to review at this time.  Family Communication: Updated 2 daughters at bedside  Disposition: Status is: Inpatient Remains inpatient appropriate because: Hospice evaluation  Planned Discharge Destination:  Home with hospice   DVT prophylaxis-Eliquis Time spent: 35 minutes  Author: Delfino Lovett, MD 01/09/2023 1:31 PM  For on call review [email or web address].

## 2023-01-09 NOTE — Progress Notes (Signed)
Daily Progress Note   Patient Name: Jenny Cooper       Date: 01/09/2023 DOB: 1936/08/23  Age: 87 y.o. MRN#: [id number] Attending Physician: Delfino Lovett, MD Primary Care Physician: Reubin Milan, MD Admit Date: 01/06/2023  Reason for Consultation/Follow-up: Establishing goals of care  HPI/Brief Hospital Review: 87 y.o. female  with past medical history of COPD with chronic respiratory failure requiring 4L [place] supplemental oxygen at baseline, paroxsymal atrial fibrillation on Eliquis, CAD s/p CABG x2, lung cancer (s/p chemoradiation) and HFrEF admitted from home on 01/06/2023 with [facility], increased oxygen requirements, productive cough and generalized weakness.    Recently suffered fall resulting in clavicle fracture-about 3 weeks prior to admission, has been living with her daughter during her recovery   Palliative medicine was consulted for assisting with goals of care conversations.  Subjective: Extensive chart review has been completed prior to meeting patient including labs, vital signs, imaging, progress notes, orders, and available advanced directive documents from current and previous encounters.  Connected earlier in AM with Dr. Sherryll Burger who shared he visited with Jenny Cooper and her daughters and they have decided on hospice services following in the home.  Visited with Jenny Cooper at her bedside. Daughter-Corrine present during time of visit. Corrine shares that she and her sister-[name] had further discussions after our conversations yesterday evening and they feel hospice services are most appropriate at this time. Jenny Cooper also shared her understanding and this to be her wishes.  Answered and addressed all questions and concerns. PMT to continue to follow for ongoing needs and  support.  Objective Vital Signs: BP 112/64 (BP Location: Left Arm)   Pulse 96   Temp 98.4 F (36.9 C) (Oral)   Resp 16   Ht 5\' 6"  (1.676 m)   Wt 64.2 kg   SpO2 93%   BMI 22.84 kg/m  SpO2: SpO2: 93 % O2 Device: O2 Device: Nasal Cannula O2 Flow Rate: O2 Flow Rate (L/min): 7 L/min   Palliative Care Assessment & Plan   Assessment/Recommendation/Plan  DNR Home with hospice-anticipate discharge 24-48 hours Hospice following PMT to continue to offer support  Care plan was discussed with Haynes Bast, RN-ACC  Thank you for allowing the Palliative Medicine Team to assist in the care of this patient.  Total time:  25 minutes  Time spent  includes: Detailed review of medical records (labs, imaging, vital signs), medically appropriate exam (mental status, respiratory, cardiac, skin), discussed with treatment team, counseling and educating patient, family and staff, documenting clinical information, medication management and coordination of care.  Leeanne Deed, DNP, AGNP-C Palliative Medicine   Please contact Palliative Medicine Team phone at [phone] for questions and concerns.

## 2023-01-09 NOTE — Assessment & Plan Note (Addendum)
Initially required HHFNC -currently needing 7 L oxygen via nasal cannula

## 2023-01-09 NOTE — Assessment & Plan Note (Signed)
Euvolemic, cardio c/s. Holding off diuretics due to AKI and hypotension, echo pending

## 2023-01-09 NOTE — IPAL (Signed)
  Interdisciplinary Goals of Care Family Meeting   Date carried out: 01/09/2023  Location of the meeting: Bedside  Member's involved: Physician and Family Member or next of kin  Durable Power of Attorney or Environmental health practitioner: Both daughters at bedside  Discussion: We discussed goals of care for Jenny Cooper   Code status:   Code Status: DNR   Disposition: Home with Hospice  Time spent for the meeting: 35 minutes    Delfino Lovett, MD  01/09/2023, 10:30 AM

## 2023-01-09 NOTE — TOC Progression Note (Signed)
Transition of Care [facility]) - Progression Note    Patient Details  Name: Jenny Cooper MRN: [id number] Date of Birth: [birth year]  Transition of Care [facility]) CM/SW Contact  Kemper Durie, RN Phone Number: 01/09/2023, 10:27 AM  Clinical Narrative:     Notified by MD that patient/family would like home hospice.  Spoke with Corrine, daughter, they are agreeable to [employer].  Misty notified, requested to speak with family at the bedside to discuss process.        Expected Discharge Plan and Services                                               Social Determinants of Health (SDOH) Interventions SDOH Screenings   Food Insecurity: No Food Insecurity (09/06/2022)  Housing: Low Risk  (09/06/2022)  Transportation Needs: No Transportation Needs (09/06/2022)  Utilities: Not At Risk (09/06/2022)  Alcohol Screen: Low Risk  (11/10/2021)  Depression (PHQ2-9): Low Risk  (09/06/2022)  Financial Resource Strain: Low Risk  (09/06/2022)  Physical Activity: Inactive (09/06/2022)  Social Connections: Moderately Isolated (09/06/2022)  Stress: No Stress Concern Present (09/06/2022)  Tobacco Use: Medium Risk (01/07/2023)    Readmission Risk Interventions    06/26/2021   10:45 AM  Readmission Risk Prevention Plan  Transportation Screening Complete  PCP or Specialist Appt within 3-5 Days Complete  HRI or Home Care Consult Complete  Social Work Consult for Recovery Care Planning/Counseling Complete  Palliative Care Screening Not Applicable  Medication Review Oceanographer) Complete

## 2023-01-09 NOTE — Assessment & Plan Note (Signed)
No chest pain, checking echo due to elevated troponin, no statin due to elevated LFTs, cardio following

## 2023-01-09 NOTE — Assessment & Plan Note (Signed)
Baseline creat 0.95, monitor - consider nephro c/s and renal US if no improvement/worsening  Lab Results  Component Value Date   CREATININE 1.55 (H) 01/09/2023   CREATININE 1.67 (H) 01/08/2023   CREATININE 1.62 (H) 01/07/2023

## 2023-01-10 ENCOUNTER — Ambulatory Visit: Payer: Medicare Other | Admitting: Family

## 2023-01-10 ENCOUNTER — Telehealth: Payer: Self-pay | Admitting: Internal Medicine

## 2023-01-10 DIAGNOSIS — Z7189 Other specified counseling: Secondary | ICD-10-CM | POA: Diagnosis not present

## 2023-01-10 DIAGNOSIS — Z515 Encounter for palliative care: Secondary | ICD-10-CM | POA: Diagnosis not present

## 2023-01-10 DIAGNOSIS — J9621 Acute and chronic respiratory failure with hypoxia: Secondary | ICD-10-CM | POA: Diagnosis not present

## 2023-01-10 DIAGNOSIS — J449 Chronic obstructive pulmonary disease, unspecified: Secondary | ICD-10-CM | POA: Diagnosis not present

## 2023-01-10 LAB — CBC
HCT: 37.9 % (ref 36.0–46.0)
Hemoglobin: 11.5 g/dL — ABNORMAL LOW (ref 12.0–15.0)
MCH: 30.4 pg (ref 26.0–34.0)
MCHC: 30.3 g/dL (ref 30.0–36.0)
MCV: 100.3 fL — ABNORMAL HIGH (ref 80.0–100.0)
Platelets: 175 10*3/uL (ref 150–400)
RBC: 3.78 MIL/uL — ABNORMAL LOW (ref 3.87–5.11)
RDW: 16.1 % — ABNORMAL HIGH (ref 11.5–15.5)
WBC: 4.4 10*3/uL (ref 4.0–10.5)
nRBC: 0 % (ref 0.0–0.2)

## 2023-01-10 LAB — RENAL FUNCTION PANEL
Albumin: 3.5 g/dL (ref 3.5–5.0)
Anion gap: 9 (ref 5–15)
BUN: 32 mg/dL — ABNORMAL HIGH (ref 8–23)
CO2: 29 mmol/L (ref 22–32)
Calcium: 8.5 mg/dL — ABNORMAL LOW (ref 8.9–10.3)
Chloride: 99 mmol/L (ref 98–111)
Creatinine, Ser: 1.16 mg/dL — ABNORMAL HIGH (ref 0.44–1.00)
GFR, Estimated: 46 mL/min — ABNORMAL LOW (ref >60)
Glucose, Bld: 143 mg/dL — ABNORMAL HIGH (ref 70–99)
Phosphorus: 3.3 mg/dL (ref 2.5–4.6)
Potassium: 4.5 mmol/L (ref 3.5–5.1)
Sodium: 137 mmol/L (ref 135–145)

## 2023-01-10 LAB — GLUCOSE, CAPILLARY
Glucose-Capillary: 138 mg/dL — ABNORMAL HIGH (ref 70–99)
Glucose-Capillary: 143 mg/dL — ABNORMAL HIGH (ref 70–99)
Glucose-Capillary: 143 mg/dL — ABNORMAL HIGH (ref 70–99)

## 2023-01-10 LAB — LEGIONELLA PNEUMOPHILA SEROGP 1 UR AG: L. pneumophila Serogp 1 Ur Ag: NEGATIVE

## 2023-01-10 LAB — CULTURE, BLOOD (ROUTINE X 2)
Culture: NO GROWTH
Special Requests: ADEQUATE

## 2023-01-10 MED ORDER — LORAZEPAM 0.5 MG PO TABS
0.5000 mg | ORAL_TABLET | ORAL | 0 refills | Status: DC | PRN
Start: 2023-01-10 — End: 2023-01-13

## 2023-01-10 MED ORDER — LORAZEPAM 0.5 MG PO TABS
0.5000 mg | ORAL_TABLET | ORAL | 0 refills | Status: DC | PRN
Start: 2023-01-10 — End: 2023-01-10

## 2023-01-10 MED ORDER — MORPHINE SULFATE (CONCENTRATE) 20 MG/ML PO SOLN
10.0000 mg | ORAL | 0 refills | Status: DC | PRN
Start: 2023-01-10 — End: 2023-01-10

## 2023-01-10 MED ORDER — MORPHINE SULFATE (CONCENTRATE) 20 MG/ML PO SOLN
10.0000 mg | ORAL | 0 refills | Status: DC | PRN
Start: 2023-01-10 — End: 2023-01-13

## 2023-01-11 LAB — CULTURE, BLOOD (ROUTINE X 2)

## 2023-01-12 LAB — CULTURE, BLOOD (ROUTINE X 2)
Culture: NO GROWTH
Special Requests: ADEQUATE

## 2023-01-13 ENCOUNTER — Telehealth: Payer: Self-pay | Admitting: Internal Medicine

## 2023-01-13 NOTE — Telephone Encounter (Signed)
Called and informed [facility] it is completed.  - Jenny Cooper

## 2023-01-13 NOTE — Telephone Encounter (Signed)
Copied from CRM [id number]. Topic: General - Other >> Jan 13, 2023  8:05 AM Dondra Prader A wrote: Reason for CRM: Bett with  [facility] states that she sent over the pt death certificate 3 days ago and she is needing the pt PCP to sign the death certificate and send it back over to her. Per Bett she is needing this done today because the pt is getting cremated and they have to have the death certificate signed before they can do the cremation. Please advise.

## 2023-01-29 NOTE — Telephone Encounter (Signed)
Called and spoke with Star who said will give this information to [facility]- Dr Judithann Graves does not serve at attending for hospice. - Jake Fuhrmann

## 2023-01-29 NOTE — TOC Transition Note (Signed)
Transition of Care [facility]) - CM/SW Discharge Note   Patient Details  Name: Jenny Cooper MRN: [id number] Date of Birth: 10/29/1935  [facility]) CM/SW Contact:  Allena Katz, LCSW Phone Number: 01/09/2023, 8:51 AM   Clinical Narrative:   Pt discharging home with [facility]. Pt has no DME needs per hospice. Family would like to transport home. CSW signing off.     Final next level of care: Home w Hospice Care Barriers to Discharge: Barriers Resolved   Patient Goals and CMS Choice CMS Medicare.gov Compare Post Acute Care list provided to:: Patient Choice offered to / list presented to : Patient  Discharge Placement                  Patient to be transferred to facility by: Family      Discharge Plan and Services Additional resources added to the After Visit Summary for                                       Social Determinants of Health (SDOH) Interventions SDOH Screenings   Food Insecurity: No Food Insecurity (09/06/2022)  Housing: Low Risk  (09/06/2022)  Transportation Needs: No Transportation Needs (09/06/2022)  Utilities: Not At Risk (09/06/2022)  Alcohol Screen: Low Risk  (11/10/2021)  Depression (PHQ2-9): Low Risk  (09/06/2022)  Financial Resource Strain: Low Risk  (09/06/2022)  Physical Activity: Inactive (09/06/2022)  Social Connections: Moderately Isolated (09/06/2022)  Stress: No Stress Concern Present (09/06/2022)  Tobacco Use: Medium Risk (01/07/2023)     Readmission Risk Interventions    06/26/2021   10:45 AM  Readmission Risk Prevention Plan  Transportation Screening Complete  PCP or Specialist Appt within 3-5 Days Complete  HRI or Home Care Consult Complete  Social Work Consult for Recovery Care Planning/Counseling Complete  Palliative Care Screening Not Applicable  Medication Review Oceanographer) Complete

## 2023-01-29 NOTE — Discharge Summary (Signed)
Physician Discharge Summary   Patient: Jenny Cooper MRN: [id number] DOB: 11-Dec-1935  Admit date:     01/06/2023  Discharge date: 01/16/2023  Discharge Physician: Delfino Lovett   PCP: Reubin Milan, MD   Recommendations at discharge:    Home with Hospice  Discharge Diagnoses: Active Problems:   Chronic HFrEF (heart failure with reduced ejection fraction) (HCC)   Stage 4 very severe COPD by GOLD classification (HCC)   Acute on chronic respiratory failure with hypoxia (HCC)   Coronary artery disease of bypass graft of native heart with stable angina pectoris (HCC)   Squamous cell lung cancer (HCC)   Sepsis (HCC)   Cirrhosis (HCC)   Shock (HCC)   Acute renal failure with acute tubular necrosis superimposed on stage 3b chronic kidney disease (HCC)   Goals of care, counseling/discussion  Hospital Course: 87 y.o. female  with past medical history of COPD with chronic respiratory failure requiring 4L [place] supplemental oxygen at baseline, paroxsymal atrial fibrillation on Eliquis, CAD s/p CABG x2, lung cancer (s/p chemoradiation) and HFrEF admitted for Acute on chronic hypoxic respi failure  5/10: admitted to ICU, Cardio c/s, echo 5/11: transfer to PCU & TRH. Palliative care c/s 5/12: Hospice evaluation  Assessment and Plan: Acute renal failure with acute tubular necrosis superimposed on stage 3b chronic kidney disease (HCC) Septic shock -Lactic acid 3.9 on admission. Initially needed levophed but now weaned off Empiric Rocephin + zithromax although respi panel showing metapneumo virus + which is the likely etio  Cirrhosis (HCC) Squamous cell lung cancer (HCC) Stage 3 s/p chemoradiation in 2016  Coronary artery disease of bypass graft of native heart with stable angina pectoris (HCC) Acute on chronic respiratory failure with hypoxia (HCC) Stage 4 very severe COPD by GOLD classification (HCC) Chronic HFrEF (heart failure with reduced ejection fraction) (HCC)           Consultants: PCCM, Palliative care Disposition: Hospice care Diet recommendation:  Discharge Diet Orders (From admission, onward)     Start     Ordered   01/25/2023 0000  Diet - low sodium heart healthy        01/27/2023 0753           Carb modified diet DISCHARGE MEDICATION: Allergies as of 12/29/2022       Reactions   Cefdinir Rash   Had hives after completing treatment - unsure if it was the cause. Has received 1st generation cephalosporin many times (04/24/2012, 07/27/2013, 09/28/2016, 02/20/2018, 07/11/2018, 08/10/2018 without documented ADRs.   Entresto [sacubitril-valsartan]    Hypotension   Lovenox [enoxaparin Sodium] Itching   Meperidine Other (See Comments)   Other Reaction: pt does not like how it makes her feel Other reaction(s): Other (See Comments) Other Reaction: pt does not like how it makes her feel        Medication List     TAKE these medications    budesonide 0.5 MG/2ML nebulizer solution Commonly known as: PULMICORT USE 1 AMPULE (2 ML )VIA NEBULIZER TWO TIMES DAILY   CENTRUM SILVER PO Take 1 tablet by mouth daily.   cetirizine 10 MG tablet Commonly known as: ZYRTEC TAKE (1) TABLET BY MOUTH EVERY DAY   cyanocobalamin 1000 MCG tablet Commonly known as: VITAMIN B12 Take 1,000 mcg by mouth daily.   Eliquis 5 MG Tabs tablet Generic drug: apixaban TAKE (1) TABLET BY MOUTH TWICE DAILY   furosemide 20 MG tablet Commonly known as: LASIX Take 2 tablets (40 mg total) by mouth daily.  ipratropium-albuterol 0.5-2.5 (3) MG/3ML Soln Commonly known as: DUONEB TAKE 3 MLS BY NEBULIZATION EVERY 6 HOURS AS NEEDED FOR WHEEZING OR SHORTNESS OF BREATH   levothyroxine 88 MCG tablet Commonly known as: SYNTHROID TAKE (1) TABLET BY MOUTH EVERY DAY BEFORE BREAKFAST   LORazepam 0.5 MG tablet Commonly known as: ATIVAN Take 1 tablet (0.5 mg total) by mouth every 4 (four) hours as needed for up to 3 days for anxiety, seizure, sleep or sedation. May crush,  mix with water and give sublingually if needed.   metoprolol succinate 100 MG 24 hr tablet Commonly known as: TOPROL-XL Take 1 tablet by mouth 2 (two) times daily.   morphine 20 MG/ML concentrated solution Commonly known as: ROXANOL Take 0.5 mLs (10 mg total) by mouth every 4 (four) hours as needed for up to 3 days for severe pain, anxiety, breakthrough pain or shortness of breath. May give sublingually if needed.   OXYGEN Inhale 4 L into the lungs.   potassium chloride 10 MEQ tablet Commonly known as: KLOR-CON TAKE (2) TABLETS BY MOUTH TWICE DAILY   rosuvastatin 5 MG tablet Commonly known as: CRESTOR TAKE (1) TABLET BY MOUTH EVERY DAY   Yupelri 175 MCG/3ML nebulizer solution Generic drug: revefenacin Use one vial in nebulizer once daily. Do not mix with other nebulized medications.        Discharge Exam: Filed Weights   01/06/23 2210 01/07/23 0430 01/09/23 0730  Weight: 64.3 kg 63.2 kg 55.28 kg   87 year old female lying in the bed comfortably without any acute distress Lungs decreased breath sounds at the bases, mild expiratory wheezing throughout both bases Heart irregularly irregular heart rate Abdomen soft, benign Neuro alert and awake, nonfocal Skin no rash or lesion Psych normal mood and affect  Condition at discharge: poor  The results of significant diagnostics from this hospitalization (including imaging, microbiology, ancillary and laboratory) are listed below for reference.   Imaging Studies: US Carotid Bilateral  Result Date: 01/08/2023 CLINICAL DATA:  Syncope and collapse. History of hypertension, CAD and hyperlipidemia. EXAM: BILATERAL CAROTID DUPLEX ULTRASOUND TECHNIQUE: Wallace Cullens scale imaging, color Doppler and duplex ultrasound were performed of bilateral carotid and vertebral arteries in the neck. COMPARISON:  None Available. FINDINGS: Criteria: Quantification of carotid stenosis is based on velocity parameters that correlate the residual internal  carotid diameter with NASCET-based stenosis levels, using the diameter of the distal internal carotid lumen as the denominator for stenosis measurement. The following velocity measurements were obtained: RIGHT ICA: 74/25 cm/sec CCA: 54/14 cm/sec SYSTOLIC ICA/CCA RATIO:  1.7 ECA: 52 cm/sec LEFT ICA: 62/23 cm/sec CCA: 61/15 cm/sec SYSTOLIC ICA/CCA RATIO:  1.0 ECA: 78 cm/sec RIGHT CAROTID ARTERY: There is a moderate-to-large amount of eccentric echogenic plaque within the right carotid bulb (image 15). There is a minimal amount of eccentric echogenic plaque involving the origin and proximal aspects of the right internal carotid artery (image 22), not resulting in elevated peak systolic velocities within the interrogated course of the right internal carotid artery to suggest a hemodynamically significant stenosis. RIGHT VERTEBRAL ARTERY:  Antegrade flow LEFT CAROTID ARTERY: There is a minimal amount of eccentric echogenic plaque scattered throughout the left common carotid artery (representative images 39 and 43). There is a minimal amount of eccentric echogenic plaque within left carotid bulb (image 47), extending to involve the origin and proximal aspects of the left internal carotid artery (image 54), not resulting in elevated peak systolic velocities within the interrogated course of the left internal carotid artery to suggest a hemodynamically significant  stenosis. LEFT VERTEBRAL ARTERY:  Antegrade flow IMPRESSION: 1. Moderate-to-large amount of right-sided atherosclerotic plaque, not resulting in a hemodynamically significant stenosis. 2. Minimal amount of left-sided atherosclerotic plaque, not resulting in a hemodynamically significant stenosis. Electronically Signed   By: Simonne Come M.D.   On: 01/08/2023 12:23   DG Chest Port 1 View  Result Date: 01/07/2023 CLINICAL DATA:  Difficulty breathing. EXAM: PORTABLE CHEST 1 VIEW COMPARISON:  Jan 06, 2023 FINDINGS: There is stable right-sided venous Port-A-Cath and  multi lead AICD positioning. Multiple sternal wires are present. There is mild, stable enlargement of the cardiac silhouette with an artificial mitral valve. Mild prominence of the perihilar pulmonary vasculature is seen which is mildly increased in severity when compared to the prior exam. Mild to moderate severity emphysematous lung disease is noted with mild, diffuse, chronic appearing increased interstitial lung markings. Stable chronic and post therapeutic changes are seen within the mid right lung, with additional chronic changes noted within the bilateral lung bases. A mild amount of superimposed bibasilar airspace disease is suspected. Stable right apical and lateral right basal pleural thickening is present. There is no evidence of a pleural effusion or pneumothorax. Prior vertebroplasty is seen within the lower thoracic and upper lumbar spine. A right shoulder replacement is also noted. IMPRESSION: 1. Mild to moderate severity emphysematous lung disease with chronic and post therapeutic changes within the mid right lung and bilateral lung bases. 2. Mild amount of superimposed bibasilar airspace disease. 3. Stable cardiomegaly with mild pulmonary vascular congestion. Electronically Signed   By: Aram Candela M.D.   On: 01/07/2023 22:14   ECHOCARDIOGRAM COMPLETE  Result Date: 01/07/2023    ECHOCARDIOGRAM REPORT   Patient Name:   Jenny Cooper Date of Exam: 01/07/2023 Medical Rec #:  [id number]        Height:       66.0 in Accession #:    [id number]       Weight:       139.3 lb Date of Birth:  05-24-1936        BSA:          1.715 m Patient Age:    86 years         BP:           112/96 mmHg Patient Gender: F                HR:           61 bpm. Exam Location:  [facility] Procedure: 2D Echo, Cardiac Doppler, Color Doppler and Intracardiac            Opacification Agent Indications:     Cardiomyopathy  History:         Patient has prior history of Echocardiogram examinations, most                  recent  06/24/2021. Cardiomyopathy and CHF, CAD and Previous                  Myocardial Infarction, Pacemaker and Prior CABG, Pulmonary HTN                  and COPD, Arrythmias:Atrial Flutter and Tachycardia; Risk                  Factors:Hypertension, Diabetes and Dyslipidemia.  Sonographer:     Mikki Harbor Referring Phys:  [id number] BRITTON L [place] Diagnosing Phys: Julien Nordmann MD  Sonographer Comments: Technically difficult study due to poor  echo windows and suboptimal apical window. Image acquisition challenging due to COPD. IMPRESSIONS  1. Very challenging images  2. Left ventricle not well visualied, grossly Left ventricular ejection fraction, by estimation, is 45 to 50%. Left ventricular ejection fraction by PLAX is 51 %. The left ventricle has mildly decreased function. The left ventricle demonstrates global hypokinesis.  3. Right ventricle not well visualized, grossly Right ventricular systolic function is severely reduced. The right ventricular size is severely enlarged. There is severely elevated pulmonary artery systolic pressure. The estimated right ventricular systolic pressure is 61.8 mmHg.  4. The mitral valve is normal in structure. No evidence of mitral valve regurgitation. No evidence of mitral stenosis.  5. The aortic valve is normal in structure. Aortic valve regurgitation is not visualized. Aortic valve sclerosis is present, with no evidence of aortic valve stenosis.  6. The inferior vena cava is dilated in size with <50% respiratory variability, suggesting right atrial pressure of 15 mmHg. FINDINGS  Left Ventricle: Left ventricular ejection fraction, by estimation, is 45 to 50%. Left ventricular ejection fraction by PLAX is 51 %. The left ventricle has mildly decreased function. The left ventricle demonstrates global hypokinesis. Definity contrast agent was given IV to delineate the left ventricular endocardial borders. The left ventricular internal cavity size was normal in size. There  is no left ventricular hypertrophy. Left ventricular diastolic parameters are indeterminate. Right Ventricle: The right ventricular size is severely enlarged. No increase in right ventricular wall thickness. Right ventricular systolic function is severely reduced. There is severely elevated pulmonary artery systolic pressure. The tricuspid regurgitant velocity is 3.42 m/s, and with an assumed right atrial pressure of 15 mmHg, the estimated right ventricular systolic pressure is 61.8 mmHg. Left Atrium: Left atrial size was normal in size. Right Atrium: Right atrial size was normal in size. Pericardium: There is no evidence of pericardial effusion. Mitral Valve: The mitral valve is normal in structure. No evidence of mitral valve regurgitation. No evidence of mitral valve stenosis. MV peak gradient, 5.5 mmHg. The mean mitral valve gradient is 2.0 mmHg. Tricuspid Valve: The tricuspid valve is normal in structure. Tricuspid valve regurgitation is not demonstrated. No evidence of tricuspid stenosis. Aortic Valve: The aortic valve is normal in structure. Aortic valve regurgitation is not visualized. Aortic valve sclerosis is present, with no evidence of aortic valve stenosis. Aortic valve mean gradient measures 2.0 mmHg. Aortic valve peak gradient measures 4.7 mmHg. Aortic valve area, by VTI measures 1.48 cm. Pulmonic Valve: The pulmonic valve was normal in structure. Pulmonic valve regurgitation is not visualized. No evidence of pulmonic stenosis. Aorta: The aortic root is normal in size and structure. Venous: The inferior vena cava is dilated in size with less than 50% respiratory variability, suggesting right atrial pressure of 15 mmHg. IAS/Shunts: No atrial level shunt detected by color flow Doppler.  LEFT VENTRICLE PLAX 2D LV EF:         Left ventricular ejection fraction by PLAX is 51 %. LVIDd:         3.90 cm LVIDs:         2.90 cm LV PW:         0.90 cm LV IVS:        1.00 cm LVOT diam:     2.00 cm LV SV:          34 LV SV Index:   20 LVOT Area:     3.14 cm  RIGHT VENTRICLE RV Basal diam:  5.30 cm RV Mid diam:  5.30 cm LEFT ATRIUM           Index        RIGHT ATRIUM           Index LA diam:      3.90 cm 2.27 cm/m   RA Area:     23.50 cm LA Vol (A4C): 51.9 ml 30.26 ml/m  RA Volume:   74.90 ml  43.67 ml/m  AORTIC VALVE AV Area (Vmax):    1.77 cm AV Area (Vmean):   1.50 cm AV Area (VTI):     1.48 cm AV Vmax:           108.00 cm/s AV Vmean:          66.900 cm/s AV VTI:            0.230 m AV Peak Grad:      4.7 mmHg AV Mean Grad:      2.0 mmHg LVOT Vmax:         60.70 cm/s LVOT Vmean:        31.900 cm/s LVOT VTI:          0.108 m LVOT/AV VTI ratio: 0.47  AORTA Ao Root diam: 2.80 cm Ao Asc diam:  3.10 cm MITRAL VALVE                TRICUSPID VALVE MV Area (PHT): 3.33 cm     TR Peak grad:   46.8 mmHg MV Area VTI:   0.98 cm     TR Vmax:        342.00 cm/s MV Peak grad:  5.5 mmHg MV Mean grad:  2.0 mmHg     SHUNTS MV Vmax:       1.17 m/s     Systemic VTI:  0.11 m MV Vmean:      59.9 cm/s    Systemic Diam: 2.00 cm MV Decel Time: 228 msec MV E velocity: 109.00 cm/s Julien Nordmann MD Electronically signed by Julien Nordmann MD Signature Date/Time: 01/07/2023/5:50:43 PM    Final    CT Angio Chest PE W and/or Wo Contrast  Result Date: 01/07/2023 CLINICAL DATA:  Cough and shortness of breath. EXAM: CT ANGIOGRAPHY CHEST WITH CONTRAST TECHNIQUE: Multidetector CT imaging of the chest was performed using the standard protocol during bolus administration of intravenous contrast. Multiplanar CT image reconstructions and MIPs were obtained to evaluate the vascular anatomy. RADIATION DOSE REDUCTION: This exam was performed according to the departmental dose-optimization program which includes automated exposure control, adjustment of the mA and/or kV according to patient size and/or use of iterative reconstruction technique. CONTRAST:  60mL OMNIPAQUE IOHEXOL 350 MG/ML SOLN COMPARISON:  June 28, 2021 FINDINGS: Cardiovascular: A  properly positioned right-sided venous Port-A-Cath is seen. There is mild to moderate severity calcification of the thoracic aorta. Satisfactory opacification of the pulmonary arteries to the segmental level. No evidence of pulmonary embolism. There is mild cardiomegaly with moderate severity coronary artery calcification. An artificial mitral valve is seen. There is moderate severity pericardial calcification without evidence of an associated pericardial effusion. Mediastinum/Nodes: Mild, predominant stable AP window lymphadenopathy is present. Thyroid gland, trachea, and esophagus demonstrate no significant findings. Lungs/Pleura: There is marked severity centrilobular emphysematous lung disease. Stable, moderate to marked severity right perihilar scarring and post treatment changes are again seen. Right-sided volume loss is also present. Moderate severity right upper lobe, moderate severity right basilar, mild left upper lobe and mild left basilar scarring and/or atelectasis is also noted. Mild posterolateral left lower  lobe and mild superimposed right basilar airspace disease is seen. Left no pleural effusion or pneumothorax is identified. Upper Abdomen: The liver is cirrhotic in appearance. Musculoskeletal: Multiple sternal wires are seen. Multilevel degenerative changes are seen throughout the thoracic spine with evidence of prior lower thoracic spine vertebroplasty. Review of the MIP images confirms the above findings. IMPRESSION: 1. No evidence of pulmonary embolism. 2. Mild posterolateral left lower lobe and mild right basilar airspace disease. 3. Stable, moderate to marked severity right perihilar scarring and post treatment changes. 4. Marked severity centrilobular emphysematous lung disease. 5. Cardiomegaly with moderate severity coronary artery calcification. 6. Cirrhosis. 7. Aortic atherosclerosis. Aortic Atherosclerosis (ICD10-I70.0) and Emphysema (ICD10-J43.9). Electronically Signed   By: Aram Candela M.D.   On: 01/07/2023 01:57   DG Chest Portable 1 View  Result Date: 01/06/2023 CLINICAL DATA:  Cough and shortness of breath. EXAM: PORTABLE CHEST 1 VIEW COMPARISON:  March 05, 2022 FINDINGS: There is stable dual lead AICD and right-sided venous Port-A-Cath positioning. Multiple sternal wires are noted. An artificial mitral valve is in place. There is mild, stable enlargement of the cardiac silhouette. Stable right-sided volume loss is seen with diffuse, chronic appearing increased interstitial lung markings. Chronic and post therapeutic changes are also seen within the mid right lung and bilateral lung bases. There is no evidence of a pleural effusion or pneumothorax. A right shoulder replacement is seen. Evidence of prior vertebroplasty is noted within the lower thoracic spine. IMPRESSION: 1. Chronic appearing increased interstitial lung markings with chronic and post therapeutic changes within the mid right lung and bilateral lung bases. 2. A mild component of superimposed airspace disease within the bilateral lung bases cannot be excluded. Electronically Signed   By: Aram Candela M.D.   On: 01/06/2023 22:35    Microbiology: Results for orders placed or performed during the hospital encounter of 01/06/23  Resp panel by RT-PCR (RSV, Flu A&B, Covid) Anterior Nasal Swab     Status: None   Collection Time: 01/06/23 10:18 PM   Specimen: Anterior Nasal Swab  Result Value Ref Range Status   SARS Coronavirus 2 by RT PCR NEGATIVE NEGATIVE Final    Comment: (NOTE) SARS-CoV-2 target nucleic acids are NOT DETECTED.  The SARS-CoV-2 RNA is generally detectable in upper respiratory specimens during the acute phase of infection. The lowest concentration of SARS-CoV-2 viral copies this assay can detect is 138 copies/mL. A negative result does not preclude SARS-Cov-2 infection and should not be used as the sole basis for treatment or other patient management decisions. A negative result may occur  with  improper specimen collection/handling, submission of specimen other than nasopharyngeal swab, presence of viral mutation(s) within the areas targeted by this assay, and inadequate number of viral copies(<138 copies/mL). A negative result must be combined with clinical observations, patient history, and epidemiological information. The expected result is Negative.  Fact Sheet for Patients:  BloggerCourse.com  Fact Sheet for Healthcare Providers:  SeriousBroker.it  This test is no t yet approved or cleared by the [place] FDA and  has been authorized for detection and/or diagnosis of SARS-CoV-2 by FDA under an Emergency Use Authorization (EUA). This EUA will remain  in effect (meaning this test can be used) for the duration of the COVID-19 declaration under Section 564(b)(1) of the Act, 21 U.S.C.section 360bbb-3(b)(1), unless the authorization is terminated  or revoked sooner.       Influenza A by PCR NEGATIVE NEGATIVE Final   Influenza B by PCR NEGATIVE NEGATIVE Final  Comment: (NOTE) The Xpert Xpress SARS-CoV-2/FLU/RSV plus assay is intended as an aid in the diagnosis of influenza from Nasopharyngeal swab specimens and should not be used as a sole basis for treatment. Nasal washings and aspirates are unacceptable for Xpert Xpress SARS-CoV-2/FLU/RSV testing.  Fact Sheet for Patients: BloggerCourse.com  Fact Sheet for Healthcare Providers: SeriousBroker.it  This test is not yet approved or cleared by the [place] FDA and has been authorized for detection and/or diagnosis of SARS-CoV-2 by FDA under an Emergency Use Authorization (EUA). This EUA will remain in effect (meaning this test can be used) for the duration of the COVID-19 declaration under Section 564(b)(1) of the Act, 21 U.S.C. section 360bbb-3(b)(1), unless the authorization is terminated  or revoked.     Resp Syncytial Virus by PCR NEGATIVE NEGATIVE Final    Comment: (NOTE) Fact Sheet for Patients: BloggerCourse.com  Fact Sheet for Healthcare Providers: SeriousBroker.it  This test is not yet approved or cleared by the [place] FDA and has been authorized for detection and/or diagnosis of SARS-CoV-2 by FDA under an Emergency Use Authorization (EUA). This EUA will remain in effect (meaning this test can be used) for the duration of the COVID-19 declaration under Section 564(b)(1) of the Act, 21 U.S.C. section 360bbb-3(b)(1), unless the authorization is terminated or revoked.  Performed at [facility], [address] [place], [place] [address]   Blood culture (routine x 2)     Status: None (Preliminary result)   Collection Time: 01/07/23  2:02 AM   Specimen: BLOOD LEFT FOREARM  Result Value Ref Range Status   Specimen Description BLOOD LEFT FOREARM  Final   Special Requests   Final    BOTTLES DRAWN AEROBIC AND ANAEROBIC Blood Culture adequate volume   Culture   Final    NO GROWTH 3 DAYS Performed at [facility], [address] [place], [place] [address]    Report Status PENDING  Incomplete  Blood culture (routine x 2)     Status: None (Preliminary result)   Collection Time: 01/07/23  5:12 AM   Specimen: BLOOD  Result Value Ref Range Status   Specimen Description BLOOD  RIGHT HAND  Final   Special Requests   Final    BOTTLES DRAWN AEROBIC AND ANAEROBIC Blood Culture adequate volume   Culture   Final    NO GROWTH 3 DAYS Performed at [facility], [address] [place], [place] [address]    Report Status PENDING  Incomplete  Respiratory (~20 pathogens) panel by PCR     Status: Abnormal   Collection Time: 01/07/23 10:19 PM   Specimen: Nasopharyngeal Swab; Respiratory  Result Value Ref Range Status   Adenovirus NOT DETECTED NOT DETECTED Final   Coronavirus 229E NOT  DETECTED NOT DETECTED Final    Comment: (NOTE) The Coronavirus on the Respiratory Panel, DOES NOT test for the novel  Coronavirus (2019 nCoV)    Coronavirus HKU1 NOT DETECTED NOT DETECTED Final   Coronavirus NL63 NOT DETECTED NOT DETECTED Final   Coronavirus OC43 NOT DETECTED NOT DETECTED Final   Metapneumovirus DETECTED (A) NOT DETECTED Final   Rhinovirus / Enterovirus NOT DETECTED NOT DETECTED Final   Influenza A NOT DETECTED NOT DETECTED Final   Influenza B NOT DETECTED NOT DETECTED Final   Parainfluenza Virus 1 NOT DETECTED NOT DETECTED Final   Parainfluenza Virus 2 NOT DETECTED NOT DETECTED Final   Parainfluenza Virus 3 NOT DETECTED NOT DETECTED Final   Parainfluenza Virus 4 NOT DETECTED NOT DETECTED  Final   Respiratory Syncytial Virus NOT DETECTED NOT DETECTED Final   Bordetella pertussis NOT DETECTED NOT DETECTED Final   Bordetella Parapertussis NOT DETECTED NOT DETECTED Final   Chlamydophila pneumoniae NOT DETECTED NOT DETECTED Final   Mycoplasma pneumoniae NOT DETECTED NOT DETECTED Final    Comment: Performed at [facility], [address] [place], [place] [address]    Labs: CBC: Recent Labs  Lab 01/06/23 2218 01/07/23 0512 01/08/23 0456 01/09/23 0506 01/26/2023 0600  WBC 5.4 4.9 5.1 5.9 4.4  NEUTROABS 3.5  --   --   --   --   HGB 12.3 11.8* 11.1* 11.1* 11.5*  HCT 41.6 38.7 36.5 36.6 37.9  MCV 101.0* 98.7 98.6 99.2 100.3*  PLT 198 174 167 173 175   Basic Metabolic Panel: Recent Labs  Lab 01/07/23 0512 01/07/23 0838 01/07/23 1729 01/07/23 1948 01/08/23 0456 01/09/23 0506 01/09/2023 0600  NA 137  --  136  --  136 136 137  K 5.8*   < > 5.3* 5.1 4.5 4.2 4.5  CL 102  --  99  --  103 101 99  CO2 24  --  21*  --  26 28 29   GLUCOSE 172*  --  167*  --  126* 114* 143*  BUN 37*  --  40*  --  43* 39* 32*  CREATININE 1.64*  --  1.62*  --  1.67* 1.55* 1.16*  CALCIUM 8.9  --  9.0  --  8.7* 8.6* 8.5*  MG 2.3  --   --   --  2.4  --   --   PHOS 5.5*  --   --   --   5.5* 4.0 3.3   < > = values in this interval not displayed.   Liver Function Tests: Recent Labs  Lab 01/06/23 2218 01/07/23 0512 01/08/23 0456 01/09/23 0506 01/02/2023 0600  AST 44* 42*  --   --   --   ALT 22 22  --   --   --   ALKPHOS 156* 129*  --   --   --   BILITOT 1.3* 1.6*  --   --   --   PROT 8.7* 7.6  --   --   --   ALBUMIN 4.1 3.6 3.5 3.4* 3.5   CBG: Recent Labs  Lab 01/09/23 1520 01/09/23 2055 01/18/2023 0007 12/31/2022 0431 01/04/2023 0809  GLUCAP 139* 150* 138* 143* 143*    Discharge time spent: greater than 30 minutes.  Signed: Delfino Lovett, MD [facility] 01/05/2023

## 2023-01-29 NOTE — Progress Notes (Signed)
Pt is for transfer to 1C (room 115). Informed pt and daughter Mickey Farber) regarding transfer. Amenable. Report called to 1C RN. Pt is currently resting, no complaints as of the moment. Stable vital signs.

## 2023-01-29 NOTE — Progress Notes (Signed)
Patient being discharged home with Jenny Cooper. PIVs removed. Patient will being discharging with her Foley intact per MD. [name] over discharge instructions with the patient and the patients daughter. All questions were answered and they stated that they understood. Patient will be going home via POV with daughter [name]).

## 2023-01-29 NOTE — Telephone Encounter (Signed)
Copied from CRM [id number]. Topic: General - Inquiry >> Jan 10, 2023 10:11 AM De Blanch wrote: Reason for [id number] from [facility] stated received a hospice referral. The family is requesting that Dr. Judithann Graves serve as attending. Shery asked, Does Dr. Judithann Graves feel the patient has 6 months or less. If she does decide she wants to serve as attending she can defer comfort orders back to hospice.  Please advise.

## 2023-01-29 NOTE — Care Management Important Message (Signed)
Important Message  Patient Details  Name: Jenny Cooper MRN: [id number] Date of Birth: 1935-09-26   Medicare Important Message Given:  N/A - LOS <3 / Initial given by admissions     Olegario Messier A Tiffini Blacksher 01/11/2023, 7:16 AM

## 2023-01-29 DEATH — deceased

## 2023-02-08 ENCOUNTER — Ambulatory Visit: Payer: Medicare Other | Admitting: Internal Medicine

## 2023-03-21 ENCOUNTER — Ambulatory Visit: Payer: Medicare Other

## 2023-03-24 ENCOUNTER — Ambulatory Visit: Payer: Medicare Other | Admitting: Internal Medicine

## 2023-03-24 ENCOUNTER — Other Ambulatory Visit: Payer: Medicare Other
